# Patient Record
Sex: Female | Born: 1950 | Race: Black or African American | Hispanic: No | Smoking: Former smoker
Health system: Southern US, Community
[De-identification: ages and names within clinical notes are randomized; demographics above are authoritative.]

## PROBLEM LIST (undated history)

## (undated) DIAGNOSIS — E039 Hypothyroidism, unspecified: Secondary | ICD-10-CM

## (undated) DIAGNOSIS — Z9484 Stem cells transplant status: Secondary | ICD-10-CM

## (undated) DIAGNOSIS — T451X5A Adverse effect of antineoplastic and immunosuppressive drugs, initial encounter: Secondary | ICD-10-CM

## (undated) DIAGNOSIS — Z8489 Family history of other specified conditions: Secondary | ICD-10-CM

## (undated) DIAGNOSIS — J189 Pneumonia, unspecified organism: Secondary | ICD-10-CM

## (undated) DIAGNOSIS — K219 Gastro-esophageal reflux disease without esophagitis: Secondary | ICD-10-CM

## (undated) DIAGNOSIS — Z828 Family history of other disabilities and chronic diseases leading to disablement, not elsewhere classified: Secondary | ICD-10-CM

## (undated) DIAGNOSIS — C7951 Secondary malignant neoplasm of bone: Secondary | ICD-10-CM

## (undated) DIAGNOSIS — D6481 Anemia due to antineoplastic chemotherapy: Secondary | ICD-10-CM

## (undated) DIAGNOSIS — IMO0002 Reserved for concepts with insufficient information to code with codable children: Secondary | ICD-10-CM

## (undated) DIAGNOSIS — Z923 Personal history of irradiation: Secondary | ICD-10-CM

## (undated) DIAGNOSIS — E7800 Pure hypercholesterolemia, unspecified: Secondary | ICD-10-CM

## (undated) DIAGNOSIS — I639 Cerebral infarction, unspecified: Secondary | ICD-10-CM

## (undated) DIAGNOSIS — Z9889 Other specified postprocedural states: Secondary | ICD-10-CM

## (undated) DIAGNOSIS — C9000 Multiple myeloma not having achieved remission: Secondary | ICD-10-CM

## (undated) DIAGNOSIS — R112 Nausea with vomiting, unspecified: Secondary | ICD-10-CM

## (undated) DIAGNOSIS — G4700 Insomnia, unspecified: Secondary | ICD-10-CM

## (undated) HISTORY — DX: Adverse effect of antineoplastic and immunosuppressive drugs, initial encounter: T45.1X5A

## (undated) HISTORY — DX: Gastro-esophageal reflux disease without esophagitis: K21.9

## (undated) HISTORY — DX: Insomnia, unspecified: G47.00

## (undated) HISTORY — DX: Reserved for concepts with insufficient information to code with codable children: IMO0002

## (undated) HISTORY — DX: Personal history of irradiation: Z92.3

## (undated) HISTORY — DX: Family history of other disabilities and chronic diseases leading to disablement, not elsewhere classified: Z82.8

## (undated) HISTORY — DX: Pure hypercholesterolemia, unspecified: E78.00

## (undated) HISTORY — DX: Family history of other specified conditions: Z84.89

## (undated) HISTORY — DX: Anemia due to antineoplastic chemotherapy: D64.81

## (undated) HISTORY — DX: Hypothyroidism, unspecified: E03.9

## (undated) HISTORY — DX: Stem cells transplant status: Z94.84

## (undated) HISTORY — PX: KNEE ARTHROSCOPY: SHX127

---

## 1979-03-02 HISTORY — PX: VAGINAL HYSTERECTOMY: SUR661

## 1979-03-02 HISTORY — PX: CHOLECYSTECTOMY: SHX55

## 1997-08-03 ENCOUNTER — Ambulatory Visit (HOSPITAL_COMMUNITY): Admission: RE | Admit: 1997-08-03 | Discharge: 1997-08-03 | Payer: Self-pay | Admitting: *Deleted

## 1998-01-05 ENCOUNTER — Ambulatory Visit (HOSPITAL_BASED_OUTPATIENT_CLINIC_OR_DEPARTMENT_OTHER): Admission: RE | Admit: 1998-01-05 | Discharge: 1998-01-05 | Payer: Self-pay | Admitting: Orthopaedic Surgery

## 1998-04-04 ENCOUNTER — Ambulatory Visit (HOSPITAL_COMMUNITY): Admission: RE | Admit: 1998-04-04 | Discharge: 1998-04-04 | Payer: Self-pay | Admitting: *Deleted

## 1998-09-14 ENCOUNTER — Other Ambulatory Visit: Admission: RE | Admit: 1998-09-14 | Discharge: 1998-09-14 | Payer: Self-pay | Admitting: *Deleted

## 1998-11-09 ENCOUNTER — Ambulatory Visit (HOSPITAL_COMMUNITY): Admission: RE | Admit: 1998-11-09 | Discharge: 1998-11-09 | Payer: Self-pay | Admitting: *Deleted

## 1998-12-14 ENCOUNTER — Ambulatory Visit (HOSPITAL_BASED_OUTPATIENT_CLINIC_OR_DEPARTMENT_OTHER): Admission: RE | Admit: 1998-12-14 | Discharge: 1998-12-14 | Payer: Self-pay | Admitting: Orthopaedic Surgery

## 1999-11-27 ENCOUNTER — Ambulatory Visit (HOSPITAL_COMMUNITY): Admission: RE | Admit: 1999-11-27 | Discharge: 1999-11-27 | Payer: Self-pay | Admitting: *Deleted

## 1999-12-23 ENCOUNTER — Emergency Department (HOSPITAL_COMMUNITY): Admission: EM | Admit: 1999-12-23 | Discharge: 1999-12-23 | Payer: Self-pay | Admitting: Emergency Medicine

## 2000-08-20 ENCOUNTER — Other Ambulatory Visit: Admission: RE | Admit: 2000-08-20 | Discharge: 2000-08-20 | Payer: Self-pay | Admitting: Internal Medicine

## 2000-08-21 ENCOUNTER — Encounter: Admission: RE | Admit: 2000-08-21 | Discharge: 2000-08-21 | Payer: Self-pay | Admitting: Internal Medicine

## 2000-08-21 ENCOUNTER — Encounter: Payer: Self-pay | Admitting: Internal Medicine

## 2000-09-29 ENCOUNTER — Encounter: Payer: Self-pay | Admitting: Internal Medicine

## 2000-09-29 ENCOUNTER — Ambulatory Visit (HOSPITAL_COMMUNITY): Admission: RE | Admit: 2000-09-29 | Discharge: 2000-09-29 | Payer: Self-pay | Admitting: Internal Medicine

## 2001-03-11 ENCOUNTER — Encounter: Admission: RE | Admit: 2001-03-11 | Discharge: 2001-03-11 | Payer: Self-pay | Admitting: Internal Medicine

## 2001-03-24 ENCOUNTER — Ambulatory Visit (HOSPITAL_BASED_OUTPATIENT_CLINIC_OR_DEPARTMENT_OTHER): Admission: RE | Admit: 2001-03-24 | Discharge: 2001-03-24 | Payer: Self-pay | Admitting: Orthopedic Surgery

## 2001-10-01 ENCOUNTER — Encounter: Payer: Self-pay | Admitting: Internal Medicine

## 2001-10-01 ENCOUNTER — Ambulatory Visit (HOSPITAL_COMMUNITY): Admission: RE | Admit: 2001-10-01 | Discharge: 2001-10-01 | Payer: Self-pay | Admitting: Internal Medicine

## 2001-10-10 ENCOUNTER — Emergency Department (HOSPITAL_COMMUNITY): Admission: EM | Admit: 2001-10-10 | Discharge: 2001-10-10 | Payer: Self-pay | Admitting: Emergency Medicine

## 2001-10-10 ENCOUNTER — Encounter: Payer: Self-pay | Admitting: Emergency Medicine

## 2002-11-30 ENCOUNTER — Ambulatory Visit (HOSPITAL_COMMUNITY): Admission: RE | Admit: 2002-11-30 | Discharge: 2002-11-30 | Payer: Self-pay | Admitting: Internal Medicine

## 2002-11-30 ENCOUNTER — Encounter: Payer: Self-pay | Admitting: Internal Medicine

## 2003-11-29 ENCOUNTER — Other Ambulatory Visit: Admission: RE | Admit: 2003-11-29 | Discharge: 2003-11-29 | Payer: Self-pay | Admitting: Internal Medicine

## 2003-12-07 ENCOUNTER — Ambulatory Visit (HOSPITAL_COMMUNITY): Admission: RE | Admit: 2003-12-07 | Discharge: 2003-12-07 | Payer: Self-pay | Admitting: Internal Medicine

## 2005-04-17 ENCOUNTER — Encounter (INDEPENDENT_AMBULATORY_CARE_PROVIDER_SITE_OTHER): Payer: Self-pay | Admitting: *Deleted

## 2005-04-17 ENCOUNTER — Ambulatory Visit (HOSPITAL_COMMUNITY): Admission: RE | Admit: 2005-04-17 | Discharge: 2005-04-17 | Payer: Self-pay | Admitting: Gastroenterology

## 2005-05-28 ENCOUNTER — Ambulatory Visit (HOSPITAL_COMMUNITY): Admission: RE | Admit: 2005-05-28 | Discharge: 2005-05-28 | Payer: Self-pay | Admitting: Internal Medicine

## 2005-08-24 ENCOUNTER — Emergency Department (HOSPITAL_COMMUNITY): Admission: EM | Admit: 2005-08-24 | Discharge: 2005-08-24 | Payer: Self-pay | Admitting: Family Medicine

## 2006-06-06 ENCOUNTER — Ambulatory Visit (HOSPITAL_COMMUNITY): Admission: RE | Admit: 2006-06-06 | Discharge: 2006-06-06 | Payer: Self-pay | Admitting: Internal Medicine

## 2006-09-22 ENCOUNTER — Ambulatory Visit (HOSPITAL_COMMUNITY): Admission: RE | Admit: 2006-09-22 | Discharge: 2006-09-22 | Payer: Self-pay | Admitting: Internal Medicine

## 2007-01-12 ENCOUNTER — Other Ambulatory Visit: Admission: RE | Admit: 2007-01-12 | Discharge: 2007-01-12 | Payer: Self-pay | Admitting: Internal Medicine

## 2007-02-08 ENCOUNTER — Emergency Department (HOSPITAL_COMMUNITY): Admission: EM | Admit: 2007-02-08 | Discharge: 2007-02-08 | Payer: Self-pay | Admitting: Emergency Medicine

## 2007-03-07 ENCOUNTER — Emergency Department (HOSPITAL_COMMUNITY): Admission: EM | Admit: 2007-03-07 | Discharge: 2007-03-07 | Payer: Self-pay | Admitting: Emergency Medicine

## 2007-04-08 DIAGNOSIS — IMO0002 Reserved for concepts with insufficient information to code with codable children: Secondary | ICD-10-CM

## 2007-04-08 HISTORY — DX: Reserved for concepts with insufficient information to code with codable children: IMO0002

## 2007-04-12 ENCOUNTER — Emergency Department (HOSPITAL_COMMUNITY): Admission: EM | Admit: 2007-04-12 | Discharge: 2007-04-12 | Payer: Self-pay | Admitting: Emergency Medicine

## 2007-04-12 ENCOUNTER — Emergency Department (HOSPITAL_COMMUNITY): Admission: EM | Admit: 2007-04-12 | Discharge: 2007-04-13 | Payer: Self-pay | Admitting: Emergency Medicine

## 2007-04-14 ENCOUNTER — Ambulatory Visit: Payer: Self-pay | Admitting: Internal Medicine

## 2007-04-15 LAB — MORPHOLOGY

## 2007-04-15 LAB — CBC WITH DIFFERENTIAL/PLATELET
BASO%: 0.5 % (ref 0.0–2.0)
Basophils Absolute: 0 10*3/uL (ref 0.0–0.1)
EOS%: 1 % (ref 0.0–7.0)
HGB: 12 g/dL (ref 11.6–15.9)
MCH: 32.5 pg (ref 26.0–34.0)
MCHC: 35.4 g/dL (ref 32.0–36.0)
MCV: 91.8 fL (ref 81.0–101.0)
MONO%: 8.5 % (ref 0.0–13.0)
NEUT%: 48.5 % (ref 39.6–76.8)
RDW: 14.2 % (ref 11.3–14.5)

## 2007-04-17 LAB — KAPPA/LAMBDA LIGHT CHAINS: Lambda Free Lght Chn: 14.7 mg/dL — ABNORMAL HIGH (ref 0.57–2.63)

## 2007-04-17 LAB — COMPREHENSIVE METABOLIC PANEL
Alkaline Phosphatase: 49 U/L (ref 39–117)
BUN: 13 mg/dL (ref 6–23)
CO2: 22 mEq/L (ref 19–32)
Creatinine, Ser: 0.63 mg/dL (ref 0.40–1.20)
Glucose, Bld: 93 mg/dL (ref 70–99)
Total Bilirubin: 1 mg/dL (ref 0.3–1.2)
Total Protein: 8.2 g/dL (ref 6.0–8.3)

## 2007-04-17 LAB — IMMUNOFIXATION ELECTROPHORESIS
IgA: 171 mg/dL (ref 68–378)
IgG (Immunoglobin G), Serum: 3300 mg/dL — ABNORMAL HIGH (ref 694–1618)
IgM, Serum: 24 mg/dL — ABNORMAL LOW (ref 60–263)
Total Protein, Serum Electrophoresis: 8.2 g/dL (ref 6.0–8.3)

## 2007-04-17 LAB — LACTATE DEHYDROGENASE: LDH: 117 U/L (ref 94–250)

## 2007-04-23 ENCOUNTER — Ambulatory Visit: Admission: RE | Admit: 2007-04-23 | Discharge: 2007-06-09 | Payer: Self-pay | Admitting: Internal Medicine

## 2007-04-27 ENCOUNTER — Encounter: Payer: Self-pay | Admitting: Internal Medicine

## 2007-04-27 ENCOUNTER — Ambulatory Visit (HOSPITAL_COMMUNITY): Admission: RE | Admit: 2007-04-27 | Discharge: 2007-04-27 | Payer: Self-pay | Admitting: Internal Medicine

## 2007-04-27 ENCOUNTER — Ambulatory Visit: Payer: Self-pay | Admitting: Internal Medicine

## 2007-05-07 LAB — COMPREHENSIVE METABOLIC PANEL
Albumin: 3.4 g/dL — ABNORMAL LOW (ref 3.5–5.2)
Alkaline Phosphatase: 117 U/L (ref 39–117)
BUN: 22 mg/dL (ref 6–23)
Calcium: 8.9 mg/dL (ref 8.4–10.5)
Chloride: 105 mEq/L (ref 96–112)
Creatinine, Ser: 0.72 mg/dL (ref 0.40–1.20)
Glucose, Bld: 196 mg/dL — ABNORMAL HIGH (ref 70–99)
Potassium: 4.1 mEq/L (ref 3.5–5.3)

## 2007-05-07 LAB — CBC WITH DIFFERENTIAL/PLATELET
Basophils Absolute: 0 10*3/uL (ref 0.0–0.1)
Eosinophils Absolute: 0 10*3/uL (ref 0.0–0.5)
HCT: 37.2 % (ref 34.8–46.6)
HGB: 13 g/dL (ref 11.6–15.9)
MCV: 93.8 fL (ref 81.0–101.0)
MONO%: 4.2 % (ref 0.0–13.0)
NEUT#: 18.5 10*3/uL — ABNORMAL HIGH (ref 1.5–6.5)
NEUT%: 93.5 % — ABNORMAL HIGH (ref 39.6–76.8)
RDW: 15.2 % — ABNORMAL HIGH (ref 11.3–14.5)

## 2007-05-19 LAB — CBC WITH DIFFERENTIAL/PLATELET
Eosinophils Absolute: 0.1 10*3/uL (ref 0.0–0.5)
MONO#: 0.2 10*3/uL (ref 0.1–0.9)
NEUT#: 2.5 10*3/uL (ref 1.5–6.5)
RBC: 4.18 10*6/uL (ref 3.70–5.32)
RDW: 15.6 % — ABNORMAL HIGH (ref 11.3–14.5)
WBC: 3.3 10*3/uL — ABNORMAL LOW (ref 3.9–10.0)
lymph#: 0.6 10*3/uL — ABNORMAL LOW (ref 0.9–3.3)

## 2007-05-19 LAB — COMPREHENSIVE METABOLIC PANEL
AST: 15 U/L (ref 0–37)
Albumin: 2.7 g/dL — ABNORMAL LOW (ref 3.5–5.2)
Alkaline Phosphatase: 57 U/L (ref 39–117)
BUN: 7 mg/dL (ref 6–23)
Potassium: 3.6 mEq/L (ref 3.5–5.3)
Sodium: 132 mEq/L — ABNORMAL LOW (ref 135–145)
Total Bilirubin: 1.2 mg/dL (ref 0.3–1.2)

## 2007-05-27 ENCOUNTER — Ambulatory Visit: Payer: Self-pay | Admitting: Internal Medicine

## 2007-05-27 LAB — CBC WITH DIFFERENTIAL/PLATELET
Basophils Absolute: 0 10*3/uL (ref 0.0–0.1)
EOS%: 0 % (ref 0.0–7.0)
Eosinophils Absolute: 0 10*3/uL (ref 0.0–0.5)
HGB: 11.9 g/dL (ref 11.6–15.9)
LYMPH%: 22.8 % (ref 14.0–48.0)
MCH: 32.8 pg (ref 26.0–34.0)
MCV: 93.2 fL (ref 81.0–101.0)
MONO%: 1.7 % (ref 0.0–13.0)
NEUT%: 75.3 % (ref 39.6–76.8)
Platelets: 314 10*3/uL (ref 145–400)
RDW: 14.6 % — ABNORMAL HIGH (ref 11.3–14.5)

## 2007-05-27 LAB — COMPREHENSIVE METABOLIC PANEL
AST: 16 U/L (ref 0–37)
Alkaline Phosphatase: 76 U/L (ref 39–117)
BUN: 9 mg/dL (ref 6–23)
Creatinine, Ser: 0.62 mg/dL (ref 0.40–1.20)
Glucose, Bld: 152 mg/dL — ABNORMAL HIGH (ref 70–99)
Potassium: 3.9 mEq/L (ref 3.5–5.3)
Total Bilirubin: 0.4 mg/dL (ref 0.3–1.2)

## 2007-06-04 LAB — COMPREHENSIVE METABOLIC PANEL
Albumin: 3.8 g/dL (ref 3.5–5.2)
Alkaline Phosphatase: 78 U/L (ref 39–117)
BUN: 11 mg/dL (ref 6–23)
CO2: 19 mEq/L (ref 19–32)
Glucose, Bld: 142 mg/dL — ABNORMAL HIGH (ref 70–99)
Sodium: 140 mEq/L (ref 135–145)
Total Bilirubin: 0.5 mg/dL (ref 0.3–1.2)
Total Protein: 6.6 g/dL (ref 6.0–8.3)

## 2007-06-04 LAB — CBC WITH DIFFERENTIAL/PLATELET
Basophils Absolute: 0.1 10*3/uL (ref 0.0–0.1)
Eosinophils Absolute: 0 10*3/uL (ref 0.0–0.5)
HCT: 31.7 % — ABNORMAL LOW (ref 34.8–46.6)
HGB: 11.3 g/dL — ABNORMAL LOW (ref 11.6–15.9)
LYMPH%: 22.8 % (ref 14.0–48.0)
MCV: 93.5 fL (ref 81.0–101.0)
MONO#: 0.7 10*3/uL (ref 0.1–0.9)
MONO%: 8.1 % (ref 0.0–13.0)
NEUT#: 6.3 10*3/uL (ref 1.5–6.5)
Platelets: 592 10*3/uL — ABNORMAL HIGH (ref 145–400)
WBC: 9.3 10*3/uL (ref 3.9–10.0)

## 2007-06-10 LAB — CBC WITH DIFFERENTIAL/PLATELET
Basophils Absolute: 0 10*3/uL (ref 0.0–0.1)
Eosinophils Absolute: 0 10*3/uL (ref 0.0–0.5)
HCT: 31.4 % — ABNORMAL LOW (ref 34.8–46.6)
HGB: 11 g/dL — ABNORMAL LOW (ref 11.6–15.9)
MCH: 32.8 pg (ref 26.0–34.0)
MCV: 93.3 fL (ref 81.0–101.0)
NEUT#: 5.3 10*3/uL (ref 1.5–6.5)
NEUT%: 68.9 % (ref 39.6–76.8)
RDW: 14.6 % — ABNORMAL HIGH (ref 11.3–14.5)
lymph#: 1.9 10*3/uL (ref 0.9–3.3)

## 2007-06-10 LAB — COMPREHENSIVE METABOLIC PANEL
Albumin: 3.3 g/dL — ABNORMAL LOW (ref 3.5–5.2)
BUN: 9 mg/dL (ref 6–23)
Calcium: 8.5 mg/dL (ref 8.4–10.5)
Chloride: 110 mEq/L (ref 96–112)
Creatinine, Ser: 0.66 mg/dL (ref 0.40–1.20)
Glucose, Bld: 134 mg/dL — ABNORMAL HIGH (ref 70–99)
Potassium: 3.9 mEq/L (ref 3.5–5.3)

## 2007-06-17 LAB — CBC WITH DIFFERENTIAL/PLATELET
Basophils Absolute: 0 10*3/uL (ref 0.0–0.1)
EOS%: 3.3 % (ref 0.0–7.0)
Eosinophils Absolute: 0.2 10*3/uL (ref 0.0–0.5)
HCT: 35.1 % (ref 34.8–46.6)
HGB: 12.4 g/dL (ref 11.6–15.9)
MONO#: 0.3 10*3/uL (ref 0.1–0.9)
NEUT#: 4.4 10*3/uL (ref 1.5–6.5)
RDW: 15.1 % — ABNORMAL HIGH (ref 11.3–14.5)
WBC: 6.7 10*3/uL (ref 3.9–10.0)
lymph#: 1.7 10*3/uL (ref 0.9–3.3)

## 2007-06-17 LAB — COMPREHENSIVE METABOLIC PANEL
AST: 9 U/L (ref 0–37)
Albumin: 3.8 g/dL (ref 3.5–5.2)
BUN: 12 mg/dL (ref 6–23)
CO2: 22 mEq/L (ref 19–32)
Calcium: 8.9 mg/dL (ref 8.4–10.5)
Chloride: 105 mEq/L (ref 96–112)
Glucose, Bld: 108 mg/dL — ABNORMAL HIGH (ref 70–99)
Potassium: 3.6 mEq/L (ref 3.5–5.3)

## 2007-06-17 LAB — LACTATE DEHYDROGENASE: LDH: 156 U/L (ref 94–250)

## 2007-06-23 LAB — CBC WITH DIFFERENTIAL/PLATELET
Eosinophils Absolute: 0.5 10*3/uL (ref 0.0–0.5)
MCV: 93.5 fL (ref 81.0–101.0)
MONO#: 0.6 10*3/uL (ref 0.1–0.9)
MONO%: 8.7 % (ref 0.0–13.0)
NEUT#: 4.2 10*3/uL (ref 1.5–6.5)
RBC: 3.62 10*6/uL — ABNORMAL LOW (ref 3.70–5.32)
RDW: 14.8 % — ABNORMAL HIGH (ref 11.3–14.5)
WBC: 7.1 10*3/uL (ref 3.9–10.0)
lymph#: 1.8 10*3/uL (ref 0.9–3.3)

## 2007-06-23 LAB — COMPREHENSIVE METABOLIC PANEL
Albumin: 3.6 g/dL (ref 3.5–5.2)
Alkaline Phosphatase: 78 U/L (ref 39–117)
Glucose, Bld: 113 mg/dL — ABNORMAL HIGH (ref 70–99)
Potassium: 3.5 mEq/L (ref 3.5–5.3)
Sodium: 143 mEq/L (ref 135–145)
Total Protein: 6.2 g/dL (ref 6.0–8.3)

## 2007-06-24 LAB — KAPPA/LAMBDA LIGHT CHAINS
Kappa free light chain: 0.87 mg/dL (ref 0.33–1.94)
Lambda Free Lght Chn: 4.56 mg/dL — ABNORMAL HIGH (ref 0.57–2.63)

## 2007-06-24 LAB — IGG, IGA, IGM: IgG (Immunoglobin G), Serum: 1160 mg/dL (ref 694–1618)

## 2007-07-01 ENCOUNTER — Ambulatory Visit: Payer: Self-pay | Admitting: Internal Medicine

## 2007-07-02 HISTORY — PX: PORTACATH PLACEMENT: SHX2246

## 2007-07-03 ENCOUNTER — Ambulatory Visit (HOSPITAL_COMMUNITY): Admission: RE | Admit: 2007-07-03 | Discharge: 2007-07-03 | Payer: Self-pay | Admitting: Internal Medicine

## 2007-07-07 LAB — CBC WITH DIFFERENTIAL/PLATELET
BASO%: 0.8 % (ref 0.0–2.0)
Basophils Absolute: 0 10*3/uL (ref 0.0–0.1)
MCH: 32.5 pg (ref 26.0–34.0)
MCV: 92.8 fL (ref 81.0–101.0)
MONO#: 0.4 10*3/uL (ref 0.1–0.9)
NEUT#: 2.8 10*3/uL (ref 1.5–6.5)
NEUT%: 56.4 % (ref 39.6–76.8)
Platelets: 267 10*3/uL (ref 145–400)
RBC: 3.53 10*6/uL — ABNORMAL LOW (ref 3.70–5.32)
lymph#: 1.6 10*3/uL (ref 0.9–3.3)

## 2007-07-07 LAB — COMPREHENSIVE METABOLIC PANEL
ALT: 12 U/L (ref 0–35)
Albumin: 3.7 g/dL (ref 3.5–5.2)
CO2: 22 mEq/L (ref 19–32)
Calcium: 9.1 mg/dL (ref 8.4–10.5)
Chloride: 107 mEq/L (ref 96–112)
Creatinine, Ser: 0.64 mg/dL (ref 0.40–1.20)
Potassium: 3.8 mEq/L (ref 3.5–5.3)

## 2007-07-07 LAB — LACTATE DEHYDROGENASE: LDH: 143 U/L (ref 94–250)

## 2007-08-04 LAB — COMPREHENSIVE METABOLIC PANEL
AST: 8 U/L (ref 0–37)
Albumin: 3.7 g/dL (ref 3.5–5.2)
Alkaline Phosphatase: 69 U/L (ref 39–117)
BUN: 10 mg/dL (ref 6–23)
Calcium: 8.9 mg/dL (ref 8.4–10.5)
Chloride: 108 mEq/L (ref 96–112)
Glucose, Bld: 123 mg/dL — ABNORMAL HIGH (ref 70–99)
Potassium: 3.7 mEq/L (ref 3.5–5.3)
Sodium: 140 mEq/L (ref 135–145)
Total Protein: 6.2 g/dL (ref 6.0–8.3)

## 2007-08-04 LAB — CBC WITH DIFFERENTIAL/PLATELET
Basophils Absolute: 0 10*3/uL (ref 0.0–0.1)
EOS%: 1.2 % (ref 0.0–7.0)
Eosinophils Absolute: 0.1 10*3/uL (ref 0.0–0.5)
HGB: 11.6 g/dL (ref 11.6–15.9)
MONO%: 7.5 % (ref 0.0–13.0)
NEUT#: 3.5 10*3/uL (ref 1.5–6.5)
RBC: 3.64 10*6/uL — ABNORMAL LOW (ref 3.70–5.32)
RDW: 15 % — ABNORMAL HIGH (ref 11.3–14.5)
lymph#: 1.7 10*3/uL (ref 0.9–3.3)

## 2007-08-25 ENCOUNTER — Ambulatory Visit: Payer: Self-pay | Admitting: Internal Medicine

## 2007-08-27 LAB — COMPREHENSIVE METABOLIC PANEL
AST: 14 U/L (ref 0–37)
Albumin: 3.9 g/dL (ref 3.5–5.2)
Alkaline Phosphatase: 81 U/L (ref 39–117)
BUN: 11 mg/dL (ref 6–23)
Creatinine, Ser: 0.69 mg/dL (ref 0.40–1.20)
Glucose, Bld: 165 mg/dL — ABNORMAL HIGH (ref 70–99)
Potassium: 3.3 mEq/L — ABNORMAL LOW (ref 3.5–5.3)
Total Bilirubin: 0.7 mg/dL (ref 0.3–1.2)

## 2007-08-27 LAB — CBC WITH DIFFERENTIAL/PLATELET
BASO%: 0.1 % (ref 0.0–2.0)
EOS%: 0.2 % (ref 0.0–7.0)
LYMPH%: 12.6 % — ABNORMAL LOW (ref 14.0–48.0)
MCH: 32.6 pg (ref 26.0–34.0)
MCHC: 35.1 g/dL (ref 32.0–36.0)
MCV: 92.8 fL (ref 81.0–101.0)
MONO%: 3.8 % (ref 0.0–13.0)
NEUT#: 5.8 10*3/uL (ref 1.5–6.5)
Platelets: 249 10*3/uL (ref 145–400)
RBC: 3.5 10*6/uL — ABNORMAL LOW (ref 3.70–5.32)
RDW: 15.1 % — ABNORMAL HIGH (ref 11.3–14.5)

## 2007-08-27 LAB — KAPPA/LAMBDA LIGHT CHAINS
Kappa:Lambda Ratio: 0.21 — ABNORMAL LOW (ref 0.26–1.65)
Lambda Free Lght Chn: 3.68 mg/dL — ABNORMAL HIGH (ref 0.57–2.63)

## 2007-08-27 LAB — IGG, IGA, IGM
IgA: 193 mg/dL (ref 68–378)
IgG (Immunoglobin G), Serum: 1070 mg/dL (ref 694–1618)
IgM, Serum: 17 mg/dL — ABNORMAL LOW (ref 60–263)

## 2007-08-28 ENCOUNTER — Ambulatory Visit (HOSPITAL_COMMUNITY): Admission: RE | Admit: 2007-08-28 | Discharge: 2007-08-28 | Payer: Self-pay | Admitting: Internal Medicine

## 2007-08-28 ENCOUNTER — Ambulatory Visit: Payer: Self-pay | Admitting: Internal Medicine

## 2007-08-28 ENCOUNTER — Encounter: Payer: Self-pay | Admitting: Internal Medicine

## 2007-10-02 LAB — COMPREHENSIVE METABOLIC PANEL
ALT: 36 U/L — ABNORMAL HIGH (ref 0–35)
CO2: 23 mEq/L (ref 19–32)
Calcium: 8.9 mg/dL (ref 8.4–10.5)
Chloride: 112 mEq/L (ref 96–112)
Creatinine, Ser: 0.85 mg/dL (ref 0.40–1.20)
Glucose, Bld: 122 mg/dL — ABNORMAL HIGH (ref 70–99)
Total Protein: 6.1 g/dL (ref 6.0–8.3)

## 2007-10-02 LAB — CBC WITH DIFFERENTIAL/PLATELET
BASO%: 0.2 % (ref 0.0–2.0)
Eosinophils Absolute: 0 10*3/uL (ref 0.0–0.5)
HCT: 32.5 % — ABNORMAL LOW (ref 34.8–46.6)
HGB: 11.3 g/dL — ABNORMAL LOW (ref 11.6–15.9)
LYMPH%: 30.2 % (ref 14.0–48.0)
MCHC: 34.9 g/dL (ref 32.0–36.0)
MONO#: 0.7 10*3/uL (ref 0.1–0.9)
NEUT#: 3.8 10*3/uL (ref 1.5–6.5)
NEUT%: 58.8 % (ref 39.6–76.8)
Platelets: 285 10*3/uL (ref 145–400)
WBC: 6.5 10*3/uL (ref 3.9–10.0)
lymph#: 2 10*3/uL (ref 0.9–3.3)

## 2007-10-02 LAB — LACTATE DEHYDROGENASE: LDH: 123 U/L (ref 94–250)

## 2007-10-06 ENCOUNTER — Encounter: Admission: RE | Admit: 2007-10-06 | Discharge: 2007-10-06 | Payer: Self-pay | Admitting: Otolaryngology

## 2007-10-06 ENCOUNTER — Encounter (INDEPENDENT_AMBULATORY_CARE_PROVIDER_SITE_OTHER): Payer: Self-pay | Admitting: *Deleted

## 2007-10-09 ENCOUNTER — Ambulatory Visit: Payer: Self-pay | Admitting: Internal Medicine

## 2007-10-26 ENCOUNTER — Ambulatory Visit: Payer: Self-pay | Admitting: Internal Medicine

## 2007-10-26 LAB — COMPREHENSIVE METABOLIC PANEL
ALT: 29 U/L (ref 0–35)
AST: 11 U/L (ref 0–37)
Albumin: 3.8 g/dL (ref 3.5–5.2)
Calcium: 9.1 mg/dL (ref 8.4–10.5)
Chloride: 107 mEq/L (ref 96–112)
Potassium: 3.2 mEq/L — ABNORMAL LOW (ref 3.5–5.3)
Sodium: 142 mEq/L (ref 135–145)
Total Protein: 6.1 g/dL (ref 6.0–8.3)

## 2007-10-26 LAB — CBC WITH DIFFERENTIAL/PLATELET
BASO%: 0.2 % (ref 0.0–2.0)
EOS%: 0.7 % (ref 0.0–7.0)
HGB: 10.9 g/dL — ABNORMAL LOW (ref 11.6–15.9)
MCH: 32.7 pg (ref 26.0–34.0)
MCHC: 35.4 g/dL (ref 32.0–36.0)
MONO#: 0 10*3/uL — ABNORMAL LOW (ref 0.1–0.9)
RDW: 15.6 % — ABNORMAL HIGH (ref 11.3–14.5)
WBC: 8.3 10*3/uL (ref 3.9–10.0)
lymph#: 1.2 10*3/uL (ref 0.9–3.3)

## 2007-10-28 LAB — CBC WITH DIFFERENTIAL/PLATELET
BASO%: 0.4 % (ref 0.0–2.0)
EOS%: 2.1 % (ref 0.0–7.0)
HCT: 31 % — ABNORMAL LOW (ref 34.8–46.6)
HGB: 10.9 g/dL — ABNORMAL LOW (ref 11.6–15.9)
MONO%: 1.5 % (ref 0.0–13.0)
NEUT#: 0.5 10*3/uL — ABNORMAL LOW (ref 1.5–6.5)
Platelets: 177 10*3/uL (ref 145–400)
WBC: 1.4 10*3/uL — ABNORMAL LOW (ref 3.9–10.0)

## 2007-11-20 DIAGNOSIS — Z9484 Stem cells transplant status: Secondary | ICD-10-CM

## 2007-11-20 HISTORY — DX: Stem cells transplant status: Z94.84

## 2007-12-18 ENCOUNTER — Ambulatory Visit: Payer: Self-pay | Admitting: Internal Medicine

## 2008-01-05 LAB — CBC WITH DIFFERENTIAL/PLATELET
Basophils Absolute: 0.1 10*3/uL (ref 0.0–0.1)
EOS%: 4.4 % (ref 0.0–7.0)
Eosinophils Absolute: 0.2 10*3/uL (ref 0.0–0.5)
HCT: 32.4 % — ABNORMAL LOW (ref 34.8–46.6)
HGB: 11.2 g/dL — ABNORMAL LOW (ref 11.6–15.9)
MCH: 31.8 pg (ref 26.0–34.0)
MCV: 92.4 fL (ref 81.0–101.0)
MONO%: 11.5 % (ref 0.0–13.0)
NEUT#: 1.8 10*3/uL (ref 1.5–6.5)
NEUT%: 32 % — ABNORMAL LOW (ref 39.6–76.8)
lymph#: 2.8 10*3/uL (ref 0.9–3.3)

## 2008-01-05 LAB — LACTATE DEHYDROGENASE: LDH: 180 U/L (ref 94–250)

## 2008-01-05 LAB — COMPREHENSIVE METABOLIC PANEL
AST: 36 U/L (ref 0–37)
Albumin: 3.9 g/dL (ref 3.5–5.2)
BUN: 7 mg/dL (ref 6–23)
Calcium: 9 mg/dL (ref 8.4–10.5)
Chloride: 108 mEq/L (ref 96–112)
Creatinine, Ser: 0.8 mg/dL (ref 0.40–1.20)
Glucose, Bld: 105 mg/dL — ABNORMAL HIGH (ref 70–99)
Potassium: 4 mEq/L (ref 3.5–5.3)

## 2008-01-19 ENCOUNTER — Ambulatory Visit: Payer: Self-pay | Admitting: Internal Medicine

## 2008-01-19 LAB — CBC WITH DIFFERENTIAL/PLATELET
Basophils Absolute: 0 10*3/uL (ref 0.0–0.1)
Eosinophils Absolute: 0.3 10*3/uL (ref 0.0–0.5)
HGB: 11.2 g/dL — ABNORMAL LOW (ref 11.6–15.9)
MCV: 93.6 fL (ref 81.0–101.0)
MONO#: 0.6 10*3/uL (ref 0.1–0.9)
MONO%: 8.9 % (ref 0.0–13.0)
NEUT#: 2.2 10*3/uL (ref 1.5–6.5)
RBC: 3.47 10*6/uL — ABNORMAL LOW (ref 3.70–5.32)
RDW: 16.5 % — ABNORMAL HIGH (ref 11.3–14.5)
WBC: 7.1 10*3/uL (ref 3.9–10.0)
lymph#: 3.9 10*3/uL — ABNORMAL HIGH (ref 0.9–3.3)

## 2008-01-19 LAB — COMPREHENSIVE METABOLIC PANEL
ALT: 44 U/L — ABNORMAL HIGH (ref 0–35)
Albumin: 4 g/dL (ref 3.5–5.2)
CO2: 23 mEq/L (ref 19–32)
Chloride: 110 mEq/L (ref 96–112)
Glucose, Bld: 101 mg/dL — ABNORMAL HIGH (ref 70–99)
Potassium: 4 mEq/L (ref 3.5–5.3)
Sodium: 141 mEq/L (ref 135–145)
Total Bilirubin: 1 mg/dL (ref 0.3–1.2)
Total Protein: 6.5 g/dL (ref 6.0–8.3)

## 2008-01-19 LAB — LACTATE DEHYDROGENASE: LDH: 172 U/L (ref 94–250)

## 2008-02-02 LAB — COMPREHENSIVE METABOLIC PANEL
ALT: 20 U/L (ref 0–35)
AST: 12 U/L (ref 0–37)
Calcium: 9.4 mg/dL (ref 8.4–10.5)
Chloride: 108 mEq/L (ref 96–112)
Creatinine, Ser: 0.74 mg/dL (ref 0.40–1.20)
Total Bilirubin: 0.5 mg/dL (ref 0.3–1.2)

## 2008-02-02 LAB — CBC WITH DIFFERENTIAL/PLATELET
BASO%: 0.2 % (ref 0.0–2.0)
Basophils Absolute: 0 10*3/uL (ref 0.0–0.1)
Eosinophils Absolute: 0.1 10*3/uL (ref 0.0–0.5)
HCT: 35.6 % (ref 34.8–46.6)
LYMPH%: 30.5 % (ref 14.0–48.0)
MCHC: 34.2 g/dL (ref 32.0–36.0)
MONO#: 0.8 10*3/uL (ref 0.1–0.9)
NEUT%: 60.6 % (ref 39.6–76.8)
Platelets: 236 10*3/uL (ref 145–400)
WBC: 10.2 10*3/uL — ABNORMAL HIGH (ref 3.9–10.0)

## 2008-02-15 LAB — COMPREHENSIVE METABOLIC PANEL
AST: 21 U/L (ref 0–37)
Alkaline Phosphatase: 94 U/L (ref 39–117)
BUN: 9 mg/dL (ref 6–23)
Calcium: 9.5 mg/dL (ref 8.4–10.5)
Creatinine, Ser: 0.83 mg/dL (ref 0.40–1.20)

## 2008-02-15 LAB — CBC WITH DIFFERENTIAL/PLATELET
Basophils Absolute: 0 10*3/uL (ref 0.0–0.1)
EOS%: 1.6 % (ref 0.0–7.0)
HCT: 33.4 % — ABNORMAL LOW (ref 34.8–46.6)
HGB: 11.7 g/dL (ref 11.6–15.9)
MCH: 33.6 pg (ref 26.0–34.0)
MCV: 95.9 fL (ref 81.0–101.0)
MONO%: 10.7 % (ref 0.0–13.0)
NEUT%: 45 % (ref 39.6–76.8)

## 2008-03-01 LAB — CBC WITH DIFFERENTIAL/PLATELET
BASO%: 0.3 % (ref 0.0–2.0)
EOS%: 1.2 % (ref 0.0–7.0)
HCT: 33.7 % — ABNORMAL LOW (ref 34.8–46.6)
MCH: 33.3 pg (ref 26.0–34.0)
MCHC: 34.9 g/dL (ref 32.0–36.0)
MCV: 95.2 fL (ref 81.0–101.0)
MONO%: 8.3 % (ref 0.0–13.0)
NEUT%: 41 % (ref 39.6–76.8)
RDW: 14.8 % — ABNORMAL HIGH (ref 11.3–14.5)
lymph#: 2.4 10*3/uL (ref 0.9–3.3)

## 2008-03-01 LAB — COMPREHENSIVE METABOLIC PANEL
ALT: 29 U/L (ref 0–35)
AST: 23 U/L (ref 0–37)
Alkaline Phosphatase: 88 U/L (ref 39–117)
Calcium: 9.4 mg/dL (ref 8.4–10.5)
Chloride: 111 mEq/L (ref 96–112)
Creatinine, Ser: 0.76 mg/dL (ref 0.40–1.20)

## 2008-03-10 ENCOUNTER — Ambulatory Visit: Payer: Self-pay | Admitting: Internal Medicine

## 2008-03-15 LAB — CBC WITH DIFFERENTIAL/PLATELET
Basophils Absolute: 0 10*3/uL (ref 0.0–0.1)
EOS%: 2.6 % (ref 0.0–7.0)
Eosinophils Absolute: 0.1 10*3/uL (ref 0.0–0.5)
HCT: 34.6 % — ABNORMAL LOW (ref 34.8–46.6)
HGB: 12.1 g/dL (ref 11.6–15.9)
LYMPH%: 26 % (ref 14.0–48.0)
MCH: 33.4 pg (ref 26.0–34.0)
MCV: 95.5 fL (ref 81.0–101.0)
MONO%: 9.4 % (ref 0.0–13.0)
NEUT#: 3.2 10*3/uL (ref 1.5–6.5)
NEUT%: 61.5 % (ref 39.6–76.8)
Platelets: 228 10*3/uL (ref 145–400)
RDW: 13.7 % (ref 11.3–14.5)

## 2008-03-18 LAB — COMPREHENSIVE METABOLIC PANEL
AST: 21 U/L (ref 0–37)
Alkaline Phosphatase: 76 U/L (ref 39–117)
BUN: 6 mg/dL (ref 6–23)
Calcium: 9.4 mg/dL (ref 8.4–10.5)
Chloride: 106 mEq/L (ref 96–112)
Creatinine, Ser: 0.76 mg/dL (ref 0.40–1.20)

## 2008-03-18 LAB — IMMUNOFIXATION ELECTROPHORESIS
IgA: 168 mg/dL (ref 68–378)
IgG (Immunoglobin G), Serum: 1220 mg/dL (ref 694–1618)

## 2008-03-18 LAB — BETA 2 MICROGLOBULIN, SERUM: Beta-2 Microglobulin: 1.85 mg/L — ABNORMAL HIGH (ref 1.01–1.73)

## 2008-04-06 ENCOUNTER — Ambulatory Visit (HOSPITAL_COMMUNITY): Admission: RE | Admit: 2008-04-06 | Discharge: 2008-04-06 | Payer: Self-pay | Admitting: Internal Medicine

## 2008-05-12 ENCOUNTER — Ambulatory Visit: Payer: Self-pay | Admitting: Internal Medicine

## 2008-05-23 LAB — CBC WITH DIFFERENTIAL/PLATELET
BASO%: 0.3 % (ref 0.0–2.0)
EOS%: 1.2 % (ref 0.0–7.0)
HCT: 34.5 % — ABNORMAL LOW (ref 34.8–46.6)
MCH: 33.4 pg (ref 26.0–34.0)
MCHC: 34.8 g/dL (ref 32.0–36.0)
MCV: 95.9 fL (ref 81.0–101.0)
MONO%: 10.1 % (ref 0.0–13.0)
NEUT%: 61.8 % (ref 39.6–76.8)
RDW: 14.1 % (ref 11.3–14.5)
lymph#: 1.4 10*3/uL (ref 0.9–3.3)

## 2008-05-24 LAB — COMPREHENSIVE METABOLIC PANEL
ALT: 26 U/L (ref 0–35)
AST: 19 U/L (ref 0–37)
Alkaline Phosphatase: 72 U/L (ref 39–117)
BUN: 12 mg/dL (ref 6–23)
Calcium: 9.4 mg/dL (ref 8.4–10.5)
Chloride: 111 mEq/L (ref 96–112)
Creatinine, Ser: 0.75 mg/dL (ref 0.40–1.20)

## 2008-05-24 LAB — IGG, IGA, IGM
IgA: 167 mg/dL (ref 68–378)
IgM, Serum: 43 mg/dL — ABNORMAL LOW (ref 60–263)

## 2008-05-24 LAB — KAPPA/LAMBDA LIGHT CHAINS
Kappa free light chain: 2.03 mg/dL — ABNORMAL HIGH (ref 0.33–1.94)
Kappa:Lambda Ratio: 0.2 — ABNORMAL LOW (ref 0.26–1.65)
Lambda Free Lght Chn: 10.2 mg/dL — ABNORMAL HIGH (ref 0.57–2.63)

## 2008-05-24 LAB — BETA 2 MICROGLOBULIN, SERUM: Beta-2 Microglobulin: 1.54 mg/L (ref 1.01–1.73)

## 2008-05-25 ENCOUNTER — Ambulatory Visit (HOSPITAL_COMMUNITY): Admission: RE | Admit: 2008-05-25 | Discharge: 2008-05-25 | Payer: Self-pay | Admitting: Internal Medicine

## 2008-05-25 ENCOUNTER — Encounter: Payer: Self-pay | Admitting: Internal Medicine

## 2008-05-25 ENCOUNTER — Ambulatory Visit: Payer: Self-pay | Admitting: Internal Medicine

## 2008-06-29 ENCOUNTER — Ambulatory Visit (HOSPITAL_COMMUNITY): Admission: RE | Admit: 2008-06-29 | Discharge: 2008-06-29 | Payer: Self-pay | Admitting: Internal Medicine

## 2008-06-29 ENCOUNTER — Ambulatory Visit: Payer: Self-pay | Admitting: Internal Medicine

## 2008-06-30 ENCOUNTER — Ambulatory Visit (HOSPITAL_COMMUNITY): Admission: RE | Admit: 2008-06-30 | Discharge: 2008-06-30 | Payer: Self-pay | Admitting: Internal Medicine

## 2008-07-04 LAB — COMPREHENSIVE METABOLIC PANEL
Albumin: 4.3 g/dL (ref 3.5–5.2)
CO2: 23 mEq/L (ref 19–32)
Calcium: 9.5 mg/dL (ref 8.4–10.5)
Glucose, Bld: 93 mg/dL (ref 70–99)
Potassium: 4.1 mEq/L (ref 3.5–5.3)
Sodium: 140 mEq/L (ref 135–145)
Total Protein: 6.9 g/dL (ref 6.0–8.3)

## 2008-07-04 LAB — CBC WITH DIFFERENTIAL/PLATELET
Eosinophils Absolute: 0.1 10*3/uL (ref 0.0–0.5)
HGB: 11.6 g/dL (ref 11.6–15.9)
MONO#: 0.4 10*3/uL (ref 0.1–0.9)
NEUT#: 3.1 10*3/uL (ref 1.5–6.5)
Platelets: 229 10*3/uL (ref 145–400)
RBC: 3.51 10*6/uL — ABNORMAL LOW (ref 3.70–5.32)
RDW: 14.1 % (ref 11.3–14.5)
WBC: 5.3 10*3/uL (ref 3.9–10.0)

## 2008-07-11 LAB — CBC WITH DIFFERENTIAL/PLATELET
Eosinophils Absolute: 0.1 10*3/uL (ref 0.0–0.5)
MONO#: 0.5 10*3/uL (ref 0.1–0.9)
MONO%: 9.2 % (ref 0.0–13.0)
NEUT#: 3 10*3/uL (ref 1.5–6.5)
RBC: 3.61 10*6/uL — ABNORMAL LOW (ref 3.70–5.32)
RDW: 13.6 % (ref 11.3–14.5)
WBC: 5.2 10*3/uL (ref 3.9–10.0)

## 2008-07-11 LAB — COMPREHENSIVE METABOLIC PANEL
ALT: 20 U/L (ref 0–35)
AST: 9 U/L (ref 0–37)
Albumin: 4 g/dL (ref 3.5–5.2)
Alkaline Phosphatase: 83 U/L (ref 39–117)
Calcium: 9.2 mg/dL (ref 8.4–10.5)
Chloride: 107 mEq/L (ref 96–112)
Potassium: 4 mEq/L (ref 3.5–5.3)

## 2008-07-13 ENCOUNTER — Ambulatory Visit: Payer: Self-pay | Admitting: Cardiology

## 2008-07-13 ENCOUNTER — Ambulatory Visit: Admission: RE | Admit: 2008-07-13 | Discharge: 2008-07-13 | Payer: Self-pay | Admitting: Internal Medicine

## 2008-07-13 ENCOUNTER — Encounter: Payer: Self-pay | Admitting: Internal Medicine

## 2008-08-01 LAB — CBC WITH DIFFERENTIAL/PLATELET
Basophils Absolute: 0 10*3/uL (ref 0.0–0.1)
EOS%: 0.6 % (ref 0.0–7.0)
Eosinophils Absolute: 0 10*3/uL (ref 0.0–0.5)
HGB: 12.5 g/dL (ref 11.6–15.9)
NEUT#: 1.9 10*3/uL (ref 1.5–6.5)
RDW: 13.8 % (ref 11.3–14.5)
lymph#: 1.3 10*3/uL (ref 0.9–3.3)

## 2008-08-01 LAB — COMPREHENSIVE METABOLIC PANEL
Albumin: 4 g/dL (ref 3.5–5.2)
BUN: 8 mg/dL (ref 6–23)
Calcium: 9.2 mg/dL (ref 8.4–10.5)
Chloride: 107 mEq/L (ref 96–112)
Glucose, Bld: 87 mg/dL (ref 70–99)
Potassium: 3.9 mEq/L (ref 3.5–5.3)

## 2008-08-11 ENCOUNTER — Ambulatory Visit: Payer: Self-pay | Admitting: Internal Medicine

## 2008-08-15 LAB — COMPREHENSIVE METABOLIC PANEL
Albumin: 3.9 g/dL (ref 3.5–5.2)
Alkaline Phosphatase: 94 U/L (ref 39–117)
BUN: 10 mg/dL (ref 6–23)
CO2: 22 mEq/L (ref 19–32)
Calcium: 9.3 mg/dL (ref 8.4–10.5)
Glucose, Bld: 115 mg/dL — ABNORMAL HIGH (ref 70–99)
Potassium: 3.8 mEq/L (ref 3.5–5.3)

## 2008-08-15 LAB — CBC WITH DIFFERENTIAL/PLATELET
Basophils Absolute: 0 10*3/uL (ref 0.0–0.1)
Eosinophils Absolute: 0 10*3/uL (ref 0.0–0.5)
HGB: 11.7 g/dL (ref 11.6–15.9)
MCV: 96.2 fL (ref 81.0–101.0)
MONO%: 18.1 % — ABNORMAL HIGH (ref 0.0–13.0)
NEUT#: 1.7 10*3/uL (ref 1.5–6.5)
Platelets: 186 10*3/uL (ref 145–400)
RDW: 14.3 % (ref 11.3–14.5)

## 2008-08-18 ENCOUNTER — Ambulatory Visit (HOSPITAL_COMMUNITY): Admission: RE | Admit: 2008-08-18 | Discharge: 2008-08-18 | Payer: Self-pay | Admitting: Emergency Medicine

## 2008-08-22 LAB — CBC WITH DIFFERENTIAL/PLATELET
Basophils Absolute: 0 10*3/uL (ref 0.0–0.1)
Eosinophils Absolute: 0 10*3/uL (ref 0.0–0.5)
HCT: 36.3 % (ref 34.8–46.6)
HGB: 12.4 g/dL (ref 11.6–15.9)
MCV: 93.8 fL (ref 79.5–101.0)
MONO%: 19.2 % — ABNORMAL HIGH (ref 0.0–14.0)
NEUT#: 2.3 10*3/uL (ref 1.5–6.5)
NEUT%: 52.3 % (ref 38.4–76.8)
Platelets: 168 10*3/uL (ref 145–400)
RDW: 13.5 % (ref 11.2–14.5)

## 2008-08-22 LAB — COMPREHENSIVE METABOLIC PANEL
Albumin: 4 g/dL (ref 3.5–5.2)
BUN: 14 mg/dL (ref 6–23)
CO2: 22 mEq/L (ref 19–32)
Glucose, Bld: 102 mg/dL — ABNORMAL HIGH (ref 70–99)
Sodium: 141 mEq/L (ref 135–145)
Total Bilirubin: 0.5 mg/dL (ref 0.3–1.2)
Total Protein: 6.3 g/dL (ref 6.0–8.3)

## 2008-08-24 ENCOUNTER — Encounter: Admission: RE | Admit: 2008-08-24 | Discharge: 2008-08-24 | Payer: Self-pay | Admitting: Emergency Medicine

## 2008-08-29 LAB — CBC WITH DIFFERENTIAL/PLATELET
Basophils Absolute: 0 10*3/uL (ref 0.0–0.1)
EOS%: 0.1 % (ref 0.0–7.0)
Eosinophils Absolute: 0 10*3/uL (ref 0.0–0.5)
HCT: 35.1 % (ref 34.8–46.6)
HGB: 12.3 g/dL (ref 11.6–15.9)
MCH: 33.8 pg (ref 25.1–34.0)
MCV: 96.5 fL (ref 79.5–101.0)
NEUT#: 3.6 10*3/uL (ref 1.5–6.5)
NEUT%: 66.8 % (ref 38.4–76.8)
lymph#: 1 10*3/uL (ref 0.9–3.3)

## 2008-08-30 LAB — COMPREHENSIVE METABOLIC PANEL
Albumin: 3.9 g/dL (ref 3.5–5.2)
CO2: 22 mEq/L (ref 19–32)
Calcium: 9.4 mg/dL (ref 8.4–10.5)
Chloride: 108 mEq/L (ref 96–112)
Glucose, Bld: 92 mg/dL (ref 70–99)
Sodium: 138 mEq/L (ref 135–145)
Total Bilirubin: 0.4 mg/dL (ref 0.3–1.2)
Total Protein: 6.4 g/dL (ref 6.0–8.3)

## 2008-08-30 LAB — IGG, IGA, IGM
IgA: 145 mg/dL (ref 68–378)
IgG (Immunoglobin G), Serum: 777 mg/dL (ref 694–1618)

## 2008-08-30 LAB — KAPPA/LAMBDA LIGHT CHAINS

## 2008-09-06 ENCOUNTER — Ambulatory Visit: Admission: RE | Admit: 2008-09-06 | Discharge: 2008-09-06 | Payer: Self-pay | Admitting: Internal Medicine

## 2008-09-06 ENCOUNTER — Ambulatory Visit: Payer: Self-pay | Admitting: Surgery

## 2008-09-06 ENCOUNTER — Encounter: Payer: Self-pay | Admitting: Internal Medicine

## 2008-09-12 LAB — COMPREHENSIVE METABOLIC PANEL
ALT: 12 U/L (ref 0–35)
CO2: 23 mEq/L (ref 19–32)
Calcium: 9.2 mg/dL (ref 8.4–10.5)
Chloride: 106 mEq/L (ref 96–112)
Creatinine, Ser: 0.71 mg/dL (ref 0.40–1.20)
Glucose, Bld: 103 mg/dL — ABNORMAL HIGH (ref 70–99)
Sodium: 140 mEq/L (ref 135–145)
Total Protein: 6.3 g/dL (ref 6.0–8.3)

## 2008-09-12 LAB — CBC WITH DIFFERENTIAL/PLATELET
BASO%: 0.3 % (ref 0.0–2.0)
MCHC: 34 g/dL (ref 31.5–36.0)
MONO#: 0.8 10*3/uL (ref 0.1–0.9)
NEUT#: 1.9 10*3/uL (ref 1.5–6.5)
RBC: 3.99 10*6/uL (ref 3.70–5.45)
RDW: 13.6 % (ref 11.2–14.5)
WBC: 3.6 10*3/uL — ABNORMAL LOW (ref 3.9–10.3)
lymph#: 0.9 10*3/uL (ref 0.9–3.3)
nRBC: 1 % — ABNORMAL HIGH (ref 0–0)

## 2008-09-19 LAB — COMPREHENSIVE METABOLIC PANEL
AST: 26 U/L (ref 0–37)
Albumin: 3.4 g/dL — ABNORMAL LOW (ref 3.5–5.2)
BUN: 10 mg/dL (ref 6–23)
Calcium: 9.2 mg/dL (ref 8.4–10.5)
Chloride: 109 mEq/L (ref 96–112)
Potassium: 3.6 mEq/L (ref 3.5–5.3)

## 2008-09-19 LAB — CBC WITH DIFFERENTIAL/PLATELET
Basophils Absolute: 0 10*3/uL (ref 0.0–0.1)
EOS%: 1 % (ref 0.0–7.0)
Eosinophils Absolute: 0 10*3/uL (ref 0.0–0.5)
HGB: 12.7 g/dL (ref 11.6–15.9)
MCH: 32.8 pg (ref 25.1–34.0)
MONO#: 0.4 10*3/uL (ref 0.1–0.9)
NEUT#: 2 10*3/uL (ref 1.5–6.5)
RDW: 14.3 % (ref 11.2–14.5)
WBC: 3.2 10*3/uL — ABNORMAL LOW (ref 3.9–10.3)
lymph#: 0.8 10*3/uL — ABNORMAL LOW (ref 0.9–3.3)

## 2008-09-22 ENCOUNTER — Ambulatory Visit: Payer: Self-pay | Admitting: Internal Medicine

## 2008-09-26 LAB — CBC WITH DIFFERENTIAL/PLATELET
Basophils Absolute: 0 10*3/uL (ref 0.0–0.1)
EOS%: 1.1 % (ref 0.0–7.0)
HGB: 12.2 g/dL (ref 11.6–15.9)
MCH: 31.9 pg (ref 25.1–34.0)
MCHC: 34.4 g/dL (ref 31.5–36.0)
MCV: 92.9 fL (ref 79.5–101.0)
MONO%: 13.7 % (ref 0.0–14.0)
RDW: 13.9 % (ref 11.2–14.5)

## 2008-09-26 LAB — COMPREHENSIVE METABOLIC PANEL
Alkaline Phosphatase: 76 U/L (ref 39–117)
BUN: 9 mg/dL (ref 6–23)
Glucose, Bld: 114 mg/dL — ABNORMAL HIGH (ref 70–99)
Sodium: 141 mEq/L (ref 135–145)
Total Bilirubin: 1 mg/dL (ref 0.3–1.2)

## 2008-10-03 LAB — CBC WITH DIFFERENTIAL/PLATELET
BASO%: 0.3 % (ref 0.0–2.0)
Basophils Absolute: 0 10*3/uL (ref 0.0–0.1)
EOS%: 1 % (ref 0.0–7.0)
HCT: 36.9 % (ref 34.8–46.6)
HGB: 12.6 g/dL (ref 11.6–15.9)
LYMPH%: 29.1 % (ref 14.0–49.7)
MCH: 31.4 pg (ref 25.1–34.0)
MCHC: 34.1 g/dL (ref 31.5–36.0)
MCV: 92 fL (ref 79.5–101.0)
MONO%: 17.7 % — ABNORMAL HIGH (ref 0.0–14.0)
NEUT%: 51.9 % (ref 38.4–76.8)
lymph#: 0.9 10*3/uL (ref 0.9–3.3)

## 2008-10-03 LAB — COMPREHENSIVE METABOLIC PANEL
ALT: 11 U/L (ref 0–35)
AST: 18 U/L (ref 0–37)
Alkaline Phosphatase: 66 U/L (ref 39–117)
BUN: 13 mg/dL (ref 6–23)
Calcium: 9.1 mg/dL (ref 8.4–10.5)
Creatinine, Ser: 0.73 mg/dL (ref 0.40–1.20)
Total Bilirubin: 0.6 mg/dL (ref 0.3–1.2)

## 2008-10-10 LAB — CBC WITH DIFFERENTIAL/PLATELET
Basophils Absolute: 0 10*3/uL (ref 0.0–0.1)
EOS%: 0 % (ref 0.0–7.0)
HCT: 35.9 % (ref 34.8–46.6)
HGB: 12.5 g/dL (ref 11.6–15.9)
MCH: 32.1 pg (ref 25.1–34.0)
MCV: 92.3 fL (ref 79.5–101.0)
MONO%: 15.5 % — ABNORMAL HIGH (ref 0.0–14.0)
NEUT%: 62.7 % (ref 38.4–76.8)
RDW: 14.2 % (ref 11.2–14.5)

## 2008-10-10 LAB — COMPREHENSIVE METABOLIC PANEL
Alkaline Phosphatase: 63 U/L (ref 39–117)
BUN: 9 mg/dL (ref 6–23)
Glucose, Bld: 88 mg/dL (ref 70–99)
Total Bilirubin: 0.7 mg/dL (ref 0.3–1.2)

## 2008-10-16 ENCOUNTER — Emergency Department (HOSPITAL_COMMUNITY): Admission: EM | Admit: 2008-10-16 | Discharge: 2008-10-16 | Payer: Self-pay | Admitting: *Deleted

## 2008-10-17 LAB — COMPREHENSIVE METABOLIC PANEL
CO2: 22 mEq/L (ref 19–32)
Calcium: 9.4 mg/dL (ref 8.4–10.5)
Chloride: 108 mEq/L (ref 96–112)
Glucose, Bld: 119 mg/dL — ABNORMAL HIGH (ref 70–99)
Sodium: 140 mEq/L (ref 135–145)
Total Bilirubin: 1 mg/dL (ref 0.3–1.2)
Total Protein: 6.2 g/dL (ref 6.0–8.3)

## 2008-10-17 LAB — CBC WITH DIFFERENTIAL/PLATELET
Basophils Absolute: 0 10*3/uL (ref 0.0–0.1)
Eosinophils Absolute: 0 10*3/uL (ref 0.0–0.5)
HGB: 11.7 g/dL (ref 11.6–15.9)
LYMPH%: 15.2 % (ref 14.0–49.7)
MCV: 92.6 fL (ref 79.5–101.0)
MONO%: 14.2 % — ABNORMAL HIGH (ref 0.0–14.0)
NEUT#: 3.3 10*3/uL (ref 1.5–6.5)
Platelets: 144 10*3/uL — ABNORMAL LOW (ref 145–400)
RDW: 14.1 % (ref 11.2–14.5)

## 2008-10-24 LAB — COMPREHENSIVE METABOLIC PANEL
AST: 17 U/L (ref 0–37)
Alkaline Phosphatase: 64 U/L (ref 39–117)
BUN: 10 mg/dL (ref 6–23)
Calcium: 8.9 mg/dL (ref 8.4–10.5)
Creatinine, Ser: 0.72 mg/dL (ref 0.40–1.20)

## 2008-10-24 LAB — CBC WITH DIFFERENTIAL/PLATELET
BASO%: 0.3 % (ref 0.0–2.0)
Eosinophils Absolute: 0 10*3/uL (ref 0.0–0.5)
LYMPH%: 24 % (ref 14.0–49.7)
MONO#: 0.7 10*3/uL (ref 0.1–0.9)
NEUT#: 1.8 10*3/uL (ref 1.5–6.5)
Platelets: 108 10*3/uL — ABNORMAL LOW (ref 145–400)
RBC: 3.66 10*6/uL — ABNORMAL LOW (ref 3.70–5.45)
RDW: 14.7 % — ABNORMAL HIGH (ref 11.2–14.5)
WBC: 3.3 10*3/uL — ABNORMAL LOW (ref 3.9–10.3)
lymph#: 0.8 10*3/uL — ABNORMAL LOW (ref 0.9–3.3)

## 2008-11-03 ENCOUNTER — Ambulatory Visit: Payer: Self-pay | Admitting: Internal Medicine

## 2008-11-07 LAB — CBC WITH DIFFERENTIAL/PLATELET
BASO%: 0.3 % (ref 0.0–2.0)
EOS%: 0.5 % (ref 0.0–7.0)
HCT: 31.1 % — ABNORMAL LOW (ref 34.8–46.6)
LYMPH%: 16.4 % (ref 14.0–49.7)
MCH: 32.8 pg (ref 25.1–34.0)
MCHC: 34.7 g/dL (ref 31.5–36.0)
MCV: 94.5 fL (ref 79.5–101.0)
MONO%: 19.9 % — ABNORMAL HIGH (ref 0.0–14.0)
NEUT%: 62.9 % (ref 38.4–76.8)
lymph#: 0.7 10*3/uL — ABNORMAL LOW (ref 0.9–3.3)

## 2008-11-08 LAB — COMPREHENSIVE METABOLIC PANEL
ALT: 12 U/L (ref 0–35)
AST: 16 U/L (ref 0–37)
Alkaline Phosphatase: 67 U/L (ref 39–117)
Chloride: 110 mEq/L (ref 96–112)
Creatinine, Ser: 0.57 mg/dL (ref 0.40–1.20)
Total Bilirubin: 0.8 mg/dL (ref 0.3–1.2)

## 2008-11-08 LAB — IGG, IGA, IGM
IgA: 151 mg/dL (ref 68–378)
IgG (Immunoglobin G), Serum: 577 mg/dL — ABNORMAL LOW (ref 694–1618)
IgM, Serum: 15 mg/dL — ABNORMAL LOW (ref 60–263)

## 2008-11-08 LAB — KAPPA/LAMBDA LIGHT CHAINS

## 2008-11-08 LAB — BETA 2 MICROGLOBULIN, SERUM: Beta-2 Microglobulin: 1.35 mg/L (ref 1.01–1.73)

## 2008-11-14 LAB — CBC WITH DIFFERENTIAL/PLATELET
BASO%: 0 % (ref 0.0–2.0)
EOS%: 0 % (ref 0.0–7.0)
LYMPH%: 20.4 % (ref 14.0–49.7)
MCH: 32.5 pg (ref 25.1–34.0)
MCHC: 34.6 g/dL (ref 31.5–36.0)
MONO#: 0.7 10*3/uL (ref 0.1–0.9)
RBC: 3.81 10*6/uL (ref 3.70–5.45)
WBC: 2.8 10*3/uL — ABNORMAL LOW (ref 3.9–10.3)
lymph#: 0.6 10*3/uL — ABNORMAL LOW (ref 0.9–3.3)

## 2008-11-14 LAB — COMPREHENSIVE METABOLIC PANEL
ALT: 16 U/L (ref 0–35)
AST: 28 U/L (ref 0–37)
CO2: 22 mEq/L (ref 19–32)
Chloride: 106 mEq/L (ref 96–112)
Creatinine, Ser: 0.81 mg/dL (ref 0.40–1.20)
Sodium: 139 mEq/L (ref 135–145)
Total Bilirubin: 0.9 mg/dL (ref 0.3–1.2)
Total Protein: 6.5 g/dL (ref 6.0–8.3)

## 2008-11-14 LAB — LACTATE DEHYDROGENASE: LDH: 469 U/L — ABNORMAL HIGH (ref 94–250)

## 2008-11-23 ENCOUNTER — Emergency Department (HOSPITAL_COMMUNITY): Admission: EM | Admit: 2008-11-23 | Discharge: 2008-11-23 | Payer: Self-pay | Admitting: Emergency Medicine

## 2009-01-06 ENCOUNTER — Ambulatory Visit: Payer: Self-pay | Admitting: Internal Medicine

## 2009-01-10 LAB — CBC WITH DIFFERENTIAL/PLATELET
BASO%: 0.4 % (ref 0.0–2.0)
Basophils Absolute: 0 10*3/uL (ref 0.0–0.1)
EOS%: 2.4 % (ref 0.0–7.0)
Eosinophils Absolute: 0.1 10*3/uL (ref 0.0–0.5)
HCT: 33.2 % — ABNORMAL LOW (ref 34.8–46.6)
HGB: 11.4 g/dL — ABNORMAL LOW (ref 11.6–15.9)
LYMPH%: 26.5 % (ref 14.0–49.7)
MCH: 34.6 pg — ABNORMAL HIGH (ref 25.1–34.0)
MCHC: 34.5 g/dL (ref 31.5–36.0)
MCV: 100.3 fL (ref 79.5–101.0)
MONO#: 0.4 10*3/uL (ref 0.1–0.9)
MONO%: 13.1 % (ref 0.0–14.0)
NEUT#: 1.8 10*3/uL (ref 1.5–6.5)
NEUT%: 57.6 % (ref 38.4–76.8)
Platelets: 190 10*3/uL (ref 145–400)
RBC: 3.31 10*6/uL — ABNORMAL LOW (ref 3.70–5.45)
RDW: 14.2 % (ref 11.2–14.5)
WBC: 3.2 10*3/uL — ABNORMAL LOW (ref 3.9–10.3)
lymph#: 0.8 10*3/uL — ABNORMAL LOW (ref 0.9–3.3)

## 2009-01-11 LAB — COMPREHENSIVE METABOLIC PANEL
AST: 17 U/L (ref 0–37)
Alkaline Phosphatase: 64 U/L (ref 39–117)
BUN: 7 mg/dL (ref 6–23)
Calcium: 9.8 mg/dL (ref 8.4–10.5)
Chloride: 108 mEq/L (ref 96–112)
Creatinine, Ser: 0.7 mg/dL (ref 0.40–1.20)

## 2009-01-11 LAB — KAPPA/LAMBDA LIGHT CHAINS: Kappa:Lambda Ratio: 0.57 (ref 0.26–1.65)

## 2009-01-11 LAB — IGG, IGA, IGM: IgM, Serum: 46 mg/dL — ABNORMAL LOW (ref 60–263)

## 2009-01-11 LAB — BETA 2 MICROGLOBULIN, SERUM: Beta-2 Microglobulin: 1.55 mg/L (ref 1.01–1.73)

## 2009-02-23 ENCOUNTER — Ambulatory Visit: Payer: Self-pay | Admitting: Internal Medicine

## 2009-04-06 ENCOUNTER — Ambulatory Visit: Payer: Self-pay | Admitting: Internal Medicine

## 2009-04-10 LAB — CBC WITH DIFFERENTIAL/PLATELET
Basophils Absolute: 0 10*3/uL (ref 0.0–0.1)
EOS%: 1.2 % (ref 0.0–7.0)
HCT: 34 % — ABNORMAL LOW (ref 34.8–46.6)
HGB: 11.2 g/dL — ABNORMAL LOW (ref 11.6–15.9)
LYMPH%: 39.5 % (ref 14.0–49.7)
MCH: 32.6 pg (ref 25.1–34.0)
NEUT%: 50 % (ref 38.4–76.8)
Platelets: 196 10*3/uL (ref 145–400)
lymph#: 1.3 10*3/uL (ref 0.9–3.3)

## 2009-04-11 LAB — IGG, IGA, IGM
IgA: 238 mg/dL (ref 68–378)
IgM, Serum: 40 mg/dL — ABNORMAL LOW (ref 60–263)

## 2009-04-11 LAB — COMPREHENSIVE METABOLIC PANEL
AST: 18 U/L (ref 0–37)
BUN: 14 mg/dL (ref 6–23)
CO2: 23 mEq/L (ref 19–32)
Calcium: 9.2 mg/dL (ref 8.4–10.5)
Chloride: 108 mEq/L (ref 96–112)
Creatinine, Ser: 0.68 mg/dL (ref 0.40–1.20)

## 2009-04-11 LAB — LACTATE DEHYDROGENASE: LDH: 139 U/L (ref 94–250)

## 2009-04-12 ENCOUNTER — Inpatient Hospital Stay (HOSPITAL_COMMUNITY): Admission: EM | Admit: 2009-04-12 | Discharge: 2009-04-13 | Payer: Self-pay | Admitting: Emergency Medicine

## 2009-04-12 ENCOUNTER — Ambulatory Visit: Payer: Self-pay | Admitting: Cardiology

## 2009-04-12 ENCOUNTER — Ambulatory Visit: Payer: Self-pay | Admitting: Vascular Surgery

## 2009-04-12 ENCOUNTER — Encounter (INDEPENDENT_AMBULATORY_CARE_PROVIDER_SITE_OTHER): Payer: Self-pay | Admitting: Internal Medicine

## 2009-04-25 ENCOUNTER — Ambulatory Visit (HOSPITAL_COMMUNITY): Admission: RE | Admit: 2009-04-25 | Discharge: 2009-04-25 | Payer: Self-pay | Admitting: Internal Medicine

## 2009-05-16 ENCOUNTER — Ambulatory Visit: Payer: Self-pay | Admitting: Gastroenterology

## 2009-05-17 ENCOUNTER — Ambulatory Visit: Payer: Self-pay | Admitting: Gastroenterology

## 2009-05-17 LAB — CONVERTED CEMR LAB
BUN: 12 mg/dL (ref 6–23)
Basophils Relative: 4.3 % — ABNORMAL HIGH (ref 0.0–3.0)
CO2: 26 meq/L (ref 19–32)
Chloride: 105 meq/L (ref 96–112)
Creatinine, Ser: 0.7 mg/dL (ref 0.4–1.2)
Eosinophils Absolute: 0.1 10*3/uL (ref 0.0–0.7)
Eosinophils Relative: 1.5 % (ref 0.0–5.0)
Hemoglobin: 12.3 g/dL (ref 12.0–15.0)
Lymphocytes Relative: 32.3 % (ref 12.0–46.0)
MCHC: 34.8 g/dL (ref 30.0–36.0)
Monocytes Relative: 5 % (ref 3.0–12.0)
Neutro Abs: 2.9 10*3/uL (ref 1.4–7.7)
Neutrophils Relative %: 56.9 % (ref 43.0–77.0)
Potassium: 4 meq/L (ref 3.5–5.1)
RBC: 3.55 M/uL — ABNORMAL LOW (ref 3.87–5.11)
WBC: 5.2 10*3/uL (ref 4.5–10.5)

## 2009-05-18 ENCOUNTER — Ambulatory Visit: Payer: Self-pay | Admitting: Internal Medicine

## 2009-05-22 ENCOUNTER — Encounter: Payer: Self-pay | Admitting: Gastroenterology

## 2009-06-28 ENCOUNTER — Ambulatory Visit: Payer: Self-pay | Admitting: Internal Medicine

## 2009-07-13 LAB — CBC WITH DIFFERENTIAL/PLATELET
Eosinophils Absolute: 0.1 10*3/uL (ref 0.0–0.5)
HCT: 37 % (ref 34.8–46.6)
LYMPH%: 27.7 % (ref 14.0–49.7)
MCHC: 33.8 g/dL (ref 31.5–36.0)
MCV: 98.5 fL (ref 79.5–101.0)
MONO#: 0.3 10*3/uL (ref 0.1–0.9)
MONO%: 7.4 % (ref 0.0–14.0)
NEUT#: 2.8 10*3/uL (ref 1.5–6.5)
NEUT%: 62.4 % (ref 38.4–76.8)
Platelets: 203 10*3/uL (ref 145–400)
WBC: 4.4 10*3/uL (ref 3.9–10.3)

## 2009-07-14 LAB — IGG, IGA, IGM
IgA: 295 mg/dL (ref 68–378)
IgG (Immunoglobin G), Serum: 1100 mg/dL (ref 694–1618)
IgM, Serum: 49 mg/dL — ABNORMAL LOW (ref 60–263)

## 2009-07-14 LAB — LACTATE DEHYDROGENASE: LDH: 134 U/L (ref 94–250)

## 2009-07-14 LAB — COMPREHENSIVE METABOLIC PANEL
Alkaline Phosphatase: 104 U/L (ref 39–117)
CO2: 22 mEq/L (ref 19–32)
Creatinine, Ser: 0.72 mg/dL (ref 0.40–1.20)
Glucose, Bld: 137 mg/dL — ABNORMAL HIGH (ref 70–99)
Total Bilirubin: 0.5 mg/dL (ref 0.3–1.2)

## 2009-07-14 LAB — BETA 2 MICROGLOBULIN, SERUM: Beta-2 Microglobulin: 1.3 mg/L (ref 1.01–1.73)

## 2009-09-06 ENCOUNTER — Ambulatory Visit (HOSPITAL_COMMUNITY): Admission: RE | Admit: 2009-09-06 | Discharge: 2009-09-06 | Payer: Self-pay | Admitting: Emergency Medicine

## 2009-10-06 ENCOUNTER — Ambulatory Visit: Payer: Self-pay | Admitting: Internal Medicine

## 2009-10-11 LAB — CBC WITH DIFFERENTIAL/PLATELET
Basophils Absolute: 0 10*3/uL (ref 0.0–0.1)
EOS%: 1.3 % (ref 0.0–7.0)
HGB: 11.8 g/dL (ref 11.6–15.9)
LYMPH%: 40 % (ref 14.0–49.7)
MCH: 33.3 pg (ref 25.1–34.0)
MCV: 96.2 fL (ref 79.5–101.0)
MONO%: 8.1 % (ref 0.0–14.0)
RBC: 3.54 10*6/uL — ABNORMAL LOW (ref 3.70–5.45)
RDW: 13.6 % (ref 11.2–14.5)

## 2009-10-12 LAB — KAPPA/LAMBDA LIGHT CHAINS
Kappa free light chain: 0.7 mg/dL (ref 0.33–1.94)
Lambda Free Lght Chn: 3.12 mg/dL — ABNORMAL HIGH (ref 0.57–2.63)

## 2009-10-12 LAB — COMPREHENSIVE METABOLIC PANEL
AST: 15 U/L (ref 0–37)
Albumin: 4.1 g/dL (ref 3.5–5.2)
Alkaline Phosphatase: 75 U/L (ref 39–117)
BUN: 11 mg/dL (ref 6–23)
Potassium: 3.9 mEq/L (ref 3.5–5.3)
Total Bilirubin: 0.8 mg/dL (ref 0.3–1.2)

## 2009-10-12 LAB — IGG, IGA, IGM
IgA: 268 mg/dL (ref 68–378)
IgM, Serum: 41 mg/dL — ABNORMAL LOW (ref 60–263)

## 2009-10-12 LAB — BETA 2 MICROGLOBULIN, SERUM: Beta-2 Microglobulin: 1.54 mg/L (ref 1.01–1.73)

## 2009-11-21 ENCOUNTER — Ambulatory Visit: Payer: Self-pay | Admitting: Internal Medicine

## 2009-12-22 ENCOUNTER — Emergency Department (HOSPITAL_COMMUNITY): Admission: EM | Admit: 2009-12-22 | Discharge: 2009-12-22 | Payer: Self-pay | Admitting: Emergency Medicine

## 2009-12-22 ENCOUNTER — Encounter: Payer: Self-pay | Admitting: Cardiology

## 2010-01-08 ENCOUNTER — Ambulatory Visit: Payer: Self-pay | Admitting: Internal Medicine

## 2010-01-10 LAB — CBC WITH DIFFERENTIAL/PLATELET
BASO%: 0.8 % (ref 0.0–2.0)
EOS%: 2 % (ref 0.0–7.0)
HCT: 33.4 % — ABNORMAL LOW (ref 34.8–46.6)
LYMPH%: 41 % (ref 14.0–49.7)
MCH: 31.2 pg (ref 25.1–34.0)
MCHC: 33.2 g/dL (ref 31.5–36.0)
NEUT%: 47.2 % (ref 38.4–76.8)
Platelets: 233 10*3/uL (ref 145–400)

## 2010-01-11 LAB — COMPREHENSIVE METABOLIC PANEL
AST: 16 U/L (ref 0–37)
Alkaline Phosphatase: 97 U/L (ref 39–117)
BUN: 17 mg/dL (ref 6–23)
Creatinine, Ser: 0.85 mg/dL (ref 0.40–1.20)

## 2010-01-11 LAB — IGG, IGA, IGM
IgA: 291 mg/dL (ref 68–378)
IgM, Serum: 56 mg/dL — ABNORMAL LOW (ref 60–263)

## 2010-01-11 LAB — KAPPA/LAMBDA LIGHT CHAINS
Kappa free light chain: 0.88 mg/dL (ref 0.33–1.94)
Lambda Free Lght Chn: 2.27 mg/dL (ref 0.57–2.63)

## 2010-01-12 DIAGNOSIS — M5430 Sciatica, unspecified side: Secondary | ICD-10-CM

## 2010-01-12 DIAGNOSIS — C9002 Multiple myeloma in relapse: Secondary | ICD-10-CM | POA: Insufficient documentation

## 2010-01-12 DIAGNOSIS — I10 Essential (primary) hypertension: Secondary | ICD-10-CM | POA: Insufficient documentation

## 2010-01-12 DIAGNOSIS — K219 Gastro-esophageal reflux disease without esophagitis: Secondary | ICD-10-CM

## 2010-01-12 DIAGNOSIS — E039 Hypothyroidism, unspecified: Secondary | ICD-10-CM

## 2010-01-17 ENCOUNTER — Ambulatory Visit: Payer: Self-pay | Admitting: Cardiology

## 2010-02-06 ENCOUNTER — Ambulatory Visit: Payer: Self-pay

## 2010-02-06 ENCOUNTER — Ambulatory Visit: Payer: Self-pay | Admitting: Cardiology

## 2010-02-06 ENCOUNTER — Encounter: Payer: Self-pay | Admitting: Cardiology

## 2010-02-06 ENCOUNTER — Ambulatory Visit (HOSPITAL_COMMUNITY): Admission: RE | Admit: 2010-02-06 | Discharge: 2010-02-06 | Payer: Self-pay | Admitting: Cardiology

## 2010-02-07 ENCOUNTER — Telehealth: Payer: Self-pay | Admitting: Cardiology

## 2010-04-09 ENCOUNTER — Ambulatory Visit: Payer: Self-pay | Admitting: Internal Medicine

## 2010-04-11 LAB — CBC WITH DIFFERENTIAL/PLATELET
BASO%: 0.2 % (ref 0.0–2.0)
EOS%: 1.5 % (ref 0.0–7.0)
HCT: 35.5 % (ref 34.8–46.6)
LYMPH%: 40.2 % (ref 14.0–49.7)
MCH: 32 pg (ref 25.1–34.0)
MCHC: 34.1 g/dL (ref 31.5–36.0)
MCV: 93.9 fL (ref 79.5–101.0)
MONO#: 0.4 10*3/uL (ref 0.1–0.9)
MONO%: 7.7 % (ref 0.0–14.0)
NEUT%: 50.4 % (ref 38.4–76.8)
Platelets: 220 10*3/uL (ref 145–400)

## 2010-04-12 LAB — LACTATE DEHYDROGENASE: LDH: 123 U/L (ref 94–250)

## 2010-04-12 LAB — COMPREHENSIVE METABOLIC PANEL
ALT: 15 U/L (ref 0–35)
Alkaline Phosphatase: 88 U/L (ref 39–117)
CO2: 25 mEq/L (ref 19–32)
Creatinine, Ser: 0.81 mg/dL (ref 0.40–1.20)
Total Bilirubin: 0.6 mg/dL (ref 0.3–1.2)

## 2010-04-12 LAB — BETA 2 MICROGLOBULIN, SERUM: Beta-2 Microglobulin: 1.69 mg/L (ref 1.01–1.73)

## 2010-04-12 LAB — IGG, IGA, IGM
IgG (Immunoglobin G), Serum: 1350 mg/dL (ref 694–1618)
IgM, Serum: 49 mg/dL — ABNORMAL LOW (ref 60–263)

## 2010-04-12 LAB — KAPPA/LAMBDA LIGHT CHAINS: Kappa:Lambda Ratio: 0.25 — ABNORMAL LOW (ref 0.26–1.65)

## 2010-05-05 ENCOUNTER — Encounter: Admission: RE | Admit: 2010-05-05 | Discharge: 2010-05-05 | Payer: Self-pay | Admitting: Internal Medicine

## 2010-05-28 ENCOUNTER — Ambulatory Visit: Payer: Self-pay | Admitting: Internal Medicine

## 2010-07-09 ENCOUNTER — Ambulatory Visit: Payer: Self-pay | Admitting: Internal Medicine

## 2010-07-22 ENCOUNTER — Encounter: Payer: Self-pay | Admitting: Emergency Medicine

## 2010-07-22 ENCOUNTER — Encounter: Payer: Self-pay | Admitting: Internal Medicine

## 2010-07-23 ENCOUNTER — Ambulatory Visit (HOSPITAL_COMMUNITY)
Admission: RE | Admit: 2010-07-23 | Discharge: 2010-07-23 | Payer: Self-pay | Source: Home / Self Care | Attending: Internal Medicine | Admitting: Internal Medicine

## 2010-08-02 NOTE — Letter (Signed)
Summary: [facility]  [facility]   Imported By: Marylou Mccoy 01/30/2010 10:56:03  _____________________________________________________________________  External Attachment:    Type:   Image     Comment:   External Document

## 2010-08-02 NOTE — Assessment & Plan Note (Signed)
Summary: np6/ekg changes/palps   Referring Provider:  Lucky Cowboy, MD Primary Provider:  Lucky Cowboy, MD  CC:  follow up hospital.  History of Present Illness: 60 year old female for evaluation of chest pain and dyspnea. Echocardiogram in October 2010 after a syncopal episode showed normal LV function and mild mitral regurgitation. The syncope was felt to have been a vagal episode. Seen in the emergency room in June of 2011 with chest pain. Cardiac enzymes negative. D-dimer mildly elevated but chest CT showed no pulmonary embolus. There was some reflux of contrast into the hepatic veins may indicate a degree of right heart dysfunction. Patient states at that time she felt nausea and fatigue. She then had a chest heaviness for approximately 20 minutes. It did not radiate. She also had diaphoresis and shortness of breath. Pain was not pleuritic or positional nor was it related to food. She has continued to have some dyspnea on exertion but no orthopnea or PND. She also has pedal edema. She feels a "pinch" in her chest when she exerts herself at  times. It is in the left chest area.  Current Medications (verified): 1)  Synthroid 100 Mcg Tabs (Levothyroxine Sodium) .Marland Kitchen.. 1 Tablet By Mouth Once Daily 2)  Pantoprazole Sodium 40 Mg Tbec (Pantoprazole Sodium) .Marland Kitchen.. 1 Tablet By Mouth Once Daily 3)  Tums 500 Mg Chew (Calcium Carbonate Antacid) .... As Needed 4)  Tylenol Arthritis Pain 650 Mg Cr-Tabs (Acetaminophen) .... As Needed 5)  Gabapentin 300 Mg Caps (Gabapentin) .Marland Kitchen.. 1 Capsule By Mouth 4 Times By Mouth Once Daily \\par  6)  Hydrocodone-Acetaminophen 5-500 Mg Tabs (Hydrocodone-Acetaminophen) .... Take By Mouth Every 4-6 Hours As Needed Pain 7)  Magnesium Oxide 400 Mg Tabs (Magnesium Oxide) .... Take 1 By Mouth Once Daily 8)  Vitamnin D 4000 .Marland Kitchen.. 1 Tablet By Mouth Once Daily  Allergies: No Known Drug Allergies  Past History:  Past Medical History: GERD MULTIPLE   MYELOMA HYPOTHYROIDISM SCIATICA  Syncopal episode, questionable etiology, likely vasovagal  Past Surgical History: Achilles heel/tendon surgery  Left knee surgery Multiple myeloma status post bone marrow transplant and chemotherapy completed in July 2010.  Cholecystectomy Hysterectomy  Family History: Reviewed history from 05/16/2009 and no changes required. uncle has colon cancer Father with CVA No premature CAD  Social History: Reviewed history from 05/16/2009 and no changes required. she is married, she has 3 children, she does not work, she quit smoking, she does not drink alcohol.  Review of Systems       Problems with feet and hand tingling that she attributes to her previous chemotherapy but no fevers or chills, productive cough, hemoptysis, dysphasia, odynophagia, melena, hematochezia, dysuria, hematuria, rash, seizure activity, orthopnea, PND,  claudication. Remaining systems are negative.   Vital Signs:  Patient profile:   60 year old female Height:      66 inches Weight:      196 pounds BMI:     31.75 Pulse rate:   85 / minute Resp:     14 per minute BP sitting:   112 / 76  (left arm)  Vitals Entered By: Kem Parkinson (January 17, 2010 3:31 PM)  Physical Exam  General:  Well developed/well nourished in NAD Skin warm/dry Patient not depressed No peripheral clubbing Back-normal HEENT-normal/normal eyelids Neck supple/normal carotid upstroke bilaterally; no bruits; no JVD; no thyromegaly chest - CTA/ normal expansion CV - RRR/normal S1 and S2; no murmurs, rubs or gallops; no rub; PMI nondisplaced Abdomen -NT/ND, no HSM, no mass, + bowel sounds, no  bruit 2+ femoral pulses, no bruits Ext-trace edema, chords, 2+ DP Neuro-grossly nonfocal     EKG  Procedure date:  01/17/2010  Findings:      sinus rhythm at a rate of 70. Axis normal. No ST changes.  Impression & Recommendations:  Problem # 1:  CHEST PAIN (ICD-786.50) Symptoms atypical. Schedule  stress echocardiogram. The following medications were removed from the medication list:    Aspirin 81 Mg Tbec (Aspirin) .Marland Kitchen... 1 tablet by mouth once daily  Orders: Echocardiogram (Echo) Stress Echo (Stress Echo)  Problem # 2:  SHORTNESS OF BREATH (ICD-786.05) Schedule echocardiogram to quantify LV function and to exclude valvular disease and diastolic dysfunction. The following medications were removed from the medication list:    Aspirin 81 Mg Tbec (Aspirin) .Marland Kitchen... 1 tablet by mouth once daily  Orders: Echocardiogram (Echo) Stress Echo (Stress Echo)  Problem # 3:  MULTIPLE  MYELOMA (ICD-203.00) Management per hematology oncology.  Problem # 4:  HYPOTHYROIDISM (ICD-244.9)  Her updated medication list for this problem includes:    Synthroid 100 Mcg Tabs (Levothyroxine sodium) .Marland Kitchen... 1 tablet by mouth once daily  Patient Instructions: 1)  Your physician recommends that you schedule a follow-up appointment in: as needed with Dr. Jens Som 2)  Your physician recommends that you continue on your current medications as directed. Please refer to the Current Medication list given to you today. 3)  Your physician has requested that you have a stress echocardiogram. For further information please visit [email or web address]  Please follow instruction sheet as given. 4)  Your physician has requested that you have an echocardiogram.  Echocardiography is a painless test that uses sound waves to create images of your heart. It provides your doctor with information about the size and shape of your heart and how well your heart's Hefty and valves are working.  This procedure takes approximately one hour. There are no restrictions for this procedure.

## 2010-08-02 NOTE — Progress Notes (Signed)
Summary: chestpain  Phone Note Call from Patient Call back at Home Phone [phone]   Caller: Patient Reason for Call: Talk to Nurse Details for Reason: c/o chestpain hurt in the middle .  Initial call taken by: Lorne Skeens,  February 07, 2010 12:39 PM  Follow-up for Phone Call        PT CONT TO C/O CHEST PAIN INFORMED PT STRESS /ECHO WAS NORMAL TO F/U WITH PMD.VERBALIZED UNDERSTANDING. Follow-up by: Scherrie Bateman, LPN,  February 07, 2010 12:43 PM

## 2010-08-22 ENCOUNTER — Other Ambulatory Visit: Payer: Self-pay | Admitting: Internal Medicine

## 2010-08-22 ENCOUNTER — Encounter (HOSPITAL_BASED_OUTPATIENT_CLINIC_OR_DEPARTMENT_OTHER): Payer: Medicare HMO | Admitting: Internal Medicine

## 2010-08-22 DIAGNOSIS — C9000 Multiple myeloma not having achieved remission: Secondary | ICD-10-CM

## 2010-08-22 DIAGNOSIS — Z452 Encounter for adjustment and management of vascular access device: Secondary | ICD-10-CM

## 2010-08-22 LAB — CBC WITH DIFFERENTIAL/PLATELET
EOS%: 1.4 % (ref 0.0–7.0)
LYMPH%: 37.2 % (ref 14.0–49.7)
MCH: 31.9 pg (ref 25.1–34.0)
MCHC: 34.1 g/dL (ref 31.5–36.0)
MCV: 93.4 fL (ref 79.5–101.0)
MONO%: 8.2 % (ref 0.0–14.0)
Platelets: 240 10*3/uL (ref 145–400)
RBC: 3.43 10*6/uL — ABNORMAL LOW (ref 3.70–5.45)
RDW: 13.7 % (ref 11.2–14.5)

## 2010-08-23 LAB — COMPREHENSIVE METABOLIC PANEL
Albumin: 4 g/dL (ref 3.5–5.2)
Alkaline Phosphatase: 65 U/L (ref 39–117)
BUN: 10 mg/dL (ref 6–23)
CO2: 19 mEq/L (ref 19–32)
Calcium: 9.4 mg/dL (ref 8.4–10.5)
Chloride: 106 mEq/L (ref 96–112)
Glucose, Bld: 85 mg/dL (ref 70–99)
Potassium: 3.7 mEq/L (ref 3.5–5.3)

## 2010-08-23 LAB — IGG, IGA, IGM
IgA: 254 mg/dL (ref 68–378)
IgG (Immunoglobin G), Serum: 1230 mg/dL (ref 694–1618)

## 2010-08-23 LAB — LACTATE DEHYDROGENASE: LDH: 122 U/L (ref 94–250)

## 2010-08-29 ENCOUNTER — Encounter (HOSPITAL_BASED_OUTPATIENT_CLINIC_OR_DEPARTMENT_OTHER): Payer: Medicare HMO | Admitting: Internal Medicine

## 2010-08-29 DIAGNOSIS — C9000 Multiple myeloma not having achieved remission: Secondary | ICD-10-CM

## 2010-09-16 LAB — URINALYSIS, ROUTINE W REFLEX MICROSCOPIC
Bilirubin Urine: NEGATIVE
Ketones, ur: NEGATIVE mg/dL
Nitrite: NEGATIVE
Specific Gravity, Urine: 1.02 (ref 1.005–1.030)
Urobilinogen, UA: 0.2 mg/dL (ref 0.0–1.0)

## 2010-09-16 LAB — COMPREHENSIVE METABOLIC PANEL
BUN: 9 mg/dL (ref 6–23)
CO2: 26 mEq/L (ref 19–32)
Chloride: 109 mEq/L (ref 96–112)
Creatinine, Ser: 0.64 mg/dL (ref 0.4–1.2)
GFR calc non Af Amer: >60 mL/min (ref >60)
Total Bilirubin: 0.7 mg/dL (ref 0.3–1.2)

## 2010-09-16 LAB — D-DIMER, QUANTITATIVE: D-Dimer, Quant: 0.61 ug/mL-FEU — ABNORMAL HIGH (ref 0.00–0.48)

## 2010-09-16 LAB — CBC
MCH: 32.3 pg (ref 26.0–34.0)
MCHC: 33.9 g/dL (ref 30.0–36.0)
MCV: 95.1 fL (ref 78.0–100.0)
Platelets: 218 10*3/uL (ref 150–400)
RDW: 14.1 % (ref 11.5–15.5)

## 2010-09-16 LAB — DIFFERENTIAL
Basophils Absolute: 0 10*3/uL (ref 0.0–0.1)
Basophils Relative: 0 % (ref 0–1)
Eosinophils Relative: 2 % (ref 0–5)
Lymphocytes Relative: 37 % (ref 12–46)
Neutro Abs: 2.9 10*3/uL (ref 1.7–7.7)

## 2010-10-04 ENCOUNTER — Encounter (HOSPITAL_BASED_OUTPATIENT_CLINIC_OR_DEPARTMENT_OTHER): Payer: Medicare HMO | Admitting: Internal Medicine

## 2010-10-04 DIAGNOSIS — C9000 Multiple myeloma not having achieved remission: Secondary | ICD-10-CM

## 2010-10-04 DIAGNOSIS — Z452 Encounter for adjustment and management of vascular access device: Secondary | ICD-10-CM

## 2010-10-04 LAB — BASIC METABOLIC PANEL
BUN: 8 mg/dL (ref 6–23)
CO2: 26 mEq/L (ref 19–32)
Calcium: 9.2 mg/dL (ref 8.4–10.5)
GFR calc non Af Amer: >60 mL/min (ref >60)
Glucose, Bld: 101 mg/dL — ABNORMAL HIGH (ref 70–99)
Potassium: 3.9 mEq/L (ref 3.5–5.1)
Sodium: 142 mEq/L (ref 135–145)

## 2010-10-04 LAB — COMPREHENSIVE METABOLIC PANEL
ALT: 21 U/L (ref 0–35)
AST: 19 U/L (ref 0–37)
Albumin: 3.6 g/dL (ref 3.5–5.2)
CO2: 23 mEq/L (ref 19–32)
Calcium: 9.2 mg/dL (ref 8.4–10.5)
GFR calc Af Amer: >60 mL/min (ref >60)
GFR calc non Af Amer: >60 mL/min (ref >60)
Sodium: 138 mEq/L (ref 135–145)

## 2010-10-04 LAB — CARDIAC PANEL(CRET KIN+CKTOT+MB+TROPI)
CK, MB: 0.7 ng/mL (ref 0.3–4.0)
CK, MB: 0.7 ng/mL (ref 0.3–4.0)
CK, MB: 0.7 ng/mL (ref 0.3–4.0)
Relative Index: 0.6 (ref 0.0–2.5)
Relative Index: 0.7 (ref 0.0–2.5)
Total CK: 109 U/L (ref 7–177)
Total CK: 114 U/L (ref 7–177)
Troponin I: 0.01 ng/mL (ref 0.00–0.06)

## 2010-10-04 LAB — DIFFERENTIAL
Eosinophils Absolute: 0.1 10*3/uL (ref 0.0–0.7)
Eosinophils Relative: 1 % (ref 0–5)
Lymphocytes Relative: 24 % (ref 12–46)
Lymphs Abs: 1.1 10*3/uL (ref 0.7–4.0)
Monocytes Absolute: 0.5 10*3/uL (ref 0.1–1.0)
Monocytes Relative: 11 % (ref 3–12)

## 2010-10-04 LAB — RAPID URINE DRUG SCREEN, HOSP PERFORMED
Benzodiazepines: NOT DETECTED
Cocaine: NOT DETECTED
Opiates: NOT DETECTED
Tetrahydrocannabinol: NOT DETECTED

## 2010-10-04 LAB — URINALYSIS, ROUTINE W REFLEX MICROSCOPIC
Glucose, UA: NEGATIVE mg/dL
Hgb urine dipstick: NEGATIVE
Ketones, ur: NEGATIVE mg/dL
Specific Gravity, Urine: 1.03 (ref 1.005–1.030)
pH: 5 (ref 5.0–8.0)

## 2010-10-04 LAB — CBC
HCT: 32 % — ABNORMAL LOW (ref 36.0–46.0)
Hemoglobin: 11.1 g/dL — ABNORMAL LOW (ref 12.0–15.0)
MCHC: 34.3 g/dL (ref 30.0–36.0)
Platelets: 194 10*3/uL (ref 150–400)
RBC: 3.18 MIL/uL — ABNORMAL LOW (ref 3.87–5.11)
RBC: 3.39 MIL/uL — ABNORMAL LOW (ref 3.87–5.11)
RDW: 13.9 % (ref 11.5–15.5)
WBC: 4.4 10*3/uL (ref 4.0–10.5)

## 2010-10-04 LAB — D-DIMER, QUANTITATIVE: D-Dimer, Quant: 0.92 ug/mL-FEU — ABNORMAL HIGH (ref 0.00–0.48)

## 2010-10-09 LAB — DIFFERENTIAL
Eosinophils Absolute: 0 10*3/uL (ref 0.0–0.7)
Lymphocytes Relative: 13 % (ref 12–46)
Lymphs Abs: 0.5 10*3/uL — ABNORMAL LOW (ref 0.7–4.0)
Monocytes Relative: 12 % (ref 3–12)
Neutro Abs: 2.9 10*3/uL (ref 1.7–7.7)
Neutrophils Relative %: 74 % (ref 43–77)

## 2010-10-09 LAB — COMPREHENSIVE METABOLIC PANEL
BUN: 7 mg/dL (ref 6–23)
CO2: 24 mEq/L (ref 19–32)
Calcium: 9.6 mg/dL (ref 8.4–10.5)
Creatinine, Ser: 0.63 mg/dL (ref 0.4–1.2)
GFR calc non Af Amer: >60 mL/min (ref >60)
Glucose, Bld: 90 mg/dL (ref 70–99)
Total Protein: 6.1 g/dL (ref 6.0–8.3)

## 2010-10-09 LAB — MAGNESIUM: Magnesium: 1.9 mg/dL (ref 1.5–2.5)

## 2010-10-09 LAB — CBC
Hemoglobin: 10.7 g/dL — ABNORMAL LOW (ref 12.0–15.0)
MCHC: 33.2 g/dL (ref 30.0–36.0)
MCV: 99.2 fL (ref 78.0–100.0)
RBC: 3.24 MIL/uL — ABNORMAL LOW (ref 3.87–5.11)
RDW: 16.8 % — ABNORMAL HIGH (ref 11.5–15.5)

## 2010-10-10 LAB — CBC
HCT: 32.8 % — ABNORMAL LOW (ref 36.0–46.0)
Hemoglobin: 11.4 g/dL — ABNORMAL LOW (ref 12.0–15.0)
Platelets: 139 10*3/uL — ABNORMAL LOW (ref 150–400)
RBC: 3.4 MIL/uL — ABNORMAL LOW (ref 3.87–5.11)
WBC: 4.7 10*3/uL (ref 4.0–10.5)

## 2010-10-10 LAB — DIFFERENTIAL
Basophils Relative: 0 % (ref 0–1)
Eosinophils Absolute: 0 10*3/uL (ref 0.0–0.7)
Eosinophils Relative: 1 % (ref 0–5)
Lymphs Abs: 0.6 10*3/uL — ABNORMAL LOW (ref 0.7–4.0)
Neutrophils Relative %: 72 % (ref 43–77)

## 2010-10-10 LAB — POCT I-STAT, CHEM 8
Creatinine, Ser: 0.9 mg/dL (ref 0.4–1.2)
HCT: 32 % — ABNORMAL LOW (ref 36.0–46.0)
Hemoglobin: 10.9 g/dL — ABNORMAL LOW (ref 12.0–15.0)
Potassium: 3.7 mEq/L (ref 3.5–5.1)
Sodium: 141 mEq/L (ref 135–145)
TCO2: 22 mmol/L (ref 0–100)

## 2010-10-29 ENCOUNTER — Other Ambulatory Visit (HOSPITAL_COMMUNITY): Payer: Self-pay | Admitting: Emergency Medicine

## 2010-10-29 DIAGNOSIS — Z1231 Encounter for screening mammogram for malignant neoplasm of breast: Secondary | ICD-10-CM

## 2010-11-05 ENCOUNTER — Ambulatory Visit (HOSPITAL_COMMUNITY): Payer: Medicare HMO

## 2010-11-09 ENCOUNTER — Ambulatory Visit (HOSPITAL_COMMUNITY)
Admission: RE | Admit: 2010-11-09 | Discharge: 2010-11-09 | Disposition: A | Discharge status: Home / Self Care | Payer: Medicare HMO | Source: Ambulatory Visit | Attending: Emergency Medicine | Admitting: Emergency Medicine

## 2010-11-09 DIAGNOSIS — Z1231 Encounter for screening mammogram for malignant neoplasm of breast: Secondary | ICD-10-CM

## 2010-11-16 NOTE — Op Note (Signed)
[place]  Patient:    [name], [name] Visit Number: [id number] MRN: [id number]          Service Type: DSU Location: [facility] Attending Physician:  Alinda Deem Dictated by:   Alinda Deem, M.D. Proc. Date: 03/24/01 Admit Date:  03/24/2001 Discharge Date: 03/24/2001                             Operative Report  PREOPERATIVE DIAGNOSIS:  Left thumb finger trigger.  POSTOPERATIVE DIAGNOSIS:  Left thumb finger trigger.  OPERATION PERFORMED:  Left thumb finger trigger release.  SURGEON:  Alinda Deem, M.D.  ASSISTANT:  None.  ANESTHESIA:  IV regional.  ESTIMATED BLOOD LOSS:  Minimal.  FLUID REPLACEMENT:  500 cc of crystalloid.  DRAINS:  None.  TOURNIQUET TIME:  23 minutes.  INDICATIONS FOR PROCEDURE:  The patient is a 60 year old woman with a symptomatic left trigger thumb.  She has failed conservative treatment and still has a thumb that hangs up on a regular basis with pain.  She desires elective trigger thumb release and has a clinical examination consistent with a nodule hanging up underneath the A-1 pulley.  DESCRIPTION OF PROCEDURE:  The patient was identified by arm band and taken to the operating room at [facility] where the appropriate anesthetic monitors were attached and IV regional anesthesia induced with the patient in the supine position to the left forearm and hand.  We then made a transverse incision over the A-1 pulley of the left thumb and immediately identified the A-1 pulley right below the skin.  We were careful to protect the neurovascular bundles using a #15 blade.  A rectangular section of the A-1 pulley was removed completing the trigger thumb release.  We had the patient actively move the thumb back and forth to confirm the complete release without any hang-ups.  At this point the wound was washed out with normal saline solution and closed with running 5-0 nylon suture.  A soft  dressing of Xeroform, 2 x 2 dressing sponges, 2 inch Webril and an Ace wrap applied.  The patient was taken to the recovery room after letting the tourniquet down without difficulty. Dictated by:   Alinda Deem, M.D. Attending Physician:  Alinda Deem DD:  04/27/01 TD:  04/27/01 Job: [id number]

## 2010-11-16 NOTE — Op Note (Signed)
[name], [name]            ACCOUNT NO.:  [id number]   MEDICAL RECORD NO.:  [id number]          PATIENT TYPE:  AMB   LOCATION:  ENDO                         FACILITY:  [facility]   PHYSICIAN:  Graylin Shiver, M.D.   DATE OF BIRTH:  07/13/1950   DATE OF PROCEDURE:  04/17/2005  DATE OF DISCHARGE:                                 OPERATIVE REPORT   PROCEDURE:  Colonoscopy.   INDICATIONS:  Rectal bleeding.   Informed consent was obtained after explanation of the risks of bleeding,  infection and perforation.   PREMEDICATION:  Fentanyl 75 mcg IV, Versed 6 mg IV.   DESCRIPTION OF PROCEDURE:  With the patient in the left lateral decubitus  position, a rectal exam was performed, no masses were felt. There were some  external hemorrhoidal tags. The Olympus colonoscope was inserted into the  rectum and advanced around the colon to the cecum. Cecal landmarks were  identified. The cecum and ascending colon were normal. The transverse colon  normal. The descending colon, sigmoid and rectum were normal. The scope was  retroflexed in the rectum. She tolerated the procedure well without  complications.   IMPRESSION:  Normal colonoscopy to the cecum with external hemorrhoids which  I believe would be the source of her intermittent rectal bleeding.           ______________________________  Graylin Shiver, M.D.     SFG/MEDQ  D:  04/17/2005  T:  04/18/2005  Job:  [id number]   cc:   Lovenia Kim, D.O.  Fax: [phone]

## 2010-12-19 ENCOUNTER — Other Ambulatory Visit: Payer: Self-pay | Admitting: Internal Medicine

## 2010-12-19 ENCOUNTER — Encounter (HOSPITAL_BASED_OUTPATIENT_CLINIC_OR_DEPARTMENT_OTHER): Payer: Medicare HMO | Admitting: Internal Medicine

## 2010-12-19 DIAGNOSIS — C9000 Multiple myeloma not having achieved remission: Secondary | ICD-10-CM

## 2010-12-19 DIAGNOSIS — Z452 Encounter for adjustment and management of vascular access device: Secondary | ICD-10-CM

## 2010-12-19 LAB — CBC WITH DIFFERENTIAL/PLATELET
EOS%: 1.5 % (ref 0.0–7.0)
Eosinophils Absolute: 0.1 10*3/uL (ref 0.0–0.5)
HGB: 11.8 g/dL (ref 11.6–15.9)
MCH: 31 pg (ref 25.1–34.0)
MCV: 91.9 fL (ref 79.5–101.0)
MONO%: 7 % (ref 0.0–14.0)
NEUT#: 2.2 10*3/uL (ref 1.5–6.5)
RBC: 3.81 10*6/uL (ref 3.70–5.45)
RDW: 13.4 % (ref 11.2–14.5)
lymph#: 2.1 10*3/uL (ref 0.9–3.3)

## 2010-12-20 LAB — COMPREHENSIVE METABOLIC PANEL WITH GFR
ALT: 17 U/L (ref 0–35)
AST: 15 U/L (ref 0–37)
Albumin: 4.3 g/dL (ref 3.5–5.2)
Alkaline Phosphatase: 75 U/L (ref 39–117)
BUN: 13 mg/dL (ref 6–23)
CO2: 27 meq/L (ref 19–32)
Calcium: 9.4 mg/dL (ref 8.4–10.5)
Chloride: 103 meq/L (ref 96–112)
Creatinine, Ser: 0.8 mg/dL (ref 0.50–1.10)
Glucose, Bld: 87 mg/dL (ref 70–99)
Potassium: 3.7 meq/L (ref 3.5–5.3)
Sodium: 137 meq/L (ref 135–145)
Total Bilirubin: 0.8 mg/dL (ref 0.3–1.2)
Total Protein: 7.4 g/dL (ref 6.0–8.3)

## 2010-12-20 LAB — KAPPA/LAMBDA LIGHT CHAINS
Kappa free light chain: 2.12 mg/dL — ABNORMAL HIGH (ref 0.33–1.94)
Kappa:Lambda Ratio: 0.19 — ABNORMAL LOW (ref 0.26–1.65)
Lambda Free Lght Chn: 11 mg/dL — ABNORMAL HIGH (ref 0.57–2.63)

## 2010-12-20 LAB — IGG, IGA, IGM: IgM, Serum: 36 mg/dL — ABNORMAL LOW (ref 52–322)

## 2010-12-20 LAB — LACTATE DEHYDROGENASE: LDH: 126 U/L (ref 94–250)

## 2010-12-20 LAB — BETA 2 MICROGLOBULIN, SERUM: Beta-2 Microglobulin: 1.83 mg/L — ABNORMAL HIGH (ref 1.01–1.73)

## 2010-12-26 ENCOUNTER — Other Ambulatory Visit: Payer: Self-pay | Admitting: Internal Medicine

## 2010-12-26 ENCOUNTER — Ambulatory Visit (HOSPITAL_COMMUNITY)
Admission: RE | Admit: 2010-12-26 | Discharge: 2010-12-26 | Disposition: A | Discharge status: Home / Self Care | Payer: Medicare HMO | Source: Ambulatory Visit | Attending: Internal Medicine | Admitting: Internal Medicine

## 2010-12-26 ENCOUNTER — Encounter (HOSPITAL_BASED_OUTPATIENT_CLINIC_OR_DEPARTMENT_OTHER): Payer: Medicare HMO | Admitting: Internal Medicine

## 2010-12-26 DIAGNOSIS — M899 Disorder of bone, unspecified: Secondary | ICD-10-CM

## 2010-12-26 DIAGNOSIS — C9000 Multiple myeloma not having achieved remission: Secondary | ICD-10-CM | POA: Insufficient documentation

## 2010-12-26 DIAGNOSIS — R0789 Other chest pain: Secondary | ICD-10-CM | POA: Insufficient documentation

## 2010-12-26 DIAGNOSIS — M47814 Spondylosis without myelopathy or radiculopathy, thoracic region: Secondary | ICD-10-CM | POA: Insufficient documentation

## 2011-01-25 ENCOUNTER — Other Ambulatory Visit: Payer: Self-pay | Admitting: Internal Medicine

## 2011-01-25 DIAGNOSIS — M546 Pain in thoracic spine: Secondary | ICD-10-CM

## 2011-01-25 DIAGNOSIS — N63 Unspecified lump in unspecified breast: Secondary | ICD-10-CM

## 2011-01-31 ENCOUNTER — Other Ambulatory Visit: Payer: Medicare HMO

## 2011-01-31 ENCOUNTER — Ambulatory Visit
Admission: RE | Admit: 2011-01-31 | Discharge: 2011-01-31 | Disposition: A | Discharge status: Home / Self Care | Payer: Medicare HMO | Source: Ambulatory Visit | Attending: Internal Medicine | Admitting: Internal Medicine

## 2011-01-31 DIAGNOSIS — N63 Unspecified lump in unspecified breast: Secondary | ICD-10-CM

## 2011-02-04 ENCOUNTER — Other Ambulatory Visit: Payer: Medicare HMO

## 2011-02-04 ENCOUNTER — Ambulatory Visit
Admission: RE | Admit: 2011-02-04 | Discharge: 2011-02-04 | Disposition: A | Discharge status: Home / Self Care | Payer: Medicare HMO | Source: Ambulatory Visit | Attending: Internal Medicine | Admitting: Internal Medicine

## 2011-02-04 DIAGNOSIS — M546 Pain in thoracic spine: Secondary | ICD-10-CM

## 2011-02-04 MED ORDER — GADOBENATE DIMEGLUMINE 529 MG/ML IV SOLN
18.0000 mL | Freq: Once | INTRAVENOUS | Status: AC | PRN
  Administered 2011-02-04: 18 mL via INTRAVENOUS

## 2011-02-06 ENCOUNTER — Other Ambulatory Visit: Payer: Self-pay | Admitting: Internal Medicine

## 2011-02-06 ENCOUNTER — Encounter (HOSPITAL_BASED_OUTPATIENT_CLINIC_OR_DEPARTMENT_OTHER): Payer: Medicare HMO | Admitting: Internal Medicine

## 2011-02-06 DIAGNOSIS — Z452 Encounter for adjustment and management of vascular access device: Secondary | ICD-10-CM

## 2011-02-06 DIAGNOSIS — C9000 Multiple myeloma not having achieved remission: Secondary | ICD-10-CM

## 2011-02-06 LAB — CBC WITH DIFFERENTIAL/PLATELET
Basophils Absolute: 0 10*3/uL (ref 0.0–0.1)
Eosinophils Absolute: 0.1 10*3/uL (ref 0.0–0.5)
HCT: 32.9 % — ABNORMAL LOW (ref 34.8–46.6)
HGB: 11.4 g/dL — ABNORMAL LOW (ref 11.6–15.9)
LYMPH%: 29.5 % (ref 14.0–49.7)
MONO#: 0.4 10*3/uL (ref 0.1–0.9)
NEUT#: 3.2 10*3/uL (ref 1.5–6.5)
NEUT%: 61.2 % (ref 38.4–76.8)
Platelets: 233 10*3/uL (ref 145–400)
RBC: 3.51 10*6/uL — ABNORMAL LOW (ref 3.70–5.45)
WBC: 5.2 10*3/uL (ref 3.9–10.3)
nRBC: 0 % (ref 0–0)

## 2011-02-18 ENCOUNTER — Ambulatory Visit
Admission: RE | Admit: 2011-02-18 | Discharge: 2011-02-18 | Disposition: A | Discharge status: Home / Self Care | Payer: Medicare HMO | Source: Ambulatory Visit | Attending: Radiation Oncology | Admitting: Radiation Oncology

## 2011-02-18 DIAGNOSIS — K148 Other diseases of tongue: Secondary | ICD-10-CM | POA: Insufficient documentation

## 2011-02-18 DIAGNOSIS — C9000 Multiple myeloma not having achieved remission: Secondary | ICD-10-CM | POA: Insufficient documentation

## 2011-02-18 DIAGNOSIS — J029 Acute pharyngitis, unspecified: Secondary | ICD-10-CM | POA: Insufficient documentation

## 2011-02-18 DIAGNOSIS — Z510 Encounter for antineoplastic radiation therapy: Secondary | ICD-10-CM | POA: Insufficient documentation

## 2011-02-18 DIAGNOSIS — M549 Dorsalgia, unspecified: Secondary | ICD-10-CM | POA: Insufficient documentation

## 2011-02-18 DIAGNOSIS — Z79899 Other long term (current) drug therapy: Secondary | ICD-10-CM | POA: Insufficient documentation

## 2011-02-18 DIAGNOSIS — R110 Nausea: Secondary | ICD-10-CM | POA: Insufficient documentation

## 2011-02-18 DIAGNOSIS — M542 Cervicalgia: Secondary | ICD-10-CM | POA: Insufficient documentation

## 2011-02-22 ENCOUNTER — Other Ambulatory Visit: Payer: Self-pay | Admitting: Radiation Oncology

## 2011-02-27 ENCOUNTER — Ambulatory Visit (HOSPITAL_COMMUNITY)
Admission: RE | Admit: 2011-02-27 | Discharge: 2011-02-27 | Disposition: A | Discharge status: Home / Self Care | Payer: Medicare HMO | Source: Ambulatory Visit | Attending: Radiation Oncology | Admitting: Radiation Oncology

## 2011-02-27 DIAGNOSIS — M5124 Other intervertebral disc displacement, thoracic region: Secondary | ICD-10-CM | POA: Insufficient documentation

## 2011-02-27 DIAGNOSIS — M5030 Other cervical disc degeneration, unspecified cervical region: Secondary | ICD-10-CM | POA: Insufficient documentation

## 2011-02-27 DIAGNOSIS — M47812 Spondylosis without myelopathy or radiculopathy, cervical region: Secondary | ICD-10-CM | POA: Insufficient documentation

## 2011-02-27 DIAGNOSIS — C9000 Multiple myeloma not having achieved remission: Secondary | ICD-10-CM | POA: Insufficient documentation

## 2011-02-27 MED ORDER — GADOBENATE DIMEGLUMINE 529 MG/ML IV SOLN
20.0000 mL | Freq: Once | INTRAVENOUS | Status: AC | PRN
  Administered 2011-02-27: 20 mL via INTRAVENOUS

## 2011-03-20 ENCOUNTER — Encounter (HOSPITAL_BASED_OUTPATIENT_CLINIC_OR_DEPARTMENT_OTHER): Payer: Medicare HMO | Admitting: Internal Medicine

## 2011-03-20 ENCOUNTER — Other Ambulatory Visit: Payer: Self-pay | Admitting: Internal Medicine

## 2011-03-20 DIAGNOSIS — Z452 Encounter for adjustment and management of vascular access device: Secondary | ICD-10-CM

## 2011-03-20 DIAGNOSIS — C9000 Multiple myeloma not having achieved remission: Secondary | ICD-10-CM

## 2011-03-20 LAB — CBC WITH DIFFERENTIAL/PLATELET
EOS%: 1.1 % (ref 0.0–7.0)
Eosinophils Absolute: 0 10*3/uL (ref 0.0–0.5)
MCV: 92.6 fL (ref 79.5–101.0)
MONO%: 10.8 % (ref 0.0–14.0)
NEUT#: 2.4 10*3/uL (ref 1.5–6.5)
RBC: 3.36 10*6/uL — ABNORMAL LOW (ref 3.70–5.45)
RDW: 13.5 % (ref 11.2–14.5)
WBC: 3.5 10*3/uL — ABNORMAL LOW (ref 3.9–10.3)
lymph#: 0.7 10*3/uL — ABNORMAL LOW (ref 0.9–3.3)

## 2011-03-21 LAB — COMPREHENSIVE METABOLIC PANEL
AST: 16 U/L (ref 0–37)
Albumin: 4.2 g/dL (ref 3.5–5.2)
Alkaline Phosphatase: 47 U/L (ref 39–117)
BUN: 12 mg/dL (ref 6–23)
Creatinine, Ser: 0.82 mg/dL (ref 0.50–1.10)
Glucose, Bld: 102 mg/dL — ABNORMAL HIGH (ref 70–99)
Total Bilirubin: 1.1 mg/dL (ref 0.3–1.2)

## 2011-03-21 LAB — IGG, IGA, IGM
IgA: 177 mg/dL (ref 69–380)
IgG (Immunoglobin G), Serum: 2100 mg/dL — ABNORMAL HIGH (ref 690–1700)
IgM, Serum: 22 mg/dL — ABNORMAL LOW (ref 52–322)

## 2011-03-21 LAB — BETA 2 MICROGLOBULIN, SERUM: Beta-2 Microglobulin: 1.75 mg/L — ABNORMAL HIGH (ref 1.01–1.73)

## 2011-03-21 LAB — KAPPA/LAMBDA LIGHT CHAINS
Kappa free light chain: 1.5 mg/dL (ref 0.33–1.94)
Lambda Free Lght Chn: 22.5 mg/dL — ABNORMAL HIGH (ref 0.57–2.63)

## 2011-03-22 LAB — CBC
MCHC: 33.8
MCV: 94.4
RBC: 3.33 — ABNORMAL LOW
RDW: 16 — ABNORMAL HIGH

## 2011-03-22 LAB — DIFFERENTIAL
Basophils Absolute: 0.1
Basophils Relative: 2 — ABNORMAL HIGH
Eosinophils Absolute: 0.1
Monocytes Relative: 11
Neutro Abs: 3.4
Neutrophils Relative %: 56

## 2011-03-22 LAB — BONE MARROW EXAM

## 2011-03-22 LAB — CHROMOSOME ANALYSIS, BONE MARROW

## 2011-03-23 ENCOUNTER — Inpatient Hospital Stay (HOSPITAL_COMMUNITY)
Admission: EM | Admit: 2011-03-23 | Discharge: 2011-03-27 | DRG: 392 | Disposition: A | Discharge status: Home / Self Care | Payer: Medicare HMO | Attending: Internal Medicine | Admitting: Internal Medicine

## 2011-03-23 ENCOUNTER — Emergency Department (HOSPITAL_COMMUNITY): Payer: Medicare HMO

## 2011-03-23 DIAGNOSIS — D649 Anemia, unspecified: Secondary | ICD-10-CM | POA: Diagnosis present

## 2011-03-23 DIAGNOSIS — E876 Hypokalemia: Secondary | ICD-10-CM | POA: Diagnosis present

## 2011-03-23 DIAGNOSIS — I10 Essential (primary) hypertension: Secondary | ICD-10-CM | POA: Diagnosis present

## 2011-03-23 DIAGNOSIS — R1310 Dysphagia, unspecified: Principal | ICD-10-CM | POA: Diagnosis present

## 2011-03-23 DIAGNOSIS — C9000 Multiple myeloma not having achieved remission: Secondary | ICD-10-CM | POA: Diagnosis present

## 2011-03-23 DIAGNOSIS — Z7982 Long term (current) use of aspirin: Secondary | ICD-10-CM

## 2011-03-23 DIAGNOSIS — E039 Hypothyroidism, unspecified: Secondary | ICD-10-CM | POA: Diagnosis present

## 2011-03-23 DIAGNOSIS — M549 Dorsalgia, unspecified: Secondary | ICD-10-CM | POA: Diagnosis present

## 2011-03-23 LAB — POCT I-STAT, CHEM 8
Chloride: 102 mEq/L (ref 96–112)
Glucose, Bld: 102 mg/dL — ABNORMAL HIGH (ref 70–99)
HCT: 34 % — ABNORMAL LOW (ref 36.0–46.0)
Hemoglobin: 11.6 g/dL — ABNORMAL LOW (ref 12.0–15.0)
Potassium: 3.3 mEq/L — ABNORMAL LOW (ref 3.5–5.1)
Sodium: 139 mEq/L (ref 135–145)

## 2011-03-23 LAB — DIFFERENTIAL
Basophils Absolute: 0 10*3/uL (ref 0.0–0.1)
Eosinophils Relative: 1 % (ref 0–5)
Lymphocytes Relative: 14 % (ref 12–46)
Neutrophils Relative %: 71 % (ref 43–77)

## 2011-03-23 LAB — CBC
HCT: 32.2 % — ABNORMAL LOW (ref 36.0–46.0)
Platelets: 247 10*3/uL (ref 150–400)
RBC: 3.47 MIL/uL — ABNORMAL LOW (ref 3.87–5.11)
RDW: 13.3 % (ref 11.5–15.5)
WBC: 3.8 10*3/uL — ABNORMAL LOW (ref 4.0–10.5)

## 2011-03-24 LAB — BASIC METABOLIC PANEL
CO2: 25 mEq/L (ref 19–32)
Chloride: 102 mEq/L (ref 96–112)
Sodium: 135 mEq/L (ref 135–145)

## 2011-03-24 LAB — CBC
Hemoglobin: 9.9 g/dL — ABNORMAL LOW (ref 12.0–15.0)
MCV: 94.8 fL (ref 78.0–100.0)
Platelets: 214 10*3/uL (ref 150–400)
RBC: 3.07 MIL/uL — ABNORMAL LOW (ref 3.87–5.11)
WBC: 3.5 10*3/uL — ABNORMAL LOW (ref 4.0–10.5)

## 2011-03-24 LAB — URINALYSIS, ROUTINE W REFLEX MICROSCOPIC
Bilirubin Urine: NEGATIVE
Glucose, UA: NEGATIVE mg/dL
Ketones, ur: NEGATIVE mg/dL
Protein, ur: NEGATIVE mg/dL

## 2011-03-24 LAB — OCCULT BLOOD X 1 CARD TO LAB, STOOL: Fecal Occult Bld: NEGATIVE

## 2011-03-24 LAB — VITAMIN B12: Vitamin B-12: 542 pg/mL (ref 211–911)

## 2011-03-24 LAB — IRON AND TIBC
Iron: 64 ug/dL (ref 42–135)
TIBC: 253 ug/dL (ref 250–470)

## 2011-03-25 LAB — CBC
MCH: 32.7 pg (ref 26.0–34.0)
Platelets: 197 10*3/uL (ref 150–400)
RBC: 2.97 MIL/uL — ABNORMAL LOW (ref 3.87–5.11)
WBC: 3.7 10*3/uL — ABNORMAL LOW (ref 4.0–10.5)

## 2011-03-25 LAB — BASIC METABOLIC PANEL
CO2: 24 mEq/L (ref 19–32)
Calcium: 9.4 mg/dL (ref 8.4–10.5)
Sodium: 135 mEq/L (ref 135–145)

## 2011-03-26 LAB — DIFFERENTIAL
Basophils Absolute: 0 10*3/uL (ref 0.0–0.1)
Basophils Relative: 0 % (ref 0–1)
Monocytes Relative: 16 % — ABNORMAL HIGH (ref 3–12)
Neutro Abs: 1.8 10*3/uL (ref 1.7–7.7)
Neutrophils Relative %: 60 % (ref 43–77)

## 2011-03-26 LAB — CBC
Hemoglobin: 10 g/dL — ABNORMAL LOW (ref 12.0–15.0)
MCH: 31.9 pg (ref 26.0–34.0)
RBC: 3.13 MIL/uL — ABNORMAL LOW (ref 3.87–5.11)

## 2011-03-27 NOTE — H&P (Signed)
[name], [name]            ACCOUNT NO.:  [id number]  MEDICAL RECORD NO.:  [id number]  LOCATION:  1508                         FACILITY:  [facility]  PHYSICIAN:  Alvino Blood, MD      DATE OF BIRTH:  [birth year]  DATE OF ADMISSION:  03/23/2011 DATE OF DISCHARGE:                             HISTORY & PHYSICAL   PRIMARY CARE PROVIDER:  [name], PA  CHIEF COMPLAINT:  Sore throat and difficulty swallowing.  HISTORY OF PRESENT ILLNESS:  [name] is a 60 year old African American female with a known history of multiple myeloma, which was diagnosed in 2008.  She has undergoing chemotherapy, radiation therapy, and bone marrow transplant since then.  She has just completed her 2nd course of 15 cycles of radiation therapy about 3 days ago.  She presents with sore throat and difficulty swallowing, which started about 5 days ago.  It was of gradual onset and progressively gotten worse to the point where she cannot swallow and if she tries to swallow, she has severe pain.  This is also associated with nausea and emesis especially over the past 3 days.  She has been unable to keep down any food or drink.  She has also noticed some specks of blood as well as some black spots on her tongue for the past 3 days.  She had an episode of "light blood in her stool" 4 days ago.  She denies any abdominal pain or diarrhea.  Her last bowel movement was yesterday.  She has had decreased urinary output since today.  She denies any fever, but admits to neck pain when she swallows and at times it occurs spontaneously.  She also admits to dizziness and right shoulder pain radiating anteriorly.  This is chronic, but seemed to have gotten worse slightly and relieved by oxycodone.  In the emergency room today, soft tissue of the neck imaging was done, which did not show any abnormalities.  She was given viscous lidocaine and some analgesia, and referred to the medical service for further management.   On further questioning, she denies any chest pains, palpitations, abdominal pain, diarrhea, dysuria, or other joint pains except for her right shoulder.  PAST MEDICAL HISTORY: 1. Multiple myeloma diagnosed in 2009.  She has undergone radiation     therapy and chemotherapy, and has just completed a 2nd cycle of     radiation therapy about 3 days ago. 2. Chronic back pain. 3. Hypothyroidism.  PAST SURGICAL HISTORY: 1. Bone marrow transplant. 2. Cholecystectomy. 3. Hysterectomy.  ALLERGIES:  No known drug allergies.  MEDICATIONS: 1. Synthroid 100 mcg orally daily. 2. Gabapentin 300 mg orally four times daily. 3. OxyCodone 20 mg orally twice daily. 4. Vicodin 1 tablet orally every four hours as need for pain. 5. Hydrochlorothiazide 25 mg orally daily, which she takes for     swelling of her legs, but she denies any history of hypertension. 6. Compazine. 7. Protonix 40 mg orally every 12 hours.  FAMILY HISTORY:  Positive for heart disease and breast cancer in her grandmother.  SOCIAL HISTORY:  She is married, lives with her family.  She denies any tobacco, alcohol, or illicit drug use.  REVIEW OF SYSTEMS:  All systems are reviewed and are negative except as mentioned in the history of present illness.  PHYSICAL EXAMINATION:  VITAL SIGNS:  Temperature 97.8, pulse 88, respiratory rate 16, blood pressure 125/75, oxygen saturation 100%. GENERAL:  This is a middle-aged [place] American female who looks ill, but not in any acute distress. HEENT:  The head is normocephalic and atraumatic.  There is equal ocular movement bilaterally.  The external ears and nose appear normal.  The oropharyngeal mucosa reveals erythema and some black spots without any oral thrush. NECK:  Supple with tenderness of the root of the neck bilaterally without any cervical adenopathy.  There is no evidence of thyromegaly or jugular venous distention. CHEST:  Clear to auscultation without any wheeze or  rales.  There is good air entry and respiratory effort bilaterally. CARDIOVASCULAR:  Normal 1st and 2nd heart sounds are heard, which are regular in rate and rhythm without any murmurs, rubs, or gallops. ABDOMEN:  Soft, nontender, nondistended.  Bowel sounds present.  There is no palpable hepatosplenomegaly. CNS:  Alert and oriented x3 without any focal neurological deficits. EXTREMITIES:  Peripheral pulses are palpable.  There is no evidence of peripheral edema, calf tenderness, or tenosynovitis. SKIN:  Warm and dry without any active rash or lesions.  LABORATORY DATA:  CBC, WBC 3.8, hemoglobin 11.2, hematocrit 32.2, platelet count 247, neutrophils 71%.  Metabolic panel, sodium 139, potassium 3.3, chloride 102, CO2 24, BUN 8, creatinine 0.8, ionized calcium 1.19, glucose 102.  IMAGING STUDIES: 1. Chest x-ray, this does not show any evidence of acute     cardiopulmonary disease. 2. Soft tissue of the neck imaging did not show any acute     abnormalities except for degenerative disk disease and spondylosis     of C5-C6 and C6-C7 level.  ASSESSMENT: 1. Acute pharyngitis and odynophagia likely secondary to mucositis from    recent radiation therapy. 2. Difficulty swallowing secondary to acute pharyngitis and     odynophagia. 3. Mild normocytic anemia. 4. Mild hypokalemia. 5. Multiple myeloma, status post chemotherapy, radiation therapy, and     bone marrow transplant.  PLAN:  The patient has been placed on observation on the medical service for further management, which will include: 1. She will be given viscous lidocaine gel as well as Magic mouthwash     as needed for sore throat and sore mouth. 2. She will be placed on IV fluid hydration with potassium     supplementation. 3. She will be given clear liquid diet and this will be advanced as     tolerated. 4. Iron, TIBC, folic acid level, vitamin B12, and Hemoccult x3 will be     done because of the anemia. 5. For DVT  prophylaxis, she will be on Lovenox. 6. The rest of her home medications will be continued.  The patient's condition is guarded and she is a full code.          ______________________________ Alvino Blood, MD     SI/MEDQ  D:  03/23/2011  T:  03/23/2011  Job:  [id number]  Electronically Signed by Alvino Blood MD on 03/27/2011 02:53:50 PM

## 2011-03-28 ENCOUNTER — Encounter (HOSPITAL_BASED_OUTPATIENT_CLINIC_OR_DEPARTMENT_OTHER): Payer: Medicare HMO | Admitting: Internal Medicine

## 2011-03-28 DIAGNOSIS — C9002 Multiple myeloma in relapse: Secondary | ICD-10-CM

## 2011-03-30 LAB — CULTURE, BLOOD (SINGLE)
Culture  Setup Time: 201209232103
Culture: NO GROWTH

## 2011-04-02 LAB — CBC
HCT: 32.3 % — ABNORMAL LOW (ref 36.0–46.0)
Hemoglobin: 10.9 g/dL — ABNORMAL LOW (ref 12.0–15.0)
MCV: 97 fL (ref 78.0–100.0)
RBC: 3.33 MIL/uL — ABNORMAL LOW (ref 3.87–5.11)
WBC: 4.4 10*3/uL (ref 4.0–10.5)

## 2011-04-02 LAB — DIFFERENTIAL
Eosinophils Absolute: 0.1 10*3/uL (ref 0.0–0.7)
Eosinophils Relative: 1 % (ref 0–5)
Lymphs Abs: 1.7 10*3/uL (ref 0.7–4.0)
Monocytes Absolute: 0.5 10*3/uL (ref 0.1–1.0)
Monocytes Relative: 11 % (ref 3–12)

## 2011-04-02 LAB — CHROMOSOME ANALYSIS, BONE MARROW

## 2011-04-03 ENCOUNTER — Encounter: Payer: Self-pay | Admitting: *Deleted

## 2011-04-05 LAB — CBC
HCT: 34.3 % — ABNORMAL LOW (ref 36.0–46.0)
Hemoglobin: 11.6 g/dL — ABNORMAL LOW (ref 12.0–15.0)
RBC: 3.52 MIL/uL — ABNORMAL LOW (ref 3.87–5.11)
WBC: 4.8 10*3/uL (ref 4.0–10.5)

## 2011-04-08 ENCOUNTER — Other Ambulatory Visit: Payer: Self-pay | Admitting: Internal Medicine

## 2011-04-08 ENCOUNTER — Encounter (HOSPITAL_BASED_OUTPATIENT_CLINIC_OR_DEPARTMENT_OTHER): Payer: Medicare HMO | Admitting: Internal Medicine

## 2011-04-08 DIAGNOSIS — C9000 Multiple myeloma not having achieved remission: Secondary | ICD-10-CM

## 2011-04-08 DIAGNOSIS — Z452 Encounter for adjustment and management of vascular access device: Secondary | ICD-10-CM

## 2011-04-08 LAB — LACTATE DEHYDROGENASE: LDH: 189 U/L (ref 94–250)

## 2011-04-08 LAB — CBC WITH DIFFERENTIAL/PLATELET
Basophils Absolute: 0 10*3/uL (ref 0.0–0.1)
Eosinophils Absolute: 0 10*3/uL (ref 0.0–0.5)
HCT: 33.6 % — ABNORMAL LOW (ref 34.8–46.6)
HGB: 11.5 g/dL — ABNORMAL LOW (ref 11.6–15.9)
MCH: 31.9 pg (ref 25.1–34.0)
MCV: 93.3 fL (ref 79.5–101.0)
MONO%: 9.7 % (ref 0.0–14.0)
NEUT#: 3.1 10*3/uL (ref 1.5–6.5)
NEUT%: 72.3 % (ref 38.4–76.8)
Platelets: 268 10*3/uL (ref 145–400)
RDW: 13.7 % (ref 11.2–14.5)

## 2011-04-08 LAB — COMPREHENSIVE METABOLIC PANEL
Albumin: 4.3 g/dL (ref 3.5–5.2)
BUN: 14 mg/dL (ref 6–23)
CO2: 22 mEq/L (ref 19–32)
Glucose, Bld: 120 mg/dL — ABNORMAL HIGH (ref 70–99)
Sodium: 136 mEq/L (ref 135–145)
Total Bilirubin: 0.7 mg/dL (ref 0.3–1.2)
Total Protein: 7.8 g/dL (ref 6.0–8.3)

## 2011-04-08 NOTE — Consult Note (Signed)
[name], [name]            ACCOUNT NO.:  [id number]  MEDICAL RECORD NO.:  [id number]  LOCATION:  1508                         FACILITY:  [facility]  PHYSICIAN:  Tyrihanna Wingert L. Malon Kindle., M.D.DATE OF BIRTH:  [birth year]  DATE OF CONSULTATION:  03/26/2011 DATE OF DISCHARGE:                                CONSULTATION   REASON FOR CONSULTATION:  Severe of odynophagia of unclear cause.  HISTORY:  The patient is a very nice 60 year old woman who was diagnosed back in 2008, with multiple myeloma.  She subsequently underwent extensive chemo and radiation therapy and a bone marrow transplant with her stem cells at [facility] in approximately 2009.  She notes that she has been doing exceedingly well since that time with no problems until 2 or 3 months ago.  At that time she was found to have recurrent disease and was begun again on radiation therapy.  She just completed a cycle of radiation therapy several days ago and this was followed by severedysphagia.  It really appears that it was more severe odynophagia with mucositis in her mouth and pain with swallowing difficulty getting down even liquids having turn her head at certain positions.  She was hospitalized 3 days ago with this and with Magic mouthwash and conservative measures, IV hydration, and liquids.  She has been followed and she has continued to have worsening dysphagia.  She describes having to turn her head in a particular position that even then with difficulty passing food and liquid with severe pain.  She has had recent MRIs of the cervical spine and thoracic spine that revealed myelomatous involvement at all vertebral levels and C-spine and a portion of the brain.  She also has diffuse myelomatous involvement in the thoracic spine.  Soft tissue x-ray of the neck failed to show any obvious masses. We were asked to see the patient because she is hungry, feels well, desires to eat, and her oral mucositis appears to have  resolve with conservative therapy that she is still having severe pain with swallowing.  CURRENT MEDICATIONS: 1. Aspirin. 2. Lovenox. 3. Neurontin. 4. Hydrochlorothiazide. 5. Topical lidocaine. 6. Synthroid. 7. Oxycodone. 8. Protonix. 9. Acetaminophen. 10.Dilaudid. 11.Magic mouthwash. 12.Zofran.  ALLERGIES:  She has no drug allergies.  MEDICAL HISTORY:  Remarkable for a bone marrow transplant with her own stem cells, 2009 at [facility] with recurrent disease, followed by Dr. Shirline Frees.  She is hypothyroid.  SURGERIES:  Include a hysterectomy and cholecystectomy.  FAMILY HISTORY:  Positive for breast cancer or heart disease.  Negative for colon cancer.  PHYSICAL EXAMINATION:  VITAL SIGNS:  Temperature 98.4, pulse 72, blood pressure 110/67, and O2 sats 92%.  GENERAL:  A very pleasant African American female in no acute distress. EYES:  Sclerae nonicteric. NECK:  Nontender with no crepitus. THROAT:  There seems to be some diminished salivation, but no gross mucositis or oral ulcerations.  There is no gross evidence of fungal candidiasis. LUNGS:  Clear.  There is a Port-A-Cath in place in the right anterior chest. HEART:  Regular rate and rhythm without murmurs or gallops. ABDOMEN:  Soft and nontender.  REVIEW OF SYSTEMS:  The patient notes no problems with the chronic esophageal reflux  and no problems with dysphagia.  Up until approximately 3 weeks or so ago she was having no problems at all of swallowing.  No heartburn and indigestion.  She has not had colonoscopy. She has no known liver disease.  She has had no signs of rectal bleeding.  ASSESSMENT: 1. Odynophagia/dysphagia.  This all appears to be related to her     radiation therapy.  Certainly, the mucositis could explain it but     her oral mucositis has resolved; however, her dysphagia and     odynophagia continued.  This could be due to either fungal or viral     infection or some other reason.  PLAN:  We  will go ahead with the EGD hopefully with the pediatric scope tomorrow at 1:30 and we will make further recommendations depending on these results.          ______________________________ [name]., M.D.     Waldron Session  D:  03/26/2011  T:  03/26/2011  Job:  [id number]  cc:   Lajuana Matte, M.D.  Lucky Cowboy, M.D. Fax: [phone]  Electronically Signed by Carman Ching M.D. on 04/08/2011 12:59:44 PM

## 2011-04-08 NOTE — Op Note (Signed)
  [name], [name]            ACCOUNT NO.:  [id number]  MEDICAL RECORD NO.:  [id number]  LOCATION:                                 FACILITY:  PHYSICIAN:  Devlin Brink L. Malon Kindle., M.D.DATE OF BIRTH:  [birth year]  DATE OF PROCEDURE:  03/27/2011 DATE OF DISCHARGE:                              OPERATIVE REPORT   PROCEDURE:  Esophagogastroduodenoscopy.  MEDICATIONS: 1. Cetacaine spray. 2. Fentanyl 50 mcg. 3. Versed 4 mg IV.  INDICATIONS:  Dysphagia in a woman, who has had radiation for recurrent multiple myeloma.  She had mucositis on admission, but this has cleared up in her mouth, but she is still unable to swallow.  DESCRIPTION OF PROCEDURE:  Procedure has been explained to the patient, consent obtained.  In left lateral decubitus position, the pediatric upper endoscope was inserted bluntly and passed easily into the esophagus.  The esophagus was completely normal from the upper esophagus to the GE junction with a small hiatal hernia.  There were no signs of any infection, inflammation, and no mucositis.  A complete endoscopy was performed.  The stomach and duodenum were seen well and the entirety were normal.  The pediatric scope was used over concern of a possible mass effect or something in this nature.  The scope was withdrawn and the initial findings were confirmed.  The upper esophageal sphincter was seemed somewhat poorly, but there were no gross lesions seen.  Patient tolerated the procedure well.  ASSESSMENT:  Odynophagia and dysphagia with no obvious cause on upper endoscopy.  Patient's previous mucositis has healed completely.  PLAN:  We will proceed with Speech Pathology evaluation to see if this could be a neuromuscular issue.          ______________________________ Llana Aliment. Malon Kindle., M.D.     Waldron Session  D:  03/27/2011  T:  03/28/2011  Job:  [id number]  cc:   Lajuana Matte, M.D.  Lucky Cowboy, M.D. Fax: [phone]  Electronically Signed by  Carman Ching M.D. on 04/08/2011 12:59:54 PM

## 2011-04-09 ENCOUNTER — Encounter: Payer: Self-pay | Admitting: Certified Registered Nurse Anesthetist

## 2011-04-09 NOTE — Discharge Summary (Signed)
NAMEGOLDEN, [name]            ACCOUNT NO.:  [id number]  MEDICAL RECORD NO.:  [id number]  LOCATION:  1508                         FACILITY:  [facility]  PHYSICIAN:  Kathlen Mody, MD       DATE OF BIRTH:  12-May-1951  DATE OF ADMISSION:  03/23/2011 DATE OF DISCHARGE:  03/27/2011                              DISCHARGE SUMMARY   PRIMARY CARE PHYSICIAN:  [name], PA.  ONCOLOGIST:  Lajuana Matte, M.D.  DISCHARGE DIAGNOSES: 1. Mucositis. 2. Multiple myeloma, status post radiation and chemotherapy 3 days ago     prior to admission. 3. Hypothyroidism. 4. Chronic back pain. 5. Acute pharyngitis and odynophagia. 6. Dysphagia. 7. Normocytic anemia. 8. Hypokalemia.  DISCHARGE MEDICATIONS: 1. Lidocaine 2% viscous solution 3 times a day with meals. 2. Aspirin 81 mg daily. 3. Gabapentin 300 mg 4 times a day. 4. Hydrochlorothiazide 25 mg daily. 5. Hydrocodone 7.5/750 mg every 8 hours for 5 days. 6. Levothyroxine 100 mcg daily. 7. Oxycodone CR 12 mg twice a day. 8. Protonix 40 mg tablet, 1 tablet daily. 9. Vitamin D3 5000 units 1 tablet daily.  CONSULTATION:  Gastrointestinal consult was obtained from Dr. Randa Evens.  PROCEDURES DONE:  Patient had an upper EGD, which was within normal limits.  BRIEF HOSPITAL COURSE:  60 year old lady with history of multiple myeloma, status post radiation and chemotherapy 3 days prior to admission came in for severe difficulty swallowing, odynophagia, mucositis, acute pharyngitis, and dysphagia.  She was initially started on Magic mouthwash and viscous lidocaine gel swish and swallow over the next 24-48 hours.  Patient's pain did not improve and was put on IV pain medications and put on a clear liquid diet.  Over the course of her hospitalization, her mucositis has improved, but she presented with persistent odynophagia, at which time gastroenteritis and GI consult was obtained from Dr. Randa Evens and she underwent an upper EGD.  Her upper  EGD was within normal limits.  Her mucositis has resolved.  Dr. Randa Evens recommended if dysphagia persists, she might need a speech and swallow eval.  Patient actually after the EGD was able to tolerate soft and a regular diet and she was discharged home on lidocaine viscous gel and Magic mouthwash and pain medications as needed.  She was also recommended to follow up with her Oncology in 1-2 weeks.  Hypertension, controlled.  Hypothyroidism, continue the same dose of Synthroid.  On the day of discharge, patient's vitals were within normal limits.  Her exam was within normal limits.  PERTINENT LABS:  Patient had a CBC done on admission significant for hemoglobin of 11.2.  On the day of discharge, patient's CBC showed a hemoglobin of 10 and WBC count of 3.  Blood cultures were negative for any growth.  Urinalysis showed negative nitrites and leukocytes.  Stool for occult blood was negative.  RADIOLOGY:  Patient had a 2-view chest x-ray, which did not show any cardiopulmonary abnormalities.  Patient also had a soft tissue neck x- ray, which was normal.          ______________________________ Kathlen Mody, MD     VA/MEDQ  D:  04/08/2011  T:  04/09/2011  Job:  [id number]  Electronically Signed by  Kathlen Mody MD on 04/09/2011 10:33:42 PM

## 2011-04-10 LAB — CBC
HCT: 32.1 — ABNORMAL LOW
Platelets: 238
RBC: 3.47 — ABNORMAL LOW
WBC: 14.6 — ABNORMAL HIGH

## 2011-04-10 LAB — CHROMOSOME ANALYSIS, BONE MARROW

## 2011-04-10 LAB — DIFFERENTIAL
Lymphocytes Relative: 8 — ABNORMAL LOW
Lymphs Abs: 1.1
Monocytes Relative: 4
Neutrophils Relative %: 88 — ABNORMAL HIGH

## 2011-04-10 LAB — BONE MARROW EXAM

## 2011-04-11 ENCOUNTER — Encounter: Payer: Medicare HMO | Admitting: Internal Medicine

## 2011-04-11 ENCOUNTER — Encounter (HOSPITAL_BASED_OUTPATIENT_CLINIC_OR_DEPARTMENT_OTHER): Payer: Medicare HMO | Admitting: Internal Medicine

## 2011-04-11 DIAGNOSIS — Z5112 Encounter for antineoplastic immunotherapy: Secondary | ICD-10-CM

## 2011-04-11 DIAGNOSIS — Z5111 Encounter for antineoplastic chemotherapy: Secondary | ICD-10-CM

## 2011-04-11 DIAGNOSIS — C9000 Multiple myeloma not having achieved remission: Secondary | ICD-10-CM

## 2011-04-12 ENCOUNTER — Ambulatory Visit: Payer: Medicare HMO | Admitting: Radiation Oncology

## 2011-04-15 ENCOUNTER — Other Ambulatory Visit: Payer: Self-pay | Admitting: Internal Medicine

## 2011-04-15 ENCOUNTER — Ambulatory Visit (HOSPITAL_BASED_OUTPATIENT_CLINIC_OR_DEPARTMENT_OTHER): Payer: Medicare HMO | Admitting: Internal Medicine

## 2011-04-15 DIAGNOSIS — Z5112 Encounter for antineoplastic immunotherapy: Secondary | ICD-10-CM

## 2011-04-15 DIAGNOSIS — C9000 Multiple myeloma not having achieved remission: Secondary | ICD-10-CM

## 2011-04-15 DIAGNOSIS — Z452 Encounter for adjustment and management of vascular access device: Secondary | ICD-10-CM

## 2011-04-15 LAB — COMPREHENSIVE METABOLIC PANEL
ALT: 17 U/L (ref 0–35)
CO2: 21 mEq/L (ref 19–32)
Calcium: 9.6 mg/dL (ref 8.4–10.5)
Chloride: 103 mEq/L (ref 96–112)
Potassium: 3.6 mEq/L (ref 3.5–5.3)
Sodium: 136 mEq/L (ref 135–145)
Total Bilirubin: 0.5 mg/dL (ref 0.3–1.2)
Total Protein: 7.2 g/dL (ref 6.0–8.3)

## 2011-04-15 LAB — CBC WITH DIFFERENTIAL/PLATELET
Eosinophils Absolute: 0 10*3/uL (ref 0.0–0.5)
MONO#: 0.4 10*3/uL (ref 0.1–0.9)
NEUT#: 3.3 10*3/uL (ref 1.5–6.5)
RBC: 3.39 10*6/uL — ABNORMAL LOW (ref 3.70–5.45)
RDW: 14.1 % (ref 11.2–14.5)
WBC: 4.3 10*3/uL (ref 3.9–10.3)
lymph#: 0.6 10*3/uL — ABNORMAL LOW (ref 0.9–3.3)
nRBC: 0 % (ref 0–0)

## 2011-04-16 ENCOUNTER — Other Ambulatory Visit: Payer: Self-pay | Admitting: Internal Medicine

## 2011-04-16 DIAGNOSIS — C9000 Multiple myeloma not having achieved remission: Secondary | ICD-10-CM

## 2011-04-17 NOTE — Progress Notes (Signed)
abstraction

## 2011-04-18 ENCOUNTER — Encounter (HOSPITAL_BASED_OUTPATIENT_CLINIC_OR_DEPARTMENT_OTHER): Payer: Medicare HMO | Admitting: Internal Medicine

## 2011-04-18 DIAGNOSIS — C9000 Multiple myeloma not having achieved remission: Secondary | ICD-10-CM

## 2011-04-18 DIAGNOSIS — Z5112 Encounter for antineoplastic immunotherapy: Secondary | ICD-10-CM

## 2011-04-22 ENCOUNTER — Other Ambulatory Visit: Payer: Self-pay | Admitting: Internal Medicine

## 2011-04-22 ENCOUNTER — Encounter (HOSPITAL_BASED_OUTPATIENT_CLINIC_OR_DEPARTMENT_OTHER): Payer: Medicare HMO | Admitting: Internal Medicine

## 2011-04-22 DIAGNOSIS — C9000 Multiple myeloma not having achieved remission: Secondary | ICD-10-CM

## 2011-04-22 DIAGNOSIS — Z452 Encounter for adjustment and management of vascular access device: Secondary | ICD-10-CM

## 2011-04-22 LAB — COMPREHENSIVE METABOLIC PANEL
Alkaline Phosphatase: 63 U/L (ref 39–117)
BUN: 17 mg/dL (ref 6–23)
Glucose, Bld: 113 mg/dL — ABNORMAL HIGH (ref 70–99)
Sodium: 136 mEq/L (ref 135–145)
Total Bilirubin: 0.6 mg/dL (ref 0.3–1.2)

## 2011-04-22 LAB — CBC WITH DIFFERENTIAL/PLATELET
EOS%: 0.3 % (ref 0.0–7.0)
Eosinophils Absolute: 0 10*3/uL (ref 0.0–0.5)
LYMPH%: 11.2 % — ABNORMAL LOW (ref 14.0–49.7)
MCH: 33.5 pg (ref 25.1–34.0)
MCV: 96.9 fL (ref 79.5–101.0)
MONO%: 3.5 % (ref 0.0–14.0)
NEUT#: 3 10*3/uL (ref 1.5–6.5)
Platelets: 110 10*3/uL — ABNORMAL LOW (ref 145–400)
RBC: 3.4 10*6/uL — ABNORMAL LOW (ref 3.70–5.45)

## 2011-04-26 ENCOUNTER — Ambulatory Visit: Payer: Medicare HMO | Admitting: Radiation Oncology

## 2011-04-29 ENCOUNTER — Other Ambulatory Visit: Payer: Self-pay | Admitting: Internal Medicine

## 2011-04-29 ENCOUNTER — Encounter (HOSPITAL_BASED_OUTPATIENT_CLINIC_OR_DEPARTMENT_OTHER): Payer: Medicare HMO | Admitting: Internal Medicine

## 2011-04-29 ENCOUNTER — Telehealth: Payer: Self-pay | Admitting: Internal Medicine

## 2011-04-29 DIAGNOSIS — C9000 Multiple myeloma not having achieved remission: Secondary | ICD-10-CM

## 2011-04-29 DIAGNOSIS — Z5112 Encounter for antineoplastic immunotherapy: Secondary | ICD-10-CM

## 2011-04-29 LAB — COMPREHENSIVE METABOLIC PANEL
AST: 20 U/L (ref 0–37)
Albumin: 3.9 g/dL (ref 3.5–5.2)
Alkaline Phosphatase: 64 U/L (ref 39–117)
Calcium: 9.2 mg/dL (ref 8.4–10.5)
Chloride: 104 mEq/L (ref 96–112)
Potassium: 3.7 mEq/L (ref 3.5–5.3)
Sodium: 137 mEq/L (ref 135–145)
Total Protein: 6.4 g/dL (ref 6.0–8.3)

## 2011-04-29 LAB — CBC WITH DIFFERENTIAL/PLATELET
BASO%: 0.3 % (ref 0.0–2.0)
Basophils Absolute: 0 10*3/uL (ref 0.0–0.1)
EOS%: 1.2 % (ref 0.0–7.0)
HCT: 31.1 % — ABNORMAL LOW (ref 34.8–46.6)
HGB: 10.5 g/dL — ABNORMAL LOW (ref 11.6–15.9)
MCH: 32.6 pg (ref 25.1–34.0)
MCHC: 33.8 g/dL (ref 31.5–36.0)
MONO#: 0.4 10*3/uL (ref 0.1–0.9)
RDW: 15.1 % — ABNORMAL HIGH (ref 11.2–14.5)
WBC: 3.2 10*3/uL — ABNORMAL LOW (ref 3.9–10.3)
lymph#: 0.5 10*3/uL — ABNORMAL LOW (ref 0.9–3.3)

## 2011-04-29 NOTE — Telephone Encounter (Signed)
Pt given appt schedule for nov/dec while in infusion. Per AJ pt will be tx wk of 11/22 on tues/fri due to holiday.

## 2011-05-02 ENCOUNTER — Encounter (HOSPITAL_BASED_OUTPATIENT_CLINIC_OR_DEPARTMENT_OTHER): Payer: Medicare HMO | Admitting: Internal Medicine

## 2011-05-02 DIAGNOSIS — C9000 Multiple myeloma not having achieved remission: Secondary | ICD-10-CM

## 2011-05-02 DIAGNOSIS — Z5111 Encounter for antineoplastic chemotherapy: Secondary | ICD-10-CM

## 2011-05-04 ENCOUNTER — Other Ambulatory Visit: Payer: Self-pay | Admitting: Internal Medicine

## 2011-05-04 DIAGNOSIS — C9000 Multiple myeloma not having achieved remission: Secondary | ICD-10-CM

## 2011-05-06 ENCOUNTER — Ambulatory Visit (HOSPITAL_BASED_OUTPATIENT_CLINIC_OR_DEPARTMENT_OTHER): Payer: Medicare HMO

## 2011-05-06 ENCOUNTER — Other Ambulatory Visit: Payer: Self-pay | Admitting: Internal Medicine

## 2011-05-06 ENCOUNTER — Other Ambulatory Visit: Payer: Self-pay | Admitting: *Deleted

## 2011-05-06 ENCOUNTER — Telehealth: Payer: Self-pay | Admitting: *Deleted

## 2011-05-06 ENCOUNTER — Other Ambulatory Visit (HOSPITAL_BASED_OUTPATIENT_CLINIC_OR_DEPARTMENT_OTHER): Payer: Medicare HMO

## 2011-05-06 ENCOUNTER — Telehealth: Payer: Self-pay | Admitting: Internal Medicine

## 2011-05-06 VITALS — BP 90/56 | HR 70 | Temp 98.2°F

## 2011-05-06 DIAGNOSIS — Z5112 Encounter for antineoplastic immunotherapy: Secondary | ICD-10-CM

## 2011-05-06 DIAGNOSIS — C9000 Multiple myeloma not having achieved remission: Secondary | ICD-10-CM

## 2011-05-06 DIAGNOSIS — Z452 Encounter for adjustment and management of vascular access device: Secondary | ICD-10-CM

## 2011-05-06 LAB — COMPREHENSIVE METABOLIC PANEL
AST: 14 U/L (ref 0–37)
Alkaline Phosphatase: 57 U/L (ref 39–117)
BUN: 16 mg/dL (ref 6–23)
Calcium: 9.5 mg/dL (ref 8.4–10.5)
Creatinine, Ser: 0.78 mg/dL (ref 0.50–1.10)
Total Bilirubin: 0.5 mg/dL (ref 0.3–1.2)

## 2011-05-06 LAB — CBC WITH DIFFERENTIAL/PLATELET
Basophils Absolute: 0 10*3/uL (ref 0.0–0.1)
EOS%: 0.4 % (ref 0.0–7.0)
HCT: 33 % — ABNORMAL LOW (ref 34.8–46.6)
HGB: 11.3 g/dL — ABNORMAL LOW (ref 11.6–15.9)
LYMPH%: 17.5 % (ref 14.0–49.7)
MCH: 32.5 pg (ref 25.1–34.0)
MCHC: 34.2 g/dL (ref 31.5–36.0)
MCV: 94.8 fL (ref 79.5–101.0)
MONO%: 11.2 % (ref 0.0–14.0)
NEUT%: 70.5 % (ref 38.4–76.8)

## 2011-05-06 MED ORDER — DEXTROSE 5 % IV SOLN: 30.0000 mg/m2 | Freq: Once | INTRAVENOUS | Status: DC

## 2011-05-06 MED ORDER — DEXAMETHASONE SODIUM PHOSPHATE 4 MG/ML IJ SOLN: 40.0000 mg | Freq: Once | INTRAMUSCULAR | Status: DC

## 2011-05-06 MED ORDER — ONDANSETRON 8 MG/50ML IVPB (CHCC)
8.0000 mg | Freq: Once | INTRAVENOUS | Status: AC
  Administered 2011-05-06: 8 mg via INTRAVENOUS

## 2011-05-06 MED ORDER — DEXAMETHASONE SODIUM PHOSPHATE 4 MG/ML IJ SOLN: 20.0000 mg | Freq: Once | INTRAMUSCULAR | Status: DC

## 2011-05-06 MED ORDER — BORTEZOMIB CHEMO IV INJECTION 3.5 MG
1.3000 mg/m2 | Freq: Once | INTRAMUSCULAR | Status: AC
  Administered 2011-05-06: 2.6 mg via INTRAVENOUS
  Filled 2011-05-06: qty 2.6

## 2011-05-06 MED ORDER — DEXAMETHASONE SODIUM PHOSPHATE 10 MG/ML IJ SOLN
10.0000 mg | Freq: Once | INTRAMUSCULAR | Status: AC
  Administered 2011-05-06: 10 mg via INTRAVENOUS

## 2011-05-06 MED ORDER — HEPARIN SOD (PORK) LOCK FLUSH 100 UNIT/ML IV SOLN
500.0000 [IU] | Freq: Once | INTRAVENOUS | Status: AC | PRN
  Administered 2011-05-06: 500 [IU]
  Filled 2011-05-06: qty 5

## 2011-05-06 MED ORDER — SODIUM CHLORIDE 0.9 % IV SOLN: Freq: Once | INTRAVENOUS | Status: DC

## 2011-05-06 MED ORDER — SODIUM CHLORIDE 0.9 % IV SOLN
16.0000 mg | Freq: Once | INTRAVENOUS | Status: DC
  Filled 2011-05-06: qty 8

## 2011-05-06 MED ORDER — SODIUM CHLORIDE 0.9 % IJ SOLN
10.0000 mL | INTRAMUSCULAR | Status: DC | PRN
  Administered 2011-05-06: 10 mL
  Filled 2011-05-06: qty 10

## 2011-05-06 NOTE — Telephone Encounter (Signed)
Verbal order received and read back from Ms. Johnson [name] not to authorize early refill for oxycontin.  Called Ms. Ost to notify her of this.  Expressed that she doesn't know why she only has one pill left.  Suggested uses of pill box and securing her meds.  Reports she has a pill box and she and spouse keep their pills separated in the medicine cabinet.

## 2011-05-06 NOTE — Telephone Encounter (Signed)
[name] called requesting refill on Oxycontin 20mg  which she takes BID.  Reports having only one pill left.  Has taken prescription to [facility] at [address] [phone]) and was told it's too early to refill.  Asked that providers call to notify [facility] it's okay to refill early to receive Oxycontin.  Her opinion is she was not given the correct amt. But [facility] says they counted her pills correctly.  Will notify providers.  Patient can be reached at [phone].

## 2011-05-06 NOTE — Patient Instructions (Signed)
Call if any problems, pt aware of next appt date

## 2011-05-07 ENCOUNTER — Encounter: Payer: Self-pay | Admitting: Certified Registered Nurse Anesthetist

## 2011-05-07 ENCOUNTER — Telehealth: Payer: Self-pay | Admitting: *Deleted

## 2011-05-07 ENCOUNTER — Telehealth: Payer: Self-pay | Admitting: Internal Medicine

## 2011-05-07 NOTE — Telephone Encounter (Signed)
Entered in error

## 2011-05-07 NOTE — Telephone Encounter (Signed)
Cc to Jachelle Fluty 

## 2011-05-08 ENCOUNTER — Other Ambulatory Visit: Payer: Self-pay | Admitting: Internal Medicine

## 2011-05-08 DIAGNOSIS — C9000 Multiple myeloma not having achieved remission: Secondary | ICD-10-CM

## 2011-05-09 ENCOUNTER — Ambulatory Visit (HOSPITAL_BASED_OUTPATIENT_CLINIC_OR_DEPARTMENT_OTHER): Payer: Medicare HMO

## 2011-05-09 ENCOUNTER — Encounter: Payer: Self-pay | Admitting: *Deleted

## 2011-05-09 DIAGNOSIS — Z5112 Encounter for antineoplastic immunotherapy: Secondary | ICD-10-CM

## 2011-05-09 DIAGNOSIS — C9000 Multiple myeloma not having achieved remission: Secondary | ICD-10-CM

## 2011-05-09 MED ORDER — SODIUM CHLORIDE 0.9 % IV SOLN
Freq: Once | INTRAVENOUS | Status: AC
  Administered 2011-05-09: 250 mL via INTRAVENOUS

## 2011-05-09 MED ORDER — HEPARIN SOD (PORK) LOCK FLUSH 100 UNIT/ML IV SOLN
500.0000 [IU] | Freq: Once | INTRAVENOUS | Status: AC | PRN
  Administered 2011-05-09: 500 [IU]
  Filled 2011-05-09: qty 5

## 2011-05-09 MED ORDER — ONDANSETRON 16 MG/50ML IVPB (CHCC)
16.0000 mg | Freq: Once | INTRAVENOUS | Status: AC
  Administered 2011-05-09: 16 mg via INTRAVENOUS

## 2011-05-09 MED ORDER — DOXORUBICIN HCL LIPOSOMAL CHEMO INJECTION 2 MG/ML: 30.0000 mg/m2 | Freq: Once | INTRAVENOUS | Status: DC

## 2011-05-09 MED ORDER — SODIUM CHLORIDE 0.9 % IJ SOLN
10.0000 mL | INTRAMUSCULAR | Status: DC | PRN
  Administered 2011-05-09: 10 mL
  Filled 2011-05-09: qty 10

## 2011-05-09 MED ORDER — BORTEZOMIB CHEMO IV INJECTION 3.5 MG
1.3000 mg/m2 | Freq: Once | INTRAMUSCULAR | Status: AC
  Administered 2011-05-09: 2.6 mg via INTRAVENOUS
  Filled 2011-05-09: qty 2.6

## 2011-05-09 NOTE — Patient Instructions (Signed)
1205 Patient verbalized next appt time and to call MD for any problems.

## 2011-05-10 ENCOUNTER — Ambulatory Visit
Admission: RE | Admit: 2011-05-10 | Discharge: 2011-05-10 | Disposition: A | Discharge status: Home / Self Care | Payer: Medicare HMO | Source: Ambulatory Visit | Attending: Radiation Oncology | Admitting: Radiation Oncology

## 2011-05-10 DIAGNOSIS — C9000 Multiple myeloma not having achieved remission: Secondary | ICD-10-CM

## 2011-05-10 NOTE — Progress Notes (Signed)
Here for follow-up post radiation treatment. to bone. Has improvement in pain.

## 2011-05-13 ENCOUNTER — Other Ambulatory Visit: Payer: Self-pay | Admitting: Internal Medicine

## 2011-05-13 ENCOUNTER — Other Ambulatory Visit: Payer: Self-pay | Admitting: *Deleted

## 2011-05-13 ENCOUNTER — Other Ambulatory Visit (HOSPITAL_BASED_OUTPATIENT_CLINIC_OR_DEPARTMENT_OTHER): Payer: Medicare HMO

## 2011-05-13 ENCOUNTER — Other Ambulatory Visit: Payer: Medicare HMO | Admitting: Lab

## 2011-05-13 DIAGNOSIS — C9000 Multiple myeloma not having achieved remission: Secondary | ICD-10-CM

## 2011-05-13 DIAGNOSIS — C9030 Solitary plasmacytoma not having achieved remission: Secondary | ICD-10-CM

## 2011-05-13 LAB — COMPREHENSIVE METABOLIC PANEL
AST: 21 U/L (ref 0–37)
Albumin: 4 g/dL (ref 3.5–5.2)
Alkaline Phosphatase: 83 U/L (ref 39–117)
BUN: 24 mg/dL — ABNORMAL HIGH (ref 6–23)
Potassium: 3.9 mEq/L (ref 3.5–5.3)
Sodium: 139 mEq/L (ref 135–145)
Total Bilirubin: 0.5 mg/dL (ref 0.3–1.2)
Total Protein: 6.2 g/dL (ref 6.0–8.3)

## 2011-05-13 LAB — CBC WITH DIFFERENTIAL/PLATELET
EOS%: 0.1 % (ref 0.0–7.0)
MCH: 34.1 pg — ABNORMAL HIGH (ref 25.1–34.0)
MCV: 98.1 fL (ref 79.5–101.0)
MONO%: 14 % (ref 0.0–14.0)
RBC: 3.31 10*6/uL — ABNORMAL LOW (ref 3.70–5.45)
RDW: 16.4 % — ABNORMAL HIGH (ref 11.2–14.5)

## 2011-05-13 MED ORDER — PANTOPRAZOLE SODIUM 40 MG PO TBEC: 40.0000 mg | DELAYED_RELEASE_TABLET | Freq: Every day | ORAL | Status: DC

## 2011-05-14 ENCOUNTER — Other Ambulatory Visit: Payer: Self-pay | Admitting: Internal Medicine

## 2011-05-14 DIAGNOSIS — C9000 Multiple myeloma not having achieved remission: Secondary | ICD-10-CM

## 2011-05-15 ENCOUNTER — Telehealth: Payer: Self-pay | Admitting: *Deleted

## 2011-05-16 NOTE — Progress Notes (Signed)
CC:   Lajuana Matte, M.D. Elmon Kirschner, MD [name], PA   DIAGNOSIS:  Multiple myeloma.  NARRATIVE:  [name] returns to the clinic today for followup.  She recently completed an additional course of palliative radiotherapy on 03/21/2011.  She has previously received treatment to the L-spine in 2008 and more recently, she has completed treatment to the C-spine and T- spine at the T2-6 levels.  Her most recent treatment to the C-spine, again, was completed on 03/21/2011.  Ms. Sedlacek' primary complaint today is that of some right-sided ribcage pain.  She states that the pain begins posteriorly in the region of the shoulder blade and wraps around the chest anterolaterally.  This is her dominant area and she denies significant additional areas of significant pain.  The patient is continuing with systemic treatment through Dr. Arbutus Ped.  PHYSICAL EXAMINATION:  Vital Signs:  Weight 183 pounds.  Blood pressure 95/59.  Pulse 68.  Temperature 98.2.  General Appearance:  A well- developed female in no acute distress.  Alert and oriented x3. Cardiovascular:  Regular rate and rhythm.  Respiratory:  Clear to auscultation bilaterally.  GI.  The abdomen is soft, nontender.  Normal bowel sounds.  Extremities:  No edema present.  The patient is nontender in the area of discomfort.  IMPRESSION AND PLAN:  Ms. Milhouse appears to be stable and she presents with no new complaints today.  In reviewing her records, she did have a chest x-ray completed on 03/23/2011 in the setting of right-sided chest pain and this did not show any acute abnormalities.  This pain has been persistent and really is her dominant issue at this time.  I am not aware of any suitable radiation targets and I discussed this with her. This may refer to some radicular pain emanating from areas previously treated.  However, I would like to obtain some additional imaging and I discussed with the patient my plan to order a  bone scan to get one more look at this area and this would also serve as a nice global view of the status of her disease at this time.  We will then proceed accordingly.    ______________________________ Radene Gunning, M.D., Ph.D. JSM/MEDQ  D:  05/14/2011  T:  05/16/2011  Job:  [id number]

## 2011-05-20 ENCOUNTER — Ambulatory Visit: Payer: Medicare HMO

## 2011-05-20 ENCOUNTER — Other Ambulatory Visit: Payer: Medicare HMO | Admitting: Lab

## 2011-05-21 ENCOUNTER — Encounter: Payer: Self-pay | Admitting: Physician Assistant

## 2011-05-21 ENCOUNTER — Other Ambulatory Visit: Payer: Self-pay | Admitting: Internal Medicine

## 2011-05-21 ENCOUNTER — Ambulatory Visit (HOSPITAL_BASED_OUTPATIENT_CLINIC_OR_DEPARTMENT_OTHER): Payer: Medicare HMO | Admitting: Physician Assistant

## 2011-05-21 ENCOUNTER — Ambulatory Visit (HOSPITAL_BASED_OUTPATIENT_CLINIC_OR_DEPARTMENT_OTHER): Payer: Medicare HMO

## 2011-05-21 ENCOUNTER — Other Ambulatory Visit (HOSPITAL_BASED_OUTPATIENT_CLINIC_OR_DEPARTMENT_OTHER): Payer: Medicare HMO | Admitting: Lab

## 2011-05-21 VITALS — BP 104/61 | HR 73 | Temp 97.2°F | Ht 65.0 in | Wt 181.6 lb

## 2011-05-21 DIAGNOSIS — C9000 Multiple myeloma not having achieved remission: Secondary | ICD-10-CM

## 2011-05-21 DIAGNOSIS — Z5112 Encounter for antineoplastic immunotherapy: Secondary | ICD-10-CM

## 2011-05-21 LAB — CBC WITH DIFFERENTIAL/PLATELET
BASO%: 0.3 % (ref 0.0–2.0)
EOS%: 1.2 % (ref 0.0–7.0)
HCT: 31.5 % — ABNORMAL LOW (ref 34.8–46.6)
MCHC: 34 g/dL (ref 31.5–36.0)
MONO#: 0.4 10*3/uL (ref 0.1–0.9)
NEUT%: 77.6 % — ABNORMAL HIGH (ref 38.4–76.8)
RDW: 15.8 % — ABNORMAL HIGH (ref 11.2–14.5)
WBC: 3.4 10*3/uL — ABNORMAL LOW (ref 3.9–10.3)
lymph#: 0.3 10*3/uL — ABNORMAL LOW (ref 0.9–3.3)
nRBC: 0 % (ref 0–0)

## 2011-05-21 LAB — COMPREHENSIVE METABOLIC PANEL
ALT: 11 U/L (ref 0–35)
AST: 16 U/L (ref 0–37)
Calcium: 8.6 mg/dL (ref 8.4–10.5)
Chloride: 107 mEq/L (ref 96–112)
Creatinine, Ser: 0.74 mg/dL (ref 0.50–1.10)
Sodium: 138 mEq/L (ref 135–145)
Total Bilirubin: 0.9 mg/dL (ref 0.3–1.2)
Total Protein: 6.5 g/dL (ref 6.0–8.3)

## 2011-05-21 LAB — LACTATE DEHYDROGENASE: LDH: 302 U/L — ABNORMAL HIGH (ref 94–250)

## 2011-05-21 MED ORDER — SODIUM CHLORIDE 0.9 % IJ SOLN
10.0000 mL | INTRAMUSCULAR | Status: DC | PRN
  Administered 2011-05-21: 10 mL
  Filled 2011-05-21: qty 10

## 2011-05-21 MED ORDER — HEPARIN SOD (PORK) LOCK FLUSH 100 UNIT/ML IV SOLN
500.0000 [IU] | Freq: Once | INTRAVENOUS | Status: AC | PRN
  Administered 2011-05-21: 500 [IU]
  Filled 2011-05-21: qty 5

## 2011-05-21 MED ORDER — SODIUM CHLORIDE 0.9 % IV SOLN
8.0000 mg | Freq: Once | INTRAVENOUS | Status: AC
  Administered 2011-05-21: 8 mg via INTRAVENOUS
  Filled 2011-05-21: qty 4

## 2011-05-21 MED ORDER — SODIUM CHLORIDE 0.9 % IV SOLN
Freq: Once | INTRAVENOUS | Status: AC
  Administered 2011-05-21: 12:00:00 via INTRAVENOUS

## 2011-05-21 MED ORDER — BORTEZOMIB CHEMO IV INJECTION 3.5 MG
1.3000 mg/m2 | Freq: Once | INTRAMUSCULAR | Status: AC
  Administered 2011-05-21: 2.6 mg via INTRAVENOUS
  Filled 2011-05-21: qty 2.6

## 2011-05-21 NOTE — Progress Notes (Signed)
No images are attached to the encounter. No scans are attached to the encounter. No scans are attached to the encounter. [facility] OFFICE PROGRESS NOTE  [name],[name], PA, PA No address on file  DIAGNOSIS: Recurrent multiple myeloma initially diagnosed in October 2008  PRIOR THERAPY: 1. Status post palliative radiotherapy to the lumbar spine between L3 and L5.  The patient received a total dose of 3000 cGy in 10 fractions under the care of Dr. Mitzi Hansen between May 05, 2007 through May 18, 2007. 2. Status post 5 cycles of systemic chemotherapy with Revlimid and low-dose Decadron with good response to this treatment. 3. Status post autologous peripheral blood stem cell transplant at [facility] on Nov 20, 2007 under the care of Dr. Vicente Serene. 4. Status post treatment for disease recurrence with Velcade, Doxil and Decadron.  Last dose given Nov 09, 2009.  Discontinued secondary to intolerance but the patient had a good response to treatment at that time. 5. Status post palliative radiotherapy to the T2-T6 thoracic vertebrae completed 03/21/2011 under the care of Dr. Mitzi Hansen.  CURRENT THERAPY:  :  Systemic chemotherapy with Velcade 1.3 mg per meter squared given on days 1, 4, 8 and 11, and Doxil at 30 mg per meter squared given on day 4 in addition to Decadron status post 2 cycles.  INTERVAL HISTORY: [name] 60 y.o. female returns for a scheduled regular 3 week visit for followup of her recurrent multiple myeloma. She is accompanied by her daughter today. She reports that when she receives both with Velcade and Doxil she usually has a lot of nausea and malaise. She reports intermittent headaches not associated with nausea or vomiting or vision changes. She states that she did have some mild blurred vision but recently had the prescription to her eye glasses changed. She continues to have some intermittent pains affecting her right side. He complains of her mouth being  dry despite using the biopsy in products and her eyes are dry as well. She is using drops to help the eye dryness. She was placed on Cymbalta by her primary care physician and this seems to be helping some with the pain, as well as slightly with the peripheral neuropathy symptoms affecting her hands and feet.   MEDICAL HISTORY: Past Medical History  Diagnosis Date  . Cancer multiple myeloma  . Thyroid disease Hypothyroidism  . Hypercholesterolemia   . Compression fracture 04/08/2007    pathologic compression fracture  . Hypothyroidism   . FHx: chemotherapy     s/p 5 cycle revlimid/low dose decadron,s/p velcade,doxil,decadron,  . Hx of radiation therapy 05/05/07-05/18/07,& 03/05/11-03/21/11-    l3&l5 in 2008, t2-t6 03/2011  . GERD (gastroesophageal reflux disease)   . Insomnia     associated with steroids  . Nausea   . Constipation     takes oxycontin,vicodin    ALLERGIES:   has no known allergies.  MEDICATIONS:  Current Outpatient Prescriptions  Medication Sig Dispense Refill  . cholecalciferol (VITAMIN D) 1000 UNITS tablet Take 4,000 Units by mouth daily.        . DULoxetine (CYMBALTA) 30 MG capsule Take 30 mg by mouth daily.        . eszopiclone (LUNESTA) 1 MG TABS Take 3 mg by mouth at bedtime. Take immediately before bedtime       . fexofenadine-pseudoephedrine (ALLEGRA-D 24) 180-240 MG per 24 hr tablet Take 1 tablet by mouth daily as needed.        . gabapentin (NEURONTIN) 100  MG capsule Take 100 mg by mouth 4 (four) times daily.       Marland Kitchen HYDROcodone-acetaminophen (VICODIN ES) 7.5-750 MG per tablet Take 1 tablet by mouth every 6 (six) hours as needed.        Marland Kitchen levothyroxine (SYNTHROID, LEVOTHROID) 100 MCG tablet Take 100 mcg by mouth daily. 1 tab x 2 days a week, 1/2 tab all others      . lidocaine-prilocaine (EMLA) cream Apply topically as needed.        . magnesium chloride (SLOW-MAG) 64 MG TBEC Take 1 tablet by mouth daily.        . NON FORMULARY Apply topically 1 day or 1  dose. Alra Deodorant       . oxyCODONE (OXYCONTIN) 20 MG 12 hr tablet Take 20 mg by mouth every 12 (twelve) hours. Pt now primarily taking 1 tab at night      . pantoprazole (PROTONIX) 40 MG tablet TAKE 1 TABLET BY MOUTH EVERY DAY  30 tablet  1  . prochlorperazine (COMPAZINE) 10 MG tablet Take 10 mg by mouth every 6 (six) hours as needed.       . Alum & Mag Hydroxide-Simeth (MAGIC MOUTHWASH) SOLN Take 5 mLs by mouth as needed.        Marland Kitchen emollient (BIAFINE) cream Apply topically as needed.        . ergocalciferol (DRISDOL) 8000 UNIT/ML drops Take 4,000 Units by mouth daily.        . hydrochlorothiazide (HYDRODIURIL) 25 MG tablet Take 25 mg by mouth daily.       Marland Kitchen lidocaine (XYLOCAINE) 2 % solution Take by mouth 3 (three) times daily.       . Wound Cleansers (RADIAPLEX EX) Apply topically daily.          SURGICAL HISTORY:  Past Surgical History  Procedure Date  . Cholecystectomy   . Knee surgery   . Knee surgery     REVIEW OF SYSTEMS:  A comprehensive review of systems was negative except for: Ears, nose, mouth, throat, and face: positive for Dry oral mucosa as well as dry eyes. Musculoskeletal: positive for Intermittent pain involving the right side extending towards the back. Neurological: positive for headaches, paresthesia and Peripheral neuropathy symptoms affecting both the hands and feet.   PHYSICAL EXAMINATION: General appearance: alert, cooperative, appears stated age and no distress Head: Normocephalic, without obvious abnormality, atraumatic Neck: no adenopathy, no carotid bruit, no JVD, supple, symmetrical, trachea midline and thyroid not enlarged, symmetric, no tenderness/mass/nodules Lymph nodes: Cervical, supraclavicular, and axillary nodes normal. Resp: clear to auscultation bilaterally Cardio: regular rate and rhythm, S1, S2 normal, no murmur, click, rub or gallop GI: soft, non-tender; bowel sounds normal; no masses,  no organomegaly Extremities: extremities normal,  atraumatic, no cyanosis or edema  ECOG PERFORMANCE STATUS: 1 - Symptomatic but completely ambulatory  Blood pressure 104/61, pulse 73, temperature 97.2 F (36.2 C), temperature source Oral, height 5\' 5"  (1.651 m), weight 82.373 kg (181 lb 9.6 oz).  LABORATORY DATA: Lab Results  Component Value Date   WBC 3.4* 05/21/2011   HGB 10.7* 05/21/2011   HCT 31.5* 05/21/2011   MCV 96.3 05/21/2011   PLT 167 05/21/2011      Chemistry      Component Value Date/Time   NA 139 05/13/2011 0926   K 3.9 05/13/2011 0926   CL 108 05/13/2011 0926   CO2 22 05/13/2011 0926   BUN 24* 05/13/2011 0926   CREATININE 0.87 05/13/2011 1610  Component Value Date/Time   CALCIUM 9.4 05/13/2011 0926   ALKPHOS 83 05/13/2011 0926   AST 21 05/13/2011 0926   ALT 13 05/13/2011 0926   BILITOT 0.5 05/13/2011 0926       RADIOGRAPHIC STUDIES:  No results found.  ASSESSMENT/PLAN: Today very pleasant 60 year old after American female with progressive multiple myeloma. She is now being treated again with Velcade Doxil and Decadron status post 2 cycles. Patient was discussed with Dr. Gwenyth Bouillon. Her protein studies were last done in September. She will follow up with Dr. Gwenyth Bouillon in 4 weeks with labs drawn one week prior to that appointment consisting of CBC differential CMET LDH quantitative immunoglobulin beta-2 microglobulin and serum Iight chains. She'll proceed with cycle #3 of her chemotherapy with Velcade Doxil and Decadron and will return in 3 weeks to proceed with cycle #4.     Conni Slipper, [name]     All questions were answered. The patient knows to call the clinic with any problems, questions or concerns. We can certainly see the patient much sooner if necessary.

## 2011-05-21 NOTE — Patient Instructions (Signed)
Instructed patient to call with any problems.  Patient aware of next appointment 

## 2011-05-22 ENCOUNTER — Ambulatory Visit (HOSPITAL_COMMUNITY)
Admission: RE | Admit: 2011-05-22 | Discharge: 2011-05-22 | Disposition: A | Discharge status: Home / Self Care | Payer: Medicare HMO | Source: Ambulatory Visit | Attending: Radiation Oncology | Admitting: Radiation Oncology

## 2011-05-22 ENCOUNTER — Encounter (HOSPITAL_COMMUNITY)
Admission: RE | Admit: 2011-05-22 | Discharge: 2011-05-22 | Disposition: A | Discharge status: Home / Self Care | Payer: Medicare HMO | Source: Ambulatory Visit | Attending: Radiation Oncology | Admitting: Radiation Oncology

## 2011-05-22 ENCOUNTER — Other Ambulatory Visit: Payer: Self-pay | Admitting: Radiation Oncology

## 2011-05-22 DIAGNOSIS — M545 Low back pain, unspecified: Secondary | ICD-10-CM | POA: Insufficient documentation

## 2011-05-22 DIAGNOSIS — X58XXXA Exposure to other specified factors, initial encounter: Secondary | ICD-10-CM | POA: Insufficient documentation

## 2011-05-22 DIAGNOSIS — C9000 Multiple myeloma not having achieved remission: Secondary | ICD-10-CM | POA: Insufficient documentation

## 2011-05-22 DIAGNOSIS — R0789 Other chest pain: Secondary | ICD-10-CM | POA: Insufficient documentation

## 2011-05-22 DIAGNOSIS — G9589 Other specified diseases of spinal cord: Secondary | ICD-10-CM | POA: Insufficient documentation

## 2011-05-22 DIAGNOSIS — Z09 Encounter for follow-up examination after completed treatment for conditions other than malignant neoplasm: Secondary | ICD-10-CM

## 2011-05-22 DIAGNOSIS — S32009A Unspecified fracture of unspecified lumbar vertebra, initial encounter for closed fracture: Secondary | ICD-10-CM | POA: Insufficient documentation

## 2011-05-22 DIAGNOSIS — M549 Dorsalgia, unspecified: Secondary | ICD-10-CM | POA: Insufficient documentation

## 2011-05-22 MED ORDER — TECHNETIUM TC 99M MEDRONATE IV KIT
25.0000 | PACK | Freq: Once | INTRAVENOUS | Status: AC | PRN
  Administered 2011-05-22: 25 via INTRAVENOUS

## 2011-05-24 ENCOUNTER — Other Ambulatory Visit: Payer: Self-pay | Admitting: Physician Assistant

## 2011-05-24 ENCOUNTER — Ambulatory Visit (HOSPITAL_BASED_OUTPATIENT_CLINIC_OR_DEPARTMENT_OTHER): Payer: Medicare HMO

## 2011-05-24 VITALS — BP 101/61 | HR 66 | Temp 97.3°F

## 2011-05-24 DIAGNOSIS — C9000 Multiple myeloma not having achieved remission: Secondary | ICD-10-CM

## 2011-05-24 DIAGNOSIS — Z5112 Encounter for antineoplastic immunotherapy: Secondary | ICD-10-CM

## 2011-05-24 MED ORDER — ONDANSETRON 16 MG/50ML IVPB (CHCC)
16.0000 mg | Freq: Once | INTRAVENOUS | Status: AC
  Administered 2011-05-24: 16 mg via INTRAVENOUS

## 2011-05-24 MED ORDER — DEXAMETHASONE SODIUM PHOSPHATE 10 MG/ML IJ SOLN
20.0000 mg | Freq: Once | INTRAMUSCULAR | Status: AC
  Administered 2011-05-24: 20 mg via INTRAVENOUS

## 2011-05-24 MED ORDER — SODIUM CHLORIDE 0.9 % IV SOLN: Freq: Once | INTRAVENOUS | Status: DC

## 2011-05-24 MED ORDER — BORTEZOMIB CHEMO IV INJECTION 3.5 MG
1.3000 mg/m2 | Freq: Once | INTRAMUSCULAR | Status: AC
  Administered 2011-05-24: 2.6 mg via INTRAVENOUS
  Filled 2011-05-24: qty 2.6

## 2011-05-24 MED ORDER — DOXORUBICIN HCL LIPOSOMAL CHEMO INJECTION 2 MG/ML
30.0000 mg/m2 | Freq: Once | INTRAVENOUS | Status: AC
  Administered 2011-05-24: 60 mg via INTRAVENOUS
  Filled 2011-05-24: qty 30

## 2011-05-24 MED ORDER — HEPARIN SOD (PORK) LOCK FLUSH 100 UNIT/ML IV SOLN
500.0000 [IU] | Freq: Once | INTRAVENOUS | Status: AC | PRN
  Administered 2011-05-24: 500 [IU]
  Filled 2011-05-24: qty 5

## 2011-05-24 MED ORDER — SODIUM CHLORIDE 0.9 % IJ SOLN
10.0000 mL | INTRAMUSCULAR | Status: DC | PRN
  Administered 2011-05-24: 10 mL
  Filled 2011-05-24: qty 10

## 2011-05-24 NOTE — Patient Instructions (Signed)
Pt verbalized understanding of next appt date/time.   

## 2011-05-27 ENCOUNTER — Other Ambulatory Visit: Payer: Self-pay | Admitting: Internal Medicine

## 2011-05-27 ENCOUNTER — Other Ambulatory Visit (HOSPITAL_BASED_OUTPATIENT_CLINIC_OR_DEPARTMENT_OTHER): Payer: Medicare HMO | Admitting: Lab

## 2011-05-27 ENCOUNTER — Ambulatory Visit (HOSPITAL_BASED_OUTPATIENT_CLINIC_OR_DEPARTMENT_OTHER): Payer: Medicare HMO

## 2011-05-27 VITALS — BP 103/58 | HR 66 | Temp 98.1°F

## 2011-05-27 DIAGNOSIS — C9000 Multiple myeloma not having achieved remission: Secondary | ICD-10-CM

## 2011-05-27 DIAGNOSIS — Z5112 Encounter for antineoplastic immunotherapy: Secondary | ICD-10-CM

## 2011-05-27 LAB — CBC WITH DIFFERENTIAL/PLATELET
BASO%: 1.5 % (ref 0.0–2.0)
MCHC: 34.2 g/dL (ref 31.5–36.0)
MONO#: 0.8 10*3/uL (ref 0.1–0.9)
RBC: 3.72 10*6/uL (ref 3.70–5.45)
RDW: 15.8 % — ABNORMAL HIGH (ref 11.2–14.5)
WBC: 2 10*3/uL — ABNORMAL LOW (ref 3.9–10.3)
lymph#: 0.3 10*3/uL — ABNORMAL LOW (ref 0.9–3.3)
nRBC: 2 % — ABNORMAL HIGH (ref 0–0)

## 2011-05-27 LAB — COMPREHENSIVE METABOLIC PANEL
Albumin: 4 g/dL (ref 3.5–5.2)
Alkaline Phosphatase: 51 U/L (ref 39–117)
BUN: 11 mg/dL (ref 6–23)
Calcium: 9.3 mg/dL (ref 8.4–10.5)
Chloride: 106 mEq/L (ref 96–112)
Creatinine, Ser: 0.81 mg/dL (ref 0.50–1.10)
Glucose, Bld: 100 mg/dL — ABNORMAL HIGH (ref 70–99)
Potassium: 4 mEq/L (ref 3.5–5.3)

## 2011-05-27 MED ORDER — SODIUM CHLORIDE 0.9 % IJ SOLN
10.0000 mL | INTRAMUSCULAR | Status: DC | PRN
  Administered 2011-05-27: 10 mL
  Filled 2011-05-27: qty 10

## 2011-05-27 MED ORDER — SODIUM CHLORIDE 0.9 % IV SOLN
8.0000 mg | Freq: Once | INTRAVENOUS | Status: DC
  Administered 2011-05-27: 8 mg via INTRAVENOUS

## 2011-05-27 MED ORDER — ONDANSETRON 8 MG/50ML IVPB (CHCC): 8.0000 mg | Freq: Once | INTRAVENOUS | Status: DC

## 2011-05-27 MED ORDER — HEPARIN SOD (PORK) LOCK FLUSH 100 UNIT/ML IV SOLN
500.0000 [IU] | Freq: Once | INTRAVENOUS | Status: AC | PRN
  Administered 2011-05-27: 500 [IU]
  Filled 2011-05-27: qty 5

## 2011-05-27 MED ORDER — SODIUM CHLORIDE 0.9 % IV SOLN
Freq: Once | INTRAVENOUS | Status: AC
  Administered 2011-05-27: 10:00:00 via INTRAVENOUS

## 2011-05-27 MED ORDER — BORTEZOMIB CHEMO IV INJECTION 3.5 MG
1.3000 mg/m2 | Freq: Once | INTRAMUSCULAR | Status: AC
  Administered 2011-05-27: 2.6 mg via INTRAVENOUS
  Filled 2011-05-27: qty 2.6

## 2011-05-27 NOTE — Progress Notes (Signed)
05/27/11 1000- Okay to treat today with WBC 2.0 ANC 0.9 per Dr Arbutus Ped.  Also pt having more numbness and tingling in feet and hands.  Pt states that her left foot went completely numb and she hit her foot on a table at home.  There is a small blister on fifth tarsal of left foot.  Dr Arbutus Ped made aware and okay to proceed with treatment today and this is a side effect of chemo.  Pt is taking Gabapentin as prescribed.

## 2011-05-27 NOTE — Patient Instructions (Signed)
1100-Pt discharged ambulatory with next appointment confirmed.  Pt aware to call with any questions or concerns.

## 2011-05-30 ENCOUNTER — Ambulatory Visit (HOSPITAL_BASED_OUTPATIENT_CLINIC_OR_DEPARTMENT_OTHER): Payer: Medicare HMO

## 2011-05-30 VITALS — BP 109/62 | HR 74 | Temp 98.2°F

## 2011-05-30 DIAGNOSIS — C9000 Multiple myeloma not having achieved remission: Secondary | ICD-10-CM

## 2011-05-30 DIAGNOSIS — Z5112 Encounter for antineoplastic immunotherapy: Secondary | ICD-10-CM

## 2011-05-30 MED ORDER — HEPARIN SOD (PORK) LOCK FLUSH 100 UNIT/ML IV SOLN
500.0000 [IU] | Freq: Once | INTRAVENOUS | Status: AC | PRN
  Administered 2011-05-30: 500 [IU]
  Filled 2011-05-30: qty 5

## 2011-05-30 MED ORDER — ONDANSETRON 8 MG/50ML IVPB (CHCC)
8.0000 mg | Freq: Once | INTRAVENOUS | Status: AC
  Administered 2011-05-30: 8 mg via INTRAVENOUS

## 2011-05-30 MED ORDER — SODIUM CHLORIDE 0.9 % IV SOLN
Freq: Once | INTRAVENOUS | Status: AC
  Administered 2011-05-30: 10:00:00 via INTRAVENOUS

## 2011-05-30 MED ORDER — SODIUM CHLORIDE 0.9 % IV SOLN: 8.0000 mg | Freq: Once | INTRAVENOUS | Status: DC

## 2011-05-30 MED ORDER — SODIUM CHLORIDE 0.9 % IJ SOLN
10.0000 mL | INTRAMUSCULAR | Status: DC | PRN
  Administered 2011-05-30: 10 mL
  Filled 2011-05-30: qty 10

## 2011-05-30 MED ORDER — BORTEZOMIB CHEMO IV INJECTION 3.5 MG
1.3000 mg/m2 | Freq: Once | INTRAMUSCULAR | Status: AC
  Administered 2011-05-30: 2.6 mg via INTRAVENOUS
  Filled 2011-05-30: qty 2.6

## 2011-06-07 ENCOUNTER — Other Ambulatory Visit: Payer: Self-pay | Admitting: Radiation Oncology

## 2011-06-10 MED ORDER — HYDROCODONE-ACETAMINOPHEN 7.5-750 MG PO TABS: 1.0000 | ORAL_TABLET | Freq: Four times a day (QID) | ORAL | Status: DC | PRN

## 2011-06-11 ENCOUNTER — Ambulatory Visit (HOSPITAL_BASED_OUTPATIENT_CLINIC_OR_DEPARTMENT_OTHER): Payer: Medicare HMO

## 2011-06-11 ENCOUNTER — Other Ambulatory Visit: Payer: Self-pay | Admitting: Internal Medicine

## 2011-06-11 ENCOUNTER — Other Ambulatory Visit (HOSPITAL_BASED_OUTPATIENT_CLINIC_OR_DEPARTMENT_OTHER): Payer: Medicare HMO | Admitting: Lab

## 2011-06-11 ENCOUNTER — Other Ambulatory Visit: Payer: Medicare HMO | Admitting: Lab

## 2011-06-11 ENCOUNTER — Other Ambulatory Visit: Payer: Self-pay | Admitting: Certified Registered Nurse Anesthetist

## 2011-06-11 VITALS — BP 100/64 | HR 85 | Temp 98.2°F

## 2011-06-11 DIAGNOSIS — C9000 Multiple myeloma not having achieved remission: Secondary | ICD-10-CM

## 2011-06-11 DIAGNOSIS — Z5112 Encounter for antineoplastic immunotherapy: Secondary | ICD-10-CM

## 2011-06-11 LAB — CBC WITH DIFFERENTIAL/PLATELET
BASO%: 0.8 % (ref 0.0–2.0)
MCHC: 34.8 g/dL (ref 31.5–36.0)
MONO#: 0.3 10*3/uL (ref 0.1–0.9)
RBC: 3.37 10*6/uL — ABNORMAL LOW (ref 3.70–5.45)
WBC: 4 10*3/uL (ref 3.9–10.3)
lymph#: 0.2 10*3/uL — ABNORMAL LOW (ref 0.9–3.3)

## 2011-06-11 MED ORDER — HEPARIN SOD (PORK) LOCK FLUSH 100 UNIT/ML IV SOLN
500.0000 [IU] | Freq: Once | INTRAVENOUS | Status: AC | PRN
  Administered 2011-06-11: 500 [IU]
  Filled 2011-06-11: qty 5

## 2011-06-11 MED ORDER — SODIUM CHLORIDE 0.9 % IJ SOLN
10.0000 mL | INTRAMUSCULAR | Status: DC | PRN
  Administered 2011-06-11: 10 mL
  Filled 2011-06-11: qty 10

## 2011-06-11 MED ORDER — SODIUM CHLORIDE 0.9 % IV SOLN
Freq: Once | INTRAVENOUS | Status: AC
  Administered 2011-06-11: 11:00:00 via INTRAVENOUS

## 2011-06-11 MED ORDER — SODIUM CHLORIDE 0.9 % IV SOLN
8.0000 mg | Freq: Once | INTRAVENOUS | Status: AC
  Administered 2011-06-11: 8 mg via INTRAVENOUS
  Filled 2011-06-11: qty 4

## 2011-06-11 MED ORDER — BORTEZOMIB CHEMO IV INJECTION 3.5 MG
1.3000 mg/m2 | Freq: Once | INTRAMUSCULAR | Status: AC
  Administered 2011-06-11: 2.6 mg via INTRAVENOUS
  Filled 2011-06-11: qty 2.6

## 2011-06-11 NOTE — Patient Instructions (Signed)
Pt discharged to home with family.  Pt to call with questions/concerns. shk

## 2011-06-12 ENCOUNTER — Other Ambulatory Visit: Payer: Medicare HMO | Admitting: Lab

## 2011-06-12 ENCOUNTER — Ambulatory Visit: Payer: Medicare HMO

## 2011-06-13 LAB — SPEP & IFE WITH QIG
Alpha-1-Globulin: 5.5 % — ABNORMAL HIGH (ref 2.9–4.9)
Gamma Globulin: 12.2 % (ref 11.1–18.8)
IgM, Serum: 18 mg/dL — ABNORMAL LOW (ref 52–322)
Total Protein, Serum Electrophoresis: 6.1 g/dL (ref 6.0–8.3)

## 2011-06-13 LAB — KAPPA/LAMBDA LIGHT CHAINS: Kappa:Lambda Ratio: 0.32 (ref 0.26–1.65)

## 2011-06-13 LAB — COMPREHENSIVE METABOLIC PANEL
AST: 19 U/L (ref 0–37)
Albumin: 3.9 g/dL (ref 3.5–5.2)
Alkaline Phosphatase: 59 U/L (ref 39–117)
Potassium: 4.2 mEq/L (ref 3.5–5.3)
Sodium: 137 mEq/L (ref 135–145)
Total Protein: 6.1 g/dL (ref 6.0–8.3)

## 2011-06-14 ENCOUNTER — Ambulatory Visit (HOSPITAL_BASED_OUTPATIENT_CLINIC_OR_DEPARTMENT_OTHER): Payer: Medicare HMO

## 2011-06-14 VITALS — BP 106/69 | HR 83 | Temp 98.2°F

## 2011-06-14 DIAGNOSIS — Z5112 Encounter for antineoplastic immunotherapy: Secondary | ICD-10-CM

## 2011-06-14 DIAGNOSIS — C9000 Multiple myeloma not having achieved remission: Secondary | ICD-10-CM

## 2011-06-14 MED ORDER — SODIUM CHLORIDE 0.9 % IV SOLN: Freq: Once | INTRAVENOUS | Status: DC

## 2011-06-14 MED ORDER — DOXORUBICIN HCL LIPOSOMAL CHEMO INJECTION 2 MG/ML
30.0000 mg/m2 | Freq: Once | INTRAVENOUS | Status: AC
  Administered 2011-06-14: 60 mg via INTRAVENOUS
  Filled 2011-06-14: qty 30

## 2011-06-14 MED ORDER — SODIUM CHLORIDE 0.9 % IJ SOLN
10.0000 mL | INTRAMUSCULAR | Status: DC | PRN
Start: 2011-06-14 — End: 2011-06-14
  Administered 2011-06-14: 10 mL
  Filled 2011-06-14: qty 10

## 2011-06-14 MED ORDER — BORTEZOMIB CHEMO IV INJECTION 3.5 MG
1.3000 mg/m2 | Freq: Once | INTRAMUSCULAR | Status: AC
  Administered 2011-06-14: 2.6 mg via INTRAVENOUS
  Filled 2011-06-14: qty 2.6

## 2011-06-14 MED ORDER — ONDANSETRON 16 MG/50ML IVPB (CHCC)
16.0000 mg | Freq: Once | INTRAVENOUS | Status: AC
  Administered 2011-06-14: 16 mg via INTRAVENOUS

## 2011-06-14 MED ORDER — HEPARIN SOD (PORK) LOCK FLUSH 100 UNIT/ML IV SOLN
500.0000 [IU] | Freq: Once | INTRAVENOUS | Status: AC | PRN
  Administered 2011-06-14: 500 [IU]
  Filled 2011-06-14: qty 5

## 2011-06-14 NOTE — Patient Instructions (Signed)
Patient discharged home with son; patient aware of next appointment; medications at home for nausea and pain prn.

## 2011-06-18 ENCOUNTER — Ambulatory Visit (HOSPITAL_BASED_OUTPATIENT_CLINIC_OR_DEPARTMENT_OTHER): Payer: Medicare HMO

## 2011-06-18 ENCOUNTER — Other Ambulatory Visit: Payer: Self-pay | Admitting: Certified Registered Nurse Anesthetist

## 2011-06-18 ENCOUNTER — Other Ambulatory Visit: Payer: Self-pay | Admitting: Internal Medicine

## 2011-06-18 ENCOUNTER — Telehealth: Payer: Self-pay | Admitting: Internal Medicine

## 2011-06-18 ENCOUNTER — Ambulatory Visit (HOSPITAL_BASED_OUTPATIENT_CLINIC_OR_DEPARTMENT_OTHER): Payer: Medicare HMO | Admitting: Internal Medicine

## 2011-06-18 DIAGNOSIS — C9000 Multiple myeloma not having achieved remission: Secondary | ICD-10-CM

## 2011-06-18 DIAGNOSIS — C9001 Multiple myeloma in remission: Secondary | ICD-10-CM

## 2011-06-18 DIAGNOSIS — Z5112 Encounter for antineoplastic immunotherapy: Secondary | ICD-10-CM

## 2011-06-18 MED ORDER — BORTEZOMIB CHEMO IV INJECTION 3.5 MG
1.3000 mg/m2 | Freq: Once | INTRAMUSCULAR | Status: AC
  Administered 2011-06-18: 2.6 mg via INTRAVENOUS
  Filled 2011-06-18: qty 2.6

## 2011-06-18 MED ORDER — SODIUM CHLORIDE 0.9 % IV SOLN
8.0000 mg | Freq: Once | INTRAVENOUS | Status: DC
  Administered 2011-06-18: 8 mg via INTRAVENOUS

## 2011-06-18 MED ORDER — SODIUM CHLORIDE 0.9 % IV SOLN: Freq: Once | INTRAVENOUS | Status: DC

## 2011-06-18 MED ORDER — SODIUM CHLORIDE 0.9 % IJ SOLN
10.0000 mL | INTRAMUSCULAR | Status: DC | PRN
  Filled 2011-06-18: qty 10

## 2011-06-18 MED ORDER — ONDANSETRON 8 MG/50ML IVPB (CHCC): 8.0000 mg | Freq: Once | INTRAVENOUS | Status: DC

## 2011-06-18 NOTE — Progress Notes (Signed)
[facility] OFFICE PROGRESS NOTE  [name],[name], PA, PA No address on file  DIAGNOSIS: Recurrent multiple myeloma initially diagnosed in October 2008.   PRIOR THERAPY:  1. Status post palliative radiotherapy to the lumbar spine between L3 and L5. The patient received a total dose of 3000 cGy in 10 fractions under the care of Dr. Mitzi Hansen between May 05, 2007 through May 18, 2007. 2. Status post 5 cycles of systemic chemotherapy with Revlimid and low-dose Decadron with good response to this treatment. 3. Status post autologous peripheral blood stem cell transplant at [facility] on Nov 20, 2007 under the care of Dr. Vicente Serene. 4. Status post treatment for disease recurrence with Velcade, Doxil and Decadron. Last dose given Nov 09, 2009. Discontinued secondary to intolerance but the patient had a good response to treatment at that time. 5. Status post palliative radiotherapy to the T2-T6 thoracic vertebrae completed 03/21/2011 under the care of Dr. Mitzi Hansen. 6.  CURRENT THERAPY: : Systemic chemotherapy with Velcade 1.3 mg per meter squared given on days 1, 4, 8 and 11, and Doxil at 30 mg per meter squared given on day 4 in addition to Decadron status post 3 cycles.   INTERVAL HISTORY: [name] 60 y.o. female returns to the clinic today for followup visit accompanied by her daughter. The patient is feeling fine today except for occasional low back pain. She tolerated velocity cycles of systemic chemotherapy with Velcade, Doxil and Decadron well. She has only occasional nausea. She denied having any significant weight loss or night sweats. The patient denied having any significant chest pain or shortness of breath. She has repeat CBC, compensatory panel, LDH and myeloma panel performed recently and she is here for evaluation and discussion of her lab results.  MEDICAL HISTORY: Past Medical History  Diagnosis Date  . Cancer multiple myeloma  . Thyroid disease  Hypothyroidism  . Hypercholesterolemia   . Compression fracture 04/08/2007    pathologic compression fracture  . Hypothyroidism   . FHx: chemotherapy     s/p 5 cycle revlimid/low dose decadron,s/p velcade,doxil,decadron,  . Hx of radiation therapy 05/05/07-05/18/07,& 03/05/11-03/21/11-    l3&l5 in 2008, t2-t6 03/2011  . GERD (gastroesophageal reflux disease)   . Insomnia     associated with steroids  . Nausea   . Constipation     takes oxycontin,vicodin    ALLERGIES:   has no known allergies.  MEDICATIONS:  Current Outpatient Prescriptions  Medication Sig Dispense Refill  . Alum & Mag Hydroxide-Simeth (MAGIC MOUTHWASH) SOLN Take 5 mLs by mouth as needed.        . cholecalciferol (VITAMIN D) 1000 UNITS tablet Take 4,000 Units by mouth daily.        . DULoxetine (CYMBALTA) 30 MG capsule Take 30 mg by mouth daily.        Marland Kitchen emollient (BIAFINE) cream Apply topically as needed.        . ergocalciferol (DRISDOL) 8000 UNIT/ML drops Take 4,000 Units by mouth daily.        . eszopiclone (LUNESTA) 1 MG TABS Take 3 mg by mouth at bedtime. Take immediately before bedtime       . fexofenadine-pseudoephedrine (ALLEGRA-D 24) 180-240 MG per 24 hr tablet Take 1 tablet by mouth daily as needed.        . gabapentin (NEURONTIN) 100 MG capsule Take 100 mg by mouth 4 (four) times daily.       . hydrochlorothiazide (HYDRODIURIL) 25 MG tablet Take 25 mg by mouth  daily.       Marland Kitchen HYDROcodone-acetaminophen (VICODIN ES) 7.5-750 MG per tablet Take 1 tablet by mouth every 6 (six) hours as needed.  60 tablet  0  . levothyroxine (SYNTHROID, LEVOTHROID) 100 MCG tablet Take 100 mcg by mouth daily. 1 tab x 2 days a week, 1/2 tab all others      . lidocaine (XYLOCAINE) 2 % solution Take by mouth 3 (three) times daily.       Marland Kitchen lidocaine-prilocaine (EMLA) cream Apply topically as needed.        . magnesium chloride (SLOW-MAG) 64 MG TBEC Take 1 tablet by mouth daily.        . NON FORMULARY Apply topically 1 day or 1 dose.  [employer]ra Deodorant       . oxyCODONE (OXYCONTIN) 20 MG 12 hr tablet Take 20 mg by mouth every 12 (twelve) hours. Pt now primarily taking 1 tab at night      . pantoprazole (PROTONIX) 40 MG tablet TAKE 1 TABLET BY MOUTH EVERY DAY  30 tablet  1  . prochlorperazine (COMPAZINE) 10 MG tablet Take 10 mg by mouth every 6 (six) hours as needed.       . Wound Cleansers (RADIAPLEX EX) Apply topically daily.          SURGICAL HISTORY:  Past Surgical History  Procedure Date  . Cholecystectomy   . Knee surgery   . Knee surgery     REVIEW OF SYSTEMS:  A comprehensive review of systems was negative except for: Constitutional: positive for fatigue Musculoskeletal: positive for back pain   PHYSICAL EXAMINATION: General appearance: alert, cooperative and no distress Head: Normocephalic, without obvious abnormality, atraumatic Neck: no adenopathy Lymph nodes: Cervical, supraclavicular, and axillary nodes normal. Resp: clear to auscultation bilaterally Back: tenderness to palpation in the lumbar spine Cardio: regular rate and rhythm, S1, S2 normal, no murmur, click, rub or gallop GI: soft, non-tender; bowel sounds normal; no masses,  no organomegaly Extremities: extremities normal, atraumatic, no cyanosis or edema Neurologic: Alert and oriented X 3, normal strength and tone. Normal symmetric reflexes. Normal coordination and gait  ECOG PERFORMANCE STATUS: 1 - Symptomatic but completely ambulatory  There were no vitals taken for this visit.  LABORATORY DATA: Lab Results  Component Value Date   WBC 4.0 06/11/2011   HGB 11.5* 06/11/2011   HCT 32.9* 06/11/2011   MCV 97.6 06/11/2011   PLT 142* 06/11/2011      Chemistry      Component Value Date/Time   NA 137 06/11/2011 1041   NA 137 06/11/2011 1041   NA 137 06/11/2011 1041   NA 137 06/11/2011 1041   K 4.2 06/11/2011 1041   K 4.2 06/11/2011 1041   K 4.2 06/11/2011 1041   K 4.2 06/11/2011 1041   CL 108 06/11/2011 1041   CL 108 06/11/2011 1041    CL 108 06/11/2011 1041   CL 108 06/11/2011 1041   CO2 21 06/11/2011 1041   CO2 21 06/11/2011 1041   CO2 21 06/11/2011 1041   CO2 21 06/11/2011 1041   BUN 10 06/11/2011 1041   BUN 10 06/11/2011 1041   BUN 10 06/11/2011 1041   BUN 10 06/11/2011 1041   CREATININE 0.65 06/11/2011 1041   CREATININE 0.65 06/11/2011 1041   CREATININE 0.65 06/11/2011 1041   CREATININE 0.65 06/11/2011 1041      Component Value Date/Time   CALCIUM 9.2 06/11/2011 1041   CALCIUM 9.2 06/11/2011 1041   CALCIUM 9.2 06/11/2011 1041  CALCIUM 9.2 06/11/2011 1041   ALKPHOS 59 06/11/2011 1041   ALKPHOS 59 06/11/2011 1041   ALKPHOS 59 06/11/2011 1041   ALKPHOS 59 06/11/2011 1041   AST 19 06/11/2011 1041   AST 19 06/11/2011 1041   AST 19 06/11/2011 1041   AST 19 06/11/2011 1041   ALT 12 06/11/2011 1041   ALT 12 06/11/2011 1041   ALT 12 06/11/2011 1041   ALT 12 06/11/2011 1041   BILITOT 1.0 06/11/2011 1041   BILITOT 1.0 06/11/2011 1041   BILITOT 1.0 06/11/2011 1041   BILITOT 1.0 06/11/2011 1041       RADIOGRAPHIC STUDIES: Dg Lumbar Spine 2-3 Views  05/22/2011  *RADIOLOGY REPORT*  Clinical Data: Chest pain, low back pain.  LUMBAR SPINE - 2-3 VIEW  Comparison: Today's bone scan.  Findings: Mild chronic appearing compression through the superior endplate at L4 corresponds to the low level activity on today's bone scan.  Suspect slight compression through the superior endplate at L2.  Slight sclerosis within the superior aspect of the L2 vertebral body.  Normal alignment.  IMPRESSION: Mild compression fracture at L4.  Slight sclerosis and probable slight compression through the superior aspect of L2.  Question insufficiency versus pathologic fracture.  MRI might be helpful for further evaluation.  Original Report Authenticated By: Cyndie Chime, M.D.   Nm Bone Scan Whole Body  05/22/2011  *RADIOLOGY REPORT*  Clinical Data: Multiple myeloma.  Right chest pain extending into the back.  No known injury.   NUCLEAR MEDICINE WHOLE BODY BONE SCINTIGRAPHY  Technique:  Whole body anterior and posterior images were obtained approximately 3 hours after intravenous injection of radiopharmaceutical.  Radiopharmaceutical: 23.6 mCi technetium 71m MDP intravenously.  Comparison: Chest radiographs 03/23/2011, thoracic MRI 02/04/2011 and lumbar spine radiographs 07/23/2010.  Findings: There is focally increased activity at L2, seen on the anterior and posterior images and suspicious for a pathologic fracture.  Mild anterior activity at L4 likely corresponds with a chronic superior endplate compression deformity at that level. There is no other focal spinal activity.  The pelvic activity is minimally heterogeneous.  There is mild degenerative activity at the knees, ankles and shoulders.  There is probable periodontal activity centrally in the maxilla.  The soft tissue activity appears normal.  IMPRESSION:  1.  Focal activity at L2 is without correlative finding on available prior examinations and suggests a pathologic fracture. Plain film correlation suggested. 2.  Low-level activity in L4 likely corresponds with preexisting superior endplate compression deformity. No other suspicious osseous activity.  Original Report Authenticated By: Gerrianne Scale, M.D.    ASSESSMENT: This is a very pleasant 60 years old [place] American female with history of multiple myeloma currently on treatment with Velcade, Doxil and Decadron status post 3 cycles. The patient is tolerating her treatment fairly well and she has significant improvement in her disease. I discussed the lab result with the patient and her daughter   PLAN: I recommend for her to continue with the same treatment regimen for now. She would receive cycle #4 today. The patient would come back for followup visit in 3 weeks with the start of cycle #5. Her back pain has significantly improved but the patient may benefit from palliative radiotherapy to the L2 lesion under the  care of Dr. Mitzi Hansen.  All questions were answered. The patient knows to call the clinic with any problems, questions or concerns. We can certainly see the patient much sooner if necessary.

## 2011-06-18 NOTE — Telephone Encounter (Signed)
gve the pt her dec,jan 2013 appt calendar °

## 2011-06-21 ENCOUNTER — Ambulatory Visit (HOSPITAL_BASED_OUTPATIENT_CLINIC_OR_DEPARTMENT_OTHER): Payer: Medicare HMO

## 2011-06-21 VITALS — BP 104/69 | HR 96 | Temp 97.4°F

## 2011-06-21 DIAGNOSIS — C9000 Multiple myeloma not having achieved remission: Secondary | ICD-10-CM

## 2011-06-21 DIAGNOSIS — Z5112 Encounter for antineoplastic immunotherapy: Secondary | ICD-10-CM

## 2011-06-21 MED ORDER — SODIUM CHLORIDE 0.9 % IV SOLN
Freq: Once | INTRAVENOUS | Status: AC
  Administered 2011-06-21: 12:00:00 via INTRAVENOUS

## 2011-06-21 MED ORDER — SODIUM CHLORIDE 0.9 % IJ SOLN
10.0000 mL | INTRAMUSCULAR | Status: DC | PRN
  Administered 2011-06-21: 10 mL
  Filled 2011-06-21: qty 10

## 2011-06-21 MED ORDER — HEPARIN SOD (PORK) LOCK FLUSH 100 UNIT/ML IV SOLN
500.0000 [IU] | Freq: Once | INTRAVENOUS | Status: AC | PRN
  Administered 2011-06-21: 500 [IU]
  Filled 2011-06-21: qty 5

## 2011-06-21 MED ORDER — SODIUM CHLORIDE 0.9 % IV SOLN: 8.0000 mg | Freq: Once | INTRAVENOUS | Status: DC

## 2011-06-21 MED ORDER — ONDANSETRON 8 MG/50ML IVPB (CHCC)
8.0000 mg | Freq: Once | INTRAVENOUS | Status: AC
  Administered 2011-06-21: 8 mg via INTRAVENOUS

## 2011-06-21 MED ORDER — BORTEZOMIB CHEMO IV INJECTION 3.5 MG
1.3000 mg/m2 | Freq: Once | INTRAMUSCULAR | Status: AC
  Administered 2011-06-21: 2.6 mg via INTRAVENOUS
  Filled 2011-06-21: qty 2.6

## 2011-06-21 NOTE — Patient Instructions (Signed)
1240 Pt ambulatory upon discharge and verbalized understanding of next appt.

## 2011-06-24 ENCOUNTER — Other Ambulatory Visit (HOSPITAL_BASED_OUTPATIENT_CLINIC_OR_DEPARTMENT_OTHER): Payer: Medicare HMO | Admitting: Lab

## 2011-06-24 DIAGNOSIS — C9001 Multiple myeloma in remission: Secondary | ICD-10-CM

## 2011-06-24 LAB — CBC WITH DIFFERENTIAL/PLATELET
BASO%: 0.3 % (ref 0.0–2.0)
Basophils Absolute: 0 10*3/uL (ref 0.0–0.1)
EOS%: 0.3 % (ref 0.0–7.0)
HCT: 32.5 % — ABNORMAL LOW (ref 34.8–46.6)
MCH: 32.3 pg (ref 25.1–34.0)
MCHC: 34.5 g/dL (ref 31.5–36.0)
MCV: 93.7 fL (ref 79.5–101.0)
MONO%: 7.7 % (ref 0.0–14.0)
NEUT#: 2.7 10*3/uL (ref 1.5–6.5)
NEUT%: 84 % — ABNORMAL HIGH (ref 38.4–76.8)
WBC: 3.2 10*3/uL — ABNORMAL LOW (ref 3.9–10.3)
lymph#: 0.3 10*3/uL — ABNORMAL LOW (ref 0.9–3.3)

## 2011-06-24 LAB — COMPREHENSIVE METABOLIC PANEL
AST: 32 U/L (ref 0–37)
Alkaline Phosphatase: 66 U/L (ref 39–117)
BUN: 7 mg/dL (ref 6–23)
Creatinine, Ser: 0.78 mg/dL (ref 0.50–1.10)
Glucose, Bld: 101 mg/dL — ABNORMAL HIGH (ref 70–99)
Potassium: 3.4 mEq/L — ABNORMAL LOW (ref 3.5–5.3)
Total Bilirubin: 0.6 mg/dL (ref 0.3–1.2)

## 2011-06-26 ENCOUNTER — Other Ambulatory Visit: Payer: Self-pay

## 2011-06-26 ENCOUNTER — Emergency Department (HOSPITAL_COMMUNITY): Payer: Medicare HMO

## 2011-06-26 ENCOUNTER — Emergency Department (HOSPITAL_COMMUNITY)
Admission: EM | Admit: 2011-06-26 | Discharge: 2011-06-26 | Disposition: A | Discharge status: Home / Self Care | Payer: Medicare HMO | Attending: Emergency Medicine | Admitting: Emergency Medicine

## 2011-06-26 DIAGNOSIS — R197 Diarrhea, unspecified: Secondary | ICD-10-CM | POA: Insufficient documentation

## 2011-06-26 DIAGNOSIS — R5381 Other malaise: Secondary | ICD-10-CM | POA: Insufficient documentation

## 2011-06-26 DIAGNOSIS — K1379 Other lesions of oral mucosa: Secondary | ICD-10-CM

## 2011-06-26 DIAGNOSIS — E039 Hypothyroidism, unspecified: Secondary | ICD-10-CM | POA: Insufficient documentation

## 2011-06-26 DIAGNOSIS — Z79899 Other long term (current) drug therapy: Secondary | ICD-10-CM | POA: Insufficient documentation

## 2011-06-26 DIAGNOSIS — R112 Nausea with vomiting, unspecified: Secondary | ICD-10-CM | POA: Insufficient documentation

## 2011-06-26 DIAGNOSIS — K1370 Unspecified lesions of oral mucosa: Secondary | ICD-10-CM | POA: Insufficient documentation

## 2011-06-26 DIAGNOSIS — R42 Dizziness and giddiness: Secondary | ICD-10-CM | POA: Insufficient documentation

## 2011-06-26 DIAGNOSIS — C9000 Multiple myeloma not having achieved remission: Secondary | ICD-10-CM | POA: Insufficient documentation

## 2011-06-26 DIAGNOSIS — R5383 Other fatigue: Secondary | ICD-10-CM | POA: Insufficient documentation

## 2011-06-26 LAB — PROTIME-INR
INR: 1.07 (ref 0.00–1.49)
Prothrombin Time: 14.1 seconds (ref 11.6–15.2)

## 2011-06-26 LAB — CBC
Hemoglobin: 10.3 g/dL — ABNORMAL LOW (ref 12.0–15.0)
MCH: 32.5 pg (ref 26.0–34.0)
RBC: 3.17 MIL/uL — ABNORMAL LOW (ref 3.87–5.11)

## 2011-06-26 LAB — ABO/RH: ABO/RH(D): O POS

## 2011-06-26 LAB — COMPREHENSIVE METABOLIC PANEL
ALT: 13 U/L (ref 0–35)
Albumin: 3.4 g/dL — ABNORMAL LOW (ref 3.5–5.2)
Alkaline Phosphatase: 79 U/L (ref 39–117)
BUN: 9 mg/dL (ref 6–23)
Potassium: 3.6 mEq/L (ref 3.5–5.1)
Sodium: 135 mEq/L (ref 135–145)
Total Protein: 7 g/dL (ref 6.0–8.3)

## 2011-06-26 LAB — LACTIC ACID, PLASMA: Lactic Acid, Venous: 0.5 mmol/L (ref 0.5–2.2)

## 2011-06-26 LAB — URINALYSIS, ROUTINE W REFLEX MICROSCOPIC
Bilirubin Urine: NEGATIVE
Nitrite: NEGATIVE
Specific Gravity, Urine: 1.023 (ref 1.005–1.030)
pH: 7.5 (ref 5.0–8.0)

## 2011-06-26 MED ORDER — SODIUM CHLORIDE 0.9 % IV SOLN
999.0000 mL | Freq: Once | INTRAVENOUS | Status: AC
  Administered 2011-06-26: 999 mL via INTRAVENOUS

## 2011-06-26 MED ORDER — HYDROCODONE-ACETAMINOPHEN 7.5-750 MG PO TABS: 1.0000 | ORAL_TABLET | Freq: Four times a day (QID) | ORAL | Status: AC | PRN

## 2011-06-26 MED ORDER — LIDOCAINE VISCOUS 2 % MT SOLN
20.0000 mL | Freq: Once | OROMUCOSAL | Status: AC
  Administered 2011-06-26: 20 mL via OROMUCOSAL
  Filled 2011-06-26: qty 20

## 2011-06-26 MED ORDER — HEPARIN SOD (PORK) LOCK FLUSH 100 UNIT/ML IV SOLN
INTRAVENOUS | Status: AC
  Administered 2011-06-26: 500 [IU] via INTRAVENOUS
  Filled 2011-06-26: qty 5

## 2011-06-26 MED ORDER — LIDOCAINE VISCOUS 2 % MT SOLN: 10.0000 mL | Freq: Once | OROMUCOSAL | Status: AC

## 2011-06-26 NOTE — ED Notes (Signed)
QIH:[id number]<VZ> Expected date:06/26/11<BR> Expected time: 4:28 PM<BR> Means of arrival:Ambulance<BR> Comments:<BR> EMS 40 GC, 89 yof fall

## 2011-06-26 NOTE — ED Provider Notes (Signed)
History     CSN: [id number]  Arrival date & time 06/26/11  1349   First MD Initiated Contact with Patient 06/26/11 1503      Chief Complaint  Patient presents with  . Dizziness  . Mouth Lesions    (Consider location/radiation/quality/duration/timing/severity/associated sxs/prior treatment) HPI The patient presents with fatigue and near syncope. She notes that such she recently initiated therapy for multiple myeloma she has been progressively more fatigued. Notably over the past 5 days since her most recent chemotherapy session her fatigue has become incapacitating. Today just prior to presentation the patient had a near syncopal event, which occurred after standing up. No true syncope. No concurrent disorientation, chest pain, dyspnea. The patient does note anorexia, nausea, vomiting diarrhea. No relief with anything. Symptoms seem to be worse after each episode of chemotherapy. Past Medical History  Diagnosis Date  . Cancer multiple myeloma  . Thyroid disease Hypothyroidism  . Hypercholesterolemia   . Compression fracture 04/08/2007    pathologic compression fracture  . Hypothyroidism   . FHx: chemotherapy     s/p 5 cycle revlimid/low dose decadron,s/p velcade,doxil,decadron,  . Hx of radiation therapy 05/05/07-05/18/07,& 03/05/11-03/21/11-    l3&l5 in 2008, t2-t6 03/2011  . GERD (gastroesophageal reflux disease)   . Insomnia     associated with steroids  . Nausea   . Constipation     takes oxycontin,vicodin    Past Surgical History  Procedure Date  . Cholecystectomy   . Knee surgery   . Knee surgery     Family History  Problem Relation Age of Onset  . Cancer Brother     lung ca  . Cancer Maternal Uncle     colon  . Cancer Maternal Grandmother     breast ca    History  Substance Use Topics  . Smoking status: Former Smoker -- 0.2 packs/day for 1 years  . Smokeless tobacco: Not on file  . Alcohol Use:     OB History    Grav Para Term Preterm Abortions TAB  SAB Ect Mult Living                  Review of Systems  Constitutional: Positive for activity change, appetite change and unexpected weight change.  HENT: Negative.   Eyes: Negative.   Respiratory: Negative.   Cardiovascular: Negative.   Gastrointestinal: Positive for nausea, vomiting and diarrhea.  Genitourinary: Negative for dysuria.  Musculoskeletal: Negative.   Skin: Negative for pallor and rash.  Neurological: Positive for weakness.  Psychiatric/Behavioral: Negative.     Allergies  Review of patient's allergies indicates no known allergies.  Home Medications   Current Outpatient Rx  Name Route Sig Dispense Refill  . MAGIC MOUTHWASH Oral Take 5 mLs by mouth as needed.      Marland Kitchen VITAMIN D 1000 UNITS PO TABS Oral Take 4,000 Units by mouth daily.     . DULOXETINE HCL 30 MG PO CPEP Oral Take 30 mg by mouth daily.      Marland Kitchen ESZOPICLONE 1 MG PO TABS Oral Take 1 mg by mouth at bedtime. Take immediately before bedtime    . GABAPENTIN 100 MG PO CAPS Oral Take 100 mg by mouth 4 (four) times daily.     Marland Kitchen HYDROCHLOROTHIAZIDE 25 MG PO TABS Oral Take 25 mg by mouth daily.     Marland Kitchen HYDROCODONE-ACETAMINOPHEN 7.5-750 MG PO TABS Oral Take 1 tablet by mouth every 6 (six) hours as needed. 60 tablet 0  . LEVOTHYROXINE SODIUM 100 MCG  PO TABS Oral Take 100 mcg by mouth daily. 1 tab x 3 days a week Monday,wednesday,friday 1/2 tab all others day    . LIDOCAINE-PRILOCAINE 2.5-2.5 % EX CREA Topical Apply topically as needed.      Marland Kitchen MAGNESIUM CHLORIDE 64 MG PO TBEC Oral Take 1 tablet by mouth daily.      . NON FORMULARY Topical Apply topically 1 day or 1 dose. Alra Deodorant     . OXYCODONE HCL ER 20 MG PO TB12 Oral Take 20 mg by mouth every 12 (twelve) hours. Pt now primarily taking 1 tab at night    . PANTOPRAZOLE SODIUM 40 MG PO TBEC  TAKE 1 TABLET BY MOUTH EVERY DAY 30 tablet 1  . PRESCRIPTION MEDICATION  PT GETS CHEMO AT [facility]. LAST TREATMENT WAS ON Friday  ( 06-20-11) NEXT TREATMENT'S DUE 07-01-11.  FOLLOWED BY DR [facility]     . PROCHLORPERAZINE MALEATE 10 MG PO TABS Oral Take 10 mg by mouth every 6 (six) hours as needed. nAUSEA    . RADIAPLEX EX Apply externally Apply topically daily.      Marland Kitchen EMOLLIENT BASE EX CREA Topical Apply topically as needed.      Marland Kitchen FEXOFENADINE-PSEUDOEPHED ER 180-240 MG PO TB24 Oral Take 1 tablet by mouth daily as needed.        BP 78/49  Pulse 90  Temp(Src) 99 F (37.2 C) (Oral)  Resp 16  SpO2 98%  Physical Exam  Nursing note and vitals reviewed. Constitutional: She is oriented to person, place, and time. She appears well-developed and well-nourished. No distress.  HENT:  Head: Normocephalic and atraumatic.  Eyes: Conjunctivae and EOM are normal.  Cardiovascular: Normal rate and regular rhythm.   Pulmonary/Chest: Effort normal and breath sounds normal. No stridor. No respiratory distress.       Chemotherapy port, clean, dry, intact and right upper chest  Abdominal: She exhibits no distension.  Musculoskeletal: She exhibits no edema.  Neurological: She is alert and oriented to person, place, and time. No cranial nerve deficit.  Skin: Skin is warm and dry.  Psychiatric: She has a normal mood and affect.    ED Course  Procedures (including critical care time)   Labs Reviewed  TYPE AND SCREEN  COMPREHENSIVE METABOLIC PANEL  LACTIC ACID, PLASMA  PROTIME-INR  URINALYSIS, ROUTINE W REFLEX MICROSCOPIC  CBC   No results found.   No diagnosis found.   Date: 06/26/2011  Rate: 82  Rhythm: normal sinus rhythm  QRS Axis: normal  Intervals: normal  ST/T Wave abnormalities: nonspecific T wave changes  Conduction Disutrbances:none  Narrative Interpretation:   Old EKG Reviewed: changes noted ABNORMAL ECG  Monitor: 89SR, NORMAL PulseOx 97% RA, normal  Filed Vitals:   06/26/11 1613  BP: 128/76  Pulse: 68  Temp:   Resp: 14    MDM  This 60 year old female with multiple myeloma presents several days after her most recent chemotherapy  session with mouth sores. On exam the patient is in no distress, and 2 vital signs reading of hypotension seems spurious (orthostatic?), given the absence of fever, distress, cough, or other overt signs of infection. Following evaluation with essentially unremarkable labs, the patient was tolerant of by mouth and was discharged in stable condition to continue having her multiple myeloma evaluated and managed by her team of physicians.  A prolonged discussion was held with the patient regarding the benefits and risks of inpatient care. Given her current absence of overt infectious stigmata, and her relatively immunocompromised state  following chemotherapy admission would likely decrease her to increased risk of infection. Given patient is afebrile, tolerant of by mouth with stable vital signs she is more appropriate for discharged with outpatient management. Explicit return precautions were provided, with good understanding by the patient.      Gerhard Munch, MD 06/26/11 1901

## 2011-06-26 NOTE — ED Notes (Signed)
1 wk of nausea and dizzyness states that she has had inner ear before but these symtpoms are different, pt is a chemo for myloma ca bp  72/53

## 2011-06-27 LAB — TYPE AND SCREEN
ABO/RH(D): O POS
Antibody Screen: NEGATIVE

## 2011-07-01 ENCOUNTER — Other Ambulatory Visit: Payer: Medicare HMO | Admitting: Lab

## 2011-07-01 ENCOUNTER — Ambulatory Visit (HOSPITAL_BASED_OUTPATIENT_CLINIC_OR_DEPARTMENT_OTHER): Payer: Medicare HMO

## 2011-07-01 ENCOUNTER — Other Ambulatory Visit: Payer: Self-pay | Admitting: Internal Medicine

## 2011-07-01 VITALS — BP 100/65 | HR 97 | Temp 98.9°F

## 2011-07-01 DIAGNOSIS — Z5112 Encounter for antineoplastic immunotherapy: Secondary | ICD-10-CM

## 2011-07-01 DIAGNOSIS — C9000 Multiple myeloma not having achieved remission: Secondary | ICD-10-CM

## 2011-07-01 LAB — COMPREHENSIVE METABOLIC PANEL
ALT: 15 U/L (ref 0–35)
Albumin: 3.8 g/dL (ref 3.5–5.2)
CO2: 21 mEq/L (ref 19–32)
Calcium: 9.3 mg/dL (ref 8.4–10.5)
Chloride: 103 mEq/L (ref 96–112)
Sodium: 133 mEq/L — ABNORMAL LOW (ref 135–145)
Total Protein: 6.5 g/dL (ref 6.0–8.3)

## 2011-07-01 LAB — CBC WITH DIFFERENTIAL/PLATELET
BASO%: 0.3 % (ref 0.0–2.0)
HCT: 32.1 % — ABNORMAL LOW (ref 34.8–46.6)
MCHC: 35.3 g/dL (ref 31.5–36.0)
MONO#: 0.3 10*3/uL (ref 0.1–0.9)
NEUT%: 70.6 % (ref 38.4–76.8)
RBC: 3.33 10*6/uL — ABNORMAL LOW (ref 3.70–5.45)
WBC: 1.9 10*3/uL — ABNORMAL LOW (ref 3.9–10.3)
lymph#: 0.2 10*3/uL — ABNORMAL LOW (ref 0.9–3.3)

## 2011-07-01 LAB — LACTATE DEHYDROGENASE: LDH: 563 U/L — ABNORMAL HIGH (ref 94–250)

## 2011-07-01 MED ORDER — BORTEZOMIB CHEMO IV INJECTION 3.5 MG
1.3000 mg/m2 | Freq: Once | INTRAMUSCULAR | Status: AC
  Administered 2011-07-01: 2.6 mg via INTRAVENOUS
  Filled 2011-07-01: qty 2.6

## 2011-07-01 MED ORDER — SODIUM CHLORIDE 0.9 % IV SOLN: 8.0000 mg | Freq: Once | INTRAVENOUS | Status: DC

## 2011-07-01 MED ORDER — ONDANSETRON 8 MG/50ML IVPB (CHCC)
8.0000 mg | Freq: Once | INTRAVENOUS | Status: AC
  Administered 2011-07-01: 8 mg via INTRAVENOUS

## 2011-07-01 MED ORDER — HEPARIN SOD (PORK) LOCK FLUSH 100 UNIT/ML IV SOLN
500.0000 [IU] | Freq: Once | INTRAVENOUS | Status: AC | PRN
  Administered 2011-07-01: 500 [IU]
  Filled 2011-07-01: qty 5

## 2011-07-01 MED ORDER — SODIUM CHLORIDE 0.9 % IJ SOLN
10.0000 mL | INTRAMUSCULAR | Status: DC | PRN
  Administered 2011-07-01: 10 mL
  Filled 2011-07-01: qty 10

## 2011-07-01 MED ORDER — SODIUM CHLORIDE 0.9 % IV SOLN
Freq: Once | INTRAVENOUS | Status: AC
  Administered 2011-07-01: 10:00:00 via INTRAVENOUS

## 2011-07-01 NOTE — Patient Instructions (Signed)
Patient discharged home with daughter; aware of next appointment. 

## 2011-07-01 NOTE — Progress Notes (Signed)
Positive blood return pre Velcade push; patient tolerated well.

## 2011-07-03 ENCOUNTER — Ambulatory Visit: Payer: Medicare HMO

## 2011-07-03 ENCOUNTER — Other Ambulatory Visit: Payer: Medicare HMO | Admitting: Lab

## 2011-07-04 ENCOUNTER — Other Ambulatory Visit (HOSPITAL_BASED_OUTPATIENT_CLINIC_OR_DEPARTMENT_OTHER): Payer: Medicare HMO | Admitting: Lab

## 2011-07-04 ENCOUNTER — Ambulatory Visit (HOSPITAL_BASED_OUTPATIENT_CLINIC_OR_DEPARTMENT_OTHER): Payer: Medicare HMO

## 2011-07-04 VITALS — BP 166/69 | HR 92 | Temp 98.3°F

## 2011-07-04 DIAGNOSIS — C9000 Multiple myeloma not having achieved remission: Secondary | ICD-10-CM

## 2011-07-04 DIAGNOSIS — Z5112 Encounter for antineoplastic immunotherapy: Secondary | ICD-10-CM

## 2011-07-04 DIAGNOSIS — C9001 Multiple myeloma in remission: Secondary | ICD-10-CM

## 2011-07-04 LAB — COMPREHENSIVE METABOLIC PANEL
ALT: 15 U/L (ref 0–35)
AST: 23 U/L (ref 0–37)
CO2: 21 mEq/L (ref 19–32)
Calcium: 8.9 mg/dL (ref 8.4–10.5)
Chloride: 106 mEq/L (ref 96–112)
Potassium: 3.5 mEq/L (ref 3.5–5.3)
Sodium: 138 mEq/L (ref 135–145)
Total Protein: 5.8 g/dL — ABNORMAL LOW (ref 6.0–8.3)

## 2011-07-04 LAB — CBC WITH DIFFERENTIAL/PLATELET
Eosinophils Absolute: 0 10*3/uL (ref 0.0–0.5)
MONO#: 0.4 10*3/uL (ref 0.1–0.9)
NEUT#: 1.2 10*3/uL — ABNORMAL LOW (ref 1.5–6.5)
Platelets: 98 10*3/uL — ABNORMAL LOW (ref 145–400)
RBC: 3.41 10*6/uL — ABNORMAL LOW (ref 3.70–5.45)
RDW: 14.5 % (ref 11.2–14.5)
WBC: 1.9 10*3/uL — ABNORMAL LOW (ref 3.9–10.3)
lymph#: 0.2 10*3/uL — ABNORMAL LOW (ref 0.9–3.3)
nRBC: 0 % (ref 0–0)

## 2011-07-04 LAB — TECHNOLOGIST REVIEW

## 2011-07-04 MED ORDER — SODIUM CHLORIDE 0.9 % IV SOLN: Freq: Once | INTRAVENOUS | Status: DC

## 2011-07-04 MED ORDER — HEPARIN SOD (PORK) LOCK FLUSH 100 UNIT/ML IV SOLN
500.0000 [IU] | Freq: Once | INTRAVENOUS | Status: AC | PRN
  Administered 2011-07-04: 500 [IU]
  Filled 2011-07-04: qty 5

## 2011-07-04 MED ORDER — BORTEZOMIB CHEMO IV INJECTION 3.5 MG
1.3000 mg/m2 | Freq: Once | INTRAMUSCULAR | Status: AC
  Administered 2011-07-04: 2.6 mg via INTRAVENOUS
  Filled 2011-07-04: qty 2.6

## 2011-07-04 MED ORDER — SODIUM CHLORIDE 0.9 % IJ SOLN
10.0000 mL | INTRAMUSCULAR | Status: DC | PRN
  Administered 2011-07-04: 10 mL
  Filled 2011-07-04: qty 10

## 2011-07-04 MED ORDER — DOXORUBICIN HCL LIPOSOMAL CHEMO INJECTION 2 MG/ML
30.0000 mg/m2 | Freq: Once | INTRAVENOUS | Status: AC
  Administered 2011-07-04: 60 mg via INTRAVENOUS
  Filled 2011-07-04: qty 30

## 2011-07-04 MED ORDER — ONDANSETRON 16 MG/50ML IVPB (CHCC)
16.0000 mg | Freq: Once | INTRAVENOUS | Status: AC
  Administered 2011-07-04: 16 mg via INTRAVENOUS

## 2011-07-04 NOTE — Patient Instructions (Signed)
Pt. D/c'd to home with son, tol. Chemo well, no c/o's, mouth much better but still has some ulcers/soreness, port gave good blood return before & after doxil & velcade, site unremarkable.

## 2011-07-04 NOTE — Progress Notes (Signed)
OK to treat despite low WBC/ANC per Dr. Arbutus Ped.

## 2011-07-08 ENCOUNTER — Ambulatory Visit (HOSPITAL_BASED_OUTPATIENT_CLINIC_OR_DEPARTMENT_OTHER): Payer: Medicare HMO | Admitting: Physician Assistant

## 2011-07-08 ENCOUNTER — Encounter: Payer: Self-pay | Admitting: Physician Assistant

## 2011-07-08 ENCOUNTER — Ambulatory Visit (HOSPITAL_BASED_OUTPATIENT_CLINIC_OR_DEPARTMENT_OTHER): Payer: Medicare HMO

## 2011-07-08 ENCOUNTER — Other Ambulatory Visit (HOSPITAL_BASED_OUTPATIENT_CLINIC_OR_DEPARTMENT_OTHER): Payer: Medicare HMO | Admitting: Lab

## 2011-07-08 ENCOUNTER — Telehealth: Payer: Self-pay | Admitting: Internal Medicine

## 2011-07-08 DIAGNOSIS — C9000 Multiple myeloma not having achieved remission: Secondary | ICD-10-CM

## 2011-07-08 DIAGNOSIS — Z79899 Other long term (current) drug therapy: Secondary | ICD-10-CM

## 2011-07-08 DIAGNOSIS — Z5112 Encounter for antineoplastic immunotherapy: Secondary | ICD-10-CM

## 2011-07-08 DIAGNOSIS — C9001 Multiple myeloma in remission: Secondary | ICD-10-CM

## 2011-07-08 LAB — CBC WITH DIFFERENTIAL/PLATELET
BASO%: 0.3 % (ref 0.0–2.0)
EOS%: 0.6 % (ref 0.0–7.0)
HCT: 29.1 % — ABNORMAL LOW (ref 34.8–46.6)
MCH: 31.7 pg (ref 25.1–34.0)
MCHC: 34.4 g/dL (ref 31.5–36.0)
MCV: 92.4 fL (ref 79.5–101.0)
MONO%: 13.8 % (ref 0.0–14.0)
NEUT%: 77.9 % — ABNORMAL HIGH (ref 38.4–76.8)
RDW: 14.5 % (ref 11.2–14.5)
lymph#: 0.2 10*3/uL — ABNORMAL LOW (ref 0.9–3.3)

## 2011-07-08 LAB — COMPREHENSIVE METABOLIC PANEL
AST: 20 U/L (ref 0–37)
Albumin: 3.5 g/dL (ref 3.5–5.2)
Alkaline Phosphatase: 84 U/L (ref 39–117)
BUN: 10 mg/dL (ref 6–23)
Glucose, Bld: 98 mg/dL (ref 70–99)
Potassium: 3.9 mEq/L (ref 3.5–5.3)
Total Bilirubin: 0.6 mg/dL (ref 0.3–1.2)

## 2011-07-08 MED ORDER — SODIUM CHLORIDE 0.9 % IJ SOLN
10.0000 mL | INTRAMUSCULAR | Status: DC | PRN
  Administered 2011-07-08: 10 mL
  Filled 2011-07-08: qty 10

## 2011-07-08 MED ORDER — BORTEZOMIB CHEMO IV INJECTION 3.5 MG
1.3000 mg/m2 | Freq: Once | INTRAMUSCULAR | Status: AC
  Administered 2011-07-08: 2.6 mg via INTRAVENOUS
  Filled 2011-07-08: qty 2.6

## 2011-07-08 MED ORDER — ONDANSETRON 8 MG/50ML IVPB (CHCC)
8.0000 mg | Freq: Once | INTRAVENOUS | Status: AC
  Administered 2011-07-08: 8 mg via INTRAVENOUS

## 2011-07-08 MED ORDER — SODIUM CHLORIDE 0.9 % IV SOLN
INTRAVENOUS | Status: DC
  Administered 2011-07-08: 16:00:00 via INTRAVENOUS

## 2011-07-08 MED ORDER — HEPARIN SOD (PORK) LOCK FLUSH 100 UNIT/ML IV SOLN
500.0000 [IU] | Freq: Once | INTRAVENOUS | Status: AC | PRN
  Administered 2011-07-08: 500 [IU]
  Filled 2011-07-08: qty 5

## 2011-07-08 MED ORDER — HYDROCODONE-ACETAMINOPHEN 7.5-750 MG PO TABS: 1.0000 | ORAL_TABLET | Freq: Four times a day (QID) | ORAL | Status: DC | PRN

## 2011-07-08 MED ORDER — SODIUM CHLORIDE 0.9 % IV SOLN: 8.0000 mg | Freq: Once | INTRAVENOUS | Status: DC

## 2011-07-08 MED ORDER — METHYLPREDNISOLONE 4 MG PO KIT: PACK | ORAL | Status: AC

## 2011-07-08 NOTE — Telephone Encounter (Signed)
appt made and printed for 08/05/11   aom

## 2011-07-10 ENCOUNTER — Telehealth: Payer: Self-pay | Admitting: *Deleted

## 2011-07-10 NOTE — Progress Notes (Signed)
[facility] OFFICE PROGRESS NOTE  [name],[name], PA, [name] No address on file  DIAGNOSIS: Recurrent multiple myeloma initially diagnosed in October 2008.   PRIOR THERAPY:  1. Status post palliative radiotherapy to the lumbar spine between L3 and L5. The patient received a total dose of 3000 cGy in 10 fractions under the care of Dr. Mitzi Hansen between May 05, 2007 through May 18, 2007. 2. Status post 5 cycles of systemic chemotherapy with Revlimid and low-dose Decadron with good response to this treatment. 3. Status post autologous peripheral blood stem cell transplant at [facility] on Nov 20, 2007 under the care of Dr. Vicente Serene. 4. Status post treatment for disease recurrence with Velcade, Doxil and Decadron. Last dose given Nov 09, 2009. Discontinued secondary to intolerance but the patient had a good response to treatment at that time. 5. Status post palliative radiotherapy to the T2-T6 thoracic vertebrae completed 03/21/2011 under the care of Dr. Mitzi Hansen. 6.  CURRENT THERAPY: : Systemic chemotherapy with Velcade 1.3 mg per meter squared given on days 1, 4, 8 and 11, and Doxil at 30 mg per meter squared given on day 4 in addition to Decadron status post 4 cycles and the day 1 and 4 of cycle 4.   INTERVAL HISTORY: [name] 61 y.o. female returns to the clinic today for followup visit accompanied by her son. She reports that she had a significant mouth sores that necessitated a visit to the hospital. She was given IV fluids as well as Magic mouth wash and viscous lidocaine. She complains of nausea as well as frequent episodes of diarrhea. She states that she has a maximum of 3-4 episodes daily. She also states that she really is not eating very much and is not drinking very much either. Her son concurs that she might E. one meal a day and then does not eat all of it. She might drink according to her estimation 40 ounces of fluid a day but this may not be accurate. She  complains of her hands and feet being dry and painful as well as being dark and hyperpigmented. She is interested in taking a break and her son feels that this might be helpful for her in order to be the better as the day that she takes Velcade and Doxil seems to be the hardest on her.   MEDICAL HISTORY: Past Medical History  Diagnosis Date  . Cancer multiple myeloma  . Thyroid disease Hypothyroidism  . Hypercholesterolemia   . Compression fracture 04/08/2007    pathologic compression fracture  . Hypothyroidism   . FHx: chemotherapy     s/p 5 cycle revlimid/low dose decadron,s/p velcade,doxil,decadron,  . Hx of radiation therapy 05/05/07-05/18/07,& 03/05/11-03/21/11-    l3&l5 in 2008, t2-t6 03/2011  . GERD (gastroesophageal reflux disease)   . Insomnia     associated with steroids  . Nausea   . Constipation     takes oxycontin,vicodin    ALLERGIES:   has no known allergies.  MEDICATIONS:  Current Outpatient Prescriptions  Medication Sig Dispense Refill  . Alum & Mag Hydroxide-Simeth (MAGIC MOUTHWASH) SOLN Take 5 mLs by mouth as needed.        . cholecalciferol (VITAMIN D) 1000 UNITS tablet Take 4,000 Units by mouth daily.       Marland Kitchen dexamethasone (DECADRON) 4 MG tablet       . DULoxetine (CYMBALTA) 30 MG capsule Take 30 mg by mouth daily.        Marland Kitchen emollient (BIAFINE)  cream Apply topically as needed.        . eszopiclone (LUNESTA) 1 MG TABS Take 1 mg by mouth at bedtime. Take immediately before bedtime      . fexofenadine-pseudoephedrine (ALLEGRA-D 24) 180-240 MG per 24 hr tablet Take 1 tablet by mouth daily as needed.        . gabapentin (NEURONTIN) 100 MG capsule Take 100 mg by mouth 4 (four) times daily.       Marland Kitchen HYDROcodone-acetaminophen (VICODIN ES) 7.5-750 MG per tablet Take 1 tablet by mouth every 6 (six) hours as needed.  60 tablet  0  . levothyroxine (SYNTHROID, LEVOTHROID) 100 MCG tablet Take 100 mcg by mouth daily. 1 tab x 3 days a week Monday,wednesday,friday 1/2 tab all  others day      . lidocaine (XYLOCAINE) 2 % solution       . lidocaine-prilocaine (EMLA) cream Apply topically as needed.        . magnesium chloride (SLOW-MAG) 64 MG TBEC Take 1 tablet by mouth daily.        . NON FORMULARY Apply topically 1 day or 1 dose. Alra Deodorant       . oxyCODONE (OXYCONTIN) 20 MG 12 hr tablet Take 20 mg by mouth every 12 (twelve) hours. Pt now primarily taking 1 tab at night      . pantoprazole (PROTONIX) 40 MG tablet TAKE 1 TABLET BY MOUTH EVERY DAY  30 tablet  1  . PRESCRIPTION MEDICATION [facility]/Dr. Mohamed: Meloma Salvage DVD IV q21d Cycle 3 12/10-12/21. Cycle 4 scheduled for 12/31.      Marland Kitchen prochlorperazine (COMPAZINE) 10 MG tablet Take 10 mg by mouth every 6 (six) hours as needed. nAUSEA      . triamcinolone (KENALOG) 0.1 % paste       . Wound Cleansers (RADIAPLEX EX) Apply topically daily.        . hydrochlorothiazide (HYDRODIURIL) 25 MG tablet Take 25 mg by mouth daily.       . methylPREDNISolone (MEDROL, PAK,) 4 MG tablet follow package directions  21 tablet  0   Current Facility-Administered Medications  Medication Dose Route Frequency Provider Last Rate Last Dose  . 0.9 %  sodium chloride infusion   Intravenous Continuous Conni Slipper, [name]       Facility-Administered Medications Ordered in Other Visits  Medication Dose Route Frequency Provider Last Rate Last Dose  . 0.9 %  sodium chloride infusion   Intravenous Once Mohamed K. Mohamed, MD      . ondansetron (ZOFRAN) IVPB 8 mg  8 mg Intravenous Once Mohamed K. Mohamed, MD      . sodium chloride 0.9 % injection 10 mL  10 mL Intracatheter PRN Mohamed K. Arbutus Ped, MD        SURGICAL HISTORY:  Past Surgical History  Procedure Date  . Cholecystectomy   . Knee surgery   . Knee surgery     REVIEW OF SYSTEMS:  A comprehensive review of systems was negative except for: Constitutional: positive for anorexia, fatigue, malaise and weight loss Gastrointestinal: positive for diarrhea Musculoskeletal: positive  for back pain Hyperpigmentation of the skin tickly the hands and feet with generalized dryness   PHYSICAL EXAMINATION: General appearance: alert, cooperative and no distress Head: Normocephalic, without obvious abnormality, atraumatic Neck: no adenopathy Lymph nodes: Cervical, supraclavicular, and axillary nodes normal. Resp: clear to auscultation bilaterally Back: tenderness to palpation in the lumbar spine Cardio: regular rate and rhythm, S1, S2 normal, no murmur, click, rub or gallop  GI: soft, non-tender; bowel sounds normal; no masses,  no organomegaly Extremities: extremities normal, atraumatic, no cyanosis or edema Neurologic: Alert and oriented X 3, normal strength and tone. Normal symmetric reflexes. Normal coordination and gait Skin: hyperpigmentation of the palms of the hands and soles of the feet, generalized dryness to the skin with some cracking noted on the fingers no bleeding or evidence of superinfection  ECOG PERFORMANCE STATUS: 1 - Symptomatic but completely ambulatory  Blood pressure 90/59, pulse 77, temperature 96.7 F (35.9 C), temperature source Oral, height 5\' 5"  (1.651 m), weight 167 lb 1.6 oz (75.796 kg).  LABORATORY DATA: Lab Results  Component Value Date   WBC 3.1* 07/08/2011   HGB 10.0* 07/08/2011   HCT 29.1* 07/08/2011   MCV 92.4 07/08/2011   PLT 102* 07/08/2011      Chemistry      Component Value Date/Time   NA 137 07/08/2011 1403   K 3.9 07/08/2011 1403   CL 102 07/08/2011 1403   CO2 24 07/08/2011 1403   BUN 10 07/08/2011 1403   CREATININE 0.71 07/08/2011 1403      Component Value Date/Time   CALCIUM 9.5 07/08/2011 1403   ALKPHOS 84 07/08/2011 1403   AST 20 07/08/2011 1403   ALT 10 07/08/2011 1403   BILITOT 0.6 07/08/2011 1403       RADIOGRAPHIC STUDIES: Dg Lumbar Spine 2-3 Views  05/22/2011  *RADIOLOGY REPORT*  Clinical Data: Chest pain, low back pain.  LUMBAR SPINE - 2-3 VIEW  Comparison: Today's bone scan.  Findings: Mild chronic appearing compression through  the superior endplate at L4 corresponds to the low level activity on today's bone scan.  Suspect slight compression through the superior endplate at L2.  Slight sclerosis within the superior aspect of the L2 vertebral body.  Normal alignment.  IMPRESSION: Mild compression fracture at L4.  Slight sclerosis and probable slight compression through the superior aspect of L2.  Question insufficiency versus pathologic fracture.  MRI might be helpful for further evaluation.  Original Report Authenticated By: Cyndie Chime, M.D.   Nm Bone Scan Whole Body  05/22/2011  *RADIOLOGY REPORT*  Clinical Data: Multiple myeloma.  Right chest pain extending into the back.  No known injury.  NUCLEAR MEDICINE WHOLE BODY BONE SCINTIGRAPHY  Technique:  Whole body anterior and posterior images were obtained approximately 3 hours after intravenous injection of radiopharmaceutical.  Radiopharmaceutical: 23.6 mCi technetium 14m MDP intravenously.  Comparison: Chest radiographs 03/23/2011, thoracic MRI 02/04/2011 and lumbar spine radiographs 07/23/2010.  Findings: There is focally increased activity at L2, seen on the anterior and posterior images and suspicious for a pathologic fracture.  Mild anterior activity at L4 likely corresponds with a chronic superior endplate compression deformity at that level. There is no other focal spinal activity.  The pelvic activity is minimally heterogeneous.  There is mild degenerative activity at the knees, ankles and shoulders.  There is probable periodontal activity centrally in the maxilla.  The soft tissue activity appears normal.  IMPRESSION:  1.  Focal activity at L2 is without correlative finding on available prior examinations and suggests a pathologic fracture. Plain film correlation suggested. 2.  Low-level activity in L4 likely corresponds with preexisting superior endplate compression deformity. No other suspicious osseous activity.  Original Report Authenticated By: Gerrianne Scale, M.D.     ASSESSMENT/PLAN: This is a very pleasant 61 years old [place] American female with history of multiple myeloma currently on treatment with Velcade, Doxil and Decadron status post 3 cycles N.  day 1 and 4 of cycle 4. She is multiple complaints and these were discussed with Dr. Arbutus Ped. She will complete her current cycle of chemotherapy and then take a four-week break at which time she'll return to see Dr. Arbutus Ped with repeat protein studies to include quantitative immunoglobulin beta-2 microglobulin and serum light chains as well as a CBC differential C. met and LDH. In the interim she is encouraged to increase her by mouth intake dose of food and fluids. This may be more effectively done with several small meals throughout the day. She is also given a prescription for a Medrol Dosepak to stimulate her appetite. If she can tolerate it she is to continue on her weekly Decadron between now and time she follows up with Dr. Arbutus Ped in one month.  Conni Slipper, [name]-C    All questions were answered. The patient knows to call the clinic with any problems, questions or concerns. We can certainly see the patient much sooner if necessary.

## 2011-07-10 NOTE — Telephone Encounter (Signed)
Per Dr Mitzi Hansen, pt will be scheduled for FU at first available, Jacolyn Reedy informed and will call pt.

## 2011-07-10 NOTE — Telephone Encounter (Signed)
Pt called stating Dr Mitzi Hansen ordered scan for her, and she has not had FU w/results. Pt had bone scan Nov 2012 and her FU appt is 08/30/11. Advised pt will discuss w/dr and call her back.

## 2011-07-11 ENCOUNTER — Ambulatory Visit (HOSPITAL_BASED_OUTPATIENT_CLINIC_OR_DEPARTMENT_OTHER): Payer: Medicare HMO

## 2011-07-11 ENCOUNTER — Other Ambulatory Visit: Payer: Self-pay | Admitting: Internal Medicine

## 2011-07-11 VITALS — BP 107/67 | HR 85 | Temp 97.0°F

## 2011-07-11 DIAGNOSIS — C9000 Multiple myeloma not having achieved remission: Secondary | ICD-10-CM

## 2011-07-11 DIAGNOSIS — Z5111 Encounter for antineoplastic chemotherapy: Secondary | ICD-10-CM

## 2011-07-11 MED ORDER — BORTEZOMIB CHEMO IV INJECTION 3.5 MG
1.3000 mg/m2 | Freq: Once | INTRAMUSCULAR | Status: AC
  Administered 2011-07-11: 2.6 mg via INTRAVENOUS
  Filled 2011-07-11: qty 2.6

## 2011-07-11 MED ORDER — ONDANSETRON 8 MG/50ML IVPB (CHCC)
8.0000 mg | Freq: Once | INTRAVENOUS | Status: AC
  Administered 2011-07-11: 8 mg via INTRAVENOUS

## 2011-07-11 MED ORDER — HEPARIN SOD (PORK) LOCK FLUSH 100 UNIT/ML IV SOLN
500.0000 [IU] | Freq: Once | INTRAVENOUS | Status: AC | PRN
  Administered 2011-07-11: 500 [IU]
  Filled 2011-07-11: qty 5

## 2011-07-11 MED ORDER — SODIUM CHLORIDE 0.9 % IV SOLN
Freq: Once | INTRAVENOUS | Status: AC
  Administered 2011-07-11: 12:00:00 via INTRAVENOUS

## 2011-07-11 MED ORDER — SODIUM CHLORIDE 0.9 % IJ SOLN
10.0000 mL | INTRAMUSCULAR | Status: DC | PRN
  Administered 2011-07-11: 10 mL
  Filled 2011-07-11: qty 10

## 2011-07-11 NOTE — Patient Instructions (Signed)
Pt did not verbalize any adverse effects, discharged home with son.  Pt ambulatory upon d/c.

## 2011-07-12 ENCOUNTER — Telehealth: Payer: Self-pay | Admitting: Internal Medicine

## 2011-07-12 NOTE — Telephone Encounter (Signed)
Per Pharmacy -pt is not due for chemo until 1/21/ but she has an infusion appt for 1/14. I left message on pts phone to come for labs on Monday , but she will not be due for chemo

## 2011-07-13 ENCOUNTER — Other Ambulatory Visit: Payer: Self-pay | Admitting: Internal Medicine

## 2011-07-15 ENCOUNTER — Ambulatory Visit: Payer: Medicare HMO

## 2011-07-15 ENCOUNTER — Other Ambulatory Visit: Payer: Medicare HMO | Admitting: Lab

## 2011-07-18 ENCOUNTER — Ambulatory Visit: Payer: Medicare HMO

## 2011-07-19 ENCOUNTER — Emergency Department (HOSPITAL_COMMUNITY): Payer: Medicare HMO

## 2011-07-19 ENCOUNTER — Encounter (HOSPITAL_COMMUNITY): Payer: Self-pay | Admitting: Emergency Medicine

## 2011-07-19 ENCOUNTER — Inpatient Hospital Stay (HOSPITAL_COMMUNITY)
Admission: EM | Admit: 2011-07-19 | Discharge: 2011-08-01 | DRG: 193 | Disposition: A | Discharge status: Home / Self Care | Payer: Medicare HMO | Attending: Internal Medicine | Admitting: Internal Medicine

## 2011-07-19 ENCOUNTER — Other Ambulatory Visit: Payer: Self-pay

## 2011-07-19 DIAGNOSIS — T451X5A Adverse effect of antineoplastic and immunosuppressive drugs, initial encounter: Secondary | ICD-10-CM | POA: Diagnosis present

## 2011-07-19 DIAGNOSIS — C9000 Multiple myeloma not having achieved remission: Secondary | ICD-10-CM

## 2011-07-19 DIAGNOSIS — R531 Weakness: Secondary | ICD-10-CM

## 2011-07-19 DIAGNOSIS — J189 Pneumonia, unspecified organism: Principal | ICD-10-CM

## 2011-07-19 DIAGNOSIS — D61818 Other pancytopenia: Secondary | ICD-10-CM | POA: Diagnosis present

## 2011-07-19 DIAGNOSIS — E039 Hypothyroidism, unspecified: Secondary | ICD-10-CM

## 2011-07-19 DIAGNOSIS — R509 Fever, unspecified: Secondary | ICD-10-CM

## 2011-07-19 DIAGNOSIS — R42 Dizziness and giddiness: Secondary | ICD-10-CM

## 2011-07-19 DIAGNOSIS — Z9089 Acquired absence of other organs: Secondary | ICD-10-CM

## 2011-07-19 DIAGNOSIS — E860 Dehydration: Secondary | ICD-10-CM

## 2011-07-19 DIAGNOSIS — R079 Chest pain, unspecified: Secondary | ICD-10-CM

## 2011-07-19 DIAGNOSIS — Z87891 Personal history of nicotine dependence: Secondary | ICD-10-CM

## 2011-07-19 DIAGNOSIS — D61810 Antineoplastic chemotherapy induced pancytopenia: Secondary | ICD-10-CM | POA: Diagnosis present

## 2011-07-19 DIAGNOSIS — G4730 Sleep apnea, unspecified: Secondary | ICD-10-CM | POA: Diagnosis present

## 2011-07-19 DIAGNOSIS — Z8679 Personal history of other diseases of the circulatory system: Secondary | ICD-10-CM

## 2011-07-19 DIAGNOSIS — Z79899 Other long term (current) drug therapy: Secondary | ICD-10-CM

## 2011-07-19 DIAGNOSIS — Z923 Personal history of irradiation: Secondary | ICD-10-CM

## 2011-07-19 DIAGNOSIS — K219 Gastro-esophageal reflux disease without esophagitis: Secondary | ICD-10-CM

## 2011-07-19 DIAGNOSIS — K625 Hemorrhage of anus and rectum: Secondary | ICD-10-CM

## 2011-07-19 DIAGNOSIS — E785 Hyperlipidemia, unspecified: Secondary | ICD-10-CM | POA: Diagnosis present

## 2011-07-19 DIAGNOSIS — R5383 Other fatigue: Secondary | ICD-10-CM

## 2011-07-19 DIAGNOSIS — M5430 Sciatica, unspecified side: Secondary | ICD-10-CM

## 2011-07-19 DIAGNOSIS — R0789 Other chest pain: Secondary | ICD-10-CM

## 2011-07-19 DIAGNOSIS — R031 Nonspecific low blood-pressure reading: Secondary | ICD-10-CM | POA: Diagnosis present

## 2011-07-19 DIAGNOSIS — R5381 Other malaise: Secondary | ICD-10-CM | POA: Diagnosis present

## 2011-07-19 DIAGNOSIS — E876 Hypokalemia: Secondary | ICD-10-CM

## 2011-07-19 DIAGNOSIS — R0602 Shortness of breath: Secondary | ICD-10-CM

## 2011-07-19 DIAGNOSIS — I10 Essential (primary) hypertension: Secondary | ICD-10-CM | POA: Diagnosis present

## 2011-07-19 LAB — CARDIAC PANEL(CRET KIN+CKTOT+MB+TROPI)
CK, MB: 1.3 ng/mL (ref 0.3–4.0)
Total CK: 82 U/L (ref 7–177)

## 2011-07-19 LAB — DIFFERENTIAL
Eosinophils Relative: 1 % (ref 0–5)
Lymphocytes Relative: 6 % — ABNORMAL LOW (ref 12–46)
Monocytes Absolute: 0.2 10*3/uL (ref 0.1–1.0)
Monocytes Relative: 7 % (ref 3–12)
Neutro Abs: 2.8 10*3/uL (ref 1.7–7.7)

## 2011-07-19 LAB — URINALYSIS, ROUTINE W REFLEX MICROSCOPIC
Glucose, UA: NEGATIVE mg/dL
Hgb urine dipstick: NEGATIVE
Protein, ur: 30 mg/dL — AB

## 2011-07-19 LAB — COMPREHENSIVE METABOLIC PANEL
BUN: 7 mg/dL (ref 6–23)
CO2: 23 mEq/L (ref 19–32)
Chloride: 100 mEq/L (ref 96–112)
Creatinine, Ser: 0.56 mg/dL (ref 0.50–1.10)
GFR calc Af Amer: >90 mL/min (ref >90)
GFR calc non Af Amer: >90 mL/min (ref >90)
Total Bilirubin: 0.7 mg/dL (ref 0.3–1.2)

## 2011-07-19 LAB — CBC
HCT: 24.7 % — ABNORMAL LOW (ref 36.0–46.0)
Hemoglobin: 8.5 g/dL — ABNORMAL LOW (ref 12.0–15.0)
MCHC: 34.4 g/dL (ref 30.0–36.0)
MCV: 92.5 fL (ref 78.0–100.0)

## 2011-07-19 LAB — URINE MICROSCOPIC-ADD ON

## 2011-07-19 LAB — D-DIMER, QUANTITATIVE: D-Dimer, Quant: 8.51 ug/mL-FEU — ABNORMAL HIGH (ref 0.00–0.48)

## 2011-07-19 LAB — SAMPLE TO BLOOD BANK

## 2011-07-19 MED ORDER — PROCHLORPERAZINE MALEATE 10 MG PO TABS
10.0000 mg | ORAL_TABLET | Freq: Four times a day (QID) | ORAL | Status: DC | PRN
  Filled 2011-07-19: qty 1

## 2011-07-19 MED ORDER — HYDROCODONE-ACETAMINOPHEN 5-500 MG PO TABS: 1.5000 | ORAL_TABLET | Freq: Four times a day (QID) | ORAL | Status: DC | PRN

## 2011-07-19 MED ORDER — PIPERACILLIN-TAZOBACTAM 3.375 G IVPB
3.3750 g | Freq: Three times a day (TID) | INTRAVENOUS | Status: DC
  Administered 2011-07-20 – 2011-07-26 (×20): 3.375 g via INTRAVENOUS
  Filled 2011-07-19 (×26): qty 50

## 2011-07-19 MED ORDER — LEVOTHYROXINE SODIUM 50 MCG PO TABS
50.0000 ug | ORAL_TABLET | ORAL | Status: DC
  Administered 2011-07-20 – 2011-08-01 (×8): 50 ug via ORAL
  Filled 2011-07-19 (×8): qty 1

## 2011-07-19 MED ORDER — LIDOCAINE-PRILOCAINE 2.5-2.5 % EX CREA
TOPICAL_CREAM | CUTANEOUS | Status: DC | PRN
  Filled 2011-07-19: qty 5

## 2011-07-19 MED ORDER — SODIUM CHLORIDE 0.9 % IV BOLUS (SEPSIS)
1000.0000 mL | Freq: Once | INTRAVENOUS | Status: AC
  Administered 2011-07-19: 1000 mL via INTRAVENOUS

## 2011-07-19 MED ORDER — BENZONATATE 100 MG PO CAPS
100.0000 mg | ORAL_CAPSULE | Freq: Three times a day (TID) | ORAL | Status: DC
  Administered 2011-07-19 – 2011-08-01 (×22): 100 mg via ORAL
  Filled 2011-07-19 (×40): qty 1

## 2011-07-19 MED ORDER — ZOLPIDEM TARTRATE 5 MG PO TABS
5.0000 mg | ORAL_TABLET | Freq: Every evening | ORAL | Status: DC | PRN
  Administered 2011-07-19 – 2011-07-30 (×5): 5 mg via ORAL
  Filled 2011-07-19 (×7): qty 1

## 2011-07-19 MED ORDER — ENOXAPARIN SODIUM 40 MG/0.4ML ~~LOC~~ SOLN
40.0000 mg | SUBCUTANEOUS | Status: DC
  Administered 2011-07-19 – 2011-07-23 (×5): 40 mg via SUBCUTANEOUS
  Filled 2011-07-19 (×7): qty 0.4

## 2011-07-19 MED ORDER — IPRATROPIUM BROMIDE 0.02 % IN SOLN
0.5000 mg | RESPIRATORY_TRACT | Status: DC | PRN
  Filled 2011-07-19 (×2): qty 2.5

## 2011-07-19 MED ORDER — BISACODYL 10 MG RE SUPP: 10.0000 mg | Freq: Every day | RECTAL | Status: DC | PRN

## 2011-07-19 MED ORDER — ALUM & MAG HYDROXIDE-SIMETH 200-200-20 MG/5ML PO SUSP: 30.0000 mL | Freq: Four times a day (QID) | ORAL | Status: DC | PRN

## 2011-07-19 MED ORDER — LEVOTHYROXINE SODIUM 50 MCG PO TABS: 50.0000 ug | ORAL_TABLET | ORAL | Status: DC

## 2011-07-19 MED ORDER — GABAPENTIN 100 MG PO CAPS
100.0000 mg | ORAL_CAPSULE | Freq: Three times a day (TID) | ORAL | Status: DC
  Administered 2011-07-19 – 2011-08-01 (×34): 100 mg via ORAL
  Filled 2011-07-19 (×45): qty 1

## 2011-07-19 MED ORDER — OSELTAMIVIR PHOSPHATE 75 MG PO CAPS
75.0000 mg | ORAL_CAPSULE | Freq: Two times a day (BID) | ORAL | Status: DC
  Administered 2011-07-19 – 2011-07-21 (×4): 75 mg via ORAL
  Filled 2011-07-19 (×5): qty 1

## 2011-07-19 MED ORDER — IPRATROPIUM BROMIDE 0.02 % IN SOLN
RESPIRATORY_TRACT | Status: AC
  Administered 2011-07-19: 0.5 mg via RESPIRATORY_TRACT
  Filled 2011-07-19: qty 2.5

## 2011-07-19 MED ORDER — ACETAMINOPHEN 325 MG PO TABS
ORAL_TABLET | ORAL | Status: AC
  Filled 2011-07-19: qty 2

## 2011-07-19 MED ORDER — IOHEXOL 300 MG/ML  SOLN
100.0000 mL | Freq: Once | INTRAMUSCULAR | Status: AC | PRN
  Administered 2011-07-19: 60 mL via INTRAVENOUS

## 2011-07-19 MED ORDER — LEVOTHYROXINE SODIUM 100 MCG PO TABS
100.0000 ug | ORAL_TABLET | ORAL | Status: DC
  Administered 2011-07-22 – 2011-07-31 (×5): 100 ug via ORAL
  Filled 2011-07-19 (×5): qty 1

## 2011-07-19 MED ORDER — MECLIZINE HCL 25 MG PO TABS
25.0000 mg | ORAL_TABLET | Freq: Three times a day (TID) | ORAL | Status: DC | PRN
Start: 2011-07-19 — End: 2011-08-01
  Filled 2011-07-19: qty 1

## 2011-07-19 MED ORDER — CEFTRIAXONE SODIUM 1 G IJ SOLR
1.0000 g | Freq: Once | INTRAMUSCULAR | Status: AC
  Administered 2011-07-19: 1 g via INTRAVENOUS
  Filled 2011-07-19: qty 10

## 2011-07-19 MED ORDER — PANTOPRAZOLE SODIUM 40 MG PO TBEC
40.0000 mg | DELAYED_RELEASE_TABLET | Freq: Every day | ORAL | Status: DC
  Administered 2011-07-19 – 2011-08-01 (×14): 40 mg via ORAL
  Filled 2011-07-19 (×15): qty 1

## 2011-07-19 MED ORDER — OXYCODONE HCL 20 MG PO TB12
20.0000 mg | ORAL_TABLET | Freq: Two times a day (BID) | ORAL | Status: DC
  Administered 2011-07-19 – 2011-08-01 (×23): 20 mg via ORAL
  Filled 2011-07-19 (×22): qty 1

## 2011-07-19 MED ORDER — IPRATROPIUM BROMIDE 0.02 % IN SOLN
0.5000 mg | Freq: Four times a day (QID) | RESPIRATORY_TRACT | Status: DC
  Administered 2011-07-19 – 2011-07-20 (×4): 0.5 mg via RESPIRATORY_TRACT
  Filled 2011-07-19 (×3): qty 2.5

## 2011-07-19 MED ORDER — MAGNESIUM CHLORIDE 64 MG PO TBEC
1.0000 | DELAYED_RELEASE_TABLET | Freq: Every day | ORAL | Status: DC
  Administered 2011-07-19 – 2011-08-01 (×14): 64 mg via ORAL
  Filled 2011-07-19 (×16): qty 1

## 2011-07-19 MED ORDER — ONDANSETRON HCL 4 MG PO TABS
4.0000 mg | ORAL_TABLET | Freq: Four times a day (QID) | ORAL | Status: DC | PRN
  Administered 2011-07-27: 4 mg via ORAL
  Filled 2011-07-19: qty 1

## 2011-07-19 MED ORDER — ONDANSETRON HCL 4 MG/2ML IJ SOLN
4.0000 mg | Freq: Four times a day (QID) | INTRAMUSCULAR | Status: DC | PRN
  Administered 2011-07-20 – 2011-07-31 (×9): 4 mg via INTRAVENOUS
  Filled 2011-07-19 (×10): qty 2

## 2011-07-19 MED ORDER — MAGIC MOUTHWASH
5.0000 mL | Freq: Four times a day (QID) | ORAL | Status: DC | PRN
  Administered 2011-07-21 – 2011-07-27 (×2): 5 mL via ORAL
  Filled 2011-07-19 (×2): qty 5

## 2011-07-19 MED ORDER — DOCUSATE SODIUM 100 MG PO CAPS
100.0000 mg | ORAL_CAPSULE | Freq: Two times a day (BID) | ORAL | Status: DC
  Administered 2011-07-19 – 2011-08-01 (×24): 100 mg via ORAL
  Filled 2011-07-19 (×27): qty 1

## 2011-07-19 MED ORDER — DEXTROSE 5 % IV SOLN
500.0000 mg | Freq: Once | INTRAVENOUS | Status: AC
  Administered 2011-07-19: 500 mg via INTRAVENOUS
  Filled 2011-07-19: qty 500

## 2011-07-19 MED ORDER — VANCOMYCIN HCL IN DEXTROSE 1-5 GM/200ML-% IV SOLN
1000.0000 mg | Freq: Two times a day (BID) | INTRAVENOUS | Status: DC
  Administered 2011-07-19 – 2011-07-24 (×10): 1000 mg via INTRAVENOUS
  Filled 2011-07-19 (×12): qty 200

## 2011-07-19 MED ORDER — HYDROCODONE-ACETAMINOPHEN 5-325 MG PO TABS
1.5000 | ORAL_TABLET | Freq: Four times a day (QID) | ORAL | Status: DC | PRN
  Administered 2011-07-29: 1.5 via ORAL
  Filled 2011-07-19: qty 2

## 2011-07-19 MED ORDER — SENNOSIDES-DOCUSATE SODIUM 8.6-50 MG PO TABS
1.0000 | ORAL_TABLET | Freq: Every evening | ORAL | Status: DC | PRN
  Administered 2011-07-19: 1 via ORAL
  Filled 2011-07-19 (×2): qty 1

## 2011-07-19 MED ORDER — ALBUTEROL SULFATE (5 MG/ML) 0.5% IN NEBU: 2.5000 mg | INHALATION_SOLUTION | RESPIRATORY_TRACT | Status: DC | PRN

## 2011-07-19 MED ORDER — ACETAMINOPHEN 650 MG RE SUPP: 650.0000 mg | Freq: Four times a day (QID) | RECTAL | Status: DC | PRN

## 2011-07-19 MED ORDER — ACETAMINOPHEN 325 MG PO TABS
ORAL_TABLET | ORAL | Status: AC
  Administered 2011-07-19: 650 mg via ORAL
  Filled 2011-07-19: qty 2

## 2011-07-19 MED ORDER — SODIUM CHLORIDE 0.9 % IV SOLN
INTRAVENOUS | Status: DC
  Administered 2011-07-19: 23:00:00 via INTRAVENOUS
  Administered 2011-07-20: 1000 mL via INTRAVENOUS
  Administered 2011-07-22 – 2011-07-28 (×4): via INTRAVENOUS

## 2011-07-19 MED ORDER — EMOLLIENT BASE EX CREA
TOPICAL_CREAM | CUTANEOUS | Status: DC | PRN
  Filled 2011-07-19: qty 1

## 2011-07-19 MED ORDER — ALBUTEROL SULFATE (5 MG/ML) 0.5% IN NEBU
2.5000 mg | INHALATION_SOLUTION | Freq: Four times a day (QID) | RESPIRATORY_TRACT | Status: DC
  Administered 2011-07-19 – 2011-07-20 (×4): 2.5 mg via RESPIRATORY_TRACT
  Filled 2011-07-19 (×3): qty 0.5

## 2011-07-19 MED ORDER — LIDOCAINE VISCOUS 2 % MT SOLN
4.0000 mL | Freq: Every day | OROMUCOSAL | Status: DC
  Administered 2011-07-30 – 2011-08-01 (×2): 5 mL via OROMUCOSAL
  Filled 2011-07-19 (×14): qty 5

## 2011-07-19 MED ORDER — PIPERACILLIN-TAZOBACTAM 3.375 G IVPB: 3.3750 g | Freq: Four times a day (QID) | INTRAVENOUS | Status: DC

## 2011-07-19 MED ORDER — ACETAMINOPHEN 325 MG PO TABS
650.0000 mg | ORAL_TABLET | Freq: Four times a day (QID) | ORAL | Status: DC | PRN
  Administered 2011-07-19 – 2011-08-01 (×18): 650 mg via ORAL
  Filled 2011-07-19: qty 2
  Filled 2011-07-19: qty 1
  Filled 2011-07-19 (×18): qty 2

## 2011-07-19 MED ORDER — ALBUTEROL SULFATE (5 MG/ML) 0.5% IN NEBU
INHALATION_SOLUTION | RESPIRATORY_TRACT | Status: AC
  Administered 2011-07-19: 2.5 mg via RESPIRATORY_TRACT
  Filled 2011-07-19: qty 0.5

## 2011-07-19 NOTE — ED Notes (Signed)
C/o headache--rates the pain 6 on 1-10 scale

## 2011-07-19 NOTE — H&P (Signed)
[name] MRN: [id number] DOB/AGE: 60-Sep-1952 60 y.o. Primary Care Physician:[name],[name], PA, PA Admit date: 07/19/2011 Chief Complaint: Generalized weakness, shortness of breath, fatigue. HPI:  [name] is a pleasant 61 year old African American female with history of recurrent multiple myeloma initially diagnosed in October 2008 is status post palliative radiotherapy and on systemic chemotherapy last treatment approximately a week prior to admission, history of hypothyroidism, history of hyperlipidemia, history of GERD who presents to the emergency room with a one to two-day history of worsening fatigue, weakness, shortness of breath on exertion, upper respiratory symptoms, inability to stand due to generalized weakness. Patient also endorses some dizziness when she states that she gets dizzy on sitting and standing and feels like the room is spinning. Patient also does endorse some chills and a nonproductive cough. Patient has had decreased oral intake. Patient also states has had some watery diarrhea which started ever since she started with chemotherapy. Patient also has had a couple of episodes of syncopal attacks 2 weeks prior to admission when she stated that she has bulges generally weak and dizzy and subsequently passed out. Patient denies any chest pain no nausea no vomiting no dysuria does endorse some abdominal cramping prior to a bowel movement which improves after bowel movement. Patient presented to her PCPs office today and was noted to have borderline hypotension with a blood pressure 100/52 and a heart rate of 104. Patient was sent to the ED for further evaluation and management. Patient was seen in the ED chest x-ray which was done was a concern for right paramediastinal density query pneumonia. See med which was done did have a sodium of 133 out for is a 167 albumin of 2.9 AST of 49 otherwise was within normal limits. Her first set of cardiac enzymes were negative. CBC had  a white count of 3.2 hemoglobin of 8.5 and a platelet count of 116. Patient was given some IV Rocephin and azithromycin we were called to admit for further evaluation and management.  Past Medical History  Diagnosis Date  . Cancer multiple myeloma  . Thyroid disease Hypothyroidism  . Hypercholesterolemia   . Compression fracture 04/08/2007    pathologic compression fracture  . Hypothyroidism   . FHx: chemotherapy     s/p 5 cycle revlimid/low dose decadron,s/p velcade,doxil,decadron,  . Hx of radiation therapy 05/05/07-05/18/07,& 03/05/11-03/21/11-    l3&l5 in 2008, t2-t6 03/2011  . GERD (gastroesophageal reflux disease)   . Insomnia     associated with steroids  . Nausea   . Constipation     takes oxycontin,vicodin    Past Surgical History  Procedure Date  . Cholecystectomy   . Knee surgery   . Knee surgery     Prior to Admission medications   Medication Sig Start Date End Date Taking? Authorizing Provider  Alum & Mag Hydroxide-Simeth (MAGIC MOUTHWASH) SOLN Take 5 mLs by mouth as needed.   03/21/11  Yes Historical Provider, MD  calcium carbonate (OS-CAL - DOSED IN MG OF ELEMENTAL CALCIUM) 1250 MG tablet Take 1 tablet by mouth daily.   Yes Historical Provider, MD  cholecalciferol (VITAMIN D) 1000 UNITS tablet Take 1,000 Units by mouth daily.    Yes Historical Provider, MD  dexamethasone (DECADRON) 4 MG tablet Take 4 mg by mouth every Monday.  07/03/11  Yes Historical Provider, MD  emollient (BIAFINE) cream Apply topically as needed.   03/06/11  Yes Historical Provider, MD  eszopiclone (LUNESTA) 1 MG TABS Take 1 mg by mouth at bedtime. Take immediately  before bedtime   Yes Historical Provider, MD  gabapentin (NEURONTIN) 100 MG capsule Take 100 mg by mouth 3 (three) times daily.    Yes Historical Provider, MD  HYDROcodone-acetaminophen (VICODIN ES) 7.5-750 MG per tablet Take 1 tablet by mouth every 6 (six) hours as needed. 07/08/11  Yes Conni Slipper, PA  levothyroxine (SYNTHROID,  LEVOTHROID) 100 MCG tablet Take 50-100 mcg by mouth as directed. 1 tab x 3 days a week Monday,wednesday,friday 1/2 tab all others day   Yes Amy Stevenson, DO  lidocaine (XYLOCAINE) 2 % solution Take 4-5 mLs by mouth daily.  06/26/11  Yes Historical Provider, MD  lidocaine-prilocaine (EMLA) cream Apply topically as needed.     Yes Historical Provider, MD  magnesium chloride (SLOW-MAG) 64 MG TBEC Take 1 tablet by mouth daily.     Yes Historical Provider, MD  Multiple Vitamin (MULITIVITAMIN WITH MINERALS) TABS Take 1 tablet by mouth daily.   Yes Historical Provider, MD  oxyCODONE (OXYCONTIN) 20 MG 12 hr tablet Take 20 mg by mouth every 12 (twelve) hours. Pt now primarily taking 1 tab at night 03/27/11  Yes Historical Provider, MD  pantoprazole (PROTONIX) 40 MG tablet TAKE 1 TABLET BY MOUTH EVERY DAY 05/14/11  Yes Mohamed K. Mohamed, MD  prochlorperazine (COMPAZINE) 10 MG tablet Take 10 mg by mouth every 6 (six) hours as needed. nAUSEA 03/15/11  Yes Oneita Hurt, MD  PRESCRIPTION MEDICATION [facility]/Dr. Arbutus PedBlanch [employer] DVD IV q21d Cycle 3 12/10-12/21. Cycle 4 scheduled for 12/31. Chemo treatments every Monday and Thursday    Historical Provider, MD    Allergies: No Known Allergies  Family History  Problem Relation Age of Onset  . Cancer Brother     lung ca  . Cancer Maternal Uncle     colon  . Cancer Maternal Grandmother     breast ca    Social History:  reports that she has quit smoking. She does not have any smokeless tobacco history on file. Her alcohol and drug histories not on file.  ROS: All systems reviewed with the patient and was positive as per HPI otherwise all other systems are negative.  PHYSICAL EXAM: Blood pressure 105/61, pulse 102, temperature 99.6 F (37.6 C), temperature source Oral, resp. rate 22, SpO2 97.00%. General: Well-developed well-nourished in no acute cardiopulmonary distress. HEENT: Normocephalic atraumatic. Pupils equal round and reactive to light and  accommodation. Extraocular movements intact. Oropharynx is clear, no lesions, no exudates. Neck is supple with no lymphadenopathy. No bruits, no goiter. Dry mucous membranes. Heart: Regular rate and rhythm, without murmurs, rubs, gallops. Lungs: Clear to auscultation bilaterally. Abdomen: Soft, nontender, nondistended, positive bowel sounds. Extremities: No clubbing cyanosis or edema with positive pedal pulses. Neuro: Alert and oriented x3. CN 2 through 12 are grossly intact. Grossly intact, nonfocal.   EKG: EKG shows a normal sinus rhythm with T. wave inversion in leads II, III aVF and V4 through V6 which is similar to prior EKG.  No results found for this or any previous visit (from the past 240 hour(s)).   Lab results:  Basename 07/19/11 1350  NA 133*  K 3.6  CL 100  CO2 23  GLUCOSE 88  BUN 7  CREATININE 0.56  CALCIUM 9.5  MG --  PHOS --    Basename 07/19/11 1350  AST 49*  ALT 33  ALKPHOS 167*  BILITOT 0.7  PROT 6.5  ALBUMIN 2.9*   No results found for this basename: LIPASE:2,AMYLASE:2 in the last 72 hours  Basename  07/19/11 1350  WBC 3.2*  NEUTROABS 2.8  HGB 8.5*  HCT 24.7*  MCV 92.5  PLT 116*    Basename 07/19/11 1350  CKTOTAL 82  CKMB 1.3  CKMBINDEX --  TROPONINI <0.30   No components found with this basename: POCBNP:3 No results found for this basename: DDIMER in the last 72 hours No results found for this basename: HGBA1C:2 in the last 72 hours No results found for this basename: CHOL:2,HDL:2,LDLCALC:2,TRIG:2,CHOLHDL:2,LDLDIRECT:2 in the last 72 hours No results found for this basename: TSH,T4TOTAL,FREET3,T3FREE,THYROIDAB in the last 72 hours No results found for this basename: VITAMINB12:2,FOLATE:2,FERRITIN:2,TIBC:2,IRON:2,RETICCTPCT:2 in the last 72 hours Imaging results:  Dg Chest 2 View  07/19/2011  *RADIOLOGY REPORT*  Clinical Data: Shortness of breath.  Weakness.  Cough.  Myeloma.  CHEST - 2 VIEW  Comparison: 06/26/2011  Findings: Right power  port tip projects over the SVC.  Cardiac and mediastinal contours appear unremarkable.  Mild prominence of the pulmonary vasculature noted.  There is indistinct opacity in the right paramediastinal region which appears new compared to 03/23/2011, suspicious for pneumonia or possibly atelectasis.  IMPRESSION:  1.  Indistinct right paramediastinal density, query pneumonia or atelectasis. 2.  Borderline prominence of the pulmonary vasculature.  Original Report Authenticated By: Dellia Cloud, M.D.   Dg Chest 2 View  06/26/2011  *RADIOLOGY REPORT*  Clinical Data: Weakness.  Dizziness, and sores  CHEST - 2 VIEW  Comparison: 03/23/2011  Findings: Right chest wall porta-catheter is noted with tip in the SVC.  No pleural effusion or pulmonary edema.  No airspace consolidation identified.  Review of the visualized osseous structures is unremarkable.  IMPRESSION:  1.  No active cardiopulmonary abnormalities.  Original Report Authenticated By: Rosealee Albee, M.D.   Impression/Plan:  Principal Problem:  *PNA (pneumonia) Active Problems:  HYPOTHYROIDISM  GERD  Weakness generalized  Dizziness  Dehydration  Pancytopenia  SOB (shortness of breath) on exertion   #1 probable pneumonia Per chest x-ray. Patient has had some upper respiratory symptoms. Will check a sputum Gram stain and culture. We'll check an influenza PCR panel. Due to patient's immunocompromised state we'll place patient on empiric IV Zosyn, IV fluids, nebulizer treatments, Tessalon Perles, supportive care, Tamiflu. #2 dizziness Questionable etiology may be secondary to dehydration versus vertigo. Will check a CT of the head. Will check orthostatics. Hydrate with IV fluids. PT OT vestibular evaluation consult. Meclizine as needed. #3 generalized weakness/shortness of breath Unknown etiology. May be secondary to chemotherapy versus orthostasis versus cardiac etiology versus infectious etiology. We'll cycle cardiac enzymes every 8  hours x3. Will check a d-dimer. Will check a TSH. Will check orthostatics. Check a CT of the head without contrast. Will check an influenza panel PCR. Will check a sputum Gram stain and culture. Will check a 2-D echo. Placed empirically on IV antibiotics, IV fluids, oxygen, nebulizer treatments. #4 dehydration IV fluids #5 pancytopenia Likely secondary to recent chemotherapy versus multiple myeloma. Follow counts. If hemoglobin drops to less than 7 will transfuse. #6 recurrent multiple myeloma Will inform oncology of patient's admission. #7 hypothyroidism Continue home regimen of Synthroid, check a TSH. #8 prophylaxis PPI for GI prophylaxis Lovenox for DVT prophylaxis   [name] 07/19/2011, 4:18 PM

## 2011-07-19 NOTE — Progress Notes (Signed)
ANTIBIOTIC CONSULT NOTE - INITIAL  Pharmacy Consult for Vancomycin Indication: rule out pneumonia  No Known Allergies  Patient Measurements:   Adjusted Body Weight:   Vital Signs: Temp: 99.6 F (37.6 C) (01/18 1445) Temp src: Oral (01/18 1445) BP: 105/61 mmHg (01/18 1534) Pulse Rate: 102  (01/18 1534) Intake/Output from previous day:   Intake/Output from this shift:    Labs:  Basename 07/19/11 1350  WBC 3.2*  HGB 8.5*  PLT 116*  LABCREA --  CREATININE 0.56   The CrCl is unknown because both a height and weight (above a minimum accepted value) are required for this calculation. No results found for this basename: VANCOTROUGH:2,VANCOPEAK:2,VANCORANDOM:2,GENTTROUGH:2,GENTPEAK:2,GENTRANDOM:2,TOBRATROUGH:2,TOBRAPEAK:2,TOBRARND:2,AMIKACINPEAK:2,AMIKACINTROU:2,AMIKACIN:2, in the last 72 hours   Microbiology: Recent Results (from the past 720 hour(s))  TECHNOLOGIST REVIEW     Status: Normal   Collection Time   07/04/11  8:42 AM      Component Value Range Status Comment   Technologist Review Occ Metas and Myelocytes present   Final     Medical History: Past Medical History  Diagnosis Date  . Cancer multiple myeloma  . Thyroid disease Hypothyroidism  . Hypercholesterolemia   . Compression fracture 04/08/2007    pathologic compression fracture  . Hypothyroidism   . FHx: chemotherapy     s/p 5 cycle revlimid/low dose decadron,s/p velcade,doxil,decadron,  . Hx of radiation therapy 05/05/07-05/18/07,& 03/05/11-03/21/11-    l3&l5 in 2008, t2-t6 03/2011  . GERD (gastroesophageal reflux disease)   . Insomnia     associated with steroids  . Nausea   . Constipation     takes oxycontin,vicodin    Medications:  Scheduled:    . azithromycin  500 mg Intravenous Once  . cefTRIAXone (ROCEPHIN)  IV  1 g Intravenous Once  . sodium chloride  1,000 mL Intravenous Once   Infusions:   Assessment:  53 YOF with h/o recurrent mult. myeloma initiated on IV Rocephin and Azithromycin  for r/o PNA now changing to IV Vancomycin per pharmacy.  Wt of 75.8 kg as of 07/08/11, CG CrCl 89 ml/min.    Goal of Therapy:  Vancomycin trough level 15-20 mcg/ml  Plan:   Vancomycin 1g IV q12h, first dose now.  F/u renal fxn, most up-to-date weight and adjust as needed.    Recommend adding gram negative and/or anaerobic coverage with Vancomycin for PNA.  Geoffry Paradise Thi 07/19/2011,7:26 PM

## 2011-07-19 NOTE — ED Notes (Signed)
Per pt, symptoms worsening since beginning chemo in Oct.-lethargy, general weakness-SOB with daily activities

## 2011-07-19 NOTE — ED Notes (Signed)
Report called to [employer], Charity fundraiser

## 2011-07-19 NOTE — ED Provider Notes (Addendum)
History     CSN: [id number]  Arrival date & time 07/19/11  1217   First MD Initiated Contact with Patient 07/19/11 1226      Chief Complaint  Patient presents with  . Shortness of Breath    (Consider location/radiation/quality/duration/timing/severity/associated sxs/prior treatment) HPI.... complains of weakness, shortness of breath, dizziness with standing for 2 days.  Seen by her primary care doctor today who recommended she come to the emergency department. Diagnosed with multiple myeloma in 2008. Currently on chemotherapy. Symptoms are moderate.  No fevers, sweats, chills, stiff neck, dysuria, cough.  He makes symptoms better or worse  Past Medical History  Diagnosis Date  . Cancer multiple myeloma  . Thyroid disease Hypothyroidism  . Hypercholesterolemia   . Compression fracture 04/08/2007    pathologic compression fracture  . Hypothyroidism   . FHx: chemotherapy     s/p 5 cycle revlimid/low dose decadron,s/p velcade,doxil,decadron,  . Hx of radiation therapy 05/05/07-05/18/07,& 03/05/11-03/21/11-    l3&l5 in 2008, t2-t6 03/2011  . GERD (gastroesophageal reflux disease)   . Insomnia     associated with steroids  . Nausea   . Constipation     takes oxycontin,vicodin    Past Surgical History  Procedure Date  . Cholecystectomy   . Knee surgery   . Knee surgery     Family History  Problem Relation Age of Onset  . Cancer Brother     lung ca  . Cancer Maternal Uncle     colon  . Cancer Maternal Grandmother     breast ca    History  Substance Use Topics  . Smoking status: Former Smoker -- 0.2 packs/day for 1 years  . Smokeless tobacco: Not on file  . Alcohol Use:     OB History    Grav Para Term Preterm Abortions TAB SAB Ect Mult Living                  Review of Systems  All other systems reviewed and are negative.    Allergies  Review of patient's allergies indicates no known allergies.  Home Medications   Current Outpatient Rx  Name Route Sig  Dispense Refill  . MAGIC MOUTHWASH Oral Take 5 mLs by mouth as needed.      Marland Kitchen CALCIUM CARBONATE 1250 MG PO TABS Oral Take 1 tablet by mouth daily.    Marland Kitchen VITAMIN D 1000 UNITS PO TABS Oral Take 1,000 Units by mouth daily.     Marland Kitchen DEXAMETHASONE 4 MG PO TABS Oral Take 4 mg by mouth every Monday.     . EMOLLIENT BASE EX CREA Topical Apply topically as needed.      Marland Kitchen ESZOPICLONE 1 MG PO TABS Oral Take 1 mg by mouth at bedtime. Take immediately before bedtime    . GABAPENTIN 100 MG PO CAPS Oral Take 100 mg by mouth 3 (three) times daily.     Marland Kitchen HYDROCODONE-ACETAMINOPHEN 7.5-750 MG PO TABS Oral Take 1 tablet by mouth every 6 (six) hours as needed. 60 tablet 0  . LEVOTHYROXINE SODIUM 100 MCG PO TABS Oral Take 50-100 mcg by mouth as directed. 1 tab x 3 days a week Monday,wednesday,friday 1/2 tab all others day    . LIDOCAINE VISCOUS 2 % MT SOLN Oral Take 4-5 mLs by mouth daily.     Marland Kitchen LIDOCAINE-PRILOCAINE 2.5-2.5 % EX CREA Topical Apply topically as needed.      Marland Kitchen MAGNESIUM CHLORIDE 64 MG PO TBEC Oral Take 1 tablet by mouth daily.      Marland Kitchen  ADULT MULTIVITAMIN W/MINERALS CH Oral Take 1 tablet by mouth daily.    . OXYCODONE HCL ER 20 MG PO TB12 Oral Take 20 mg by mouth every 12 (twelve) hours. Pt now primarily taking 1 tab at night    . PANTOPRAZOLE SODIUM 40 MG PO TBEC  TAKE 1 TABLET BY MOUTH EVERY DAY 30 tablet 1  . PROCHLORPERAZINE MALEATE 10 MG PO TABS Oral Take 10 mg by mouth every 6 (six) hours as needed. nAUSEA    . PRESCRIPTION MEDICATION  [facility]/Dr. Mohamed: Meloma Salvage DVD IV q21d Cycle 3 12/10-12/21. Cycle 4 scheduled for 12/31. Chemo treatments every Monday and Thursday      BP 98/60  Pulse 96  Temp(Src) 98.9 F (37.2 C) (Oral)  Resp 20  SpO2 100%  Physical Exam  Nursing note and vitals reviewed. Constitutional: She is oriented to person, place, and time. She appears well-developed and well-nourished.       Alert, ambulatory  HENT:  Head: Normocephalic and atraumatic.  Eyes: Conjunctivae  and EOM are normal. Pupils are equal, round, and reactive to light.  Neck: Normal range of motion. Neck supple.  Cardiovascular: Normal rate and regular rhythm.   Pulmonary/Chest: Effort normal and breath sounds normal.  Abdominal: Soft. Bowel sounds are normal.  Musculoskeletal: Normal range of motion.  Neurological: She is alert and oriented to person, place, and time.  Skin: Skin is warm and dry.  Psychiatric: She has a normal mood and affect.    ED Course  Procedures (including critical care time)   Labs Reviewed  CBC  DIFFERENTIAL  COMPREHENSIVE METABOLIC PANEL  CARDIAC PANEL(CRET KIN+CKTOT+MB+TROPI)  URINALYSIS, ROUTINE W REFLEX MICROSCOPIC   Dg Chest 2 View  07/19/2011  *RADIOLOGY REPORT*  Clinical Data: Shortness of breath.  Weakness.  Cough.  Myeloma.  CHEST - 2 VIEW  Comparison: 06/26/2011  Findings: Right power port tip projects over the SVC.  Cardiac and mediastinal contours appear unremarkable.  Mild prominence of the pulmonary vasculature noted.  There is indistinct opacity in the right paramediastinal region which appears new compared to 03/23/2011, suspicious for pneumonia or possibly atelectasis.  IMPRESSION:  1.  Indistinct right paramediastinal density, query pneumonia or atelectasis. 2.  Borderline prominence of the pulmonary vasculature.  Original Report Authenticated By: Dellia Cloud, M.D.     No diagnosis found.    MDM  Will do screening tests including blood work, urinnalysis, chest x-ray   1500:  Evaluation of laboratory data shows pancytopenia; hgb dropped approx 2 grams in past month.  Chest x-ray shows worrisome area on the right mediastinum suggestive of pneumonia.  Will hydrate, start antibiotics, discussed with [facility] to admit     Donnetta Hutching, MD 07/19/11 1406  Donnetta Hutching, MD 07/19/11 1511

## 2011-07-20 DIAGNOSIS — I359 Nonrheumatic aortic valve disorder, unspecified: Secondary | ICD-10-CM

## 2011-07-20 LAB — CBC
Hemoglobin: 7.6 g/dL — ABNORMAL LOW (ref 12.0–15.0)
MCH: 32.3 pg (ref 26.0–34.0)
MCV: 92.8 fL (ref 78.0–100.0)
RBC: 2.35 MIL/uL — ABNORMAL LOW (ref 3.87–5.11)

## 2011-07-20 LAB — URINE CULTURE
Culture  Setup Time: 201301191357
Culture: NO GROWTH

## 2011-07-20 LAB — COMPREHENSIVE METABOLIC PANEL
AST: 57 U/L — ABNORMAL HIGH (ref 0–37)
BUN: 4 mg/dL — ABNORMAL LOW (ref 6–23)
CO2: 22 mEq/L (ref 19–32)
Calcium: 8.7 mg/dL (ref 8.4–10.5)
Creatinine, Ser: 0.62 mg/dL (ref 0.50–1.10)
GFR calc Af Amer: >90 mL/min (ref >90)
GFR calc non Af Amer: >90 mL/min (ref >90)

## 2011-07-20 LAB — DIFFERENTIAL
Eosinophils Absolute: 0 10*3/uL (ref 0.0–0.7)
Eosinophils Relative: 1 % (ref 0–5)
Lymphs Abs: 0.2 10*3/uL — ABNORMAL LOW (ref 0.7–4.0)
Monocytes Relative: 7 % (ref 3–12)

## 2011-07-20 LAB — CARDIAC PANEL(CRET KIN+CKTOT+MB+TROPI)
Relative Index: INVALID (ref 0.0–2.5)
Relative Index: INVALID (ref 0.0–2.5)
Total CK: 79 U/L (ref 7–177)
Troponin I: <0.3 ng/mL (ref <0.30)

## 2011-07-20 LAB — CORTISOL: Cortisol, Plasma: 11.1 ug/dL

## 2011-07-20 LAB — INFLUENZA PANEL BY PCR (TYPE A & B)
H1N1 flu by pcr: NOT DETECTED
Influenza A By PCR: NEGATIVE
Influenza B By PCR: NEGATIVE

## 2011-07-20 MED ORDER — IPRATROPIUM BROMIDE 0.02 % IN SOLN
0.5000 mg | Freq: Two times a day (BID) | RESPIRATORY_TRACT | Status: DC
Start: 2011-07-21 — End: 2011-08-01
  Administered 2011-07-21 – 2011-07-31 (×14): 0.5 mg via RESPIRATORY_TRACT
  Filled 2011-07-20 (×23): qty 2.5

## 2011-07-20 MED ORDER — ALBUTEROL SULFATE (5 MG/ML) 0.5% IN NEBU
2.5000 mg | INHALATION_SOLUTION | Freq: Two times a day (BID) | RESPIRATORY_TRACT | Status: DC
  Administered 2011-07-21 – 2011-07-31 (×14): 2.5 mg via RESPIRATORY_TRACT
  Filled 2011-07-20 (×23): qty 0.5

## 2011-07-20 NOTE — Progress Notes (Signed)
  Echocardiogram 2D Echocardiogram has been performed.  [name] 07/20/2011, 9:21 AM

## 2011-07-20 NOTE — Progress Notes (Addendum)
Subjective: Feels better, breathing has improved, denies any further dizziness  Objective: Vital signs in last 24 hours: Temp:  [98 F (36.7 C)-99.8 F (37.7 C)] 98 F (36.7 C) (01/19 1420) Pulse Rate:  [83-102] 85  (01/19 1420) Resp:  [20-22] 20  (01/19 1420) BP: (93-127)/(59-74) 114/66 mmHg (01/19 1420) SpO2:  [95 %-100 %] 100 % (01/19 1420) Weight:  [74.3 kg (163 lb 12.8 oz)-75.297 kg (166 lb)] 74.3 kg (163 lb 12.8 oz) (01/19 1610) Weight change:  Last BM Date: 07/20/11  Intake/Output from previous day: 01/18 0701 - 01/19 0700 In: 1185.4 [I.V.:935.4; IV Piggyback:250] Out: -  Total I/O In: 240 [P.O.:240] Out: 200 [Urine:200]   Physical Exam: General: Alert, awake, oriented x3, in no acute distress. HEENT: No bruits, no goiter. Heart: Regular rate and rhythm, without murmurs, rubs, gallops. Lungs: Bibasilar crackles Abdomen: Soft, nontender, nondistended, positive bowel sounds. Extremities: No clubbing cyanosis or edema with positive pedal pulses. Neuro: Grossly intact, nonfocal.  Lab Results: Basic Metabolic Panel:  Basename 07/20/11 0900 07/19/11 1350  NA 135 133*  K 3.3* 3.6  CL 103 100  CO2 22 23  GLUCOSE 101* 88  BUN 4* 7  CREATININE 0.62 0.56  CALCIUM 8.7 9.5  MG 1.6 --  PHOS -- --   Liver Function Tests:  Basename 07/20/11 0900 07/19/11 1350  AST 57* 49*  ALT 33 33  ALKPHOS 156* 167*  BILITOT 0.5 0.7  PROT 5.7* 6.5  ALBUMIN 2.4* 2.9*   No results found for this basename: LIPASE:2,AMYLASE:2 in the last 72 hours No results found for this basename: AMMONIA:2 in the last 72 hours CBC:  Basename 07/20/11 0900 07/19/11 1350  WBC 2.3* 3.2*  NEUTROABS 1.9 2.8  HGB 7.6* 8.5*  HCT 21.8* 24.7*  MCV 92.8 92.5  PLT 110* 116*   Cardiac Enzymes:  Basename 07/20/11 0900 07/20/11 0045 07/19/11 1350  CKTOTAL 79 84 82  CKMB 1.2 1.3 1.3  CKMBINDEX -- -- --  TROPONINI <0.30 <0.30 <0.30   BNP: No results found for this basename: PROBNP:3 in the last  72 hours D-Dimer:  [facility] 07/19/11 1450  DDIMER 8.51*   CBG:  Basename 07/20/11 1136 07/20/11 0718  GLUCAP 104* 100*   Hemoglobin A1C: No results found for this basename: HGBA1C in the last 72 hours Fasting Lipid Panel: No results found for this basename: CHOL,HDL,LDLCALC,TRIG,CHOLHDL,LDLDIRECT in the last 72 hours Thyroid Function Tests:  Basename 07/20/11 0900  TSH 7.392*  T4TOTAL --  FREET4 --  T3FREE --  THYROIDAB --   Anemia Panel: No results found for this basename: VITAMINB12,FOLATE,FERRITIN,TIBC,IRON,RETICCTPCT in the last 72 hours Coagulation: No results found for this basename: LABPROT:2,INR:2 in the last 72 hours Urine Drug Screen: Drugs of Abuse     Component Value Date/Time   LABOPIA NONE DETECTED 04/11/2009 2129   COCAINSCRNUR NONE DETECTED 04/11/2009 2129   LABBENZ NONE DETECTED 04/11/2009 2129   AMPHETMU NONE DETECTED 04/11/2009 2129   THCU NONE DETECTED 04/11/2009 2129   LABBARB  Value: NONE DETECTED        DRUG SCREEN FOR MEDICAL PURPOSES ONLY.  IF CONFIRMATION IS NEEDED FOR ANY PURPOSE, NOTIFY LAB WITHIN 5 DAYS.        LOWEST DETECTABLE LIMITS FOR URINE DRUG SCREEN Drug Class       Cutoff (ng/mL) Amphetamine      1000 Barbiturate      200 Benzodiazepine   200 Tricyclics       300 Opiates  300 Cocaine          300 THC              50 04/11/2009 2129    Alcohol Level: No results found for this basename: ETH:2 in the last 72 hours  No results found for this or any previous visit (from the past 240 hour(s)).  Studies/Results: Dg Chest 2 View  07/19/2011  *RADIOLOGY REPORT*  Clinical Data: Shortness of breath.  Weakness.  Cough.  Myeloma.  CHEST - 2 VIEW  Comparison: 06/26/2011  Findings: Right power port tip projects over the SVC.  Cardiac and mediastinal contours appear unremarkable.  Mild prominence of the pulmonary vasculature noted.  There is indistinct opacity in the right paramediastinal region which appears new compared to 03/23/2011,  suspicious for pneumonia or possibly atelectasis.  IMPRESSION:  1.  Indistinct right paramediastinal density, query pneumonia or atelectasis. 2.  Borderline prominence of the pulmonary vasculature.  Original Report Authenticated By: Dellia Cloud, M.D.   Ct Head Wo Contrast  07/19/2011  *RADIOLOGY REPORT*  Clinical Data: Dizziness  CT HEAD WITHOUT CONTRAST  Technique:  Contiguous axial images were obtained from the base of the skull through the vertex without contrast.  Comparison: CT brain scan of 04/11/2009  Findings: The ventricular system is stable in size and configuration, and the septum is in a normal midline position.  The fourth ventricle and basilar cisterns appear normal.  No hemorrhage, mass lesion, or acute infarction is seen.  On bone window images, however, there are now calvarial lucencies that are new compared to the prior study.  Is there clinical suspicion of multiple myeloma or metastatic disease?  IMPRESSION:  1.  Negative unenhanced CT of the brain. 2.  However there are new lucencies within the calvarium suspicious for either multiple myeloma or metastatic involvement of the calvarium.  Correlate clinically.  Original Report Authenticated By: Juline Patch, M.D.   Ct Angio Chest W/cm &/or Wo Cm  07/19/2011  *RADIOLOGY REPORT*  Clinical Data: Shortness of breath, elevated D-dimer.  History of multiple myeloma  CT ANGIOGRAPHY CHEST  Technique:  Multidetector CT imaging of the chest using the standard protocol during bolus administration of intravenous contrast. Multiplanar reconstructed images including MIPs were obtained and reviewed to evaluate the vascular anatomy.  Contrast: 60mL OMNIPAQUE IOHEXOL 300 MG/ML IV SOLN  Comparison: Chest radiograph same date, most recent chest CT 12/22/2009  Findings: Right-sided Port-A-Cath is in place with the tip terminating in the low right atrium.  Heart size is normal.  No lymphadenopathy.  Great vessels are normal in caliber, with incidental  note made of a bovine arch configuration, a normal variant.  There is a central bronchial wall thickening as well as right greater than left upper lobe alveolar airspace consolidation, likely accounting for the history of the paratracheal density seen on prior chest radiography.  There is also minimal superior segment right lower lobe airspace consolidation.  Trace right greater than left pleural effusions are noted.  No pneumothorax.  The study is of adequate technical quality for evaluation for pulmonary embolism up to and including the 3rd order pulmonary arteries. No focal filling defect is seen to suggest acute pulmonary embolism.  No pericardial effusion.  No axillary lymphadenopathy.  No new osseous abnormality.  Subtle areas of trabecular rarefaction in the spine could reflect osteoporosis but could also indicate myelomatous involvement.  There are also subtle lytic lesions in the sternum.  Compared to the prior exam, these are new/increased.  IMPRESSION: Multilobar airspace consolidation and trace pleural effusions as above, most compatible with pneumonia.  Myelomatous involvement would be less likely, as would treatment effect. No CT evidence for acute pulmonary embolism.  New/increasing lytic osseous lesions, also compatible with the given history of multiple myeloma.  Original Report Authenticated By: Harrel Lemon, M.D.    Medications: Scheduled Meds:   . acetaminophen      . albuterol  2.5 mg Nebulization Q6H  . azithromycin  500 mg Intravenous Once  . benzonatate  100 mg Oral TID  . cefTRIAXone (ROCEPHIN)  IV  1 g Intravenous Once  . docusate sodium  100 mg Oral BID  . enoxaparin  40 mg Subcutaneous Q24H  . gabapentin  100 mg Oral TID  . ipratropium  0.5 mg Nebulization Q6H  . levothyroxine  100 mcg Oral Q M,W,F  . levothyroxine  50 mcg Oral Q T,Th,S,Su  . lidocaine  4-5 mL Mouth/Throat Daily  . magnesium chloride  1 tablet Oral Daily  . oseltamivir  75 mg Oral BID  . oxyCODONE   20 mg Oral Q12H  . pantoprazole  40 mg Oral Daily  . piperacillin-tazobactam (ZOSYN)  IV  3.375 g Intravenous Q8H  . sodium chloride  1,000 mL Intravenous Once  . vancomycin  1,000 mg Intravenous Q12H  . DISCONTD: levothyroxine  50-100 mcg Oral UD  . DISCONTD: piperacillin-tazobactam (ZOSYN)  IV  3.375 g Intravenous Q6H   Continuous Infusions:   . sodium chloride 1,000 mL (07/20/11 1200)   PRN Meds:.acetaminophen, acetaminophen, albuterol, alum & mag hydroxide-simeth, bisacodyl, emollient, HYDROcodone-acetaminophen, iohexol, ipratropium, lidocaine-prilocaine, magic mouthwash, meclizine, ondansetron (ZOFRAN) IV, ondansetron, prochlorperazine, senna-docusate, zolpidem, DISCONTD: HYDROcodone-acetaminophen  Assessment/Plan: 1. Healthcare associated pneumonia: Continue IV vancomycin, Zosyn Follow up blood cultures, if negative would quickly discontinue her vancomycin. Albuterol nebs and incentive spirometer for pulmonary toilet, sputum cultures Follow up influenza PCR, DC tamiflu if negative 2. Pancytopenia: Secondary to multiple myeloma and recent chemotherapy, if hemoglobin continues to trend down may need a transfusion. 3. HYPOTHYROIDISM: Her TSH is elevated,  will check free T4, could be euthyroid sick syndrome 4. Recurrent multiple myeloma: Last chemotherapy with Velcade, Doxil, Decadron on Thursday Will notify oncology on Monday 5. dizziness on admission suspect this was secondary to transient hypotension, acute illness now resolved, continue to monitor 6. DVT prophylaxis with Lovenox and SCDs, monitor platelets closely   LOS: 1 day   [facility] Pager: [phone] 07/20/2011, 2:28 PM

## 2011-07-21 LAB — BASIC METABOLIC PANEL
BUN: 5 mg/dL — ABNORMAL LOW (ref 6–23)
Chloride: 106 mEq/L (ref 96–112)
Creatinine, Ser: 1.06 mg/dL (ref 0.50–1.10)
GFR calc Af Amer: 65 mL/min — ABNORMAL LOW (ref >90)

## 2011-07-21 LAB — CBC
HCT: 21.2 % — ABNORMAL LOW (ref 36.0–46.0)
MCH: 32.5 pg (ref 26.0–34.0)
MCHC: 34.9 g/dL (ref 30.0–36.0)
MCV: 93 fL (ref 78.0–100.0)
RDW: 14.4 % (ref 11.5–15.5)
WBC: 2.5 10*3/uL — ABNORMAL LOW (ref 4.0–10.5)

## 2011-07-21 LAB — VANCOMYCIN, TROUGH: Vancomycin Tr: 15.6 ug/mL (ref 10.0–20.0)

## 2011-07-21 MED ORDER — POTASSIUM CHLORIDE CRYS ER 20 MEQ PO TBCR
40.0000 meq | EXTENDED_RELEASE_TABLET | ORAL | Status: AC
  Administered 2011-07-21 (×2): 40 meq via ORAL
  Filled 2011-07-21 (×2): qty 2

## 2011-07-21 NOTE — Plan of Care (Signed)
Problem: Phase I Progression Outcomes Goal: Initial discharge plan identified Outcome: Progressing Home with family   Problem: Phase II Progression Outcomes Goal: Progress activity as tolerated unless otherwise ordered Outcome: Progressing Up to br

## 2011-07-21 NOTE — Progress Notes (Signed)
Subjective: Feels better, breathing has improved, states dizziness improved.  Objective: Vital signs in last 24 hours: Temp:  [98 F (36.7 C)-101.6 F (38.7 C)] 98 F (36.7 C) (01/20 1436) Pulse Rate:  [69-150] 93  (01/20 1436) Resp:  [18] 18  (01/20 1436) BP: (102-120)/(32-66) 119/53 mmHg (01/20 1436) SpO2:  [91 %-96 %] 95 % (01/20 1436) Weight:  [74.3 kg (163 lb 12.8 oz)] 74.3 kg (163 lb 12.8 oz) (01/20 0629) Weight change: -0.997 kg (-2 lb 3.2 oz) Last BM Date: 07/20/11  Intake/Output from previous day: 01/19 0701 - 01/20 0700 In: 1659.8 [P.O.:240; I.V.:1137.9; IV Piggyback:281.8] Out: 200 [Urine:200]     Physical Exam: General: Alert, awake, oriented x3, in no acute distress. HEENT: No bruits, no goiter. Heart: Regular rate and rhythm, without murmurs, rubs, gallops. Lungs: Bibasilar crackles Abdomen: Soft, nontender, nondistended, positive bowel sounds. Extremities: No clubbing cyanosis or edema with positive pedal pulses. Neuro: Grossly intact, nonfocal.  Lab Results: Basic Metabolic Panel:  Basename 07/21/11 0500 07/20/11 0900  NA 138 135  K 3.3* 3.3*  CL 106 103  CO2 22 22  GLUCOSE 96 101*  BUN 5* 4*  CREATININE 1.06 0.62  CALCIUM 8.5 8.7  MG 1.5 1.6  PHOS -- --   Liver Function Tests:  Basename 07/20/11 0900 07/19/11 1350  AST 57* 49*  ALT 33 33  ALKPHOS 156* 167*  BILITOT 0.5 0.7  PROT 5.7* 6.5  ALBUMIN 2.4* 2.9*   No results found for this basename: LIPASE:2,AMYLASE:2 in the last 72 hours No results found for this basename: AMMONIA:2 in the last 72 hours CBC:  Basename 07/21/11 0500 07/20/11 0900 07/19/11 1350  WBC 2.5* 2.3* --  NEUTROABS -- 1.9 2.8  HGB 7.4* 7.6* --  HCT 21.2* 21.8* --  MCV 93.0 92.8 --  PLT 122* 110* --   Cardiac Enzymes:  Basename 07/20/11 0900 07/20/11 0045 07/19/11 1350  CKTOTAL 79 84 82  CKMB 1.2 1.3 1.3  CKMBINDEX -- -- --  TROPONINI <0.30 <0.30 <0.30   BNP: No results found for this basename: PROBNP:3  in the last 72 hours D-Dimer:  [facility] 07/19/11 1450  DDIMER 8.51*   CBG:  Basename 07/20/11 1136 07/20/11 0718  GLUCAP 104* 100*   Hemoglobin A1C: No results found for this basename: HGBA1C in the last 72 hours Fasting Lipid Panel: No results found for this basename: CHOL,HDL,LDLCALC,TRIG,CHOLHDL,LDLDIRECT in the last 72 hours Thyroid Function Tests:  Basename 07/20/11 1550 07/20/11 0900  TSH -- 7.392*  T4TOTAL -- --  FREET4 0.90 --  T3FREE -- --  THYROIDAB -- --   Anemia Panel: No results found for this basename: VITAMINB12,FOLATE,FERRITIN,TIBC,IRON,RETICCTPCT in the last 72 hours Coagulation: No results found for this basename: LABPROT:2,INR:2 in the last 72 hours Urine Drug Screen: Drugs of Abuse     Component Value Date/Time   LABOPIA NONE DETECTED 04/11/2009 2129   COCAINSCRNUR NONE DETECTED 04/11/2009 2129   LABBENZ NONE DETECTED 04/11/2009 2129   AMPHETMU NONE DETECTED 04/11/2009 2129   THCU NONE DETECTED 04/11/2009 2129   LABBARB  Value: NONE DETECTED        DRUG SCREEN FOR MEDICAL PURPOSES ONLY.  IF CONFIRMATION IS NEEDED FOR ANY PURPOSE, NOTIFY LAB WITHIN 5 DAYS.        LOWEST DETECTABLE LIMITS FOR URINE DRUG SCREEN Drug Class       Cutoff (ng/mL) Amphetamine      1000 Barbiturate      200 Benzodiazepine   200 Tricyclics  300 Opiates          300 Cocaine          300 THC              50 04/11/2009 2129    Alcohol Level: No results found for this basename: ETH:2 in the last 72 hours  Recent Results (from the past 240 hour(s))  CULTURE, BLOOD (ROUTINE X 2)     Status: Normal (Preliminary result)   Collection Time   07/19/11  7:36 PM      Component Value Range Status Comment   Specimen Description BLOOD LEFT ARM   Final    Special Requests BOTTLES DRAWN AEROBIC AND ANAEROBIC 6CC   Final    Setup Time [id number]   Final    Culture     Final    Value:        BLOOD CULTURE RECEIVED NO GROWTH TO DATE CULTURE WILL BE HELD FOR 5 DAYS BEFORE ISSUING A FINAL  NEGATIVE REPORT   Report Status PENDING   Incomplete   CULTURE, BLOOD (ROUTINE X 2)     Status: Normal (Preliminary result)   Collection Time   07/19/11  7:42 PM      Component Value Range Status Comment   Specimen Description BLOOD LEFT HAND   Final    Special Requests BOTTLES DRAWN AEROBIC AND ANAEROBIC 4CC   Final    Setup Time [id number]   Final    Culture     Final    Value:        BLOOD CULTURE RECEIVED NO GROWTH TO DATE CULTURE WILL BE HELD FOR 5 DAYS BEFORE ISSUING A FINAL NEGATIVE REPORT   Report Status PENDING   Incomplete   URINE CULTURE     Status: Normal   Collection Time   07/20/11  9:35 AM      Component Value Range Status Comment   Specimen Description URINE, CLEAN CATCH   Final    Special Requests NONE   Final    Setup Time [id number]   Final    Colony Count NO GROWTH   Final    Culture NO GROWTH   Final    Report Status 07/21/2011 FINAL   Final     Studies/Results: Ct Head Wo Contrast  07/19/2011  *RADIOLOGY REPORT*  Clinical Data: Dizziness  CT HEAD WITHOUT CONTRAST  Technique:  Contiguous axial images were obtained from the base of the skull through the vertex without contrast.  Comparison: CT brain scan of 04/11/2009  Findings: The ventricular system is stable in size and configuration, and the septum is in a normal midline position.  The fourth ventricle and basilar cisterns appear normal.  No hemorrhage, mass lesion, or acute infarction is seen.  On bone window images, however, there are now calvarial lucencies that are new compared to the prior study.  Is there clinical suspicion of multiple myeloma or metastatic disease?  IMPRESSION:  1.  Negative unenhanced CT of the brain. 2.  However there are new lucencies within the calvarium suspicious for either multiple myeloma or metastatic involvement of the calvarium.  Correlate clinically.  Original Report Authenticated By: Juline Patch, M.D.   Ct Angio Chest W/cm &/or Wo Cm  07/19/2011  *RADIOLOGY REPORT*   Clinical Data: Shortness of breath, elevated D-dimer.  History of multiple myeloma  CT ANGIOGRAPHY CHEST  Technique:  Multidetector CT imaging of the chest using the standard protocol during bolus administration of intravenous contrast. Multiplanar reconstructed images  including MIPs were obtained and reviewed to evaluate the vascular anatomy.  Contrast: 60mL OMNIPAQUE IOHEXOL 300 MG/ML IV SOLN  Comparison: Chest radiograph same date, most recent chest CT 12/22/2009  Findings: Right-sided Port-A-Cath is in place with the tip terminating in the low right atrium.  Heart size is normal.  No lymphadenopathy.  Great vessels are normal in caliber, with incidental note made of a bovine arch configuration, a normal variant.  There is a central bronchial wall thickening as well as right greater than left upper lobe alveolar airspace consolidation, likely accounting for the history of the paratracheal density seen on prior chest radiography.  There is also minimal superior segment right lower lobe airspace consolidation.  Trace right greater than left pleural effusions are noted.  No pneumothorax.  The study is of adequate technical quality for evaluation for pulmonary embolism up to and including the 3rd order pulmonary arteries. No focal filling defect is seen to suggest acute pulmonary embolism.  No pericardial effusion.  No axillary lymphadenopathy.  No new osseous abnormality.  Subtle areas of trabecular rarefaction in the spine could reflect osteoporosis but could also indicate myelomatous involvement.  There are also subtle lytic lesions in the sternum.  Compared to the prior exam, these are new/increased.  IMPRESSION: Multilobar airspace consolidation and trace pleural effusions as above, most compatible with pneumonia.  Myelomatous involvement would be less likely, as would treatment effect. No CT evidence for acute pulmonary embolism.  New/increasing lytic osseous lesions, also compatible with the given history of  multiple myeloma.  Original Report Authenticated By: Harrel Lemon, M.D.    Medications: Scheduled Meds:    . albuterol  2.5 mg Nebulization BID  . benzonatate  100 mg Oral TID  . docusate sodium  100 mg Oral BID  . enoxaparin  40 mg Subcutaneous Q24H  . gabapentin  100 mg Oral TID  . ipratropium  0.5 mg Nebulization BID  . levothyroxine  100 mcg Oral Q M,W,F  . levothyroxine  50 mcg Oral Q T,Th,S,Su  . lidocaine  4-5 mL Mouth/Throat Daily  . magnesium chloride  1 tablet Oral Daily  . oxyCODONE  20 mg Oral Q12H  . pantoprazole  40 mg Oral Daily  . piperacillin-tazobactam (ZOSYN)  IV  3.375 g Intravenous Q8H  . potassium chloride  40 mEq Oral Q4H  . vancomycin  1,000 mg Intravenous Q12H  . DISCONTD: albuterol  2.5 mg Nebulization Q6H  . DISCONTD: ipratropium  0.5 mg Nebulization Q6H  . DISCONTD: oseltamivir  75 mg Oral BID   Continuous Infusions:    . sodium chloride 50 mL/hr at 07/20/11 1443   PRN Meds:.acetaminophen, acetaminophen, albuterol, alum & mag hydroxide-simeth, bisacodyl, emollient, HYDROcodone-acetaminophen, ipratropium, lidocaine-prilocaine, magic mouthwash, meclizine, ondansetron (ZOFRAN) IV, ondansetron, prochlorperazine, senna-docusate, zolpidem  Assessment/Plan: 1. Healthcare associated pneumonia:  Clinical improvement. Patient with fever yesterday. SOB improved.Continue IV vancomycin, Zosyn,Albuterol nebs and incentive spirometer for pulmonary toilet.  sputum cultures and blood cultures pending. Influenza PCR negative. D/C tamiflu. 2. Pancytopenia: Secondary to multiple myeloma and recent chemotherapy, if hemoglobin continues to trend down may need a transfusion. 3. HYPOTHYROIDISM: Her TSH is elevated, free T4 WNL. Continue current synthroid dose f/u as outpatient. 4. Recurrent multiple myeloma: Last chemotherapy with Velcade, Doxil, Decadron on Thursday Will notify oncology on Monday 5. dizziness on admission suspect this was secondary to transient  hypotension, improving, meclizine PRN. PT for vestibular evaluation pending. continue to monitor 6. DVT prophylaxis with Lovenox and SCDs, monitor platelets closely  LOS: 2 days   [facility] Pager: [phone] 07/21/2011, 3:45 PM

## 2011-07-21 NOTE — Progress Notes (Signed)
61 YOF on Vancomycin and Zosyn for r/o PNA  Vancomycin 1/18 >> (1 gm q12h) Zosyn 1/18 >>   Temp: spike in temp - 101 WBC: low secondary to Mult myeloma  Renal: Worse renal fnx today 1.06 < 0.62 (CrCl 57 ml/min) - Normalized = 68 ml/min  Cultures:  1/19 Urine - Pending 1/18 Blood x 2 - Pending Sputum - not collected  A/P:  Vancomycin trough therapeutic today at 15.6 (goal of 15-20 for PNA).  Would continue Vancomycin 1gm IV q12h as previously ordered.    Geoffry Paradise, PharmD.   Pager:  [phone] 8:07 PM

## 2011-07-21 NOTE — Progress Notes (Signed)
Physical Therapy Evaluation Patient Details Name: [name] MRN: [id number] DOB: Apr 23, 1951 Today's Date: 07/21/2011 15:46-16:08, EV2  Problem List:  Patient Active Problem List  Diagnoses  . MULTIPLE  MYELOMA  . HYPOTHYROIDISM  . GERD  . HEMORRHAGE OF RECTUM AND ANUS  . SCIATICA  . FATIGUE  . SHORTNESS OF BREATH  . CHEST PAIN  . CHEST DISCOMFORT  . HYPERTENSION, HX OF  . Weakness generalized  . Dizziness  . PNA (pneumonia)  . Dehydration  . Pancytopenia  . SOB (shortness of breath) on exertion  . Hypokalemia    Past Medical History:  Past Medical History  Diagnosis Date  . Cancer multiple myeloma  . Thyroid disease Hypothyroidism  . Hypercholesterolemia   . Compression fracture 04/08/2007    pathologic compression fracture  . Hypothyroidism   . FHx: chemotherapy     s/p 5 cycle revlimid/low dose decadron,s/p velcade,doxil,decadron,  . Hx of radiation therapy 05/05/07-05/18/07,& 03/05/11-03/21/11-    l3&l5 in 2008, t2-t6 03/2011  . GERD (gastroesophageal reflux disease)   . Insomnia     associated with steroids  . Nausea   . Constipation     takes oxycontin,vicodin   Past Surgical History:  Past Surgical History  Procedure Date  . Cholecystectomy   . Knee surgery   . Knee surgery     PT Assessment/Plan/Recommendation PT Assessment Clinical Impression Statement: 61 y/o BF with hx of multiple myeloma admitted with syncopal episodes and diagnosed with PNA.  Pt moves well, but has poor muscular endurance and activity tolerance.  Will follow acutely and recommend HHPT for continued strengthening. PT Recommendation/Assessment: Patient will need skilled PT in the acute care venue PT Problem List: Decreased strength;Decreased activity tolerance;Decreased mobility Barriers to Discharge: Inaccessible home environment Barriers to Discharge Comments: Bedroom and full bath on 2nd floor.  husband works nights. PT Therapy Diagnosis : Generalized weakness PT  Plan PT Frequency: Min 3X/week PT Treatment/Interventions: Gait training;Stair training;Functional mobility training;Therapeutic activities;Therapeutic exercise;Balance training PT Recommendation Follow Up Recommendations: Home health PT Equipment Recommended: None recommended by PT PT Goals  Acute Rehab PT Goals PT Goal Formulation: With patient Time For Goal Achievement: 2 weeks Pt will go Sit to Stand: Independently PT Goal: Sit to Stand - Progress: Goal set today Pt will go Stand to Sit: Independently PT Goal: Stand to Sit - Progress: Goal set today Pt will Ambulate: 51 - 150 feet;with modified independence PT Goal: Ambulate - Progress: Goal set today Pt will Go Up / Down Stairs: Flight;with modified independence;with rail(s) PT Goal: Up/Down Stairs - Progress: Goal set today Pt will Perform Home Exercise Program: with supervision, verbal cues required/provided PT Goal: Perform Home Exercise Program - Progress: Goal set today  PT Evaluation    Prior Functioning  Home Living Lives With: Spouse Receives Help From: Family;Friend(s) Type of Home: House Home Layout: Two level;1/2 bath on main level;Bed/bath upstairs Alternate Level Stairs-Rails: Right Alternate Level Stairs-Number of Steps: 1 flight with landing in the middle Bathroom Shower/Tub: Engineer, manufacturing systems: Standard Home Adaptive Equipment: None Prior Function Level of Independence: Independent with homemaking with ambulation;Independent with gait (reports she had trouble with stairs.took frequent rest break) Cognition Cognition Arousal/Alertness: Awake/alert Overall Cognitive Status: Appears within functional limits for tasks assessed Sensation/Coordination Sensation Light Touch: Appears Intact Coordination Gross Motor Movements are Fluid and Coordinated: Yes Extremity Assessment RLE Assessment RLE Assessment: Within Functional Limits (poor muscular endurancce) LLE Assessment LLE Assessment:  Within Functional Limits (poor muscular endurance) Mobility (including Balance) Bed Mobility  Bed Mobility: Yes Supine to Sit: 6: Modified independent (Device/Increase time) Transfers Transfers: Yes Sit to Stand: 6: Modified independent (Device/Increase time);From bed;From toilet Sit to Stand Details (indicate cue type and reason): Orthostatic vitals supine 107/61, sitting 116/73, standing 107/67.  Pt with c/o minimal dizziness. Stand to Sit: To bed;To toilet;6: Modified independent (Device/Increase time) Ambulation/Gait Ambulation/Gait: Yes Ambulation/Gait Assistance: 5: Supervision Ambulation/Gait Assistance Details (indicate cue type and reason): Pt ambulated to bathroom and used toilet.  Pt very fatigued after using toilet and had to sit down on window bench for a few minutes before returning to bed. Ambulation Distance (Feet): 20 Feet Assistive device: Other (Comment) (pushing IV pole) Gait Pattern: Within Functional Limits Gait velocity: slow and guarded  Posture/Postural Control Posture/Postural Control: No significant limitations    End of Session PT - End of Session Activity Tolerance: Patient limited by fatigue Patient left: in bed;with call bell in reach;with family/visitor present Nurse Communication: Mobility status for transfers;Mobility status for ambulation General Behavior During Session: [facility] for tasks performed Cognition: [facility] for tasks performed  [facility] [place]UB[place]K 07/21/2011, 4:35 PM

## 2011-07-22 LAB — CBC
HCT: 22.4 % — ABNORMAL LOW (ref 36.0–46.0)
HCT: 26.5 % — ABNORMAL LOW (ref 36.0–46.0)
Hemoglobin: 9.4 g/dL — ABNORMAL LOW (ref 12.0–15.0)
MCHC: 35.5 g/dL (ref 30.0–36.0)
Platelets: 122 10*3/uL — ABNORMAL LOW (ref 150–400)
RBC: 2.96 MIL/uL — ABNORMAL LOW (ref 3.87–5.11)
RDW: 14.4 % (ref 11.5–15.5)
WBC: 3.4 10*3/uL — ABNORMAL LOW (ref 4.0–10.5)

## 2011-07-22 LAB — TYPE AND SCREEN
ABO/RH(D): O POS
Antibody Screen: NEGATIVE

## 2011-07-22 LAB — URINALYSIS, ROUTINE W REFLEX MICROSCOPIC
Bilirubin Urine: NEGATIVE
Ketones, ur: NEGATIVE mg/dL
Nitrite: NEGATIVE
Urobilinogen, UA: 0.2 mg/dL (ref 0.0–1.0)
pH: 6 (ref 5.0–8.0)

## 2011-07-22 LAB — BASIC METABOLIC PANEL
Chloride: 105 mEq/L (ref 96–112)
GFR calc Af Amer: 69 mL/min — ABNORMAL LOW (ref >90)
Potassium: 4 mEq/L (ref 3.5–5.1)

## 2011-07-22 LAB — MAGNESIUM: Magnesium: 1.6 mg/dL (ref 1.5–2.5)

## 2011-07-22 MED ORDER — LEVOFLOXACIN IN D5W 750 MG/150ML IV SOLN
750.0000 mg | INTRAVENOUS | Status: DC
  Administered 2011-07-22 – 2011-07-25 (×4): 750 mg via INTRAVENOUS
  Filled 2011-07-22 (×4): qty 150

## 2011-07-22 MED ORDER — ACETAMINOPHEN 500 MG PO TABS
1000.0000 mg | ORAL_TABLET | Freq: Once | ORAL | Status: AC
  Administered 2011-07-22: 1000 mg via ORAL
  Filled 2011-07-22: qty 2

## 2011-07-22 MED ORDER — DIPHENHYDRAMINE HCL 25 MG PO CAPS
25.0000 mg | ORAL_CAPSULE | Freq: Once | ORAL | Status: AC
  Administered 2011-07-22: 25 mg via ORAL
  Filled 2011-07-22: qty 1

## 2011-07-22 MED ORDER — FUROSEMIDE 10 MG/ML IJ SOLN
20.0000 mg | Freq: Once | INTRAMUSCULAR | Status: AC
  Administered 2011-07-22: 20 mg via INTRAVENOUS
  Filled 2011-07-22: qty 2

## 2011-07-22 NOTE — Progress Notes (Signed)
ADMITTED W/GEN'L WEAKNESS,DIZZINESS.HX: MULTIPLE MYELOMA.FROM HOME W/SPOUSE. ONCOLOGY FOLLOWING.

## 2011-07-22 NOTE — Progress Notes (Signed)
Subjective: Feels better, breathing has improved, states dizziness improved.  Objective: Vital signs in last 24 hours: Temp:  [97.8 F (36.6 C)-102.8 F (39.3 C)] 97.8 F (36.6 C) (01/21 1523) Pulse Rate:  [78-104] 79  (01/21 1523) Resp:  [18-20] 20  (01/21 1523) BP: (91-123)/(55-72) 111/72 mmHg (01/21 1523) SpO2:  [90 %-97 %] 91 % (01/21 0916) Weight:  [74.8 kg (164 lb 14.5 oz)] 74.8 kg (164 lb 14.5 oz) (01/21 0626) Weight change: 0.5 kg (1 lb 1.6 oz) Last BM Date: 07/21/11  Intake/Output from previous day: 01/20 0701 - 01/21 0700 In: 2400 [P.O.:2400] Out: -  Total I/O In: 321.7 [I.V.:50; Blood:271.7] Out: 325 [Urine:325]   Physical Exam: General: Alert, awake, oriented x3, in no acute distress. HEENT: No bruits, no goiter. Heart: Regular rate and rhythm, without murmurs, rubs, gallops. Lungs: Bibasilar crackles Abdomen: Soft, nontender, nondistended, positive bowel sounds. Extremities: No clubbing cyanosis or edema with positive pedal pulses.  Lab Results: Basic Metabolic Panel:  Basename 07/22/11 0400 07/21/11 0500  NA 137 138  K 4.0 3.3*  CL 105 106  CO2 23 22  GLUCOSE 88 96  BUN 4* 5*  CREATININE 1.01 1.06  CALCIUM 8.8 8.5  MG 1.6 1.5  PHOS -- --   Liver Function Tests:  Basename 07/20/11 0900  AST 57*  ALT 33  ALKPHOS 156*  BILITOT 0.5  PROT 5.7*  ALBUMIN 2.4*   No results found for this basename: LIPASE:2,AMYLASE:2 in the last 72 hours No results found for this basename: AMMONIA:2 in the last 72 hours CBC:  Basename 07/22/11 0400 07/21/11 0500 07/20/11 0900  WBC 3.4* 2.5* --  NEUTROABS -- -- 1.9  HGB 7.6* 7.4* --  HCT 22.4* 21.2* --  MCV 93.7 93.0 --  PLT 122* 122* --   Cardiac Enzymes:  Basename 07/20/11 0900 07/20/11 0045  CKTOTAL 79 84  CKMB 1.2 1.3  CKMBINDEX -- --  TROPONINI <0.30 <0.30   BNP: No results found for this basename: PROBNP:3 in the last 72 hours D-Dimer: No results found for this basename: DDIMER:2 in the last  72 hours CBG:  Basename 07/20/11 1136 07/20/11 0718  GLUCAP 104* 100*   Hemoglobin A1C: No results found for this basename: HGBA1C in the last 72 hours Fasting Lipid Panel: No results found for this basename: CHOL,HDL,LDLCALC,TRIG,CHOLHDL,LDLDIRECT in the last 72 hours Thyroid Function Tests:  Basename 07/20/11 1550 07/20/11 0900  TSH -- 7.392*  T4TOTAL -- --  FREET4 0.90 --  T3FREE -- --  THYROIDAB -- --   Anemia Panel: No results found for this basename: VITAMINB12,FOLATE,FERRITIN,TIBC,IRON,RETICCTPCT in the last 72 hours Coagulation: No results found for this basename: LABPROT:2,INR:2 in the last 72 hours Urine Drug Screen: Drugs of Abuse     Component Value Date/Time   LABOPIA NONE DETECTED 04/11/2009 2129   COCAINSCRNUR NONE DETECTED 04/11/2009 2129   LABBENZ NONE DETECTED 04/11/2009 2129   AMPHETMU NONE DETECTED 04/11/2009 2129   THCU NONE DETECTED 04/11/2009 2129   LABBARB  Value: NONE DETECTED        DRUG SCREEN FOR MEDICAL PURPOSES ONLY.  IF CONFIRMATION IS NEEDED FOR ANY PURPOSE, NOTIFY LAB WITHIN 5 DAYS.        LOWEST DETECTABLE LIMITS FOR URINE DRUG SCREEN Drug Class       Cutoff (ng/mL) Amphetamine      1000 Barbiturate      200 Benzodiazepine   200 Tricyclics       300 Opiates  300 Cocaine          300 THC              50 04/11/2009 2129    Alcohol Level: No results found for this basename: ETH:2 in the last 72 hours  Recent Results (from the past 240 hour(s))  CULTURE, BLOOD (ROUTINE X 2)     Status: Normal (Preliminary result)   Collection Time   07/19/11  7:36 PM      Component Value Range Status Comment   Specimen Description BLOOD LEFT ARM   Final    Special Requests BOTTLES DRAWN AEROBIC AND ANAEROBIC 6CC   Final    Setup Time [id number]   Final    Culture     Final    Value:        BLOOD CULTURE RECEIVED NO GROWTH TO DATE CULTURE WILL BE HELD FOR 5 DAYS BEFORE ISSUING A FINAL NEGATIVE REPORT   Report Status PENDING   Incomplete   CULTURE,  BLOOD (ROUTINE X 2)     Status: Normal (Preliminary result)   Collection Time   07/19/11  7:42 PM      Component Value Range Status Comment   Specimen Description BLOOD LEFT HAND   Final    Special Requests BOTTLES DRAWN AEROBIC AND ANAEROBIC 4CC   Final    Setup Time [id number]   Final    Culture     Final    Value:        BLOOD CULTURE RECEIVED NO GROWTH TO DATE CULTURE WILL BE HELD FOR 5 DAYS BEFORE ISSUING A FINAL NEGATIVE REPORT   Report Status PENDING   Incomplete   URINE CULTURE     Status: Normal   Collection Time   07/20/11  9:35 AM      Component Value Range Status Comment   Specimen Description URINE, CLEAN CATCH   Final    Special Requests NONE   Final    Setup Time [id number]   Final    Colony Count NO GROWTH   Final    Culture NO GROWTH   Final    Report Status 07/21/2011 FINAL   Final     Studies/Results: No results found.  Medications: Scheduled Meds:    . acetaminophen  1,000 mg Oral Once  . albuterol  2.5 mg Nebulization BID  . benzonatate  100 mg Oral TID  . diphenhydrAMINE  25 mg Oral Once  . docusate sodium  100 mg Oral BID  . enoxaparin  40 mg Subcutaneous Q24H  . furosemide  20 mg Intravenous Once  . gabapentin  100 mg Oral TID  . ipratropium  0.5 mg Nebulization BID  . levofloxacin (LEVAQUIN) IV  750 mg Intravenous Q24H  . levothyroxine  100 mcg Oral Q M,W,F  . levothyroxine  50 mcg Oral Q T,Th,S,Su  . lidocaine  4-5 mL Mouth/Throat Daily  . magnesium chloride  1 tablet Oral Daily  . oxyCODONE  20 mg Oral Q12H  . pantoprazole  40 mg Oral Daily  . piperacillin-tazobactam (ZOSYN)  IV  3.375 g Intravenous Q8H  . vancomycin  1,000 mg Intravenous Q12H   Continuous Infusions:    . sodium chloride 50 mL/hr at 07/20/11 1443   PRN Meds:.acetaminophen, acetaminophen, albuterol, alum & mag hydroxide-simeth, bisacodyl, emollient, HYDROcodone-acetaminophen, ipratropium, lidocaine-prilocaine, magic mouthwash, meclizine, ondansetron (ZOFRAN) IV,  ondansetron, prochlorperazine, senna-docusate, zolpidem  Assessment/Plan: 1. Healthcare associated pneumonia:  Clinical improvement. Patient with fever yesterday again to 102..Blood cultures with no  growth to date. SOB improved.Continue IV vancomycin, Zosyn,Albuterol nebs and incentive spirometer for pulmonary toilet.  sputum cultures and blood cultures pending. Influenza PCR negative. Will add levaquin to coverage. Check a urine legionella and pneumococcus antigen. 2. Pancytopenia: Secondary to multiple myeloma and recent chemotherapy. Patient with symptomatic anemia and will transfuse 2 units. Follow. Oncology consulted. 3. HYPOTHYROIDISM: Her TSH is elevated, free T4 WNL. Continue current synthroid dose f/u as outpatient. 4. Recurrent multiple myeloma: Last chemotherapy with Velcade, Doxil, Decadron on Thursday. Oncology consult pending.  5. Symtomatic anemia Likely secondary to chemo. Transfuse 2 units PRBCs. Follow h/h. 6. dizziness on admission suspect this was secondary to transient hypotension, improving, meclizine PRN. PT for vestibular evaluation pending. continue to monitor 7. DVT prophylaxis with Lovenox and SCDs, monitor platelets closely   LOS: 3 days   [facility] Pager: [phone] 07/22/2011, 3:39 PM

## 2011-07-22 NOTE — Progress Notes (Signed)
OT Cancellation Note  ___Treatment cancelled today due to medical issues with patient which prohibited therapy  ___ Treatment cancelled today due to patient receiving procedure or test   ___ Treatment cancelled today due to patient's refusal to participate   x___ Treatment cancelled today due to Hgb 7.6.  Spoke to RN:  Pt to get blood.  Will check back.    [place], [place] [phone] 07/22/2011

## 2011-07-22 NOTE — Progress Notes (Signed)
Physical Therapy Treatment Patient Details Name: NEVA RAMASWAMY MRN: [id number] DOB: 02/02/1951 Today's Date: 07/22/2011 Time: [id number] Charge: Leonia Reeves PT Assessment/Plan  PT - Assessment/Plan Comments on Treatment Session: Pt reports dizziness upon standing but states it has been getting better since admission.  Pt on 2nd unit of PRBCs but agreeable to therapy.  Pt educated to focus on a single unmoving object with ambulation to decrease dizziness which pt reported helped resolve her dizziness.  OT to possibly perform vestibular eval tomorrow.  Deferred performing today by PT 2* pt still receiving blood. PT Plan: Discharge plan remains appropriate;Frequency remains appropriate Follow Up Recommendations: Home health PT Equipment Recommended: None recommended by PT PT Goals  Acute Rehab PT Goals PT Goal: Sit to Stand - Progress: Progressing toward goal PT Goal: Stand to Sit - Progress: Progressing toward goal PT Goal: Ambulate - Progress: Progressing toward goal  PT Treatment Precautions/Restrictions  Precautions Precautions: Fall Restrictions Weight Bearing Restrictions: No Mobility (including Balance) Bed Mobility Bed Mobility: Yes Supine to Sit: 6: Modified independent (Device/Increase time) Transfers Transfers: Yes Sit to Stand: 6: Modified independent (Device/Increase time);From bed;From toilet Stand to Sit: 6: Modified independent (Device/Increase time);To bed;To toilet Ambulation/Gait Ambulation/Gait: Yes Ambulation/Gait Assistance: 5: Supervision Ambulation/Gait Assistance Details (indicate cue type and reason): pt reported dizziness when in hallway and educated to focus on an unmoving object, pt reported this helped and then dizziness resolved. Ambulation Distance (Feet): 180 Feet Assistive device: Other (Comment) (pushing IV pole) Gait Pattern: Within Functional Limits Gait velocity: slow, cautious    Exercise    End of Session PT - End of Session Activity  Tolerance: Patient tolerated treatment well Patient left: in bed;with call bell in reach;with family/visitor present General Behavior During Session: [facility] for tasks performed Cognition: [facility] for tasks performed  Daffney Greenly,KATHrine E 07/22/2011, 3:46 PM Pager: [phone]

## 2011-07-23 ENCOUNTER — Other Ambulatory Visit: Payer: Self-pay | Admitting: Physician Assistant

## 2011-07-23 DIAGNOSIS — C9000 Multiple myeloma not having achieved remission: Secondary | ICD-10-CM

## 2011-07-23 DIAGNOSIS — D61818 Other pancytopenia: Secondary | ICD-10-CM

## 2011-07-23 LAB — BASIC METABOLIC PANEL
CO2: 23 mEq/L (ref 19–32)
Calcium: 9 mg/dL (ref 8.4–10.5)
Chloride: 100 mEq/L (ref 96–112)
GFR calc Af Amer: 66 mL/min — ABNORMAL LOW (ref >90)
Sodium: 132 mEq/L — ABNORMAL LOW (ref 135–145)

## 2011-07-23 LAB — EXPECTORATED SPUTUM ASSESSMENT W GRAM STAIN, RFLX TO RESP C

## 2011-07-23 LAB — CBC
HCT: 27.9 % — ABNORMAL LOW (ref 36.0–46.0)
MCV: 89.4 fL (ref 78.0–100.0)
Platelets: 136 10*3/uL — ABNORMAL LOW (ref 150–400)
RBC: 3.12 MIL/uL — ABNORMAL LOW (ref 3.87–5.11)
WBC: 3.3 10*3/uL — ABNORMAL LOW (ref 4.0–10.5)

## 2011-07-23 LAB — URINE CULTURE
Colony Count: NO GROWTH
Culture  Setup Time: 201301221431
Culture: NO GROWTH

## 2011-07-23 LAB — STREP PNEUMONIAE URINARY ANTIGEN: Strep Pneumo Urinary Antigen: NEGATIVE

## 2011-07-23 LAB — CULTURE, RESPIRATORY W GRAM STAIN

## 2011-07-23 MED ORDER — IBUPROFEN 800 MG PO TABS
400.0000 mg | ORAL_TABLET | Freq: Four times a day (QID) | ORAL | Status: DC | PRN
  Administered 2011-07-23 – 2011-07-25 (×3): 400 mg via ORAL
  Filled 2011-07-23 (×3): qty 1

## 2011-07-23 MED ORDER — MAGNESIUM SULFATE 50 % IJ SOLN
Freq: Once | INTRAVENOUS | Status: AC
  Administered 2011-07-23: 22:00:00 via INTRAVENOUS
  Filled 2011-07-23: qty 100

## 2011-07-23 MED ORDER — GUAIFENESIN 100 MG/5ML PO SOLN
5.0000 mL | ORAL | Status: DC | PRN
  Administered 2011-07-23: 22:00:00 via ORAL
  Administered 2011-07-24: 100 mg via ORAL
  Filled 2011-07-23 (×2): qty 10

## 2011-07-23 MED ORDER — ALPRAZOLAM 0.25 MG PO TABS
0.2500 mg | ORAL_TABLET | Freq: Three times a day (TID) | ORAL | Status: DC | PRN
  Administered 2011-07-23 – 2011-08-01 (×7): 0.25 mg via ORAL
  Filled 2011-07-23 (×7): qty 1

## 2011-07-23 MED ORDER — MAGNESIUM SULFATE 50 % IJ SOLN: 3.0000 g | Freq: Once | INTRAVENOUS | Status: DC

## 2011-07-23 NOTE — Progress Notes (Signed)
OT Cancel Note:  Pt has a temp 102.3 per RN.  Will ask PT to do vestibular adaptation exercise tomorrow during their session (x1).  [place], [place] [phone] 07/23/2011

## 2011-07-23 NOTE — Progress Notes (Signed)
Spoke to patient about PT/OT-recommendations for [facility]. Her plans are to return home w/family.Provided her w/HHC provider list,& my contact tel#.

## 2011-07-23 NOTE — Progress Notes (Signed)
Subjective: Patient with fevers overnight and today, with some chills. SOB improving and dizziness improving.  Objective: Vital signs in last 24 hours: Temp:  [98.6 F (37 C)-103.1 F (39.5 C)] 102.3 F (39.1 C) (01/22 1523) Pulse Rate:  [88-108] 88  (01/22 1523) Resp:  [18-20] 18  (01/22 1523) BP: (101-129)/(65-70) 129/70 mmHg (01/22 1523) SpO2:  [93 %-98 %] 97 % (01/22 1523) Weight:  [79.1 kg (174 lb 6.1 oz)] 79.1 kg (174 lb 6.1 oz) (01/22 0500) Weight change: 4.3 kg (9 lb 7.7 oz) Last BM Date: 07/22/11  Intake/Output from previous day: 01/21 0701 - 01/22 0700 In: 5077.5 [P.O.:240; I.V.:3215.8; Blood:271.7; IV Piggyback:1350] Out: 325 [Urine:325] Total I/O In: 480 [P.O.:480] Out: 700 [Urine:700]   Physical Exam: General: Alert, awake, oriented x3, in no acute distress. Heart: Regular rate and rhythm, without murmurs, rubs, gallops. Lungs: Bibasilar crackles Abdomen: Soft, nontender, nondistended, positive bowel sounds. Extremities: No clubbing cyanosis or edema with positive pedal pulses.  Lab Results: Basic Metabolic Panel:  Basename 07/23/11 0520 07/22/11 0400 07/21/11 0500  NA 132* 137 --  K 3.5 4.0 --  CL 100 105 --  CO2 23 23 --  GLUCOSE 87 88 --  BUN 5* 4* --  CREATININE 1.05 1.01 --  CALCIUM 9.0 8.8 --  MG -- 1.6 1.5  PHOS -- -- --   Liver Function Tests: No results found for this basename: AST:2,ALT:2,ALKPHOS:2,BILITOT:2,PROT:2,ALBUMIN:2 in the last 72 hours No results found for this basename: LIPASE:2,AMYLASE:2 in the last 72 hours No results found for this basename: AMMONIA:2 in the last 72 hours CBC:  Basename 07/23/11 0520 07/22/11 2215  WBC 3.3* 3.7*  NEUTROABS -- --  HGB 9.7* 9.4*  HCT 27.9* 26.5*  MCV 89.4 89.5  PLT 136* 120*   Cardiac Enzymes: No results found for this basename: CKTOTAL:3,CKMB:3,CKMBINDEX:3,TROPONINI:3 in the last 72 hours BNP: No results found for this basename: PROBNP:3 in the last 72 hours D-Dimer: No results  found for this basename: DDIMER:2 in the last 72 hours CBG: No results found for this basename: GLUCAP:6 in the last 72 hours Hemoglobin A1C: No results found for this basename: HGBA1C in the last 72 hours Fasting Lipid Panel: No results found for this basename: CHOL,HDL,LDLCALC,TRIG,CHOLHDL,LDLDIRECT in the last 72 hours Thyroid Function Tests: No results found for this basename: TSH,T4TOTAL,FREET4,T3FREE,THYROIDAB in the last 72 hours Anemia Panel: No results found for this basename: VITAMINB12,FOLATE,FERRITIN,TIBC,IRON,RETICCTPCT in the last 72 hours Coagulation: No results found for this basename: LABPROT:2,INR:2 in the last 72 hours Urine Drug Screen: Drugs of Abuse     Component Value Date/Time   LABOPIA NONE DETECTED 04/11/2009 2129   COCAINSCRNUR NONE DETECTED 04/11/2009 2129   LABBENZ NONE DETECTED 04/11/2009 2129   AMPHETMU NONE DETECTED 04/11/2009 2129   THCU NONE DETECTED 04/11/2009 2129   LABBARB  Value: NONE DETECTED        DRUG SCREEN FOR MEDICAL PURPOSES ONLY.  IF CONFIRMATION IS NEEDED FOR ANY PURPOSE, NOTIFY LAB WITHIN 5 DAYS.        LOWEST DETECTABLE LIMITS FOR URINE DRUG SCREEN Drug Class       Cutoff (ng/mL) Amphetamine      1000 Barbiturate      200 Benzodiazepine   200 Tricyclics       300 Opiates          300 Cocaine          300 THC              50 04/11/2009 2129  Alcohol Level: No results found for this basename: ETH:2 in the last 72 hours  Recent Results (from the past 240 hour(s))  CULTURE, BLOOD (ROUTINE X 2)     Status: Normal (Preliminary result)   Collection Time   07/19/11  7:36 PM      Component Value Range Status Comment   Specimen Description BLOOD LEFT ARM   Final    Special Requests BOTTLES DRAWN AEROBIC AND ANAEROBIC 6CC   Final    Setup Time [id number]   Final    Culture     Final    Value:        BLOOD CULTURE RECEIVED NO GROWTH TO DATE CULTURE WILL BE HELD FOR 5 DAYS BEFORE ISSUING A FINAL NEGATIVE REPORT   Report Status PENDING    Incomplete   CULTURE, BLOOD (ROUTINE X 2)     Status: Normal (Preliminary result)   Collection Time   07/19/11  7:42 PM      Component Value Range Status Comment   Specimen Description BLOOD LEFT HAND   Final    Special Requests BOTTLES DRAWN AEROBIC AND ANAEROBIC 4CC   Final    Setup Time [id number]   Final    Culture     Final    Value:        BLOOD CULTURE RECEIVED NO GROWTH TO DATE CULTURE WILL BE HELD FOR 5 DAYS BEFORE ISSUING A FINAL NEGATIVE REPORT   Report Status PENDING   Incomplete   URINE CULTURE     Status: Normal   Collection Time   07/20/11  9:35 AM      Component Value Range Status Comment   Specimen Description URINE, CLEAN CATCH   Final    Special Requests NONE   Final    Setup Time [id number]   Final    Colony Count NO GROWTH   Final    Culture NO GROWTH   Final    Report Status 07/21/2011 FINAL   Final   CULTURE, SPUTUM-ASSESSMENT     Status: Normal   Collection Time   07/23/11 11:24 AM      Component Value Range Status Comment   Specimen Description SPUTUM   Final    Special Requests NONE   Final    Sputum evaluation     Final    Value: THIS SPECIMEN IS ACCEPTABLE. RESPIRATORY CULTURE REPORT TO FOLLOW.   Report Status 07/23/2011 FINAL   Final     Studies/Results: No results found.  Medications: Scheduled Meds:    . albuterol  2.5 mg Nebulization BID  . benzonatate  100 mg Oral TID  . dextrose 5 % 100 mL with magnesium sulfate 3 g infusion   Intravenous Once  . docusate sodium  100 mg Oral BID  . enoxaparin  40 mg Subcutaneous Q24H  . gabapentin  100 mg Oral TID  . ipratropium  0.5 mg Nebulization BID  . levofloxacin (LEVAQUIN) IV  750 mg Intravenous Q24H  . levothyroxine  100 mcg Oral Q M,W,F  . levothyroxine  50 mcg Oral Q T,Th,S,Su  . lidocaine  4-5 mL Mouth/Throat Daily  . magnesium chloride  1 tablet Oral Daily  . oxyCODONE  20 mg Oral Q12H  . pantoprazole  40 mg Oral Daily  . piperacillin-tazobactam (ZOSYN)  IV  3.375 g Intravenous Q8H    . vancomycin  1,000 mg Intravenous Q12H  . DISCONTD: magnesium sulfate 1 - 4 g bolus IVPB  3 g Intravenous Once   Continuous  Infusions:    . sodium chloride 50 mL/hr at 07/22/11 1745   PRN Meds:.acetaminophen, acetaminophen, albuterol, alum & mag hydroxide-simeth, bisacodyl, emollient, HYDROcodone-acetaminophen, ibuprofen, ipratropium, lidocaine-prilocaine, magic mouthwash, meclizine, ondansetron (ZOFRAN) IV, ondansetron, prochlorperazine, senna-docusate, zolpidem  Assessment/Plan: 1. Healthcare associated pneumonia:  Clinical improvement. Patient with fever yesterday and today with tmax  103.1.Blood cultures with no growth to date.  Repeat blood cultures obtained.SOB improved.Continue IV vancomycin, Zosyn, levaquin.Albuterol nebs and incentive spirometer for pulmonary toilet.  sputum cultures and blood cultures pending. Influenza PCR negative. Urine legionella and pneumococcus antigen pending. 2. Fever Unknown etiology. Blood cultures pending. Urine cultures pending, urine legionella antigen pending. Urine pneumococcus antigen pending.Continue empiric IV vanc/Zosyn/Levaquin. ID consult in AM. 3. Pancytopenia: Secondary to multiple myeloma and recent chemotherapy. Patient with symptomatic anemia s/p 2 units. Follow. Oncology consulted. 4. HYPOTHYROIDISM: Her TSH is elevated, free T4 WNL. Continue current synthroid dose f/u as outpatient. 5. Recurrent multiple myeloma: Last chemotherapy with Velcade, Doxil, Decadron on Thursday. Oncology consult pending.  6. Symtomatic anemia Likely secondary to chemo. Transfuse 2 units PRBCs. Follow h/h. 7. dizziness on admission suspect this was secondary to transient hypotension, improving, meclizine PRN. PT for vestibular evaluation pending. continue to monitor 8. DVT prophylaxis with Lovenox and SCDs, monitor platelets closely   LOS: 4 days   [facility] Pager: [phone] 07/23/2011, 6:47 PM

## 2011-07-23 NOTE — Progress Notes (Signed)
Pt had a temperature of 103.1 (axillary) this morning. Administered 650 mg of tylenol. Re-checked temp an hour later. Pt's temperature decreased to 101.1 (oral). Night coverage made aware. Will continue to monitor.

## 2011-07-23 NOTE — Progress Notes (Signed)
Occupational Therapy Evaluation Patient Details Name: [name] MRN: [id number] DOB: [birth year] Today's Date: 07/23/2011 [id number] ev2  Problem List:  Patient Active Problem List  Diagnoses  . MULTIPLE  MYELOMA  . HYPOTHYROIDISM  . GERD  . HEMORRHAGE OF RECTUM AND ANUS  . SCIATICA  . FATIGUE  . SHORTNESS OF BREATH  . CHEST PAIN  . CHEST DISCOMFORT  . HYPERTENSION, HX OF  . Weakness generalized  . Dizziness  . PNA (pneumonia)  . Dehydration  . Pancytopenia  . SOB (shortness of breath) on exertion  . Hypokalemia  . Fever    Past Medical History:  Past Medical History  Diagnosis Date  . Cancer multiple myeloma  . Thyroid disease Hypothyroidism  . Hypercholesterolemia   . Compression fracture 04/08/2007    pathologic compression fracture  . Hypothyroidism   . FHx: chemotherapy     s/p 5 cycle revlimid/low dose decadron,s/p velcade,doxil,decadron,  . Hx of radiation therapy 05/05/07-05/18/07,& 03/05/11-03/21/11-    l3&l5 in 2008, t2-t6 03/2011  . GERD (gastroesophageal reflux disease)   . Insomnia     associated with steroids  . Nausea   . Constipation     takes oxycontin,vicodin   Past Surgical History:  Past Surgical History  Procedure Date  . Cholecystectomy   . Knee surgery   . Knee surgery     OT Assessment/Plan/Recommendation OT Assessment Clinical Impression Statement: This 61 year old female was admitted with dizziness and pna.  She has a h/o multiple myeloma and has been independent at home.  Pt presents with unsteadiness and is symptomatic with gaze stabilization...suspect L hypofunction (refixation was subtle).  Will instruct in vestibular exercises and follow for ADLs related to balance as well as further assessing DME needs. OT Recommendation/Assessment: Patient will need skilled OT in the acute care venue OT Problem List: Decreased strength;Decreased activity tolerance;Impaired balance (sitting and/or standing);Decreased knowledge of use of DME  or AE;Other (comment) (Left hypofunction (vestibular)) OT Therapy Diagnosis : Generalized weakness OT Plan OT Frequency: Min 2X/week OT Treatment/Interventions: Self-care/ADL training;Therapeutic activities;Patient/family education;Balance training OT Recommendation Follow Up Recommendations: Home health OT;Other (comment) (RECOMMEND HHPT OR OT HAS VESTIBULAR TRAINING) Equipment Recommended: Other (comment) (OT will further assess) Individuals Consulted Consulted and Agree with Results and Recommendations: Patient OT Goals Acute Rehab OT Goals OT Goal Formulation: With patient Time For Goal Achievement: 2 weeks ADL Goals Pt Will Transfer to Toilet: with supervision;Regular height toilet;Ambulation ADL Goal: Toilet Transfer - Progress: Goal set today Pt Will Perform Tub/Shower Transfer: with supervision;Other (comment) (with DME prn) ADL Goal: Tub/Shower Transfer - Progress: Goal set today Miscellaneous OT Goals Miscellaneous OT Goal #1: pt will be independent with x1 adaptation exercises for left hypofunction, in supported sitting OT Goal: Miscellaneous Goal #1 - Progress: Goal set today Miscellaneous OT Goal #2: pt will gather adl supplies with supervision, ambulating with ad prn OT Goal: Miscellaneous Goal #2 - Progress: Goal set today  OT Evaluation Precautions/Restrictions  Precautions Precautions: Fall Restrictions Weight Bearing Restrictions: No Prior Functioning Home Living Lives With: Spouse   ADL ADL Eating/Feeding: Simulated;Independent Where Assessed - Eating/Feeding: Chair Grooming: Performed;Teeth care;Supervision/safety Where Assessed - Grooming: Standing at sink Upper Body Bathing: Simulated;Set up Where Assessed - Upper Body Bathing: Sitting, chair;Supported Lower Body Bathing: Set up;Supervision/safety;Simulated Where Assessed - Lower Body Bathing: Sit to stand from bed Upper Body Dressing: Performed;Set up Where Assessed - Upper Body Dressing: Sitting,  chair;Supported Lower Body Dressing: Performed;Supervision/safety;Set up (performed socks) Where Assessed - Lower Body Dressing: Sit to  stand from bed Toilet Transfer: Simulated;Minimal assistance (min guard to chair) Toilet Transfer Method: Ambulating Toileting - Clothing Manipulation: Simulated;Supervision/safety Where Assessed - Toileting Clothing Manipulation: Sit to stand from 3-in-1 or toilet Toileting - Hygiene: Simulated;Supervision/safety Where Assessed - Toileting Hygiene: Sit to stand from 3-in-1 or toilet Ambulation Related to ADLs: min guard:  slightly unsteady ADL Comments: Tested functionally for BPPV -  Tested VOR and pt symptomatic.  L had subtle refixation with Head Thrust test Vision/Perception  Vision - History Baseline Vision: Bifocals Patient Visual Report: No change from baseline Vision - Assessment Eye Alignment: Within Functional Limits Cognition Cognition Arousal/Alertness: Awake/alert Overall Cognitive Status: Appears within functional limits for tasks assessed Sensation/Coordination   Extremity Assessment RUE Assessment RUE Assessment: Within Functional Limits LUE Assessment LUE Assessment: Within Functional Limits Mobility  Bed Mobility Bed Mobility: Yes Supine to Sit: 6: Modified independent (Device/Increase time);With rails Transfers Sit to Stand: 5: Supervision Exercises   End of Session OT - End of Session Activity Tolerance: Patient tolerated treatment well Patient left: in chair;with call bell in reach General Behavior During Session: [facility] for tasks performed Cognition: [facility] for tasks performed [facility], OTR/L [phone] 07/23/2011  Kenise Barraco 07/23/2011, 10:19 AM

## 2011-07-23 NOTE — Consult Note (Signed)
Referral MD  Reason for Referral: Pancytopenia related to her multiple myeloma and recent systemic chemotherapy   Chief Complaint  Patient presents with  . Shortness of Breath  : She reports episodes of dizziness and syncope at home prior to her admission. She also had chills although did not check her temperature prior to her admission. She recently completed her fourth cycle of systemic chemotherapy with Velcade and Doxil given December 31 January 3 January 7 and January 10. She is currently on a chemotherapy holiday due to fatigue and malaise related to her chemotherapy.   HPI:  Past Medical History  Diagnosis Date  . Cancer multiple myeloma  . Thyroid disease Hypothyroidism  . Hypercholesterolemia   . Compression fracture 04/08/2007    pathologic compression fracture  . Hypothyroidism   . FHx: chemotherapy     s/p 5 cycle revlimid/low dose decadron,s/p velcade,doxil,decadron,  . Hx of radiation therapy 05/05/07-05/18/07,& 03/05/11-03/21/11-    l3&l5 in 2008, t2-t6 03/2011  . GERD (gastroesophageal reflux disease)   . Insomnia     associated with steroids  . Nausea   . Constipation     takes oxycontin,vicodin  :  Past Surgical History  Procedure Date  . Cholecystectomy   . Knee surgery   . Knee surgery   :  Current facility-administered medications:0.9 %  sodium chloride infusion, , Intravenous, Continuous, Zannie Cove, MD, Last Rate: 50 mL/hr at 07/22/11 1745;  acetaminophen (TYLENOL) suppository 650 mg, 650 mg, Rectal, Q6H PRN, Ramiro Harvest, MD;  acetaminophen (TYLENOL) tablet 1,000 mg, 1,000 mg, Oral, Once, Ramiro Harvest, MD, 1,000 mg at 07/22/11 1144 acetaminophen (TYLENOL) tablet 650 mg, 650 mg, Oral, Q6H PRN, Ramiro Harvest, MD, 650 mg at 07/23/11 0529;  albuterol (PROVENTIL) (5 MG/ML) 0.5% nebulizer solution 2.5 mg, 2.5 mg, Nebulization, Q2H PRN, Ramiro Harvest, MD;  albuterol (PROVENTIL) (5 MG/ML) 0.5% nebulizer solution 2.5 mg, 2.5 mg, Nebulization, BID,  Provider Default, MD, 2.5 mg at 07/22/11 2021 alum & mag hydroxide-simeth (MAALOX/MYLANTA) 200-200-20 MG/5ML suspension 30 mL, 30 mL, Oral, Q6H PRN, Ramiro Harvest, MD;  benzonatate (TESSALON) capsule 100 mg, 100 mg, Oral, TID, Ramiro Harvest, MD, 100 mg at 07/22/11 2136;  bisacodyl (DULCOLAX) suppository 10 mg, 10 mg, Rectal, Daily PRN, Ramiro Harvest, MD;  diphenhydrAMINE (BENADRYL) capsule 25 mg, 25 mg, Oral, Once, Ramiro Harvest, MD, 25 mg at 07/22/11 1144 docusate sodium (COLACE) capsule 100 mg, 100 mg, Oral, BID, Ramiro Harvest, MD, 100 mg at 07/22/11 2136;  emollient (BIAFINE) cream, , Topical, PRN, Ramiro Harvest, MD;  enoxaparin (LOVENOX) injection 40 mg, 40 mg, Subcutaneous, Q24H, Ramiro Harvest, MD, 40 mg at 07/22/11 2338;  furosemide (LASIX) injection 20 mg, 20 mg, Intravenous, Once, Ramiro Harvest, MD, 20 mg at 07/22/11 1407 gabapentin (NEURONTIN) capsule 100 mg, 100 mg, Oral, TID, Ramiro Harvest, MD, 100 mg at 07/22/11 2136;  HYDROcodone-acetaminophen (NORCO) 5-325 MG per tablet 1.5 tablet, 1.5 tablet, Oral, Q6H PRN, Randall K Absher, PHARMD;  ibuprofen (ADVIL,MOTRIN) tablet 400 mg, 400 mg, Oral, Q6H PRN, Ramiro Harvest, MD;  ipratropium (ATROVENT) nebulizer solution 0.5 mg, 0.5 mg, Nebulization, Q2H PRN, Ramiro Harvest, MD ipratropium (ATROVENT) nebulizer solution 0.5 mg, 0.5 mg, Nebulization, BID, Provider Default, MD, 0.5 mg at 07/22/11 2021;  Levofloxacin (LEVAQUIN) IVPB 750 mg, 750 mg, Intravenous, Q24H, Ramiro Harvest, MD, 750 mg at 07/22/11 1035;  levothyroxine (SYNTHROID, LEVOTHROID) tablet 100 mcg, 100 mcg, Oral, Q M,W,F, Randall K Absher, PHARMD, 100 mcg at 07/22/11 1036 levothyroxine (SYNTHROID, LEVOTHROID) tablet 50 mcg, 50 mcg, Oral, Q T,Th,S,Su, Brynda Greathouse  K Absher, PHARMD, 50 mcg at 07/21/11 1054;  lidocaine (XYLOCAINE) 2 % viscous mouth solution 4-5 mL, 4-5 mL, Mouth/Throat, Daily, Ramiro Harvest, MD;  lidocaine-prilocaine (EMLA) cream, , Topical, PRN, Ramiro Harvest,  MD;  magic mouthwash, 5 mL, Oral, Q6H PRN, Ramiro Harvest, MD, 5 mL at 07/21/11 1059 magnesium chloride (SLOW-MAG) 64 MG SR tablet 64 mg, 1 tablet, Oral, Daily, Ramiro Harvest, MD, 64 mg at 07/22/11 1035;  meclizine (ANTIVERT) tablet 25 mg, 25 mg, Oral, TID PRN, Ramiro Harvest, MD;  ondansetron [facility]) injection 4 mg, 4 mg, Intravenous, Q6H PRN, Ramiro Harvest, MD, 4 mg at 07/22/11 2036;  ondansetron (ZOFRAN) tablet 4 mg, 4 mg, Oral, Q6H PRN, Ramiro Harvest, MD oxyCODONE (OXYCONTIN) 12 hr tablet 20 mg, 20 mg, Oral, Q12H, Ramiro Harvest, MD, 20 mg at 07/22/11 2136;  pantoprazole (PROTONIX) EC tablet 40 mg, 40 mg, Oral, Daily, Ramiro Harvest, MD, 40 mg at 07/22/11 1035;  piperacillin-tazobactam (ZOSYN) IVPB 3.375 g, 3.375 g, Intravenous, Q8H, Randall K Absher, PHARMD, 3.375 g at 07/22/11 2338;  prochlorperazine (COMPAZINE) tablet 10 mg, 10 mg, Oral, Q6H PRN, Ramiro Harvest, MD senna-docusate (Senokot-S) tablet 1 tablet, 1 tablet, Oral, QHS PRN, Ramiro Harvest, MD, 1 tablet at 07/19/11 2234;  vancomycin (VANCOCIN) IVPB 1000 mg/200 mL premix, 1,000 mg, Intravenous, Q12H, Thuyvan Thi Phan, PHARMD, 1,000 mg at 07/22/11 2136;  zolpidem (AMBIEN) tablet 5 mg, 5 mg, Oral, QHS PRN, Ramiro Harvest, MD, 5 mg at 07/19/11 2255 Facility-Administered Medications Ordered in Other Encounters: 0.9 %  sodium chloride infusion, , Intravenous, Once, Shrihan Putt K. Everhett Bozard, MD;  ondansetron [facility]) IVPB 8 mg, 8 mg, Intravenous, Once, Charlissa Petros K. Maebry Obrien, MD;  sodium chloride 0.9 % injection 10 mL, 10 mL, Intracatheter, PRN, Elleen Coulibaly K. Arturo Freundlich, MD:     . acetaminophen  1,000 mg Oral Once  . albuterol  2.5 mg Nebulization BID  . benzonatate  100 mg Oral TID  . diphenhydrAMINE  25 mg Oral Once  . docusate sodium  100 mg Oral BID  . enoxaparin  40 mg Subcutaneous Q24H  . furosemide  20 mg Intravenous Once  . gabapentin  100 mg Oral TID  . ipratropium  0.5 mg Nebulization BID  . levofloxacin (LEVAQUIN) IV  750 mg  Intravenous Q24H  . levothyroxine  100 mcg Oral Q M,W,F  . levothyroxine  50 mcg Oral Q T,Th,S,Su  . lidocaine  4-5 mL Mouth/Throat Daily  . magnesium chloride  1 tablet Oral Daily  . oxyCODONE  20 mg Oral Q12H  . pantoprazole  40 mg Oral Daily  . piperacillin-tazobactam (ZOSYN)  IV  3.375 g Intravenous Q8H  . vancomycin  1,000 mg Intravenous Q12H  :  No Known Allergies:  Family History  Problem Relation Age of Onset  . Cancer Brother     lung ca  . Cancer Maternal Uncle     colon  . Cancer Maternal Grandmother     breast ca  :  History   Social History  . Marital Status: Married    Spouse Name: N/A    Number of Children: N/A  . Years of Education: N/A   Occupational History  . Not on file.   Social History Main Topics  . Smoking status: Former Smoker -- 0.2 packs/day for 1 years  . Smokeless tobacco: Not on file  . Alcohol Use:   . Drug Use:   . Sexually Active:    Other Topics Concern  . Not on file   Social History Narrative  . No  narrative on file  :  A comprehensive review of systems was negative except for: Constitutional: positive for anorexia, chills, fatigue, malaise and weight loss Respiratory: positive for cough  Exam: Patient Vitals for the past 24 hrs:  BP Temp Temp src Pulse Resp SpO2 Weight  07/23/11 0722 - 99.7 F (37.6 C) Oral - - - -  07/23/11 0628 - 101.1 F (38.4 C) Oral - - - -  07/23/11 0600 101/65 mmHg 103.1 F (39.5 C) Axillary 108  20  93 % -  07/23/11 0500 - - - - - - 79.1 kg (174 lb 6.1 oz)  07/22/11 2200 115/65 mmHg 99.7 F (37.6 C) Oral 106  20  94 % -  07/22/11 1907 - 98.6 F (37 C) - - - - -  07/22/11 1622 107/70 mmHg 97.9 F (36.6 C) Oral 80  20  - -  07/22/11 1523 111/72 mmHg 97.8 F (36.6 C) Oral 79  20  - -  07/22/11 1422 102/61 mmHg 97.9 F (36.6 C) Oral 78  20  - -  07/22/11 1345 116/70 mmHg 98 F (36.7 C) Oral 84  20  - -  07/22/11 1305 110/69 mmHg 98.2 F (36.8 C) Oral 90  20  - -  07/22/11 1207 119/68  mmHg 99.8 F (37.7 C) Oral 94  20  - -  07/22/11 1150 118/70 mmHg 101 F (38.3 C) Oral 94  20  - -  07/22/11 0916 - - - - - 91 % -   General appearance: alert, cooperative, appears stated age and no distress Head: Normocephalic, without obvious abnormality, atraumatic Eyes: conjunctivae/corneas clear. PERRL, EOM's intact. Fundi benign. Throat: lips, mucosa, and tongue normal; teeth and gums normal Neck: no adenopathy, no carotid bruit, no JVD, supple, symmetrical, trachea midline and thyroid not enlarged, symmetric, no tenderness/mass/nodules Resp: clear to auscultation bilaterally Cardio: regular rate and rhythm, S1, S2 normal, no murmur, click, rub or gallop GI: soft, non-tender; bowel sounds normal; no masses,  no organomegaly Extremities: extremities normal, atraumatic, no cyanosis or edema   Basename 07/23/11 0520 07/22/11 2215  WBC 3.3* 3.7*  HGB 9.7* 9.4*  HCT 27.9* 26.5*  PLT 136* 120*    Basename 07/23/11 0520 07/22/11 0400  NA 132* 137  K 3.5 4.0  CL 100 105  CO2 23 23  GLUCOSE 87 88  BUN 5* 4*  CREATININE 1.05 1.01  CALCIUM 9.0 8.8     Pathology:n/a  Dg Chest 2 View  07/19/2011  *RADIOLOGY REPORT*  Clinical Data: Shortness of breath.  Weakness.  Cough.  Myeloma.  CHEST - 2 VIEW  Comparison: 06/26/2011  Findings: Right power port tip projects over the SVC.  Cardiac and mediastinal contours appear unremarkable.  Mild prominence of the pulmonary vasculature noted.  There is indistinct opacity in the right paramediastinal region which appears new compared to 03/23/2011, suspicious for pneumonia or possibly atelectasis.  IMPRESSION:  1.  Indistinct right paramediastinal density, query pneumonia or atelectasis. 2.  Borderline prominence of the pulmonary vasculature.  Original Report Authenticated By: Dellia Cloud, M.D.   Dg Chest 2 View  06/26/2011  *RADIOLOGY REPORT*  Clinical Data: Weakness.  Dizziness, and sores  CHEST - 2 VIEW  Comparison: 03/23/2011   Findings: Right chest wall porta-catheter is noted with tip in the SVC.  No pleural effusion or pulmonary edema.  No airspace consolidation identified.  Review of the visualized osseous structures is unremarkable.  IMPRESSION:  1.  No active cardiopulmonary abnormalities.  Original  Report Authenticated By: Rosealee Albee, M.D.   Ct Head Wo Contrast  07/19/2011  *RADIOLOGY REPORT*  Clinical Data: Dizziness  CT HEAD WITHOUT CONTRAST  Technique:  Contiguous axial images were obtained from the base of the skull through the vertex without contrast.  Comparison: CT brain scan of 04/11/2009  Findings: The ventricular system is stable in size and configuration, and the septum is in a normal midline position.  The fourth ventricle and basilar cisterns appear normal.  No hemorrhage, mass lesion, or acute infarction is seen.  On bone window images, however, there are now calvarial lucencies that are new compared to the prior study.  Is there clinical suspicion of multiple myeloma or metastatic disease?  IMPRESSION:  1.  Negative unenhanced CT of the brain. 2.  However there are new lucencies within the calvarium suspicious for either multiple myeloma or metastatic involvement of the calvarium.  Correlate clinically.  Original Report Authenticated By: Juline Patch, M.D.   Ct Angio Chest W/cm &/or Wo Cm  07/19/2011  *RADIOLOGY REPORT*  Clinical Data: Shortness of breath, elevated D-dimer.  History of multiple myeloma  CT ANGIOGRAPHY CHEST  Technique:  Multidetector CT imaging of the chest using the standard protocol during bolus administration of intravenous contrast. Multiplanar reconstructed images including MIPs were obtained and reviewed to evaluate the vascular anatomy.  Contrast: 60mL OMNIPAQUE IOHEXOL 300 MG/ML IV SOLN  Comparison: Chest radiograph same date, most recent chest CT 12/22/2009  Findings: Right-sided Port-A-Cath is in place with the tip terminating in the low right atrium.  Heart size is normal.  No  lymphadenopathy.  Great vessels are normal in caliber, with incidental note made of a bovine arch configuration, a normal variant.  There is a central bronchial wall thickening as well as right greater than left upper lobe alveolar airspace consolidation, likely accounting for the history of the paratracheal density seen on prior chest radiography.  There is also minimal superior segment right lower lobe airspace consolidation.  Trace right greater than left pleural effusions are noted.  No pneumothorax.  The study is of adequate technical quality for evaluation for pulmonary embolism up to and including the 3rd order pulmonary arteries. No focal filling defect is seen to suggest acute pulmonary embolism.  No pericardial effusion.  No axillary lymphadenopathy.  No new osseous abnormality.  Subtle areas of trabecular rarefaction in the spine could reflect osteoporosis but could also indicate myelomatous involvement.  There are also subtle lytic lesions in the sternum.  Compared to the prior exam, these are new/increased.  IMPRESSION: Multilobar airspace consolidation and trace pleural effusions as above, most compatible with pneumonia.  Myelomatous involvement would be less likely, as would treatment effect. No CT evidence for acute pulmonary embolism.  New/increasing lytic osseous lesions, also compatible with the given history of multiple myeloma.  Original Report Authenticated By: Harrel Lemon, M.D.    Assessment and Plan: Pancytopenia related to recent chemotherapy specifically Velcade and Doxil status post 4 cycles. Agree with current management. Her hemoglobin and hematocrit, white count and platelets of all recovered nicely. She will continue on her chemotherapy holiday and this point in followup as previously scheduled with Dr. Gwenyth Bouillon in the outpatient setting. Would recommend continuing the current management for her healthcare acquired pneumonia per primary care team. No further intervention from  oncology needed at this time.  Marlana Salvage 07/23/2011 8:50 AM  Hematology/oncology attending: Ms. Imhoff iis seen and examined today. The patient is feeling fine. I agree with the  above. She has a history of multiple myeloma. He was recently treated with 4 cycles of chemotherapy with Velcade, Doxil and Decadron. Plan was to give the patient back after the 4th cycle. She was admitted with pancytopenia which significantly improve. The patient has no other significant complaints today and she may be ready for discharge soon. I agree with the current treatment plan. The patient will be seen on outpatient basis for evaluation and management of her multiple myeloma after discharge. Thank you so much for taken good care of Ms. Govan. I will continue to follow up with you on as needed basis.

## 2011-07-23 NOTE — Progress Notes (Signed)
Patient and her daughter have requested that her husband Karenna Romanoff not be allowed to visit because he upsets her..  Advised to explain this to the spouse since he has been visiting since her admission to the hospital.  If he does come to room told to call for nurse and we would ask him to leave and/or call security if needed to have him leave her room.

## 2011-07-24 DIAGNOSIS — J189 Pneumonia, unspecified organism: Secondary | ICD-10-CM

## 2011-07-24 LAB — CBC
Hemoglobin: 9 g/dL — ABNORMAL LOW (ref 12.0–15.0)
MCH: 30.7 pg (ref 26.0–34.0)
Platelets: 134 10*3/uL — ABNORMAL LOW (ref 150–400)
RBC: 2.93 MIL/uL — ABNORMAL LOW (ref 3.87–5.11)
WBC: 2.9 10*3/uL — ABNORMAL LOW (ref 4.0–10.5)

## 2011-07-24 LAB — URINE CULTURE
Culture  Setup Time: 201301240114
Culture: NO GROWTH

## 2011-07-24 LAB — BASIC METABOLIC PANEL
Calcium: 9.4 mg/dL (ref 8.4–10.5)
GFR calc Af Amer: 52 mL/min — ABNORMAL LOW (ref >90)
GFR calc non Af Amer: 45 mL/min — ABNORMAL LOW (ref >90)
Glucose, Bld: 102 mg/dL — ABNORMAL HIGH (ref 70–99)
Potassium: 3.3 mEq/L — ABNORMAL LOW (ref 3.5–5.1)
Sodium: 136 mEq/L (ref 135–145)

## 2011-07-24 LAB — DIFFERENTIAL
Eosinophils Absolute: 0.1 10*3/uL (ref 0.0–0.7)
Lymphocytes Relative: 4 % — ABNORMAL LOW (ref 12–46)
Lymphs Abs: 0.1 10*3/uL — ABNORMAL LOW (ref 0.7–4.0)
Monocytes Relative: 11 % (ref 3–12)
Neutro Abs: 2.4 10*3/uL (ref 1.7–7.7)
Neutrophils Relative %: 82 % — ABNORMAL HIGH (ref 43–77)

## 2011-07-24 LAB — VANCOMYCIN, TROUGH: Vancomycin Tr: 25.2 ug/mL (ref 10.0–20.0)

## 2011-07-24 LAB — MAGNESIUM: Magnesium: 2.5 mg/dL (ref 1.5–2.5)

## 2011-07-24 MED ORDER — SODIUM CHLORIDE 0.9 % IV SOLN
750.0000 mg | Freq: Two times a day (BID) | INTRAVENOUS | Status: DC
  Administered 2011-07-24 – 2011-07-25 (×3): 750 mg via INTRAVENOUS
  Filled 2011-07-24 (×6): qty 750

## 2011-07-24 MED ORDER — POTASSIUM CHLORIDE 10 MEQ/100ML IV SOLN
10.0000 meq | INTRAVENOUS | Status: AC
  Administered 2011-07-24 (×3): 10 meq via INTRAVENOUS
  Filled 2011-07-24 (×3): qty 100

## 2011-07-24 NOTE — Progress Notes (Signed)
ANTIBIOTIC CONSULT NOTE - follow up  Pharmacy Consult for Vancomycin Indication: rule out pneumonia  No Known Allergies  Patient Measurements: Height: 5\' 5"  (165.1 cm) Weight: 172 lb 2.9 oz (78.1 kg) IBW/kg (Calculated) : 57   Vital Signs: Temp: 99.5 F (37.5 C) (01/23 1400) Temp src: Oral (01/23 1400) BP: 116/65 mmHg (01/23 1400) Pulse Rate: 100  (01/23 1400) Intake/Output from previous day: 01/22 0701 - 01/23 0700 In: 1038 [P.O.:480; IV Piggyback:558] Out: 700 [Urine:700] Intake/Output from this shift:    Labs:  Basename 07/24/11 0540 07/23/11 0520 07/22/11 2215 07/22/11 0400  WBC 2.9* 3.3* 3.7* --  HGB 9.0* 9.7* 9.4* --  PLT 134* 136* 120* --  LABCREA -- -- -- --  CREATININE 1.27* 1.05 -- 1.01   Estimated Creatinine Clearance: 48.6 ml/min (by C-G formula based on Cr of 1.27).  Basename 07/24/11 1812  VANCOTROUGH 25.2*  VANCOPEAK --  Drue Dun --  GENTTROUGH --  GENTPEAK --  GENTRANDOM --  TOBRATROUGH --  Nolen Mu --  TOBRARND --  AMIKACINPEAK --  AMIKACINTROU --  AMIKACIN --     Microbiology: Recent Results (from the past 720 hour(s))  TECHNOLOGIST REVIEW     Status: Normal   Collection Time   07/04/11  8:42 AM      Component Value Range Status Comment   Technologist Review Occ Metas and Myelocytes present   Final   CULTURE, BLOOD (ROUTINE X 2)     Status: Normal (Preliminary result)   Collection Time   07/19/11  7:36 PM      Component Value Range Status Comment   Specimen Description BLOOD LEFT ARM   Final    Special Requests BOTTLES DRAWN AEROBIC AND ANAEROBIC 6CC   Final    Setup Time [id number]   Final    Culture     Final    Value:        BLOOD CULTURE RECEIVED NO GROWTH TO DATE CULTURE WILL BE HELD FOR 5 DAYS BEFORE ISSUING A FINAL NEGATIVE REPORT   Report Status PENDING   Incomplete   CULTURE, BLOOD (ROUTINE X 2)     Status: Normal (Preliminary result)   Collection Time   07/19/11  7:42 PM      Component Value Range Status Comment   Specimen Description BLOOD LEFT HAND   Final    Special Requests BOTTLES DRAWN AEROBIC AND ANAEROBIC 4CC   Final    Setup Time [id number]   Final    Culture     Final    Value:        BLOOD CULTURE RECEIVED NO GROWTH TO DATE CULTURE WILL BE HELD FOR 5 DAYS BEFORE ISSUING A FINAL NEGATIVE REPORT   Report Status PENDING   Incomplete   URINE CULTURE     Status: Normal   Collection Time   07/20/11  9:35 AM      Component Value Range Status Comment   Specimen Description URINE, CLEAN CATCH   Final    Special Requests NONE   Final    Setup Time [id number]   Final    Colony Count NO GROWTH   Final    Culture NO GROWTH   Final    Report Status 07/21/2011 FINAL   Final   URINE CULTURE     Status: Normal   Collection Time   07/23/11  9:12 AM      Component Value Range Status Comment   Specimen Description URINE, RANDOM   Final    Special  Requests NONE   Final    Setup Time [id number]   Final    Colony Count NO GROWTH   Final    Culture NO GROWTH   Final    Report Status 07/24/2011 FINAL   Final   CULTURE, SPUTUM-ASSESSMENT     Status: Normal   Collection Time   07/23/11 11:24 AM      Component Value Range Status Comment   Specimen Description SPUTUM   Final    Special Requests NONE   Final    Sputum evaluation     Final    Value: THIS SPECIMEN IS ACCEPTABLE. RESPIRATORY CULTURE REPORT TO FOLLOW.   Report Status 07/23/2011 FINAL   Final   CULTURE, RESPIRATORY     Status: Normal (Preliminary result)   Collection Time   07/23/11 11:24 AM      Component Value Range Status Comment   Specimen Description SPUTUM   Final    Special Requests NONE   Final    Gram Stain     Final    Value: RARE WBC PRESENT,BOTH PMN AND MONONUCLEAR     FEW SQUAMOUS EPITHELIAL CELLS PRESENT     NO ORGANISMS SEEN   Culture NO GROWTH 1 DAY   Final    Report Status PENDING   Incomplete     Medical History: Past Medical History  Diagnosis Date  . Cancer multiple myeloma  . Thyroid disease Hypothyroidism    . Hypercholesterolemia   . Compression fracture 04/08/2007    pathologic compression fracture  . Hypothyroidism   . FHx: chemotherapy     s/p 5 cycle revlimid/low dose decadron,s/p velcade,doxil,decadron,  . Hx of radiation therapy 05/05/07-05/18/07,& 03/05/11-03/21/11-    l3&l5 in 2008, t2-t6 03/2011  . GERD (gastroesophageal reflux disease)   . Insomnia     associated with steroids  . Nausea   . Constipation     takes oxycontin,vicodin    Medications:  Scheduled:     . albuterol  2.5 mg Nebulization BID  . benzonatate  100 mg Oral TID  . dextrose 5 % 100 mL with magnesium sulfate 3 g infusion   Intravenous Once  . docusate sodium  100 mg Oral BID  . enoxaparin  40 mg Subcutaneous Q24H  . gabapentin  100 mg Oral TID  . ipratropium  0.5 mg Nebulization BID  . levofloxacin (LEVAQUIN) IV  750 mg Intravenous Q24H  . levothyroxine  100 mcg Oral Q M,W,F  . levothyroxine  50 mcg Oral Q T,Th,S,Su  . lidocaine  4-5 mL Mouth/Throat Daily  . magnesium chloride  1 tablet Oral Daily  . oxyCODONE  20 mg Oral Q12H  . pantoprazole  40 mg Oral Daily  . piperacillin-tazobactam (ZOSYN)  IV  3.375 g Intravenous Q8H  . potassium chloride  10 mEq Intravenous Q1 Hr x 3  . vancomycin  1,000 mg Intravenous Q12H   Infusions:     . sodium chloride 75 mL/hr at 07/24/11 0925   Assessment:  [name] with h/o recurrent mult. myeloma admitted with pneumonia.  Day #6 Vanc 1g IV q12h.  Day #6 Zosyn 3.375g IV q8h.  Day #3 Levaquin 750mg  IV q24h.  Vanc trough above target range.   SCr is rising. CrCl(N) 53.  Tm 99.5  All cultures have been negative.  Goal of Therapy:  Vancomycin trough level 15-20 mcg/ml  Plan:   Reduce Vanc to 750mg  IV q12h.  Zosyn and Levaquin doses appropriate.  F/u renal fxn, cultures, duration.  Reece Packer 07/24/2011,8:03 PM

## 2011-07-24 NOTE — Progress Notes (Signed)
Pt has high temp of 102.9, gave tylenol 650; see mar. MD notified, no new orders at this time. will continue to monitor.   William Dalton Lajoi 07/24/2011 1:07 PM

## 2011-07-24 NOTE — Progress Notes (Signed)
Pt reported coughing up a small amount of bloody sputum. Pt denied chest pain, SOB. VS assessed and stable. Asked pt to save sputum next time. Night coverage made aware. Orders received. Will continue to monitor. Pt reported feeling anxious. Night coverage notified of this as well. Orders received. Will continue to monitor.

## 2011-07-24 NOTE — Progress Notes (Signed)
Patient ID: [name], female   DOB: 08/31/1950, 61 y.o.   MRN: [id number]  Subjective: No events overnight. Patient denies chest pain, shortness of breath, abdominal pain.   Objective:  Vital signs in last 24 hours:  Filed Vitals:   07/24/11 0526 07/24/11 1256 07/24/11 1400 07/24/11 2024  BP: 111/67  116/65   Pulse: 84  100   Temp: 98.1 F (36.7 C) 102.9 F (39.4 C) 99.5 F (37.5 C)   TempSrc: Oral  Oral   Resp: 18  18   Height:      Weight:      SpO2: 91%  94% 93%    Intake/Output from previous day:   Intake/Output Summary (Last 24 hours) at 07/24/11 2040 Last data filed at 07/24/11 1700  Gross per 24 hour  Intake   1270 ml  Output      0 ml  Net   1270 ml    Physical Exam: General: Alert, awake, oriented x3, in no acute distress. HEENT: No bruits, no goiter. Moist mucous membranes, no scleral icterus, no conjunctival pallor. Heart: Regular rate and rhythm, S1/S2 +, no murmurs, rubs, gallops. Lungs: Clear to auscultation bilaterally. No wheezing, no rhonchi, no rales.  Abdomen: Soft, nontender, nondistended, positive bowel sounds. Extremities: No clubbing or cyanosis, no pitting edema,  positive pedal pulses. Neuro: Grossly nonfocal.  Lab Results:  Basic Metabolic Panel:    Component Value Date/Time   NA 136 07/24/2011 0540   K 3.3* 07/24/2011 0540   CL 104 07/24/2011 0540   CO2 24 07/24/2011 0540   BUN 9 07/24/2011 0540   CREATININE 1.27* 07/24/2011 0540   GLUCOSE 102* 07/24/2011 0540   CALCIUM 9.4 07/24/2011 0540   CBC:    Component Value Date/Time   WBC 2.9* 07/24/2011 0540   WBC 3.1* 07/08/2011 1404   HGB 9.0* 07/24/2011 0540   HGB 10.0* 07/08/2011 1404   HCT 26.2* 07/24/2011 0540   HCT 29.1* 07/08/2011 1404   PLT 134* 07/24/2011 0540   PLT 102* 07/08/2011 1404   MCV 89.4 07/24/2011 0540   MCV 92.4 07/08/2011 1404   NEUTROABS 2.4 07/24/2011 0540   NEUTROABS 2.4 07/08/2011 1404   LYMPHSABS 0.1* 07/24/2011 0540   LYMPHSABS 0.2* 07/08/2011 1404   MONOABS 0.3  07/24/2011 0540   MONOABS 0.4 07/08/2011 1404   EOSABS 0.1 07/24/2011 0540   EOSABS 0.0 07/08/2011 1404   BASOSABS 0.0 07/24/2011 0540   BASOSABS 0.0 07/08/2011 1404      Lab 07/24/11 0540 07/23/11 0520 07/22/11 2215 07/22/11 0400 07/21/11 0500 07/20/11 0900 07/19/11 1350  WBC 2.9* 3.3* 3.7* 3.4* 2.5* -- --  HGB 9.0* 9.7* 9.4* 7.6* 7.4* -- --  HCT 26.2* 27.9* 26.5* 22.4* 21.2* -- --  PLT 134* 136* 120* 122* 122* -- --  MCV 89.4 89.4 89.5 93.7 93.0 -- --  MCH 30.7 31.1 31.8 31.8 32.5 -- --  MCHC 34.4 34.8 35.5 33.9 34.9 -- --  RDW 15.1 15.3 15.2 14.4 14.4 -- --  LYMPHSABS 0.1* -- -- -- -- 0.2* 0.2*  MONOABS 0.3 -- -- -- -- 0.2 0.2  EOSABS 0.1 -- -- -- -- 0.0 0.0  BASOSABS 0.0 -- -- -- -- 0.0 0.0  BANDABS -- -- -- -- -- -- --    Lab 07/24/11 0540 07/23/11 0520 07/22/11 0400 07/21/11 0500 07/20/11 0900  NA 136 132* 137 138 135  K 3.3* 3.5 4.0 3.3* 3.3*  CL 104 100 105 106 103  CO2 24 23 23  22 22  GLUCOSE 102* 87 88 96 101*  BUN 9 5* 4* 5* 4*  CREATININE 1.27* 1.05 1.01 1.06 0.62  CALCIUM 9.4 9.0 8.8 8.5 8.7  MG 2.5 -- 1.6 1.5 1.6   No results found for this basename: INR:5,PROTIME:5 in the last 168 hours Cardiac markers:  Lab 07/20/11 0900 07/20/11 0045 07/19/11 1350  CKMB 1.2 1.3 1.3  TROPONINI <0.30 <0.30 <0.30  MYOGLOBIN -- -- --   No components found with this basename: POCBNP:3 Recent Results (from the past 240 hour(s))  CULTURE, BLOOD (ROUTINE X 2)     Status: Normal (Preliminary result)   Collection Time   07/19/11  7:36 PM      Component Value Range Status Comment   Specimen Description BLOOD LEFT ARM   Final    Special Requests BOTTLES DRAWN AEROBIC AND ANAEROBIC 6CC   Final    Setup Time [id number]   Final    Culture     Final    Value:        BLOOD CULTURE RECEIVED NO GROWTH TO DATE CULTURE WILL BE HELD FOR 5 DAYS BEFORE ISSUING A FINAL NEGATIVE REPORT   Report Status PENDING   Incomplete   CULTURE, BLOOD (ROUTINE X 2)     Status: Normal (Preliminary result)    Collection Time   07/19/11  7:42 PM      Component Value Range Status Comment   Specimen Description BLOOD LEFT HAND   Final    Special Requests BOTTLES DRAWN AEROBIC AND ANAEROBIC 4CC   Final    Setup Time [id number]   Final    Culture     Final    Value:        BLOOD CULTURE RECEIVED NO GROWTH TO DATE CULTURE WILL BE HELD FOR 5 DAYS BEFORE ISSUING A FINAL NEGATIVE REPORT   Report Status PENDING   Incomplete   URINE CULTURE     Status: Normal   Collection Time   07/20/11  9:35 AM      Component Value Range Status Comment   Specimen Description URINE, CLEAN CATCH   Final    Special Requests NONE   Final    Setup Time [id number]   Final    Colony Count NO GROWTH   Final    Culture NO GROWTH   Final    Report Status 07/21/2011 FINAL   Final   URINE CULTURE     Status: Normal   Collection Time   07/23/11  9:12 AM      Component Value Range Status Comment   Specimen Description URINE, RANDOM   Final    Special Requests NONE   Final    Setup Time [id number]   Final    Colony Count NO GROWTH   Final    Culture NO GROWTH   Final    Report Status 07/24/2011 FINAL   Final   CULTURE, SPUTUM-ASSESSMENT     Status: Normal   Collection Time   07/23/11 11:24 AM      Component Value Range Status Comment   Specimen Description SPUTUM   Final    Special Requests NONE   Final    Sputum evaluation     Final    Value: THIS SPECIMEN IS ACCEPTABLE. RESPIRATORY CULTURE REPORT TO FOLLOW.   Report Status 07/23/2011 FINAL   Final   CULTURE, RESPIRATORY     Status: Normal (Preliminary result)   Collection Time   07/23/11 11:24 AM  Component Value Range Status Comment   Specimen Description SPUTUM   Final    Special Requests NONE   Final    Gram Stain     Final    Value: RARE WBC PRESENT,BOTH PMN AND MONONUCLEAR     FEW SQUAMOUS EPITHELIAL CELLS PRESENT     NO ORGANISMS SEEN   Culture NO GROWTH 1 DAY   Final    Report Status PENDING   Incomplete     Studies/Results: No results  found.  Medications: Scheduled Meds:   . albuterol  2.5 mg Nebulization BID  . benzonatate  100 mg Oral TID  . dextrose 5 % 100 mL with magnesium sulfate 3 g infusion   Intravenous Once  . docusate sodium  100 mg Oral BID  . enoxaparin  40 mg Subcutaneous Q24H  . gabapentin  100 mg Oral TID  . ipratropium  0.5 mg Nebulization BID  . levofloxacin (LEVAQUIN) IV  750 mg Intravenous Q24H  . levothyroxine  100 mcg Oral Q M,W,F  . levothyroxine  50 mcg Oral Q T,Th,S,Su  . lidocaine  4-5 mL Mouth/Throat Daily  . magnesium chloride  1 tablet Oral Daily  . oxyCODONE  20 mg Oral Q12H  . pantoprazole  40 mg Oral Daily  . piperacillin-tazobactam (ZOSYN)  IV  3.375 g Intravenous Q8H  . potassium chloride  10 mEq Intravenous Q1 Hr x 3  . vancomycin  750 mg Intravenous Q12H  . DISCONTD: vancomycin  1,000 mg Intravenous Q12H   Continuous Infusions:   . sodium chloride 75 mL/hr at 07/24/11 0925   PRN Meds:.acetaminophen, acetaminophen, albuterol, ALPRAZolam, alum & mag hydroxide-simeth, bisacodyl, emollient, guaiFENesin, HYDROcodone-acetaminophen, ibuprofen, ipratropium, lidocaine-prilocaine, magic mouthwash, meclizine, ondansetron (ZOFRAN) IV, ondansetron, prochlorperazine, senna-docusate, zolpidem  Assessment/Plan:  1. Healthcare associated pneumonia:  - clinically improving and no fevers overnight, cultures negative to date but no final report back yet - appreciate ID input - continue abx as noted above  2. Fever  - unknown etiology. Blood cultures pending. Urine cultures pending, urine legionella antigen pending.  - urine pneumococcus antigen pending - continue empiric IV vanc/Zosyn/Levaquin. ID consult in AM.   3. Pancytopenia:  - secondary to multiple myeloma and recent chemotherapy - oncology consulted.   4. Symtomatic anemia  - likely secondary to chemo.  - transfused 2 units PRBCs - CBC in AM  5. DVT prophylaxis with Lovenox and SCDs, monitor platelets closely    EDUCATION - test results and diagnostic studies were discussed with patient - patient verbalized the understanding - questions were answered at the bedside and contact information was provided for additional questions or concerns   LOS: 5 days   MAGICK-Xan Ingraham 07/24/2011, 8:40 PM  [facility] Pager: [phone]

## 2011-07-24 NOTE — Progress Notes (Signed)
Physical Therapy Treatment Patient Details Name: KAELEN BRENNAN MRN: [id number] DOB: 1950/07/16 Today's Date: 07/24/2011  PT Assessment/Plan  PT - Assessment/Plan Comments on Treatment Session: Patient reports no dizziness initially upon rising.  Rough morning with tryint to get fever down.  Little increased imbalance with ambulation and educated staying in bed could exacerbate symptoms, but understandable when fever is elevated. PT Plan: Discharge plan remains appropriate PT Frequency: Min 3X/week Follow Up Recommendations: Home health PT Equipment Recommended: None recommended by PT PT Goals  Acute Rehab PT Goals Pt will go Sit to Stand: Independently PT Goal: Sit to Stand - Progress: Met Pt will go Stand to Sit: Independently PT Goal: Stand to Sit - Progress: Met Pt will Ambulate: 51 - 150 feet;with modified independence PT Goal: Ambulate - Progress: Progressing toward goal Pt will Perform Home Exercise Program: with supervision, verbal cues required/provided PT Goal: Perform Home Exercise Program - Progress: Progressing toward goal  PT Treatment Precautions/Restrictions  Precautions Precautions: Fall Restrictions Weight Bearing Restrictions: No Mobility (including Balance) Bed Mobility Supine to Sit: 6: Modified independent (Device/Increase time) Transfers Sit to Stand: 6: Modified independent (Device/Increase time) Stand to Sit: 6: Modified independent (Device/Increase time) Ambulation/Gait Ambulation/Gait Assistance: 4: Min assist Ambulation/Gait Assistance Details (indicate cue type and reason): continue to cue patient for stationary target to improve dizziness and balance with gait. (she declined using walker to walk today) Assistive device: None Gait Pattern: Step-through pattern;Decreased stride length    Exercise  Other Exercises Other Exercises: Performed x 1 viewing exercise standing with UE support on RW 30 sec with horizontal and vertical head turns x 2  sessions with max dizziness 5/10 with head turns.  Issued writted handout for HEP and educated in importance to try with each trip to bathroom when assisted only. End of Session PT - End of Session Activity Tolerance: Patient limited by fatigue Patient left: in chair;with call bell in reach;with family/visitor present General Behavior During Session: [facility] for tasks performed Cognition: [facility] for tasks performed  [facility] 07/24/2011, 5:05 PM

## 2011-07-24 NOTE — Consult Note (Signed)
Infectious Diseases Initial Consultation  Reason for Consultation:  HCAP   HPI: [name] is a 61 y.o. female with multiple myeloma undergoing chemo and radiation therapy who presented to ED with SOB.  She actually reports subjective fever and chills over the last month and significant SOB, weakness and fatigue that particularly worsened several days prior to presentation.  Her last chemo was about 2 weeks ago.  She does also endorse some intermittent diarrhea.  Initial work up was consistent with HCAP based on CT scan, fever, chills and SOB and she was started on appropriate therapy.  Since admission though, she continues to experience fever and chills and concern for not improving despite therapy.    Past Medical History  Diagnosis Date  . Cancer multiple myeloma  . Thyroid disease Hypothyroidism  . Hypercholesterolemia   . Compression fracture 04/08/2007    pathologic compression fracture  . Hypothyroidism   . FHx: chemotherapy     s/p 5 cycle revlimid/low dose decadron,s/p velcade,doxil,decadron,  . Hx of radiation therapy 05/05/07-05/18/07,& 03/05/11-03/21/11-    l3&l5 in 2008, t2-t6 03/2011  . GERD (gastroesophageal reflux disease)   . Insomnia     associated with steroids  . Nausea   . Constipation     takes oxycontin,vicodin    Allergies: No Known Allergies  Current antibiotics:   MEDICATIONS:    . albuterol  2.5 mg Nebulization BID  . benzonatate  100 mg Oral TID  . dextrose 5 % 100 mL with magnesium sulfate 3 g infusion   Intravenous Once  . docusate sodium  100 mg Oral BID  . enoxaparin  40 mg Subcutaneous Q24H  . gabapentin  100 mg Oral TID  . ipratropium  0.5 mg Nebulization BID  . levofloxacin (LEVAQUIN) IV  750 mg Intravenous Q24H  . levothyroxine  100 mcg Oral Q M,W,F  . levothyroxine  50 mcg Oral Q T,Th,S,Su  . lidocaine  4-5 mL Mouth/Throat Daily  . magnesium chloride  1 tablet Oral Daily  . oxyCODONE  20 mg Oral Q12H  . pantoprazole  40 mg Oral  Daily  . piperacillin-tazobactam (ZOSYN)  IV  3.375 g Intravenous Q8H  . potassium chloride  10 mEq Intravenous Q1 Hr x 3  . vancomycin  1,000 mg Intravenous Q12H  . DISCONTD: magnesium sulfate 1 - 4 g bolus IVPB  3 g Intravenous Once    History  Substance Use Topics  . Smoking status: Former Smoker -- 0.2 packs/day for 1 years  . Smokeless tobacco: Not on file  . Alcohol Use:     Family History  Problem Relation Age of Onset  . Cancer Brother     lung ca  . Cancer Maternal Uncle     colon  . Cancer Maternal Grandmother     breast ca    Review of Systems - a 12 pt ROS was obtained and is negative except as per HPI.  OBJECTIVE: Temp:  [98.1 F (36.7 C)-102.3 F (39.1 C)] 98.1 F (36.7 C) (01/23 0526) Pulse Rate:  [84-90] 84  (01/23 0526) Resp:  [18-20] 18  (01/23 0526) BP: (111-134)/(67-77) 111/67 mmHg (01/23 0526) SpO2:  [91 %-97 %] 91 % (01/23 0526) Weight:  [172 lb 2.9 oz (78.1 kg)] 172 lb 2.9 oz (78.1 kg) (01/23 0500) General appearance: alert, cooperative, fatigued and mild distress Throat: lips, mucosa, and tongue normal; teeth and gums normal Resp: clear to auscultation bilaterally Cardio: regular rate and rhythm, S1, S2 normal, no murmur, click, rub  or gallop GI: soft, non-tender; bowel sounds normal; no masses,  no organomegaly Extremities: extremities normal, atraumatic, no cyanosis or edema Skin: Skin color, texture, turgor normal. No rashes or lesions  LABS: Results for orders placed during the hospital encounter of 07/19/11 (from the past 48 hour(s))  URINALYSIS, ROUTINE W REFLEX MICROSCOPIC     Status: Abnormal   Collection Time   07/22/11 11:31 AM      Component Value Range Comment   Color, Urine YELLOW  YELLOW     APPearance CLOUDY (*) CLEAR     Specific Gravity, Urine 1.010  1.005 - 1.030     pH 6.0  5.0 - 8.0     Glucose, UA NEGATIVE  NEGATIVE (mg/dL)    Hgb urine dipstick NEGATIVE  NEGATIVE     Bilirubin Urine NEGATIVE  NEGATIVE     Ketones,  ur NEGATIVE  NEGATIVE (mg/dL)    Protein, ur NEGATIVE  NEGATIVE (mg/dL)    Urobilinogen, UA 0.2  0.0 - 1.0 (mg/dL)    Nitrite NEGATIVE  NEGATIVE     Leukocytes, UA NEGATIVE  NEGATIVE  MICROSCOPIC NOT DONE ON URINES WITH NEGATIVE PROTEIN, BLOOD, LEUKOCYTES, NITRITE, OR GLUCOSE <1000 mg/dL.  CBC     Status: Abnormal   Collection Time   07/22/11 10:15 PM      Component Value Range Comment   WBC 3.7 (*) 4.0 - 10.5 (K/uL)    RBC 2.96 (*) 3.87 - 5.11 (MIL/uL)    Hemoglobin 9.4 (*) 12.0 - 15.0 (g/dL)    HCT 16.1 (*) 09.6 - 46.0 (%)    MCV 89.5  78.0 - 100.0 (fL)    MCH 31.8  26.0 - 34.0 (pg)    MCHC 35.5  30.0 - 36.0 (g/dL)    RDW 04.5  40.9 - 81.1 (%)    Platelets 120 (*) 150 - 400 (K/uL)   BASIC METABOLIC PANEL     Status: Abnormal   Collection Time   07/23/11  5:20 AM      Component Value Range Comment   Sodium 132 (*) 135 - 145 (mEq/L)    Potassium 3.5  3.5 - 5.1 (mEq/L)    Chloride 100  96 - 112 (mEq/L)    CO2 23  19 - 32 (mEq/L)    Glucose, Bld 87  70 - 99 (mg/dL)    BUN 5 (*) 6 - 23 (mg/dL)    Creatinine, Ser 9.14  0.50 - 1.10 (mg/dL)    Calcium 9.0  8.4 - 10.5 (mg/dL)    GFR calc non Af Amer 57 (*) >90 (mL/min)    GFR calc Af Amer 66 (*) >90 (mL/min)   CBC     Status: Abnormal   Collection Time   07/23/11  5:20 AM      Component Value Range Comment   WBC 3.3 (*) 4.0 - 10.5 (K/uL)    RBC 3.12 (*) 3.87 - 5.11 (MIL/uL)    Hemoglobin 9.7 (*) 12.0 - 15.0 (g/dL)    HCT 78.2 (*) 95.6 - 46.0 (%)    MCV 89.4  78.0 - 100.0 (fL)    MCH 31.1  26.0 - 34.0 (pg)    MCHC 34.8  30.0 - 36.0 (g/dL)    RDW 21.3  08.6 - 57.8 (%)    Platelets 136 (*) 150 - 400 (K/uL)   LEGIONELLA ANTIGEN, URINE     Status: Normal   Collection Time   07/23/11  9:12 AM      Component Value  Range Comment   Specimen Description URINE, RANDOM      Special Requests NONE      Legionella Antigen, Urine Negative for Legionella pneumophilia serogroup 1      Report Status 07/24/2011 FINAL     STREP PNEUMONIAE  URINARY ANTIGEN     Status: Normal   Collection Time   07/23/11  9:12 AM      Component Value Range Comment   Strep Pneumo Urinary Antigen NEGATIVE  NEGATIVE    CULTURE, SPUTUM-ASSESSMENT     Status: Normal   Collection Time   07/23/11 11:24 AM      Component Value Range Comment   Specimen Description SPUTUM      Special Requests NONE      Sputum evaluation        Value: THIS SPECIMEN IS ACCEPTABLE. RESPIRATORY CULTURE REPORT TO FOLLOW.   Report Status 07/23/2011 FINAL     CULTURE, RESPIRATORY     Status: Normal (Preliminary result)   Collection Time   07/23/11 11:24 AM      Component Value Range Comment   Specimen Description SPUTUM      Special Requests NONE      Gram Stain        Value: RARE WBC PRESENT,BOTH PMN AND MONONUCLEAR     FEW SQUAMOUS EPITHELIAL CELLS PRESENT     NO ORGANISMS SEEN   Culture NO GROWTH 1 DAY      Report Status PENDING     MAGNESIUM     Status: Normal   Collection Time   07/24/11  5:40 AM      Component Value Range Comment   Magnesium 2.5  1.5 - 2.5 (mg/dL)   BASIC METABOLIC PANEL     Status: Abnormal   Collection Time   07/24/11  5:40 AM      Component Value Range Comment   Sodium 136  135 - 145 (mEq/L)    Potassium 3.3 (*) 3.5 - 5.1 (mEq/L)    Chloride 104  96 - 112 (mEq/L)    CO2 24  19 - 32 (mEq/L)    Glucose, Bld 102 (*) 70 - 99 (mg/dL)    BUN 9  6 - 23 (mg/dL)    Creatinine, Ser 1.61 (*) 0.50 - 1.10 (mg/dL)    Calcium 9.4  8.4 - 10.5 (mg/dL)    GFR calc non Af Amer 45 (*) >90 (mL/min)    GFR calc Af Amer 52 (*) >90 (mL/min)   CBC     Status: Abnormal   Collection Time   07/24/11  5:40 AM      Component Value Range Comment   WBC 2.9 (*) 4.0 - 10.5 (K/uL)    RBC 2.93 (*) 3.87 - 5.11 (MIL/uL)    Hemoglobin 9.0 (*) 12.0 - 15.0 (g/dL)    HCT 09.6 (*) 04.5 - 46.0 (%)    MCV 89.4  78.0 - 100.0 (fL)    MCH 30.7  26.0 - 34.0 (pg)    MCHC 34.4  30.0 - 36.0 (g/dL)    RDW 40.9  81.1 - 91.4 (%)    Platelets 134 (*) 150 - 400 (K/uL)     DIFFERENTIAL     Status: Abnormal   Collection Time   07/24/11  5:40 AM      Component Value Range Comment   Neutrophils Relative 82 (*) 43 - 77 (%)    Neutro Abs 2.4  1.7 - 7.7 (K/uL)    Lymphocytes Relative 4 (*) 12 -  46 (%)    Lymphs Abs 0.1 (*) 0.7 - 4.0 (K/uL)    Monocytes Relative 11  3 - 12 (%)    Monocytes Absolute 0.3  0.1 - 1.0 (K/uL)    Eosinophils Relative 2  0 - 5 (%)    Eosinophils Absolute 0.1  0.0 - 0.7 (K/uL)    Basophils Relative 1  0 - 1 (%)    Basophils Absolute 0.0  0.0 - 0.1 (K/uL)     MICRO:  IMAGING: CT scan - multilobar airspace consolidation.   No results found.  HISTORICAL MICRO/IMAGING  Assessment/Plan:  61 yo immunosuppressed female with pneumonia.  1) HCAP - she is immunosuppressed and has frequent interaction with the health care system making this consistent with HCAP.  With the multilobar consolidation, the differential is broad and includes typical bacteria, mycobacteria, mycoplasma, fungal or modified acid fast organisms and all cultures are negative to date.  However, at this time, she has not had a fever since last night and overall tells me she symptomatically feels much better today.  Therefore I suspect that she is improving slowly and so would continue with the current antibiotics for now.  If she does not continue to improve though, I would recommend a BAL to check for bacteria, fungal, AFB and modified acid fast organisms (like nocardia) which would change treatment.

## 2011-07-25 LAB — CBC
HCT: 25.2 % — ABNORMAL LOW (ref 36.0–46.0)
Hemoglobin: 8.7 g/dL — ABNORMAL LOW (ref 12.0–15.0)
MCH: 31 pg (ref 26.0–34.0)
MCHC: 34.5 g/dL (ref 30.0–36.0)
RDW: 15.1 % (ref 11.5–15.5)

## 2011-07-25 LAB — BASIC METABOLIC PANEL
BUN: 8 mg/dL (ref 6–23)
Calcium: 9.2 mg/dL (ref 8.4–10.5)
GFR calc Af Amer: 48 mL/min — ABNORMAL LOW (ref >90)
GFR calc non Af Amer: 41 mL/min — ABNORMAL LOW (ref >90)
Glucose, Bld: 84 mg/dL (ref 70–99)
Sodium: 136 mEq/L (ref 135–145)

## 2011-07-25 MED ORDER — MORPHINE SULFATE 2 MG/ML IJ SOLN: 1.0000 mg | INTRAMUSCULAR | Status: DC | PRN

## 2011-07-25 MED ORDER — LEVOFLOXACIN IN D5W 750 MG/150ML IV SOLN
750.0000 mg | INTRAVENOUS | Status: DC
  Administered 2011-07-27 – 2011-07-31 (×3): 750 mg via INTRAVENOUS
  Filled 2011-07-25 (×4): qty 150

## 2011-07-25 MED ORDER — SODIUM CHLORIDE 0.9 % IJ SOLN
10.0000 mL | INTRAMUSCULAR | Status: DC | PRN
  Administered 2011-07-26 – 2011-08-01 (×9): 10 mL

## 2011-07-25 NOTE — Progress Notes (Signed)
   CARE MANAGEMENT NOTE 07/25/2011  Patient:  DAI, APEL   Account Number:  [id number]  Date Initiated:  07/22/2011  Documentation initiated by:  Lanier Clam  Subjective/Objective Assessment:   ADMITTED W/GENERALIZED Arnette Norris.HX: MULTIPLE MYELOMA.     Action/Plan:   FROM HOME W/SPOUSE.ONCOLOGY FOLLOWING.   Anticipated DC Date:  07/29/2011   Anticipated DC Plan:  HOME W HOME HEALTH SERVICES         Choice offered to / List presented to:  C-1 Patient   DME arranged  [facility]  3-N-1      DME agency  [facility]     HH arranged  HH-1 RN  HH-2 PT  HH-3 OT  HH-4 NURSE'S AIDE      HH agency  [facility]   Status of service:  In process, will continue to follow Medicare Important Message given?   (If response is "NO", the following Medicare IM given date fields will be blank) Date Medicare IM given:   Date Additional Medicare IM given:    Discharge Disposition:    Per UR Regulation:  Reviewed for med. necessity/level of care/duration of stay  Comments:  07/25/11 Kay Shippy RN,BSN NCM [phone] [facility] FOLLOWING FOR HH. WILL NEED ORDERS FOR RW,3N1 TO FAX TO [phone] WILL DELIVER TO HOSPITAL.  07/22/11 Almedia Cordell RN,BSN NCM [phone]

## 2011-07-25 NOTE — Progress Notes (Signed)
Patient ID: [name], female   DOB: [birth year], 61 y.o.   MRN: [id number]  Subjective: No events overnight. Patient denies chest pain, shortness of breath, abdominal pain at this moment but had one episode overnight. Also reports dry mouth.  Objective:  Vital signs in last 24 hours:  Filed Vitals:   07/24/11 2024 07/24/11 2146 07/25/11 0520 07/25/11 1438  BP:  108/61 111/64 136/78  Pulse:  101 82 90  Temp:  98.7 F (37.1 C) 98.4 F (36.9 C) 98.2 F (36.8 C)  TempSrc:  Oral Axillary Oral  Resp:  18 18 18   Height:      Weight:      SpO2: 93% 94% 100% 93%    Intake/Output from previous day:   Intake/Output Summary (Last 24 hours) at 07/25/11 1759 Last data filed at 07/25/11 0600  Gross per 24 hour  Intake 3114.58 ml  Output      0 ml  Net 3114.58 ml    Physical Exam: General: Alert, awake, oriented x3, in no acute distress. HEENT: No bruits, no goiter. Moist mucous membranes, no scleral icterus, no conjunctival pallor. Heart: Regular rate and rhythm, S1/S2 +, no murmurs, rubs, gallops. Lungs: Clear to auscultation bilaterally. No wheezing, no rhonchi, no rales.  Abdomen: Soft, nontender, nondistended, positive bowel sounds. Extremities: No clubbing or cyanosis, no pitting edema,  positive pedal pulses. Neuro: Grossly nonfocal.  Lab Results:  Basic Metabolic Panel:    Component Value Date/Time   NA 136 07/25/2011 0535   K 3.7 07/25/2011 0535   CL 106 07/25/2011 0535   CO2 23 07/25/2011 0535   BUN 8 07/25/2011 0535   CREATININE 1.37* 07/25/2011 0535   GLUCOSE 84 07/25/2011 0535   CALCIUM 9.2 07/25/2011 0535   CBC:    Component Value Date/Time   WBC 3.4* 07/25/2011 0535   WBC 3.1* 07/08/2011 1404   HGB 8.7* 07/25/2011 0535   HGB 10.0* 07/08/2011 1404   HCT 25.2* 07/25/2011 0535   HCT 29.1* 07/08/2011 1404   PLT 133* 07/25/2011 0535   PLT 102* 07/08/2011 1404   MCV 89.7 07/25/2011 0535   MCV 92.4 07/08/2011 1404   NEUTROABS 2.4 07/24/2011 0540   NEUTROABS 2.4 07/08/2011  1404   LYMPHSABS 0.1* 07/24/2011 0540   LYMPHSABS 0.2* 07/08/2011 1404   MONOABS 0.3 07/24/2011 0540   MONOABS 0.4 07/08/2011 1404   EOSABS 0.1 07/24/2011 0540   EOSABS 0.0 07/08/2011 1404   BASOSABS 0.0 07/24/2011 0540   BASOSABS 0.0 07/08/2011 1404      Lab 07/25/11 0535 07/24/11 0540 07/23/11 0520 07/22/11 2215 07/22/11 0400 07/20/11 0900 07/19/11 1350  WBC 3.4* 2.9* 3.3* 3.7* 3.4* -- --  HGB 8.7* 9.0* 9.7* 9.4* 7.6* -- --  HCT 25.2* 26.2* 27.9* 26.5* 22.4* -- --  PLT 133* 134* 136* 120* 122* -- --  MCV 89.7 89.4 89.4 89.5 93.7 -- --  MCH 31.0 30.7 31.1 31.8 31.8 -- --  MCHC 34.5 34.4 34.8 35.5 33.9 -- --  RDW 15.1 15.1 15.3 15.2 14.4 -- --  LYMPHSABS -- 0.1* -- -- -- 0.2* 0.2*  MONOABS -- 0.3 -- -- -- 0.2 0.2  EOSABS -- 0.1 -- -- -- 0.0 0.0  BASOSABS -- 0.0 -- -- -- 0.0 0.0  BANDABS -- -- -- -- -- -- --    Lab 07/25/11 0535 07/24/11 0540 07/23/11 0520 07/22/11 0400 07/21/11 0500 07/20/11 0900  NA 136 136 132* 137 138 --  K 3.7 3.3* 3.5 4.0 3.3* --  CL 106 104 100 105 106 --  CO2 23 24 23 23 22  --  GLUCOSE 84 102* 87 88 96 --  BUN 8 9 5* 4* 5* --  CREATININE 1.37* 1.27* 1.05 1.01 1.06 --  CALCIUM 9.2 9.4 9.0 8.8 8.5 --  MG -- 2.5 -- 1.6 1.5 1.6   No results found for this basename: INR:5,PROTIME:5 in the last 168 hours Cardiac markers:  Lab 07/20/11 0900 07/20/11 0045 07/19/11 1350  CKMB 1.2 1.3 1.3  TROPONINI <0.30 <0.30 <0.30  MYOGLOBIN -- -- --   No components found with this basename: POCBNP:3 Recent Results (from the past 240 hour(s))  CULTURE, BLOOD (ROUTINE X 2)     Status: Normal (Preliminary result)   Collection Time   07/19/11  7:36 PM      Component Value Range Status Comment   Specimen Description BLOOD LEFT ARM   Final    Special Requests BOTTLES DRAWN AEROBIC AND ANAEROBIC 6CC   Final    Setup Time [id number]   Final    Culture     Final    Value:        BLOOD CULTURE RECEIVED NO GROWTH TO DATE CULTURE WILL BE HELD FOR 5 DAYS BEFORE ISSUING A FINAL  NEGATIVE REPORT   Report Status PENDING   Incomplete   CULTURE, BLOOD (ROUTINE X 2)     Status: Normal (Preliminary result)   Collection Time   07/19/11  7:42 PM      Component Value Range Status Comment   Specimen Description BLOOD LEFT HAND   Final    Special Requests BOTTLES DRAWN AEROBIC AND ANAEROBIC 4CC   Final    Setup Time [id number]   Final    Culture     Final    Value:        BLOOD CULTURE RECEIVED NO GROWTH TO DATE CULTURE WILL BE HELD FOR 5 DAYS BEFORE ISSUING A FINAL NEGATIVE REPORT   Report Status PENDING   Incomplete   URINE CULTURE     Status: Normal   Collection Time   07/20/11  9:35 AM      Component Value Range Status Comment   Specimen Description URINE, CLEAN CATCH   Final    Special Requests NONE   Final    Setup Time [id number]   Final    Colony Count NO GROWTH   Final    Culture NO GROWTH   Final    Report Status 07/21/2011 FINAL   Final   URINE CULTURE     Status: Normal   Collection Time   07/23/11  9:12 AM      Component Value Range Status Comment   Specimen Description URINE, RANDOM   Final    Special Requests NONE   Final    Setup Time [id number]   Final    Colony Count NO GROWTH   Final    Culture NO GROWTH   Final    Report Status 07/24/2011 FINAL   Final   CULTURE, SPUTUM-ASSESSMENT     Status: Normal   Collection Time   07/23/11 11:24 AM      Component Value Range Status Comment   Specimen Description SPUTUM   Final    Special Requests NONE   Final    Sputum evaluation     Final    Value: THIS SPECIMEN IS ACCEPTABLE. RESPIRATORY CULTURE REPORT TO FOLLOW.   Report Status 07/23/2011 FINAL   Final   CULTURE, RESPIRATORY  Status: Normal   Collection Time   07/23/11 11:24 AM      Component Value Range Status Comment   Specimen Description SPUTUM   Final    Special Requests NONE   Final    Gram Stain     Final    Value: RARE WBC PRESENT,BOTH PMN AND MONONUCLEAR     FEW SQUAMOUS EPITHELIAL CELLS PRESENT     NO ORGANISMS SEEN   Culture  NORMAL OROPHARYNGEAL FLORA   Final    Report Status 07/25/2011 FINAL   Final   CULTURE, BLOOD (ROUTINE X 2)     Status: Normal (Preliminary result)   Collection Time   07/23/11  4:20 PM      Component Value Range Status Comment   Specimen Description BLOOD LEFT ARM   Final    Special Requests BOTTLES DRAWN AEROBIC AND ANAEROBIC 10CC   Final    Setup Time [id number]   Final    Culture     Final    Value:        BLOOD CULTURE RECEIVED NO GROWTH TO DATE CULTURE WILL BE HELD FOR 5 DAYS BEFORE ISSUING A FINAL NEGATIVE REPORT   Report Status PENDING   Incomplete   CULTURE, BLOOD (ROUTINE X 2)     Status: Normal (Preliminary result)   Collection Time   07/23/11  4:42 PM      Component Value Range Status Comment   Specimen Description BLOOD RIGHT ARM   Final    Special Requests BOTTLES DRAWN AEROBIC AND ANAEROBIC 10CC   Final    Setup Time [id number]   Final    Culture     Final    Value:        BLOOD CULTURE RECEIVED NO GROWTH TO DATE CULTURE WILL BE HELD FOR 5 DAYS BEFORE ISSUING A FINAL NEGATIVE REPORT   Report Status PENDING   Incomplete   URINE CULTURE     Status: Normal   Collection Time   07/24/11  7:23 PM      Component Value Range Status Comment   Specimen Description URINE, CLEAN CATCH   Final    Special Requests NONE   Final    Setup Time [id number]   Final    Colony Count NO GROWTH   Final    Culture NO GROWTH   Final    Report Status 07/25/2011 FINAL   Final     Studies/Results: No results found.  Medications: Scheduled Meds:   . albuterol  2.5 mg Nebulization BID  . benzonatate  100 mg Oral TID  . docusate sodium  100 mg Oral BID  . gabapentin  100 mg Oral TID  . ipratropium  0.5 mg Nebulization BID  . levofloxacin (LEVAQUIN) IV  750 mg Intravenous Q48H  . levothyroxine  100 mcg Oral Q M,W,F  . levothyroxine  50 mcg Oral Q T,Th,S,Su  . lidocaine  4-5 mL Mouth/Throat Daily  . magnesium chloride  1 tablet Oral Daily  . oxyCODONE  20 mg Oral Q12H  .  pantoprazole  40 mg Oral Daily  . piperacillin-tazobactam (ZOSYN)  IV  3.375 g Intravenous Q8H  . vancomycin  750 mg Intravenous Q12H  . DISCONTD: enoxaparin  40 mg Subcutaneous Q24H  . DISCONTD: levofloxacin (LEVAQUIN) IV  750 mg Intravenous Q24H  . DISCONTD: vancomycin  1,000 mg Intravenous Q12H   Continuous Infusions:   . sodium chloride 75 mL/hr at 07/25/11 0600   PRN Meds:.acetaminophen, acetaminophen, albuterol, ALPRAZolam, alum &  mag hydroxide-simeth, bisacodyl, emollient, guaiFENesin, HYDROcodone-acetaminophen, ipratropium, lidocaine-prilocaine, magic mouthwash, meclizine, morphine injection, ondansetron (ZOFRAN) IV, ondansetron, prochlorperazine, senna-docusate, zolpidem, DISCONTD: ibuprofen  Assessment/Plan:  1. Healthcare associated pneumonia:  - no significant clinical improvement since yesterday and one episode of Tmax > 101.5 F - appreciate ID input  - continue abx as noted above  - will call PCCM for further recommendation of pt's care  2. Fever  - unknown etiology. Blood cultures pending. Urine cultures pending, urine legionella antigen pending.  - urine pneumococcus antigen pending  - continue empiric IV Vanc/Zosyn/Levaquin.  3. Pancytopenia:  - secondary to multiple myeloma and recent chemotherapy  - remains stable to date  4. Symtomatic anemia  - likely secondary to chemo.  - transfused 2 units PRBCs  - CBC in AM   5. DVT prophylaxis with Lovenox and SCDs, monitor platelets closely   EDUCATION  - test results and diagnostic studies were discussed with patient  - patient verbalized the understanding  - questions were answered at the bedside and contact information was provided for additional questions or concerns    LOS: 6 days   MAGICK-Cheresa Siers 07/25/2011, 5:59 PM  [facility] Pager: [phone]

## 2011-07-25 NOTE — Progress Notes (Signed)
INFECTIOUS DISEASE PROGRESS NOTE  ID: [name] is a 61 y.o. female with  Pneumonia.  Subjective: She tells me she is improved and is eating now and actually can get to the bathroom without significant SOB.  However last night she experienced significant SOB and so appears to be waxing and waning.  She had another fever over the last 24 hours associated with chills.    Abtx:  Anti-infectives     Start     Dose/Rate Route Frequency Ordered Stop   07/27/11 1000   Levofloxacin (LEVAQUIN) IVPB 750 mg        750 mg 100 mL/hr over 90 Minutes Intravenous Every 48 hours 07/25/11 1006     07/24/11 2200   vancomycin (VANCOCIN) 750 mg in sodium chloride 0.9 % 150 mL IVPB        750 mg 150 mL/hr over 60 Minutes Intravenous Every 12 hours 07/24/11 2015     07/22/11 1030   Levofloxacin (LEVAQUIN) IVPB 750 mg  Status:  Discontinued        750 mg 100 mL/hr over 90 Minutes Intravenous Every 24 hours 07/22/11 0945 07/25/11 1006   07/20/11 0000   piperacillin-tazobactam (ZOSYN) IVPB 3.375 g  Status:  Discontinued        3.375 g 12.5 mL/hr over 240 Minutes Intravenous 4 times per day 07/19/11 2047 07/19/11 2115   07/19/11 2300   oseltamivir (TAMIFLU) capsule 75 mg  Status:  Discontinued        75 mg Oral 2 times daily 07/19/11 2047 07/21/11 1533   07/19/11 2000   vancomycin (VANCOCIN) IVPB 1000 mg/200 mL premix  Status:  Discontinued        1,000 mg 200 mL/hr over 60 Minutes Intravenous Every 12 hours 07/19/11 1931 07/24/11 2015   07/19/11 1415   cefTRIAXone (ROCEPHIN) 1 g in dextrose 5 % 50 mL IVPB        1 g 100 mL/hr over 30 Minutes Intravenous  Once 07/19/11 1407 07/19/11 1519   07/19/11 1415   azithromycin (ZITHROMAX) 500 mg in dextrose 5 % 250 mL IVPB        500 mg 250 mL/hr over 60 Minutes Intravenous  Once 07/19/11 1407 07/19/11 1636   07/19/11 0000  piperacillin-tazobactam (ZOSYN) IVPB 3.375 g       3.375 g 12.5 mL/hr over 240 Minutes Intravenous Every 8 hours 07/19/11 2116             Medications: I have reviewed the patient's current medications.  Objective: Vital signs in last 24 hours: Temp:  [98.4 F (36.9 C)-98.7 F (37.1 C)] 98.4 F (36.9 C) (01/24 0520) Pulse Rate:  [82-101] 82  (01/24 0520) Resp:  [18] 18  (01/24 0520) BP: (108-111)/(61-64) 111/64 mmHg (01/24 0520) SpO2:  [93 %-100 %] 100 % (01/24 0520)   General appearance: alert, cooperative and mild distress Resp: clear to auscultation bilaterally Cardio: regular rate and rhythm, S1, S2 normal, no murmur, click, rub or gallop GI: soft, non-tender; bowel sounds normal; no masses,  no organomegaly Extremities: extremities normal, atraumatic, no cyanosis or edema Skin: Skin color, texture, turgor normal. No rashes or lesions  Lab Results  Basename 07/25/11 0535 07/24/11 0540  WBC 3.4* 2.9*  HGB 8.7* 9.0*  HCT 25.2* 26.2*  NA 136 136  K 3.7 3.3*  CL 106 104  CO2 23 24  BUN 8 9  CREATININE 1.37* 1.27*  GLU -- --   Liver Panel No results found for this  basename: PROT:2,ALBUMIN:2,AST:2,ALT:2,ALKPHOS:2,BILITOT:2,BILIDIR:2,IBILI:2 in the last 72 hours Sedimentation Rate No results found for this basename: ESRSEDRATE in the last 72 hours C-Reactive Protein No results found for this basename: CRP:2 in the last 72 hours  Microbiology: Recent Results (from the past 240 hour(s))  CULTURE, BLOOD (ROUTINE X 2)     Status: Normal (Preliminary result)   Collection Time   07/19/11  7:36 PM      Component Value Range Status Comment   Specimen Description BLOOD LEFT ARM   Final    Special Requests BOTTLES DRAWN AEROBIC AND ANAEROBIC 6CC   Final    Setup Time [id number]   Final    Culture     Final    Value:        BLOOD CULTURE RECEIVED NO GROWTH TO DATE CULTURE WILL BE HELD FOR 5 DAYS BEFORE ISSUING A FINAL NEGATIVE REPORT   Report Status PENDING   Incomplete   CULTURE, BLOOD (ROUTINE X 2)     Status: Normal (Preliminary result)   Collection Time   07/19/11  7:42 PM      Component  Value Range Status Comment   Specimen Description BLOOD LEFT HAND   Final    Special Requests BOTTLES DRAWN AEROBIC AND ANAEROBIC 4CC   Final    Setup Time [id number]   Final    Culture     Final    Value:        BLOOD CULTURE RECEIVED NO GROWTH TO DATE CULTURE WILL BE HELD FOR 5 DAYS BEFORE ISSUING A FINAL NEGATIVE REPORT   Report Status PENDING   Incomplete   URINE CULTURE     Status: Normal   Collection Time   07/20/11  9:35 AM      Component Value Range Status Comment   Specimen Description URINE, CLEAN CATCH   Final    Special Requests NONE   Final    Setup Time [id number]   Final    Colony Count NO GROWTH   Final    Culture NO GROWTH   Final    Report Status 07/21/2011 FINAL   Final   URINE CULTURE     Status: Normal   Collection Time   07/23/11  9:12 AM      Component Value Range Status Comment   Specimen Description URINE, RANDOM   Final    Special Requests NONE   Final    Setup Time [id number]   Final    Colony Count NO GROWTH   Final    Culture NO GROWTH   Final    Report Status 07/24/2011 FINAL   Final   CULTURE, SPUTUM-ASSESSMENT     Status: Normal   Collection Time   07/23/11 11:24 AM      Component Value Range Status Comment   Specimen Description SPUTUM   Final    Special Requests NONE   Final    Sputum evaluation     Final    Value: THIS SPECIMEN IS ACCEPTABLE. RESPIRATORY CULTURE REPORT TO FOLLOW.   Report Status 07/23/2011 FINAL   Final   CULTURE, RESPIRATORY     Status: Normal   Collection Time   07/23/11 11:24 AM      Component Value Range Status Comment   Specimen Description SPUTUM   Final    Special Requests NONE   Final    Gram Stain     Final    Value: RARE WBC PRESENT,BOTH PMN AND MONONUCLEAR     FEW SQUAMOUS EPITHELIAL  CELLS PRESENT     NO ORGANISMS SEEN   Culture NORMAL OROPHARYNGEAL FLORA   Final    Report Status 07/25/2011 FINAL   Final   CULTURE, BLOOD (ROUTINE X 2)     Status: Normal (Preliminary result)   Collection Time   07/23/11   4:20 PM      Component Value Range Status Comment   Specimen Description BLOOD LEFT ARM   Final    Special Requests BOTTLES DRAWN AEROBIC AND ANAEROBIC 10CC   Final    Setup Time [id number]   Final    Culture     Final    Value:        BLOOD CULTURE RECEIVED NO GROWTH TO DATE CULTURE WILL BE HELD FOR 5 DAYS BEFORE ISSUING A FINAL NEGATIVE REPORT   Report Status PENDING   Incomplete   CULTURE, BLOOD (ROUTINE X 2)     Status: Normal (Preliminary result)   Collection Time   07/23/11  4:42 PM      Component Value Range Status Comment   Specimen Description BLOOD RIGHT ARM   Final    Special Requests BOTTLES DRAWN AEROBIC AND ANAEROBIC 10CC   Final    Setup Time [id number]   Final    Culture     Final    Value:        BLOOD CULTURE RECEIVED NO GROWTH TO DATE CULTURE WILL BE HELD FOR 5 DAYS BEFORE ISSUING A FINAL NEGATIVE REPORT   Report Status PENDING   Incomplete   URINE CULTURE     Status: Normal   Collection Time   07/24/11  7:23 PM      Component Value Range Status Comment   Specimen Description URINE, CLEAN CATCH   Final    Special Requests NONE   Final    Setup Time [id number]   Final    Colony Count NO GROWTH   Final    Culture NO GROWTH   Final    Report Status 07/25/2011 FINAL   Final     Studies/Results: No results found.   Assessment/Plan: 1) HCAP - she continues on broad spectrum antibiotics with some improvement.  I though am still concerned that she has no improved enough and if she is not significantly better tomorrow, I would recommend asking pulmonary to consider a BAL.  Otherwise continue with antibiotics and blood culture every 24 hours with fever.    Adger Cantera Infectious Diseases 07/25/2011, 2:05 PM

## 2011-07-25 NOTE — Progress Notes (Signed)
Patient chose [facility] for HH-Contacted Norberta Keens to follow. Recommend HH RN/PT/OT/RW, 3n1.

## 2011-07-25 NOTE — Progress Notes (Signed)
ANTIBIOTIC CONSULT NOTE    Levaquin Dosing  Labs:  Scr 1.37 (increased from 1.27), CG CrCl 45 ml/min  A/P:  For CrCl < 50 ml/min, will change Levaquin dosing to 750mg  IV q48 per protocol and continue to monitor for changes   Hessie Knows, PharmD, BCPS 07/25/2011 10:03 AM [phone]

## 2011-07-26 ENCOUNTER — Inpatient Hospital Stay (HOSPITAL_COMMUNITY): Payer: Medicare HMO

## 2011-07-26 DIAGNOSIS — J189 Pneumonia, unspecified organism: Secondary | ICD-10-CM

## 2011-07-26 LAB — CBC
HCT: 26.6 % — ABNORMAL LOW (ref 36.0–46.0)
Hemoglobin: 9.2 g/dL — ABNORMAL LOW (ref 12.0–15.0)
MCH: 31 pg (ref 26.0–34.0)
MCHC: 34.6 g/dL (ref 30.0–36.0)
MCV: 89.6 fL (ref 78.0–100.0)

## 2011-07-26 LAB — CULTURE, BLOOD (ROUTINE X 2): Culture  Setup Time: 201301190211

## 2011-07-26 LAB — BASIC METABOLIC PANEL
BUN: 7 mg/dL (ref 6–23)
Calcium: 9.2 mg/dL (ref 8.4–10.5)
GFR calc non Af Amer: 38 mL/min — ABNORMAL LOW (ref >90)
Glucose, Bld: 91 mg/dL (ref 70–99)

## 2011-07-26 NOTE — Progress Notes (Signed)
Occupational Therapy Treatment Patient Details Name: [name] MRN: [id number] DOB: 03-14-1951 Today's Date: 07/26/2011 1038 1059 1 ta OT Assessment/Plan OT Assessment/Plan OT Frequency: Min 2X/week Follow Up Recommendations: Home health OT;Other (comment) (with vestibular training) Equipment Recommended: None recommended by OT OT Goals ADL Goals Pt Will Transfer to Toilet: with supervision;Regular height toilet;Ambulation ADL Goal: Toilet Transfer - Progress: Progressing toward goals Miscellaneous OT Goals Miscellaneous OT Goal #1: pt will be independent with x1 adaptation exercises for left hypofunction, in supported sitting OT Goal: Miscellaneous Goal #1 - Progress: Met Miscellaneous OT Goal #2: pt will gather adl supplies with supervision, ambulating with ad prn Miscellaneous OT Goal #3 pt will be supervision with x1 adaptation exercises walking towards target with RW Goal set today  OT Treatment Precautions/Restrictions  Precautions Precautions: Fall Restrictions Weight Bearing Restrictions: No   ADL ADL Toilet Transfer: Performed;Minimal assistance;Other (comment) (min guard ambulating to bathroom) Toilet Transfer Method: Proofreader: Comfort height toilet;Grab bars Ambulation Related to ADLs: min guard with RW:  no LOB Mobility  Bed Mobility Supine to Sit: 6: Modified independent (Device/Increase time) Transfers Sit to Stand: 6: Modified independent (Device/Increase time) Exercises Other Exercises Other Exercises: Also head turns during walking to closet  End of Session OT - End of Session Activity Tolerance: Patient tolerated treatment well Patient left: in bed;with call bell in reach General Behavior During Session: [facility] for tasks performed Cognition: [facility] for tasks performed  [facility]  07/26/2011, 11:11 AM

## 2011-07-26 NOTE — Progress Notes (Signed)
Just faxed dme orders to [facility] for delivery.

## 2011-07-26 NOTE — Progress Notes (Signed)
Patient ID: [name], female   DOB: 08/29/1950, 61 y.o.   MRN: [id number] \ Subjective: No events overnight. Patient denies chest pain, shortness of breath, abdominal pain.   Objective:  Vital signs in last 24 hours:  Filed Vitals:   07/25/11 1438 07/25/11 2133 07/25/11 2230 07/26/11 0618  BP: 136/78  121/97 123/66  Pulse: 90  105 94  Temp: 98.2 F (36.8 C)  98.6 F (37 C) 100.1 F (37.8 C)  TempSrc: Oral  Oral Oral  Resp: 18  20 18   Height:      Weight:      SpO2: 93% 94% 92% 91%    Intake/Output from previous day:   Intake/Output Summary (Last 24 hours) at 07/26/11 1551 Last data filed at 07/26/11 0000  Gross per 24 hour  Intake    200 ml  Output      0 ml  Net    200 ml    Physical Exam: General: Alert, awake, oriented x3, in no acute distress. HEENT: No bruits, no goiter. Moist mucous membranes, no scleral icterus, no conjunctival pallor. Heart: Regular rate and rhythm, S1/S2 +, no murmurs, rubs, gallops. Lungs: Clear to auscultation bilaterally. No wheezing, no rhonchi, no rales.  Abdomen: Soft, nontender, nondistended, positive bowel sounds. Extremities: No clubbing or cyanosis, no pitting edema,  positive pedal pulses. Neuro: Grossly nonfocal.  Lab Results:  Basic Metabolic Panel:    Component Value Date/Time   NA 136 07/26/2011 0335   K 3.6 07/26/2011 0335   CL 106 07/26/2011 0335   CO2 21 07/26/2011 0335   BUN 7 07/26/2011 0335   CREATININE 1.45* 07/26/2011 0335   GLUCOSE 91 07/26/2011 0335   CALCIUM 9.2 07/26/2011 0335   CBC:    Component Value Date/Time   WBC 3.5* 07/26/2011 0335   WBC 3.1* 07/08/2011 1404   HGB 9.2* 07/26/2011 0335   HGB 10.0* 07/08/2011 1404   HCT 26.6* 07/26/2011 0335   HCT 29.1* 07/08/2011 1404   PLT 147* 07/26/2011 0335   PLT 102* 07/08/2011 1404   MCV 89.6 07/26/2011 0335   MCV 92.4 07/08/2011 1404   NEUTROABS 2.4 07/24/2011 0540   NEUTROABS 2.4 07/08/2011 1404   LYMPHSABS 0.1* 07/24/2011 0540   LYMPHSABS 0.2* 07/08/2011 1404   MONOABS 0.3 07/24/2011 0540   MONOABS 0.4 07/08/2011 1404   EOSABS 0.1 07/24/2011 0540   EOSABS 0.0 07/08/2011 1404   BASOSABS 0.0 07/24/2011 0540   BASOSABS 0.0 07/08/2011 1404      Lab 07/26/11 0335 07/25/11 0535 07/24/11 0540 07/23/11 0520 07/22/11 2215 07/20/11 0900  WBC 3.5* 3.4* 2.9* 3.3* 3.7* --  HGB 9.2* 8.7* 9.0* 9.7* 9.4* --  HCT 26.6* 25.2* 26.2* 27.9* 26.5* --  PLT 147* 133* 134* 136* 120* --  MCV 89.6 89.7 89.4 89.4 89.5 --  MCH 31.0 31.0 30.7 31.1 31.8 --  MCHC 34.6 34.5 34.4 34.8 35.5 --  RDW 15.0 15.1 15.1 15.3 15.2 --  LYMPHSABS -- -- 0.1* -- -- 0.2*  MONOABS -- -- 0.3 -- -- 0.2  EOSABS -- -- 0.1 -- -- 0.0  BASOSABS -- -- 0.0 -- -- 0.0  BANDABS -- -- -- -- -- --    Lab 07/26/11 0335 07/25/11 0535 07/24/11 0540 07/23/11 0520 07/22/11 0400 07/21/11 0500 07/20/11 0900  NA 136 136 136 132* 137 -- --  K 3.6 3.7 3.3* 3.5 4.0 -- --  CL 106 106 104 100 105 -- --  CO2 21 23 24 23 23  -- --  GLUCOSE 91 84 102* 87 88 -- --  BUN 7 8 9  5* 4* -- --  CREATININE 1.45* 1.37* 1.27* 1.05 1.01 -- --  CALCIUM 9.2 9.2 9.4 9.0 8.8 -- --  MG -- -- 2.5 -- 1.6 1.5 1.6   No results found for this basename: INR:5,PROTIME:5 in the last 168 hours Cardiac markers:  Lab 07/20/11 0900 07/20/11 0045  CKMB 1.2 1.3  TROPONINI <0.30 <0.30  MYOGLOBIN -- --   No components found with this basename: POCBNP:3 Recent Results (from the past 240 hour(s))  CULTURE, BLOOD (ROUTINE X 2)     Status: Normal   Collection Time   07/19/11  7:36 PM      Component Value Range Status Comment   Specimen Description BLOOD LEFT ARM   Final    Special Requests BOTTLES DRAWN AEROBIC AND ANAEROBIC [facility]   Final    Setup Time [id number]   Final    Culture NO GROWTH 5 DAYS   Final    Report Status 07/26/2011 FINAL   Final   CULTURE, BLOOD (ROUTINE X 2)     Status: Normal   Collection Time   07/19/11  7:42 PM      Component Value Range Status Comment   Specimen Description BLOOD LEFT HAND   Final    Special  Requests BOTTLES DRAWN AEROBIC AND ANAEROBIC 4CC   Final    Setup Time [id number]   Final    Culture NO GROWTH 5 DAYS   Final    Report Status 07/26/2011 FINAL   Final   URINE CULTURE     Status: Normal   Collection Time   07/20/11  9:35 AM      Component Value Range Status Comment   Specimen Description URINE, CLEAN CATCH   Final    Special Requests NONE   Final    Setup Time [id number]   Final    Colony Count NO GROWTH   Final    Culture NO GROWTH   Final    Report Status 07/21/2011 FINAL   Final   URINE CULTURE     Status: Normal   Collection Time   07/23/11  9:12 AM      Component Value Range Status Comment   Specimen Description URINE, RANDOM   Final    Special Requests NONE   Final    Setup Time [id number]   Final    Colony Count NO GROWTH   Final    Culture NO GROWTH   Final    Report Status 07/24/2011 FINAL   Final   CULTURE, SPUTUM-ASSESSMENT     Status: Normal   Collection Time   07/23/11 11:24 AM      Component Value Range Status Comment   Specimen Description SPUTUM   Final    Special Requests NONE   Final    Sputum evaluation     Final    Value: THIS SPECIMEN IS ACCEPTABLE. RESPIRATORY CULTURE REPORT TO FOLLOW.   Report Status 07/23/2011 FINAL   Final   CULTURE, RESPIRATORY     Status: Normal   Collection Time   07/23/11 11:24 AM      Component Value Range Status Comment   Specimen Description SPUTUM   Final    Special Requests NONE   Final    Gram Stain     Final    Value: RARE WBC PRESENT,BOTH PMN AND MONONUCLEAR     FEW SQUAMOUS EPITHELIAL CELLS PRESENT     NO ORGANISMS  SEEN   Culture NORMAL OROPHARYNGEAL FLORA   Final    Report Status 07/25/2011 FINAL   Final   CULTURE, BLOOD (ROUTINE X 2)     Status: Normal (Preliminary result)   Collection Time   07/23/11  4:20 PM      Component Value Range Status Comment   Specimen Description BLOOD LEFT ARM   Final    Special Requests BOTTLES DRAWN AEROBIC AND ANAEROBIC 10CC   Final    Setup Time [id number]    Final    Culture     Final    Value:        BLOOD CULTURE RECEIVED NO GROWTH TO DATE CULTURE WILL BE HELD FOR 5 DAYS BEFORE ISSUING A FINAL NEGATIVE REPORT   Report Status PENDING   Incomplete   CULTURE, BLOOD (ROUTINE X 2)     Status: Normal (Preliminary result)   Collection Time   07/23/11  4:42 PM      Component Value Range Status Comment   Specimen Description BLOOD RIGHT ARM   Final    Special Requests BOTTLES DRAWN AEROBIC AND ANAEROBIC 10CC   Final    Setup Time [id number]   Final    Culture     Final    Value:        BLOOD CULTURE RECEIVED NO GROWTH TO DATE CULTURE WILL BE HELD FOR 5 DAYS BEFORE ISSUING A FINAL NEGATIVE REPORT   Report Status PENDING   Incomplete   CULTURE, BLOOD (ROUTINE X 2)     Status: Normal (Preliminary result)   Collection Time   07/24/11  5:10 PM      Component Value Range Status Comment   Specimen Description BLOOD LEFT HAND   Final    Special Requests BOTTLES DRAWN AEROBIC AND ANAEROBIC 10 CC EACH   Final    Setup Time [id number]   Final    Culture     Final    Value:        BLOOD CULTURE RECEIVED NO GROWTH TO DATE CULTURE WILL BE HELD FOR 5 DAYS BEFORE ISSUING A FINAL NEGATIVE REPORT   Report Status PENDING   Incomplete   CULTURE, BLOOD (ROUTINE X 2)     Status: Normal (Preliminary result)   Collection Time   07/24/11  5:20 PM      Component Value Range Status Comment   Specimen Description BLOOD LEFT ARM   Final    Special Requests BOTTLES DRAWN AEROBIC AND ANAEROBIC 10 CC EACH   Final    Setup Time [id number]   Final    Culture     Final    Value:        BLOOD CULTURE RECEIVED NO GROWTH TO DATE CULTURE WILL BE HELD FOR 5 DAYS BEFORE ISSUING A FINAL NEGATIVE REPORT   Report Status PENDING   Incomplete   URINE CULTURE     Status: Normal   Collection Time   07/24/11  7:23 PM      Component Value Range Status Comment   Specimen Description URINE, CLEAN CATCH   Final    Special Requests NONE   Final    Setup Time [id number]   Final     Colony Count NO GROWTH   Final    Culture NO GROWTH   Final    Report Status 07/25/2011 FINAL   Final     Studies/Results: No results found.  Medications: Scheduled Meds:   . albuterol  2.5 mg Nebulization BID  .  benzonatate  100 mg Oral TID  . docusate sodium  100 mg Oral BID  . gabapentin  100 mg Oral TID  . ipratropium  0.5 mg Nebulization BID  . levofloxacin (LEVAQUIN) IV  750 mg Intravenous Q48H  . levothyroxine  100 mcg Oral Q M,W,F  . levothyroxine  50 mcg Oral Q T,Th,S,Su  . lidocaine  4-5 mL Mouth/Throat Daily  . magnesium chloride  1 tablet Oral Daily  . oxyCODONE  20 mg Oral Q12H  . pantoprazole  40 mg Oral Daily  . DISCONTD: piperacillin-tazobactam (ZOSYN)  IV  3.375 g Intravenous Q8H  . DISCONTD: vancomycin  750 mg Intravenous Q12H   Continuous Infusions:   . sodium chloride 75 mL/hr at 07/25/11 0600   PRN Meds:.acetaminophen, acetaminophen, albuterol, ALPRAZolam, alum & mag hydroxide-simeth, bisacodyl, emollient, guaiFENesin, HYDROcodone-acetaminophen, ipratropium, lidocaine-prilocaine, magic mouthwash, meclizine, morphine injection, ondansetron (ZOFRAN) IV, ondansetron, prochlorperazine, senna-docusate, sodium chloride, zolpidem  Assessment/Plan:  1. Healthcare associated pneumonia:  - clinical improvement since yesterday and one episode of Tmax > 101 F  - appreciate ID input  - continue abx as noted above  - will call PCCM for further recommendation of pt's care   2. Fever  - unknown etiology. Blood cultures pending. Urine cultures pending, urine legionella antigen pending.  - urine pneumococcus antigen pending  - continue Levaquin and switch to PO if afebrile  3. Pancytopenia:  - secondary to multiple myeloma and recent chemotherapy  - remains stable to date   4. Symtomatic anemia  - likely secondary to chemo.  - transfused 2 units PRBCs  - CBC in AM   5. DVT prophylaxis with Lovenox and SCDs, monitor platelets closely   EDUCATION  - test  results and diagnostic studies were discussed with patient  - patient verbalized the understanding  - questions were answered at the bedside and contact information was provided for additional questions or concerns    LOS: 7 days   MAGICK-Kourtni Stineman 07/26/2011, 3:51 PM  [facility] Pager: [phone]

## 2011-07-26 NOTE — Progress Notes (Signed)
INFECTIOUS DISEASE PROGRESS NOTE  ID: [name] is a 61 y.o. female with multiple myeloma and here with pneumonia.  Subjective: Patient continues to feel better and is actually walking outside of the room with much less SOB with exertion. Tmax is actually 100.1 over the last 24 hours, though she does report some chills persisting.    Abtx:  Anti-infectives     Start     Dose/Rate Route Frequency Ordered Stop   07/27/11 1000   Levofloxacin (LEVAQUIN) IVPB 750 mg        750 mg 100 mL/hr over 90 Minutes Intravenous Every 48 hours 07/25/11 1006     07/24/11 2200   vancomycin (VANCOCIN) 750 mg in sodium chloride 0.9 % 150 mL IVPB        750 mg 150 mL/hr over 60 Minutes Intravenous Every 12 hours 07/24/11 2015     07/22/11 1030   Levofloxacin (LEVAQUIN) IVPB 750 mg  Status:  Discontinued        750 mg 100 mL/hr over 90 Minutes Intravenous Every 24 hours 07/22/11 0945 07/25/11 1006   07/20/11 0000   piperacillin-tazobactam (ZOSYN) IVPB 3.375 g  Status:  Discontinued        3.375 g 12.5 mL/hr over 240 Minutes Intravenous 4 times per day 07/19/11 2047 07/19/11 2115   07/19/11 2300   oseltamivir (TAMIFLU) capsule 75 mg  Status:  Discontinued        75 mg Oral 2 times daily 07/19/11 2047 07/21/11 1533   07/19/11 2000   vancomycin (VANCOCIN) IVPB 1000 mg/200 mL premix  Status:  Discontinued        1,000 mg 200 mL/hr over 60 Minutes Intravenous Every 12 hours 07/19/11 1931 07/24/11 2015   07/19/11 1415   cefTRIAXone (ROCEPHIN) 1 g in dextrose 5 % 50 mL IVPB        1 g 100 mL/hr over 30 Minutes Intravenous  Once 07/19/11 1407 07/19/11 1519   07/19/11 1415   azithromycin (ZITHROMAX) 500 mg in dextrose 5 % 250 mL IVPB        500 mg 250 mL/hr over 60 Minutes Intravenous  Once 07/19/11 1407 07/19/11 1636   07/19/11 0000  piperacillin-tazobactam (ZOSYN) IVPB 3.375 g       3.375 g 12.5 mL/hr over 240 Minutes Intravenous Every 8 hours 07/19/11 2116            Medications: I have  reviewed the patient's current medications.  Objective: Vital signs in last 24 hours: Temp:  [98.2 F (36.8 C)-100.1 F (37.8 C)] 100.1 F (37.8 C) (01/25 0618) Pulse Rate:  [90-105] 94  (01/25 0618) Resp:  [18-20] 18  (01/25 0618) BP: (121-136)/(66-97) 123/66 mmHg (01/25 0618) SpO2:  [91 %-94 %] 91 % (01/25 0618)   General appearance: alert, cooperative and no distress Resp: clear to auscultation bilaterally Cardio: regular rate and rhythm, S1, S2 normal, no murmur, click, rub or gallop GI: soft, non-tender; bowel sounds normal; no masses,  no organomegaly Extremities: extremities normal, atraumatic, no cyanosis or edema Skin: Skin color, texture, turgor normal. No rashes or lesions or port site without erythema  Lab Results  Basename 07/26/11 0335 07/25/11 0535  WBC 3.5* 3.4*  HGB 9.2* 8.7*  HCT 26.6* 25.2*  NA 136 136  K 3.6 3.7  CL 106 106  CO2 21 23  BUN 7 8  CREATININE 1.45* 1.37*  GLU -- --   Liver Panel No results found for this basename: PROT:2,ALBUMIN:2,AST:2,ALT:2,ALKPHOS:2,BILITOT:2,BILIDIR:2,IBILI:2 in the last  72 hours Sedimentation Rate No results found for this basename: ESRSEDRATE in the last 72 hours C-Reactive Protein No results found for this basename: CRP:2 in the last 72 hours  Microbiology: Recent Results (from the past 240 hour(s))  CULTURE, BLOOD (ROUTINE X 2)     Status: Normal   Collection Time   07/19/11  7:36 PM      Component Value Range Status Comment   Specimen Description BLOOD LEFT ARM   Final    Special Requests BOTTLES DRAWN AEROBIC AND ANAEROBIC [facility]   Final    Setup Time [id number]   Final    Culture NO GROWTH 5 DAYS   Final    Report Status 07/26/2011 FINAL   Final   CULTURE, BLOOD (ROUTINE X 2)     Status: Normal   Collection Time   07/19/11  7:42 PM      Component Value Range Status Comment   Specimen Description BLOOD LEFT HAND   Final    Special Requests BOTTLES DRAWN AEROBIC AND ANAEROBIC 4CC   Final    Setup Time  [id number]   Final    Culture NO GROWTH 5 DAYS   Final    Report Status 07/26/2011 FINAL   Final   URINE CULTURE     Status: Normal   Collection Time   07/20/11  9:35 AM      Component Value Range Status Comment   Specimen Description URINE, CLEAN CATCH   Final    Special Requests NONE   Final    Setup Time [id number]   Final    Colony Count NO GROWTH   Final    Culture NO GROWTH   Final    Report Status 07/21/2011 FINAL   Final   URINE CULTURE     Status: Normal   Collection Time   07/23/11  9:12 AM      Component Value Range Status Comment   Specimen Description URINE, RANDOM   Final    Special Requests NONE   Final    Setup Time [id number]   Final    Colony Count NO GROWTH   Final    Culture NO GROWTH   Final    Report Status 07/24/2011 FINAL   Final   CULTURE, SPUTUM-ASSESSMENT     Status: Normal   Collection Time   07/23/11 11:24 AM      Component Value Range Status Comment   Specimen Description SPUTUM   Final    Special Requests NONE   Final    Sputum evaluation     Final    Value: THIS SPECIMEN IS ACCEPTABLE. RESPIRATORY CULTURE REPORT TO FOLLOW.   Report Status 07/23/2011 FINAL   Final   CULTURE, RESPIRATORY     Status: Normal   Collection Time   07/23/11 11:24 AM      Component Value Range Status Comment   Specimen Description SPUTUM   Final    Special Requests NONE   Final    Gram Stain     Final    Value: RARE WBC PRESENT,BOTH PMN AND MONONUCLEAR     FEW SQUAMOUS EPITHELIAL CELLS PRESENT     NO ORGANISMS SEEN   Culture NORMAL OROPHARYNGEAL FLORA   Final    Report Status 07/25/2011 FINAL   Final   CULTURE, BLOOD (ROUTINE X 2)     Status: Normal (Preliminary result)   Collection Time   07/23/11  4:20 PM      Component Value Range Status Comment  Specimen Description BLOOD LEFT ARM   Final    Special Requests BOTTLES DRAWN AEROBIC AND ANAEROBIC 10CC   Final    Setup Time [id number]   Final    Culture     Final    Value:        BLOOD CULTURE RECEIVED NO  GROWTH TO DATE CULTURE WILL BE HELD FOR 5 DAYS BEFORE ISSUING A FINAL NEGATIVE REPORT   Report Status PENDING   Incomplete   CULTURE, BLOOD (ROUTINE X 2)     Status: Normal (Preliminary result)   Collection Time   07/23/11  4:42 PM      Component Value Range Status Comment   Specimen Description BLOOD RIGHT ARM   Final    Special Requests BOTTLES DRAWN AEROBIC AND ANAEROBIC 10CC   Final    Setup Time [id number]   Final    Culture     Final    Value:        BLOOD CULTURE RECEIVED NO GROWTH TO DATE CULTURE WILL BE HELD FOR 5 DAYS BEFORE ISSUING A FINAL NEGATIVE REPORT   Report Status PENDING   Incomplete   CULTURE, BLOOD (ROUTINE X 2)     Status: Normal (Preliminary result)   Collection Time   07/24/11  5:10 PM      Component Value Range Status Comment   Specimen Description BLOOD LEFT HAND   Final    Special Requests BOTTLES DRAWN AEROBIC AND ANAEROBIC 10 CC EACH   Final    Setup Time [id number]   Final    Culture     Final    Value:        BLOOD CULTURE RECEIVED NO GROWTH TO DATE CULTURE WILL BE HELD FOR 5 DAYS BEFORE ISSUING A FINAL NEGATIVE REPORT   Report Status PENDING   Incomplete   CULTURE, BLOOD (ROUTINE X 2)     Status: Normal (Preliminary result)   Collection Time   07/24/11  5:20 PM      Component Value Range Status Comment   Specimen Description BLOOD LEFT ARM   Final    Special Requests BOTTLES DRAWN AEROBIC AND ANAEROBIC 10 CC EACH   Final    Setup Time [id number]   Final    Culture     Final    Value:        BLOOD CULTURE RECEIVED NO GROWTH TO DATE CULTURE WILL BE HELD FOR 5 DAYS BEFORE ISSUING A FINAL NEGATIVE REPORT   Report Status PENDING   Incomplete   URINE CULTURE     Status: Normal   Collection Time   07/24/11  7:23 PM      Component Value Range Status Comment   Specimen Description URINE, CLEAN CATCH   Final    Special Requests NONE   Final    Setup Time [id number]   Final    Colony Count NO GROWTH   Final    Culture NO GROWTH   Final    Report  Status 07/25/2011 FINAL   Final     Studies/Results: No results found.   Assessment/Plan: 1) HCAP - I think she has really improved and all cultures remain negative.  I think at this point, her antibiotics can be tapered to levaquin, which will cover bacterial and atypical organisms and she can be watched.  If she continues to remain afebrile, she can be switched to po.    Dr. Drue Second is on over the weekend for any issues, if needed.  Ciarra Braddy Infectious Diseases 07/26/2011, 10:53 AM

## 2011-07-27 LAB — BASIC METABOLIC PANEL
BUN: 7 mg/dL (ref 6–23)
CO2: 22 mEq/L (ref 19–32)
Calcium: 9 mg/dL (ref 8.4–10.5)
Creatinine, Ser: 1.35 mg/dL — ABNORMAL HIGH (ref 0.50–1.10)

## 2011-07-27 LAB — CBC
HCT: 24.8 % — ABNORMAL LOW (ref 36.0–46.0)
MCH: 31 pg (ref 26.0–34.0)
MCV: 89.5 fL (ref 78.0–100.0)
Platelets: 130 10*3/uL — ABNORMAL LOW (ref 150–400)
RBC: 2.77 MIL/uL — ABNORMAL LOW (ref 3.87–5.11)
RDW: 14.9 % (ref 11.5–15.5)

## 2011-07-27 MED ORDER — PIPERACILLIN-TAZOBACTAM 3.375 G IVPB
3.3750 g | Freq: Three times a day (TID) | INTRAVENOUS | Status: DC
  Administered 2011-07-27 – 2011-07-31 (×13): 3.375 g via INTRAVENOUS
  Filled 2011-07-27 (×15): qty 50

## 2011-07-27 MED ORDER — PIPERACILLIN-TAZOBACTAM IN DEX 2-0.25 GM/50ML IV SOLN
2.2500 g | Freq: Two times a day (BID) | INTRAVENOUS | Status: DC
  Filled 2011-07-27 (×3): qty 50

## 2011-07-27 MED ORDER — VANCOMYCIN HCL 1000 MG IV SOLR
750.0000 mg | Freq: Two times a day (BID) | INTRAVENOUS | Status: DC
  Administered 2011-07-27 – 2011-07-31 (×9): 750 mg via INTRAVENOUS
  Filled 2011-07-27 (×11): qty 750

## 2011-07-27 MED ORDER — VANCOMYCIN HCL IN DEXTROSE 1-5 GM/200ML-% IV SOLN
1000.0000 mg | Freq: Two times a day (BID) | INTRAVENOUS | Status: DC
  Filled 2011-07-27 (×2): qty 200

## 2011-07-27 NOTE — Progress Notes (Signed)
ANTIBIOTIC CONSULT NOTE - INITIAL  Pharmacy Consult for: Vancomycin/Zosyn Indication: Continued fevers (restarting for HCAP)  No Known Allergies  Patient Measurements: Height: 5\' 5"  (165.1 cm) Weight: 172 lb 2.9 oz (78.1 kg) IBW/kg (Calculated) : 57    Vital Signs: Temp: 98.4 F (36.9 C) (01/26 0530) Temp src: Oral (01/26 0530) BP: 118/70 mmHg (01/26 0530) Pulse Rate: 81  (01/26 0530) Intake/Output from previous day: 01/25 0701 - 01/26 0700 In: 480 [P.O.:480] Out: -  Intake/Output from this shift:    Labs:  Basename 07/27/11 0457 07/26/11 0335 07/25/11 0535  WBC 3.6* 3.5* 3.4*  HGB 8.6* 9.2* 8.7*  PLT 130* 147* 133*  LABCREA -- -- --  CREATININE 1.35* 1.45* 1.37*   Estimated Creatinine Clearance: 45.8 ml/min (by C-G formula based on Cr of 1.35).  Basename 07/26/11 0907 07/24/11 1812  VANCOTROUGH 23.9* 25.2*  VANCOPEAK -- --  Drue Dun -- --  GENTTROUGH -- --  GENTPEAK -- --  GENTRANDOM -- --  TOBRATROUGH -- --  TOBRAPEAK -- --  TOBRARND -- --  AMIKACINPEAK -- --  AMIKACINTROU -- --  AMIKACIN -- --     Microbiology: Recent Results (from the past 720 hour(s))  TECHNOLOGIST REVIEW     Status: Normal   Collection Time   07/04/11  8:42 AM      Component Value Range Status Comment   Technologist Review Occ Metas and Myelocytes present   Final   CULTURE, BLOOD (ROUTINE X 2)     Status: Normal   Collection Time   07/19/11  7:36 PM      Component Value Range Status Comment   Specimen Description BLOOD LEFT ARM   Final    Special Requests BOTTLES DRAWN AEROBIC AND ANAEROBIC [facility]   Final    Setup Time [id number]   Final    Culture NO GROWTH 5 DAYS   Final    Report Status 07/26/2011 FINAL   Final   CULTURE, BLOOD (ROUTINE X 2)     Status: Normal   Collection Time   07/19/11  7:42 PM      Component Value Range Status Comment   Specimen Description BLOOD LEFT HAND   Final    Special Requests BOTTLES DRAWN AEROBIC AND ANAEROBIC 4CC   Final    Setup Time  [id number]   Final    Culture NO GROWTH 5 DAYS   Final    Report Status 07/26/2011 FINAL   Final   URINE CULTURE     Status: Normal   Collection Time   07/20/11  9:35 AM      Component Value Range Status Comment   Specimen Description URINE, CLEAN CATCH   Final    Special Requests NONE   Final    Setup Time [id number]   Final    Colony Count NO GROWTH   Final    Culture NO GROWTH   Final    Report Status 07/21/2011 FINAL   Final   URINE CULTURE     Status: Normal   Collection Time   07/23/11  9:12 AM      Component Value Range Status Comment   Specimen Description URINE, RANDOM   Final    Special Requests NONE   Final    Setup Time [id number]   Final    Colony Count NO GROWTH   Final    Culture NO GROWTH   Final    Report Status 07/24/2011 FINAL   Final   CULTURE, SPUTUM-ASSESSMENT  Status: Normal   Collection Time   07/23/11 11:24 AM      Component Value Range Status Comment   Specimen Description SPUTUM   Final    Special Requests NONE   Final    Sputum evaluation     Final    Value: THIS SPECIMEN IS ACCEPTABLE. RESPIRATORY CULTURE REPORT TO FOLLOW.   Report Status 07/23/2011 FINAL   Final   CULTURE, RESPIRATORY     Status: Normal   Collection Time   07/23/11 11:24 AM      Component Value Range Status Comment   Specimen Description SPUTUM   Final    Special Requests NONE   Final    Gram Stain     Final    Value: RARE WBC PRESENT,BOTH PMN AND MONONUCLEAR     FEW SQUAMOUS EPITHELIAL CELLS PRESENT     NO ORGANISMS SEEN   Culture NORMAL OROPHARYNGEAL FLORA   Final    Report Status 07/25/2011 FINAL   Final   CULTURE, BLOOD (ROUTINE X 2)     Status: Normal (Preliminary result)   Collection Time   07/23/11  4:20 PM      Component Value Range Status Comment   Specimen Description BLOOD LEFT ARM   Final    Special Requests BOTTLES DRAWN AEROBIC AND ANAEROBIC 10CC   Final    Setup Time [id number]   Final    Culture     Final    Value:        BLOOD CULTURE RECEIVED NO  GROWTH TO DATE CULTURE WILL BE HELD FOR 5 DAYS BEFORE ISSUING A FINAL NEGATIVE REPORT   Report Status PENDING   Incomplete   CULTURE, BLOOD (ROUTINE X 2)     Status: Normal (Preliminary result)   Collection Time   07/23/11  4:42 PM      Component Value Range Status Comment   Specimen Description BLOOD RIGHT ARM   Final    Special Requests BOTTLES DRAWN AEROBIC AND ANAEROBIC 10CC   Final    Setup Time [id number]   Final    Culture     Final    Value:        BLOOD CULTURE RECEIVED NO GROWTH TO DATE CULTURE WILL BE HELD FOR 5 DAYS BEFORE ISSUING A FINAL NEGATIVE REPORT   Report Status PENDING   Incomplete   CULTURE, BLOOD (ROUTINE X 2)     Status: Normal (Preliminary result)   Collection Time   07/24/11  5:10 PM      Component Value Range Status Comment   Specimen Description BLOOD LEFT HAND   Final    Special Requests BOTTLES DRAWN AEROBIC AND ANAEROBIC 10 CC EACH   Final    Setup Time [id number]   Final    Culture     Final    Value:        BLOOD CULTURE RECEIVED NO GROWTH TO DATE CULTURE WILL BE HELD FOR 5 DAYS BEFORE ISSUING A FINAL NEGATIVE REPORT   Report Status PENDING   Incomplete   CULTURE, BLOOD (ROUTINE X 2)     Status: Normal (Preliminary result)   Collection Time   07/24/11  5:20 PM      Component Value Range Status Comment   Specimen Description BLOOD LEFT ARM   Final    Special Requests BOTTLES DRAWN AEROBIC AND ANAEROBIC 10 CC EACH   Final    Setup Time [id number]   Final    Culture  Final    Value:        BLOOD CULTURE RECEIVED NO GROWTH TO DATE CULTURE WILL BE HELD FOR 5 DAYS BEFORE ISSUING A FINAL NEGATIVE REPORT   Report Status PENDING   Incomplete   URINE CULTURE     Status: Normal   Collection Time   07/24/11  7:23 PM      Component Value Range Status Comment   Specimen Description URINE, CLEAN CATCH   Final    Special Requests NONE   Final    Setup Time [id number]   Final    Colony Count NO GROWTH   Final    Culture NO GROWTH   Final    Report  Status 07/25/2011 FINAL   Final     Medical History: Past Medical History  Diagnosis Date  . Cancer multiple myeloma  . Thyroid disease Hypothyroidism  . Hypercholesterolemia   . Compression fracture 04/08/2007    pathologic compression fracture  . Hypothyroidism   . FHx: chemotherapy     s/p 5 cycle revlimid/low dose decadron,s/p velcade,doxil,decadron,  . Hx of radiation therapy 05/05/07-05/18/07,& 03/05/11-03/21/11-    l3&l5 in 2008, t2-t6 03/2011  . GERD (gastroesophageal reflux disease)   . Insomnia     associated with steroids  . Nausea   . Constipation     takes oxycontin,vicodin    Medications:  Scheduled:    . albuterol  2.5 mg Nebulization BID  . benzonatate  100 mg Oral TID  . docusate sodium  100 mg Oral BID  . gabapentin  100 mg Oral TID  . ipratropium  0.5 mg Nebulization BID  . levofloxacin (LEVAQUIN) IV  750 mg Intravenous Q48H  . levothyroxine  100 mcg Oral Q M,W,F  . levothyroxine  50 mcg Oral Q T,Th,S,Su  . lidocaine  4-5 mL Mouth/Throat Daily  . magnesium chloride  1 tablet Oral Daily  . oxyCODONE  20 mg Oral Q12H  . pantoprazole  40 mg Oral Daily  . piperacillin-tazobactam (ZOSYN)  IV  3.375 g Intravenous Q8H  . vancomycin  750 mg Intravenous Q12H  . DISCONTD: piperacillin-tazobactam (ZOSYN)  IV  2.25 g Intravenous Q12H  . DISCONTD: piperacillin-tazobactam (ZOSYN)  IV  3.375 g Intravenous Q8H  . DISCONTD: vancomycin  750 mg Intravenous Q12H  . DISCONTD: vancomycin  1,000 mg Intravenous Q12H   Infusions:    . sodium chloride 75 mL/hr at 07/27/11 0932   PRN: acetaminophen, acetaminophen, albuterol, ALPRAZolam, alum & mag hydroxide-simeth, bisacodyl, emollient, guaiFENesin, HYDROcodone-acetaminophen, ipratropium, lidocaine-prilocaine, magic mouthwash, meclizine, morphine injection, ondansetron (ZOFRAN) IV, ondansetron, prochlorperazine, senna-docusate, sodium chloride, zolpidem  Assessment:  61 yo F with multiple myeloma restarting vanc and zosyn  for HCAP.    Pt. Received 8 days of vancomycin and zosyn ( 1/18-1/25) and now has new fever, increased SOB, increased O2 requirements, and worse CXR   Agree with continuing Levaquin 750 q48h for atypical coverage (started 1/26)  Wt 78 kg; CrCl 45 ml/min  Cultures: 1/18 Blood x 2 - NG - Final  1/19 Urine - NG-final 1/22 Sputum - Normal Flora  1/22 Urine- NG - Final 1/22 Blood x 2 - NGTD 1/23 Urine - NG - Final  1/23 blood x 2 - NGTD    Goal of Therapy:  Vancomycin trough level 15-20 mcg/ml  Plan:  1.) Vancomycin 750 mg IV q12h, first dose now 2.) Zosyn 3.375 gm IV q8h, first dose now 3.) Monitor renal function, check vancomycin troughs when appropriate 4.) Monitor temp, CXR 5.)  Continue Levaquin 750 iv q48h   Nadia Torr, Loma Messing PharmD 10:40 AM 07/27/2011

## 2011-07-27 NOTE — Progress Notes (Signed)
Patient ID: [name], female   DOB: 07/09/1950, 61 y.o.   MRN: [id number]  Subjective: No events overnight. Patient denies chest pain but reports intermittent shortness of breath worse over the night time with no specific aggravating factors. Pt also had one episode of T max 101 F.  Objective:  Vital signs in last 24 hours:  Filed Vitals:   07/26/11 1425 07/26/11 2135 07/27/11 0530 07/27/11 0835  BP: 123/73 129/64 118/70   Pulse: 84 102 81   Temp: 99 F (37.2 C) 100.2 F (37.9 C) 98.4 F (36.9 C)   TempSrc: Oral Axillary Oral   Resp: 18 19 18    Height:      Weight:      SpO2: 92% 94% 95% 87%    Intake/Output from previous day:   Intake/Output Summary (Last 24 hours) at 07/27/11 1478 Last data filed at 07/26/11 1300  Gross per 24 hour  Intake    240 ml  Output      0 ml  Net    240 ml    Physical Exam: General: Alert, awake, oriented x3, in no acute distress. HEENT: No bruits, no goiter. Moist mucous membranes, no scleral icterus, no conjunctival pallor. Heart: Regular rate and rhythm, S1/S2 +, no murmurs, rubs, gallops. Lungs: Decreased air movement at bases with crackles at bases as well. No wheezing, no rhonchi, no rales.  Abdomen: Soft, nontender, nondistended, positive bowel sounds. Extremities: No clubbing or cyanosis, no pitting edema,  positive pedal pulses. Neuro: Grossly nonfocal.  Lab Results:  Basic Metabolic Panel:    Component Value Date/Time   NA 140 07/27/2011 0457   K 3.3* 07/27/2011 0457   CL 109 07/27/2011 0457   CO2 22 07/27/2011 0457   BUN 7 07/27/2011 0457   CREATININE 1.35* 07/27/2011 0457   GLUCOSE 115* 07/27/2011 0457   CALCIUM 9.0 07/27/2011 0457   CBC:    Component Value Date/Time   WBC 3.6* 07/27/2011 0457   WBC 3.1* 07/08/2011 1404   HGB 8.6* 07/27/2011 0457   HGB 10.0* 07/08/2011 1404   HCT 24.8* 07/27/2011 0457   HCT 29.1* 07/08/2011 1404   PLT 130* 07/27/2011 0457   PLT 102* 07/08/2011 1404   MCV 89.5 07/27/2011 0457   MCV 92.4  07/08/2011 1404   NEUTROABS 2.4 07/24/2011 0540   NEUTROABS 2.4 07/08/2011 1404   LYMPHSABS 0.1* 07/24/2011 0540   LYMPHSABS 0.2* 07/08/2011 1404   MONOABS 0.3 07/24/2011 0540   MONOABS 0.4 07/08/2011 1404   EOSABS 0.1 07/24/2011 0540   EOSABS 0.0 07/08/2011 1404   BASOSABS 0.0 07/24/2011 0540   BASOSABS 0.0 07/08/2011 1404      Lab 07/27/11 0457 07/26/11 0335 07/25/11 0535 07/24/11 0540 07/23/11 0520  WBC 3.6* 3.5* 3.4* 2.9* 3.3*  HGB 8.6* 9.2* 8.7* 9.0* 9.7*  HCT 24.8* 26.6* 25.2* 26.2* 27.9*  PLT 130* 147* 133* 134* 136*  MCV 89.5 89.6 89.7 89.4 89.4  MCH 31.0 31.0 31.0 30.7 31.1  MCHC 34.7 34.6 34.5 34.4 34.8  RDW 14.9 15.0 15.1 15.1 15.3  LYMPHSABS -- -- -- 0.1* --  MONOABS -- -- -- 0.3 --  EOSABS -- -- -- 0.1 --  BASOSABS -- -- -- 0.0 --  BANDABS -- -- -- -- --    Lab 07/27/11 0457 07/26/11 0335 07/25/11 0535 07/24/11 0540 07/23/11 0520 07/22/11 0400 07/21/11 0500  NA 140 136 136 136 132* -- --  K 3.3* 3.6 3.7 3.3* 3.5 -- --  CL 109 106 106  104 100 -- --  CO2 22 21 23 24 23  -- --  GLUCOSE 115* 91 84 102* 87 -- --  BUN 7 7 8 9  5* -- --  CREATININE 1.35* 1.45* 1.37* 1.27* 1.05 -- --  CALCIUM 9.0 9.2 9.2 9.4 9.0 -- --  MG -- -- -- 2.5 -- 1.6 1.5    Recent Results (from the past 240 hour(s))  CULTURE, BLOOD (ROUTINE X 2)     Status: Normal   Collection Time   07/19/11  7:36 PM      Component Value Range Status Comment   Specimen Description BLOOD LEFT ARM   Final    Special Requests BOTTLES DRAWN AEROBIC AND ANAEROBIC [facility]   Final    Setup Time [id number]   Final    Culture NO GROWTH 5 DAYS   Final    Report Status 07/26/2011 FINAL   Final   CULTURE, BLOOD (ROUTINE X 2)     Status: Normal   Collection Time   07/19/11  7:42 PM      Component Value Range Status Comment   Specimen Description BLOOD LEFT HAND   Final    Special Requests BOTTLES DRAWN AEROBIC AND ANAEROBIC 4CC   Final    Setup Time [id number]   Final    Culture NO GROWTH 5 DAYS   Final    Report Status  07/26/2011 FINAL   Final   URINE CULTURE     Status: Normal   Collection Time   07/20/11  9:35 AM      Component Value Range Status Comment   Specimen Description URINE, CLEAN CATCH   Final    Special Requests NONE   Final    Setup Time [id number]   Final    Colony Count NO GROWTH   Final    Culture NO GROWTH   Final    Report Status 07/21/2011 FINAL   Final   URINE CULTURE     Status: Normal   Collection Time   07/23/11  9:12 AM      Component Value Range Status Comment   Specimen Description URINE, RANDOM   Final    Special Requests NONE   Final    Setup Time [id number]   Final    Colony Count NO GROWTH   Final    Culture NO GROWTH   Final    Report Status 07/24/2011 FINAL   Final   CULTURE, SPUTUM-ASSESSMENT     Status: Normal   Collection Time   07/23/11 11:24 AM      Component Value Range Status Comment   Specimen Description SPUTUM   Final    Special Requests NONE   Final    Sputum evaluation     Final    Value: THIS SPECIMEN IS ACCEPTABLE. RESPIRATORY CULTURE REPORT TO FOLLOW.   Report Status 07/23/2011 FINAL   Final   CULTURE, RESPIRATORY     Status: Normal   Collection Time   07/23/11 11:24 AM      Component Value Range Status Comment   Specimen Description SPUTUM   Final    Special Requests NONE   Final    Gram Stain     Final    Value: RARE WBC PRESENT,BOTH PMN AND MONONUCLEAR     FEW SQUAMOUS EPITHELIAL CELLS PRESENT     NO ORGANISMS SEEN   Culture NORMAL OROPHARYNGEAL FLORA   Final    Report Status 07/25/2011 FINAL   Final   CULTURE, BLOOD (ROUTINE X  2)     Status: Normal (Preliminary result)   Collection Time   07/23/11  4:20 PM      Component Value Range Status Comment   Specimen Description BLOOD LEFT ARM   Final    Special Requests BOTTLES DRAWN AEROBIC AND ANAEROBIC 10CC   Final    Setup Time [id number]   Final    Culture     Final    Value:        BLOOD CULTURE RECEIVED NO GROWTH TO DATE CULTURE WILL BE HELD FOR 5 DAYS BEFORE ISSUING A FINAL  NEGATIVE REPORT   Report Status PENDING   Incomplete   CULTURE, BLOOD (ROUTINE X 2)     Status: Normal (Preliminary result)   Collection Time   07/23/11  4:42 PM      Component Value Range Status Comment   Specimen Description BLOOD RIGHT ARM   Final    Special Requests BOTTLES DRAWN AEROBIC AND ANAEROBIC 10CC   Final    Setup Time [id number]   Final    Culture     Final    Value:        BLOOD CULTURE RECEIVED NO GROWTH TO DATE CULTURE WILL BE HELD FOR 5 DAYS BEFORE ISSUING A FINAL NEGATIVE REPORT   Report Status PENDING   Incomplete   CULTURE, BLOOD (ROUTINE X 2)     Status: Normal (Preliminary result)   Collection Time   07/24/11  5:10 PM      Component Value Range Status Comment   Specimen Description BLOOD LEFT HAND   Final    Special Requests BOTTLES DRAWN AEROBIC AND ANAEROBIC 10 CC EACH   Final    Setup Time [id number]   Final    Culture     Final    Value:        BLOOD CULTURE RECEIVED NO GROWTH TO DATE CULTURE WILL BE HELD FOR 5 DAYS BEFORE ISSUING A FINAL NEGATIVE REPORT   Report Status PENDING   Incomplete   CULTURE, BLOOD (ROUTINE X 2)     Status: Normal (Preliminary result)   Collection Time   07/24/11  5:20 PM      Component Value Range Status Comment   Specimen Description BLOOD LEFT ARM   Final    Special Requests BOTTLES DRAWN AEROBIC AND ANAEROBIC 10 CC EACH   Final    Setup Time [id number]   Final    Culture     Final    Value:        BLOOD CULTURE RECEIVED NO GROWTH TO DATE CULTURE WILL BE HELD FOR 5 DAYS BEFORE ISSUING A FINAL NEGATIVE REPORT   Report Status PENDING   Incomplete   URINE CULTURE     Status: Normal   Collection Time   07/24/11  7:23 PM      Component Value Range Status Comment   Specimen Description URINE, CLEAN CATCH   Final    Special Requests NONE   Final    Setup Time [id number]   Final    Colony Count NO GROWTH   Final    Culture NO GROWTH   Final    Report Status 07/25/2011 FINAL   Final     Studies/Results:  Dg Chest 2  View 07/26/2011  IMPRESSION:  1.  Worsening aeration to both upper lobes. 2.  Bilateral effusions and mild edema.  New from previous exam.    Medications: Scheduled Meds:   . albuterol  2.5 mg Nebulization BID  .  benzonatate  100 mg Oral TID  . docusate sodium  100 mg Oral BID  . gabapentin  100 mg Oral TID  . ipratropium  0.5 mg Nebulization BID  . levofloxacin (LEVAQUIN) IV  750 mg Intravenous Q48H  . levothyroxine  100 mcg Oral Q M,W,F  . levothyroxine  50 mcg Oral Q T,Th,S,Su  . lidocaine  4-5 mL Mouth/Throat Daily  . magnesium chloride  1 tablet Oral Daily  . oxyCODONE  20 mg Oral Q12H  . pantoprazole  40 mg Oral Daily  . DISCONTD: piperacillin-tazobactam (ZOSYN)  IV  3.375 g Intravenous Q8H  . DISCONTD: vancomycin  750 mg Intravenous Q12H   Continuous Infusions:   . sodium chloride 75 mL/hr at 07/25/11 0600   PRN Meds:.acetaminophen, acetaminophen, albuterol, ALPRAZolam, alum & mag hydroxide-simeth, bisacodyl, emollient, guaiFENesin, HYDROcodone-acetaminophen, ipratropium, lidocaine-prilocaine, magic mouthwash, meclizine, morphine injection, ondansetron (ZOFRAN) IV, ondansetron, prochlorperazine, senna-docusate, sodium chloride, zolpidem  Assessment/Plan:  Principal Problem:  *PNA (pneumonia) - pt showing slight clinical improvement but still experiences intermittent O2 level drop, with occasional fever spikes as well - discussed CXR findings with PCCM and the recommendation was to continue broad spectrum abx along with levaquin - will monitor vitals per floor protocol - will call PCCM on Monday if pt does not show significant clinical improvement on the above regimen and will let them decide on need for BAL - continue nebulizers prn and incentive spirometry  Active Problems:  GERD - continue PPI   Weakness generalized - PT evaluation   Pancytopenia - secondary to MM and recent chemo/radiation therapy - this has actually remained stable during the hospitalization -  will obtain CBC in AM   SOB (shortness of breath) on exertion - please see principal problem   Hypokalemia - continue to supplement if indicated - check Mg level   Fever - unclear what the exact etiology is and most likely secondary to HCAP - will reinitiate broad spectrum abx for now - if pt does not improve clinically will call PCCM for consideration of BAL   EDUCATION - test results and diagnostic studies were discussed with patient  - patient verbalized the understanding - questions were answered at the bedside and contact information was provided for additional questions or concerns   LOS: 8 days   MAGICK-Camy Leder 07/27/2011, 9:28 AM  [facility] Pager: [phone]

## 2011-07-28 DIAGNOSIS — J8409 Other alveolar and parieto-alveolar conditions: Secondary | ICD-10-CM

## 2011-07-28 DIAGNOSIS — R0602 Shortness of breath: Secondary | ICD-10-CM

## 2011-07-28 LAB — BASIC METABOLIC PANEL
BUN: 5 mg/dL — ABNORMAL LOW (ref 6–23)
Calcium: 9 mg/dL (ref 8.4–10.5)
Chloride: 110 mEq/L (ref 96–112)
Creatinine, Ser: 1.33 mg/dL — ABNORMAL HIGH (ref 0.50–1.10)
GFR calc Af Amer: 49 mL/min — ABNORMAL LOW (ref >90)
GFR calc non Af Amer: 42 mL/min — ABNORMAL LOW (ref >90)

## 2011-07-28 LAB — CBC
MCHC: 34.7 g/dL (ref 30.0–36.0)
MCV: 90.1 fL (ref 78.0–100.0)
Platelets: 134 10*3/uL — ABNORMAL LOW (ref 150–400)
RDW: 15.1 % (ref 11.5–15.5)
WBC: 3.6 10*3/uL — ABNORMAL LOW (ref 4.0–10.5)

## 2011-07-28 LAB — PRO B NATRIURETIC PEPTIDE: Pro B Natriuretic peptide (BNP): 978.3 pg/mL — ABNORMAL HIGH (ref 0–125)

## 2011-07-28 LAB — VANCOMYCIN, TROUGH: Vancomycin Tr: 19.8 ug/mL (ref 10.0–20.0)

## 2011-07-28 MED ORDER — POTASSIUM CHLORIDE CRYS ER 20 MEQ PO TBCR
40.0000 meq | EXTENDED_RELEASE_TABLET | Freq: Once | ORAL | Status: AC
  Administered 2011-07-28: 40 meq via ORAL
  Filled 2011-07-28: qty 2

## 2011-07-28 MED ORDER — MEGESTROL ACETATE 40 MG/ML PO SUSP
200.0000 mg | Freq: Every day | ORAL | Status: DC
  Administered 2011-07-28 – 2011-08-01 (×5): 200 mg via ORAL
  Filled 2011-07-28 (×5): qty 5

## 2011-07-28 NOTE — Consult Note (Signed)
Dictation # [id number]

## 2011-07-28 NOTE — Progress Notes (Signed)
Patient ID: [name], female   DOB: May 15, 1951, 61 y.o.   MRN: [id number]  Subjective: No events overnight. Patient denies chest pain, but still has intermittent episodes of shortness of breath.  Objective:  Vital signs in last 24 hours:  Filed Vitals:   07/27/11 2250 07/27/11 2330 07/28/11 0600 07/28/11 0954  BP:   137/74   Pulse:   79   Temp: 102 F (38.9 C) 99.5 F (37.5 C) 98.8 F (37.1 C)   TempSrc: Oral Oral Oral   Resp:   18   Height:      Weight:   78.6 kg (173 lb 4.5 oz)   SpO2:   96% 98%    Intake/Output from previous day:   Intake/Output Summary (Last 24 hours) at 07/28/11 0956 Last data filed at 07/27/11 1804  Gross per 24 hour  Intake   1340 ml  Output    800 ml  Net    540 ml    Physical Exam: General: Alert, awake, oriented x3, in no acute distress. HEENT: No bruits, no goiter. Moist mucous membranes, no scleral icterus, no conjunctival pallor. Heart: Regular rate and rhythm, S1/S2 +, no murmurs, rubs, gallops. Lungs: Decreased sounds at bases. No wheezing, no rhonchi, no rales.  Abdomen: Soft, nontender, nondistended, positive bowel sounds. Extremities: No clubbing or cyanosis, no pitting edema,  positive pedal pulses. Neuro: Grossly nonfocal.  Lab Results:  Basic Metabolic Panel:    Component Value Date/Time   NA 140 07/28/2011 0545   K 3.3* 07/28/2011 0545   CL 110 07/28/2011 0545   CO2 23 07/28/2011 0545   BUN 5* 07/28/2011 0545   CREATININE 1.33* 07/28/2011 0545   GLUCOSE 89 07/28/2011 0545   CALCIUM 9.0 07/28/2011 0545   CBC:    Component Value Date/Time   WBC 3.6* 07/28/2011 0545   WBC 3.1* 07/08/2011 1404   HGB 8.5* 07/28/2011 0545   HGB 10.0* 07/08/2011 1404   HCT 24.5* 07/28/2011 0545   HCT 29.1* 07/08/2011 1404   PLT 134* 07/28/2011 0545   PLT 102* 07/08/2011 1404   MCV 90.1 07/28/2011 0545   MCV 92.4 07/08/2011 1404   NEUTROABS 2.4 07/24/2011 0540   NEUTROABS 2.4 07/08/2011 1404   LYMPHSABS 0.1* 07/24/2011 0540   LYMPHSABS 0.2* 07/08/2011  1404   MONOABS 0.3 07/24/2011 0540   MONOABS 0.4 07/08/2011 1404   EOSABS 0.1 07/24/2011 0540   EOSABS 0.0 07/08/2011 1404   BASOSABS 0.0 07/24/2011 0540   BASOSABS 0.0 07/08/2011 1404      Lab 07/28/11 0545 07/27/11 0457 07/26/11 0335 07/25/11 0535 07/24/11 0540  WBC 3.6* 3.6* 3.5* 3.4* 2.9*  HGB 8.5* 8.6* 9.2* 8.7* 9.0*  HCT 24.5* 24.8* 26.6* 25.2* 26.2*  PLT 134* 130* 147* 133* 134*  MCV 90.1 89.5 89.6 89.7 89.4  MCH 31.3 31.0 31.0 31.0 30.7  MCHC 34.7 34.7 34.6 34.5 34.4  RDW 15.1 14.9 15.0 15.1 15.1  LYMPHSABS -- -- -- -- 0.1*  MONOABS -- -- -- -- 0.3  EOSABS -- -- -- -- 0.1  BASOSABS -- -- -- -- 0.0  BANDABS -- -- -- -- --    Lab 07/28/11 0545 07/27/11 0457 07/26/11 0335 07/25/11 0535 07/24/11 0540 07/22/11 0400  NA 140 140 136 136 136 --  K 3.3* 3.3* 3.6 3.7 3.3* --  CL 110 109 106 106 104 --  CO2 23 22 21 23 24  --  GLUCOSE 89 115* 91 84 102* --  BUN 5* 7 7 8 9  --  CREATININE 1.33* 1.35* 1.45* 1.37* 1.27* --  CALCIUM 9.0 9.0 9.2 9.2 9.4 --  MG -- -- -- -- 2.5 1.6    Recent Results (from the past 240 hour(s))  CULTURE, BLOOD (ROUTINE X 2)     Status: Normal   Collection Time   07/19/11  7:36 PM      Component Value Range Status Comment   Specimen Description BLOOD LEFT ARM   Final    Special Requests BOTTLES DRAWN AEROBIC AND ANAEROBIC [facility]   Final    Setup Time [id number]   Final    Culture NO GROWTH 5 DAYS   Final    Report Status 07/26/2011 FINAL   Final   CULTURE, BLOOD (ROUTINE X 2)     Status: Normal   Collection Time   07/19/11  7:42 PM      Component Value Range Status Comment   Specimen Description BLOOD LEFT HAND   Final    Special Requests BOTTLES DRAWN AEROBIC AND ANAEROBIC 4CC   Final    Setup Time [id number]   Final    Culture NO GROWTH 5 DAYS   Final    Report Status 07/26/2011 FINAL   Final   URINE CULTURE     Status: Normal   Collection Time   07/20/11  9:35 AM      Component Value Range Status Comment   Specimen Description URINE, CLEAN  CATCH   Final    Special Requests NONE   Final    Setup Time [id number]   Final    Colony Count NO GROWTH   Final    Culture NO GROWTH   Final    Report Status 07/21/2011 FINAL   Final   URINE CULTURE     Status: Normal   Collection Time   07/23/11  9:12 AM      Component Value Range Status Comment   Specimen Description URINE, RANDOM   Final    Special Requests NONE   Final    Setup Time [id number]   Final    Colony Count NO GROWTH   Final    Culture NO GROWTH   Final    Report Status 07/24/2011 FINAL   Final   CULTURE, SPUTUM-ASSESSMENT     Status: Normal   Collection Time   07/23/11 11:24 AM      Component Value Range Status Comment   Specimen Description SPUTUM   Final    Special Requests NONE   Final    Sputum evaluation     Final    Value: THIS SPECIMEN IS ACCEPTABLE. RESPIRATORY CULTURE REPORT TO FOLLOW.   Report Status 07/23/2011 FINAL   Final   CULTURE, RESPIRATORY     Status: Normal   Collection Time   07/23/11 11:24 AM      Component Value Range Status Comment   Specimen Description SPUTUM   Final    Special Requests NONE   Final    Gram Stain     Final    Value: RARE WBC PRESENT,BOTH PMN AND MONONUCLEAR     FEW SQUAMOUS EPITHELIAL CELLS PRESENT     NO ORGANISMS SEEN   Culture NORMAL OROPHARYNGEAL FLORA   Final    Report Status 07/25/2011 FINAL   Final   CULTURE, BLOOD (ROUTINE X 2)     Status: Normal (Preliminary result)   Collection Time   07/23/11  4:20 PM      Component Value Range Status Comment   Specimen Description BLOOD LEFT  ARM   Final    Special Requests BOTTLES DRAWN AEROBIC AND ANAEROBIC 10CC   Final    Setup Time [id number]   Final    Culture     Final    Value:        BLOOD CULTURE RECEIVED NO GROWTH TO DATE CULTURE WILL BE HELD FOR 5 DAYS BEFORE ISSUING A FINAL NEGATIVE REPORT   Report Status PENDING   Incomplete   CULTURE, BLOOD (ROUTINE X 2)     Status: Normal (Preliminary result)   Collection Time   07/23/11  4:42 PM      Component  Value Range Status Comment   Specimen Description BLOOD RIGHT ARM   Final    Special Requests BOTTLES DRAWN AEROBIC AND ANAEROBIC 10CC   Final    Setup Time [id number]   Final    Culture     Final    Value:        BLOOD CULTURE RECEIVED NO GROWTH TO DATE CULTURE WILL BE HELD FOR 5 DAYS BEFORE ISSUING A FINAL NEGATIVE REPORT   Report Status PENDING   Incomplete   CULTURE, BLOOD (ROUTINE X 2)     Status: Normal (Preliminary result)   Collection Time   07/24/11  5:10 PM      Component Value Range Status Comment   Specimen Description BLOOD LEFT HAND   Final    Special Requests BOTTLES DRAWN AEROBIC AND ANAEROBIC 10 CC EACH   Final    Setup Time [id number]   Final    Culture     Final    Value:        BLOOD CULTURE RECEIVED NO GROWTH TO DATE CULTURE WILL BE HELD FOR 5 DAYS BEFORE ISSUING A FINAL NEGATIVE REPORT   Report Status PENDING   Incomplete   CULTURE, BLOOD (ROUTINE X 2)     Status: Normal (Preliminary result)   Collection Time   07/24/11  5:20 PM      Component Value Range Status Comment   Specimen Description BLOOD LEFT ARM   Final    Special Requests BOTTLES DRAWN AEROBIC AND ANAEROBIC 10 CC EACH   Final    Setup Time [id number]   Final    Culture     Final    Value:        BLOOD CULTURE RECEIVED NO GROWTH TO DATE CULTURE WILL BE HELD FOR 5 DAYS BEFORE ISSUING A FINAL NEGATIVE REPORT   Report Status PENDING   Incomplete   URINE CULTURE     Status: Normal   Collection Time   07/24/11  7:23 PM      Component Value Range Status Comment   Specimen Description URINE, CLEAN CATCH   Final    Special Requests NONE   Final    Setup Time [id number]   Final    Colony Count NO GROWTH   Final    Culture NO GROWTH   Final    Report Status 07/25/2011 FINAL   Final     Studies/Results:  Dg Chest 2 View 07/26/2011    IMPRESSION:  1.  Worsening aeration to both upper lobes. 2.  Bilateral effusions and mild edema.  New from previous exam.   Medications: Scheduled Meds:   .  albuterol  2.5 mg Nebulization BID  . benzonatate  100 mg Oral TID  . docusate sodium  100 mg Oral BID  . gabapentin  100 mg Oral TID  . ipratropium  0.5 mg Nebulization BID  .  levofloxacin (LEVAQUIN) IV  750 mg Intravenous Q48H  . levothyroxine  100 mcg Oral Q M,W,F  . levothyroxine  50 mcg Oral Q T,Th,S,Su  . lidocaine  4-5 mL Mouth/Throat Daily  . magnesium chloride  1 tablet Oral Daily  . oxyCODONE  20 mg Oral Q12H  . pantoprazole  40 mg Oral Daily  . piperacillin-tazobactam (ZOSYN)  IV  3.375 g Intravenous Q8H  . vancomycin  750 mg Intravenous Q12H  . DISCONTD: piperacillin-tazobactam (ZOSYN)  IV  2.25 g Intravenous Q12H  . DISCONTD: vancomycin  1,000 mg Intravenous Q12H   Continuous Infusions:   . sodium chloride 75 mL/hr at 07/27/11 0932   PRN Meds:.acetaminophen, acetaminophen, albuterol, ALPRAZolam, alum & mag hydroxide-simeth, bisacodyl, emollient, guaiFENesin, HYDROcodone-acetaminophen, ipratropium, lidocaine-prilocaine, magic mouthwash, meclizine, morphine injection, ondansetron (ZOFRAN) IV, ondansetron, prochlorperazine, senna-docusate, sodium chloride, zolpidem  Assessment/Plan:  Principal Problem:  *PNA (pneumonia)  - pt showing slight clinical improvement but still experiences intermittent O2 level drop, with occasional fever spikes as well. Last T max 102 F - discussed CXR findings with PCCM and the recommendation was to continue broad spectrum abx along with levaquin  - will monitor vitals per floor protocol  - will call PCCM tomorrow AM for further evaluation, this was discussed with Dr. Shelle Iron who is on call over the weekend - continue nebulizers prn and incentive spirometry   Active Problems:  GERD  - continue PPI   Weakness generalized  - PT evaluation   Pancytopenia  - secondary to MM and recent chemo/radiation therapy  - this has actually remained stable during the hospitalization  - will obtain CBC in AM   SOB (shortness of breath) on exertion  -  please see principal problem   Hypokalemia  - continue to supplement if indicated  - check Mg level   Fever  - unclear what the exact etiology is and most likely secondary to HCAP  - will reinitiate broad spectrum abx for now  - pt will most likely need BAL for further evaluation  EDUCATION  - test results and diagnostic studies were discussed with patient  - patient verbalized the understanding  - questions were answered at the bedside and contact information was provided for additional questions or concerns    LOS: 9 days   MAGICK-Petronella Shuford 07/28/2011, 9:56 AM  [facility] Pager: [phone]

## 2011-07-28 NOTE — Consult Note (Signed)
NAMEKAFI, DOTTER            ACCOUNT NO.:  [id number]  MEDICAL RECORD NO.:  [id number]  LOCATION:  1509                         FACILITY[facility]  [facility]  PHYSICIAN:  Barbaraann Share, MD,FCCPDATE OF BIRTH:  02/20/1952  DATE OF CONSULTATION:  07/28/2011 DATE OF DISCHARGE:                                CONSULTATION   REFERRING PHYSICIAN:  [facility].  HISTORY OF PRESENT ILLNESS:  The patient is a very pleasant 61 year old female who I have been asked to see for persistent pulmonary infiltrates.  She has a long history of recurrent multiple myeloma, and is receiving aggressive chemotherapy.  She was last treated 1 week prior to admission and did have issues with pancytopenia.  She presented on the 18th of this month with worsening fatigue, severe weakness, increasing shortness of breath, and a minimal cough, but no congestion. She had a chest x-ray that did show a very wispy abnormality medially in the right upper lobe, but no obvious infiltrate otherwise.  She also had been having fevers.  The patient was admitted and placed on broad- spectrum antibiotics for a possible Gram positive or a more resistant Gram negative infection, as well as IV hydration.  She had significant improvement in her weakness, and continued, however, to have low grade fevers.  The patient states that her cough actually increased while in the hospital, but it has been dry in nature until most recently she has been bringing up scant discolored mucus with a blood tinge.  The patient ultimately had improvement in her fever curve, and it was recommended by Infectious Disease that her antibiotics be narrowed to Levaquin alone. The patient also had a CT scan of her chest during this time, which showed worsening infiltrates in both upper lobes right much greater than left, as well as a hazy ground glass opacity in a few of the other lung fields.  There was also a minimal right pleural effusion.  Again,  the patient states that her weakness, malaise, and her shortness of breath have all improved.  However, approximately 48 hours ago she began to have an increase in low grade fever, and last night spiked as high as 102 degrees.  I have been asked to see the patient regarding further management, and possibly bronchoscopy to rule out an opportunistic infection.  PAST MEDICAL HISTORY:  Significant for: 1. Multiple myeloma. 2. History of hypothyroidism. 3. History of GERD. 4. Status post cholecystectomy.  SOCIAL HISTORY:  The patient has a history of smoking, but has not done so for quite some time.  She denies any history of alcohol or drug abuse.  FAMILY HISTORY:  Remarkable for lung cancer, colon cancer, as well as breast cancer.  Otherwise it is unremarkable.  REVIEW OF SYSTEMS:  A 10 point review of systems was done and were negative other than that listed in the history of present illness.  PHYSICAL EXAMINATION:  GENERAL:  She is a well-developed female in no acute distress. VITAL SIGNS:  Blood pressure is 113/66, pulse 90, temperature 98.6, respiratory rate 18. HEENT:  Pupils equal, round, reactive to light and accommodation. Extraocular muscles intact.  Nares patent without discharge.  Oropharynx is clear. NECK:  Supple without JVD or  lymphadenopathies.  No palpable thyromegaly. CHEST:  Reveals mild crackles in the right upper lung zones anteriorly, however, there are no significant crackles posteriorly.  There is excellent airflow with no wheezes or rhonchi. CARDIAC:  Exam reveals a regular rate and rhythm with no murmurs or gallops. ABDOMEN:  Soft, nontender, nondistended with good bowel sounds. Genital exam, rectal exam, breasts exam were not done and not indicated. EXTREMITIES:  Lower extremities show mild ankle edema, no cyanosis present.  Distal pulses were intact. NEUROLOGIC:  The patient was alert and oriented, and could move all 4 extremities without any  significant deficit.  IMPRESSION: 1. Pulmonary infiltrates of unknown origin in a patient with a history     of multiple myeloma, recent chemotherapy with a period of     pancytopenia, and recurrent fevers despite antibiotics.     Differential diagnosis includes unknown bacterial infection,     opportunistic pathogens, chemo induced lung injury, possible     infiltrates related to the patient's myeloma such as amyloidosis,     and finally whether there may be a superimposed component of     pulmonary edema.  The patient seemed to improve on broad-spectrum     antibiotics, but had an escalation of her temp curve when changed     over to Levaquin alone.  She really has not had a radiographic     improvement.  At this point, I would like to get her back on broad-     spectrum antibiotics, and also repeat a chest x-ray to see if there     has been a change.  I will need to discuss her chemotherapy with     her oncologist to see if this is known to have pulmonary toxicity.     Since the patient has continued to have fever despite antibiotics,     more than likely she is going to need fiberoptic bronchoscopy with     bronchoalveolar lavage.  The question will be whether we need to do     biopsy.  PLAN: 1. Recheck chest x-ray in a.m. 2. Continue the patient on broad-spectrum antibiotics for now. 3. We will check BNP, as well as a sed rate. 4. We will discuss with Oncology in the morning whether we need to     proceed with bronchoscopy, with bronchoalveolar lavage and possible     biopsy.     Barbaraann Share, MD,FCCP     [facility]  D:  07/28/2011  T:  07/28/2011  Job:  [id number]

## 2011-07-29 ENCOUNTER — Inpatient Hospital Stay (HOSPITAL_COMMUNITY): Payer: Medicare HMO

## 2011-07-29 LAB — CBC
HCT: 23.9 % — ABNORMAL LOW (ref 36.0–46.0)
MCHC: 34.3 g/dL (ref 30.0–36.0)
MCV: 90.5 fL (ref 78.0–100.0)
Platelets: 137 10*3/uL — ABNORMAL LOW (ref 150–400)
RDW: 15.3 % (ref 11.5–15.5)

## 2011-07-29 LAB — BASIC METABOLIC PANEL
BUN: 6 mg/dL (ref 6–23)
Calcium: 9 mg/dL (ref 8.4–10.5)
Creatinine, Ser: 1.35 mg/dL — ABNORMAL HIGH (ref 0.50–1.10)
GFR calc Af Amer: 48 mL/min — ABNORMAL LOW (ref >90)
GFR calc non Af Amer: 42 mL/min — ABNORMAL LOW (ref >90)

## 2011-07-29 MED ORDER — POTASSIUM CHLORIDE CRYS ER 20 MEQ PO TBCR
40.0000 meq | EXTENDED_RELEASE_TABLET | Freq: Once | ORAL | Status: AC
  Administered 2011-07-29: 40 meq via ORAL
  Filled 2011-07-29: qty 2

## 2011-07-29 NOTE — Progress Notes (Signed)
INFECTIOUS DISEASE PROGRESS NOTE  ID: [name] is a 61 y.o. female with multiple myeloma and pneumonia.    Subjective: She continues to feel better but did spike another high fever yesterday.  She was put back on broad spectrum antibiotics.  She though remains off O2 and does not feel particularly SOB.    Abtx:  Anti-infectives     Start     Dose/Rate Route Frequency Ordered Stop   07/27/11 1100   vancomycin (VANCOCIN) IVPB 1000 mg/200 mL premix  Status:  Discontinued        1,000 mg 200 mL/hr over 60 Minutes Intravenous Every 12 hours 07/27/11 1003 07/27/11 1017   07/27/11 1100   piperacillin-tazobactam (ZOSYN) IVPB 2.25 g  Status:  Discontinued        2.25 g 100 mL/hr over 30 Minutes Intravenous Every 12 hours 07/27/11 1003 07/27/11 1018   07/27/11 1100   vancomycin (VANCOCIN) 750 mg in sodium chloride 0.9 % 150 mL IVPB        750 mg 150 mL/hr over 60 Minutes Intravenous Every 12 hours 07/27/11 1018     07/27/11 1030  piperacillin-tazobactam (ZOSYN) IVPB 3.375 g       3.375 g 12.5 mL/hr over 240 Minutes Intravenous Every 8 hours 07/27/11 1018     07/27/11 1000   Levofloxacin (LEVAQUIN) IVPB 750 mg        750 mg 100 mL/hr over 90 Minutes Intravenous Every 48 hours 07/25/11 1006     07/24/11 2200   vancomycin (VANCOCIN) 750 mg in sodium chloride 0.9 % 150 mL IVPB  Status:  Discontinued        750 mg 150 mL/hr over 60 Minutes Intravenous Every 12 hours 07/24/11 2015 07/26/11 1104   07/22/11 1030   Levofloxacin (LEVAQUIN) IVPB 750 mg  Status:  Discontinued        750 mg 100 mL/hr over 90 Minutes Intravenous Every 24 hours 07/22/11 0945 07/25/11 1006   07/20/11 0000   piperacillin-tazobactam (ZOSYN) IVPB 3.375 g  Status:  Discontinued        3.375 g 12.5 mL/hr over 240 Minutes Intravenous 4 times per day 07/19/11 2047 07/19/11 2115   07/19/11 2300   oseltamivir (TAMIFLU) capsule 75 mg  Status:  Discontinued        75 mg Oral 2 times daily 07/19/11 2047 07/21/11 1533    07/19/11 2000   vancomycin (VANCOCIN) IVPB 1000 mg/200 mL premix  Status:  Discontinued        1,000 mg 200 mL/hr over 60 Minutes Intravenous Every 12 hours 07/19/11 1931 07/24/11 2015   07/19/11 1415   cefTRIAXone (ROCEPHIN) 1 g in dextrose 5 % 50 mL IVPB        1 g 100 mL/hr over 30 Minutes Intravenous  Once 07/19/11 1407 07/19/11 1519   07/19/11 1415   azithromycin (ZITHROMAX) 500 mg in dextrose 5 % 250 mL IVPB        500 mg 250 mL/hr over 60 Minutes Intravenous  Once 07/19/11 1407 07/19/11 1636   07/19/11 0000   piperacillin-tazobactam (ZOSYN) IVPB 3.375 g  Status:  Discontinued        3.375 g 12.5 mL/hr over 240 Minutes Intravenous Every 8 hours 07/19/11 2116 07/26/11 1104          Medications: I have reviewed the patient's current medications.  Objective: Vital signs in last 24 hours: Temp:  [98.6 F (37 C)-99.5 F (37.5 C)] 99.5 F (37.5 C) (01/28 4540)  Pulse Rate:  [88-95] 90  (01/28 0621) Resp:  [18] 18  (01/28 0619) BP: (113-138)/(66-76) 122/70 mmHg (01/28 0621) SpO2:  [95 %-98 %] 95 % (01/28 0619) Weight:  [171 lb 1.2 oz (77.6 kg)] 171 lb 1.2 oz (77.6 kg) (01/28 0619)   General appearance: alert, cooperative and no distress Resp: clear to auscultation bilaterally Cardio: regular rate and rhythm, S1, S2 normal, no murmur, click, rub or gallop GI: soft, non-tender; bowel sounds normal; no masses,  no organomegaly Extremities: extremities normal, atraumatic, no cyanosis or edema Skin: Skin color, texture, turgor normal. No rashes or lesions  Lab Results  Basename 07/29/11 0535 07/28/11 0545  WBC 3.6* 3.6*  HGB 8.2* 8.5*  HCT 23.9* 24.5*  NA 141 140  K 3.2* 3.3*  CL 110 110  CO2 22 23  BUN 6 5*  CREATININE 1.35* 1.33*  GLU -- --   Liver Panel No results found for this basename: PROT:2,ALBUMIN:2,AST:2,ALT:2,ALKPHOS:2,BILITOT:2,BILIDIR:2,IBILI:2 in the last 72 hours Sedimentation Rate  Basename 07/28/11 1715  ESRSEDRATE 131*   C-Reactive  Protein No results found for this basename: CRP:2 in the last 72 hours  Microbiology: Recent Results (from the past 240 hour(s))  CULTURE, BLOOD (ROUTINE X 2)     Status: Normal   Collection Time   07/19/11  7:36 PM      Component Value Range Status Comment   Specimen Description BLOOD LEFT ARM   Final    Special Requests BOTTLES DRAWN AEROBIC AND ANAEROBIC [facility]   Final    Culture  Setup Time [id number]   Final    Culture NO GROWTH 5 DAYS   Final    Report Status 07/26/2011 FINAL   Final   CULTURE, BLOOD (ROUTINE X 2)     Status: Normal   Collection Time   07/19/11  7:42 PM      Component Value Range Status Comment   Specimen Description BLOOD LEFT HAND   Final    Special Requests BOTTLES DRAWN AEROBIC AND ANAEROBIC 4CC   Final    Culture  Setup Time [id number]   Final    Culture NO GROWTH 5 DAYS   Final    Report Status 07/26/2011 FINAL   Final   URINE CULTURE     Status: Normal   Collection Time   07/20/11  9:35 AM      Component Value Range Status Comment   Specimen Description URINE, CLEAN CATCH   Final    Special Requests NONE   Final    Culture  Setup Time [id number]   Final    Colony Count NO GROWTH   Final    Culture NO GROWTH   Final    Report Status 07/21/2011 FINAL   Final   URINE CULTURE     Status: Normal   Collection Time   07/23/11  9:12 AM      Component Value Range Status Comment   Specimen Description URINE, RANDOM   Final    Special Requests NONE   Final    Culture  Setup Time [id number]   Final    Colony Count NO GROWTH   Final    Culture NO GROWTH   Final    Report Status 07/24/2011 FINAL   Final   CULTURE, SPUTUM-ASSESSMENT     Status: Normal   Collection Time   07/23/11 11:24 AM      Component Value Range Status Comment   Specimen Description SPUTUM   Final    Special Requests NONE  Final    Sputum evaluation     Final    Value: THIS SPECIMEN IS ACCEPTABLE. RESPIRATORY CULTURE REPORT TO FOLLOW.   Report Status 07/23/2011 FINAL   Final    CULTURE, RESPIRATORY     Status: Normal   Collection Time   07/23/11 11:24 AM      Component Value Range Status Comment   Specimen Description SPUTUM   Final    Special Requests NONE   Final    Gram Stain     Final    Value: RARE WBC PRESENT,BOTH PMN AND MONONUCLEAR     FEW SQUAMOUS EPITHELIAL CELLS PRESENT     NO ORGANISMS SEEN   Culture NORMAL OROPHARYNGEAL FLORA   Final    Report Status 07/25/2011 FINAL   Final   CULTURE, BLOOD (ROUTINE X 2)     Status: Normal (Preliminary result)   Collection Time   07/23/11  4:20 PM      Component Value Range Status Comment   Specimen Description BLOOD LEFT ARM   Final    Special Requests BOTTLES DRAWN AEROBIC AND ANAEROBIC 10CC   Final    Culture  Setup Time [id number]   Final    Culture     Final    Value:        BLOOD CULTURE RECEIVED NO GROWTH TO DATE CULTURE WILL BE HELD FOR 5 DAYS BEFORE ISSUING A FINAL NEGATIVE REPORT   Report Status PENDING   Incomplete   CULTURE, BLOOD (ROUTINE X 2)     Status: Normal (Preliminary result)   Collection Time   07/23/11  4:42 PM      Component Value Range Status Comment   Specimen Description BLOOD RIGHT ARM   Final    Special Requests BOTTLES DRAWN AEROBIC AND ANAEROBIC 10CC   Final    Culture  Setup Time [id number]   Final    Culture     Final    Value:        BLOOD CULTURE RECEIVED NO GROWTH TO DATE CULTURE WILL BE HELD FOR 5 DAYS BEFORE ISSUING A FINAL NEGATIVE REPORT   Report Status PENDING   Incomplete   CULTURE, BLOOD (ROUTINE X 2)     Status: Normal (Preliminary result)   Collection Time   07/24/11  5:10 PM      Component Value Range Status Comment   Specimen Description BLOOD LEFT HAND   Final    Special Requests BOTTLES DRAWN AEROBIC AND ANAEROBIC 10 CC EACH   Final    Culture  Setup Time [id number]   Final    Culture     Final    Value:        BLOOD CULTURE RECEIVED NO GROWTH TO DATE CULTURE WILL BE HELD FOR 5 DAYS BEFORE ISSUING A FINAL NEGATIVE REPORT   Report Status PENDING    Incomplete   CULTURE, BLOOD (ROUTINE X 2)     Status: Normal (Preliminary result)   Collection Time   07/24/11  5:20 PM      Component Value Range Status Comment   Specimen Description BLOOD LEFT ARM   Final    Special Requests BOTTLES DRAWN AEROBIC AND ANAEROBIC 10 CC EACH   Final    Culture  Setup Time [id number]   Final    Culture     Final    Value:        BLOOD CULTURE RECEIVED NO GROWTH TO DATE CULTURE WILL BE HELD FOR 5 DAYS BEFORE ISSUING  A FINAL NEGATIVE REPORT   Report Status PENDING   Incomplete   URINE CULTURE     Status: Normal   Collection Time   07/24/11  7:23 PM      Component Value Range Status Comment   Specimen Description URINE, CLEAN CATCH   Final    Special Requests NONE   Final    Culture  Setup Time [id number]   Final    Colony Count NO GROWTH   Final    Culture NO GROWTH   Final    Report Status 07/25/2011 FINAL   Final     Studies/Results: Dg Chest 2 View  07/29/2011  *RADIOLOGY REPORT*  Clinical Data: Short of breath  CHEST - 2 VIEW  Comparison: 07/26/2011  Findings: Stable right internal jugular vein Port-A-Cath.  Normal heart size.  Bilateral central and upper lobe heterogeneous opacities are improved.  No pneumothorax.  No pleural fluid. Bilateral effusions resolved.  IMPRESSION: Improved bilateral predominately upper lobe airspace disease. Resolved effusions.  Original Report Authenticated By: Donavan Burnet, M.D.     Assessment/Plan: 1) Pneumonia - patient has been seen by CCM and to get BAL in am.  This should be sent for bacterial, AFB smear and cultures, fungal culture.    COMER, ROBERT Infectious Diseases 07/29/2011, 2:13 PM

## 2011-07-29 NOTE — Progress Notes (Addendum)
Patient ID: [name], female   DOB: 12/24/1950, 61 y.o.   MRN: [id number]  Subjective: No events overnight. Patient reports feeling slightly better this AM.  Objective:  Vital signs in last 24 hours:  Filed Vitals:   07/28/11 2230 07/29/11 0619 07/29/11 0620 07/29/11 0621  BP: 124/67 138/73 125/76 122/70  Pulse: 92 88 95 90  Temp: 98.8 F (37.1 C) 99.5 F (37.5 C)    TempSrc: Oral Oral    Resp: 18 18    Height:      Weight:  77.6 kg (171 lb 1.2 oz)    SpO2: 95% 95%      Intake/Output from previous day:  No intake or output data in the 24 hours ending 07/29/11 0901  Physical Exam: General: Alert, awake, oriented x3, in no acute distress. HEENT: No bruits, no goiter. Moist mucous membranes, no scleral icterus, no conjunctival pallor. Heart: Regular rate and rhythm, S1/S2 +, no murmurs, rubs, gallops. Lungs: Scattered crackles. No wheezing, no rhonchi, no rales.  Abdomen: Soft, nontender, nondistended, positive bowel sounds. Extremities: No clubbing or cyanosis, no pitting edema,  positive pedal pulses. Neuro: Grossly nonfocal.  Lab Results:  Basic Metabolic Panel:    Component Value Date/Time   NA 141 07/29/2011 0535   K 3.2* 07/29/2011 0535   CL 110 07/29/2011 0535   CO2 22 07/29/2011 0535   BUN 6 07/29/2011 0535   CREATININE 1.35* 07/29/2011 0535   GLUCOSE 91 07/29/2011 0535   CALCIUM 9.0 07/29/2011 0535   CBC:    Component Value Date/Time   WBC 3.6* 07/29/2011 0535   WBC 3.1* 07/08/2011 1404   HGB 8.2* 07/29/2011 0535   HGB 10.0* 07/08/2011 1404   HCT 23.9* 07/29/2011 0535   HCT 29.1* 07/08/2011 1404   PLT 137* 07/29/2011 0535   PLT 102* 07/08/2011 1404   MCV 90.5 07/29/2011 0535   MCV 92.4 07/08/2011 1404   NEUTROABS 2.4 07/24/2011 0540   NEUTROABS 2.4 07/08/2011 1404   LYMPHSABS 0.1* 07/24/2011 0540   LYMPHSABS 0.2* 07/08/2011 1404   MONOABS 0.3 07/24/2011 0540   MONOABS 0.4 07/08/2011 1404   EOSABS 0.1 07/24/2011 0540   EOSABS 0.0 07/08/2011 1404   BASOSABS 0.0  07/24/2011 0540   BASOSABS 0.0 07/08/2011 1404      Lab 07/29/11 0535 07/28/11 0545 07/27/11 0457 07/26/11 0335 07/25/11 0535 07/24/11 0540  WBC 3.6* 3.6* 3.6* 3.5* 3.4* --  HGB 8.2* 8.5* 8.6* 9.2* 8.7* --  HCT 23.9* 24.5* 24.8* 26.6* 25.2* --  PLT 137* 134* 130* 147* 133* --  MCV 90.5 90.1 89.5 89.6 89.7 --  MCH 31.1 31.3 31.0 31.0 31.0 --  MCHC 34.3 34.7 34.7 34.6 34.5 --  RDW 15.3 15.1 14.9 15.0 15.1 --  LYMPHSABS -- -- -- -- -- 0.1*  MONOABS -- -- -- -- -- 0.3  EOSABS -- -- -- -- -- 0.1  BASOSABS -- -- -- -- -- 0.0  BANDABS -- -- -- -- -- --    Lab 07/29/11 0535 07/28/11 0545 07/27/11 0457 07/26/11 0335 07/25/11 0535 07/24/11 0540  NA 141 140 140 136 136 --  K 3.2* 3.3* 3.3* 3.6 3.7 --  CL 110 110 109 106 106 --  CO2 22 23 22 21 23  --  GLUCOSE 91 89 115* 91 84 --  BUN 6 5* 7 7 8  --  CREATININE 1.35* 1.33* 1.35* 1.45* 1.37* --  CALCIUM 9.0 9.0 9.0 9.2 9.2 --  MG -- -- -- -- -- 2.5  Recent Results (from the past 240 hour(s))  CULTURE, BLOOD (ROUTINE X 2)     Status: Normal   Collection Time   07/19/11  7:36 PM      Component Value Range Status Comment   Specimen Description BLOOD LEFT ARM   Final    Special Requests BOTTLES DRAWN AEROBIC AND ANAEROBIC [facility]   Final    Setup Time [id number]   Final    Culture NO GROWTH 5 DAYS   Final    Report Status 07/26/2011 FINAL   Final   CULTURE, BLOOD (ROUTINE X 2)     Status: Normal   Collection Time   07/19/11  7:42 PM      Component Value Range Status Comment   Specimen Description BLOOD LEFT HAND   Final    Special Requests BOTTLES DRAWN AEROBIC AND ANAEROBIC 4CC   Final    Setup Time [id number]   Final    Culture NO GROWTH 5 DAYS   Final    Report Status 07/26/2011 FINAL   Final   URINE CULTURE     Status: Normal   Collection Time   07/20/11  9:35 AM      Component Value Range Status Comment   Specimen Description URINE, CLEAN CATCH   Final    Special Requests NONE   Final    Setup Time [id number]   Final     Colony Count NO GROWTH   Final    Culture NO GROWTH   Final    Report Status 07/21/2011 FINAL   Final   URINE CULTURE     Status: Normal   Collection Time   07/23/11  9:12 AM      Component Value Range Status Comment   Specimen Description URINE, RANDOM   Final    Special Requests NONE   Final    Setup Time [id number]   Final    Colony Count NO GROWTH   Final    Culture NO GROWTH   Final    Report Status 07/24/2011 FINAL   Final   CULTURE, SPUTUM-ASSESSMENT     Status: Normal   Collection Time   07/23/11 11:24 AM      Component Value Range Status Comment   Specimen Description SPUTUM   Final    Special Requests NONE   Final    Sputum evaluation     Final    Value: THIS SPECIMEN IS ACCEPTABLE. RESPIRATORY CULTURE REPORT TO FOLLOW.   Report Status 07/23/2011 FINAL   Final   CULTURE, RESPIRATORY     Status: Normal   Collection Time   07/23/11 11:24 AM      Component Value Range Status Comment   Specimen Description SPUTUM   Final    Special Requests NONE   Final    Gram Stain     Final    Value: RARE WBC PRESENT,BOTH PMN AND MONONUCLEAR     FEW SQUAMOUS EPITHELIAL CELLS PRESENT     NO ORGANISMS SEEN   Culture NORMAL OROPHARYNGEAL FLORA   Final    Report Status 07/25/2011 FINAL   Final   CULTURE, BLOOD (ROUTINE X 2)     Status: Normal (Preliminary result)   Collection Time   07/23/11  4:20 PM      Component Value Range Status Comment   Specimen Description BLOOD LEFT ARM   Final    Special Requests BOTTLES DRAWN AEROBIC AND ANAEROBIC 10CC   Final    Setup Time [id number]  Final    Culture     Final    Value:        BLOOD CULTURE RECEIVED NO GROWTH TO DATE CULTURE WILL BE HELD FOR 5 DAYS BEFORE ISSUING A FINAL NEGATIVE REPORT   Report Status PENDING   Incomplete   CULTURE, BLOOD (ROUTINE X 2)     Status: Normal (Preliminary result)   Collection Time   07/23/11  4:42 PM      Component Value Range Status Comment   Specimen Description BLOOD RIGHT ARM   Final    Special  Requests BOTTLES DRAWN AEROBIC AND ANAEROBIC 10CC   Final    Setup Time [id number]   Final    Culture     Final    Value:        BLOOD CULTURE RECEIVED NO GROWTH TO DATE CULTURE WILL BE HELD FOR 5 DAYS BEFORE ISSUING A FINAL NEGATIVE REPORT   Report Status PENDING   Incomplete   CULTURE, BLOOD (ROUTINE X 2)     Status: Normal (Preliminary result)   Collection Time   07/24/11  5:10 PM      Component Value Range Status Comment   Specimen Description BLOOD LEFT HAND   Final    Special Requests BOTTLES DRAWN AEROBIC AND ANAEROBIC 10 CC EACH   Final    Setup Time [id number]   Final    Culture     Final    Value:        BLOOD CULTURE RECEIVED NO GROWTH TO DATE CULTURE WILL BE HELD FOR 5 DAYS BEFORE ISSUING A FINAL NEGATIVE REPORT   Report Status PENDING   Incomplete   CULTURE, BLOOD (ROUTINE X 2)     Status: Normal (Preliminary result)   Collection Time   07/24/11  5:20 PM      Component Value Range Status Comment   Specimen Description BLOOD LEFT ARM   Final    Special Requests BOTTLES DRAWN AEROBIC AND ANAEROBIC 10 CC EACH   Final    Setup Time [id number]   Final    Culture     Final    Value:        BLOOD CULTURE RECEIVED NO GROWTH TO DATE CULTURE WILL BE HELD FOR 5 DAYS BEFORE ISSUING A FINAL NEGATIVE REPORT   Report Status PENDING   Incomplete   URINE CULTURE     Status: Normal   Collection Time   07/24/11  7:23 PM      Component Value Range Status Comment   Specimen Description URINE, CLEAN CATCH   Final    Special Requests NONE   Final    Setup Time [id number]   Final    Colony Count NO GROWTH   Final    Culture NO GROWTH   Final    Report Status 07/25/2011 FINAL   Final     Studies/Results:  Dg Chest 2 View 07/29/2011   IMPRESSION: Improved bilateral predominately upper lobe airspace disease. Resolved effusions.    Medications: Scheduled Meds:   . albuterol  2.5 mg Nebulization BID  . benzonatate  100 mg Oral TID  . docusate sodium  100 mg Oral BID  . gabapentin   100 mg Oral TID  . ipratropium  0.5 mg Nebulization BID  . levofloxacin (LEVAQUIN) IV  750 mg Intravenous Q48H  . levothyroxine  100 mcg Oral Q M,W,F  . levothyroxine  50 mcg Oral Q T,Th,S,Su  . lidocaine  4-5 mL Mouth/Throat Daily  . magnesium  chloride  1 tablet Oral Daily  . megestrol  200 mg Oral Daily  . oxyCODONE  20 mg Oral Q12H  . pantoprazole  40 mg Oral Daily  . piperacillin-tazobactam (ZOSYN)  IV  3.375 g Intravenous Q8H  . potassium chloride  40 mEq Oral Once  . vancomycin  750 mg Intravenous Q12H   Continuous Infusions:   . sodium chloride 75 mL/hr at 07/28/11 1700   PRN Meds:.acetaminophen, acetaminophen, albuterol, ALPRAZolam, alum & mag hydroxide-simeth, bisacodyl, emollient, guaiFENesin, HYDROcodone-acetaminophen, ipratropium, lidocaine-prilocaine, magic mouthwash, meclizine, morphine injection, ondansetron (ZOFRAN) IV, ondansetron, prochlorperazine, senna-docusate, sodium chloride, zolpidem  Assessment/Plan:  Principal Problem:  *PNA (pneumonia)  - pt showing slight clinical improvement and review of records indicate no fever spikes overnight and no desaturations - plan is to proceed with bronch (with BAL) tomorrow AM for further evaluation - will monitor vitals per floor protocol  - continue nebulizers prn and incentive spirometry  - appreciate PCCM and ID input  Active Problems:  GERD  - continue PPI   Weakness generalized  - PT evaluation   Pancytopenia  - secondary to MM and recent chemo/radiation therapy  - this has actually remained stable during the hospitalization  - will obtain CBC in AM   SOB (shortness of breath) on exertion  - please see principal problem   Hypokalemia  - continue to supplement if indicated   Fever  - afebrile over 24 hours - continue broad spectrum antibiotics for now  EDUCATION  - test results and diagnostic studies were discussed with patient  - patient verbalized the understanding  - questions were answered at  the bedside and contact information was provided for additional questions or concerns    LOS: 10 days   MAGICK-MYERS, ISKRA 07/29/2011, 9:01 AM  [facility] Pager: [phone]

## 2011-07-29 NOTE — Progress Notes (Signed)
ANTIBIOTIC CONSULT NOTE - FOLLOW UP  Pharmacy Consult for Vancomycin Indication: pneumonia, continued fevers  No Known Allergies  Patient Measurements: Height: 5\' 5"  (165.1 cm) Weight: 173 lb 4.5 oz (78.6 kg) IBW/kg (Calculated) : 57  Adjusted Body Weight:   Vital Signs:   Intake/Output from previous day:   Intake/Output from this shift:    Labs:  Basename 07/28/11 0545 07/27/11 0457 07/26/11 0335  WBC 3.6* 3.6* 3.5*  HGB 8.5* 8.6* 9.2*  PLT 134* 130* 147*  LABCREA -- -- --  CREATININE 1.33* 1.35* 1.45*   Estimated Creatinine Clearance: 46.6 ml/min (by C-G formula based on Cr of 1.33).  Basename 07/28/11 2230 07/26/11 0907  VANCOTROUGH 19.8 23.9*  VANCOPEAK -- --  Drue Dun -- --  GENTTROUGH -- --  GENTPEAK -- --  GENTRANDOM -- --  TOBRATROUGH -- --  TOBRAPEAK -- --  TOBRARND -- --  AMIKACINPEAK -- --  AMIKACINTROU -- --  AMIKACIN -- --     Microbiology: Recent Results (from the past 720 hour(s))  TECHNOLOGIST REVIEW     Status: Normal   Collection Time   07/04/11  8:42 AM      Component Value Range Status Comment   Technologist Review Occ Metas and Myelocytes present   Final   CULTURE, BLOOD (ROUTINE X 2)     Status: Normal   Collection Time   07/19/11  7:36 PM      Component Value Range Status Comment   Specimen Description BLOOD LEFT ARM   Final    Special Requests BOTTLES DRAWN AEROBIC AND ANAEROBIC [facility]   Final    Setup Time [id number]   Final    Culture NO GROWTH 5 DAYS   Final    Report Status 07/26/2011 FINAL   Final   CULTURE, BLOOD (ROUTINE X 2)     Status: Normal   Collection Time   07/19/11  7:42 PM      Component Value Range Status Comment   Specimen Description BLOOD LEFT HAND   Final    Special Requests BOTTLES DRAWN AEROBIC AND ANAEROBIC 4CC   Final    Setup Time [id number]   Final    Culture NO GROWTH 5 DAYS   Final    Report Status 07/26/2011 FINAL   Final   URINE CULTURE     Status: Normal   Collection Time   07/20/11  9:35  AM      Component Value Range Status Comment   Specimen Description URINE, CLEAN CATCH   Final    Special Requests NONE   Final    Setup Time [id number]   Final    Colony Count NO GROWTH   Final    Culture NO GROWTH   Final    Report Status 07/21/2011 FINAL   Final   URINE CULTURE     Status: Normal   Collection Time   07/23/11  9:12 AM      Component Value Range Status Comment   Specimen Description URINE, RANDOM   Final    Special Requests NONE   Final    Setup Time [id number]   Final    Colony Count NO GROWTH   Final    Culture NO GROWTH   Final    Report Status 07/24/2011 FINAL   Final   CULTURE, SPUTUM-ASSESSMENT     Status: Normal   Collection Time   07/23/11 11:24 AM      Component Value Range Status Comment   Specimen Description SPUTUM  Final    Special Requests NONE   Final    Sputum evaluation     Final    Value: THIS SPECIMEN IS ACCEPTABLE. RESPIRATORY CULTURE REPORT TO FOLLOW.   Report Status 07/23/2011 FINAL   Final   CULTURE, RESPIRATORY     Status: Normal   Collection Time   07/23/11 11:24 AM      Component Value Range Status Comment   Specimen Description SPUTUM   Final    Special Requests NONE   Final    Gram Stain     Final    Value: RARE WBC PRESENT,BOTH PMN AND MONONUCLEAR     FEW SQUAMOUS EPITHELIAL CELLS PRESENT     NO ORGANISMS SEEN   Culture NORMAL OROPHARYNGEAL FLORA   Final    Report Status 07/25/2011 FINAL   Final   CULTURE, BLOOD (ROUTINE X 2)     Status: Normal (Preliminary result)   Collection Time   07/23/11  4:20 PM      Component Value Range Status Comment   Specimen Description BLOOD LEFT ARM   Final    Special Requests BOTTLES DRAWN AEROBIC AND ANAEROBIC 10CC   Final    Setup Time [id number]   Final    Culture     Final    Value:        BLOOD CULTURE RECEIVED NO GROWTH TO DATE CULTURE WILL BE HELD FOR 5 DAYS BEFORE ISSUING A FINAL NEGATIVE REPORT   Report Status PENDING   Incomplete   CULTURE, BLOOD (ROUTINE X 2)     Status:  Normal (Preliminary result)   Collection Time   07/23/11  4:42 PM      Component Value Range Status Comment   Specimen Description BLOOD RIGHT ARM   Final    Special Requests BOTTLES DRAWN AEROBIC AND ANAEROBIC 10CC   Final    Setup Time [id number]   Final    Culture     Final    Value:        BLOOD CULTURE RECEIVED NO GROWTH TO DATE CULTURE WILL BE HELD FOR 5 DAYS BEFORE ISSUING A FINAL NEGATIVE REPORT   Report Status PENDING   Incomplete   CULTURE, BLOOD (ROUTINE X 2)     Status: Normal (Preliminary result)   Collection Time   07/24/11  5:10 PM      Component Value Range Status Comment   Specimen Description BLOOD LEFT HAND   Final    Special Requests BOTTLES DRAWN AEROBIC AND ANAEROBIC 10 CC EACH   Final    Setup Time [id number]   Final    Culture     Final    Value:        BLOOD CULTURE RECEIVED NO GROWTH TO DATE CULTURE WILL BE HELD FOR 5 DAYS BEFORE ISSUING A FINAL NEGATIVE REPORT   Report Status PENDING   Incomplete   CULTURE, BLOOD (ROUTINE X 2)     Status: Normal (Preliminary result)   Collection Time   07/24/11  5:20 PM      Component Value Range Status Comment   Specimen Description BLOOD LEFT ARM   Final    Special Requests BOTTLES DRAWN AEROBIC AND ANAEROBIC 10 CC EACH   Final    Setup Time [id number]   Final    Culture     Final    Value:        BLOOD CULTURE RECEIVED NO GROWTH TO DATE CULTURE WILL BE HELD FOR 5 DAYS BEFORE ISSUING  A FINAL NEGATIVE REPORT   Report Status PENDING   Incomplete   URINE CULTURE     Status: Normal   Collection Time   07/24/11  7:23 PM      Component Value Range Status Comment   Specimen Description URINE, CLEAN CATCH   Final    Special Requests NONE   Final    Setup Time [id number]   Final    Colony Count NO GROWTH   Final    Culture NO GROWTH   Final    Report Status 07/25/2011 FINAL   Final     Anti-infectives     Start     Dose/Rate Route Frequency Ordered Stop   07/27/11 1100   vancomycin (VANCOCIN) IVPB 1000 mg/200 mL  premix  Status:  Discontinued        1,000 mg 200 mL/hr over 60 Minutes Intravenous Every 12 hours 07/27/11 1003 07/27/11 1017   07/27/11 1100   piperacillin-tazobactam (ZOSYN) IVPB 2.25 g  Status:  Discontinued        2.25 g 100 mL/hr over 30 Minutes Intravenous Every 12 hours 07/27/11 1003 07/27/11 1018   07/27/11 1100   vancomycin (VANCOCIN) 750 mg in sodium chloride 0.9 % 150 mL IVPB        750 mg 150 mL/hr over 60 Minutes Intravenous Every 12 hours 07/27/11 1018     07/27/11 1030  piperacillin-tazobactam (ZOSYN) IVPB 3.375 g       3.375 g 12.5 mL/hr over 240 Minutes Intravenous Every 8 hours 07/27/11 1018     07/27/11 1000   Levofloxacin (LEVAQUIN) IVPB 750 mg        750 mg 100 mL/hr over 90 Minutes Intravenous Every 48 hours 07/25/11 1006     07/24/11 2200   vancomycin (VANCOCIN) 750 mg in sodium chloride 0.9 % 150 mL IVPB  Status:  Discontinued        750 mg 150 mL/hr over 60 Minutes Intravenous Every 12 hours 07/24/11 2015 07/26/11 1104   07/22/11 1030   Levofloxacin (LEVAQUIN) IVPB 750 mg  Status:  Discontinued        750 mg 100 mL/hr over 90 Minutes Intravenous Every 24 hours 07/22/11 0945 07/25/11 1006   07/20/11 0000   piperacillin-tazobactam (ZOSYN) IVPB 3.375 g  Status:  Discontinued        3.375 g 12.5 mL/hr over 240 Minutes Intravenous 4 times per day 07/19/11 2047 07/19/11 2115   07/19/11 2300   oseltamivir (TAMIFLU) capsule 75 mg  Status:  Discontinued        75 mg Oral 2 times daily 07/19/11 2047 07/21/11 1533   07/19/11 2000   vancomycin (VANCOCIN) IVPB 1000 mg/200 mL premix  Status:  Discontinued        1,000 mg 200 mL/hr over 60 Minutes Intravenous Every 12 hours 07/19/11 1931 07/24/11 2015   07/19/11 1415   cefTRIAXone (ROCEPHIN) 1 g in dextrose 5 % 50 mL IVPB        1 g 100 mL/hr over 30 Minutes Intravenous  Once 07/19/11 1407 07/19/11 1519   07/19/11 1415   azithromycin (ZITHROMAX) 500 mg in dextrose 5 % 250 mL IVPB        500 mg 250 mL/hr over  60 Minutes Intravenous  Once 07/19/11 1407 07/19/11 1636   07/19/11 0000   piperacillin-tazobactam (ZOSYN) IVPB 3.375 g  Status:  Discontinued        3.375 g 12.5 mL/hr over 240 Minutes Intravenous Every 8 hours 07/19/11  2116 07/26/11 1104          Assessment: Patient with vancomycin level at goal.  Goal of Therapy:  Vancomycin trough level 15-20 mcg/ml  Plan:  Measure antibiotic drug levels at steady state Follow up culture results Continue dose   Aleene Davidson Crowford 07/29/2011,3:23 AM

## 2011-07-29 NOTE — Progress Notes (Signed)
Physical Therapy Treatment Patient Details Name: [name] MRN: [id number] DOB: July 07, 1950 Today's Date: 07/29/2011  PT Assessment/Plan  PT - Assessment/Plan Comments on Treatment Session: Patient slowly improving.  Needs encouragement to participate despite saying she wants to walk.  Had to come back at end of day to see her per her request.  Encouraged 2x daily to improve symptoms of dizziness as well as LE strength. PT Plan: Discharge plan remains appropriate PT Frequency: Min 3X/week Follow Up Recommendations: Home health PT Equipment Recommended:  (already had RW delivered to room) PT Goals  Acute Rehab PT Goals Pt will go Sit to Stand: Independently PT Goal: Sit to Stand - Progress: Met Pt will go Stand to Sit: Independently PT Goal: Stand to Sit - Progress: Met Pt will Ambulate: 51 - 150 feet;with modified independence PT Goal: Ambulate - Progress: Progressing toward goal Pt will Perform Home Exercise Program: with supervision, verbal cues required/provided PT Goal: Perform Home Exercise Program - Progress: Not progressing (due to patient not performing this session with fatigue)  PT Treatment Precautions/Restrictions  Precautions Precautions: Fall Restrictions Weight Bearing Restrictions: No Mobility (including Balance) Bed Mobility Bed Mobility: Yes Supine to Sit: 6: Modified independent (Device/Increase time) Transfers Sit to Stand: 6: Modified independent (Device/Increase time);From toilet;From bed Stand to Sit: 6: Modified independent (Device/Increase time);To bed;To toilet Ambulation/Gait Ambulation/Gait Assistance: 4: Min assist Ambulation/Gait Assistance Details (indicate cue type and reason): cues for spotting targets with turns and for slow turns.  C/O LE weakness with noted buckling at end of session.  Dyspnea with ambulation such that patient only able to speak 4-5 words per breath. Ambulation Distance (Feet): 200 Feet Assistive device: None (pushing  IV pole) Gait Pattern: Decreased stride length Gait velocity: slow speed    Exercise  Other Exercises Other Exercises: declined VOR exercises due to fatigue, but states she does them daily End of Session PT - End of Session Activity Tolerance: Patient limited by fatigue Patient left: in bed;with call bell in reach;with family/visitor present General Behavior During Session: [facility] for tasks performed Cognition: [facility] for tasks performed  [facility] 07/29/2011, 4:55 PM

## 2011-07-29 NOTE — Progress Notes (Signed)
Subjective: No issues overnight.  No fever since last visit, no increased wob. cxr this am with slight improvement in infiltrates, and resolution of right effusion (?due to resolution of edema?) Pt's ESR, BNP, and LDH are all very elevated.   Objective: Vital signs in last 24 hours: Blood pressure 122/70, pulse 90, temperature 99.5 F (37.5 C), temperature source Oral, resp. rate 18, height 5\' 5"  (1.651 m), weight 77.6 kg (171 lb 1.2 oz), SpO2 95.00%.  Intake/Output from previous day:     Physical Exam:   ow female in nad Chest with upper lung zone crackles, no wheezing Cor with rrr abd benign  Alert and oriented, moves all 4.    Lab Results:  Basename 07/29/11 0535 07/28/11 0545 07/27/11 0457  WBC 3.6* 3.6* 3.6*  HGB 8.2* 8.5* 8.6*  HCT 23.9* 24.5* 24.8*  PLT 137* 134* 130*   BMET  Basename 07/29/11 0535 07/28/11 0545 07/27/11 0457  NA 141 140 140  K 3.2* 3.3* 3.3*  CL 110 110 109  CO2 22 23 22   GLUCOSE 91 89 115*  BUN 6 5* 7  CREATININE 1.35* 1.33* 1.35*  CALCIUM 9.0 9.0 9.0    Studies/Results: Dg Chest 2 View  07/29/2011  *RADIOLOGY REPORT*  Clinical Data: Short of breath  CHEST - 2 VIEW  Comparison: 07/26/2011  Findings: Stable right internal jugular vein Port-A-Cath.  Normal heart size.  Bilateral central and upper lobe heterogeneous opacities are improved.  No pneumothorax.  No pleural fluid. Bilateral effusions resolved.  IMPRESSION: Improved bilateral predominately upper lobe airspace disease. Resolved effusions.  Original Report Authenticated By: Donavan Burnet, M.D.    Assessment/Plan: Patient Active Hospital Problem List:  Pulmonary infiltrates in a pt with myeloma and h/o recent pancytopenia post chemotherapy.  Clinically, she is better from admit, and no fever the last 24 hrs.  Her cxr is a little better, but this may simply be due to resolution of edema and effusion.  She has been in the hospital a long time, and seems to have reached a plateau.  My  suspicion for opportunistic infection is not high, but I think we should go ahead and proceed with bronch with BAL in am to put issue to rest.  Will not pursue biopsy due to potential bleeding risk unless there is something noted intraop.  If bronch is negative, I wonder about potential hypersensitivity to chemo?  Bronch has been scheduled for in am.     Barbaraann Share, M.D. 07/29/2011, 8:33 AM

## 2011-07-29 NOTE — Progress Notes (Signed)
OT Note:  Pt has a headache; will check later if schedule permits.  [place], [place] [phone] 07/29/2011

## 2011-07-29 NOTE — Progress Notes (Signed)
OT Note:  Pt is feeling better, but she is having PT return at 4:00 today.  Will check back another day.  [place], [place] [phone] 07/29/2011

## 2011-07-30 ENCOUNTER — Inpatient Hospital Stay (HOSPITAL_COMMUNITY): Payer: Medicare HMO

## 2011-07-30 ENCOUNTER — Encounter (HOSPITAL_COMMUNITY): Payer: Self-pay | Admitting: Pulmonary Disease

## 2011-07-30 ENCOUNTER — Other Ambulatory Visit: Payer: Self-pay | Admitting: Pulmonary Disease

## 2011-07-30 ENCOUNTER — Encounter (HOSPITAL_COMMUNITY)
Admission: EM | Disposition: A | Discharge status: Home / Self Care | Payer: Self-pay | Source: Home / Self Care | Attending: Internal Medicine

## 2011-07-30 DIAGNOSIS — R918 Other nonspecific abnormal finding of lung field: Secondary | ICD-10-CM

## 2011-07-30 HISTORY — PX: VIDEO BRONCHOSCOPY: SHX5072

## 2011-07-30 LAB — CBC
MCHC: 34.1 g/dL (ref 30.0–36.0)
Platelets: 147 10*3/uL — ABNORMAL LOW (ref 150–400)
RDW: 14.9 % (ref 11.5–15.5)
WBC: 3.5 10*3/uL — ABNORMAL LOW (ref 4.0–10.5)

## 2011-07-30 LAB — BASIC METABOLIC PANEL
BUN: 6 mg/dL (ref 6–23)
GFR calc Af Amer: 46 mL/min — ABNORMAL LOW (ref >90)
GFR calc non Af Amer: 40 mL/min — ABNORMAL LOW (ref >90)
Potassium: 2.9 mEq/L — ABNORMAL LOW (ref 3.5–5.1)
Sodium: 141 mEq/L (ref 135–145)

## 2011-07-30 LAB — CULTURE, BLOOD (ROUTINE X 2)
Culture  Setup Time: 201301230043
Culture  Setup Time: 201301230043
Culture: NO GROWTH
Culture: NO GROWTH

## 2011-07-30 LAB — BODY FLUID CELL COUNT WITH DIFFERENTIAL
Eos, Fluid: 4 %
Monocyte-Macrophage-Serous Fluid: 49 % — ABNORMAL LOW (ref 50–90)

## 2011-07-30 LAB — PNEUMOCYSTIS JIROVECI SMEAR BY DFA: Pneumocystis jiroveci Ag: NEGATIVE

## 2011-07-30 SURGERY — VIDEO BRONCHOSCOPY WITHOUT FLUORO
Anesthesia: Moderate Sedation | Laterality: Bilateral

## 2011-07-30 MED ORDER — POTASSIUM CHLORIDE CRYS ER 20 MEQ PO TBCR
40.0000 meq | EXTENDED_RELEASE_TABLET | Freq: Two times a day (BID) | ORAL | Status: AC
  Administered 2011-07-30 (×2): 40 meq via ORAL
  Filled 2011-07-30 (×2): qty 2

## 2011-07-30 MED ORDER — MIDAZOLAM HCL 10 MG/2ML IJ SOLN
INTRAMUSCULAR | Status: DC | PRN
  Administered 2011-07-30: 5 mg via INTRAVENOUS
  Administered 2011-07-30: 2.5 mg via INTRAVENOUS

## 2011-07-30 MED ORDER — MEPERIDINE HCL 25 MG/ML IJ SOLN
INTRAMUSCULAR | Status: DC | PRN
  Administered 2011-07-30: 50 mg via INTRAVENOUS

## 2011-07-30 NOTE — Progress Notes (Signed)
Pt. Had a temp. Of 102.7 this morning, tylenol 650mg  given po, a set of blood cultures x 2 drawn as per standing order & MD. Notified.

## 2011-07-30 NOTE — Progress Notes (Signed)
S: had BAL this am.  Febrile to 102 today.    O: Filed Vitals:   07/30/11 1122  BP: 127/76  Pulse: 88  Temp: 98 F (36.7 C)  Resp: 18   Gen - NAD CV - RRR Lungs - CTAB  A/P - 61 yo with fever, opacities on CXR and continues on broad spectrum antibiotics.  1) Fever - PNA vs non-infectious cause.  will await results of BAL to see if other pathogens, not currently being covered. Otherwise, will start to taper antibiotics.  Patient remains hemodynamically stable.

## 2011-07-30 NOTE — Op Note (Signed)
Dictation # [id number]

## 2011-07-30 NOTE — Progress Notes (Signed)
Patient ID: [name], female   DOB: 1951-06-13, 61 y.o.   MRN: [id number]  Subjective: No events overnight. Patient denies chest pain, shortness of breath, abdominal pain. She reports overall improvement.  Objective:  Vital signs in last 24 hours:  Filed Vitals:   07/30/11 1003 07/30/11 1122 07/30/11 1406 07/30/11 1610  BP:  127/76 138/78   Pulse:  88 94   Temp:  98 F (36.7 C) 100.4 F (38 C) 102.8 F (39.3 C)  TempSrc:  Oral Axillary Oral  Resp:  18 18   Height:      Weight:    73.4 kg (161 lb 13.1 oz)  SpO2: 94% 96% 96%     Intake/Output from previous day:   Intake/Output Summary (Last 24 hours) at 07/30/11 2143 Last data filed at 07/30/11 1407  Gross per 24 hour  Intake    440 ml  Output      0 ml  Net    440 ml    Physical Exam: General: Alert, awake, oriented x3, in no acute distress. HEENT: No bruits, no goiter. Moist mucous membranes, no scleral icterus, no conjunctival pallor. Heart: Regular rate and rhythm, S1/S2 +, no murmurs, rubs, gallops. Lungs: Decreased breath sounds at bases with scattered rhonchi but now improved. No wheezing, no rales.  Abdomen: Soft, nontender, nondistended, positive bowel sounds. Extremities: No clubbing or cyanosis, no pitting edema,  positive pedal pulses. Neuro: Grossly nonfocal.  Lab Results:  Basic Metabolic Panel:    Component Value Date/Time   NA 141 07/30/2011 0445   K 2.9* 07/30/2011 0445   CL 110 07/30/2011 0445   CO2 22 07/30/2011 0445   BUN 6 07/30/2011 0445   CREATININE 1.40* 07/30/2011 0445   GLUCOSE 101* 07/30/2011 0445   CALCIUM 9.6 07/30/2011 0445   CBC:    Component Value Date/Time   WBC 3.5* 07/30/2011 0445   WBC 3.1* 07/08/2011 1404   HGB 8.8* 07/30/2011 0445   HGB 10.0* 07/08/2011 1404   HCT 25.8* 07/30/2011 0445   HCT 29.1* 07/08/2011 1404   PLT 147* 07/30/2011 0445   PLT 102* 07/08/2011 1404   MCV 89.0 07/30/2011 0445   MCV 92.4 07/08/2011 1404   NEUTROABS 2.4 07/24/2011 0540   NEUTROABS 2.4 07/08/2011  1404   LYMPHSABS 0.1* 07/24/2011 0540   LYMPHSABS 0.2* 07/08/2011 1404   MONOABS 0.3 07/24/2011 0540   MONOABS 0.4 07/08/2011 1404   EOSABS 0.1 07/24/2011 0540   EOSABS 0.0 07/08/2011 1404   BASOSABS 0.0 07/24/2011 0540   BASOSABS 0.0 07/08/2011 1404      Lab 07/30/11 0445 07/29/11 0535 07/28/11 0545 07/27/11 0457 07/26/11 0335 07/24/11 0540  WBC 3.5* 3.6* 3.6* 3.6* 3.5* --  HGB 8.8* 8.2* 8.5* 8.6* 9.2* --  HCT 25.8* 23.9* 24.5* 24.8* 26.6* --  PLT 147* 137* 134* 130* 147* --  MCV 89.0 90.5 90.1 89.5 89.6 --  MCH 30.3 31.1 31.3 31.0 31.0 --  MCHC 34.1 34.3 34.7 34.7 34.6 --  RDW 14.9 15.3 15.1 14.9 15.0 --  LYMPHSABS -- -- -- -- -- 0.1*  MONOABS -- -- -- -- -- 0.3  EOSABS -- -- -- -- -- 0.1  BASOSABS -- -- -- -- -- 0.0  BANDABS -- -- -- -- -- --    Lab 07/30/11 0445 07/29/11 0535 07/28/11 0545 07/27/11 0457 07/26/11 0335 07/24/11 0540  NA 141 141 140 140 136 --  K 2.9* 3.2* 3.3* 3.3* 3.6 --  CL 110 110 110 109 106 --  CO2 22 22 23 22 21  --  GLUCOSE 101* 91 89 115* 91 --  BUN 6 6 5* 7 7 --  CREATININE 1.40* 1.35* 1.33* 1.35* 1.45* --  CALCIUM 9.6 9.0 9.0 9.0 9.2 --  MG -- -- -- -- -- 2.5    Recent Results (from the past 240 hour(s))  URINE CULTURE     Status: Normal   Collection Time   07/23/11  9:12 AM      Component Value Range Status Comment   Specimen Description URINE, RANDOM   Final    Special Requests NONE   Final    Culture  Setup Time [id number]   Final    Colony Count NO GROWTH   Final    Culture NO GROWTH   Final    Report Status 07/24/2011 FINAL   Final   CULTURE, SPUTUM-ASSESSMENT     Status: Normal   Collection Time   07/23/11 11:24 AM      Component Value Range Status Comment   Specimen Description SPUTUM   Final    Special Requests NONE   Final    Sputum evaluation     Final    Value: THIS SPECIMEN IS ACCEPTABLE. RESPIRATORY CULTURE REPORT TO FOLLOW.   Report Status 07/23/2011 FINAL   Final   CULTURE, RESPIRATORY     Status: Normal   Collection Time    07/23/11 11:24 AM      Component Value Range Status Comment   Specimen Description SPUTUM   Final    Special Requests NONE   Final    Gram Stain     Final    Value: RARE WBC PRESENT,BOTH PMN AND MONONUCLEAR     FEW SQUAMOUS EPITHELIAL CELLS PRESENT     NO ORGANISMS SEEN   Culture NORMAL OROPHARYNGEAL FLORA   Final    Report Status 07/25/2011 FINAL   Final   CULTURE, BLOOD (ROUTINE X 2)     Status: Normal   Collection Time   07/23/11  4:20 PM      Component Value Range Status Comment   Specimen Description BLOOD LEFT ARM   Final    Special Requests BOTTLES DRAWN AEROBIC AND ANAEROBIC 10CC   Final    Culture  Setup Time [id number]   Final    Culture NO GROWTH 5 DAYS   Final    Report Status 07/30/2011 FINAL   Final   CULTURE, BLOOD (ROUTINE X 2)     Status: Normal   Collection Time   07/23/11  4:42 PM      Component Value Range Status Comment   Specimen Description BLOOD RIGHT ARM   Final    Special Requests BOTTLES DRAWN AEROBIC AND ANAEROBIC 10CC   Final    Culture  Setup Time [id number]   Final    Culture NO GROWTH 5 DAYS   Final    Report Status 07/30/2011 FINAL   Final   CULTURE, BLOOD (ROUTINE X 2)     Status: Normal (Preliminary result)   Collection Time   07/24/11  5:10 PM      Component Value Range Status Comment   Specimen Description BLOOD LEFT HAND   Final    Special Requests BOTTLES DRAWN AEROBIC AND ANAEROBIC 10 CC EACH   Final    Culture  Setup Time [id number]   Final    Culture     Final    Value:        BLOOD CULTURE RECEIVED NO GROWTH TO  DATE CULTURE WILL BE HELD FOR 5 DAYS BEFORE ISSUING A FINAL NEGATIVE REPORT   Report Status PENDING   Incomplete   CULTURE, BLOOD (ROUTINE X 2)     Status: Normal (Preliminary result)   Collection Time   07/24/11  5:20 PM      Component Value Range Status Comment   Specimen Description BLOOD LEFT ARM   Final    Special Requests BOTTLES DRAWN AEROBIC AND ANAEROBIC 10 CC EACH   Final    Culture  Setup Time [id number]    Final    Culture     Final    Value:        BLOOD CULTURE RECEIVED NO GROWTH TO DATE CULTURE WILL BE HELD FOR 5 DAYS BEFORE ISSUING A FINAL NEGATIVE REPORT   Report Status PENDING   Incomplete   URINE CULTURE     Status: Normal   Collection Time   07/24/11  7:23 PM      Component Value Range Status Comment   Specimen Description URINE, CLEAN CATCH   Final    Special Requests NONE   Final    Culture  Setup Time [id number]   Final    Colony Count NO GROWTH   Final    Culture NO GROWTH   Final    Report Status 07/25/2011 FINAL   Final   PNEUMOCYSTIS JIROVECI SMEAR BY DFA     Status: Normal   Collection Time   07/30/11  8:33 AM      Component Value Range Status Comment   Specimen Source-PJSRC BRONCHIAL ALVEOLAR LAVAGE   Final    Pneumocystis jiroveci Ag NEGATIVE   Final Performed at [facility]    Studies/Results: Dg Chest 2 View 08/13/2011  IMPRESSION: Improved bilateral predominately upper lobe airspace disease. Resolved effusions.     Medications: Scheduled Meds:   . albuterol  2.5 mg Nebulization BID  . benzonatate  100 mg Oral TID  . docusate sodium  100 mg Oral BID  . gabapentin  100 mg Oral TID  . ipratropium  0.5 mg Nebulization BID  . levofloxacin (LEVAQUIN) IV  750 mg Intravenous Q48H  . levothyroxine  100 mcg Oral Q M,W,F  . levothyroxine  50 mcg Oral Q T,Th,S,Su  . lidocaine  4-5 mL Mouth/Throat Daily  . magnesium chloride  1 tablet Oral Daily  . megestrol  200 mg Oral Daily  . oxyCODONE  20 mg Oral Q12H  . pantoprazole  40 mg Oral Daily  . piperacillin-tazobactam (ZOSYN)  IV  3.375 g Intravenous Q8H  . potassium chloride  40 mEq Oral BID  . vancomycin  750 mg Intravenous Q12H   Continuous Infusions:  PRN Meds:.acetaminophen, acetaminophen, albuterol, ALPRAZolam, alum & mag hydroxide-simeth, bisacodyl, emollient, guaiFENesin, HYDROcodone-acetaminophen, ipratropium, lidocaine-prilocaine, magic mouthwash, meclizine, morphine injection, ondansetron  (ZOFRAN) IV, ondansetron, prochlorperazine, senna-docusate, sodium chloride, zolpidem, DISCONTD: meperidine, DISCONTD: midazolam  Assessment/Plan:  Principal Problem:  *PNA (pneumonia)  - pt showing slight clinical improvement but continues to spike fevers overnight - now s/p bronch and BAL results still pending - will monitor vitals per floor protocol  - continue nebulizers prn and incentive spirometry, continue broad spectrum antibiotics but would consider fungal coverage given immunocompromised status and persistent fever - ask ID for considering fungal coverage - appreciate PCCM and ID input   Active Problems:  GERD  - continue PPI   Weakness generalized  - PT evaluation   Pancytopenia  - secondary to MM and recent chemo/radiation  therapy  - this has actually remained stable during the hospitalization  - will obtain CBC in AM   SOB (shortness of breath) on exertion  - please see principal problem   Hypokalemia  - continue to supplement and obtain BMP in AM  Fever  - see principal problem - continue broad spectrum antibiotics for now   EDUCATION  - test results and diagnostic studies were discussed with patient  - patient verbalized the understanding  - questions were answered at the bedside and contact information was provided for additional questions or concerns    LOS: 11 days   MAGICK-Josia Cueva 07/30/2011, 9:43 PM  [facility] Pager: [phone]

## 2011-07-30 NOTE — Progress Notes (Signed)
   CARE MANAGEMENT NOTE 07/30/2011  Patient:  [name], [name]   Account Number:  [id number]  Date Initiated:  07/22/2011  Documentation initiated by:  Lanier Clam  Subjective/Objective Assessment:   ADMITTED W/GENERALIZED Arnette Norris.HX: MULTIPLE MYELOMA.     Action/Plan:   FROM HOME W/SPOUSE.ONCOLOGY FOLLOWING.   Anticipated DC Date:  08/01/2011   Anticipated DC Plan:  HOME W HOME HEALTH SERVICES         Choice offered to / List presented to:  C-1 Patient   DME arranged  [facility]  3-N-1      DME agency  [facility]     HH arranged  HH-1 RN  HH-2 PT  HH-3 OT  HH-4 NURSE'S AIDE      HH agency  [facility]   Status of service:  In process, will continue to follow Medicare Important Message given?   (If response is "NO", the following Medicare IM given date fields will be blank) Date Medicare IM given:   Date Additional Medicare IM given:    Discharge Disposition:    Per UR Regulation:  Reviewed for med. necessity/level of care/duration of stay  Comments:  07/30/11 Rmani Kapusta RN,BSN NCM [phone] S/P BRONCHOSCOPY.[facility] FOLLOWING FOR HH.  07/25/11 Viviano Bir RN,BSN NCM [phone] [facility] FOLLOWING FOR HH. WILL NEED ORDERS FOR RW,3N1 TO FAX TO [phone] WILL DELIVER TO HOSPITAL.  07/22/11 Adit Riddles RN,BSN NCM [phone]

## 2011-07-30 NOTE — Op Note (Signed)
[name], [name]            ACCOUNT NO.:  [id number]  MEDICAL RECORD NO.:  [id number]  LOCATION:  1509                         FACILITY:  [facility]  PHYSICIAN:  Barbaraann Share, MD,FCCPDATE OF BIRTH:  10[birth year]  DATE OF PROCEDURE:  07/29/2011 DATE OF DISCHARGE:                              OPERATIVE REPORT   PROCEDURE:  Flexible video fiberoptic bronchoscopy with bronchoalveolar lavage.  INDICATION FOR THE PROCEDURE:  Pulmonary infiltrates in a patient who is immunosuppressed and not responding to IV antibiotics.  OPERATOR:  Barbaraann Share, MD, [facility]  ANESTHESIA:  Versed 7.5 mg IV in various aliquots, Demerol 50 mg IV, and topical 1% lidocaine to vocal cords and airways during the procedure.  DESCRIPTION:  After obtaining informed consent and under close cardiopulmonary monitoring, the above preop anesthesia was given and the fiberoptic scope was passed at the right naris into the posterior pharynx where there was no lesions or other abnormalities seen.  Vocal cords appeared to be within normal limits and moved bilaterally on phonation.  Scope was then passed into the trachea where it was examined along its entire length down to the level of the carina.  The carina was sharp and free of distortion.  The left and right tracheobronchial trees were examined serially to the subsegmental level with no endobronchial abnormality being found.  There were some thick nonpurulent-appearing tenacious secretions that were easily suctioned for airway exam.  The scope was then passed into the right upper lobe where bronchoalveolar lavage was done from the anterior segment of the right upper lobe as well as the apical segment of the right upper lobe with good return.  A BAL was also done from the apical posterior segment of the left upper lobe with good return of fluid as well.  The specimens will be sent for the usual cytologic and bacteriologic evaluation.  We will also look  for opportunistic pathogens given the patient's immunosuppressed status and lack of response to antibiotics.  Overall, the patient tolerated the procedure quite well and there were no complications.     Barbaraann Share, MD,FCCP     [facility]  D:  07/30/2011  T:  07/30/2011  Job:  [id number]

## 2011-07-30 NOTE — Progress Notes (Signed)
OT Note:  Pt had test earlier and is now resting.  Will check back another time.  [place], [place] [phone] 07/30/2011

## 2011-07-31 ENCOUNTER — Encounter (HOSPITAL_COMMUNITY): Payer: Self-pay

## 2011-07-31 ENCOUNTER — Encounter (HOSPITAL_COMMUNITY): Payer: Self-pay | Admitting: Pulmonary Disease

## 2011-07-31 LAB — CULTURE, BLOOD (ROUTINE X 2)
Culture  Setup Time: 201301240119
Culture: NO GROWTH

## 2011-07-31 LAB — BASIC METABOLIC PANEL
CO2: 24 mEq/L (ref 19–32)
Chloride: 107 mEq/L (ref 96–112)
Sodium: 140 mEq/L (ref 135–145)

## 2011-07-31 LAB — CBC
Platelets: 118 10*3/uL — ABNORMAL LOW (ref 150–400)
RBC: 2.56 MIL/uL — ABNORMAL LOW (ref 3.87–5.11)
WBC: 3.7 10*3/uL — ABNORMAL LOW (ref 4.0–10.5)

## 2011-07-31 MED FILL — Meperidine HCl Inj 100 MG/ML: INTRAMUSCULAR | Qty: 2 | Status: AC

## 2011-07-31 NOTE — Progress Notes (Signed)
Patient discussed at the Long Length of Stay Mehki Klumpp Weeks 07/31/2011  

## 2011-07-31 NOTE — Progress Notes (Signed)
INFECTIOUS DISEASE PROGRESS NOTE  ID: [name] is a 61 y.o. female with multiple myeloma with palliative radiation and chemo, most recently with Velcade and Doxil here with SOB with exertion.  Presumed pneumonia with fever and CXR findings but has had persistent fever, almost daily.  After further discussion with the patient, the SOB really dates back 2 weeks prior to admission as well as subjective fever.    Subjective: She continues to feel SOB but better.  Feels the fever coming on when it does.    Abtx:  Anti-infectives     Start     Dose/Rate Route Frequency Ordered Stop   07/27/11 1100   vancomycin (VANCOCIN) IVPB 1000 mg/200 mL premix  Status:  Discontinued        1,000 mg 200 mL/hr over 60 Minutes Intravenous Every 12 hours 07/27/11 1003 07/27/11 1017   07/27/11 1100   piperacillin-tazobactam (ZOSYN) IVPB 2.25 g  Status:  Discontinued        2.25 g 100 mL/hr over 30 Minutes Intravenous Every 12 hours 07/27/11 1003 07/27/11 1018   07/27/11 1100   vancomycin (VANCOCIN) 750 mg in sodium chloride 0.9 % 150 mL IVPB  Status:  Discontinued        750 mg 150 mL/hr over 60 Minutes Intravenous Every 12 hours 07/27/11 1018 07/31/11 1128   07/27/11 1030   piperacillin-tazobactam (ZOSYN) IVPB 3.375 g  Status:  Discontinued        3.375 g 12.5 mL/hr over 240 Minutes Intravenous Every 8 hours 07/27/11 1018 07/31/11 1128   07/27/11 1000   Levofloxacin (LEVAQUIN) IVPB 750 mg        750 mg 100 mL/hr over 90 Minutes Intravenous Every 48 hours 07/25/11 1006     07/24/11 2200   vancomycin (VANCOCIN) 750 mg in sodium chloride 0.9 % 150 mL IVPB  Status:  Discontinued        750 mg 150 mL/hr over 60 Minutes Intravenous Every 12 hours 07/24/11 2015 07/26/11 1104   07/22/11 1030   Levofloxacin (LEVAQUIN) IVPB 750 mg  Status:  Discontinued        750 mg 100 mL/hr over 90 Minutes Intravenous Every 24 hours 07/22/11 0945 07/25/11 1006   07/20/11 0000   piperacillin-tazobactam (ZOSYN) IVPB  3.375 g  Status:  Discontinued        3.375 g 12.5 mL/hr over 240 Minutes Intravenous 4 times per day 07/19/11 2047 07/19/11 2115   07/19/11 2300   oseltamivir (TAMIFLU) capsule 75 mg  Status:  Discontinued        75 mg Oral 2 times daily 07/19/11 2047 07/21/11 1533   07/19/11 2000   vancomycin (VANCOCIN) IVPB 1000 mg/200 mL premix  Status:  Discontinued        1,000 mg 200 mL/hr over 60 Minutes Intravenous Every 12 hours 07/19/11 1931 07/24/11 2015   07/19/11 1415   cefTRIAXone (ROCEPHIN) 1 g in dextrose 5 % 50 mL IVPB        1 g 100 mL/hr over 30 Minutes Intravenous  Once 07/19/11 1407 07/19/11 1519   07/19/11 1415   azithromycin (ZITHROMAX) 500 mg in dextrose 5 % 250 mL IVPB        500 mg 250 mL/hr over 60 Minutes Intravenous  Once 07/19/11 1407 07/19/11 1636   07/19/11 0000   piperacillin-tazobactam (ZOSYN) IVPB 3.375 g  Status:  Discontinued        3.375 g 12.5 mL/hr over 240 Minutes Intravenous Every 8 hours  07/19/11 2116 07/26/11 1104          Medications: I have reviewed the patient's current medications.  Objective: Vital signs in last 24 hours: Temp:  [98.5 F (36.9 C)-102.8 F (39.3 C)] 98.5 F (36.9 C) (01/30 0552) Pulse Rate:  [75-94] 75  (01/30 0552) Resp:  [18] 18  (01/30 0552) BP: (131-138)/(68-78) 132/69 mmHg (01/30 0552) SpO2:  [96 %-99 %] 97 % (01/30 1023) Weight:  [161 lb 13.1 oz (73.4 kg)] 161 lb 13.1 oz (73.4 kg) (01/29 1610)   General appearance: alert, cooperative, fatigued and no distress Resp: clear to auscultation bilaterally Cardio: regular rate and rhythm, S1, S2 normal, no murmur, click, rub or gallop GI: soft, non-tender; bowel sounds normal; no masses,  no organomegaly Extremities: extremities normal, atraumatic, no cyanosis or edema Skin: port site c/d, no erythema  Lab Results  Basename 07/31/11 0520 07/30/11 0445  WBC 3.7* 3.5*  HGB 7.8* 8.8*  HCT 22.8* 25.8*  NA 140 141  K 3.2* 2.9*  CL 107 110  CO2 24 22  BUN 6 6    CREATININE 1.38* 1.40*  GLU -- --   Liver Panel No results found for this basename: PROT:2,ALBUMIN:2,AST:2,ALT:2,ALKPHOS:2,BILITOT:2,BILIDIR:2,IBILI:2 in the last 72 hours Sedimentation Rate  Basename 07/28/11 1715  ESRSEDRATE 131*   C-Reactive Protein No results found for this basename: CRP:2 in the last 72 hours  Microbiology: Recent Results (from the past 240 hour(s))  URINE CULTURE     Status: Normal   Collection Time   07/23/11  9:12 AM      Component Value Range Status Comment   Specimen Description URINE, RANDOM   Final    Special Requests NONE   Final    Culture  Setup Time [id number]   Final    Colony Count NO GROWTH   Final    Culture NO GROWTH   Final    Report Status 07/24/2011 FINAL   Final   CULTURE, SPUTUM-ASSESSMENT     Status: Normal   Collection Time   07/23/11 11:24 AM      Component Value Range Status Comment   Specimen Description SPUTUM   Final    Special Requests NONE   Final    Sputum evaluation     Final    Value: THIS SPECIMEN IS ACCEPTABLE. RESPIRATORY CULTURE REPORT TO FOLLOW.   Report Status 07/23/2011 FINAL   Final   CULTURE, RESPIRATORY     Status: Normal   Collection Time   07/23/11 11:24 AM      Component Value Range Status Comment   Specimen Description SPUTUM   Final    Special Requests NONE   Final    Gram Stain     Final    Value: RARE WBC PRESENT,BOTH PMN AND MONONUCLEAR     FEW SQUAMOUS EPITHELIAL CELLS PRESENT     NO ORGANISMS SEEN   Culture NORMAL OROPHARYNGEAL FLORA   Final    Report Status 07/25/2011 FINAL   Final   CULTURE, BLOOD (ROUTINE X 2)     Status: Normal   Collection Time   07/23/11  4:20 PM      Component Value Range Status Comment   Specimen Description BLOOD LEFT ARM   Final    Special Requests BOTTLES DRAWN AEROBIC AND ANAEROBIC 10CC   Final    Culture  Setup Time [id number]   Final    Culture NO GROWTH 5 DAYS   Final    Report Status 07/30/2011 FINAL   Final  CULTURE, BLOOD (ROUTINE X 2)     Status:  Normal   Collection Time   07/23/11  4:42 PM      Component Value Range Status Comment   Specimen Description BLOOD RIGHT ARM   Final    Special Requests BOTTLES DRAWN AEROBIC AND ANAEROBIC 10CC   Final    Culture  Setup Time [id number]   Final    Culture NO GROWTH 5 DAYS   Final    Report Status 07/30/2011 FINAL   Final   CULTURE, BLOOD (ROUTINE X 2)     Status: Normal   Collection Time   07/24/11  5:10 PM      Component Value Range Status Comment   Specimen Description BLOOD LEFT HAND   Final    Special Requests BOTTLES DRAWN AEROBIC AND ANAEROBIC 10 CC EACH   Final    Culture  Setup Time [id number]   Final    Culture NO GROWTH 5 DAYS   Final    Report Status 07/31/2011 FINAL   Final   CULTURE, BLOOD (ROUTINE X 2)     Status: Normal   Collection Time   07/24/11  5:20 PM      Component Value Range Status Comment   Specimen Description BLOOD LEFT ARM   Final    Special Requests BOTTLES DRAWN AEROBIC AND ANAEROBIC 10 CC EACH   Final    Culture  Setup Time [id number]   Final    Culture NO GROWTH 5 DAYS   Final    Report Status 07/31/2011 FINAL   Final   URINE CULTURE     Status: Normal   Collection Time   07/24/11  7:23 PM      Component Value Range Status Comment   Specimen Description URINE, CLEAN CATCH   Final    Special Requests NONE   Final    Culture  Setup Time [id number]   Final    Colony Count NO GROWTH   Final    Culture NO GROWTH   Final    Report Status 07/25/2011 FINAL   Final   CULTURE, BLOOD (ROUTINE X 2)     Status: Normal (Preliminary result)   Collection Time   07/30/11  6:40 AM      Component Value Range Status Comment   Specimen Description BLOOD RIGHT ARM   Final    Special Requests BOTTLES DRAWN AEROBIC AND ANAEROBIC 10CC   Final    Culture  Setup Time [id number]   Final    Culture     Final    Value:        BLOOD CULTURE RECEIVED NO GROWTH TO DATE CULTURE WILL BE HELD FOR 5 DAYS BEFORE ISSUING A FINAL NEGATIVE REPORT   Report Status PENDING    Incomplete   CULTURE, RESPIRATORY     Status: Normal (Preliminary result)   Collection Time   07/30/11  8:33 AM      Component Value Range Status Comment   Specimen Description BRONCHIAL ALVEOLAR LAVAGE   Final    Special Requests NONE   Final    Gram Stain     Final    Value: NO WBC SEEN     NO SQUAMOUS EPITHELIAL CELLS SEEN     NO ORGANISMS SEEN   Culture FEW YEAST CONSISTENT WITH CANDIDA SPECIES   Final    Report Status PENDING   Incomplete   PNEUMOCYSTIS JIROVECI SMEAR BY DFA     Status: Normal   Collection Time  07/30/11  8:33 AM      Component Value Range Status Comment   Specimen Source-[facility] BRONCHIAL ALVEOLAR LAVAGE   Final    Pneumocystis jiroveci Ag NEGATIVE   Final Performed at [facility]  CULTURE, BLOOD (ROUTINE X 2)     Status: Normal (Preliminary result)   Collection Time   07/30/11 10:22 AM      Component Value Range Status Comment   Specimen Description BLOOD LEFT ARM   Final    Special Requests BOTTLES DRAWN AEROBIC AND ANAEROBIC [facility]   Final    Culture  Setup Time [id number]   Final    Culture     Final    Value:        BLOOD CULTURE RECEIVED NO GROWTH TO DATE CULTURE WILL BE HELD FOR 5 DAYS BEFORE ISSUING A FINAL NEGATIVE REPORT   Report Status PENDING   Incomplete     Studies/Results: No results found.   Assessment/Plan: 1) Pneumonia with fever - BAL results noted.  The were no organisms on gram stain or WBCs.  No growth to date.  Candida noted but not cause of pneumonia.  With the negative results, I will taper the antibiotics to levaquin monotherapy, no role for vanco or zosyn.  Potentially could be an atypical organism with the prolonged course.  However, based on the negative BAL to date and prolonged SOB, this is more likely related to the underlying illness or treatment causing the fever and symptoms.  It does not appear to be infectious.  Therefore, I would treat with levaquin about 2 more days and stop.  I have discussed this at length  with the patient.  It may also be worth discussing with Dr. Gwenyth Bouillon of oncology.    Marisela Line Infectious Diseases 07/31/2011, 11:38 AM

## 2011-07-31 NOTE — Progress Notes (Signed)
PT Cancellation Note  Treatment cancelled today due to patient's refusal to participate.  Patient just back from walk with daughter.  Encouraged progression of X1 viewing exercises.  WYNN,CYNDI 07/31/2011, 4:11 PM

## 2011-07-31 NOTE — Progress Notes (Signed)
Subjective: Feels better. Had episode of postural dizziness/vomited once this AM.  Objective: Vital signs in last 24 hours: Temp:  [98 F (36.7 C)-102.8 F (39.3 C)] 98 F (36.7 C) (01/30 1440) Pulse Rate:  [75-93] 84  (01/30 1440) Resp:  [18] 18  (01/30 1440) BP: (115-132)/(67-69) 115/67 mmHg (01/30 1440) SpO2:  [96 %-99 %] 96 % (01/30 1440) Weight:  [73.4 kg (161 lb 13.1 oz)] 73.4 kg (161 lb 13.1 oz) (01/29 1610) Weight change:  Last BM Date: 07/30/11  Intake/Output from previous day: 01/29 0701 - 01/30 0700 In: 440 [P.O.:240; IV Piggyback:200] Out: -  Total I/O In: 75 [P.O.:75] Out: -    Physical Exam: General: Comfortable, alert, communicative, fully oriented, not short of breath at rest.  HEENT:  Mild clinical pallor, no jaundice, no conjunctival injection or discharge. NECK:  Supple, JVP not seen, no carotid bruits, no palpable lymphadenopathy, no palpable goiter. CHEST:  Clinically clear to auscultation, no wheezes, no crackles. HEART:  Sounds 1 and 2 heard, normal, regular, no murmurs. ABDOMEN:  Full, soft, non-tender, no palpable organomegaly, no palpable masses, normal bowel sounds. GENITALIA:  Not examined. LOWER EXTREMITIES:  No pitting edema, palpable peripheral pulses. MUSCULOSKELETAL SYSTEM:  Unremarkable. CENTRAL NERVOUS SYSTEM:  No focal neurologic deficit on gross examination.  Lab Results:  Basename 07/31/11 0520 07/30/11 0445  WBC 3.7* 3.5*  HGB 7.8* 8.8*  HCT 22.8* 25.8*  PLT 118* 147*    Basename 07/31/11 0520 07/30/11 0445  NA 140 141  K 3.2* 2.9*  CL 107 110  CO2 24 22  GLUCOSE 87 101*  BUN 6 6  CREATININE 1.38* 1.40*  CALCIUM 8.9 9.6   Recent Results (from the past 240 hour(s))  URINE CULTURE     Status: Normal   Collection Time   07/23/11  9:12 AM      Component Value Range Status Comment   Specimen Description URINE, RANDOM   Final    Special Requests NONE   Final    Culture  Setup Time [id number]   Final    Colony Count NO  GROWTH   Final    Culture NO GROWTH   Final    Report Status 07/24/2011 FINAL   Final   CULTURE, SPUTUM-ASSESSMENT     Status: Normal   Collection Time   07/23/11 11:24 AM      Component Value Range Status Comment   Specimen Description SPUTUM   Final    Special Requests NONE   Final    Sputum evaluation     Final    Value: THIS SPECIMEN IS ACCEPTABLE. RESPIRATORY CULTURE REPORT TO FOLLOW.   Report Status 07/23/2011 FINAL   Final   CULTURE, RESPIRATORY     Status: Normal   Collection Time   07/23/11 11:24 AM      Component Value Range Status Comment   Specimen Description SPUTUM   Final    Special Requests NONE   Final    Gram Stain     Final    Value: RARE WBC PRESENT,BOTH PMN AND MONONUCLEAR     FEW SQUAMOUS EPITHELIAL CELLS PRESENT     NO ORGANISMS SEEN   Culture NORMAL OROPHARYNGEAL FLORA   Final    Report Status 07/25/2011 FINAL   Final   CULTURE, BLOOD (ROUTINE X 2)     Status: Normal   Collection Time   07/23/11  4:20 PM      Component Value Range Status Comment   Specimen Description BLOOD LEFT  ARM   Final    Special Requests BOTTLES DRAWN AEROBIC AND ANAEROBIC 10CC   Final    Culture  Setup Time [id number]   Final    Culture NO GROWTH 5 DAYS   Final    Report Status 07/30/2011 FINAL   Final   CULTURE, BLOOD (ROUTINE X 2)     Status: Normal   Collection Time   07/23/11  4:42 PM      Component Value Range Status Comment   Specimen Description BLOOD RIGHT ARM   Final    Special Requests BOTTLES DRAWN AEROBIC AND ANAEROBIC 10CC   Final    Culture  Setup Time [id number]   Final    Culture NO GROWTH 5 DAYS   Final    Report Status 07/30/2011 FINAL   Final   CULTURE, BLOOD (ROUTINE X 2)     Status: Normal   Collection Time   07/24/11  5:10 PM      Component Value Range Status Comment   Specimen Description BLOOD LEFT HAND   Final    Special Requests BOTTLES DRAWN AEROBIC AND ANAEROBIC 10 CC EACH   Final    Culture  Setup Time [id number]   Final    Culture NO GROWTH  5 DAYS   Final    Report Status 07/31/2011 FINAL   Final   CULTURE, BLOOD (ROUTINE X 2)     Status: Normal   Collection Time   07/24/11  5:20 PM      Component Value Range Status Comment   Specimen Description BLOOD LEFT ARM   Final    Special Requests BOTTLES DRAWN AEROBIC AND ANAEROBIC 10 CC EACH   Final    Culture  Setup Time [id number]   Final    Culture NO GROWTH 5 DAYS   Final    Report Status 07/31/2011 FINAL   Final   URINE CULTURE     Status: Normal   Collection Time   07/24/11  7:23 PM      Component Value Range Status Comment   Specimen Description URINE, CLEAN CATCH   Final    Special Requests NONE   Final    Culture  Setup Time [id number]   Final    Colony Count NO GROWTH   Final    Culture NO GROWTH   Final    Report Status 07/25/2011 FINAL   Final   CULTURE, BLOOD (ROUTINE X 2)     Status: Normal (Preliminary result)   Collection Time   07/30/11  6:40 AM      Component Value Range Status Comment   Specimen Description BLOOD RIGHT ARM   Final    Special Requests BOTTLES DRAWN AEROBIC AND ANAEROBIC 10CC   Final    Culture  Setup Time [id number]   Final    Culture     Final    Value:        BLOOD CULTURE RECEIVED NO GROWTH TO DATE CULTURE WILL BE HELD FOR 5 DAYS BEFORE ISSUING A FINAL NEGATIVE REPORT   Report Status PENDING   Incomplete   CULTURE, RESPIRATORY     Status: Normal (Preliminary result)   Collection Time   07/30/11  8:33 AM      Component Value Range Status Comment   Specimen Description BRONCHIAL ALVEOLAR LAVAGE   Final    Special Requests NONE   Final    Gram Stain     Final    Value: NO WBC SEEN  NO SQUAMOUS EPITHELIAL CELLS SEEN     NO ORGANISMS SEEN   Culture FEW YEAST CONSISTENT WITH CANDIDA SPECIES   Final    Report Status PENDING   Incomplete   AFB CULTURE WITH SMEAR     Status: Normal (Preliminary result)   Collection Time   07/30/11  8:33 AM      Component Value Range Status Comment   Specimen Description BRONCHIAL ALVEOLAR LAVAGE    Final    Special Requests Immunocompromised   Final    ACID FAST SMEAR NO ACID FAST BACILLI SEEN   Final    Culture     Final    Value: CULTURE WILL BE EXAMINED FOR 6 WEEKS BEFORE ISSUING A FINAL REPORT   Report Status PENDING   Incomplete   FUNGUS CULTURE W SMEAR     Status: Normal (Preliminary result)   Collection Time   07/30/11  8:33 AM      Component Value Range Status Comment   Specimen Description BRONCHIAL ALVEOLAR LAVAGE   Final    Special Requests Immunocompromised   Final    Fungal Smear NO YEAST OR FUNGAL ELEMENTS SEEN   Final    Culture CULTURE IN PROGRESS FOR FOUR WEEKS   Final    Report Status PENDING   Incomplete   PNEUMOCYSTIS JIROVECI SMEAR BY DFA     Status: Normal   Collection Time   07/30/11  8:33 AM      Component Value Range Status Comment   Specimen Source-[facility] BRONCHIAL ALVEOLAR LAVAGE   Final    Pneumocystis jiroveci Ag NEGATIVE   Final Performed at [facility]  CULTURE, BLOOD (ROUTINE X 2)     Status: Normal (Preliminary result)   Collection Time   07/30/11 10:22 AM      Component Value Range Status Comment   Specimen Description BLOOD LEFT ARM   Final    Special Requests BOTTLES DRAWN AEROBIC AND ANAEROBIC [facility]   Final    Culture  Setup Time [id number]   Final    Culture     Final    Value:        BLOOD CULTURE RECEIVED NO GROWTH TO DATE CULTURE WILL BE HELD FOR 5 DAYS BEFORE ISSUING A FINAL NEGATIVE REPORT   Report Status PENDING   Incomplete      Studies/Results: No results found.  Medications: Scheduled Meds:   . albuterol  2.5 mg Nebulization BID  . benzonatate  100 mg Oral TID  . docusate sodium  100 mg Oral BID  . gabapentin  100 mg Oral TID  . ipratropium  0.5 mg Nebulization BID  . levofloxacin (LEVAQUIN) IV  750 mg Intravenous Q48H  . levothyroxine  100 mcg Oral Q M,W,F  . levothyroxine  50 mcg Oral Q T,Th,S,Su  . lidocaine  4-5 mL Mouth/Throat Daily  . magnesium chloride  1 tablet Oral Daily  . megestrol  200 mg  Oral Daily  . oxyCODONE  20 mg Oral Q12H  . pantoprazole  40 mg Oral Daily  . potassium chloride  40 mEq Oral BID  . DISCONTD: piperacillin-tazobactam (ZOSYN)  IV  3.375 g Intravenous Q8H  . DISCONTD: vancomycin  750 mg Intravenous Q12H   Continuous Infusions:  PRN Meds:.acetaminophen, acetaminophen, albuterol, ALPRAZolam, alum & mag hydroxide-simeth, bisacodyl, emollient, guaiFENesin, HYDROcodone-acetaminophen, ipratropium, lidocaine-prilocaine, magic mouthwash, meclizine, morphine injection, ondansetron (ZOFRAN) IV, ondansetron, prochlorperazine, senna-docusate, sodium chloride, zolpidem  Assessment/Plan:  Principal Problem:  *PNA (pneumonia): Improving clinically, with  improvement in well being . Treated with zosyn, initially. Cough is less productive, and phlegm is clearer. No pyrexia overnight. Patient is status post bronchoscopy/BAL by Dr Marcelyn Bruins on 07/30/11 and was seen by Dr Staci Righter, ID, on 07/31/11. The were no organisms on gram stain or WBCs. No growth to date. Candida noted. Dr Luciana Axe has recommended monotherapy with Levaquin x 2 more days, only. For now, continue nebulizers prn and incentive spirometry. Active Problems:  GERD:  Asymptomatic on PPI.  Weakness generalized  Working with PT/PT. Multifactorial, secondary to acute problems and chronic underlying disease. Pancytopenia:  Secondary to MM and recent chemo/radiation therapy. This has actually remained stable during the hospitalization. Following CBC. SOB (shortness of breath) on exertion:  Improved. This is due to pneumonia and deconditioning. Hypokalemia: Repleting as indicated.    Comment: Will likely be stable for discharge in a couple of days.    LOS: 12 days   Drequan Ironside,CHRISTOPHER 07/31/2011, 3:54 PM

## 2011-08-01 DIAGNOSIS — R0602 Shortness of breath: Secondary | ICD-10-CM

## 2011-08-01 DIAGNOSIS — J8409 Other alveolar and parieto-alveolar conditions: Secondary | ICD-10-CM

## 2011-08-01 LAB — BASIC METABOLIC PANEL
BUN: 6 mg/dL (ref 6–23)
CO2: 24 mEq/L (ref 19–32)
Chloride: 108 mEq/L (ref 96–112)
Creatinine, Ser: 1.2 mg/dL — ABNORMAL HIGH (ref 0.50–1.10)

## 2011-08-01 LAB — CBC
HCT: 25 % — ABNORMAL LOW (ref 36.0–46.0)
Hemoglobin: 8.5 g/dL — ABNORMAL LOW (ref 12.0–15.0)
MCV: 89.9 fL (ref 78.0–100.0)
RBC: 2.78 MIL/uL — ABNORMAL LOW (ref 3.87–5.11)
WBC: 3.1 10*3/uL — ABNORMAL LOW (ref 4.0–10.5)

## 2011-08-01 LAB — CULTURE, RESPIRATORY W GRAM STAIN

## 2011-08-01 MED ORDER — ZOLPIDEM TARTRATE 5 MG PO TABS: 5.0000 mg | ORAL_TABLET | Freq: Every evening | ORAL | Status: DC | PRN

## 2011-08-01 MED ORDER — POTASSIUM CHLORIDE CRYS ER 20 MEQ PO TBCR
40.0000 meq | EXTENDED_RELEASE_TABLET | Freq: Once | ORAL | Status: DC
  Filled 2011-08-01: qty 2

## 2011-08-01 MED ORDER — HEPARIN SOD (PORK) LOCK FLUSH 100 UNIT/ML IV SOLN
500.0000 [IU] | INTRAVENOUS | Status: AC | PRN
  Administered 2011-08-01: 500 [IU]

## 2011-08-01 MED ORDER — ALPRAZOLAM 0.25 MG PO TABS: 0.2500 mg | ORAL_TABLET | Freq: Three times a day (TID) | ORAL | Status: AC | PRN

## 2011-08-01 NOTE — Progress Notes (Signed)
   CARE MANAGEMENT NOTE 08/01/2011  Patient:  SAMIYYAH, MOFFA   Account Number:  [id number]  Date Initiated:  07/22/2011  Documentation initiated by:  Lanier Clam  Subjective/Objective Assessment:   ADMITTED W/GENERALIZED Arnette Norris.HX: MULTIPLE MYELOMA.     Action/Plan:   FROM HOME W/SPOUSE.ONCOLOGY FOLLOWING.   Anticipated DC Date:  08/01/2011   Anticipated DC Plan:  HOME W HOME HEALTH SERVICES         Choice offered to / List presented to:  C-1 Patient   DME arranged  [facility]  3-N-1      DME agency  [facility]     HH arranged  HH-1 RN  HH-2 PT  HH-3 OT  HH-4 NURSE'S AIDE      HH agency  [facility]   Status of service:  Completed, signed off Medicare Important Message given?   (If response is "NO", the following Medicare IM given date fields will be blank) Date Medicare IM given:   Date Additional Medicare IM given:    Discharge Disposition:  HOME W HOME HEALTH SERVICES  Per UR Regulation:  Reviewed for med. necessity/level of care/duration of stay  Comments:  08/01/11 Tristian Bouska RN,BSN NCM [phone] SPOKE TO [facility] ABOUT D/C TODAY-HHRN/PT/OT/AIDE.[facility] HAS ALREADY DELIVERED 3N1,RW TO RM.  07/30/11 Alaisa Moffitt RN,BSN NCM [phone] S/P BRONCHOSCOPY.[facility] FOLLOWING FOR HH.  07/25/11 Delno Blaisdell RN,BSN NCM [phone] [facility] FOLLOWING FOR HH. WILL NEED ORDERS FOR RW,3N1 TO FAX TO [facility]#[phone] WILL DELIVER TO HOSPITAL.  07/22/11 Syndey Jaskolski RN,BSN NCM [phone]

## 2011-08-01 NOTE — Progress Notes (Signed)
Pt d/c instructions given, follow up appts and medications reviewed with pt and family.  No issues or concerns addressed at this time.  Pt d/ced home.

## 2011-08-01 NOTE — Progress Notes (Signed)
Subjective: No issues overnight.  She feels that she is much improved.  Minimal cough, no sputum Objective: Vital signs in last 24 hours: Blood pressure 134/78, pulse 111, temperature 98.8 F (37.1 C), temperature source Axillary, resp. rate 18, height 5\' 5"  (1.651 m), weight 74.4 kg (164 lb 0.4 oz), SpO2 96.00%.  Intake/Output from previous day: 01/30 0701 - 01/31 0700 In: 75 [P.O.:75] Out: -    Physical Exam:   ow female in nad Chest with right upper lung zone crackles, no wheezing Cor with rrr abd benign  Alert and oriented, moves all 4.    Lab Results:  Basename 08/01/11 0450 07/31/11 0520 07/30/11 0445  WBC 3.1* 3.7* 3.5*  HGB 8.5* 7.8* 8.8*  HCT 25.0* 22.8* 25.8*  PLT 122* 118* 147*   BMET  Basename 08/01/11 0450 07/31/11 0520 07/30/11 0445  NA 141 140 141  K 3.2* 3.2* 2.9*  CL 108 107 110  CO2 24 24 22   GLUCOSE 86 87 101*  BUN 6 6 6   CREATININE 1.20* 1.38* 1.40*  CALCIUM 9.2 8.9 9.6    Studies/Results: No results found.  Assessment/Plan: Patient Active Hospital Problem List:  Pulmonary infiltrates in a pt with myeloma and h/o recent pancytopenia post chemotherapy.  Clinically, she is better from admit, and no fever the last 24 hrs. Her bronch was really unrevealing, and her lavage showed no significant inflammatory component.  She appears to be resolving whatever this was.  I still think this may have been some type of inflammatory process, and maybe not related to infection at all?  Now that we have excluded an opportunistic infection, would consider a short course of prednisone if she was to relapse or not return to baseline.  Will sign off.  Please have her f/u with oncology, and f/u with me only if she has worsening of her symptoms.    Barbaraann Share, M.D. 08/01/2011, 8:57 AM

## 2011-08-01 NOTE — Discharge Summary (Signed)
Physician Discharge Summary  Patient ID: [name] MRN: [id number] DOB/AGE: 1951-03-15 61 y.o.  Admit date: 07/19/2011 Discharge date: 08/01/2011  Primary Care Physician:  [name], [place], [place]   Discharge Diagnoses:    Patient Active Problem List  Diagnoses  . MULTIPLE  MYELOMA  . HYPOTHYROIDISM  . GERD  . HEMORRHAGE OF RECTUM AND ANUS  . SCIATICA  . FATIGUE  . SHORTNESS OF BREATH  . CHEST PAIN  . CHEST DISCOMFORT  . HYPERTENSION, HX OF  . Weakness generalized  . Dizziness  . PNA (pneumonia)  . Dehydration  . Pancytopenia  . SOB (shortness of breath) on exertion  . Hypokalemia  . Fever    Medication List  As of 08/01/2011 12:50 PM   STOP taking these medications         eszopiclone 1 MG Tabs         TAKE these medications         ALPRAZolam 0.25 MG tablet   Commonly known as: XANAX   Take 1 tablet (0.25 mg total) by mouth 3 (three) times daily as needed for anxiety.      calcium carbonate 1250 MG tablet   Commonly known as: OS-CAL - dosed in mg of elemental calcium   Take 1 tablet by mouth daily.      cholecalciferol 1000 UNITS tablet   Commonly known as: VITAMIN D   Take 1,000 Units by mouth daily.      dexamethasone 4 MG tablet   Commonly known as: DECADRON   Take 4 mg by mouth every Monday.      emollient cream   Commonly known as: BIAFINE   Apply topically as needed.      gabapentin 100 MG capsule   Commonly known as: NEURONTIN   Take 100 mg by mouth 3 (three) times daily.      HYDROcodone-acetaminophen 7.5-750 MG per tablet   Commonly known as: VICODIN ES   Take 1 tablet by mouth every 6 (six) hours as needed.      levothyroxine 100 MCG tablet   Commonly known as: SYNTHROID, LEVOTHROID   Take 50-100 mcg by mouth as directed. 1 tab x 3 days a week Monday,wednesday,friday 1/2 tab all others day      lidocaine 2 % solution   Commonly known as: XYLOCAINE   Take 4-5 mLs by mouth daily.      lidocaine-prilocaine cream   Commonly  known as: EMLA   Apply topically as needed.      magic mouthwash Soln   Take 5 mLs by mouth as needed.      magnesium chloride 64 MG Tbec   Commonly known as: SLOW-MAG   Take 1 tablet by mouth daily.      mulitivitamin with minerals Tabs   Take 1 tablet by mouth daily.      oxyCODONE 20 MG 12 hr tablet   Commonly known as: OXYCONTIN   Take 20 mg by mouth every 12 (twelve) hours. Pt now primarily taking 1 tab at night      pantoprazole 40 MG tablet   Commonly known as: PROTONIX   TAKE 1 TABLET BY MOUTH EVERY DAY      PRESCRIPTION MEDICATION   [facility]/Dr. Mohamed: Meloma Salvage DVD IV q21d Cycle 3 12/10-12/21. Cycle 4 scheduled for 12/31.  Chemo treatments every Monday and Thursday      prochlorperazine 10 MG tablet   Commonly known as: COMPAZINE   Take 10 mg by mouth every 6 (  six) hours as needed. nAUSEA      zolpidem 5 MG tablet   Commonly known as: AMBIEN   Take 1 tablet (5 mg total) by mouth at bedtime as needed for sleep.             Disposition and Follow-up:  Follow up with primary MD and primary oncologist.  Consults:  pulmonary/intensive care and ID  Dr. Marcelyn Bruins, pulmonologist. Dr. Staci Righter, infectious diseases specialist.  Significant Diagnostic Studies:  Dg Chest 2 View  07/19/2011  *RADIOLOGY REPORT*  Clinical Data: Shortness of breath.  Weakness.  Cough.  Myeloma.  CHEST - 2 VIEW  Comparison: 06/26/2011  Findings: Right power port tip projects over the SVC.  Cardiac and mediastinal contours appear unremarkable.  Mild prominence of the pulmonary vasculature noted.  There is indistinct opacity in the right paramediastinal region which appears new compared to 03/23/2011, suspicious for pneumonia or possibly atelectasis.  IMPRESSION:  1.  Indistinct right paramediastinal density, query pneumonia or atelectasis. 2.  Borderline prominence of the pulmonary vasculature.  Original Report Authenticated By: Dellia Cloud, M.D.   Ct Head Wo  Contrast  07/19/2011  *RADIOLOGY REPORT*  Clinical Data: Dizziness  CT HEAD WITHOUT CONTRAST  Technique:  Contiguous axial images were obtained from the base of the skull through the vertex without contrast.  Comparison: CT brain scan of 04/11/2009  Findings: The ventricular system is stable in size and configuration, and the septum is in a normal midline position.  The fourth ventricle and basilar cisterns appear normal.  No hemorrhage, mass lesion, or acute infarction is seen.  On bone window images, however, there are now calvarial lucencies that are new compared to the prior study.  Is there clinical suspicion of multiple myeloma or metastatic disease?  IMPRESSION:  1.  Negative unenhanced CT of the brain. 2.  However there are new lucencies within the calvarium suspicious for either multiple myeloma or metastatic involvement of the calvarium.  Correlate clinically.  Original Report Authenticated By: Juline Patch, M.D.   Ct Angio Chest W/cm &/or Wo Cm  07/19/2011  *RADIOLOGY REPORT*  Clinical Data: Shortness of breath, elevated D-dimer.  History of multiple myeloma  CT ANGIOGRAPHY CHEST  Technique:  Multidetector CT imaging of the chest using the standard protocol during bolus administration of intravenous contrast. Multiplanar reconstructed images including MIPs were obtained and reviewed to evaluate the vascular anatomy.  Contrast: 60mL OMNIPAQUE IOHEXOL 300 MG/ML IV SOLN  Comparison: Chest radiograph same date, most recent chest CT 12/22/2009  Findings: Right-sided Port-A-Cath is in place with the tip terminating in the low right atrium.  Heart size is normal.  No lymphadenopathy.  Great vessels are normal in caliber, with incidental note made of a bovine arch configuration, a normal variant.  There is a central bronchial wall thickening as well as right greater than left upper lobe alveolar airspace consolidation, likely accounting for the history of the paratracheal density seen on prior chest  radiography.  There is also minimal superior segment right lower lobe airspace consolidation.  Trace right greater than left pleural effusions are noted.  No pneumothorax.  The study is of adequate technical quality for evaluation for pulmonary embolism up to and including the 3rd order pulmonary arteries. No focal filling defect is seen to suggest acute pulmonary embolism.  No pericardial effusion.  No axillary lymphadenopathy.  No new osseous abnormality.  Subtle areas of trabecular rarefaction in the spine could reflect osteoporosis but could also indicate myelomatous involvement.  There are also subtle lytic lesions in the sternum.  Compared to the prior exam, these are new/increased.  IMPRESSION: Multilobar airspace consolidation and trace pleural effusions as above, most compatible with pneumonia.  Myelomatous involvement would be less likely, as would treatment effect. No CT evidence for acute pulmonary embolism.  New/increasing lytic osseous lesions, also compatible with the given history of multiple myeloma.  Original Report Authenticated By: Harrel Lemon, M.D.   Brief H and P: For complete details, refer to admission H and P. However, in brief, this is a 61 year old female, with history multiple myeloma initially diagnosed in October 2008 is status post palliative radiotherapy and on systemic chemotherapy last treatment approximately a week prior to admission, history of hypothyroidism, hyperlipidemia, GERD who presents to the emergency room with a one to two-day history of worsening fatigue, weakness, shortness of breath on exertion, upper respiratory symptoms, non-productive cough, inability to stand due to generalized weakness and postural dizziness.  She presented to her PCPs office, was noted to have borderline hypotension with a blood pressure 100/52 and a heart rate of 104, sent to the ED, where chest x-ray raised concern for right paramediastinal density query pneumonia. Patient was  admitted for further evaluation, investigation and management.  Physical Exam: On 08/01/11. General: Comfortable, alert, communicative, fully oriented, not short of breath at rest.  HEENT: Mild clinical pallor, no jaundice, no conjunctival injection or discharge.  NECK: Supple, JVP not seen, no carotid bruits, no palpable lymphadenopathy, no palpable goiter.  CHEST: Clinically clear to auscultation, no wheezes, no crackles.  HEART: Sounds 1 and 2 heard, normal, regular, no murmurs.  ABDOMEN: Full, soft, non-tender, no palpable organomegaly, no palpable masses, normal bowel sounds.  GENITALIA: Not examined.  LOWER EXTREMITIES: No pitting edema, palpable peripheral pulses.  MUSCULOSKELETAL SYSTEM: Unremarkable.  CENTRAL NERVOUS SYSTEM: No focal neurologic deficit on gross examination.   Hospital Course:  Principal Problem:  *PNA (pneumonia): Patient was treated with Vancomycin and Zosyn, initially. Clinical improvement was slow, and course was punctuated by recurrent pyrexia. Over the course of hospitalization however, patient improved. Pulmonary consultation was provided by Dr Marcelyn Bruins, who performed bronchoscopy/BAL on 07/30/11, which was unrevealing, and her lavage showed no significant inflammatory component. The were no organisms on gram stain or WBCs. No growth to date. Dr Molly Maduro comer,provided  ID consultation, and on 07/31/11, recommended monotherapy with Levaquin x 2 more days, which were completed on 08/01/11. Patient has remained apyrexial in the last 48 hours. Active Problems:  1. GERD:  This remained asymptomatic on PPI.  2. Weakness generalized: Multifactorial, secondary to acute problems and chronic underlying disease. Patient was evaluated by PT/OT, and HHPT/OT, as well as needed equipment, recommended. 3. Pancytopenia: This is secondary to myeloma and recent chemo/radiation therapy, and remained stable during the hospitalization.  4. SOB (shortness of breath) on exertion:  This  has significantly improved, during patient's hospitalization, and is due to pneumonia and deconditioning.  5. Hypokalemia: Repleted as indicated.   Comment: Clinically stable for discharge on 08/01/11.   Time spent on Discharge: 45 mins.  Signed: Taleeyah Bora,CHRISTOPHER 08/01/2011, 12:50 PM

## 2011-08-05 ENCOUNTER — Ambulatory Visit (HOSPITAL_BASED_OUTPATIENT_CLINIC_OR_DEPARTMENT_OTHER): Payer: Medicare HMO | Admitting: Internal Medicine

## 2011-08-05 ENCOUNTER — Telehealth: Payer: Self-pay | Admitting: Internal Medicine

## 2011-08-05 ENCOUNTER — Other Ambulatory Visit: Payer: Medicare HMO | Admitting: Lab

## 2011-08-05 VITALS — BP 116/72 | HR 94 | Temp 98.0°F | Ht 65.0 in | Wt 160.4 lb

## 2011-08-05 DIAGNOSIS — C9001 Multiple myeloma in remission: Secondary | ICD-10-CM

## 2011-08-05 DIAGNOSIS — C9000 Multiple myeloma not having achieved remission: Secondary | ICD-10-CM

## 2011-08-05 LAB — CBC WITH DIFFERENTIAL/PLATELET
BASO%: 0.3 % (ref 0.0–2.0)
EOS%: 0.9 % (ref 0.0–7.0)
HGB: 10 g/dL — ABNORMAL LOW (ref 11.6–15.9)
MCH: 31.1 pg (ref 25.1–34.0)
MCHC: 33.8 g/dL (ref 31.5–36.0)
MONO#: 0.6 10*3/uL (ref 0.1–0.9)
RDW: 15.3 % — ABNORMAL HIGH (ref 11.2–14.5)
WBC: 4.9 10*3/uL (ref 3.9–10.3)
lymph#: 0.2 10*3/uL — ABNORMAL LOW (ref 0.9–3.3)

## 2011-08-05 LAB — CULTURE, BLOOD (ROUTINE X 2): Culture  Setup Time: 201301291445

## 2011-08-05 LAB — COMPREHENSIVE METABOLIC PANEL
ALT: 24 U/L (ref 0–35)
AST: 41 U/L — ABNORMAL HIGH (ref 0–37)
Albumin: 3.7 g/dL (ref 3.5–5.2)
CO2: 21 mEq/L (ref 19–32)
Calcium: 9.1 mg/dL (ref 8.4–10.5)
Chloride: 106 mEq/L (ref 96–112)
Potassium: 3.3 mEq/L — ABNORMAL LOW (ref 3.5–5.3)
Total Protein: 6.1 g/dL (ref 6.0–8.3)

## 2011-08-05 NOTE — Progress Notes (Signed)
[facility] OFFICE PROGRESS NOTE  [name],[name], PA, PA No address on file  DIAGNOSIS: Recurrent multiple myeloma initially diagnosed in October 2008.  PRIOR THERAPY:  1. Status post palliative radiotherapy to the lumbar spine between L3 and L5. The patient received a total dose of 3000 cGy in 10 fractions under the care of Dr. Mitzi Hansen between May 05, 2007 through May 18, 2007. 2. Status post 5 cycles of systemic chemotherapy with Revlimid and low-dose Decadron with good response to this treatment. 3. Status post autologous peripheral blood stem cell transplant at [facility] on Nov 20, 2007 under the care of Dr. Vicente Serene. 4. Status post treatment for disease recurrence with Velcade, Doxil and Decadron. Last dose given Nov 09, 2009. Discontinued secondary to intolerance but the patient had a good response to treatment at that time. 5. Status post palliative radiotherapy to the T2-T6 thoracic vertebrae completed 03/21/2011 under the care of Dr. Mitzi Hansen. CURRENT THERAPY: : Systemic chemotherapy with Velcade 1.3 mg per meter squared given on days 1, 4, 8 and 11, and Doxil at 30 mg per meter squared given on day 4 in addition to Decadron status post 4 cycles.     INTERVAL HISTORY: [name] 61 y.o. female returns to the clinic today for followup visit accompanied by her son and daughter. The patient was recently hospitalized to [facility] for pneumonia and pancytopenia. She received 2 units of packed rbc's transfusion during her hospitalization. The patient was also treated with a course of antibiotics with vancomycin and Zosyn. The patient was discharged home on 08/01/2011. She is feeling fine today and afebrile. No significant chest pain or shortness of breath, no cough or hemoptysis. She requested a break from chemotherapy after cycle #4  MEDICAL HISTORY: Past Medical History  Diagnosis Date  . Cancer multiple myeloma  . Thyroid disease  Hypothyroidism  . Hypercholesterolemia   . Compression fracture 04/08/2007    pathologic compression fracture  . Hypothyroidism   . FHx: chemotherapy     s/p 5 cycle revlimid/low dose decadron,s/p velcade,doxil,decadron,  . Hx of radiation therapy 05/05/07-05/18/07,& 03/05/11-03/21/11-    l3&l5 in 2008, t2-t6 03/2011  . GERD (gastroesophageal reflux disease)   . Insomnia     associated with steroids  . Nausea   . Constipation     takes oxycontin,vicodin    ALLERGIES:   has no known allergies.  MEDICATIONS:  Current Outpatient Prescriptions  Medication Sig Dispense Refill  . ALPRAZolam (XANAX) 0.25 MG tablet Take 1 tablet (0.25 mg total) by mouth 3 (three) times daily as needed for anxiety.  90 tablet  0  . Alum & Mag Hydroxide-Simeth (MAGIC MOUTHWASH) SOLN Take 5 mLs by mouth as needed.        . calcium carbonate (OS-CAL - DOSED IN MG OF ELEMENTAL CALCIUM) 1250 MG tablet Take 1 tablet by mouth daily.      . cholecalciferol (VITAMIN D) 1000 UNITS tablet Take 1,000 Units by mouth daily.       Marland Kitchen dexamethasone (DECADRON) 4 MG tablet Take 4 mg by mouth every Monday.       . emollient (BIAFINE) cream Apply topically as needed.        . gabapentin (NEURONTIN) 100 MG capsule Take 100 mg by mouth 3 (three) times daily.       Marland Kitchen HYDROcodone-acetaminophen (VICODIN ES) 7.5-750 MG per tablet Take 1 tablet by mouth every 6 (six) hours as needed.  60 tablet  0  . levothyroxine (SYNTHROID, LEVOTHROID) 100 MCG tablet Take 50-100 mcg by mouth as directed. 1 tab x 3 days a week Monday,wednesday,friday 1/2 tab all others day      . lidocaine-prilocaine (EMLA) cream Apply topically as needed.        . magnesium chloride (SLOW-MAG) 64 MG TBEC Take 1 tablet by mouth daily.        . Multiple Vitamin (MULITIVITAMIN WITH MINERALS) TABS Take 1 tablet by mouth daily.      Marland Kitchen oxyCODONE (OXYCONTIN) 20 MG 12 hr tablet Take 20 mg by mouth every 12 (twelve) hours. Pt now primarily taking 1 tab at night      .  pantoprazole (PROTONIX) 40 MG tablet TAKE 1 TABLET BY MOUTH EVERY DAY  30 tablet  1  . prochlorperazine (COMPAZINE) 10 MG tablet Take 10 mg by mouth every 6 (six) hours as needed. nAUSEA      . zolpidem (AMBIEN) 5 MG tablet Take 1 tablet (5 mg total) by mouth at bedtime as needed for sleep.  30 tablet  0  . lidocaine (XYLOCAINE) 2 % solution Take 4-5 mLs by mouth daily.       Marland Kitchen PRESCRIPTION MEDICATION [facility]/Dr. Kym Fenter: Meloma Salvage DVD IV q21d Cycle 3 12/10-12/21. Cycle 4 scheduled for 12/31. Chemo treatments every Monday and Thursday       No current facility-administered medications for this visit.   Facility-Administered Medications Ordered in Other Visits  Medication Dose Route Frequency Provider Last Rate Last Dose  . 0.9 %  sodium chloride infusion   Intravenous Once Lisandro Meggett K. Pharrell Ledford, MD      . ondansetron (ZOFRAN) IVPB 8 mg  8 mg Intravenous Once Victorious Kundinger K. Kairee Kozma, MD      . sodium chloride 0.9 % injection 10 mL  10 mL Intracatheter PRN Marcy Bogosian K. Arbutus Ped, MD        SURGICAL HISTORY:  Past Surgical History  Procedure Date  . Cholecystectomy   . Knee surgery   . Knee surgery   . Video bronchoscopy 07/30/2011    Procedure: VIDEO BRONCHOSCOPY WITHOUT FLUORO;  Surgeon: Barbaraann Share, MD;  Location: [facility];  Service: Cardiopulmonary;  Laterality: Bilateral;    REVIEW OF SYSTEMS:  A comprehensive review of systems was negative except for: Constitutional: positive for fatigue Respiratory: positive for dyspnea on exertion   PHYSICAL EXAMINATION: General appearance: alert, cooperative and no distress Neck: no adenopathy Lymph nodes: Cervical, supraclavicular, and axillary nodes normal. Resp: clear to auscultation bilaterally Cardio: regular rate and rhythm, S1, S2 normal, no murmur, click, rub or gallop GI: soft, non-tender; bowel sounds normal; no masses,  no organomegaly Extremities: extremities normal, atraumatic, no cyanosis or edema  ECOG PERFORMANCE STATUS: 1 -  Symptomatic but completely ambulatory  Blood pressure 116/72, pulse 94, temperature 98 F (36.7 C), temperature source Oral, height 5\' 5"  (1.651 m), weight 160 lb 6.4 oz (72.757 kg).  LABORATORY DATA: Lab Results  Component Value Date   WBC 4.9 08/05/2011   HGB 10.0* 08/05/2011   HCT 29.7* 08/05/2011   MCV 92.2 08/05/2011   PLT 159 08/05/2011      Chemistry      Component Value Date/Time   NA 141 08/01/2011 0450   K 3.2* 08/01/2011 0450   CL 108 08/01/2011 0450   CO2 24 08/01/2011 0450   BUN 6 08/01/2011 0450   CREATININE 1.20* 08/01/2011 0450      Component Value Date/Time   CALCIUM 9.2 08/01/2011 0450   ALKPHOS  156* 07/20/2011 0900   AST 57* 07/20/2011 0900   ALT 33 07/20/2011 0900   BILITOT 0.5 07/20/2011 0900       RADIOGRAPHIC STUDIES: Dg Chest 2 View  07/29/2011  *RADIOLOGY REPORT*  Clinical Data: Short of breath  CHEST - 2 VIEW  Comparison: 07/26/2011  Findings: Stable right internal jugular vein Port-A-Cath.  Normal heart size.  Bilateral central and upper lobe heterogeneous opacities are improved.  No pneumothorax.  No pleural fluid. Bilateral effusions resolved.  IMPRESSION: Improved bilateral predominately upper lobe airspace disease. Resolved effusions.  Original Report Authenticated By: Donavan Burnet, M.D.   Dg Chest 2 View  07/26/2011  *RADIOLOGY REPORT*  Clinical Data: SOB  CHEST - 2 VIEW  Comparison: 07/19/2011  Findings:  There is a right chest wall porta-catheter with tip in the cavoatrial junction.  Heart size appears normal.  There are small bilateral pleural effusions and mild edema.  Interval increase in the bilateral upper lobe airspace opacities, right greater than left.  IMPRESSION:  1.  Worsening aeration to both upper lobes. 2.  Bilateral effusions and mild edema.  New from previous exam.  Original Report Authenticated By: Rosealee Albee, M.D.   Dg Chest 2 View  07/19/2011  *RADIOLOGY REPORT*  Clinical Data: Shortness of breath.  Weakness.  Cough.  Myeloma.  CHEST  - 2 VIEW  Comparison: 06/26/2011  Findings: Right power port tip projects over the SVC.  Cardiac and mediastinal contours appear unremarkable.  Mild prominence of the pulmonary vasculature noted.  There is indistinct opacity in the right paramediastinal region which appears new compared to 03/23/2011, suspicious for pneumonia or possibly atelectasis.  IMPRESSION:  1.  Indistinct right paramediastinal density, query pneumonia or atelectasis. 2.  Borderline prominence of the pulmonary vasculature.  Original Report Authenticated By: Dellia Cloud, M.D.   Ct Head Wo Contrast  07/19/2011  *RADIOLOGY REPORT*  Clinical Data: Dizziness  CT HEAD WITHOUT CONTRAST  Technique:  Contiguous axial images were obtained from the base of the skull through the vertex without contrast.  Comparison: CT brain scan of 04/11/2009  Findings: The ventricular system is stable in size and configuration, and the septum is in a normal midline position.  The fourth ventricle and basilar cisterns appear normal.  No hemorrhage, mass lesion, or acute infarction is seen.  On bone window images, however, there are now calvarial lucencies that are new compared to the prior study.  Is there clinical suspicion of multiple myeloma or metastatic disease?  IMPRESSION:  1.  Negative unenhanced CT of the brain. 2.  However there are new lucencies within the calvarium suspicious for either multiple myeloma or metastatic involvement of the calvarium.  Correlate clinically.  Original Report Authenticated By: Juline Patch, M.D.   Ct Angio Chest W/cm &/or Wo Cm  07/19/2011  *RADIOLOGY REPORT*  Clinical Data: Shortness of breath, elevated D-dimer.  History of multiple myeloma  CT ANGIOGRAPHY CHEST  Technique:  Multidetector CT imaging of the chest using the standard protocol during bolus administration of intravenous contrast. Multiplanar reconstructed images including MIPs were obtained and reviewed to evaluate the vascular anatomy.  Contrast: 60mL  OMNIPAQUE IOHEXOL 300 MG/ML IV SOLN  Comparison: Chest radiograph same date, most recent chest CT 12/22/2009  Findings: Right-sided Port-A-Cath is in place with the tip terminating in the low right atrium.  Heart size is normal.  No lymphadenopathy.  Great vessels are normal in caliber, with incidental note made of a bovine arch configuration, a normal variant.  There is a central bronchial wall thickening as well as right greater than left upper lobe alveolar airspace consolidation, likely accounting for the history of the paratracheal density seen on prior chest radiography.  There is also minimal superior segment right lower lobe airspace consolidation.  Trace right greater than left pleural effusions are noted.  No pneumothorax.  The study is of adequate technical quality for evaluation for pulmonary embolism up to and including the 3rd order pulmonary arteries. No focal filling defect is seen to suggest acute pulmonary embolism.  No pericardial effusion.  No axillary lymphadenopathy.  No new osseous abnormality.  Subtle areas of trabecular rarefaction in the spine could reflect osteoporosis but could also indicate myelomatous involvement.  There are also subtle lytic lesions in the sternum.  Compared to the prior exam, these are new/increased.  IMPRESSION: Multilobar airspace consolidation and trace pleural effusions as above, most compatible with pneumonia.  Myelomatous involvement would be less likely, as would treatment effect. No CT evidence for acute pulmonary embolism.  New/increasing lytic osseous lesions, also compatible with the given history of multiple myeloma.  Original Report Authenticated By: Harrel Lemon, M.D.    ASSESSMENT: This is a very pleasant 61 years old white female with history of multiple myeloma most recently treated with 4 cycles of chemotherapy with Velcade, Doxil and Decadron. She tolerated her treatment fairly well. The patient recently diagnosed with pneumonia and was  hospitalized for almost 2 week. She is feeling better now.   PLAN: I recommended for observation for now with repeat CBC, comprehensive metabolic panel, LDH and myeloma panel in 3 months in addition to a skeletal bone survey. The patient come back for followup visit at that time. She was advised to call me immediately she has any concerning symptoms in the interval.   All questions were answered. The patient knows to call the clinic with any problems, questions or concerns. We can certainly see the patient much sooner if necessary.

## 2011-08-05 NOTE — Telephone Encounter (Signed)
gv pt appt schedule for may.  °

## 2011-08-07 ENCOUNTER — Ambulatory Visit (HOSPITAL_COMMUNITY)
Admission: RE | Admit: 2011-08-07 | Discharge: 2011-08-07 | Disposition: A | Discharge status: Home / Self Care | Payer: Medicare HMO | Source: Ambulatory Visit | Attending: Internal Medicine | Admitting: Internal Medicine

## 2011-08-07 ENCOUNTER — Other Ambulatory Visit (HOSPITAL_COMMUNITY): Payer: Self-pay | Admitting: Internal Medicine

## 2011-08-07 DIAGNOSIS — R5381 Other malaise: Secondary | ICD-10-CM | POA: Insufficient documentation

## 2011-08-07 DIAGNOSIS — R0602 Shortness of breath: Secondary | ICD-10-CM | POA: Insufficient documentation

## 2011-08-07 DIAGNOSIS — R5383 Other fatigue: Secondary | ICD-10-CM | POA: Insufficient documentation

## 2011-08-07 DIAGNOSIS — R509 Fever, unspecified: Secondary | ICD-10-CM | POA: Insufficient documentation

## 2011-08-14 ENCOUNTER — Ambulatory Visit (HOSPITAL_COMMUNITY)
Admission: RE | Admit: 2011-08-14 | Discharge: 2011-08-14 | Disposition: A | Discharge status: Home / Self Care | Payer: Medicare HMO | Source: Ambulatory Visit | Attending: Internal Medicine | Admitting: Internal Medicine

## 2011-08-14 ENCOUNTER — Other Ambulatory Visit (HOSPITAL_COMMUNITY): Payer: Self-pay | Admitting: Internal Medicine

## 2011-08-14 DIAGNOSIS — J159 Unspecified bacterial pneumonia: Secondary | ICD-10-CM

## 2011-08-23 NOTE — Telephone Encounter (Signed)
xxx

## 2011-08-28 ENCOUNTER — Encounter: Payer: Self-pay | Admitting: *Deleted

## 2011-08-28 DIAGNOSIS — Z9484 Stem cells transplant status: Secondary | ICD-10-CM | POA: Insufficient documentation

## 2011-08-29 LAB — FUNGUS CULTURE W SMEAR: Fungal Smear: NONE SEEN

## 2011-08-30 ENCOUNTER — Ambulatory Visit
Admission: RE | Admit: 2011-08-30 | Discharge: 2011-08-30 | Disposition: A | Discharge status: Home / Self Care | Payer: Medicare HMO | Source: Ambulatory Visit | Attending: Radiation Oncology | Admitting: Radiation Oncology

## 2011-08-30 ENCOUNTER — Encounter: Payer: Self-pay | Admitting: Radiation Oncology

## 2011-08-30 DIAGNOSIS — C9000 Multiple myeloma not having achieved remission: Secondary | ICD-10-CM

## 2011-08-30 NOTE — Progress Notes (Signed)
Pt reports "pain, tingling in feet from chemo", takes Gabapentin tid w/fair relief. Pt on long-acting, short-acting meds for pain. She states she occass has pain in her mid back area, repositioning is helpful. Appetite, energy level improving.

## 2011-09-03 NOTE — Progress Notes (Signed)
DIAGNOSIS:  Multiple myeloma.  INTERVAL HISTORY:  [name] returns to clinic today for followup. She previously has received 2 courses of palliative radiation in our office corresponding most recently to the T2-T6 levels, and she also received prior treatment to the lumbar spine to the L3-L5 levels in 2008.  My last visit with [name] was several months ago.  She has been complaining of some right-sided chest pain, and we decided to proceed with a bone scan for further evaluation.  The plan was to have the patient return to clinic to discuss this, but somehow this did not happen, potentially an issue with Epic.  Nevertheless she is aware that there were no major findings with this and I have had a chance to review this once again, and I do not see any appropriate target lesions for radiation.  This pain has not worsened.  She is on observation right now with Dr. Arbutus Ped.  She states that her appetite and energy level are both fairly good at this time.  PHYSICAL EXAMINATION:  Weight 157 pounds, blood pressure 117/75, pulse 78, respiratory rate 20, temperature 98.2.  General:  Well-developed female in no acute distress, alert and oriented x3.  HEENT: Normocephalic, atraumatic.  Neck:  Supple without any lymphadenopathy. Cardiovascular:  Regular rate and rhythm.  Respiratory:  Clear to auscultation.  IMPRESSION/PLAN:  [name] is doing satisfactorily at this time.  I should note that she did have pneumonia fairly recently, but has been feeling better.  Overall she feels quite good, and as discussed above I do not see any targets for radiation which would be appropriate currently.  She is continuing management with Dr. Arbutus Ped and right now is on observation.  I am going to have her return to our clinic on a p.r.n. basis.  I spent 10 minutes with [name] today, the majority of which was spent counseling her on her diagnosis of myeloma and coordinating her  care.    ______________________________ Radene Gunning, M.D., Ph.D. JSM/MEDQ  D:  09/03/2011  T:  09/03/2011  Job:  [id number]

## 2011-09-06 ENCOUNTER — Encounter (HOSPITAL_COMMUNITY): Payer: Self-pay | Admitting: Emergency Medicine

## 2011-09-06 ENCOUNTER — Emergency Department (HOSPITAL_COMMUNITY)
Admission: EM | Admit: 2011-09-06 | Discharge: 2011-09-07 | Disposition: A | Discharge status: Home / Self Care | Payer: Medicare HMO | Attending: Emergency Medicine | Admitting: Emergency Medicine

## 2011-09-06 DIAGNOSIS — R05 Cough: Secondary | ICD-10-CM | POA: Insufficient documentation

## 2011-09-06 DIAGNOSIS — Z87898 Personal history of other specified conditions: Secondary | ICD-10-CM | POA: Insufficient documentation

## 2011-09-06 DIAGNOSIS — J40 Bronchitis, not specified as acute or chronic: Secondary | ICD-10-CM

## 2011-09-06 DIAGNOSIS — E7800 Pure hypercholesterolemia, unspecified: Secondary | ICD-10-CM | POA: Insufficient documentation

## 2011-09-06 DIAGNOSIS — J8410 Pulmonary fibrosis, unspecified: Secondary | ICD-10-CM | POA: Insufficient documentation

## 2011-09-06 DIAGNOSIS — R0602 Shortness of breath: Secondary | ICD-10-CM | POA: Insufficient documentation

## 2011-09-06 DIAGNOSIS — E039 Hypothyroidism, unspecified: Secondary | ICD-10-CM | POA: Insufficient documentation

## 2011-09-06 DIAGNOSIS — R059 Cough, unspecified: Secondary | ICD-10-CM | POA: Insufficient documentation

## 2011-09-06 NOTE — ED Notes (Signed)
Pt states she has a cough that started yesterday evening  Cough sounds congested  Pt states it has gotten worse  Pt states sometimes she coughs up whitish to yellowish colored sputum  Pt states she has had some wheezing and shortness of breath with the cough  Pt states she had pneumonia about 3 weeks ago

## 2011-09-07 ENCOUNTER — Emergency Department (HOSPITAL_COMMUNITY): Payer: Medicare HMO

## 2011-09-07 MED ORDER — ALBUTEROL SULFATE HFA 108 (90 BASE) MCG/ACT IN AERS
2.0000 | INHALATION_SPRAY | RESPIRATORY_TRACT | Status: DC | PRN
  Administered 2011-09-07: 2 via RESPIRATORY_TRACT
  Filled 2011-09-07: qty 6.7

## 2011-09-07 MED ORDER — CEFTRIAXONE SODIUM 1 G IJ SOLR
1.0000 g | Freq: Once | INTRAMUSCULAR | Status: AC
  Administered 2011-09-07: 1 g via INTRAMUSCULAR
  Filled 2011-09-07: qty 10

## 2011-09-07 MED ORDER — AZITHROMYCIN 250 MG PO TABS: ORAL_TABLET | ORAL | Status: DC

## 2011-09-07 NOTE — ED Provider Notes (Signed)
History     CSN: [id number]  Arrival date & time 09/06/11  2218   First MD Initiated Contact with Patient 09/07/11 0045      Chief Complaint  Patient presents with  . Cough    (Consider location/radiation/quality/duration/timing/severity/associated sxs/prior treatment) HPI This is a 61 year old black female with a history of multiple myeloma and a recent history of pneumonia. She is here with a two-day history of cough, productive of white sputum, fever to 101 and malaise. She has had no nausea, vomiting or diarrhea but the cough has been severe enough to make her gag at times. She states she has had dyspnea and wheezing but these have not been severe. The cough has been moderate to severe. She is not currently on chemotherapy for her multiple myeloma.  Past Medical History  Diagnosis Date  . Thyroid disease Hypothyroidism  . Hypercholesterolemia   . Compression fracture 04/08/2007    pathologic compression fracture  . Hypothyroidism   . FHx: chemotherapy     s/p 5 cycle revlimid/low dose decadron,s/p velcade,doxil,decadron,  . Hx of radiation therapy 05/05/07-05/18/07,& 03/05/11-03/21/11-    l3&l5 in 2008, t2-t6 03/2011  . GERD (gastroesophageal reflux disease)   . Insomnia     associated with steroids  . Nausea   . Constipation     takes oxycontin,vicodin  . Hx of radiation therapy 05/05/2007 to 05/18/2007    palliative, L3-5  . 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 Hx of radiation therapy 03/05/2011 to 03/21/2011    palliative T2-T6, c-spine  . History of autologous stem cell transplant 11/20/2007    [facility], Dr Vicente Serene    Past Surgical History  Procedure Date  . Cholecystectomy   . Knee surgery   . Knee surgery   . Video bronchoscopy 07/30/2011    Procedure: VIDEO BRONCHOSCOPY WITHOUT FLUORO;  Surgeon: Barbaraann Share, MD;  Location: [facility];  Service: Cardiopulmonary;  Laterality: Bilateral;    Family History  Problem Relation Age of Onset  . Cancer Brother     lung  ca  . Cancer Maternal Uncle     colon  . Cancer Maternal Grandmother     breast ca    History  Substance Use Topics  . Smoking status: Former Smoker -- 0.2 packs/day for 6 years    Quit date: 08/27/1978  . Smokeless tobacco: Not on file  . Alcohol Use: No    OB History    Grav Para Term Preterm Abortions TAB SAB Ect Mult Living                  Review of Systems  All other systems reviewed and are negative.    Allergies  Review of patient's allergies indicates no known allergies.  Home Medications   Current Outpatient Rx  Name Route Sig Dispense Refill  . MAGIC MOUTHWASH Oral Take 5 mLs by mouth as needed.      Marland Kitchen CALCIUM CARBONATE 1250 MG PO TABS Oral Take 1 tablet by mouth daily.    Marland Kitchen VITAMIN D 1000 UNITS PO TABS Oral Take 1,000 Units by mouth daily.     Marland Kitchen DEXAMETHASONE 4 MG PO TABS Oral Take 4 mg by mouth every Monday.     Alfonso Patten 3 MG PO TABS Oral Take 3 mg by mouth at bedtime.     Marland Kitchen GABAPENTIN 100 MG PO CAPS Oral Take 100 mg by mouth 3 (three) times daily.     Marland Kitchen HYDROCODONE-ACETAMINOPHEN 7.5-750 MG PO TABS  Oral Take 1 tablet by mouth every 6 (six) hours as needed. 60 tablet 0  . LEVOTHYROXINE SODIUM 100 MCG PO TABS Oral Take 50-100 mcg by mouth as directed. 1 tab x 3 days a week Monday,wednesday,friday 1/2 tab all others day    . LIDOCAINE-PRILOCAINE 2.5-2.5 % EX CREA Topical Apply topically as needed.      Marland Kitchen MAGNESIUM CHLORIDE 64 MG PO TBEC Oral Take 1 tablet by mouth daily.      . ADULT MULTIVITAMIN W/MINERALS CH Oral Take 1 tablet by mouth daily.    . OXYCODONE HCL ER 20 MG PO TB12 Oral Take 20 mg by mouth every 12 (twelve) hours. Pt now primarily taking 1 tab at night    . PANTOPRAZOLE SODIUM 40 MG PO TBEC  TAKE 1 TABLET BY MOUTH EVERY DAY 30 tablet 1  . PRESCRIPTION MEDICATION  [facility]/Dr. Mohamed: Meloma Salvage DVD IV q21d Cycle 3 12/10-12/21. Cycle 4 scheduled for 12/31. Chemo treatments every Monday and Thursday    . PROCHLORPERAZINE MALEATE 10 MG PO TABS  Oral Take 10 mg by mouth every 6 (six) hours as needed. nAUSEA      BP 136/78  Pulse 104  Temp(Src) 98.6 F (37 C) (Oral)  Resp 16  SpO2 94%  Physical Exam General: Well-developed, well-nourished female in no acute distress; appears younger than age of record HENT: normocephalic, atraumatic Eyes: pupils equal round and reactive to light; extraocular muscles intact Neck: supple Heart: regular rate and rhythm; no murmurs, rubs or gallops Lungs: clear to auscultation bilaterally; rattly cough Abdomen: soft; nondistended; nontender Extremities: No deformity; full range of motion Neurologic: Awake, alert and oriented; motor function intact in all extremities and symmetric; no facial droop Skin: Warm and dry Psychiatric: Normal mood and affect    ED Course  Procedures (including critical care time)    MDM   Nursing notes and vitals signs, including pulse oximetry, reviewed.  Summary of this visit's results, reviewed by myself:  Imaging Studies: Dg Chest 2 View  09/07/2011  *RADIOLOGY REPORT*  Clinical Data: Cough, shortness of breath, recent pneumonia.  CHEST - 2 VIEW  Comparison: 08/14/2011.  Findings: Normal sized heart.  Stable right jugular porta-catheter. Diffusely prominent interstitial markings without significant change.  These are slightly more prominent in the right upper lobe without discrete airspace consolidation. This appearance is improved.  Central peribronchial thickening.  Unremarkable bones.  IMPRESSION:  1.  Mild to moderate bronchitic changes. 2.  Chronic interstitial lung disease. 3.  Slight residual increased prominence of interstitial markings in the right upper lobe without discrete airspace consolidation.  Original Report Authenticated By: Darrol Angel, M.D.            Hanley Seamen, MD 09/07/11 0145

## 2011-09-07 NOTE — Discharge Instructions (Signed)

## 2011-09-11 DIAGNOSIS — C9000 Multiple myeloma not having achieved remission: Secondary | ICD-10-CM | POA: Insufficient documentation

## 2011-09-14 LAB — AFB CULTURE WITH SMEAR (NOT AT ARMC)

## 2011-09-15 ENCOUNTER — Other Ambulatory Visit: Payer: Self-pay | Admitting: Internal Medicine

## 2011-09-24 ENCOUNTER — Other Ambulatory Visit: Payer: Self-pay

## 2011-09-24 DIAGNOSIS — C9000 Multiple myeloma not having achieved remission: Secondary | ICD-10-CM

## 2011-09-24 MED ORDER — HYDROCODONE-ACETAMINOPHEN 7.5-750 MG PO TABS: 1.0000 | ORAL_TABLET | Freq: Four times a day (QID) | ORAL | Status: DC | PRN

## 2011-10-07 ENCOUNTER — Other Ambulatory Visit: Payer: Self-pay | Admitting: Internal Medicine

## 2011-10-07 DIAGNOSIS — Z1231 Encounter for screening mammogram for malignant neoplasm of breast: Secondary | ICD-10-CM

## 2011-10-10 ENCOUNTER — Telehealth: Payer: Self-pay | Admitting: Medical Oncology

## 2011-10-10 ENCOUNTER — Other Ambulatory Visit: Payer: Self-pay | Admitting: Medical Oncology

## 2011-10-10 MED ORDER — OXYCODONE HCL 20 MG PO TB12: 20.0000 mg | ORAL_TABLET | Freq: Two times a day (BID) | ORAL | Status: DC

## 2011-10-10 NOTE — Telephone Encounter (Signed)
Request refill for oxycontin 20 . Last filled 09/11/11

## 2011-10-10 NOTE — Telephone Encounter (Signed)
Dr Oneta Rack wants Dr. Donnald Garre to be aware of pt hgb for the past 2 months HGB- 10/08/11= 9  > march = 9.7 > Feb = 10. Marland KitchenLurena Joiner  will fax results . I will notify  Dr Donnald Garre to see if he wants pt to come in before May appointment.

## 2011-10-15 ENCOUNTER — Encounter: Payer: Self-pay | Admitting: Internal Medicine

## 2011-10-15 ENCOUNTER — Other Ambulatory Visit: Payer: Self-pay | Admitting: Medical Oncology

## 2011-10-15 NOTE — Progress Notes (Signed)
Called [employer] @ [phone] for oxycontin 20mg  30 tabs prior auth; should receive response within 24-72 hours.

## 2011-10-15 NOTE — Telephone Encounter (Signed)
I called pt and Dr Donnald Garre wants her to keep her may appointment - no need to see her before then ,.. Pt notified

## 2011-10-15 NOTE — Telephone Encounter (Signed)
Denial letter received for oxycontin. Called to [place] for authorization.

## 2011-10-16 ENCOUNTER — Other Ambulatory Visit: Payer: Self-pay | Admitting: Medical Oncology

## 2011-10-16 DIAGNOSIS — R52 Pain, unspecified: Secondary | ICD-10-CM

## 2011-10-16 MED ORDER — MORPHINE SULFATE CR 15 MG PO TB12: 30.0000 mg | ORAL_TABLET | Freq: Two times a day (BID) | ORAL | Status: DC

## 2011-10-16 NOTE — Telephone Encounter (Signed)
Per Dr Donnald Garre change pain med to MS contin 30mg  , Pt notified to pick up rx

## 2011-10-28 ENCOUNTER — Other Ambulatory Visit: Payer: Self-pay

## 2011-10-28 DIAGNOSIS — C9000 Multiple myeloma not having achieved remission: Secondary | ICD-10-CM

## 2011-10-28 MED ORDER — HYDROCODONE-ACETAMINOPHEN 7.5-750 MG PO TABS: 1.0000 | ORAL_TABLET | Freq: Four times a day (QID) | ORAL | Status: DC | PRN

## 2011-11-06 ENCOUNTER — Other Ambulatory Visit (HOSPITAL_BASED_OUTPATIENT_CLINIC_OR_DEPARTMENT_OTHER): Payer: Medicare HMO | Admitting: Lab

## 2011-11-06 ENCOUNTER — Telehealth: Payer: Self-pay | Admitting: Internal Medicine

## 2011-11-06 ENCOUNTER — Ambulatory Visit (HOSPITAL_BASED_OUTPATIENT_CLINIC_OR_DEPARTMENT_OTHER): Payer: Medicare HMO | Admitting: Internal Medicine

## 2011-11-06 VITALS — BP 103/61 | HR 73 | Temp 97.8°F | Wt 153.8 lb

## 2011-11-06 DIAGNOSIS — C9000 Multiple myeloma not having achieved remission: Secondary | ICD-10-CM

## 2011-11-06 LAB — CBC WITH DIFFERENTIAL/PLATELET
BASO%: 0.4 % (ref 0.0–2.0)
EOS%: 1.7 % (ref 0.0–7.0)
MCH: 31.5 pg (ref 25.1–34.0)
MCHC: 33.1 g/dL (ref 31.5–36.0)
NEUT%: 53.9 % (ref 38.4–76.8)
RBC: 3.14 10*6/uL — ABNORMAL LOW (ref 3.70–5.45)
RDW: 14.1 % (ref 11.2–14.5)
lymph#: 1.9 10*3/uL (ref 0.9–3.3)

## 2011-11-06 NOTE — Progress Notes (Signed)
[facility] [phone]   [phone]  OFFICE PROGRESS NOTE  [name],[name], PA, PA No address on file  DIAGNOSIS: Recurrent multiple myeloma initially diagnosed in October 2008.   PRIOR THERAPY:  1. Status post palliative radiotherapy to the lumbar spine between L3 and L5. The patient received a total dose of 3000 cGy in 10 fractions under the care of Dr. Mitzi Hansen between May 05, 2007 through May 18, 2007. 2. Status post 5 cycles of systemic chemotherapy with Revlimid and low-dose Decadron with good response to this treatment. 3. Status post autologous peripheral blood stem cell transplant at [facility] on Nov 20, 2007 under the care of Dr. Vicente Serene. 4. Status post treatment for disease recurrence with Velcade, Doxil and Decadron. Last dose given Nov 09, 2009. Discontinued secondary to intolerance but the patient had a good response to treatment at that time. 5. Status post palliative radiotherapy to the T2-T6 thoracic vertebrae completed 03/21/2011 under the care of Dr. Mitzi Hansen.  CURRENT THERAPY: : Systemic chemotherapy with Velcade 1.3 mg per meter squared given on days 1, 4, 8 and 11, and Doxil at 30 mg per meter squared given on day 4 in addition to Decadron status post 4 cycles.    INTERVAL HISTORY: [name] 61 y.o. female returns to the clinic today for three-month followup visit accompanied by her son and daughter. The patient has no complaints today except for occasional intermittent low back pain as well as peripheral neuropathy. She denied having any significant chest pain or shortness of breath. She has no weight loss or night sweats. She is currently on observation. She has repeat CBC, comprehensive to expanded, LDH and myeloma panel performed earlier today and she is here today for evaluation and discussion of her lab results.  MEDICAL HISTORY: Past Medical History  Diagnosis Date  . Thyroid disease Hypothyroidism  .  Hypercholesterolemia   . Compression fracture 04/08/2007    pathologic compression fracture  . Hypothyroidism   . FHx: chemotherapy     s/p 5 cycle revlimid/low dose decadron,s/p velcade,doxil,decadron,  . Hx of radiation therapy 05/05/07-05/18/07,& 03/05/11-03/21/11-    l3&l5 in 2008, t2-t6 03/2011  . GERD (gastroesophageal reflux disease)   . Insomnia     associated with steroids  . Nausea   . Constipation     takes oxycontin,vicodin  . Hx of radiation therapy 05/05/2007 to 05/18/2007    palliative, L3-5  . 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 Hx of radiation therapy 03/05/2011 to 03/21/2011    palliative T2-T6, c-spine  . 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  MEDICATIONS:  Current Outpatient Prescriptions  Medication Sig Dispense Refill  . cholecalciferol (VITAMIN D) 1000 UNITS tablet Take 1,000 Units by mouth daily.       Marland Kitchen ESZOPICLONE 3 MG tablet Take 3 mg by mouth at bedtime.       . gabapentin (NEURONTIN) 100 MG capsule Take 100 mg by mouth 3 (three) times daily.       Marland Kitchen HYDROcodone-acetaminophen (VICODIN ES) 7.5-750 MG per tablet Take 1 tablet by mouth every 6 (six) hours as needed.  40 tablet  0  . levothyroxine (SYNTHROID, LEVOTHROID) 100 MCG tablet Take 50-100 mcg by mouth as directed. 1 tab x 3 days a week Monday,wednesday,friday 1/2 tab all others day      . lidocaine-prilocaine (EMLA) cream Apply  topically as needed.        . magnesium chloride (SLOW-MAG) 64 MG TBEC Take 1 tablet by mouth daily.        Marland Kitchen morphine (MS CONTIN) 15 MG 12 hr tablet Take 15 mg by mouth 2 (two) times daily. Take 2 tablets BID      . pantoprazole (PROTONIX) 40 MG tablet TAKE 1 TABLET BY MOUTH DAILY  30 tablet  1  . PRESCRIPTION MEDICATION [facility]/Dr. Laressa Bolinger: Meloma Salvage DVD IV q21d Cycle 3 12/10-12/21. Cycle 4 scheduled for 12/31. Chemo treatments every Monday and Thursday      . prochlorperazine (COMPAZINE) 10 MG tablet  Take 10 mg by mouth every 6 (six) hours as needed. nAUSEA      . calcium carbonate (OS-CAL - DOSED IN MG OF ELEMENTAL CALCIUM) 1250 MG tablet Take 1 tablet by mouth daily.      Marland Kitchen dexamethasone (DECADRON) 4 MG tablet Take 4 mg by mouth every Monday.       . Multiple Vitamin (MULITIVITAMIN WITH MINERALS) TABS Take 1 tablet by mouth daily.       No current facility-administered medications for this visit.   Facility-Administered Medications Ordered in Other Visits  Medication Dose Route Frequency Provider Last Rate Last Dose  . 0.9 %  sodium chloride infusion   Intravenous Once Si Gaul, MD      . ondansetron (ZOFRAN) IVPB 8 mg  8 mg Intravenous Once Si Gaul, MD      . sodium chloride 0.9 % injection 10 mL  10 mL Intracatheter PRN Si Gaul, MD        SURGICAL HISTORY:  Past Surgical History  Procedure Date  . Cholecystectomy   . Knee surgery   . Knee surgery   . Video bronchoscopy 07/30/2011    Procedure: VIDEO BRONCHOSCOPY WITHOUT FLUORO;  Surgeon: Barbaraann Share, MD;  Location: [facility];  Service: Cardiopulmonary;  Laterality: Bilateral;    REVIEW OF SYSTEMS:  A comprehensive review of systems was negative except for: Constitutional: positive for fatigue Musculoskeletal: positive for back pain and bone pain   PHYSICAL EXAMINATION: General appearance: alert, cooperative and no distress Head: Normocephalic, without obvious abnormality, atraumatic Neck: no adenopathy Lymph nodes: Cervical, supraclavicular, and axillary nodes normal. Resp: clear to auscultation bilaterally Cardio: regular rate and rhythm, S1, S2 normal, no murmur, click, rub or gallop GI: soft, non-tender; bowel sounds normal; no masses,  no organomegaly Extremities: extremities normal, atraumatic, no cyanosis or edema Neurologic: Alert and oriented X 3, normal strength and tone. Normal symmetric reflexes. Normal coordination and gait  ECOG PERFORMANCE STATUS: 1 - Symptomatic but completely  ambulatory  Blood pressure 103/61, pulse 73, temperature 97.8 F (36.6 C), temperature source Oral, weight 153 lb 12.8 oz (69.763 kg).  LABORATORY DATA: Lab Results  Component Value Date   WBC 5.3 11/06/2011   HGB 9.9* 11/06/2011   HCT 29.9* 11/06/2011   MCV 95.2 11/06/2011   PLT 210 11/06/2011      Chemistry      Component Value Date/Time   NA 140 08/05/2011 1217   K 3.3* 08/05/2011 1217   CL 106 08/05/2011 1217   CO2 21 08/05/2011 1217   BUN 10 08/05/2011 1217   CREATININE 1.28* 08/05/2011 1217      Component Value Date/Time   CALCIUM 9.1 08/05/2011 1217   ALKPHOS 162* 08/05/2011 1217   AST 41* 08/05/2011 1217   ALT 24 08/05/2011 1217   BILITOT 0.6 08/05/2011 1217  RADIOGRAPHIC STUDIES: No results found.  ASSESSMENT: This is a very pleasant 61 years old American female with history of multiple myeloma currently on observation. The patient is doing fine. Her myeloma panel is still pending, but recent studies performed at [facility] showed stable disease.  PLAN: I recommended for the patient continuous observation for now if her myeloma showed no evidence for disease progression. I would see her back for followup visit in 3 months with repeat CBC, respiratory panel, LDH and myeloma panel. The patient will also have a skeletal bone survey performed in the next few days.  If there is any significant abnormalities or evidence for disease progression, I would call the patient with the results and bring her back for discussion of treatment options.  All questions were answered. The patient knows to call the clinic with any problems, questions or concerns. We can certainly see the patient much sooner if necessary.

## 2011-11-06 NOTE — Telephone Encounter (Signed)
Gv pt appt for aug2013.  scheduled bone survey for 05/10 @ [facility]

## 2011-11-08 ENCOUNTER — Other Ambulatory Visit: Payer: Self-pay | Admitting: Internal Medicine

## 2011-11-08 ENCOUNTER — Ambulatory Visit (HOSPITAL_COMMUNITY)
Admission: RE | Admit: 2011-11-08 | Discharge: 2011-11-08 | Disposition: A | Discharge status: Home / Self Care | Payer: Medicare HMO | Source: Ambulatory Visit | Attending: Internal Medicine | Admitting: Internal Medicine

## 2011-11-08 DIAGNOSIS — C9000 Multiple myeloma not having achieved remission: Secondary | ICD-10-CM | POA: Insufficient documentation

## 2011-11-08 DIAGNOSIS — M8448XA Pathological fracture, other site, initial encounter for fracture: Secondary | ICD-10-CM | POA: Insufficient documentation

## 2011-11-08 DIAGNOSIS — I6529 Occlusion and stenosis of unspecified carotid artery: Secondary | ICD-10-CM | POA: Insufficient documentation

## 2011-11-08 DIAGNOSIS — M47812 Spondylosis without myelopathy or radiculopathy, cervical region: Secondary | ICD-10-CM | POA: Insufficient documentation

## 2011-11-08 DIAGNOSIS — M899 Disorder of bone, unspecified: Secondary | ICD-10-CM | POA: Insufficient documentation

## 2011-11-08 LAB — COMPREHENSIVE METABOLIC PANEL
ALT: 16 U/L (ref 0–35)
AST: 17 U/L (ref 0–37)
Calcium: 9.6 mg/dL (ref 8.4–10.5)
Chloride: 107 mEq/L (ref 96–112)
Creatinine, Ser: 0.95 mg/dL (ref 0.50–1.10)
Potassium: 3.6 mEq/L (ref 3.5–5.3)
Sodium: 140 mEq/L (ref 135–145)

## 2011-11-08 LAB — SPEP & IFE WITH QIG
Albumin ELP: 57.3 % (ref 55.8–66.1)
Alpha-1-Globulin: 4.4 % (ref 2.9–4.9)
Alpha-2-Globulin: 11.6 % (ref 7.1–11.8)
Beta Globulin: 5.4 % (ref 4.7–7.2)
IgG (Immunoglobin G), Serum: 1270 mg/dL (ref 690–1700)
M-Spike, %: 0.43 g/dL
Total Protein, Serum Electrophoresis: 6.6 g/dL (ref 6.0–8.3)

## 2011-11-08 LAB — KAPPA/LAMBDA LIGHT CHAINS: Kappa free light chain: 3.4 mg/dL — ABNORMAL HIGH (ref 0.33–1.94)

## 2011-11-11 ENCOUNTER — Other Ambulatory Visit (HOSPITAL_COMMUNITY): Payer: Medicare HMO

## 2011-11-12 ENCOUNTER — Telehealth: Payer: Self-pay | Admitting: *Deleted

## 2011-11-12 ENCOUNTER — Ambulatory Visit
Admission: RE | Admit: 2011-11-12 | Discharge: 2011-11-12 | Disposition: A | Discharge status: Home / Self Care | Payer: Medicare HMO | Source: Ambulatory Visit | Attending: Internal Medicine | Admitting: Internal Medicine

## 2011-11-12 DIAGNOSIS — Z1231 Encounter for screening mammogram for malignant neoplasm of breast: Secondary | ICD-10-CM

## 2011-11-12 NOTE — Telephone Encounter (Signed)
Pt called wanting to know how her labs are.  Per Dr Donnald Garre, there is a "mild increase" but he would still just like for her to stay on observation and he will f/u with her at next appt in August,  she verbalized understanding.  SLJ

## 2011-11-15 ENCOUNTER — Other Ambulatory Visit: Payer: Self-pay | Admitting: Medical Oncology

## 2011-11-15 DIAGNOSIS — R52 Pain, unspecified: Secondary | ICD-10-CM

## 2011-11-15 MED ORDER — MORPHINE SULFATE ER 15 MG PO TBCR: 15.0000 mg | EXTENDED_RELEASE_TABLET | Freq: Two times a day (BID) | ORAL | Status: AC

## 2011-11-15 NOTE — Telephone Encounter (Signed)
Requests refill for ms contin 15 . Chart reflects  that ms contin date for refill was  on may 8th . I called her pharmacy and they do not have a record of her getting ms contin on may 8th or any other medicine. rx given to adrena for refill and pt notified to pick it up today from injection room.

## 2011-12-09 ENCOUNTER — Other Ambulatory Visit: Payer: Self-pay | Admitting: *Deleted

## 2011-12-09 DIAGNOSIS — C9000 Multiple myeloma not having achieved remission: Secondary | ICD-10-CM

## 2011-12-09 MED ORDER — HYDROCODONE-ACETAMINOPHEN 7.5-750 MG PO TABS: 1.0000 | ORAL_TABLET | Freq: Four times a day (QID) | ORAL | Status: DC | PRN

## 2011-12-18 ENCOUNTER — Other Ambulatory Visit: Payer: Self-pay | Admitting: Medical Oncology

## 2011-12-18 DIAGNOSIS — C9000 Multiple myeloma not having achieved remission: Secondary | ICD-10-CM

## 2011-12-18 MED ORDER — MORPHINE SULFATE ER 15 MG PO TBCR: 15.0000 mg | EXTENDED_RELEASE_TABLET | Freq: Two times a day (BID) | ORAL | Status: DC

## 2011-12-18 NOTE — Telephone Encounter (Signed)
Requests ms contin refill and will pick up today at 1500

## 2011-12-25 ENCOUNTER — Other Ambulatory Visit: Payer: Self-pay | Admitting: Medical Oncology

## 2011-12-25 NOTE — Telephone Encounter (Signed)
states rx for  morphine was not correct . I called pharmacy and rx for ms contin was written incorrectly as ms contin 15 po every 12 h #60. It should  should be ms contin , take 2 tablets every 12 hours #60. I told pt to take 2 tablets every 12 hours and to call for a refill when 2-3 days supply is left.  Pt said the vicodin bottle instructions say take 1 every 12 hours with a quantity of #40. I told her that is not what was called in and the pharmacist confirmed it ( Vicodin ES,1 every 6 hours #40). I told pt to take her pill bottle back to pharamacy so they can correct instructions. She understands .

## 2011-12-31 ENCOUNTER — Encounter: Payer: Self-pay | Admitting: Specialist

## 2011-12-31 NOTE — Progress Notes (Signed)
I called patient at her request to give her information about the Multiple Myeloma Support Group and also the survivorship program, Finding Your New Normal.  She expressed interest in participating in both of those programs.

## 2012-01-01 ENCOUNTER — Encounter: Payer: Self-pay | Admitting: *Deleted

## 2012-01-01 NOTE — Progress Notes (Signed)
Pt came to Dr Asa Lente RN desk wanting to talk to desk RN.  She stated that she has been having h/a intermittingly with no blurry vision or trouble balancing.  She also stated she has a pain in her side sometimes.  Per Tiana Loft, pt needs to talk to PCP.  Also emphasized to pt that she cannot walk back to RN desk and needs to call or do a walk-in clinic form, she verbalized understanding.  SLJ

## 2012-01-09 ENCOUNTER — Other Ambulatory Visit: Payer: Self-pay | Admitting: Internal Medicine

## 2012-01-09 DIAGNOSIS — C9000 Multiple myeloma not having achieved remission: Secondary | ICD-10-CM

## 2012-01-22 ENCOUNTER — Other Ambulatory Visit: Payer: Self-pay | Admitting: Physician Assistant

## 2012-01-22 ENCOUNTER — Other Ambulatory Visit: Payer: Self-pay | Admitting: Medical Oncology

## 2012-01-22 DIAGNOSIS — C9000 Multiple myeloma not having achieved remission: Secondary | ICD-10-CM

## 2012-01-22 MED ORDER — MORPHINE SULFATE ER 15 MG PO TBCR: 15.0000 mg | EXTENDED_RELEASE_TABLET | Freq: Two times a day (BID) | ORAL | Status: DC

## 2012-01-22 NOTE — Telephone Encounter (Signed)
Pt came in today to r/s f/u to 8/5 due to she was going out of town and then came back to r/s 8/5 due to she would be leaving 8/5. Pt given new appt for 8/13 and will keep lb appt for 7/30.

## 2012-01-22 NOTE — Telephone Encounter (Signed)
Requests refill today -written and put in injection room

## 2012-01-28 ENCOUNTER — Other Ambulatory Visit (HOSPITAL_BASED_OUTPATIENT_CLINIC_OR_DEPARTMENT_OTHER): Payer: Medicare HMO

## 2012-01-28 DIAGNOSIS — C9000 Multiple myeloma not having achieved remission: Secondary | ICD-10-CM

## 2012-01-28 LAB — CBC WITH DIFFERENTIAL/PLATELET
Basophils Absolute: 0 10*3/uL (ref 0.0–0.1)
Eosinophils Absolute: 0.1 10*3/uL (ref 0.0–0.5)
HCT: 33.6 % — ABNORMAL LOW (ref 34.8–46.6)
HGB: 11.2 g/dL — ABNORMAL LOW (ref 11.6–15.9)
LYMPH%: 37 % (ref 14.0–49.7)
MCV: 96.8 fL (ref 79.5–101.0)
MONO%: 9.9 % (ref 0.0–14.0)
NEUT#: 2.1 10*3/uL (ref 1.5–6.5)
Platelets: 197 10*3/uL (ref 145–400)
RDW: 14.6 % — ABNORMAL HIGH (ref 11.2–14.5)

## 2012-01-29 ENCOUNTER — Other Ambulatory Visit: Payer: Self-pay

## 2012-01-29 DIAGNOSIS — C9000 Multiple myeloma not having achieved remission: Secondary | ICD-10-CM

## 2012-01-29 LAB — KAPPA/LAMBDA LIGHT CHAINS
Kappa free light chain: 2.89 mg/dL — ABNORMAL HIGH (ref 0.33–1.94)
Lambda Free Lght Chn: 6.62 mg/dL — ABNORMAL HIGH (ref 0.57–2.63)

## 2012-01-29 LAB — COMPREHENSIVE METABOLIC PANEL
Albumin: 4.3 g/dL (ref 3.5–5.2)
Alkaline Phosphatase: 64 U/L (ref 39–117)
BUN: 15 mg/dL (ref 6–23)
Glucose, Bld: 75 mg/dL (ref 70–99)
Potassium: 3.8 mEq/L (ref 3.5–5.3)

## 2012-01-29 LAB — IGG, IGA, IGM: IgG (Immunoglobin G), Serum: 1450 mg/dL (ref 690–1700)

## 2012-01-29 MED ORDER — HYDROCODONE-ACETAMINOPHEN 7.5-750 MG PO TABS: 1.0000 | ORAL_TABLET | Freq: Four times a day (QID) | ORAL | Status: DC | PRN

## 2012-02-03 ENCOUNTER — Ambulatory Visit: Payer: Medicare HMO | Admitting: Internal Medicine

## 2012-02-04 ENCOUNTER — Ambulatory Visit: Payer: Medicare HMO | Admitting: Internal Medicine

## 2012-02-05 ENCOUNTER — Telehealth: Payer: Self-pay | Admitting: Internal Medicine

## 2012-02-05 NOTE — Telephone Encounter (Signed)
pt l/m to ck on appt,l/m with  appt info

## 2012-02-11 ENCOUNTER — Telehealth: Payer: Self-pay | Admitting: Internal Medicine

## 2012-02-11 ENCOUNTER — Ambulatory Visit (HOSPITAL_BASED_OUTPATIENT_CLINIC_OR_DEPARTMENT_OTHER): Payer: Medicare HMO | Admitting: Internal Medicine

## 2012-02-11 VITALS — BP 128/72 | HR 75 | Temp 99.3°F | Resp 20 | Ht 65.0 in | Wt 162.9 lb

## 2012-02-11 DIAGNOSIS — G609 Hereditary and idiopathic neuropathy, unspecified: Secondary | ICD-10-CM

## 2012-02-11 DIAGNOSIS — M79609 Pain in unspecified limb: Secondary | ICD-10-CM

## 2012-02-11 DIAGNOSIS — C9000 Multiple myeloma not having achieved remission: Secondary | ICD-10-CM

## 2012-02-11 NOTE — Telephone Encounter (Signed)
appts made and printed for pt  °

## 2012-02-11 NOTE — Progress Notes (Signed)
[facility] [phone]   [phone]  OFFICE PROGRESS NOTE  [name],[name], PA No address on file  DIAGNOSIS: Recurrent multiple myeloma initially diagnosed in October 2008.   PRIOR THERAPY:  1. Status post palliative radiotherapy to the lumbar spine between L3 and L5. The patient received a total dose of 3000 cGy in 10 fractions under the care of Dr. Mitzi Hansen between May 05, 2007 through May 18, 2007. 2. Status post 5 cycles of systemic chemotherapy with Revlimid and low-dose Decadron with good response to this treatment. 3. Status post autologous peripheral blood stem cell transplant at [facility] on Nov 20, 2007 under the care of Dr. Vicente Serene. 4. Status post treatment for disease recurrence with Velcade, Doxil and Decadron. Last dose given Nov 09, 2009. Discontinued secondary to intolerance but the patient had a good response to treatment at that time. 5. Status post palliative radiotherapy to the T2-T6 thoracic vertebrae completed 03/21/2011 under the care of Dr. Mitzi Hansen. 6. Systemic chemotherapy with Velcade 1.3 mg per meter squared given on days 1, 4, 8 and 11, and Doxil at 30 mg per meter squared given on day 4 in addition to Decadron status post 4 cycles, discontinued secondary to tolerance.   CURRENT THERAPY: Observation.  INTERVAL HISTORY: [name] 61 y.o. female returns to the clinic today for routine three-month followup visit. The patient is feeling fine today with no specific complaints except for occasional sharp shooting pain to the right leg as well as peripheral neuropathy. She denied having any significant weight loss or night sweats. She denied having any chest pain or shortness breath. She denied having any nausea or vomiting. The patient has repeat CBC, comprehensive metabolic panel, LDH and myeloma panel performed recently and she is here today for evaluation and discussion of her lab results.  MEDICAL  HISTORY: Past Medical History  Diagnosis Date  . Thyroid disease Hypothyroidism  . Hypercholesterolemia   . Compression fracture 04/08/2007    pathologic compression fracture  . Hypothyroidism   . FHx: chemotherapy     s/p 5 cycle revlimid/low dose decadron,s/p velcade,doxil,decadron,  . Hx of radiation therapy 05/05/07-05/18/07,& 03/05/11-03/21/11-    l3&l5 in 2008, t2-t6 03/2011  . GERD (gastroesophageal reflux disease)   . Insomnia     associated with steroids  . Nausea   . Constipation     takes oxycontin,vicodin  . Hx of radiation therapy 05/05/2007 to 05/18/2007    palliative, L3-5  . 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 Hx of radiation therapy 03/05/2011 to 03/21/2011    palliative T2-T6, c-spine  . 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  MEDICATIONS:  Current Outpatient Prescriptions  Medication Sig Dispense Refill  . calcium carbonate (OS-CAL - DOSED IN MG OF ELEMENTAL CALCIUM) 1250 MG tablet Take 1 tablet by mouth daily.      . cholecalciferol (VITAMIN D) 1000 UNITS tablet Take 1,000 Units by mouth daily.       Marland Kitchen dexamethasone (DECADRON) 4 MG tablet Take 4 mg by mouth every Monday.       Marland Kitchen ESZOPICLONE 3 MG tablet Take 3 mg by mouth at bedtime.       . gabapentin (NEURONTIN) 100 MG capsule Take 100 mg by mouth 3 (three) times daily.       Marland Kitchen HYDROcodone-acetaminophen (VICODIN ES) 7.5-750 MG per tablet Take 1 tablet  by mouth every 6 (six) hours as needed.  30 tablet  0  . levothyroxine (SYNTHROID, LEVOTHROID) 100 MCG tablet Take 50-100 mcg by mouth as directed. 1 tab x 3 days a week Monday,wednesday,friday 1/2 tab all others day      . lidocaine-prilocaine (EMLA) cream Apply topically as needed.        . magnesium chloride (SLOW-MAG) 64 MG TBEC Take 1 tablet by mouth daily.        Marland Kitchen morphine (MS CONTIN) 15 MG 12 hr tablet Take 1 tablet (15 mg total) by mouth 2 (two) times daily.  60 tablet  0  . Multiple  Vitamin (MULITIVITAMIN WITH MINERALS) TABS Take 1 tablet by mouth daily.      . pantoprazole (PROTONIX) 40 MG tablet TAKE 1 TABLET BY MOUTH DAILY  30 tablet  2  . PRESCRIPTION MEDICATION [facility]/Dr. Hugh Kamara: Meloma Salvage DVD IV q21d Cycle 3 12/10-12/21. Cycle 4 scheduled for 12/31. Chemo treatments every Monday and Thursday      . prochlorperazine (COMPAZINE) 10 MG tablet Take 10 mg by mouth every 6 (six) hours as needed. nAUSEA       No current facility-administered medications for this visit.   Facility-Administered Medications Ordered in Other Visits  Medication Dose Route Frequency Provider Last Rate Last Dose  . 0.9 %  sodium chloride infusion   Intravenous Once Si Gaul, MD      . ondansetron (ZOFRAN) IVPB 8 mg  8 mg Intravenous Once Si Gaul, MD      . sodium chloride 0.9 % injection 10 mL  10 mL Intracatheter PRN Si Gaul, MD        SURGICAL HISTORY:  Past Surgical History  Procedure Date  . Cholecystectomy   . Knee surgery   . Knee surgery   . Video bronchoscopy 07/30/2011    Procedure: VIDEO BRONCHOSCOPY WITHOUT FLUORO;  Surgeon: Barbaraann Share, MD;  Location: [facility];  Service: Cardiopulmonary;  Laterality: Bilateral;    REVIEW OF SYSTEMS:  A comprehensive review of systems was negative except for: Neurological: positive for paresthesia   PHYSICAL EXAMINATION: General appearance: alert, cooperative and no distress Head: Normocephalic, without obvious abnormality, atraumatic Neck: no adenopathy Lymph nodes: Cervical, supraclavicular, and axillary nodes normal. Resp: clear to auscultation bilaterally Cardio: regular rate and rhythm, S1, S2 normal, no murmur, click, rub or gallop GI: soft, non-tender; bowel sounds normal; no masses,  no organomegaly Extremities: extremities normal, atraumatic, no cyanosis or edema Neurologic: Alert and oriented X 3, normal strength and tone. Normal symmetric reflexes. Normal coordination and gait  ECOG PERFORMANCE  STATUS: 1 - Symptomatic but completely ambulatory  There were no vitals taken for this visit.  LABORATORY DATA: Lab Results  Component Value Date   WBC 4.2 01/28/2012   HGB 11.2* 01/28/2012   HCT 33.6* 01/28/2012   MCV 96.8 01/28/2012   PLT 197 01/28/2012      Chemistry      Component Value Date/Time   NA 139 01/28/2012 1016   K 3.8 01/28/2012 1016   CL 106 01/28/2012 1016   CO2 26 01/28/2012 1016   BUN 15 01/28/2012 1016   CREATININE 0.91 01/28/2012 1016      Component Value Date/Time   CALCIUM 10.0 01/28/2012 1016   ALKPHOS 64 01/28/2012 1016   AST 16 01/28/2012 1016   ALT 13 01/28/2012 1016   BILITOT 0.9 01/28/2012 1016       RADIOGRAPHIC STUDIES: No results found.  ASSESSMENT: This is a very pleasant  61 years old [place] American female with recurrent multiple myeloma status post several treatment regimens, last treatment was in the form of Velcade, Doxil and Decadron in January of 2013 discontinued secondary to intolerance but the patient has significant improvement in her disease. She is currently on observation with no evidence for disease progression.  PLAN: I discussed the lab result with the patient. I recommended for her to continue on observation for now with repeat CBC, comprehensive metabolic panel, LDH and myeloma panel in 3 months. She would come back for followup visit at that time.  The patient was advised to call me immediately if she has any concerning symptoms in the interval.   All questions were answered. The patient knows to call the clinic with any problems, questions or concerns. We can certainly see the patient much sooner if necessary.

## 2012-02-12 ENCOUNTER — Encounter: Payer: Self-pay | Admitting: *Deleted

## 2012-02-12 NOTE — Progress Notes (Signed)
Per pt request, rx for "2 pairs of shoes for peripheral neuropathy" left at front desk to be picked up.  SLJ

## 2012-02-13 ENCOUNTER — Telehealth: Payer: Self-pay | Admitting: *Deleted

## 2012-02-13 NOTE — Telephone Encounter (Signed)
Pt called stating that she is able to get shoes to help with peripheral neuropathy and she just needs an order from Dr Donnald Garre.  Order for "2 pairs of peripheral neuropathy shoes" left at the front desk for pt to pick up.  SLJ

## 2012-03-03 ENCOUNTER — Other Ambulatory Visit: Payer: Self-pay | Admitting: *Deleted

## 2012-03-03 DIAGNOSIS — C9000 Multiple myeloma not having achieved remission: Secondary | ICD-10-CM

## 2012-03-03 MED ORDER — HYDROCODONE-ACETAMINOPHEN 7.5-750 MG PO TABS: 1.0000 | ORAL_TABLET | Freq: Four times a day (QID) | ORAL | Status: DC | PRN

## 2012-03-03 MED ORDER — MORPHINE SULFATE ER 15 MG PO TBCR: 15.0000 mg | EXTENDED_RELEASE_TABLET | Freq: Two times a day (BID) | ORAL | Status: DC

## 2012-03-04 ENCOUNTER — Telehealth: Payer: Self-pay | Admitting: Internal Medicine

## 2012-03-04 NOTE — Telephone Encounter (Signed)
Pt came by  to make flush appt,made and printed for pt aom

## 2012-03-10 ENCOUNTER — Telehealth: Payer: Self-pay | Admitting: Internal Medicine

## 2012-03-10 NOTE — Telephone Encounter (Signed)
Added additional flush appt for 12/4. Pt will get new schedule when she comes in for flush 10/23.

## 2012-04-02 ENCOUNTER — Other Ambulatory Visit: Payer: Self-pay | Admitting: Internal Medicine

## 2012-04-02 DIAGNOSIS — C3490 Malignant neoplasm of unspecified part of unspecified bronchus or lung: Secondary | ICD-10-CM

## 2012-04-06 ENCOUNTER — Other Ambulatory Visit: Payer: Self-pay | Admitting: *Deleted

## 2012-04-06 ENCOUNTER — Other Ambulatory Visit: Payer: Self-pay | Admitting: Internal Medicine

## 2012-04-06 DIAGNOSIS — C9000 Multiple myeloma not having achieved remission: Secondary | ICD-10-CM

## 2012-04-06 MED ORDER — MORPHINE SULFATE ER 15 MG PO TBCR: 15.0000 mg | EXTENDED_RELEASE_TABLET | Freq: Two times a day (BID) | ORAL | Status: DC

## 2012-04-15 ENCOUNTER — Other Ambulatory Visit: Payer: Self-pay | Admitting: Internal Medicine

## 2012-04-27 ENCOUNTER — Telehealth: Payer: Self-pay | Admitting: Medical Oncology

## 2012-04-27 ENCOUNTER — Telehealth: Payer: Self-pay | Admitting: Internal Medicine

## 2012-04-27 NOTE — Telephone Encounter (Signed)
Pt left message requesting rx for special shoes. i will ask Judeth Cornfield to follow up tomorrow

## 2012-04-27 NOTE — Telephone Encounter (Signed)
pt called to move 10/23 flush to 10/29

## 2012-04-28 ENCOUNTER — Telehealth: Payer: Self-pay | Admitting: *Deleted

## 2012-04-28 ENCOUNTER — Other Ambulatory Visit: Payer: Self-pay | Admitting: *Deleted

## 2012-04-28 NOTE — Telephone Encounter (Signed)
Pt called stating that she has been waiting for her shoes to come in since August but they have been on back order (rx given to pt 02/13/12 for 2 pairs of periph neuropathy shoes).  She took the rx back and attempted to go to a local place where they are available.  She was then told by the insurance company that she needs authorization for the shoes.  Axel Filler informed in medical mgmt.  SLJ

## 2012-04-28 NOTE — Telephone Encounter (Signed)
Per Axel Filler, pt will need a TAX ID # and name of facility she will take the rx to in order to get the prior auth processed.  Karel Jarvis stated that the place she takes it to should be the one who processes the prior [place] and insurance info.  Pt verbalized understanding and stated she will take her rx to a new facility to process her order.  She is to call if she has any other issues.  Ebony informed in medical mgmt.  SLJ

## 2012-04-29 ENCOUNTER — Ambulatory Visit: Payer: Medicare HMO

## 2012-04-29 NOTE — Progress Notes (Signed)
Patient did not show for appt

## 2012-04-30 ENCOUNTER — Telehealth: Payer: Self-pay | Admitting: Internal Medicine

## 2012-04-30 NOTE — Telephone Encounter (Signed)
pt  called in to r/s port flush    aom

## 2012-05-06 ENCOUNTER — Other Ambulatory Visit: Payer: Self-pay | Admitting: *Deleted

## 2012-05-06 DIAGNOSIS — C9000 Multiple myeloma not having achieved remission: Secondary | ICD-10-CM

## 2012-05-06 MED ORDER — MORPHINE SULFATE ER 15 MG PO TBCR: 15.0000 mg | EXTENDED_RELEASE_TABLET | Freq: Two times a day (BID) | ORAL | Status: DC

## 2012-05-12 ENCOUNTER — Other Ambulatory Visit (HOSPITAL_BASED_OUTPATIENT_CLINIC_OR_DEPARTMENT_OTHER): Payer: Medicare HMO | Admitting: Lab

## 2012-05-12 ENCOUNTER — Ambulatory Visit (HOSPITAL_BASED_OUTPATIENT_CLINIC_OR_DEPARTMENT_OTHER): Payer: Medicare HMO

## 2012-05-12 ENCOUNTER — Other Ambulatory Visit: Payer: Self-pay | Admitting: Gastroenterology

## 2012-05-12 VITALS — BP 106/64 | HR 90 | Temp 97.2°F | Resp 18

## 2012-05-12 DIAGNOSIS — R109 Unspecified abdominal pain: Secondary | ICD-10-CM

## 2012-05-12 DIAGNOSIS — C9001 Multiple myeloma in remission: Secondary | ICD-10-CM

## 2012-05-12 DIAGNOSIS — C9000 Multiple myeloma not having achieved remission: Secondary | ICD-10-CM

## 2012-05-12 LAB — CBC WITH DIFFERENTIAL/PLATELET
Basophils Absolute: 0 10*3/uL (ref 0.0–0.1)
Eosinophils Absolute: 0.1 10*3/uL (ref 0.0–0.5)
HCT: 32.9 % — ABNORMAL LOW (ref 34.8–46.6)
HGB: 11.5 g/dL — ABNORMAL LOW (ref 11.6–15.9)
LYMPH%: 18.1 % (ref 14.0–49.7)
MCV: 97.2 fL (ref 79.5–101.0)
MONO%: 4.3 % (ref 0.0–14.0)
NEUT#: 6.4 10*3/uL (ref 1.5–6.5)
NEUT%: 76.6 % (ref 38.4–76.8)
Platelets: 190 10*3/uL (ref 145–400)
RBC: 3.39 10*6/uL — ABNORMAL LOW (ref 3.70–5.45)

## 2012-05-12 LAB — COMPREHENSIVE METABOLIC PANEL (CC13)
AST: 21 U/L (ref 5–34)
Albumin: 3.4 g/dL — ABNORMAL LOW (ref 3.5–5.0)
Alkaline Phosphatase: 106 U/L (ref 40–150)
Potassium: 3.8 mEq/L (ref 3.5–5.1)
Sodium: 139 mEq/L (ref 136–145)
Total Bilirubin: 0.59 mg/dL (ref 0.20–1.20)
Total Protein: 7 g/dL (ref 6.4–8.3)

## 2012-05-12 MED ORDER — SODIUM CHLORIDE 0.9 % IJ SOLN
10.0000 mL | INTRAMUSCULAR | Status: DC | PRN
  Administered 2012-05-12: 10 mL via INTRAVENOUS
  Filled 2012-05-12: qty 10

## 2012-05-12 MED ORDER — HEPARIN SOD (PORK) LOCK FLUSH 100 UNIT/ML IV SOLN
500.0000 [IU] | Freq: Once | INTRAVENOUS | Status: AC
  Administered 2012-05-12: 500 [IU] via INTRAVENOUS
  Filled 2012-05-12: qty 5

## 2012-05-13 LAB — IGG, IGA, IGM
IgA: 243 mg/dL (ref 69–380)
IgG (Immunoglobin G), Serum: 1310 mg/dL (ref 690–1700)

## 2012-05-18 ENCOUNTER — Ambulatory Visit (HOSPITAL_BASED_OUTPATIENT_CLINIC_OR_DEPARTMENT_OTHER): Payer: Medicare HMO | Admitting: Internal Medicine

## 2012-05-18 ENCOUNTER — Telehealth: Payer: Self-pay | Admitting: Internal Medicine

## 2012-05-18 VITALS — BP 105/62 | HR 66 | Temp 97.1°F | Resp 18 | Ht 65.0 in | Wt 171.3 lb

## 2012-05-18 DIAGNOSIS — C9000 Multiple myeloma not having achieved remission: Secondary | ICD-10-CM

## 2012-05-18 NOTE — Telephone Encounter (Signed)
appts made and printed for pt aom °

## 2012-05-18 NOTE — Progress Notes (Signed)
[facility] [phone]   [phone]  OFFICE PROGRESS NOTE  [name],[name], PA [address] [place]. [address] [place] [address]  DIAGNOSIS: Recurrent multiple myeloma initially diagnosed in October 2008.   PRIOR THERAPY:  1. Status post palliative radiotherapy to the lumbar spine between L3 and L5. The patient received a total dose of 3000 cGy in 10 fractions under the care of Dr. Mitzi Hansen between May 05, 2007 through May 18, 2007. 2. Status post 5 cycles of systemic chemotherapy with Revlimid and low-dose Decadron with good response to this treatment. 3. Status post autologous peripheral blood stem cell transplant at [facility] on Nov 20, 2007 under the care of Dr. Vicente Serene. 4. Status post treatment for disease recurrence with Velcade, Doxil and Decadron. Last dose given Nov 09, 2009. Discontinued secondary to intolerance but the patient had a good response to treatment at that time. 5. Status post palliative radiotherapy to the T2-T6 thoracic vertebrae completed 03/21/2011 under the care of Dr. Mitzi Hansen. 6. Systemic chemotherapy with Velcade 1.3 mg per meter squared given on days 1, 4, 8 and 11, and Doxil at 30 mg per meter squared given on day 4 in addition to Decadron status post 4 cycles, discontinued secondary to tolerance.   CURRENT THERAPY: Observation.   INTERVAL HISTORY: [name] 61 y.o. female returns to the clinic today for routine three-month followup visit accompanied by her son. The patient is feeling fine with no specific complaints except for occasional pain at the left shoulder area in addition to the pain at the lower back. She is currently on pain medication in the form of MS Contin 15 mg by mouth every 12 hours in addition to Vicodin on as-needed basis and Neurontin. She denied having any other significant complaints. She has no chest pain, shortness breath, cough or hemoptysis. She denied having any significant  weight loss or night sweats. She has repeat CBC, comprehensive metabolic panel, LDH and myeloma panel performed recently and she is here for evaluation and discussion of her lab results.  MEDICAL HISTORY: Past Medical History  Diagnosis Date  . Thyroid disease Hypothyroidism  . Hypercholesterolemia   . Compression fracture 04/08/2007    pathologic compression fracture  . Hypothyroidism   . FHx: chemotherapy     s/p 5 cycle revlimid/low dose decadron,s/p velcade,doxil,decadron,  . Hx of radiation therapy 05/05/07-05/18/07,& 03/05/11-03/21/11-    l3&l5 in 2008, t2-t6 03/2011  . GERD (gastroesophageal reflux disease)   . Insomnia     associated with steroids  . Nausea   . Constipation     takes oxycontin,vicodin  . Hx of radiation therapy 05/05/2007 to 05/18/2007    palliative, L3-5  . 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 Hx of radiation therapy 03/05/2011 to 03/21/2011    palliative T2-T6, c-spine  . 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  MEDICATIONS:  Current Outpatient Prescriptions  Medication Sig Dispense Refill  . ALPRAZolam (XANAX) 0.5 MG tablet Take 0.5 mg by mouth Twice daily as needed.      . calcium carbonate (OS-CAL - DOSED IN MG OF ELEMENTAL CALCIUM) 1250 MG tablet Take 1 tablet by mouth daily.      . cholecalciferol (VITAMIN D) 1000 UNITS tablet Take 1,000 Units by mouth daily.       Marland Kitchen ESZOPICLONE 3 MG tablet Take 3 mg by mouth at  bedtime.       . gabapentin (NEURONTIN) 100 MG capsule Take 100 mg by mouth 3 (three) times daily.       . hydrochlorothiazide (HYDRODIURIL) 25 MG tablet Take 25 mg by mouth Daily.      Marland Kitchen HYDROcodone-acetaminophen (VICODIN ES) 7.5-750 MG per tablet TAKE 1 TABLET EVERY 6 HOURS AS NEEDED  30 tablet  1  . levothyroxine (SYNTHROID, LEVOTHROID) 100 MCG tablet Take 50-100 mcg by mouth as directed. 1 tab x 3 days a week Monday,wednesday,friday 1/2 tab all others day      .  lidocaine-prilocaine (EMLA) cream Apply topically as needed.        . magnesium chloride (SLOW-MAG) 64 MG TBEC Take 1 tablet by mouth daily.        Marland Kitchen morphine (MS CONTIN) 15 MG 12 hr tablet Take 1 tablet (15 mg total) by mouth 2 (two) times daily.  60 tablet  0  . Multiple Vitamin (MULITIVITAMIN WITH MINERALS) TABS Take 1 tablet by mouth daily.      . ondansetron (ZOFRAN-ODT) 4 MG disintegrating tablet Take 4 mg by mouth Twice daily as needed.      . pantoprazole (PROTONIX) 40 MG tablet TAKE 1 TABLET BY MOUTH DAILY  30 tablet  2  . polyethylene glycol-electrolytes (NULYTELY/GOLYTELY) 420 G solution Take as directed pre procedure      . PRESCRIPTION MEDICATION [facility]/Dr. Malaky Tetrault: Meloma Salvage DVD IV q21d Cycle 3 12/10-12/21. Cycle 4 scheduled for 12/31. Chemo treatments every Monday and Thursday      . prochlorperazine (COMPAZINE) 10 MG tablet Take 10 mg by mouth every 6 (six) hours as needed. nAUSEA       No current facility-administered medications for this visit.   Facility-Administered Medications Ordered in Other Visits  Medication Dose Route Frequency Provider Last Rate Last Dose  . 0.9 %  sodium chloride infusion   Intravenous Once Si Gaul, MD      . ondansetron (ZOFRAN) IVPB 8 mg  8 mg Intravenous Once Si Gaul, MD      . sodium chloride 0.9 % injection 10 mL  10 mL Intracatheter PRN Si Gaul, MD        SURGICAL HISTORY:  Past Surgical History  Procedure Date  . Cholecystectomy   . Knee surgery   . Knee surgery   . Video bronchoscopy 07/30/2011    Procedure: VIDEO BRONCHOSCOPY WITHOUT FLUORO;  Surgeon: Barbaraann Share, MD;  Location: [facility];  Service: Cardiopulmonary;  Laterality: Bilateral;    REVIEW OF SYSTEMS:  A comprehensive review of systems was negative except for: Musculoskeletal: positive for back pain   PHYSICAL EXAMINATION: General appearance: alert, cooperative and no distress Head: Normocephalic, without obvious abnormality,  atraumatic Neck: no adenopathy Resp: clear to auscultation bilaterally Back: Mild tenderness at the left scapular area Cardio: regular rate and rhythm, S1, S2 normal, no murmur, click, rub or gallop GI: soft, non-tender; bowel sounds normal; no masses,  no organomegaly Extremities: extremities normal, atraumatic, no cyanosis or edema  ECOG PERFORMANCE STATUS: 1 - Symptomatic but completely ambulatory  Blood pressure 105/62, pulse 66, temperature 97.1 F (36.2 C), temperature source Oral, resp. rate 18, height 5\' 5"  (1.651 m), weight 171 lb 4.8 oz (77.701 kg).  LABORATORY DATA: Lab Results  Component Value Date   WBC 8.4 05/12/2012   HGB 11.5* 05/12/2012   HCT 32.9* 05/12/2012   MCV 97.2 05/12/2012   PLT 190 05/12/2012      Chemistry  Component Value Date/Time   NA 139 05/12/2012 1532   NA 139 01/28/2012 1016   K 3.8 05/12/2012 1532   K 3.8 01/28/2012 1016   CL 108* 05/12/2012 1532   CL 106 01/28/2012 1016   CO2 25 05/12/2012 1532   CO2 26 01/28/2012 1016   BUN 14.0 05/12/2012 1532   BUN 15 01/28/2012 1016   CREATININE 0.8 05/12/2012 1532   CREATININE 0.91 01/28/2012 1016      Component Value Date/Time   CALCIUM 9.6 05/12/2012 1532   CALCIUM 10.0 01/28/2012 1016   ALKPHOS 106 05/12/2012 1532   ALKPHOS 64 01/28/2012 1016   AST 21 05/12/2012 1532   AST 16 01/28/2012 1016   ALT 17 05/12/2012 1532   ALT 13 01/28/2012 1016   BILITOT 0.59 05/12/2012 1532   BILITOT 0.9 01/28/2012 1016     Myeloma panel: Beta-2 microglobulin 1.92, free kappa light chain 1.91, free lambda light chain 5.62, kappa/lambda ratio 0.34. IgG 1310, IgA 243 and IgM 35.  RADIOGRAPHIC STUDIES: No results found.  ASSESSMENT: This is a very pleasant 61 years old [place] American female with history of multiple myeloma status post several chemotherapy regimen and currently on observation. The patient is doing fine and she has no evidence for disease progression on the recent myeloma panel.  PLAN: I  discussed the lab result with the patient and her son. I recommended for her to continue on observation for now with repeat CBC, comprehensive metabolic panel and myeloma panel in 3 months. The patient would come back for followup visit at that time. For pain management, she will continue on MS Contin, Neurontin and Vicodin on as needed basis. She was advised to call me immediately if she has any concerning symptoms in the interval.  All questions were answered. The patient knows to call the clinic with any problems, questions or concerns. We can certainly see the patient much sooner if necessary.

## 2012-05-18 NOTE — Patient Instructions (Signed)
Your myeloma panel showed no evidence for disease progression. Followup in 3 months with repeat myeloma panel

## 2012-05-19 ENCOUNTER — Ambulatory Visit
Admission: RE | Admit: 2012-05-19 | Discharge: 2012-05-19 | Disposition: A | Discharge status: Home / Self Care | Payer: Medicare HMO | Source: Ambulatory Visit | Attending: Gastroenterology | Admitting: Gastroenterology

## 2012-05-19 DIAGNOSIS — R109 Unspecified abdominal pain: Secondary | ICD-10-CM

## 2012-05-19 MED ORDER — IOHEXOL 300 MG/ML  SOLN
100.0000 mL | Freq: Once | INTRAMUSCULAR | Status: AC | PRN
  Administered 2012-05-19: 100 mL via INTRAVENOUS

## 2012-05-21 ENCOUNTER — Telehealth: Payer: Self-pay | Admitting: *Deleted

## 2012-05-21 NOTE — Telephone Encounter (Signed)
Colonoscopy report given to Dr Donnald Garre to review.  SLJ

## 2012-05-31 ENCOUNTER — Emergency Department (HOSPITAL_COMMUNITY)
Admission: EM | Admit: 2012-05-31 | Discharge: 2012-05-31 | Disposition: A | Discharge status: Home / Self Care | Payer: Medicare HMO | Attending: Emergency Medicine | Admitting: Emergency Medicine

## 2012-05-31 ENCOUNTER — Encounter (HOSPITAL_COMMUNITY): Payer: Self-pay | Admitting: Emergency Medicine

## 2012-05-31 ENCOUNTER — Emergency Department (HOSPITAL_COMMUNITY): Payer: Medicare HMO

## 2012-05-31 DIAGNOSIS — Z9221 Personal history of antineoplastic chemotherapy: Secondary | ICD-10-CM | POA: Insufficient documentation

## 2012-05-31 DIAGNOSIS — Z79899 Other long term (current) drug therapy: Secondary | ICD-10-CM | POA: Insufficient documentation

## 2012-05-31 DIAGNOSIS — Z87891 Personal history of nicotine dependence: Secondary | ICD-10-CM | POA: Insufficient documentation

## 2012-05-31 DIAGNOSIS — Z8589 Personal history of malignant neoplasm of other organs and systems: Secondary | ICD-10-CM | POA: Insufficient documentation

## 2012-05-31 DIAGNOSIS — Z9889 Other specified postprocedural states: Secondary | ICD-10-CM | POA: Insufficient documentation

## 2012-05-31 DIAGNOSIS — G4700 Insomnia, unspecified: Secondary | ICD-10-CM | POA: Insufficient documentation

## 2012-05-31 DIAGNOSIS — K219 Gastro-esophageal reflux disease without esophagitis: Secondary | ICD-10-CM | POA: Insufficient documentation

## 2012-05-31 DIAGNOSIS — R0600 Dyspnea, unspecified: Secondary | ICD-10-CM

## 2012-05-31 DIAGNOSIS — R059 Cough, unspecified: Secondary | ICD-10-CM | POA: Insufficient documentation

## 2012-05-31 DIAGNOSIS — E039 Hypothyroidism, unspecified: Secondary | ICD-10-CM | POA: Insufficient documentation

## 2012-05-31 DIAGNOSIS — E079 Disorder of thyroid, unspecified: Secondary | ICD-10-CM | POA: Insufficient documentation

## 2012-05-31 DIAGNOSIS — R0989 Other specified symptoms and signs involving the circulatory and respiratory systems: Secondary | ICD-10-CM | POA: Insufficient documentation

## 2012-05-31 DIAGNOSIS — E7800 Pure hypercholesterolemia, unspecified: Secondary | ICD-10-CM | POA: Insufficient documentation

## 2012-05-31 DIAGNOSIS — Z8781 Personal history of (healed) traumatic fracture: Secondary | ICD-10-CM | POA: Insufficient documentation

## 2012-05-31 DIAGNOSIS — Z8719 Personal history of other diseases of the digestive system: Secondary | ICD-10-CM | POA: Insufficient documentation

## 2012-05-31 DIAGNOSIS — R05 Cough: Secondary | ICD-10-CM | POA: Insufficient documentation

## 2012-05-31 DIAGNOSIS — R0609 Other forms of dyspnea: Secondary | ICD-10-CM | POA: Insufficient documentation

## 2012-05-31 DIAGNOSIS — R197 Diarrhea, unspecified: Secondary | ICD-10-CM | POA: Insufficient documentation

## 2012-05-31 DIAGNOSIS — R509 Fever, unspecified: Secondary | ICD-10-CM | POA: Insufficient documentation

## 2012-05-31 LAB — COMPREHENSIVE METABOLIC PANEL
Albumin: 3.5 g/dL (ref 3.5–5.2)
Alkaline Phosphatase: 87 U/L (ref 39–117)
BUN: 13 mg/dL (ref 6–23)
Potassium: 3.4 mEq/L — ABNORMAL LOW (ref 3.5–5.1)
Sodium: 139 mEq/L (ref 135–145)
Total Protein: 6.9 g/dL (ref 6.0–8.3)

## 2012-05-31 LAB — CBC WITH DIFFERENTIAL/PLATELET
Basophils Absolute: 0 10*3/uL (ref 0.0–0.1)
Basophils Relative: 0 % (ref 0–1)
Eosinophils Absolute: 0 10*3/uL (ref 0.0–0.7)
MCH: 32.8 pg (ref 26.0–34.0)
MCHC: 35.2 g/dL (ref 30.0–36.0)
Monocytes Relative: 6 % (ref 3–12)
Neutrophils Relative %: 54 % (ref 43–77)
Platelets: 233 10*3/uL (ref 150–400)
RDW: 12.8 % (ref 11.5–15.5)

## 2012-05-31 LAB — URINALYSIS, ROUTINE W REFLEX MICROSCOPIC
Glucose, UA: NEGATIVE mg/dL
Ketones, ur: NEGATIVE mg/dL
Leukocytes, UA: NEGATIVE
pH: 5 (ref 5.0–8.0)

## 2012-05-31 MED ORDER — ONDANSETRON HCL 4 MG/2ML IJ SOLN
4.0000 mg | Freq: Once | INTRAMUSCULAR | Status: AC
  Administered 2012-05-31: 4 mg via INTRAVENOUS
  Filled 2012-05-31: qty 2

## 2012-05-31 MED ORDER — IOHEXOL 300 MG/ML  SOLN
100.0000 mL | Freq: Once | INTRAMUSCULAR | Status: AC | PRN
  Administered 2012-05-31: 100 mL via INTRAVENOUS

## 2012-05-31 MED ORDER — IOHEXOL 350 MG/ML SOLN
100.0000 mL | Freq: Once | INTRAVENOUS | Status: AC | PRN
  Administered 2012-05-31: 100 mL via INTRAVENOUS

## 2012-05-31 MED ORDER — HEPARIN SOD (PORK) LOCK FLUSH 100 UNIT/ML IV SOLN
INTRAVENOUS | Status: AC
  Administered 2012-05-31: 20:00:00
  Filled 2012-05-31: qty 5

## 2012-05-31 MED ORDER — AZITHROMYCIN 250 MG PO TABS: ORAL_TABLET | ORAL | Status: DC

## 2012-05-31 MED ORDER — SODIUM CHLORIDE 0.9 % IV SOLN
INTRAVENOUS | Status: DC
  Administered 2012-05-31: 17:00:00 via INTRAVENOUS

## 2012-05-31 NOTE — ED Notes (Addendum)
Pt presents w/ emesis and diarrhea on and off for 1 week. Now w/ joint pain, fever off and on, general malaise, lack of appetite. Pt states when she eats if she does not have emesis post meal she has diarrhea. Shortness of breath onset Friday and has worsened since.

## 2012-05-31 NOTE — ED Provider Notes (Signed)
History     CSN: [id number]  Arrival date & time 05/31/12  1405   First MD Initiated Contact with Patient 05/31/12 1503      Chief Complaint  Patient presents with  . Shortness of Breath    (Consider location/radiation/quality/duration/timing/severity/associated sxs/prior treatment) Patient is a 61 y.o. female presenting with shortness of breath. The history is provided by the patient.  Shortness of Breath  Associated symptoms include shortness of breath.   patient here complaining of shortness of breath x3 days with some associated fever and cough. Temperature at home was 102 according to her. No medications were taken for this. Cough has been nonproductive. She notes some nonbilious vomiting. Denies any urinary symptoms. Does have a history of multiple myeloma and last treatment was earlier this year. Diarrhea has been watery. She has not vomited today. No treatment used prior to arrival and nothing makes her symptoms better or worse  Past Medical History  Diagnosis Date  . Thyroid disease Hypothyroidism  . Hypercholesterolemia   . Compression fracture 04/08/2007    pathologic compression fracture  . Hypothyroidism   . FHx: chemotherapy     s/p 5 cycle revlimid/low dose decadron,s/p velcade,doxil,decadron,  . Hx of radiation therapy 05/05/07-05/18/07,& 03/05/11-03/21/11-    l3&l5 in 2008, t2-t6 03/2011  . GERD (gastroesophageal reflux disease)   . Insomnia     associated with steroids  . Nausea   . Constipation     takes oxycontin,vicodin  . Hx of radiation therapy 05/05/2007 to 05/18/2007    palliative, L3-5  . 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 Hx of radiation therapy 03/05/2011 to 03/21/2011    palliative T2-T6, c-spine  . History of autologous stem cell transplant 11/20/2007    [facility], Dr Vicente Serene    Past Surgical History  Procedure Date  . Cholecystectomy   . Knee surgery   . Knee surgery   . Video bronchoscopy 07/30/2011    Procedure: VIDEO BRONCHOSCOPY WITHOUT  FLUORO;  Surgeon: Barbaraann Share, MD;  Location: [facility];  Service: Cardiopulmonary;  Laterality: Bilateral;    Family History  Problem Relation Age of Onset  . Cancer Brother     lung ca  . Cancer Maternal Uncle     colon  . Cancer Maternal Grandmother     breast ca    History  Substance Use Topics  . Smoking status: Former Smoker -- 0.2 packs/day for 6 years    Quit date: 08/27/1978  . Smokeless tobacco: Never Used  . Alcohol Use: No    OB History    Grav Para Term Preterm Abortions TAB SAB Ect Mult Living                  Review of Systems  Respiratory: Positive for shortness of breath.   All other systems reviewed and are negative.    Allergies  Review of patient's allergies indicates no known allergies.  Home Medications   Current Outpatient Rx  Name  Route  Sig  Dispense  Refill  . ALPRAZOLAM 0.5 MG PO TABS   Oral   Take 0.5 mg by mouth Twice daily as needed.         Marland Kitchen VITAMIN D 1000 UNITS PO TABS   Oral   Take 1,000 Units by mouth daily.          Alfonso Patten 3 MG PO TABS   Oral   Take 3 mg by mouth at bedtime.          Marland Kitchen  GABAPENTIN 100 MG PO CAPS   Oral   Take 100 mg by mouth 3 (three) times daily.          . GUAIFENESIN ER 600 MG PO TB12   Oral   Take 600 mg by mouth 2 (two) times daily.         Marland Kitchen HYDROCHLOROTHIAZIDE 25 MG PO TABS   Oral   Take 25 mg by mouth Daily.         Marland Kitchen HYDROCODONE-ACETAMINOPHEN 7.5-750 MG PO TABS   Oral   Take 1 tablet by mouth every 6 (six) hours as needed. For pain         . LEVOTHYROXINE SODIUM 100 MCG PO TABS   Oral   Take 100 mcg by mouth daily.          Marland Kitchen LIDOCAINE-PRILOCAINE 2.5-2.5 % EX CREA   Topical   Apply 1 application topically daily as needed. For port         . MAGNESIUM CHLORIDE 64 MG PO TBEC   Oral   Take 1 tablet by mouth daily.           . MORPHINE SULFATE ER 15 MG PO TBCR   Oral   Take 1 tablet (15 mg total) by mouth 2 (two) times daily.   60 tablet   0   .  ADULT MULTIVITAMIN W/MINERALS CH   Oral   Take 1 tablet by mouth daily.         Marland Kitchen ONDANSETRON 4 MG PO TBDP   Oral   Take 4 mg by mouth 2 (two) times daily as needed.          Marland Kitchen PANTOPRAZOLE SODIUM 40 MG PO TBEC   Oral   Take 40 mg by mouth daily.         Marland Kitchen PRESCRIPTION MEDICATION      [facility]/Dr. Mohamed: Meloma Salvage DVD IV q21d Cycle 3 12/10-12/21. Cycle 4 scheduled for 12/31. Chemo treatments every Monday and Thursday         . PROCHLORPERAZINE MALEATE 10 MG PO TABS   Oral   Take 10 mg by mouth every 6 (six) hours as needed. nAUSEA           BP 111/68  Pulse 82  Temp 98.7 F (37.1 C) (Oral)  Resp 18  SpO2 100%  Physical Exam  Nursing note and vitals reviewed. Constitutional: She is oriented to person, place, and time. She appears well-developed and well-nourished.  Non-toxic appearance. No distress.  HENT:  Head: Normocephalic and atraumatic.  Eyes: Conjunctivae normal, EOM and lids are normal. Pupils are equal, round, and reactive to light.  Neck: Normal range of motion. Neck supple. No tracheal deviation present. No mass present.  Cardiovascular: Normal rate, regular rhythm and normal heart sounds.  Exam reveals no gallop.   No murmur heard. Pulmonary/Chest: Effort normal and breath sounds normal. No stridor. No respiratory distress. She has no decreased breath sounds. She has no wheezes. She has no rhonchi. She has no rales.  Abdominal: Soft. Normal appearance and bowel sounds are normal. She exhibits no distension. There is no tenderness. There is no rebound and no CVA tenderness.  Musculoskeletal: Normal range of motion. She exhibits no edema and no tenderness.  Neurological: She is alert and oriented to person, place, and time. She has normal strength. No cranial nerve deficit or sensory deficit. GCS eye subscore is 4. GCS verbal subscore is 5. GCS motor subscore is 6.  Skin: Skin is  warm and dry. No abrasion and no rash noted.  Psychiatric: She has a  normal mood and affect. Her speech is normal and behavior is normal.    ED Course  Procedures (including critical care time)   Labs Reviewed  CBC WITH DIFFERENTIAL  COMPREHENSIVE METABOLIC PANEL  URINALYSIS, ROUTINE W REFLEX MICROSCOPIC  URINE CULTURE  PRO B NATRIURETIC PEPTIDE   No results found.   No diagnosis found.    MDM  Patient notes upper throat congestion and states that she has trouble breathing and that she does clear her throat she was better. Her daughter 2 weeks ago for similar symptoms and was prescribed Mucinex. Blood work x-rays are noted. No acute findings. Patient possibly has bacterial infection in her trachea and we'll treat with antibiotics        Toy Baker, MD 05/31/12 1955

## 2012-06-01 LAB — URINE CULTURE: Colony Count: 15000

## 2012-06-03 ENCOUNTER — Other Ambulatory Visit: Payer: Self-pay | Admitting: Medical Oncology

## 2012-06-03 DIAGNOSIS — R52 Pain, unspecified: Secondary | ICD-10-CM

## 2012-06-03 MED ORDER — HYDROCODONE-ACETAMINOPHEN 7.5-750 MG PO TABS: 1.0000 | ORAL_TABLET | Freq: Four times a day (QID) | ORAL | Status: DC | PRN

## 2012-06-03 NOTE — Telephone Encounter (Signed)
called in refill and pt notified

## 2012-06-04 ENCOUNTER — Encounter: Payer: Self-pay | Admitting: Internal Medicine

## 2012-06-05 ENCOUNTER — Other Ambulatory Visit: Payer: Self-pay | Admitting: *Deleted

## 2012-06-05 DIAGNOSIS — C9000 Multiple myeloma not having achieved remission: Secondary | ICD-10-CM

## 2012-06-05 MED ORDER — MORPHINE SULFATE ER 15 MG PO TBCR: 15.0000 mg | EXTENDED_RELEASE_TABLET | Freq: Two times a day (BID) | ORAL | Status: DC

## 2012-06-06 LAB — CULTURE, BLOOD (ROUTINE X 2)

## 2012-06-07 LAB — CULTURE, BLOOD (ROUTINE X 2): Culture: NO GROWTH

## 2012-06-22 ENCOUNTER — Ambulatory Visit (HOSPITAL_BASED_OUTPATIENT_CLINIC_OR_DEPARTMENT_OTHER): Payer: Medicare HMO

## 2012-06-22 VITALS — BP 115/59 | HR 55 | Temp 98.7°F

## 2012-06-22 DIAGNOSIS — C9000 Multiple myeloma not having achieved remission: Secondary | ICD-10-CM

## 2012-06-22 DIAGNOSIS — Z452 Encounter for adjustment and management of vascular access device: Secondary | ICD-10-CM

## 2012-06-22 MED ORDER — SODIUM CHLORIDE 0.9 % IJ SOLN
10.0000 mL | INTRAMUSCULAR | Status: DC | PRN
  Administered 2012-06-22: 10 mL via INTRAVENOUS
  Filled 2012-06-22: qty 10

## 2012-06-22 MED ORDER — HEPARIN SOD (PORK) LOCK FLUSH 100 UNIT/ML IV SOLN
500.0000 [IU] | Freq: Once | INTRAVENOUS | Status: AC
  Administered 2012-06-22: 500 [IU] via INTRAVENOUS
  Filled 2012-06-22: qty 5

## 2012-06-22 NOTE — Patient Instructions (Signed)
Call MD for problems 

## 2012-07-01 DIAGNOSIS — I639 Cerebral infarction, unspecified: Secondary | ICD-10-CM

## 2012-07-01 HISTORY — DX: Cerebral infarction, unspecified: I63.9

## 2012-07-02 ENCOUNTER — Emergency Department (HOSPITAL_COMMUNITY): Payer: Medicare HMO

## 2012-07-02 ENCOUNTER — Emergency Department (HOSPITAL_COMMUNITY)
Admission: EM | Admit: 2012-07-02 | Discharge: 2012-07-02 | Disposition: A | Discharge status: Home / Self Care | Payer: Medicare HMO | Attending: Emergency Medicine | Admitting: Emergency Medicine

## 2012-07-02 ENCOUNTER — Encounter (HOSPITAL_COMMUNITY): Payer: Self-pay | Admitting: Emergency Medicine

## 2012-07-02 DIAGNOSIS — K219 Gastro-esophageal reflux disease without esophagitis: Secondary | ICD-10-CM | POA: Insufficient documentation

## 2012-07-02 DIAGNOSIS — Z85828 Personal history of other malignant neoplasm of skin: Secondary | ICD-10-CM | POA: Insufficient documentation

## 2012-07-02 DIAGNOSIS — Z8781 Personal history of (healed) traumatic fracture: Secondary | ICD-10-CM | POA: Insufficient documentation

## 2012-07-02 DIAGNOSIS — Z8669 Personal history of other diseases of the nervous system and sense organs: Secondary | ICD-10-CM | POA: Insufficient documentation

## 2012-07-02 DIAGNOSIS — S46909A Unspecified injury of unspecified muscle, fascia and tendon at shoulder and upper arm level, unspecified arm, initial encounter: Secondary | ICD-10-CM | POA: Insufficient documentation

## 2012-07-02 DIAGNOSIS — Z9484 Stem cells transplant status: Secondary | ICD-10-CM | POA: Insufficient documentation

## 2012-07-02 DIAGNOSIS — Z923 Personal history of irradiation: Secondary | ICD-10-CM | POA: Insufficient documentation

## 2012-07-02 DIAGNOSIS — Z9221 Personal history of antineoplastic chemotherapy: Secondary | ICD-10-CM | POA: Insufficient documentation

## 2012-07-02 DIAGNOSIS — S6980XA Other specified injuries of unspecified wrist, hand and finger(s), initial encounter: Secondary | ICD-10-CM | POA: Insufficient documentation

## 2012-07-02 DIAGNOSIS — Y92009 Unspecified place in unspecified non-institutional (private) residence as the place of occurrence of the external cause: Secondary | ICD-10-CM | POA: Insufficient documentation

## 2012-07-02 DIAGNOSIS — IMO0002 Reserved for concepts with insufficient information to code with codable children: Secondary | ICD-10-CM | POA: Insufficient documentation

## 2012-07-02 DIAGNOSIS — Z87891 Personal history of nicotine dependence: Secondary | ICD-10-CM | POA: Insufficient documentation

## 2012-07-02 DIAGNOSIS — T07XXXA Unspecified multiple injuries, initial encounter: Secondary | ICD-10-CM | POA: Insufficient documentation

## 2012-07-02 DIAGNOSIS — S4980XA Other specified injuries of shoulder and upper arm, unspecified arm, initial encounter: Secondary | ICD-10-CM | POA: Insufficient documentation

## 2012-07-02 DIAGNOSIS — W108XXA Fall (on) (from) other stairs and steps, initial encounter: Secondary | ICD-10-CM | POA: Insufficient documentation

## 2012-07-02 DIAGNOSIS — Y939 Activity, unspecified: Secondary | ICD-10-CM | POA: Insufficient documentation

## 2012-07-02 DIAGNOSIS — E039 Hypothyroidism, unspecified: Secondary | ICD-10-CM | POA: Insufficient documentation

## 2012-07-02 DIAGNOSIS — S6990XA Unspecified injury of unspecified wrist, hand and finger(s), initial encounter: Secondary | ICD-10-CM | POA: Insufficient documentation

## 2012-07-02 DIAGNOSIS — Z79899 Other long term (current) drug therapy: Secondary | ICD-10-CM | POA: Insufficient documentation

## 2012-07-02 DIAGNOSIS — E7800 Pure hypercholesterolemia, unspecified: Secondary | ICD-10-CM | POA: Insufficient documentation

## 2012-07-02 NOTE — ED Notes (Signed)
Pt states she fell down 14 steps inside home last night. Pt c/o bilat arm/wrist pain. Pt also c/o neck stiffness. R leg and R foot/toe pain. Pt also c/o hematoma to R forehead. Denies LOC. Pt currently being tx for Multiple Myeloma

## 2012-07-02 NOTE — ED Provider Notes (Signed)
History     CSN: [id number]  Arrival date & time 07/02/12  0244   First MD Initiated Contact with Patient 07/02/12 [phone]      Chief Complaint  Patient presents with  . Fall    (Consider location/radiation/quality/duration/timing/severity/associated sxs/prior treatment) HPI This is a 62 year old female with a history of multiple myeloma. She is status post autologous stem cell transplant. She fell down the stairs yesterday he evening, falling about 14 steps. There was no loss of consciousness. She has subsequently developed moderate to severe pain in her neck, upper back, left shoulder and right thumb. She has lesser pain in other locations on her body. The pain is worse with movement or palpation. She has some stiffness in her neck. She states she is otherwise feeling well and is no longer on any chemotherapeutic agents.  Past Medical History  Diagnosis Date  . Thyroid disease Hypothyroidism  . Hypercholesterolemia   . Compression fracture 04/08/2007    pathologic compression fracture  . Hypothyroidism   . FHx: chemotherapy     s/p 5 cycle revlimid/low dose decadron,s/p velcade,doxil,decadron,  . Hx of radiation therapy 05/05/07-05/18/07,& 03/05/11-03/21/11-    l3&l5 in 2008, t2-t6 03/2011  . GERD (gastroesophageal reflux disease)   . Insomnia     associated with steroids  . Nausea   . Constipation     takes oxycontin,vicodin  . Hx of radiation therapy 05/05/2007 to 05/18/2007    palliative, L3-5  . 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 Hx of radiation therapy 03/05/2011 to 03/21/2011    palliative T2-T6, c-spine  . History of autologous stem cell transplant 11/20/2007    [facility], Dr Vicente Serene    Past Surgical History  Procedure Date  . Cholecystectomy   . Knee surgery   . Knee surgery   . Video bronchoscopy 07/30/2011    Procedure: VIDEO BRONCHOSCOPY WITHOUT FLUORO;  Surgeon: Barbaraann Share, MD;  Location: [facility];  Service: Cardiopulmonary;  Laterality: Bilateral;    Family  History  Problem Relation Age of Onset  . Cancer Brother     lung ca  . Cancer Maternal Uncle     colon  . Cancer Maternal Grandmother     breast ca    History  Substance Use Topics  . Smoking status: Former Smoker -- 0.2 packs/day for 6 years    Quit date: 08/27/1978  . Smokeless tobacco: Never Used  . Alcohol Use: No    OB History    Grav Para Term Preterm Abortions TAB SAB Ect Mult Living                  Review of Systems  All other systems reviewed and are negative.    Allergies  Review of patient's allergies indicates no known allergies.  Home Medications   Current Outpatient Rx  Name  Route  Sig  Dispense  Refill  . ALPRAZOLAM 0.5 MG PO TABS   Oral   Take 0.5 mg by mouth Twice daily as needed.         Marland Kitchen VITAMIN D 1000 UNITS PO TABS   Oral   Take 1,000 Units by mouth daily.          Marland Kitchen HYDROCHLOROTHIAZIDE 25 MG PO TABS   Oral   Take 25 mg by mouth Daily.         Marland Kitchen HYDROCODONE-ACETAMINOPHEN 7.5-750 MG PO TABS   Oral   Take 1 tablet by mouth every 6 (six) hours as needed. For  pain   30 tablet   0   . LEVOTHYROXINE SODIUM 100 MCG PO TABS   Oral   Take 100 mcg by mouth daily.          Marland Kitchen MAGNESIUM CHLORIDE 64 MG PO TBEC   Oral   Take 1 tablet by mouth daily.           . MORPHINE SULFATE ER 15 MG PO TBCR   Oral   Take 1 tablet (15 mg total) by mouth 2 (two) times daily.   60 tablet   0   . ADULT MULTIVITAMIN W/MINERALS CH   Oral   Take 1 tablet by mouth daily.         Marland Kitchen PANTOPRAZOLE SODIUM 40 MG PO TBEC   Oral   Take 40 mg by mouth daily.         Alfonso Patten 3 MG PO TABS   Oral   Take 3 mg by mouth at bedtime.          Marland Kitchen GABAPENTIN 100 MG PO CAPS   Oral   Take 100 mg by mouth 3 (three) times daily.          Marland Kitchen LIDOCAINE-PRILOCAINE 2.5-2.5 % EX CREA   Topical   Apply 1 application topically daily as needed. For port         . ONDANSETRON 4 MG PO TBDP   Oral   Take 4 mg by mouth 2 (two) times daily as needed.            Marland Kitchen PRESCRIPTION MEDICATION      [facility]/Dr. Mohamed: Meloma Salvage DVD IV q21d Cycle 3 12/10-12/21. Cycle 4 scheduled for 12/31. Chemo treatments every Monday and Thursday         . PROCHLORPERAZINE MALEATE 10 MG PO TABS   Oral   Take 10 mg by mouth every 6 (six) hours as needed. nAUSEA           BP 114/68  Pulse 69  Temp 97.8 F (36.6 C) (Oral)  Resp 15  Ht 5\' 6"  (1.676 m)  Wt 162 lb (73.483 kg)  BMI 26.15 kg/m2  SpO2 67%  Physical Exam General: Well-developed, well-nourished female in no acute distress; appearance consistent with age of record HENT: normocephalic, atraumatic Eyes: pupils equal round and reactive to light; extraocular muscles intact Neck: Soft tissue tenderness; examined after radiographic clearance Heart: regular rate and rhythm Lungs: clear to auscultation bilaterally Spine midthoracic spinal tenderness Abdomen: soft; nondistended; nontender; bowel sounds present Extremities:  full range of motion; tenderness of left shoulder, right thumb and right anatomic snuffbox without deformity or swelling Neurologic: Awake, alert and oriented; motor function intact in all extremities and symmetric; no facial droop Skin: Warm and dry Psychiatric: Normal mood and affect    ED Course  Procedures (including critical care time)    MDM  Nursing notes and vitals signs, including pulse oximetry, reviewed.  Summary of this visit's results, reviewed by myself:  Labs:  No results found for this or any previous visit (from the past 24 hour(s)).  Imaging Studies: Dg Thoracic Spine 2 View  07/02/2012  *RADIOLOGY REPORT*  Clinical Data: Status post fall; upper back pain.  THORACIC SPINE - 2 VIEW  Comparison: Chest radiograph and CTA of the chest performed 05/31/2012  Findings: There is no evidence of fracture or subluxation. Vertebral bodies demonstrate normal height and alignment. Intervertebral disc spaces are preserved.  The visualized portions of both lungs  are clear.  The  mediastinum is unremarkable in appearance.  A right-sided chest port is noted ending about the cavoatrial junction.  Scattered clips are seen overlying the right upper quadrant.  IMPRESSION: No evidence of fracture or subluxation along the thoracic spine.   Original Report Authenticated By: Tonia Ghent, M.D.    Dg Wrist Complete Right  07/02/2012  *RADIOLOGY REPORT*  Clinical Data: Status post fall down steps; right wrist pain.  RIGHT WRIST - COMPLETE 3+ VIEW  Comparison: None.  Findings: There is no evidence of fracture or dislocation.  The carpal rows are intact, and demonstrate normal alignment.  Minimal degenerative change is noted at the distal carpal row.  The joint spaces are preserved.  There is mild degenerative change at the first metacarpophalangeal joint, with slight chronic subluxation.  No significant soft tissue abnormalities are seen.  IMPRESSION: No evidence of fracture or dislocation.   Original Report Authenticated By: Tonia Ghent, M.D.    Ct Cervical Spine Wo Contrast  07/02/2012  *RADIOLOGY REPORT*  Clinical Data: Status post fall down 14 steps; neck stiffness. History of multiple myeloma.  CT CERVICAL SPINE WITHOUT CONTRAST  Technique:  Multidetector CT imaging of the cervical spine was performed. Multiplanar CT image reconstructions were also generated.  Comparison: CT of the cervical spine performed 04/11/2009  Findings: There is no evidence of fracture or subluxation. Vertebral bodies demonstrate normal height and alignment.  Mild multilevel disc space narrowing is noted along the lower cervical spine, at C5-C6 and C6-C7, with associated anterior and posterior disc osteophyte complexes.  Prevertebral soft tissues are within normal limits.  Multiple lucent lesions are noted throughout the cervical and upper thoracic spine, compatible with the patient's history of multiple myeloma.  The thyroid gland is unremarkable in appearance.  The visualized lung apices are clear.   No significant soft tissue abnormalities are seen.  IMPRESSION:  1.  No evidence of fracture or subluxation along the cervical spine. 2.  Mild degenerative change noted along the lower cervical spine. 3.  Multiple lucent lesions throughout the cervical and upper thoracic spine, compatible with the patient's history of multiple myeloma.   Original Report Authenticated By: Tonia Ghent, M.D.    Dg Shoulder Left  07/02/2012  *RADIOLOGY REPORT*  Clinical Data: Status post fall; soreness and generalized left shoulder pain.  LEFT SHOULDER - 2+ VIEW  Comparison: Chest radiograph performed 05/31/2012  Findings: There is no evidence of fracture or dislocation.  The left humeral head is seated within the glenoid fossa.  The acromioclavicular joint is unremarkable in appearance.  No significant soft tissue abnormalities are seen.  The visualized portions of the left lung are clear.  IMPRESSION: No evidence of fracture or dislocation.   Original Report Authenticated By: Tonia Ghent, M.D.    6:35 AM The patient states she has adequate analgesics at home.           Hanley Seamen, MD 07/02/12 [phone]

## 2012-07-06 ENCOUNTER — Other Ambulatory Visit: Payer: Self-pay | Admitting: Medical Oncology

## 2012-07-06 ENCOUNTER — Other Ambulatory Visit: Payer: Self-pay | Admitting: Internal Medicine

## 2012-07-06 DIAGNOSIS — C9000 Multiple myeloma not having achieved remission: Secondary | ICD-10-CM

## 2012-07-06 MED ORDER — MORPHINE SULFATE ER 15 MG PO TBCR: 15.0000 mg | EXTENDED_RELEASE_TABLET | Freq: Two times a day (BID) | ORAL | Status: DC

## 2012-07-06 NOTE — Telephone Encounter (Signed)
Rx locked in injection room for pt to pickup

## 2012-07-08 ENCOUNTER — Ambulatory Visit (HOSPITAL_COMMUNITY)
Admission: RE | Admit: 2012-07-08 | Discharge: 2012-07-08 | Disposition: A | Discharge status: Home / Self Care | Payer: Medicare HMO | Source: Ambulatory Visit | Attending: Internal Medicine | Admitting: Internal Medicine

## 2012-07-08 ENCOUNTER — Other Ambulatory Visit (HOSPITAL_COMMUNITY): Payer: Self-pay | Admitting: Internal Medicine

## 2012-07-08 DIAGNOSIS — S63509A Unspecified sprain of unspecified wrist, initial encounter: Secondary | ICD-10-CM | POA: Insufficient documentation

## 2012-07-08 DIAGNOSIS — S63501A Unspecified sprain of right wrist, initial encounter: Secondary | ICD-10-CM

## 2012-07-08 DIAGNOSIS — W19XXXA Unspecified fall, initial encounter: Secondary | ICD-10-CM | POA: Insufficient documentation

## 2012-07-27 ENCOUNTER — Other Ambulatory Visit: Payer: Self-pay | Admitting: Internal Medicine

## 2012-07-31 ENCOUNTER — Encounter: Payer: Self-pay | Admitting: Internal Medicine

## 2012-07-31 NOTE — Progress Notes (Signed)
Patient came and inquired about why she received a bill in the mail. I advised her to call the billing department and make sure both medicare and medicaid was billed. She thought she had financial assistance. There are no notes of her applying. I advised her it maybe she has a deductible because she mentioned about having to get bills over 3000.00. I told her if that is the case she can get copies from the cashier at [employer] [place] is a [employer] for the [employer].

## 2012-08-05 ENCOUNTER — Other Ambulatory Visit: Payer: Self-pay | Admitting: *Deleted

## 2012-08-05 DIAGNOSIS — C9000 Multiple myeloma not having achieved remission: Secondary | ICD-10-CM

## 2012-08-05 MED ORDER — MORPHINE SULFATE ER 15 MG PO TBCR: 15.0000 mg | EXTENDED_RELEASE_TABLET | Freq: Two times a day (BID) | ORAL | Status: DC

## 2012-08-16 ENCOUNTER — Other Ambulatory Visit: Payer: Self-pay | Admitting: Internal Medicine

## 2012-08-17 ENCOUNTER — Ambulatory Visit: Payer: Medicare HMO

## 2012-08-17 ENCOUNTER — Other Ambulatory Visit (HOSPITAL_BASED_OUTPATIENT_CLINIC_OR_DEPARTMENT_OTHER): Payer: Medicare HMO | Admitting: Lab

## 2012-08-17 VITALS — BP 119/68 | HR 78 | Temp 98.9°F

## 2012-08-17 DIAGNOSIS — C9000 Multiple myeloma not having achieved remission: Secondary | ICD-10-CM

## 2012-08-17 DIAGNOSIS — C9001 Multiple myeloma in remission: Secondary | ICD-10-CM

## 2012-08-17 LAB — CBC WITH DIFFERENTIAL/PLATELET
Basophils Absolute: 0 10*3/uL (ref 0.0–0.1)
EOS%: 1.4 % (ref 0.0–7.0)
HCT: 32 % — ABNORMAL LOW (ref 34.8–46.6)
HGB: 11.3 g/dL — ABNORMAL LOW (ref 11.6–15.9)
MCH: 33.3 pg (ref 25.1–34.0)
MCV: 94.5 fL (ref 79.5–101.0)
MONO%: 8.9 % (ref 0.0–14.0)
NEUT%: 50.5 % (ref 38.4–76.8)

## 2012-08-17 LAB — COMPREHENSIVE METABOLIC PANEL (CC13)
AST: 16 U/L (ref 5–34)
Alkaline Phosphatase: 91 U/L (ref 40–150)
BUN: 15.3 mg/dL (ref 7.0–26.0)
Calcium: 10.1 mg/dL (ref 8.4–10.4)
Chloride: 106 mEq/L (ref 98–107)
Creatinine: 1 mg/dL (ref 0.6–1.1)
Glucose: 107 mg/dl — ABNORMAL HIGH (ref 70–99)

## 2012-08-17 MED ORDER — SODIUM CHLORIDE 0.9 % IJ SOLN
10.0000 mL | INTRAMUSCULAR | Status: DC | PRN
  Administered 2012-08-17: 10 mL via INTRAVENOUS
  Filled 2012-08-17: qty 10

## 2012-08-17 MED ORDER — HEPARIN SOD (PORK) LOCK FLUSH 100 UNIT/ML IV SOLN
500.0000 [IU] | Freq: Once | INTRAVENOUS | Status: AC
  Administered 2012-08-17: 500 [IU] via INTRAVENOUS
  Filled 2012-08-17: qty 5

## 2012-08-19 ENCOUNTER — Telehealth: Payer: Self-pay | Admitting: Internal Medicine

## 2012-08-19 ENCOUNTER — Ambulatory Visit (HOSPITAL_BASED_OUTPATIENT_CLINIC_OR_DEPARTMENT_OTHER): Payer: Medicare HMO | Admitting: Internal Medicine

## 2012-08-19 ENCOUNTER — Encounter: Payer: Self-pay | Admitting: Internal Medicine

## 2012-08-19 ENCOUNTER — Other Ambulatory Visit: Payer: Self-pay | Admitting: Medical Oncology

## 2012-08-19 VITALS — BP 114/66 | HR 74 | Temp 98.6°F | Resp 20 | Ht 66.0 in | Wt 176.1 lb

## 2012-08-19 DIAGNOSIS — C9000 Multiple myeloma not having achieved remission: Secondary | ICD-10-CM

## 2012-08-19 LAB — BETA 2 MICROGLOBULIN, SERUM: Beta-2 Microglobulin: 1.98 mg/L — ABNORMAL HIGH (ref 1.01–1.73)

## 2012-08-19 LAB — KAPPA/LAMBDA LIGHT CHAINS: Lambda Free Lght Chn: 10.6 mg/dL — ABNORMAL HIGH (ref 0.57–2.63)

## 2012-08-19 LAB — IGG, IGA, IGM
IgA: 251 mg/dL (ref 69–380)
IgG (Immunoglobin G), Serum: 1470 mg/dL (ref 690–1700)

## 2012-08-19 MED ORDER — HYDROCODONE-ACETAMINOPHEN 7.5-325 MG PO TABS: 1.0000 | ORAL_TABLET | Freq: Four times a day (QID) | ORAL | Status: DC | PRN

## 2012-08-19 MED ORDER — HYDROCODONE-ACETAMINOPHEN 7.5-750 MG PO TABS: 1.0000 | ORAL_TABLET | Freq: Four times a day (QID) | ORAL | Status: DC | PRN

## 2012-08-19 NOTE — Telephone Encounter (Signed)
Called in vicodin refill

## 2012-08-19 NOTE — Addendum Note (Signed)
Addended by: Charma Igo on: 08/19/2012 10:45 AM   Modules accepted: Orders, Medications

## 2012-08-19 NOTE — Patient Instructions (Signed)
Your lab work showed no evidence for disease progression. Followup in 3 months with repeat myeloma panel

## 2012-08-19 NOTE — Progress Notes (Signed)
[facility] [phone]   [phone]  OFFICE PROGRESS NOTE  [name],[name], PA [address], [place]. [address] [place] [address]  DIAGNOSIS: Recurrent multiple myeloma initially diagnosed in October 2008.   PRIOR THERAPY:  1. Status post palliative radiotherapy to the lumbar spine between L3 and L5. The patient received a total dose of 3000 cGy in 10 fractions under the care of Dr. Mitzi Hansen between May 05, 2007 through May 18, 2007. 2. Status post 5 cycles of systemic chemotherapy with Revlimid and low-dose Decadron with good response to this treatment. 3. Status post autologous peripheral blood stem cell transplant at [facility] on Nov 20, 2007 under the care of Dr. Vicente Serene. 4. Status post treatment for disease recurrence with Velcade, Doxil and Decadron. Last dose given Nov 09, 2009. Discontinued secondary to intolerance but the patient had a good response to treatment at that time. 5. Status post palliative radiotherapy to the T2-T6 thoracic vertebrae completed 03/21/2011 under the care of Dr. Mitzi Hansen. 6. Systemic chemotherapy with Velcade 1.3 mg per meter squared given on days 1, 4, 8 and 11, and Doxil at 30 mg per meter squared given on day 4 in addition to Decadron status post 4 cycles, discontinued secondary to tolerance.   CURRENT THERAPY: Observation.   INTERVAL HISTORY: [name] 62 y.o. female returns to the clinic today for routine followup visit accompanied her daughter. The patient is feeling fine today with no specific complaints. She continues to have mild pain in the lower back and she is currently on pain medication in the form of MS Contin and Norco. She denied having any significant weight loss or night sweats. She denied having any chest pain, shortness breath, cough or hemoptysis. The patient had repeat myeloma panel performed recently and she is here for evaluation and discussion of her lab results.  MEDICAL  HISTORY: Past Medical History  Diagnosis Date  . Thyroid disease Hypothyroidism  . Hypercholesterolemia   . Compression fracture 04/08/2007    pathologic compression fracture  . Hypothyroidism   . FHx: chemotherapy     s/p 5 cycle revlimid/low dose decadron,s/p velcade,doxil,decadron,  . Hx of radiation therapy 05/05/07-05/18/07,& 03/05/11-03/21/11-    l3&l5 in 2008, t2-t6 03/2011  . GERD (gastroesophageal reflux disease)   . Insomnia     associated with steroids  . Nausea   . Constipation     takes oxycontin,vicodin  . Hx of radiation therapy 05/05/2007 to 05/18/2007    palliative, L3-5  . 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 Hx of radiation therapy 03/05/2011 to 03/21/2011    palliative T2-T6, c-spine  . History of autologous stem cell transplant 11/20/2007    [facility], Dr Vicente Serene    ALLERGIES:  has No Known Allergies.  MEDICATIONS:  Current Outpatient Prescriptions  Medication Sig Dispense Refill  . ALPRAZolam (XANAX) 0.5 MG tablet Take 0.5 mg by mouth Twice daily as needed.      . cholecalciferol (VITAMIN D) 1000 UNITS tablet Take 1,000 Units by mouth daily.       Marland Kitchen ESZOPICLONE 3 MG tablet Take 3 mg by mouth at bedtime. scheduled      . gabapentin (NEURONTIN) 100 MG capsule Take 100 mg by mouth 3 (three) times daily.       . hydrochlorothiazide (HYDRODIURIL) 25 MG tablet Take 25 mg by mouth Daily.      Marland Kitchen levothyroxine (SYNTHROID, LEVOTHROID) 100 MCG tablet Take 100 mcg by mouth daily.       Marland Kitchen  lidocaine-prilocaine (EMLA) cream APPLY TO PORT 1 TO 2 HRS PRIOR TO PROCEDURE  30 g  0  . morphine (MS CONTIN) 15 MG 12 hr tablet Take 1 tablet (15 mg total) by mouth 2 (two) times daily.  60 tablet  0  . Multiple Vitamin (MULITIVITAMIN WITH MINERALS) TABS Take 1 tablet by mouth daily.      . ondansetron (ZOFRAN-ODT) 4 MG disintegrating tablet Take 4 mg by mouth 2 (two) times daily as needed. For nausea      . pantoprazole (PROTONIX) 40 MG tablet Take 40 mg by mouth daily.       No current  facility-administered medications for this visit.   Facility-Administered Medications Ordered in Other Visits  Medication Dose Route Frequency Provider Last Rate Last Dose  . 0.9 %  sodium chloride infusion   Intravenous Once Si Gaul, MD      . ondansetron (ZOFRAN) IVPB 8 mg  8 mg Intravenous Once Si Gaul, MD      . sodium chloride 0.9 % injection 10 mL  10 mL Intracatheter PRN Si Gaul, MD        SURGICAL HISTORY:  Past Surgical History  Procedure Laterality Date  . Cholecystectomy    . Knee surgery    . Knee surgery    . Video bronchoscopy  07/30/2011    Procedure: VIDEO BRONCHOSCOPY WITHOUT FLUORO;  Surgeon: Barbaraann Share, MD;  Location: [facility];  Service: Cardiopulmonary;  Laterality: Bilateral;    REVIEW OF SYSTEMS:  A comprehensive review of systems was negative except for: Musculoskeletal: positive for back pain   PHYSICAL EXAMINATION: General appearance: alert, cooperative and no distress Head: Normocephalic, without obvious abnormality, atraumatic Neck: no adenopathy Resp: clear to auscultation bilaterally Cardio: regular rate and rhythm, S1, S2 normal, no murmur, click, rub or gallop GI: soft, non-tender; bowel sounds normal; no masses,  no organomegaly Extremities: extremities normal, atraumatic, no cyanosis or edema  ECOG PERFORMANCE STATUS: 1 - Symptomatic but completely ambulatory  Blood pressure 114/66, pulse 74, temperature 98.6 F (37 C), temperature source Oral, resp. rate 20, height 5\' 6"  (1.676 m), weight 176 lb 1.6 oz (79.878 kg).  LABORATORY DATA: Lab Results  Component Value Date   WBC 4.1 08/17/2012   HGB 11.3* 08/17/2012   HCT 32.0* 08/17/2012   MCV 94.5 08/17/2012   PLT 205 08/17/2012      Chemistry      Component Value Date/Time   NA 142 08/17/2012 1027   NA 139 05/31/2012 1600   K 3.8 08/17/2012 1027   K 3.4* 05/31/2012 1600   CL 106 08/17/2012 1027   CL 105 05/31/2012 1600   CO2 28 08/17/2012 1027   CO2 25 05/31/2012  1600   BUN 15.3 08/17/2012 1027   BUN 13 05/31/2012 1600   CREATININE 1.0 08/17/2012 1027   CREATININE 0.80 05/31/2012 1600      Component Value Date/Time   CALCIUM 10.1 08/17/2012 1027   CALCIUM 10.1 05/31/2012 1600   ALKPHOS 91 08/17/2012 1027   ALKPHOS 87 05/31/2012 1600   AST 16 08/17/2012 1027   AST 17 05/31/2012 1600   ALT 15 08/17/2012 1027   ALT 16 05/31/2012 1600   BILITOT 0.61 08/17/2012 1027   BILITOT 0.7 05/31/2012 1600     Other lab results: Beta-2 microglobulin 1.98, IgG 1470, IgA 251 and IgM 37.  RADIOGRAPHIC STUDIES: No results found.  ASSESSMENT: This is a very pleasant 62 years old [place] American female with history of multiple myeloma  status post several chemotherapy regimens as well as a stem cell transplant and currently on observation with no evidence for disease progression.   PLAN: I discussed the lab result with the patient and her daughter. I recommended for her to continue on observation for now with repeat CBC, comprehensive metabolic panel, LDH and myeloma panel in 3 months. For pain management she will continue on MS Contin and Norco. She was advised to call immediately if she has any concerning symptoms in the interval.  All questions were answered. The patient knows to call the clinic with any problems, questions or concerns. We can certainly see the patient much sooner if necessary.

## 2012-08-23 ENCOUNTER — Other Ambulatory Visit: Payer: Self-pay | Admitting: Internal Medicine

## 2012-09-09 ENCOUNTER — Other Ambulatory Visit: Payer: Self-pay | Admitting: Medical Oncology

## 2012-09-09 DIAGNOSIS — C9000 Multiple myeloma not having achieved remission: Secondary | ICD-10-CM

## 2012-09-09 MED ORDER — MORPHINE SULFATE ER 15 MG PO TBCR: 15.0000 mg | EXTENDED_RELEASE_TABLET | Freq: Two times a day (BID) | ORAL | Status: DC

## 2012-09-09 NOTE — Telephone Encounter (Signed)
Left message for pt to pick up refill tomorrow

## 2012-09-15 ENCOUNTER — Other Ambulatory Visit: Payer: Self-pay | Admitting: Internal Medicine

## 2012-09-15 DIAGNOSIS — C9000 Multiple myeloma not having achieved remission: Secondary | ICD-10-CM

## 2012-10-06 ENCOUNTER — Other Ambulatory Visit: Payer: Self-pay

## 2012-10-06 DIAGNOSIS — Z1231 Encounter for screening mammogram for malignant neoplasm of breast: Secondary | ICD-10-CM

## 2012-10-07 ENCOUNTER — Other Ambulatory Visit: Payer: Self-pay | Admitting: *Deleted

## 2012-10-07 ENCOUNTER — Ambulatory Visit (HOSPITAL_BASED_OUTPATIENT_CLINIC_OR_DEPARTMENT_OTHER): Payer: Medicare HMO

## 2012-10-07 ENCOUNTER — Telehealth: Payer: Self-pay | Admitting: Internal Medicine

## 2012-10-07 VITALS — BP 114/71 | HR 81 | Temp 98.4°F

## 2012-10-07 DIAGNOSIS — Z452 Encounter for adjustment and management of vascular access device: Secondary | ICD-10-CM

## 2012-10-07 DIAGNOSIS — C9001 Multiple myeloma in remission: Secondary | ICD-10-CM

## 2012-10-07 DIAGNOSIS — C9000 Multiple myeloma not having achieved remission: Secondary | ICD-10-CM

## 2012-10-07 MED ORDER — SODIUM CHLORIDE 0.9 % IJ SOLN
10.0000 mL | INTRAMUSCULAR | Status: DC | PRN
  Administered 2012-10-07: 10 mL via INTRAVENOUS
  Filled 2012-10-07: qty 10

## 2012-10-07 MED ORDER — HEPARIN SOD (PORK) LOCK FLUSH 100 UNIT/ML IV SOLN
500.0000 [IU] | Freq: Once | INTRAVENOUS | Status: AC
  Administered 2012-10-07: 500 [IU] via INTRAVENOUS
  Filled 2012-10-07: qty 5

## 2012-10-07 NOTE — Patient Instructions (Signed)
Patient to scheduling for flush appointments.  Instructed to call MD for problems or concerns

## 2012-10-15 ENCOUNTER — Other Ambulatory Visit: Payer: Self-pay | Admitting: *Deleted

## 2012-10-15 DIAGNOSIS — C9000 Multiple myeloma not having achieved remission: Secondary | ICD-10-CM

## 2012-10-15 MED ORDER — MORPHINE SULFATE ER 15 MG PO TBCR: 15.0000 mg | EXTENDED_RELEASE_TABLET | Freq: Two times a day (BID) | ORAL | Status: DC

## 2012-10-20 ENCOUNTER — Other Ambulatory Visit: Payer: Self-pay | Admitting: Internal Medicine

## 2012-10-20 DIAGNOSIS — C9000 Multiple myeloma not having achieved remission: Secondary | ICD-10-CM

## 2012-10-21 ENCOUNTER — Telehealth: Payer: Self-pay | Admitting: *Deleted

## 2012-10-21 NOTE — Telephone Encounter (Signed)
leukemia [employer] co-pay assistance program MD info form filled out, signed and faxed to [phone].  SLJ

## 2012-11-04 ENCOUNTER — Telehealth: Payer: Self-pay | Admitting: *Deleted

## 2012-11-04 NOTE — Telephone Encounter (Signed)
Pt dropped off a pamphlet and paperwork about carpal tunnel syndrome stating that her orthopaedic MD wants to do this procedure and she wants to make sure Dr Donnald Garre is okay with it.  Per Dr Donnald Garre, he feels it would be okay for her to have this procedure done.  Informed pt.  Pamphlet and paperwork mailed back to pt's home address per pt request.  SLJ

## 2012-11-12 ENCOUNTER — Ambulatory Visit
Admission: RE | Admit: 2012-11-12 | Discharge: 2012-11-12 | Disposition: A | Discharge status: Home / Self Care | Payer: Medicare HMO | Source: Ambulatory Visit

## 2012-11-12 DIAGNOSIS — Z1231 Encounter for screening mammogram for malignant neoplasm of breast: Secondary | ICD-10-CM

## 2012-11-16 ENCOUNTER — Other Ambulatory Visit (HOSPITAL_BASED_OUTPATIENT_CLINIC_OR_DEPARTMENT_OTHER): Payer: Medicare HMO

## 2012-11-16 DIAGNOSIS — C9000 Multiple myeloma not having achieved remission: Secondary | ICD-10-CM

## 2012-11-16 LAB — CBC WITH DIFFERENTIAL/PLATELET
BASO%: 0.8 % (ref 0.0–2.0)
EOS%: 1.2 % (ref 0.0–7.0)
HCT: 34.6 % — ABNORMAL LOW (ref 34.8–46.6)
LYMPH%: 31 % (ref 14.0–49.7)
MCH: 31.9 pg (ref 25.1–34.0)
MCHC: 33.7 g/dL (ref 31.5–36.0)
MCV: 94.7 fL (ref 79.5–101.0)
MONO%: 9 % (ref 0.0–14.0)
NEUT%: 58 % (ref 38.4–76.8)
Platelets: 263 10*3/uL (ref 145–400)

## 2012-11-16 LAB — COMPREHENSIVE METABOLIC PANEL (CC13)
ALT: 18 U/L (ref 0–55)
AST: 14 U/L (ref 5–34)
Creatinine: 0.9 mg/dL (ref 0.6–1.1)
Total Bilirubin: 1.18 mg/dL (ref 0.20–1.20)

## 2012-11-17 LAB — KAPPA/LAMBDA LIGHT CHAINS
Kappa free light chain: 2.53 mg/dL — ABNORMAL HIGH (ref 0.33–1.94)
Kappa:Lambda Ratio: 0.11 — ABNORMAL LOW (ref 0.26–1.65)
Lambda Free Lght Chn: 23.4 mg/dL — ABNORMAL HIGH (ref 0.57–2.63)

## 2012-11-17 LAB — IGG, IGA, IGM
IgA: 250 mg/dL (ref 69–380)
IgM, Serum: 40 mg/dL — ABNORMAL LOW (ref 52–322)

## 2012-11-18 ENCOUNTER — Ambulatory Visit (HOSPITAL_BASED_OUTPATIENT_CLINIC_OR_DEPARTMENT_OTHER): Payer: Medicare HMO | Admitting: Internal Medicine

## 2012-11-18 ENCOUNTER — Telehealth: Payer: Self-pay | Admitting: Internal Medicine

## 2012-11-18 ENCOUNTER — Ambulatory Visit (HOSPITAL_BASED_OUTPATIENT_CLINIC_OR_DEPARTMENT_OTHER): Payer: Medicare HMO

## 2012-11-18 ENCOUNTER — Encounter: Payer: Self-pay | Admitting: Internal Medicine

## 2012-11-18 ENCOUNTER — Telehealth: Payer: Self-pay | Admitting: *Deleted

## 2012-11-18 VITALS — BP 121/71 | HR 71 | Temp 97.0°F | Resp 20

## 2012-11-18 VITALS — BP 114/73 | HR 77 | Temp 97.1°F | Resp 20 | Ht 66.0 in | Wt 179.8 lb

## 2012-11-18 DIAGNOSIS — C9000 Multiple myeloma not having achieved remission: Secondary | ICD-10-CM

## 2012-11-18 DIAGNOSIS — C9002 Multiple myeloma in relapse: Secondary | ICD-10-CM

## 2012-11-18 MED ORDER — HYDROCODONE-ACETAMINOPHEN 7.5-325 MG PO TABS: 1.0000 | ORAL_TABLET | Freq: Four times a day (QID) | ORAL | Status: DC | PRN

## 2012-11-18 MED ORDER — MORPHINE SULFATE ER 15 MG PO TBCR: 15.0000 mg | EXTENDED_RELEASE_TABLET | Freq: Two times a day (BID) | ORAL | Status: DC

## 2012-11-18 MED ORDER — DEXAMETHASONE 4 MG PO TABS: ORAL_TABLET | ORAL | Status: DC

## 2012-11-18 MED ORDER — SODIUM CHLORIDE 0.9 % IJ SOLN
10.0000 mL | INTRAMUSCULAR | Status: DC | PRN
  Administered 2012-11-18: 10 mL via INTRAVENOUS
  Filled 2012-11-18: qty 10

## 2012-11-18 MED ORDER — HEPARIN SOD (PORK) LOCK FLUSH 100 UNIT/ML IV SOLN
500.0000 [IU] | Freq: Once | INTRAVENOUS | Status: AC
  Administered 2012-11-18: 500 [IU] via INTRAVENOUS
  Filled 2012-11-18: qty 5

## 2012-11-18 NOTE — Patient Instructions (Signed)
Myeloma panel showed evidence for disease progression.  We'll discuss treatment options including resuming treatment with Velcade and Decadron.  Followup visit in 2 weeks.

## 2012-11-18 NOTE — Telephone Encounter (Signed)
Per staff phone call and POF I have schedueld appts.  JMW  

## 2012-11-18 NOTE — Progress Notes (Signed)
[facility] [phone]   [phone]  OFFICE PROGRESS NOTE  [name],[name], [name] [address], [place]. [address] [place] [address]  DIAGNOSIS: Recurrent multiple myeloma initially diagnosed in October 2008.   PRIOR THERAPY:  1. Status post palliative radiotherapy to the lumbar spine between L3 and L5. The patient received a total dose of 3000 cGy in 10 fractions under the care of Dr. Mitzi Hansen between May 05, 2007 through May 18, 2007. 2. Status post 5 cycles of systemic chemotherapy with Revlimid and low-dose Decadron with good response to this treatment. 3. Status post autologous peripheral blood stem cell transplant at [facility] on Nov 20, 2007 under the care of Dr. Vicente Serene. 4. Status post treatment for disease recurrence with Velcade, Doxil and Decadron. Last dose given Nov 09, 2009. Discontinued secondary to intolerance but the patient had a good response to treatment at that time. 5. Status post palliative radiotherapy to the T2-T6 thoracic vertebrae completed 03/21/2011 under the care of Dr. Mitzi Hansen. 6. Systemic chemotherapy with Velcade 1.3 mg per meter squared given on days 1, 4, 8 and 11, and Doxil at 30 mg per meter squared given on day 4 in addition to Decadron status post 4 cycles, discontinued secondary to intolerance.   CURRENT THERAPY: Observation.   INTERVAL HISTORY: [name] 62 y.o. female returns to the clinic today for followup visit accompanied by her son. The patient continues to have a lot of vague complains especially low back pain and aching pain in the lower extremities. She denied having any significant weight loss or night sweats. She denied having any nausea or vomiting. She has no chest pain, shortness breath, cough or hemoptysis. She had repeat myeloma panel performed recently and she is here for evaluation and discussion of her lab results.  MEDICAL HISTORY: Past Medical History  Diagnosis Date  .  Thyroid disease Hypothyroidism  . Hypercholesterolemia   . Compression fracture 04/08/2007    pathologic compression fracture  . Hypothyroidism   . FHx: chemotherapy     s/p 5 cycle revlimid/low dose decadron,s/p velcade,doxil,decadron,  . Hx of radiation therapy 05/05/07-05/18/07,& 03/05/11-03/21/11-    l3&l5 in 2008, t2-t6 03/2011  . GERD (gastroesophageal reflux disease)   . Insomnia     associated with steroids  . Nausea   . Constipation     takes oxycontin,vicodin  . Hx of radiation therapy 05/05/2007 to 05/18/2007    palliative, L3-5  . 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 Hx of radiation therapy 03/05/2011 to 03/21/2011    palliative T2-T6, c-spine  . History of autologous stem cell transplant 11/20/2007    [facility], Dr Vicente Serene    ALLERGIES:  has No Known Allergies.  MEDICATIONS:  Current Outpatient Prescriptions  Medication Sig Dispense Refill  . ALPRAZolam (XANAX) 0.5 MG tablet Take 0.5 mg by mouth Twice daily as needed.      . cholecalciferol (VITAMIN D) 1000 UNITS tablet Take 1,000 Units by mouth daily.       Marland Kitchen ESZOPICLONE 3 MG tablet Take 3 mg by mouth at bedtime. scheduled      . gabapentin (NEURONTIN) 100 MG capsule Take 100 mg by mouth 3 (three) times daily.       . hydrochlorothiazide (HYDRODIURIL) 25 MG tablet Take 25 mg by mouth Daily.      Marland Kitchen HYDROcodone-acetaminophen (NORCO) 7.5-325 MG per tablet TAKE 1 TABLET BY MOUTH EVERY 6 HOURS  30 tablet  0  . levothyroxine (  SYNTHROID, LEVOTHROID) 100 MCG tablet Take 100 mcg by mouth daily.       Marland Kitchen lidocaine-prilocaine (EMLA) cream APPLY TO PORT 1 TO 2 HRS PRIOR TO PROCEDURE  30 g  0  . morphine (MS CONTIN) 15 MG 12 hr tablet Take 1 tablet (15 mg total) by mouth 2 (two) times daily.  60 tablet  0  . Multiple Vitamin (MULITIVITAMIN WITH MINERALS) TABS Take 1 tablet by mouth daily.      . ondansetron (ZOFRAN-ODT) 4 MG disintegrating tablet Take 4 mg by mouth 2 (two) times daily as needed. For nausea      . pantoprazole (PROTONIX) 40  MG tablet TAKE 1 TABLET BY MOUTH EVERY DAY  30 tablet  3   No current facility-administered medications for this visit.   Facility-Administered Medications Ordered in Other Visits  Medication Dose Route Frequency Provider Last Rate Last Dose  . 0.9 %  sodium chloride infusion   Intravenous Once Si Gaul, MD      . ondansetron (ZOFRAN) IVPB 8 mg  8 mg Intravenous Once Si Gaul, MD      . sodium chloride 0.9 % injection 10 mL  10 mL Intracatheter PRN Si Gaul, MD        SURGICAL HISTORY:  Past Surgical History  Procedure Laterality Date  . Cholecystectomy    . Knee surgery    . Knee surgery    . Video bronchoscopy  07/30/2011    Procedure: VIDEO BRONCHOSCOPY WITHOUT FLUORO;  Surgeon: Barbaraann Share, MD;  Location: [facility];  Service: Cardiopulmonary;  Laterality: Bilateral;    REVIEW OF SYSTEMS:  A comprehensive review of systems was negative except for: Constitutional: positive for fatigue Musculoskeletal: positive for Aching pain in the lower back and lower extremities   PHYSICAL EXAMINATION: General appearance: alert, cooperative and no distress Head: Normocephalic, without obvious abnormality, atraumatic Neck: no adenopathy Lymph nodes: Cervical, supraclavicular, and axillary nodes normal. Resp: clear to auscultation bilaterally Cardio: regular rate and rhythm, S1, S2 normal, no murmur, click, rub or gallop GI: soft, non-tender; bowel sounds normal; no masses,  no organomegaly Extremities: extremities normal, atraumatic, no cyanosis or edema Neurologic: Alert and oriented X 3, normal strength and tone. Normal symmetric reflexes. Normal coordination and gait  ECOG PERFORMANCE STATUS: 1 - Symptomatic but completely ambulatory  Blood pressure 114/73, pulse 77, temperature 97.1 F (36.2 C), temperature source Oral, resp. rate 20, height 5\' 6"  (1.676 m), weight 179 lb 12.8 oz (81.557 kg).  LABORATORY DATA: Lab Results  Component Value Date   WBC 5.1  11/16/2012   HGB 11.6 11/16/2012   HCT 34.6* 11/16/2012   MCV 94.7 11/16/2012   PLT 263 11/16/2012      Chemistry      Component Value Date/Time   NA 140 11/16/2012 1011   NA 139 05/31/2012 1600   K 3.7 11/16/2012 1011   K 3.4* 05/31/2012 1600   CL 104 11/16/2012 1011   CL 105 05/31/2012 1600   CO2 26 11/16/2012 1011   CO2 25 05/31/2012 1600   BUN 15.5 11/16/2012 1011   BUN 13 05/31/2012 1600   CREATININE 0.9 11/16/2012 1011   CREATININE 0.80 05/31/2012 1600      Component Value Date/Time   CALCIUM 9.9 11/16/2012 1011   CALCIUM 10.1 05/31/2012 1600   ALKPHOS 84 11/16/2012 1011   ALKPHOS 87 05/31/2012 1600   AST 14 11/16/2012 1011   AST 17 05/31/2012 1600   ALT 18 11/16/2012 1011   ALT  16 05/31/2012 1600   BILITOT 1.18 11/16/2012 1011   BILITOT 0.7 05/31/2012 1600     Other lab results: Beta-2 microglobulin 2.24, free kappa light chain 2.53, free lambda light chain 23.4 with kappa:lambda ratio ratio of 0.11. IgG 1680, IgA 250 and IgM 40.  RADIOGRAPHIC STUDIES: Mm Digital Screening  11/12/2012   *RADIOLOGY REPORT*  Clinical Data: Screening.  DIGITAL BILATERAL SCREENING MAMMOGRAM WITH CAD  Comparison:  Previous exams.  FINDINGS:  ACR Breast Density Category 2: There is a scattered fibroglandular pattern.  No suspicious masses, architectural distortion, or calcifications are present.  Images were processed with CAD.  IMPRESSION: No mammographic evidence of malignancy.  A result letter of this screening mammogram will be mailed directly to the patient.  RECOMMENDATION: Screening mammogram in one year. (Code:SM-B-01Y)  BI-RADS CATEGORY 1:  Negative.   Original Report Authenticated By: Rolla Plate, M.D.    ASSESSMENT: This is a very pleasant 62 years old African American female with recurrent multiple myeloma and evidence for disease progression on her recent myeloma panel with increase in the free lambda light chain.   PLAN: I had a lengthy discussion with the patient and her son today. I gave her  the option of continuous observation versus resuming systemic therapy with single agent Velcade 1.3 MG/M2 subcutaneously on a weekly basis in addition to Decadron 40 mg weekly. I discussed with the patient adverse effects of this treatment including but not limited to myelosuppression, peripheral neuropathy, nausea or vomiting. The patient would like to proceed with treatment as planned. She is expected to start the first cycle of this treatment tomorrow. She would come back for followup visit in 2 weeks for reevaluation and management any adverse effect of her treatment. All questions were answered. The patient knows to call the clinic with any problems, questions or concerns. We can certainly see the patient much sooner if necessary.

## 2012-11-18 NOTE — Patient Instructions (Signed)
Call MD for problems or concerns 

## 2012-11-18 NOTE — Telephone Encounter (Signed)
gva dn printed appt sched and avs for pt...MW added tx

## 2012-11-19 ENCOUNTER — Other Ambulatory Visit (HOSPITAL_BASED_OUTPATIENT_CLINIC_OR_DEPARTMENT_OTHER): Payer: Medicare HMO | Admitting: Lab

## 2012-11-19 ENCOUNTER — Ambulatory Visit (HOSPITAL_BASED_OUTPATIENT_CLINIC_OR_DEPARTMENT_OTHER): Payer: Medicare HMO

## 2012-11-19 ENCOUNTER — Encounter: Payer: Self-pay | Admitting: Internal Medicine

## 2012-11-19 ENCOUNTER — Other Ambulatory Visit: Payer: Medicare HMO | Admitting: Lab

## 2012-11-19 VITALS — BP 106/68 | HR 73 | Temp 98.0°F | Resp 20

## 2012-11-19 DIAGNOSIS — C9000 Multiple myeloma not having achieved remission: Secondary | ICD-10-CM

## 2012-11-19 DIAGNOSIS — Z5112 Encounter for antineoplastic immunotherapy: Secondary | ICD-10-CM

## 2012-11-19 LAB — COMPREHENSIVE METABOLIC PANEL (CC13)
ALT: 16 U/L (ref 0–55)
AST: 14 U/L (ref 5–34)
Alkaline Phosphatase: 114 U/L (ref 40–150)
BUN: 20.9 mg/dL (ref 7.0–26.0)
Calcium: 9.9 mg/dL (ref 8.4–10.4)
Chloride: 104 mEq/L (ref 98–107)
Creatinine: 1.1 mg/dL (ref 0.6–1.1)

## 2012-11-19 LAB — CBC WITH DIFFERENTIAL/PLATELET
BASO%: 0.1 % (ref 0.0–2.0)
EOS%: 0 % (ref 0.0–7.0)
HCT: 33.8 % — ABNORMAL LOW (ref 34.8–46.6)
MCH: 32 pg (ref 25.1–34.0)
MCHC: 34.3 g/dL (ref 31.5–36.0)
MCV: 93.4 fL (ref 79.5–101.0)
MONO%: 1.1 % (ref 0.0–14.0)
NEUT%: 83.2 % — ABNORMAL HIGH (ref 38.4–76.8)
RDW: 13.3 % (ref 11.2–14.5)
lymph#: 1.1 10*3/uL (ref 0.9–3.3)

## 2012-11-19 MED ORDER — BORTEZOMIB CHEMO SQ INJECTION 3.5 MG (2.5MG/ML)
1.3000 mg/m2 | Freq: Once | INTRAMUSCULAR | Status: AC
  Administered 2012-11-19: 2.5 mg via SUBCUTANEOUS
  Filled 2012-11-19: qty 2.5

## 2012-11-19 MED ORDER — ONDANSETRON HCL 8 MG PO TABS
8.0000 mg | ORAL_TABLET | Freq: Once | ORAL | Status: AC
  Administered 2012-11-19: 8 mg via ORAL

## 2012-11-19 NOTE — Patient Instructions (Addendum)
Cancer Center Discharge Instructions for Patients Receiving Chemotherapy  Today you received the following chemotherapy agents Velcade  To help prevent nausea and vomiting after your treatment, we encourage you to take your nausea medication Zofran Begin taking it at 8pm and take it as often as prescribed for the next 24-36hours.   If you develop nausea and vomiting that is not controlled by your nausea medication, call the clinic. If it is after clinic hours your family physician or the after hours number for the clinic or go to the Emergency Department.   BELOW ARE SYMPTOMS THAT SHOULD BE REPORTED IMMEDIATELY:  *FEVER GREATER THAN 100.5 F  *CHILLS WITH OR WITHOUT FEVER  NAUSEA AND VOMITING THAT IS NOT CONTROLLED WITH YOUR NAUSEA MEDICATION  *UNUSUAL SHORTNESS OF BREATH  *UNUSUAL BRUISING OR BLEEDING  TENDERNESS IN MOUTH AND THROAT WITH OR WITHOUT PRESENCE OF ULCERS  *URINARY PROBLEMS  *BOWEL PROBLEMS  UNUSUAL RASH Items with * indicate a potential emergency and should be followed up as soon as possible.  One of the nurses will contact you 24 hours after your treatment. Please let the nurse know about any problems that you may have experienced. Feel free to call the clinic you have any questions or concerns. The clinic phone number is [phone].   I have been informed and understand all the instructions given to me. I know to contact the clinic, my physician, or go to the Emergency Department if any problems should occur. I do not have any questions at this time, but understand that I may call the clinic during office hours   should I have any questions or need assistance in obtaining follow up care.   Bortezomib injection (Velcade) What is this medicine? BORTEZOMIB (bor TEZ oh mib) is a chemotherapy drug. It slows the growth of cancer cells. This medicine is used to treat multiple myeloma, lymphoma, and other cancers. This medicine may be used for other  purposes; ask your health care provider or pharmacist if you have questions. What should I tell my health care provider before I take this medicine? They need to know if you have any of these conditions: -heart disease -irregular heartbeat -liver disease -low blood counts, like low white blood cells, platelets, or hemoglobin -peripheral neuropathy -taking medicine for blood pressure -an unusual or allergic reaction to bortezomib, mannitol, boron, other medicines, foods, dyes, or preservatives -pregnant or trying to get pregnant -breast-feeding How should I use this medicine? This medicine is for injection into a vein or for injection under the skin. It is given by a health care professional in a hospital or clinic setting. Talk to your pediatrician regarding the use of this medicine in children. Special care may be needed. Overdosage: If you think you have taken too much of this medicine contact a poison control center or emergency room at once. NOTE: This medicine is only for you. Do not share this medicine with others. What if I miss a dose? It is important not to miss your dose. Call your doctor or health care professional if you are unable to keep an appointment. What may interact with this medicine? -medicines for diabetes -medicines to increase blood counts like filgrastim, pegfilgrastim, sargramostim -zalcitabine Talk to your doctor or health care professional before taking any of these medicines: -acetaminophen -aspirin -ibuprofen -ketoprofen -naproxen This list may not describe all possible interactions. Give your health care provider a list of all the medicines, herbs, non-prescription drugs, or dietary supplements you use. Also tell  them if you smoke, drink alcohol, or use illegal drugs. Some items may interact with your medicine. What should I watch for while using this medicine? Visit your doctor for checks on your progress. This drug may make you feel generally unwell.  This is not uncommon, as chemotherapy can affect healthy cells as well as cancer cells. Report any side effects. Continue your course of treatment even though you feel ill unless your doctor tells you to stop. You may get drowsy or dizzy. Do not drive, use machinery, or do anything that needs mental alertness until you know how this medicine affects you. Do not stand or sit up quickly, especially if you are an older patient. This reduces the risk of dizzy or fainting spells. In some cases, you may be given additional medicines to help with side effects. Follow all directions for their use. Call your doctor or health care professional for advice if you get a fever, chills or sore throat, or other symptoms of a cold or flu. Do not treat yourself. This drug decreases your body's ability to fight infections. Try to avoid being around people who are sick. This medicine may increase your risk to bruise or bleed. Call your doctor or health care professional if you notice any unusual bleeding. Be careful brushing and flossing your teeth or using a toothpick because you may get an infection or bleed more easily. If you have any dental work done, tell your dentist you are receiving this medicine. Avoid taking products that contain aspirin, acetaminophen, ibuprofen, naproxen, or ketoprofen unless instructed by your doctor. These medicines may hide a fever. Do not become pregnant while taking this medicine. Women should inform their doctor if they wish to become pregnant or think they might be pregnant. There is a potential for serious side effects to an unborn child. Talk to your health care professional or pharmacist for more information. Do not breast-feed an infant while taking this medicine. You may have vomiting or diarrhea while taking this medicine. Drink water or other fluids as directed. What side effects may I notice from receiving this medicine? Side effects that you should report to your doctor or health  care professional as soon as possible: -allergic reactions like skin rash, itching or hives, swelling of the face, lips, or tongue -breathing problems -changes in hearing -changes in vision -fast, irregular heartbeat -feeling faint or lightheaded, falls -pain, tingling, numbness in the hands or feet -seizures -swelling of the ankles, feet, hands -unusual bleeding or bruising -unusually weak or tired -vomiting Side effects that usually do not require medical attention (report to your doctor or health care professional if they continue or are bothersome): -changes in emotions or moods -constipation -diarrhea -loss of appetite -headache -irritation at site where injected -nausea This list may not describe all possible side effects. Call your doctor for medical advice about side effects. You may report side effects to FDA at 1-800-FDA-1088. Where should I keep my medicine? This drug is given in a hospital or clinic and will not be stored at home. NOTE: This sheet is a summary. It may not cover all possible information. If you have questions about this medicine, talk to your doctor, pharmacist, or health care provider.  2012, [employer]. (07/25/2010 11:42:36 AM)

## 2012-11-19 NOTE — Progress Notes (Signed)
Patient came in and have [employer] papers. I scanned and emailed them to billing- Peyton Najjar.

## 2012-11-20 ENCOUNTER — Telehealth: Payer: Self-pay | Admitting: *Deleted

## 2012-11-20 NOTE — Telephone Encounter (Signed)
Message copied by Kathlynn Grate on Fri Nov 20, 2012  4:20 PM ------      Message from: Kallie Locks      Created: Thu Nov 19, 2012  3:27 PM      Regarding: "1st time Chemotherapy"      Contact: [phone]       Patient received Velcade SQ 1st time, per Dr. Arbutus Ped; tolerated treatment well. ------

## 2012-11-20 NOTE — Telephone Encounter (Signed)
Spoke with spouse, he states patient seems to be doing fine, she currently is at another appointment. Instructed husband to please tell patient to call us with any issues.

## 2012-11-26 ENCOUNTER — Other Ambulatory Visit: Payer: Medicare HMO | Admitting: Lab

## 2012-11-27 ENCOUNTER — Other Ambulatory Visit (HOSPITAL_BASED_OUTPATIENT_CLINIC_OR_DEPARTMENT_OTHER): Payer: Medicare HMO | Admitting: Lab

## 2012-11-27 ENCOUNTER — Ambulatory Visit (HOSPITAL_BASED_OUTPATIENT_CLINIC_OR_DEPARTMENT_OTHER): Payer: Medicare HMO

## 2012-11-27 VITALS — BP 112/70 | HR 90 | Temp 99.2°F

## 2012-11-27 DIAGNOSIS — C9000 Multiple myeloma not having achieved remission: Secondary | ICD-10-CM

## 2012-11-27 DIAGNOSIS — Z5112 Encounter for antineoplastic immunotherapy: Secondary | ICD-10-CM

## 2012-11-27 LAB — COMPREHENSIVE METABOLIC PANEL (CC13)
AST: 18 U/L (ref 5–34)
Albumin: 3.5 g/dL (ref 3.5–5.0)
Alkaline Phosphatase: 108 U/L (ref 40–150)
BUN: 20.9 mg/dL (ref 7.0–26.0)
Creatinine: 0.9 mg/dL (ref 0.6–1.1)
Glucose: 127 mg/dl — ABNORMAL HIGH (ref 70–99)
Potassium: 3.3 mEq/L — ABNORMAL LOW (ref 3.5–5.1)

## 2012-11-27 LAB — CBC WITH DIFFERENTIAL/PLATELET
Basophils Absolute: 0 10*3/uL (ref 0.0–0.1)
EOS%: 0.2 % (ref 0.0–7.0)
Eosinophils Absolute: 0 10*3/uL (ref 0.0–0.5)
HCT: 33.2 % — ABNORMAL LOW (ref 34.8–46.6)
HGB: 11.2 g/dL — ABNORMAL LOW (ref 11.6–15.9)
LYMPH%: 13.2 % — ABNORMAL LOW (ref 14.0–49.7)
MCH: 31.8 pg (ref 25.1–34.0)
MCV: 94.4 fL (ref 79.5–101.0)
MONO%: 2.4 % (ref 0.0–14.0)
NEUT#: 4.9 10*3/uL (ref 1.5–6.5)
NEUT%: 83.8 % — ABNORMAL HIGH (ref 38.4–76.8)
Platelets: 228 10*3/uL (ref 145–400)
RDW: 13.8 % (ref 11.2–14.5)

## 2012-11-27 MED ORDER — ONDANSETRON HCL 8 MG PO TABS
8.0000 mg | ORAL_TABLET | Freq: Once | ORAL | Status: AC
  Administered 2012-11-27: 8 mg via ORAL

## 2012-11-27 MED ORDER — BORTEZOMIB CHEMO SQ INJECTION 3.5 MG (2.5MG/ML)
1.3000 mg/m2 | Freq: Once | INTRAMUSCULAR | Status: AC
  Administered 2012-11-27: 2.5 mg via SUBCUTANEOUS
  Filled 2012-11-27: qty 2.5

## 2012-11-27 NOTE — Progress Notes (Signed)
PT states she had low grade temp of 99 early this week.  Pt also states she has some constipation despite taking 1 senokot-S daily, last BM was yesterday.  Pt also states she has gotten some muscle cramps in her R leg foot and L lower back that is intermittent, but resolves.  Pt also states she has trouble sleeping despite taking lunesta 3 mg nightly, ordered originally by rad onc.  Informed Adrena, PA of above.  Pt may increase senokot-S to 1-2 tabs BID, should monitor temp and call if fever, and discuss muscle cramps with MD next week, as velcade should not cause this, as well as discuss insomnia with MD.  Informed pt and daughter of this, and she verbalizes understanding.

## 2012-11-27 NOTE — Patient Instructions (Signed)
[facility] Discharge Instructions for Patients Receiving Chemotherapy  Today you received the following chemotherapy agents: Velcade  To help prevent nausea and vomiting after your treatment, we encourage you to take your nausea medication as directed by your MD.  If you develop nausea and vomiting that is not controlled by your nausea medication, call the clinic. If it is after clinic hours your family physician or the after hours number for the clinic or go to the Emergency Department.   BELOW ARE SYMPTOMS THAT SHOULD BE REPORTED IMMEDIATELY:  *FEVER GREATER THAN 100.5 F  *CHILLS WITH OR WITHOUT FEVER  NAUSEA AND VOMITING THAT IS NOT CONTROLLED WITH YOUR NAUSEA MEDICATION  *UNUSUAL SHORTNESS OF BREATH  *UNUSUAL BRUISING OR BLEEDING  TENDERNESS IN MOUTH AND THROAT WITH OR WITHOUT PRESENCE OF ULCERS  *URINARY PROBLEMS  *BOWEL PROBLEMS  UNUSUAL RASH Items with * indicate a potential emergency and should be followed up as soon as possible.   Feel free to call the clinic you have any questions or concerns. The clinic phone number is [phone].    

## 2012-12-03 ENCOUNTER — Encounter: Payer: Self-pay | Admitting: Internal Medicine

## 2012-12-03 ENCOUNTER — Ambulatory Visit (HOSPITAL_BASED_OUTPATIENT_CLINIC_OR_DEPARTMENT_OTHER): Payer: Medicare HMO

## 2012-12-03 ENCOUNTER — Ambulatory Visit (HOSPITAL_BASED_OUTPATIENT_CLINIC_OR_DEPARTMENT_OTHER): Payer: Medicare HMO | Admitting: Internal Medicine

## 2012-12-03 ENCOUNTER — Telehealth: Payer: Self-pay | Admitting: Internal Medicine

## 2012-12-03 ENCOUNTER — Other Ambulatory Visit (HOSPITAL_BASED_OUTPATIENT_CLINIC_OR_DEPARTMENT_OTHER): Payer: Medicare HMO | Admitting: Lab

## 2012-12-03 VITALS — BP 122/69 | HR 78 | Temp 97.8°F | Resp 18 | Ht 66.0 in | Wt 187.6 lb

## 2012-12-03 DIAGNOSIS — C9000 Multiple myeloma not having achieved remission: Secondary | ICD-10-CM

## 2012-12-03 DIAGNOSIS — C9002 Multiple myeloma in relapse: Secondary | ICD-10-CM

## 2012-12-03 DIAGNOSIS — M545 Low back pain: Secondary | ICD-10-CM

## 2012-12-03 DIAGNOSIS — Z9484 Stem cells transplant status: Secondary | ICD-10-CM

## 2012-12-03 DIAGNOSIS — Z5112 Encounter for antineoplastic immunotherapy: Secondary | ICD-10-CM

## 2012-12-03 DIAGNOSIS — R252 Cramp and spasm: Secondary | ICD-10-CM

## 2012-12-03 LAB — COMPREHENSIVE METABOLIC PANEL (CC13)
Albumin: 3.1 g/dL — ABNORMAL LOW (ref 3.5–5.0)
BUN: 12.7 mg/dL (ref 7.0–26.0)
Calcium: 8.9 mg/dL (ref 8.4–10.4)
Chloride: 105 mEq/L (ref 98–107)
Creatinine: 1 mg/dL (ref 0.6–1.1)
Glucose: 108 mg/dl — ABNORMAL HIGH (ref 70–99)
Potassium: 4 mEq/L (ref 3.5–5.1)

## 2012-12-03 LAB — CBC WITH DIFFERENTIAL/PLATELET
Basophils Absolute: 0 10*3/uL (ref 0.0–0.1)
Eosinophils Absolute: 0 10*3/uL (ref 0.0–0.5)
HCT: 31.6 % — ABNORMAL LOW (ref 34.8–46.6)
HGB: 10.6 g/dL — ABNORMAL LOW (ref 11.6–15.9)
MCH: 32.1 pg (ref 25.1–34.0)
MCV: 95.8 fL (ref 79.5–101.0)
MONO%: 2.3 % (ref 0.0–14.0)
NEUT#: 6.6 10*3/uL — ABNORMAL HIGH (ref 1.5–6.5)
NEUT%: 84.4 % — ABNORMAL HIGH (ref 38.4–76.8)
RDW: 14.4 % (ref 11.2–14.5)
lymph#: 1 10*3/uL (ref 0.9–3.3)

## 2012-12-03 MED ORDER — ONDANSETRON HCL 8 MG PO TABS
8.0000 mg | ORAL_TABLET | Freq: Once | ORAL | Status: AC
  Administered 2012-12-03: 8 mg via ORAL

## 2012-12-03 MED ORDER — BORTEZOMIB CHEMO SQ INJECTION 3.5 MG (2.5MG/ML)
1.3000 mg/m2 | Freq: Once | INTRAMUSCULAR | Status: AC
  Administered 2012-12-03: 2.5 mg via SUBCUTANEOUS
  Filled 2012-12-03: qty 2.5

## 2012-12-03 NOTE — Progress Notes (Signed)
[facility] [phone]   [phone]  OFFICE PROGRESS NOTE  [name], R, [name] [address], [place]. [address] [place] [address]  DIAGNOSIS: Recurrent multiple myeloma initially diagnosed in October 2008.   PRIOR THERAPY:  1. Status post palliative radiotherapy to the lumbar spine between L3 and L5. The patient received a total dose of 3000 cGy in 10 fractions under the care of Dr. Mitzi Hansen between May 05, 2007 through May 18, 2007. 2. Status post 5 cycles of systemic chemotherapy with Revlimid and low-dose Decadron with good response to this treatment. 3. Status post autologous peripheral blood stem cell transplant at [facility] on Nov 20, 2007 under the care of Dr. Vicente Serene. 4. Status post treatment for disease recurrence with Velcade, Doxil and Decadron. Last dose given Nov 09, 2009. Discontinued secondary to intolerance but the patient had a good response to treatment at that time. 5. Status post palliative radiotherapy to the T2-T6 thoracic vertebrae completed 03/21/2011 under the care of Dr. Mitzi Hansen. 6. Systemic chemotherapy with Velcade 1.3 mg per meter squared given on days 1, 4, 8 and 11, and Doxil at 30 mg per meter squared given on day 4 in addition to Decadron status post 4 cycles, discontinued secondary to intolerance.   CURRENT THERAPY: systemic therapy with Velcade 1.3 mg/M2 subcutaneously in addition to Decadron 40 mg by mouth on a weekly basis, status post 3 cycles.   INTERVAL HISTORY: [name] 62 y.o. female returns to the clinic today for follow up visit accompanied by her daughter. The patient is tolerating her current treatment with Velcade and Decadron fairly well. She denied having any significant nausea or vomiting. The patient denied having any significant fever or chills. The patient denied having any significant chest pain, shortness of breath, cough or hemoptysis. She continues to have some aching pain  especially in the lower back and occasional cramps in her legs.   MEDICAL HISTORY: Past Medical History  Diagnosis Date  . Thyroid disease Hypothyroidism  . Hypercholesterolemia   . Compression fracture 04/08/2007    pathologic compression fracture  . Hypothyroidism   . FHx: chemotherapy     s/p 5 cycle revlimid/low dose decadron,s/p velcade,doxil,decadron,  . Hx of radiation therapy 05/05/07-05/18/07,& 03/05/11-03/21/11-    l3&l5 in 2008, t2-t6 03/2011  . GERD (gastroesophageal reflux disease)   . Insomnia     associated with steroids  . Nausea   . Constipation     takes oxycontin,vicodin  . Hx of radiation therapy 05/05/2007 to 05/18/2007    palliative, L3-5  . 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 Hx of radiation therapy 03/05/2011 to 03/21/2011    palliative T2-T6, c-spine  . History of autologous stem cell transplant 11/20/2007    [facility], Dr Vicente Serene    ALLERGIES:  has No Known Allergies.  MEDICATIONS:  Current Outpatient Prescriptions  Medication Sig Dispense Refill  . ALPRAZolam (XANAX) 0.5 MG tablet Take 0.5 mg by mouth Twice daily as needed.      Marland Kitchen dexamethasone (DECADRON) 4 MG tablet 10 tablet by mouth on a weekly basis starting with the first cycle of chemotherapy.  80 tablet  2  . dicyclomine (BENTYL) 20 MG tablet Take 1 tablet by mouth 2 (two) times daily.      Marland Kitchen ESZOPICLONE 3 MG tablet Take 3 mg by mouth at bedtime. scheduled      . gabapentin (NEURONTIN) 100 MG capsule Take 100 mg by mouth 3 (  three) times daily.       . hydrochlorothiazide (HYDRODIURIL) 25 MG tablet Take 25 mg by mouth Daily.      Marland Kitchen HYDROcodone-acetaminophen (NORCO) 7.5-325 MG per tablet Take 1 tablet by mouth every 6 (six) hours as needed for pain.  30 tablet  0  . levothyroxine (SYNTHROID, LEVOTHROID) 100 MCG tablet Take 100 mcg by mouth daily.       Marland Kitchen lidocaine-prilocaine (EMLA) cream APPLY TO PORT 1 TO 2 HRS PRIOR TO PROCEDURE  30 g  0  . loperamide (IMODIUM) 2 MG capsule Take 2 mg by mouth 4 (four)  times daily as needed for diarrhea or loose stools.      Marland Kitchen morphine (MS CONTIN) 15 MG 12 hr tablet Take 1 tablet (15 mg total) by mouth 2 (two) times daily.  60 tablet  0  . Multiple Vitamin (MULITIVITAMIN WITH MINERALS) TABS Take 1 tablet by mouth daily.      . pantoprazole (PROTONIX) 40 MG tablet TAKE 1 TABLET BY MOUTH EVERY DAY  30 tablet  3  . ondansetron (ZOFRAN-ODT) 4 MG disintegrating tablet Take 1 tablet by mouth every 8 (eight) hours as needed.       No current facility-administered medications for this visit.   Facility-Administered Medications Ordered in Other Visits  Medication Dose Route Frequency Provider Last Rate Last Dose  . 0.9 %  sodium chloride infusion   Intravenous Once Si Gaul, MD      . ondansetron (ZOFRAN) IVPB 8 mg  8 mg Intravenous Once Si Gaul, MD      . sodium chloride 0.9 % injection 10 mL  10 mL Intracatheter PRN Si Gaul, MD        SURGICAL HISTORY:  Past Surgical History  Procedure Laterality Date  . Cholecystectomy    . Knee surgery    . Knee surgery    . Video bronchoscopy  07/30/2011    Procedure: VIDEO BRONCHOSCOPY WITHOUT FLUORO;  Surgeon: Barbaraann Share, MD;  Location: [facility];  Service: Cardiopulmonary;  Laterality: Bilateral;    REVIEW OF SYSTEMS:  A comprehensive review of systems was negative except for: Musculoskeletal: positive for back pain   PHYSICAL EXAMINATION: General appearance: alert, cooperative and no distress Head: Normocephalic, without obvious abnormality, atraumatic Neck: no adenopathy Lymph nodes: Cervical, supraclavicular, and axillary nodes normal. Resp: clear to auscultation bilaterally Cardio: regular rate and rhythm, S1, S2 normal, no murmur, click, rub or gallop GI: soft, non-tender; bowel sounds normal; no masses,  no organomegaly Extremities: extremities normal, atraumatic, no cyanosis or edema  ECOG PERFORMANCE STATUS: 1 - Symptomatic but completely ambulatory  Blood pressure 122/69,  pulse 78, temperature 97.8 F (36.6 C), temperature source Oral, resp. rate 18, height 5\' 6"  (1.676 m), weight 187 lb 9.6 oz (85.095 kg).  LABORATORY DATA: Lab Results  Component Value Date   WBC 7.8 12/03/2012   HGB 10.6* 12/03/2012   HCT 31.6* 12/03/2012   MCV 95.8 12/03/2012   PLT 198 12/03/2012      Chemistry      Component Value Date/Time   NA 141 11/27/2012 1322   NA 139 05/31/2012 1600   K 3.3* 11/27/2012 1322   K 3.4* 05/31/2012 1600   CL 107 11/27/2012 1322   CL 105 05/31/2012 1600   CO2 26 11/27/2012 1322   CO2 25 05/31/2012 1600   BUN 20.9 11/27/2012 1322   BUN 13 05/31/2012 1600   CREATININE 0.9 11/27/2012 1322   CREATININE 0.80 05/31/2012 1600  Component Value Date/Time   CALCIUM 9.7 11/27/2012 1322   CALCIUM 10.1 05/31/2012 1600   ALKPHOS 108 11/27/2012 1322   ALKPHOS 87 05/31/2012 1600   AST 18 11/27/2012 1322   AST 17 05/31/2012 1600   ALT 20 11/27/2012 1322   ALT 16 05/31/2012 1600   BILITOT 0.60 11/27/2012 1322   BILITOT 0.7 05/31/2012 1600       RADIOGRAPHIC STUDIES: Mm Digital Screening  11/12/2012   *RADIOLOGY REPORT*  Clinical Data: Screening.  DIGITAL BILATERAL SCREENING MAMMOGRAM WITH CAD  Comparison:  Previous exams.  FINDINGS:  ACR Breast Density Category 2: There is a scattered fibroglandular pattern.  No suspicious masses, architectural distortion, or calcifications are present.  Images were processed with CAD.  IMPRESSION: No mammographic evidence of malignancy.  A result letter of this screening mammogram will be mailed directly to the patient.  RECOMMENDATION: Screening mammogram in one year. (Code:SM-B-01Y)  BI-RADS CATEGORY 1:  Negative.   Original Report Authenticated By: Rolla Plate, M.D.    ASSESSMENT: this is a very pleasant 62 years old [place] American female with recurrent multiple myeloma. She is currently on treatment with Velcade and Decadron status post 3 weekly doses.   PLAN: the patient is doing fine and tolerating her treatment fairly well.  We'll proceed with cycle #4 today as scheduled. She would come back for follow up visit in 3 weeks for evaluation and management any adverse effect of her treatment. I may consider starting the patient on Zometa 4 mg every 4 weeks for her bone disease. She was advised to call immediately if she has any concerning symptoms in the interval.  All questions were answered. The patient knows to call the clinic with any problems, questions or concerns. We can certainly see the patient much sooner if necessary.

## 2012-12-03 NOTE — Telephone Encounter (Signed)
gv and printed appt sched and avs for pt....MW added tx   °

## 2012-12-03 NOTE — Patient Instructions (Addendum)
[facility] Discharge Instructions for Patients Receiving Chemotherapy  Today you received the following chemotherapy agents: Velcade.  To help prevent nausea and vomiting after your treatment, we encourage you to take your nausea medication as prescribed.   If you develop nausea and vomiting that is not controlled by your nausea medication, call the clinic.   BELOW ARE SYMPTOMS THAT SHOULD BE REPORTED IMMEDIATELY:  *FEVER GREATER THAN 100.5 F  *CHILLS WITH OR WITHOUT FEVER  NAUSEA AND VOMITING THAT IS NOT CONTROLLED WITH YOUR NAUSEA MEDICATION  *UNUSUAL SHORTNESS OF BREATH  *UNUSUAL BRUISING OR BLEEDING  TENDERNESS IN MOUTH AND THROAT WITH OR WITHOUT PRESENCE OF ULCERS  *URINARY PROBLEMS  *BOWEL PROBLEMS  UNUSUAL RASH Items with * indicate a potential emergency and should be followed up as soon as possible.  Feel free to call the clinic you have any questions or concerns. The clinic phone number is [phone].    

## 2012-12-04 ENCOUNTER — Encounter: Payer: Self-pay | Admitting: Internal Medicine

## 2012-12-04 NOTE — Patient Instructions (Signed)
Continue treatment with Velcade and Decadron. Follow up in 3 weeks

## 2012-12-10 ENCOUNTER — Ambulatory Visit (HOSPITAL_BASED_OUTPATIENT_CLINIC_OR_DEPARTMENT_OTHER): Payer: Medicare HMO

## 2012-12-10 ENCOUNTER — Other Ambulatory Visit (HOSPITAL_BASED_OUTPATIENT_CLINIC_OR_DEPARTMENT_OTHER): Payer: Medicare HMO | Admitting: Lab

## 2012-12-10 DIAGNOSIS — Z5112 Encounter for antineoplastic immunotherapy: Secondary | ICD-10-CM

## 2012-12-10 DIAGNOSIS — C9000 Multiple myeloma not having achieved remission: Secondary | ICD-10-CM

## 2012-12-10 DIAGNOSIS — C9002 Multiple myeloma in relapse: Secondary | ICD-10-CM

## 2012-12-10 LAB — CBC WITH DIFFERENTIAL/PLATELET
Basophils Absolute: 0 10*3/uL (ref 0.0–0.1)
Eosinophils Absolute: 0 10*3/uL (ref 0.0–0.5)
HCT: 32.9 % — ABNORMAL LOW (ref 34.8–46.6)
HGB: 11.4 g/dL — ABNORMAL LOW (ref 11.6–15.9)
MONO#: 0.5 10*3/uL (ref 0.1–0.9)
NEUT#: 3.4 10*3/uL (ref 1.5–6.5)
RDW: 14.9 % — ABNORMAL HIGH (ref 11.2–14.5)
lymph#: 1.6 10*3/uL (ref 0.9–3.3)

## 2012-12-10 LAB — COMPREHENSIVE METABOLIC PANEL (CC13)
AST: 11 U/L (ref 5–34)
Albumin: 3.2 g/dL — ABNORMAL LOW (ref 3.5–5.0)
BUN: 16.5 mg/dL (ref 7.0–26.0)
CO2: 27 mEq/L (ref 22–29)
Calcium: 9.2 mg/dL (ref 8.4–10.4)
Chloride: 104 mEq/L (ref 98–107)
Glucose: 102 mg/dl — ABNORMAL HIGH (ref 70–99)
Potassium: 3.7 mEq/L (ref 3.5–5.1)

## 2012-12-10 MED ORDER — BORTEZOMIB CHEMO SQ INJECTION 3.5 MG (2.5MG/ML)
1.3000 mg/m2 | Freq: Once | INTRAMUSCULAR | Status: AC
  Administered 2012-12-10: 2.5 mg via SUBCUTANEOUS
  Filled 2012-12-10: qty 2.5

## 2012-12-10 MED ORDER — ONDANSETRON HCL 8 MG PO TABS
8.0000 mg | ORAL_TABLET | Freq: Once | ORAL | Status: AC
  Administered 2012-12-10: 8 mg via ORAL

## 2012-12-10 NOTE — Patient Instructions (Addendum)
[facility] Discharge Instructions for Patients Receiving Chemotherapy  Today you received the following chemotherapy agents: Velcade. To help prevent nausea and vomiting after your treatment, we encourage you to take your nausea medication.  If you develop nausea and vomiting that is not controlled by your nausea medication, call the clinic.   BELOW ARE SYMPTOMS THAT SHOULD BE REPORTED IMMEDIATELY:  *FEVER GREATER THAN 100.5 F  *CHILLS WITH OR WITHOUT FEVER  NAUSEA AND VOMITING THAT IS NOT CONTROLLED WITH YOUR NAUSEA MEDICATION  *UNUSUAL SHORTNESS OF BREATH  *UNUSUAL BRUISING OR BLEEDING  TENDERNESS IN MOUTH AND THROAT WITH OR WITHOUT PRESENCE OF ULCERS  *URINARY PROBLEMS  *BOWEL PROBLEMS  UNUSUAL RASH Items with * indicate a potential emergency and should be followed up as soon as possible.  Feel free to call the clinic you have any questions or concerns. The clinic phone number is [phone].    

## 2012-12-11 ENCOUNTER — Telehealth: Payer: Self-pay | Admitting: Medical Oncology

## 2012-12-11 NOTE — Telephone Encounter (Signed)
Mailed appts to pt

## 2012-12-14 ENCOUNTER — Other Ambulatory Visit: Payer: Self-pay | Admitting: Internal Medicine

## 2012-12-16 ENCOUNTER — Other Ambulatory Visit: Payer: Self-pay | Admitting: Internal Medicine

## 2012-12-17 ENCOUNTER — Other Ambulatory Visit: Payer: Self-pay | Admitting: Internal Medicine

## 2012-12-17 ENCOUNTER — Ambulatory Visit (HOSPITAL_BASED_OUTPATIENT_CLINIC_OR_DEPARTMENT_OTHER): Payer: Commercial Managed Care - HMO

## 2012-12-17 ENCOUNTER — Other Ambulatory Visit (HOSPITAL_BASED_OUTPATIENT_CLINIC_OR_DEPARTMENT_OTHER): Payer: Medicare HMO | Admitting: Lab

## 2012-12-17 ENCOUNTER — Other Ambulatory Visit: Payer: Medicare HMO | Admitting: Lab

## 2012-12-17 VITALS — BP 103/65 | HR 69 | Temp 98.9°F

## 2012-12-17 DIAGNOSIS — C9000 Multiple myeloma not having achieved remission: Secondary | ICD-10-CM

## 2012-12-17 DIAGNOSIS — C9001 Multiple myeloma in remission: Secondary | ICD-10-CM

## 2012-12-17 DIAGNOSIS — R109 Unspecified abdominal pain: Secondary | ICD-10-CM

## 2012-12-17 DIAGNOSIS — Z5112 Encounter for antineoplastic immunotherapy: Secondary | ICD-10-CM

## 2012-12-17 LAB — CBC WITH DIFFERENTIAL/PLATELET
BASO%: 0.6 % (ref 0.0–2.0)
Basophils Absolute: 0 10*3/uL (ref 0.0–0.1)
EOS%: 1.1 % (ref 0.0–7.0)
HGB: 10.6 g/dL — ABNORMAL LOW (ref 11.6–15.9)
MCH: 33.4 pg (ref 25.1–34.0)
MONO#: 0.3 10*3/uL (ref 0.1–0.9)
NEUT#: 3.9 10*3/uL (ref 1.5–6.5)
RDW: 15 % — ABNORMAL HIGH (ref 11.2–14.5)
WBC: 5.2 10*3/uL (ref 3.9–10.3)
lymph#: 0.9 10*3/uL (ref 0.9–3.3)

## 2012-12-17 LAB — COMPREHENSIVE METABOLIC PANEL (CC13)
ALT: 16 U/L (ref 0–55)
AST: 18 U/L (ref 5–34)
Albumin: 3.1 g/dL — ABNORMAL LOW (ref 3.5–5.0)
BUN: 12.9 mg/dL (ref 7.0–26.0)
Calcium: 9.2 mg/dL (ref 8.4–10.4)
Chloride: 105 mEq/L (ref 98–107)
Potassium: 3.9 mEq/L (ref 3.5–5.1)

## 2012-12-17 MED ORDER — SODIUM CHLORIDE 0.9 % IJ SOLN
10.0000 mL | INTRAMUSCULAR | Status: DC | PRN
  Administered 2012-12-17: 10 mL via INTRAVENOUS
  Filled 2012-12-17: qty 10

## 2012-12-17 MED ORDER — ONDANSETRON HCL 8 MG PO TABS
8.0000 mg | ORAL_TABLET | Freq: Once | ORAL | Status: AC
  Administered 2012-12-17: 8 mg via ORAL

## 2012-12-17 MED ORDER — SODIUM CHLORIDE 0.9 % IV SOLN
Freq: Once | INTRAVENOUS | Status: AC
  Administered 2012-12-17: 11:00:00 via INTRAVENOUS

## 2012-12-17 MED ORDER — ZOLEDRONIC ACID 4 MG/100ML IV SOLN
4.0000 mg | Freq: Once | INTRAVENOUS | Status: AC
  Administered 2012-12-17: 4 mg via INTRAVENOUS
  Filled 2012-12-17: qty 100

## 2012-12-17 MED ORDER — BORTEZOMIB CHEMO SQ INJECTION 3.5 MG (2.5MG/ML)
1.3000 mg/m2 | Freq: Once | INTRAMUSCULAR | Status: AC
  Administered 2012-12-17: 2.5 mg via SUBCUTANEOUS
  Filled 2012-12-17: qty 2.5

## 2012-12-17 MED ORDER — HEPARIN SOD (PORK) LOCK FLUSH 100 UNIT/ML IV SOLN
500.0000 [IU] | Freq: Once | INTRAVENOUS | Status: AC
  Administered 2012-12-17: 500 [IU] via INTRAVENOUS
  Filled 2012-12-17: qty 5

## 2012-12-17 NOTE — Patient Instructions (Addendum)
[facility] Discharge Instructions for Patients Receiving Chemotherapy  Today you received the following chemotherapy agents velcade.  To help prevent nausea and vomiting after your treatment, we encourage you to take your nausea medication zofran.   If you develop nausea and vomiting that is not controlled by your nausea medication, call the clinic.   BELOW ARE SYMPTOMS THAT SHOULD BE REPORTED IMMEDIATELY:  *FEVER GREATER THAN 100.5 F  *CHILLS WITH OR WITHOUT FEVER  NAUSEA AND VOMITING THAT IS NOT CONTROLLED WITH YOUR NAUSEA MEDICATION  *UNUSUAL SHORTNESS OF BREATH  *UNUSUAL BRUISING OR BLEEDING  TENDERNESS IN MOUTH AND THROAT WITH OR WITHOUT PRESENCE OF ULCERS  *URINARY PROBLEMS  *BOWEL PROBLEMS  UNUSUAL RASH Items with * indicate a potential emergency and should be followed up as soon as possible.  Feel free to call the clinic you have any questions or concerns. The clinic phone number is [phone].   Zoledronic Acid injection (Hypercalcemia, Oncology) What is this medicine? ZOLEDRONIC ACID (ZOE le dron ik AS id) lowers the amount of calcium loss from bone. It is used to treat too much calcium in your blood from cancer. It is also used to prevent complications of cancer that has spread to the bone. This medicine may be used for other purposes; ask your health care provider or pharmacist if you have questions. What should I tell my health care provider before I take this medicine? They need to know if you have any of these conditions: -aspirin-sensitive asthma -dental disease -kidney disease -an unusual or allergic reaction to zoledronic acid, other medicines, foods, dyes, or preservatives -pregnant or trying to get pregnant -breast-feeding How should I use this medicine? This medicine is for infusion into a vein. It is given by a health care professional in a hospital or clinic setting. Talk to your pediatrician regarding the use of this medicine  in children. Special care may be needed. Overdosage: If you think you have taken too much of this medicine contact a poison control center or emergency room at once. NOTE: This medicine is only for you. Do not share this medicine with others. What if I miss a dose? It is important not to miss your dose. Call your doctor or health care professional if you are unable to keep an appointment. What may interact with this medicine? -certain antibiotics given by injection -NSAIDs, medicines for pain and inflammation, like ibuprofen or naproxen -some diuretics like bumetanide, furosemide -teriparatide -thalidomide This list may not describe all possible interactions. Give your health care provider a list of all the medicines, herbs, non-prescription drugs, or dietary supplements you use. Also tell them if you smoke, drink alcohol, or use illegal drugs. Some items may interact with your medicine. What should I watch for while using this medicine? Visit your doctor or health care professional for regular checkups. It may be some time before you see the benefit from this medicine. Do not stop taking your medicine unless your doctor tells you to. Your doctor may order blood tests or other tests to see how you are doing. Women should inform their doctor if they wish to become pregnant or think they might be pregnant. There is a potential for serious side effects to an unborn child. Talk to your health care professional or pharmacist for more information. You should make sure that you get enough calcium and vitamin D while you are taking this medicine. Discuss the foods you eat and the vitamins you take with your health care professional.  Some people who take this medicine have severe bone, joint, and/or muscle pain. This medicine may also increase your risk for a broken thigh bone. Tell your doctor right away if you have pain in your upper leg or groin. Tell your doctor if you have any pain that does not go away  or that gets worse. What side effects may I notice from receiving this medicine? Side effects that you should report to your doctor or health care professional as soon as possible: -allergic reactions like skin rash, itching or hives, swelling of the face, lips, or tongue -anxiety, confusion, or depression -breathing problems -changes in vision -feeling faint or lightheaded, falls -jaw burning, cramping, pain -muscle cramps, stiffness, or weakness -trouble passing urine or change in the amount of urine Side effects that usually do not require medical attention (report to your doctor or health care professional if they continue or are bothersome): -bone, joint, or muscle pain -fever -hair loss -irritation at site where injected -loss of appetite -nausea, vomiting -stomach upset -tired This list may not describe all possible side effects. Call your doctor for medical advice about side effects. You may report side effects to FDA at 1-800-FDA-1088. Where should I keep my medicine? This drug is given in a hospital or clinic and will not be stored at home. NOTE: This sheet is a summary. It may not cover all possible information. If you have questions about this medicine, talk to your doctor, pharmacist, or health care provider.  2012, [employer]. (12/14/2010 9:06:58 AM)

## 2012-12-18 ENCOUNTER — Telehealth: Payer: Self-pay | Admitting: *Deleted

## 2012-12-18 NOTE — Telephone Encounter (Signed)
Lab work Dr Dr Kathryne Sharper office from 12/16/2012 given to Dr Donnald Garre to review.  SLJ

## 2012-12-23 ENCOUNTER — Ambulatory Visit
Admission: RE | Admit: 2012-12-23 | Discharge: 2012-12-23 | Disposition: A | Discharge status: Home / Self Care | Payer: Medicare HMO | Source: Ambulatory Visit | Attending: Internal Medicine | Admitting: Internal Medicine

## 2012-12-23 DIAGNOSIS — R109 Unspecified abdominal pain: Secondary | ICD-10-CM

## 2012-12-23 MED ORDER — IOHEXOL 300 MG/ML  SOLN
100.0000 mL | Freq: Once | INTRAMUSCULAR | Status: AC | PRN
  Administered 2012-12-23: 100 mL via INTRAVENOUS

## 2012-12-24 ENCOUNTER — Other Ambulatory Visit (HOSPITAL_BASED_OUTPATIENT_CLINIC_OR_DEPARTMENT_OTHER): Payer: Medicare HMO | Admitting: Lab

## 2012-12-24 ENCOUNTER — Telehealth: Payer: Self-pay | Admitting: *Deleted

## 2012-12-24 ENCOUNTER — Ambulatory Visit (HOSPITAL_BASED_OUTPATIENT_CLINIC_OR_DEPARTMENT_OTHER): Payer: Medicare HMO

## 2012-12-24 ENCOUNTER — Telehealth: Payer: Self-pay | Admitting: Internal Medicine

## 2012-12-24 ENCOUNTER — Other Ambulatory Visit: Payer: Self-pay | Admitting: Medical Oncology

## 2012-12-24 ENCOUNTER — Ambulatory Visit (HOSPITAL_BASED_OUTPATIENT_CLINIC_OR_DEPARTMENT_OTHER): Payer: Medicare HMO | Admitting: Internal Medicine

## 2012-12-24 VITALS — BP 127/74 | HR 91 | Temp 97.1°F | Resp 18 | Ht 66.0 in | Wt 188.6 lb

## 2012-12-24 DIAGNOSIS — C9002 Multiple myeloma in relapse: Secondary | ICD-10-CM

## 2012-12-24 DIAGNOSIS — C9000 Multiple myeloma not having achieved remission: Secondary | ICD-10-CM

## 2012-12-24 DIAGNOSIS — Z5112 Encounter for antineoplastic immunotherapy: Secondary | ICD-10-CM

## 2012-12-24 LAB — COMPREHENSIVE METABOLIC PANEL (CC13)
ALT: 16 U/L (ref 0–55)
AST: 13 U/L (ref 5–34)
Albumin: 3.3 g/dL — ABNORMAL LOW (ref 3.5–5.0)
BUN: 17.2 mg/dL (ref 7.0–26.0)
CO2: 20 mEq/L — ABNORMAL LOW (ref 22–29)
Calcium: 8.8 mg/dL (ref 8.4–10.4)
Chloride: 107 mEq/L (ref 98–109)
Creatinine: 1 mg/dL (ref 0.6–1.1)
Potassium: 3.5 mEq/L (ref 3.5–5.1)

## 2012-12-24 LAB — CBC WITH DIFFERENTIAL/PLATELET
Basophils Absolute: 0 10*3/uL (ref 0.0–0.1)
Eosinophils Absolute: 0 10*3/uL (ref 0.0–0.5)
HCT: 35.4 % (ref 34.8–46.6)
HGB: 11.8 g/dL (ref 11.6–15.9)
MONO#: 0.2 10*3/uL (ref 0.1–0.9)
NEUT#: 7.5 10*3/uL — ABNORMAL HIGH (ref 1.5–6.5)
NEUT%: 87.5 % — ABNORMAL HIGH (ref 38.4–76.8)
RDW: 14.6 % — ABNORMAL HIGH (ref 11.2–14.5)
WBC: 8.6 10*3/uL (ref 3.9–10.3)
lymph#: 0.9 10*3/uL (ref 0.9–3.3)

## 2012-12-24 MED ORDER — ONDANSETRON HCL 8 MG PO TABS
8.0000 mg | ORAL_TABLET | Freq: Once | ORAL | Status: AC
Start: 2012-12-24 — End: 2012-12-24
  Administered 2012-12-24: 8 mg via ORAL

## 2012-12-24 MED ORDER — HYDROCODONE-ACETAMINOPHEN 7.5-325 MG PO TABS: 1.0000 | ORAL_TABLET | Freq: Four times a day (QID) | ORAL | Status: DC | PRN

## 2012-12-24 MED ORDER — BORTEZOMIB CHEMO SQ INJECTION 3.5 MG (2.5MG/ML)
1.3000 mg/m2 | Freq: Once | INTRAMUSCULAR | Status: AC
  Administered 2012-12-24: 2.5 mg via SUBCUTANEOUS
  Filled 2012-12-24: qty 2.5

## 2012-12-24 MED ORDER — MORPHINE SULFATE ER 15 MG PO TBCR: 15.0000 mg | EXTENDED_RELEASE_TABLET | Freq: Two times a day (BID) | ORAL | Status: DC

## 2012-12-24 NOTE — Progress Notes (Signed)
[facility] Telephone:([phone]) [phone]   Fax:([phone]) [phone]  OFFICE PROGRESS NOTE  [name], R, [name] [address] [place]. [address] [place] [address]  DIAGNOSIS: Recurrent multiple myeloma initially diagnosed in October 2008.   PRIOR THERAPY:  1. Status post palliative radiotherapy to the lumbar spine between L3 and L5. The patient received a total dose of 3000 cGy in 10 fractions under the care of Dr. Mitzi Hansen between May 05, 2007 through May 18, 2007. 2. Status post 5 cycles of systemic chemotherapy with Revlimid and low-dose Decadron with good response to this treatment. 3. Status post autologous peripheral blood stem cell transplant at [facility] on Nov 20, 2007 under the care of Dr. Vicente Serene. 4. Status post treatment for disease recurrence with Velcade, Doxil and Decadron. Last dose given Nov 09, 2009. Discontinued secondary to intolerance but the patient had a good response to treatment at that time. 5. Status post palliative radiotherapy to the T2-T6 thoracic vertebrae completed 03/21/2011 under the care of Dr. Mitzi Hansen. 6. Systemic chemotherapy with Velcade 1.3 mg per meter squared given on days 1, 4, 8 and 11, and Doxil at 30 mg per meter squared given on day 4 in addition to Decadron status post 4 cycles, discontinued secondary to intolerance.   CURRENT THERAPY: systemic therapy with Velcade 1.3 mg/M2 subcutaneously in addition to Decadron 40 mg by mouth on a weekly basis, status post 5 cycles.   INTERVAL HISTORY: [name] 62 y.o. female returns to the clinic today for followup visit accompanied by her daughter and son. The patient continues to complain of generalized aching pain all over her body in addition to spasms. She was also seen recently by her primary care physician complaining of abdominal pain and abdominal CT scan showed no evidence for abnormalities to explain her pain. She is tolerating her treatment with Velcade and Decadron  fairly well. She denied having any significant nausea or vomiting. The patient denied having any weight loss or night sweats. She has no cough or hemoptysis.  MEDICAL HISTORY: Past Medical History  Diagnosis Date  . Thyroid disease Hypothyroidism  . Hypercholesterolemia   . Compression fracture 04/08/2007    pathologic compression fracture  . Hypothyroidism   . FHx: chemotherapy     s/p 5 cycle revlimid/low dose decadron,s/p velcade,doxil,decadron,  . Hx of radiation therapy 05/05/07-05/18/07,& 03/05/11-03/21/11-    l3&l5 in 2008, t2-t6 03/2011  . GERD (gastroesophageal reflux disease)   . Insomnia     associated with steroids  . Nausea   . Constipation     takes oxycontin,vicodin  . Hx of radiation therapy 05/05/2007 to 05/18/2007    palliative, L3-5  . 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 Hx of radiation therapy 03/05/2011 to 03/21/2011    palliative T2-T6, c-spine  . History of autologous stem cell transplant 11/20/2007    [facility], Dr Vicente Serene    ALLERGIES:  has No Known Allergies.  MEDICATIONS:  Current Outpatient Prescriptions  Medication Sig Dispense Refill  . ALPRAZolam (XANAX) 0.5 MG tablet Take 0.5 mg by mouth Twice daily as needed.      Marland Kitchen dexamethasone (DECADRON) 4 MG tablet 10 tablet by mouth on a weekly basis starting with the first cycle of chemotherapy.  80 tablet  2  . dicyclomine (BENTYL) 20 MG tablet Take 1 tablet by mouth 2 (two) times daily.      Marland Kitchen ESZOPICLONE 3 MG tablet Take 3 mg by mouth at bedtime. scheduled      .  gabapentin (NEURONTIN) 100 MG capsule Take 100 mg by mouth 3 (three) times daily.       . hydrochlorothiazide (HYDRODIURIL) 25 MG tablet Take 25 mg by mouth Daily.      Marland Kitchen HYDROcodone-acetaminophen (NORCO) 7.5-325 MG per tablet Take 1 tablet by mouth every 6 (six) hours as needed for pain.  30 tablet  0  . levothyroxine (SYNTHROID, LEVOTHROID) 100 MCG tablet Take 100 mcg by mouth daily.       Marland Kitchen lidocaine-prilocaine (EMLA) cream APPLY TO PORT 1 TO 2 HRS  PRIOR TO PROCEDURE  30 g  0  . loperamide (IMODIUM) 2 MG capsule Take 2 mg by mouth 4 (four) times daily as needed for diarrhea or loose stools.      Marland Kitchen morphine (MS CONTIN) 15 MG 12 hr tablet Take 1 tablet (15 mg total) by mouth 2 (two) times daily.  60 tablet  0  . Multiple Vitamin (MULITIVITAMIN WITH MINERALS) TABS Take 1 tablet by mouth daily.      . ondansetron (ZOFRAN-ODT) 4 MG disintegrating tablet Take 1 tablet by mouth every 8 (eight) hours as needed.      . pantoprazole (PROTONIX) 40 MG tablet TAKE 1 TABLET BY MOUTH EVERY DAY  30 tablet  3   No current facility-administered medications for this visit.   Facility-Administered Medications Ordered in Other Visits  Medication Dose Route Frequency Provider Last Rate Last Dose  . 0.9 %  sodium chloride infusion   Intravenous Once Si Gaul, MD      . ondansetron (ZOFRAN) IVPB 8 mg  8 mg Intravenous Once Si Gaul, MD      . sodium chloride 0.9 % injection 10 mL  10 mL Intracatheter PRN Si Gaul, MD        SURGICAL HISTORY:  Past Surgical History  Procedure Laterality Date  . Cholecystectomy    . Knee surgery    . Knee surgery    . Video bronchoscopy  07/30/2011    Procedure: VIDEO BRONCHOSCOPY WITHOUT FLUORO;  Surgeon: Barbaraann Share, MD;  Location: [facility];  Service: Cardiopulmonary;  Laterality: Bilateral;    REVIEW OF SYSTEMS:  A comprehensive review of systems was negative except for: Constitutional: positive for fatigue Musculoskeletal: positive for back pain and myalgias   PHYSICAL EXAMINATION: General appearance: alert, cooperative, fatigued and no distress Head: Normocephalic, without obvious abnormality, atraumatic Neck: no adenopathy Lymph nodes: Cervical, supraclavicular, and axillary nodes normal. Resp: clear to auscultation bilaterally Cardio: regular rate and rhythm, S1, S2 normal, no murmur, click, rub or gallop GI: soft, non-tender; bowel sounds normal; no masses,  no  organomegaly Extremities: extremities normal, atraumatic, no cyanosis or edema  ECOG PERFORMANCE STATUS: 1 - Symptomatic but completely ambulatory  Blood pressure 127/74, pulse 91, temperature 97.1 F (36.2 C), temperature source Oral, resp. rate 18, height 5\' 6"  (1.676 m), weight 188 lb 9.6 oz (85.548 kg).  LABORATORY DATA: Lab Results  Component Value Date   WBC 8.6 12/24/2012   HGB 11.8 12/24/2012   HCT 35.4 12/24/2012   MCV 94.9 12/24/2012   PLT 231 12/24/2012      Chemistry      Component Value Date/Time   NA 138 12/17/2012 1015   NA 139 05/31/2012 1600   K 3.9 12/17/2012 1015   K 3.4* 05/31/2012 1600   CL 105 12/17/2012 1015   CL 105 05/31/2012 1600   CO2 25 12/17/2012 1015   CO2 25 05/31/2012 1600   BUN 12.9 12/17/2012 1015  BUN 13 05/31/2012 1600   CREATININE 0.9 12/17/2012 1015   CREATININE 0.80 05/31/2012 1600      Component Value Date/Time   CALCIUM 9.2 12/17/2012 1015   CALCIUM 10.1 05/31/2012 1600   ALKPHOS 102 12/17/2012 1015   ALKPHOS 87 05/31/2012 1600   AST 18 12/17/2012 1015   AST 17 05/31/2012 1600   ALT 16 12/17/2012 1015   ALT 16 05/31/2012 1600   BILITOT 0.54 12/17/2012 1015   BILITOT 0.7 05/31/2012 1600       RADIOGRAPHIC STUDIES: Ct Abdomen Pelvis W Contrast  12/23/2012   *RADIOLOGY REPORT*  Clinical Data: Mid abdominal pain, nausea, constipation and diarrhea, 25 pounds weight gain, bilateral lower extremity feet swelling, history of multiple myeloma, post stem cell transplant, and chemotherapy  CT ABDOMEN AND PELVIS WITH CONTRAST  Technique:  Multidetector CT imaging of the abdomen and pelvis was performed following the standard protocol during bolus administration of intravenous contrast.  Contrast: OMNIPAQUE IOHEXOL 300 MG/ML  SOLN  Comparison: CT abdomen pelvis - 05/19/2012  Findings:  Normal hepatic contour.  No discrete hepatic lesions.  Post cholecystectomy.  No intra or extrahepatic biliary duct dilatation. No ascites.  There is symmetric enhancement and  excretion of the bilateral kidneys.  There is mild fetal lobulation involving the inferior pole left kidney.  No discrete renal lesions.  No definite renal stones on the post contrast examination. No urinary obstruction or perinephric stranding.  Grossly unchanged approximately 1.1 cm nodule within the crux of the left adrenal gland (image 23, series 2, coronal image 64, series 400).  Normal appearance of the right adrenal gland. Normal appearance of the pancreas and spleen.  Enteric contrast extends to the level of the rectum.  The bowel is normal in course and caliber without wall thickening or evidence of enteric obstruction.  Normal appearance of the appendix.  No pneumoperitoneum, pneumatosis or portal venous gas.  Normal caliber of the abdominal aorta.  There is scattered minimal atherosclerotic plaque involving the origin of the celiac and SMA, not definitely resulting in hemodynamically significant stenosis on this non CT examination.  The major branch vessels of the abdominal aorta appear patent.  No definite retroperitoneal, mesenteric, pelvic or inguinal lymphadenopathy.  Post hysterectomy.  No discrete adnexal lesion.  No free fluid in the pelvis.  Limited visualization of the lower thorax demonstrates grossly symmetric subpleural ground-glass atelectasis.  No focal airspace opacity.  No pleural effusion.  Normal heart size.  No pericardial effusion.  No acute osseous abnormalities. Redemonstrated moderate to severe compression deformity apparently involving the superior endplate of L4.  The Schmorl's node is again noted involving the anterior aspect of the superior endplate of the L2 vertebral body.  Sub centimeter (approximately 3 mm) lytic lesion involving the anterior aspect of the T12 vertebral body (image 19, series 2 as well as additional lytic lesions within the bilateral ileum (images 54 and 56) grossly unchanged.  IMPRESSION:  1. No explanation for patient's abdominal pain.  Specifically, no  evidence of enteric or urinary obstruction.  2.  Unchanged approximately 1.1 cm nodule within the crux of the left adrenal gland, incompletely evaluated but stable since the 05/2012 examination and is likely of benign etiology.  Further evaluation with abdominal MRI may be performed as clinically indicated.  3.  Scattered myelomatous bone lesions appear grossly unchanged.  4.  Grossly unchanged moderate to severe compression deformity of the L4 vertebral body.   Original Report Authenticated By: Tacey Ruiz, MD  ASSESSMENT AND PLAN: This is a very pleasant 62 years old [place] American female with recurrent multiple myeloma currently on systemic therapy with subcutaneous Velcade and Decadron status post 5 weekly doses. I recommended for the patient to continue her current treatment with the same dose and is scheduled for now. I would see her back for followup visit in 3 weeks for evaluation. She will continue on her current pain medication with MS Contin 15 mg by mouth every 12 hours in addition to Norco.  She was advised to call back if she has any concerning symptoms in the interval. I will repeat her myeloma panel after completion of 9 weekly doses of her treatment.  All questions were answered. The patient knows to call the clinic with any problems, questions or concerns. We can certainly see the patient much sooner if necessary.

## 2012-12-24 NOTE — Patient Instructions (Signed)

## 2012-12-24 NOTE — Telephone Encounter (Signed)
Per staff phone call and POF I have schedueld appts.  JMW  

## 2012-12-24 NOTE — Telephone Encounter (Signed)
Refill RX given to pt.

## 2012-12-25 ENCOUNTER — Encounter: Payer: Self-pay | Admitting: Internal Medicine

## 2012-12-25 NOTE — Patient Instructions (Signed)
Continue treatment with Velcade and Decadron. Followup visit in 3 weeks

## 2012-12-28 ENCOUNTER — Telehealth: Payer: Self-pay | Admitting: Internal Medicine

## 2012-12-28 NOTE — Telephone Encounter (Signed)
pt called to confirm appts.Marland KitchenMarland KitchenDone...mailed pt appt avs

## 2012-12-28 NOTE — Telephone Encounter (Signed)
Fax medical records to  [facility] for pt.

## 2012-12-31 ENCOUNTER — Ambulatory Visit: Payer: Medicare HMO

## 2012-12-31 ENCOUNTER — Ambulatory Visit (HOSPITAL_BASED_OUTPATIENT_CLINIC_OR_DEPARTMENT_OTHER): Payer: Medicare Other

## 2012-12-31 ENCOUNTER — Other Ambulatory Visit (HOSPITAL_BASED_OUTPATIENT_CLINIC_OR_DEPARTMENT_OTHER): Payer: Medicare Other | Admitting: Lab

## 2012-12-31 VITALS — BP 122/69 | HR 78 | Temp 99.3°F

## 2012-12-31 DIAGNOSIS — Z5112 Encounter for antineoplastic immunotherapy: Secondary | ICD-10-CM

## 2012-12-31 DIAGNOSIS — Z95828 Presence of other vascular implants and grafts: Secondary | ICD-10-CM

## 2012-12-31 DIAGNOSIS — C9002 Multiple myeloma in relapse: Secondary | ICD-10-CM

## 2012-12-31 DIAGNOSIS — C9000 Multiple myeloma not having achieved remission: Secondary | ICD-10-CM

## 2012-12-31 LAB — CBC WITH DIFFERENTIAL/PLATELET
Basophils Absolute: 0 10*3/uL (ref 0.0–0.1)
Eosinophils Absolute: 0 10*3/uL (ref 0.0–0.5)
HCT: 34.8 % (ref 34.8–46.6)
HGB: 11.4 g/dL — ABNORMAL LOW (ref 11.6–15.9)
MCH: 31.9 pg (ref 25.1–34.0)
MCV: 97.5 fL (ref 79.5–101.0)
MONO%: 7.6 % (ref 0.0–14.0)
NEUT#: 4 10*3/uL (ref 1.5–6.5)
NEUT%: 67.6 % (ref 38.4–76.8)
Platelets: 211 10*3/uL (ref 145–400)
RDW: 15.5 % — ABNORMAL HIGH (ref 11.2–14.5)

## 2012-12-31 LAB — COMPREHENSIVE METABOLIC PANEL (CC13)
Albumin: 3.3 g/dL — ABNORMAL LOW (ref 3.5–5.0)
Alkaline Phosphatase: 117 U/L (ref 40–150)
BUN: 15.1 mg/dL (ref 7.0–26.0)
Calcium: 8.8 mg/dL (ref 8.4–10.4)
Creatinine: 0.8 mg/dL (ref 0.6–1.1)
Glucose: 113 mg/dl (ref 70–140)
Potassium: 3.9 mEq/L (ref 3.5–5.1)

## 2012-12-31 MED ORDER — BORTEZOMIB CHEMO SQ INJECTION 3.5 MG (2.5MG/ML)
1.3000 mg/m2 | Freq: Once | INTRAMUSCULAR | Status: AC
  Administered 2012-12-31: 2.5 mg via SUBCUTANEOUS
  Filled 2012-12-31: qty 2.5

## 2012-12-31 MED ORDER — ONDANSETRON HCL 8 MG PO TABS
8.0000 mg | ORAL_TABLET | Freq: Once | ORAL | Status: AC
  Administered 2012-12-31: 8 mg via ORAL

## 2012-12-31 MED ORDER — HEPARIN SOD (PORK) LOCK FLUSH 100 UNIT/ML IV SOLN
500.0000 [IU] | Freq: Once | INTRAVENOUS | Status: AC
  Administered 2012-12-31: 500 [IU] via INTRAVENOUS
  Filled 2012-12-31: qty 5

## 2012-12-31 MED ORDER — SODIUM CHLORIDE 0.9 % IJ SOLN
10.0000 mL | INTRAMUSCULAR | Status: DC | PRN
  Administered 2012-12-31: 10 mL via INTRAVENOUS
  Filled 2012-12-31: qty 10

## 2012-12-31 NOTE — Progress Notes (Signed)
Duplicate appt. 

## 2012-12-31 NOTE — Patient Instructions (Signed)
[facility] Discharge Instructions for Patients Receiving Chemotherapy  Today you received the following chemotherapy agents: Velcade  To help prevent nausea and vomiting after your treatment, we encourage you to take your nausea medication as directed by your MD.   If you develop nausea and vomiting that is not controlled by your nausea medication, call the clinic.   BELOW ARE SYMPTOMS THAT SHOULD BE REPORTED IMMEDIATELY:  *FEVER GREATER THAN 100.5 F  *CHILLS WITH OR WITHOUT FEVER  NAUSEA AND VOMITING THAT IS NOT CONTROLLED WITH YOUR NAUSEA MEDICATION  *UNUSUAL SHORTNESS OF BREATH  *UNUSUAL BRUISING OR BLEEDING  TENDERNESS IN MOUTH AND THROAT WITH OR WITHOUT PRESENCE OF ULCERS  *URINARY PROBLEMS  *BOWEL PROBLEMS  UNUSUAL RASH Items with * indicate a potential emergency and should be followed up as soon as possible.  Feel free to call the clinic you have any questions or concerns. The clinic phone number is [phone].    

## 2013-01-07 ENCOUNTER — Ambulatory Visit: Payer: Medicare HMO | Admitting: Physician Assistant

## 2013-01-07 ENCOUNTER — Other Ambulatory Visit: Payer: Medicare HMO | Admitting: Lab

## 2013-01-07 ENCOUNTER — Ambulatory Visit (HOSPITAL_BASED_OUTPATIENT_CLINIC_OR_DEPARTMENT_OTHER): Payer: Medicare Other

## 2013-01-07 ENCOUNTER — Encounter: Payer: Self-pay | Admitting: Internal Medicine

## 2013-01-07 ENCOUNTER — Other Ambulatory Visit (HOSPITAL_BASED_OUTPATIENT_CLINIC_OR_DEPARTMENT_OTHER): Payer: Medicare Other | Admitting: Lab

## 2013-01-07 ENCOUNTER — Ambulatory Visit: Payer: Medicare HMO | Admitting: Internal Medicine

## 2013-01-07 ENCOUNTER — Ambulatory Visit (HOSPITAL_BASED_OUTPATIENT_CLINIC_OR_DEPARTMENT_OTHER): Payer: Medicare Other | Admitting: Internal Medicine

## 2013-01-07 VITALS — BP 118/73 | HR 82 | Temp 97.8°F | Resp 18 | Ht 66.0 in | Wt 196.6 lb

## 2013-01-07 DIAGNOSIS — C9000 Multiple myeloma not having achieved remission: Secondary | ICD-10-CM

## 2013-01-07 DIAGNOSIS — R159 Full incontinence of feces: Secondary | ICD-10-CM

## 2013-01-07 DIAGNOSIS — R5383 Other fatigue: Secondary | ICD-10-CM

## 2013-01-07 DIAGNOSIS — M25519 Pain in unspecified shoulder: Secondary | ICD-10-CM

## 2013-01-07 DIAGNOSIS — C9002 Multiple myeloma in relapse: Secondary | ICD-10-CM

## 2013-01-07 DIAGNOSIS — Z5112 Encounter for antineoplastic immunotherapy: Secondary | ICD-10-CM

## 2013-01-07 DIAGNOSIS — M549 Dorsalgia, unspecified: Secondary | ICD-10-CM

## 2013-01-07 LAB — CBC WITH DIFFERENTIAL/PLATELET
BASO%: 0.3 % (ref 0.0–2.0)
HCT: 31.2 % — ABNORMAL LOW (ref 34.8–46.6)
LYMPH%: 10 % — ABNORMAL LOW (ref 14.0–49.7)
MCH: 33.3 pg (ref 25.1–34.0)
MCHC: 34.5 g/dL (ref 31.5–36.0)
MONO#: 0.3 10*3/uL (ref 0.1–0.9)
NEUT%: 84.9 % — ABNORMAL HIGH (ref 38.4–76.8)
Platelets: 193 10*3/uL (ref 145–400)
WBC: 7.8 10*3/uL (ref 3.9–10.3)

## 2013-01-07 LAB — COMPREHENSIVE METABOLIC PANEL (CC13)
ALT: 16 U/L (ref 0–55)
CO2: 22 mEq/L (ref 22–29)
Creatinine: 0.9 mg/dL (ref 0.6–1.1)
Total Bilirubin: 0.42 mg/dL (ref 0.20–1.20)

## 2013-01-07 MED ORDER — ONDANSETRON HCL 8 MG PO TABS
8.0000 mg | ORAL_TABLET | Freq: Once | ORAL | Status: AC
  Administered 2013-01-07: 8 mg via ORAL

## 2013-01-07 MED ORDER — BORTEZOMIB CHEMO SQ INJECTION 3.5 MG (2.5MG/ML)
1.3000 mg/m2 | Freq: Once | INTRAMUSCULAR | Status: AC
  Administered 2013-01-07: 2.5 mg via SUBCUTANEOUS
  Filled 2013-01-07: qty 2.5

## 2013-01-07 NOTE — Progress Notes (Signed)
[name] [phone]   [phone]  OFFICE PROGRESS NOTE  [name], [name], [name] [address] [place]. [address] [place] [address]  DIAGNOSIS: Recurrent multiple myeloma initially diagnosed in October 2008.   PRIOR THERAPY:  1. Status post palliative radiotherapy to the lumbar spine between L3 and L5. The patient received a total dose of 3000 cGy in 10 fractions under the care of Dr. Mitzi Hansen between May 05, 2007 through May 18, 2007. 2. Status post 5 cycles of systemic chemotherapy with Revlimid and low-dose Decadron with good response to this treatment. 3. Status post autologous peripheral blood stem cell transplant at [name] on Nov 20, 2007 under the care of Dr. Vicente Serene. 4. Status post treatment for disease recurrence with Velcade, Doxil and Decadron. Last dose given Nov 09, 2009. Discontinued secondary to intolerance but the patient had a good response to treatment at that time. 5. Status post palliative radiotherapy to the T2-T6 thoracic vertebrae completed 03/21/2011 under the care of Dr. Mitzi Hansen. 6. Systemic chemotherapy with Velcade 1.3 mg per meter squared given on days 1, 4, 8 and 11, and Doxil at 30 mg per meter squared given on day 4 in addition to Decadron status post 4 cycles, discontinued secondary to intolerance.   CURRENT THERAPY: systemic therapy with Velcade 1.3 mg/M2 subcutaneously in addition to Decadron 40 mg by mouth on a weekly basis, status post 7 cycles.   INTERVAL HISTORY: [name] 62 y.o. female returns to the clinic today for followup visit accompanied her daughter. The patient still complaining of increasing fatigue and weakness as well as left shoulder pain and recent bowel incontinence and weakness in her lower extremities. She is tolerating her treatment fairly well with no significant adverse effects. She denied having any fever or chills. She denied having any weight loss or night sweats. The  patient has no nausea or vomiting. She has no significant chest pain, shortness breath, cough or hemoptysis.  MEDICAL HISTORY: Past Medical History  Diagnosis Date  . Thyroid disease Hypothyroidism  . Hypercholesterolemia   . Compression fracture 04/08/2007    pathologic compression fracture  . Hypothyroidism   . FHx: chemotherapy     s/p 5 cycle revlimid/low dose decadron,s/p velcade,doxil,decadron,  . Hx of radiation therapy 05/05/07-05/18/07,& 03/05/11-03/21/11-    l3&l5 in 2008, t2-t6 03/2011  . GERD (gastroesophageal reflux disease)   . Insomnia     associated with steroids  . Nausea   . Constipation     takes oxycontin,vicodin  . Hx of radiation therapy 05/05/2007 to 05/18/2007    palliative, L3-5  . 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 Hx of radiation therapy 03/05/2011 to 03/21/2011    palliative T2-T6, c-spine  . History of autologous stem cell transplant 11/20/2007    [facility], Dr Vicente Serene    ALLERGIES:  has No Known Allergies.  MEDICATIONS:  Current Outpatient Prescriptions  Medication Sig Dispense Refill  . ALPRAZolam (XANAX) 0.5 MG tablet Take 0.5 mg by mouth Twice daily as needed.      Marland Kitchen dexamethasone (DECADRON) 4 MG tablet 10 tablet by mouth on a weekly basis starting with the first cycle of chemotherapy.  80 tablet  2  . dicyclomine (BENTYL) 20 MG tablet Take 1 tablet by mouth 2 (two) times daily.      Marland Kitchen ESZOPICLONE 3 MG tablet Take 3 mg by mouth at bedtime. scheduled      . gabapentin (NEURONTIN) 100 MG capsule  Take 100 mg by mouth 3 (three) times daily.       . hydrochlorothiazide (HYDRODIURIL) 25 MG tablet Take 25 mg by mouth Daily.      Marland Kitchen HYDROcodone-acetaminophen (NORCO) 7.5-325 MG per tablet Take 1 tablet by mouth every 6 (six) hours as needed for pain.  30 tablet  0  . levothyroxine (SYNTHROID, LEVOTHROID) 100 MCG tablet Take 100 mcg by mouth daily.       Marland Kitchen lidocaine-prilocaine (EMLA) cream APPLY TO PORT 1 TO 2 HRS PRIOR TO PROCEDURE  30 g  0  . loperamide (IMODIUM)  2 MG capsule Take 2 mg by mouth 4 (four) times daily as needed for diarrhea or loose stools.      Marland Kitchen morphine (MS CONTIN) 15 MG 12 hr tablet Take 1 tablet (15 mg total) by mouth 2 (two) times daily.  60 tablet  0  . Multiple Vitamin (MULITIVITAMIN WITH MINERALS) TABS Take 1 tablet by mouth daily.      . ondansetron (ZOFRAN-ODT) 4 MG disintegrating tablet Take 1 tablet by mouth every 8 (eight) hours as needed.      . pantoprazole (PROTONIX) 40 MG tablet TAKE 1 TABLET BY MOUTH EVERY DAY  30 tablet  3   No current facility-administered medications for this visit.   Facility-Administered Medications Ordered in Other Visits  Medication Dose Route Frequency Provider Last Rate Last Dose  . 0.9 %  sodium chloride infusion   Intravenous Once Si Gaul, MD      . ondansetron (ZOFRAN) IVPB 8 mg  8 mg Intravenous Once Si Gaul, MD      . sodium chloride 0.9 % injection 10 mL  10 mL Intracatheter PRN Si Gaul, MD        SURGICAL HISTORY:  Past Surgical History  Procedure Laterality Date  . Cholecystectomy    . Knee surgery    . Knee surgery    . Video bronchoscopy  07/30/2011    Procedure: VIDEO BRONCHOSCOPY WITHOUT FLUORO;  Surgeon: Barbaraann Share, MD;  Location: [facility];  Service: Cardiopulmonary;  Laterality: Bilateral;    REVIEW OF SYSTEMS:  A comprehensive review of systems was negative except for: Constitutional: positive for fatigue Gastrointestinal: positive for change in bowel habits and Few episodes of bowel incontinence Musculoskeletal: positive for back pain and muscle weakness Neurological: positive for paresthesia   PHYSICAL EXAMINATION: General appearance: alert, cooperative, fatigued and no distress Head: Normocephalic, without obvious abnormality, atraumatic Neck: no adenopathy Lymph nodes: Cervical, supraclavicular, and axillary nodes normal. Resp: clear to auscultation bilaterally Cardio: regular rate and rhythm, S1, S2 normal, no murmur, click, rub or  gallop GI: soft, non-tender; bowel sounds normal; no masses,  no organomegaly Extremities: extremities normal, atraumatic, no cyanosis or edema Neurologic: Alert and oriented X 3, normal strength and tone. Normal symmetric reflexes. Normal coordination and gait  ECOG PERFORMANCE STATUS: 1 - Symptomatic but completely ambulatory  There were no vitals taken for this visit.  LABORATORY DATA: Lab Results  Component Value Date   WBC 7.8 01/07/2013   HGB 10.8* 01/07/2013   HCT 31.2* 01/07/2013   MCV 96.5 01/07/2013   PLT 193 01/07/2013      Chemistry      Component Value Date/Time   NA 136 12/31/2012 1036   NA 139 05/31/2012 1600   K 3.9 12/31/2012 1036   K 3.4* 05/31/2012 1600   CL 105 12/17/2012 1015   CL 105 05/31/2012 1600   CO2 26 12/31/2012 1036   CO2 25  05/31/2012 1600   BUN 15.1 12/31/2012 1036   BUN 13 05/31/2012 1600   CREATININE 0.8 12/31/2012 1036   CREATININE 0.80 05/31/2012 1600      Component Value Date/Time   CALCIUM 8.8 12/31/2012 1036   CALCIUM 10.1 05/31/2012 1600   ALKPHOS 117 12/31/2012 1036   ALKPHOS 87 05/31/2012 1600   AST 12 12/31/2012 1036   AST 17 05/31/2012 1600   ALT 15 12/31/2012 1036   ALT 16 05/31/2012 1600   BILITOT 0.48 12/31/2012 1036   BILITOT 0.7 05/31/2012 1600       RADIOGRAPHIC STUDIES: Ct Abdomen Pelvis W Contrast  12/23/2012   *RADIOLOGY REPORT*  Clinical Data: Mid abdominal pain, nausea, constipation and diarrhea, 25 pounds weight gain, bilateral lower extremity feet swelling, history of multiple myeloma, post stem cell transplant, and chemotherapy  CT ABDOMEN AND PELVIS WITH CONTRAST  Technique:  Multidetector CT imaging of the abdomen and pelvis was performed following the standard protocol during bolus administration of intravenous contrast.  Contrast: OMNIPAQUE IOHEXOL 300 MG/ML  SOLN  Comparison: CT abdomen pelvis - 05/19/2012  Findings:  Normal hepatic contour.  No discrete hepatic lesions.  Post cholecystectomy.  No intra or extrahepatic biliary duct  dilatation. No ascites.  There is symmetric enhancement and excretion of the bilateral kidneys.  There is mild fetal lobulation involving the inferior pole left kidney.  No discrete renal lesions.  No definite renal stones on the post contrast examination. No urinary obstruction or perinephric stranding.  Grossly unchanged approximately 1.1 cm nodule within the crux of the left adrenal gland (image 23, series 2, coronal image 64, series 400).  Normal appearance of the right adrenal gland. Normal appearance of the pancreas and spleen.  Enteric contrast extends to the level of the rectum.  The bowel is normal in course and caliber without wall thickening or evidence of enteric obstruction.  Normal appearance of the appendix.  No pneumoperitoneum, pneumatosis or portal venous gas.  Normal caliber of the abdominal aorta.  There is scattered minimal atherosclerotic plaque involving the origin of the celiac and SMA, not definitely resulting in hemodynamically significant stenosis on this non CT examination.  The major branch vessels of the abdominal aorta appear patent.  No definite retroperitoneal, mesenteric, pelvic or inguinal lymphadenopathy.  Post hysterectomy.  No discrete adnexal lesion.  No free fluid in the pelvis.  Limited visualization of the lower thorax demonstrates grossly symmetric subpleural ground-glass atelectasis.  No focal airspace opacity.  No pleural effusion.  Normal heart size.  No pericardial effusion.  No acute osseous abnormalities. Redemonstrated moderate to severe compression deformity apparently involving the superior endplate of L4.  The Schmorl's node is again noted involving the anterior aspect of the superior endplate of the L2 vertebral body.  Sub centimeter (approximately 3 mm) lytic lesion involving the anterior aspect of the T12 vertebral body (image 19, series 2 as well as additional lytic lesions within the bilateral ileum (images 54 and 56) grossly unchanged.  IMPRESSION:  1. No  explanation for patient's abdominal pain.  Specifically, no evidence of enteric or urinary obstruction.  2.  Unchanged approximately 1.1 cm nodule within the crux of the left adrenal gland, incompletely evaluated but stable since the 05/2012 examination and is likely of benign etiology.  Further evaluation with abdominal MRI may be performed as clinically indicated.  3.  Scattered myelomatous bone lesions appear grossly unchanged.  4.  Grossly unchanged moderate to severe compression deformity of the L4 vertebral body.  Original Report Authenticated By: Tacey Ruiz, MD    ASSESSMENT AND PLAN: This is a very pleasant 62 years old African American female with recurrent multiple myeloma currently undergoing systemic therapy with subcutaneous Velcade and Decadron and tolerating her treatment fairly well. The patient about persistent pain in the left shoulder as well as the back pain with bowel incontinence. I recommended for her to continue her current treatment with Velcade and Decadron as scheduled. For the bowel incontinence, I will order the MRI of the lumbar spine to rule out any metastatic disease in this area. For the left shoulder pain, I will order x-ray of the left shoulder to rule out any lytic lesion that require palliative radiotherapy. She would come back for followup visit in 2 weeks with repeat myeloma panel for reevaluation of her disease. She would continue her current pain medication for now. She was advised to call immediately if she has any concerning symptoms in the interval.  All questions were answered. The patient knows to call the clinic with any problems, questions or concerns. We can certainly see the patient much sooner if necessary.  I spent 15 minutes counseling the patient face to face. The total time spent in the appointment was 25 minutes.

## 2013-01-07 NOTE — Patient Instructions (Signed)

## 2013-01-07 NOTE — Patient Instructions (Signed)
Continue her current treatment with Velcade and Decadron. MRI of the lumbar spine to evaluate the bowel incontinence. X-ray of the left shoulder. Followup visit in 2 weeks with repeat myeloma panel

## 2013-01-08 ENCOUNTER — Ambulatory Visit (HOSPITAL_COMMUNITY)
Admission: RE | Admit: 2013-01-08 | Discharge: 2013-01-08 | Disposition: A | Discharge status: Home / Self Care | Payer: Medicare Other | Source: Ambulatory Visit | Attending: Internal Medicine | Admitting: Internal Medicine

## 2013-01-08 ENCOUNTER — Telehealth: Payer: Self-pay | Admitting: Medical Oncology

## 2013-01-08 DIAGNOSIS — C9000 Multiple myeloma not having achieved remission: Secondary | ICD-10-CM

## 2013-01-08 DIAGNOSIS — R159 Full incontinence of feces: Secondary | ICD-10-CM

## 2013-01-08 DIAGNOSIS — M19019 Primary osteoarthritis, unspecified shoulder: Secondary | ICD-10-CM | POA: Insufficient documentation

## 2013-01-08 NOTE — Telephone Encounter (Signed)
radiology changed orders for Dr Arbutus Ped to sign

## 2013-01-11 ENCOUNTER — Ambulatory Visit (HOSPITAL_COMMUNITY)
Admission: RE | Admit: 2013-01-11 | Discharge: 2013-01-11 | Disposition: A | Discharge status: Home / Self Care | Payer: Medicare Other | Source: Ambulatory Visit | Attending: Internal Medicine | Admitting: Internal Medicine

## 2013-01-11 DIAGNOSIS — R159 Full incontinence of feces: Secondary | ICD-10-CM

## 2013-01-11 DIAGNOSIS — Z79899 Other long term (current) drug therapy: Secondary | ICD-10-CM | POA: Insufficient documentation

## 2013-01-11 DIAGNOSIS — C9000 Multiple myeloma not having achieved remission: Secondary | ICD-10-CM

## 2013-01-11 DIAGNOSIS — M5126 Other intervertebral disc displacement, lumbar region: Secondary | ICD-10-CM | POA: Insufficient documentation

## 2013-01-11 DIAGNOSIS — M129 Arthropathy, unspecified: Secondary | ICD-10-CM | POA: Insufficient documentation

## 2013-01-11 MED ORDER — GADOBENATE DIMEGLUMINE 529 MG/ML IV SOLN
17.0000 mL | Freq: Once | INTRAVENOUS | Status: AC | PRN
  Administered 2013-01-11: 17 mL via INTRAVENOUS

## 2013-01-14 ENCOUNTER — Ambulatory Visit (HOSPITAL_BASED_OUTPATIENT_CLINIC_OR_DEPARTMENT_OTHER): Payer: Medicare Other

## 2013-01-14 ENCOUNTER — Other Ambulatory Visit: Payer: Self-pay | Admitting: *Deleted

## 2013-01-14 ENCOUNTER — Other Ambulatory Visit (HOSPITAL_BASED_OUTPATIENT_CLINIC_OR_DEPARTMENT_OTHER): Payer: Medicare Other | Admitting: Lab

## 2013-01-14 VITALS — BP 114/71 | HR 78 | Temp 97.1°F | Resp 18

## 2013-01-14 DIAGNOSIS — C9002 Multiple myeloma in relapse: Secondary | ICD-10-CM

## 2013-01-14 DIAGNOSIS — C9000 Multiple myeloma not having achieved remission: Secondary | ICD-10-CM

## 2013-01-14 DIAGNOSIS — Z5112 Encounter for antineoplastic immunotherapy: Secondary | ICD-10-CM

## 2013-01-14 LAB — COMPREHENSIVE METABOLIC PANEL (CC13)
ALT: 14 U/L (ref 0–55)
AST: 14 U/L (ref 5–34)
CO2: 24 mEq/L (ref 22–29)
Creatinine: 0.8 mg/dL (ref 0.6–1.1)
Total Bilirubin: 0.63 mg/dL (ref 0.20–1.20)

## 2013-01-14 LAB — CBC WITH DIFFERENTIAL/PLATELET
BASO%: 0.1 % (ref 0.0–2.0)
LYMPH%: 7.7 % — ABNORMAL LOW (ref 14.0–49.7)
MCHC: 32.6 g/dL (ref 31.5–36.0)
MONO#: 0.3 10*3/uL (ref 0.1–0.9)
Platelets: 208 10*3/uL (ref 145–400)
RBC: 3.4 10*6/uL — ABNORMAL LOW (ref 3.70–5.45)
WBC: 6.9 10*3/uL (ref 3.9–10.3)

## 2013-01-14 MED ORDER — BORTEZOMIB CHEMO SQ INJECTION 3.5 MG (2.5MG/ML)
1.3000 mg/m2 | Freq: Once | INTRAMUSCULAR | Status: AC
  Administered 2013-01-14: 2.5 mg via SUBCUTANEOUS
  Filled 2013-01-14: qty 2.5

## 2013-01-14 MED ORDER — HEPARIN SOD (PORK) LOCK FLUSH 100 UNIT/ML IV SOLN
500.0000 [IU] | Freq: Once | INTRAVENOUS | Status: AC
  Administered 2013-01-14: 500 [IU] via INTRAVENOUS
  Filled 2013-01-14: qty 5

## 2013-01-14 MED ORDER — ONDANSETRON HCL 8 MG PO TABS
8.0000 mg | ORAL_TABLET | Freq: Once | ORAL | Status: AC
  Administered 2013-01-14: 8 mg via ORAL

## 2013-01-14 MED ORDER — SODIUM CHLORIDE 0.9 % IJ SOLN
10.0000 mL | INTRAMUSCULAR | Status: DC | PRN
  Administered 2013-01-14: 10 mL via INTRAVENOUS
  Filled 2013-01-14: qty 10

## 2013-01-14 NOTE — Patient Instructions (Signed)
[facility] Discharge Instructions for Patients Receiving Chemotherapy  Today you received the following chemotherapy agents: Velcade  To help prevent nausea and vomiting after your treatment, we encourage you to take your nausea medication as directed by your MD.   If you develop nausea and vomiting that is not controlled by your nausea medication, call the clinic.   BELOW ARE SYMPTOMS THAT SHOULD BE REPORTED IMMEDIATELY:  *FEVER GREATER THAN 100.5 F  *CHILLS WITH OR WITHOUT FEVER  NAUSEA AND VOMITING THAT IS NOT CONTROLLED WITH YOUR NAUSEA MEDICATION  *UNUSUAL SHORTNESS OF BREATH  *UNUSUAL BRUISING OR BLEEDING  TENDERNESS IN MOUTH AND THROAT WITH OR WITHOUT PRESENCE OF ULCERS  *URINARY PROBLEMS  *BOWEL PROBLEMS  UNUSUAL RASH Items with * indicate a potential emergency and should be followed up as soon as possible.  Feel free to call the clinic you have any questions or concerns. The clinic phone number is [phone].    

## 2013-01-14 NOTE — Progress Notes (Signed)
Per pt request, most recent scans faxed to Dr Lacretia Leigh office.  Dr Donnald Garre aware.  SLJ

## 2013-01-14 NOTE — Progress Notes (Signed)
Pt asked about MRI, x-ray results.  Per Dr. Arbutus Ped, pt has no areas of major concern and nothing major going on, and he will discuss in detail at next visit.  Pt currently does not have appt with MD scheduled.  Informed desk RN, who will notify scheduling.  Informed pt she will be contacted with appt for MD.

## 2013-01-15 ENCOUNTER — Other Ambulatory Visit: Payer: Self-pay | Admitting: Internal Medicine

## 2013-01-19 ENCOUNTER — Ambulatory Visit (HOSPITAL_COMMUNITY)
Admission: RE | Admit: 2013-01-19 | Discharge: 2013-01-19 | Disposition: A | Discharge status: Home / Self Care | Payer: Medicare Other | Source: Ambulatory Visit | Attending: Internal Medicine | Admitting: Internal Medicine

## 2013-01-19 ENCOUNTER — Other Ambulatory Visit (HOSPITAL_COMMUNITY): Payer: Self-pay | Admitting: Internal Medicine

## 2013-01-19 DIAGNOSIS — R0602 Shortness of breath: Secondary | ICD-10-CM

## 2013-01-19 DIAGNOSIS — Z9089 Acquired absence of other organs: Secondary | ICD-10-CM | POA: Insufficient documentation

## 2013-01-19 DIAGNOSIS — R05 Cough: Secondary | ICD-10-CM | POA: Insufficient documentation

## 2013-01-19 DIAGNOSIS — R0989 Other specified symptoms and signs involving the circulatory and respiratory systems: Secondary | ICD-10-CM | POA: Insufficient documentation

## 2013-01-19 DIAGNOSIS — R059 Cough, unspecified: Secondary | ICD-10-CM | POA: Insufficient documentation

## 2013-01-21 ENCOUNTER — Other Ambulatory Visit: Payer: Self-pay | Admitting: Medical Oncology

## 2013-01-21 ENCOUNTER — Telehealth: Payer: Self-pay | Admitting: Internal Medicine

## 2013-01-21 ENCOUNTER — Other Ambulatory Visit (HOSPITAL_BASED_OUTPATIENT_CLINIC_OR_DEPARTMENT_OTHER): Payer: Medicare Other | Admitting: Lab

## 2013-01-21 ENCOUNTER — Ambulatory Visit (HOSPITAL_BASED_OUTPATIENT_CLINIC_OR_DEPARTMENT_OTHER): Payer: Medicare Other

## 2013-01-21 VITALS — BP 118/79 | HR 78 | Temp 98.4°F | Resp 20

## 2013-01-21 DIAGNOSIS — C9000 Multiple myeloma not having achieved remission: Secondary | ICD-10-CM

## 2013-01-21 DIAGNOSIS — Z5112 Encounter for antineoplastic immunotherapy: Secondary | ICD-10-CM

## 2013-01-21 DIAGNOSIS — B029 Zoster without complications: Secondary | ICD-10-CM

## 2013-01-21 DIAGNOSIS — C9002 Multiple myeloma in relapse: Secondary | ICD-10-CM

## 2013-01-21 DIAGNOSIS — R159 Full incontinence of feces: Secondary | ICD-10-CM

## 2013-01-21 LAB — CBC WITH DIFFERENTIAL/PLATELET
Eosinophils Absolute: 0 10*3/uL (ref 0.0–0.5)
HCT: 36.2 % (ref 34.8–46.6)
LYMPH%: 18.2 % (ref 14.0–49.7)
MONO#: 0.7 10*3/uL (ref 0.1–0.9)
NEUT#: 9.8 10*3/uL — ABNORMAL HIGH (ref 1.5–6.5)
Platelets: 249 10*3/uL (ref 145–400)
RBC: 3.78 10*6/uL (ref 3.70–5.45)
WBC: 12.9 10*3/uL — ABNORMAL HIGH (ref 3.9–10.3)

## 2013-01-21 LAB — COMPREHENSIVE METABOLIC PANEL (CC13)
Albumin: 3.7 g/dL (ref 3.5–5.0)
CO2: 26 mEq/L (ref 22–29)
Calcium: 9 mg/dL (ref 8.4–10.4)
Glucose: 132 mg/dl (ref 70–140)
Sodium: 135 mEq/L — ABNORMAL LOW (ref 136–145)
Total Bilirubin: 0.93 mg/dL (ref 0.20–1.20)
Total Protein: 7.8 g/dL (ref 6.4–8.3)

## 2013-01-21 LAB — LACTATE DEHYDROGENASE (CC13): LDH: 217 U/L (ref 125–245)

## 2013-01-21 MED ORDER — ONDANSETRON HCL 8 MG PO TABS
8.0000 mg | ORAL_TABLET | Freq: Once | ORAL | Status: AC
  Administered 2013-01-21: 8 mg via ORAL

## 2013-01-21 MED ORDER — BORTEZOMIB CHEMO SQ INJECTION 3.5 MG (2.5MG/ML)
1.3000 mg/m2 | Freq: Once | INTRAMUSCULAR | Status: AC
  Administered 2013-01-21: 2.5 mg via SUBCUTANEOUS
  Filled 2013-01-21: qty 2.5

## 2013-01-21 MED ORDER — ACYCLOVIR 400 MG PO TABS
400.0000 mg | ORAL_TABLET | Freq: Two times a day (BID) | ORAL | Status: AC
Start: 2013-01-24 — End: 2013-02-02

## 2013-01-21 NOTE — Patient Instructions (Addendum)
[facility] Discharge Instructions for Patients Receiving Chemotherapy  Today you received the following chemotherapy agents: Velcade.  To help prevent nausea and vomiting after your treatment, we encourage you to take your nausea medication as prescribed.   If you develop nausea and vomiting that is not controlled by your nausea medication, call the clinic.   BELOW ARE SYMPTOMS THAT SHOULD BE REPORTED IMMEDIATELY:  *FEVER GREATER THAN 100.5 F  *CHILLS WITH OR WITHOUT FEVER  NAUSEA AND VOMITING THAT IS NOT CONTROLLED WITH YOUR NAUSEA MEDICATION  *UNUSUAL SHORTNESS OF BREATH  *UNUSUAL BRUISING OR BLEEDING  TENDERNESS IN MOUTH AND THROAT WITH OR WITHOUT PRESENCE OF ULCERS  *URINARY PROBLEMS  *BOWEL PROBLEMS  UNUSUAL RASH Items with * indicate a potential emergency and should be followed up as soon as possible.  Feel free to call the clinic you have any questions or concerns. The clinic phone number is [phone].    

## 2013-01-21 NOTE — Telephone Encounter (Signed)
Nurse reported pt has active shingles left arm . I saw pt and she stated she saw Dr Oneta Rack last week and he started Acylovir 800 mg TID and 2 HS (#50). Her lesions lookk dry and there are very few. Per Dr Arbutus Ped proceed with treatment today. Called to tanya. I called in acylovir rx to start daily beginning on 01/24/13.Pt aware.

## 2013-01-21 NOTE — Telephone Encounter (Signed)
gv and printed appt sched and avs for pt  °

## 2013-01-21 NOTE — Progress Notes (Signed)
OK per Dr. Arbutus Ped, to treat today while patient has Shingles. Patient on medication for Shingles.

## 2013-01-22 ENCOUNTER — Ambulatory Visit (HOSPITAL_BASED_OUTPATIENT_CLINIC_OR_DEPARTMENT_OTHER): Payer: Medicare Other | Admitting: Internal Medicine

## 2013-01-22 ENCOUNTER — Other Ambulatory Visit: Payer: Medicare Other | Admitting: Lab

## 2013-01-22 ENCOUNTER — Telehealth: Payer: Self-pay | Admitting: *Deleted

## 2013-01-22 ENCOUNTER — Telehealth: Payer: Self-pay | Admitting: Internal Medicine

## 2013-01-22 ENCOUNTER — Encounter: Payer: Self-pay | Admitting: Internal Medicine

## 2013-01-22 VITALS — BP 127/64 | HR 89 | Temp 98.5°F | Resp 18 | Ht 66.0 in | Wt 190.4 lb

## 2013-01-22 DIAGNOSIS — C9000 Multiple myeloma not having achieved remission: Secondary | ICD-10-CM

## 2013-01-22 LAB — BETA 2 MICROGLOBULIN, SERUM: Beta-2 Microglobulin: 1.93 mg/L — ABNORMAL HIGH (ref 1.01–1.73)

## 2013-01-22 LAB — KAPPA/LAMBDA LIGHT CHAINS: Kappa:Lambda Ratio: 0.27 (ref 0.26–1.65)

## 2013-01-22 NOTE — Patient Instructions (Addendum)
Myeloma panel showed no significant progression of your disease. Followup visit in 2 weeks.

## 2013-01-22 NOTE — Telephone Encounter (Signed)
Gave pt appt for lab MD and chemo for July and August 2014

## 2013-01-22 NOTE — Progress Notes (Signed)
[facility] [phone]   [phone]  OFFICE PROGRESS NOTE  [name], R, [name] [address], [place]. [address]  [place] [address]  DIAGNOSIS: Recurrent multiple myeloma initially diagnosed in October 2008.   PRIOR THERAPY:  1. Status post palliative radiotherapy to the lumbar spine between L3 and L5. The patient received a total dose of 3000 cGy in 10 fractions under the care of Dr. Mitzi Hansen between May 05, 2007 through May 18, 2007. 2. Status post 5 cycles of systemic chemotherapy with Revlimid and low-dose Decadron with good response to this treatment. 3. Status post autologous peripheral blood stem cell transplant at [facility] on Nov 20, 2007 under the care of Dr. Vicente Serene. 4. Status post treatment for disease recurrence with Velcade, Doxil and Decadron. Last dose given Nov 09, 2009. Discontinued secondary to intolerance but the patient had a good response to treatment at that time. 5. Status post palliative radiotherapy to the T2-T6 thoracic vertebrae completed 03/21/2011 under the care of Dr. Mitzi Hansen. 6. Systemic chemotherapy with Velcade 1.3 mg per meter squared given on days 1, 4, 8 and 11, and Doxil at 30 mg per meter squared given on day 4 in addition to Decadron status post 4 cycles, discontinued secondary to intolerance.   CURRENT THERAPY: systemic therapy with Velcade 1.3 mg/M2 subcutaneously in addition to Decadron 40 mg by mouth on a weekly basis, status post 10 cycles.   INTERVAL HISTORY: [name] 62 y.o. female returns to the clinic today for followup visit accompanied by her daughter and son. The patient is feeling fine today with no specific complaints except for generalized fatigue and generalized cramps. She is currently on Neurontin 600 mg by mouth every 6 hours. The patient tolerated her treatment with Velcade fairly well. She denied having any significant nausea or vomiting. She denied having any fever or  chills. She is currently on treatment with acyclovir for shingles. She is currently on 400 mg by mouth 3 times a day and loss of the shingles have dried with some residual skin rash on the left arm. She will continue on acyclovir 400 mg twice a day as prophylaxis.  MEDICAL HISTORY: Past Medical History  Diagnosis Date  . Thyroid disease Hypothyroidism  . Hypercholesterolemia   . Compression fracture 04/08/2007    pathologic compression fracture  . Hypothyroidism   . FHx: chemotherapy     s/p 5 cycle revlimid/low dose decadron,s/p velcade,doxil,decadron,  . Hx of radiation therapy 05/05/07-05/18/07,& 03/05/11-03/21/11-    l3&l5 in 2008, t2-t6 03/2011  . GERD (gastroesophageal reflux disease)   . Insomnia     associated with steroids  . Nausea   . Constipation     takes oxycontin,vicodin  . Hx of radiation therapy 05/05/2007 to 05/18/2007    palliative, L3-5  . 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 Hx of radiation therapy 03/05/2011 to 03/21/2011    palliative T2-T6, c-spine  . History of autologous stem cell transplant 11/20/2007    [facility], Dr Vicente Serene    ALLERGIES:  has No Known Allergies.  MEDICATIONS:  Current Outpatient Prescriptions  Medication Sig Dispense Refill  . acyclovir (ZOVIRAX) 800 MG tablet Take 800 mg by mouth 3 (three) times daily. Take 800 mg tablet three times a day and 2 tablets hs      . ALPRAZolam (XANAX) 0.5 MG tablet Take 0.5 mg by mouth Twice daily as needed.      Marland Kitchen azithromycin (ZITHROMAX) 250 MG  tablet Take 250 mg by mouth daily.      Marland Kitchen dexamethasone (DECADRON) 4 MG tablet 10 tablet by mouth on a weekly basis starting with the first cycle of chemotherapy.  80 tablet  2  . dicyclomine (BENTYL) 20 MG tablet Take 1 tablet by mouth 2 (two) times daily.      Marland Kitchen ESZOPICLONE 3 MG tablet Take 3 mg by mouth at bedtime. scheduled      . furosemide (LASIX) 40 MG tablet Take 40 mg by mouth 2 (two) times daily.      Marland Kitchen gabapentin (NEURONTIN) 300 MG capsule Take 300 mg by mouth 4  (four) times daily.       Marland Kitchen HYDROcodone-acetaminophen (NORCO) 7.5-325 MG per tablet Take 1 tablet by mouth every 6 (six) hours as needed for pain.  30 tablet  0  . levothyroxine (SYNTHROID, LEVOTHROID) 100 MCG tablet Take 100 mcg by mouth daily.       Marland Kitchen lidocaine-prilocaine (EMLA) cream APPLY TO PORT 1 TO 2 HRS PRIOR TO PROCEDURE  30 g  0  . loperamide (IMODIUM) 2 MG capsule Take 2 mg by mouth 4 (four) times daily as needed for diarrhea or loose stools.      Marland Kitchen morphine (MS CONTIN) 15 MG 12 hr tablet Take 1 tablet (15 mg total) by mouth 2 (two) times daily.  60 tablet  0  . Multiple Vitamin (MULITIVITAMIN WITH MINERALS) TABS Take 1 tablet by mouth daily.      . ondansetron (ZOFRAN-ODT) 4 MG disintegrating tablet Take 1 tablet by mouth every 8 (eight) hours as needed.      . pantoprazole (PROTONIX) 40 MG tablet TAKE 1 TABLET BY MOUTH EVERY DAY  30 tablet  3  . potassium chloride (K-DUR,KLOR-CON) 10 MEQ tablet Take 10 mEq by mouth 2 (two) times daily.      Melene Muller ON 01/24/2013] acyclovir (ZOVIRAX) 400 MG tablet Take 1 tablet (400 mg total) by mouth 2 (two) times daily.  60 tablet  1   No current facility-administered medications for this visit.   Facility-Administered Medications Ordered in Other Visits  Medication Dose Route Frequency Provider Last Rate Last Dose  . 0.9 %  sodium chloride infusion   Intravenous Once Si Gaul, MD      . ondansetron (ZOFRAN) IVPB 8 mg  8 mg Intravenous Once Si Gaul, MD      . sodium chloride 0.9 % injection 10 mL  10 mL Intracatheter PRN Si Gaul, MD        SURGICAL HISTORY:  Past Surgical History  Procedure Laterality Date  . Cholecystectomy    . Knee surgery    . Knee surgery    . Video bronchoscopy  07/30/2011    Procedure: VIDEO BRONCHOSCOPY WITHOUT FLUORO;  Surgeon: Barbaraann Share, MD;  Location: [facility];  Service: Cardiopulmonary;  Laterality: Bilateral;    REVIEW OF SYSTEMS:  A comprehensive review of systems was negative  except for: Constitutional: positive for fatigue Musculoskeletal: positive for Muscle spasm Neurological: positive for paresthesia   PHYSICAL EXAMINATION: General appearance: alert, cooperative and no distress Head: Normocephalic, without obvious abnormality, atraumatic Neck: no adenopathy Lymph nodes: Cervical, supraclavicular, and axillary nodes normal. Resp: clear to auscultation bilaterally Cardio: regular rate and rhythm, S1, S2 normal, no murmur, click, rub or gallop GI: soft, non-tender; bowel sounds normal; no masses,  no organomegaly Extremities: extremities normal, atraumatic, no cyanosis or edema Neurologic: Alert and oriented X 3, normal strength and tone. Normal symmetric reflexes.  Normal coordination and gait  ECOG PERFORMANCE STATUS: 1 - Symptomatic but completely ambulatory  Blood pressure 127/64, pulse 89, temperature 98.5 F (36.9 C), temperature source Oral, resp. rate 18, height 5\' 6"  (1.676 m), weight 190 lb 6.4 oz (86.365 kg).  LABORATORY DATA: Lab Results  Component Value Date   WBC 12.9* 01/21/2013   HGB 12.3 01/21/2013   HCT 36.2 01/21/2013   MCV 95.8 01/21/2013   PLT 249 01/21/2013      Chemistry      Component Value Date/Time   NA 135* 01/21/2013 0949   NA 139 05/31/2012 1600   K 3.9 01/21/2013 0949   K 3.4* 05/31/2012 1600   CL 105 12/17/2012 1015   CL 105 05/31/2012 1600   CO2 26 01/21/2013 0949   CO2 25 05/31/2012 1600   BUN 30.1* 01/21/2013 0949   BUN 13 05/31/2012 1600   CREATININE 1.1 01/21/2013 0949   CREATININE 0.80 05/31/2012 1600      Component Value Date/Time   CALCIUM 9.0 01/21/2013 0949   CALCIUM 10.1 05/31/2012 1600   ALKPHOS 95 01/21/2013 0949   ALKPHOS 87 05/31/2012 1600   AST 18 01/21/2013 0949   AST 17 05/31/2012 1600   ALT 28 01/21/2013 0949   ALT 16 05/31/2012 1600   BILITOT 0.93 01/21/2013 0949   BILITOT 0.7 05/31/2012 1600       RADIOGRAPHIC STUDIES: Dg Chest 2 View  01/19/2013   *RADIOLOGY REPORT*  Clinical Data: Cough, congestion  and shortness of breath.  CHEST - 2 VIEW  Comparison: Chest x-ray 05/31/2012.  Findings: Right internal jugular single lumen power Port-A-Cath with tip terminating at the superior cavoatrial junction. Lung volumes are normal.  No consolidative airspace disease.  No pleural effusions.  No pneumothorax.  No pulmonary nodule or mass noted. Pulmonary vasculature and the cardiomediastinal silhouette are within normal limits.   Surgical clips project over the right upper quadrant of the abdomen, compatible with prior cholecystectomy.  IMPRESSION: 1. No radiographic evidence of acute cardiopulmonary disease.   Original Report Authenticated By: Trudie Reed, M.D.   Mr Lumbar Spine W Wo Contrast  01/11/2013   *RADIOLOGY REPORT*  Clinical Data: Multiple myeloma.  Being treated with chemotherapy. Right-sided back pain, hip and leg pain.  MRI LUMBAR SPINE WITHOUT AND WITH CONTRAST  Technique:  Multiplanar and multiecho pulse sequences of the lumbar spine were obtained without and with intravenous contrast.  Contrast: 17mL MULTIHANCE GADOBENATE DIMEGLUMINE 529 MG/ML IV SOLN  Comparison: CT 12/23/2012.  MRI 05/05/2010  Findings: The scan covers the region from lower T11-S4.  T11 and T12-4 are negative.  There is a 9 mm tumor deposit within the right pedicle of L1.  There is a 7 mm tumor deposit within the right pedicle of L2.  The anterior 1/2  of the L2 vertebral body is infiltrated by tumor.  Pathologic endplate Schmorl's nodes superiorly as previously seen.  Fatty marrow replacement at L3 and below indicates previous radiation.  No tumor is seen at the L3 level.  L4 shows an old compression fracture with loss of height of 70% centrally.  No evidence of residual viable tumor in this location.  No tumor is seen at the L5 level.  In the sacrum, there is a punctate tumor deposit at S2 and a 6 mm deposit on the left at S2.  From the standpoint of degenerative disease, there is facet arthropathy at the L2-3 level with edema  and enhancement.  There is shallow protrusion of the disc  at L4-5 with bilateral facet and ligamentous hypertrophy.  There is stenosis of the lateral recesses left worse than right.  There is a small left foraminal disc herniation at L2-3 that could focally irritate the left L2 nerve root.  IMPRESSION: Sub-centimeter metastases within the right pedicle of L1, the vertebral body at L1, the right pedicle at L2, the central S2 segment and the left sided S2 segment.  2.5 cm metastasis anteriorly within the L2 vertebral body, at the site of the pathologic endplate Schmorl's node.  No neural compression by tumor.  Degenerative changes which could be a cause of back pain. Bilateral facet arthropathy at L2-3.  Lateral recess stenosis at L4- 5 left worse than right.  Left foraminal encroachment by small disc herniation at L2-3.   Original Report Authenticated By: Paulina Fusi, M.D.   Dg Shoulder Left  01/08/2013   *RADIOLOGY REPORT*  Clinical Data: Worsening left shoulder pain for 3-4 months.  LEFT SHOULDER - 2+ VIEW  Comparison: Plain films left shoulder 07/02/2012.  Findings: The humerus is located and the acromioclavicular joint is intact.  Mild acromioclavicular degenerative change is again seen. There is no fracture.  Imaged left lung and ribs appear clear.  IMPRESSION: No acute finding.  Mild acromioclavicular osteoarthritis.  Stable compared to prior exam.   Original Report Authenticated By: Holley Dexter, M.D.    ASSESSMENT AND PLAN: This is a very pleasant 62 years old African American female with history of multiple myeloma currently undergoing systemic treatment with Velcade and Decadron status post 10 weekly doses and tolerating it fairly well. Her recent MRI of the lumbar spine showed no significant evidence for disease progression but persistent metastatic lesions with no cord compression. She has no evidence for progressive disease in the left shoulder. The patient also has no evidence for disease  progression based on the recent myeloma panel. I discussed the imaging and lab result with the patient today. I recommended for her to continue treatment with Velcade and Decadron as scheduled. She would come back for followup visit in 2 weeks for reevaluation. The patient was advised to call immediately if she has any concerning symptoms in the interval.  All questions were answered. The patient knows to call the clinic with any problems, questions or concerns. We can certainly see the patient much sooner if necessary.  I spent 15 minutes counseling the patient face to face. The total time spent in the appointment was 25 minutes.

## 2013-01-22 NOTE — Telephone Encounter (Signed)
Per staff message and POF I have scheduled appts.  JMW  

## 2013-01-25 ENCOUNTER — Other Ambulatory Visit: Payer: Self-pay | Admitting: Internal Medicine

## 2013-01-25 DIAGNOSIS — C9000 Multiple myeloma not having achieved remission: Secondary | ICD-10-CM

## 2013-01-25 MED ORDER — MORPHINE SULFATE ER 15 MG PO TBCR: 15.0000 mg | EXTENDED_RELEASE_TABLET | Freq: Two times a day (BID) | ORAL | Status: DC

## 2013-01-26 ENCOUNTER — Emergency Department (HOSPITAL_COMMUNITY)
Admission: EM | Admit: 2013-01-26 | Discharge: 2013-01-27 | Disposition: A | Discharge status: Home / Self Care | Payer: Medicare Other | Attending: Emergency Medicine | Admitting: Emergency Medicine

## 2013-01-26 ENCOUNTER — Emergency Department (HOSPITAL_COMMUNITY): Payer: Medicare Other

## 2013-01-26 ENCOUNTER — Encounter (HOSPITAL_COMMUNITY): Payer: Self-pay

## 2013-01-26 DIAGNOSIS — R197 Diarrhea, unspecified: Secondary | ICD-10-CM | POA: Insufficient documentation

## 2013-01-26 DIAGNOSIS — Z862 Personal history of diseases of the blood and blood-forming organs and certain disorders involving the immune mechanism: Secondary | ICD-10-CM | POA: Insufficient documentation

## 2013-01-26 DIAGNOSIS — Z8639 Personal history of other endocrine, nutritional and metabolic disease: Secondary | ICD-10-CM | POA: Insufficient documentation

## 2013-01-26 DIAGNOSIS — Z9889 Other specified postprocedural states: Secondary | ICD-10-CM | POA: Insufficient documentation

## 2013-01-26 DIAGNOSIS — G4700 Insomnia, unspecified: Secondary | ICD-10-CM | POA: Insufficient documentation

## 2013-01-26 DIAGNOSIS — Z923 Personal history of irradiation: Secondary | ICD-10-CM | POA: Insufficient documentation

## 2013-01-26 DIAGNOSIS — Z79899 Other long term (current) drug therapy: Secondary | ICD-10-CM | POA: Insufficient documentation

## 2013-01-26 DIAGNOSIS — E039 Hypothyroidism, unspecified: Secondary | ICD-10-CM | POA: Insufficient documentation

## 2013-01-26 DIAGNOSIS — Z8489 Family history of other specified conditions: Secondary | ICD-10-CM | POA: Insufficient documentation

## 2013-01-26 DIAGNOSIS — R112 Nausea with vomiting, unspecified: Secondary | ICD-10-CM | POA: Insufficient documentation

## 2013-01-26 DIAGNOSIS — Z87891 Personal history of nicotine dependence: Secondary | ICD-10-CM | POA: Insufficient documentation

## 2013-01-26 DIAGNOSIS — Z9484 Stem cells transplant status: Secondary | ICD-10-CM | POA: Insufficient documentation

## 2013-01-26 DIAGNOSIS — R109 Unspecified abdominal pain: Secondary | ICD-10-CM

## 2013-01-26 DIAGNOSIS — Z8719 Personal history of other diseases of the digestive system: Secondary | ICD-10-CM | POA: Insufficient documentation

## 2013-01-26 DIAGNOSIS — R1032 Left lower quadrant pain: Secondary | ICD-10-CM | POA: Insufficient documentation

## 2013-01-26 DIAGNOSIS — K219 Gastro-esophageal reflux disease without esophagitis: Secondary | ICD-10-CM | POA: Insufficient documentation

## 2013-01-26 DIAGNOSIS — Z8781 Personal history of (healed) traumatic fracture: Secondary | ICD-10-CM | POA: Insufficient documentation

## 2013-01-26 LAB — CBC WITH DIFFERENTIAL/PLATELET
Basophils Absolute: 0 10*3/uL (ref 0.0–0.1)
Basophils Relative: 0 % (ref 0–1)
Eosinophils Absolute: 0 10*3/uL (ref 0.0–0.7)
HCT: 37 % (ref 36.0–46.0)
MCHC: 34.3 g/dL (ref 30.0–36.0)
Monocytes Absolute: 1.1 10*3/uL — ABNORMAL HIGH (ref 0.1–1.0)
Neutro Abs: 19.4 10*3/uL — ABNORMAL HIGH (ref 1.7–7.7)
Neutrophils Relative %: 90 % — ABNORMAL HIGH (ref 43–77)
RDW: 15.3 % (ref 11.5–15.5)

## 2013-01-26 LAB — COMPREHENSIVE METABOLIC PANEL
AST: 16 U/L (ref 0–37)
Albumin: 3.2 g/dL — ABNORMAL LOW (ref 3.5–5.2)
Chloride: 102 mEq/L (ref 96–112)
Creatinine, Ser: 0.9 mg/dL (ref 0.50–1.10)
Total Bilirubin: 0.6 mg/dL (ref 0.3–1.2)
Total Protein: 6.8 g/dL (ref 6.0–8.3)

## 2013-01-26 MED ORDER — IOHEXOL 300 MG/ML  SOLN
50.0000 mL | Freq: Once | INTRAMUSCULAR | Status: AC | PRN
  Administered 2013-01-26: 50 mL via ORAL

## 2013-01-26 MED ORDER — IOHEXOL 300 MG/ML  SOLN
100.0000 mL | Freq: Once | INTRAMUSCULAR | Status: AC | PRN
  Administered 2013-01-26: 100 mL via INTRAVENOUS

## 2013-01-26 NOTE — ED Notes (Addendum)
Pt reports being a multiple myeloma pt and also recently dx for shingles in which pt is still taking medications for.  Pt skin warm and dry.

## 2013-01-26 NOTE — ED Notes (Signed)
Pt reports sudden ab pain with n/v. Family reports that pt was unable to recall where she was when they were helping her get cleaned up.

## 2013-01-26 NOTE — ED Provider Notes (Signed)
CSN: [id number]     Arrival date & time 01/26/13  2043 History     First MD Initiated Contact with Patient 01/26/13 2201     Chief Complaint  Patient presents with  . Weakness  . Diarrhea   HPI  History provided by patient and significant other. Patient is a 62 year old female with history of multiple myeloma undergoing chemotherapy who presents with complaints of episodes of diarrhea, abdominal pain and episode of vomiting. Patient has had some increased fatigue and weakness but today was at home when she suddenly has some sharp abdominal pains with nausea vomiting. She reports having loose stools first several days. Denies any blood or mucus in stools. She has otherwise been generally feeling well with some normal appetite. She has not had any fever, chills or sweats. Patient's mother does report that patient had a long day and went fishing with family. She was outside and he states he she may be dehydrated. Patient reports making good urine today. She denies any other aggravating or alleviating factors. She took her normal medications for pain this evening. She is on strong doses of morphine for her chronic pains. No significant changes. No other aggravating or alleviating factors. Patient does also report recently having pain from shingles rash to her left extremity. This has since improved. She was on medications for this recently. No other associated symptoms.    Past Medical History  Diagnosis Date  . Thyroid disease Hypothyroidism  . Hypercholesterolemia   . Compression fracture 04/08/2007    pathologic compression fracture  . Hypothyroidism   . FHx: chemotherapy     s/p 5 cycle revlimid/low dose decadron,s/p velcade,doxil,decadron,  . Hx of radiation therapy 05/05/07-05/18/07,& 03/05/11-03/21/11-    l3&l5 in 2008, t2-t6 03/2011  . GERD (gastroesophageal reflux disease)   . Insomnia     associated with steroids  . Nausea   . Constipation     takes oxycontin,vicodin  . Hx of  radiation therapy 05/05/2007 to 05/18/2007    palliative, L3-5  . 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 Hx of radiation therapy 03/05/2011 to 03/21/2011    palliative T2-T6, c-spine  . History of autologous stem cell transplant 11/20/2007    [facility], Dr Vicente Serene   Past Surgical History  Procedure Laterality Date  . Cholecystectomy    . Knee surgery    . Knee surgery    . Video bronchoscopy  07/30/2011    Procedure: VIDEO BRONCHOSCOPY WITHOUT FLUORO;  Surgeon: Barbaraann Share, MD;  Location: [facility];  Service: Cardiopulmonary;  Laterality: Bilateral;   Family History  Problem Relation Age of Onset  . Cancer Brother     lung ca  . Cancer Maternal Uncle     colon  . Cancer Maternal Grandmother     breast ca   History  Substance Use Topics  . Smoking status: Former Smoker -- 0.25 packs/day for 6 years    Quit date: 08/27/1978  . Smokeless tobacco: Never Used  . Alcohol Use: No   OB History   Grav Para Term Preterm Abortions TAB SAB Ect Mult Living                 Review of Systems  Constitutional: Negative for fever.  Respiratory: Negative for shortness of breath.   Cardiovascular: Negative for chest pain.  Gastrointestinal: Positive for nausea, vomiting, abdominal pain and diarrhea. Negative for blood in stool.  Genitourinary: Negative for dysuria, frequency and hematuria.  All other systems reviewed and  are negative.    Allergies  Review of patient's allergies indicates no known allergies.  Home Medications   Current Outpatient Rx  Name  Route  Sig  Dispense  Refill  . acyclovir (ZOVIRAX) 400 MG tablet   Oral   Take 1 tablet (400 mg total) by mouth 2 (two) times daily.   60 tablet   1   . acyclovir (ZOVIRAX) 800 MG tablet   Oral   Take 800 mg by mouth 4 (four) times daily. Take 800mg  Three times a day and 1600 mg at bedtime.         . ALPRAZolam (XANAX) 0.5 MG tablet   Oral   Take 0.5 mg by mouth Twice daily as needed for anxiety.          Marland Kitchen azithromycin  (ZITHROMAX) 250 MG tablet   Oral   Take 250 mg by mouth daily.         Marland Kitchen dexamethasone (DECADRON) 4 MG tablet      10 tablet by mouth on a weekly basis starting with the first cycle of chemotherapy.   80 tablet   2   . dicyclomine (BENTYL) 20 MG tablet   Oral   Take 1 tablet by mouth 2 (two) times daily.         Marland Kitchen ESZOPICLONE 3 MG tablet   Oral   Take 3 mg by mouth at bedtime. scheduled         . furosemide (LASIX) 40 MG tablet   Oral   Take 40 mg by mouth 2 (two) times daily.         Marland Kitchen gabapentin (NEURONTIN) 300 MG capsule   Oral   Take 600 mg by mouth 4 (four) times daily.          Marland Kitchen levothyroxine (SYNTHROID, LEVOTHROID) 100 MCG tablet   Oral   Take 100 mcg by mouth daily.          Marland Kitchen lidocaine-prilocaine (EMLA) cream      APPLY TO PORT 1 TO 2 HRS PRIOR TO PROCEDURE   30 g   0   . loperamide (IMODIUM) 2 MG capsule   Oral   Take 2 mg by mouth 4 (four) times daily as needed for diarrhea or loose stools.         Marland Kitchen morphine (MS CONTIN) 15 MG 12 hr tablet   Oral   Take 1 tablet (15 mg total) by mouth 2 (two) times daily.   60 tablet   0   . Multiple Vitamin (MULITIVITAMIN WITH MINERALS) TABS   Oral   Take 1 tablet by mouth daily.         . ondansetron (ZOFRAN-ODT) 4 MG disintegrating tablet   Oral   Take 1 tablet by mouth every 8 (eight) hours as needed for nausea.          . pantoprazole (PROTONIX) 40 MG tablet   Oral   Take 40 mg by mouth daily.         . potassium chloride (K-DUR,KLOR-CON) 10 MEQ tablet   Oral   Take 10 mEq by mouth 2 (two) times daily.          BP 129/65  Pulse 86  Temp(Src) 98.1 F (36.7 C) (Oral)  Resp 18  SpO2 98% Physical Exam  Nursing note and vitals reviewed. Constitutional: She is oriented to person, place, and time. She appears well-developed and well-nourished. No distress.  HENT:  Head: Normocephalic.  Cardiovascular: Normal rate and  regular rhythm.   Pulmonary/Chest: Effort normal and breath  sounds normal. No respiratory distress. She has no wheezes. She has no rales.  Power port in right chest.    Abdominal: Soft. There is tenderness in the left lower quadrant. There is no rebound, no guarding and no CVA tenderness.  Musculoskeletal: Normal range of motion.  Neurological: She is alert and oriented to person, place, and time.  Skin: Skin is warm and dry. No rash noted.  Psychiatric: She has a normal mood and affect. Her behavior is normal.    ED Course   Procedures   Results for orders placed during the hospital encounter of 01/26/13  CBC WITH DIFFERENTIAL      Result Value Range   WBC 21.6 (*) 4.0 - 10.5 K/uL   RBC 3.83 (*) 3.87 - 5.11 MIL/uL   Hemoglobin 12.7  12.0 - 15.0 g/dL   HCT 16.1  09.6 - 04.5 %   MCV 96.6  78.0 - 100.0 fL   MCH 33.2  26.0 - 34.0 pg   MCHC 34.3  30.0 - 36.0 g/dL   RDW 40.9  81.1 - 91.4 %   Platelets 209  150 - 400 K/uL   Neutrophils Relative % 90 (*) 43 - 77 %   Neutro Abs 19.4 (*) 1.7 - 7.7 K/uL   Lymphocytes Relative 5 (*) 12 - 46 %   Lymphs Abs 1.0  0.7 - 4.0 K/uL   Monocytes Relative 5  3 - 12 %   Monocytes Absolute 1.1 (*) 0.1 - 1.0 K/uL   Eosinophils Relative 0  0 - 5 %   Eosinophils Absolute 0.0  0.0 - 0.7 K/uL   Basophils Relative 0  0 - 1 %   Basophils Absolute 0.0  0.0 - 0.1 K/uL  COMPREHENSIVE METABOLIC PANEL      Result Value Range   Sodium 137  135 - 145 mEq/L   Potassium 4.4  3.5 - 5.1 mEq/L   Chloride 102  96 - 112 mEq/L   CO2 25  19 - 32 mEq/L   Glucose, Bld 142 (*) 70 - 99 mg/dL   BUN 21  6 - 23 mg/dL   Creatinine, Ser 7.82  0.50 - 1.10 mg/dL   Calcium 8.7  8.4 - 95.6 mg/dL   Total Protein 6.8  6.0 - 8.3 g/dL   Albumin 3.2 (*) 3.5 - 5.2 g/dL   AST 16  0 - 37 U/L   ALT 27  0 - 35 U/L   Alkaline Phosphatase 109  39 - 117 U/L   Total Bilirubin 0.6  0.3 - 1.2 mg/dL   GFR calc non Af Amer 68 (*) >90 mL/min   GFR calc Af Amer 78 (*) >90 mL/min  LIPASE, BLOOD      Result Value Range   Lipase 37  11 - 59 U/L       Ct Abdomen Pelvis W Contrast  01/27/2013   *RADIOLOGY REPORT*  Clinical Data: Sudden onset abdominal pain with nausea and vomiting.  Left lower quadrant pain, ? diverticulitis.  CT ABDOMEN AND PELVIS WITH CONTRAST  Technique:  Multidetector CT imaging of the abdomen and pelvis was performed following the standard protocol during bolus administration of intravenous contrast.  Contrast: OMNIPAQUE IOHEXOL 300 MG/ML  SOLN  Comparison: 12/23/2012  Findings:  BODY WALL: Unremarkable.  LOWER CHEST:  Mediastinum: Unremarkable.  Lungs/pleura: No consolidation.  ABDOMEN/PELVIS:  Liver: No focal abnormality.  Biliary: Cholecystectomy.  Pancreas: Unremarkable.  Spleen: Unremarkable.  Adrenals:  11 mm nodule the left adrenal gland, stable since at least 2013 imaging.  Kidneys and ureters: No hydronephrosis or stone.  Bladder: Unremarkable.  Bowel: No obstruction. Normal appendix.  Retroperitoneum: No mass or adenopathy.  Peritoneum: No free fluid or gas.  Reproductive: Hysterectomy.  Vascular: No acute abnormality.  OSSEOUS:  Numerous osteolytic lesions throughout the imaged skeleton.  The patient has undergone recent lumbar spine MRI, showing multiple deposits not seen on this examination.  No indication of acute fracture.  There is remote L4 vertebral body fracturing with compression.  Preexisting L2 superior endplate Schmorl's node, which is pathologic by MRI. When comparing to abdominal CT 05/19/2012, there has been notable enlargement of a lytic lesion within the left ischium, posterior to the acetabulum, now 2.4 cm as compared to proximal 1.2 cm previously.  IMPRESSION:  1.  No acute intra-abdominal findings. 2.  Multiple myeloma.  Compared to 05/19/2012 CT, there is been enlargement of a left ischial myelomatous deposit, located posterior to the acetabulum, now 2.4 cm.  No acute fractures.   Original Report Authenticated By: Tiburcio Pea     1. Abdominal pain     MDM  10:05 PM patient seen and  evaluated. Patient is drowsy and falls asleep easily but awakes easily and appears in no acute distress. Alert and oriented x4.  Pt continues to do well.  Pt able to tolerate by mouth contrast without any additional vomiting. Labs do show increased WBC. This likely related to her multiple myeloma. CT scan unremarkable for acute changes.  Pt informed of labs and CT findings.  At this time she is feeling improved.  She wishes to return home.  No other concerning findings.  Pt will follow up with her doctors tomorrow for continued evaluation and treatment.   Angus Seller, [name] 01/27/13 2012

## 2013-01-27 MED ORDER — ONDANSETRON HCL 4 MG/2ML IJ SOLN
4.0000 mg | Freq: Once | INTRAMUSCULAR | Status: AC
  Administered 2013-01-27: 4 mg via INTRAVENOUS
  Filled 2013-01-27: qty 2

## 2013-01-27 MED ORDER — ONDANSETRON 8 MG PO TBDP: ORAL_TABLET | ORAL | Status: DC

## 2013-01-27 MED ORDER — MORPHINE SULFATE 4 MG/ML IJ SOLN
4.0000 mg | Freq: Once | INTRAMUSCULAR | Status: AC
  Administered 2013-01-27: 4 mg via INTRAVENOUS
  Filled 2013-01-27: qty 1

## 2013-01-27 MED ORDER — HEPARIN SOD (PORK) LOCK FLUSH 100 UNIT/ML IV SOLN
INTRAVENOUS | Status: AC
  Administered 2013-01-27: 500 [IU]
  Filled 2013-01-27: qty 5

## 2013-01-28 ENCOUNTER — Ambulatory Visit (HOSPITAL_BASED_OUTPATIENT_CLINIC_OR_DEPARTMENT_OTHER): Payer: Medicare Other

## 2013-01-28 ENCOUNTER — Other Ambulatory Visit (HOSPITAL_BASED_OUTPATIENT_CLINIC_OR_DEPARTMENT_OTHER): Payer: Medicare Other | Admitting: Lab

## 2013-01-28 VITALS — BP 109/67 | HR 84 | Temp 98.1°F | Resp 18

## 2013-01-28 DIAGNOSIS — C9002 Multiple myeloma in relapse: Secondary | ICD-10-CM

## 2013-01-28 DIAGNOSIS — C9000 Multiple myeloma not having achieved remission: Secondary | ICD-10-CM

## 2013-01-28 DIAGNOSIS — Z5112 Encounter for antineoplastic immunotherapy: Secondary | ICD-10-CM

## 2013-01-28 LAB — CBC WITH DIFFERENTIAL/PLATELET
Basophils Absolute: 0 10*3/uL (ref 0.0–0.1)
Eosinophils Absolute: 0.1 10*3/uL (ref 0.0–0.5)
HCT: 33.1 % — ABNORMAL LOW (ref 34.8–46.6)
HGB: 11.1 g/dL — ABNORMAL LOW (ref 11.6–15.9)
LYMPH%: 9.8 % — ABNORMAL LOW (ref 14.0–49.7)
MCHC: 33.4 g/dL (ref 31.5–36.0)
MONO#: 0.6 10*3/uL (ref 0.1–0.9)
NEUT#: 11.1 10*3/uL — ABNORMAL HIGH (ref 1.5–6.5)
NEUT%: 84.6 % — ABNORMAL HIGH (ref 38.4–76.8)
Platelets: 181 10*3/uL (ref 145–400)
WBC: 13.1 10*3/uL — ABNORMAL HIGH (ref 3.9–10.3)

## 2013-01-28 LAB — COMPREHENSIVE METABOLIC PANEL (CC13)
CO2: 21 mEq/L — ABNORMAL LOW (ref 22–29)
Calcium: 8.4 mg/dL (ref 8.4–10.4)
Chloride: 110 mEq/L — ABNORMAL HIGH (ref 98–109)
Creatinine: 0.9 mg/dL (ref 0.6–1.1)
Glucose: 99 mg/dl (ref 70–140)
Total Bilirubin: 0.99 mg/dL (ref 0.20–1.20)

## 2013-01-28 MED ORDER — BORTEZOMIB CHEMO SQ INJECTION 3.5 MG (2.5MG/ML)
1.3000 mg/m2 | Freq: Once | INTRAMUSCULAR | Status: AC
  Administered 2013-01-28: 2.5 mg via SUBCUTANEOUS
  Filled 2013-01-28: qty 2.5

## 2013-01-28 MED ORDER — ONDANSETRON HCL 8 MG PO TABS
8.0000 mg | ORAL_TABLET | Freq: Once | ORAL | Status: AC
  Administered 2013-01-28: 8 mg via ORAL

## 2013-01-28 NOTE — ED Provider Notes (Signed)
Medical screening examination/treatment/procedure(s) were performed by non-physician practitioner and as supervising physician I was immediately available for consultation/collaboration.  Sunnie Nielsen, MD 01/28/13 [phone]

## 2013-01-28 NOTE — Patient Instructions (Addendum)
[facility] Discharge Instructions for Patients Receiving Chemotherapy  Today you received the following chemotherapy agents Velcade.  To help prevent nausea and vomiting after your treatment, we encourage you to take your nausea medication as needed.   If you develop nausea and vomiting that is not controlled by your nausea medication, call the clinic.   BELOW ARE SYMPTOMS THAT SHOULD BE REPORTED IMMEDIATELY:  *FEVER GREATER THAN 100.5 F  *CHILLS WITH OR WITHOUT FEVER  NAUSEA AND VOMITING THAT IS NOT CONTROLLED WITH YOUR NAUSEA MEDICATION  *UNUSUAL SHORTNESS OF BREATH  *UNUSUAL BRUISING OR BLEEDING  TENDERNESS IN MOUTH AND THROAT WITH OR WITHOUT PRESENCE OF ULCERS  *URINARY PROBLEMS  *BOWEL PROBLEMS  UNUSUAL RASH Items with * indicate a potential emergency and should be followed up as soon as possible.  Feel free to call the clinic you have any questions or concerns. The clinic phone number is [phone].    

## 2013-01-29 ENCOUNTER — Other Ambulatory Visit: Payer: Self-pay | Admitting: Certified Registered Nurse Anesthetist

## 2013-02-04 ENCOUNTER — Ambulatory Visit (HOSPITAL_BASED_OUTPATIENT_CLINIC_OR_DEPARTMENT_OTHER): Payer: Medicare Other | Admitting: Physician Assistant

## 2013-02-04 ENCOUNTER — Ambulatory Visit (HOSPITAL_BASED_OUTPATIENT_CLINIC_OR_DEPARTMENT_OTHER): Payer: Medicare Other

## 2013-02-04 ENCOUNTER — Telehealth: Payer: Self-pay | Admitting: Internal Medicine

## 2013-02-04 ENCOUNTER — Other Ambulatory Visit (HOSPITAL_BASED_OUTPATIENT_CLINIC_OR_DEPARTMENT_OTHER): Payer: Medicare Other | Admitting: Lab

## 2013-02-04 VITALS — BP 131/76 | HR 80 | Temp 98.3°F | Resp 18 | Ht 66.0 in | Wt 192.0 lb

## 2013-02-04 DIAGNOSIS — R5381 Other malaise: Secondary | ICD-10-CM

## 2013-02-04 DIAGNOSIS — C9002 Multiple myeloma in relapse: Secondary | ICD-10-CM

## 2013-02-04 DIAGNOSIS — C9000 Multiple myeloma not having achieved remission: Secondary | ICD-10-CM

## 2013-02-04 DIAGNOSIS — M899 Disorder of bone, unspecified: Secondary | ICD-10-CM

## 2013-02-04 DIAGNOSIS — R5383 Other fatigue: Secondary | ICD-10-CM

## 2013-02-04 DIAGNOSIS — Z5112 Encounter for antineoplastic immunotherapy: Secondary | ICD-10-CM

## 2013-02-04 LAB — COMPREHENSIVE METABOLIC PANEL (CC13)
ALT: 13 U/L (ref 0–55)
CO2: 21 mEq/L — ABNORMAL LOW (ref 22–29)
Calcium: 8.9 mg/dL (ref 8.4–10.4)
Chloride: 107 mEq/L (ref 98–109)
Glucose: 154 mg/dl — ABNORMAL HIGH (ref 70–140)
Sodium: 137 mEq/L (ref 136–145)
Total Bilirubin: 0.39 mg/dL (ref 0.20–1.20)
Total Protein: 6.7 g/dL (ref 6.4–8.3)

## 2013-02-04 LAB — CBC WITH DIFFERENTIAL/PLATELET
BASO%: 0.2 % (ref 0.0–2.0)
Eosinophils Absolute: 0 10*3/uL (ref 0.0–0.5)
HCT: 32.2 % — ABNORMAL LOW (ref 34.8–46.6)
LYMPH%: 13.3 % — ABNORMAL LOW (ref 14.0–49.7)
MONO#: 0.5 10*3/uL (ref 0.1–0.9)
NEUT#: 7.9 10*3/uL — ABNORMAL HIGH (ref 1.5–6.5)
NEUT%: 81.7 % — ABNORMAL HIGH (ref 38.4–76.8)
Platelets: 172 10*3/uL (ref 145–400)
RBC: 3.35 10*6/uL — ABNORMAL LOW (ref 3.70–5.45)
WBC: 9.6 10*3/uL (ref 3.9–10.3)
lymph#: 1.3 10*3/uL (ref 0.9–3.3)

## 2013-02-04 MED ORDER — HEPARIN SOD (PORK) LOCK FLUSH 100 UNIT/ML IV SOLN
500.0000 [IU] | Freq: Once | INTRAVENOUS | Status: AC
  Administered 2013-02-04: 500 [IU] via INTRAVENOUS
  Filled 2013-02-04: qty 5

## 2013-02-04 MED ORDER — BORTEZOMIB CHEMO SQ INJECTION 3.5 MG (2.5MG/ML)
1.3000 mg/m2 | Freq: Once | INTRAMUSCULAR | Status: AC
  Administered 2013-02-04: 2.5 mg via SUBCUTANEOUS
  Filled 2013-02-04: qty 2.5

## 2013-02-04 MED ORDER — ONDANSETRON HCL 8 MG PO TABS
8.0000 mg | ORAL_TABLET | Freq: Once | ORAL | Status: AC
Start: 2013-02-04 — End: 2013-02-04
  Administered 2013-02-04: 8 mg via ORAL

## 2013-02-04 MED ORDER — SODIUM CHLORIDE 0.9 % IJ SOLN
10.0000 mL | INTRAMUSCULAR | Status: DC | PRN
  Administered 2013-02-04: 10 mL via INTRAVENOUS
  Filled 2013-02-04: qty 10

## 2013-02-04 NOTE — Telephone Encounter (Signed)
gv pt appt schedule for August and September.  °

## 2013-02-04 NOTE — Patient Instructions (Addendum)
[facility] Discharge Instructions for Patients Receiving Chemotherapy  Today you received the following chemotherapy agents: Velcade.  To help prevent nausea and vomiting after your treatment, we encourage you to take your nausea medication as prescribed.   If you develop nausea and vomiting that is not controlled by your nausea medication, call the clinic.   BELOW ARE SYMPTOMS THAT SHOULD BE REPORTED IMMEDIATELY:  *FEVER GREATER THAN 100.5 F  *CHILLS WITH OR WITHOUT FEVER  NAUSEA AND VOMITING THAT IS NOT CONTROLLED WITH YOUR NAUSEA MEDICATION  *UNUSUAL SHORTNESS OF BREATH  *UNUSUAL BRUISING OR BLEEDING  TENDERNESS IN MOUTH AND THROAT WITH OR WITHOUT PRESENCE OF ULCERS  *URINARY PROBLEMS  *BOWEL PROBLEMS  UNUSUAL RASH Items with * indicate a potential emergency and should be followed up as soon as possible.  Feel free to call the clinic you have any questions or concerns. The clinic phone number is [phone].    

## 2013-02-05 NOTE — Patient Instructions (Addendum)
Continue weekly labs and chemotherapy as schedule Follow up in 3 weeks

## 2013-02-07 NOTE — Progress Notes (Addendum)
[facility] [phone]   [phone]  SHARED VISIT PROGRESS NOTE  [name], R, [name] [address] [place]. [address] [place] [address]  DIAGNOSIS: Recurrent multiple myeloma initially diagnosed in October 2008.   PRIOR THERAPY:  1. Status post palliative radiotherapy to the lumbar spine between L3 and L5. The patient received a total dose of 3000 cGy in 10 fractions under the care of Dr. Mitzi Hansen between May 05, 2007 through May 18, 2007. 2. Status post 5 cycles of systemic chemotherapy with Revlimid and low-dose Decadron with good response to this treatment. 3. Status post autologous peripheral blood stem cell transplant at [facility] on Nov 20, 2007 under the care of Dr. Vicente Serene. 4. Status post treatment for disease recurrence with Velcade, Doxil and Decadron. Last dose given Nov 09, 2009. Discontinued secondary to intolerance but the patient had a good response to treatment at that time. 5. Status post palliative radiotherapy to the T2-T6 thoracic vertebrae completed 03/21/2011 under the care of Dr. Mitzi Hansen. 6. Systemic chemotherapy with Velcade 1.3 mg per meter squared given on days 1, 4, 8 and 11, and Doxil at 30 mg per meter squared given on day 4 in addition to Decadron status post 4 cycles, discontinued secondary to intolerance.   CURRENT THERAPY: systemic therapy with Velcade 1.3 mg/M2 subcutaneously in addition to Decadron 40 mg by mouth on a weekly basis, status post 11 cycles.   INTERVAL HISTORY: [name] 62 y.o. female returns to the clinic today for followup visit accompanied by her daughter.  The patient is feeling fine today with no specific complaints except for generalized fatigue and generalized cramps. She is currently on Neurontin 600 mg by mouth every 6 hours. The patient tolerated her treatment with Velcade fairly well. She denied having any significant nausea or vomiting. She denied having any fever or  chills. She does complain of abdominal gas and bloating but does report that she is eating a lot of week and fiber. She has some intermittent sharp pains in her abdomen related to this as well. She will continues on acyclovir 400 mg twice a day as prophylaxis.  MEDICAL HISTORY: Past Medical History  Diagnosis Date  . Thyroid disease Hypothyroidism  . Hypercholesterolemia   . Compression fracture 04/08/2007    pathologic compression fracture  . Hypothyroidism   . FHx: chemotherapy     s/p 5 cycle revlimid/low dose decadron,s/p velcade,doxil,decadron,  . Hx of radiation therapy 05/05/07-05/18/07,& 03/05/11-03/21/11-    l3&l5 in 2008, t2-t6 03/2011  . GERD (gastroesophageal reflux disease)   . Insomnia     associated with steroids  . Nausea   . Constipation     takes oxycontin,vicodin  . Hx of radiation therapy 05/05/2007 to 05/18/2007    palliative, L3-5  . 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 Hx of radiation therapy 03/05/2011 to 03/21/2011    palliative T2-T6, c-spine  . History of autologous stem cell transplant 11/20/2007    [facility], Dr Vicente Serene    ALLERGIES:  has No Known Allergies.  MEDICATIONS:  Current Outpatient Prescriptions  Medication Sig Dispense Refill  . acyclovir (ZOVIRAX) 800 MG tablet Take 800 mg by mouth 4 (four) times daily. Take 800mg  Three times a day and 1600 mg at bedtime.      . ALPRAZolam (XANAX) 0.5 MG tablet Take 0.5 mg by mouth Twice daily as needed for anxiety.       Marland Kitchen azithromycin (ZITHROMAX) 250 MG  tablet Take 250 mg by mouth daily.      Marland Kitchen dexamethasone (DECADRON) 4 MG tablet 10 tablet by mouth on a weekly basis starting with the first cycle of chemotherapy.  80 tablet  2  . dicyclomine (BENTYL) 20 MG tablet Take 1 tablet by mouth 2 (two) times daily.      Marland Kitchen ESZOPICLONE 3 MG tablet Take 3 mg by mouth at bedtime. scheduled      . furosemide (LASIX) 40 MG tablet Take 40 mg by mouth 2 (two) times daily.      Marland Kitchen gabapentin (NEURONTIN) 300 MG capsule Take 600 mg by  mouth 4 (four) times daily.       Marland Kitchen levothyroxine (SYNTHROID, LEVOTHROID) 100 MCG tablet Take 100 mcg by mouth daily.       Marland Kitchen lidocaine-prilocaine (EMLA) cream APPLY TO PORT 1 TO 2 HRS PRIOR TO PROCEDURE  30 g  0  . loperamide (IMODIUM) 2 MG capsule Take 2 mg by mouth 4 (four) times daily as needed for diarrhea or loose stools.      Marland Kitchen morphine (MS CONTIN) 15 MG 12 hr tablet Take 1 tablet (15 mg total) by mouth 2 (two) times daily.  60 tablet  0  . Multiple Vitamin (MULITIVITAMIN WITH MINERALS) TABS Take 1 tablet by mouth daily.      . ondansetron (ZOFRAN ODT) 8 MG disintegrating tablet 8mg  ODT q4 hours prn nausea  20 tablet  0  . ondansetron (ZOFRAN-ODT) 4 MG disintegrating tablet Take 1 tablet by mouth every 8 (eight) hours as needed for nausea.       . pantoprazole (PROTONIX) 40 MG tablet Take 40 mg by mouth daily.      . potassium chloride (K-DUR,KLOR-CON) 10 MEQ tablet Take 10 mEq by mouth 2 (two) times daily.       No current facility-administered medications for this visit.   Facility-Administered Medications Ordered in Other Visits  Medication Dose Route Frequency Provider Last Rate Last Dose  . 0.9 %  sodium chloride infusion   Intravenous Once Si Gaul, MD      . ondansetron (ZOFRAN) IVPB 8 mg  8 mg Intravenous Once Si Gaul, MD      . sodium chloride 0.9 % injection 10 mL  10 mL Intracatheter PRN Si Gaul, MD        SURGICAL HISTORY:  Past Surgical History  Procedure Laterality Date  . Cholecystectomy    . Knee surgery    . Knee surgery    . Video bronchoscopy  07/30/2011    Procedure: VIDEO BRONCHOSCOPY WITHOUT FLUORO;  Surgeon: Barbaraann Share, MD;  Location: [facility];  Service: Cardiopulmonary;  Laterality: Bilateral;    REVIEW OF SYSTEMS:  A comprehensive review of systems was negative except for: Constitutional: positive for fatigue Gastrointestinal: positive for abdominal pain and Increased gas and bloating Musculoskeletal: positive for Muscle  spasm Neurological: positive for paresthesia   PHYSICAL EXAMINATION: General appearance: alert, cooperative and no distress Head: Normocephalic, without obvious abnormality, atraumatic Neck: no adenopathy Lymph nodes: Cervical, supraclavicular, and axillary nodes normal. Resp: clear to auscultation bilaterally Cardio: regular rate and rhythm, S1, S2 normal, no murmur, click, rub or gallop GI: soft, non-tender; bowel sounds normal; no masses,  no organomegaly Extremities: extremities normal, atraumatic, no cyanosis or edema Neurologic: Alert and oriented X 3, normal strength and tone. Normal symmetric reflexes. Normal coordination and gait  ECOG PERFORMANCE STATUS: 1 - Symptomatic but completely ambulatory  Blood pressure 131/76, pulse 80, temperature 98.3  F (36.8 C), temperature source Oral, resp. rate 18, height 5\' 6"  (1.676 m), weight 192 lb (87.091 kg).  LABORATORY DATA: Lab Results  Component Value Date   WBC 9.6 02/04/2013   HGB 11.0* 02/04/2013   HCT 32.2* 02/04/2013   MCV 96.0 02/04/2013   PLT 172 02/04/2013      Chemistry      Component Value Date/Time   NA 137 02/04/2013 0942   NA 137 01/26/2013 2220   K 4.3 02/04/2013 0942   K 4.4 01/26/2013 2220   CL 102 01/26/2013 2220   CL 105 12/17/2012 1015   CO2 21* 02/04/2013 0942   CO2 25 01/26/2013 2220   BUN 11.6 02/04/2013 0942   BUN 21 01/26/2013 2220   CREATININE 0.8 02/04/2013 0942   CREATININE 0.90 01/26/2013 2220      Component Value Date/Time   CALCIUM 8.9 02/04/2013 0942   CALCIUM 8.7 01/26/2013 2220   ALKPHOS 121 02/04/2013 0942   ALKPHOS 109 01/26/2013 2220   AST 9 02/04/2013 0942   AST 16 01/26/2013 2220   ALT 13 02/04/2013 0942   ALT 27 01/26/2013 2220   BILITOT 0.39 02/04/2013 0942   BILITOT 0.6 01/26/2013 2220       RADIOGRAPHIC STUDIES: Dg Chest 2 View  01/19/2013   *RADIOLOGY REPORT*  Clinical Data: Cough, congestion and shortness of breath.  CHEST - 2 VIEW  Comparison: Chest x-ray 05/31/2012.  Findings: Right internal jugular  single lumen power Port-A-Cath with tip terminating at the superior cavoatrial junction. Lung volumes are normal.  No consolidative airspace disease.  No pleural effusions.  No pneumothorax.  No pulmonary nodule or mass noted. Pulmonary vasculature and the cardiomediastinal silhouette are within normal limits.   Surgical clips project over the right upper quadrant of the abdomen, compatible with prior cholecystectomy.  IMPRESSION: 1. No radiographic evidence of acute cardiopulmonary disease.   Original Report Authenticated By: Trudie Reed, M.D.   Mr Lumbar Spine W Wo Contrast  01/11/2013   *RADIOLOGY REPORT*  Clinical Data: Multiple myeloma.  Being treated with chemotherapy. Right-sided back pain, hip and leg pain.  MRI LUMBAR SPINE WITHOUT AND WITH CONTRAST  Technique:  Multiplanar and multiecho pulse sequences of the lumbar spine were obtained without and with intravenous contrast.  Contrast: 17mL MULTIHANCE GADOBENATE DIMEGLUMINE 529 MG/ML IV SOLN  Comparison: CT 12/23/2012.  MRI 05/05/2010  Findings: The scan covers the region from lower T11-S4.  T11 and T12-4 are negative.  There is a 9 mm tumor deposit within the right pedicle of L1.  There is a 7 mm tumor deposit within the right pedicle of L2.  The anterior 1/2  of the L2 vertebral body is infiltrated by tumor.  Pathologic endplate Schmorl's nodes superiorly as previously seen.  Fatty marrow replacement at L3 and below indicates previous radiation.  No tumor is seen at the L3 level.  L4 shows an old compression fracture with loss of height of 70% centrally.  No evidence of residual viable tumor in this location.  No tumor is seen at the L5 level.  In the sacrum, there is a punctate tumor deposit at S2 and a 6 mm deposit on the left at S2.  From the standpoint of degenerative disease, there is facet arthropathy at the L2-3 level with edema and enhancement.  There is shallow protrusion of the disc at L4-5 with bilateral facet and ligamentous  hypertrophy.  There is stenosis of the lateral recesses left worse than right.  There is a  small left foraminal disc herniation at L2-3 that could focally irritate the left L2 nerve root.  IMPRESSION: Sub-centimeter metastases within the right pedicle of L1, the vertebral body at L1, the right pedicle at L2, the central S2 segment and the left sided S2 segment.  2.5 cm metastasis anteriorly within the L2 vertebral body, at the site of the pathologic endplate Schmorl's node.  No neural compression by tumor.  Degenerative changes which could be a cause of back pain. Bilateral facet arthropathy at L2-3.  Lateral recess stenosis at L4- 5 left worse than right.  Left foraminal encroachment by small disc herniation at L2-3.   Original Report Authenticated By: Paulina Fusi, M.D.   Dg Shoulder Left  01/08/2013   *RADIOLOGY REPORT*  Clinical Data: Worsening left shoulder pain for 3-4 months.  LEFT SHOULDER - 2+ VIEW  Comparison: Plain films left shoulder 07/02/2012.  Findings: The humerus is located and the acromioclavicular joint is intact.  Mild acromioclavicular degenerative change is again seen. There is no fracture.  Imaged left lung and ribs appear clear.  IMPRESSION: No acute finding.  Mild acromioclavicular osteoarthritis.  Stable compared to prior exam.   Original Report Authenticated By: Holley Dexter, M.D.    ASSESSMENT AND PLAN: This is a very pleasant 62 years old African American female with history of multiple myeloma currently undergoing systemic treatment with Velcade and Decadron status post 10 weekly doses and tolerating it fairly well. Her recent MRI of the lumbar spine showed no significant evidence for disease progression but persistent metastatic lesions with no cord compression. She has no evidence for progressive disease in the left shoulder. The patient also has no evidence for disease progression based on the recent myeloma panel. Patient was discussed with and seen by Dr. Arbutus Ped. She'll  continue with her weekly labs and subcutaneous Velcade as scheduled. She'll return in 3 weeks for a another symptom management visit with a repeat CBC differential, C. met and LDH.  Laural Benes, Kaysey Berndt E, [name]   The patient was advised to call immediately if she has any concerning symptoms in the interval.  All questions were answered. The patient knows to call the clinic with any problems, questions or concerns. We can certainly see the patient much sooner if necessary.  ADDENDUM: Hematolohy/Oncology Attending: I have a face to face encounter with the patient today. I recommended for her to plan. The patient is doing fine today with no specific complaints. She is tolerating her treatment with Velcade and Decadron fairly well. Her last myeloma panel showed no evidence for disease progression. She will continue with her treatment with Velcade and Decadron as scheduled. She would come back for follow up visit in 3 weeks for reevaluation. Lajuana Matte., MD 02/04/2013

## 2013-02-08 ENCOUNTER — Encounter: Payer: Self-pay | Admitting: Internal Medicine

## 2013-02-08 NOTE — Progress Notes (Signed)
Put disability form on nurse's desk. °

## 2013-02-09 ENCOUNTER — Encounter: Payer: Self-pay | Admitting: Internal Medicine

## 2013-02-09 NOTE — Progress Notes (Signed)
Faxed disability form to [employer] @ [phone] °

## 2013-02-11 ENCOUNTER — Ambulatory Visit (HOSPITAL_BASED_OUTPATIENT_CLINIC_OR_DEPARTMENT_OTHER): Payer: Medicare Other

## 2013-02-11 ENCOUNTER — Other Ambulatory Visit (HOSPITAL_BASED_OUTPATIENT_CLINIC_OR_DEPARTMENT_OTHER): Payer: Medicare Other | Admitting: Lab

## 2013-02-11 VITALS — BP 110/65 | HR 91 | Temp 97.7°F | Resp 18

## 2013-02-11 DIAGNOSIS — C9000 Multiple myeloma not having achieved remission: Secondary | ICD-10-CM

## 2013-02-11 DIAGNOSIS — Z5112 Encounter for antineoplastic immunotherapy: Secondary | ICD-10-CM

## 2013-02-11 LAB — COMPREHENSIVE METABOLIC PANEL (CC13)
ALT: 23 U/L (ref 0–55)
Albumin: 3.4 g/dL — ABNORMAL LOW (ref 3.5–5.0)
CO2: 27 mEq/L (ref 22–29)
Chloride: 102 mEq/L (ref 98–109)
Glucose: 105 mg/dl (ref 70–140)
Potassium: 3.8 mEq/L (ref 3.5–5.1)
Sodium: 141 mEq/L (ref 136–145)
Total Bilirubin: 0.93 mg/dL (ref 0.20–1.20)
Total Protein: 7 g/dL (ref 6.4–8.3)

## 2013-02-11 LAB — CBC WITH DIFFERENTIAL/PLATELET
BASO%: 0.1 % (ref 0.0–2.0)
Eosinophils Absolute: 0 10*3/uL (ref 0.0–0.5)
MCHC: 33.8 g/dL (ref 31.5–36.0)
MONO#: 0.9 10*3/uL (ref 0.1–0.9)
NEUT#: 7.5 10*3/uL — ABNORMAL HIGH (ref 1.5–6.5)
Platelets: 210 10*3/uL (ref 145–400)
RBC: 3.73 10*6/uL (ref 3.70–5.45)
RDW: 15.5 % — ABNORMAL HIGH (ref 11.2–14.5)
WBC: 10.2 10*3/uL (ref 3.9–10.3)
lymph#: 1.8 10*3/uL (ref 0.9–3.3)

## 2013-02-11 MED ORDER — BORTEZOMIB CHEMO SQ INJECTION 3.5 MG (2.5MG/ML)
1.3000 mg/m2 | Freq: Once | INTRAMUSCULAR | Status: AC
  Administered 2013-02-11: 2.5 mg via SUBCUTANEOUS
  Filled 2013-02-11: qty 2.5

## 2013-02-11 MED ORDER — ONDANSETRON HCL 8 MG PO TABS
8.0000 mg | ORAL_TABLET | Freq: Once | ORAL | Status: AC
  Administered 2013-02-11: 8 mg via ORAL

## 2013-02-17 ENCOUNTER — Other Ambulatory Visit: Payer: Self-pay | Admitting: *Deleted

## 2013-02-17 DIAGNOSIS — C9000 Multiple myeloma not having achieved remission: Secondary | ICD-10-CM

## 2013-02-18 ENCOUNTER — Ambulatory Visit (HOSPITAL_BASED_OUTPATIENT_CLINIC_OR_DEPARTMENT_OTHER): Payer: Medicare Other

## 2013-02-18 ENCOUNTER — Telehealth: Payer: Self-pay | Admitting: *Deleted

## 2013-02-18 ENCOUNTER — Other Ambulatory Visit (HOSPITAL_BASED_OUTPATIENT_CLINIC_OR_DEPARTMENT_OTHER): Payer: Medicare Other | Admitting: Lab

## 2013-02-18 VITALS — BP 144/79 | HR 100 | Temp 97.7°F

## 2013-02-18 DIAGNOSIS — C9000 Multiple myeloma not having achieved remission: Secondary | ICD-10-CM

## 2013-02-18 DIAGNOSIS — Z5112 Encounter for antineoplastic immunotherapy: Secondary | ICD-10-CM

## 2013-02-18 LAB — COMPREHENSIVE METABOLIC PANEL (CC13)
ALT: 34 U/L (ref 0–55)
Albumin: 3.2 g/dL — ABNORMAL LOW (ref 3.5–5.0)
Alkaline Phosphatase: 112 U/L (ref 40–150)
CO2: 22 mEq/L (ref 22–29)
Potassium: 3.8 mEq/L (ref 3.5–5.1)
Sodium: 140 mEq/L (ref 136–145)
Total Bilirubin: 0.77 mg/dL (ref 0.20–1.20)
Total Protein: 7.1 g/dL (ref 6.4–8.3)

## 2013-02-18 LAB — CBC WITH DIFFERENTIAL/PLATELET
BASO%: 0.4 % (ref 0.0–2.0)
LYMPH%: 5.2 % — ABNORMAL LOW (ref 14.0–49.7)
MCHC: 33.9 g/dL (ref 31.5–36.0)
MONO#: 0.3 10*3/uL (ref 0.1–0.9)
MONO%: 2.1 % (ref 0.0–14.0)
NEUT#: 14.5 10*3/uL — ABNORMAL HIGH (ref 1.5–6.5)
Platelets: 171 10*3/uL (ref 145–400)
RBC: 3.62 10*6/uL — ABNORMAL LOW (ref 3.70–5.45)
RDW: 15.8 % — ABNORMAL HIGH (ref 11.2–14.5)
WBC: 15.7 10*3/uL — ABNORMAL HIGH (ref 3.9–10.3)

## 2013-02-18 MED ORDER — BORTEZOMIB CHEMO SQ INJECTION 3.5 MG (2.5MG/ML)
1.3000 mg/m2 | Freq: Once | INTRAMUSCULAR | Status: AC
  Administered 2013-02-18: 2.5 mg via SUBCUTANEOUS
  Filled 2013-02-18: qty 2.5

## 2013-02-18 MED ORDER — ONDANSETRON HCL 8 MG PO TABS
8.0000 mg | ORAL_TABLET | Freq: Once | ORAL | Status: AC
  Administered 2013-02-18: 8 mg via ORAL

## 2013-02-18 NOTE — Patient Instructions (Signed)
[facility] Discharge Instructions for Patients Receiving Chemotherapy  Today you received the following chemotherapy agents: Velcade.  To help prevent nausea and vomiting after your treatment, we encourage you to take your nausea medication as prescribed.   If you develop nausea and vomiting that is not controlled by your nausea medication, call the clinic.   BELOW ARE SYMPTOMS THAT SHOULD BE REPORTED IMMEDIATELY:  *FEVER GREATER THAN 100.5 F  *CHILLS WITH OR WITHOUT FEVER  NAUSEA AND VOMITING THAT IS NOT CONTROLLED WITH YOUR NAUSEA MEDICATION  *UNUSUAL SHORTNESS OF BREATH  *UNUSUAL BRUISING OR BLEEDING  TENDERNESS IN MOUTH AND THROAT WITH OR WITHOUT PRESENCE OF ULCERS  *URINARY PROBLEMS  *BOWEL PROBLEMS  UNUSUAL RASH Items with * indicate a potential emergency and should be followed up as soon as possible.  Feel free to call the clinic you have any questions or concerns. The clinic phone number is [phone].    

## 2013-02-18 NOTE — Telephone Encounter (Signed)
[facility] staff phone call I have adjusted 8/28 appt

## 2013-02-19 ENCOUNTER — Telehealth: Payer: Self-pay | Admitting: *Deleted

## 2013-02-19 ENCOUNTER — Telehealth: Payer: Self-pay | Admitting: Hematology and Oncology

## 2013-02-19 NOTE — Telephone Encounter (Signed)
Called pt and left message for August and September 2014

## 2013-02-19 NOTE — Telephone Encounter (Signed)
Patient called regarding her appts. I have rescheduled all her appts to follow each other

## 2013-02-24 ENCOUNTER — Encounter (HOSPITAL_COMMUNITY): Payer: Self-pay | Admitting: Emergency Medicine

## 2013-02-24 ENCOUNTER — Inpatient Hospital Stay (HOSPITAL_COMMUNITY): Payer: Medicare Other

## 2013-02-24 ENCOUNTER — Emergency Department (HOSPITAL_COMMUNITY): Payer: Medicare Other

## 2013-02-24 ENCOUNTER — Other Ambulatory Visit: Payer: Self-pay | Admitting: *Deleted

## 2013-02-24 ENCOUNTER — Inpatient Hospital Stay (HOSPITAL_COMMUNITY)
Admission: EM | Admit: 2013-02-24 | Discharge: 2013-02-25 | DRG: 065 | Disposition: A | Discharge status: Home / Self Care | Payer: Medicare Other | Attending: Internal Medicine | Admitting: Internal Medicine

## 2013-02-24 DIAGNOSIS — I10 Essential (primary) hypertension: Secondary | ICD-10-CM | POA: Diagnosis present

## 2013-02-24 DIAGNOSIS — K219 Gastro-esophageal reflux disease without esophagitis: Secondary | ICD-10-CM

## 2013-02-24 DIAGNOSIS — E785 Hyperlipidemia, unspecified: Secondary | ICD-10-CM

## 2013-02-24 DIAGNOSIS — E7800 Pure hypercholesterolemia, unspecified: Secondary | ICD-10-CM | POA: Diagnosis present

## 2013-02-24 DIAGNOSIS — C9000 Multiple myeloma not having achieved remission: Secondary | ICD-10-CM

## 2013-02-24 DIAGNOSIS — R531 Weakness: Secondary | ICD-10-CM

## 2013-02-24 DIAGNOSIS — G459 Transient cerebral ischemic attack, unspecified: Secondary | ICD-10-CM

## 2013-02-24 DIAGNOSIS — F419 Anxiety disorder, unspecified: Secondary | ICD-10-CM | POA: Diagnosis present

## 2013-02-24 DIAGNOSIS — I6529 Occlusion and stenosis of unspecified carotid artery: Secondary | ICD-10-CM | POA: Diagnosis present

## 2013-02-24 DIAGNOSIS — D630 Anemia in neoplastic disease: Secondary | ICD-10-CM | POA: Diagnosis present

## 2013-02-24 DIAGNOSIS — Z923 Personal history of irradiation: Secondary | ICD-10-CM

## 2013-02-24 DIAGNOSIS — E781 Pure hyperglyceridemia: Secondary | ICD-10-CM | POA: Diagnosis present

## 2013-02-24 DIAGNOSIS — Z9221 Personal history of antineoplastic chemotherapy: Secondary | ICD-10-CM

## 2013-02-24 DIAGNOSIS — Z9484 Stem cells transplant status: Secondary | ICD-10-CM

## 2013-02-24 DIAGNOSIS — E782 Mixed hyperlipidemia: Secondary | ICD-10-CM | POA: Diagnosis present

## 2013-02-24 DIAGNOSIS — F411 Generalized anxiety disorder: Secondary | ICD-10-CM

## 2013-02-24 DIAGNOSIS — I6350 Cerebral infarction due to unspecified occlusion or stenosis of unspecified cerebral artery: Principal | ICD-10-CM | POA: Diagnosis present

## 2013-02-24 DIAGNOSIS — M6281 Muscle weakness (generalized): Secondary | ICD-10-CM

## 2013-02-24 DIAGNOSIS — Z79899 Other long term (current) drug therapy: Secondary | ICD-10-CM

## 2013-02-24 DIAGNOSIS — I639 Cerebral infarction, unspecified: Secondary | ICD-10-CM

## 2013-02-24 DIAGNOSIS — E039 Hypothyroidism, unspecified: Secondary | ICD-10-CM

## 2013-02-24 DIAGNOSIS — G8929 Other chronic pain: Secondary | ICD-10-CM

## 2013-02-24 DIAGNOSIS — E119 Type 2 diabetes mellitus without complications: Secondary | ICD-10-CM

## 2013-02-24 DIAGNOSIS — C9002 Multiple myeloma in relapse: Secondary | ICD-10-CM | POA: Diagnosis present

## 2013-02-24 DIAGNOSIS — R29898 Other symptoms and signs involving the musculoskeletal system: Secondary | ICD-10-CM | POA: Diagnosis present

## 2013-02-24 HISTORY — DX: Nausea with vomiting, unspecified: R11.2

## 2013-02-24 HISTORY — DX: Other specified postprocedural states: Z98.890

## 2013-02-24 LAB — CBC WITH DIFFERENTIAL/PLATELET
Basophils Absolute: 0 10*3/uL (ref 0.0–0.1)
Basophils Relative: 0 % (ref 0–1)
Eosinophils Absolute: 0 10*3/uL (ref 0.0–0.7)
Eosinophils Relative: 0 % (ref 0–5)
HCT: 32.8 % — ABNORMAL LOW (ref 36.0–46.0)
Hemoglobin: 10.9 g/dL — ABNORMAL LOW (ref 12.0–15.0)
Lymphocytes Relative: 24 % (ref 12–46)
Lymphs Abs: 1.6 10*3/uL (ref 0.7–4.0)
MCH: 32.4 pg (ref 26.0–34.0)
MCHC: 33.2 g/dL (ref 30.0–36.0)
MCV: 97.6 fL (ref 78.0–100.0)
Monocytes Absolute: 0.7 10*3/uL (ref 0.1–1.0)
Monocytes Relative: 11 % (ref 3–12)
Neutro Abs: 4.4 10*3/uL (ref 1.7–7.7)
Neutrophils Relative %: 65 % (ref 43–77)
Platelets: 154 10*3/uL (ref 150–400)
RBC: 3.36 MIL/uL — ABNORMAL LOW (ref 3.87–5.11)
RDW: 15.6 % — ABNORMAL HIGH (ref 11.5–15.5)
WBC: 6.7 10*3/uL (ref 4.0–10.5)

## 2013-02-24 LAB — COMPREHENSIVE METABOLIC PANEL
ALT: 26 U/L (ref 0–35)
AST: 22 U/L (ref 0–37)
Albumin: 2.8 g/dL — ABNORMAL LOW (ref 3.5–5.2)
Alkaline Phosphatase: 78 U/L (ref 39–117)
BUN: 15 mg/dL (ref 6–23)
CO2: 27 mEq/L (ref 19–32)
Calcium: 8.7 mg/dL (ref 8.4–10.5)
Chloride: 104 mEq/L (ref 96–112)
Creatinine, Ser: 0.97 mg/dL (ref 0.50–1.10)
GFR calc Af Amer: 72 mL/min — ABNORMAL LOW (ref >90)
GFR calc non Af Amer: 62 mL/min — ABNORMAL LOW (ref >90)
Glucose, Bld: 87 mg/dL (ref 70–99)
Potassium: 3.5 mEq/L (ref 3.5–5.1)
Sodium: 139 mEq/L (ref 135–145)
Total Bilirubin: 0.4 mg/dL (ref 0.3–1.2)
Total Protein: 6 g/dL (ref 6.0–8.3)

## 2013-02-24 MED ORDER — ACYCLOVIR 800 MG PO TABS
800.0000 mg | ORAL_TABLET | Freq: Two times a day (BID) | ORAL | Status: DC
  Administered 2013-02-24 – 2013-02-25 (×2): 800 mg via ORAL
  Filled 2013-02-24 (×3): qty 1

## 2013-02-24 MED ORDER — MORPHINE SULFATE ER 15 MG PO TBCR
15.0000 mg | EXTENDED_RELEASE_TABLET | Freq: Once | ORAL | Status: AC
  Administered 2013-02-24: 15 mg via ORAL
  Filled 2013-02-24: qty 1

## 2013-02-24 MED ORDER — ASPIRIN 325 MG PO TABS
325.0000 mg | ORAL_TABLET | Freq: Every day | ORAL | Status: DC
  Administered 2013-02-24 – 2013-02-25 (×2): 325 mg via ORAL
  Filled 2013-02-24 (×2): qty 1

## 2013-02-24 MED ORDER — DICYCLOMINE HCL 20 MG PO TABS
20.0000 mg | ORAL_TABLET | Freq: Two times a day (BID) | ORAL | Status: DC
  Administered 2013-02-24 – 2013-02-25 (×2): 20 mg via ORAL
  Filled 2013-02-24 (×3): qty 1

## 2013-02-24 MED ORDER — SODIUM CHLORIDE 0.9 % IJ SOLN: 3.0000 mL | INTRAMUSCULAR | Status: DC | PRN

## 2013-02-24 MED ORDER — GABAPENTIN 300 MG PO CAPS
600.0000 mg | ORAL_CAPSULE | Freq: Four times a day (QID) | ORAL | Status: DC
  Administered 2013-02-24 – 2013-02-25 (×4): 600 mg via ORAL
  Filled 2013-02-24 (×5): qty 2

## 2013-02-24 MED ORDER — POTASSIUM CHLORIDE CRYS ER 10 MEQ PO TBCR
10.0000 meq | EXTENDED_RELEASE_TABLET | Freq: Two times a day (BID) | ORAL | Status: DC
  Administered 2013-02-24 – 2013-02-25 (×2): 10 meq via ORAL
  Filled 2013-02-24 (×3): qty 1

## 2013-02-24 MED ORDER — ZOLPIDEM TARTRATE 5 MG PO TABS: 5.0000 mg | ORAL_TABLET | Freq: Every evening | ORAL | Status: DC | PRN

## 2013-02-24 MED ORDER — ALPRAZOLAM 0.5 MG PO TABS: 0.5000 mg | ORAL_TABLET | Freq: Three times a day (TID) | ORAL | Status: DC | PRN

## 2013-02-24 MED ORDER — LEVOTHYROXINE SODIUM 100 MCG PO TABS
100.0000 ug | ORAL_TABLET | Freq: Every day | ORAL | Status: DC
  Administered 2013-02-25: 100 ug via ORAL
  Filled 2013-02-24 (×2): qty 1

## 2013-02-24 MED ORDER — FUROSEMIDE 40 MG PO TABS
40.0000 mg | ORAL_TABLET | Freq: Two times a day (BID) | ORAL | Status: DC
  Administered 2013-02-24 – 2013-02-25 (×3): 40 mg via ORAL
  Filled 2013-02-24 (×4): qty 1

## 2013-02-24 MED ORDER — ENOXAPARIN SODIUM 40 MG/0.4ML ~~LOC~~ SOLN
40.0000 mg | SUBCUTANEOUS | Status: DC
  Administered 2013-02-24: 40 mg via SUBCUTANEOUS
  Filled 2013-02-24 (×2): qty 0.4

## 2013-02-24 MED ORDER — GADOBENATE DIMEGLUMINE 529 MG/ML IV SOLN
18.0000 mL | Freq: Once | INTRAVENOUS | Status: AC | PRN
  Administered 2013-02-24: 18 mL via INTRAVENOUS

## 2013-02-24 MED ORDER — SODIUM CHLORIDE 0.9 % IJ SOLN: 3.0000 mL | Freq: Two times a day (BID) | INTRAMUSCULAR | Status: DC

## 2013-02-24 MED ORDER — ADULT MULTIVITAMIN W/MINERALS CH
1.0000 | ORAL_TABLET | Freq: Every day | ORAL | Status: DC
  Administered 2013-02-24 – 2013-02-25 (×2): 1 via ORAL
  Filled 2013-02-24 (×2): qty 1

## 2013-02-24 MED ORDER — PANTOPRAZOLE SODIUM 40 MG PO TBEC
40.0000 mg | DELAYED_RELEASE_TABLET | Freq: Every day | ORAL | Status: DC
  Administered 2013-02-24 – 2013-02-25 (×2): 40 mg via ORAL
  Filled 2013-02-24 (×2): qty 1

## 2013-02-24 MED ORDER — SIMVASTATIN 20 MG PO TABS
20.0000 mg | ORAL_TABLET | Freq: Every day | ORAL | Status: DC
  Administered 2013-02-25: 20 mg via ORAL
  Filled 2013-02-24 (×3): qty 1

## 2013-02-24 MED ORDER — LIDOCAINE-PRILOCAINE 2.5-2.5 % EX CREA
1.0000 "application " | TOPICAL_CREAM | CUTANEOUS | Status: DC | PRN
  Filled 2013-02-24: qty 5

## 2013-02-24 MED ORDER — SODIUM CHLORIDE 0.9 % IV SOLN: 250.0000 mL | INTRAVENOUS | Status: DC | PRN

## 2013-02-24 MED ORDER — ACETAMINOPHEN 325 MG PO TABS: 650.0000 mg | ORAL_TABLET | ORAL | Status: DC | PRN

## 2013-02-24 MED ORDER — MORPHINE SULFATE ER 15 MG PO TBCR
15.0000 mg | EXTENDED_RELEASE_TABLET | Freq: Two times a day (BID) | ORAL | Status: DC
  Administered 2013-02-24 – 2013-02-25 (×2): 15 mg via ORAL
  Filled 2013-02-24 (×2): qty 1

## 2013-02-24 MED ORDER — ONDANSETRON HCL 4 MG/2ML IJ SOLN: 4.0000 mg | Freq: Four times a day (QID) | INTRAMUSCULAR | Status: DC | PRN

## 2013-02-24 NOTE — H&P (Signed)
[facility] History and Physical  [name] WUJ:[id number] DOB: [birth year] DOA: 02/24/2013  Referring physician: Dr. Adriana Simas PCP: [name], R, [name]  Specialists: Dr. Arbutus Ped (oncologist)  Chief Complaint: right side weakness and worsening numbness  HPI: [name] is a 62 y.o. female with pmh significant for HTN, HLD, hypothyroidism, GERD and multiple myeloma (actively receiving velcade, and with hx of radiation therapy to c-spine, T2-T6 in the past); came to the hospital with acute right side weakness. Patient reports symptoms are affecting more her right arm and she is concern especially for inability of holding things properly; decrease grip. She endorses that her chronic numbness affecting right upper and lower extremity is also worse. Denies CP, fever, chills, nausea, vomiting, abd pain; melena, hematochezia or any other complaints. In ED CT head negative for hemorrhagic or ischemic findings; but demonstrated enlargement of lytic lesions affecting the calvarium area. [facility] called to admit patient for further evaluation and treatment of right side weakness.   Review of Systems:  Negative except as otherwise mentioned on HPI  Past Medical History  Diagnosis Date  . Thyroid disease Hypothyroidism  . Hypercholesterolemia   . Compression fracture 04/08/2007    pathologic compression fracture  . Hypothyroidism   . FHx: chemotherapy     s/p 5 cycle revlimid/low dose decadron,s/p velcade,doxil,decadron,  . Hx of radiation therapy 05/05/07-05/18/07,& 03/05/11-03/21/11-    l3&l5 in 2008, t2-t6 03/2011  . GERD (gastroesophageal reflux disease)   . Insomnia     associated with steroids  . Nausea   . Constipation     takes oxycontin,vicodin  . Hx of radiation therapy 05/05/2007 to 05/18/2007    palliative, L3-5  . 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 Hx of radiation therapy 03/05/2011 to 03/21/2011    palliative T2-T6, c-spine  . 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 PONV (postoperative nausea and vomiting)    Past Surgical History  Procedure Laterality Date  . Cholecystectomy    . Knee surgery    . Knee surgery    . Video bronchoscopy  07/30/2011    Procedure: VIDEO BRONCHOSCOPY WITHOUT FLUORO;  Surgeon: Barbaraann Share, MD;  Location: [facility];  Service: Cardiopulmonary;  Laterality: Bilateral;   Social History:  reports that she quit smoking about 34 years ago. She has never used smokeless tobacco. She reports that she does not drink alcohol or use illicit drugs.   No Known Allergies  Family History  Problem Relation Age of Onset  . Cancer Brother     lung ca  . Cancer Maternal Uncle     colon  . Cancer Maternal Grandmother     breast ca   Prior to Admission medications   Medication Sig Start Date End Date Taking? Authorizing Provider  acyclovir (ZOVIRAX) 800 MG tablet Take 800 mg by mouth 2 (two) times daily.    Yes Historical Provider, MD  ALPRAZolam Prudy Feeler) 0.5 MG tablet Take 0.5 mg by mouth 3 (three) times daily as needed for anxiety.  05/06/12  Yes Historical Provider, MD  dexamethasone (DECADRON) 4 MG tablet Take 40 mg by mouth once a week. Taken on Thursdays.   Yes Historical Provider, MD  dicyclomine (BENTYL) 20 MG tablet Take 20 mg by mouth 2 (two) times daily.  11/03/12  Yes Historical Provider, MD  ESZOPICLONE 3 MG tablet Take 3 mg by mouth at bedtime.  07/08/11  Yes Historical Provider, MD  furosemide (LASIX)  40 MG tablet Take 40 mg by mouth 2 (two) times daily. 01/14/13  Yes Lucky Cowboy, MD  gabapentin (NEURONTIN) 300 MG capsule Take 600 mg by mouth 4 (four) times daily.  01/12/13  Yes Lucky Cowboy, MD  levothyroxine (SYNTHROID, LEVOTHROID) 100 MCG tablet Take 100 mcg by mouth daily.    Yes Amy Elisabeth Most, DO  lidocaine-prilocaine (EMLA) cream Apply 1 application topically as needed (for port-a-cath access).   Yes Historical Provider, MD  loperamide (IMODIUM) 2 MG capsule Take 2 mg by mouth 4  (four) times daily as needed for diarrhea or loose stools.   Yes Historical Provider, MD  morphine (MS CONTIN) 15 MG 12 hr tablet Take 1 tablet (15 mg total) by mouth 2 (two) times daily. 01/25/13  Yes Si Gaul, MD  Multiple Vitamin (MULITIVITAMIN WITH MINERALS) TABS Take 1 tablet by mouth daily.   Yes Historical Provider, MD  ondansetron (ZOFRAN-ODT) 4 MG disintegrating tablet Take 1 tablet by mouth every 8 (eight) hours as needed for nausea.  11/30/12  Yes Historical Provider, MD  pantoprazole (PROTONIX) 40 MG tablet Take 40 mg by mouth daily.   Yes Historical Provider, MD  potassium chloride (K-DUR,KLOR-CON) 10 MEQ tablet Take 10 mEq by mouth 2 (two) times daily. 01/19/13  Yes [name] R [name], [name]  PRESCRIPTION MEDICATION Inject into the vein every 7 (seven) days. Velcade infusion every Thursday.   Yes Historical Provider, MD   Physical Exam: Filed Vitals:   02/24/13 1527  BP:   Pulse:   Temp: 99 F (37.2 C)  Resp:      General:  No dysrathria, no fever, cooperative to examination, no facial droop; AAOX3  Eyes: PERRL, no nystagmus, no icterus, EOMI  ENT: no erythema, no exudates, no thrush; MMM; no drainage out of ears or nostrils  Neck: supple, no JVD, no thyromegaly  Cardiovascular: no murmurs, no rubs, no gallops; S1 and S2 appreciated on exam  Respiratory: clear to auscultation bilaterally; no rales, no crackles or wheezing  Abdomen: soft, NT, ND, positive BS  Skin: no rash, no bruises, no petechiae  Musculoskeletal: no joint swelling or erythema; no LE edema on exam  Psychiatric: stable, no hallucinations. No SI  Neurologic: CN intact, AAOX3, MS left side upper and lower extremities 5/5; right side upper and lower extremities 3-4/5 (affecting upper ext > lower ext); patient also reports decrease neuro sensation light touch and pinprick on her right arm; decrease grip, no pronator drift, normal finger to nose    Labs on Admission:  Basic Metabolic Panel:  Recent  Labs Lab 02/18/13 1315 02/24/13 1500  NA 140 139  K 3.8 3.5  CL  --  104  CO2 22 27  GLUCOSE 138 87  BUN 15.3 15  CREATININE 1.1 0.97  CALCIUM 9.5 8.7   Liver Function Tests:  Recent Labs Lab 02/18/13 1315 02/24/13 1500  AST 19 22  ALT 34 26  ALKPHOS 112 78  BILITOT 0.77 0.4  PROT 7.1 6.0  ALBUMIN 3.2* 2.8*   CBC:  Recent Labs Lab 02/18/13 1314 02/24/13 1500  WBC 15.7* 6.7  NEUTROABS 14.5* 4.4  HGB 11.9 10.9*  HCT 35.2 32.8*  MCV 97.1 97.6  PLT 171 154    BNP (last 3 results)  Recent Labs  05/31/12 1600  PROBNP 118.8    Radiological Exams on Admission: Ct Head (brain) Wo Contrast  02/24/2013   *RADIOLOGY REPORT*  Clinical Data: Right-sided weakness. Multiple myeloma.  CT HEAD WITHOUT CONTRAST  Technique:  Contiguous  axial images were obtained from the base of the skull through the vertex without contrast.  Comparison: 07/20/2011.  Findings: No evidence of an acute infarct, acute hemorrhage, mass lesion, mass effect or hydrocephalus.  There are lytic lesions in the calvarium with the largest seen in the right parietal bone, measuring approximately 0.9 x 3.7 cm (previously 0.9 x 1.7 cm).  Other lesions are stable or minimally larger.  Visualized portions of the paranasal sinuses and mastoid air cells are clear.  IMPRESSION:  1.  No acute intracranial abnormality. 2.  Lytic lesions in the calvarium, some of which are larger than on 07/19/2011, in this patient with a history of multiple myeloma.   Original Report Authenticated By: Leanna Battles, M.D.    Assessment/Plan 1-Right side weakness: concerns for TIA (transient ischemic attack) vs CVA vs progression of MM affecting her cervical spine and causing spinal cord compression. -will admit to telemetry -will get MRI head w/o contrast, carotid dopplers, 2-D echo, MRI cervical spine, TSH and B12 -will also check A1C, lipid panel for stratification  -will start ASA PO daily -PT, OT Patient has passed bedside  evaluation and will be started on heart healthy diet -will also start statins -Neurology has been consulted    2-MULTIPLE  MYELOMA: per oncology service. Following recommendations will hold velcade this week and follow with oncology after discharge. -will follow results of cervical spine MRI -CT head and recent CT abd and pelvis demonstrated enlargement of lytic lesions affecting calvarium and left ischial bone, which means active disease.  3-HYPOTHYROIDISM: will check TSH and continue synthroid  4-GERD: will continue PPI  5-HYPERTENSION, HX OF: stable. Will continue home regimen and low sodium diet. BP is stable.  6-HLD (hyperlipidemia): will check lipid panel and tentatively start zocor  7-Anxiety: continue PRN xanax    8-Chronic pain: stable. 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 Hgb stable. No transfusion needed. Will monitor  [id number]  Oncology (Dr. Arbutus Ped notified of admission) Neurology (Dr. Thad Ranger)  Code Status: Full Family Communication: daughter at bedside Disposition Plan: inpatient, telemetry, LOS >2 midnights   Time spent: 55 minutes  [name] [facility] Pager [phone]  If 7PM-7AM, please contact night-coverage [email or web address] Password [facility] 02/24/2013, 4:44 PM

## 2013-02-24 NOTE — ED Notes (Signed)
Pt arrived to ED via POV with a complaint of weakness.  Pt was sent here by her Doctors office to rule out CUA.  Pt is having right side weakness that started at 0300 hrs today.  Pt has weakness in her right arm and hand.  Pt does have weakness in her right leg due to a bone marrow transplant in 2009

## 2013-02-24 NOTE — ED Provider Notes (Signed)
CSN: [id number]     Arrival date & time 02/24/13  1249 History   First MD Initiated Contact with Patient 02/24/13 1334     Chief Complaint  Patient presents with  . Extremity Weakness   (Consider location/radiation/quality/duration/timing/severity/associated sxs/prior Treatment) HPI 62 year old female with Multiple myeloma presents with right sided weakness with began at ~ 0300 this am.   She reports that this morning she went to the restroom and noted right sided weakness.  She was unable to hold objects in her left hand and also had numbness/tingling in her left leg. She subsequently went back to sleep.  After waking up she had continued weakness and left LE numbness/tingling.   No associated headache.  She does report that she has had some neck pain and back pain.  No other reported deficits.   Past Medical History  Diagnosis Date  . Thyroid disease Hypothyroidism  . Hypercholesterolemia   . Compression fracture 04/08/2007    pathologic compression fracture  . Hypothyroidism   . FHx: chemotherapy     s/p 5 cycle revlimid/low dose decadron,s/p velcade,doxil,decadron,  . Hx of radiation therapy 05/05/07-05/18/07,& 03/05/11-03/21/11-    l3&l5 in 2008, t2-t6 03/2011  . GERD (gastroesophageal reflux disease)   . Insomnia     associated with steroids  . Nausea   . Constipation     takes oxycontin,vicodin  . Hx of radiation therapy 05/05/2007 to 05/18/2007    palliative, L3-5  . 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 Hx of radiation therapy 03/05/2011 to 03/21/2011    palliative T2-T6, c-spine  . History of autologous stem cell transplant 11/20/2007    [facility], Dr Vicente Serene   Past Surgical History  Procedure Laterality Date  . Cholecystectomy    . Knee surgery    . Knee surgery    . Video bronchoscopy  07/30/2011    Procedure: VIDEO BRONCHOSCOPY WITHOUT FLUORO;  Surgeon: Barbaraann Share, MD;  Location: [facility];  Service: Cardiopulmonary;  Laterality: Bilateral;   Family History  Problem  Relation Age of Onset  . Cancer Brother     lung ca  . Cancer Maternal Uncle     colon  . Cancer Maternal Grandmother     breast ca   History  Substance Use Topics  . Smoking status: Former Smoker -- 0.25 packs/day for 6 years    Quit date: 08/27/1978  . Smokeless tobacco: Never Used  . Alcohol Use: No   OB History   Grav Para Term Preterm Abortions TAB SAB Ect Mult Living                 Review of Systems  Constitutional: Negative for fever and chills.  HENT: Positive for neck pain.   Respiratory: Positive for shortness of breath.   Cardiovascular: Negative for chest pain.  Gastrointestinal: Positive for abdominal pain. Negative for nausea and vomiting.  Genitourinary: Negative.   Musculoskeletal: Positive for back pain.  Skin: Negative for rash.  Neurological: Positive for dizziness, weakness and light-headedness. Negative for headaches.    Allergies  Review of patient's allergies indicates no known allergies.  Home Medications   Current Outpatient Rx  Name  Route  Sig  Dispense  Refill  . acyclovir (ZOVIRAX) 800 MG tablet   Oral   Take 800 mg by mouth 2 (two) times daily.          Marland Kitchen ALPRAZolam (XANAX) 0.5 MG tablet   Oral   Take 0.5 mg by mouth 3 (  three) times daily as needed for anxiety.          Marland Kitchen dexamethasone (DECADRON) 4 MG tablet   Oral   Take 40 mg by mouth once a week. Taken on Thursdays.         Marland Kitchen dicyclomine (BENTYL) 20 MG tablet   Oral   Take 20 mg by mouth 2 (two) times daily.          Marland Kitchen ESZOPICLONE 3 MG tablet   Oral   Take 3 mg by mouth at bedtime.          . furosemide (LASIX) 40 MG tablet   Oral   Take 40 mg by mouth 2 (two) times daily.         Marland Kitchen gabapentin (NEURONTIN) 300 MG capsule   Oral   Take 600 mg by mouth 4 (four) times daily.          Marland Kitchen levothyroxine (SYNTHROID, LEVOTHROID) 100 MCG tablet   Oral   Take 100 mcg by mouth daily.          Marland Kitchen lidocaine-prilocaine (EMLA) cream   Topical   Apply 1  application topically as needed (for port-a-cath access).         Marland Kitchen loperamide (IMODIUM) 2 MG capsule   Oral   Take 2 mg by mouth 4 (four) times daily as needed for diarrhea or loose stools.         Marland Kitchen morphine (MS CONTIN) 15 MG 12 hr tablet   Oral   Take 1 tablet (15 mg total) by mouth 2 (two) times daily.   60 tablet   0   . Multiple Vitamin (MULITIVITAMIN WITH MINERALS) TABS   Oral   Take 1 tablet by mouth daily.         . ondansetron (ZOFRAN-ODT) 4 MG disintegrating tablet   Oral   Take 1 tablet by mouth every 8 (eight) hours as needed for nausea.          . pantoprazole (PROTONIX) 40 MG tablet   Oral   Take 40 mg by mouth daily.         . potassium chloride (K-DUR,KLOR-CON) 10 MEQ tablet   Oral   Take 10 mEq by mouth 2 (two) times daily.         Marland Kitchen PRESCRIPTION MEDICATION   Intravenous   Inject into the vein every 7 (seven) days. Velcade infusion every Thursday.          BP 136/85  Pulse 86  Temp(Src) 99.1 F (37.3 C) (Oral)  Resp 16  SpO2 97% Physical Exam  Constitutional: She is oriented to person, place, and time. She appears well-developed and well-nourished. No distress.  HENT:  Head: Normocephalic and atraumatic.  Mouth/Throat: No oropharyngeal exudate.  Eyes: Conjunctivae and EOM are normal. Pupils are equal, round, and reactive to light. No scleral icterus.  Neck: Neck supple.  Cardiovascular: Normal rate and regular rhythm.   No murmur heard. Pulmonary/Chest: Effort normal and breath sounds normal. No respiratory distress. She has no wheezes. She has no rales.  Abdominal: Soft. She exhibits no distension. There is no tenderness.  Musculoskeletal: She exhibits no edema.  Neurological: She is alert and oriented to person, place, and time. No cranial nerve deficit.  Right upper extremity and right lower extremity muscle strength decreased - 4/5.  Muscle 5/5 in left upper and lower extremity.   Skin: Skin is warm and dry.  Psychiatric: She  has a normal mood and affect.  ED Course  Procedures (including critical care time) EKG:  Date: 02/24/2013  Rate: 76  Rhythm: normal sinus rhythm  QRS Axis: normal  Intervals: normal  ST/T Wave abnormalities: normal  Conduction Disutrbances:none  Old EKG Reviewed: unchanged  Labs Review Labs Reviewed  CBC WITH DIFFERENTIAL  COMPREHENSIVE METABOLIC PANEL   Imaging Review Ct Head (brain) Wo Contrast  02/24/2013   *RADIOLOGY REPORT*  Clinical Data: Right-sided weakness. Multiple myeloma.  CT HEAD WITHOUT CONTRAST  Technique:  Contiguous axial images were obtained from the base of the skull through the vertex without contrast.  Comparison: 07/20/2011.  Findings: No evidence of an acute infarct, acute hemorrhage, mass lesion, mass effect or hydrocephalus.  There are lytic lesions in the calvarium with the largest seen in the right parietal bone, measuring approximately 0.9 x 3.7 cm (previously 0.9 x 1.7 cm).  Other lesions are stable or minimally larger.  Visualized portions of the paranasal sinuses and mastoid air cells are clear.  IMPRESSION:  1.  No acute intracranial abnormality. 2.  Lytic lesions in the calvarium, some of which are larger than on 07/19/2011, in this patient with a history of multiple myeloma.   Original Report Authenticated By: Leanna Battles, M.D.    MDM  No diagnosis found.  62 year old female with multiple myeloma presents with right side weakness concerning for stroke.  - Patient outside of [facility] (occurred at 3 am this morning) - Obtaining stroke work up: CBC, CMP, CT head without contrast.   1445 - CT negative for acute abnormality.  It did reveal lytic lesions of the calvarium from Multiple Myeloma. Awaiting labs.   1530 - Patient to be admitted for stroke workup.  I spoke with Dr. Thad Ranger (Neurology) who recommended transfer to [facility].  Hospitalist to admit.    Tommie Sams, DO 02/24/13 1539

## 2013-02-24 NOTE — Progress Notes (Signed)
Patient activated My Chart. Briscoe Burns BSN, RN-BC Admissions RN 02/24/2013 3:53 PM

## 2013-02-25 ENCOUNTER — Other Ambulatory Visit: Payer: Medicare Other | Admitting: Lab

## 2013-02-25 ENCOUNTER — Ambulatory Visit: Payer: Medicare Other | Admitting: Physician Assistant

## 2013-02-25 ENCOUNTER — Ambulatory Visit: Payer: Medicare Other

## 2013-02-25 DIAGNOSIS — G459 Transient cerebral ischemic attack, unspecified: Secondary | ICD-10-CM

## 2013-02-25 DIAGNOSIS — M6281 Muscle weakness (generalized): Secondary | ICD-10-CM

## 2013-02-25 DIAGNOSIS — E119 Type 2 diabetes mellitus without complications: Secondary | ICD-10-CM

## 2013-02-25 DIAGNOSIS — E785 Hyperlipidemia, unspecified: Secondary | ICD-10-CM

## 2013-02-25 DIAGNOSIS — C9000 Multiple myeloma not having achieved remission: Secondary | ICD-10-CM

## 2013-02-25 DIAGNOSIS — I6350 Cerebral infarction due to unspecified occlusion or stenosis of unspecified cerebral artery: Principal | ICD-10-CM

## 2013-02-25 LAB — BASIC METABOLIC PANEL
CO2: 27 mEq/L (ref 19–32)
Calcium: 8.9 mg/dL (ref 8.4–10.5)
Creatinine, Ser: 0.8 mg/dL (ref 0.50–1.10)
GFR calc non Af Amer: 78 mL/min — ABNORMAL LOW (ref >90)
Glucose, Bld: 115 mg/dL — ABNORMAL HIGH (ref 70–99)
Sodium: 138 mEq/L (ref 135–145)

## 2013-02-25 LAB — HEMOGLOBIN A1C: Hgb A1c MFr Bld: 6.4 % — ABNORMAL HIGH (ref <5.7)

## 2013-02-25 LAB — LIPID PANEL
HDL: 59 mg/dL (ref >39)
LDL Cholesterol: UNDETERMINED mg/dL (ref 0–99)
Total CHOL/HDL Ratio: 4.8 RATIO

## 2013-02-25 LAB — RAPID URINE DRUG SCREEN, HOSP PERFORMED
Cocaine: NOT DETECTED
Opiates: POSITIVE — AB

## 2013-02-25 LAB — CBC
MCH: 32 pg (ref 26.0–34.0)
MCHC: 32.6 g/dL (ref 30.0–36.0)
MCV: 97.9 fL (ref 78.0–100.0)
Platelets: 157 10*3/uL (ref 150–400)
RBC: 3.41 MIL/uL — ABNORMAL LOW (ref 3.87–5.11)

## 2013-02-25 LAB — TSH: TSH: 5.185 u[IU]/mL — ABNORMAL HIGH (ref 0.350–4.500)

## 2013-02-25 MED ORDER — CLOPIDOGREL BISULFATE 75 MG PO TABS: 75.0000 mg | ORAL_TABLET | Freq: Every day | ORAL | Status: DC

## 2013-02-25 MED ORDER — HEPARIN SOD (PORK) LOCK FLUSH 100 UNIT/ML IV SOLN
500.0000 [IU] | INTRAVENOUS | Status: AC | PRN
  Administered 2013-02-25: 500 [IU]

## 2013-02-25 MED ORDER — GLUCOSE BLOOD VI STRP: ORAL_STRIP | Status: DC

## 2013-02-25 MED ORDER — ATORVASTATIN CALCIUM 40 MG PO TABS: 40.0000 mg | ORAL_TABLET | Freq: Every day | ORAL | Status: DC

## 2013-02-25 MED ORDER — METFORMIN HCL 500 MG PO TABS: 250.0000 mg | ORAL_TABLET | Freq: Two times a day (BID) | ORAL | Status: DC

## 2013-02-25 MED ORDER — CLOPIDOGREL BISULFATE 75 MG PO TABS
75.0000 mg | ORAL_TABLET | Freq: Every day | ORAL | Status: DC
  Administered 2013-02-25: 75 mg via ORAL
  Filled 2013-02-25 (×2): qty 1

## 2013-02-25 NOTE — Discharge Summary (Addendum)
Physician Discharge Summary  [name] ZOX:[id number] DOB: Aug 05, 1950 DOA: 02/24/2013  PCP: [name], R, [name]  Admit date: 02/24/2013 Discharge date: 02/25/2013  Time spent: >30 minutes  Recommendations for Outpatient Follow-up:  1. Follow diabetic status and adjust medications as needed for good control 2. Repeat BMET to follow electrolytes and kidney function 3. Reassess BP and adjust medications as needed 4. CBC to follow Hgb 5. LFT's in 8 weeks since patient started on statins 6. Lipid panel with direct LDL in 8-10 weeks  Discharge Diagnoses:  Left frontal CVA Right sided weakness MULTIPLE  MYELOMA HYPOTHYROIDISM GERD HYPERTENSION, HX OF HLD (hyperlipidemia) hypertriglyceridemia  Diabetes mellitus type 2 Anxiety Chronic pain Mild ICA stenosis bilaterally   Discharge Condition: stable and improved. Patient discharged on plavix for secondary prevention and also medications for newly diagnosis of DM and statins for HLD. No physical therapy or occupational therapy provided as her residual deficit was insignificant and she was already participating with PT as an outpatient for deconditioning due to her MM. Patient with active disease from her MM and will continue close follow up with her oncologist.  Diet recommendation: heart healthy diet (low sodium and low fat); also low carbohydrates  Filed Weights   02/24/13 1745  Weight: 89.4 kg (197 lb 1.5 oz)    History of present illness:  62 y.o. female with pmh significant for HTN, HLD, hypothyroidism, GERD and multiple myeloma (actively receiving velcade, and with hx of radiation therapy to c-spine, T2-T6 in the past); came to the hospital with acute right side weakness. Patient reports symptoms are affecting more her right arm and she is concern especially for inability of holding things properly; decrease grip. She endorses that her chronic numbness affecting right upper and lower extremity is also worse. Denies CP,  fever, chills, nausea, vomiting, abd pain; melena, hematochezia or any other complaints. In ED CT head negative for hemorrhagic or ischemic findings; but demonstrated enlargement of lytic lesions affecting the calvarium area. [facility] called to admit patient for further evaluation and treatment of right side weakness.   Hospital Course:  1-Right side weakness due to acute left frontal CVA:   -Patient endorses significant improvement and just subjective residual weakness on her right hand grip at discharge. After examining patient her strength is basically back to normal and after discussing with her she doesn't want any further PT or OT. (patient also reports she was already attending outpatient Physical therapy on her own to help generalized deconditioning from multiple myeloma). -plavix for secondary prevention -modify risk factors -B12, TSH WNL -discharge on treatment for diabetes and statins for HLD.  2-MULTIPLE MYELOMA: per oncology service. Following recommendations will hold velcade this week and follow with oncology after discharge.  -CT head and recent CT abd and pelvis demonstrated enlargement of lytic lesions affecting calvarium and left ischial bone, which means active disease.  -MRI also suggesting large lesion penetrating parietal dura -continue treatment with oncology   3-HYPOTHYROIDISM: continue synthroid TSH WNL  4-GERD: will continue PPI   5-HYPERTENSION, HX OF: stable. Will continue home regimen and low sodium diet. BP is stable.   6-HLD (hyperlipidemia)a nd high triglycerides: started on lipitor for better control of her cholesterol level and also to help with triglycerides  7-Anxiety: continue PRN xanax   8-Chronic pain: stable. 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 Hgb stable. No transfusion needed. Will monitor  10-new diagnosis of diabetes: started on low dose metformin. That will help preventing diabetes  to progress and also with help with  elevated triglycerides; glucometer and supplies to check CBG's at home prescribed  11-mild ICA stenosis: patient will be on statins and plavix. Non occlusive and do not required further treatment at this point.   Procedures:  See below fro x-ray reports  2-D echo with no wall motion abnormalities EF 50%, grade 1 diastolic heart failure  Carotid Duplex: positive for mild ICA stenosis bilaterally, 39%; vertebral flow antegrade  Consultations:  Neurology   PT/OT  Discharge Exam: Filed Vitals:   02/25/13 1400  BP: 120/68  Pulse: 80  Temp: 98.2 F (36.8 C)  Resp: 18    General: NAD, afebrile, AAOX3 Cardiovascular: S1 and S2, no rubs or gallops Respiratory: CTA bilaterally Abd: soft, NT, ND, positive BS Extremities: trace edema bilaterally Neuro: grossly intact; patient endorses that she feel a little weakness on her right hand grip, but significantly improved from admission.  Discharge Instructions  Discharge Orders   Future Appointments Provider Department Dept Phone   03/04/2013 10:30 AM Delcie Roch [facility] MEDICAL ONCOLOGY [phone]   03/04/2013 11:00 AM [facility] [phone]   Future Orders Complete By Expires   Discharge instructions  As directed    Comments:     Follow a low sodium diet Follow low fat diet and low carbohydrates diet Follow with PCP in 1 week Take medications as prescribed   For home use only DME Glucometer  As directed        Medication List         acyclovir 800 MG tablet  Commonly known as:  ZOVIRAX  Take 800 mg by mouth 2 (two) times daily.     ALPRAZolam 0.5 MG tablet  Commonly known as:  XANAX  Take 0.5 mg by mouth 3 (three) times daily as needed for anxiety.     atorvastatin 40 MG tablet  Commonly known as:  LIPITOR  Take 1 tablet (40 mg total) by mouth daily.     clopidogrel 75 MG tablet  Commonly known as:  PLAVIX  Take 1 tablet (75 mg total) by mouth  daily with breakfast.     dexamethasone 4 MG tablet  Commonly known as:  DECADRON  Take 40 mg by mouth once a week. Taken on Thursdays.     dicyclomine 20 MG tablet  Commonly known as:  BENTYL  Take 20 mg by mouth 2 (two) times daily.     eszopiclone 3 MG Tabs  Generic drug:  Eszopiclone  Take 3 mg by mouth at bedtime.     furosemide 40 MG tablet  Commonly known as:  LASIX  Take 40 mg by mouth 2 (two) times daily.     gabapentin 300 MG capsule  Commonly known as:  NEURONTIN  Take 600 mg by mouth 4 (four) times daily.     glucose blood test strip  Use as instructed     levothyroxine 100 MCG tablet  Commonly known as:  SYNTHROID, LEVOTHROID  Take 100 mcg by mouth daily.     lidocaine-prilocaine cream  Commonly known as:  EMLA  Apply 1 application topically as needed (for port-a-cath access).     loperamide 2 MG capsule  Commonly known as:  IMODIUM  Take 2 mg by mouth 4 (four) times daily as needed for diarrhea or loose stools.     metFORMIN 500 MG tablet  Commonly known as:  GLUCOPHAGE  Take 0.5 tablets (250 mg total)  by mouth 2 (two) times daily with a meal.     morphine 15 MG 12 hr tablet  Commonly known as:  MS CONTIN  Take 1 tablet (15 mg total) by mouth 2 (two) times daily.     multivitamin with minerals Tabs tablet  Take 1 tablet by mouth daily.     ondansetron 4 MG disintegrating tablet  Commonly known as:  ZOFRAN-ODT  Take 1 tablet by mouth every 8 (eight) hours as needed for nausea.     pantoprazole 40 MG tablet  Commonly known as:  PROTONIX  Take 40 mg by mouth daily.     potassium chloride 10 MEQ tablet  Commonly known as:  K-DUR,KLOR-CON  Take 10 mEq by mouth 2 (two) times daily.     PRESCRIPTION MEDICATION  Inject into the vein every 7 (seven) days. Velcade infusion every Thursday.       No Known Allergies     Follow-up Information   Follow up with [name], [name], R, [name]. Schedule an appointment as soon as possible for a visit in 1  week.   Specialty:  Emergency Medicine   Contact information:   [address], [address] [place] [address] [phone]        The results of significant diagnostics from this hospitalization (including imaging, microbiology, ancillary and laboratory) are listed below for reference.    Significant Diagnostic Studies: Ct Head (brain) Wo Contrast  02/24/2013   *RADIOLOGY REPORT*  Clinical Data: Right-sided weakness. Multiple myeloma.  CT HEAD WITHOUT CONTRAST  Technique:  Contiguous axial images were obtained from the base of the skull through the vertex without contrast.  Comparison: 07/20/2011.  Findings: No evidence of an acute infarct, acute hemorrhage, mass lesion, mass effect or hydrocephalus.  There are lytic lesions in the calvarium with the largest seen in the right parietal bone, measuring approximately 0.9 x 3.7 cm (previously 0.9 x 1.7 cm).  Other lesions are stable or minimally larger.  Visualized portions of the paranasal sinuses and mastoid air cells are clear.  IMPRESSION:  1.  No acute intracranial abnormality. 2.  Lytic lesions in the calvarium, some of which are larger than on 07/19/2011, in this patient with a history of multiple myeloma.   Original Report Authenticated By: Leanna Battles, M.D.   Mr [facility] Wo Contrast  02/25/2013   *RADIOLOGY REPORT*  Clinical Data:  Right-sided weakness.  Multiple myeloma.  MRI HEAD WITHOUT AND WITH CONTRAST MRA HEAD WITHOUT CONTRAST  Technique:  Multiplanar, multiecho pulse sequences of the brain and surrounding structures were obtained without and with intravenous contrast.  Angiographic images of the head were obtained using MRA technique without contrast.  Contrast: 18mL MULTIHANCE GADOBENATE DIMEGLUMINE 529 MG/ML IV SOLN  Comparison:  CT head 02/24/2013  MRI HEAD WITHOUT AND WITH CONTRAST  Findings:  Small focus of restricted diffusion in the left posterior frontal cortex over the convexity consistent with acute infarct.  No other  acute infarct.  Patchy small white matter hyperintensities bilaterally are nonspecific and may be related to chronic microvascular ischemic change or therapy related change.  Brainstem and cerebellum are normal.  Negative for hemorrhage.  Large myelomatous lesion of the right parietal bone which is expansile.  There is dural thickening enhancement which may be related to tumor infiltration of the dura.  This lesion measures 10 x 40 mm.  There is another 1 cm lesion in the left parietal bone related to myeloma.  1 cm enhancing lesion present in the superior  occipital bone consistent with myeloma.  IMPRESSION: Sub centimeter acute infarct left posterior frontal cortex over the convexity.  Calvarial lesions of myeloma.  Large right parietal lesion with a probable dural invasion.  MRA HEAD  Findings: Both vertebral arteries are widely patent to the basilar. PICA is patent bilaterally.  The basilar is widely patent.  The superior cerebellar and posterior cerebral arteries are patent bilaterally without stenosis.  The internal carotid artery is patent bilaterally.  Anterior and middle cerebral arteries are widely patent without stenosis. Negative for aneurysm.  IMPRESSION:  Negative   Original Report Authenticated By: Janeece Riggers, M.D.   Mr Laqueta Jean ZO Contrast  02/25/2013   *RADIOLOGY REPORT*  Clinical Data:  Right-sided weakness.  Multiple myeloma.  MRI HEAD WITHOUT AND WITH CONTRAST MRA HEAD WITHOUT CONTRAST  Technique:  Multiplanar, multiecho pulse sequences of the brain and surrounding structures were obtained without and with intravenous contrast.  Angiographic images of the head were obtained using MRA technique without contrast.  Contrast: 18mL MULTIHANCE GADOBENATE DIMEGLUMINE 529 MG/ML IV SOLN  Comparison:  CT head 02/24/2013  MRI HEAD WITHOUT AND WITH CONTRAST  Findings:  Small focus of restricted diffusion in the left posterior frontal cortex over the convexity consistent with acute infarct.  No other acute  infarct.  Patchy small white matter hyperintensities bilaterally are nonspecific and may be related to chronic microvascular ischemic change or therapy related change.  Brainstem and cerebellum are normal.  Negative for hemorrhage.  Large myelomatous lesion of the right parietal bone which is expansile.  There is dural thickening enhancement which may be related to tumor infiltration of the dura.  This lesion measures 10 x 40 mm.  There is another 1 cm lesion in the left parietal bone related to myeloma.  1 cm enhancing lesion present in the superior occipital bone consistent with myeloma.  IMPRESSION: Sub centimeter acute infarct left posterior frontal cortex over the convexity.  Calvarial lesions of myeloma.  Large right parietal lesion with a probable dural invasion.  MRA HEAD  Findings: Both vertebral arteries are widely patent to the basilar. PICA is patent bilaterally.  The basilar is widely patent.  The superior cerebellar and posterior cerebral arteries are patent bilaterally without stenosis.  The internal carotid artery is patent bilaterally.  Anterior and middle cerebral arteries are widely patent without stenosis. Negative for aneurysm.  IMPRESSION:  Negative   Original Report Authenticated By: Janeece Riggers, M.D.   Mr Cervical Spine W Wo Contrast  02/25/2013   *RADIOLOGY REPORT*  Clinical Data: Multiple myeloma.  Right-sided weakness.  MRI CERVICAL SPINE WITHOUT AND WITH CONTRAST  Technique:  Multiplanar and multiecho pulse sequences of the cervical spine, to include the craniocervical junction and cervicothoracic junction, were obtained according to standard protocol without and with intravenous contrast.  Contrast:  18 ml Multihance.  Comparison: CT 07/02/2012.MRI 02/27/2011.  Findings: Near anatomic alignment cervical spine.  2 mm retrolisthesis of C5 on C6 and 1 mm of retrolisthesis of C6 on C7. Spondylolisthesis appears degenerative in associated with disc disease at both levels.  Cervical cord  demonstrates no intramedullary lesion or cord edema.  There are lesions of multiple myeloma which demonstrate post- gadolinium enhancement scattered throughout the cervical spine. Most prominent of these is on the right side of the C2 vertebra, extending from the odontoid into the C2 body.  There is a larger lesion in the T2 vertebra eccentric to the left and partially visualized T3 lesion present as well.  There is no  pathologic compression fracture.  The prevertebral soft tissues appear normal. Flow voids are present in both vertebral arteries.  There is infiltration of the C7 and C6 spinous processes consistent with myeloma. Disc desiccation is present at every level.  C2-C3:  Negative.  C3-C4:  Shallow broad-based disc osteophyte complex.  No significant central stenosis.  The right foramen is patent.  Left facet arthrosis and uncovertebral spurring produce left foraminal stenosis potentially affecting the left C4 nerve.  C4-C5:  Shallow disc osteophyte complex.  Central canal appears adequately patent.  Mild right foraminal encroachment associated with uncovertebral spurring.  Mild bilateral facet degeneration.  C5-C6:  Moderate central stenosis is present with left eccentric disc osteophyte complex and posterior ligamentum flavum redundancy. There is flattening of the left ventral cord.  The left foraminal stenosis is present secondary the lateral disc protrusion and uncovertebral spurring.  The right foramen appears patent.  C6-C7:  Moderate central stenosis.  Left eccentric disc osteophyte complex flattens the left ventral aspect of the cervical cord. There is left foraminal encroachment due to uncovertebral spurring and lateral disc protrusion potentially affecting the left C7 nerve.  Posterior ligamentum flavum redundancy contributes to central stenosis.  C7-T1:  Disc desiccation without stenosis.  T1-T2:  Shallow broad-based disc bulge without significant stenosis.  IMPRESSION: 1.  Myeloma involvement of  the cervical and thoracic spine detailed above.  No pathologic compression fracture or extraosseous extension of tumor. 2.  Moderate cervical spondylosis with moderate left eccentric central stenosis at C5-C6 and C6-C7.  Left foraminal stenosis is also present.  By comparison, the right-sided stenosis is mild in this patient. 3.  When comparing today's exam to the most recent MRI 02/27/2011, there is improvement in the overall enhancement pattern, with less infiltration suggesting treated myeloma.  The enhancement in the bone marrow adjacent to the plasmacytomas is also less vigorous, again consistent with treatment.   Original Report Authenticated By: Andreas Newport, M.D.   Ct Abdomen Pelvis W Contrast  01/27/2013   *RADIOLOGY REPORT*  Clinical Data: Sudden onset abdominal pain with nausea and vomiting.  Left lower quadrant pain, ? diverticulitis.  CT ABDOMEN AND PELVIS WITH CONTRAST  Technique:  Multidetector CT imaging of the abdomen and pelvis was performed following the standard protocol during bolus administration of intravenous contrast.  Contrast: OMNIPAQUE IOHEXOL 300 MG/ML  SOLN  Comparison: 12/23/2012  Findings:  BODY WALL: Unremarkable.  LOWER CHEST:  Mediastinum: Unremarkable.  Lungs/pleura: No consolidation.  ABDOMEN/PELVIS:  Liver: No focal abnormality.  Biliary: Cholecystectomy.  Pancreas: Unremarkable.  Spleen: Unremarkable.  Adrenals:  11 mm nodule the left adrenal gland, stable since at least 2013 imaging.  Kidneys and ureters: No hydronephrosis or stone.  Bladder: Unremarkable.  Bowel: No obstruction. Normal appendix.  Retroperitoneum: No mass or adenopathy.  Peritoneum: No free fluid or gas.  Reproductive: Hysterectomy.  Vascular: No acute abnormality.  OSSEOUS:  Numerous osteolytic lesions throughout the imaged skeleton.  The patient has undergone recent lumbar spine MRI, showing multiple deposits not seen on this examination.  No indication of acute fracture.  There is remote L4  vertebral body fracturing with compression.  Preexisting L2 superior endplate Schmorl's node, which is pathologic by MRI. When comparing to abdominal CT 05/19/2012, there has been notable enlargement of a lytic lesion within the left ischium, posterior to the acetabulum, now 2.4 cm as compared to proximal 1.2 cm previously.  IMPRESSION:  1.  No acute intra-abdominal findings. 2.  Multiple myeloma.  Compared to 05/19/2012 CT, there is  been enlargement of a left ischial myelomatous deposit, located posterior to the acetabulum, now 2.4 cm.  No acute fractures.   Original Report Authenticated By: Tiburcio Pea   Labs: Basic Metabolic Panel:  Recent Labs Lab 02/24/13 1500 02/25/13 0428  NA 139 138  K 3.5 3.5  CL 104 102  CO2 27 27  GLUCOSE 87 115*  BUN 15 15  CREATININE 0.97 0.80  CALCIUM 8.7 8.9   Liver Function Tests:  Recent Labs Lab 02/24/13 1500  AST 22  ALT 26  ALKPHOS 78  BILITOT 0.4  PROT 6.0  ALBUMIN 2.8*   CBC:  Recent Labs Lab 02/24/13 1500 02/25/13 0428  WBC 6.7 6.3  NEUTROABS 4.4  --   HGB 10.9* 10.9*  HCT 32.8* 33.4*  MCV 97.6 97.9  PLT 154 157   BNP: BNP (last 3 results)  Recent Labs  05/31/12 1600  PROBNP 118.8   CBG:  Recent Labs Lab 02/24/13 2234 02/25/13 0754 02/25/13 1146  GLUCAP 117* 84 118*    Signed:  Arloa Prak  [facility] 02/25/2013, 4:46 PM

## 2013-02-25 NOTE — Consult Note (Signed)
Referring Physician: [place]    Chief Complaint: right arm decreased sensation  HPI:                                                                                                                                         [name] is an 62 y.o. female with history of multiple myeloma and chronic right arm and leg decreased sensation presented to the ED after noting right arm weakness on 02/24/13. patient went to sleep normal and woke up at 3 AM noting she could not hold a cup in her right hand and her right arm felt heavy.  Patient was brought to the ED. Currently she is still feeling her right hand has decreased strength and decreased sensation. She did state she has been on ASA 81 mg daily since June and not missed a dose. MRI does demonstrate small focus of restricted diffusion in the left posterior frontal cortex over the convexity consistent with acute infarct.   A1c: 6.4 LDL: unable to calculate due to TG > 400 (605) 2 D echo: finished and pending reading Carotid Doppler --pending  Date last known well: Date: 02/24/2013 Time last known well: Unable to determine tPA Given: No: presented out of window  Past Medical History  Diagnosis Date  . Thyroid disease Hypothyroidism  . Hypercholesterolemia   . Compression fracture 04/08/2007    pathologic compression fracture  . Hypothyroidism   . FHx: chemotherapy     s/p 5 cycle revlimid/low dose decadron,s/p velcade,doxil,decadron,  . Hx of radiation therapy 05/05/07-05/18/07,& 03/05/11-03/21/11-    l3&l5 in 2008, t2-t6 03/2011  . GERD (gastroesophageal reflux disease)   . Insomnia     associated with steroids  . Nausea   . Constipation     takes oxycontin,vicodin  . Hx of radiation therapy 05/05/2007 to 05/18/2007    palliative, L3-5  . 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 Hx of radiation therapy 03/05/2011 to 03/21/2011    palliative T2-T6, c-spine  . 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 PONV  (postoperative nausea and vomiting)     Past Surgical History  Procedure Laterality Date  . Cholecystectomy    . Knee surgery    . Knee surgery    . Video bronchoscopy  07/30/2011    Procedure: VIDEO BRONCHOSCOPY WITHOUT FLUORO;  Surgeon: Barbaraann Share, MD;  Location: [facility];  Service: Cardiopulmonary;  Laterality: Bilateral;    Family History  Problem Relation Age of Onset  . Cancer Brother     lung ca  . Cancer Maternal Uncle     colon  . Cancer Maternal Grandmother     breast ca   Social History:  reports that she quit smoking about 34 years ago. She has never used smokeless tobacco. She reports that she does not drink alcohol or use illicit drugs.  Allergies: No Known Allergies  Medications:                                                                                                                           Prior to Admission:  Prescriptions prior to admission  Medication Sig Dispense Refill  . acyclovir (ZOVIRAX) 800 MG tablet Take 800 mg by mouth 2 (two) times daily.       Marland Kitchen ALPRAZolam (XANAX) 0.5 MG tablet Take 0.5 mg by mouth 3 (three) times daily as needed for anxiety.       Marland Kitchen dexamethasone (DECADRON) 4 MG tablet Take 40 mg by mouth once a week. Taken on Thursdays.      Marland Kitchen dicyclomine (BENTYL) 20 MG tablet Take 20 mg by mouth 2 (two) times daily.       Marland Kitchen ESZOPICLONE 3 MG tablet Take 3 mg by mouth at bedtime.       . furosemide (LASIX) 40 MG tablet Take 40 mg by mouth 2 (two) times daily.      Marland Kitchen gabapentin (NEURONTIN) 300 MG capsule Take 600 mg by mouth 4 (four) times daily.       Marland Kitchen levothyroxine (SYNTHROID, LEVOTHROID) 100 MCG tablet Take 100 mcg by mouth daily.       Marland Kitchen lidocaine-prilocaine (EMLA) cream Apply 1 application topically as needed (for port-a-cath access).      Marland Kitchen loperamide (IMODIUM) 2 MG capsule Take 2 mg by mouth 4 (four) times daily as needed for diarrhea or loose stools.      Marland Kitchen morphine (MS CONTIN) 15 MG 12 hr tablet Take 1 tablet (15 mg  total) by mouth 2 (two) times daily.  60 tablet  0  . Multiple Vitamin (MULITIVITAMIN WITH MINERALS) TABS Take 1 tablet by mouth daily.      . ondansetron (ZOFRAN-ODT) 4 MG disintegrating tablet Take 1 tablet by mouth every 8 (eight) hours as needed for nausea.       . pantoprazole (PROTONIX) 40 MG tablet Take 40 mg by mouth daily.      . potassium chloride (K-DUR,KLOR-CON) 10 MEQ tablet Take 10 mEq by mouth 2 (two) times daily.      Marland Kitchen PRESCRIPTION MEDICATION Inject into the vein every 7 (seven) days. Velcade infusion every Thursday.       Scheduled: . acyclovir  800 mg Oral BID  . clopidogrel  75 mg Oral Q breakfast  . dicyclomine  20 mg Oral BID  . enoxaparin (LOVENOX) injection  40 mg Subcutaneous Q24H  . furosemide  40 mg Oral BID  . gabapentin  600 mg Oral QID  . levothyroxine  100 mcg Oral QAC breakfast  . morphine  15 mg Oral BID  . multivitamin with minerals  1 tablet Oral Daily  . pantoprazole  40 mg Oral Daily  . potassium chloride  10 mEq Oral BID  . simvastatin  20 mg Oral q1800  . sodium chloride  3 mL Intravenous Q12H  ROS:                                                                                                                                       History obtained from the patient  General ROS: negative for - chills, fatigue, fever, night sweats, weight gain or weight loss Psychological ROS: negative for - behavioral disorder, hallucinations, memory difficulties, mood swings or suicidal ideation Ophthalmic ROS: negative for - blurry vision, double vision, eye pain or loss of vision ENT ROS: negative for - epistaxis, nasal discharge, oral lesions, sore throat, tinnitus or vertigo Allergy and Immunology ROS: negative for - hives or itchy/watery eyes Hematological and Lymphatic ROS: negative for - bleeding problems, bruising or swollen lymph nodes Endocrine ROS: negative for - galactorrhea, hair pattern changes, polydipsia/polyuria or temperature  intolerance Respiratory ROS: negative for - cough, hemoptysis, shortness of breath or wheezing Cardiovascular ROS: negative for - chest pain, dyspnea on exertion, edema or irregular heartbeat Gastrointestinal ROS: negative for - abdominal pain, diarrhea, hematemesis, nausea/vomiting or stool incontinence Genito-Urinary ROS: negative for - dysuria, hematuria, incontinence or urinary frequency/urgency Musculoskeletal ROS: negative for - joint swelling or muscular weakness Neurological ROS: as noted in HPI Dermatological ROS: negative for rash and skin lesion changes  Neurologic Examination:                                                                                                      Blood pressure 113/67, pulse 82, temperature 98.1 F (36.7 C), temperature source Oral, resp. rate 17, height 5\' 6"  (1.676 m), weight 89.4 kg (197 lb 1.5 oz), SpO2 100.00%.  Mental Status: Alert, oriented, thought content appropriate.  Speech fluent without evidence of aphasia.  Able to follow 3 step commands without difficulty. Cranial Nerves: II: Discs flat bilaterally; Visual fields grossly normal, pupils equal, round, reactive to light and accommodation III,IV, VI: ptosis not present, extra-ocular motions intact bilaterally V,VII: smile symmetric, facial light touch sensation normal bilaterally VIII: hearing normal bilaterally IX,X: gag reflex present XI: bilateral shoulder shrug XII: midline tongue extension Motor: Right : Upper extremity   4/5(proximal)  Left:     Upper extremity   5/5  Lower extremity   5/5     Lower extremity   5/5 --right UE and LE drift Tone and bulk:normal tone throughout; no atrophy noted Sensory: decreased compared to the left in right hand and right calf to foot.  Deep Tendon Reflexes:  Right: Upper Extremity   Left: Upper extremity  biceps (C-5 to C-6) 2/4   biceps (C-5 to C-6) 2/4 tricep (C7) 2/4    triceps (C7) 2/4 Brachioradialis (C6) 2/4  Brachioradialis (C6)  2/4  Lower Extremity Lower Extremity  quadriceps (L-2 to L-4) 1/4   quadriceps (L-2 to L-4) 1/4 Achilles (S1) 0/4   Achilles (S1) 0/4  Plantars: Right: downgoing   Left: downgoing Cerebellar: normal finger-to-nose,  normal heel-to-shin test Gait: normal CV: pulses palpable throughout    Results for orders placed during the hospital encounter of 02/24/13 (from the past 48 hour(s))  CBC WITH DIFFERENTIAL     Status: Abnormal   Collection Time    02/24/13  3:00 PM      Result Value Range   WBC 6.7  4.0 - 10.5 K/uL   RBC 3.36 (*) 3.87 - 5.11 MIL/uL   Hemoglobin 10.9 (*) 12.0 - 15.0 g/dL   HCT 04.5 (*) 40.9 - 81.1 %   MCV 97.6  78.0 - 100.0 fL   MCH 32.4  26.0 - 34.0 pg   MCHC 33.2  30.0 - 36.0 g/dL   RDW 91.4 (*) 78.2 - 95.6 %   Platelets 154  150 - 400 K/uL   Neutrophils Relative % 65  43 - 77 %   Neutro Abs 4.4  1.7 - 7.7 K/uL   Lymphocytes Relative 24  12 - 46 %   Lymphs Abs 1.6  0.7 - 4.0 K/uL   Monocytes Relative 11  3 - 12 %   Monocytes Absolute 0.7  0.1 - 1.0 K/uL   Eosinophils Relative 0  0 - 5 %   Eosinophils Absolute 0.0  0.0 - 0.7 K/uL   Basophils Relative 0  0 - 1 %   Basophils Absolute 0.0  0.0 - 0.1 K/uL  COMPREHENSIVE METABOLIC PANEL     Status: Abnormal   Collection Time    02/24/13  3:00 PM      Result Value Range   Sodium 139  135 - 145 mEq/L   Potassium 3.5  3.5 - 5.1 mEq/L   Chloride 104  96 - 112 mEq/L   CO2 27  19 - 32 mEq/L   Glucose, Bld 87  70 - 99 mg/dL   BUN 15  6 - 23 mg/dL   Creatinine, Ser 2.13  0.50 - 1.10 mg/dL   Calcium 8.7  8.4 - 08.6 mg/dL   Total Protein 6.0  6.0 - 8.3 g/dL   Albumin 2.8 (*) 3.5 - 5.2 g/dL   AST 22  0 - 37 U/L   ALT 26  0 - 35 U/L   Alkaline Phosphatase 78  39 - 117 U/L   Total Bilirubin 0.4  0.3 - 1.2 mg/dL   GFR calc non Af Amer 62 (*) >90 mL/min   GFR calc Af Amer 72 (*) >90 mL/min   Comment: (NOTE)     The eGFR has been calculated using the CKD EPI equation.     This calculation has not been validated in  all clinical situations.     eGFR's persistently <90 mL/min signify possible Chronic Kidney     Disease.  HEMOGLOBIN A1C     Status: Abnormal   Collection Time    02/24/13  3:00 PM      Result Value Range   Hemoglobin A1C 6.4 (*) <5.7 %   Comment: (NOTE)  According to the ADA Clinical Practice Recommendations for 2011, when     HbA1c is used as a screening test:      >=6.5%   Diagnostic of Diabetes Mellitus               (if abnormal result is confirmed)     5.7-6.4%   Increased risk of developing Diabetes Mellitus     References:Diagnosis and Classification of Diabetes Mellitus,Diabetes     Care,2011,34(Suppl 1):S62-S69 and Standards of Medical Care in             Diabetes - 2011,Diabetes Care,2011,34 (Suppl 1):S11-S61.   Mean Plasma Glucose 137 (*) <117 mg/dL   Comment: Performed at [facility]  TSH     Status: Abnormal   Collection Time    02/24/13  3:00 PM      Result Value Range   TSH 5.185 (*) 0.350 - 4.500 uIU/mL   Comment: Performed at [facility]  VITAMIN B12     Status: Abnormal   Collection Time    02/24/13  3:00 PM      Result Value Range   Vitamin B-12 1786 (*) 211 - 911 pg/mL   Comment: Performed at [facility]  GLUCOSE, CAPILLARY     Status: Abnormal   Collection Time    02/24/13 10:34 PM      Result Value Range   Glucose-Capillary 117 (*) 70 - 99 mg/dL  URINE RAPID DRUG SCREEN (HOSP PERFORMED)     Status: Abnormal   Collection Time    02/25/13  1:57 AM      Result Value Range   Opiates POSITIVE (*) NONE DETECTED   Cocaine NONE DETECTED  NONE DETECTED   Benzodiazepines NONE DETECTED  NONE DETECTED   Amphetamines NONE DETECTED  NONE DETECTED   Tetrahydrocannabinol NONE DETECTED  NONE DETECTED   Barbiturates NONE DETECTED  NONE DETECTED   Comment:            DRUG SCREEN FOR MEDICAL PURPOSES     ONLY.  IF CONFIRMATION IS NEEDED     FOR ANY PURPOSE,  NOTIFY LAB     WITHIN 5 DAYS.                LOWEST DETECTABLE LIMITS     FOR URINE DRUG SCREEN     Drug Class       Cutoff (ng/mL)     Amphetamine      1000     Barbiturate      200     Benzodiazepine   200     Tricyclics       300     Opiates          300     Cocaine          300     THC              50  CBC     Status: Abnormal   Collection Time    02/25/13  4:28 AM      Result Value Range   WBC 6.3  4.0 - 10.5 K/uL   RBC 3.41 (*) 3.87 - 5.11 MIL/uL   Hemoglobin 10.9 (*) 12.0 - 15.0 g/dL   HCT 46.9 (*) 62.9 - 52.8 %   MCV 97.9  78.0 - 100.0 fL   MCH 32.0  26.0 - 34.0 pg   MCHC 32.6  30.0 - 36.0 g/dL   RDW 41.3 (*) 24.4 -  15.5 %   Platelets 157  150 - 400 K/uL  BASIC METABOLIC PANEL     Status: Abnormal   Collection Time    02/25/13  4:28 AM      Result Value Range   Sodium 138  135 - 145 mEq/L   Potassium 3.5  3.5 - 5.1 mEq/L   Chloride 102  96 - 112 mEq/L   CO2 27  19 - 32 mEq/L   Glucose, Bld 115 (*) 70 - 99 mg/dL   BUN 15  6 - 23 mg/dL   Creatinine, Ser 1.61  0.50 - 1.10 mg/dL   Calcium 8.9  8.4 - 09.6 mg/dL   GFR calc non Af Amer 78 (*) >90 mL/min   GFR calc Af Amer >90  >90 mL/min   Comment: (NOTE)     The eGFR has been calculated using the CKD EPI equation.     This calculation has not been validated in all clinical situations.     eGFR's persistently <90 mL/min signify possible Chronic Kidney     Disease.  LIPID PANEL     Status: Abnormal   Collection Time    02/25/13  4:28 AM      Result Value Range   Cholesterol 283 (*) 0 - 200 mg/dL   Triglycerides 045 (*) <150 mg/dL   HDL 59  >40 mg/dL   Total CHOL/HDL Ratio 4.8     VLDL UNABLE TO CALCULATE IF TRIGLYCERIDE OVER 400 mg/dL  0 - 40 mg/dL   LDL Cholesterol UNABLE TO CALCULATE IF TRIGLYCERIDE OVER 400 mg/dL  0 - 99 mg/dL   Comment:            Total Cholesterol/HDL:CHD Risk     Coronary Heart Disease Risk Table                         Men   Women      1/2 Average Risk   3.4   3.3      Average Risk        5.0   4.4      2 X Average Risk   9.6   7.1      3 X Average Risk  23.4   11.0                Use the calculated Patient Ratio     above and the CHD Risk Table     to determine the patient's CHD Risk.                ATP III CLASSIFICATION (LDL):      <100     mg/dL   Optimal      981-191  mg/dL   Near or Above                        Optimal      130-159  mg/dL   Borderline      478-295  mg/dL   High      >621     mg/dL   Very High     Performed at [facility]  GLUCOSE, CAPILLARY     Status: None   Collection Time    02/25/13  7:54 AM      Result Value Range   Glucose-Capillary 84  70 - 99 mg/dL   Ct Head (brain) Wo Contrast  02/24/2013   *RADIOLOGY REPORT*  Clinical Data:  Right-sided weakness. Multiple myeloma.  CT HEAD WITHOUT CONTRAST  Technique:  Contiguous axial images were obtained from the base of the skull through the vertex without contrast.  Comparison: 07/20/2011.  Findings: No evidence of an acute infarct, acute hemorrhage, mass lesion, mass effect or hydrocephalus.  There are lytic lesions in the calvarium with the largest seen in the right parietal bone, measuring approximately 0.9 x 3.7 cm (previously 0.9 x 1.7 cm).  Other lesions are stable or minimally larger.  Visualized portions of the paranasal sinuses and mastoid air cells are clear.  IMPRESSION:  1.  No acute intracranial abnormality. 2.  Lytic lesions in the calvarium, some of which are larger than on 07/19/2011, in this patient with a history of multiple myeloma.   Original Report Authenticated By: Leanna Battles, M.D.   Mr [facility] Wo Contrast  02/25/2013   *RADIOLOGY REPORT*  Clinical Data:  Right-sided weakness.  Multiple myeloma.  MRI HEAD WITHOUT AND WITH CONTRAST MRA HEAD WITHOUT CONTRAST  Technique:  Multiplanar, multiecho pulse sequences of the brain and surrounding structures were obtained without and with intravenous contrast.  Angiographic images of the head were obtained using MRA technique  without contrast.  Contrast: 18mL MULTIHANCE GADOBENATE DIMEGLUMINE 529 MG/ML IV SOLN  Comparison:  CT head 02/24/2013  MRI HEAD WITHOUT AND WITH CONTRAST  Findings:  Small focus of restricted diffusion in the left posterior frontal cortex over the convexity consistent with acute infarct.  No other acute infarct.  Patchy small white matter hyperintensities bilaterally are nonspecific and may be related to chronic microvascular ischemic change or therapy related change.  Brainstem and cerebellum are normal.  Negative for hemorrhage.  Large myelomatous lesion of the right parietal bone which is expansile.  There is dural thickening enhancement which may be related to tumor infiltration of the dura.  This lesion measures 10 x 40 mm.  There is another 1 cm lesion in the left parietal bone related to myeloma.  1 cm enhancing lesion present in the superior occipital bone consistent with myeloma.  IMPRESSION: Sub centimeter acute infarct left posterior frontal cortex over the convexity.  Calvarial lesions of myeloma.  Large right parietal lesion with a probable dural invasion.  MRA HEAD  Findings: Both vertebral arteries are widely patent to the basilar. PICA is patent bilaterally.  The basilar is widely patent.  The superior cerebellar and posterior cerebral arteries are patent bilaterally without stenosis.  The internal carotid artery is patent bilaterally.  Anterior and middle cerebral arteries are widely patent without stenosis. Negative for aneurysm.  IMPRESSION:  Negative   Original Report Authenticated By: Janeece Riggers, M.D.   Mr [facility] ZO Contrast  02/25/2013   *RADIOLOGY REPORT*  Clinical Data:  Right-sided weakness.  Multiple myeloma.  MRI HEAD WITHOUT AND WITH CONTRAST MRA HEAD WITHOUT CONTRAST  Technique:  Multiplanar, multiecho pulse sequences of the brain and surrounding structures were obtained without and with intravenous contrast.  Angiographic images of the head were obtained using MRA technique  without contrast.  Contrast: 18mL MULTIHANCE GADOBENATE DIMEGLUMINE 529 MG/ML IV SOLN  Comparison:  CT head 02/24/2013  MRI HEAD WITHOUT AND WITH CONTRAST  Findings:  Small focus of restricted diffusion in the left posterior frontal cortex over the convexity consistent with acute infarct.  No other acute infarct.  Patchy small white matter hyperintensities bilaterally are nonspecific and may be related to chronic microvascular ischemic change or therapy related change.  Brainstem and cerebellum are normal.  Negative for hemorrhage.  Large myelomatous lesion of the right parietal bone which is expansile.  There is dural thickening enhancement which may be related to tumor infiltration of the dura.  This lesion measures 10 x 40 mm.  There is another 1 cm lesion in the left parietal bone related to myeloma.  1 cm enhancing lesion present in the superior occipital bone consistent with myeloma.  IMPRESSION: Sub centimeter acute infarct left posterior frontal cortex over the convexity.  Calvarial lesions of myeloma.  Large right parietal lesion with a probable dural invasion.  MRA HEAD  Findings: Both vertebral arteries are widely patent to the basilar. PICA is patent bilaterally.  The basilar is widely patent.  The superior cerebellar and posterior cerebral arteries are patent bilaterally without stenosis.  The internal carotid artery is patent bilaterally.  Anterior and middle cerebral arteries are widely patent without stenosis. Negative for aneurysm.  IMPRESSION:  Negative   Original Report Authenticated By: Janeece Riggers, M.D.   Mr Cervical Spine W Wo Contrast  02/25/2013   *RADIOLOGY REPORT*  Clinical Data: Multiple myeloma.  Right-sided weakness.  MRI CERVICAL SPINE WITHOUT AND WITH CONTRAST  Technique:  Multiplanar and multiecho pulse sequences of the cervical spine, to include the craniocervical junction and cervicothoracic junction, were obtained according to standard protocol without and with intravenous  contrast.  Contrast:  18 ml Multihance.  Comparison: CT 07/02/2012.MRI 02/27/2011.  Findings: Near anatomic alignment cervical spine.  2 mm retrolisthesis of C5 on C6 and 1 mm of retrolisthesis of C6 on C7. Spondylolisthesis appears degenerative in associated with disc disease at both levels.  Cervical cord demonstrates no intramedullary lesion or cord edema.  There are lesions of multiple myeloma which demonstrate post- gadolinium enhancement scattered throughout the cervical spine. Most prominent of these is on the right side of the C2 vertebra, extending from the odontoid into the C2 body.  There is a larger lesion in the T2 vertebra eccentric to the left and partially visualized T3 lesion present as well.  There is no pathologic compression fracture.  The prevertebral soft tissues appear normal. Flow voids are present in both vertebral arteries.  There is infiltration of the C7 and C6 spinous processes consistent with myeloma. Disc desiccation is present at every level.  C2-C3:  Negative.  C3-C4:  Shallow broad-based disc osteophyte complex.  No significant central stenosis.  The right foramen is patent.  Left facet arthrosis and uncovertebral spurring produce left foraminal stenosis potentially affecting the left C4 nerve.  C4-C5:  Shallow disc osteophyte complex.  Central canal appears adequately patent.  Mild right foraminal encroachment associated with uncovertebral spurring.  Mild bilateral facet degeneration.  C5-C6:  Moderate central stenosis is present with left eccentric disc osteophyte complex and posterior ligamentum flavum redundancy. There is flattening of the left ventral cord.  The left foraminal stenosis is present secondary the lateral disc protrusion and uncovertebral spurring.  The right foramen appears patent.  C6-C7:  Moderate central stenosis.  Left eccentric disc osteophyte complex flattens the left ventral aspect of the cervical cord. There is left foraminal encroachment due to  uncovertebral spurring and lateral disc protrusion potentially affecting the left C7 nerve.  Posterior ligamentum flavum redundancy contributes to central stenosis.  C7-T1:  Disc desiccation without stenosis.  T1-T2:  Shallow broad-based disc bulge without significant stenosis.  IMPRESSION: 1.  Myeloma involvement of the cervical and thoracic spine detailed above.  No pathologic compression fracture or extraosseous extension of tumor. 2.  Moderate cervical spondylosis with moderate left eccentric  central stenosis at C5-C6 and C6-C7.  Left foraminal stenosis is also present.  By comparison, the right-sided stenosis is mild in this patient. 3.  When comparing today's exam to the most recent MRI 02/27/2011, there is improvement in the overall enhancement pattern, with less infiltration suggesting treated myeloma.  The enhancement in the bone marrow adjacent to the plasmacytomas is also less vigorous, again consistent with treatment.   Original Report Authenticated By: Andreas Newport, M.D.    Assessment and plan discussed with with attending physician and they are in agreement.    Felicie Morn [name] [facility] [phone]  02/25/2013, 9:52 AM   Assessment: 62 y.o. female acute left posterior frontal cortex.    Recommend:   1) stroke workup 2) Plavix 75 mg daily.   Stroke Risk Factors - hyperlipidemia  Patient discharged prior to my evaluation  Thana Farr, MD [facility] [phone]

## 2013-02-25 NOTE — Progress Notes (Signed)
CARE MANAGEMENT NOTE 02/25/2013  Patient:  [name], [name]   Account Number:  [id number]  Date Initiated:  02/25/2013  Documentation initiated by:  Ezekiel Ina  Subjective/Objective Assessment:   pt admitted Chief Complaint: right side weakness and worsening numbness     Action/Plan:   from home   Anticipated DC Date:  02/25/2013   Anticipated DC Plan:  HOME/SELF CARE      DC Planning Services  CM consult      Choice offered to / List presented to:             Status of service:  In process, will continue to follow Medicare Important Message given?   (If response is "NO", the following Medicare IM given date fields will be blank) Date Medicare IM given:   Date Additional Medicare IM given:    Discharge Disposition:    Per UR Regulation:  Reviewed for med. necessity/level of care/duration of stay  If discussed at Long Length of Stay Meetings, dates discussed:    Comments:  02/25/13 MMcGibboney, RN, BSN Chart reviewed. Will continue to follow for discharge needs.

## 2013-02-25 NOTE — Progress Notes (Signed)
*  PRELIMINARY RESULTS* Vascular Ultrasound Carotid Duplex (Doppler) has been completed.   Findings suggest 1-39% internal carotid artery stenosis bilaterally without evidence of significant plaque morphology. Vertebral arteries are patent with antegrade flow.  02/25/2013 10:16 AM Gertie Fey, RVT, RDCS, RDMS

## 2013-02-25 NOTE — Progress Notes (Signed)
  Echocardiogram 2D Echocardiogram has been performed.  [name] 02/25/2013, 9:01 AM

## 2013-02-25 NOTE — Progress Notes (Signed)
Pt discharged to home. DC instructions given using teachback with dtr at bedside. No concerns voiced. Prescription for glucose strips given. Educational materials on diabetes also given. Pt educated on blood sugar levels to call md for. Pt reminded to stop by pharmacy and pick up meds that were eprescribed. Acknowledged same. Left unit in wheelchair pushed by dtr to meet ride downstairs. Left in good condition. Port a cath dc'd by iv team. Ofilia Neas.

## 2013-02-26 ENCOUNTER — Other Ambulatory Visit: Payer: Self-pay | Admitting: Medical Oncology

## 2013-02-26 DIAGNOSIS — C9000 Multiple myeloma not having achieved remission: Secondary | ICD-10-CM

## 2013-02-26 MED ORDER — MORPHINE SULFATE ER 15 MG PO TBCR: 15.0000 mg | EXTENDED_RELEASE_TABLET | Freq: Two times a day (BID) | ORAL | Status: DC

## 2013-02-26 MED ORDER — HYDROCODONE-ACETAMINOPHEN 7.5-325 MG PO TABS: 1.0000 | ORAL_TABLET | Freq: Four times a day (QID) | ORAL | Status: DC | PRN

## 2013-02-26 NOTE — Telephone Encounter (Signed)
Refill locked in injection room. Pt notified to pick up rx.

## 2013-03-04 ENCOUNTER — Telehealth: Payer: Self-pay | Admitting: *Deleted

## 2013-03-04 ENCOUNTER — Ambulatory Visit (HOSPITAL_BASED_OUTPATIENT_CLINIC_OR_DEPARTMENT_OTHER): Payer: Medicare Other

## 2013-03-04 ENCOUNTER — Ambulatory Visit (HOSPITAL_BASED_OUTPATIENT_CLINIC_OR_DEPARTMENT_OTHER): Payer: Medicare Other | Admitting: Physician Assistant

## 2013-03-04 ENCOUNTER — Other Ambulatory Visit: Payer: Medicare Other | Admitting: Lab

## 2013-03-04 ENCOUNTER — Encounter: Payer: Self-pay | Admitting: Internal Medicine

## 2013-03-04 ENCOUNTER — Ambulatory Visit: Payer: Medicare Other

## 2013-03-04 ENCOUNTER — Telehealth: Payer: Self-pay | Admitting: Internal Medicine

## 2013-03-04 VITALS — BP 127/73 | HR 111 | Temp 97.4°F | Resp 19 | Ht 66.0 in | Wt 194.2 lb

## 2013-03-04 DIAGNOSIS — I6350 Cerebral infarction due to unspecified occlusion or stenosis of unspecified cerebral artery: Secondary | ICD-10-CM

## 2013-03-04 DIAGNOSIS — C9000 Multiple myeloma not having achieved remission: Secondary | ICD-10-CM

## 2013-03-04 DIAGNOSIS — Z5111 Encounter for antineoplastic chemotherapy: Secondary | ICD-10-CM

## 2013-03-04 DIAGNOSIS — R197 Diarrhea, unspecified: Secondary | ICD-10-CM

## 2013-03-04 DIAGNOSIS — R29810 Facial weakness: Secondary | ICD-10-CM

## 2013-03-04 LAB — CBC WITH DIFFERENTIAL/PLATELET
Basophils Absolute: 0 10*3/uL (ref 0.0–0.1)
Eosinophils Absolute: 0 10*3/uL (ref 0.0–0.5)
HCT: 34 % — ABNORMAL LOW (ref 34.8–46.6)
LYMPH%: 10.1 % — ABNORMAL LOW (ref 14.0–49.7)
MCV: 96.7 fL (ref 79.5–101.0)
MONO#: 0.4 10*3/uL (ref 0.1–0.9)
MONO%: 5 % (ref 0.0–14.0)
NEUT#: 7.1 10*3/uL — ABNORMAL HIGH (ref 1.5–6.5)
NEUT%: 84.3 % — ABNORMAL HIGH (ref 38.4–76.8)
Platelets: 199 10*3/uL (ref 145–400)
WBC: 8.5 10*3/uL (ref 3.9–10.3)

## 2013-03-04 LAB — COMPREHENSIVE METABOLIC PANEL (CC13)
BUN: 17.8 mg/dL (ref 7.0–26.0)
CO2: 24 mEq/L (ref 22–29)
Creatinine: 0.9 mg/dL (ref 0.6–1.1)
Glucose: 116 mg/dl (ref 70–140)
Total Bilirubin: 0.97 mg/dL (ref 0.20–1.20)
Total Protein: 6.9 g/dL (ref 6.4–8.3)

## 2013-03-04 MED ORDER — ONDANSETRON HCL 8 MG PO TABS
8.0000 mg | ORAL_TABLET | Freq: Once | ORAL | Status: AC
  Administered 2013-03-04: 8 mg via ORAL

## 2013-03-04 MED ORDER — SODIUM CHLORIDE 0.9 % IJ SOLN
10.0000 mL | INTRAMUSCULAR | Status: DC | PRN
  Administered 2013-03-04: 10 mL via INTRAVENOUS
  Filled 2013-03-04: qty 10

## 2013-03-04 MED ORDER — ZOLEDRONIC ACID 4 MG/100ML IV SOLN
4.0000 mg | Freq: Once | INTRAVENOUS | Status: AC
  Administered 2013-03-04: 4 mg via INTRAVENOUS
  Filled 2013-03-04: qty 100

## 2013-03-04 MED ORDER — HEPARIN SOD (PORK) LOCK FLUSH 100 UNIT/ML IV SOLN
250.0000 [IU] | Freq: Once | INTRAVENOUS | Status: DC | PRN
  Filled 2013-03-04: qty 5

## 2013-03-04 MED ORDER — HEPARIN SOD (PORK) LOCK FLUSH 100 UNIT/ML IV SOLN
500.0000 [IU] | Freq: Once | INTRAVENOUS | Status: AC
  Administered 2013-03-04: 500 [IU] via INTRAVENOUS
  Filled 2013-03-04: qty 5

## 2013-03-04 MED ORDER — SODIUM CHLORIDE 0.9 % IV SOLN
Freq: Once | INTRAVENOUS | Status: AC
  Administered 2013-03-04: 15:00:00 via INTRAVENOUS

## 2013-03-04 MED ORDER — BORTEZOMIB CHEMO SQ INJECTION 3.5 MG (2.5MG/ML)
1.3000 mg/m2 | Freq: Once | INTRAMUSCULAR | Status: AC
  Administered 2013-03-04: 2.5 mg via SUBCUTANEOUS
  Filled 2013-03-04: qty 2.5

## 2013-03-04 NOTE — Patient Instructions (Addendum)
[facility] Discharge Instructions for Patients Receiving Chemotherapy  Today you received the following chemotherapy agents Velcade and Zometa  To help prevent nausea and vomiting after your treatment, we encourage you to take your nausea medication as prescribed   If you develop nausea and vomiting that is not controlled by your nausea medication, call the clinic.   BELOW ARE SYMPTOMS THAT SHOULD BE REPORTED IMMEDIATELY:  *FEVER GREATER THAN 100.5 F  *CHILLS WITH OR WITHOUT FEVER  NAUSEA AND VOMITING THAT IS NOT CONTROLLED WITH YOUR NAUSEA MEDICATION  *UNUSUAL SHORTNESS OF BREATH  *UNUSUAL BRUISING OR BLEEDING  TENDERNESS IN MOUTH AND THROAT WITH OR WITHOUT PRESENCE OF ULCERS  *URINARY PROBLEMS  *BOWEL PROBLEMS  UNUSUAL RASH Items with * indicate a potential emergency and should be followed up as soon as possible.  Feel free to call the clinic you have any questions or concerns. The clinic phone number is [phone].   Zoledronic Acid injection (Hypercalcemia, Oncology) What is this medicine? ZOLEDRONIC ACID (ZOE le dron ik AS id) lowers the amount of calcium loss from bone. It is used to treat too much calcium in your blood from cancer. It is also used to prevent complications of cancer that has spread to the bone. This medicine may be used for other purposes; ask your health care provider or pharmacist if you have questions. What should I tell my health care provider before I take this medicine? They need to know if you have any of these conditions: -aspirin-sensitive asthma -dental disease -kidney disease -an unusual or allergic reaction to zoledronic acid, other medicines, foods, dyes, or preservatives -pregnant or trying to get pregnant -breast-feeding How should I use this medicine? This medicine is for infusion into a vein. It is given by a health care professional in a hospital or clinic setting. Talk to your pediatrician regarding the use of  this medicine in children. Special care may be needed. Overdosage: If you think you have taken too much of this medicine contact a poison control center or emergency room at once. NOTE: This medicine is only for you. Do not share this medicine with others. What if I miss a dose? It is important not to miss your dose. Call your doctor or health care professional if you are unable to keep an appointment. What may interact with this medicine? -certain antibiotics given by injection -NSAIDs, medicines for pain and inflammation, like ibuprofen or naproxen -some diuretics like bumetanide, furosemide -teriparatide -thalidomide This list may not describe all possible interactions. Give your health care provider a list of all the medicines, herbs, non-prescription drugs, or dietary supplements you use. Also tell them if you smoke, drink alcohol, or use illegal drugs. Some items may interact with your medicine. What should I watch for while using this medicine? Visit your doctor or health care professional for regular checkups. It may be some time before you see the benefit from this medicine. Do not stop taking your medicine unless your doctor tells you to. Your doctor may order blood tests or other tests to see how you are doing. Women should inform their doctor if they wish to become pregnant or think they might be pregnant. There is a potential for serious side effects to an unborn child. Talk to your health care professional or pharmacist for more information. You should make sure that you get enough calcium and vitamin D while you are taking this medicine. Discuss the foods you eat and the vitamins you take with your  health care professional. Some people who take this medicine have severe bone, joint, and/or muscle pain. This medicine may also increase your risk for a broken thigh bone. Tell your doctor right away if you have pain in your upper leg or groin. Tell your doctor if you have any pain that  does not go away or that gets worse. What side effects may I notice from receiving this medicine? Side effects that you should report to your doctor or health care professional as soon as possible: -allergic reactions like skin rash, itching or hives, swelling of the face, lips, or tongue -anxiety, confusion, or depression -breathing problems -changes in vision -feeling faint or lightheaded, falls -jaw burning, cramping, pain -muscle cramps, stiffness, or weakness -trouble passing urine or change in the amount of urine Side effects that usually do not require medical attention (report to your doctor or health care professional if they continue or are bothersome): -bone, joint, or muscle pain -fever -hair loss -irritation at site where injected -loss of appetite -nausea, vomiting -stomach upset -tired This list may not describe all possible side effects. Call your doctor for medical advice about side effects. You may report side effects to FDA at 1-800-FDA-1088. Where should I keep my medicine? This drug is given in a hospital or clinic and will not be stored at home. NOTE: This sheet is a summary. It may not cover all possible information. If you have questions about this medicine, talk to your doctor, pharmacist, or health care provider.  2013, [employer]. (12/14/2010 9:06:58 AM)

## 2013-03-04 NOTE — Telephone Encounter (Signed)
appts made and printed. Pt is aware that her tx's will be added. i emailed MW to add the tx's....td

## 2013-03-04 NOTE — Telephone Encounter (Signed)
Per staff message and POF I have scheduled appts.  JMW  

## 2013-03-04 NOTE — Telephone Encounter (Signed)
pt MD office called to r/s appts...emailed MW to move tx to 9.5.14

## 2013-03-04 NOTE — ED Provider Notes (Signed)
I saw and evaluated the patient, reviewed the resident's note and I agree with the findings and plan.  62yF with r sided weakness. Admit for further eval.   Raeford Razor, MD 03/04/13 [phone]

## 2013-03-04 NOTE — Progress Notes (Addendum)
[facility] Telephone:([phone]) [phone]   Fax:([phone]) [phone]  SHARED VISIT PROGRESS NOTE  [name], R, [name] [address], [place]. [address] [place] [address]  DIAGNOSIS: Recurrent multiple myeloma initially diagnosed in October 2008.   PRIOR THERAPY:  1. Status post palliative radiotherapy to the lumbar spine between L3 and L5. The patient received a total dose of 3000 cGy in 10 fractions under the care of Dr. Mitzi Hansen between May 05, 2007 through May 18, 2007. 2. Status post 5 cycles of systemic chemotherapy with Revlimid and low-dose Decadron with good response to this treatment. 3. Status post autologous peripheral blood stem cell transplant at [facility] on Nov 20, 2007 under the care of Dr. Vicente Serene. 4. Status post treatment for disease recurrence with Velcade, Doxil and Decadron. Last dose given Nov 09, 2009. Discontinued secondary to intolerance but the patient had a good response to treatment at that time. 5. Status post palliative radiotherapy to the T2-T6 thoracic vertebrae completed 03/21/2011 under the care of Dr. Mitzi Hansen. 6. Systemic chemotherapy with Velcade 1.3 mg per meter squared given on days 1, 4, 8 and 11, and Doxil at 30 mg per meter squared given on day 4 in addition to Decadron status post 4 cycles, discontinued secondary to intolerance.   CURRENT THERAPY: 1.  systemic therapy with Velcade 1.3 mg/M2 subcutaneously in addition to Decadron 40 mg by mouth on a weekly basis, status post 14 cycles.  2. Zometa 4 mg IV given every 3 months  INTERVAL HISTORY: [name] 62 y.o. female returns to the clinic today for followup visit accompanied by her son.  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 During that admission she was also diagnosed with diabetes mellitus and started on metformin at 250 mg twice daily. She's also noted to have hyperlipidemia as well as mild ICA stenosis and  placed on Lipitor at 40 mg by mouth daily and Plavix 75 mg by mouth daily.. She is currently on Neurontin 600 mg by mouth every 6 hours. The patient is tolerating her treatment with Velcade fairly well. She denied having any significant nausea or vomiting. She denied having any fever or chills. She reports loose watery stools, 2-3 episodes per day. But she also reports some "accidents" that occur at night. She denies noting any blood in this watery stool.  She will continues on acyclovir 400 mg twice a day as prophylaxis.  MEDICAL HISTORY: Past Medical History  Diagnosis Date  . Thyroid disease Hypothyroidism  . Hypercholesterolemia   . Compression fracture 04/08/2007    pathologic compression fracture  . Hypothyroidism   . FHx: chemotherapy     s/p 5 cycle revlimid/low dose decadron,s/p velcade,doxil,decadron,  . Hx of radiation therapy 05/05/07-05/18/07,& 03/05/11-03/21/11-    l3&l5 in 2008, t2-t6 03/2011  . GERD (gastroesophageal reflux disease)   . Insomnia     associated with steroids  . Nausea   . Constipation     takes oxycontin,vicodin  . Hx of radiation therapy 05/05/2007 to 05/18/2007    palliative, L3-5  . 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 Hx of radiation therapy 03/05/2011 to 03/21/2011    palliative T2-T6, c-spine  . 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 PONV (postoperative nausea and vomiting)     ALLERGIES:  has No Known Allergies.  MEDICATIONS:  Current Outpatient Prescriptions  Medication  Sig Dispense Refill  . acyclovir (ZOVIRAX) 800 MG tablet Take 800 mg by mouth 2 (two) times daily.       Marland Kitchen ALPRAZolam (XANAX) 0.5 MG tablet Take 0.5 mg by mouth 3 (three) times daily as needed for anxiety.       Marland Kitchen atorvastatin (LIPITOR) 40 MG tablet Take 1 tablet (40 mg total) by mouth daily.  30 tablet  1  . clopidogrel (PLAVIX) 75 MG tablet Take 1 tablet (75 mg total) by mouth daily with breakfast.  30 tablet  1  . dexamethasone (DECADRON) 4  MG tablet Take 40 mg by mouth once a week. Taken on Thursdays.      Marland Kitchen dicyclomine (BENTYL) 20 MG tablet Take 20 mg by mouth 2 (two) times daily.       Marland Kitchen ESZOPICLONE 3 MG tablet Take 3 mg by mouth at bedtime.       . furosemide (LASIX) 40 MG tablet Take 40 mg by mouth 2 (two) times daily.      Marland Kitchen gabapentin (NEURONTIN) 300 MG capsule Take 600 mg by mouth 4 (four) times daily.       Marland Kitchen glucose blood test strip Use as instructed  100 each  12  . HYDROcodone-acetaminophen (NORCO) 7.5-325 MG per tablet Take 1 tablet by mouth every 6 (six) hours as needed for pain.  30 tablet  0  . levothyroxine (SYNTHROID, LEVOTHROID) 100 MCG tablet Take 100 mcg by mouth daily.       Marland Kitchen lidocaine-prilocaine (EMLA) cream Apply 1 application topically as needed (for port-a-cath access).      Marland Kitchen loperamide (IMODIUM) 2 MG capsule Take 2 mg by mouth 4 (four) times daily as needed for diarrhea or loose stools.      . metFORMIN (GLUCOPHAGE) 500 MG tablet Take 0.5 tablets (250 mg total) by mouth 2 (two) times daily with a meal.  60 tablet  1  . morphine (MS CONTIN) 15 MG 12 hr tablet Take 1 tablet (15 mg total) by mouth 2 (two) times daily.  60 tablet  0  . Multiple Vitamin (MULITIVITAMIN WITH MINERALS) TABS Take 1 tablet by mouth daily.      . ondansetron (ZOFRAN-ODT) 4 MG disintegrating tablet Take 1 tablet by mouth every 8 (eight) hours as needed for nausea.       . pantoprazole (PROTONIX) 40 MG tablet Take 40 mg by mouth daily.      . potassium chloride (K-DUR,KLOR-CON) 10 MEQ tablet Take 10 mEq by mouth 2 (two) times daily.      Marland Kitchen PRESCRIPTION MEDICATION Inject into the vein every 7 (seven) days. Velcade infusion every Thursday.       No current facility-administered medications for this visit.   Facility-Administered Medications Ordered in Other Visits  Medication Dose Route Frequency Provider Last Rate Last Dose  . 0.9 %  sodium chloride infusion   Intravenous Once Si Gaul, MD      . ondansetron (ZOFRAN) IVPB 8  mg  8 mg Intravenous Once Si Gaul, MD      . sodium chloride 0.9 % injection 10 mL  10 mL Intracatheter PRN Si Gaul, MD        SURGICAL HISTORY:  Past Surgical History  Procedure Laterality Date  . Cholecystectomy    . Knee surgery    . Knee surgery    . Video bronchoscopy  07/30/2011    Procedure: VIDEO BRONCHOSCOPY WITHOUT FLUORO;  Surgeon: Barbaraann Share, MD;  Location: [facility];  Service: Cardiopulmonary;  Laterality: Bilateral;    REVIEW OF SYSTEMS:  A comprehensive review of systems was negative except for: Constitutional: positive for fatigue Gastrointestinal: positive for diarrhea Musculoskeletal: positive for muscle weakness and Right-sided muscle weakness is improving Neurological: positive for paresthesia   PHYSICAL EXAMINATION: General appearance: alert, cooperative and no distress Head: Normocephalic, without obvious abnormality, atraumatic Neck: no adenopathy Lymph nodes: Cervical, supraclavicular, and axillary nodes normal. Resp: clear to auscultation bilaterally Cardio: regular rate and rhythm, S1, S2 normal, no murmur, click, rub or gallop GI: soft, non-tender; bowel sounds normal; no masses,  no organomegaly Extremities: extremities normal, atraumatic, no cyanosis or edema Neurologic: Alert and oriented X 3, normal strength and tone. Normal symmetric reflexes. Normal coordination and gait  ECOG PERFORMANCE STATUS: 1 - Symptomatic but completely ambulatory  Blood pressure 127/73, pulse 111, temperature 97.4 F (36.3 C), temperature source Oral, resp. rate 19, height 5\' 6"  (1.676 m), weight 194 lb 3.2 oz (88.089 kg).  LABORATORY DATA: Lab Results  Component Value Date   WBC 8.5 03/04/2013   HGB 11.5* 03/04/2013   HCT 34.0* 03/04/2013   MCV 96.7 03/04/2013   PLT 199 03/04/2013      Chemistry      Component Value Date/Time   NA 141 03/04/2013 1157   NA 138 02/25/2013 0428   K 3.3* 03/04/2013 1157   K 3.5 02/25/2013 0428   CL 102 02/25/2013 0428   CL  105 12/17/2012 1015   CO2 24 03/04/2013 1157   CO2 27 02/25/2013 0428   BUN 17.8 03/04/2013 1157   BUN 15 02/25/2013 0428   CREATININE 0.9 03/04/2013 1157   CREATININE 0.80 02/25/2013 0428      Component Value Date/Time   CALCIUM 8.7 03/04/2013 1157   CALCIUM 8.9 02/25/2013 0428   ALKPHOS 121 03/04/2013 1157   ALKPHOS 78 02/24/2013 1500   AST 13 03/04/2013 1157   AST 22 02/24/2013 1500   ALT 17 03/04/2013 1157   ALT 26 02/24/2013 1500   BILITOT 0.97 03/04/2013 1157   BILITOT 0.4 02/24/2013 1500       RADIOGRAPHIC STUDIES: Dg Chest 2 View  01/19/2013   *RADIOLOGY REPORT*  Clinical Data: Cough, congestion and shortness of breath.  CHEST - 2 VIEW  Comparison: Chest x-ray 05/31/2012.  Findings: Right internal jugular single lumen power Port-A-Cath with tip terminating at the superior cavoatrial junction. Lung volumes are normal.  No consolidative airspace disease.  No pleural effusions.  No pneumothorax.  No pulmonary nodule or mass noted. Pulmonary vasculature and the cardiomediastinal silhouette are within normal limits.   Surgical clips project over the right upper quadrant of the abdomen, compatible with prior cholecystectomy.  IMPRESSION: 1. No radiographic evidence of acute cardiopulmonary disease.   Original Report Authenticated By: Trudie Reed, M.D.   Mr Lumbar Spine W Wo Contrast  01/11/2013   *RADIOLOGY REPORT*  Clinical Data: Multiple myeloma.  Being treated with chemotherapy. Right-sided back pain, hip and leg pain.  MRI LUMBAR SPINE WITHOUT AND WITH CONTRAST  Technique:  Multiplanar and multiecho pulse sequences of the lumbar spine were obtained without and with intravenous contrast.  Contrast: 17mL MULTIHANCE GADOBENATE DIMEGLUMINE 529 MG/ML IV SOLN  Comparison: CT 12/23/2012.  MRI 05/05/2010  Findings: The scan covers the region from lower T11-S4.  T11 and T12-4 are negative.  There is a 9 mm tumor deposit within the right pedicle of L1.  There is a 7 mm tumor deposit within the right pedicle of  L2.  The anterior 1/2  of the L2 vertebral body is infiltrated by tumor.  Pathologic endplate Schmorl's nodes superiorly as previously seen.  Fatty marrow replacement at L3 and below indicates previous radiation.  No tumor is seen at the L3 level.  L4 shows an old compression fracture with loss of height of 70% centrally.  No evidence of residual viable tumor in this location.  No tumor is seen at the L5 level.  In the sacrum, there is a punctate tumor deposit at S2 and a 6 mm deposit on the left at S2.  From the standpoint of degenerative disease, there is facet arthropathy at the L2-3 level with edema and enhancement.  There is shallow protrusion of the disc at L4-5 with bilateral facet and ligamentous hypertrophy.  There is stenosis of the lateral recesses left worse than right.  There is a small left foraminal disc herniation at L2-3 that could focally irritate the left L2 nerve root.  IMPRESSION: Sub-centimeter metastases within the right pedicle of L1, the vertebral body at L1, the right pedicle at L2, the central S2 segment and the left sided S2 segment.  2.5 cm metastasis anteriorly within the L2 vertebral body, at the site of the pathologic endplate Schmorl's node.  No neural compression by tumor.  Degenerative changes which could be a cause of back pain. Bilateral facet arthropathy at L2-3.  Lateral recess stenosis at L4- 5 left worse than right.  Left foraminal encroachment by small disc herniation at L2-3.   Original Report Authenticated By: Paulina Fusi, M.D.   Dg Shoulder Left  01/08/2013   *RADIOLOGY REPORT*  Clinical Data: Worsening left shoulder pain for 3-4 months.  LEFT SHOULDER - 2+ VIEW  Comparison: Plain films left shoulder 07/02/2012.  Findings: The humerus is located and the acromioclavicular joint is intact.  Mild acromioclavicular degenerative change is again seen. There is no fracture.  Imaged left lung and ribs appear clear.  IMPRESSION: No acute finding.  Mild acromioclavicular  osteoarthritis.  Stable compared to prior exam.   Original Report Authenticated By: Holley Dexter, M.D.    ASSESSMENT AND PLAN: This is a very pleasant 62 years old African American female with history of multiple myeloma currently undergoing systemic treatment with Velcade and Decadron status post 15 weekly doses and tolerating it fairly well. Her recent MRI of the lumbar spine showed no significant evidence for disease progression but persistent metastatic lesions with no cord compression. She has no evidence for progressive disease in the left shoulder. The patient also has no evidence for disease progression based on the last myeloma panel. Patient was discussed with and seen by Dr. Arbutus Ped. She'll continue with her weekly labs and subcutaneous Velcade as scheduled. She will continue Zometa infusions however we will give this every 3 months. She last received this in June of 2014. We will give Zometa today in addition to her subcutaneous Velcade injection. She'll followup with Dr. Arbutus Ped in approximately one month with repeat protein studies to reevaluate her disease as well as a repeat CBC differential, C. met and LDH. The cause of her loose stool is uncertain whether she was also recently hospital we will have her check a stool sample for C. difficile. Should the simple be positive for C. difficile appropriate antibiotic therapy will be instituted.  Laural Benes, Gema Ringold E, [name]   The patient was advised to call immediately if she has any concerning symptoms in the interval.  All questions were answered. The patient knows to call the clinic with any problems, questions or  concerns. We can certainly see the patient much sooner if necessary.   ADDENDUM: Hematology/Oncology Attending: I have face to face encounter with the patient. I recommended her care plan. She has a diagnosis of recurrent multiple myeloma currently on treatment with Velcade status post 15 cycles. She is tolerating her treatment  fairly well. She was recently diagnosed with a left frontal CVA after she presented with right-sided weakness and she was started on treatment with Plavix. The patient is feeling much better today and she regained the strength of her right upper extremity. I recommended for her to continue her current treatment with Velcade. MRI of the head showed calvarial lesions of myeloma with large right parietal lesion with probable dural invasion. I will consider referring the patient to radiation oncology for consideration of palliative radiotherapy to this area. Lajuana Matte., MD  03/06/2013

## 2013-03-04 NOTE — Telephone Encounter (Signed)
Pt came by today and wants to be seen for labm chemo and ML,pt missed appts on 8/28 due to being hospitalized, pt was upset that appts was changed to 9/5, it is documented that pt was notified, pt is being worked in now  per [employer]

## 2013-03-05 ENCOUNTER — Other Ambulatory Visit: Payer: Medicare Other | Admitting: Lab

## 2013-03-05 ENCOUNTER — Ambulatory Visit: Payer: Medicare Other

## 2013-03-05 ENCOUNTER — Ambulatory Visit: Payer: Medicare Other | Admitting: Physician Assistant

## 2013-03-05 LAB — CLOSTRIDIUM DIFFICILE BY PCR: Toxigenic C. Difficile by PCR: NEGATIVE

## 2013-03-05 NOTE — Patient Instructions (Signed)
Continue weekly labs and chemotherapy as scheduled Followup with Dr. Arbutus Ped in approximately one month with repeat protein studies to reevaluate her disease

## 2013-03-08 ENCOUNTER — Telehealth: Payer: Self-pay | Admitting: Internal Medicine

## 2013-03-08 NOTE — Telephone Encounter (Signed)
s.w. pt and advised on all appt with radiation appt with Dr. Michell Heinrich on 9.11.14 @ 8:00am

## 2013-03-10 NOTE — Progress Notes (Signed)
[facility] [phone]   [phone]  SHARED VISIT PROGRESS NOTE  [name], R, [name] [address], [place]. [address] [place] [address]  DIAGNOSIS: Recurrent multiple myeloma initially diagnosed in October 2008.   PRIOR THERAPY:  1. Status post palliative radiotherapy to the lumbar spine between L3 and L5. The patient received a total dose of 3000 cGy in 10 fractions under the care of Dr. Mitzi Hansen between May 05, 2007 through May 18, 2007. 2. Status post 5 cycles of systemic chemotherapy with Revlimid and low-dose Decadron with good response to this treatment. 3. Status post autologous peripheral blood stem cell transplant at [facility] on Nov 20, 2007 under the care of Dr. Vicente Serene. 4. Status post treatment for disease recurrence with Velcade, Doxil and Decadron. Last dose given Nov 09, 2009. Discontinued secondary to intolerance but the patient had a good response to treatment at that time. 5. Status post palliative radiotherapy to the T2-T6 thoracic vertebrae completed 03/21/2011 under the care of Dr. Mitzi Hansen. 6. Systemic chemotherapy with Velcade 1.3 mg per meter squared given on days 1, 4, 8 and 11, and Doxil at 30 mg per meter squared given on day 4 in addition to Decadron status post 4 cycles, discontinued secondary to intolerance.   CURRENT THERAPY: 1.  systemic therapy with Velcade 1.3 mg/M2 subcutaneously in addition to Decadron 40 mg by mouth on a weekly basis, status post 14 cycles.  2. Zometa 4 mg IV given every 3 months  INTERVAL HISTORY: [name] 62 y.o. female returns to the clinic today for followup visit accompanied by her son.  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 During that admission she was also diagnosed with diabetes mellitus and started on metformin at 250 mg twice daily. She's also noted to have hyperlipidemia as well as mild ICA stenosis and  placed on Lipitor at 40 mg by mouth daily and Plavix 75 mg by mouth daily.. She is currently on Neurontin 600 mg by mouth every 6 hours. The patient is tolerating her treatment with Velcade fairly well. She denied having any significant nausea or vomiting. She denied having any fever or chills. She reports loose watery stools, 2-3 episodes per day. But she also reports some "accidents" that occur at night. She denies noting any blood in this watery stool.  She will continues on acyclovir 400 mg twice a day as prophylaxis.  MEDICAL HISTORY: Past Medical History  Diagnosis Date  . Thyroid disease Hypothyroidism  . Hypercholesterolemia   . Compression fracture 04/08/2007    pathologic compression fracture  . Hypothyroidism   . FHx: chemotherapy     s/p 5 cycle revlimid/low dose decadron,s/p velcade,doxil,decadron,  . Hx of radiation therapy 05/05/07-05/18/07,& 03/05/11-03/21/11-    l3&l5 in 2008, t2-t6 03/2011  . GERD (gastroesophageal reflux disease)   . Insomnia     associated with steroids  . Nausea   . Constipation     takes oxycontin,vicodin  . Hx of radiation therapy 05/05/2007 to 05/18/2007    palliative, L3-5  . 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 Hx of radiation therapy 03/05/2011 to 03/21/2011    palliative T2-T6, c-spine  . 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 PONV (postoperative nausea and vomiting)     ALLERGIES:  has No Known Allergies.  MEDICATIONS:  Current Outpatient Prescriptions  Medication  Sig Dispense Refill  . acyclovir (ZOVIRAX) 800 MG tablet Take 800 mg by mouth 2 (two) times daily.       Marland Kitchen ALPRAZolam (XANAX) 0.5 MG tablet Take 0.5 mg by mouth 3 (three) times daily as needed for anxiety.       Marland Kitchen atorvastatin (LIPITOR) 40 MG tablet Take 1 tablet (40 mg total) by mouth daily.  30 tablet  1  . clopidogrel (PLAVIX) 75 MG tablet Take 1 tablet (75 mg total) by mouth daily with breakfast.  30 tablet  1  . dexamethasone (DECADRON) 4  MG tablet Take 40 mg by mouth once a week. Taken on Thursdays.      Marland Kitchen dicyclomine (BENTYL) 20 MG tablet Take 20 mg by mouth 2 (two) times daily.       Marland Kitchen ESZOPICLONE 3 MG tablet Take 3 mg by mouth at bedtime.       . furosemide (LASIX) 40 MG tablet Take 40 mg by mouth 2 (two) times daily.      Marland Kitchen gabapentin (NEURONTIN) 300 MG capsule Take 600 mg by mouth 4 (four) times daily.       Marland Kitchen glucose blood test strip Use as instructed  100 each  12  . HYDROcodone-acetaminophen (NORCO) 7.5-325 MG per tablet Take 1 tablet by mouth every 6 (six) hours as needed for pain.  30 tablet  0  . levothyroxine (SYNTHROID, LEVOTHROID) 100 MCG tablet Take 100 mcg by mouth daily.       Marland Kitchen lidocaine-prilocaine (EMLA) cream Apply 1 application topically as needed (for port-a-cath access).      Marland Kitchen loperamide (IMODIUM) 2 MG capsule Take 2 mg by mouth 4 (four) times daily as needed for diarrhea or loose stools.      . metFORMIN (GLUCOPHAGE) 500 MG tablet Take 0.5 tablets (250 mg total) by mouth 2 (two) times daily with a meal.  60 tablet  1  . morphine (MS CONTIN) 15 MG 12 hr tablet Take 1 tablet (15 mg total) by mouth 2 (two) times daily.  60 tablet  0  . Multiple Vitamin (MULITIVITAMIN WITH MINERALS) TABS Take 1 tablet by mouth daily.      . ondansetron (ZOFRAN-ODT) 4 MG disintegrating tablet Take 1 tablet by mouth every 8 (eight) hours as needed for nausea.       . pantoprazole (PROTONIX) 40 MG tablet Take 40 mg by mouth daily.      . potassium chloride (K-DUR,KLOR-CON) 10 MEQ tablet Take 10 mEq by mouth 2 (two) times daily.      Marland Kitchen PRESCRIPTION MEDICATION Inject into the vein every 7 (seven) days. Velcade infusion every Thursday.       No current facility-administered medications for this encounter.   Facility-Administered Medications Ordered in Other Encounters  Medication Dose Route Frequency Provider Last Rate Last Dose  . 0.9 %  sodium chloride infusion   Intravenous Once Si Gaul, MD      . ondansetron (ZOFRAN)  IVPB 8 mg  8 mg Intravenous Once Si Gaul, MD      . sodium chloride 0.9 % injection 10 mL  10 mL Intracatheter PRN Si Gaul, MD        SURGICAL HISTORY:  Past Surgical History  Procedure Laterality Date  . Cholecystectomy    . Knee surgery    . Knee surgery    . Video bronchoscopy  07/30/2011    Procedure: VIDEO BRONCHOSCOPY WITHOUT FLUORO;  Surgeon: Barbaraann Share, MD;  Location: [facility];  Service: Cardiopulmonary;  Laterality: Bilateral;    REVIEW OF SYSTEMS:  A comprehensive review of systems was negative except for: Constitutional: positive for fatigue Gastrointestinal: positive for diarrhea Musculoskeletal: positive for muscle weakness and Right-sided muscle weakness is improving Neurological: positive for paresthesia   PHYSICAL EXAMINATION: General appearance: alert, cooperative and no distress Head: Normocephalic, without obvious abnormality, atraumatic Neck: no adenopathy Lymph nodes: Cervical, supraclavicular, and axillary nodes normal. Resp: clear to auscultation bilaterally Cardio: regular rate and rhythm, S1, S2 normal, no murmur, click, rub or gallop GI: soft, non-tender; bowel sounds normal; no masses,  no organomegaly Extremities: extremities normal, atraumatic, no cyanosis or edema Neurologic: Alert and oriented X 3, normal strength and tone. Normal symmetric reflexes. Normal coordination and gait  ECOG PERFORMANCE STATUS: 1 - Symptomatic but completely ambulatory  There were no vitals taken for this visit.  LABORATORY DATA: Lab Results  Component Value Date   WBC 8.5 03/04/2013   HGB 11.5* 03/04/2013   HCT 34.0* 03/04/2013   MCV 96.7 03/04/2013   PLT 199 03/04/2013      Chemistry      Component Value Date/Time   NA 141 03/04/2013 1157   NA 138 02/25/2013 0428   K 3.3* 03/04/2013 1157   K 3.5 02/25/2013 0428   CL 102 02/25/2013 0428   CL 105 12/17/2012 1015   CO2 24 03/04/2013 1157   CO2 27 02/25/2013 0428   BUN 17.8 03/04/2013 1157   BUN 15  02/25/2013 0428   CREATININE 0.9 03/04/2013 1157   CREATININE 0.80 02/25/2013 0428      Component Value Date/Time   CALCIUM 8.7 03/04/2013 1157   CALCIUM 8.9 02/25/2013 0428   ALKPHOS 121 03/04/2013 1157   ALKPHOS 78 02/24/2013 1500   AST 13 03/04/2013 1157   AST 22 02/24/2013 1500   ALT 17 03/04/2013 1157   ALT 26 02/24/2013 1500   BILITOT 0.97 03/04/2013 1157   BILITOT 0.4 02/24/2013 1500       RADIOGRAPHIC STUDIES: Dg Chest 2 View  01/19/2013   *RADIOLOGY REPORT*  Clinical Data: Cough, congestion and shortness of breath.  CHEST - 2 VIEW  Comparison: Chest x-ray 05/31/2012.  Findings: Right internal jugular single lumen power Port-A-Cath with tip terminating at the superior cavoatrial junction. Lung volumes are normal.  No consolidative airspace disease.  No pleural effusions.  No pneumothorax.  No pulmonary nodule or mass noted. Pulmonary vasculature and the cardiomediastinal silhouette are within normal limits.   Surgical clips project over the right upper quadrant of the abdomen, compatible with prior cholecystectomy.  IMPRESSION: 1. No radiographic evidence of acute cardiopulmonary disease.   Original Report Authenticated By: Trudie Reed, M.D.   Mr Lumbar Spine W Wo Contrast  01/11/2013   *RADIOLOGY REPORT*  Clinical Data: Multiple myeloma.  Being treated with chemotherapy. Right-sided back pain, hip and leg pain.  MRI LUMBAR SPINE WITHOUT AND WITH CONTRAST  Technique:  Multiplanar and multiecho pulse sequences of the lumbar spine were obtained without and with intravenous contrast.  Contrast: 17mL MULTIHANCE GADOBENATE DIMEGLUMINE 529 MG/ML IV SOLN  Comparison: CT 12/23/2012.  MRI 05/05/2010  Findings: The scan covers the region from lower T11-S4.  T11 and T12-4 are negative.  There is a 9 mm tumor deposit within the right pedicle of L1.  There is a 7 mm tumor deposit within the right pedicle of L2.  The anterior 1/2  of the L2 vertebral body is infiltrated by tumor.  Pathologic endplate Schmorl's  nodes superiorly as previously seen.  Fatty  marrow replacement at L3 and below indicates previous radiation.  No tumor is seen at the L3 level.  L4 shows an old compression fracture with loss of height of 70% centrally.  No evidence of residual viable tumor in this location.  No tumor is seen at the L5 level.  In the sacrum, there is a punctate tumor deposit at S2 and a 6 mm deposit on the left at S2.  From the standpoint of degenerative disease, there is facet arthropathy at the L2-3 level with edema and enhancement.  There is shallow protrusion of the disc at L4-5 with bilateral facet and ligamentous hypertrophy.  There is stenosis of the lateral recesses left worse than right.  There is a small left foraminal disc herniation at L2-3 that could focally irritate the left L2 nerve root.  IMPRESSION: Sub-centimeter metastases within the right pedicle of L1, the vertebral body at L1, the right pedicle at L2, the central S2 segment and the left sided S2 segment.  2.5 cm metastasis anteriorly within the L2 vertebral body, at the site of the pathologic endplate Schmorl's node.  No neural compression by tumor.  Degenerative changes which could be a cause of back pain. Bilateral facet arthropathy at L2-3.  Lateral recess stenosis at L4- 5 left worse than right.  Left foraminal encroachment by small disc herniation at L2-3.   Original Report Authenticated By: Paulina Fusi, M.D.   Dg Shoulder Left  01/08/2013   *RADIOLOGY REPORT*  Clinical Data: Worsening left shoulder pain for 3-4 months.  LEFT SHOULDER - 2+ VIEW  Comparison: Plain films left shoulder 07/02/2012.  Findings: The humerus is located and the acromioclavicular joint is intact.  Mild acromioclavicular degenerative change is again seen. There is no fracture.  Imaged left lung and ribs appear clear.  IMPRESSION: No acute finding.  Mild acromioclavicular osteoarthritis.  Stable compared to prior exam.   Original Report Authenticated By: Holley Dexter, M.D.     ASSESSMENT AND PLAN: This is a very pleasant 62 years old African American female with history of multiple myeloma currently undergoing systemic treatment with Velcade and Decadron status post 15 weekly doses and tolerating it fairly well. Her recent MRI of the lumbar spine showed no significant evidence for disease progression but persistent metastatic lesions with no cord compression. She has no evidence for progressive disease in the left shoulder. The patient also has no evidence for disease progression based on the last myeloma panel. Patient was discussed with and seen by Dr. Arbutus Ped. She'll continue with her weekly labs and subcutaneous Velcade as scheduled. She will continue Zometa infusions however we will give this every 3 months. She last received this in June of 2014. We will give Zometa today in addition to her subcutaneous Velcade injection. She'll followup with Dr. Arbutus Ped in approximately one month with repeat protein studies to reevaluate her disease as well as a repeat CBC differential, C. met and LDH. The cause of her loose stool is uncertain whether she was also recently hospital we will have her check a stool sample for C. difficile. Should the simple be positive for C. difficile appropriate antibiotic therapy will be instituted.  Lowella Petties, [name]   The patient was advised to call immediately if she has any concerning symptoms in the interval.  All questions were answered. The patient knows to call the clinic with any problems, questions or concerns. We can certainly see the patient much sooner if necessary.   ADDENDUM: Hematology/Oncology Attending: I have face to  face encounter with the patient. I recommended her care plan. She has a diagnosis of recurrent multiple myeloma currently on treatment with Velcade status post 15 cycles. She is tolerating her treatment fairly well. She was recently diagnosed with a left frontal CVA after she presented with right-sided  weakness and she was started on treatment with Plavix. The patient is feeling much better today and she regained the strength of her right upper extremity. I recommended for her to continue her current treatment with Velcade. MRI of the head showed calvarial lesions of myeloma with large right parietal lesion with probable dural invasion. I will consider referring the patient to radiation oncology for consideration of palliative radiotherapy to this area. Lowella Petties, RN  03/06/2013

## 2013-03-10 NOTE — Progress Notes (Signed)
Histology and Location of Primary Cancer:Multiple myeloma  Sites of Visceral and Bony Metastatic Disease:MRA of head completed on 8.27/14reveals to lesion to right parietal bone and occipital bone consistent with myeloma   Location(s) of Symptomatic Metastases:occipital and parietal bone.  Past/Anticipated chemotherapy by medical oncology, if UEA:[id number] of chemotherapy and currently undergoing systemic treatment of Velcade, decadron and zometa  Pain on a scale of 0-10 is:   If Spine Met(s), symptoms, if any, include:  Bowel/Bladder retention or incontinence (please describe):  Numbness or weakness in extremities (please describ  Current Decadron regimen;40 mg weekly on Thursday.  Ambulatory status? Walker? Wheelchair?  SAFETY ISSUES:  Prior radiation? yes  Pacemaker/ICD?no  Possible current pregnancy? no  Is the patient on methotrexate? no  Current Complaints / other details:Patient diagnosed with recurrent multiple myeloma, also with recent diagnosis of acute left frontal CVA and diabetes mellitus.Referred for palliative radiation of large parietal lesion,calvarial lesions found on MRI.

## 2013-03-11 ENCOUNTER — Ambulatory Visit (HOSPITAL_BASED_OUTPATIENT_CLINIC_OR_DEPARTMENT_OTHER): Payer: Medicare Other

## 2013-03-11 ENCOUNTER — Other Ambulatory Visit: Payer: Self-pay | Admitting: Internal Medicine

## 2013-03-11 ENCOUNTER — Other Ambulatory Visit: Payer: Self-pay | Admitting: *Deleted

## 2013-03-11 ENCOUNTER — Ambulatory Visit: Payer: Medicare Other

## 2013-03-11 ENCOUNTER — Ambulatory Visit
Admission: RE | Admit: 2013-03-11 | Discharge: 2013-03-11 | Disposition: A | Discharge status: Home / Self Care | Payer: Medicare Other | Source: Ambulatory Visit | Attending: Radiation Oncology | Admitting: Radiation Oncology

## 2013-03-11 ENCOUNTER — Other Ambulatory Visit (HOSPITAL_BASED_OUTPATIENT_CLINIC_OR_DEPARTMENT_OTHER): Payer: Medicare Other | Admitting: Lab

## 2013-03-11 VITALS — BP 116/65 | HR 100 | Temp 98.3°F

## 2013-03-11 DIAGNOSIS — C9000 Multiple myeloma not having achieved remission: Secondary | ICD-10-CM

## 2013-03-11 DIAGNOSIS — R197 Diarrhea, unspecified: Secondary | ICD-10-CM

## 2013-03-11 DIAGNOSIS — Z5112 Encounter for antineoplastic immunotherapy: Secondary | ICD-10-CM

## 2013-03-11 LAB — CBC WITH DIFFERENTIAL/PLATELET
BASO%: 0.6 % (ref 0.0–2.0)
HCT: 33 % — ABNORMAL LOW (ref 34.8–46.6)
HGB: 11.2 g/dL — ABNORMAL LOW (ref 11.6–15.9)
MCHC: 34 g/dL (ref 31.5–36.0)
MONO#: 0.8 10*3/uL (ref 0.1–0.9)
NEUT%: 69.7 % (ref 38.4–76.8)
RDW: 15.2 % — ABNORMAL HIGH (ref 11.2–14.5)
WBC: 7.3 10*3/uL (ref 3.9–10.3)
lymph#: 1.3 10*3/uL (ref 0.9–3.3)

## 2013-03-11 LAB — COMPREHENSIVE METABOLIC PANEL (CC13)
ALT: 15 U/L (ref 0–55)
AST: 13 U/L (ref 5–34)
CO2: 27 mEq/L (ref 22–29)
Creatinine: 0.9 mg/dL (ref 0.6–1.1)
Total Bilirubin: 0.64 mg/dL (ref 0.20–1.20)

## 2013-03-11 MED ORDER — ONDANSETRON HCL 8 MG PO TABS
ORAL_TABLET | ORAL | Status: AC
  Administered 2013-03-11: 8 mg via ORAL
  Filled 2013-03-11: qty 1

## 2013-03-11 MED ORDER — BORTEZOMIB CHEMO SQ INJECTION 3.5 MG (2.5MG/ML)
1.3000 mg/m2 | Freq: Once | INTRAMUSCULAR | Status: AC
  Administered 2013-03-11: 2.5 mg via SUBCUTANEOUS
  Filled 2013-03-11: qty 2.5

## 2013-03-11 MED ORDER — ONDANSETRON HCL 8 MG PO TABS
8.0000 mg | ORAL_TABLET | Freq: Once | ORAL | Status: AC
  Administered 2013-03-11: 8 mg via ORAL

## 2013-03-11 NOTE — Patient Instructions (Addendum)
[facility] Discharge Instructions for Patients Receiving Chemotherapy  Today you received the following chemotherapy agents: Velcade.  To help prevent nausea and vomiting after your treatment, we encourage you to take your nausea medication as prescribed.   If you develop nausea and vomiting that is not controlled by your nausea medication, call the clinic.   BELOW ARE SYMPTOMS THAT SHOULD BE REPORTED IMMEDIATELY:  *FEVER GREATER THAN 100.5 F  *CHILLS WITH OR WITHOUT FEVER  NAUSEA AND VOMITING THAT IS NOT CONTROLLED WITH YOUR NAUSEA MEDICATION  *UNUSUAL SHORTNESS OF BREATH  *UNUSUAL BRUISING OR BLEEDING  TENDERNESS IN MOUTH AND THROAT WITH OR WITHOUT PRESENCE OF ULCERS  *URINARY PROBLEMS  *BOWEL PROBLEMS  UNUSUAL RASH Items with * indicate a potential emergency and should be followed up as soon as possible.  Feel free to call the clinic you have any questions or concerns. The clinic phone number is [phone].    

## 2013-03-18 ENCOUNTER — Ambulatory Visit (HOSPITAL_BASED_OUTPATIENT_CLINIC_OR_DEPARTMENT_OTHER): Payer: Medicare Other

## 2013-03-18 ENCOUNTER — Ambulatory Visit
Admission: RE | Admit: 2013-03-18 | Discharge: 2013-03-18 | Disposition: A | Discharge status: Home / Self Care | Payer: Medicare Other | Source: Ambulatory Visit | Attending: Radiation Oncology | Admitting: Radiation Oncology

## 2013-03-18 ENCOUNTER — Encounter: Payer: Self-pay | Admitting: Radiation Oncology

## 2013-03-18 ENCOUNTER — Other Ambulatory Visit (HOSPITAL_BASED_OUTPATIENT_CLINIC_OR_DEPARTMENT_OTHER): Payer: Medicare Other | Admitting: Lab

## 2013-03-18 VITALS — BP 128/81 | HR 89 | Temp 97.9°F

## 2013-03-18 VITALS — BP 127/65 | HR 86 | Temp 97.5°F | Ht 66.0 in | Wt 198.8 lb

## 2013-03-18 DIAGNOSIS — C9000 Multiple myeloma not having achieved remission: Secondary | ICD-10-CM

## 2013-03-18 DIAGNOSIS — Z5112 Encounter for antineoplastic immunotherapy: Secondary | ICD-10-CM

## 2013-03-18 DIAGNOSIS — R197 Diarrhea, unspecified: Secondary | ICD-10-CM

## 2013-03-18 DIAGNOSIS — C7951 Secondary malignant neoplasm of bone: Secondary | ICD-10-CM

## 2013-03-18 DIAGNOSIS — M47812 Spondylosis without myelopathy or radiculopathy, cervical region: Secondary | ICD-10-CM | POA: Insufficient documentation

## 2013-03-18 HISTORY — DX: Secondary malignant neoplasm of bone: C79.51

## 2013-03-18 LAB — CBC WITH DIFFERENTIAL/PLATELET
BASO%: 0.7 % (ref 0.0–2.0)
Basophils Absolute: 0.1 10*3/uL (ref 0.0–0.1)
HCT: 33 % — ABNORMAL LOW (ref 34.8–46.6)
HGB: 11.3 g/dL — ABNORMAL LOW (ref 11.6–15.9)
LYMPH%: 19.1 % (ref 14.0–49.7)
MCHC: 34.3 g/dL (ref 31.5–36.0)
MONO#: 0.7 10*3/uL (ref 0.1–0.9)
NEUT%: 70.2 % (ref 38.4–76.8)
Platelets: 189 10*3/uL (ref 145–400)
WBC: 7.7 10*3/uL (ref 3.9–10.3)

## 2013-03-18 LAB — COMPREHENSIVE METABOLIC PANEL (CC13)
AST: 22 U/L (ref 5–34)
Alkaline Phosphatase: 100 U/L (ref 40–150)
BUN: 10 mg/dL (ref 7.0–26.0)
Calcium: 8.6 mg/dL (ref 8.4–10.4)
Chloride: 105 mEq/L (ref 98–109)
Creatinine: 0.9 mg/dL (ref 0.6–1.1)

## 2013-03-18 MED ORDER — ONDANSETRON HCL 8 MG PO TABS
ORAL_TABLET | ORAL | Status: AC
  Filled 2013-03-18: qty 1

## 2013-03-18 MED ORDER — ONDANSETRON HCL 8 MG PO TABS
8.0000 mg | ORAL_TABLET | Freq: Once | ORAL | Status: AC
  Administered 2013-03-18: 8 mg via ORAL

## 2013-03-18 MED ORDER — BORTEZOMIB CHEMO SQ INJECTION 3.5 MG (2.5MG/ML)
1.3000 mg/m2 | Freq: Once | INTRAMUSCULAR | Status: AC
  Administered 2013-03-18: 2.5 mg via SUBCUTANEOUS
  Filled 2013-03-18: qty 2.5

## 2013-03-18 NOTE — Progress Notes (Signed)
[name] here with her family for reconsult for metastases to her occipital and parietal bone.  She has been having pain in her left neck going up behind her ear.  She says it can get up to an 8/10.  She says it hurts to turn her neck at times.  She is currently receiving chemotherapy - velcade injections.  She is also taking decadron 40 mg per week.  She does have frequent diarrhea and says she has incontinence occasionally.  She denies any bladder issues.  She recently had a stroke a few weeks ago and says she has right sided weakness.  She does have pain in her left arm.  She has also had shingles on her left arm recently.  She has had prior palliative radiation to her T2-T6, c-spine in 2012.

## 2013-03-18 NOTE — Patient Instructions (Addendum)
[facility] Discharge Instructions for Patients Receiving Chemotherapy  Today you received the following chemotherapy agents velcade.  To help prevent nausea and vomiting after your treatment, we encourage you to take your nausea medication as prescribed.   If you develop nausea and vomiting that is not controlled by your nausea medication, call the clinic.   BELOW ARE SYMPTOMS THAT SHOULD BE REPORTED IMMEDIATELY:  *FEVER GREATER THAN 100.5 F  *CHILLS WITH OR WITHOUT FEVER  NAUSEA AND VOMITING THAT IS NOT CONTROLLED WITH YOUR NAUSEA MEDICATION  *UNUSUAL SHORTNESS OF BREATH  *UNUSUAL BRUISING OR BLEEDING  TENDERNESS IN MOUTH AND THROAT WITH OR WITHOUT PRESENCE OF ULCERS  *URINARY PROBLEMS  *BOWEL PROBLEMS  UNUSUAL RASH Items with * indicate a potential emergency and should be followed up as soon as possible.  Feel free to call the clinic you have any questions or concerns. The clinic phone number is [phone].    

## 2013-03-18 NOTE — Progress Notes (Signed)
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 Shirline Frees. The patient has a history of multiple myeloma. She has been continuing on systemic treatment with Velcade. She states that she has done well overall with this. She had a recent MRI scan of the brain which showed an increasing lesion in the right parietal/occipital region. I have been asked to see the patient today for evaluation of this area for possible palliative radiation treatment to this side there the patient denies any headaches, nausea, in vision changes. No focal weakness.                              ALLERGIES:  has No Known Allergies.  Meds: Current Outpatient Prescriptions  Medication Sig Dispense Refill  . ALPRAZolam (XANAX) 0.5 MG tablet Take 0.5 mg by mouth 3 (three) times daily as needed for anxiety.       Marland Kitchen atorvastatin (LIPITOR) 40 MG tablet Take 1 tablet (40 mg total) by mouth daily.  30 tablet  1  . clopidogrel (PLAVIX) 75 MG tablet Take 1 tablet (75 mg total) by mouth daily with breakfast.  30 tablet  1  . dexamethasone (DECADRON) 4 MG tablet Take 40 mg by mouth once a week. Taken on Thursdays.      Marland Kitchen dicyclomine (BENTYL) 20 MG tablet Take 20 mg by mouth 2 (two) times daily.       Marland Kitchen ESZOPICLONE 3 MG tablet Take 3 mg by mouth at bedtime.       . furosemide (LASIX) 40 MG tablet Take 40 mg by mouth 2 (two) times daily.      Marland Kitchen gabapentin (NEURONTIN) 300 MG capsule Take 600 mg by mouth 4 (four) times daily.       Marland Kitchen glucose blood test strip Use as instructed  100 each  12  . HYDROcodone-acetaminophen (NORCO) 7.5-325 MG per tablet Take 1 tablet by mouth every 6 (six) hours as needed for pain.  30 tablet  0  . levothyroxine  (SYNTHROID, LEVOTHROID) 100 MCG tablet Take 100 mcg by mouth daily.       Marland Kitchen lidocaine-prilocaine (EMLA) cream Apply 1 application topically as needed (for port-a-cath access).      Marland Kitchen loperamide (IMODIUM) 2 MG capsule Take 2 mg by mouth 4 (four) times daily as needed for diarrhea or loose stools.      . metFORMIN (GLUCOPHAGE) 500 MG tablet Take 0.5 tablets (250 mg total) by mouth 2 (two) times daily with a meal.  60 tablet  1  . morphine (MS CONTIN) 15 MG 12 hr tablet Take 1 tablet (15 mg total) by mouth 2 (two) times daily.  60 tablet  0  . Multiple Vitamin (MULITIVITAMIN WITH MINERALS) TABS Take 1 tablet by mouth daily.      . ondansetron (ZOFRAN-ODT) 4 MG disintegrating tablet Take 1 tablet by mouth every 8 (eight) hours as needed for nausea.       . pantoprazole (PROTONIX) 40 MG tablet Take 40 mg by mouth daily.      . potassium chloride (K-DUR,KLOR-CON) 10 MEQ tablet Take 10 mEq by mouth 2 (two) times daily.      Marland Kitchen  PRESCRIPTION MEDICATION Inject into the vein every 7 (seven) days. Velcade infusion every Thursday.      Marland Kitchen acyclovir (ZOVIRAX) 800 MG tablet Take 800 mg by mouth 2 (two) times daily.        No current facility-administered medications for this encounter.   Facility-Administered Medications Ordered in Other Encounters  Medication Dose Route Frequency Provider Last Rate Last Dose  . 0.9 %  sodium chloride infusion   Intravenous Once Si Gaul, MD      . ondansetron (ZOFRAN) IVPB 8 mg  8 mg Intravenous Once Si Gaul, MD      . sodium chloride 0.9 % injection 10 mL  10 mL Intracatheter PRN Si Gaul, MD        Physical Findings: The patient is in no acute distress. Patient is alert and oriented.  height is 5\' 6"  (1.676 m) and weight is 198 lb 12.8 oz (90.175 kg). Her temperature is 97.5 F (36.4 C). Her blood pressure is 127/65 and her pulse is 86. Her oxygen saturation is 99%. .     Lab Findings: Lab Results  Component Value Date   WBC 7.7 03/18/2013   HGB  11.3* 03/18/2013   HCT 33.0* 03/18/2013   MCV 96.7 03/18/2013   PLT 189 03/18/2013     Radiographic Findings: Ct Head (brain) Wo Contrast  02/24/2013   *RADIOLOGY REPORT*  Clinical Data: Right-sided weakness. Multiple myeloma.  CT HEAD WITHOUT CONTRAST  Technique:  Contiguous axial images were obtained from the base of the skull through the vertex without contrast.  Comparison: 07/20/2011.  Findings: No evidence of an acute infarct, acute hemorrhage, mass lesion, mass effect or hydrocephalus.  There are lytic lesions in the calvarium with the largest seen in the right parietal bone, measuring approximately 0.9 x 3.7 cm (previously 0.9 x 1.7 cm).  Other lesions are stable or minimally larger.  Visualized portions of the paranasal sinuses and mastoid air cells are clear.  IMPRESSION:  1.  No acute intracranial abnormality. 2.  Lytic lesions in the calvarium, some of which are larger than on 07/19/2011, in this patient with a history of multiple myeloma.   Original Report Authenticated By: Leanna Battles, M.D.   Mr [facility] Wo Contrast  02/25/2013   *RADIOLOGY REPORT*  Clinical Data:  Right-sided weakness.  Multiple myeloma.  MRI HEAD WITHOUT AND WITH CONTRAST MRA HEAD WITHOUT CONTRAST  Technique:  Multiplanar, multiecho pulse sequences of the brain and surrounding structures were obtained without and with intravenous contrast.  Angiographic images of the head were obtained using MRA technique without contrast.  Contrast: 18mL MULTIHANCE GADOBENATE DIMEGLUMINE 529 MG/ML IV SOLN  Comparison:  CT head 02/24/2013  MRI HEAD WITHOUT AND WITH CONTRAST  Findings:  Small focus of restricted diffusion in the left posterior frontal cortex over the convexity consistent with acute infarct.  No other acute infarct.  Patchy small white matter hyperintensities bilaterally are nonspecific and may be related to chronic microvascular ischemic change or therapy related change.  Brainstem and cerebellum are normal.  Negative for  hemorrhage.  Large myelomatous lesion of the right parietal bone which is expansile.  There is dural thickening enhancement which may be related to tumor infiltration of the dura.  This lesion measures 10 x 40 mm.  There is another 1 cm lesion in the left parietal bone related to myeloma.  1 cm enhancing lesion present in the superior occipital bone consistent with myeloma.  IMPRESSION: Sub centimeter acute infarct left posterior frontal cortex  over the convexity.  Calvarial lesions of myeloma.  Large right parietal lesion with a probable dural invasion.  MRA HEAD  Findings: Both vertebral arteries are widely patent to the basilar. PICA is patent bilaterally.  The basilar is widely patent.  The superior cerebellar and posterior cerebral arteries are patent bilaterally without stenosis.  The internal carotid artery is patent bilaterally.  Anterior and middle cerebral arteries are widely patent without stenosis. Negative for aneurysm.  IMPRESSION:  Negative   Original Report Authenticated By: Janeece Riggers, M.D.   Mr [facility] AV Contrast  02/25/2013   *RADIOLOGY REPORT*  Clinical Data:  Right-sided weakness.  Multiple myeloma.  MRI HEAD WITHOUT AND WITH CONTRAST MRA HEAD WITHOUT CONTRAST  Technique:  Multiplanar, multiecho pulse sequences of the brain and surrounding structures were obtained without and with intravenous contrast.  Angiographic images of the head were obtained using MRA technique without contrast.  Contrast: 18mL MULTIHANCE GADOBENATE DIMEGLUMINE 529 MG/ML IV SOLN  Comparison:  CT head 02/24/2013  MRI HEAD WITHOUT AND WITH CONTRAST  Findings:  Small focus of restricted diffusion in the left posterior frontal cortex over the convexity consistent with acute infarct.  No other acute infarct.  Patchy small white matter hyperintensities bilaterally are nonspecific and may be related to chronic microvascular ischemic change or therapy related change.  Brainstem and cerebellum are normal.  Negative for  hemorrhage.  Large myelomatous lesion of the right parietal bone which is expansile.  There is dural thickening enhancement which may be related to tumor infiltration of the dura.  This lesion measures 10 x 40 mm.  There is another 1 cm lesion in the left parietal bone related to myeloma.  1 cm enhancing lesion present in the superior occipital bone consistent with myeloma.  IMPRESSION: Sub centimeter acute infarct left posterior frontal cortex over the convexity.  Calvarial lesions of myeloma.  Large right parietal lesion with a probable dural invasion.  MRA HEAD  Findings: Both vertebral arteries are widely patent to the basilar. PICA is patent bilaterally.  The basilar is widely patent.  The superior cerebellar and posterior cerebral arteries are patent bilaterally without stenosis.  The internal carotid artery is patent bilaterally.  Anterior and middle cerebral arteries are widely patent without stenosis. Negative for aneurysm.  IMPRESSION:  Negative   Original Report Authenticated By: Janeece Riggers, M.D.   Mr Cervical Spine W Wo Contrast  02/25/2013   *RADIOLOGY REPORT*  Clinical Data: Multiple myeloma.  Right-sided weakness.  MRI CERVICAL SPINE WITHOUT AND WITH CONTRAST  Technique:  Multiplanar and multiecho pulse sequences of the cervical spine, to include the craniocervical junction and cervicothoracic junction, were obtained according to standard protocol without and with intravenous contrast.  Contrast:  18 ml Multihance.  Comparison: CT 07/02/2012.MRI 02/27/2011.  Findings: Near anatomic alignment cervical spine.  2 mm retrolisthesis of C5 on C6 and 1 mm of retrolisthesis of C6 on C7. Spondylolisthesis appears degenerative in associated with disc disease at both levels.  Cervical cord demonstrates no intramedullary lesion or cord edema.  There are lesions of multiple myeloma which demonstrate post- gadolinium enhancement scattered throughout the cervical spine. Most prominent of these is on the right  side of the C2 vertebra, extending from the odontoid into the C2 body.  There is a larger lesion in the T2 vertebra eccentric to the left and partially visualized T3 lesion present as well.  There is no pathologic compression fracture.  The prevertebral soft tissues appear normal. Flow voids are present in  both vertebral arteries.  There is infiltration of the C7 and C6 spinous processes consistent with myeloma. Disc desiccation is present at every level.  C2-C3:  Negative.  C3-C4:  Shallow broad-based disc osteophyte complex.  No significant central stenosis.  The right foramen is patent.  Left facet arthrosis and uncovertebral spurring produce left foraminal stenosis potentially affecting the left C4 nerve.  C4-C5:  Shallow disc osteophyte complex.  Central canal appears adequately patent.  Mild right foraminal encroachment associated with uncovertebral spurring.  Mild bilateral facet degeneration.  C5-C6:  Moderate central stenosis is present with left eccentric disc osteophyte complex and posterior ligamentum flavum redundancy. There is flattening of the left ventral cord.  The left foraminal stenosis is present secondary the lateral disc protrusion and uncovertebral spurring.  The right foramen appears patent.  C6-C7:  Moderate central stenosis.  Left eccentric disc osteophyte complex flattens the left ventral aspect of the cervical cord. There is left foraminal encroachment due to uncovertebral spurring and lateral disc protrusion potentially affecting the left C7 nerve.  Posterior ligamentum flavum redundancy contributes to central stenosis.  C7-T1:  Disc desiccation without stenosis.  T1-T2:  Shallow broad-based disc bulge without significant stenosis.  IMPRESSION: 1.  Myeloma involvement of the cervical and thoracic spine detailed above.  No pathologic compression fracture or extraosseous extension of tumor. 2.  Moderate cervical spondylosis with moderate left eccentric central stenosis at C5-C6 and C6-C7.   Left foraminal stenosis is also present.  By comparison, the right-sided stenosis is mild in this patient. 3.  When comparing today's exam to the most recent MRI 02/27/2011, there is improvement in the overall enhancement pattern, with less infiltration suggesting treated myeloma.  The enhancement in the bone marrow adjacent to the plasmacytomas is also less vigorous, again consistent with treatment.   Original Report Authenticated By: Andreas Newport, M.D.    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 I have personally reviewed the patient's recent MRI scan of the brain which shows a 4 cm area associated with some dural thickening in the adjacent area.  I discussed the potential role for radiotherapy in this setting with Ms. Markuson. She is familiar with this as she has undergone 2 prior treatments in our Department. We did review once again the potential side effects and risks of treatment. All of her questions were answered. She does wish to proceed with this treatment plan.  Plan:  Simulation in the near future such that we can proceed with treatment planning. I anticipate treating the patient to 30 gray in 10 fractions.  I spent 25 minutes with the patient today, the majority of which was spent counseling the patient on the diagnosis of cancer and coordinating care.   Radene Gunning, M.D., Ph.D.

## 2013-03-22 ENCOUNTER — Other Ambulatory Visit: Payer: Self-pay | Admitting: Radiation Oncology

## 2013-03-22 ENCOUNTER — Ambulatory Visit
Admission: RE | Admit: 2013-03-22 | Discharge: 2013-03-22 | Disposition: A | Discharge status: Home / Self Care | Payer: Medicare Other | Source: Ambulatory Visit | Attending: Radiation Oncology | Admitting: Radiation Oncology

## 2013-03-22 DIAGNOSIS — C9000 Multiple myeloma not having achieved remission: Secondary | ICD-10-CM | POA: Insufficient documentation

## 2013-03-23 ENCOUNTER — Telehealth: Payer: Self-pay | Admitting: *Deleted

## 2013-03-23 NOTE — Telephone Encounter (Addendum)
Left msg with instructions.  SLJ   Message copied by Caren Griffins on Tue Mar 23, 2013 11:14 AM ------      Message from: Conni Slipper      Created: Fri Mar 19, 2013  2:26 PM       Abnormal results, please call and notify patient to increase potassium rich foods in her diet ------

## 2013-03-24 NOTE — Progress Notes (Signed)
  Radiation Oncology         [phone]) [phone] ________________________________  Name: [name] MRN: [id number]  Date: 03/22/2013  DOB: [birth year]  SIMULATION AND TREATMENT PLANNING NOTE  DIAGNOSIS:  Multiple myeloma with skull involvement  NARRATIVE:  The patient was brought to the CT Simulation planning suite.  Identity was confirmed.  All relevant records and images related to the planned course of therapy were reviewed.   Written consent to proceed with treatment was confirmed which was freely given after reviewing the details related to the planned course of therapy had been reviewed with the patient.  Then, the patient was set-up in a stable reproducible  supine position for radiation therapy.  CT images were obtained.  Surface markings were placed.  A customized thermoplastic head chest was instructed to help with patient immobilization. This complex treatment device will be used daily.  The CT images were loaded into the planning software.  Then the target and avoidance structures were contoured.  Treatment planning then occurred.  The radiation prescription was entered and confirmed.  A total of 2 complex treatment devices were fabricated which relate to the designed radiation treatment fields. Each of these customized fields/ complex treatment devices will be used on a daily basis during the radiation course. I have requested : Isodose Plan.   PLAN:  The patient will receive 30 Gy in  10 fractions.  ________________________________   Radene Gunning, MD, PhD

## 2013-03-25 ENCOUNTER — Other Ambulatory Visit (HOSPITAL_BASED_OUTPATIENT_CLINIC_OR_DEPARTMENT_OTHER): Payer: Medicare Other | Admitting: Lab

## 2013-03-25 ENCOUNTER — Ambulatory Visit (HOSPITAL_BASED_OUTPATIENT_CLINIC_OR_DEPARTMENT_OTHER): Payer: Medicare Other

## 2013-03-25 ENCOUNTER — Other Ambulatory Visit: Payer: Self-pay | Admitting: Physician Assistant

## 2013-03-25 VITALS — BP 108/57 | HR 108 | Temp 98.9°F | Resp 18

## 2013-03-25 DIAGNOSIS — Z5112 Encounter for antineoplastic immunotherapy: Secondary | ICD-10-CM

## 2013-03-25 DIAGNOSIS — M899 Disorder of bone, unspecified: Secondary | ICD-10-CM | POA: Insufficient documentation

## 2013-03-25 DIAGNOSIS — Z510 Encounter for antineoplastic radiation therapy: Secondary | ICD-10-CM | POA: Insufficient documentation

## 2013-03-25 DIAGNOSIS — C9000 Multiple myeloma not having achieved remission: Secondary | ICD-10-CM

## 2013-03-25 DIAGNOSIS — M25559 Pain in unspecified hip: Secondary | ICD-10-CM | POA: Insufficient documentation

## 2013-03-25 DIAGNOSIS — R197 Diarrhea, unspecified: Secondary | ICD-10-CM

## 2013-03-25 LAB — COMPREHENSIVE METABOLIC PANEL (CC13)
AST: 16 U/L (ref 5–34)
Albumin: 3.6 g/dL (ref 3.5–5.0)
BUN: 13.1 mg/dL (ref 7.0–26.0)
CO2: 23 mEq/L (ref 22–29)
Calcium: 9.2 mg/dL (ref 8.4–10.4)
Chloride: 106 mEq/L (ref 98–109)
Creatinine: 1 mg/dL (ref 0.6–1.1)
Glucose: 101 mg/dl (ref 70–140)

## 2013-03-25 LAB — CBC WITH DIFFERENTIAL/PLATELET
Basophils Absolute: 0.1 10*3/uL (ref 0.0–0.1)
EOS%: 0.1 % (ref 0.0–7.0)
Eosinophils Absolute: 0 10*3/uL (ref 0.0–0.5)
HCT: 35.5 % (ref 34.8–46.6)
HGB: 12.1 g/dL (ref 11.6–15.9)
MCH: 33 pg (ref 25.1–34.0)
NEUT#: 15.2 10*3/uL — ABNORMAL HIGH (ref 1.5–6.5)
NEUT%: 92.1 % — ABNORMAL HIGH (ref 38.4–76.8)
lymph#: 0.8 10*3/uL — ABNORMAL LOW (ref 0.9–3.3)

## 2013-03-25 MED ORDER — ONDANSETRON HCL 8 MG PO TABS
8.0000 mg | ORAL_TABLET | Freq: Once | ORAL | Status: AC
  Administered 2013-03-25: 8 mg via ORAL

## 2013-03-25 MED ORDER — BORTEZOMIB CHEMO SQ INJECTION 3.5 MG (2.5MG/ML)
1.3000 mg/m2 | Freq: Once | INTRAMUSCULAR | Status: AC
  Administered 2013-03-25: 2.5 mg via SUBCUTANEOUS
  Filled 2013-03-25: qty 2.5

## 2013-03-25 MED ORDER — ONDANSETRON HCL 8 MG PO TABS
ORAL_TABLET | ORAL | Status: AC
  Filled 2013-03-25: qty 1

## 2013-03-25 NOTE — Patient Instructions (Addendum)
[facility] Discharge Instructions for Patients Receiving Chemotherapy  Today you received the following chemotherapy agents: Velcade.  To help prevent nausea and vomiting after your treatment, we encourage you to take your nausea medication as prescribed.   If you develop nausea and vomiting that is not controlled by your nausea medication, call the clinic.   BELOW ARE SYMPTOMS THAT SHOULD BE REPORTED IMMEDIATELY:  *FEVER GREATER THAN 100.5 F  *CHILLS WITH OR WITHOUT FEVER  NAUSEA AND VOMITING THAT IS NOT CONTROLLED WITH YOUR NAUSEA MEDICATION  *UNUSUAL SHORTNESS OF BREATH  *UNUSUAL BRUISING OR BLEEDING  TENDERNESS IN MOUTH AND THROAT WITH OR WITHOUT PRESENCE OF ULCERS  *URINARY PROBLEMS  *BOWEL PROBLEMS  UNUSUAL RASH Items with * indicate a potential emergency and should be followed up as soon as possible.  Feel free to call the clinic you have any questions or concerns. The clinic phone number is [phone].    

## 2013-03-29 ENCOUNTER — Ambulatory Visit
Admission: RE | Admit: 2013-03-29 | Discharge: 2013-03-29 | Disposition: A | Discharge status: Home / Self Care | Payer: Medicare Other | Source: Ambulatory Visit | Attending: Radiation Oncology | Admitting: Radiation Oncology

## 2013-03-29 DIAGNOSIS — C9000 Multiple myeloma not having achieved remission: Secondary | ICD-10-CM

## 2013-03-29 NOTE — Progress Notes (Signed)
  Radiation Oncology         [phone]) [phone] ________________________________  Name: [name] MRN: [id number]  Date: 03/29/2013  DOB: 1951/06/04  Simulation Verification Note   NARRATIVE: The patient was brought to the treatment unit and placed in the planned treatment position. The clinical setup was verified. Then port films were obtained and uploaded to the radiation oncology medical record software.  The treatment beams were carefully compared against the planned radiation fields. The position, location, and shape of the radiation fields was reviewed. The targeted volume of tissue appears to be appropriately covered by the radiation beams. Based on my personal review, I approved the simulation verification. The patient's treatment will proceed as planned.  ________________________________   Radene Gunning, MD, PhD

## 2013-03-30 ENCOUNTER — Ambulatory Visit
Admission: RE | Admit: 2013-03-30 | Discharge: 2013-03-30 | Disposition: A | Discharge status: Home / Self Care | Payer: Medicare Other | Source: Ambulatory Visit | Attending: Radiation Oncology | Admitting: Radiation Oncology

## 2013-03-30 DIAGNOSIS — C801 Malignant (primary) neoplasm, unspecified: Secondary | ICD-10-CM

## 2013-03-30 MED ORDER — RADIAPLEXRX EX GEL
Freq: Once | CUTANEOUS | Status: AC
  Administered 2013-03-30: 08:00:00 via TOPICAL

## 2013-03-30 NOTE — Progress Notes (Signed)
Patient education done, radiaplex gel, radiation therapy and you book ,my business card given to patient, discusses pain, diet, skin irritation, n,v, d, fatigue, all questions answered

## 2013-03-31 ENCOUNTER — Ambulatory Visit
Admission: RE | Admit: 2013-03-31 | Discharge: 2013-03-31 | Disposition: A | Discharge status: Home / Self Care | Payer: Medicare Other | Source: Ambulatory Visit | Attending: Radiation Oncology | Admitting: Radiation Oncology

## 2013-03-31 NOTE — Progress Notes (Signed)
[facility] Psychosocial Distress Screening Clinical Social Work  Clinical Social Work Intern was referred by distress screening protocol.  The patient scored a 6 on the Psychosocial Distress Thermometer which indicates severe distress. Clinical Social Worker Intern telephoned to assess for distress and other psychosocial needs. Patient confirmed that everything was fine and she needed no further assistance.  Clinical Social Worker Intern relayed to Patient that should anything change or Patient have questions to please call.   Clinical Social Worker Intern follow up needed: no  If yes, follow up plan:   [name] S. [facility] Clinical Social Work Intern [employer] [phone]

## 2013-04-01 ENCOUNTER — Other Ambulatory Visit: Payer: Self-pay | Admitting: Physician Assistant

## 2013-04-01 ENCOUNTER — Other Ambulatory Visit (HOSPITAL_BASED_OUTPATIENT_CLINIC_OR_DEPARTMENT_OTHER): Payer: Medicare Other | Admitting: Lab

## 2013-04-01 ENCOUNTER — Ambulatory Visit (HOSPITAL_BASED_OUTPATIENT_CLINIC_OR_DEPARTMENT_OTHER): Payer: Medicare Other

## 2013-04-01 ENCOUNTER — Ambulatory Visit
Admission: RE | Admit: 2013-04-01 | Discharge: 2013-04-01 | Disposition: A | Discharge status: Home / Self Care | Payer: Medicare Other | Source: Ambulatory Visit | Attending: Radiation Oncology | Admitting: Radiation Oncology

## 2013-04-01 VITALS — BP 106/67 | HR 106 | Temp 98.0°F | Resp 20

## 2013-04-01 DIAGNOSIS — Z5112 Encounter for antineoplastic immunotherapy: Secondary | ICD-10-CM

## 2013-04-01 DIAGNOSIS — C9000 Multiple myeloma not having achieved remission: Secondary | ICD-10-CM

## 2013-04-01 DIAGNOSIS — R197 Diarrhea, unspecified: Secondary | ICD-10-CM

## 2013-04-01 LAB — LACTATE DEHYDROGENASE (CC13): LDH: 218 U/L (ref 125–245)

## 2013-04-01 LAB — CBC WITH DIFFERENTIAL/PLATELET
EOS%: 0.6 % (ref 0.0–7.0)
Eosinophils Absolute: 0 10*3/uL (ref 0.0–0.5)
HCT: 32.1 % — ABNORMAL LOW (ref 34.8–46.6)
HGB: 11.1 g/dL — ABNORMAL LOW (ref 11.6–15.9)
LYMPH%: 14.9 % (ref 14.0–49.7)
MCHC: 34.6 g/dL (ref 31.5–36.0)
MCV: 95.9 fL (ref 79.5–101.0)
MONO#: 0.8 10*3/uL (ref 0.1–0.9)
NEUT#: 6 10*3/uL (ref 1.5–6.5)
NEUT%: 74 % (ref 38.4–76.8)
Platelets: 180 10*3/uL (ref 145–400)
RBC: 3.35 10*6/uL — ABNORMAL LOW (ref 3.70–5.45)
WBC: 8.1 10*3/uL (ref 3.9–10.3)
lymph#: 1.2 10*3/uL (ref 0.9–3.3)

## 2013-04-01 LAB — COMPREHENSIVE METABOLIC PANEL (CC13)
ALT: 21 U/L (ref 0–55)
Albumin: 3.2 g/dL — ABNORMAL LOW (ref 3.5–5.0)
CO2: 24 mEq/L (ref 22–29)
Calcium: 8.6 mg/dL (ref 8.4–10.4)
Creatinine: 0.8 mg/dL (ref 0.6–1.1)
Glucose: 97 mg/dl (ref 70–140)
Sodium: 142 mEq/L (ref 136–145)
Total Bilirubin: 1.08 mg/dL (ref 0.20–1.20)
Total Protein: 6.7 g/dL (ref 6.4–8.3)

## 2013-04-01 MED ORDER — ONDANSETRON HCL 8 MG PO TABS
ORAL_TABLET | ORAL | Status: AC
  Filled 2013-04-01: qty 1

## 2013-04-01 MED ORDER — ONDANSETRON HCL 8 MG PO TABS
8.0000 mg | ORAL_TABLET | Freq: Once | ORAL | Status: AC
  Administered 2013-04-01: 8 mg via ORAL

## 2013-04-01 MED ORDER — BORTEZOMIB CHEMO SQ INJECTION 3.5 MG (2.5MG/ML)
1.3000 mg/m2 | Freq: Once | INTRAMUSCULAR | Status: AC
  Administered 2013-04-01: 2.5 mg via SUBCUTANEOUS
  Filled 2013-04-01: qty 2.5

## 2013-04-01 NOTE — Patient Instructions (Addendum)

## 2013-04-02 ENCOUNTER — Ambulatory Visit
Admission: RE | Admit: 2013-04-02 | Discharge: 2013-04-02 | Disposition: A | Discharge status: Home / Self Care | Payer: Medicare Other | Source: Ambulatory Visit | Attending: Radiation Oncology | Admitting: Radiation Oncology

## 2013-04-02 ENCOUNTER — Encounter: Payer: Self-pay | Admitting: Radiation Oncology

## 2013-04-02 VITALS — BP 131/86 | HR 102 | Temp 98.6°F | Resp 20 | Wt 200.6 lb

## 2013-04-02 DIAGNOSIS — C9000 Multiple myeloma not having achieved remission: Secondary | ICD-10-CM

## 2013-04-02 NOTE — Progress Notes (Signed)
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

## 2013-04-03 ENCOUNTER — Other Ambulatory Visit: Payer: Self-pay | Admitting: Internal Medicine

## 2013-04-03 NOTE — Progress Notes (Addendum)
Department of Radiation Oncology  Phone:  [phone] Fax:        [phone]  Weekly Treatment Note    Name: [name] Date: 04/03/2013 MRN: [id number] DOB: 02-26-1951   Current dose: 12 Gy  Current fraction: 4   MEDICATIONS: Current Outpatient Prescriptions  Medication Sig Dispense Refill  . acyclovir (ZOVIRAX) 800 MG tablet Take 800 mg by mouth 2 (two) times daily.       Marland Kitchen ALPRAZolam (XANAX) 0.5 MG tablet Take 0.5 mg by mouth 3 (three) times daily as needed for anxiety.       Marland Kitchen atorvastatin (LIPITOR) 40 MG tablet Take 1 tablet (40 mg total) by mouth daily.  30 tablet  1  . clopidogrel (PLAVIX) 75 MG tablet Take 1 tablet (75 mg total) by mouth daily with breakfast.  30 tablet  1  . dexamethasone (DECADRON) 4 MG tablet Take 40 mg by mouth once a week. Taken on Thursdays.      Marland Kitchen dicyclomine (BENTYL) 20 MG tablet Take 20 mg by mouth 2 (two) times daily.       Marland Kitchen ESZOPICLONE 3 MG tablet Take 3 mg by mouth at bedtime.       . furosemide (LASIX) 40 MG tablet Take 40 mg by mouth 2 (two) times daily.      Marland Kitchen gabapentin (NEURONTIN) 300 MG capsule Take 600 mg by mouth 4 (four) times daily.       Marland Kitchen glucose blood test strip Use as instructed  100 each  12  . hyaluronate sodium (RADIAPLEXRX) GEL Apply 1 application topically 2 (two) times daily. Apply to affected skin area after rad txs and bedtime      . HYDROcodone-acetaminophen (NORCO) 7.5-325 MG per tablet Take 1 tablet by mouth every 6 (six) hours as needed for pain.  30 tablet  0  . levothyroxine (SYNTHROID, LEVOTHROID) 100 MCG tablet Take 100 mcg by mouth daily.       Marland Kitchen lidocaine-prilocaine (EMLA) cream Apply 1 application topically as needed (for port-a-cath access).      Marland Kitchen loperamide (IMODIUM) 2 MG capsule Take 2 mg by mouth 4 (four) times daily as needed for diarrhea or loose stools.      . metFORMIN (GLUCOPHAGE) 500 MG tablet Take 0.5 tablets (250 mg total) by mouth 2 (two) times daily with a meal.  60 tablet  1  .  morphine (MS CONTIN) 15 MG 12 hr tablet Take 1 tablet (15 mg total) by mouth 2 (two) times daily.  60 tablet  0  . Multiple Vitamin (MULITIVITAMIN WITH MINERALS) TABS Take 1 tablet by mouth daily.      . ondansetron (ZOFRAN-ODT) 4 MG disintegrating tablet Take 1 tablet by mouth every 8 (eight) hours as needed for nausea.       . pantoprazole (PROTONIX) 40 MG tablet Take 40 mg by mouth daily.      . potassium chloride (K-DUR,KLOR-CON) 10 MEQ tablet Take 10 mEq by mouth 2 (two) times daily.      Marland Kitchen PRESCRIPTION MEDICATION Inject into the vein every 7 (seven) days. Velcade infusion every Thursday.       No current facility-administered medications for this encounter.   Facility-Administered Medications Ordered in Other Encounters  Medication Dose Route Frequency Provider Last Rate Last Dose  . 0.9 %  sodium chloride infusion   Intravenous Once Si Gaul, MD      . ondansetron (ZOFRAN) IVPB 8 mg  8 mg Intravenous Once Si Gaul, MD      .  sodium chloride 0.9 % injection 10 mL  10 mL Intracatheter PRN Si Gaul, MD         ALLERGIES: Review of patient's allergies indicates no known allergies.   LABORATORY DATA:  Lab Results  Component Value Date   WBC 8.1 04/01/2013   HGB 11.1* 04/01/2013   HCT 32.1* 04/01/2013   MCV 95.9 04/01/2013   PLT 180 04/01/2013   Lab Results  Component Value Date   NA 142 04/01/2013   K 3.4* 04/01/2013   CL 102 02/25/2013   CO2 24 04/01/2013   Lab Results  Component Value Date   ALT 21 04/01/2013   AST 18 04/01/2013   ALKPHOS 89 04/01/2013   BILITOT 1.08 04/01/2013     NARRATIVE: [name] was seen today for weekly treatment management. The chart was checked and the patient's films were reviewed. The patient is doing well with treatment. Did have a headache last night with some lack of balance at times. The patient does complain of some ongoing discomfort in the left hip region.  PHYSICAL EXAMINATION: weight is 200 lb 9.6 oz (90.992  kg). Her oral temperature is 98.6 F (37 C). Her blood pressure is 131/86 and her pulse is 102. Her respiration is 20.        ASSESSMENT: The patient is doing satisfactorily with treatment. I have reviewed her imaging through the pelvis and she does have a lesion in the left pelvis just posterior medial to the left hip joint/acetabulum. No cortical destruction seen on review adjacent to this lesion but this is an area that could cause significant pain/dysfunction with further enlargement. The patient is interested in a short course of palliative radiotherapy to this area and I believe that this is reasonable.  PLAN: We will continue with the patient's radiation treatment as planned.   We will proceed with a simulation as soon as possible to the left hip, hopefully this can be coordinated with her treatment on Monday. I anticipate a 5-8 fraction course of radiation to this area and we will try to begin her treatment as soon as possible to overlap with the current treatment.

## 2013-04-05 ENCOUNTER — Ambulatory Visit: Payer: Medicare Other | Admitting: Radiation Oncology

## 2013-04-05 ENCOUNTER — Other Ambulatory Visit: Payer: Self-pay | Admitting: *Deleted

## 2013-04-05 ENCOUNTER — Ambulatory Visit
Admission: RE | Admit: 2013-04-05 | Discharge: 2013-04-05 | Disposition: A | Discharge status: Home / Self Care | Payer: Medicare Other | Source: Ambulatory Visit | Attending: Radiation Oncology | Admitting: Radiation Oncology

## 2013-04-05 DIAGNOSIS — C9000 Multiple myeloma not having achieved remission: Secondary | ICD-10-CM

## 2013-04-05 LAB — SPEP & IFE WITH QIG
Alpha-1-Globulin: 6.8 % — ABNORMAL HIGH (ref 2.9–4.9)
Alpha-2-Globulin: 14.7 % — ABNORMAL HIGH (ref 7.1–11.8)
Beta Globulin: 7.5 % — ABNORMAL HIGH (ref 4.7–7.2)
Gamma Globulin: 11.5 % (ref 11.1–18.8)
Total Protein, Serum Electrophoresis: 6.2 g/dL (ref 6.0–8.3)

## 2013-04-05 LAB — KAPPA/LAMBDA LIGHT CHAINS: Kappa:Lambda Ratio: 0.44 (ref 0.26–1.65)

## 2013-04-05 LAB — BETA 2 MICROGLOBULIN, SERUM: Beta-2 Microglobulin: 2.1 mg/L — ABNORMAL HIGH (ref 1.01–1.73)

## 2013-04-05 MED ORDER — MORPHINE SULFATE ER 15 MG PO TBCR: 15.0000 mg | EXTENDED_RELEASE_TABLET | Freq: Two times a day (BID) | ORAL | Status: DC

## 2013-04-06 ENCOUNTER — Ambulatory Visit
Admission: RE | Admit: 2013-04-06 | Discharge: 2013-04-06 | Disposition: A | Discharge status: Home / Self Care | Payer: Medicare Other | Source: Ambulatory Visit | Attending: Radiation Oncology | Admitting: Radiation Oncology

## 2013-04-07 ENCOUNTER — Ambulatory Visit
Admission: RE | Admit: 2013-04-07 | Discharge: 2013-04-07 | Disposition: A | Discharge status: Home / Self Care | Payer: Medicare Other | Source: Ambulatory Visit | Attending: Radiation Oncology | Admitting: Radiation Oncology

## 2013-04-08 ENCOUNTER — Other Ambulatory Visit (HOSPITAL_BASED_OUTPATIENT_CLINIC_OR_DEPARTMENT_OTHER): Payer: Medicare Other | Admitting: Lab

## 2013-04-08 ENCOUNTER — Telehealth: Payer: Self-pay | Admitting: Internal Medicine

## 2013-04-08 ENCOUNTER — Ambulatory Visit
Admission: RE | Admit: 2013-04-08 | Discharge: 2013-04-08 | Disposition: A | Discharge status: Home / Self Care | Payer: Medicare Other | Source: Ambulatory Visit | Attending: Radiation Oncology | Admitting: Radiation Oncology

## 2013-04-08 ENCOUNTER — Ambulatory Visit (HOSPITAL_BASED_OUTPATIENT_CLINIC_OR_DEPARTMENT_OTHER): Payer: Medicare Other | Admitting: Internal Medicine

## 2013-04-08 VITALS — BP 117/68 | HR 111 | Temp 98.0°F | Resp 18 | Ht 66.0 in | Wt 197.6 lb

## 2013-04-08 DIAGNOSIS — C9002 Multiple myeloma in relapse: Secondary | ICD-10-CM

## 2013-04-08 DIAGNOSIS — R197 Diarrhea, unspecified: Secondary | ICD-10-CM

## 2013-04-08 DIAGNOSIS — G609 Hereditary and idiopathic neuropathy, unspecified: Secondary | ICD-10-CM

## 2013-04-08 DIAGNOSIS — C9000 Multiple myeloma not having achieved remission: Secondary | ICD-10-CM

## 2013-04-08 LAB — CBC WITH DIFFERENTIAL/PLATELET
Eosinophils Absolute: 0 10*3/uL (ref 0.0–0.5)
HCT: 33.9 % — ABNORMAL LOW (ref 34.8–46.6)
LYMPH%: 14.2 % (ref 14.0–49.7)
MONO#: 0.8 10*3/uL (ref 0.1–0.9)
NEUT#: 6.2 10*3/uL (ref 1.5–6.5)
Platelets: 186 10*3/uL (ref 145–400)
RBC: 3.51 10*6/uL — ABNORMAL LOW (ref 3.70–5.45)
WBC: 8.2 10*3/uL (ref 3.9–10.3)
lymph#: 1.2 10*3/uL (ref 0.9–3.3)

## 2013-04-08 LAB — COMPREHENSIVE METABOLIC PANEL (CC13)
Albumin: 3.3 g/dL — ABNORMAL LOW (ref 3.5–5.0)
Anion Gap: 11 mEq/L (ref 3–11)
BUN: 10.8 mg/dL (ref 7.0–26.0)
CO2: 28 mEq/L (ref 22–29)
Calcium: 8.8 mg/dL (ref 8.4–10.4)
Chloride: 103 mEq/L (ref 98–109)
Glucose: 87 mg/dl (ref 70–140)
Potassium: 3.8 mEq/L (ref 3.5–5.1)
Sodium: 142 mEq/L (ref 136–145)
Total Bilirubin: 1.42 mg/dL — ABNORMAL HIGH (ref 0.20–1.20)
Total Protein: 7 g/dL (ref 6.4–8.3)

## 2013-04-08 NOTE — Progress Notes (Signed)
[facility] [phone]   [phone]  OFFICE PROGRESS NOTE  [name], [name], R, [name] [address] [place] [address]  DIAGNOSIS: Recurrent multiple myeloma initially diagnosed in October 2008.   PRIOR THERAPY:  1. Status post palliative radiotherapy to the lumbar spine between L3 and L5. The patient received a total dose of 3000 cGy in 10 fractions under the care of Dr. Mitzi Hansen between May 05, 2007 through May 18, 2007. 2. Status post 5 cycles of systemic chemotherapy with Revlimid and low-dose Decadron with good response to this treatment. 3. Status post autologous peripheral blood stem cell transplant at [facility] on Nov 20, 2007 under the care of Dr. Vicente Serene. 4. Status post treatment for disease recurrence with Velcade, Doxil and Decadron. Last dose given Nov 09, 2009. Discontinued secondary to intolerance but the patient had a good response to treatment at that time. 5. Status post palliative radiotherapy to the T2-T6 thoracic vertebrae completed 03/21/2011 under the care of Dr. Mitzi Hansen. 6. Systemic chemotherapy with Velcade 1.3 mg per meter squared given on days 1, 4, 8 and 11, and Doxil at 30 mg per meter squared given on day 4 in addition to Decadron status post 4 cycles, discontinued secondary to intolerance. 7. Systemic therapy with Velcade 1.3 mg/M2 subcutaneously in addition to Decadron 40 mg by mouth on a weekly basis, status post 20 cycles. The patient had good response with this treatment but it is discontinue today secondary to worsening peripheral neuropathy. 8. Palliative radiotherapy to the skull lesion as well as the left hip area under the care of Dr. Mitzi Hansen  CURRENT THERAPY:  1. Zometa 4 mg IV given every 3 months   INTERVAL HISTORY: [name] 62 y.o. female returns to the clinic today for followup visit accompanied by her daughter. The patient is tolerating her current treatment with Velcade fairly well  except for worsening peripheral neuropathy. She is currently on Neurontin 600 mg by mouth 4 times a day with minimal improvement in her peripheral neuropathy. The patient is also currently undergoing palliative radiotherapy to the myeloma lesions in the skull and left hip area. She is feeling better with less pain. She had a recent myeloma panel and she is here for evaluation and discussion of her lab results and recommendation regarding treatment of her condition.  MEDICAL HISTORY: Past Medical History  Diagnosis Date  . Thyroid disease Hypothyroidism  . Hypercholesterolemia   . Compression fracture 04/08/2007    pathologic compression fracture  . Hypothyroidism   . FHx: chemotherapy     s/p 5 cycle revlimid/low dose decadron,s/p velcade,doxil,decadron,  . Hx of radiation therapy 05/05/07-05/18/07,& 03/05/11-03/21/11-    l3&l5 in 2008, t2-t6 03/2011  . GERD (gastroesophageal reflux disease)   . Insomnia     associated with steroids  . Nausea   . Constipation     takes oxycontin,vicodin  . Hx of radiation therapy 05/05/2007 to 05/18/2007    palliative, L3-5  . 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 Hx of radiation therapy 03/05/2011 to 03/21/2011    palliative T2-T6, c-spine  . 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 PONV (postoperative nausea and vomiting)   . Metastasis to bone     ALLERGIES:  has No Known Allergies.  MEDICATIONS:  Current Outpatient Prescriptions  Medication Sig Dispense Refill  . acyclovir (ZOVIRAX) 400 MG tablet TAKE 1 TABLET BY MOUTH TWICE A  DAY TO TAKE AFTER ACYCLOVIR 800MG  DONE  60 tablet  1  . acyclovir (ZOVIRAX) 800 MG tablet Take 800 mg by mouth 2 (two) times daily.       Marland Kitchen ALPRAZolam (XANAX) 0.5 MG tablet Take 0.5 mg by mouth 3 (three) times daily as needed for anxiety.       Marland Kitchen atorvastatin (LIPITOR) 40 MG tablet Take 1 tablet (40 mg total) by mouth daily.  30 tablet  1  . clopidogrel (PLAVIX) 75 MG tablet Take 1 tablet (75  mg total) by mouth daily with breakfast.  30 tablet  1  . dexamethasone (DECADRON) 4 MG tablet Take 40 mg by mouth once a week. Taken on Thursdays.      Marland Kitchen dicyclomine (BENTYL) 20 MG tablet Take 20 mg by mouth 2 (two) times daily.       Marland Kitchen ESZOPICLONE 3 MG tablet Take 3 mg by mouth at bedtime.       . furosemide (LASIX) 40 MG tablet Take 40 mg by mouth 2 (two) times daily.      Marland Kitchen gabapentin (NEURONTIN) 300 MG capsule Take 600 mg by mouth 4 (four) times daily.       Marland Kitchen glucose blood test strip Use as instructed  100 each  12  . hyaluronate sodium (RADIAPLEXRX) GEL Apply 1 application topically 2 (two) times daily. Apply to affected skin area after rad txs and bedtime      . HYDROcodone-acetaminophen (NORCO) 7.5-325 MG per tablet Take 1 tablet by mouth every 6 (six) hours as needed for pain.  30 tablet  0  . levothyroxine (SYNTHROID, LEVOTHROID) 100 MCG tablet Take 100 mcg by mouth daily.       Marland Kitchen lidocaine-prilocaine (EMLA) cream Apply 1 application topically as needed (for port-a-cath access).      Marland Kitchen loperamide (IMODIUM) 2 MG capsule Take 2 mg by mouth 4 (four) times daily as needed for diarrhea or loose stools.      . metFORMIN (GLUCOPHAGE) 500 MG tablet Take 0.5 tablets (250 mg total) by mouth 2 (two) times daily with a meal.  60 tablet  1  . morphine (MS CONTIN) 15 MG 12 hr tablet Take 1 tablet (15 mg total) by mouth 2 (two) times daily.  60 tablet  0  . Multiple Vitamin (MULITIVITAMIN WITH MINERALS) TABS Take 1 tablet by mouth daily.      . ondansetron (ZOFRAN-ODT) 4 MG disintegrating tablet Take 1 tablet by mouth every 8 (eight) hours as needed for nausea.       . pantoprazole (PROTONIX) 40 MG tablet Take 40 mg by mouth daily.      . potassium chloride (K-DUR,KLOR-CON) 10 MEQ tablet Take 10 mEq by mouth 2 (two) times daily.      Marland Kitchen PRESCRIPTION MEDICATION Inject into the vein every 7 (seven) days. Velcade infusion every Thursday.       No current facility-administered medications for this visit.     Facility-Administered Medications Ordered in Other Visits  Medication Dose Route Frequency Provider Last Rate Last Dose  . 0.9 %  sodium chloride infusion   Intravenous Once Si Gaul, MD      . ondansetron (ZOFRAN) IVPB 8 mg  8 mg Intravenous Once Si Gaul, MD      . sodium chloride 0.9 % injection 10 mL  10 mL Intracatheter PRN Si Gaul, MD        SURGICAL HISTORY:  Past Surgical History  Procedure Laterality Date  . Cholecystectomy    .  Knee surgery    . Knee surgery    . Video bronchoscopy  07/30/2011    Procedure: VIDEO BRONCHOSCOPY WITHOUT FLUORO;  Surgeon: Barbaraann Share, MD;  Location: [facility];  Service: Cardiopulmonary;  Laterality: Bilateral;    REVIEW OF SYSTEMS:  Constitutional: positive for fatigue Eyes: negative Ears, nose, mouth, throat, and face: negative Respiratory: negative Cardiovascular: negative Gastrointestinal: negative Genitourinary:negative Integument/breast: negative Hematologic/lymphatic: negative Musculoskeletal:positive for back pain Neurological: negative Behavioral/Psych: negative Endocrine: negative Allergic/Immunologic: negative   PHYSICAL EXAMINATION: General appearance: alert, cooperative, fatigued and no distress Head: Normocephalic, without obvious abnormality, atraumatic Neck: no adenopathy, no JVD, supple, symmetrical, trachea midline and thyroid not enlarged, symmetric, no tenderness/mass/nodules Lymph nodes: Cervical, supraclavicular, and axillary nodes normal. Resp: clear to auscultation bilaterally Back: symmetric, no curvature. ROM normal. No CVA tenderness. Cardio: regular rate and rhythm, S1, S2 normal, no murmur, click, rub or gallop GI: soft, non-tender; bowel sounds normal; no masses,  no organomegaly Extremities: extremities normal, atraumatic, no cyanosis or edema Neurologic: Alert and oriented X 3, normal strength and tone. Normal symmetric reflexes. Normal coordination and gait  ECOG  PERFORMANCE STATUS: 1 - Symptomatic but completely ambulatory  Blood pressure 117/68, pulse 111, temperature 98 F (36.7 C), temperature source Oral, resp. rate 18, height 5\' 6"  (1.676 m), weight 197 lb 9.6 oz (89.631 kg), SpO2 98.00%.  LABORATORY DATA: Lab Results  Component Value Date   WBC 8.2 04/08/2013   HGB 11.6 04/08/2013   HCT 33.9* 04/08/2013   MCV 96.5 04/08/2013   PLT 186 04/08/2013      Chemistry      Component Value Date/Time   NA 142 04/08/2013 1015   NA 138 02/25/2013 0428   K 3.8 04/08/2013 1015   K 3.5 02/25/2013 0428   CL 102 02/25/2013 0428   CL 105 12/17/2012 1015   CO2 28 04/08/2013 1015   CO2 27 02/25/2013 0428   BUN 10.8 04/08/2013 1015   BUN 15 02/25/2013 0428   CREATININE 0.9 04/08/2013 1015   CREATININE 0.80 02/25/2013 0428      Component Value Date/Time   CALCIUM 8.8 04/08/2013 1015   CALCIUM 8.9 02/25/2013 0428   ALKPHOS 96 04/08/2013 1015   ALKPHOS 78 02/24/2013 1500   AST 16 04/08/2013 1015   AST 22 02/24/2013 1500   ALT 20 04/08/2013 1015   ALT 26 02/24/2013 1500   BILITOT 1.42* 04/08/2013 1015   BILITOT 0.4 02/24/2013 1500     Other lab results: IgG normal 795, IgA 151 and IgM 23. Free kappa light chain 1.95, free lambda light chain 4.39 with a kappa/lambda ratio 0.44. Beta-2 microglobulin 2.1.  RADIOGRAPHIC STUDIES: No results found.  ASSESSMENT AND PLAN: This is a very pleasant 62 years old [place] American female with multiple myeloma diagnosed in 2008 status post several chemotherapy regimen in the past and most recently treated with Velcade 1.3 mg/M2 subcutaneously with weekly Decadron 40 mg by mouth weekly. She had that improvement in her disease with significant improvement in the myeloma panel but unfortunately worsening peripheral neuropathy. I had a lengthy discussion with the patient and her daughter today about her current disease status and treatment options. I recommended for the patient to have repeat skeletal bone survey for reevaluation of her  bone disease. If she has no significant progression of her bone disease she may be considered for observation and close monitoring for now, she would also a candidate to resume therapy with Carfilzomib with Decadron or combination of Carfilzomib, Cytoxan and  Decadron depending on the final results of the skeletal bone survey. For peripheral neuropathy, the patient will continue on Neurontin 600 mg by mouth 4 times a day. She would come back for followup visit in one week for evaluation and discussion of her imaging studies and further recommendation regarding treatment of her condition. The patient will continue palliative radiotherapy under the care of Dr. Mitzi Hansen as scheduled. She was advised to call immediately if she has any concerning symptoms in the interval.  The patient voices understanding of current disease status and treatment options and is in agreement with the current care plan.  All questions were answered. The patient knows to call the clinic with any problems, questions or concerns. We can certainly see the patient much sooner if necessary.  I spent 15 minutes counseling the patient face to face. The total time spent in the appointment was 25 minutes.

## 2013-04-08 NOTE — Telephone Encounter (Signed)
gv pt appt schedule for October. [facility] will call pt w/bone survey appt.

## 2013-04-08 NOTE — Patient Instructions (Signed)
I will order a skeletal bone survey. Followup visit in one week

## 2013-04-09 ENCOUNTER — Ambulatory Visit
Admission: RE | Admit: 2013-04-09 | Discharge: 2013-04-09 | Disposition: A | Discharge status: Home / Self Care | Payer: Medicare Other | Source: Ambulatory Visit | Attending: Radiation Oncology | Admitting: Radiation Oncology

## 2013-04-09 ENCOUNTER — Other Ambulatory Visit: Payer: Self-pay | Admitting: Medical Oncology

## 2013-04-09 DIAGNOSIS — C9000 Multiple myeloma not having achieved remission: Secondary | ICD-10-CM

## 2013-04-09 MED ORDER — HYDROCODONE-ACETAMINOPHEN 7.5-325 MG PO TABS: 1.0000 | ORAL_TABLET | Freq: Four times a day (QID) | ORAL | Status: DC | PRN

## 2013-04-09 NOTE — Telephone Encounter (Signed)
Pt requests refill I sent the Rx to the pharmacy. To [facility] for auth and called to phamacy.

## 2013-04-09 NOTE — Addendum Note (Signed)
Addended by: Charma Igo on: 04/09/2013 03:37 PM   Modules accepted: Orders

## 2013-04-10 ENCOUNTER — Encounter: Payer: Self-pay | Admitting: Internal Medicine

## 2013-04-12 ENCOUNTER — Encounter: Payer: Self-pay | Admitting: Radiation Oncology

## 2013-04-12 ENCOUNTER — Ambulatory Visit
Admission: RE | Admit: 2013-04-12 | Discharge: 2013-04-12 | Disposition: A | Discharge status: Home / Self Care | Payer: Medicare Other | Source: Ambulatory Visit | Attending: Radiation Oncology | Admitting: Radiation Oncology

## 2013-04-12 ENCOUNTER — Ambulatory Visit (HOSPITAL_COMMUNITY)
Admission: RE | Admit: 2013-04-12 | Discharge: 2013-04-12 | Disposition: A | Discharge status: Home / Self Care | Payer: Medicare Other | Source: Ambulatory Visit | Attending: Internal Medicine | Admitting: Internal Medicine

## 2013-04-12 VITALS — BP 99/67 | HR 96 | Temp 98.6°F | Resp 20 | Wt 202.8 lb

## 2013-04-12 DIAGNOSIS — Z79899 Other long term (current) drug therapy: Secondary | ICD-10-CM | POA: Insufficient documentation

## 2013-04-12 DIAGNOSIS — C9002 Multiple myeloma in relapse: Secondary | ICD-10-CM | POA: Insufficient documentation

## 2013-04-12 DIAGNOSIS — C9000 Multiple myeloma not having achieved remission: Secondary | ICD-10-CM

## 2013-04-12 DIAGNOSIS — M5137 Other intervertebral disc degeneration, lumbosacral region: Secondary | ICD-10-CM | POA: Insufficient documentation

## 2013-04-12 DIAGNOSIS — M51379 Other intervertebral disc degeneration, lumbosacral region without mention of lumbar back pain or lower extremity pain: Secondary | ICD-10-CM | POA: Insufficient documentation

## 2013-04-12 DIAGNOSIS — M5030 Other cervical disc degeneration, unspecified cervical region: Secondary | ICD-10-CM | POA: Insufficient documentation

## 2013-04-12 DIAGNOSIS — Z923 Personal history of irradiation: Secondary | ICD-10-CM | POA: Insufficient documentation

## 2013-04-12 DIAGNOSIS — M47817 Spondylosis without myelopathy or radiculopathy, lumbosacral region: Secondary | ICD-10-CM | POA: Insufficient documentation

## 2013-04-12 DIAGNOSIS — M8448XA Pathological fracture, other site, initial encounter for fracture: Secondary | ICD-10-CM | POA: Insufficient documentation

## 2013-04-12 DIAGNOSIS — M47812 Spondylosis without myelopathy or radiculopathy, cervical region: Secondary | ICD-10-CM | POA: Insufficient documentation

## 2013-04-12 DIAGNOSIS — M899 Disorder of bone, unspecified: Secondary | ICD-10-CM | POA: Insufficient documentation

## 2013-04-12 NOTE — Progress Notes (Signed)
eot today skull/and left hip, pain in hip all way down to her calf, took pain med at 1130 am, pain has eased off, no head ache, good appetite, neuropathy still,  1 month f/u appt card given 3:24 PM

## 2013-04-12 NOTE — Progress Notes (Signed)
Weekly Management Note:  Site: Right skull and left pelvis Current Dose:  3000/2000 and  cGy Projected Dose: 3000/2000  cGy  Narrative: The patient is seen today for routine under treatment assessment. CBCT/MVCT images/port films were reviewed. The chart was reviewed.   She continues to have persistent radicular pain down to her left calf. This is unchanged. She is on MS Contin at 15 mg by mouth twice a day and also hydrocodone/APAP for breakthrough pain.  Physical Examination:  Filed Vitals:   04/12/13 1520  BP: 99/67  Pulse: 96  Temp: 98.6 F (37 C)  Resp: 20  .  Weight: 202 lb 12.8 oz (91.989 kg). No change.  Impression: Tolerating radiation therapy well, but minimal pain relief thus far.  Plan: Radiation therapy completed. Followup visit with Dr. Mitzi Hansen and one month. She'll see Dr. Shirline Frees later this week. She made need to increase her MS Contin.

## 2013-04-15 ENCOUNTER — Telehealth: Payer: Self-pay | Admitting: *Deleted

## 2013-04-15 ENCOUNTER — Telehealth: Payer: Self-pay | Admitting: Internal Medicine

## 2013-04-15 ENCOUNTER — Encounter: Payer: Self-pay | Admitting: Internal Medicine

## 2013-04-15 ENCOUNTER — Ambulatory Visit (HOSPITAL_BASED_OUTPATIENT_CLINIC_OR_DEPARTMENT_OTHER): Payer: Medicare Other | Admitting: Internal Medicine

## 2013-04-15 VITALS — BP 124/73 | HR 113 | Temp 98.9°F | Resp 18 | Ht 66.0 in | Wt 203.2 lb

## 2013-04-15 DIAGNOSIS — C9000 Multiple myeloma not having achieved remission: Secondary | ICD-10-CM

## 2013-04-15 DIAGNOSIS — C9002 Multiple myeloma in relapse: Secondary | ICD-10-CM

## 2013-04-15 DIAGNOSIS — G620 Drug-induced polyneuropathy: Secondary | ICD-10-CM

## 2013-04-15 DIAGNOSIS — T451X5A Adverse effect of antineoplastic and immunosuppressive drugs, initial encounter: Secondary | ICD-10-CM

## 2013-04-15 NOTE — Telephone Encounter (Signed)
Gave pt tentative appt for lab and ML for October and November , eamikled [place] regarding chemo for Monday , 2-Decho @ [facility]: tomorrow , Linda Davis working on precert °

## 2013-04-15 NOTE — Telephone Encounter (Signed)
Gave pt tentative appt for lab and ML for October and November , eamikled [place] regarding chemo for Monday , 2-Decho @ [facility]: tomorrow , Lilyan Punt working on [employer]

## 2013-04-15 NOTE — Telephone Encounter (Signed)
Per POF I have tried to schedule treatments. No openings on 10/20, moved to 10/21. Scheduler advised

## 2013-04-16 ENCOUNTER — Telehealth: Payer: Self-pay | Admitting: Medical Oncology

## 2013-04-16 ENCOUNTER — Ambulatory Visit (HOSPITAL_COMMUNITY)
Admission: RE | Admit: 2013-04-16 | Discharge: 2013-04-16 | Disposition: A | Discharge status: Home / Self Care | Payer: Medicare Other | Source: Ambulatory Visit | Attending: Internal Medicine | Admitting: Internal Medicine

## 2013-04-16 DIAGNOSIS — Z79899 Other long term (current) drug therapy: Secondary | ICD-10-CM | POA: Insufficient documentation

## 2013-04-16 DIAGNOSIS — C9000 Multiple myeloma not having achieved remission: Secondary | ICD-10-CM | POA: Insufficient documentation

## 2013-04-16 DIAGNOSIS — Z09 Encounter for follow-up examination after completed treatment for conditions other than malignant neoplasm: Secondary | ICD-10-CM

## 2013-04-16 NOTE — Telephone Encounter (Signed)
I left message for pt on her phone to increase po fluids.

## 2013-04-16 NOTE — Progress Notes (Signed)
Echocardiogram 2D Echocardiogram has been performed.  Dorothey Baseman 04/16/2013, 11:31 AM

## 2013-04-16 NOTE — Telephone Encounter (Signed)
Message copied by Charma Igo on Fri Apr 16, 2013  3:30 PM ------      Message from: Conni Slipper      Created: Fri Apr 16, 2013 11:45 AM       Abnormal results, please call  and notify patient to push po fluids. Creatinine is slightly elevated ------

## 2013-04-17 NOTE — Patient Instructions (Signed)
We discussed treatment options including proceeding with Carfilzomib and Decadron. Follow up visit in 2 weeks

## 2013-04-17 NOTE — Progress Notes (Signed)
[facility] [phone]   [phone]  OFFICE PROGRESS NOTE  [name], [name], R, [name] [address] [place] [address]  DIAGNOSIS: Recurrent multiple myeloma initially diagnosed in October 2008.  PRIOR THERAPY:  1. Status post palliative radiotherapy to the lumbar spine between L3 and L5. The patient received a total dose of 3000 cGy in 10 fractions under the care of Dr. Mitzi Hansen between May 05, 2007 through May 18, 2007. 2. Status post 5 cycles of systemic chemotherapy with Revlimid and low-dose Decadron with good response to this treatment. 3. Status post autologous peripheral blood stem cell transplant at [facility] on Nov 20, 2007 under the care of Dr. Vicente Serene. 4. Status post treatment for disease recurrence with Velcade, Doxil and Decadron. Last dose given Nov 09, 2009. Discontinued secondary to intolerance but the patient had a good response to treatment at that time. 5. Status post palliative radiotherapy to the T2-T6 thoracic vertebrae completed 03/21/2011 under the care of Dr. Mitzi Hansen. 6. Systemic chemotherapy with Velcade 1.3 mg per meter squared given on days 1, 4, 8 and 11, and Doxil at 30 mg per meter squared given on day 4 in addition to Decadron status post 4 cycles, discontinued secondary to intolerance. 7. Systemic therapy with Velcade 1.3 mg/M2 subcutaneously in addition to Decadron 40 mg by mouth on a weekly basis, status post 20 cycles. The patient had good response with this treatment but it is discontinue today secondary to worsening peripheral neuropathy. 8. Palliative radiotherapy to the skull lesion as well as the left hip area under the care of Dr. Mitzi Hansen CURRENT THERAPY:   1. Systemic chemotherapy with Carfilzomib 20 mg/M2 on days 1, 2,  8, 9, 13 and 16 every 4 weeks in addition to weekly Decadron 40 mg by mouth. First dose on 04/19/2013 2. Zometa 4 mg IV given every 3 months   INTERVAL HISTORY: [name] 62 y.o. female returns to the clinic today for follow up visit accompanied by her son and daughter. The patient is feeling fine today except for the aching pain on the lower back as well as the peripheral neuropathy in the lower extremities. She still on Neurontin 600 mg by mouth 4 times a day. Her treatment with Velcade was discontinued recently secondary to significant peripheral neuropathy. The patient had skeletal bone survey performed recently and she is here for evaluation and discussion of her treatment options. She denied having any significant weight loss or night sweats. The patient denied having any nausea or vomiting. She has no chest pain, shortness of breath, cough or hemoptysis.  MEDICAL HISTORY: Past Medical History  Diagnosis Date  . Thyroid disease Hypothyroidism  . Hypercholesterolemia   . Compression fracture 04/08/2007    pathologic compression fracture  . Hypothyroidism   . FHx: chemotherapy     s/p 5 cycle revlimid/low dose decadron,s/p velcade,doxil,decadron,  . Hx of radiation therapy 05/05/07-05/18/07,& 03/05/11-03/21/11-    l3&l5 in 2008, t2-t6 03/2011  . GERD (gastroesophageal reflux disease)   . Insomnia     associated with steroids  . Nausea   . Constipation     takes oxycontin,vicodin  . Hx of radiation therapy 05/05/2007 to 05/18/2007    palliative, L3-5  . 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 Hx of radiation therapy 03/05/2011 to 03/21/2011    palliative T2-T6, c-spine  . 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  PONV (postoperative nausea and vomiting)   . Metastasis to bone     ALLERGIES:  has No Known Allergies.  MEDICATIONS:  Current Outpatient Prescriptions  Medication Sig Dispense Refill  . ALPRAZolam (XANAX) 0.5 MG tablet Take 0.5 mg by mouth 3 (three) times daily as needed for anxiety.       Marland Kitchen atorvastatin (LIPITOR) 40 MG tablet Take 1 tablet (40 mg total) by mouth daily.  30 tablet  1  . clopidogrel  (PLAVIX) 75 MG tablet Take 1 tablet (75 mg total) by mouth daily with breakfast.  30 tablet  1  . dexamethasone (DECADRON) 4 MG tablet Take 40 mg by mouth once a week. Taken on Thursdays.      Marland Kitchen ESZOPICLONE 3 MG tablet Take 3 mg by mouth at bedtime.       . furosemide (LASIX) 40 MG tablet Take 40 mg by mouth 2 (two) times daily.      Marland Kitchen gabapentin (NEURONTIN) 300 MG capsule Take 600 mg by mouth 4 (four) times daily.       Marland Kitchen glucose blood test strip Use as instructed  100 each  12  . hyaluronate sodium (RADIAPLEXRX) GEL Apply 1 application topically 2 (two) times daily. Apply to affected skin area after rad txs and bedtime      . HYDROcodone-acetaminophen (NORCO) 7.5-325 MG per tablet Take 1 tablet by mouth every 6 (six) hours as needed for pain.  30 tablet  0  . levothyroxine (SYNTHROID, LEVOTHROID) 100 MCG tablet Take 100 mcg by mouth daily.       Marland Kitchen lidocaine-prilocaine (EMLA) cream Apply 1 application topically as needed (for port-a-cath access).      Marland Kitchen loperamide (IMODIUM) 2 MG capsule Take 2 mg by mouth 4 (four) times daily as needed for diarrhea or loose stools.      . metFORMIN (GLUCOPHAGE) 500 MG tablet Take 0.5 tablets (250 mg total) by mouth 2 (two) times daily with a meal.  60 tablet  1  . morphine (MS CONTIN) 15 MG 12 hr tablet Take 1 tablet (15 mg total) by mouth 2 (two) times daily.  60 tablet  0  . Multiple Vitamin (MULITIVITAMIN WITH MINERALS) TABS Take 1 tablet by mouth daily.      . ondansetron (ZOFRAN-ODT) 4 MG disintegrating tablet Take 1 tablet by mouth every 8 (eight) hours as needed for nausea.       . pantoprazole (PROTONIX) 40 MG tablet Take 40 mg by mouth daily.      . potassium chloride (K-DUR,KLOR-CON) 10 MEQ tablet Take 10 mEq by mouth 2 (two) times daily.      Marland Kitchen PRESCRIPTION MEDICATION Inject into the vein every 7 (seven) days. Velcade infusion every Thursday.      . dicyclomine (BENTYL) 20 MG tablet Take 20 mg by mouth 2 (two) times daily.        No current  facility-administered medications for this visit.   Facility-Administered Medications Ordered in Other Visits  Medication Dose Route Frequency Provider Last Rate Last Dose  . 0.9 %  sodium chloride infusion   Intravenous Once Si Gaul, MD      . ondansetron (ZOFRAN) IVPB 8 mg  8 mg Intravenous Once Si Gaul, MD      . sodium chloride 0.9 % injection 10 mL  10 mL Intracatheter PRN Si Gaul, MD        SURGICAL HISTORY:  Past Surgical History  Procedure Laterality Date  . Cholecystectomy    . Knee  surgery    . Knee surgery    . Video bronchoscopy  07/30/2011    Procedure: VIDEO BRONCHOSCOPY WITHOUT FLUORO;  Surgeon: Barbaraann Share, MD;  Location: [facility];  Service: Cardiopulmonary;  Laterality: Bilateral;    REVIEW OF SYSTEMS:  Constitutional: negative Eyes: negative Ears, nose, mouth, throat, and face: negative Respiratory: negative Cardiovascular: negative Gastrointestinal: negative Genitourinary:negative Integument/breast: negative Hematologic/lymphatic: negative Musculoskeletal:positive for back pain Neurological: positive for seizures Behavioral/Psych: negative Endocrine: negative Allergic/Immunologic: negative   PHYSICAL EXAMINATION: General appearance: alert, cooperative, fatigued and no distress Head: Normocephalic, without obvious abnormality, atraumatic Neck: no adenopathy, no JVD, supple, symmetrical, trachea midline and thyroid not enlarged, symmetric, no tenderness/mass/nodules Lymph nodes: Cervical, supraclavicular, and axillary nodes normal. Resp: clear to auscultation bilaterally Back: symmetric, no curvature. ROM normal. No CVA tenderness. Cardio: regular rate and rhythm, S1, S2 normal, no murmur, click, rub or gallop GI: soft, non-tender; bowel sounds normal; no masses,  no organomegaly Extremities: extremities normal, atraumatic, no cyanosis or edema Neurologic: Alert and oriented X 3, normal strength and tone. Normal symmetric  reflexes. Normal coordination and gait  ECOG PERFORMANCE STATUS: 1 - Symptomatic but completely ambulatory  Blood pressure 124/73, pulse 113, temperature 98.9 F (37.2 C), temperature source Oral, resp. rate 18, height 5\' 6"  (1.676 m), weight 203 lb 3.2 oz (92.171 kg), SpO2 95.00%.  LABORATORY DATA: Lab Results  Component Value Date   WBC 8.2 04/08/2013   HGB 11.6 04/08/2013   HCT 33.9* 04/08/2013   MCV 96.5 04/08/2013   PLT 186 04/08/2013      Chemistry      Component Value Date/Time   NA 142 04/08/2013 1015   NA 138 02/25/2013 0428   K 3.8 04/08/2013 1015   K 3.5 02/25/2013 0428   CL 102 02/25/2013 0428   CL 105 12/17/2012 1015   CO2 28 04/08/2013 1015   CO2 27 02/25/2013 0428   BUN 10.8 04/08/2013 1015   BUN 15 02/25/2013 0428   CREATININE 0.9 04/08/2013 1015   CREATININE 0.80 02/25/2013 0428      Component Value Date/Time   CALCIUM 8.8 04/08/2013 1015   CALCIUM 8.9 02/25/2013 0428   ALKPHOS 96 04/08/2013 1015   ALKPHOS 78 02/24/2013 1500   AST 16 04/08/2013 1015   AST 22 02/24/2013 1500   ALT 20 04/08/2013 1015   ALT 26 02/24/2013 1500   BILITOT 1.42* 04/08/2013 1015   BILITOT 0.4 02/24/2013 1500       RADIOGRAPHIC STUDIES: Dg Bone Survey Met  04/12/2013   CLINICAL DATA:  Current history of multiple myeloma, originally diagnosed in 2009 at which time the patient underwent peripheral blood stem cell transplant, recurrence in 2011 for which she continues chemotherapy. Patient has had palliative radiation therapy to the lumbar spine between L3 and L5 in 2008 and to the skull and left pelvis/hip currently. Subsequent encounter for persistent low back pain radiating into the left lower extremity.  EXAM: METASTATIC BONE SURVEY  COMPARISON:  Metastatic bone survey 11/08/2011. Bone window images from CT Head 02/24/2013 and CT abdomen pelvis 01/26/2013.  FINDINGS: Interval coalescence of the adjacent lucent right parietal skull lesions into 1 larger lesion since the prior examination, as noted on  the recent CT head. The left calvarial lesion identified on the CT is superimposed upon the larger right lesion. No new skull lesions.  Degenerative disk disease and spondylosis at C5-6, C6-7, and to a lesser degree C2-3 unchanged. Possible lucent lesions in the upper endplates of C5 and C6,  unchanged. No new abnormalities involving the cervical spine.  Possible lucent lesion in the T4 vertebral body, not visualized previously. Generalized osseous demineralization. T12 with small, rudimentary ribs as noted previously. No thoracic spine fractures.  Stable compression fracture of L4 on the order of 30% or so and stable compression fracture of the upper endplate of L2 on the order of 10-20% or so, with post-radiation changes at L2, L3, and L4. No new abnormalities involving the lumbar spine. Degenerative disk disease and spondylosis at L3-4.  Lucent lesion in the posterior column of the left acetabulum vaguely visible on the x-ray no new abnormalities involving the bony pelvis or either femur. Degenerative changes noted involving the right knee.  Possible new lucent lesion in the left glenoid. No new lesions elsewhere involving either the right or left shoulder girdle or either humerus.  Frontal chest to include the ribs demonstrate no definite rib lesions. Cardiomediastinal silhouette unremarkable. Allowing for expiratory technique, lungs clear. Right jugular Port-A-Cath tip in the lower SVC at or near the cavoatrial junction.  IMPRESSION: 1. Bilateral parietal skull lesions, right much larger than left, as noted on the CT head in August, 2014. No new skull lesions. 2. Possible lucent lesions in the upper endplates of C5 and C6, unchanged. 3. Possible new small lesion in the T4 vertebral body. 4. Stable compression fractures of L4 and L2 and post-radiation changes at L2, L3, and L4. No new lumbar spine lesions. 5. Vaguely visible lucent lesion in the posterior column of the left acetabulum as noted on prior CT abdomen  and pelvis in July, 2014. No new pelvic lesions. 6. Possible new lucent lesion in the left glenoid. 7. No evidence of rib involvement as questioned previously.   Electronically Signed   By: Hulan Saas M.D.   On: 04/12/2013 16:09    ASSESSMENT AND PLAN:  This is a very pleasant 62 years old [place] American female with recurrent multiple myeloma recently completed a course of treatment with Velcade and Decadron with improvement in her disease but this was discontinued secondary to peripheral neuropathy. The recent skeletal bone survey showed few small areas of new lesion but the majority of her lesions were stable.  I have a lengthy discussion with the patient and her family today about her current condition and treatment options.  I recommended for the patient to consider treatment with Carfilzomib starting at a dose of 20 mg/M2 on days 1, 2, 8, 9, 15 and 16 every 4 weeks in addition to weekly Decadron 40 mg by mouth. I discussed with the patient adverse effect of this treatment including but not limited to fatigue, myelosuppression, peripheral neuropathy, and cardiac dysfunction. I will arrange for the patient to have 2-D echo before starting the first dose of this treatment. She is expected to start the first dose of this treatment on 04/19/2013. The patient would come back for follow up visit in 2 weeks for reevaluation and management any adverse effect of her treatment. The patient voices understanding of current disease status and treatment options and is in agreement with the current care plan.  All questions were answered. The patient knows to call the clinic with any problems, questions or concerns. We can certainly see the patient much sooner if necessary.  I spent 15 minutes counseling the patient face to face. The total time spent in the appointment was 25 minutes.

## 2013-04-19 ENCOUNTER — Telehealth: Payer: Self-pay | Admitting: Internal Medicine

## 2013-04-19 ENCOUNTER — Other Ambulatory Visit (HOSPITAL_BASED_OUTPATIENT_CLINIC_OR_DEPARTMENT_OTHER): Payer: Medicare Other | Admitting: Lab

## 2013-04-19 DIAGNOSIS — R197 Diarrhea, unspecified: Secondary | ICD-10-CM

## 2013-04-19 DIAGNOSIS — C9002 Multiple myeloma in relapse: Secondary | ICD-10-CM

## 2013-04-19 DIAGNOSIS — C9000 Multiple myeloma not having achieved remission: Secondary | ICD-10-CM

## 2013-04-19 LAB — COMPREHENSIVE METABOLIC PANEL (CC13)
ALT: 12 U/L (ref 0–55)
AST: 15 U/L (ref 5–34)
Anion Gap: 11 mEq/L (ref 3–11)
BUN: 7.9 mg/dL (ref 7.0–26.0)
CO2: 25 mEq/L (ref 22–29)
Calcium: 9.1 mg/dL (ref 8.4–10.4)
Chloride: 107 mEq/L (ref 98–109)
Sodium: 143 mEq/L (ref 136–145)
Total Protein: 6.9 g/dL (ref 6.4–8.3)

## 2013-04-19 LAB — CBC WITH DIFFERENTIAL/PLATELET
BASO%: 0.7 % (ref 0.0–2.0)
EOS%: 1.8 % (ref 0.0–7.0)
Eosinophils Absolute: 0.1 10*3/uL (ref 0.0–0.5)
HCT: 29.5 % — ABNORMAL LOW (ref 34.8–46.6)
MCH: 33.1 pg (ref 25.1–34.0)
MCHC: 34.6 g/dL (ref 31.5–36.0)
MONO#: 0.7 10*3/uL (ref 0.1–0.9)
NEUT%: 55 % (ref 38.4–76.8)
RBC: 3.08 10*6/uL — ABNORMAL LOW (ref 3.70–5.45)
RDW: 14.9 % — ABNORMAL HIGH (ref 11.2–14.5)
WBC: 3.9 10*3/uL (ref 3.9–10.3)
lymph#: 0.9 10*3/uL (ref 0.9–3.3)

## 2013-04-19 NOTE — Telephone Encounter (Signed)
Pt came in today and chemo was for 10/21, talked to [place] and there is no opening for chemo today, explained to pt's daughter, pt also wants to moved appt on 10/28 to 10/27 lab,md and chemo, emailed  MD  waiting for response

## 2013-04-20 ENCOUNTER — Ambulatory Visit (HOSPITAL_BASED_OUTPATIENT_CLINIC_OR_DEPARTMENT_OTHER): Payer: Medicare Other

## 2013-04-20 VITALS — BP 120/70 | HR 99 | Temp 97.8°F | Resp 20

## 2013-04-20 DIAGNOSIS — C9002 Multiple myeloma in relapse: Secondary | ICD-10-CM

## 2013-04-20 DIAGNOSIS — C9000 Multiple myeloma not having achieved remission: Secondary | ICD-10-CM

## 2013-04-20 DIAGNOSIS — Z5112 Encounter for antineoplastic immunotherapy: Secondary | ICD-10-CM

## 2013-04-20 MED ORDER — ONDANSETRON 8 MG/50ML IVPB (CHCC)
8.0000 mg | Freq: Once | INTRAVENOUS | Status: AC
  Administered 2013-04-20: 8 mg via INTRAVENOUS

## 2013-04-20 MED ORDER — DEXAMETHASONE SODIUM PHOSPHATE 10 MG/ML IJ SOLN
INTRAMUSCULAR | Status: AC
  Filled 2013-04-20: qty 1

## 2013-04-20 MED ORDER — DEXAMETHASONE SODIUM PHOSPHATE 10 MG/ML IJ SOLN
10.0000 mg | Freq: Once | INTRAMUSCULAR | Status: AC
  Administered 2013-04-20: 10 mg via INTRAVENOUS

## 2013-04-20 MED ORDER — HEPARIN SOD (PORK) LOCK FLUSH 100 UNIT/ML IV SOLN
500.0000 [IU] | Freq: Once | INTRAVENOUS | Status: AC | PRN
  Administered 2013-04-20: 500 [IU]
  Filled 2013-04-20: qty 5

## 2013-04-20 MED ORDER — SODIUM CHLORIDE 0.9 % IJ SOLN
10.0000 mL | INTRAMUSCULAR | Status: DC | PRN
  Administered 2013-04-20: 10 mL
  Filled 2013-04-20: qty 10

## 2013-04-20 MED ORDER — CARFILZOMIB CHEMO INJECTION 60 MG
20.0000 mg/m2 | Freq: Once | INTRAVENOUS | Status: AC
  Administered 2013-04-20: 42 mg via INTRAVENOUS
  Filled 2013-04-20: qty 21

## 2013-04-20 MED ORDER — SODIUM CHLORIDE 0.9 % IV SOLN
Freq: Once | INTRAVENOUS | Status: AC
  Administered 2013-04-20: 10:00:00 via INTRAVENOUS

## 2013-04-20 MED ORDER — SODIUM CHLORIDE 0.9 % IV SOLN: Freq: Once | INTRAVENOUS | Status: DC

## 2013-04-20 MED ORDER — ONDANSETRON 8 MG/NS 50 ML IVPB
INTRAVENOUS | Status: AC
  Filled 2013-04-20: qty 8

## 2013-04-20 NOTE — Patient Instructions (Signed)
Carfilzomib injection What is this medicine? CARFILZOMIB is a chemotherapy drug that works by slowing or stopping cancer cell growth. This medicine is used to treat multiple myeloma. This medicine may be used for other purposes; ask your health care provider or pharmacist if you have questions. What should I tell my health care provider before I take this medicine? They need to know if you have any of these conditions: -heart disease -irregular heartbeat -liver disease -lung or breathing disease -an unusual or allergic reaction to carfilzomib, or other medicines, foods, dyes, or preservatives -pregnant or trying to get pregnant -breast-feeding How should I use this medicine? This medicine is for injection or infusion into a vein. It is given by a health care professional in a hospital or clinic setting. Talk to your pediatrician regarding the use of this medicine in children. Special care may be needed. Overdosage: If you think you've taken too much of this medicine contact a poison control center or emergency room at once. Overdosage: If you think you have taken too much of this medicine contact a poison control center or emergency room at once. NOTE: This medicine is only for you. Do not share this medicine with others. What if I miss a dose? It is important not to miss your dose. Call your doctor or health care professional if you are unable to keep an appointment. What may interact with this medicine? Interactions are not expected. Give your health care provider a list of all the medicines, herbs, non-prescription drugs, or dietary supplements you use. Also tell them if you smoke, drink alcohol, or use illegal drugs. Some items may interact with your medicine. This list may not describe all possible interactions. Give your health care provider a list of all the medicines, herbs, non-prescription drugs, or dietary supplements you use. Also tell them if you smoke, drink alcohol, or use  illegal drugs. Some items may interact with your medicine. What should I watch for while using this medicine? Your condition will be monitored carefully while you are receiving this medicine. Report any side effects. Continue your course of treatment even though you feel ill unless your doctor tells you to stop. Call your doctor or health care professional for advice if you get a fever, chills or sore throat, or other symptoms of a cold or flu. Do not treat yourself. Try to avoid being around people who are sick. Do not become pregnant while taking this medicine. Women should inform their doctor if they wish to become pregnant or think they might be pregnant. There is a potential for serious side effects to an unborn child. Talk to your health care professional or pharmacist for more information. Do not breast-feed an infant while taking this medicine. Check with your doctor or health care professional if you get an attack of severe diarrhea, nausea and vomiting, or if you sweat a lot. The loss of too much body fluid can make it dangerous for you to take this medicine. You may get dizzy. Do not drive, use machinery, or do anything that needs mental alertness until you know how this medicine affects you. Do not stand or sit up quickly, especially if you are an older patient. This reduces the risk of dizzy or fainting spells. What side effects may I notice from receiving this medicine? Side effects that you should report to your doctor or health care professional as soon as possible: -allergic reactions like skin rash, itching or hives, swelling of the face, lips, or tongue -breathing  problems -chest pain or palpitationschest tightness -cough -dark urine -dizziness -feeling faint or lightheaded -fever or chills -general ill feeling or flu-like symptoms -light-colored stools -palpitations -right upper belly pain -swelling of the legs or ankles -unusual bleeding or bruising -unusually weak or  tired -yellowing of the eyes or skin  Side effects that usually do not require medical attention (Report these to your doctor or health care professional if they continue or are bothersome.): -diarrhea -headache -nausea, vomiting -tiredness This list may not describe all possible side effects. Call your doctor for medical advice about side effects. You may report side effects to FDA at 1-800-FDA-1088. Where should I keep my medicine? This drug is given in a hospital or clinic and will not be stored at home. NOTE: This sheet is a summary. It may not cover all possible information. If you have questions about this medicine, talk to your doctor, pharmacist, or health care provider.  2013, [employer]. (06/06/2011 4:16:30 PM)

## 2013-04-20 NOTE — Progress Notes (Signed)
Patient reports taking home dose of decadron PO this morning before arriving to cancer center.

## 2013-04-21 ENCOUNTER — Ambulatory Visit (HOSPITAL_BASED_OUTPATIENT_CLINIC_OR_DEPARTMENT_OTHER): Payer: Medicare Other

## 2013-04-21 ENCOUNTER — Telehealth: Payer: Self-pay | Admitting: *Deleted

## 2013-04-21 VITALS — BP 131/73 | HR 97 | Temp 97.4°F

## 2013-04-21 DIAGNOSIS — C9002 Multiple myeloma in relapse: Secondary | ICD-10-CM

## 2013-04-21 DIAGNOSIS — Z5112 Encounter for antineoplastic immunotherapy: Secondary | ICD-10-CM

## 2013-04-21 DIAGNOSIS — C9000 Multiple myeloma not having achieved remission: Secondary | ICD-10-CM

## 2013-04-21 MED ORDER — DEXAMETHASONE SODIUM PHOSPHATE 10 MG/ML IJ SOLN
INTRAMUSCULAR | Status: AC
  Filled 2013-04-21: qty 1

## 2013-04-21 MED ORDER — DEXAMETHASONE SODIUM PHOSPHATE 10 MG/ML IJ SOLN
10.0000 mg | Freq: Once | INTRAMUSCULAR | Status: AC
  Administered 2013-04-21: 10 mg via INTRAVENOUS

## 2013-04-21 MED ORDER — SODIUM CHLORIDE 0.9 % IV SOLN
Freq: Once | INTRAVENOUS | Status: AC
  Administered 2013-04-21: 10:00:00 via INTRAVENOUS

## 2013-04-21 MED ORDER — DEXTROSE 5 % IV SOLN
20.0000 mg/m2 | Freq: Once | INTRAVENOUS | Status: AC
  Administered 2013-04-21: 42 mg via INTRAVENOUS
  Filled 2013-04-21: qty 21

## 2013-04-21 MED ORDER — HEPARIN SOD (PORK) LOCK FLUSH 100 UNIT/ML IV SOLN
500.0000 [IU] | Freq: Once | INTRAVENOUS | Status: AC | PRN
  Administered 2013-04-21: 500 [IU]
  Filled 2013-04-21: qty 5

## 2013-04-21 MED ORDER — ONDANSETRON 8 MG/50ML IVPB (CHCC)
8.0000 mg | Freq: Once | INTRAVENOUS | Status: AC
  Administered 2013-04-21: 8 mg via INTRAVENOUS

## 2013-04-21 MED ORDER — SODIUM CHLORIDE 0.9 % IJ SOLN
10.0000 mL | INTRAMUSCULAR | Status: DC | PRN
  Administered 2013-04-21: 10 mL
  Filled 2013-04-21: qty 10

## 2013-04-21 MED ORDER — ONDANSETRON 8 MG/NS 50 ML IVPB
INTRAVENOUS | Status: AC
  Filled 2013-04-21: qty 8

## 2013-04-21 NOTE — Telephone Encounter (Signed)
I he moved lab appt from Monday to Tuesday

## 2013-04-21 NOTE — Patient Instructions (Signed)
[facility] Discharge Instructions for Patients Receiving Chemotherapy  Today you received the following chemotherapy agents Kyprolis To help prevent nausea and vomiting after your treatment, we encourage you to take your nausea medication as prescribed.  If you develop nausea and vomiting that is not controlled by your nausea medication, call the clinic.   BELOW ARE SYMPTOMS THAT SHOULD BE REPORTED IMMEDIATELY:  *FEVER GREATER THAN 100.5 F  *CHILLS WITH OR WITHOUT FEVER  NAUSEA AND VOMITING THAT IS NOT CONTROLLED WITH YOUR NAUSEA MEDICATION  *UNUSUAL SHORTNESS OF BREATH  *UNUSUAL BRUISING OR BLEEDING  TENDERNESS IN MOUTH AND THROAT WITH OR WITHOUT PRESENCE OF ULCERS  *URINARY PROBLEMS  *BOWEL PROBLEMS  UNUSUAL RASH Items with * indicate a potential emergency and should be followed up as soon as possible.  Feel free to call the clinic you have any questions or concerns. The clinic phone number is [phone].    

## 2013-04-21 NOTE — Progress Notes (Signed)
Pt's chest pain subsided prior to discharge.    Upon arrival RN noticed that pt's legs were swollen and she did complain of minor pain to left leg behind knee.  Dr. Arbutus Ped in to see pt. Per Dr. Arbutus Ped pt is to follow-up with doctor that prescribed lasix.  Dr. Arbutus Ped does not think that this issue is chemo related.  Pt and daughter verbalized understanding and will follow-up.  Pt to call for any other concerns and questions.

## 2013-04-21 NOTE — Progress Notes (Signed)
Patient complains of chest pain and difficulty taking a deep breath in . Patient states this happens every time she takes steroids.Patient denies nausea or radiating pain. Normal saline infusing. Dr. Arbutus Ped notified.

## 2013-04-23 ENCOUNTER — Other Ambulatory Visit: Payer: Self-pay | Admitting: Certified Registered Nurse Anesthetist

## 2013-04-26 ENCOUNTER — Other Ambulatory Visit: Payer: Medicare Other | Admitting: Lab

## 2013-04-26 ENCOUNTER — Ambulatory Visit (HOSPITAL_COMMUNITY)
Admission: RE | Admit: 2013-04-26 | Discharge: 2013-04-26 | Disposition: A | Discharge status: Home / Self Care | Payer: Medicare Other | Source: Ambulatory Visit | Attending: Internal Medicine | Admitting: Internal Medicine

## 2013-04-26 ENCOUNTER — Other Ambulatory Visit: Payer: Self-pay | Admitting: Internal Medicine

## 2013-04-26 DIAGNOSIS — R05 Cough: Secondary | ICD-10-CM | POA: Insufficient documentation

## 2013-04-26 DIAGNOSIS — R059 Cough, unspecified: Secondary | ICD-10-CM | POA: Insufficient documentation

## 2013-04-26 DIAGNOSIS — M4120 Other idiopathic scoliosis, site unspecified: Secondary | ICD-10-CM | POA: Insufficient documentation

## 2013-04-27 ENCOUNTER — Ambulatory Visit: Payer: Medicare Other | Admitting: Physician Assistant

## 2013-04-27 ENCOUNTER — Other Ambulatory Visit (HOSPITAL_BASED_OUTPATIENT_CLINIC_OR_DEPARTMENT_OTHER): Payer: Medicare Other | Admitting: Lab

## 2013-04-27 ENCOUNTER — Other Ambulatory Visit: Payer: Self-pay | Admitting: Internal Medicine

## 2013-04-27 ENCOUNTER — Telehealth: Payer: Self-pay | Admitting: Medical Oncology

## 2013-04-27 ENCOUNTER — Ambulatory Visit (HOSPITAL_BASED_OUTPATIENT_CLINIC_OR_DEPARTMENT_OTHER): Payer: Medicare Other

## 2013-04-27 ENCOUNTER — Other Ambulatory Visit: Payer: Medicare Other | Admitting: Lab

## 2013-04-27 VITALS — BP 128/73 | HR 97 | Temp 100.0°F | Resp 20

## 2013-04-27 DIAGNOSIS — C9000 Multiple myeloma not having achieved remission: Secondary | ICD-10-CM

## 2013-04-27 DIAGNOSIS — M79604 Pain in right leg: Secondary | ICD-10-CM

## 2013-04-27 DIAGNOSIS — C9002 Multiple myeloma in relapse: Secondary | ICD-10-CM

## 2013-04-27 DIAGNOSIS — R197 Diarrhea, unspecified: Secondary | ICD-10-CM

## 2013-04-27 DIAGNOSIS — R7989 Other specified abnormal findings of blood chemistry: Secondary | ICD-10-CM

## 2013-04-27 DIAGNOSIS — Z5112 Encounter for antineoplastic immunotherapy: Secondary | ICD-10-CM

## 2013-04-27 LAB — CBC WITH DIFFERENTIAL/PLATELET
Basophils Absolute: 0 10*3/uL (ref 0.0–0.1)
EOS%: 2.1 % (ref 0.0–7.0)
Eosinophils Absolute: 0.1 10*3/uL (ref 0.0–0.5)
HCT: 28 % — ABNORMAL LOW (ref 34.8–46.6)
HGB: 9.4 g/dL — ABNORMAL LOW (ref 11.6–15.9)
MCH: 32 pg (ref 25.1–34.0)
MONO#: 1 10*3/uL — ABNORMAL HIGH (ref 0.1–0.9)
MONO%: 16.4 % — ABNORMAL HIGH (ref 0.0–14.0)
NEUT#: 3.9 10*3/uL (ref 1.5–6.5)
NEUT%: 65.2 % (ref 38.4–76.8)
RDW: 15 % — ABNORMAL HIGH (ref 11.2–14.5)
WBC: 5.9 10*3/uL (ref 3.9–10.3)
lymph#: 0.9 10*3/uL (ref 0.9–3.3)

## 2013-04-27 LAB — COMPREHENSIVE METABOLIC PANEL (CC13)
ALT: 16 U/L (ref 0–55)
Alkaline Phosphatase: 106 U/L (ref 40–150)
Anion Gap: 11 mEq/L (ref 3–11)
CO2: 24 mEq/L (ref 22–29)
Calcium: 8.8 mg/dL (ref 8.4–10.4)
Chloride: 106 mEq/L (ref 98–109)
Creatinine: 0.8 mg/dL (ref 0.6–1.1)
Glucose: 110 mg/dl (ref 70–140)
Potassium: 3.5 mEq/L (ref 3.5–5.1)
Sodium: 141 mEq/L (ref 136–145)
Total Bilirubin: 1 mg/dL (ref 0.20–1.20)
Total Protein: 6.5 g/dL (ref 6.4–8.3)

## 2013-04-27 MED ORDER — DEXTROSE 5 % IV SOLN
20.0000 mg/m2 | Freq: Once | INTRAVENOUS | Status: AC
  Administered 2013-04-27: 42 mg via INTRAVENOUS
  Filled 2013-04-27: qty 21

## 2013-04-27 MED ORDER — SODIUM CHLORIDE 0.9 % IV SOLN: Freq: Once | INTRAVENOUS | Status: DC

## 2013-04-27 MED ORDER — HEPARIN SOD (PORK) LOCK FLUSH 100 UNIT/ML IV SOLN
500.0000 [IU] | Freq: Once | INTRAVENOUS | Status: AC | PRN
  Administered 2013-04-27: 500 [IU]
  Filled 2013-04-27: qty 5

## 2013-04-27 MED ORDER — SODIUM CHLORIDE 0.9 % IJ SOLN
10.0000 mL | INTRAMUSCULAR | Status: DC | PRN
  Administered 2013-04-27: 10 mL
  Filled 2013-04-27: qty 10

## 2013-04-27 MED ORDER — ONDANSETRON 8 MG/NS 50 ML IVPB
INTRAVENOUS | Status: AC
  Filled 2013-04-27: qty 8

## 2013-04-27 MED ORDER — DEXAMETHASONE SODIUM PHOSPHATE 10 MG/ML IJ SOLN
10.0000 mg | Freq: Once | INTRAMUSCULAR | Status: AC
  Administered 2013-04-27: 10 mg via INTRAVENOUS

## 2013-04-27 MED ORDER — ONDANSETRON 8 MG/50ML IVPB (CHCC)
8.0000 mg | Freq: Once | INTRAVENOUS | Status: AC
  Administered 2013-04-27: 8 mg via INTRAVENOUS

## 2013-04-27 MED ORDER — DEXAMETHASONE SODIUM PHOSPHATE 10 MG/ML IJ SOLN
INTRAMUSCULAR | Status: AC
  Filled 2013-04-27: qty 1

## 2013-04-27 MED ORDER — SODIUM CHLORIDE 0.9 % IV SOLN
Freq: Once | INTRAVENOUS | Status: AC
  Administered 2013-04-27: 16:00:00 via INTRAVENOUS

## 2013-04-27 NOTE — Patient Instructions (Signed)
[facility]cer Center Discharge Instructions for Patients Receiving Chemotherapy  Today you received the following chemotherapy agents: kyprolis  To help prevent nausea and vomiting after your treatment, we encourage you to take your nausea medication.  Take it as often as prescribed.     If you develop nausea and vomiting that is not controlled by your nausea medication, call the clinic. If it is after clinic hours your family physician or the after hours number for the clinic or go to the Emergency Department.   BELOW ARE SYMPTOMS THAT SHOULD BE REPORTED IMMEDIATELY:  *FEVER GREATER THAN 100.5 F  *CHILLS WITH OR WITHOUT FEVER  NAUSEA AND VOMITING THAT IS NOT CONTROLLED WITH YOUR NAUSEA MEDICATION  *UNUSUAL SHORTNESS OF BREATH  *UNUSUAL BRUISING OR BLEEDING  TENDERNESS IN MOUTH AND THROAT WITH OR WITHOUT PRESENCE OF ULCERS  *URINARY PROBLEMS  *BOWEL PROBLEMS  UNUSUAL RASH Items with * indicate a potential emergency and should be followed up as soon as possible.  Feel free to call the clinic you have any questions or concerns. The clinic phone number is [phone].   I have been informed and understand all the instructions given to me. I know to contact the clinic, my physician, or go to the Emergency Department if any problems should occur. I do not have any questions at this time, but understand that I may call the clinic during office hours   should I have any questions or need assistance in obtaining follow up care.    __________________________________________  _____________  __________ Signature of Patient or Authorized Representative            Date                   Time    __________________________________________ Nurse's Signature    

## 2013-04-27 NOTE — Telephone Encounter (Signed)
Left a message for pt to increase dietary potassium and gave example of food to add to diet.

## 2013-04-27 NOTE — Progress Notes (Signed)
Reports seeing her doctor and receiving MMW.  Voice is raspy but denies any sores or trouble swallowing.  Slightly s.o.b but says this happens with exertion.  Denies seasonal allergies.

## 2013-04-27 NOTE — Telephone Encounter (Signed)
Message copied by Charma Igo on Tue Apr 27, 2013  1:28 PM ------      Message from: Conni Slipper      Created: Tue Apr 27, 2013  9:15 AM       Abnormal results, please call  and notify patient to increase her dietary potassium ------

## 2013-04-28 ENCOUNTER — Encounter: Payer: Self-pay | Admitting: *Deleted

## 2013-04-28 ENCOUNTER — Ambulatory Visit (HOSPITAL_BASED_OUTPATIENT_CLINIC_OR_DEPARTMENT_OTHER): Payer: Medicare Other

## 2013-04-28 ENCOUNTER — Ambulatory Visit
Admission: RE | Admit: 2013-04-28 | Discharge: 2013-04-28 | Disposition: A | Discharge status: Home / Self Care | Payer: Medicare Other | Source: Ambulatory Visit | Attending: Internal Medicine | Admitting: Internal Medicine

## 2013-04-28 VITALS — BP 112/59 | HR 105 | Temp 98.9°F | Resp 18

## 2013-04-28 DIAGNOSIS — M79604 Pain in right leg: Secondary | ICD-10-CM

## 2013-04-28 DIAGNOSIS — R7989 Other specified abnormal findings of blood chemistry: Secondary | ICD-10-CM

## 2013-04-28 DIAGNOSIS — C9000 Multiple myeloma not having achieved remission: Secondary | ICD-10-CM

## 2013-04-28 DIAGNOSIS — Z5112 Encounter for antineoplastic immunotherapy: Secondary | ICD-10-CM

## 2013-04-28 DIAGNOSIS — C9002 Multiple myeloma in relapse: Secondary | ICD-10-CM

## 2013-04-28 MED ORDER — DEXTROSE 5 % IV SOLN
20.0000 mg/m2 | Freq: Once | INTRAVENOUS | Status: AC
  Administered 2013-04-28: 42 mg via INTRAVENOUS
  Filled 2013-04-28: qty 21

## 2013-04-28 MED ORDER — DEXAMETHASONE SODIUM PHOSPHATE 10 MG/ML IJ SOLN
10.0000 mg | Freq: Once | INTRAMUSCULAR | Status: AC
  Administered 2013-04-28: 10 mg via INTRAVENOUS

## 2013-04-28 MED ORDER — ONDANSETRON 8 MG/50ML IVPB (CHCC)
8.0000 mg | Freq: Once | INTRAVENOUS | Status: AC
  Administered 2013-04-28: 8 mg via INTRAVENOUS

## 2013-04-28 MED ORDER — HEPARIN SOD (PORK) LOCK FLUSH 100 UNIT/ML IV SOLN
500.0000 [IU] | Freq: Once | INTRAVENOUS | Status: AC | PRN
  Administered 2013-04-28: 500 [IU]
  Filled 2013-04-28: qty 5

## 2013-04-28 MED ORDER — SODIUM CHLORIDE 0.9 % IV SOLN
Freq: Once | INTRAVENOUS | Status: AC
  Administered 2013-04-28: 09:00:00 via INTRAVENOUS

## 2013-04-28 MED ORDER — DEXAMETHASONE SODIUM PHOSPHATE 10 MG/ML IJ SOLN
INTRAMUSCULAR | Status: AC
  Filled 2013-04-28: qty 1

## 2013-04-28 MED ORDER — ONDANSETRON 8 MG/NS 50 ML IVPB
INTRAVENOUS | Status: AC
  Filled 2013-04-28: qty 8

## 2013-04-28 MED ORDER — SODIUM CHLORIDE 0.9 % IJ SOLN
10.0000 mL | INTRAMUSCULAR | Status: DC | PRN
  Administered 2013-04-28: 10 mL
  Filled 2013-04-28: qty 10

## 2013-04-28 NOTE — Progress Notes (Signed)
Chaplain met with patient in infusion area. Patient was friendly and engaged. Chaplain chatted with her about her desire to help people and her interest in becoming a chaplain. 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 Patient also shared how she likes helping patients connect with urban ministries and the [employer] to help them get what they need. Chaplain helped to clarify these different roles, asking the patient if she's more interested in the ministry, medical, of social work side of things more. 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 Patient seemed unclear about which profession she's most drawn to. 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 Chaplain discussed with her how she can take care of herself while also doing what she loves, which is helping others. Patient said she would follow up with one of the chaplains to talk more about going into a helping profession.

## 2013-04-28 NOTE — Patient Instructions (Signed)
[facility] Discharge Instructions for Patients Receiving Chemotherapy  Today you received the following chemotherapy agents Kyprolis To help prevent nausea and vomiting after your treatment, we encourage you to take your nausea medication as prescribed.  If you develop nausea and vomiting that is not controlled by your nausea medication, call the clinic.   BELOW ARE SYMPTOMS THAT SHOULD BE REPORTED IMMEDIATELY:  *FEVER GREATER THAN 100.5 F  *CHILLS WITH OR WITHOUT FEVER  NAUSEA AND VOMITING THAT IS NOT CONTROLLED WITH YOUR NAUSEA MEDICATION  *UNUSUAL SHORTNESS OF BREATH  *UNUSUAL BRUISING OR BLEEDING  TENDERNESS IN MOUTH AND THROAT WITH OR WITHOUT PRESENCE OF ULCERS  *URINARY PROBLEMS  *BOWEL PROBLEMS  UNUSUAL RASH Items with * indicate a potential emergency and should be followed up as soon as possible.  Feel free to call the clinic you have any questions or concerns. The clinic phone number is [phone].    

## 2013-05-01 ENCOUNTER — Emergency Department (HOSPITAL_COMMUNITY): Payer: Medicare Other

## 2013-05-01 ENCOUNTER — Encounter (HOSPITAL_COMMUNITY): Payer: Self-pay | Admitting: Emergency Medicine

## 2013-05-01 ENCOUNTER — Inpatient Hospital Stay (HOSPITAL_COMMUNITY)
Admission: EM | Admit: 2013-05-01 | Discharge: 2013-05-04 | DRG: 194 | Disposition: A | Discharge status: Home Health | Payer: Medicare Other | Attending: Internal Medicine | Admitting: Internal Medicine

## 2013-05-01 ENCOUNTER — Other Ambulatory Visit: Payer: Self-pay

## 2013-05-01 DIAGNOSIS — J189 Pneumonia, unspecified organism: Principal | ICD-10-CM

## 2013-05-01 DIAGNOSIS — Z9221 Personal history of antineoplastic chemotherapy: Secondary | ICD-10-CM

## 2013-05-01 DIAGNOSIS — Z87891 Personal history of nicotine dependence: Secondary | ICD-10-CM

## 2013-05-01 DIAGNOSIS — R5381 Other malaise: Secondary | ICD-10-CM | POA: Diagnosis present

## 2013-05-01 DIAGNOSIS — Z9484 Stem cells transplant status: Secondary | ICD-10-CM

## 2013-05-01 DIAGNOSIS — R079 Chest pain, unspecified: Secondary | ICD-10-CM

## 2013-05-01 DIAGNOSIS — Z8673 Personal history of transient ischemic attack (TIA), and cerebral infarction without residual deficits: Secondary | ICD-10-CM

## 2013-05-01 DIAGNOSIS — R7302 Impaired glucose tolerance (oral): Secondary | ICD-10-CM | POA: Diagnosis present

## 2013-05-01 DIAGNOSIS — Z801 Family history of malignant neoplasm of trachea, bronchus and lung: Secondary | ICD-10-CM

## 2013-05-01 DIAGNOSIS — R0600 Dyspnea, unspecified: Secondary | ICD-10-CM

## 2013-05-01 DIAGNOSIS — D649 Anemia, unspecified: Secondary | ICD-10-CM | POA: Diagnosis present

## 2013-05-01 DIAGNOSIS — I5189 Other ill-defined heart diseases: Secondary | ICD-10-CM

## 2013-05-01 DIAGNOSIS — R0789 Other chest pain: Secondary | ICD-10-CM | POA: Diagnosis present

## 2013-05-01 DIAGNOSIS — G8929 Other chronic pain: Secondary | ICD-10-CM

## 2013-05-01 DIAGNOSIS — Z803 Family history of malignant neoplasm of breast: Secondary | ICD-10-CM

## 2013-05-01 DIAGNOSIS — Z923 Personal history of irradiation: Secondary | ICD-10-CM

## 2013-05-01 DIAGNOSIS — C9002 Multiple myeloma in relapse: Secondary | ICD-10-CM | POA: Diagnosis present

## 2013-05-01 DIAGNOSIS — E039 Hypothyroidism, unspecified: Secondary | ICD-10-CM | POA: Diagnosis present

## 2013-05-01 DIAGNOSIS — G609 Hereditary and idiopathic neuropathy, unspecified: Secondary | ICD-10-CM | POA: Diagnosis present

## 2013-05-01 DIAGNOSIS — C9000 Multiple myeloma not having achieved remission: Secondary | ICD-10-CM

## 2013-05-01 DIAGNOSIS — R0989 Other specified symptoms and signs involving the circulatory and respiratory systems: Secondary | ICD-10-CM | POA: Diagnosis present

## 2013-05-01 DIAGNOSIS — E785 Hyperlipidemia, unspecified: Secondary | ICD-10-CM | POA: Diagnosis present

## 2013-05-01 DIAGNOSIS — R7309 Other abnormal glucose: Secondary | ICD-10-CM | POA: Diagnosis present

## 2013-05-01 DIAGNOSIS — E782 Mixed hyperlipidemia: Secondary | ICD-10-CM | POA: Diagnosis present

## 2013-05-01 DIAGNOSIS — D630 Anemia in neoplastic disease: Secondary | ICD-10-CM | POA: Diagnosis present

## 2013-05-01 LAB — BASIC METABOLIC PANEL
BUN: 11 mg/dL (ref 6–23)
CO2: 27 mEq/L (ref 19–32)
Calcium: 8.8 mg/dL (ref 8.4–10.5)
Chloride: 101 mEq/L (ref 96–112)
Creatinine, Ser: 0.82 mg/dL (ref 0.50–1.10)
GFR calc Af Amer: 88 mL/min — ABNORMAL LOW (ref >90)
GFR calc non Af Amer: 76 mL/min — ABNORMAL LOW (ref >90)
Glucose, Bld: 100 mg/dL — ABNORMAL HIGH (ref 70–99)
Potassium: 3.2 mEq/L — ABNORMAL LOW (ref 3.5–5.1)
Sodium: 138 mEq/L (ref 135–145)

## 2013-05-01 LAB — URINALYSIS, ROUTINE W REFLEX MICROSCOPIC
Bilirubin Urine: NEGATIVE
Glucose, UA: NEGATIVE mg/dL
Hgb urine dipstick: NEGATIVE
Ketones, ur: NEGATIVE mg/dL
Leukocytes, UA: NEGATIVE
Nitrite: NEGATIVE
Protein, ur: NEGATIVE mg/dL
Specific Gravity, Urine: 1.016 (ref 1.005–1.030)
Urobilinogen, UA: 0.2 mg/dL (ref 0.0–1.0)
pH: 6 (ref 5.0–8.0)

## 2013-05-01 LAB — GLUCOSE, CAPILLARY
Glucose-Capillary: 109 mg/dL — ABNORMAL HIGH (ref 70–99)
Glucose-Capillary: 120 mg/dL — ABNORMAL HIGH (ref 70–99)

## 2013-05-01 LAB — PRO B NATRIURETIC PEPTIDE: Pro B Natriuretic peptide (BNP): 130 pg/mL — ABNORMAL HIGH (ref 0–125)

## 2013-05-01 LAB — CBC
HCT: 32.6 % — ABNORMAL LOW (ref 36.0–46.0)
Hemoglobin: 11 g/dL — ABNORMAL LOW (ref 12.0–15.0)
MCH: 32.4 pg (ref 26.0–34.0)
MCHC: 33.7 g/dL (ref 30.0–36.0)
MCV: 96.2 fL (ref 78.0–100.0)
Platelets: 199 10*3/uL (ref 150–400)
RBC: 3.39 MIL/uL — ABNORMAL LOW (ref 3.87–5.11)
RDW: 14.6 % (ref 11.5–15.5)
WBC: 14.5 10*3/uL — ABNORMAL HIGH (ref 4.0–10.5)

## 2013-05-01 LAB — TROPONIN I: Troponin I: <0.3 ng/mL (ref <0.30)

## 2013-05-01 MED ORDER — VANCOMYCIN HCL IN DEXTROSE 1-5 GM/200ML-% IV SOLN
1000.0000 mg | Freq: Once | INTRAVENOUS | Status: AC
  Administered 2013-05-01: 1000 mg via INTRAVENOUS
  Filled 2013-05-01: qty 200

## 2013-05-01 MED ORDER — INSULIN ASPART 100 UNIT/ML ~~LOC~~ SOLN
0.0000 [IU] | Freq: Three times a day (TID) | SUBCUTANEOUS | Status: DC
  Administered 2013-05-03 – 2013-05-04 (×2): 1 [IU] via SUBCUTANEOUS

## 2013-05-01 MED ORDER — ONDANSETRON HCL 4 MG/2ML IJ SOLN: 4.0000 mg | Freq: Four times a day (QID) | INTRAMUSCULAR | Status: DC | PRN

## 2013-05-01 MED ORDER — FUROSEMIDE 40 MG PO TABS
40.0000 mg | ORAL_TABLET | Freq: Two times a day (BID) | ORAL | Status: DC
  Administered 2013-05-01 – 2013-05-04 (×6): 40 mg via ORAL
  Filled 2013-05-01 (×8): qty 1

## 2013-05-01 MED ORDER — PANTOPRAZOLE SODIUM 40 MG PO TBEC
40.0000 mg | DELAYED_RELEASE_TABLET | Freq: Every day | ORAL | Status: DC
  Administered 2013-05-02 – 2013-05-04 (×4): 40 mg via ORAL
  Filled 2013-05-01 (×4): qty 1

## 2013-05-01 MED ORDER — GABAPENTIN 300 MG PO CAPS
600.0000 mg | ORAL_CAPSULE | Freq: Four times a day (QID) | ORAL | Status: DC
  Administered 2013-05-01 – 2013-05-04 (×10): 600 mg via ORAL
  Filled 2013-05-01 (×13): qty 2

## 2013-05-01 MED ORDER — ONDANSETRON HCL 4 MG PO TABS: 4.0000 mg | ORAL_TABLET | Freq: Four times a day (QID) | ORAL | Status: DC | PRN

## 2013-05-01 MED ORDER — LEVOTHYROXINE SODIUM 100 MCG PO TABS
100.0000 ug | ORAL_TABLET | Freq: Every day | ORAL | Status: DC
  Administered 2013-05-02 – 2013-05-04 (×3): 100 ug via ORAL
  Filled 2013-05-01 (×5): qty 1

## 2013-05-01 MED ORDER — SODIUM CHLORIDE 0.9 % IJ SOLN
INTRAMUSCULAR | Status: AC
  Filled 2013-05-01: qty 10

## 2013-05-01 MED ORDER — PIPERACILLIN-TAZOBACTAM 3.375 G IVPB
3.3750 g | Freq: Three times a day (TID) | INTRAVENOUS | Status: DC
  Administered 2013-05-01 – 2013-05-04 (×8): 3.375 g via INTRAVENOUS
  Filled 2013-05-01 (×10): qty 50

## 2013-05-01 MED ORDER — HYDROCODONE-ACETAMINOPHEN 7.5-325 MG PO TABS: 1.0000 | ORAL_TABLET | Freq: Four times a day (QID) | ORAL | Status: DC | PRN

## 2013-05-01 MED ORDER — PIPERACILLIN SOD-TAZOBACTAM SO 2.25 (2-0.25) G IV SOLR
3.3750 g | Freq: Once | INTRAVENOUS | Status: DC
  Filled 2013-05-01: qty 3.38

## 2013-05-01 MED ORDER — ATORVASTATIN CALCIUM 40 MG PO TABS
40.0000 mg | ORAL_TABLET | Freq: Every day | ORAL | Status: DC
  Administered 2013-05-01 – 2013-05-03 (×3): 40 mg via ORAL
  Filled 2013-05-01 (×4): qty 1

## 2013-05-01 MED ORDER — MORPHINE SULFATE ER 15 MG PO TBCR
15.0000 mg | EXTENDED_RELEASE_TABLET | Freq: Two times a day (BID) | ORAL | Status: DC
  Administered 2013-05-01 – 2013-05-04 (×6): 15 mg via ORAL
  Filled 2013-05-01 (×6): qty 1

## 2013-05-01 MED ORDER — ACETAMINOPHEN 650 MG RE SUPP: 650.0000 mg | Freq: Four times a day (QID) | RECTAL | Status: DC | PRN

## 2013-05-01 MED ORDER — CLOPIDOGREL BISULFATE 75 MG PO TABS
75.0000 mg | ORAL_TABLET | Freq: Every day | ORAL | Status: DC
  Administered 2013-05-02 – 2013-05-04 (×3): 75 mg via ORAL
  Filled 2013-05-01 (×4): qty 1

## 2013-05-01 MED ORDER — LIDOCAINE-PRILOCAINE 2.5-2.5 % EX CREA: 1.0000 "application " | TOPICAL_CREAM | CUTANEOUS | Status: DC | PRN

## 2013-05-01 MED ORDER — VANCOMYCIN HCL 10 G IV SOLR
1250.0000 mg | Freq: Two times a day (BID) | INTRAVENOUS | Status: DC
  Administered 2013-05-02 – 2013-05-04 (×5): 1250 mg via INTRAVENOUS
  Filled 2013-05-01 (×6): qty 1250

## 2013-05-01 MED ORDER — ACETAMINOPHEN 325 MG PO TABS: 650.0000 mg | ORAL_TABLET | Freq: Four times a day (QID) | ORAL | Status: DC | PRN

## 2013-05-01 MED ORDER — DICYCLOMINE HCL 20 MG PO TABS
20.0000 mg | ORAL_TABLET | Freq: Two times a day (BID) | ORAL | Status: DC
  Administered 2013-05-01 – 2013-05-04 (×6): 20 mg via ORAL
  Filled 2013-05-01 (×7): qty 1

## 2013-05-01 MED ORDER — ENOXAPARIN SODIUM 40 MG/0.4ML ~~LOC~~ SOLN
40.0000 mg | SUBCUTANEOUS | Status: DC
  Administered 2013-05-01 – 2013-05-03 (×3): 40 mg via SUBCUTANEOUS
  Filled 2013-05-01 (×4): qty 0.4

## 2013-05-01 MED ORDER — ZOLPIDEM TARTRATE 5 MG PO TABS
5.0000 mg | ORAL_TABLET | Freq: Every evening | ORAL | Status: DC | PRN
  Administered 2013-05-02: 5 mg via ORAL
  Filled 2013-05-01 (×2): qty 1

## 2013-05-01 MED ORDER — IOHEXOL 350 MG/ML SOLN
100.0000 mL | Freq: Once | INTRAVENOUS | Status: AC | PRN
  Administered 2013-05-01: 100 mL via INTRAVENOUS

## 2013-05-01 MED ORDER — POTASSIUM CHLORIDE CRYS ER 20 MEQ PO TBCR
20.0000 meq | EXTENDED_RELEASE_TABLET | Freq: Once | ORAL | Status: AC
  Administered 2013-05-02: 20 meq via ORAL
  Filled 2013-05-01 (×2): qty 1

## 2013-05-01 MED ORDER — POTASSIUM CHLORIDE CRYS ER 10 MEQ PO TBCR
10.0000 meq | EXTENDED_RELEASE_TABLET | Freq: Two times a day (BID) | ORAL | Status: DC
  Administered 2013-05-01 – 2013-05-04 (×6): 10 meq via ORAL
  Filled 2013-05-01 (×7): qty 1

## 2013-05-01 MED ORDER — ALPRAZOLAM 0.5 MG PO TABS
0.5000 mg | ORAL_TABLET | Freq: Three times a day (TID) | ORAL | Status: DC | PRN
  Administered 2013-05-02: 0.5 mg via ORAL
  Filled 2013-05-01: qty 1

## 2013-05-01 NOTE — ED Notes (Signed)
Erin PA at bedside 

## 2013-05-01 NOTE — H&P (Signed)
[facility] History and Physical  [name] AOZ:[id number] DOB: 1951-01-02 DOA: 05/01/2013   PCP: [name], R, [name]   Chief Complaint: Shortness of breath and chest discomfort  HPI:  62 year old female with a history of multiple myeloma status post autologous peripheral stem cell transplant in 2009, chemotherapy with Relimid, Velcade and most recently switched to Carfilzomib in addition to palliative radiotherapy presents with a one-week history of progressive shortness of breath. The patient also has a history of hyperlipidemia, left frontal stroke, and glucose intolerance. The patient was recently discharged from the hospital on 03/24/2013 after suffering a left frontal ischemic stroke.  Most recently, the patient received chemotherapy on Wednesday, 04/28/2013. The patient has also been taking dexamethasone 40 mg once per week. Her last dose was on 04/28/2013.  The patient complains of subjective fevers and chills with temperature up to 99.34F at home. She has a mostly nonproductive cough without any hemoptysis. She states that her shortness of breath is mostly with exertion, but she feels like she has to strain to speak. She has developed hoarseness within the past week. She denies any PND or orthopnea. She has some nausea but denies any vomiting, diarrhea, abdominal pain, dysuria. She went to see her primary care provider this week whom ordered a venous duplex on her lower extremities. It was negative--0/29/2014. The patient was also been complaining of some chest discomfort primarily with exertion. She denies any dizziness or syncope or focal extremity weakness.  In the ED, the patient was noted to be hemodynamically stable and afebrile. BMP showed a potassium of 3.2. WBC was 14.5. Urinalysis did not show any pyuria. ProBNP was 130. Troponin was negative. CT angiogram of the chest negative for pulmonary embolus, but it showed right lower lobe patchy density as well as right  middle lobe nodular density.  Assessment/Plan: healthcare associated pneumonia  -Although the patient's dexamethasone may be contributing to her leukocytosis the patient has been on similar doses of dexamethasone for the past year without any leukocytosis  -I requested that the ED ordered blood cultures x2 sets  -Empiric vancomycin and Zosyn pending culture data  -Pulmonary toilet  Multiple myeloma  -Med Onc has been notified of pt's admission  History of stroke -Continue Plavix  -Continue Lipitor  Impaired glucose tolerance  -02/24/2013 hemoglobin A1c 6.4  -NovoLog sliding scale  Chronic pain  -Continue MS Contin 15 mg twice a day as well as Norco when necessary  Hypothyroidism  -Continue Synthroid  Diastolic dysfunction  -04/16/2013 echocardiogram shows grade 1 diastolic dysfunction, EF 55-60%  -Continue furosemide, patient is euvolemic  Chronic loose stools -No recent antibiotics -?chemo -cdiff PCR Chest discomfort -cycle troponins     Past Medical History  Diagnosis Date  . Thyroid disease Hypothyroidism  . Hypercholesterolemia   . Compression fracture 04/08/2007    pathologic compression fracture  . Hypothyroidism   . FHx: chemotherapy     s/p 5 cycle revlimid/low dose decadron,s/p velcade,doxil,decadron,  . Hx of radiation therapy 05/05/07-05/18/07,& 03/05/11-03/21/11-    l3&l5 in 2008, t2-t6 03/2011  . GERD (gastroesophageal reflux disease)   . Insomnia     associated with steroids  . Nausea   . Constipation     takes oxycontin,vicodin  . Hx of radiation therapy 05/05/2007 to 05/18/2007    palliative, L3-5  . 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 Hx of radiation therapy 03/05/2011 to 03/21/2011    palliative T2-T6, c-spine  . History of autologous stem cell transplant 11/20/2007  [facility], Dr Vicente Serene  . PONV (postoperative nausea and vomiting)   . Metastasis to bone    Past Surgical History  Procedure Laterality Date  . Cholecystectomy    . Knee surgery    .  Knee surgery    . Video bronchoscopy  07/30/2011    Procedure: VIDEO BRONCHOSCOPY WITHOUT FLUORO;  Surgeon: Barbaraann Share, MD;  Location: [facility];  Service: Cardiopulmonary;  Laterality: Bilateral;   Social History:  reports that she quit smoking about 34 years ago. She has never used smokeless tobacco. She reports that she does not drink alcohol or use illicit drugs.   Family History  Problem Relation Age of Onset  . Cancer Brother     lung ca  . Cancer Maternal Uncle     colon  . Cancer Maternal Grandmother     breast ca     No Known Allergies    Prior to Admission medications   Medication Sig Start Date End Date Taking? Authorizing Provider  ALPRAZolam Prudy Feeler) 0.5 MG tablet Take 0.5 mg by mouth 3 (three) times daily as needed for anxiety.  05/06/12  Yes Historical Provider, MD  atorvastatin (LIPITOR) 40 MG tablet Take 1 tablet (40 mg total) by mouth daily. 02/25/13  Yes Vassie Loll, MD  clopidogrel (PLAVIX) 75 MG tablet Take 1 tablet (75 mg total) by mouth daily with breakfast. 02/25/13  Yes Vassie Loll, MD  dexamethasone (DECADRON) 4 MG tablet Take 40 mg by mouth once a week. Taken on Thursdays.   Yes Historical Provider, MD  dicyclomine (BENTYL) 20 MG tablet Take 20 mg by mouth 2 (two) times daily.  11/03/12  Yes Historical Provider, MD  ESZOPICLONE 3 MG tablet Take 3 mg by mouth at bedtime.  07/08/11  Yes Historical Provider, MD  furosemide (LASIX) 40 MG tablet Take 40 mg by mouth 2 (two) times daily. 01/14/13  Yes Lucky Cowboy, MD  gabapentin (NEURONTIN) 300 MG capsule Take 600 mg by mouth 4 (four) times daily.  01/12/13  Yes Lucky Cowboy, MD  hyaluronate sodium (RADIAPLEXRX) GEL Apply 1 application topically 2 (two) times daily. Apply to affected skin area after rad txs and bedtime 03/31/13  Yes Jonna Coup, MD  HYDROcodone-acetaminophen [facility]) 7.5-325 MG per tablet Take 1 tablet by mouth every 6 (six) hours as needed for pain. 04/09/13  Yes Adrena E Johnson, [name]   levothyroxine (SYNTHROID, LEVOTHROID) 100 MCG tablet Take 100 mcg by mouth daily.    Yes Amy Elisabeth Most, DO  lidocaine-prilocaine (EMLA) cream Apply 1 application topically as needed (for port-a-cath access).   Yes Historical Provider, MD  loperamide (IMODIUM) 2 MG capsule Take 2 mg by mouth 4 (four) times daily as needed for diarrhea or loose stools.   Yes Historical Provider, MD  metFORMIN (GLUCOPHAGE) 500 MG tablet Take 500 mg by mouth daily with breakfast.   Yes Historical Provider, MD  morphine (MS CONTIN) 15 MG 12 hr tablet Take 1 tablet (15 mg total) by mouth 2 (two) times daily. 04/05/13  Yes Si Gaul, MD  Multiple Vitamin (MULITIVITAMIN WITH MINERALS) TABS Take 1 tablet by mouth daily.   Yes Historical Provider, MD  ondansetron (ZOFRAN-ODT) 4 MG disintegrating tablet Take 1 tablet by mouth every 8 (eight) hours as needed for nausea.  11/30/12  Yes Historical Provider, MD  pantoprazole (PROTONIX) 40 MG tablet Take 40 mg by mouth daily.   Yes Historical Provider, MD  potassium chloride (K-DUR,KLOR-CON) 10 MEQ tablet Take 10 mEq by mouth 2 (two) times  daily. 01/19/13  Yes Berenice Primas, [name]  PRESCRIPTION MEDICATION Inject into the vein every 7 (seven) days. Velcade infusion every Thursday.   Yes Historical Provider, MD    Review of Systems:  Constitutional:  No weight loss, night sweats, Head&Eyes: No headache.  No vision loss.  No eye pain or scotoma ENT:  No Difficulty swallowing,Tooth/dental problems,Sore throat,   Cardio-vascular:  No c Orthopnea, PND, dizziness, palpitations  GI:  No  abdominal pain, nausea, vomiting, diarrhea, loss of appetite, hematochezia, melena,  Resp:   No coughing up of blood .No wheezing.No chest wall deformity  Skin:  no rash or lesions.  GU:  no dysuria, change in color of urine, no urgency or frequency. No flank pain.  Musculoskeletal:  No joint pain or swelling. No decreased range of motion. No back pain.  Psych:  No change in mood or  affect.  Neurologic: No headache, no dysesthesia, no focal weakness, no vision loss. No syncope  Physical Exam: Filed Vitals:   05/01/13 1153 05/01/13 1348 05/01/13 1408 05/01/13 1524  BP: 116/73  104/57 106/61  Pulse: 104  91 91  Temp: 98.6 F (37 C)     TempSrc: Oral     Resp: 24 22 18 18   SpO2: 96% 99% 100% 100%   General:  A&O x 3, NAD, nontoxic, pleasant/cooperative Head/Eye: No conjunctival hemorrhage, no icterus, [place]/AT, No nystagmus ENT:  No icterus,  No thrush, good dentition, no pharyngeal exudate Neck:  No masses, no lymphadenpathy, no bruits CV:  RRR, no rub, no gallop, no S3, no JVD Lung:  Right lung with crackles. Left clear to auscultation. No wheezing. Abdomen: soft/NT, +BS, nondistended, no peritoneal signs Ext: No cyanosis, No rashes, No petechiae, No lymphangitis, 1+LE edema   Labs on Admission:  Basic Metabolic Panel:  Recent Labs Lab 04/27/13 1529 05/01/13 1220  NA 141 138  K 3.5 3.2*  CL  --  101  CO2 24 27  GLUCOSE 110 100*  BUN 7.8 11  CREATININE 0.8 0.82  CALCIUM 8.8 8.8   Liver Function Tests:  Recent Labs Lab 04/27/13 1529  AST 15  ALT 16  ALKPHOS 106  BILITOT 1.00  PROT 6.5  ALBUMIN 2.7*   No results found for this basename: LIPASE, AMYLASE,  in the last 168 hours No results found for this basename: AMMONIA,  in the last 168 hours CBC:  Recent Labs Lab 04/27/13 1528 05/01/13 1220  WBC 5.9 14.5*  NEUTROABS 3.9  --   HGB 9.4* 11.0*  HCT 28.0* 32.6*  MCV 95.7 96.2  PLT 214 199   Cardiac Enzymes:  Recent Labs Lab 05/01/13 1220  TROPONINI <0.30   BNP: No components found with this basename: POCBNP,  CBG: No results found for this basename: GLUCAP,  in the last 168 hours  Radiological Exams on Admission: Dg Chest 2 View  05/01/2013   CLINICAL DATA:  Shortness of breath. Cough. Chest pain. Multiple myeloma.  EXAM: CHEST  2 VIEW  COMPARISON:  04/26/2013  FINDINGS: Heart size is normal. Both lungs are clear. No  evidence of pleural effusion. No mass or lymphadenopathy identified. Right-sided power port remains in appropriate position.  IMPRESSION: Stable exam. No active disease.   Electronically Signed   By: Myles Rosenthal M.D.   On: 05/01/2013 12:57   Ct Angio Chest Pe W/cm &/or Wo Cm  05/01/2013   CLINICAL DATA:  Shortness of breath.  History of multiple myeloma.  EXAM: CT ANGIOGRAPHY CHEST WITH CONTRAST  TECHNIQUE: Multidetector CT imaging of the chest was performed using the standard protocol during bolus administration of intravenous contrast. Multiplanar CT image reconstructions including MIPs were obtained to evaluate the vascular anatomy.  CONTRAST:  OMNIPAQUE IOHEXOL 350 MG/ML SOLN  COMPARISON:  05/31/2012  FINDINGS: Negative for acute pulmonary embolism. Port-A-Cath tip is near the cavoatrial junction. No significant pericardial or pleural fluid. No significant chest lymphadenopathy. Heart size is normal. The images of the upper abdomen are unremarkable.  The trachea and mainstem bronchi are patent. There are subtle patchy densities in the superior segment of the right lower lobe. Chronic changes along the medial aspect of the right upper lobe. Few subtle densities in the lateral right lung apex. Punctate nodular density in the right middle lobe on sequence 7, image 54 and subtle densities in posterior right upper lobe on image 34. No large areas of consolidation or airspace disease. Subtle hazy densities in the medial left lower lobe. Again noted are scattered subtle lucent lesions in the bones. Findings are compatible with history of multiple myeloma.  Review of the MIP images confirms the above findings.  IMPRESSION: Negative for pulmonary embolism.  There are subtle parenchymal densities in the lungs, particularly in the right lung. Findings are suggestive for an infectious or inflammatory process of unknown age.  Lucent bone lesions consistent with history of myeloma.   Electronically Signed   By: Richarda Overlie M.D.   On: 05/01/2013 14:20    EKG: Independently reviewed. Sinus tachycardia, no ST-T wave changes    Time spent:70 minutes Code Status:   FULL Family Communication:   Family at bedside   Mataya Kilduff, DO  [facility] Pager [phone]  If 7PM-7AM, please contact night-coverage [email or web address] Password [email or web address] 05/01/2013, 4:06 PM

## 2013-05-01 NOTE — ED Notes (Signed)
Pt states she feels short of breath feels the oxygen is not  Working. NT John obtain an O2 sat of 99% on 2L. Breath sounds clear bilaterally. Erin PA aware.

## 2013-05-01 NOTE — ED Notes (Addendum)
Pt c/o intermittent SOB x "a couple months" and sore throat and congestion x 2 days.  Pain score 7/10.  Pt sts she was seen by her PCP x 4 days ago and told to come to ED, if she started getting short of breath.  Hx of multiple myeloma.

## 2013-05-01 NOTE — ED Notes (Signed)
Pt transported to CT ?

## 2013-05-01 NOTE — ED Notes (Signed)
Pt's O2 sat is fluctuating between 88-96%.  2L [place] applied.

## 2013-05-01 NOTE — ED Notes (Signed)
Pt returned from CT °

## 2013-05-01 NOTE — ED Notes (Addendum)
Attempted to call report. Dakina RN from 3E will call back.

## 2013-05-01 NOTE — Progress Notes (Signed)
ANTIBIOTIC CONSULT NOTE - INITIAL  Pharmacy Consult for Vancomycin Indication: pneumonia  No Known Allergies  Patient Measurements: Height: 5\' 6"  (167.6 cm) Weight: 196 lb 4.8 oz (89.041 kg) IBW/kg (Calculated) : 59.3   Vital Signs: Temp: 98 F (36.7 C) (11/01 1817) Temp src: Oral (11/01 1817) BP: 116/62 mmHg (11/01 1817) Pulse Rate: 100 (11/01 1817) Intake/Output from previous day:   Intake/Output from this shift:    Labs:  Recent Labs  05/01/13 1220  WBC 14.5*  HGB 11.0*  PLT 199  CREATININE 0.82   Estimated Creatinine Clearance: 81 ml/min (by C-G formula based on Cr of 0.82). No results found for this basename: VANCOTROUGH, VANCOPEAK, VANCORANDOM, GENTTROUGH, GENTPEAK, GENTRANDOM, TOBRATROUGH, TOBRAPEAK, TOBRARND, AMIKACINPEAK, AMIKACINTROU, AMIKACIN,  in the last 72 hours   Microbiology: No results found for this or any previous visit (from the past 720 hour(s)).  Medical History: Past Medical History  Diagnosis Date  . Thyroid disease Hypothyroidism  . Hypercholesterolemia   . Compression fracture 04/08/2007    pathologic compression fracture  . Hypothyroidism   . FHx: chemotherapy     s/p 5 cycle revlimid/low dose decadron,s/p velcade,doxil,decadron,  . Hx of radiation therapy 05/05/07-05/18/07,& 03/05/11-03/21/11-    l3&l5 in 2008, t2-t6 03/2011  . GERD (gastroesophageal reflux disease)   . Insomnia     associated with steroids  . Nausea   . Constipation     takes oxycontin,vicodin  . Hx of radiation therapy 05/05/2007 to 05/18/2007    palliative, L3-5  . 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 Hx of radiation therapy 03/05/2011 to 03/21/2011    palliative T2-T6, c-spine  . 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 PONV (postoperative nausea and vomiting)   . Metastasis to bone     Medications:  Scheduled:  . atorvastatin  40 mg Oral Daily  . [START ON 05/02/2013] clopidogrel  75 mg Oral Q breakfast  . dicyclomine   20 mg Oral BID  . enoxaparin (LOVENOX) injection  40 mg Subcutaneous Q24H  . furosemide  40 mg Oral BID  . gabapentin  600 mg Oral QID  . [START ON 05/02/2013] insulin aspart  0-9 Units Subcutaneous TID WC  . [START ON 05/02/2013] levothyroxine  100 mcg Oral Daily  . morphine  15 mg Oral BID  . pantoprazole  40 mg Oral Daily  . piperacillin-tazobactam (ZOSYN)  IV  3.375 g Intravenous Once  . piperacillin-tazobactam  3.375 g Intravenous Q8H  . potassium chloride  10 mEq Oral BID  . potassium chloride  20 mEq Oral Once  . sodium chloride      . [START ON 05/02/2013] vancomycin  1,250 mg Intravenous Q12H   Infusions:   Assessment: 62 yo with hx of multiple myeloma s/p chemo 10/29 presents with one-week hx of SOB.  Vancomycin and Zosyn for PNA.  Goal of Therapy:  Vancomycin trough level 15-20 mcg/ml  Plan:   Vancomycin 1gm x1 in ER then 1250mg  IV q12h.  CrCl~83 (N)  F/U Scr/levels as needed  [name] 05/01/2013,8:28 PM

## 2013-05-01 NOTE — ED Notes (Signed)
PA at bedside.

## 2013-05-01 NOTE — ED Provider Notes (Signed)
CSN: [id number]     Arrival date & time 05/01/13  1141 History   First MD Initiated Contact with Patient 05/01/13 1155     Chief Complaint  Patient presents with  . Shortness of Breath   (Consider location/radiation/quality/duration/timing/severity/associated sxs/prior Treatment) HPI Pt is a 62yo female with hx of multiple myeloma being tx with chemotherapy presenting today c/o SOB, worse in the last 4 weeks. Also c/o sore throat with a cough and congestion x2 days, coughing up "green-brown" sputum.  Reports throat pain is worse with her cough, 7/10 and makes reports change in her voice.  Sometimes she tries to speak and nothing comes out, not due to pain though.  Reports severely dry mouth over the last few days.  Does state she was seen by her PCP 2 weeks ago and placed on Leqvaquin but taken off and stated she didn't feel any different.  Also reports seeing PCP 4 days ago and was told to come to ED if she started to feel SOB.  Denies fever, n/v/d. Denies sick contacts or recent travel.   Past Medical History  Diagnosis Date  . Thyroid disease Hypothyroidism  . Hypercholesterolemia   . Compression fracture 04/08/2007    pathologic compression fracture  . Hypothyroidism   . FHx: chemotherapy     s/p 5 cycle revlimid/low dose decadron,s/p velcade,doxil,decadron,  . Hx of radiation therapy 05/05/07-05/18/07,& 03/05/11-03/21/11-    l3&l5 in 2008, t2-t6 03/2011  . GERD (gastroesophageal reflux disease)   . Insomnia     associated with steroids  . Nausea   . Constipation     takes oxycontin,vicodin  . Hx of radiation therapy 05/05/2007 to 05/18/2007    palliative, L3-5  . 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 Hx of radiation therapy 03/05/2011 to 03/21/2011    palliative T2-T6, c-spine  . 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 PONV (postoperative nausea and vomiting)   . Metastasis to bone    Past Surgical History  Procedure Laterality Date  .  Cholecystectomy    . Knee surgery    . Knee surgery    . Video bronchoscopy  07/30/2011    Procedure: VIDEO BRONCHOSCOPY WITHOUT FLUORO;  Surgeon: Barbaraann Share, MD;  Location: [facility];  Service: Cardiopulmonary;  Laterality: Bilateral;   Family History  Problem Relation Age of Onset  . Cancer Brother     lung ca  . Cancer Maternal Uncle     colon  . Cancer Maternal Grandmother     breast ca   History  Substance Use Topics  . Smoking status: Former Smoker -- 0.25 packs/day for 6 years    Quit date: 08/27/1978  . Smokeless tobacco: Never Used  . Alcohol Use: No   OB History   Grav Para Term Preterm Abortions TAB SAB Ect Mult Living                 Review of Systems  Constitutional: Positive for fatigue. Negative for fever and chills.  HENT: Positive for sore throat and voice change. Negative for trouble swallowing.   Respiratory: Positive for cough and shortness of breath. Negative for choking, chest tightness, wheezing and stridor.   Cardiovascular: Negative for chest pain, palpitations and leg swelling.    Allergies  Review of patient's allergies indicates no known allergies.  Home Medications   Current Outpatient Rx  Name  Route  Sig  Dispense  Refill  . ALPRAZolam Prudy Feeler)  0.5 MG tablet   Oral   Take 0.5 mg by mouth 3 (three) times daily as needed for anxiety.          Marland Kitchen atorvastatin (LIPITOR) 40 MG tablet   Oral   Take 1 tablet (40 mg total) by mouth daily.   30 tablet   1   . clopidogrel (PLAVIX) 75 MG tablet   Oral   Take 1 tablet (75 mg total) by mouth daily with breakfast.   30 tablet   1   . dexamethasone (DECADRON) 4 MG tablet   Oral   Take 40 mg by mouth once a week. Taken on Thursdays.         Marland Kitchen dicyclomine (BENTYL) 20 MG tablet   Oral   Take 20 mg by mouth 2 (two) times daily.          Marland Kitchen ESZOPICLONE 3 MG tablet   Oral   Take 3 mg by mouth at bedtime.          . furosemide (LASIX) 40 MG tablet   Oral   Take 40 mg by mouth  2 (two) times daily.         Marland Kitchen gabapentin (NEURONTIN) 300 MG capsule   Oral   Take 600 mg by mouth 4 (four) times daily.          . hyaluronate sodium (RADIAPLEXRX) GEL   Topical   Apply 1 application topically 2 (two) times daily. Apply to affected skin area after rad txs and bedtime         . HYDROcodone-acetaminophen (NORCO) 7.5-325 MG per tablet   Oral   Take 1 tablet by mouth every 6 (six) hours as needed for pain.   30 tablet   0   . levothyroxine (SYNTHROID, LEVOTHROID) 100 MCG tablet   Oral   Take 100 mcg by mouth daily.          Marland Kitchen lidocaine-prilocaine (EMLA) cream   Topical   Apply 1 application topically as needed (for port-a-cath access).         Marland Kitchen loperamide (IMODIUM) 2 MG capsule   Oral   Take 2 mg by mouth 4 (four) times daily as needed for diarrhea or loose stools.         . metFORMIN (GLUCOPHAGE) 500 MG tablet   Oral   Take 500 mg by mouth daily with breakfast.         . morphine (MS CONTIN) 15 MG 12 hr tablet   Oral   Take 1 tablet (15 mg total) by mouth 2 (two) times daily.   60 tablet   0   . Multiple Vitamin (MULITIVITAMIN WITH MINERALS) TABS   Oral   Take 1 tablet by mouth daily.         . ondansetron (ZOFRAN-ODT) 4 MG disintegrating tablet   Oral   Take 1 tablet by mouth every 8 (eight) hours as needed for nausea.          . pantoprazole (PROTONIX) 40 MG tablet   Oral   Take 40 mg by mouth daily.         . potassium chloride (K-DUR,KLOR-CON) 10 MEQ tablet   Oral   Take 10 mEq by mouth 2 (two) times daily.         Marland Kitchen PRESCRIPTION MEDICATION   Intravenous   Inject into the vein every 7 (seven) days. Velcade infusion every Thursday.          BP 106/61  Pulse  91  Temp(Src) 98.6 F (37 C) (Oral)  Resp 18  SpO2 100% Physical Exam  Nursing note and vitals reviewed. Constitutional: She appears well-developed and well-nourished.  Obese female lying in exam bed. Mild dyspnea.  HENT:  Head: Normocephalic and  atraumatic.  Mouth/Throat: Uvula is midline. Mucous membranes are dry. No posterior oropharyngeal edema.  Eyes: Conjunctivae are normal. No scleral icterus.  Neck: Normal range of motion.  Cardiovascular: Normal rate, regular rhythm and normal heart sounds.   Pulmonary/Chest: Effort normal and breath sounds normal. No respiratory distress. She has no wheezes. She has no rales. She exhibits no tenderness.  Pt speaking in broken sentences.  Difficult to tell if due to severely dry mouth or because SOB.  Abdominal: Soft. Bowel sounds are normal. She exhibits no distension and no mass. There is no tenderness. There is no rebound and no guarding.  Musculoskeletal: Normal range of motion.  Neurological: She is alert.  Skin: Skin is warm and dry.    ED Course  Procedures (including critical care time) Labs Review Labs Reviewed  CBC - Abnormal; Notable for the following:    WBC 14.5 (*)    RBC 3.39 (*)    Hemoglobin 11.0 (*)    HCT 32.6 (*)    All other components within normal limits  BASIC METABOLIC PANEL - Abnormal; Notable for the following:    Potassium 3.2 (*)    Glucose, Bld 100 (*)    GFR calc non Af Amer 76 (*)    GFR calc Af Amer 88 (*)    All other components within normal limits  PRO B NATRIURETIC PEPTIDE - Abnormal; Notable for the following:    Pro B Natriuretic peptide (BNP) 130.0 (*)    All other components within normal limits  CULTURE, BLOOD (ROUTINE X 2)  CULTURE, BLOOD (ROUTINE X 2)  TROPONIN I  URINALYSIS, ROUTINE W REFLEX MICROSCOPIC   Imaging Review Dg Chest 2 View  05/01/2013   CLINICAL DATA:  Shortness of breath. Cough. Chest pain. Multiple myeloma.  EXAM: CHEST  2 VIEW  COMPARISON:  04/26/2013  FINDINGS: Heart size is normal. Both lungs are clear. No evidence of pleural effusion. No mass or lymphadenopathy identified. Right-sided power port remains in appropriate position.  IMPRESSION: Stable exam. No active disease.   Electronically Signed   By: Myles Rosenthal  M.D.   On: 05/01/2013 12:57   Ct Angio Chest Pe W/cm &/or Wo Cm  05/01/2013   CLINICAL DATA:  Shortness of breath.  History of multiple myeloma.  EXAM: CT ANGIOGRAPHY CHEST WITH CONTRAST  TECHNIQUE: Multidetector CT imaging of the chest was performed using the standard protocol during bolus administration of intravenous contrast. Multiplanar CT image reconstructions including MIPs were obtained to evaluate the vascular anatomy.  CONTRAST:  OMNIPAQUE IOHEXOL 350 MG/ML SOLN  COMPARISON:  05/31/2012  FINDINGS: Negative for acute pulmonary embolism. Port-A-Cath tip is near the cavoatrial junction. No significant pericardial or pleural fluid. No significant chest lymphadenopathy. Heart size is normal. The images of the upper abdomen are unremarkable.  The trachea and mainstem bronchi are patent. There are subtle patchy densities in the superior segment of the right lower lobe. Chronic changes along the medial aspect of the right upper lobe. Few subtle densities in the lateral right lung apex. Punctate nodular density in the right middle lobe on sequence 7, image 54 and subtle densities in posterior right upper lobe on image 34. No large areas of consolidation or airspace disease. Subtle hazy  densities in the medial left lower lobe. Again noted are scattered subtle lucent lesions in the bones. Findings are compatible with history of multiple myeloma.  Review of the MIP images confirms the above findings.  IMPRESSION: Negative for pulmonary embolism.  There are subtle parenchymal densities in the lungs, particularly in the right lung. Findings are suggestive for an infectious or inflammatory process of unknown age.  Lucent bone lesions consistent with history of myeloma.   Electronically Signed   By: Richarda Overlie M.D.   On: 05/01/2013 14:20    EKG Interpretation   None       Date: 05/01/2013  Rate: 104  Rhythm: normal sinus rhythm  QRS Axis: normal  Intervals: normal  ST/T Wave abnormalities: normal   Conduction Disutrbances:none  Narrative Interpretation: Sinus tachycardia, otherwise normal.  Old EKG Reviewed: changes noted and tachycardia     MDM   1. Dyspnea   2. Multiple myeloma   3. Pneumonia    Pneumonia, PE, CHF, chemotherapy side effect. Pt placed on nasal canula 2L, O2 stats-96-99%.   CBC: elevated WBC-14.5 (from 5.9 just 4 days ago, 10/28) UA: negative CXR: stab;e exam, no active disease  Pt still dyspneic, even after being placed on nasal canula. Pt reports severe SOB with minimal activity. Discussed pt with Dr. Denton Lank, pt was last admitted in August.  Due to continued dyspnea in ED, will call hospitalist to admit for suspected pneumonia seen on CT scan.  Tx started in ED: vancomycin and zosyn.   Consulted with Dr. Onalee Hua Tat, hospitalist.  Requested blood cultures and agreed to come see pt.   Pt will be admitted to med-surg.    Junius Finner, [name] 05/01/13 [phone]

## 2013-05-02 DIAGNOSIS — I5189 Other ill-defined heart diseases: Secondary | ICD-10-CM | POA: Diagnosis present

## 2013-05-02 DIAGNOSIS — J029 Acute pharyngitis, unspecified: Secondary | ICD-10-CM

## 2013-05-02 DIAGNOSIS — R0609 Other forms of dyspnea: Secondary | ICD-10-CM

## 2013-05-02 DIAGNOSIS — Z8673 Personal history of transient ischemic attack (TIA), and cerebral infarction without residual deficits: Secondary | ICD-10-CM

## 2013-05-02 DIAGNOSIS — G609 Hereditary and idiopathic neuropathy, unspecified: Secondary | ICD-10-CM

## 2013-05-02 DIAGNOSIS — R7302 Impaired glucose tolerance (oral): Secondary | ICD-10-CM | POA: Diagnosis present

## 2013-05-02 DIAGNOSIS — R0989 Other specified symptoms and signs involving the circulatory and respiratory systems: Secondary | ICD-10-CM

## 2013-05-02 DIAGNOSIS — C9002 Multiple myeloma in relapse: Secondary | ICD-10-CM

## 2013-05-02 DIAGNOSIS — D649 Anemia, unspecified: Secondary | ICD-10-CM | POA: Diagnosis present

## 2013-05-02 LAB — GLUCOSE, CAPILLARY
Glucose-Capillary: 105 mg/dL — ABNORMAL HIGH (ref 70–99)
Glucose-Capillary: 116 mg/dL — ABNORMAL HIGH (ref 70–99)
Glucose-Capillary: 109 mg/dL — ABNORMAL HIGH (ref 70–99)

## 2013-05-02 LAB — BASIC METABOLIC PANEL
CO2: 27 mEq/L (ref 19–32)
Calcium: 8.5 mg/dL (ref 8.4–10.5)
Chloride: 100 mEq/L (ref 96–112)
Creatinine, Ser: 0.8 mg/dL (ref 0.50–1.10)
GFR calc Af Amer: >90 mL/min (ref >90)
Glucose, Bld: 110 mg/dL — ABNORMAL HIGH (ref 70–99)
Sodium: 135 mEq/L (ref 135–145)

## 2013-05-02 LAB — CBC
MCH: 31.6 pg (ref 26.0–34.0)
MCV: 95.1 fL (ref 78.0–100.0)
Platelets: 190 10*3/uL (ref 150–400)
RBC: 3.04 MIL/uL — ABNORMAL LOW (ref 3.87–5.11)

## 2013-05-02 MED ORDER — GUAIFENESIN ER 600 MG PO TB12
600.0000 mg | ORAL_TABLET | Freq: Two times a day (BID) | ORAL | Status: DC
  Administered 2013-05-02 – 2013-05-04 (×5): 600 mg via ORAL
  Filled 2013-05-02 (×6): qty 1

## 2013-05-02 NOTE — Progress Notes (Signed)
DIAGNOSIS: Recurrent multiple myeloma initially diagnosed in October 2008.   PRIOR THERAPY:  1. Status post palliative radiotherapy to the lumbar spine between L3 and L5. The patient received a total dose of 3000 cGy in 10 fractions under the care of Dr. Mitzi Hansen between May 05, 2007 through May 18, 2007. 2. Status post 5 cycles of systemic chemotherapy with Revlimid and low-dose Decadron with good response to this treatment. 3. Status post autologous peripheral blood stem cell transplant at [facility] on Nov 20, 2007 under the care of Dr. Vicente Serene. 4. Status post treatment for disease recurrence with Velcade, Doxil and Decadron. Last dose given Nov 09, 2009. Discontinued secondary to intolerance but the patient had a good response to treatment at that time. 5. Status post palliative radiotherapy to the T2-T6 thoracic vertebrae completed 03/21/2011 under the care of Dr. Mitzi Hansen. 6. Systemic chemotherapy with Velcade 1.3 mg per meter squared given on days 1, 4, 8 and 11, and Doxil at 30 mg per meter squared given on day 4 in addition to Decadron status post 4 cycles, discontinued secondary to intolerance. 7. Systemic therapy with Velcade 1.3 mg/M2 subcutaneously in addition to Decadron 40 mg by mouth on a weekly basis, status post 20 cycles. The patient had good response with this treatment but it is discontinue today secondary to worsening peripheral neuropathy. 8. Palliative radiotherapy to the skull lesion as well as the left hip area under the care of Dr. Mitzi Hansen  CURRENT THERAPY:  1. Systemic chemotherapy with Carfilzomib 20 mg/M2 on days 1, 2, 8, 9, 13 and 16 every 4 weeks in addition to weekly Decadron 40 mg by mouth. First dose on 04/19/2013 2. Zometa 4 mg IV given every 3 months  Subjective: The patient is seen and examined today. Her daughter was at the bedside. She is a little bit better but continues to have hoarseness of her voice and sore throat as well as chest congestion. She  was admitted to [facility] yesterday with shortness of breath and chest discomfort. CT angiogram of the chest was negative for pulmonary embolism but there are subtle parenchymal densities in the lungs particularly in the right lung suggestive of an infection or inflammatory process of unknown age. It also showed some of the Lucent bone lesions consistent with her history of multiple myeloma. The patient was started on treatment with Zosyn and vancomycin and she is tolerating them fairly well. She denied having any significant fever or chills. Objective: Vital signs in last 24 hours: Temp:  [98 F (36.7 C)-99.1 F (37.3 C)] 98.4 F (36.9 C) (11/02 0451) Pulse Rate:  [89-104] 89 (11/02 0451) Resp:  [16-24] 16 (11/02 0451) BP: (103-116)/(57-73) 103/66 mmHg (11/02 0451) SpO2:  [96 %-100 %] 100 % (11/02 0451) Weight:  [196 lb 4.8 oz (89.041 kg)] 196 lb 4.8 oz (89.041 kg) (11/01 1817)  Intake/Output from previous day: 11/01 0701 - 11/02 0700 In: -  Out: 900 [Urine:900] Intake/Output this shift:    General appearance: alert, cooperative, fatigued and no distress Resp: clear to auscultation bilaterally Cardio: regular rate and rhythm, S1, S2 normal, no murmur, click, rub or gallop GI: soft, non-tender; bowel sounds normal; no masses,  no organomegaly Extremities: extremities normal, atraumatic, no cyanosis or edema  Lab Results:   Recent Labs  05/01/13 1220 05/02/13 0210  WBC 14.5* 10.6*  HGB 11.0* 9.6*  HCT 32.6* 28.9*  PLT 199 190   BMET  Recent Labs  05/01/13 1220 05/02/13 0210  NA 138  135  K 3.2* 3.7  CL 101 100  CO2 27 27  GLUCOSE 100* 110*  BUN 11 10  CREATININE 0.82 0.80  CALCIUM 8.8 8.5    Studies/Results: Dg Chest 2 View  05/01/2013   CLINICAL DATA:  Shortness of breath. Cough. Chest pain. Multiple myeloma.  EXAM: CHEST  2 VIEW  COMPARISON:  04/26/2013  FINDINGS: Heart size is normal. Both lungs are clear. No evidence of pleural effusion. No mass or  lymphadenopathy identified. Right-sided power port remains in appropriate position.  IMPRESSION: Stable exam. No active disease.   Electronically Signed   By: Myles Rosenthal M.D.   On: 05/01/2013 12:57   Ct Angio Chest Pe W/cm &/or Wo Cm  05/01/2013   CLINICAL DATA:  Shortness of breath.  History of multiple myeloma.  EXAM: CT ANGIOGRAPHY CHEST WITH CONTRAST  TECHNIQUE: Multidetector CT imaging of the chest was performed using the standard protocol during bolus administration of intravenous contrast. Multiplanar CT image reconstructions including MIPs were obtained to evaluate the vascular anatomy.  CONTRAST:  OMNIPAQUE IOHEXOL 350 MG/ML SOLN  COMPARISON:  05/31/2012  FINDINGS: Negative for acute pulmonary embolism. Port-A-Cath tip is near the cavoatrial junction. No significant pericardial or pleural fluid. No significant chest lymphadenopathy. Heart size is normal. The images of the upper abdomen are unremarkable.  The trachea and mainstem bronchi are patent. There are subtle patchy densities in the superior segment of the right lower lobe. Chronic changes along the medial aspect of the right upper lobe. Few subtle densities in the lateral right lung apex. Punctate nodular density in the right middle lobe on sequence 7, image 54 and subtle densities in posterior right upper lobe on image 34. No large areas of consolidation or airspace disease. Subtle hazy densities in the medial left lower lobe. Again noted are scattered subtle lucent lesions in the bones. Findings are compatible with history of multiple myeloma.  Review of the MIP images confirms the above findings.  IMPRESSION: Negative for pulmonary embolism.  There are subtle parenchymal densities in the lungs, particularly in the right lung. Findings are suggestive for an infectious or inflammatory process of unknown age.  Lucent bone lesions consistent with history of myeloma.   Electronically Signed   By: Richarda Overlie M.D.   On: 05/01/2013 14:20     Medications: I have reviewed the patient's current medications.  Assessment/Plan: 1) progressive multiple myeloma: Currently on treatment with Carfilzomib and dexamethasone and tolerating her treatment fairly well. I would hold her dose that is scheduled for early next week to give the patient more time to recover from her recent hospitalization. 2) questionable pneumonia: Continue current IV antibiotics with Zosyn and vancomycin. 3) history of peripheral neuropathy: Continue gabapentin. 4) pain management: Continue MS Contin and Norco. Thank you for taking good care of [name]. I will continue to follow up the patient with you and assist in her management on as needed basis.  LOS: 1 day    [name] K. 05/02/2013

## 2013-05-02 NOTE — ED Provider Notes (Signed)
Medical screening examination/treatment/procedure(s) were conducted as a shared visit with non-physician practitioner(s) and myself.  I personally evaluated the patient during the encounter.  EKG Interpretation     Ventricular Rate:    PR Interval:    QRS Duration:   QT Interval:    QTC Calculation:   R Axis:     Text Interpretation:              Pt with multiple myeloma, recent chemo (new agent), presents w cough, sob, ?subj fever. Labs. Cxr. Persistent dyspnea. Ct. Admit.     Suzi Roots, MD 05/02/13 [phone]

## 2013-05-02 NOTE — Progress Notes (Addendum)
[facility] PROGRESS NOTE  [name] ZOX:[id number] DOB: [birth year] DOA: 05/01/2013 PCP: [name], R, [name]  Brief narrative: [name] is an 62 y.o. female with a PMH of recent left frontal ischemic stroke (discharged from the hospital 03/24/2013), multiple myeloma status post autologous peripheral stem cell transplant in 2009, chemotherapy with Relimid, Velcade and most recently switched to Carfilzomib in addition to palliative radiotherapy who was admitted on 05/01/2013 healthcare associated pneumonia.  Assessment/Plan:  Principal problem:  Healthcare associated pneumonia  -Although chest x-ray clear, CT scan of the chest shows subtle parenchymal densities in the right lung, consistent with pneumonia. She is chronically immunosuppressed from chemotherapy and treatment with dexamethasone. Given her recent hospitalization, she has been placed on empiric vancomycin and Zosyn pending culture data. -Followup blood cultures. -Continue pulmonary toilet. -Add Mucinex given thick purulent sputum. Active problems:  Multiple myeloma / history of autologous stem cell transplant  -Seen by Dr. Arbutus Ped 05/02/2013. Previous treatment extensively documented in his note. History of stroke / hyperlipidemia  -Continue Plavix.  -Continue Lipitor.  Impaired glucose tolerance  -02/24/2013 hemoglobin A1c 6.4. Treated with metformin, which is currently on hold. -Continue NovoLog sliding scale. CBGs 109-120. Chronic pain  -Continue MS Contin 15 mg twice a day as well as Norco when necessary.  Hypothyroidism  -Continue Synthroid.  Diastolic dysfunction  -04/16/2013 echocardiogram shows grade 1 diastolic dysfunction, EF 55-60%.  -Continue furosemide, patient is euvolemic.  Chronic loose stools  -No recent antibiotics. Patient reports her bowels have not moved in over 24 hours. We'll discontinue C. difficile PCR check and contact isolation. Chest discomfort  -Troponins cycled every 6  hours x3, negative. Normocytic anemia -Given history of myeloma, this is most likely anemia of chronic disease. Baseline hemoglobin 10/11 mg/dL.  Code Status: Full. Family Communication: Updated at bedside. Disposition Plan: Home when stable.  IV access:  Porta-Cath.  Medical Consultants:  Dr. Si Gaul, Oncology.  Other Consultants:  None.  Anti-infectives:  Vancomycin 05/01/2013--->  Zosyn 05/01/2013--->  HPI/Subjective: [name] has a cough productive of thick, purulent sputum. No diarrhea in over 24 hours. No active nausea or vomiting.  Objective: Filed Vitals:   05/01/13 1524 05/01/13 1817 05/01/13 2130 05/02/13 0451  BP: 106/61 116/62 109/61 103/66  Pulse: 91 100 91 89  Temp:  98 F (36.7 C) 99.1 F (37.3 C) 98.4 F (36.9 C)  TempSrc:  Oral Oral Oral  Resp: 18 18 20 16   Height:  5\' 6"  (1.676 m)    Weight:  89.041 kg (196 lb 4.8 oz)    SpO2: 100% 100% 100% 100%    Intake/Output Summary (Last 24 hours) at 05/02/13 0746 Last data filed at 05/02/13 0454  Gross per 24 hour  Intake      0 ml  Output    900 ml  Net   -900 ml    Exam: Gen:  NAD Cardiovascular:  RRR, No M/R/G Respiratory:  Lungs CTAB Gastrointestinal:  Abdomen soft, NT/ND, + BS Extremities:  No C/E/C  Data Reviewed: Basic Metabolic Panel:  Recent Labs Lab 04/27/13 1529 05/01/13 1220 05/01/13 2100 05/02/13 0210  NA 141 138  --  135  K 3.5 3.2*  --  3.7  CL  --  101  --  100  CO2 24 27  --  27  GLUCOSE 110 100*  --  110*  BUN 7.8 11  --  10  CREATININE 0.8 0.82  --  0.80  CALCIUM 8.8 8.8  --  8.5  MG  --   --  1.4*  --    GFR Estimated Creatinine Clearance: 83 ml/min (by C-G formula based on Cr of 0.8). Liver Function Tests:  Recent Labs Lab 04/27/13 1529  AST 15  ALT 16  ALKPHOS 106  BILITOT 1.00  PROT 6.5  ALBUMIN 2.7*   CBC:  Recent Labs Lab 04/27/13 1528 05/01/13 1220 05/02/13 0210  WBC 5.9 14.5* 10.6*  NEUTROABS 3.9  --   --   HGB 9.4*  11.0* 9.6*  HCT 28.0* 32.6* 28.9*  MCV 95.7 96.2 95.1  PLT 214 199 190   Cardiac Enzymes:  Recent Labs Lab 05/01/13 1220 05/01/13 2100 05/02/13 0210  TROPONINI <0.30 <0.30 <0.30   BNP (last 3 results)  Recent Labs  05/31/12 1600 05/01/13 1214  PROBNP 118.8 130.0*   CBG:  Recent Labs Lab 05/01/13 1844 05/01/13 2139  GLUCAP 109* 120*    Procedures and Diagnostic Studies: Dg Chest 2 View  05/01/2013   CLINICAL DATA:  Shortness of breath. Cough. Chest pain. Multiple myeloma.  EXAM: CHEST  2 VIEW  COMPARISON:  04/26/2013  FINDINGS: Heart size is normal. Both lungs are clear. No evidence of pleural effusion. No mass or lymphadenopathy identified. Right-sided power port remains in appropriate position.  IMPRESSION: Stable exam. No active disease.   Electronically Signed   By: Myles Rosenthal M.D.   On: 05/01/2013 12:57   Ct Angio Chest Pe W/cm &/or Wo Cm  05/01/2013   CLINICAL DATA:  Shortness of breath.  History of multiple myeloma.  EXAM: CT ANGIOGRAPHY CHEST WITH CONTRAST  TECHNIQUE: Multidetector CT imaging of the chest was performed using the standard protocol during bolus administration of intravenous contrast. Multiplanar CT image reconstructions including MIPs were obtained to evaluate the vascular anatomy.  CONTRAST:  OMNIPAQUE IOHEXOL 350 MG/ML SOLN  COMPARISON:  05/31/2012  FINDINGS: Negative for acute pulmonary embolism. Port-A-Cath tip is near the cavoatrial junction. No significant pericardial or pleural fluid. No significant chest lymphadenopathy. Heart size is normal. The images of the upper abdomen are unremarkable.  The trachea and mainstem bronchi are patent. There are subtle patchy densities in the superior segment of the right lower lobe. Chronic changes along the medial aspect of the right upper lobe. Few subtle densities in the lateral right lung apex. Punctate nodular density in the right middle lobe on sequence 7, image 54 and subtle densities in posterior  right upper lobe on image 34. No large areas of consolidation or airspace disease. Subtle hazy densities in the medial left lower lobe. Again noted are scattered subtle lucent lesions in the bones. Findings are compatible with history of multiple myeloma.  Review of the MIP images confirms the above findings.  IMPRESSION: Negative for pulmonary embolism.  There are subtle parenchymal densities in the lungs, particularly in the right lung. Findings are suggestive for an infectious or inflammatory process of unknown age.  Lucent bone lesions consistent with history of myeloma.   Electronically Signed   By: Richarda Overlie M.D.   On: 05/01/2013 14:20    Scheduled Meds: . atorvastatin  40 mg Oral Daily  . clopidogrel  75 mg Oral Q breakfast  . dicyclomine  20 mg Oral BID  . enoxaparin (LOVENOX) injection  40 mg Subcutaneous Q24H  . furosemide  40 mg Oral BID  . gabapentin  600 mg Oral QID  . insulin aspart  0-9 Units Subcutaneous TID WC  . levothyroxine  100 mcg Oral Daily  . morphine  15 mg Oral BID  . pantoprazole  40 mg Oral Daily  . piperacillin-tazobactam (ZOSYN)  IV  3.375 g Intravenous Once  . piperacillin-tazobactam  3.375 g Intravenous Q8H  . potassium chloride  10 mEq Oral BID  . vancomycin  1,250 mg Intravenous Q12H   Continuous Infusions:   Time spent: 35 minutes with > 50% of time discussing current diagnostic test results, clinical impression and plan of care.    LOS: 1 day   Arvie Bartholomew  [facility] Pager [phone].   *Please note that the hospitalists switch teams on Wednesdays. Please call the flow manager at [phone] if you are having difficulty reaching the hospitalist taking care of this patient as she can update you and provide the most up-to-date pager number of provider caring for the patient. If 8PM-8AM, please contact night-coverage at [email or web address], password [email or web address]  05/02/2013, 7:46 AM

## 2013-05-03 ENCOUNTER — Other Ambulatory Visit: Payer: Medicare Other | Admitting: Lab

## 2013-05-03 LAB — GLUCOSE, CAPILLARY
Glucose-Capillary: 81 mg/dL (ref 70–99)
Glucose-Capillary: 116 mg/dL — ABNORMAL HIGH (ref 70–99)

## 2013-05-03 NOTE — Progress Notes (Signed)
[facility] PROGRESS NOTE  [name]:[id number] DOB: 1951/06/15 DOA: 05/01/2013 PCP: [name], R, [name]  Brief narrative: [name] is an 62 y.o. female with a PMH of recent left frontal ischemic stroke (discharged from the hospital 03/24/2013), multiple myeloma status post autologous peripheral stem cell transplant in 2009, chemotherapy with Relimid, Velcade and most recently switched to Carfilzomib in addition to palliative radiotherapy who was admitted on 05/01/2013 healthcare associated pneumonia.  Assessment/Plan:  Principal problem:  Healthcare associated pneumonia  -Although chest x-ray clear, CT scan of the chest shows subtle parenchymal densities in the right lung, consistent with pneumonia. She is chronically immunosuppressed from chemotherapy and treatment with dexamethasone. Given her recent hospitalization, she has been placed on empiric vancomycin and Zosyn pending culture data. -Followup blood cultures. -Continue pulmonary toilet. -Add Mucinex given thick purulent sputum. Patient continues to improve. White blood cell count normalizing. Prison she is oxygen less. We'll check ambulatory pulse ox daily. Appreciate oncology help Active problems:  Multiple myeloma / history of autologous stem cell transplant  -Seen by Dr. Arbutus Ped 05/02/2013. Previous treatment extensively documented in his note. History of stroke / hyperlipidemia  -Continue Plavix.  -Continue Lipitor.  Impaired glucose tolerance  -02/24/2013 hemoglobin A1c 6.4. Treated with metformin, which is currently on hold. -Continue NovoLog sliding scale. CBGs 109-120. Chronic pain  -Continue MS Contin 15 mg twice a day as well as Norco when necessary. , Pain tolerated Hypothyroidism  -Continue Synthroid.  Diastolic dysfunction  -04/16/2013 echocardiogram shows grade 1 diastolic dysfunction, EF 55-60%.  -Continue furosemide, patient is euvolemic.  Chronic loose stools  -No recent  antibiotics. Patient reports her bowels have not moved in over 24 hours. We'll discontinue C. difficile PCR check and contact isolation. Chest discomfort  -Troponins cycled every 6 hours x3, negative. Normocytic anemia -Given history of myeloma, this is most likely anemia of chronic disease. Baseline hemoglobin 10/11 mg/dL.  Code Status: Full. Family Communication: Patient's daughter at the bedside Disposition Plan: Home when stable.  IV access:  Porta-Cath.  Medical Consultants:  Dr. Si Gaul, Oncology.  Other Consultants:  None.  Anti-infectives:  Vancomycin 05/01/2013--->  Zosyn 05/01/2013--->  HPI/Subjective: [name] has a cough productive of thick, purulent sputum. No diarrhea in over 24 hours. No active nausea or vomiting.  Objective: Filed Vitals:   05/02/13 0451 05/02/13 1427 05/02/13 2051 05/03/13 0502  BP: 103/66 105/61 118/71 101/69  Pulse: 89 93 97 88  Temp: 98.4 F (36.9 C) 99.3 F (37.4 C) 98.7 F (37.1 C) 98.5 F (36.9 C)  TempSrc: Oral Oral Oral Oral  Resp: 16 16 16 16   Height:      Weight:      SpO2: 100% 99% 100% 99%    Intake/Output Summary (Last 24 hours) at 05/03/13 0846 Last data filed at 05/02/13 1700  Gross per 24 hour  Intake    530 ml  Output    600 ml  Net    -70 ml    Exam: Gen:  NAD Cardiovascular:  RRR, No M/R/G Respiratory:  Lungs CTAB Gastrointestinal:  Abdomen soft, NT/ND, + BS Extremities:  No C/E/C  Data Reviewed: Basic Metabolic Panel:  Recent Labs Lab 04/27/13 1529 05/01/13 1220 05/01/13 2100 05/02/13 0210  NA 141 138  --  135  K 3.5 3.2*  --  3.7  CL  --  101  --  100  CO2 24 27  --  27  GLUCOSE 110 100*  --  110*  BUN 7.8  11  --  10  CREATININE 0.8 0.82  --  0.80  CALCIUM 8.8 8.8  --  8.5  MG  --   --  1.4*  --    GFR Estimated Creatinine Clearance: 83 ml/min (by C-G formula based on Cr of 0.8). Liver Function Tests:  Recent Labs Lab 04/27/13 1529  AST 15  ALT 16  ALKPHOS  106  BILITOT 1.00  PROT 6.5  ALBUMIN 2.7*   CBC:  Recent Labs Lab 04/27/13 1528 05/01/13 1220 05/02/13 0210  WBC 5.9 14.5* 10.6*  NEUTROABS 3.9  --   --   HGB 9.4* 11.0* 9.6*  HCT 28.0* 32.6* 28.9*  MCV 95.7 96.2 95.1  PLT 214 199 190   Cardiac Enzymes:  Recent Labs Lab 05/01/13 1220 05/01/13 2100 05/02/13 0210  TROPONINI <0.30 <0.30 <0.30   BNP (last 3 results)  Recent Labs  05/31/12 1600 05/01/13 1214  PROBNP 118.8 130.0*   CBG:  Recent Labs Lab 05/01/13 1844 05/01/13 2139 05/02/13 1226 05/02/13 1730 05/02/13 2153  GLUCAP 109* 120* 105* 116* 109*    Procedures and Diagnostic Studies: Dg Chest 2 View  05/01/2013   CLINICAL DATA:  Shortness of breath. Cough. Chest pain. Multiple myeloma.  EXAM: CHEST  2 VIEW  COMPARISON:  04/26/2013  FINDINGS: Heart size is normal. Both lungs are clear. No evidence of pleural effusion. No mass or lymphadenopathy identified. Right-sided power port remains in appropriate position.  IMPRESSION: Stable exam. No active disease.   Electronically Signed   By: Myles Rosenthal M.D.   On: 05/01/2013 12:57   Ct Angio Chest Pe W/cm &/or Wo Cm  05/01/2013   CLINICAL DATA:  Shortness of breath.  History of multiple myeloma.  EXAM: CT ANGIOGRAPHY CHEST WITH CONTRAST  TECHNIQUE: Multidetector CT imaging of the chest was performed using the standard protocol during bolus administration of intravenous contrast. Multiplanar CT image reconstructions including MIPs were obtained to evaluate the vascular anatomy.  CONTRAST:  OMNIPAQUE IOHEXOL 350 MG/ML SOLN  COMPARISON:  05/31/2012  FINDINGS: Negative for acute pulmonary embolism. Port-A-Cath tip is near the cavoatrial junction. No significant pericardial or pleural fluid. No significant chest lymphadenopathy. Heart size is normal. The images of the upper abdomen are unremarkable.  The trachea and mainstem bronchi are patent. There are subtle patchy densities in the superior segment of the right  lower lobe. Chronic changes along the medial aspect of the right upper lobe. Few subtle densities in the lateral right lung apex. Punctate nodular density in the right middle lobe on sequence 7, image 54 and subtle densities in posterior right upper lobe on image 34. No large areas of consolidation or airspace disease. Subtle hazy densities in the medial left lower lobe. Again noted are scattered subtle lucent lesions in the bones. Findings are compatible with history of multiple myeloma.  Review of the MIP images confirms the above findings.  IMPRESSION: Negative for pulmonary embolism.  There are subtle parenchymal densities in the lungs, particularly in the right lung. Findings are suggestive for an infectious or inflammatory process of unknown age.  Lucent bone lesions consistent with history of myeloma.   Electronically Signed   By: Richarda Overlie M.D.   On: 05/01/2013 14:20    Scheduled Meds: . atorvastatin  40 mg Oral Daily  . clopidogrel  75 mg Oral Q breakfast  . dicyclomine  20 mg Oral BID  . enoxaparin (LOVENOX) injection  40 mg Subcutaneous Q24H  . furosemide  40 mg Oral  BID  . gabapentin  600 mg Oral QID  . guaiFENesin  600 mg Oral BID  . insulin aspart  0-9 Units Subcutaneous TID WC  . levothyroxine  100 mcg Oral Daily  . morphine  15 mg Oral BID  . pantoprazole  40 mg Oral Daily  . piperacillin-tazobactam (ZOSYN)  IV  3.375 g Intravenous Once  . piperacillin-tazobactam  3.375 g Intravenous Q8H  . potassium chloride  10 mEq Oral BID  . vancomycin  1,250 mg Intravenous Q12H   Continuous Infusions:   Time spent: 25 min    LOS: 2 days   Hollice Espy  [facility] Pager [phone].   *Please note that the hospitalists switch teams on Wednesdays. Please call the flow manager at [phone] if you are having difficulty reaching the hospitalist taking care of this patient as she can update you and provide the most up-to-date pager number of provider caring for the patient. If  8PM-8AM, please contact night-coverage at [email or web address], password [email or web address]  05/03/2013, 8:46 AM

## 2013-05-03 NOTE — Progress Notes (Signed)
Patient ambulated in the hallway,his oxygen level on RA was 97%. Denies SOB.Hulda Marin RN

## 2013-05-04 ENCOUNTER — Ambulatory Visit: Payer: Medicare Other

## 2013-05-04 ENCOUNTER — Other Ambulatory Visit: Payer: Medicare Other | Admitting: Lab

## 2013-05-04 DIAGNOSIS — E039 Hypothyroidism, unspecified: Secondary | ICD-10-CM

## 2013-05-04 DIAGNOSIS — I519 Heart disease, unspecified: Secondary | ICD-10-CM

## 2013-05-04 LAB — BASIC METABOLIC PANEL
BUN: 9 mg/dL (ref 6–23)
Calcium: 8.7 mg/dL (ref 8.4–10.5)
Creatinine, Ser: 0.68 mg/dL (ref 0.50–1.10)
GFR calc Af Amer: >90 mL/min (ref >90)
GFR calc non Af Amer: >90 mL/min (ref >90)
Glucose, Bld: 103 mg/dL — ABNORMAL HIGH (ref 70–99)

## 2013-05-04 LAB — GLUCOSE, CAPILLARY

## 2013-05-04 LAB — CBC
MCH: 31.8 pg (ref 26.0–34.0)
MCHC: 33.5 g/dL (ref 30.0–36.0)
Platelets: 176 10*3/uL (ref 150–400)
RDW: 14.5 % (ref 11.5–15.5)

## 2013-05-04 MED ORDER — LEVOFLOXACIN 500 MG PO TABS: 500.0000 mg | ORAL_TABLET | Freq: Every day | ORAL | Status: AC

## 2013-05-04 MED ORDER — GUAIFENESIN ER 600 MG PO TB12: 600.0000 mg | ORAL_TABLET | Freq: Two times a day (BID) | ORAL | Status: DC | PRN

## 2013-05-04 MED ORDER — HEPARIN SOD (PORK) LOCK FLUSH 100 UNIT/ML IV SOLN
500.0000 [IU] | INTRAVENOUS | Status: DC | PRN
  Filled 2013-05-04: qty 5

## 2013-05-04 NOTE — Care Management Note (Signed)
Cm spoke with patient at the bedside with adult children present. Per pt recently active with [facility] for HHPT. Per pt choice [facility] to resume home care at discharge. Pt states having rw, rollator, and cane for dme use. Pt's adult children to assist in home care. No other needs identified.    Roxy Manns Berlie Persky,RN,MSN [phone]

## 2013-05-04 NOTE — Progress Notes (Signed)
Pt. Was discharged home. She was given her discharge information and all questions were answered.  She was transported home by family.

## 2013-05-04 NOTE — Discharge Summary (Signed)
Physician Discharge Summary  [name]:[id number] DOB: 1951-05-31 DOA: 05/01/2013  PCP: [name], R, [name]  Admit date: 05/01/2013 Discharge date: 05/04/2013  Time spent: 25 minutes  Recommendations for Outpatient Follow-up:  1. Home health PT will follow with the patient 2. She be discharged on 4 more days of Levaquin for total 7 days therapy  Discharge Diagnoses:  Active Problems:   MULTIPLE  MYELOMA   HYPOTHYROIDISM   CHEST DISCOMFORT   History of autologous stem cell transplant   HLD (hyperlipidemia)   Chronic pain   HCAP (healthcare-associated pneumonia)   History of stroke   Impaired glucose tolerance   Diastolic dysfunction   Loose stools, chronic   Normocytic anemia   Discharge Condition: Improved, being discharged home  Diet recommendation: Heart healthy  Filed Weights   05/01/13 1817  Weight: 89.041 kg (196 lb 4.8 oz)    History of present illness:  [name] is an 62 y.o. female with a PMH of recent left frontal ischemic stroke (discharged from the hospital 03/24/2013), multiple myeloma status post autologous peripheral stem cell transplant in 2009, chemotherapy with Relimid, Velcade and most recently switched to Carfilzomib in addition to palliative radiotherapy who was admitted on 05/01/2013 for a healthcare associated pneumonia.  Hospital Course:  Healthcare associated pneumonia: Patient's chest x-ray was initially clear but her CT scan of chest noted subtle parenchymal densities of the right lung consistent with pneumonia. Patient chronically immunosuppressed from chemotherapy and treatment dexamethasone, so she was started on empiric vancomycin and Zosyn for broad-spectrum coverage for healthcare associated pneumonia. Blood cultures remained negative. She was continued on pulmonary toilet. Mucinex was added for thick purulent sputum. Patient continued to improve and by 11/4, her white blood cell count was normal. She was able to ambulate  in the hallway on room air at 96%. Plan will be for her to be discharged home on 4 more days of Lovenox for total of 7 days of therapy. She will continue home health.  Active Problems: Multiple myeloma / history of autologous stem cell transplant  Currently on treatment with Carfilzomib and dexamethasone and tolerating her treatment fairly well. Dr. Arbutus Ped recommended holding her dose that is scheduled for early next week to give the patient more time to recover from her recent hospitalization.  History of stroke / hyperlipidemia  -Continue Plavix.  -Continue Lipitor.   Impaired glucose tolerance  -02/24/2013 hemoglobin A1c 6.4. Treated with metformin, which was on hold during patient's hospitalization will be restarted on discharge. -Continue NovoLog sliding scale. CBGs 109-120.   Chronic pain  -Continue MS Contin 15 mg twice a day as well as Norco when necessary.   Hypothyroidism  -Continue Synthroid.   Diastolic dysfunction  -04/16/2013 echocardiogram shows grade 1 diastolic dysfunction, EF 55-60%.  -Continued on furosemide, patient is euvolemic.   Chest discomfort  -Troponins cycled every 6 hours x3, negative. Felt to be more related to pneumonia  Normocytic anemia  -Given history of myeloma, this is most likely anemia of chronic disease. Baseline hemoglobin 10/11 mg/dL.    Procedures:  None  Consultations:  Dr. Mohamed-oncology  Discharge Exam: Filed Vitals:   05/04/13 0524  BP: 102/63  Pulse: 84  Temp: 98.2 F (36.8 C)  Resp: 16    General: Alert and oriented x3, no acute distress Cardiovascular: Regular rate and rhythm, S1-S2 Respiratory: Clear to auscultation bilaterally Abdomen: Soft, nontender, nondistended, positive bowel sounds Cervix: No clubbing cyanosis or edema  Discharge Instructions   Future Appointments Provider Department  Dept Phone   05/10/2013 9:30 AM Delcie Roch [facility] [phone]    05/18/2013 3:40 PM Maryln Gottron, MD [place] [facility] RADIATION ONCOLOGY [phone]       Medication List         ALPRAZolam 0.5 MG tablet  Commonly known as:  XANAX  Take 0.5 mg by mouth 3 (three) times daily as needed for anxiety.     atorvastatin 40 MG tablet  Commonly known as:  LIPITOR  Take 1 tablet (40 mg total) by mouth daily.     clopidogrel 75 MG tablet  Commonly known as:  PLAVIX  Take 1 tablet (75 mg total) by mouth daily with breakfast.     dexamethasone 4 MG tablet  Commonly known as:  DECADRON  Take 40 mg by mouth once a week. Taken on Thursdays.     dicyclomine 20 MG tablet  Commonly known as:  BENTYL  Take 20 mg by mouth 2 (two) times daily.     eszopiclone 3 MG Tabs  Generic drug:  Eszopiclone  Take 3 mg by mouth at bedtime.     furosemide 40 MG tablet  Commonly known as:  LASIX  Take 40 mg by mouth 2 (two) times daily.     gabapentin 300 MG capsule  Commonly known as:  NEURONTIN  Take 600 mg by mouth 4 (four) times daily.     guaiFENesin 600 MG 12 hr tablet  Commonly known as:  MUCINEX  Take 1 tablet (600 mg total) by mouth 2 (two) times daily as needed for cough.     hyaluronate sodium Gel  Apply 1 application topically 2 (two) times daily. Apply to affected skin area after rad txs and bedtime     HYDROcodone-acetaminophen 7.5-325 MG per tablet  Commonly known as:  NORCO  Take 1 tablet by mouth every 6 (six) hours as needed for pain.     levothyroxine 100 MCG tablet  Commonly known as:  SYNTHROID, LEVOTHROID  Take 100 mcg by mouth daily.     lidocaine-prilocaine cream  Commonly known as:  EMLA  Apply 1 application topically as needed (for port-a-cath access).     loperamide 2 MG capsule  Commonly known as:  IMODIUM  Take 2 mg by mouth 4 (four) times daily as needed for diarrhea or loose stools.     metFORMIN 500 MG tablet  Commonly known as:  GLUCOPHAGE  Take 500 mg by mouth daily with breakfast.     morphine 15 MG 12  hr tablet  Commonly known as:  MS CONTIN  Take 1 tablet (15 mg total) by mouth 2 (two) times daily.     multivitamin with minerals Tabs tablet  Take 1 tablet by mouth daily.     ondansetron 4 MG disintegrating tablet  Commonly known as:  ZOFRAN-ODT  Take 1 tablet by mouth every 8 (eight) hours as needed for nausea.     pantoprazole 40 MG tablet  Commonly known as:  PROTONIX  Take 40 mg by mouth daily.     potassium chloride 10 MEQ tablet  Commonly known as:  K-DUR,KLOR-CON  Take 10 mEq by mouth 2 (two) times daily.     PRESCRIPTION MEDICATION  Inject into the vein every 7 (seven) days. Velcade infusion every Thursday.       No Known Allergies     Follow-up Information   Follow up with [name], [name], R, [name] In 1 month.   Specialty:  Emergency Medicine   Contact information:   [address] [place] [address] [phone]        The results of significant diagnostics from this hospitalization (including imaging, microbiology, ancillary and laboratory) are listed below for reference.    Significant Diagnostic Studies: Dg Chest 2 View  05/01/2013   CLINICAL DATA:  Shortness of breath. Cough. Chest pain. Multiple myeloma.  EXAM: CHEST  2 VIEW  COMPARISON:  04/26/2013  FINDINGS: Heart size is normal. Both lungs are clear. No evidence of pleural effusion. No mass or lymphadenopathy identified. Right-sided power port remains in appropriate position.  IMPRESSION: Stable exam. No active disease.   Electronically Signed   By: Myles Rosenthal M.D.   On: 05/01/2013 12:57     Ct Angio Chest Pe W/cm &/or Wo Cm  05/01/2013    IMPRESSION: Negative for pulmonary embolism.  There are subtle parenchymal densities in the lungs, particularly in the right lung. Findings are suggestive for an infectious or inflammatory process of unknown age.  Lucent bone lesions consistent with history of myeloma.   Electronically Signed   By: Richarda Overlie M.D.   On: 05/01/2013 14:20       Microbiology: Recent Results (from the past 240 hour(s))  CULTURE, BLOOD (ROUTINE X 2)     Status: None   Collection Time    05/01/13  3:30 PM      Result Value Range Status   Specimen Description BLOOD RIGHT FOREARM   Final   Special Requests BOTTLES DRAWN AEROBIC ONLY 4CC   Final   Culture  Setup Time     Final   Value: 05/01/2013 23:34     Performed at [facility]   Culture     Final   Value:        BLOOD CULTURE RECEIVED NO GROWTH TO DATE CULTURE WILL BE HELD FOR 5 DAYS BEFORE ISSUING A FINAL NEGATIVE REPORT     Performed at [facility]   Report Status PENDING   Incomplete  CULTURE, BLOOD (ROUTINE X 2)     Status: None   Collection Time    05/01/13  3:35 PM      Result Value Range Status   Specimen Description BLOOD RIGHT ANTECUBITAL   Final   Special Requests BOTTLES DRAWN AEROBIC AND ANAEROBIC 3CC   Final   Culture  Setup Time     Final   Value: 05/01/2013 23:33     Performed at [facility]   Culture     Final   Value:        BLOOD CULTURE RECEIVED NO GROWTH TO DATE CULTURE WILL BE HELD FOR 5 DAYS BEFORE ISSUING A FINAL NEGATIVE REPORT     Performed at [facility]   Report Status PENDING   Incomplete     Labs: Basic Metabolic Panel:  Recent Labs Lab 04/27/13 1529 05/01/13 1220 05/01/13 2100 05/02/13 0210 05/04/13 0530  NA 141 138  --  135 138  K 3.5 3.2*  --  3.7 3.9  CL  --  101  --  100 105  CO2 24 27  --  27 25  GLUCOSE 110 100*  --  110* 103*  BUN 7.8 11  --  10 9  CREATININE 0.8 0.82  --  0.80 0.68  CALCIUM 8.8 8.8  --  8.5 8.7  MG  --   --  1.4*  --   --    Liver Function Tests:  Recent Labs Lab 04/27/13 1529  AST 15  ALT 16  ALKPHOS 106  BILITOT 1.00  PROT 6.5  ALBUMIN 2.7*   CBC:  Recent Labs Lab 04/27/13 1528 05/01/13 1220 05/02/13 0210 05/04/13 0530  WBC 5.9 14.5* 10.6* 4.8  NEUTROABS 3.9  --   --   --   HGB 9.4* 11.0* 9.6* 9.1*  HCT 28.0* 32.6* 28.9* 27.2*  MCV 95.7 96.2 95.1 95.1   PLT 214 199 190 176   Cardiac Enzymes:  Recent Labs Lab 05/01/13 1220 05/01/13 2100 05/02/13 0210  TROPONINI <0.30 <0.30 <0.30   BNP: BNP (last 3 results)  Recent Labs  05/31/12 1600 05/01/13 1214  PROBNP 118.8 130.0*   CBG:  Recent Labs Lab 05/03/13 0746 05/03/13 1141 05/03/13 1740 05/03/13 2124 05/04/13 0747  GLUCAP 88 116* 131* 116* 97       Signed:  Vinayak Bobier K  [facility] 05/04/2013, 11:26 AM

## 2013-05-05 ENCOUNTER — Ambulatory Visit: Payer: Medicare Other

## 2013-05-06 ENCOUNTER — Other Ambulatory Visit: Payer: Self-pay | Admitting: Internal Medicine

## 2013-05-06 ENCOUNTER — Other Ambulatory Visit: Payer: Self-pay

## 2013-05-07 ENCOUNTER — Telehealth: Payer: Self-pay | Admitting: Internal Medicine

## 2013-05-07 LAB — CULTURE, BLOOD (ROUTINE X 2)
Culture: NO GROWTH
Culture: NO GROWTH

## 2013-05-07 NOTE — Telephone Encounter (Signed)
returned pt call and advised that Dr MM would like for her to start back tx on 11.18...pt ok and aware

## 2013-05-07 NOTE — Telephone Encounter (Signed)
s.w. pt and advised on 11.18.14 appt....pt requested appt sched mailed done

## 2013-05-10 ENCOUNTER — Other Ambulatory Visit: Payer: Self-pay | Admitting: *Deleted

## 2013-05-10 ENCOUNTER — Other Ambulatory Visit (HOSPITAL_BASED_OUTPATIENT_CLINIC_OR_DEPARTMENT_OTHER): Payer: Medicare Other | Admitting: Lab

## 2013-05-10 DIAGNOSIS — C9000 Multiple myeloma not having achieved remission: Secondary | ICD-10-CM

## 2013-05-10 DIAGNOSIS — R197 Diarrhea, unspecified: Secondary | ICD-10-CM

## 2013-05-10 DIAGNOSIS — C9002 Multiple myeloma in relapse: Secondary | ICD-10-CM

## 2013-05-10 LAB — CBC WITH DIFFERENTIAL/PLATELET
BASO%: 0.5 % (ref 0.0–2.0)
Basophils Absolute: 0 10*3/uL (ref 0.0–0.1)
EOS%: 1.5 % (ref 0.0–7.0)
HGB: 10.3 g/dL — ABNORMAL LOW (ref 11.6–15.9)
LYMPH%: 16.7 % (ref 14.0–49.7)
MCH: 32.7 pg (ref 25.1–34.0)
MCV: 96.7 fL (ref 79.5–101.0)
MONO#: 0.5 10*3/uL (ref 0.1–0.9)
MONO%: 8.8 % (ref 0.0–14.0)
NEUT#: 3.9 10*3/uL (ref 1.5–6.5)
RBC: 3.15 10*6/uL — ABNORMAL LOW (ref 3.70–5.45)
RDW: 15 % — ABNORMAL HIGH (ref 11.2–14.5)
lymph#: 0.9 10*3/uL (ref 0.9–3.3)

## 2013-05-10 LAB — COMPREHENSIVE METABOLIC PANEL (CC13)
ALT: 14 U/L (ref 0–55)
AST: 13 U/L (ref 5–34)
Albumin: 3.3 g/dL — ABNORMAL LOW (ref 3.5–5.0)
BUN: 15.9 mg/dL (ref 7.0–26.0)
Calcium: 9.3 mg/dL (ref 8.4–10.4)
Chloride: 107 mEq/L (ref 98–109)
Glucose: 117 mg/dl (ref 70–140)
Potassium: 3.4 mEq/L — ABNORMAL LOW (ref 3.5–5.1)
Sodium: 143 mEq/L (ref 136–145)
Total Protein: 7.4 g/dL (ref 6.4–8.3)

## 2013-05-10 MED ORDER — MORPHINE SULFATE ER 15 MG PO TBCR: 15.0000 mg | EXTENDED_RELEASE_TABLET | Freq: Two times a day (BID) | ORAL | Status: DC

## 2013-05-18 ENCOUNTER — Other Ambulatory Visit (HOSPITAL_BASED_OUTPATIENT_CLINIC_OR_DEPARTMENT_OTHER): Payer: Medicare Other | Admitting: Lab

## 2013-05-18 ENCOUNTER — Other Ambulatory Visit: Payer: Self-pay | Admitting: Emergency Medicine

## 2013-05-18 ENCOUNTER — Encounter: Payer: Self-pay | Admitting: Physician Assistant

## 2013-05-18 ENCOUNTER — Ambulatory Visit (HOSPITAL_BASED_OUTPATIENT_CLINIC_OR_DEPARTMENT_OTHER): Payer: Medicare Other

## 2013-05-18 ENCOUNTER — Telehealth: Payer: Self-pay | Admitting: Internal Medicine

## 2013-05-18 ENCOUNTER — Telehealth: Payer: Self-pay | Admitting: Emergency Medicine

## 2013-05-18 ENCOUNTER — Telehealth: Payer: Self-pay | Admitting: *Deleted

## 2013-05-18 ENCOUNTER — Ambulatory Visit: Payer: Medicare Other | Admitting: Radiation Oncology

## 2013-05-18 ENCOUNTER — Ambulatory Visit (HOSPITAL_BASED_OUTPATIENT_CLINIC_OR_DEPARTMENT_OTHER): Payer: Medicare Other | Admitting: Physician Assistant

## 2013-05-18 VITALS — BP 139/73 | HR 115 | Temp 98.4°F | Resp 18 | Ht 66.0 in | Wt 192.0 lb

## 2013-05-18 DIAGNOSIS — C9001 Multiple myeloma in remission: Secondary | ICD-10-CM

## 2013-05-18 DIAGNOSIS — C9002 Multiple myeloma in relapse: Secondary | ICD-10-CM

## 2013-05-18 DIAGNOSIS — C9000 Multiple myeloma not having achieved remission: Secondary | ICD-10-CM

## 2013-05-18 DIAGNOSIS — R197 Diarrhea, unspecified: Secondary | ICD-10-CM

## 2013-05-18 DIAGNOSIS — M25519 Pain in unspecified shoulder: Secondary | ICD-10-CM

## 2013-05-18 DIAGNOSIS — Z5112 Encounter for antineoplastic immunotherapy: Secondary | ICD-10-CM

## 2013-05-18 DIAGNOSIS — M25569 Pain in unspecified knee: Secondary | ICD-10-CM

## 2013-05-18 DIAGNOSIS — G609 Hereditary and idiopathic neuropathy, unspecified: Secondary | ICD-10-CM

## 2013-05-18 LAB — CBC WITH DIFFERENTIAL/PLATELET
EOS%: 0.4 % (ref 0.0–7.0)
Eosinophils Absolute: 0 10*3/uL (ref 0.0–0.5)
LYMPH%: 9.3 % — ABNORMAL LOW (ref 14.0–49.7)
MCH: 32.2 pg (ref 25.1–34.0)
MCHC: 32.9 g/dL (ref 31.5–36.0)
MCV: 97.8 fL (ref 79.5–101.0)
MONO%: 2.8 % (ref 0.0–14.0)
NEUT#: 6.4 10*3/uL (ref 1.5–6.5)
Platelets: 248 10*3/uL (ref 145–400)
RBC: 3.12 10*6/uL — ABNORMAL LOW (ref 3.70–5.45)
RDW: 15.4 % — ABNORMAL HIGH (ref 11.2–14.5)
lymph#: 0.7 10*3/uL — ABNORMAL LOW (ref 0.9–3.3)

## 2013-05-18 LAB — COMPREHENSIVE METABOLIC PANEL (CC13)
AST: 17 U/L (ref 5–34)
Albumin: 3.4 g/dL — ABNORMAL LOW (ref 3.5–5.0)
Alkaline Phosphatase: 84 U/L (ref 40–150)
Potassium: 3.2 mEq/L — ABNORMAL LOW (ref 3.5–5.1)
Sodium: 142 mEq/L (ref 136–145)
Total Bilirubin: 1.33 mg/dL — ABNORMAL HIGH (ref 0.20–1.20)
Total Protein: 7.4 g/dL (ref 6.4–8.3)

## 2013-05-18 MED ORDER — DEXTROSE 5 % IV SOLN
27.0000 mg/m2 | Freq: Once | INTRAVENOUS | Status: AC
  Administered 2013-05-18: 56 mg via INTRAVENOUS
  Filled 2013-05-18: qty 28

## 2013-05-18 MED ORDER — SODIUM CHLORIDE 0.9 % IJ SOLN
10.0000 mL | INTRAMUSCULAR | Status: DC | PRN
  Administered 2013-05-18: 10 mL
  Filled 2013-05-18: qty 10

## 2013-05-18 MED ORDER — HEPARIN SOD (PORK) LOCK FLUSH 100 UNIT/ML IV SOLN
500.0000 [IU] | Freq: Once | INTRAVENOUS | Status: AC | PRN
  Administered 2013-05-18: 500 [IU]
  Filled 2013-05-18: qty 5

## 2013-05-18 MED ORDER — ONDANSETRON 8 MG/NS 50 ML IVPB
INTRAVENOUS | Status: AC
  Filled 2013-05-18: qty 8

## 2013-05-18 MED ORDER — ONDANSETRON 8 MG/50ML IVPB (CHCC)
8.0000 mg | Freq: Once | INTRAVENOUS | Status: AC
  Administered 2013-05-18: 8 mg via INTRAVENOUS

## 2013-05-18 MED ORDER — FLUCONAZOLE 150 MG PO TABS: 150.0000 mg | ORAL_TABLET | ORAL | Status: DC

## 2013-05-18 MED ORDER — HYDROCODONE-ACETAMINOPHEN 7.5-325 MG PO TABS: 1.0000 | ORAL_TABLET | Freq: Four times a day (QID) | ORAL | Status: DC | PRN

## 2013-05-18 MED ORDER — SODIUM CHLORIDE 0.9 % IV SOLN
Freq: Once | INTRAVENOUS | Status: AC
  Administered 2013-05-18: 14:00:00 via INTRAVENOUS

## 2013-05-18 MED ORDER — DEXAMETHASONE SODIUM PHOSPHATE 10 MG/ML IJ SOLN
INTRAMUSCULAR | Status: AC
  Filled 2013-05-18: qty 1

## 2013-05-18 MED ORDER — ACYCLOVIR 400 MG PO TABS: 400.0000 mg | ORAL_TABLET | Freq: Every day | ORAL | Status: DC

## 2013-05-18 MED ORDER — DEXAMETHASONE SODIUM PHOSPHATE 10 MG/ML IJ SOLN
10.0000 mg | Freq: Once | INTRAMUSCULAR | Status: AC
  Administered 2013-05-18: 10 mg via INTRAVENOUS

## 2013-05-18 NOTE — Patient Instructions (Signed)
[facility] Discharge Instructions for Patients Receiving Chemotherapy  Today you received the following chemotherapy agent: Kyprolis  To help prevent nausea and vomiting after your treatment, we encourage you to take your nausea medication as prescribed.    If you develop nausea and vomiting that is not controlled by your nausea medication, call the clinic.   BELOW ARE SYMPTOMS THAT SHOULD BE REPORTED IMMEDIATELY:  *FEVER GREATER THAN 100.5 F  *CHILLS WITH OR WITHOUT FEVER  NAUSEA AND VOMITING THAT IS NOT CONTROLLED WITH YOUR NAUSEA MEDICATION  *UNUSUAL SHORTNESS OF BREATH  *UNUSUAL BRUISING OR BLEEDING  TENDERNESS IN MOUTH AND THROAT WITH OR WITHOUT PRESENCE OF ULCERS  *URINARY PROBLEMS  *BOWEL PROBLEMS  UNUSUAL RASH Items with * indicate a potential emergency and should be followed up as soon as possible.  Feel free to call the clinic you have any questions or concerns. The clinic phone number is [phone].    

## 2013-05-18 NOTE — Patient Instructions (Signed)
Continue with your labs and chemotherapy as scheduled Follow up in 2 weeks for symptom management visit

## 2013-05-18 NOTE — Telephone Encounter (Signed)
PT CALLED AFTER BEING DISCHARGED FROM  WITH PNE.  REQUESTING DIFLUCAN WITH 2 REFILLS, FOR SEVERE YEAST INFECTION? PLEASE ADVISE PT  [phone] PHARM: [facility] [place] PT ADVISED THAT SHE HAS TO GO TO [facility]ARMACY [facility] TO PICK UP ANOTHER RX AND WOULD APPRECIATE BEING ABLE TO GET BOTH AT SAME TIME.

## 2013-05-18 NOTE — Progress Notes (Signed)
Patient reports perineal rash that she noticed this past weekend after being placed on levaquin while hospitalized. Tiana Loft, PA notified. Per PA, patient to report s/sx to PCP or gyn for further instruction. Patient agrees with this recommendation and verbalizes understanding.

## 2013-05-18 NOTE — Telephone Encounter (Signed)
Pt given appt schedule for November and December.

## 2013-05-18 NOTE — Telephone Encounter (Signed)
WE will send RX to pharmacy

## 2013-05-18 NOTE — Telephone Encounter (Signed)
Pr family request, we have moved appt from this week to next week

## 2013-05-18 NOTE — Progress Notes (Addendum)
[facility] [phone]   [phone]  SHARED VISIT PROGRESS NOTE  [name], [name], R, [name] [address] [place] [address]  DIAGNOSIS: Recurrent multiple myeloma initially diagnosed in October 2008.  PRIOR THERAPY:  1. Status post palliative radiotherapy to the lumbar spine between L3 and L5. The patient received a total dose of 3000 cGy in 10 fractions under the care of Dr. Mitzi Hansen between May 05, 2007 through May 18, 2007. 2. Status post 5 cycles of systemic chemotherapy with Revlimid and low-dose Decadron with good response to this treatment. 3. Status post autologous peripheral blood stem cell transplant at [facility] on Nov 20, 2007 under the care of Dr. Vicente Serene. 4. Status post treatment for disease recurrence with Velcade, Doxil and Decadron. Last dose given Nov 09, 2009. Discontinued secondary to intolerance but the patient had a good response to treatment at that time. 5. Status post palliative radiotherapy to the T2-T6 thoracic vertebrae completed 03/21/2011 under the care of Dr. Mitzi Hansen. 6. Systemic chemotherapy with Velcade 1.3 mg per meter squared given on days 1, 4, 8 and 11, and Doxil at 30 mg per meter squared given on day 4 in addition to Decadron status post 4 cycles, discontinued secondary to intolerance. 7. Systemic therapy with Velcade 1.3 mg/M2 subcutaneously in addition to Decadron 40 mg by mouth on a weekly basis, status post 20 cycles. The patient had good response with this treatment but it is discontinue today secondary to worsening peripheral neuropathy. 8. Palliative radiotherapy to the skull lesion as well as the left hip area under the care of Dr. Mitzi Hansen CURRENT THERAPY:   1. Systemic chemotherapy with Carfilzomib 20 mg/M2 on days 1, 2,  8, 9, 15 and 16 every 4 weeks in addition to weekly Decadron 40 mg by mouth. First dose on 04/19/2013. From cycle to forward the Carfilzomib was increased to 27 mg/M2 on days  1, 2, 8, 9, 15 and 16 2. Zometa 4 mg IV given every 3 months   INTERVAL HISTORY: [name] 62 y.o. female returns to the clinic today for follow up visit accompanied by her son and daughter. The patient is feeling fine today except for the aching pain affecting her knees and left shoulder as well as some lower extremity soreness. The [place] is [place]. To accommodate her travel schedule she is asking that days 8 and 9 be switched to a Monday and Tuesday instead of  usual Tuesday and Wednesday treatment days. She requests a refill for her hydrocodone tablets.  She still on Neurontin 600 mg by mouth 4 times a day. Her treatment with Velcade was discontinued recently secondary to significant peripheral neuropathy. She denied having any significant weight loss or night sweats. The patient denied having any nausea or vomiting. She has no chest pain, shortness of breath, cough or hemoptysis.  MEDICAL HISTORY: Past Medical History  Diagnosis Date  . Thyroid disease Hypothyroidism  . Hypercholesterolemia   . Compression fracture 04/08/2007    pathologic compression fracture  . Hypothyroidism   . FHx: chemotherapy     s/p 5 cycle revlimid/low dose decadron,s/p velcade,doxil,decadron,  . Hx of radiation therapy 05/05/07-05/18/07,& 03/05/11-03/21/11-    l3&l5 in 2008, t2-t6 03/2011  . GERD (gastroesophageal reflux disease)   . Insomnia     associated with steroids  . Nausea   . Constipation     takes oxycontin,vicodin  . Hx of radiation  therapy 05/05/2007 to 05/18/2007    palliative, L3-5  . 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 Hx of radiation therapy 03/05/2011 to 03/21/2011    palliative T2-T6, c-spine  . 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 PONV (postoperative nausea and vomiting)   . Metastasis to bone     ALLERGIES:  has No Known Allergies.  MEDICATIONS:  Current Outpatient Prescriptions  Medication  Sig Dispense Refill  . acyclovir (ZOVIRAX) 800 MG tablet Take 800 mg by mouth.      . ALPRAZolam (XANAX) 0.5 MG tablet Take 0.5 mg by mouth 3 (three) times daily as needed for anxiety.       . cyclobenzaprine (FLEXERIL) 10 MG tablet 10 mg.      . dexamethasone (DECADRON) 4 MG tablet Take 40 mg by mouth once a week. Taken on Thursdays.      Marland Kitchen ESZOPICLONE 3 MG tablet Take 3 mg by mouth at bedtime.       . furosemide (LASIX) 40 MG tablet Take 40 mg by mouth 2 (two) times daily.      Marland Kitchen gabapentin (NEURONTIN) 300 MG capsule Take 600 mg by mouth 4 (four) times daily.       Marland Kitchen guaiFENesin (MUCINEX) 600 MG 12 hr tablet Take 1 tablet (600 mg total) by mouth 2 (two) times daily as needed for cough.  20 tablet  0  . hyaluronate sodium (RADIAPLEXRX) GEL Apply 1 application topically 2 (two) times daily. Apply to affected skin area after rad txs and bedtime      . HYDROcodone-acetaminophen (NORCO) 7.5-325 MG per tablet Take 1 tablet by mouth every 6 (six) hours as needed.  30 tablet  0  . levothyroxine (SYNTHROID, LEVOTHROID) 100 MCG tablet Take 100 mcg by mouth daily.       Marland Kitchen lidocaine-prilocaine (EMLA) cream Apply 1 application topically as needed (for port-a-cath access).      Marland Kitchen loperamide (IMODIUM) 2 MG capsule Take 2 mg by mouth 4 (four) times daily as needed for diarrhea or loose stools.      . metFORMIN (GLUCOPHAGE) 500 MG tablet Take 500 mg by mouth daily with breakfast.      . morphine (MS CONTIN) 15 MG 12 hr tablet Take 1 tablet (15 mg total) by mouth 2 (two) times daily.  60 tablet  0  . Multiple Vitamin (MULITIVITAMIN WITH MINERALS) TABS Take 1 tablet by mouth daily.      . ondansetron (ZOFRAN-ODT) 4 MG disintegrating tablet Take 1 tablet by mouth every 8 (eight) hours as needed for nausea.       . pantoprazole (PROTONIX) 40 MG tablet Take 40 mg by mouth daily.      . potassium chloride (K-DUR,KLOR-CON) 10 MEQ tablet Take 10 mEq by mouth 2 (two) times daily.      Marland Kitchen PRESCRIPTION MEDICATION Inject  into the vein every 7 (seven) days. Velcade infusion every Thursday.      Marland Kitchen acyclovir (ZOVIRAX) 400 MG tablet Take 1 tablet (400 mg total) by mouth daily.  30 tablet  3  . atorvastatin (LIPITOR) 40 MG tablet Take 1 tablet (40 mg total) by mouth daily.  30 tablet  1  . clopidogrel (PLAVIX) 75 MG tablet Take 1 tablet (75 mg total) by mouth daily with breakfast.  30 tablet  1  . dicyclomine (BENTYL) 20 MG tablet Take 20 mg by mouth 2 (two) times daily.  No current facility-administered medications for this visit.   Facility-Administered Medications Ordered in Other Visits  Medication Dose Route Frequency Provider Last Rate Last Dose  . 0.9 %  sodium chloride infusion   Intravenous Once Si Gaul, MD      . ondansetron (ZOFRAN) IVPB 8 mg  8 mg Intravenous Once Si Gaul, MD      . sodium chloride 0.9 % injection 10 mL  10 mL Intracatheter PRN Si Gaul, MD      . sodium chloride 0.9 % injection 10 mL  10 mL Intracatheter PRN Si Gaul, MD   10 mL at 05/18/13 1506    SURGICAL HISTORY:  Past Surgical History  Procedure Laterality Date  . Cholecystectomy    . Knee surgery    . Knee surgery    . Video bronchoscopy  07/30/2011    Procedure: VIDEO BRONCHOSCOPY WITHOUT FLUORO;  Surgeon: Barbaraann Share, MD;  Location: [facility];  Service: Cardiopulmonary;  Laterality: Bilateral;    REVIEW OF SYSTEMS:  Constitutional: positive for fatigue Eyes: negative Ears, nose, mouth, throat, and face: negative Respiratory: negative Cardiovascular: negative Gastrointestinal: negative Genitourinary:negative Integument/breast: negative Hematologic/lymphatic: negative Musculoskeletal:positive for back pain and bone pain Neurological: positive for paresthesia and seizures Behavioral/Psych: negative Endocrine: negative Allergic/Immunologic: negative   PHYSICAL EXAMINATION: General appearance: alert, cooperative, fatigued and no distress Head: Normocephalic, without obvious  abnormality, atraumatic Neck: no adenopathy, no JVD, supple, symmetrical, trachea midline and thyroid not enlarged, symmetric, no tenderness/mass/nodules Lymph nodes: Cervical, supraclavicular, and axillary nodes normal. Resp: clear to auscultation bilaterally Back: symmetric, no curvature. ROM normal. No CVA tenderness. Cardio: regular rate and rhythm, S1, S2 normal, no murmur, click, rub or gallop GI: soft, non-tender; bowel sounds normal; no masses,  no organomegaly Extremities: extremities normal, atraumatic, no cyanosis or edema Neurologic: Alert and oriented X 3, normal strength and tone. Normal symmetric reflexes. Normal coordination and gait  ECOG PERFORMANCE STATUS: 1 - Symptomatic but completely ambulatory  Blood pressure 139/73, pulse 115, temperature 98.4 F (36.9 C), temperature source Oral, resp. rate 18, height 5\' 6"  (1.676 m), weight 192 lb (87.091 kg).  LABORATORY DATA: Lab Results  Component Value Date   WBC 7.4 05/18/2013   HGB 10.0* 05/18/2013   HCT 30.5* 05/18/2013   MCV 97.8 05/18/2013   PLT 248 05/18/2013      Chemistry      Component Value Date/Time   NA 142 05/18/2013 1207   NA 138 05/04/2013 0530   K 3.2* 05/18/2013 1207   K 3.9 05/04/2013 0530   CL 105 05/04/2013 0530   CL 105 12/17/2012 1015   CO2 25 05/18/2013 1207   CO2 25 05/04/2013 0530   BUN 16.1 05/18/2013 1207   BUN 9 05/04/2013 0530   CREATININE 1.1 05/18/2013 1207   CREATININE 0.68 05/04/2013 0530      Component Value Date/Time   CALCIUM 9.9 05/18/2013 1207   CALCIUM 8.7 05/04/2013 0530   ALKPHOS 84 05/18/2013 1207   ALKPHOS 78 02/24/2013 1500   AST 17 05/18/2013 1207   AST 22 02/24/2013 1500   ALT 11 05/18/2013 1207   ALT 26 02/24/2013 1500   BILITOT 1.33* 05/18/2013 1207   BILITOT 0.4 02/24/2013 1500       RADIOGRAPHIC STUDIES: Dg Bone Survey Met  04/12/2013   CLINICAL DATA:  Current history of multiple myeloma, originally diagnosed in 2009 at which time the patient underwent  peripheral blood stem cell transplant, recurrence in 2011 for which she continues chemotherapy. Patient has  had palliative radiation therapy to the lumbar spine between L3 and L5 in 2008 and to the skull and left pelvis/hip currently. Subsequent encounter for persistent low back pain radiating into the left lower extremity.  EXAM: METASTATIC BONE SURVEY  COMPARISON:  Metastatic bone survey 11/08/2011. Bone window images from CT Head 02/24/2013 and CT abdomen pelvis 01/26/2013.  FINDINGS: Interval coalescence of the adjacent lucent right parietal skull lesions into 1 larger lesion since the prior examination, as noted on the recent CT head. The left calvarial lesion identified on the CT is superimposed upon the larger right lesion. No new skull lesions.  Degenerative disk disease and spondylosis at C5-6, C6-7, and to a lesser degree C2-3 unchanged. Possible lucent lesions in the upper endplates of C5 and C6, unchanged. No new abnormalities involving the cervical spine.  Possible lucent lesion in the T4 vertebral body, not visualized previously. Generalized osseous demineralization. T12 with small, rudimentary ribs as noted previously. No thoracic spine fractures.  Stable compression fracture of L4 on the order of 30% or so and stable compression fracture of the upper endplate of L2 on the order of 10-20% or so, with post-radiation changes at L2, L3, and L4. No new abnormalities involving the lumbar spine. Degenerative disk disease and spondylosis at L3-4.  Lucent lesion in the posterior column of the left acetabulum vaguely visible on the x-ray no new abnormalities involving the bony pelvis or either femur. Degenerative changes noted involving the right knee.  Possible new lucent lesion in the left glenoid. No new lesions elsewhere involving either the right or left shoulder girdle or either humerus.  Frontal chest to include the ribs demonstrate no definite rib lesions. Cardiomediastinal silhouette unremarkable.  Allowing for expiratory technique, lungs clear. Right jugular Port-A-Cath tip in the lower SVC at or near the cavoatrial junction.  IMPRESSION: 1. Bilateral parietal skull lesions, right much larger than left, as noted on the CT head in August, 2014. No new skull lesions. 2. Possible lucent lesions in the upper endplates of C5 and C6, unchanged. 3. Possible new small lesion in the T4 vertebral body. 4. Stable compression fractures of L4 and L2 and post-radiation changes at L2, L3, and L4. No new lumbar spine lesions. 5. Vaguely visible lucent lesion in the posterior column of the left acetabulum as noted on prior CT abdomen and pelvis in July, 2014. No new pelvic lesions. 6. Possible new lucent lesion in the left glenoid. 7. No evidence of rib involvement as questioned previously.   Electronically Signed   By: Hulan Saas M.D.   On: 04/12/2013 16:09    ASSESSMENT AND PLAN:  This is a very pleasant 62 years old [place] American female with recurrent multiple myeloma recently completed a course of treatment with Velcade and Decadron with improvement in her disease but this was discontinued secondary to peripheral neuropathy. The recent skeletal bone survey showed few small areas of new lesion but the majority of her lesions were stable. She is status post 1 cycle with Carfilzomib starting at a dose of 20 mg/M2 on days 1, 2, 8, 9, 15 and 16 every 4 weeks in addition to weekly Decadron 40 mg by mouth. Overall she tolerated this first cycle relatively well. Patient was discussed with an also seen by Dr. Arbutus Ped. From cycle to forward she will continue treatment with but at the following dose:Carfilzomib starting at a dose of 27 mg/M2 on days 1, 2, 8, 9, 15 and 16 every 4 weeks in addition to weekly Decadron  40 mg by mouth. The patient is given a refill prescription for her Vicodin tablets. For prophylaxis a prescription for acyclovir 400 mg by mouth daily was sent her pharmacy of record via E. scribed. The  patient would come back for follow up visit in 2 weeks for reevaluation and management any adverse effect of her treatment. We will make adjustments in her schedule so that we can accommodate her for a travel plans.  Conni Slipper, [name]   The patient voices understanding of current disease status and treatment options and is in agreement with the current care plan.  All questions were answered. The patient knows to call the clinic with any problems, questions or concerns. We can certainly see the patient much sooner if necessary.  ADDENDUM: Hematology/Oncology Attending: I had a face to face encounter with the patient today. I recommended her care plan. This is a very pleasant 62 years old [place] female with recurrent multiple myeloma currently on treatment with Carfilzomib and Decadron status post 1 cycle. The patient related the first cycle of her treatment fairly well with no significant adverse effects. We will increase her dose of Carfilzomib to 27 mg/M2 on days 1, 2, 8, 9, 15 and 16 every 4 weeks. We will also start the patient on prophylactic acyclovir 400 mg by mouth twice a day. For pain management the patient was given a refill of Vicodin today. She would come back for followup visit in 2 weeks for reevaluation and management any adverse effect of her treatment. She was advised to call immediately if she has any concerning symptoms in the interval. Lajuana Matte., MD 05/18/2013

## 2013-05-19 ENCOUNTER — Ambulatory Visit: Payer: Medicare Other

## 2013-05-19 ENCOUNTER — Ambulatory Visit (HOSPITAL_BASED_OUTPATIENT_CLINIC_OR_DEPARTMENT_OTHER): Payer: Medicare Other

## 2013-05-19 VITALS — BP 102/69 | HR 102 | Temp 98.6°F | Resp 18

## 2013-05-19 DIAGNOSIS — C9000 Multiple myeloma not having achieved remission: Secondary | ICD-10-CM

## 2013-05-19 DIAGNOSIS — Z5112 Encounter for antineoplastic immunotherapy: Secondary | ICD-10-CM

## 2013-05-19 DIAGNOSIS — C9002 Multiple myeloma in relapse: Secondary | ICD-10-CM

## 2013-05-19 MED ORDER — DEXAMETHASONE SODIUM PHOSPHATE 10 MG/ML IJ SOLN
INTRAMUSCULAR | Status: AC
  Filled 2013-05-19: qty 1

## 2013-05-19 MED ORDER — ONDANSETRON 8 MG/NS 50 ML IVPB
INTRAVENOUS | Status: AC
  Filled 2013-05-19: qty 8

## 2013-05-19 MED ORDER — SODIUM CHLORIDE 0.9 % IJ SOLN
10.0000 mL | INTRAMUSCULAR | Status: DC | PRN
  Administered 2013-05-19: 10 mL
  Filled 2013-05-19: qty 10

## 2013-05-19 MED ORDER — DEXAMETHASONE SODIUM PHOSPHATE 10 MG/ML IJ SOLN
10.0000 mg | Freq: Once | INTRAMUSCULAR | Status: AC
  Administered 2013-05-19: 10 mg via INTRAVENOUS

## 2013-05-19 MED ORDER — SODIUM CHLORIDE 0.9 % IV SOLN
Freq: Once | INTRAVENOUS | Status: AC
  Administered 2013-05-19: 09:00:00 via INTRAVENOUS

## 2013-05-19 MED ORDER — DEXTROSE 5 % IV SOLN
27.0000 mg/m2 | Freq: Once | INTRAVENOUS | Status: AC
  Administered 2013-05-19: 56 mg via INTRAVENOUS
  Filled 2013-05-19: qty 28

## 2013-05-19 MED ORDER — ONDANSETRON 8 MG/50ML IVPB (CHCC)
8.0000 mg | Freq: Once | INTRAVENOUS | Status: AC
Start: 2013-05-19 — End: 2013-05-19
  Administered 2013-05-19: 8 mg via INTRAVENOUS

## 2013-05-19 MED ORDER — HEPARIN SOD (PORK) LOCK FLUSH 100 UNIT/ML IV SOLN
500.0000 [IU] | Freq: Once | INTRAVENOUS | Status: AC | PRN
  Administered 2013-05-19: 500 [IU]
  Filled 2013-05-19: qty 5

## 2013-05-19 NOTE — Patient Instructions (Signed)
[facility] Discharge Instructions for Patients Receiving Chemotherapy  Today you received the following chemotherapy agents Kyprolis To help prevent nausea and vomiting after your treatment, we encourage you to take your nausea medication as prescribed.  If you develop nausea and vomiting that is not controlled by your nausea medication, call the clinic.   BELOW ARE SYMPTOMS THAT SHOULD BE REPORTED IMMEDIATELY:  *FEVER GREATER THAN 100.5 F  *CHILLS WITH OR WITHOUT FEVER  NAUSEA AND VOMITING THAT IS NOT CONTROLLED WITH YOUR NAUSEA MEDICATION  *UNUSUAL SHORTNESS OF BREATH  *UNUSUAL BRUISING OR BLEEDING  TENDERNESS IN MOUTH AND THROAT WITH OR WITHOUT PRESENCE OF ULCERS  *URINARY PROBLEMS  *BOWEL PROBLEMS  UNUSUAL RASH Items with * indicate a potential emergency and should be followed up as soon as possible.  Feel free to call the clinic you have any questions or concerns. The clinic phone number is [phone].    

## 2013-05-20 ENCOUNTER — Ambulatory Visit: Admission: RE | Admit: 2013-05-20 | Payer: Medicare Other | Source: Ambulatory Visit | Admitting: Radiation Oncology

## 2013-05-20 NOTE — Addendum Note (Signed)
Encounter addended by: Jonna Coup, MD on: 05/20/2013  8:02 AM<BR>     Documentation filed: Notes Section

## 2013-05-20 NOTE — Progress Notes (Addendum)
  Radiation Oncology         [phone] ________________________________  Name: [name] MRN: [id number]  Date: 04/05/2013  DOB: [birth year]  SIMULATION AND TREATMENT PLANNING NOTE  DIAGNOSIS:  Multiple myeloma  NARRATIVE:  The patient was brought to the CT Simulation planning suite.  Identity was confirmed.  All relevant records and images related to the planned course of therapy were reviewed.   Written consent to proceed with treatment was confirmed which was freely given after reviewing the details related to the planned course of therapy had been reviewed with the patient.  Then, the patient was set-up in a stable reproducible  supine position for radiation therapy.  CT images were obtained.  Surface markings were placed.   a customized VAC lock bag was constructed to help with patient immobilization and this complex treatment device will be used daily.  The CT images were loaded into the planning software.  Then the target and avoidance structures were contoured.  Treatment planning then occurred.  The radiation prescription was entered and confirmed.  A total of 2 complex treatment devices were fabricated which relate to the designed radiation treatment fields. Each of these customized fields/ complex treatment devices will be used on a daily basis during the radiation course. I have requested : Isodose Plan.   PLAN:  The patient will receive 20 Gy in 5 fractions.  ________________________________   Radene Gunning, MD, PhD     Addendum: The final plan to treat the left pelvis was ultimately planned using a three-field technique that utilized 3 customized fields. These 3 medically necessary complex treatment devices will be used each fraction in addition to the patient's customized VAC lock bag which also constitutes an additional complex treatment device which will be used for the patient's treatment.

## 2013-05-20 NOTE — Progress Notes (Signed)
  Radiation Oncology         [phone] ________________________________  Name: [name] MRN: [id number]  Date: 04/06/2013  DOB: 03-Jun-1951  Simulation Verification Note   NARRATIVE: The patient was brought to the treatment unit and placed in the planned treatment position. The clinical setup was verified. Then port films were obtained and uploaded to the radiation oncology medical record software.  The treatment beams were carefully compared against the planned radiation fields. The position, location, and shape of the radiation fields was reviewed. The targeted volume of tissue appears to be appropriately covered by the radiation beams. Based on my personal review, I approved the simulation verification. The patient's treatment will proceed as planned.  ________________________________   Radene Gunning, MD, PhD

## 2013-05-20 NOTE — Progress Notes (Signed)
  Radiation Oncology         [phone] ________________________________  Name: [name] MRN: [id number]  Date: 04/12/2013  DOB: 04-20-1951  End of Treatment Note  Diagnosis:   Multiple myeloma     Indication for treatment:  Palliative       Radiation treatment dates:   03/30/2013 through 04/12/2013  Site/dose:   The patient was treated to a skull metastasis using a 2 field technique to 30 gray in 10 fractions at 3 gray per fraction. She complained of pelvic pain during treatment and additional treatment was planned to a metastasis within the left pelvis. This was treated using a three-field technique to 20 gray in 5 fractions.  Narrative: The patient tolerated radiation treatment relatively well.   The patient still has some pain at the end of treatment but this was a abbreviated course in terms of treatment to the pelvis. No sign of acute toxicity.  Plan: The patient has completed radiation treatment. The patient will return to radiation oncology clinic for routine followup in one month. I advised the patient to call or return sooner if they have any questions or concerns related to their recovery or treatment. ________________________________  Radene Gunning, M.D., Ph.D.

## 2013-05-20 NOTE — Addendum Note (Signed)
Encounter addended by: Jonna Coup, MD on: 05/20/2013  8:01 AM<BR>     Documentation filed: Notes Section, Visit Diagnoses

## 2013-05-24 ENCOUNTER — Ambulatory Visit (HOSPITAL_BASED_OUTPATIENT_CLINIC_OR_DEPARTMENT_OTHER): Payer: Medicare Other

## 2013-05-24 ENCOUNTER — Other Ambulatory Visit (HOSPITAL_BASED_OUTPATIENT_CLINIC_OR_DEPARTMENT_OTHER): Payer: Medicare Other | Admitting: Lab

## 2013-05-24 VITALS — BP 106/62 | HR 108 | Temp 98.8°F

## 2013-05-24 DIAGNOSIS — Z5112 Encounter for antineoplastic immunotherapy: Secondary | ICD-10-CM

## 2013-05-24 DIAGNOSIS — C9002 Multiple myeloma in relapse: Secondary | ICD-10-CM

## 2013-05-24 DIAGNOSIS — R197 Diarrhea, unspecified: Secondary | ICD-10-CM

## 2013-05-24 DIAGNOSIS — C9000 Multiple myeloma not having achieved remission: Secondary | ICD-10-CM

## 2013-05-24 LAB — COMPREHENSIVE METABOLIC PANEL (CC13)
Albumin: 3.1 g/dL — ABNORMAL LOW (ref 3.5–5.0)
Alkaline Phosphatase: 77 U/L (ref 40–150)
BUN: 9.9 mg/dL (ref 7.0–26.0)
Calcium: 9 mg/dL (ref 8.4–10.4)
Chloride: 107 mEq/L (ref 98–109)
Creatinine: 0.8 mg/dL (ref 0.6–1.1)
Glucose: 127 mg/dl (ref 70–140)
Potassium: 3.6 mEq/L (ref 3.5–5.1)

## 2013-05-24 LAB — CBC WITH DIFFERENTIAL/PLATELET
Basophils Absolute: 0 10*3/uL (ref 0.0–0.1)
Eosinophils Absolute: 0.1 10*3/uL (ref 0.0–0.5)
HCT: 29.8 % — ABNORMAL LOW (ref 34.8–46.6)
HGB: 9.8 g/dL — ABNORMAL LOW (ref 11.6–15.9)
MCH: 32.6 pg (ref 25.1–34.0)
MCV: 98.7 fL (ref 79.5–101.0)
MONO%: 14.7 % — ABNORMAL HIGH (ref 0.0–14.0)
NEUT#: 2.9 10*3/uL (ref 1.5–6.5)
NEUT%: 59.5 % (ref 38.4–76.8)
RDW: 15.8 % — ABNORMAL HIGH (ref 11.2–14.5)
lymph#: 1.2 10*3/uL (ref 0.9–3.3)

## 2013-05-24 MED ORDER — DEXAMETHASONE SODIUM PHOSPHATE 10 MG/ML IJ SOLN
INTRAMUSCULAR | Status: AC
  Filled 2013-05-24: qty 1

## 2013-05-24 MED ORDER — SODIUM CHLORIDE 0.9 % IV SOLN
Freq: Once | INTRAVENOUS | Status: AC
  Administered 2013-05-24: 09:00:00 via INTRAVENOUS

## 2013-05-24 MED ORDER — HEPARIN SOD (PORK) LOCK FLUSH 100 UNIT/ML IV SOLN
500.0000 [IU] | Freq: Once | INTRAVENOUS | Status: AC | PRN
  Administered 2013-05-24: 500 [IU]
  Filled 2013-05-24: qty 5

## 2013-05-24 MED ORDER — DEXTROSE 5 % IV SOLN
27.0000 mg/m2 | Freq: Once | INTRAVENOUS | Status: AC
  Administered 2013-05-24: 56 mg via INTRAVENOUS
  Filled 2013-05-24: qty 28

## 2013-05-24 MED ORDER — SODIUM CHLORIDE 0.9 % IJ SOLN
10.0000 mL | INTRAMUSCULAR | Status: DC | PRN
  Administered 2013-05-24: 10 mL
  Filled 2013-05-24: qty 10

## 2013-05-24 MED ORDER — DEXAMETHASONE SODIUM PHOSPHATE 10 MG/ML IJ SOLN
10.0000 mg | Freq: Once | INTRAMUSCULAR | Status: AC
  Administered 2013-05-24: 10 mg via INTRAVENOUS

## 2013-05-24 MED ORDER — ONDANSETRON 8 MG/NS 50 ML IVPB
INTRAVENOUS | Status: AC
  Filled 2013-05-24: qty 8

## 2013-05-24 MED ORDER — ONDANSETRON 8 MG/50ML IVPB (CHCC)
8.0000 mg | Freq: Once | INTRAVENOUS | Status: AC
  Administered 2013-05-24: 8 mg via INTRAVENOUS

## 2013-05-24 NOTE — Patient Instructions (Signed)
[facility] Discharge Instructions for Patients Receiving Chemotherapy  Today you received the following chemotherapy agents Kyprolis To help prevent nausea and vomiting after your treatment, we encourage you to take your nausea medication as prescribed.  If you develop nausea and vomiting that is not controlled by your nausea medication, call the clinic.   BELOW ARE SYMPTOMS THAT SHOULD BE REPORTED IMMEDIATELY:  *FEVER GREATER THAN 100.5 F  *CHILLS WITH OR WITHOUT FEVER  NAUSEA AND VOMITING THAT IS NOT CONTROLLED WITH YOUR NAUSEA MEDICATION  *UNUSUAL SHORTNESS OF BREATH  *UNUSUAL BRUISING OR BLEEDING  TENDERNESS IN MOUTH AND THROAT WITH OR WITHOUT PRESENCE OF ULCERS  *URINARY PROBLEMS  *BOWEL PROBLEMS  UNUSUAL RASH Items with * indicate a potential emergency and should be followed up as soon as possible.  Feel free to call the clinic you have any questions or concerns. The clinic phone number is [phone].    

## 2013-05-25 ENCOUNTER — Ambulatory Visit (HOSPITAL_BASED_OUTPATIENT_CLINIC_OR_DEPARTMENT_OTHER): Payer: Medicare Other

## 2013-05-25 VITALS — BP 148/80 | HR 98 | Temp 98.5°F | Resp 18

## 2013-05-25 DIAGNOSIS — C9000 Multiple myeloma not having achieved remission: Secondary | ICD-10-CM

## 2013-05-25 DIAGNOSIS — Z5112 Encounter for antineoplastic immunotherapy: Secondary | ICD-10-CM

## 2013-05-25 MED ORDER — HEPARIN SOD (PORK) LOCK FLUSH 100 UNIT/ML IV SOLN
500.0000 [IU] | Freq: Once | INTRAVENOUS | Status: AC | PRN
  Administered 2013-05-25: 500 [IU]
  Filled 2013-05-25: qty 5

## 2013-05-25 MED ORDER — SODIUM CHLORIDE 0.9 % IV SOLN
Freq: Once | INTRAVENOUS | Status: AC
  Administered 2013-05-25: 09:00:00 via INTRAVENOUS

## 2013-05-25 MED ORDER — DEXTROSE 5 % IV SOLN
27.0000 mg/m2 | Freq: Once | INTRAVENOUS | Status: AC
  Administered 2013-05-25: 56 mg via INTRAVENOUS
  Filled 2013-05-25: qty 28

## 2013-05-25 MED ORDER — ONDANSETRON 8 MG/50ML IVPB (CHCC)
8.0000 mg | Freq: Once | INTRAVENOUS | Status: AC
  Administered 2013-05-25: 8 mg via INTRAVENOUS

## 2013-05-25 MED ORDER — DEXAMETHASONE SODIUM PHOSPHATE 10 MG/ML IJ SOLN
10.0000 mg | Freq: Once | INTRAMUSCULAR | Status: AC
  Administered 2013-05-25: 10 mg via INTRAVENOUS

## 2013-05-25 MED ORDER — SODIUM CHLORIDE 0.9 % IJ SOLN
10.0000 mL | INTRAMUSCULAR | Status: DC | PRN
  Administered 2013-05-25: 10 mL
  Filled 2013-05-25: qty 10

## 2013-05-25 MED ORDER — DEXAMETHASONE SODIUM PHOSPHATE 10 MG/ML IJ SOLN
INTRAMUSCULAR | Status: AC
  Filled 2013-05-25: qty 1

## 2013-05-25 MED ORDER — ONDANSETRON 8 MG/NS 50 ML IVPB
INTRAVENOUS | Status: AC
  Filled 2013-05-25: qty 8

## 2013-05-25 NOTE — Progress Notes (Signed)
Pt here for day 2 Kyprolis.  Pt stated she did not take Decadron 40 mg on day 1 as prescribed.  Dr. Arbutus Ped notified.  OK for pt to take Decadron 40 mg now while in infusion as per md.  Explanations given to pt and husband.  Both voiced understanding.

## 2013-05-25 NOTE — Patient Instructions (Signed)
[facility] Discharge Instructions for Patients Receiving Chemotherapy  Today you received the following chemotherapy agents :  Kyprolis.  To help prevent nausea and vomiting after your treatment, we encourage you to take your nausea medication as instructed by your physician.   If you develop nausea and vomiting that is not controlled by your nausea medication, call the clinic.   BELOW ARE SYMPTOMS THAT SHOULD BE REPORTED IMMEDIATELY:  *FEVER GREATER THAN 100.5 F  *CHILLS WITH OR WITHOUT FEVER  NAUSEA AND VOMITING THAT IS NOT CONTROLLED WITH YOUR NAUSEA MEDICATION  *UNUSUAL SHORTNESS OF BREATH  *UNUSUAL BRUISING OR BLEEDING  TENDERNESS IN MOUTH AND THROAT WITH OR WITHOUT PRESENCE OF ULCERS  *URINARY PROBLEMS  *BOWEL PROBLEMS  UNUSUAL RASH Items with * indicate a potential emergency and should be followed up as soon as possible.  Feel free to call the clinic you have any questions or concerns. The clinic phone number is [phone].    

## 2013-05-26 ENCOUNTER — Other Ambulatory Visit: Payer: Self-pay | Admitting: *Deleted

## 2013-05-26 MED ORDER — ATORVASTATIN CALCIUM 40 MG PO TABS: 40.0000 mg | ORAL_TABLET | Freq: Every day | ORAL | Status: DC

## 2013-05-31 ENCOUNTER — Other Ambulatory Visit: Payer: Self-pay | Admitting: Internal Medicine

## 2013-05-31 MED ORDER — POTASSIUM CHLORIDE CRYS ER 10 MEQ PO TBCR: 10.0000 meq | EXTENDED_RELEASE_TABLET | Freq: Two times a day (BID) | ORAL | Status: DC

## 2013-06-01 ENCOUNTER — Other Ambulatory Visit (HOSPITAL_BASED_OUTPATIENT_CLINIC_OR_DEPARTMENT_OTHER): Payer: Medicare Other | Admitting: Lab

## 2013-06-01 ENCOUNTER — Telehealth: Payer: Self-pay | Admitting: Internal Medicine

## 2013-06-01 ENCOUNTER — Other Ambulatory Visit: Payer: Self-pay | Admitting: Internal Medicine

## 2013-06-01 ENCOUNTER — Ambulatory Visit (HOSPITAL_BASED_OUTPATIENT_CLINIC_OR_DEPARTMENT_OTHER): Payer: Medicare Other | Admitting: Internal Medicine

## 2013-06-01 ENCOUNTER — Encounter: Payer: Self-pay | Admitting: Internal Medicine

## 2013-06-01 ENCOUNTER — Ambulatory Visit (HOSPITAL_BASED_OUTPATIENT_CLINIC_OR_DEPARTMENT_OTHER): Payer: Medicare Other

## 2013-06-01 VITALS — BP 142/76 | HR 103 | Temp 98.9°F | Resp 18 | Ht 66.0 in | Wt 201.9 lb

## 2013-06-01 DIAGNOSIS — R197 Diarrhea, unspecified: Secondary | ICD-10-CM

## 2013-06-01 DIAGNOSIS — C9000 Multiple myeloma not having achieved remission: Secondary | ICD-10-CM

## 2013-06-01 DIAGNOSIS — Z5112 Encounter for antineoplastic immunotherapy: Secondary | ICD-10-CM

## 2013-06-01 DIAGNOSIS — G609 Hereditary and idiopathic neuropathy, unspecified: Secondary | ICD-10-CM

## 2013-06-01 LAB — COMPREHENSIVE METABOLIC PANEL (CC13)
AST: 21 U/L (ref 5–34)
Alkaline Phosphatase: 136 U/L (ref 40–150)
BUN: 11.3 mg/dL (ref 7.0–26.0)
Creatinine: 0.8 mg/dL (ref 0.6–1.1)
Glucose: 103 mg/dl (ref 70–140)
Potassium: 4.2 mEq/L (ref 3.5–5.1)
Total Bilirubin: 0.63 mg/dL (ref 0.20–1.20)

## 2013-06-01 LAB — CBC WITH DIFFERENTIAL/PLATELET
Basophils Absolute: 0 10*3/uL (ref 0.0–0.1)
EOS%: 0.6 % (ref 0.0–7.0)
Eosinophils Absolute: 0.1 10*3/uL (ref 0.0–0.5)
HGB: 10.1 g/dL — ABNORMAL LOW (ref 11.6–15.9)
LYMPH%: 10.5 % — ABNORMAL LOW (ref 14.0–49.7)
MCH: 33.4 pg (ref 25.1–34.0)
MCV: 101.8 fL — ABNORMAL HIGH (ref 79.5–101.0)
MONO#: 0.6 10*3/uL (ref 0.1–0.9)
MONO%: 6.9 % (ref 0.0–14.0)
NEUT#: 7.5 10*3/uL — ABNORMAL HIGH (ref 1.5–6.5)
Platelets: 154 10*3/uL (ref 145–400)
RDW: 16.4 % — ABNORMAL HIGH (ref 11.2–14.5)
WBC: 9.2 10*3/uL (ref 3.9–10.3)

## 2013-06-01 MED ORDER — DEXAMETHASONE SODIUM PHOSPHATE 10 MG/ML IJ SOLN
INTRAMUSCULAR | Status: AC
  Filled 2013-06-01: qty 1

## 2013-06-01 MED ORDER — ONDANSETRON 8 MG/50ML IVPB (CHCC)
8.0000 mg | Freq: Once | INTRAVENOUS | Status: AC
  Administered 2013-06-01: 8 mg via INTRAVENOUS

## 2013-06-01 MED ORDER — ONDANSETRON 8 MG/NS 50 ML IVPB
INTRAVENOUS | Status: AC
  Filled 2013-06-01: qty 8

## 2013-06-01 MED ORDER — DEXTROSE 5 % IV SOLN
27.0000 mg/m2 | Freq: Once | INTRAVENOUS | Status: AC
  Administered 2013-06-01: 56 mg via INTRAVENOUS
  Filled 2013-06-01: qty 28

## 2013-06-01 MED ORDER — SODIUM CHLORIDE 0.9 % IV SOLN
Freq: Once | INTRAVENOUS | Status: AC
  Administered 2013-06-01: 11:00:00 via INTRAVENOUS

## 2013-06-01 MED ORDER — HEPARIN SOD (PORK) LOCK FLUSH 100 UNIT/ML IV SOLN
500.0000 [IU] | Freq: Once | INTRAVENOUS | Status: AC | PRN
  Administered 2013-06-01: 500 [IU]
  Filled 2013-06-01: qty 5

## 2013-06-01 MED ORDER — DEXAMETHASONE SODIUM PHOSPHATE 10 MG/ML IJ SOLN
10.0000 mg | Freq: Once | INTRAMUSCULAR | Status: AC
  Administered 2013-06-01: 10 mg via INTRAVENOUS

## 2013-06-01 MED ORDER — SODIUM CHLORIDE 0.9 % IJ SOLN
10.0000 mL | INTRAMUSCULAR | Status: DC | PRN
  Administered 2013-06-01: 10 mL
  Filled 2013-06-01: qty 10

## 2013-06-01 NOTE — Progress Notes (Signed)
[facility] [phone]   [phone]  OFFICE PROGRESS NOTE  [name], [name], R, [name] [address] [place] [address]  DIAGNOSIS: Recurrent multiple myeloma initially diagnosed in October 2008.   PRIOR THERAPY:  1. Status post palliative radiotherapy to the lumbar spine between L3 and L5. The patient received a total dose of 3000 cGy in 10 fractions under the care of Dr. Mitzi Hansen between May 05, 2007 through May 18, 2007. 2. Status post 5 cycles of systemic chemotherapy with Revlimid and low-dose Decadron with good response to this treatment. 3. Status post autologous peripheral blood stem cell transplant at [facility] on Nov 20, 2007 under the care of Dr. Vicente Serene. 4. Status post treatment for disease recurrence with Velcade, Doxil and Decadron. Last dose given Nov 09, 2009. Discontinued secondary to intolerance but the patient had a good response to treatment at that time. 5. Status post palliative radiotherapy to the T2-T6 thoracic vertebrae completed 03/21/2011 under the care of Dr. Mitzi Hansen. 6. Systemic chemotherapy with Velcade 1.3 mg per meter squared given on days 1, 4, 8 and 11, and Doxil at 30 mg per meter squared given on day 4 in addition to Decadron status post 4 cycles, discontinued secondary to intolerance. 7. Systemic therapy with Velcade 1.3 mg/M2 subcutaneously in addition to Decadron 40 mg by mouth on a weekly basis, status post 20 cycles. The patient had good response with this treatment but it is discontinue today secondary to worsening peripheral neuropathy. 8. Palliative radiotherapy to the skull lesion as well as the left hip area under the care of Dr. Mitzi Hansen  CURRENT THERAPY:   1. Systemic chemotherapy with Carfilzomib 20 mg/M2 on days 1, 2,  8, 9, 13 and 16 every 4 weeks in addition to weekly Decadron 40 mg by mouth. First dose on 04/19/2013. She is here today for day 15 of the second cycle. 2. Zometa 4 mg IV  given every 3 months   INTERVAL HISTORY: [name] 62 y.o. female returns to the clinic today for follow up visit accompanied by her daughter. The patient is feeling fine today except for the aching pain on the lower back as well as the peripheral neuropathy in the lower extremities. She still on Neurontin 600 mg by mouth 4 times a day. She enjoyed the [place] time at the beach with her family. She is tolerating her treatment with Carfilzomib, and Decadron fairly well. She denied having any significant weight loss or night sweats. The patient denied having any nausea or vomiting. She has no chest pain, shortness of breath, cough or hemoptysis. She is here today for day 15 of the second cycle.  MEDICAL HISTORY: Past Medical History  Diagnosis Date  . Thyroid disease Hypothyroidism  . Hypercholesterolemia   . Compression fracture 04/08/2007    pathologic compression fracture  . Hypothyroidism   . FHx: chemotherapy     s/p 5 cycle revlimid/low dose decadron,s/p velcade,doxil,decadron,  . Hx of radiation therapy 05/05/07-05/18/07,& 03/05/11-03/21/11-    l3&l5 in 2008, t2-t6 03/2011  . GERD (gastroesophageal reflux disease)   . Insomnia     associated with steroids  . Nausea   . Constipation     takes oxycontin,vicodin  . Hx of radiation therapy 05/05/2007 to 05/18/2007    palliative, L3-5  . 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 Hx of radiation therapy 03/05/2011 to 03/21/2011    palliative T2-T6, c-spine  . History of  autologous stem cell transplant 11/20/2007    [facility], Dr Vicente Serene  . PONV (postoperative nausea and vomiting)   . Metastasis to bone     ALLERGIES:  has No Known Allergies.  MEDICATIONS:  Current Outpatient Prescriptions  Medication Sig Dispense Refill  . acyclovir (ZOVIRAX) 400 MG tablet Take 1 tablet (400 mg total) by mouth daily.  30 tablet  3  . acyclovir (ZOVIRAX) 800 MG tablet Take 800 mg by mouth.      . ALPRAZolam (XANAX) 0.5 MG tablet Take 0.5 mg by mouth  3 (three) times daily as needed for anxiety.       Marland Kitchen atorvastatin (LIPITOR) 40 MG tablet Take 1 tablet (40 mg total) by mouth daily.  30 tablet  3  . clopidogrel (PLAVIX) 75 MG tablet Take 1 tablet (75 mg total) by mouth daily with breakfast.  30 tablet  1  . cyclobenzaprine (FLEXERIL) 10 MG tablet 10 mg.      . dexamethasone (DECADRON) 4 MG tablet Take 40 mg by mouth once a week. Taken on Thursdays.      Marland Kitchen dicyclomine (BENTYL) 20 MG tablet Take 20 mg by mouth 2 (two) times daily.       Marland Kitchen ESZOPICLONE 3 MG tablet Take 3 mg by mouth at bedtime.       . furosemide (LASIX) 40 MG tablet Take 40 mg by mouth 2 (two) times daily.      Marland Kitchen gabapentin (NEURONTIN) 300 MG capsule Take 600 mg by mouth 4 (four) times daily.       Marland Kitchen guaiFENesin (MUCINEX) 600 MG 12 hr tablet Take 1 tablet (600 mg total) by mouth 2 (two) times daily as needed for cough.  20 tablet  0  . hyaluronate sodium (RADIAPLEXRX) GEL Apply 1 application topically 2 (two) times daily. Apply to affected skin area after rad txs and bedtime      . HYDROcodone-acetaminophen (NORCO) 7.5-325 MG per tablet Take 1 tablet by mouth every 6 (six) hours as needed.  30 tablet  0  . levothyroxine (SYNTHROID, LEVOTHROID) 100 MCG tablet Take 100 mcg by mouth daily.       Marland Kitchen lidocaine-prilocaine (EMLA) cream Apply 1 application topically as needed (for port-a-cath access).      Marland Kitchen loperamide (IMODIUM) 2 MG capsule Take 2 mg by mouth 4 (four) times daily as needed for diarrhea or loose stools.      . metFORMIN (GLUCOPHAGE) 500 MG tablet Take 500 mg by mouth daily with breakfast.      . morphine (MS CONTIN) 15 MG 12 hr tablet Take 1 tablet (15 mg total) by mouth 2 (two) times daily.  60 tablet  0  . Multiple Vitamin (MULITIVITAMIN WITH MINERALS) TABS Take 1 tablet by mouth daily.      . ondansetron (ZOFRAN-ODT) 4 MG disintegrating tablet Take 1 tablet by mouth every 8 (eight) hours as needed for nausea.       . pantoprazole (PROTONIX) 40 MG tablet Take 40 mg by  mouth daily.      . potassium chloride (K-DUR,KLOR-CON) 10 MEQ tablet Take 1 tablet (10 mEq total) by mouth 2 (two) times daily.  60 tablet  3  . PRESCRIPTION MEDICATION Inject into the vein every 7 (seven) days. Velcade infusion every Thursday.       No current facility-administered medications for this visit.   Facility-Administered Medications Ordered in Other Visits  Medication Dose Route Frequency Provider Last Rate Last Dose  . 0.9 %  sodium chloride  infusion   Intravenous Once Si Gaul, MD      . ondansetron (ZOFRAN) IVPB 8 mg  8 mg Intravenous Once Si Gaul, MD      . sodium chloride 0.9 % injection 10 mL  10 mL Intracatheter PRN Si Gaul, MD        SURGICAL HISTORY:  Past Surgical History  Procedure Laterality Date  . Cholecystectomy    . Knee surgery    . Knee surgery    . Video bronchoscopy  07/30/2011    Procedure: VIDEO BRONCHOSCOPY WITHOUT FLUORO;  Surgeon: Barbaraann Share, MD;  Location: [facility];  Service: Cardiopulmonary;  Laterality: Bilateral;    REVIEW OF SYSTEMS:  Constitutional: negative Eyes: negative Ears, nose, mouth, throat, and face: negative Respiratory: negative Cardiovascular: negative Gastrointestinal: negative Genitourinary:negative Integument/breast: negative Hematologic/lymphatic: negative Musculoskeletal:positive for back pain Neurological: positive for seizures Behavioral/Psych: negative Endocrine: negative Allergic/Immunologic: negative   PHYSICAL EXAMINATION: General appearance: alert, cooperative, fatigued and no distress Head: Normocephalic, without obvious abnormality, atraumatic Neck: no adenopathy, no JVD, supple, symmetrical, trachea midline and thyroid not enlarged, symmetric, no tenderness/mass/nodules Lymph nodes: Cervical, supraclavicular, and axillary nodes normal. Resp: clear to auscultation bilaterally Back: symmetric, no curvature. ROM normal. No CVA tenderness. Cardio: regular rate and rhythm, S1,  S2 normal, no murmur, click, rub or gallop GI: soft, non-tender; bowel sounds normal; no masses,  no organomegaly Extremities: extremities normal, atraumatic, no cyanosis or edema Neurologic: Alert and oriented X 3, normal strength and tone. Normal symmetric reflexes. Normal coordination and gait  ECOG PERFORMANCE STATUS: 1 - Symptomatic but completely ambulatory  Blood pressure 142/76, pulse 103, temperature 98.9 F (37.2 C), temperature source Oral, resp. rate 18, height 5\' 6"  (1.676 m), weight 201 lb 14.4 oz (91.581 kg), SpO2 100.00%.  LABORATORY DATA: Lab Results  Component Value Date   WBC 9.2 06/01/2013   HGB 10.1* 06/01/2013   HCT 30.8* 06/01/2013   MCV 101.8* 06/01/2013   PLT 154 06/01/2013      Chemistry      Component Value Date/Time   NA 141 05/24/2013 0814   NA 138 05/04/2013 0530   K 3.6 05/24/2013 0814   K 3.9 05/04/2013 0530   CL 105 05/04/2013 0530   CL 105 12/17/2012 1015   CO2 23 05/24/2013 0814   CO2 25 05/04/2013 0530   BUN 9.9 05/24/2013 0814   BUN 9 05/04/2013 0530   CREATININE 0.8 05/24/2013 0814   CREATININE 0.68 05/04/2013 0530      Component Value Date/Time   CALCIUM 9.0 05/24/2013 0814   CALCIUM 8.7 05/04/2013 0530   ALKPHOS 77 05/24/2013 0814   ALKPHOS 78 02/24/2013 1500   AST 11 05/24/2013 0814   AST 22 02/24/2013 1500   ALT 12 05/24/2013 0814   ALT 26 02/24/2013 1500   BILITOT 0.63 05/24/2013 0814   BILITOT 0.4 02/24/2013 1500       RADIOGRAPHIC STUDIES:  ASSESSMENT AND PLAN:  This is a very pleasant 62 years old [place] American female with recurrent multiple myeloma recently completed a course of treatment with Velcade and Decadron with improvement in her disease but this was discontinued secondary to peripheral neuropathy. The patient is tolerating her treatment with Carfilzomib and Decadron fairly well. We will proceed with day 15 of the second cycle as scheduled. The patient would come back for followup visit in 2 weeks with repeat myeloma  panel for evaluation of her disease and response to the treatment. She was advised to call immediately if she  has any concerning symptoms in the interval. The patient voices understanding of current disease status and treatment options and is in agreement with the current care plan.  All questions were answered. The patient knows to call the clinic with any problems, questions or concerns. We can certainly see the patient much sooner if necessary.

## 2013-06-01 NOTE — Patient Instructions (Signed)
Followup visit in 2 weeks after repeating myeloma panel.

## 2013-06-01 NOTE — Telephone Encounter (Signed)
appts complete pt given schedule in infusion.

## 2013-06-02 ENCOUNTER — Ambulatory Visit
Admission: RE | Admit: 2013-06-02 | Discharge: 2013-06-02 | Disposition: A | Discharge status: Home / Self Care | Payer: Medicare Other | Source: Ambulatory Visit | Attending: Radiation Oncology | Admitting: Radiation Oncology

## 2013-06-02 ENCOUNTER — Ambulatory Visit (HOSPITAL_BASED_OUTPATIENT_CLINIC_OR_DEPARTMENT_OTHER): Payer: Medicare Other

## 2013-06-02 VITALS — BP 147/73 | HR 103 | Temp 98.5°F | Resp 20

## 2013-06-02 VITALS — BP 143/73 | HR 84 | Temp 98.4°F | Wt 201.9 lb

## 2013-06-02 DIAGNOSIS — C9002 Multiple myeloma in relapse: Secondary | ICD-10-CM

## 2013-06-02 DIAGNOSIS — Z5112 Encounter for antineoplastic immunotherapy: Secondary | ICD-10-CM

## 2013-06-02 DIAGNOSIS — C9000 Multiple myeloma not having achieved remission: Secondary | ICD-10-CM

## 2013-06-02 MED ORDER — SODIUM CHLORIDE 0.9 % IJ SOLN
10.0000 mL | INTRAMUSCULAR | Status: DC | PRN
  Administered 2013-06-02: 10 mL
  Filled 2013-06-02: qty 10

## 2013-06-02 MED ORDER — DEXAMETHASONE SODIUM PHOSPHATE 10 MG/ML IJ SOLN
10.0000 mg | Freq: Once | INTRAMUSCULAR | Status: AC
  Administered 2013-06-02: 10 mg via INTRAVENOUS

## 2013-06-02 MED ORDER — HEPARIN SOD (PORK) LOCK FLUSH 100 UNIT/ML IV SOLN
500.0000 [IU] | Freq: Once | INTRAVENOUS | Status: AC | PRN
  Administered 2013-06-02: 500 [IU]
  Filled 2013-06-02: qty 5

## 2013-06-02 MED ORDER — ONDANSETRON 8 MG/50ML IVPB (CHCC)
8.0000 mg | Freq: Once | INTRAVENOUS | Status: AC
  Administered 2013-06-02: 8 mg via INTRAVENOUS

## 2013-06-02 MED ORDER — ZOLEDRONIC ACID 4 MG/100ML IV SOLN
4.0000 mg | Freq: Once | INTRAVENOUS | Status: AC
  Administered 2013-06-02: 4 mg via INTRAVENOUS
  Filled 2013-06-02: qty 100

## 2013-06-02 MED ORDER — SODIUM CHLORIDE 0.9 % IV SOLN
Freq: Once | INTRAVENOUS | Status: AC
  Administered 2013-06-02: 10:00:00 via INTRAVENOUS

## 2013-06-02 MED ORDER — ONDANSETRON 8 MG/NS 50 ML IVPB
INTRAVENOUS | Status: AC
  Filled 2013-06-02: qty 8

## 2013-06-02 MED ORDER — DEXTROSE 5 % IV SOLN
27.0000 mg/m2 | Freq: Once | INTRAVENOUS | Status: AC
  Administered 2013-06-02: 56 mg via INTRAVENOUS
  Filled 2013-06-02: qty 28

## 2013-06-02 MED ORDER — DEXAMETHASONE SODIUM PHOSPHATE 10 MG/ML IJ SOLN
INTRAMUSCULAR | Status: AC
  Filled 2013-06-02: qty 1

## 2013-06-02 NOTE — Progress Notes (Signed)
Radiation Oncology         [phone] ________________________________  Name: [name] MRN: [id number]  Date: 06/02/2013  DOB: [birth year]  Follow-Up Visit Note  CC: [name], [name], R, [name]  [name] R, [name]  Diagnosis:   Multiple myeloma  Interval Since Last Radiation:  Approximately 6 weeks   Narrative:  The patient returns today for routine follow-up.  The patient was seen today in medical oncology. She has an appointment with [name] tomorrow but wanted to be seen briefly today if possible. She states that she is doing well. She relates no major increase in terms of skin irritation, headaches, nausea. No pelvic pain. She is doing well with chemotherapy she states.                              ALLERGIES:  has No Known Allergies.  Meds: Current Outpatient Prescriptions  Medication Sig Dispense Refill  . acyclovir (ZOVIRAX) 400 MG tablet Take 1 tablet (400 mg total) by mouth daily.  30 tablet  3  . acyclovir (ZOVIRAX) 800 MG tablet Take 800 mg by mouth.      . ALPRAZolam (XANAX) 0.5 MG tablet Take 0.5 mg by mouth 3 (three) times daily as needed for anxiety.       Marland Kitchen atorvastatin (LIPITOR) 40 MG tablet Take 1 tablet (40 mg total) by mouth daily.  30 tablet  3  . clopidogrel (PLAVIX) 75 MG tablet Take 1 tablet (75 mg total) by mouth daily with breakfast.  30 tablet  1  . cyclobenzaprine (FLEXERIL) 10 MG tablet 10 mg.      . dexamethasone (DECADRON) 4 MG tablet Take 40 mg by mouth once a week. Taken on Thursdays.      Marland Kitchen dexamethasone (DECADRON) 4 MG tablet TAKE 10 TABLETS BY MOUTH ON A WEEKLY BASIS STARTING WITH THE FIRST CYCLE OF CHEMOTHERAPY.  80 tablet  2  . dicyclomine (BENTYL) 20 MG tablet Take 20 mg by mouth 2 (two) times daily.       Marland Kitchen ESZOPICLONE 3 MG tablet Take 3 mg by mouth at bedtime.       . furosemide (LASIX) 40 MG tablet Take 40 mg by mouth 2 (two) times daily.      Marland Kitchen gabapentin (NEURONTIN) 300 MG capsule Take 600 mg by mouth 4 (four) times daily.       Marland Kitchen  guaiFENesin (MUCINEX) 600 MG 12 hr tablet Take 1 tablet (600 mg total) by mouth 2 (two) times daily as needed for cough.  20 tablet  0  . hyaluronate sodium (RADIAPLEXRX) GEL Apply 1 application topically 2 (two) times daily. Apply to affected skin area after rad txs and bedtime      . HYDROcodone-acetaminophen (NORCO) 7.5-325 MG per tablet Take 1 tablet by mouth every 6 (six) hours as needed.  30 tablet  0  . levothyroxine (SYNTHROID, LEVOTHROID) 100 MCG tablet Take 100 mcg by mouth daily.       Marland Kitchen lidocaine-prilocaine (EMLA) cream Apply 1 application topically as needed (for port-a-cath access).      Marland Kitchen loperamide (IMODIUM) 2 MG capsule Take 2 mg by mouth 4 (four) times daily as needed for diarrhea or loose stools.      . metFORMIN (GLUCOPHAGE) 500 MG tablet Take 500 mg by mouth daily with breakfast.      . morphine (MS CONTIN) 15 MG 12 hr tablet Take 1 tablet (15 mg total) by mouth  2 (two) times daily.  60 tablet  0  . Multiple Vitamin (MULITIVITAMIN WITH MINERALS) TABS Take 1 tablet by mouth daily.      . ondansetron (ZOFRAN-ODT) 4 MG disintegrating tablet Take 1 tablet by mouth every 8 (eight) hours as needed for nausea.       . pantoprazole (PROTONIX) 40 MG tablet Take 40 mg by mouth daily.      . potassium chloride (K-DUR,KLOR-CON) 10 MEQ tablet Take 1 tablet (10 mEq total) by mouth 2 (two) times daily.  60 tablet  3  . PRESCRIPTION MEDICATION Inject into the vein every 7 (seven) days. Velcade infusion every Thursday.       No current facility-administered medications for this encounter.   Facility-Administered Medications Ordered in Other Encounters  Medication Dose Route Frequency Provider Last Rate Last Dose  . 0.9 %  sodium chloride infusion   Intravenous Once Si Gaul, MD      . ondansetron (ZOFRAN) IVPB 8 mg  8 mg Intravenous Once Si Gaul, MD      . sodium chloride 0.9 % injection 10 mL  10 mL Intracatheter PRN Si Gaul, MD      . sodium chloride 0.9 % injection  10 mL  10 mL Intracatheter PRN Si Gaul, MD   10 mL at 06/02/13 1103    Physical Findings: The patient is in no acute distress. Patient is alert and oriented.  weight is 201 lb 14.4 oz (91.581 kg). Her temperature is 98.4 F (36.9 C). Her blood pressure is 143/73 and her pulse is 84. Her oxygen saturation is 100%. .     Lab Findings: Lab Results  Component Value Date   WBC 9.2 06/01/2013   HGB 10.1* 06/01/2013   HCT 30.8* 06/01/2013   MCV 101.8* 06/01/2013   PLT 154 06/01/2013     Radiographic Findings: No results found.  Impression:    The patient did well with treatment and has done satisfactorily since she completed this approximately 5 weeks ago.  Plan:  I discussed possible treatment options with her. He states that she would like to continue close followup with Dr. Shirline Frees and see [name] as needed.   Radene Gunning, M.D., Ph.D.

## 2013-06-02 NOTE — Progress Notes (Signed)
Patient for one month follow up completion of radiation to pelvis and skull.Denies pain.Has occasional nausea but has medication for this.Currently under treat for systemic chemotherapy and zometa.

## 2013-06-02 NOTE — Patient Instructions (Signed)
[facility] Discharge Instructions for Patients Receiving Chemotherapy  Today you received the following chemotherapy agents Kyprolis To help prevent nausea and vomiting after your treatment, we encourage you to take your nausea medication as prescribed.  If you develop nausea and vomiting that is not controlled by your nausea medication, call the clinic.   BELOW ARE SYMPTOMS THAT SHOULD BE REPORTED IMMEDIATELY:  *FEVER GREATER THAN 100.5 F  *CHILLS WITH OR WITHOUT FEVER  NAUSEA AND VOMITING THAT IS NOT CONTROLLED WITH YOUR NAUSEA MEDICATION  *UNUSUAL SHORTNESS OF BREATH  *UNUSUAL BRUISING OR BLEEDING  TENDERNESS IN MOUTH AND THROAT WITH OR WITHOUT PRESENCE OF ULCERS  *URINARY PROBLEMS  *BOWEL PROBLEMS  UNUSUAL RASH Items with * indicate a potential emergency and should be followed up as soon as possible.  Feel free to call the clinic you have any questions or concerns. The clinic phone number is [phone].    

## 2013-06-03 ENCOUNTER — Ambulatory Visit: Payer: Medicare Other | Admitting: Radiation Oncology

## 2013-06-08 ENCOUNTER — Other Ambulatory Visit (HOSPITAL_BASED_OUTPATIENT_CLINIC_OR_DEPARTMENT_OTHER): Payer: Medicare Other | Admitting: Lab

## 2013-06-08 DIAGNOSIS — C9000 Multiple myeloma not having achieved remission: Secondary | ICD-10-CM

## 2013-06-08 LAB — COMPREHENSIVE METABOLIC PANEL (CC13)
ALT: 26 U/L (ref 0–55)
AST: 12 U/L (ref 5–34)
Anion Gap: 14 mEq/L — ABNORMAL HIGH (ref 3–11)
BUN: 13.8 mg/dL (ref 7.0–26.0)
CO2: 26 mEq/L (ref 22–29)
Calcium: 10.2 mg/dL (ref 8.4–10.4)
Chloride: 102 mEq/L (ref 98–109)
Creatinine: 0.9 mg/dL (ref 0.6–1.1)
Total Protein: 7.5 g/dL (ref 6.4–8.3)

## 2013-06-08 LAB — CBC WITH DIFFERENTIAL/PLATELET
BASO%: 0.3 % (ref 0.0–2.0)
Basophils Absolute: 0 10*3/uL (ref 0.0–0.1)
EOS%: 1.1 % (ref 0.0–7.0)
HCT: 35 % (ref 34.8–46.6)
HGB: 11.6 g/dL (ref 11.6–15.9)
LYMPH%: 12 % — ABNORMAL LOW (ref 14.0–49.7)
MCH: 33.5 pg (ref 25.1–34.0)
MONO#: 0.7 10*3/uL (ref 0.1–0.9)
NEUT#: 5.3 10*3/uL (ref 1.5–6.5)
NEUT%: 77.2 % — ABNORMAL HIGH (ref 38.4–76.8)
Platelets: 178 10*3/uL (ref 145–400)
lymph#: 0.8 10*3/uL — ABNORMAL LOW (ref 0.9–3.3)

## 2013-06-09 ENCOUNTER — Telehealth: Payer: Self-pay | Admitting: *Deleted

## 2013-06-09 LAB — KAPPA/LAMBDA LIGHT CHAINS
Kappa free light chain: 1.83 mg/dL (ref 0.33–1.94)
Lambda Free Lght Chn: 6 mg/dL — ABNORMAL HIGH (ref 0.57–2.63)

## 2013-06-09 LAB — IGG, IGA, IGM
IgA: 162 mg/dL (ref 69–380)
IgG (Immunoglobin G), Serum: 1060 mg/dL (ref 690–1700)

## 2013-06-09 LAB — BETA 2 MICROGLOBULIN, SERUM: Beta-2 Microglobulin: 3.02 mg/L — ABNORMAL HIGH (ref 1.01–1.73)

## 2013-06-09 NOTE — Telephone Encounter (Signed)
Pt called and stated having cramping on left side of head and body.  States right side of body feels cold.  Per Dr Oneta Rack, pt needs to go to ED.  Pt aware and states she will go.

## 2013-06-11 ENCOUNTER — Other Ambulatory Visit (HOSPITAL_BASED_OUTPATIENT_CLINIC_OR_DEPARTMENT_OTHER): Payer: Medicare Other

## 2013-06-11 ENCOUNTER — Telehealth: Payer: Self-pay | Admitting: Medical Oncology

## 2013-06-11 DIAGNOSIS — C9000 Multiple myeloma not having achieved remission: Secondary | ICD-10-CM

## 2013-06-11 DIAGNOSIS — R197 Diarrhea, unspecified: Secondary | ICD-10-CM

## 2013-06-11 LAB — BASIC METABOLIC PANEL (CC13)
BUN: 9.5 mg/dL (ref 7.0–26.0)
CO2: 20 mEq/L — ABNORMAL LOW (ref 22–29)
Chloride: 108 mEq/L (ref 98–109)
Creatinine: 0.9 mg/dL (ref 0.6–1.1)
Glucose: 115 mg/dl (ref 70–140)
Potassium: 3.6 mEq/L (ref 3.5–5.1)
Sodium: 141 mEq/L (ref 136–145)

## 2013-06-11 LAB — CBC WITH DIFFERENTIAL/PLATELET
BASO%: 0.2 % (ref 0.0–2.0)
EOS%: 0.6 % (ref 0.0–7.0)
HCT: 30.5 % — ABNORMAL LOW (ref 34.8–46.6)
LYMPH%: 13.8 % — ABNORMAL LOW (ref 14.0–49.7)
MCH: 34.9 pg — ABNORMAL HIGH (ref 25.1–34.0)
MCHC: 34.5 g/dL (ref 31.5–36.0)
MCV: 101.3 fL — ABNORMAL HIGH (ref 79.5–101.0)
MONO#: 0.7 10*3/uL (ref 0.1–0.9)
NEUT%: 75.2 % (ref 38.4–76.8)
Platelets: 180 10*3/uL (ref 145–400)
RDW: 16.9 % — ABNORMAL HIGH (ref 11.2–14.5)
WBC: 7.1 10*3/uL (ref 3.9–10.3)

## 2013-06-11 NOTE — Telephone Encounter (Signed)
Returned pt call and she said she was returning a call. I told her I did not know who called and and confirmed next appts.

## 2013-06-14 NOTE — Addendum Note (Signed)
Encounter addended by: Jonna Coup, MD on: 06/14/2013  6:35 PM<BR>     Documentation filed: Notes Section

## 2013-06-15 ENCOUNTER — Encounter: Payer: Self-pay | Admitting: Physician Assistant

## 2013-06-15 ENCOUNTER — Other Ambulatory Visit (HOSPITAL_BASED_OUTPATIENT_CLINIC_OR_DEPARTMENT_OTHER): Payer: Medicare Other

## 2013-06-15 ENCOUNTER — Ambulatory Visit (HOSPITAL_BASED_OUTPATIENT_CLINIC_OR_DEPARTMENT_OTHER): Payer: Medicare Other

## 2013-06-15 ENCOUNTER — Ambulatory Visit (HOSPITAL_BASED_OUTPATIENT_CLINIC_OR_DEPARTMENT_OTHER): Payer: Medicare Other | Admitting: Physician Assistant

## 2013-06-15 VITALS — BP 122/63 | HR 109 | Temp 98.9°F | Resp 18 | Ht 66.0 in | Wt 198.1 lb

## 2013-06-15 DIAGNOSIS — C9002 Multiple myeloma in relapse: Secondary | ICD-10-CM

## 2013-06-15 DIAGNOSIS — C9000 Multiple myeloma not having achieved remission: Secondary | ICD-10-CM

## 2013-06-15 DIAGNOSIS — G5790 Unspecified mononeuropathy of unspecified lower limb: Secondary | ICD-10-CM

## 2013-06-15 DIAGNOSIS — Z5112 Encounter for antineoplastic immunotherapy: Secondary | ICD-10-CM

## 2013-06-15 LAB — COMPREHENSIVE METABOLIC PANEL (CC13)
AST: 14 U/L (ref 5–34)
Alkaline Phosphatase: 117 U/L (ref 40–150)
BUN: 10.3 mg/dL (ref 7.0–26.0)
Calcium: 9.5 mg/dL (ref 8.4–10.4)
Chloride: 108 mEq/L (ref 98–109)
Creatinine: 1.1 mg/dL (ref 0.6–1.1)
Glucose: 126 mg/dl (ref 70–140)
Total Bilirubin: 0.78 mg/dL (ref 0.20–1.20)

## 2013-06-15 LAB — CBC WITH DIFFERENTIAL/PLATELET
BASO%: 0.2 % (ref 0.0–2.0)
EOS%: 0.9 % (ref 0.0–7.0)
HCT: 33.9 % — ABNORMAL LOW (ref 34.8–46.6)
LYMPH%: 15.8 % (ref 14.0–49.7)
MCH: 32.6 pg (ref 25.1–34.0)
MCHC: 32.4 g/dL (ref 31.5–36.0)
NEUT#: 6.7 10*3/uL — ABNORMAL HIGH (ref 1.5–6.5)
NEUT%: 74.7 % (ref 38.4–76.8)
Platelets: 268 10*3/uL (ref 145–400)
RBC: 3.37 10*6/uL — ABNORMAL LOW (ref 3.70–5.45)
nRBC: 0 % (ref 0–0)

## 2013-06-15 MED ORDER — HEPARIN SOD (PORK) LOCK FLUSH 100 UNIT/ML IV SOLN
500.0000 [IU] | Freq: Once | INTRAVENOUS | Status: AC | PRN
  Administered 2013-06-15: 500 [IU]
  Filled 2013-06-15: qty 5

## 2013-06-15 MED ORDER — DEXAMETHASONE SODIUM PHOSPHATE 10 MG/ML IJ SOLN
10.0000 mg | Freq: Once | INTRAMUSCULAR | Status: AC
  Administered 2013-06-15: 10 mg via INTRAVENOUS

## 2013-06-15 MED ORDER — DEXTROSE 5 % IV SOLN
27.0000 mg/m2 | Freq: Once | INTRAVENOUS | Status: AC
  Administered 2013-06-15: 56 mg via INTRAVENOUS
  Filled 2013-06-15: qty 28

## 2013-06-15 MED ORDER — SODIUM CHLORIDE 0.9 % IJ SOLN
10.0000 mL | INTRAMUSCULAR | Status: DC | PRN
  Administered 2013-06-15: 10 mL
  Filled 2013-06-15: qty 10

## 2013-06-15 MED ORDER — ONDANSETRON 8 MG/NS 50 ML IVPB
INTRAVENOUS | Status: AC
  Filled 2013-06-15: qty 8

## 2013-06-15 MED ORDER — ONDANSETRON 8 MG/50ML IVPB (CHCC)
8.0000 mg | Freq: Once | INTRAVENOUS | Status: AC
  Administered 2013-06-15: 8 mg via INTRAVENOUS

## 2013-06-15 MED ORDER — SODIUM CHLORIDE 0.9 % IV SOLN
Freq: Once | INTRAVENOUS | Status: AC
  Administered 2013-06-15: 12:00:00 via INTRAVENOUS

## 2013-06-15 MED ORDER — DEXAMETHASONE SODIUM PHOSPHATE 10 MG/ML IJ SOLN
INTRAMUSCULAR | Status: AC
  Filled 2013-06-15: qty 1

## 2013-06-15 NOTE — Progress Notes (Addendum)
[facility] Telephone:([phone]   [phone]  SHARED VISIT PROGRESS NOTE  [name], [name], R, [name] [address] [place] [address]  DIAGNOSIS: Recurrent multiple myeloma initially diagnosed in October 2008.   PRIOR THERAPY:  1. Status post palliative radiotherapy to the lumbar spine between L3 and L5. The patient received a total dose of 3000 cGy in 10 fractions under the care of Dr. Mitzi Hansen between May 05, 2007 through May 18, 2007. 2. Status post 5 cycles of systemic chemotherapy with Revlimid and low-dose Decadron with good response to this treatment. 3. Status post autologous peripheral blood stem cell transplant at [facility] on Nov 20, 2007 under the care of Dr. Vicente Serene. 4. Status post treatment for disease recurrence with Velcade, Doxil and Decadron. Last dose given Nov 09, 2009. Discontinued secondary to intolerance but the patient had a good response to treatment at that time. 5. Status post palliative radiotherapy to the T2-T6 thoracic vertebrae completed 03/21/2011 under the care of Dr. Mitzi Hansen. 6. Systemic chemotherapy with Velcade 1.3 mg per meter squared given on days 1, 4, 8 and 11, and Doxil at 30 mg per meter squared given on day 4 in addition to Decadron status post 4 cycles, discontinued secondary to intolerance. 7. Systemic therapy with Velcade 1.3 mg/M2 subcutaneously in addition to Decadron 40 mg by mouth on a weekly basis, status post 20 cycles. The patient had good response with this treatment but it is discontinue today secondary to worsening peripheral neuropathy. 8. Palliative radiotherapy to the skull lesion as well as the left hip area under the care of Dr. Mitzi Hansen  CURRENT THERAPY:   1. Systemic chemotherapy with Carfilzomib 20 mg/M2 on days 1, 2,  8, 9, 13 and 16 every 4 weeks in addition to weekly Decadron 40 mg by mouth. First dose on 04/19/2013.  Status post 2 cycles. 2. Zometa 4 mg IV given every 3  months   INTERVAL HISTORY: [name] 62 y.o. female returns to the clinic today for follow up visit accompanied by her daughter. The patient is feeling fine today except for the aching pain on the lower back as well as the peripheral neuropathy in the lower extremities. She still on Neurontin 600 mg by mouth 4 times a day. She also complains of some leg pain as well as some intermittent stomach pains and nausea. She's had some intermittent diarrhea but none recently. She requests a refill for her MS Contin and Vicodin. She is tolerating her treatment with Carfilzomib, and Decadron fairly well. She denied having any significant weight loss or night sweats. The patient denied having any vomiting. She has no chest pain, shortness of breath, cough or hemoptysis. She is here today for day 1 of the third cycle.  MEDICAL HISTORY: Past Medical History  Diagnosis Date  . Thyroid disease Hypothyroidism  . Hypercholesterolemia   . Compression fracture 04/08/2007    pathologic compression fracture  . Hypothyroidism   . FHx: chemotherapy     s/p 5 cycle revlimid/low dose decadron,s/p velcade,doxil,decadron,  . Hx of radiation therapy 05/05/07-05/18/07,& 03/05/11-03/21/11-    l3&l5 in 2008, t2-t6 03/2011  . GERD (gastroesophageal reflux disease)   . Insomnia     associated with steroids  . Nausea   . Constipation     takes oxycontin,vicodin  . Hx of radiation therapy 05/05/2007 to 05/18/2007    palliative, L3-5  . 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 Hx  of radiation therapy 03/05/2011 to 03/21/2011    palliative T2-T6, c-spine  . 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 PONV (postoperative nausea and vomiting)   . Metastasis to bone     ALLERGIES:  has No Known Allergies.  MEDICATIONS:  Current Outpatient Prescriptions  Medication Sig Dispense Refill  . acyclovir (ZOVIRAX) 400 MG tablet Take 1 tablet (400 mg total) by mouth daily.  30 tablet  3  .  acyclovir (ZOVIRAX) 800 MG tablet Take 800 mg by mouth.      . ALPRAZolam (XANAX) 0.5 MG tablet Take 0.5 mg by mouth 3 (three) times daily as needed for anxiety.       Marland Kitchen atorvastatin (LIPITOR) 40 MG tablet Take 1 tablet (40 mg total) by mouth daily.  30 tablet  3  . clopidogrel (PLAVIX) 75 MG tablet Take 1 tablet (75 mg total) by mouth daily with breakfast.  30 tablet  1  . cyclobenzaprine (FLEXERIL) 10 MG tablet 10 mg.      . dexamethasone (DECADRON) 4 MG tablet Take 40 mg by mouth once a week. Taken on Thursdays.      Marland Kitchen dexamethasone (DECADRON) 4 MG tablet TAKE 10 TABLETS BY MOUTH ON A WEEKLY BASIS STARTING WITH THE FIRST CYCLE OF CHEMOTHERAPY.  80 tablet  2  . dicyclomine (BENTYL) 20 MG tablet Take 20 mg by mouth 2 (two) times daily.       Marland Kitchen ESZOPICLONE 3 MG tablet Take 3 mg by mouth at bedtime.       . furosemide (LASIX) 40 MG tablet Take 40 mg by mouth 2 (two) times daily.      Marland Kitchen gabapentin (NEURONTIN) 300 MG capsule Take 600 mg by mouth 4 (four) times daily.       Marland Kitchen guaiFENesin (MUCINEX) 600 MG 12 hr tablet Take 1 tablet (600 mg total) by mouth 2 (two) times daily as needed for cough.  20 tablet  0  . hyaluronate sodium (RADIAPLEXRX) GEL Apply 1 application topically 2 (two) times daily. Apply to affected skin area after rad txs and bedtime      . HYDROcodone-acetaminophen (NORCO) 7.5-325 MG per tablet Take 1 tablet by mouth every 6 (six) hours as needed.  30 tablet  0  . levothyroxine (SYNTHROID, LEVOTHROID) 100 MCG tablet Take 100 mcg by mouth daily.       Marland Kitchen lidocaine-prilocaine (EMLA) cream Apply 1 application topically as needed (for port-a-cath access).      Marland Kitchen loperamide (IMODIUM) 2 MG capsule Take 2 mg by mouth 4 (four) times daily as needed for diarrhea or loose stools.      . metFORMIN (GLUCOPHAGE) 500 MG tablet Take 500 mg by mouth daily with breakfast.      . morphine (MS CONTIN) 15 MG 12 hr tablet Take 1 tablet (15 mg total) by mouth 2 (two) times daily.  60 tablet  0  . Multiple  Vitamin (MULITIVITAMIN WITH MINERALS) TABS Take 1 tablet by mouth daily.      . ondansetron (ZOFRAN-ODT) 4 MG disintegrating tablet Take 1 tablet by mouth every 8 (eight) hours as needed for nausea.       . pantoprazole (PROTONIX) 40 MG tablet Take 40 mg by mouth daily.      . potassium chloride (K-DUR,KLOR-CON) 10 MEQ tablet Take 1 tablet (10 mEq total) by mouth 2 (two) times daily.  60 tablet  3  . PRESCRIPTION MEDICATION Inject into the vein every 7 (  seven) days. Velcade infusion every Thursday.       No current facility-administered medications for this visit.   Facility-Administered Medications Ordered in Other Visits  Medication Dose Route Frequency Provider Last Rate Last Dose  . 0.9 %  sodium chloride infusion   Intravenous Once Si Gaul, MD      . ondansetron (ZOFRAN) IVPB 8 mg  8 mg Intravenous Once Si Gaul, MD      . sodium chloride 0.9 % injection 10 mL  10 mL Intracatheter PRN Si Gaul, MD      . sodium chloride 0.9 % injection 10 mL  10 mL Intracatheter PRN Conni Slipper, [name]   10 mL at 06/15/13 1303    SURGICAL HISTORY:  Past Surgical History  Procedure Laterality Date  . Cholecystectomy    . Knee surgery    . Knee surgery    . Video bronchoscopy  07/30/2011    Procedure: VIDEO BRONCHOSCOPY WITHOUT FLUORO;  Surgeon: Barbaraann Share, MD;  Location: [facility];  Service: Cardiopulmonary;  Laterality: Bilateral;    REVIEW OF SYSTEMS:  Constitutional: negative Eyes: negative Ears, nose, mouth, throat, and face: negative Respiratory: negative Cardiovascular: negative Gastrointestinal: negative Genitourinary:negative Integument/breast: negative Hematologic/lymphatic: negative Musculoskeletal:positive for back pain Neurological: positive for seizures Behavioral/Psych: negative Endocrine: negative Allergic/Immunologic: negative   PHYSICAL EXAMINATION: General appearance: alert, cooperative, fatigued and no distress Head: Normocephalic,  without obvious abnormality, atraumatic Neck: no adenopathy, no JVD, supple, symmetrical, trachea midline and thyroid not enlarged, symmetric, no tenderness/mass/nodules Lymph nodes: Cervical, supraclavicular, and axillary nodes normal. Resp: clear to auscultation bilaterally Back: symmetric, no curvature. ROM normal. No CVA tenderness. Cardio: regular rate and rhythm, S1, S2 normal, no murmur, click, rub or gallop GI: soft, non-tender; bowel sounds normal; no masses,  no organomegaly Extremities: extremities normal, atraumatic, no cyanosis or edema Neurologic: Alert and oriented X 3, normal strength and tone. Normal symmetric reflexes. Normal coordination and gait  ECOG PERFORMANCE STATUS: 1 - Symptomatic but completely ambulatory  Blood pressure 122/63, pulse 109, temperature 98.9 F (37.2 C), temperature source Oral, resp. rate 18, height 5\' 6"  (1.676 m), weight 198 lb 1.6 oz (89.858 kg).  LABORATORY DATA: Lab Results  Component Value Date   WBC 9.0 06/15/2013   HGB 11.0* 06/15/2013   HCT 33.9* 06/15/2013   MCV 100.6 06/15/2013   PLT 268 06/15/2013      Chemistry      Component Value Date/Time   NA 142 06/15/2013 0932   NA 138 05/04/2013 0530   K 3.7 06/15/2013 0932   K 3.9 05/04/2013 0530   CL 105 05/04/2013 0530   CL 105 12/17/2012 1015   CO2 23 06/15/2013 0932   CO2 25 05/04/2013 0530   BUN 10.3 06/15/2013 0932   BUN 9 05/04/2013 0530   CREATININE 1.1 06/15/2013 0932   CREATININE 0.68 05/04/2013 0530      Component Value Date/Time   CALCIUM 9.5 06/15/2013 0932   CALCIUM 8.7 05/04/2013 0530   ALKPHOS 117 06/15/2013 0932   ALKPHOS 78 02/24/2013 1500   AST 14 06/15/2013 0932   AST 22 02/24/2013 1500   ALT 20 06/15/2013 0932   ALT 26 02/24/2013 1500   BILITOT 0.78 06/15/2013 0932   BILITOT 0.4 02/24/2013 1500       RADIOGRAPHIC STUDIES:  ASSESSMENT AND PLAN:  This is a very pleasant 62 years old [place] American female with recurrent multiple myeloma recently completed  a course of treatment with Velcade and Decadron with improvement in  her disease but this was discontinued secondary to peripheral neuropathy. The patient is tolerating her treatment with Carfilzomib and Decadron fairly well. She is status post 2 cycles. Patient was discussed with also seen by Dr. Arbutus Ped. She'll proceed with cycle #3 of her chemotherapy as scheduled today. She is given refill prescription for her MS Contin and Vicodin tablets. She'll followup in 3 weeks for another symptom management visit with a repeat CBC differential and C. met. Her protein studies are relatively stable. We'll have her complete another 2-3 cycles before rechecking her protein studies again to reevaluate her disease.  She was advised to call immediately if she has any concerning symptoms in the interval. The patient voices understanding of current disease status and treatment options and is in agreement with the current care plan.  All questions were answered. The patient knows to call the clinic with any problems, questions or concerns. We can certainly see the patient much sooner if necessary.  Conni Slipper [name]  ADDENDUM: Hematology/Oncology Attending: I had a face to face encounter with the patient. I recommended her care plan. She is a very pleasant 62 years old [place] American female with history of recurrent multiple myeloma who is currently undergoing treatment with Carfilzomib, and Decadron. She is tolerating her treatment fairly well with no significant adverse effects. The myeloma panel showed no significant evidence for disease progression. I discussed the lab result with the patient and her daughter. I recommended for her to continue with the same treatment of Carfilzomib and Decadron.  For pain management the patient was given a refill of her treatment with MS Contin and Vicodin. She would come back for followup visit in 3 weeks for reevaluation. She was advised to call immediately if she has any  concerning symptoms in the interval. Lajuana Matte., MD. 06/16/2013

## 2013-06-15 NOTE — Patient Instructions (Signed)
[facility] Discharge Instructions for Patients Receiving Chemotherapy  Today you received the following chemotherapy agents Kyprolis To help prevent nausea and vomiting after your treatment, we encourage you to take your nausea medication as prescribed.  If you develop nausea and vomiting that is not controlled by your nausea medication, call the clinic.   BELOW ARE SYMPTOMS THAT SHOULD BE REPORTED IMMEDIATELY:  *FEVER GREATER THAN 100.5 F  *CHILLS WITH OR WITHOUT FEVER  NAUSEA AND VOMITING THAT IS NOT CONTROLLED WITH YOUR NAUSEA MEDICATION  *UNUSUAL SHORTNESS OF BREATH  *UNUSUAL BRUISING OR BLEEDING  TENDERNESS IN MOUTH AND THROAT WITH OR WITHOUT PRESENCE OF ULCERS  *URINARY PROBLEMS  *BOWEL PROBLEMS  UNUSUAL RASH Items with * indicate a potential emergency and should be followed up as soon as possible.  Feel free to call the clinic you have any questions or concerns. The clinic phone number is [phone].    

## 2013-06-16 ENCOUNTER — Ambulatory Visit (HOSPITAL_BASED_OUTPATIENT_CLINIC_OR_DEPARTMENT_OTHER): Payer: Medicare Other

## 2013-06-16 ENCOUNTER — Other Ambulatory Visit: Payer: Self-pay | Admitting: *Deleted

## 2013-06-16 VITALS — BP 127/70 | HR 87 | Temp 98.6°F | Resp 20

## 2013-06-16 DIAGNOSIS — Z5112 Encounter for antineoplastic immunotherapy: Secondary | ICD-10-CM

## 2013-06-16 DIAGNOSIS — C9000 Multiple myeloma not having achieved remission: Secondary | ICD-10-CM

## 2013-06-16 DIAGNOSIS — C9002 Multiple myeloma in relapse: Secondary | ICD-10-CM

## 2013-06-16 MED ORDER — HYDROCODONE-ACETAMINOPHEN 7.5-325 MG PO TABS: 1.0000 | ORAL_TABLET | Freq: Four times a day (QID) | ORAL | Status: DC | PRN

## 2013-06-16 MED ORDER — SODIUM CHLORIDE 0.9 % IV SOLN
Freq: Once | INTRAVENOUS | Status: AC
  Administered 2013-06-16: 12:00:00 via INTRAVENOUS

## 2013-06-16 MED ORDER — HEPARIN SOD (PORK) LOCK FLUSH 100 UNIT/ML IV SOLN
500.0000 [IU] | Freq: Once | INTRAVENOUS | Status: AC | PRN
  Administered 2013-06-16: 500 [IU]
  Filled 2013-06-16: qty 5

## 2013-06-16 MED ORDER — ONDANSETRON 8 MG/50ML IVPB (CHCC)
8.0000 mg | Freq: Once | INTRAVENOUS | Status: AC
  Administered 2013-06-16: 8 mg via INTRAVENOUS

## 2013-06-16 MED ORDER — DEXAMETHASONE SODIUM PHOSPHATE 10 MG/ML IJ SOLN
10.0000 mg | Freq: Once | INTRAMUSCULAR | Status: AC
  Administered 2013-06-16: 10 mg via INTRAVENOUS

## 2013-06-16 MED ORDER — SODIUM CHLORIDE 0.9 % IJ SOLN
10.0000 mL | INTRAMUSCULAR | Status: DC | PRN
  Administered 2013-06-16: 10 mL
  Filled 2013-06-16: qty 10

## 2013-06-16 MED ORDER — DEXAMETHASONE SODIUM PHOSPHATE 10 MG/ML IJ SOLN
INTRAMUSCULAR | Status: AC
  Filled 2013-06-16: qty 1

## 2013-06-16 MED ORDER — ONDANSETRON 8 MG/NS 50 ML IVPB
INTRAVENOUS | Status: AC
  Filled 2013-06-16: qty 8

## 2013-06-16 MED ORDER — DEXTROSE 5 % IV SOLN
27.0000 mg/m2 | Freq: Once | INTRAVENOUS | Status: AC
  Administered 2013-06-16: 56 mg via INTRAVENOUS
  Filled 2013-06-16: qty 28

## 2013-06-16 MED ORDER — SODIUM CHLORIDE 0.9 % IV SOLN
Freq: Once | INTRAVENOUS | Status: AC
  Administered 2013-06-16: 11:00:00 via INTRAVENOUS

## 2013-06-16 MED ORDER — MORPHINE SULFATE ER 15 MG PO TBCR: 15.0000 mg | EXTENDED_RELEASE_TABLET | Freq: Two times a day (BID) | ORAL | Status: DC

## 2013-06-16 NOTE — Patient Instructions (Signed)
[facility] Discharge Instructions for Patients Receiving Chemotherapy  Today you received the following chemotherapy agents :  Kyprolis.  To help prevent nausea and vomiting after your treatment, we encourage you to take your nausea medication as instructed by your physician.   If you develop nausea and vomiting that is not controlled by your nausea medication, call the clinic.   BELOW ARE SYMPTOMS THAT SHOULD BE REPORTED IMMEDIATELY:  *FEVER GREATER THAN 100.5 F  *CHILLS WITH OR WITHOUT FEVER  NAUSEA AND VOMITING THAT IS NOT CONTROLLED WITH YOUR NAUSEA MEDICATION  *UNUSUAL SHORTNESS OF BREATH  *UNUSUAL BRUISING OR BLEEDING  TENDERNESS IN MOUTH AND THROAT WITH OR WITHOUT PRESENCE OF ULCERS  *URINARY PROBLEMS  *BOWEL PROBLEMS  UNUSUAL RASH Items with * indicate a potential emergency and should be followed up as soon as possible.  Feel free to call the clinic you have any questions or concerns. The clinic phone number is [phone].    

## 2013-06-16 NOTE — Patient Instructions (Signed)
Continue with your labs and chemotherapy as scheduled Followup in 3 weeks for another symptom management visit 

## 2013-06-22 ENCOUNTER — Other Ambulatory Visit: Payer: Self-pay | Admitting: Internal Medicine

## 2013-06-22 ENCOUNTER — Other Ambulatory Visit (HOSPITAL_BASED_OUTPATIENT_CLINIC_OR_DEPARTMENT_OTHER): Payer: Medicare Other

## 2013-06-22 ENCOUNTER — Ambulatory Visit (HOSPITAL_BASED_OUTPATIENT_CLINIC_OR_DEPARTMENT_OTHER): Payer: Medicare Other

## 2013-06-22 VITALS — BP 120/62 | HR 90 | Temp 97.6°F | Resp 18

## 2013-06-22 DIAGNOSIS — C9002 Multiple myeloma in relapse: Secondary | ICD-10-CM

## 2013-06-22 DIAGNOSIS — C9000 Multiple myeloma not having achieved remission: Secondary | ICD-10-CM

## 2013-06-22 DIAGNOSIS — Z5112 Encounter for antineoplastic immunotherapy: Secondary | ICD-10-CM

## 2013-06-22 LAB — COMPREHENSIVE METABOLIC PANEL (CC13)
ALT: 21 U/L (ref 0–55)
AST: 12 U/L (ref 5–34)
Albumin: 3.3 g/dL — ABNORMAL LOW (ref 3.5–5.0)
Alkaline Phosphatase: 99 U/L (ref 40–150)
Anion Gap: 13 mEq/L — ABNORMAL HIGH (ref 3–11)
CO2: 23 mEq/L (ref 22–29)
Creatinine: 1.1 mg/dL (ref 0.6–1.1)
Glucose: 137 mg/dl (ref 70–140)
Potassium: 3.8 mEq/L (ref 3.5–5.1)
Sodium: 141 mEq/L (ref 136–145)
Total Bilirubin: 0.89 mg/dL (ref 0.20–1.20)
Total Protein: 7.3 g/dL (ref 6.4–8.3)

## 2013-06-22 LAB — CBC WITH DIFFERENTIAL/PLATELET
BASO%: 0.1 % (ref 0.0–2.0)
EOS%: 0.3 % (ref 0.0–7.0)
Eosinophils Absolute: 0 10*3/uL (ref 0.0–0.5)
LYMPH%: 9.1 % — ABNORMAL LOW (ref 14.0–49.7)
MCHC: 32.4 g/dL (ref 31.5–36.0)
MCV: 100.6 fL (ref 79.5–101.0)
MONO%: 3.7 % (ref 0.0–14.0)
NEUT#: 7.6 10*3/uL — ABNORMAL HIGH (ref 1.5–6.5)
Platelets: 252 10*3/uL (ref 145–400)
RBC: 3.22 10*6/uL — ABNORMAL LOW (ref 3.70–5.45)
RDW: 16.1 % — ABNORMAL HIGH (ref 11.2–14.5)

## 2013-06-22 MED ORDER — ONDANSETRON 8 MG/50ML IVPB (CHCC)
8.0000 mg | Freq: Once | INTRAVENOUS | Status: AC
  Administered 2013-06-22: 8 mg via INTRAVENOUS

## 2013-06-22 MED ORDER — DEXTROSE 5 % IV SOLN
27.0000 mg/m2 | Freq: Once | INTRAVENOUS | Status: AC
  Administered 2013-06-22: 56 mg via INTRAVENOUS
  Filled 2013-06-22: qty 28

## 2013-06-22 MED ORDER — DEXAMETHASONE SODIUM PHOSPHATE 10 MG/ML IJ SOLN
10.0000 mg | Freq: Once | INTRAMUSCULAR | Status: AC
  Administered 2013-06-22: 10 mg via INTRAVENOUS

## 2013-06-22 MED ORDER — SODIUM CHLORIDE 0.9 % IJ SOLN
10.0000 mL | INTRAMUSCULAR | Status: DC | PRN
  Administered 2013-06-22: 10 mL
  Filled 2013-06-22: qty 10

## 2013-06-22 MED ORDER — SODIUM CHLORIDE 0.9 % IV SOLN: Freq: Once | INTRAVENOUS | Status: DC

## 2013-06-22 MED ORDER — SODIUM CHLORIDE 0.9 % IV SOLN
Freq: Once | INTRAVENOUS | Status: AC
  Administered 2013-06-22: 11:00:00 via INTRAVENOUS

## 2013-06-22 MED ORDER — DEXAMETHASONE SODIUM PHOSPHATE 10 MG/ML IJ SOLN
INTRAMUSCULAR | Status: AC
  Filled 2013-06-22: qty 1

## 2013-06-22 MED ORDER — ONDANSETRON 8 MG/NS 50 ML IVPB
INTRAVENOUS | Status: AC
  Filled 2013-06-22: qty 8

## 2013-06-22 MED ORDER — HEPARIN SOD (PORK) LOCK FLUSH 100 UNIT/ML IV SOLN
500.0000 [IU] | Freq: Once | INTRAVENOUS | Status: AC | PRN
  Administered 2013-06-22: 500 [IU]
  Filled 2013-06-22: qty 5

## 2013-06-22 NOTE — Patient Instructions (Signed)
[facility] Discharge Instructions for Patients Receiving Chemotherapy  Today you received the following chemotherapy agents Kyprolis  To help prevent nausea and vomiting after your treatment, we encourage you to take your nausea medication as needed   If you develop nausea and vomiting that is not controlled by your nausea medication, call the clinic.   BELOW ARE SYMPTOMS THAT SHOULD BE REPORTED IMMEDIATELY:  *FEVER GREATER THAN 100.5 F  *CHILLS WITH OR WITHOUT FEVER  NAUSEA AND VOMITING THAT IS NOT CONTROLLED WITH YOUR NAUSEA MEDICATION  *UNUSUAL SHORTNESS OF BREATH  *UNUSUAL BRUISING OR BLEEDING  TENDERNESS IN MOUTH AND THROAT WITH OR WITHOUT PRESENCE OF ULCERS  *URINARY PROBLEMS  *BOWEL PROBLEMS  UNUSUAL RASH Items with * indicate a potential emergency and should be followed up as soon as possible.  Feel free to call the clinic you have any questions or concerns. The clinic phone number is [phone].    

## 2013-06-23 ENCOUNTER — Ambulatory Visit (HOSPITAL_BASED_OUTPATIENT_CLINIC_OR_DEPARTMENT_OTHER): Payer: Medicare Other

## 2013-06-23 VITALS — BP 145/74 | HR 104 | Temp 99.2°F | Resp 20

## 2013-06-23 DIAGNOSIS — C9002 Multiple myeloma in relapse: Secondary | ICD-10-CM

## 2013-06-23 DIAGNOSIS — Z5112 Encounter for antineoplastic immunotherapy: Secondary | ICD-10-CM

## 2013-06-23 DIAGNOSIS — C9000 Multiple myeloma not having achieved remission: Secondary | ICD-10-CM

## 2013-06-23 MED ORDER — SODIUM CHLORIDE 0.9 % IV SOLN
Freq: Once | INTRAVENOUS | Status: AC
  Administered 2013-06-23: 10:00:00 via INTRAVENOUS

## 2013-06-23 MED ORDER — DEXAMETHASONE SODIUM PHOSPHATE 10 MG/ML IJ SOLN
10.0000 mg | Freq: Once | INTRAMUSCULAR | Status: AC
  Administered 2013-06-23: 10 mg via INTRAVENOUS

## 2013-06-23 MED ORDER — DEXAMETHASONE SODIUM PHOSPHATE 10 MG/ML IJ SOLN
INTRAMUSCULAR | Status: AC
  Filled 2013-06-23: qty 1

## 2013-06-23 MED ORDER — ONDANSETRON 8 MG/50ML IVPB (CHCC)
8.0000 mg | Freq: Once | INTRAVENOUS | Status: AC
  Administered 2013-06-23: 8 mg via INTRAVENOUS

## 2013-06-23 MED ORDER — ONDANSETRON 8 MG/NS 50 ML IVPB
INTRAVENOUS | Status: AC
  Filled 2013-06-23: qty 8

## 2013-06-23 MED ORDER — DEXTROSE 5 % IV SOLN
27.0000 mg/m2 | Freq: Once | INTRAVENOUS | Status: AC
  Administered 2013-06-23: 56 mg via INTRAVENOUS
  Filled 2013-06-23: qty 28

## 2013-06-23 MED ORDER — SODIUM CHLORIDE 0.9 % IJ SOLN
10.0000 mL | INTRAMUSCULAR | Status: DC | PRN
  Administered 2013-06-23: 10 mL
  Filled 2013-06-23: qty 10

## 2013-06-23 MED ORDER — HEPARIN SOD (PORK) LOCK FLUSH 100 UNIT/ML IV SOLN
500.0000 [IU] | Freq: Once | INTRAVENOUS | Status: AC | PRN
  Administered 2013-06-23: 500 [IU]
  Filled 2013-06-23: qty 5

## 2013-06-23 NOTE — Patient Instructions (Signed)
[facility] Discharge Instructions for Patients Receiving Chemotherapy  Today you received the following chemotherapy agents :  Kyprolis.  To help prevent nausea and vomiting after your treatment, we encourage you to take your nausea medication as instructed by your physician.   If you develop nausea and vomiting that is not controlled by your nausea medication, call the clinic.   BELOW ARE SYMPTOMS THAT SHOULD BE REPORTED IMMEDIATELY:  *FEVER GREATER THAN 100.5 F  *CHILLS WITH OR WITHOUT FEVER  NAUSEA AND VOMITING THAT IS NOT CONTROLLED WITH YOUR NAUSEA MEDICATION  *UNUSUAL SHORTNESS OF BREATH  *UNUSUAL BRUISING OR BLEEDING  TENDERNESS IN MOUTH AND THROAT WITH OR WITHOUT PRESENCE OF ULCERS  *URINARY PROBLEMS  *BOWEL PROBLEMS  UNUSUAL RASH Items with * indicate a potential emergency and should be followed up as soon as possible.  Feel free to call the clinic you have any questions or concerns. The clinic phone number is [phone].    

## 2013-06-29 ENCOUNTER — Ambulatory Visit (HOSPITAL_BASED_OUTPATIENT_CLINIC_OR_DEPARTMENT_OTHER): Payer: Medicare Other

## 2013-06-29 ENCOUNTER — Other Ambulatory Visit (HOSPITAL_BASED_OUTPATIENT_CLINIC_OR_DEPARTMENT_OTHER): Payer: Medicare Other

## 2013-06-29 VITALS — BP 135/80 | HR 115 | Temp 98.6°F | Resp 20

## 2013-06-29 DIAGNOSIS — C9002 Multiple myeloma in relapse: Secondary | ICD-10-CM

## 2013-06-29 DIAGNOSIS — Z5112 Encounter for antineoplastic immunotherapy: Secondary | ICD-10-CM

## 2013-06-29 DIAGNOSIS — C9000 Multiple myeloma not having achieved remission: Secondary | ICD-10-CM

## 2013-06-29 LAB — COMPREHENSIVE METABOLIC PANEL (CC13)
ALT: 19 U/L (ref 0–55)
AST: 11 U/L (ref 5–34)
Albumin: 3.3 g/dL — ABNORMAL LOW (ref 3.5–5.0)
Alkaline Phosphatase: 126 U/L (ref 40–150)
CO2: 24 mEq/L (ref 22–29)
Calcium: 9 mg/dL (ref 8.4–10.4)
Chloride: 102 mEq/L (ref 98–109)
Glucose: 108 mg/dl (ref 70–140)
Potassium: 3.6 mEq/L (ref 3.5–5.1)
Sodium: 139 mEq/L (ref 136–145)
Total Protein: 7 g/dL (ref 6.4–8.3)

## 2013-06-29 LAB — CBC WITH DIFFERENTIAL/PLATELET
BASO%: 0.1 % (ref 0.0–2.0)
Basophils Absolute: 0 10*3/uL (ref 0.0–0.1)
EOS%: 0.3 % (ref 0.0–7.0)
HGB: 10.9 g/dL — ABNORMAL LOW (ref 11.6–15.9)
MCH: 35.1 pg — ABNORMAL HIGH (ref 25.1–34.0)
MCV: 101.4 fL — ABNORMAL HIGH (ref 79.5–101.0)
MONO#: 0.9 10*3/uL (ref 0.1–0.9)
MONO%: 9.8 % (ref 0.0–14.0)
NEUT#: 7.4 10*3/uL — ABNORMAL HIGH (ref 1.5–6.5)
RBC: 3.1 10*6/uL — ABNORMAL LOW (ref 3.70–5.45)
RDW: 17.1 % — ABNORMAL HIGH (ref 11.2–14.5)
WBC: 9.4 10*3/uL (ref 3.9–10.3)
lymph#: 1.1 10*3/uL (ref 0.9–3.3)

## 2013-06-29 MED ORDER — DEXAMETHASONE SODIUM PHOSPHATE 10 MG/ML IJ SOLN
INTRAMUSCULAR | Status: AC
  Filled 2013-06-29: qty 1

## 2013-06-29 MED ORDER — DEXTROSE 5 % IV SOLN
27.0000 mg/m2 | Freq: Once | INTRAVENOUS | Status: AC
  Administered 2013-06-29: 56 mg via INTRAVENOUS
  Filled 2013-06-29: qty 28

## 2013-06-29 MED ORDER — HEPARIN SOD (PORK) LOCK FLUSH 100 UNIT/ML IV SOLN
500.0000 [IU] | Freq: Once | INTRAVENOUS | Status: AC | PRN
  Administered 2013-06-29: 500 [IU]
  Filled 2013-06-29: qty 5

## 2013-06-29 MED ORDER — ONDANSETRON 8 MG/NS 50 ML IVPB
INTRAVENOUS | Status: AC
  Filled 2013-06-29: qty 8

## 2013-06-29 MED ORDER — SODIUM CHLORIDE 0.9 % IV SOLN
Freq: Once | INTRAVENOUS | Status: AC
  Administered 2013-06-29: 11:00:00 via INTRAVENOUS

## 2013-06-29 MED ORDER — DEXAMETHASONE SODIUM PHOSPHATE 10 MG/ML IJ SOLN
10.0000 mg | Freq: Once | INTRAMUSCULAR | Status: AC
  Administered 2013-06-29: 10 mg via INTRAVENOUS

## 2013-06-29 MED ORDER — ONDANSETRON 8 MG/50ML IVPB (CHCC)
8.0000 mg | Freq: Once | INTRAVENOUS | Status: AC
  Administered 2013-06-29: 8 mg via INTRAVENOUS

## 2013-06-29 MED ORDER — SODIUM CHLORIDE 0.9 % IV SOLN: Freq: Once | INTRAVENOUS | Status: DC

## 2013-06-29 MED ORDER — SODIUM CHLORIDE 0.9 % IJ SOLN
10.0000 mL | INTRAMUSCULAR | Status: DC | PRN
  Administered 2013-06-29: 10 mL
  Filled 2013-06-29: qty 10

## 2013-06-29 NOTE — Patient Instructions (Signed)
[facility] Discharge Instructions for Patients Receiving Chemotherapy  Today you received the following chemotherapy agents Kyprolis.  To help prevent nausea and vomiting after your treatment, we encourage you to take your nausea medication if needed.  If you develop nausea and vomiting that is not controlled by your nausea medication, call the clinic.   BELOW ARE SYMPTOMS THAT SHOULD BE REPORTED IMMEDIATELY:  *FEVER GREATER THAN 100.5 F  *CHILLS WITH OR WITHOUT FEVER  NAUSEA AND VOMITING THAT IS NOT CONTROLLED WITH YOUR NAUSEA MEDICATION  *UNUSUAL SHORTNESS OF BREATH  *UNUSUAL BRUISING OR BLEEDING  TENDERNESS IN MOUTH AND THROAT WITH OR WITHOUT PRESENCE OF ULCERS  *URINARY PROBLEMS  *BOWEL PROBLEMS  UNUSUAL RASH Items with * indicate a potential emergency and should be followed up as soon as possible.  Feel free to call the clinic you have any questions or concerns. The clinic phone number is [phone].

## 2013-06-30 ENCOUNTER — Ambulatory Visit (HOSPITAL_BASED_OUTPATIENT_CLINIC_OR_DEPARTMENT_OTHER): Payer: Medicare Other

## 2013-06-30 VITALS — BP 116/68 | HR 102 | Temp 98.3°F | Resp 20

## 2013-06-30 DIAGNOSIS — Z5112 Encounter for antineoplastic immunotherapy: Secondary | ICD-10-CM

## 2013-06-30 DIAGNOSIS — C9002 Multiple myeloma in relapse: Secondary | ICD-10-CM

## 2013-06-30 DIAGNOSIS — C9000 Multiple myeloma not having achieved remission: Secondary | ICD-10-CM

## 2013-06-30 MED ORDER — HEPARIN SOD (PORK) LOCK FLUSH 100 UNIT/ML IV SOLN
500.0000 [IU] | Freq: Once | INTRAVENOUS | Status: AC | PRN
  Administered 2013-06-30: 500 [IU]
  Filled 2013-06-30: qty 5

## 2013-06-30 MED ORDER — DEXTROSE 5 % IV SOLN
27.0000 mg/m2 | Freq: Once | INTRAVENOUS | Status: AC
  Administered 2013-06-30: 56 mg via INTRAVENOUS
  Filled 2013-06-30: qty 28

## 2013-06-30 MED ORDER — ONDANSETRON 8 MG/NS 50 ML IVPB
INTRAVENOUS | Status: AC
  Filled 2013-06-30: qty 8

## 2013-06-30 MED ORDER — DEXAMETHASONE SODIUM PHOSPHATE 10 MG/ML IJ SOLN
INTRAMUSCULAR | Status: AC
  Filled 2013-06-30: qty 1

## 2013-06-30 MED ORDER — SODIUM CHLORIDE 0.9 % IJ SOLN
10.0000 mL | INTRAMUSCULAR | Status: DC | PRN
  Administered 2013-06-30: 10 mL
  Filled 2013-06-30: qty 10

## 2013-06-30 MED ORDER — DEXAMETHASONE SODIUM PHOSPHATE 10 MG/ML IJ SOLN
10.0000 mg | Freq: Once | INTRAMUSCULAR | Status: AC
  Administered 2013-06-30: 10 mg via INTRAVENOUS

## 2013-06-30 MED ORDER — SODIUM CHLORIDE 0.9 % IV SOLN
Freq: Once | INTRAVENOUS | Status: AC
  Administered 2013-06-30: 11:00:00 via INTRAVENOUS

## 2013-06-30 MED ORDER — ONDANSETRON 8 MG/50ML IVPB (CHCC)
8.0000 mg | Freq: Once | INTRAVENOUS | Status: AC
  Administered 2013-06-30: 8 mg via INTRAVENOUS

## 2013-06-30 NOTE — Patient Instructions (Signed)
[facility] Discharge Instructions for Patients Receiving Chemotherapy  Today you received the following chemotherapy agents Kyprolis To help prevent nausea and vomiting after your treatment, we encourage you to take your nausea medication as prescribed.  If you develop nausea and vomiting that is not controlled by your nausea medication, call the clinic.   BELOW ARE SYMPTOMS THAT SHOULD BE REPORTED IMMEDIATELY:  *FEVER GREATER THAN 100.5 F  *CHILLS WITH OR WITHOUT FEVER  NAUSEA AND VOMITING THAT IS NOT CONTROLLED WITH YOUR NAUSEA MEDICATION  *UNUSUAL SHORTNESS OF BREATH  *UNUSUAL BRUISING OR BLEEDING  TENDERNESS IN MOUTH AND THROAT WITH OR WITHOUT PRESENCE OF ULCERS  *URINARY PROBLEMS  *BOWEL PROBLEMS  UNUSUAL RASH Items with * indicate a potential emergency and should be followed up as soon as possible.  Feel free to call the clinic you have any questions or concerns. The clinic phone number is [phone].    

## 2013-07-02 ENCOUNTER — Encounter: Payer: Self-pay | Admitting: Internal Medicine

## 2013-07-02 ENCOUNTER — Ambulatory Visit (INDEPENDENT_AMBULATORY_CARE_PROVIDER_SITE_OTHER): Payer: Medicare Other | Admitting: Physician Assistant

## 2013-07-02 VITALS — BP 112/60 | HR 88 | Temp 98.1°F | Resp 18 | Wt 206.4 lb

## 2013-07-02 DIAGNOSIS — R059 Cough, unspecified: Secondary | ICD-10-CM

## 2013-07-02 DIAGNOSIS — R490 Dysphonia: Secondary | ICD-10-CM

## 2013-07-02 DIAGNOSIS — R05 Cough: Secondary | ICD-10-CM

## 2013-07-02 MED ORDER — PROMETHAZINE-DM 6.25-15 MG/5ML PO SYRP: 5.0000 mL | ORAL_SOLUTION | Freq: Four times a day (QID) | ORAL | Status: DC | PRN

## 2013-07-02 MED ORDER — AZITHROMYCIN 250 MG PO TABS: ORAL_TABLET | ORAL | Status: AC

## 2013-07-02 MED ORDER — TRIAMCINOLONE ACETONIDE 0.1 % EX CREA: 1.0000 "application " | TOPICAL_CREAM | Freq: Two times a day (BID) | CUTANEOUS | Status: DC

## 2013-07-02 NOTE — Patient Instructions (Signed)
Hoarseness Hoarseness is produced from a variety of causes. It is important to find the cause so it can be treated. In the absence of a cold or upper respiratory illness, any hoarseness lasting more than 2 weeks should be looked at by a specialist. This is especially important if you have a history of smoking or alcohol use. It is also important to keep in mind that as you grow older, your voice will naturally get weaker, making it easier for you to become hoarse from straining your vocal cords.  CAUSES  Any illness that affects your vocal cords can result in a hoarse voice. Examples of conditions that can affect the vocal cords are listed as follows:   Allergies.  Colds.  Sinusitis.  Gastroesophageal reflux disease.  Croup.  Injury.  Nodules.  Exposure to smoke or toxic fumes or gases.  Congenital and genetic defects.  Paralysis of the vocal cords.  Infections.  Advanced age. DIAGNOSIS  In order to diagnose the cause of your hoarseness, your caregiver will examine your throat using an instrument that uses a tube with a small lighted camera (laryngoscope). It allows your caregiver to look into the mouth and down the throat. TREATMENT  For most cases, treatment will focus on the specific cause of the hoarseness. Depending on the cause, hoarseness can be a temporary condition (acute) or it can be long lasting (chronic). Most cases of hoarseness clear up without complications. Your caregiver will explain to you if this is not likely to happen. SEEK IMMEDIATE MEDICAL CARE IF:   You have increasing hoarseness or loss of voice.  You have shortness of breath.  You are coughing up blood.  There is pain in your neck or throat. Document Released: 05/31/2005 Document Revised: 09/09/2011 Document Reviewed: 08/23/2010 [employer] Patient Information 2014 [employer].  

## 2013-07-02 NOTE — Progress Notes (Signed)
   Subjective:    Patient ID: [name], female    DOB: 08-22-1950, 63 y.o.   MRN: [id number]  HPI Patient has been having intermittent coughing, hoarsness since Apr 01 1013. She states she did get better then it started back in the past week. She had a stroke in August and is currently getting chemo for MM last treatment was Wednesday. Having myalgias, fatigue secondary to chemo. Husband accompanies her and states she has been wheezing and coughing at night. States occasionally with liquids it feels like it gets stuck, very rarely with larger pills. Occ bad taste in the back of her mouth and epigastric pain. Denies fever, chills, headache, nausea, SOB, CP.   Review of Systems  Constitutional: Positive for fatigue. Negative for fever, chills, diaphoresis, activity change, appetite change and unexpected weight change.  HENT: Positive for trouble swallowing and voice change. Negative for congestion, ear discharge, ear pain, nosebleeds, rhinorrhea, sinus pressure, sneezing and sore throat.   Eyes: Negative.   Respiratory: Positive for cough. Negative for chest tightness, shortness of breath and wheezing.   Cardiovascular: Negative.   Gastrointestinal: Positive for abdominal pain (epigastric). Negative for nausea, vomiting, diarrhea, constipation, blood in stool, abdominal distention, anal bleeding and rectal pain.  Endocrine: Negative.   Genitourinary: Negative.   Musculoskeletal: Positive for myalgias (bone pain).  Skin: Negative.   Neurological: Negative.   Hematological: Negative.       Objective:   Physical Exam  Vitals reviewed. Constitutional: She is oriented to person, place, and time. She appears well-developed and well-nourished.  HENT:  Head: Normocephalic and atraumatic.  Right Ear: External ear normal.  Left Ear: External ear normal.  Mouth/Throat: Oropharynx is clear and moist.  Eyes: Conjunctivae are normal. Pupils are equal, round, and reactive to light.  Neck:  Normal range of motion. Neck supple.  Cardiovascular: Normal rate, regular rhythm, normal heart sounds and intact distal pulses.   Pulmonary/Chest: Effort normal and breath sounds normal. She has no wheezes.  Abdominal: Soft. Bowel sounds are normal. There is no tenderness.  Musculoskeletal: Normal range of motion.  Lymphadenopathy:    She has no cervical adenopathy.  Neurological: She is alert and oriented to person, place, and time.  Skin: Skin is warm and dry.      Assessment & Plan:  Hoarsness/dysphagia- possible GERD, versus sinus infection, versus from stroke and possible vocal cord paralysis or esophageal dismotility- no choking on foods.  Had normal EGD 2012, normal CXR 05/2013 Zpak Dexilant Refer to ENT to rule out vocal cord paralysis Then refer to GI if she does not get better

## 2013-07-06 ENCOUNTER — Ambulatory Visit: Payer: Medicare Other

## 2013-07-06 ENCOUNTER — Telehealth: Payer: Self-pay | Admitting: *Deleted

## 2013-07-06 ENCOUNTER — Telehealth: Payer: Self-pay | Admitting: Internal Medicine

## 2013-07-06 ENCOUNTER — Other Ambulatory Visit (HOSPITAL_BASED_OUTPATIENT_CLINIC_OR_DEPARTMENT_OTHER): Payer: Medicare Other

## 2013-07-06 ENCOUNTER — Other Ambulatory Visit: Payer: Medicare Other

## 2013-07-06 ENCOUNTER — Encounter: Payer: Self-pay | Admitting: Internal Medicine

## 2013-07-06 ENCOUNTER — Ambulatory Visit (HOSPITAL_BASED_OUTPATIENT_CLINIC_OR_DEPARTMENT_OTHER): Payer: Medicare Other | Admitting: Internal Medicine

## 2013-07-06 VITALS — BP 134/79 | HR 95 | Temp 98.6°F | Resp 18 | Ht 66.0 in | Wt 203.4 lb

## 2013-07-06 DIAGNOSIS — C9000 Multiple myeloma not having achieved remission: Secondary | ICD-10-CM

## 2013-07-06 DIAGNOSIS — G609 Hereditary and idiopathic neuropathy, unspecified: Secondary | ICD-10-CM

## 2013-07-06 LAB — CBC WITH DIFFERENTIAL/PLATELET
BASO%: 0.1 % (ref 0.0–2.0)
Basophils Absolute: 0 10*3/uL (ref 0.0–0.1)
EOS%: 0.9 % (ref 0.0–7.0)
Eosinophils Absolute: 0.1 10*3/uL (ref 0.0–0.5)
HEMATOCRIT: 31.6 % — AB (ref 34.8–46.6)
HEMOGLOBIN: 10.2 g/dL — AB (ref 11.6–15.9)
LYMPH%: 10.8 % — ABNORMAL LOW (ref 14.0–49.7)
MCH: 32.3 pg (ref 25.1–34.0)
MCHC: 32.3 g/dL (ref 31.5–36.0)
MCV: 100 fL (ref 79.5–101.0)
MONO#: 0.5 10*3/uL (ref 0.1–0.9)
MONO%: 6.7 % (ref 0.0–14.0)
NEUT#: 5.7 10*3/uL (ref 1.5–6.5)
NEUT%: 81.5 % — AB (ref 38.4–76.8)
Platelets: 190 10*3/uL (ref 145–400)
RBC: 3.16 10*6/uL — ABNORMAL LOW (ref 3.70–5.45)
RDW: 16.3 % — AB (ref 11.2–14.5)
WBC: 7 10*3/uL (ref 3.9–10.3)
lymph#: 0.8 10*3/uL — ABNORMAL LOW (ref 0.9–3.3)

## 2013-07-06 LAB — COMPREHENSIVE METABOLIC PANEL (CC13)
ALT: 30 U/L (ref 0–55)
ANION GAP: 9 meq/L (ref 3–11)
AST: 23 U/L (ref 5–34)
Albumin: 3.2 g/dL — ABNORMAL LOW (ref 3.5–5.0)
Alkaline Phosphatase: 103 U/L (ref 40–150)
BILIRUBIN TOTAL: 1.02 mg/dL (ref 0.20–1.20)
BUN: 8.8 mg/dL (ref 7.0–26.0)
CO2: 27 meq/L (ref 22–29)
CREATININE: 0.8 mg/dL (ref 0.6–1.1)
Calcium: 9.1 mg/dL (ref 8.4–10.4)
Chloride: 103 mEq/L (ref 98–109)
GLUCOSE: 132 mg/dL (ref 70–140)
Potassium: 4.1 mEq/L (ref 3.5–5.1)
Sodium: 139 mEq/L (ref 136–145)
Total Protein: 7 g/dL (ref 6.4–8.3)

## 2013-07-06 MED ORDER — ONDANSETRON 8 MG/NS 50 ML IVPB
INTRAVENOUS | Status: AC
  Filled 2013-07-06: qty 8

## 2013-07-06 MED ORDER — DEXAMETHASONE SODIUM PHOSPHATE 10 MG/ML IJ SOLN
INTRAMUSCULAR | Status: AC
  Filled 2013-07-06: qty 1

## 2013-07-06 NOTE — Telephone Encounter (Signed)
recd email from [place] she had completed pts chemo scheduling.  Added needed appts per 1/6 POF CAL and AVS given to pt shh

## 2013-07-06 NOTE — Progress Notes (Signed)
[place] [phone]   [phone]  OFFICE PROGRESS NOTE  [name], [name], R, [name] [address] [address]  DIAGNOSIS: Recurrent multiple myeloma initially diagnosed in October 2008.   PRIOR THERAPY:  1. Status post palliative radiotherapy to the lumbar spine between L3 and L5. The patient received a total dose of 3000 cGy in 10 fractions under the care of Dr. Lisbeth Renshaw between May 05, 2007 through May 18, 2007. 2. Status post 5 cycles of systemic chemotherapy with Revlimid and low-dose Decadron with good response to this treatment. 3. Status post autologous peripheral blood stem cell transplant at [facility] on Nov 20, 2007 under the care of Dr. Valarie Merino. 4. Status post treatment for disease recurrence with Velcade, Doxil and Decadron. Last dose given Nov 09, 2009. Discontinued secondary to intolerance but the patient had a good response to treatment at that time. 5. Status post palliative radiotherapy to the T2-T6 thoracic vertebrae completed 03/21/2011 under the care of Dr. Lisbeth Renshaw. 6. Systemic chemotherapy with Velcade 1.3 mg per meter squared given on days 1, 4, 8 and 11, and Doxil at 30 mg per meter squared given on day 4 in addition to Decadron status post 4 cycles, discontinued secondary to intolerance. 7. Systemic therapy with Velcade 1.3 mg/M2 subcutaneously in addition to Decadron 40 mg by mouth on a weekly basis, status post 20 cycles. The patient had good response with this treatment but it is discontinue today secondary to worsening peripheral neuropathy. 8. Palliative radiotherapy to the skull lesion as well as the left hip area under the care of Dr. Lisbeth Renshaw.  CURRENT THERAPY:   1. Systemic chemotherapy with Carfilzomib 20 mg/M2 on days 1, 2,  8, 9, 13 and 16 every 4 weeks in addition to weekly Decadron 40 mg by mouth. First dose on 04/19/2013. Status post 3 cycles. 2. Zometa 4 mg IV given every 3  months   INTERVAL HISTORY: Sonya SEVERNS 63 y.o. female returns to the clinic today for follow up visit accompanied by her daughter. The patient is feeling fine today except for the aching pain on the lower back as well as the peripheral neuropathy in the lower extremities. She still on Neurontin 600 mg by mouth 4 times a day. She is tolerating her treatment with Carfilzomib, and Decadron fairly well. She denied having any significant weight loss or night sweats. The patient denied having any nausea or vomiting. She has no chest pain, shortness of breath, cough or hemoptysis.   MEDICAL HISTORY: Past Medical History  Diagnosis Date  . Thyroid disease Hypothyroidism  . Hypercholesterolemia   . Compression fracture 04/08/2007    pathologic compression fracture  . Hypothyroidism   . FHx: chemotherapy     s/p 5 cycle revlimid/low dose decadron,s/p velcade,doxil,decadron,  . Hx of radiation therapy 05/05/07-05/18/07,& 03/05/11-03/21/11-    l3&l5 in 2008, t2-t6 03/2011  . GERD (gastroesophageal reflux disease)   . Insomnia     associated with steroids  . Nausea   . Constipation     takes oxycontin,vicodin  . Hx of radiation therapy 05/05/2007 to 05/18/2007    palliative, L3-5  . 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 Hx of radiation therapy 03/05/2011 to 03/21/2011    palliative T2-T6, c-spine  . 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 PONV (postoperative nausea and vomiting)   . Metastasis to bone     ALLERGIES:  has No Known Allergies.  MEDICATIONS:  Current Outpatient Prescriptions  Medication Sig Dispense Refill  . acyclovir (ZOVIRAX) 400 MG tablet Take 1 tablet (400 mg total) by mouth daily.  30 tablet  3  . acyclovir (ZOVIRAX) 400 MG tablet TAKE 1 TABLET BY MOUTH TWICE A DAY TO TAKE AFTER ACYCLOVIR 800MG DONE  60 tablet  1  . ALPRAZolam (XANAX) 0.5 MG tablet Take 0.5 mg by mouth 3 (three) times daily as needed for anxiety.       Marland Kitchen atorvastatin  (LIPITOR) 40 MG tablet Take 1 tablet (40 mg total) by mouth daily.  30 tablet  3  . azithromycin (ZITHROMAX) 250 MG tablet Take 2 tablets (500 mg) on  Day 1,  followed by 1 tablet (250 mg) once daily on Days 2 through 5.  6 each  0  . cyclobenzaprine (FLEXERIL) 10 MG tablet 10 mg.      . dexamethasone (DECADRON) 4 MG tablet Take 40 mg by mouth once a week. Taken on Thursdays.      Marland Kitchen dexamethasone (DECADRON) 4 MG tablet TAKE 10 TABLETS BY MOUTH ON A WEEKLY BASIS STARTING WITH THE FIRST CYCLE OF CHEMOTHERAPY.  80 tablet  2  . ESZOPICLONE 3 MG tablet Take 3 mg by mouth at bedtime.       . furosemide (LASIX) 40 MG tablet Take 40 mg by mouth 2 (two) times daily.      Marland Kitchen gabapentin (NEURONTIN) 600 MG tablet Take 600 mg by mouth 4 (four) times daily.      . hyaluronate sodium (RADIAPLEXRX) GEL Apply 1 application topically 2 (two) times daily. Apply to affected skin area after rad txs and bedtime      . HYDROcodone-acetaminophen (NORCO) 7.5-325 MG per tablet Take 1 tablet by mouth every 6 (six) hours as needed.  30 tablet  0  . levothyroxine (SYNTHROID, LEVOTHROID) 100 MCG tablet Take 100 mcg by mouth daily.       Marland Kitchen lidocaine-prilocaine (EMLA) cream Apply 1 application topically as needed (for port-a-cath access).      Marland Kitchen loperamide (IMODIUM) 2 MG capsule Take 2 mg by mouth 4 (four) times daily as needed for diarrhea or loose stools.      . metFORMIN (GLUCOPHAGE) 500 MG tablet Take 500 mg by mouth daily with breakfast.      . morphine (MS CONTIN) 15 MG 12 hr tablet Take 1 tablet (15 mg total) by mouth 2 (two) times daily.  60 tablet  0  . Multiple Vitamin (MULITIVITAMIN WITH MINERALS) TABS Take 1 tablet by mouth daily.      . ondansetron (ZOFRAN-ODT) 4 MG disintegrating tablet Take 1 tablet by mouth every 8 (eight) hours as needed for nausea.       . pantoprazole (PROTONIX) 40 MG tablet Take 40 mg by mouth daily.      . potassium chloride (K-DUR,KLOR-CON) 10 MEQ tablet Take 1 tablet (10 mEq total) by mouth  2 (two) times daily.  60 tablet  3  . PRESCRIPTION MEDICATION Inject into the vein every 7 (seven) days. Velcade infusion every Thursday.      . promethazine-dextromethorphan (PROMETHAZINE-DM) 6.25-15 MG/5ML syrup Take 5 mLs by mouth 4 (four) times daily as needed for cough.  180 mL  0  . triamcinolone cream (KENALOG) 0.1 % Apply 1 application topically 2 (two) times daily.  85.2 g  3   No current facility-administered medications for this visit.   Facility-Administered Medications Ordered in Other Visits  Medication Dose Route  Frequency Provider Last Rate Last Dose  . 0.9 %  sodium chloride infusion   Intravenous Once Curt Bears, MD      . ondansetron (ZOFRAN) IVPB 8 mg  8 mg Intravenous Once Curt Bears, MD      . sodium chloride 0.9 % injection 10 mL  10 mL Intracatheter PRN Curt Bears, MD        SURGICAL HISTORY:  Past Surgical History  Procedure Laterality Date  . Cholecystectomy    . Knee surgery    . Knee surgery    . Video bronchoscopy  07/30/2011    Procedure: VIDEO BRONCHOSCOPY WITHOUT FLUORO;  Surgeon: Kathee Delton, MD;  Location: [facility];  Service: Cardiopulmonary;  Laterality: Bilateral;    REVIEW OF SYSTEMS:  Constitutional: negative Eyes: negative Ears, nose, mouth, throat, and face: negative Respiratory: negative Cardiovascular: negative Gastrointestinal: negative Genitourinary:negative Integument/breast: negative Hematologic/lymphatic: negative Musculoskeletal:positive for back pain Neurological: positive for seizures Behavioral/Psych: negative Endocrine: negative Allergic/Immunologic: negative   PHYSICAL EXAMINATION: General appearance: alert, cooperative, fatigued and no distress Head: Normocephalic, without obvious abnormality, atraumatic Neck: no adenopathy, no JVD, supple, symmetrical, trachea midline and thyroid not enlarged, symmetric, no tenderness/mass/nodules Lymph nodes: Cervical, supraclavicular, and axillary nodes  normal. Resp: clear to auscultation bilaterally Back: symmetric, no curvature. ROM normal. No CVA tenderness. Cardio: regular rate and rhythm, S1, S2 normal, no murmur, click, rub or gallop GI: soft, non-tender; bowel sounds normal; no masses,  no organomegaly Extremities: extremities normal, atraumatic, no cyanosis or edema Neurologic: Alert and oriented X 3, normal strength and tone. Normal symmetric reflexes. Normal coordination and gait  ECOG PERFORMANCE STATUS: 1 - Symptomatic but completely ambulatory  Blood pressure 134/79, pulse 95, temperature 98.6 F (37 C), temperature source Oral, resp. rate 18, height 5' 6"  (1.676 m), weight 203 lb 6.4 oz (92.262 kg), SpO2 100.00%.  LABORATORY DATA: Lab Results  Component Value Date   WBC 7.0 07/06/2013   HGB 10.2* 07/06/2013   HCT 31.6* 07/06/2013   MCV 100.0 07/06/2013   PLT 190 07/06/2013      Chemistry      Component Value Date/Time   NA 139 06/29/2013 1031   NA 138 05/04/2013 0530   K 3.6 06/29/2013 1031   K 3.9 05/04/2013 0530   CL 105 05/04/2013 0530   CL 105 12/17/2012 1015   CO2 24 06/29/2013 1031   CO2 25 05/04/2013 0530   BUN 11.5 06/29/2013 1031   BUN 9 05/04/2013 0530   CREATININE 0.8 06/29/2013 1031   CREATININE 0.68 05/04/2013 0530      Component Value Date/Time   CALCIUM 9.0 06/29/2013 1031   CALCIUM 8.7 05/04/2013 0530   ALKPHOS 126 06/29/2013 1031   ALKPHOS 78 02/24/2013 1500   AST 11 06/29/2013 1031   AST 22 02/24/2013 1500   ALT 19 06/29/2013 1031   ALT 26 02/24/2013 1500   BILITOT 1.07 06/29/2013 1031   BILITOT 0.4 02/24/2013 1500       RADIOGRAPHIC STUDIES:  ASSESSMENT AND PLAN:  This is a very pleasant 63 years old [place] American female with recurrent multiple myeloma recently completed a course of treatment with Velcade and Decadron with improvement in her disease but this was discontinued secondary to peripheral neuropathy. The patient is tolerating her treatment with Carfilzomib and Decadron fairly well. She  will start cycle #4 of her treatment next week. The patient would come back for followup visit in 3 weeks with repeat myeloma panel for evaluation of her disease and response  to the treatment. She was advised to call immediately if she has any concerning symptoms in the interval. The patient voices understanding of current disease status and treatment options and is in agreement with the current care plan.  All questions were answered. The patient knows to call the clinic with any problems, questions or concerns. We can certainly see the patient much sooner if necessary.

## 2013-07-06 NOTE — Telephone Encounter (Signed)
Per staff message and POF I have scheduled appts.  JMW  

## 2013-07-06 NOTE — Progress Notes (Signed)
No treatment today due to being one week early.

## 2013-07-06 NOTE — Telephone Encounter (Signed)
worked 1/6 POF sw Sharyn Lull Wheat who is going to have to get in touch w Dr to verify tx dates and details Gave AVs and CAL to pt and sent pt to Tx today Adv pt appts will be changing depending on doctor's orders shh

## 2013-07-07 ENCOUNTER — Ambulatory Visit: Payer: Medicare Other

## 2013-07-07 ENCOUNTER — Telehealth: Payer: Self-pay | Admitting: Internal Medicine

## 2013-07-07 NOTE — Telephone Encounter (Signed)
sw pt adv schedule for rest of Jan and Feb now available Calendars mailed to pt at pts request shh

## 2013-07-07 NOTE — Telephone Encounter (Signed)
appts adjusted after MDale added Feb Txts AVS and Jan and Feb calendars taken to pt in Tx area shh

## 2013-07-12 ENCOUNTER — Other Ambulatory Visit: Payer: Self-pay | Admitting: Physician Assistant

## 2013-07-12 ENCOUNTER — Other Ambulatory Visit: Payer: Self-pay | Admitting: Internal Medicine

## 2013-07-12 DIAGNOSIS — C9000 Multiple myeloma not having achieved remission: Secondary | ICD-10-CM

## 2013-07-12 MED ORDER — DEXLANSOPRAZOLE 60 MG PO CPDR: 60.0000 mg | DELAYED_RELEASE_CAPSULE | Freq: Every day | ORAL | Status: DC

## 2013-07-13 ENCOUNTER — Other Ambulatory Visit (HOSPITAL_BASED_OUTPATIENT_CLINIC_OR_DEPARTMENT_OTHER): Payer: Medicare Other

## 2013-07-13 ENCOUNTER — Other Ambulatory Visit: Payer: Medicare Other

## 2013-07-13 ENCOUNTER — Ambulatory Visit (HOSPITAL_BASED_OUTPATIENT_CLINIC_OR_DEPARTMENT_OTHER): Payer: Medicare Other

## 2013-07-13 VITALS — BP 125/67 | HR 98 | Temp 98.2°F | Resp 18

## 2013-07-13 DIAGNOSIS — C9002 Multiple myeloma in relapse: Secondary | ICD-10-CM

## 2013-07-13 DIAGNOSIS — C9000 Multiple myeloma not having achieved remission: Secondary | ICD-10-CM

## 2013-07-13 DIAGNOSIS — Z5112 Encounter for antineoplastic immunotherapy: Secondary | ICD-10-CM

## 2013-07-13 LAB — COMPREHENSIVE METABOLIC PANEL (CC13)
ALBUMIN: 3.3 g/dL — AB (ref 3.5–5.0)
ALT: 21 U/L (ref 0–55)
ANION GAP: 11 meq/L (ref 3–11)
AST: 9 U/L (ref 5–34)
Alkaline Phosphatase: 122 U/L (ref 40–150)
BUN: 14 mg/dL (ref 7.0–26.0)
CO2: 24 meq/L (ref 22–29)
CREATININE: 0.9 mg/dL (ref 0.6–1.1)
Calcium: 9.9 mg/dL (ref 8.4–10.4)
Chloride: 105 mEq/L (ref 98–109)
Glucose: 134 mg/dl (ref 70–140)
POTASSIUM: 3.7 meq/L (ref 3.5–5.1)
Sodium: 141 mEq/L (ref 136–145)
Total Bilirubin: 0.76 mg/dL (ref 0.20–1.20)
Total Protein: 7.2 g/dL (ref 6.4–8.3)

## 2013-07-13 LAB — CBC WITH DIFFERENTIAL/PLATELET
BASO%: 0.3 % (ref 0.0–2.0)
Basophils Absolute: 0 10*3/uL (ref 0.0–0.1)
EOS ABS: 0 10*3/uL (ref 0.0–0.5)
EOS%: 0.4 % (ref 0.0–7.0)
HEMATOCRIT: 33.1 % — AB (ref 34.8–46.6)
HEMOGLOBIN: 10.9 g/dL — AB (ref 11.6–15.9)
LYMPH#: 0.7 10*3/uL — AB (ref 0.9–3.3)
LYMPH%: 7 % — ABNORMAL LOW (ref 14.0–49.7)
MCH: 33.3 pg (ref 25.1–34.0)
MCHC: 32.8 g/dL (ref 31.5–36.0)
MCV: 101.4 fL — ABNORMAL HIGH (ref 79.5–101.0)
MONO#: 0.2 10*3/uL (ref 0.1–0.9)
MONO%: 2.4 % (ref 0.0–14.0)
NEUT%: 89.9 % — AB (ref 38.4–76.8)
NEUTROS ABS: 8.4 10*3/uL — AB (ref 1.5–6.5)
Platelets: 237 10*3/uL (ref 145–400)
RBC: 3.26 10*6/uL — ABNORMAL LOW (ref 3.70–5.45)
RDW: 17.1 % — AB (ref 11.2–14.5)
WBC: 9.3 10*3/uL (ref 3.9–10.3)

## 2013-07-13 MED ORDER — DEXTROSE 5 % IV SOLN
27.0000 mg/m2 | Freq: Once | INTRAVENOUS | Status: AC
  Administered 2013-07-13: 56 mg via INTRAVENOUS
  Filled 2013-07-13: qty 28

## 2013-07-13 MED ORDER — DEXAMETHASONE SODIUM PHOSPHATE 10 MG/ML IJ SOLN
10.0000 mg | Freq: Once | INTRAMUSCULAR | Status: AC
  Administered 2013-07-13: 10 mg via INTRAVENOUS

## 2013-07-13 MED ORDER — DEXAMETHASONE SODIUM PHOSPHATE 10 MG/ML IJ SOLN
INTRAMUSCULAR | Status: AC
  Filled 2013-07-13: qty 1

## 2013-07-13 MED ORDER — SODIUM CHLORIDE 0.9 % IJ SOLN
10.0000 mL | INTRAMUSCULAR | Status: DC | PRN
  Administered 2013-07-13: 10 mL
  Filled 2013-07-13: qty 10

## 2013-07-13 MED ORDER — SODIUM CHLORIDE 0.9 % IV SOLN: Freq: Once | INTRAVENOUS | Status: DC

## 2013-07-13 MED ORDER — SODIUM CHLORIDE 0.9 % IV SOLN
Freq: Once | INTRAVENOUS | Status: AC
  Administered 2013-07-13: 14:00:00 via INTRAVENOUS

## 2013-07-13 MED ORDER — ONDANSETRON 8 MG/50ML IVPB (CHCC)
8.0000 mg | Freq: Once | INTRAVENOUS | Status: AC
  Administered 2013-07-13: 8 mg via INTRAVENOUS

## 2013-07-13 MED ORDER — ONDANSETRON 8 MG/NS 50 ML IVPB
INTRAVENOUS | Status: AC
  Filled 2013-07-13: qty 8

## 2013-07-13 MED ORDER — HEPARIN SOD (PORK) LOCK FLUSH 100 UNIT/ML IV SOLN
500.0000 [IU] | Freq: Once | INTRAVENOUS | Status: AC | PRN
  Administered 2013-07-13: 500 [IU]
  Filled 2013-07-13: qty 5

## 2013-07-13 NOTE — Patient Instructions (Signed)
[name] Discharge Instructions for Patients Receiving Chemotherapy  Today you received the following chemotherapy agents Kyprolis.  To help prevent nausea and vomiting after your treatment, we encourage you to take your nausea medication as prescribed.   If you develop nausea and vomiting that is not controlled by your nausea medication, call the clinic.   BELOW ARE SYMPTOMS THAT SHOULD BE REPORTED IMMEDIATELY:  *FEVER GREATER THAN 100.5 F  *CHILLS WITH OR WITHOUT FEVER  NAUSEA AND VOMITING THAT IS NOT CONTROLLED WITH YOUR NAUSEA MEDICATION  *UNUSUAL SHORTNESS OF BREATH  *UNUSUAL BRUISING OR BLEEDING  TENDERNESS IN MOUTH AND THROAT WITH OR WITHOUT PRESENCE OF ULCERS  *URINARY PROBLEMS  *BOWEL PROBLEMS  UNUSUAL RASH Items with * indicate a potential emergency and should be followed up as soon as possible.  Feel free to call the clinic you have any questions or concerns. The clinic phone number is [phone].

## 2013-07-14 ENCOUNTER — Ambulatory Visit (HOSPITAL_BASED_OUTPATIENT_CLINIC_OR_DEPARTMENT_OTHER): Payer: Medicare Other

## 2013-07-14 VITALS — BP 142/68 | HR 90 | Temp 98.4°F

## 2013-07-14 DIAGNOSIS — Z5112 Encounter for antineoplastic immunotherapy: Secondary | ICD-10-CM

## 2013-07-14 DIAGNOSIS — C9002 Multiple myeloma in relapse: Secondary | ICD-10-CM

## 2013-07-14 DIAGNOSIS — C9000 Multiple myeloma not having achieved remission: Secondary | ICD-10-CM

## 2013-07-14 MED ORDER — DEXTROSE 5 % IV SOLN
27.0000 mg/m2 | Freq: Once | INTRAVENOUS | Status: AC
  Administered 2013-07-14: 56 mg via INTRAVENOUS
  Filled 2013-07-14: qty 28

## 2013-07-14 MED ORDER — SODIUM CHLORIDE 0.9 % IJ SOLN
10.0000 mL | INTRAMUSCULAR | Status: DC | PRN
  Administered 2013-07-14: 10 mL
  Filled 2013-07-14: qty 10

## 2013-07-14 MED ORDER — SODIUM CHLORIDE 0.9 % IV SOLN
Freq: Once | INTRAVENOUS | Status: AC
  Administered 2013-07-14: 13:00:00 via INTRAVENOUS

## 2013-07-14 MED ORDER — ONDANSETRON 8 MG/NS 50 ML IVPB
INTRAVENOUS | Status: AC
  Filled 2013-07-14: qty 8

## 2013-07-14 MED ORDER — DEXAMETHASONE SODIUM PHOSPHATE 10 MG/ML IJ SOLN
INTRAMUSCULAR | Status: AC
  Filled 2013-07-14: qty 1

## 2013-07-14 MED ORDER — DEXAMETHASONE SODIUM PHOSPHATE 10 MG/ML IJ SOLN
10.0000 mg | Freq: Once | INTRAMUSCULAR | Status: AC
  Administered 2013-07-14: 10 mg via INTRAVENOUS

## 2013-07-14 MED ORDER — ONDANSETRON 8 MG/50ML IVPB (CHCC)
8.0000 mg | Freq: Once | INTRAVENOUS | Status: AC
  Administered 2013-07-14: 8 mg via INTRAVENOUS

## 2013-07-14 MED ORDER — HEPARIN SOD (PORK) LOCK FLUSH 100 UNIT/ML IV SOLN
500.0000 [IU] | Freq: Once | INTRAVENOUS | Status: AC | PRN
  Administered 2013-07-14: 500 [IU]
  Filled 2013-07-14: qty 5

## 2013-07-14 NOTE — Patient Instructions (Signed)
[facility] Discharge Instructions for Patients Receiving Chemotherapy  Today you received the following chemotherapy agents Kyprolis.  To help prevent nausea and vomiting after your treatment, we encourage you to take your nausea medication as prescribed.   If you develop nausea and vomiting that is not controlled by your nausea medication, call the clinic.   BELOW ARE SYMPTOMS THAT SHOULD BE REPORTED IMMEDIATELY:  *FEVER GREATER THAN 100.5 F  *CHILLS WITH OR WITHOUT FEVER  NAUSEA AND VOMITING THAT IS NOT CONTROLLED WITH YOUR NAUSEA MEDICATION  *UNUSUAL SHORTNESS OF BREATH  *UNUSUAL BRUISING OR BLEEDING  TENDERNESS IN MOUTH AND THROAT WITH OR WITHOUT PRESENCE OF ULCERS  *URINARY PROBLEMS  *BOWEL PROBLEMS  UNUSUAL RASH Items with * indicate a potential emergency and should be followed up as soon as possible.  Feel free to call the clinic you have any questions or concerns. The clinic phone number is [phone].

## 2013-07-19 ENCOUNTER — Other Ambulatory Visit: Payer: Self-pay | Admitting: Medical Oncology

## 2013-07-19 DIAGNOSIS — C9000 Multiple myeloma not having achieved remission: Secondary | ICD-10-CM

## 2013-07-19 MED ORDER — MORPHINE SULFATE ER 15 MG PO TBCR: 15.0000 mg | EXTENDED_RELEASE_TABLET | Freq: Two times a day (BID) | ORAL | Status: DC

## 2013-07-19 NOTE — Telephone Encounter (Signed)
Rx locked in injection room.

## 2013-07-20 ENCOUNTER — Other Ambulatory Visit (HOSPITAL_BASED_OUTPATIENT_CLINIC_OR_DEPARTMENT_OTHER): Payer: Medicare Other

## 2013-07-20 ENCOUNTER — Ambulatory Visit: Payer: Medicare Other | Admitting: Physician Assistant

## 2013-07-20 ENCOUNTER — Other Ambulatory Visit: Payer: Medicare Other

## 2013-07-20 ENCOUNTER — Ambulatory Visit (HOSPITAL_BASED_OUTPATIENT_CLINIC_OR_DEPARTMENT_OTHER): Payer: Medicare Other

## 2013-07-20 VITALS — BP 139/81 | HR 86 | Temp 97.1°F | Resp 18

## 2013-07-20 DIAGNOSIS — C9000 Multiple myeloma not having achieved remission: Secondary | ICD-10-CM

## 2013-07-20 DIAGNOSIS — Z5112 Encounter for antineoplastic immunotherapy: Secondary | ICD-10-CM

## 2013-07-20 DIAGNOSIS — C9002 Multiple myeloma in relapse: Secondary | ICD-10-CM

## 2013-07-20 LAB — CBC WITH DIFFERENTIAL/PLATELET
BASO%: 0.3 % (ref 0.0–2.0)
BASOS ABS: 0 10*3/uL (ref 0.0–0.1)
EOS%: 0 % (ref 0.0–7.0)
Eosinophils Absolute: 0 10*3/uL (ref 0.0–0.5)
HEMATOCRIT: 30.8 % — AB (ref 34.8–46.6)
HGB: 10.2 g/dL — ABNORMAL LOW (ref 11.6–15.9)
LYMPH%: 7.3 % — ABNORMAL LOW (ref 14.0–49.7)
MCH: 33.6 pg (ref 25.1–34.0)
MCHC: 33 g/dL (ref 31.5–36.0)
MCV: 101.8 fL — AB (ref 79.5–101.0)
MONO#: 0.1 10*3/uL (ref 0.1–0.9)
MONO%: 1 % (ref 0.0–14.0)
NEUT#: 8 10*3/uL — ABNORMAL HIGH (ref 1.5–6.5)
NEUT%: 91.4 % — AB (ref 38.4–76.8)
Platelets: 191 10*3/uL (ref 145–400)
RBC: 3.03 10*6/uL — ABNORMAL LOW (ref 3.70–5.45)
RDW: 17.4 % — ABNORMAL HIGH (ref 11.2–14.5)
WBC: 8.8 10*3/uL (ref 3.9–10.3)
lymph#: 0.6 10*3/uL — ABNORMAL LOW (ref 0.9–3.3)

## 2013-07-20 LAB — COMPREHENSIVE METABOLIC PANEL (CC13)
ALK PHOS: 103 U/L (ref 40–150)
ALT: 30 U/L (ref 0–55)
AST: 22 U/L (ref 5–34)
Albumin: 3.3 g/dL — ABNORMAL LOW (ref 3.5–5.0)
Anion Gap: 12 mEq/L — ABNORMAL HIGH (ref 3–11)
BILIRUBIN TOTAL: 0.79 mg/dL (ref 0.20–1.20)
BUN: 13.4 mg/dL (ref 7.0–26.0)
CALCIUM: 9.3 mg/dL (ref 8.4–10.4)
CHLORIDE: 107 meq/L (ref 98–109)
CO2: 23 mEq/L (ref 22–29)
CREATININE: 0.8 mg/dL (ref 0.6–1.1)
Glucose: 152 mg/dl — ABNORMAL HIGH (ref 70–140)
Potassium: 3.6 mEq/L (ref 3.5–5.1)
Sodium: 142 mEq/L (ref 136–145)
Total Protein: 6.9 g/dL (ref 6.4–8.3)

## 2013-07-20 MED ORDER — ONDANSETRON 8 MG/NS 50 ML IVPB
INTRAVENOUS | Status: AC
  Filled 2013-07-20: qty 8

## 2013-07-20 MED ORDER — CARFILZOMIB CHEMO INJECTION 60 MG
27.0000 mg/m2 | Freq: Once | INTRAVENOUS | Status: AC
  Administered 2013-07-20: 56 mg via INTRAVENOUS
  Filled 2013-07-20: qty 28

## 2013-07-20 MED ORDER — SODIUM CHLORIDE 0.9 % IJ SOLN
10.0000 mL | INTRAMUSCULAR | Status: DC | PRN
  Administered 2013-07-20: 10 mL
  Filled 2013-07-20: qty 10

## 2013-07-20 MED ORDER — SODIUM CHLORIDE 0.9 % IV SOLN
Freq: Once | INTRAVENOUS | Status: AC
  Administered 2013-07-20: 16:00:00 via INTRAVENOUS

## 2013-07-20 MED ORDER — HEPARIN SOD (PORK) LOCK FLUSH 100 UNIT/ML IV SOLN
500.0000 [IU] | Freq: Once | INTRAVENOUS | Status: AC | PRN
  Administered 2013-07-20: 500 [IU]
  Filled 2013-07-20: qty 5

## 2013-07-20 MED ORDER — DEXAMETHASONE SODIUM PHOSPHATE 10 MG/ML IJ SOLN
INTRAMUSCULAR | Status: AC
  Filled 2013-07-20: qty 1

## 2013-07-20 MED ORDER — ONDANSETRON 8 MG/50ML IVPB (CHCC)
8.0000 mg | Freq: Once | INTRAVENOUS | Status: AC
  Administered 2013-07-20: 8 mg via INTRAVENOUS

## 2013-07-20 MED ORDER — DEXAMETHASONE SODIUM PHOSPHATE 10 MG/ML IJ SOLN
10.0000 mg | Freq: Once | INTRAMUSCULAR | Status: AC
  Administered 2013-07-20: 10 mg via INTRAVENOUS

## 2013-07-20 NOTE — Progress Notes (Signed)
Patient reported "dark black stools" for several days and 2 days of "bight red stools" over the weekend. Dr Julien Nordmann notified and ok to precede with treatment today. Per MD, if patient has anymore bright red stools, she needs to report to the emergency room. Patient notified of this and agreeable to this. Cindi Carbon, RN

## 2013-07-20 NOTE — Patient Instructions (Signed)
[facility] Discharge Instructions for Patients Receiving Chemotherapy  Today you received the following chemotherapy agents: Kyprolis  To help prevent nausea and vomiting after your treatment, we encourage you to take your nausea medication as prescribed.  If you develop nausea and vomiting that is not controlled by your nausea medication, call the clinic.   BELOW ARE SYMPTOMS THAT SHOULD BE REPORTED IMMEDIATELY:  *FEVER GREATER THAN 100.5 F  *CHILLS WITH OR WITHOUT FEVER  NAUSEA AND VOMITING THAT IS NOT CONTROLLED WITH YOUR NAUSEA MEDICATION  *UNUSUAL SHORTNESS OF BREATH  *UNUSUAL BRUISING OR BLEEDING  TENDERNESS IN MOUTH AND THROAT WITH OR WITHOUT PRESENCE OF ULCERS  *URINARY PROBLEMS  *BOWEL PROBLEMS  UNUSUAL RASH Items with * indicate a potential emergency and should be followed up as soon as possible.  Feel free to call the clinic you have any questions or concerns. The clinic phone number is [phone].

## 2013-07-21 ENCOUNTER — Ambulatory Visit (HOSPITAL_BASED_OUTPATIENT_CLINIC_OR_DEPARTMENT_OTHER): Payer: Medicare Other

## 2013-07-21 VITALS — BP 153/62 | HR 99 | Temp 98.5°F | Resp 20

## 2013-07-21 DIAGNOSIS — C9002 Multiple myeloma in relapse: Secondary | ICD-10-CM

## 2013-07-21 DIAGNOSIS — Z5112 Encounter for antineoplastic immunotherapy: Secondary | ICD-10-CM

## 2013-07-21 DIAGNOSIS — C9000 Multiple myeloma not having achieved remission: Secondary | ICD-10-CM

## 2013-07-21 MED ORDER — HEPARIN SOD (PORK) LOCK FLUSH 100 UNIT/ML IV SOLN
500.0000 [IU] | Freq: Once | INTRAVENOUS | Status: AC | PRN
  Administered 2013-07-21: 500 [IU]
  Filled 2013-07-21: qty 5

## 2013-07-21 MED ORDER — SODIUM CHLORIDE 0.9 % IV SOLN
Freq: Once | INTRAVENOUS | Status: AC
  Administered 2013-07-21: 16:00:00 via INTRAVENOUS

## 2013-07-21 MED ORDER — DEXAMETHASONE SODIUM PHOSPHATE 10 MG/ML IJ SOLN
10.0000 mg | Freq: Once | INTRAMUSCULAR | Status: AC
  Administered 2013-07-21: 10 mg via INTRAVENOUS

## 2013-07-21 MED ORDER — DEXAMETHASONE SODIUM PHOSPHATE 10 MG/ML IJ SOLN
INTRAMUSCULAR | Status: AC
Start: 2013-07-21 — End: 2013-07-21
  Filled 2013-07-21: qty 1

## 2013-07-21 MED ORDER — SODIUM CHLORIDE 0.9 % IJ SOLN
10.0000 mL | INTRAMUSCULAR | Status: DC | PRN
  Administered 2013-07-21: 10 mL
  Filled 2013-07-21: qty 10

## 2013-07-21 MED ORDER — ONDANSETRON 8 MG/NS 50 ML IVPB
INTRAVENOUS | Status: AC
  Filled 2013-07-21: qty 8

## 2013-07-21 MED ORDER — ONDANSETRON 8 MG/50ML IVPB (CHCC)
8.0000 mg | Freq: Once | INTRAVENOUS | Status: AC
  Administered 2013-07-21: 8 mg via INTRAVENOUS

## 2013-07-21 MED ORDER — SODIUM CHLORIDE 0.9 % IV SOLN
Freq: Once | INTRAVENOUS | Status: AC
  Administered 2013-07-21: 15:00:00 via INTRAVENOUS

## 2013-07-21 MED ORDER — DEXTROSE 5 % IV SOLN
27.0000 mg/m2 | Freq: Once | INTRAVENOUS | Status: AC
  Administered 2013-07-21: 56 mg via INTRAVENOUS
  Filled 2013-07-21: qty 28

## 2013-07-22 ENCOUNTER — Encounter: Payer: Self-pay | Admitting: Emergency Medicine

## 2013-07-22 ENCOUNTER — Ambulatory Visit (INDEPENDENT_AMBULATORY_CARE_PROVIDER_SITE_OTHER): Payer: Medicare Other | Admitting: Emergency Medicine

## 2013-07-22 VITALS — BP 128/70 | Temp 98.2°F | Resp 18 | Ht 64.5 in | Wt 204.0 lb

## 2013-07-22 DIAGNOSIS — R197 Diarrhea, unspecified: Secondary | ICD-10-CM

## 2013-07-22 DIAGNOSIS — E538 Deficiency of other specified B group vitamins: Secondary | ICD-10-CM

## 2013-07-22 DIAGNOSIS — Z0000 Encounter for general adult medical examination without abnormal findings: Secondary | ICD-10-CM

## 2013-07-22 DIAGNOSIS — I10 Essential (primary) hypertension: Secondary | ICD-10-CM

## 2013-07-22 DIAGNOSIS — R350 Frequency of micturition: Secondary | ICD-10-CM

## 2013-07-22 DIAGNOSIS — E559 Vitamin D deficiency, unspecified: Secondary | ICD-10-CM

## 2013-07-22 DIAGNOSIS — E782 Mixed hyperlipidemia: Secondary | ICD-10-CM

## 2013-07-22 DIAGNOSIS — R5383 Other fatigue: Secondary | ICD-10-CM

## 2013-07-22 DIAGNOSIS — E119 Type 2 diabetes mellitus without complications: Secondary | ICD-10-CM

## 2013-07-22 DIAGNOSIS — IMO0001 Reserved for inherently not codable concepts without codable children: Secondary | ICD-10-CM

## 2013-07-22 DIAGNOSIS — D649 Anemia, unspecified: Secondary | ICD-10-CM

## 2013-07-22 DIAGNOSIS — R5381 Other malaise: Secondary | ICD-10-CM

## 2013-07-22 LAB — HEMOGLOBIN A1C
HEMOGLOBIN A1C: 5.9 % — AB (ref <5.7)
MEAN PLASMA GLUCOSE: 123 mg/dL — AB (ref <117)

## 2013-07-22 NOTE — Patient Instructions (Signed)
Diarrhea Diarrhea is watery poop (stool). It can make you feel weak, tired, thirsty, or give you a dry mouth (signs of dehydration). Watery poop is a sign of another problem, most often an infection. It often lasts 2 3 days. It can last longer if it is a sign of something serious. Take care of yourself as told by your doctor. HOME CARE   Drink 1 cup (8 ounces) of fluid each time you have watery poop.  Do not drink the following fluids:  Those that contain simple sugars (fructose, glucose, galactose, lactose, sucrose, maltose).  Sports drinks.  Fruit juices.  Whole milk products.  Sodas.  Drinks with caffeine (coffee, tea, soda) or alcohol.  Oral rehydration solution may be used if the doctor says it is okay. You may make your own solution. Follow this recipe:    teaspoon table salt.   teaspoon baking soda.   teaspoon salt substitute containing potassium chloride.  1 tablespoons sugar.  1 liter (34 ounces) of water.  Avoid the following foods:  High fiber foods, such as raw fruits and vegetables.  Nuts, seeds, and whole grain breads and cereals.   Those that are sweetened with sugar alcohols (xylitol, sorbitol, mannitol).  Try eating the following foods:  Starchy foods, such as rice, toast, pasta, low-sugar cereal, oatmeal, baked potatoes, crackers, and bagels.  Bananas.  Applesauce.  Eat probiotic-rich foods, such as yogurt and milk products that are fermented.  Wash your hands well after each time you have watery poop.  Only take medicine as told by your doctor.  Take a warm bath to help lessen burning or pain from having watery poop. GET HELP RIGHT AWAY IF:   You cannot drink fluids without throwing up (vomiting).  You keep throwing up.  You have blood in your poop, or your poop looks black and tarry.  You do not pee (urinate) in 6 8 hours, or there is only a small amount of very dark pee.  You have belly (abdominal) pain that gets worse or stays in  the same spot (localizes).  You are weak, dizzy, confused, or lightheaded.  You have a very bad headache.  Your watery poop gets worse or does not get better.  You have a fever or lasting symptoms for more than 2 3 days.  You have a fever and your symptoms suddenly get worse. MAKE SURE YOU:   Understand these instructions.  Will watch your condition.  Will get help right away if you are not doing well or get worse. Document Released: 12/04/2007 Document Revised: 03/11/2012 Document Reviewed: 02/23/2012 [facility] Patient Information 2014 [place], [place].

## 2013-07-22 NOTE — Progress Notes (Signed)
Subjective:    Patient ID: [name], female    DOB: [birth year], 63 y.o.   MRN: [id number]  HPI Comments: 63 yo AAF CPE AND presents for 3 month F/U for HTN, Cholesterol, DM, D. Deficient. SHE IS EATING HEALTHY BUT HAS DECREASED APPETITIE WITH CHEMO for Mutiple Myeloma. She is currently in treatment for recurrence with Dr Earlie Server. She notes recent scans show new lesions of skull but no brain involvement. She notes she is in chronic pain with the spinal lesions and nerve damage. She tries to take her pain medicine AD to dull the pain and allow her to be more productive. She is not exercising routinely due to the pain and numbness.  She has had diarrhea since starting Chemo x years but she has noticed it has increased over last several weeks. She notes she is now having bowel incontinence. She has HX of RED stool last week then turn dark again, she notes this is her normal with iron tabs. She notes she is staying dehydrated due to diarrhea. She notes it is not smelling now but initially was horrible smell, noticed with new chemo x 3 months. She notes several recent ER evaluations/ treatment for dehydration. She had recent colonoscopy which was WNL.  She notes she always has leg cramps and neuralgias since myeloma and chemo. She denies any changes of these symptoms.  She still has numbness in right hand from CVA and notes occasional difficulty with ADL. She did complete PT/ OT for this issue and has been trying to do some of those exercises at home.  She notes hoarseness and stomach has improved with Dexilant and ABX from ENT. She notes was diagnosed with chronic infection/ inflammation. She had neg EGD in past.   She is up to date on all screening exams.   Hyperlipidemia Associated symptoms include myalgias.  Gastrophageal Reflux Associated symptoms include fatigue.      Review of Systems  Constitutional: Positive for fatigue.  HENT: Positive for voice change.   Eyes:       2014  WNL  Respiratory:       CXR 08/2011  Cardiovascular:       CRENSHAW PRN  Gastrointestinal: Positive for diarrhea.       GANEM/ EDWARDS PRN  Genitourinary:       HYST WITH NEG PAP > 5  YEARS AGO MANUAL EXAM 2014 WNL  Musculoskeletal: Positive for arthralgias, back pain and myalgias.       YATES PRN  Neurological: Positive for numbness.  Hematological:       MOHAMMED/ MOODY/ GABRIEL  All other systems reviewed and are negative.   BP 128/70  Temp(Src) 98.2 F (36.8 C) (Temporal)  Resp 18  Ht 5' 4.5" (1.638 m)  Wt 204 lb (92.534 kg)  BMI 34.49 kg/m2     Objective:   Physical Exam  Nursing note and vitals reviewed. Constitutional: She is oriented to person, place, and time. She appears well-developed and well-nourished. No distress.  HENT:  Head: Normocephalic and atraumatic.  Right Ear: External ear normal.  Left Ear: External ear normal.  Nose: Nose normal.  Mouth/Throat: Oropharynx is clear and moist. No oropharyngeal exudate.  Mild hoarseness but + improvement  Eyes: Conjunctivae and EOM are normal. Pupils are equal, round, and reactive to light. Right eye exhibits no discharge. Left eye exhibits no discharge. No scleral icterus.  Neck: Normal range of motion. Neck supple. No JVD present. No tracheal deviation present. No thyromegaly present.  Cardiovascular:  Normal rate, regular rhythm, normal heart sounds and intact distal pulses.   Pulmonary/Chest: Effort normal and breath sounds normal.  Abdominal: Soft. Bowel sounds are normal. She exhibits no distension and no mass. There is no tenderness. There is no rebound and no guarding.  Genitourinary:  Breasts WNL  Musculoskeletal: Normal range of motion. She exhibits no edema and no tenderness.  Lymphadenopathy:    She has no cervical adenopathy.  Neurological: She is alert and oriented to person, place, and time. She has normal reflexes. No cranial nerve deficit. She exhibits normal muscle tone. Coordination normal.   Skin: Skin is warm and dry. No rash noted. No erythema. No pallor.  Both feet dry scaling  Psychiatric: She has a normal mood and affect. Her behavior is normal. Judgment and thought content normal.      EKG [place]:  1. CPE and 1.  3 month F/U for HTN, Cholesterol,DM, Anemia, D. Deficient. Needs healthy diet, cardio QD and obtain healthy weight. Check Labs, Check BP if >130/80 call office, Check BS if >200 call office 2. Diarrhea and urine frequency- check labs, increase H2o and protein in diet. May be SE of chemo vs Iron, if continues w/c for GI eval 3. Myalgias/ neuropathy from MM, and Fatigue- check labs, increase activity and H2O, try piliates/ yoga. Keep MM f/u 4. Dry skin- Vaseline wraps especially at qhs

## 2013-07-23 LAB — URINALYSIS, ROUTINE W REFLEX MICROSCOPIC
BILIRUBIN URINE: NEGATIVE
GLUCOSE, UA: NEGATIVE mg/dL
Hgb urine dipstick: NEGATIVE
Ketones, ur: NEGATIVE mg/dL
Leukocytes, UA: NEGATIVE
Nitrite: NEGATIVE
PROTEIN: NEGATIVE mg/dL
Specific Gravity, Urine: 1.012 (ref 1.005–1.030)
Urobilinogen, UA: 0.2 mg/dL (ref 0.0–1.0)
pH: 5.5 (ref 5.0–8.0)

## 2013-07-23 LAB — LIPID PANEL
CHOLESTEROL: 135 mg/dL (ref 0–200)
HDL: 57 mg/dL (ref >39)
LDL Cholesterol: 42 mg/dL (ref 0–99)
TRIGLYCERIDES: 180 mg/dL — AB (ref <150)
Total CHOL/HDL Ratio: 2.4 Ratio
VLDL: 36 mg/dL (ref 0–40)

## 2013-07-23 LAB — MICROALBUMIN / CREATININE URINE RATIO
Creatinine, Urine: 67.8 mg/dL
Microalb Creat Ratio: 7.4 mg/g (ref 0.0–30.0)
Microalb, Ur: 0.5 mg/dL (ref 0.00–1.89)

## 2013-07-23 LAB — IRON AND TIBC
%SAT: 37 % (ref 20–55)
IRON: 127 ug/dL (ref 42–145)
TIBC: 339 ug/dL (ref 250–470)
UIBC: 212 ug/dL (ref 125–400)

## 2013-07-23 LAB — TSH: TSH: 0.255 u[IU]/mL — AB (ref 0.350–4.500)

## 2013-07-23 LAB — VITAMIN B12: VITAMIN B 12: 787 pg/mL (ref 211–911)

## 2013-07-23 LAB — MAGNESIUM: MAGNESIUM: 1.7 mg/dL (ref 1.5–2.5)

## 2013-07-23 LAB — CK: Total CK: 20 U/L (ref 7–177)

## 2013-07-23 LAB — INSULIN, FASTING: INSULIN FASTING, SERUM: 32 u[IU]/mL — AB (ref 3–28)

## 2013-07-23 LAB — VITAMIN D 25 HYDROXY (VIT D DEFICIENCY, FRACTURES): VIT D 25 HYDROXY: 41 ng/mL (ref 30–89)

## 2013-07-23 LAB — FOLATE RBC: RBC Folate: 1119 ng/mL (ref >280)

## 2013-07-24 LAB — URINE CULTURE
Colony Count: NO GROWTH
Organism ID, Bacteria: NO GROWTH

## 2013-07-27 ENCOUNTER — Encounter: Payer: Self-pay | Admitting: Physician Assistant

## 2013-07-27 ENCOUNTER — Telehealth: Payer: Self-pay | Admitting: Internal Medicine

## 2013-07-27 ENCOUNTER — Other Ambulatory Visit (HOSPITAL_BASED_OUTPATIENT_CLINIC_OR_DEPARTMENT_OTHER): Payer: Medicare Other

## 2013-07-27 ENCOUNTER — Other Ambulatory Visit: Payer: Medicare Other

## 2013-07-27 ENCOUNTER — Ambulatory Visit (HOSPITAL_BASED_OUTPATIENT_CLINIC_OR_DEPARTMENT_OTHER): Payer: Medicare Other | Admitting: Physician Assistant

## 2013-07-27 ENCOUNTER — Ambulatory Visit (HOSPITAL_BASED_OUTPATIENT_CLINIC_OR_DEPARTMENT_OTHER): Payer: Medicare Other

## 2013-07-27 VITALS — BP 138/79 | HR 102 | Temp 97.9°F | Resp 18 | Ht 64.0 in | Wt 204.8 lb

## 2013-07-27 DIAGNOSIS — Z5112 Encounter for antineoplastic immunotherapy: Secondary | ICD-10-CM

## 2013-07-27 DIAGNOSIS — C9002 Multiple myeloma in relapse: Secondary | ICD-10-CM

## 2013-07-27 DIAGNOSIS — M25552 Pain in left hip: Secondary | ICD-10-CM

## 2013-07-27 DIAGNOSIS — G5790 Unspecified mononeuropathy of unspecified lower limb: Secondary | ICD-10-CM

## 2013-07-27 DIAGNOSIS — C9000 Multiple myeloma not having achieved remission: Secondary | ICD-10-CM

## 2013-07-27 LAB — COMPREHENSIVE METABOLIC PANEL (CC13)
ALT: 25 U/L (ref 0–55)
AST: 13 U/L (ref 5–34)
Albumin: 3.4 g/dL — ABNORMAL LOW (ref 3.5–5.0)
Alkaline Phosphatase: 107 U/L (ref 40–150)
Anion Gap: 11 mEq/L (ref 3–11)
BILIRUBIN TOTAL: 1.01 mg/dL (ref 0.20–1.20)
BUN: 12.5 mg/dL (ref 7.0–26.0)
CO2: 24 mEq/L (ref 22–29)
Calcium: 9.2 mg/dL (ref 8.4–10.4)
Chloride: 105 mEq/L (ref 98–109)
Creatinine: 0.8 mg/dL (ref 0.6–1.1)
Glucose: 124 mg/dl (ref 70–140)
Potassium: 4 mEq/L (ref 3.5–5.1)
SODIUM: 140 meq/L (ref 136–145)
TOTAL PROTEIN: 7.1 g/dL (ref 6.4–8.3)

## 2013-07-27 LAB — CBC WITH DIFFERENTIAL/PLATELET
BASO%: 0.1 % (ref 0.0–2.0)
Basophils Absolute: 0 10*3/uL (ref 0.0–0.1)
EOS%: 0.1 % (ref 0.0–7.0)
Eosinophils Absolute: 0 10*3/uL (ref 0.0–0.5)
HCT: 34.2 % — ABNORMAL LOW (ref 34.8–46.6)
HGB: 11 g/dL — ABNORMAL LOW (ref 11.6–15.9)
LYMPH%: 7.6 % — ABNORMAL LOW (ref 14.0–49.7)
MCH: 32.5 pg (ref 25.1–34.0)
MCHC: 32.2 g/dL (ref 31.5–36.0)
MCV: 101.2 fL — AB (ref 79.5–101.0)
MONO#: 0.2 10*3/uL (ref 0.1–0.9)
MONO%: 2.2 % (ref 0.0–14.0)
NEUT#: 9.4 10*3/uL — ABNORMAL HIGH (ref 1.5–6.5)
NEUT%: 90 % — ABNORMAL HIGH (ref 38.4–76.8)
NRBC: 0 % (ref 0–0)
Platelets: 187 10*3/uL (ref 145–400)
RBC: 3.38 10*6/uL — AB (ref 3.70–5.45)
RDW: 16 % — AB (ref 11.2–14.5)
WBC: 10.4 10*3/uL — ABNORMAL HIGH (ref 3.9–10.3)
lymph#: 0.8 10*3/uL — ABNORMAL LOW (ref 0.9–3.3)

## 2013-07-27 MED ORDER — DEXAMETHASONE SODIUM PHOSPHATE 10 MG/ML IJ SOLN
INTRAMUSCULAR | Status: AC
  Filled 2013-07-27: qty 1

## 2013-07-27 MED ORDER — DEXTROSE 5 % IV SOLN
27.0000 mg/m2 | Freq: Once | INTRAVENOUS | Status: AC
  Administered 2013-07-27: 56 mg via INTRAVENOUS
  Filled 2013-07-27: qty 28

## 2013-07-27 MED ORDER — ONDANSETRON 8 MG/NS 50 ML IVPB
INTRAVENOUS | Status: AC
  Filled 2013-07-27: qty 8

## 2013-07-27 MED ORDER — HYDROCODONE-ACETAMINOPHEN 7.5-325 MG PO TABS: 1.0000 | ORAL_TABLET | Freq: Four times a day (QID) | ORAL | Status: DC | PRN

## 2013-07-27 MED ORDER — ONDANSETRON 8 MG/50ML IVPB (CHCC)
8.0000 mg | Freq: Once | INTRAVENOUS | Status: AC
  Administered 2013-07-27: 8 mg via INTRAVENOUS

## 2013-07-27 MED ORDER — SODIUM CHLORIDE 0.9 % IV SOLN
Freq: Once | INTRAVENOUS | Status: AC
  Administered 2013-07-27: 14:00:00 via INTRAVENOUS

## 2013-07-27 MED ORDER — SODIUM CHLORIDE 0.9 % IV SOLN
Freq: Once | INTRAVENOUS | Status: AC
  Administered 2013-07-27: 13:00:00 via INTRAVENOUS

## 2013-07-27 MED ORDER — SODIUM CHLORIDE 0.9 % IJ SOLN
10.0000 mL | INTRAMUSCULAR | Status: DC | PRN
  Administered 2013-07-27: 10 mL
  Filled 2013-07-27: qty 10

## 2013-07-27 MED ORDER — DEXAMETHASONE SODIUM PHOSPHATE 10 MG/ML IJ SOLN
10.0000 mg | Freq: Once | INTRAMUSCULAR | Status: AC
  Administered 2013-07-27: 10 mg via INTRAVENOUS

## 2013-07-27 MED ORDER — HEPARIN SOD (PORK) LOCK FLUSH 100 UNIT/ML IV SOLN
500.0000 [IU] | Freq: Once | INTRAVENOUS | Status: AC | PRN
  Administered 2013-07-27: 500 [IU]
  Filled 2013-07-27: qty 5

## 2013-07-27 NOTE — Telephone Encounter (Signed)
tx printed appts for pt.Marland KitchenMarland Kitchenpt wanted to adjust tx after 2.10.15...emailed Dr. Tyrell Antonio for instruction

## 2013-07-27 NOTE — Patient Instructions (Signed)
[facility] Discharge Instructions for Patients Receiving Chemotherapy  Today you received the following chemotherapy agent: Kyprolis   To help prevent nausea and vomiting after your treatment, we encourage you to take your nausea medication as prescribed.   If you develop nausea and vomiting that is not controlled by your nausea medication, call the clinic.   BELOW ARE SYMPTOMS THAT SHOULD BE REPORTED IMMEDIATELY:  *FEVER GREATER THAN 100.5 F  *CHILLS WITH OR WITHOUT FEVER  NAUSEA AND VOMITING THAT IS NOT CONTROLLED WITH YOUR NAUSEA MEDICATION  *UNUSUAL SHORTNESS OF BREATH  *UNUSUAL BRUISING OR BLEEDING  TENDERNESS IN MOUTH AND THROAT WITH OR WITHOUT PRESENCE OF ULCERS  *URINARY PROBLEMS  *BOWEL PROBLEMS  UNUSUAL RASH Items with * indicate a potential emergency and should be followed up as soon as possible.  Feel free to call the clinic you have any questions or concerns. The clinic phone number is [phone].

## 2013-07-27 NOTE — Progress Notes (Addendum)
[place] [phone]   [phone]  SHARED VISIT PROGRESS NOTE  [name], [address] [place] [address]  DIAGNOSIS: Recurrent multiple myeloma initially diagnosed in October 2008.   PRIOR THERAPY:  1. Status post palliative radiotherapy to the lumbar spine between L3 and L5. The patient received a total dose of 3000 cGy in 10 fractions under the care of Dr. Lisbeth Renshaw between May 05, 2007 through May 18, 2007. 2. Status post 5 cycles of systemic chemotherapy with Revlimid and low-dose Decadron with good response to this treatment. 3. Status post autologous peripheral blood stem cell transplant at [facility] on Nov 20, 2007 under the care of Dr. Valarie Merino. 4. Status post treatment for disease recurrence with Velcade, Doxil and Decadron. Last dose given Nov 09, 2009. Discontinued secondary to intolerance but the patient had a good response to treatment at that time. 5. Status post palliative radiotherapy to the T2-T6 thoracic vertebrae completed 03/21/2011 under the care of Dr. Lisbeth Renshaw. 6. Systemic chemotherapy with Velcade 1.3 mg per meter squared given on days 1, 4, 8 and 11, and Doxil at 30 mg per meter squared given on day 4 in addition to Decadron status post 4 cycles, discontinued secondary to intolerance. 7. Systemic therapy with Velcade 1.3 mg/M2 subcutaneously in addition to Decadron 40 mg by mouth on a weekly basis, status post 20 cycles. The patient had good response with this treatment but it is discontinue today secondary to worsening peripheral neuropathy. 8. Palliative radiotherapy to the skull lesion as well as the left hip area under the care of Dr. Lisbeth Renshaw.  CURRENT THERAPY:   1. Systemic chemotherapy with Carfilzomib 20 mg/M2 on days 1, 2,  8, 9, 13 and 16 every 4 weeks in addition to weekly Decadron 40 mg by mouth. First dose on 04/19/2013. Status post 3 cycles. 2. Zometa 4 mg IV given every  3 months   INTERVAL HISTORY: [name] 63 y.o. female returns to the clinic today for follow up visit accompanied by her son and daughter. The patient is feeling fine today except for the generalized aching pain.The pain is primarily on the lower back as well as secondary to the peripheral neuropathy in the lower extremities. The last few weeks she is been complaining of increased left hip pain. A bone survey performed 04/12/2013 revealed faintly visible lucent lesion in the posterior column of the left acetabulum. That lesion was also noted on the prior CT abdomen and pelvis in July 2014. There were no other new pelvic lesions. This is likely the source of the patient's left hip pain. She still on Neurontin 600 mg by mouth 4 times a day, in addition to her hydrocodone tablets. She is tolerating her treatment with Carfilzomib, and Decadron fairly well. She denied having any significant weight loss or night sweats. The patient denied having any nausea or vomiting. She has no chest pain, shortness of breath, cough or hemoptysis.   MEDICAL HISTORY: Past Medical History  Diagnosis Date  . Thyroid disease Hypothyroidism  . Hypercholesterolemia   . Compression fracture 04/08/2007    pathologic compression fracture  . Hypothyroidism   . FHx: chemotherapy     s/p 5 cycle revlimid/low dose decadron,s/p velcade,doxil,decadron,  . Hx of radiation therapy 05/05/07-05/18/07,& 03/05/11-03/21/11-    l3&l5 in 2008, t2-t6 03/2011  . GERD (gastroesophageal reflux disease)   . Insomnia     associated with steroids  . Nausea   .  Constipation     takes oxycontin,vicodin  . Hx of radiation therapy 05/05/2007 to 05/18/2007    palliative, L3-5  . 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 Hx of radiation therapy 03/05/2011 to 03/21/2011    palliative T2-T6, c-spine  . 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 PONV (postoperative nausea and vomiting)   . Metastasis to bone      ALLERGIES:  has No Known Allergies.  MEDICATIONS:  Current Outpatient Prescriptions  Medication Sig Dispense Refill  . acyclovir (ZOVIRAX) 400 MG tablet Take 1 tablet (400 mg total) by mouth daily.  30 tablet  3  . acyclovir (ZOVIRAX) 400 MG tablet TAKE 1 TABLET BY MOUTH TWICE A DAY TO TAKE AFTER ACYCLOVIR 800MG DONE  60 tablet  1  . ALPRAZolam (XANAX) 0.5 MG tablet Take 0.5 mg by mouth 3 (three) times daily as needed for anxiety.       Marland Kitchen atorvastatin (LIPITOR) 40 MG tablet Take 1 tablet (40 mg total) by mouth daily.  30 tablet  3  . cyclobenzaprine (FLEXERIL) 10 MG tablet 10 mg.      . dexamethasone (DECADRON) 4 MG tablet TAKE 10 TABLETS BY MOUTH ON A WEEKLY BASIS STARTING WITH THE FIRST CYCLE OF CHEMOTHERAPY.  80 tablet  2  . dexlansoprazole (DEXILANT) 60 MG capsule Take 1 capsule (60 mg total) by mouth daily.  30 capsule  6  . ESZOPICLONE 3 MG tablet Take 3 mg by mouth at bedtime.       . furosemide (LASIX) 40 MG tablet Take 40 mg by mouth 2 (two) times daily.      Marland Kitchen gabapentin (NEURONTIN) 600 MG tablet Take 600 mg by mouth 4 (four) times daily.      . hyaluronate sodium (RADIAPLEXRX) GEL Apply 1 application topically 2 (two) times daily. Apply to affected skin area after rad txs and bedtime      . HYDROcodone-acetaminophen (NORCO) 7.5-325 MG per tablet Take 1 tablet by mouth every 6 (six) hours as needed.  30 tablet  0  . levothyroxine (SYNTHROID, LEVOTHROID) 100 MCG tablet Take 100 mcg by mouth daily.       Marland Kitchen lidocaine-prilocaine (EMLA) cream APPLY TO PORT 1 TO 2 HRS PRIOR TO PROCEDURE  30 g  1  . loperamide (IMODIUM) 2 MG capsule Take 2 mg by mouth 4 (four) times daily as needed for diarrhea or loose stools.      Marland Kitchen morphine (MS CONTIN) 15 MG 12 hr tablet Take 1 tablet (15 mg total) by mouth 2 (two) times daily.  60 tablet  0  . Multiple Vitamin (MULITIVITAMIN WITH MINERALS) TABS Take 1 tablet by mouth daily.      . ondansetron (ZOFRAN-ODT) 4 MG disintegrating tablet Take 1 tablet by  mouth every 8 (eight) hours as needed for nausea.       . pantoprazole (PROTONIX) 40 MG tablet Take 40 mg by mouth daily.      . potassium chloride (K-DUR,KLOR-CON) 10 MEQ tablet Take 1 tablet (10 mEq total) by mouth 2 (two) times daily.  60 tablet  3  . promethazine-dextromethorphan (PROMETHAZINE-DM) 6.25-15 MG/5ML syrup TAKE 5 MLS BY MOUTH 4 (FOUR) TIMES DAILY AS NEEDED FOR COUGH.  180 mL  0  . triamcinolone cream (KENALOG) 0.1 % Apply 1 application topically 2 (two) times daily.  85.2 g  3  . metFORMIN (GLUCOPHAGE) 500 MG tablet Take 500 mg by mouth daily with breakfast.      .  PRESCRIPTION MEDICATION Inject into the vein every 7 (seven) days. Velcade infusion every Thursday.       No current facility-administered medications for this visit.   Facility-Administered Medications Ordered in Other Visits  Medication Dose Route Frequency Provider Last Rate Last Dose  . 0.9 %  sodium chloride infusion   Intravenous Once Curt Bears, MD      . ondansetron (ZOFRAN) IVPB 8 mg  8 mg Intravenous Once Curt Bears, MD      . sodium chloride 0.9 % injection 10 mL  10 mL Intracatheter PRN Curt Bears, MD      . sodium chloride 0.9 % injection 10 mL  10 mL Intracatheter PRN Curt Bears, MD   10 mL at 07/27/13 1519    SURGICAL HISTORY:  Past Surgical History  Procedure Laterality Date  . Cholecystectomy    . Knee surgery    . Knee surgery    . Video bronchoscopy  07/30/2011    Procedure: VIDEO BRONCHOSCOPY WITHOUT FLUORO;  Surgeon: Kathee Delton, MD;  Location: [facility];  Service: Cardiopulmonary;  Laterality: Bilateral;    REVIEW OF SYSTEMS:  Constitutional: positive for fatigue Eyes: negative Ears, nose, mouth, throat, and face: negative Respiratory: negative Cardiovascular: negative Gastrointestinal: negative Genitourinary:negative Integument/breast: negative Hematologic/lymphatic: negative Musculoskeletal:positive for arthralgias, back pain and Left hip  pain Neurological: positive for paresthesia and seizures Behavioral/Psych: negative Endocrine: negative Allergic/Immunologic: negative   PHYSICAL EXAMINATION: General appearance: alert, cooperative, fatigued and no distress Head: Normocephalic, without obvious abnormality, atraumatic Neck: no adenopathy, no JVD, supple, symmetrical, trachea midline and thyroid not enlarged, symmetric, no tenderness/mass/nodules Lymph nodes: Cervical, supraclavicular, and axillary nodes normal. Resp: clear to auscultation bilaterally Back: symmetric, no curvature. ROM normal. No CVA tenderness. Cardio: regular rate and rhythm, S1, S2 normal, no murmur, click, rub or gallop GI: soft, non-tender; bowel sounds normal; no masses,  no organomegaly Extremities: extremities normal, atraumatic, no cyanosis or edema and Point tenderness in the left greater trochanteric region Neurologic: Alert and oriented X 3, normal strength and tone. Normal symmetric reflexes. Normal coordination and gait  ECOG PERFORMANCE STATUS: 1 - Symptomatic but completely ambulatory  Blood pressure 138/79, pulse 102, temperature 97.9 F (36.6 C), temperature source Oral, resp. rate 18, height 5' 4"  (1.626 m), weight 204 lb 12.8 oz (92.897 kg), SpO2 100.00%.  LABORATORY DATA: Lab Results  Component Value Date   WBC 10.4* 07/27/2013   HGB 11.0* 07/27/2013   HCT 34.2* 07/27/2013   MCV 101.2* 07/27/2013   PLT 187 07/27/2013      Chemistry      Component Value Date/Time   NA 140 07/27/2013 1146   NA 138 05/04/2013 0530   K 4.0 07/27/2013 1146   K 3.9 05/04/2013 0530   CL 105 05/04/2013 0530   CL 105 12/17/2012 1015   CO2 24 07/27/2013 1146   CO2 25 05/04/2013 0530   BUN 12.5 07/27/2013 1146   BUN 9 05/04/2013 0530   CREATININE 0.8 07/27/2013 1146   CREATININE 0.68 05/04/2013 0530      Component Value Date/Time   CALCIUM 9.2 07/27/2013 1146   CALCIUM 8.7 05/04/2013 0530   ALKPHOS 107 07/27/2013 1146   ALKPHOS 78 02/24/2013 1500   AST 13  07/27/2013 1146   AST 22 02/24/2013 1500   ALT 25 07/27/2013 1146   ALT 26 02/24/2013 1500   BILITOT 1.01 07/27/2013 1146   BILITOT 0.4 02/24/2013 1500       RADIOGRAPHIC STUDIES:  ASSESSMENT AND PLAN:  This  is a very pleasant 63 years old [place] American female with recurrent multiple myeloma recently completed a course of treatment with Velcade and Decadron with improvement in her disease but this was discontinued secondary to peripheral neuropathy. The patient is tolerating her treatment with Carfilzomib and Decadron fairly well. The patient was discussed with and also seen by Dr. Julien Nordmann. She will proceed with today's 15 and 16 of cycle #4 as scheduled. cycle #4 of her treatment next week. To further evaluate her complaints of left hip pain in view of the fact that there is a multiple myeloma lesions there, we will obtain a MRI of the left hip and pelvis and also refer her to Dr. Lisbeth Renshaw for palliative radiation therapy to this area for pain control. Her hydrocodone tablets a refill today. Of note patient is on both Protonix and Dexilant, after checking with our cancer Center pharmacist, it was recommended the patient discontinue the Protonix and continue on the [place]. Patient and her family members voiced understanding.. The patient would come back for followup visit in 2 weeks with repeat myeloma panel for evaluation of her disease and response to the treatment. She was advised to call immediately if she has any concerning symptoms in the interval. The patient voices understanding of current disease status and treatment options and is in agreement with the current care plan.  All questions were answered. The patient knows to call the clinic with any problems, questions or concerns. We can certainly see the patient much sooner if necessary.  Carlton Adam [name]  ADDENDUM: Hematology/Oncology Attending: I had the face to face encounter with the patient. I recommended her care plan. This  is a very pleasant 63 years old [place] American female with recurrent multiple myeloma currently on systemic treatment with Carfilzomib and Decadron status post 3 cycles. She is currently undergoing cycle #4. The patient is tolerating her treatment fairly well with no significant adverse effects except for the persistent peripheral neuropathy that has been there before starting this treatment. She is currently on Neurontin 600 mg by mouth 4 times a day. She has been complaining of pain on the left hip area increased recently. I recommended for the patient to have MRI of the left hip for further evaluation of this area and I will refer her to Dr. Lisbeth Renshaw for evaluation and consideration of palliative radiotherapy if she has any lytic lesions in this area. She would come back for followup visit in 2 weeks for evaluation after repeating myeloma panel. She was advised to call immediately if she has any concerning symptoms in the interval.  Disclaimer: This note was dictated with voice recognition software. Similar sounding words can inadvertently be transcribed and may not be corrected upon review. Eilleen Kempf., MD 07/28/2013

## 2013-07-27 NOTE — Patient Instructions (Signed)
Continue labs and chemotherapy as scheduled We are scheduling you for an MRI of your left hip and referring you to Dr. Lisbeth Renshaw for palliative radiotherapy to this area for pain control Followup with Dr. Julien Nordmann in 2 weeks with a repeat myeloma panel to reevaluate her disease

## 2013-07-28 ENCOUNTER — Ambulatory Visit (HOSPITAL_BASED_OUTPATIENT_CLINIC_OR_DEPARTMENT_OTHER): Payer: Medicare Other

## 2013-07-28 ENCOUNTER — Other Ambulatory Visit: Payer: Self-pay | Admitting: Emergency Medicine

## 2013-07-28 VITALS — BP 132/76 | HR 94 | Temp 98.6°F | Resp 20 | Ht 64.0 in

## 2013-07-28 DIAGNOSIS — Z5112 Encounter for antineoplastic immunotherapy: Secondary | ICD-10-CM

## 2013-07-28 DIAGNOSIS — C9000 Multiple myeloma not having achieved remission: Secondary | ICD-10-CM

## 2013-07-28 DIAGNOSIS — C9002 Multiple myeloma in relapse: Secondary | ICD-10-CM

## 2013-07-28 MED ORDER — SODIUM CHLORIDE 0.9 % IV SOLN
Freq: Once | INTRAVENOUS | Status: AC
  Administered 2013-07-28: 13:00:00 via INTRAVENOUS

## 2013-07-28 MED ORDER — AZITHROMYCIN 250 MG PO TABS: ORAL_TABLET | ORAL | Status: AC

## 2013-07-28 MED ORDER — DEXAMETHASONE SODIUM PHOSPHATE 10 MG/ML IJ SOLN
INTRAMUSCULAR | Status: AC
  Filled 2013-07-28: qty 1

## 2013-07-28 MED ORDER — ONDANSETRON 8 MG/NS 50 ML IVPB
INTRAVENOUS | Status: AC
  Filled 2013-07-28: qty 8

## 2013-07-28 MED ORDER — ONDANSETRON 8 MG/50ML IVPB (CHCC)
8.0000 mg | Freq: Once | INTRAVENOUS | Status: AC
  Administered 2013-07-28: 8 mg via INTRAVENOUS

## 2013-07-28 MED ORDER — DEXAMETHASONE SODIUM PHOSPHATE 10 MG/ML IJ SOLN
10.0000 mg | Freq: Once | INTRAMUSCULAR | Status: AC
  Administered 2013-07-28: 10 mg via INTRAVENOUS

## 2013-07-28 MED ORDER — DEXTROSE 5 % IV SOLN
27.0000 mg/m2 | Freq: Once | INTRAVENOUS | Status: AC
  Administered 2013-07-28: 56 mg via INTRAVENOUS
  Filled 2013-07-28: qty 28

## 2013-07-28 MED ORDER — HEPARIN SOD (PORK) LOCK FLUSH 100 UNIT/ML IV SOLN
500.0000 [IU] | Freq: Once | INTRAVENOUS | Status: AC | PRN
  Administered 2013-07-28: 500 [IU]
  Filled 2013-07-28: qty 5

## 2013-07-28 MED ORDER — SODIUM CHLORIDE 0.9 % IV SOLN
Freq: Once | INTRAVENOUS | Status: AC
Start: 2013-07-28 — End: 2013-07-28
  Administered 2013-07-28: 13:00:00 via INTRAVENOUS

## 2013-07-28 MED ORDER — SODIUM CHLORIDE 0.9 % IJ SOLN
10.0000 mL | INTRAMUSCULAR | Status: DC | PRN
  Administered 2013-07-28: 10 mL
  Filled 2013-07-28: qty 10

## 2013-07-28 NOTE — Patient Instructions (Signed)
[facility] Discharge Instructions for Patients Receiving Chemotherapy  Today you received the following chemotherapy agents: Kyprolis  To help prevent nausea and vomiting after your treatment, we encourage you to take your nausea medication as prescribed by your physician.   If you develop nausea and vomiting that is not controlled by your nausea medication, call the clinic.   BELOW ARE SYMPTOMS THAT SHOULD BE REPORTED IMMEDIATELY:  *FEVER GREATER THAN 100.5 F  *CHILLS WITH OR WITHOUT FEVER  NAUSEA AND VOMITING THAT IS NOT CONTROLLED WITH YOUR NAUSEA MEDICATION  *UNUSUAL SHORTNESS OF BREATH  *UNUSUAL BRUISING OR BLEEDING  TENDERNESS IN MOUTH AND THROAT WITH OR WITHOUT PRESENCE OF ULCERS  *URINARY PROBLEMS  *BOWEL PROBLEMS  UNUSUAL RASH Items with * indicate a potential emergency and should be followed up as soon as possible.  Feel free to call the clinic you have any questions or concerns. The clinic phone number is [phone].

## 2013-08-02 ENCOUNTER — Other Ambulatory Visit: Payer: Self-pay | Admitting: Physician Assistant

## 2013-08-02 ENCOUNTER — Ambulatory Visit (HOSPITAL_COMMUNITY)
Admission: RE | Admit: 2013-08-02 | Discharge: 2013-08-02 | Disposition: A | Discharge status: Home / Self Care | Payer: Medicare Other | Source: Ambulatory Visit | Attending: Physician Assistant | Admitting: Physician Assistant

## 2013-08-02 DIAGNOSIS — C9000 Multiple myeloma not having achieved remission: Secondary | ICD-10-CM | POA: Insufficient documentation

## 2013-08-02 DIAGNOSIS — M25552 Pain in left hip: Secondary | ICD-10-CM

## 2013-08-02 DIAGNOSIS — M87059 Idiopathic aseptic necrosis of unspecified femur: Secondary | ICD-10-CM | POA: Insufficient documentation

## 2013-08-02 DIAGNOSIS — M949 Disorder of cartilage, unspecified: Secondary | ICD-10-CM

## 2013-08-02 DIAGNOSIS — M8448XA Pathological fracture, other site, initial encounter for fracture: Secondary | ICD-10-CM | POA: Insufficient documentation

## 2013-08-02 DIAGNOSIS — M47817 Spondylosis without myelopathy or radiculopathy, lumbosacral region: Secondary | ICD-10-CM | POA: Insufficient documentation

## 2013-08-02 DIAGNOSIS — M899 Disorder of bone, unspecified: Secondary | ICD-10-CM | POA: Insufficient documentation

## 2013-08-02 MED ORDER — GADOBENATE DIMEGLUMINE 529 MG/ML IV SOLN
20.0000 mL | Freq: Once | INTRAVENOUS | Status: AC | PRN
  Administered 2013-08-02: 19 mL via INTRAVENOUS

## 2013-08-03 ENCOUNTER — Ambulatory Visit (HOSPITAL_COMMUNITY)
Admission: RE | Admit: 2013-08-03 | Discharge: 2013-08-03 | Disposition: A | Discharge status: Home / Self Care | Payer: Medicare Other | Source: Ambulatory Visit | Attending: Emergency Medicine | Admitting: Emergency Medicine

## 2013-08-03 ENCOUNTER — Other Ambulatory Visit (HOSPITAL_BASED_OUTPATIENT_CLINIC_OR_DEPARTMENT_OTHER): Payer: Medicare Other

## 2013-08-03 ENCOUNTER — Encounter: Payer: Self-pay | Admitting: Internal Medicine

## 2013-08-03 ENCOUNTER — Other Ambulatory Visit: Payer: Self-pay | Admitting: Emergency Medicine

## 2013-08-03 ENCOUNTER — Encounter: Payer: Self-pay | Admitting: Specialist

## 2013-08-03 ENCOUNTER — Ambulatory Visit (INDEPENDENT_AMBULATORY_CARE_PROVIDER_SITE_OTHER): Payer: Medicare Other | Admitting: Internal Medicine

## 2013-08-03 VITALS — BP 114/72 | HR 80 | Temp 98.1°F | Resp 18 | Wt 207.6 lb

## 2013-08-03 DIAGNOSIS — S20219A Contusion of unspecified front wall of thorax, initial encounter: Secondary | ICD-10-CM

## 2013-08-03 DIAGNOSIS — R071 Chest pain on breathing: Secondary | ICD-10-CM | POA: Insufficient documentation

## 2013-08-03 DIAGNOSIS — R0789 Other chest pain: Secondary | ICD-10-CM

## 2013-08-03 DIAGNOSIS — C9000 Multiple myeloma not having achieved remission: Secondary | ICD-10-CM

## 2013-08-03 DIAGNOSIS — C9002 Multiple myeloma in relapse: Secondary | ICD-10-CM

## 2013-08-03 LAB — COMPREHENSIVE METABOLIC PANEL (CC13)
ALBUMIN: 3.5 g/dL (ref 3.5–5.0)
ALT: 46 U/L (ref 0–55)
AST: 22 U/L (ref 5–34)
Alkaline Phosphatase: 123 U/L (ref 40–150)
Anion Gap: 11 mEq/L (ref 3–11)
BUN: 9.8 mg/dL (ref 7.0–26.0)
CALCIUM: 9.9 mg/dL (ref 8.4–10.4)
CHLORIDE: 105 meq/L (ref 98–109)
CO2: 27 mEq/L (ref 22–29)
Creatinine: 0.9 mg/dL (ref 0.6–1.1)
GLUCOSE: 102 mg/dL (ref 70–140)
Potassium: 3.8 mEq/L (ref 3.5–5.1)
SODIUM: 143 meq/L (ref 136–145)
TOTAL PROTEIN: 7 g/dL (ref 6.4–8.3)
Total Bilirubin: 1.15 mg/dL (ref 0.20–1.20)

## 2013-08-03 LAB — CBC WITH DIFFERENTIAL/PLATELET
BASO%: 0.3 % (ref 0.0–2.0)
Basophils Absolute: 0 10*3/uL (ref 0.0–0.1)
EOS%: 0.5 % (ref 0.0–7.0)
Eosinophils Absolute: 0 10*3/uL (ref 0.0–0.5)
HEMATOCRIT: 33.1 % — AB (ref 34.8–46.6)
HEMOGLOBIN: 11.1 g/dL — AB (ref 11.6–15.9)
LYMPH#: 1.2 10*3/uL (ref 0.9–3.3)
LYMPH%: 12.3 % — ABNORMAL LOW (ref 14.0–49.7)
MCH: 34.2 pg — ABNORMAL HIGH (ref 25.1–34.0)
MCHC: 33.6 g/dL (ref 31.5–36.0)
MCV: 101.7 fL — ABNORMAL HIGH (ref 79.5–101.0)
MONO#: 0.8 10*3/uL (ref 0.1–0.9)
MONO%: 7.8 % (ref 0.0–14.0)
NEUT#: 7.6 10*3/uL — ABNORMAL HIGH (ref 1.5–6.5)
NEUT%: 79.1 % — AB (ref 38.4–76.8)
Platelets: 194 10*3/uL (ref 145–400)
RBC: 3.26 10*6/uL — ABNORMAL LOW (ref 3.70–5.45)
RDW: 17.1 % — AB (ref 11.2–14.5)
WBC: 9.6 10*3/uL (ref 3.9–10.3)

## 2013-08-03 LAB — LACTATE DEHYDROGENASE (CC13): LDH: 250 U/L — ABNORMAL HIGH (ref 125–245)

## 2013-08-03 NOTE — Progress Notes (Signed)
Met patient in lobby. Chaplain explored sources of hope and coping with patient. She talked about how much she had loved volunteering at [employer] before she got sick. It gave her a sense of meaning and purpose. Offered support. Will continue to follow. [name], [facility], PhD

## 2013-08-03 NOTE — Progress Notes (Signed)
   Subjective:    Patient ID: [name], female    DOB: 08/31/1950, 63 y.o.   MRN: [id number]   63 yo MBF with MM - failed autologous BM stem cell transplant and currently back on chemotherapy for Myeloma relapse.  Fall The accident occurred 5 to 7 days ago. Fall occurred: walking down steps to her garage. She fell from a height of 1 to 2 ft. She landed on hard floor. There was no blood loss. Point of impact: Right chest. Pain location: Right Chest. The pain is mild. The symptoms are aggravated by movement. Pertinent negatives include no abdominal pain, bowel incontinence, fever, hematuria, loss of consciousness, nausea, numbness, tingling, visual change or vomiting. Treatments tried: vicodin and oxycontin. The treatment provided mild relief.    Review of Systems  Constitutional: Negative.  Negative for fever.  HENT: Negative.   Eyes: Negative.   Respiratory: Negative.   Cardiovascular: Positive for chest pain. Negative for palpitations and leg swelling.       Rt anterolateral chest soreness  Gastrointestinal: Negative.  Negative for nausea, vomiting, abdominal pain and bowel incontinence.  Genitourinary: Negative for hematuria.  Musculoskeletal: Negative.   Skin: Negative.  Negative for color change, pallor and rash.  Neurological: Negative for tingling, loss of consciousness and numbness.  Hematological: Negative.   Psychiatric/Behavioral: Negative.        Objective:   Physical Exam  Constitutional: She is oriented to person, place, and time. She appears well-developed and well-nourished.  HENT:  Head: Normocephalic and atraumatic.  Right Ear: External ear normal.  Left Ear: External ear normal.  Nose: Nose normal.  Mouth/Throat: Oropharynx is clear and moist.  Eyes: EOM are normal. Pupils are equal, round, and reactive to light. Right eye exhibits no discharge. Left eye exhibits no discharge.  Neck: Normal range of motion. Neck supple. No JVD present. No thyromegaly  present.  Pulmonary/Chest: No stridor.  Tender Right chest wall in between the mid and anterior axillary lines  Abdominal: Soft. Bowel sounds are normal.  Musculoskeletal: Normal range of motion. She exhibits tenderness. She exhibits no edema.  Lymphadenopathy:    She has no cervical adenopathy.  Neurological: She is alert and oriented to person, place, and time. No cranial nerve deficit. Coordination normal.  Skin: No rash noted. No erythema.  Psychiatric: Her behavior is normal.   Assessment & Plan:    1. Chest Pain (786.51) suspect contusion - CXR is still pending - reassured patient that she is not felt to have serious or significant injury and that soreness should resolve over the next few weeks.

## 2013-08-04 LAB — IGG, IGA, IGM
IgA: 134 mg/dL (ref 69–380)
IgG (Immunoglobin G), Serum: 1030 mg/dL (ref 690–1700)
IgM, Serum: 46 mg/dL — ABNORMAL LOW (ref 52–322)

## 2013-08-04 LAB — KAPPA/LAMBDA LIGHT CHAINS
KAPPA LAMBDA RATIO: 0.26 (ref 0.26–1.65)
Kappa free light chain: 1.18 mg/dL (ref 0.33–1.94)
LAMBDA FREE LGHT CHN: 4.56 mg/dL — AB (ref 0.57–2.63)

## 2013-08-04 LAB — BETA 2 MICROGLOBULIN, SERUM: Beta-2 Microglobulin: 2.58 mg/L — ABNORMAL HIGH (ref 1.01–1.73)

## 2013-08-06 ENCOUNTER — Telehealth: Payer: Self-pay | Admitting: *Deleted

## 2013-08-06 NOTE — Telephone Encounter (Signed)
Patient called. Requested x-ray results and states having severe back pain.  X-rays normal and Dr Melford Aase suggest patient go to ER if her current pain meds not relieving pain.  Patient not available at home or cell numbers.  Left message to inform patient.

## 2013-08-08 ENCOUNTER — Encounter (HOSPITAL_COMMUNITY): Payer: Self-pay | Admitting: Emergency Medicine

## 2013-08-08 ENCOUNTER — Emergency Department (HOSPITAL_COMMUNITY): Payer: Medicare Other

## 2013-08-08 ENCOUNTER — Emergency Department (HOSPITAL_COMMUNITY)
Admission: EM | Admit: 2013-08-08 | Discharge: 2013-08-09 | Disposition: A | Discharge status: Home / Self Care | Payer: Medicare Other | Attending: Emergency Medicine | Admitting: Emergency Medicine

## 2013-08-08 DIAGNOSIS — Z87891 Personal history of nicotine dependence: Secondary | ICD-10-CM | POA: Insufficient documentation

## 2013-08-08 DIAGNOSIS — R296 Repeated falls: Secondary | ICD-10-CM | POA: Insufficient documentation

## 2013-08-08 DIAGNOSIS — E860 Dehydration: Secondary | ICD-10-CM | POA: Insufficient documentation

## 2013-08-08 DIAGNOSIS — Z9484 Stem cells transplant status: Secondary | ICD-10-CM | POA: Insufficient documentation

## 2013-08-08 DIAGNOSIS — R000 Tachycardia, unspecified: Secondary | ICD-10-CM | POA: Insufficient documentation

## 2013-08-08 DIAGNOSIS — K5900 Constipation, unspecified: Secondary | ICD-10-CM | POA: Insufficient documentation

## 2013-08-08 DIAGNOSIS — Z8781 Personal history of (healed) traumatic fracture: Secondary | ICD-10-CM | POA: Insufficient documentation

## 2013-08-08 DIAGNOSIS — Z85830 Personal history of malignant neoplasm of bone: Secondary | ICD-10-CM | POA: Insufficient documentation

## 2013-08-08 DIAGNOSIS — S59909A Unspecified injury of unspecified elbow, initial encounter: Secondary | ICD-10-CM | POA: Insufficient documentation

## 2013-08-08 DIAGNOSIS — C9000 Multiple myeloma not having achieved remission: Secondary | ICD-10-CM | POA: Insufficient documentation

## 2013-08-08 DIAGNOSIS — Y92009 Unspecified place in unspecified non-institutional (private) residence as the place of occurrence of the external cause: Secondary | ICD-10-CM | POA: Insufficient documentation

## 2013-08-08 DIAGNOSIS — S46909A Unspecified injury of unspecified muscle, fascia and tendon at shoulder and upper arm level, unspecified arm, initial encounter: Secondary | ICD-10-CM | POA: Insufficient documentation

## 2013-08-08 DIAGNOSIS — S6990XA Unspecified injury of unspecified wrist, hand and finger(s), initial encounter: Secondary | ICD-10-CM | POA: Insufficient documentation

## 2013-08-08 DIAGNOSIS — K219 Gastro-esophageal reflux disease without esophagitis: Secondary | ICD-10-CM | POA: Insufficient documentation

## 2013-08-08 DIAGNOSIS — G4700 Insomnia, unspecified: Secondary | ICD-10-CM | POA: Insufficient documentation

## 2013-08-08 DIAGNOSIS — Z79899 Other long term (current) drug therapy: Secondary | ICD-10-CM | POA: Insufficient documentation

## 2013-08-08 DIAGNOSIS — IMO0002 Reserved for concepts with insufficient information to code with codable children: Secondary | ICD-10-CM | POA: Insufficient documentation

## 2013-08-08 DIAGNOSIS — Z923 Personal history of irradiation: Secondary | ICD-10-CM | POA: Insufficient documentation

## 2013-08-08 DIAGNOSIS — S59919A Unspecified injury of unspecified forearm, initial encounter: Secondary | ICD-10-CM

## 2013-08-08 DIAGNOSIS — S4980XA Other specified injuries of shoulder and upper arm, unspecified arm, initial encounter: Secondary | ICD-10-CM | POA: Insufficient documentation

## 2013-08-08 DIAGNOSIS — E039 Hypothyroidism, unspecified: Secondary | ICD-10-CM | POA: Insufficient documentation

## 2013-08-08 DIAGNOSIS — Z791 Long term (current) use of non-steroidal anti-inflammatories (NSAID): Secondary | ICD-10-CM | POA: Insufficient documentation

## 2013-08-08 DIAGNOSIS — Y939 Activity, unspecified: Secondary | ICD-10-CM | POA: Insufficient documentation

## 2013-08-08 DIAGNOSIS — E7800 Pure hypercholesterolemia, unspecified: Secondary | ICD-10-CM | POA: Insufficient documentation

## 2013-08-08 DIAGNOSIS — M25519 Pain in unspecified shoulder: Secondary | ICD-10-CM

## 2013-08-08 MED ORDER — SODIUM CHLORIDE 0.9 % IV BOLUS (SEPSIS)
1000.0000 mL | Freq: Once | INTRAVENOUS | Status: AC
  Administered 2013-08-08: 1000 mL via INTRAVENOUS

## 2013-08-08 MED ORDER — NAPROXEN 500 MG PO TABS: 500.0000 mg | ORAL_TABLET | Freq: Two times a day (BID) | ORAL | Status: DC

## 2013-08-08 MED ORDER — SODIUM CHLORIDE 0.9 % IJ SOLN
INTRAMUSCULAR | Status: AC
  Filled 2013-08-08: qty 10

## 2013-08-08 NOTE — ED Provider Notes (Signed)
CSN: [id number]     Arrival date & time 08/08/13  1738 History  This chart was scribed for non-physician practitioner, Kathrin Ruddy, working with Ernestina Patches, MD by Celesta Gentile, ED Scribe. This patient was seen in room [place] and the patient's care was started at 6:30 PM.    Chief Complaint  Patient presents with  . Shoulder Pain   The history is provided by the patient and a relative. No language interpreter was used.   HPI Comments: [name] is a 63 y.o. female who presents to the Emergency Department complaining of gradually worsening right aching shoulder pain that started about 7 days ago. Pt states that she fell 4 days ago face down in her garage landing on her elbows, which she states worsened her shoulder pain. Denies LOC or Head trauma. Pt states that the pain is worsened when she raises her right arm above 90 degrees.  She states that she can't sleep on her right side due to the pain.  Pt states that her pain is an 8 out of 10.  Pt states that she is having numbness in her arms, but she states that is from the chemotherapy for her Multiple Myeloma.  She states her last chemotherapy was about 2 weeks ago.  Daughter states that pt takes multiple pain medications throughout the day including, hydrocodone, gabapentin, and oxycodone.    Past Medical History  Diagnosis Date  . Thyroid disease Hypothyroidism  . Hypercholesterolemia   . Compression fracture 04/08/2007    pathologic compression fracture  . Hypothyroidism   . FHx: chemotherapy     s/p 5 cycle revlimid/low dose decadron,s/p velcade,doxil,decadron,  . Hx of radiation therapy 05/05/07-05/18/07,& 03/05/11-03/21/11-    l3&l5 in 2008, t2-t6 03/2011  . GERD (gastroesophageal reflux disease)   . Insomnia     associated with steroids  . Nausea   . Constipation     takes oxycontin,vicodin  . Hx of radiation therapy 05/05/2007 to 05/18/2007    palliative, L3-5  . 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 Hx of  radiation therapy 03/05/2011 to 03/21/2011    palliative T2-T6, c-spine  . 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 PONV (postoperative nausea and vomiting)   . Metastasis to bone    Past Surgical History  Procedure Laterality Date  . Cholecystectomy    . Knee surgery    . Knee surgery    . Video bronchoscopy  07/30/2011    Procedure: VIDEO BRONCHOSCOPY WITHOUT FLUORO;  Surgeon: Kathee Delton, MD;  Location: [facility];  Service: Cardiopulmonary;  Laterality: Bilateral;   Family History  Problem Relation Age of Onset  . Cancer Brother     lung ca  . Cancer Maternal Uncle     colon  . Cancer Maternal Grandmother     breast ca   History  Substance Use Topics  . Smoking status: Former Smoker -- 0.25 packs/day for 6 years    Quit date: 08/27/1978  . Smokeless tobacco: Never Used  . Alcohol Use: No   OB History   Grav Para Term Preterm Abortions TAB SAB Ect Mult Living                 Review of Systems  Constitutional: Negative for fever and chills.  Respiratory: Negative for shortness of breath.   Cardiovascular: Negative for chest pain and palpitations.  Gastrointestinal: Negative for nausea, vomiting, abdominal pain and diarrhea.  All other systems reviewed and are negative.    Allergies  Review of patient's allergies indicates no known allergies.  Home Medications   Current Outpatient Rx  Name  Route  Sig  Dispense  Refill  . ALPRAZolam (XANAX) 0.5 MG tablet   Oral   Take 0.5 mg by mouth 3 (three) times daily as needed for anxiety.          Marland Kitchen atorvastatin (LIPITOR) 40 MG tablet   Oral   Take 1 tablet (40 mg total) by mouth daily.   30 tablet   3   . cyclobenzaprine (FLEXERIL) 10 MG tablet      10 mg.         . dexamethasone (DECADRON) 4 MG tablet      TAKE 10 TABLETS BY MOUTH ON A WEEKLY BASIS STARTING WITH THE FIRST CYCLE OF CHEMOTHERAPY.   80 tablet   2   . dexlansoprazole (DEXILANT) 60 MG capsule   Oral    Take 1 capsule (60 mg total) by mouth daily.   30 capsule   6   . ESZOPICLONE 3 MG tablet   Oral   Take 3 mg by mouth at bedtime.          . furosemide (LASIX) 40 MG tablet   Oral   Take 40 mg by mouth 2 (two) times daily.         Marland Kitchen gabapentin (NEURONTIN) 600 MG tablet   Oral   Take 600 mg by mouth 4 (four) times daily.         . hyaluronate sodium (RADIAPLEXRX) GEL   Topical   Apply 1 application topically 2 (two) times daily. Apply to affected skin area after rad txs and bedtime         . HYDROcodone-acetaminophen (NORCO) 7.5-325 MG per tablet   Oral   Take 1 tablet by mouth every 6 (six) hours as needed (PAIN).         Marland Kitchen levothyroxine (SYNTHROID, LEVOTHROID) 100 MCG tablet   Oral   Take 100 mcg by mouth daily.          Marland Kitchen lidocaine-prilocaine (EMLA) cream      APPLY TO PORT 1 TO 2 HRS PRIOR TO PROCEDURE   30 g   1   . loperamide (IMODIUM) 2 MG capsule   Oral   Take 2 mg by mouth 4 (four) times daily as needed for diarrhea or loose stools.         Marland Kitchen morphine (MS CONTIN) 15 MG 12 hr tablet   Oral   Take 1 tablet (15 mg total) by mouth 2 (two) times daily.   60 tablet   0   . Multiple Vitamin (MULITIVITAMIN WITH MINERALS) TABS   Oral   Take 1 tablet by mouth daily.         . ondansetron (ZOFRAN-ODT) 4 MG disintegrating tablet   Oral   Take 1 tablet by mouth every 8 (eight) hours as needed for nausea.          . potassium chloride (K-DUR,KLOR-CON) 10 MEQ tablet   Oral   Take 1 tablet (10 mEq total) by mouth 2 (two) times daily.   60 tablet   3   . promethazine-dextromethorphan (PROMETHAZINE-DM) 6.25-15 MG/5ML syrup      TAKE 5 MLS BY MOUTH 4 (FOUR) TIMES DAILY AS NEEDED FOR COUGH.   180 mL   0   . triamcinolone cream (KENALOG) 0.1 %   Topical  Apply 1 application topically 2 (two) times daily.   85.2 g   3   . naproxen (NAPROSYN) 500 MG tablet   Oral   Take 1 tablet (500 mg total) by mouth 2 (two) times daily with a meal.   30  tablet   0    Triage Vitals: BP 118/69  Pulse 113  Temp(Src) 99.6 F (37.6 C) (Oral)  Resp 18  Ht 5' 4.25" (1.632 m)  Wt 192 lb (87.091 kg)  BMI 32.70 kg/m2  SpO2 97% Physical Exam  Nursing note and vitals reviewed. Constitutional: She is oriented to person, place, and time. She appears well-developed and well-nourished. No distress.  HENT:  Head: Normocephalic and atraumatic.  Mouth/Throat: Mucous membranes are dry.  Neck: Neck supple. No JVD present. No tracheal deviation present.  Cardiovascular: Regular rhythm, normal heart sounds and intact distal pulses.  Tachycardia present.  Exam reveals no gallop and no friction rub.   No murmur heard. Pulmonary/Chest: Effort normal and breath sounds normal. No respiratory distress. She has no wheezes. She has no rales.  Musculoskeletal: Normal range of motion. She exhibits no edema.       Right shoulder: She exhibits no bony tenderness and no swelling.  Pain with resisted external rotation.  No pain with resisted internal rotation or elbow flexion.  Positive impingement signs.  Lumbar paraspinal tenderness.  No midline tenderness.    Neurological: She is alert and oriented to person, place, and time. She has normal strength. No sensory deficit.  Skin: Skin is warm and dry. No rash noted. She is not diaphoretic.  Psychiatric: She has a normal mood and affect. Her behavior is normal. Judgment and thought content normal.    ED Course  Procedures (including critical care time) DIAGNOSTIC STUDIES: Oxygen Saturation is 97% on RA, normal by my interpretation.    COORDINATION OF CARE: 6:43 PM-Will order x-ray of right shoulder.  Patient informed of current plan of treatment and evaluation and agrees with plan.    7:59 PM-Will order IV fluids to improve tachycardia.  Patient and family informed of current plan of treatment and agrees with plan.    Labs Review Labs Reviewed - No data to display Imaging Review Dg Shoulder Right  08/08/2013    CLINICAL DATA:  Status post fall with shoulder pain  EXAM: RIGHT SHOULDER - 2+ VIEW  COMPARISON:  None.  FINDINGS: There is no evidence of fracture or dislocation. Mild degenerative joint changes of right acromioclavicular joint is noted. Soft tissues are unremarkable.  IMPRESSION: No acute fracture or dislocation   Electronically Signed   By: Abelardo Diesel M.D.   On: 08/08/2013 19:02    MDM   1. Shoulder pain   2. Dehydration    Plain films show no acute fracture or dislocation.  Plan to have patient start short course of NSAIDs as directed and follow up with ortho in 2-3 days.   Patient noted to have worsening tachycardia prior to discharge. Patient afebrile and appears in NAD. Suspect tachycardia likely secondary to dehydration, but due to hx of recent chemo therapy, plan to monitor patient and give IV fluids to see if tachycardia improves.   Patient discussed with and signed over to Dr. Tawnya Crook at shift change.   Meds given in ED:  Medications  sodium chloride 0.9 % bolus 1,000 mL (0 mLs Intravenous Stopped 08/08/13 2233)  sodium chloride 0.9 % bolus 1,000 mL (0 mLs Intravenous Stopped 08/08/13 2352)    Discharge Medication List as  of 08/08/2013 11:58 PM    START taking these medications   Details  naproxen (NAPROSYN) 500 MG tablet Take 1 tablet (500 mg total) by mouth 2 (two) times daily with a meal., Starting 08/08/2013, Until Discontinued, Print       I personally performed the services described in this documentation, which was scribed in my presence. The recorded information has been reviewed and is accurate.    Sherrie George, [name] 08/09/13 [place], [place] 08/09/13 [phone]

## 2013-08-08 NOTE — Discharge Instructions (Signed)
Continue current pain medication as directed. Follow up with orthopedics in 2-3 days for further evaluation if pain should persist. Take pain medication as described with food.   Dehydration, Adult Dehydration is when you lose more fluids from the body than you take in. Vital organs like the kidneys, brain, and heart cannot function without a proper amount of fluids and salt. Any loss of fluids from the body can cause dehydration.  CAUSES   Vomiting.  Diarrhea.  Excessive sweating.  Excessive urine output.  Fever. SYMPTOMS  Mild dehydration  Thirst.  Dry lips.  Slightly dry mouth. Moderate dehydration  Very dry mouth.  Sunken eyes.  Skin does not bounce back quickly when lightly pinched and released.  Dark urine and decreased urine production.  Decreased tear production.  Headache. Severe dehydration  Very dry mouth.  Extreme thirst.  Rapid, weak pulse (more than 100 beats per minute at rest).  Cold hands and feet.  Not able to sweat in spite of heat and temperature.  Rapid breathing.  Blue lips.  Confusion and lethargy.  Difficulty being awakened.  Minimal urine production.  No tears. DIAGNOSIS  Your caregiver will diagnose dehydration based on your symptoms and your exam. Blood and urine tests will help confirm the diagnosis. The diagnostic evaluation should also identify the cause of dehydration. TREATMENT  Treatment of mild or moderate dehydration can often be done at home by increasing the amount of fluids that you drink. It is best to drink small amounts of fluid more often. Drinking too much at one time can make vomiting worse. Refer to the home care instructions below. Severe dehydration needs to be treated at the hospital where you will probably be given intravenous (IV) fluids that contain water and electrolytes. HOME CARE INSTRUCTIONS   Ask your caregiver about specific rehydration instructions.  Drink enough fluids to keep your urine  clear or pale yellow.  Drink small amounts frequently if you have nausea and vomiting.  Eat as you normally do.  Avoid:  Foods or drinks high in sugar.  Carbonated drinks.  Juice.  Extremely hot or cold fluids.  Drinks with caffeine.  Fatty, greasy foods.  Alcohol.  Tobacco.  Overeating.  Gelatin desserts.  Wash your hands well to avoid spreading bacteria and viruses.  Only take over-the-counter or prescription medicines for pain, discomfort, or fever as directed by your caregiver.  Ask your caregiver if you should continue all prescribed and over-the-counter medicines.  Keep all follow-up appointments with your caregiver. SEEK MEDICAL CARE IF:  You have abdominal pain and it increases or stays in one area (localizes).  You have a rash, stiff neck, or severe headache.  You are irritable, sleepy, or difficult to awaken.  You are weak, dizzy, or extremely thirsty. SEEK IMMEDIATE MEDICAL CARE IF:   You are unable to keep fluids down or you get worse despite treatment.  You have frequent episodes of vomiting or diarrhea.  You have blood or green matter (bile) in your vomit.  You have blood in your stool or your stool looks black and tarry.  You have not urinated in 6 to 8 hours, or you have only urinated a small amount of very dark urine.  You have a fever.  You faint. MAKE SURE YOU:   Understand these instructions.  Will watch your condition.  Will get help right away if you are not doing well or get worse. Document Released: 06/17/2005 Document Revised: 09/09/2011 Document Reviewed: 02/04/2011 [employer] Patient Information 2014 [employer] Shoulder Pain The shoulder is the joint that connects your arms to your body. The bones that form the shoulder joint include the upper arm bone (humerus), the shoulder blade (scapula), and the collarbone (clavicle). The top of the humerus is shaped like a ball and fits into a rather flat socket on the scapula  (glenoid cavity). A combination of muscles and strong, fibrous tissues that connect muscles to bones (tendons) support your shoulder joint and hold the ball in the socket. Small, fluid-filled sacs (bursae) are located in different areas of the joint. They act as cushions between the bones and the overlying soft tissues and help reduce friction between the gliding tendons and the bone as you move your arm. Your shoulder joint allows a wide range of motion in your arm. This range of motion allows you to do things like scratch your back or throw a ball. However, this range of motion also makes your shoulder more prone to pain from overuse and injury. Causes of shoulder pain can originate from both injury and overuse and usually can be grouped in the following four categories:  Redness, swelling, and pain (inflammation) of the tendon (tendinitis) or the bursae (bursitis).  Instability, such as a dislocation of the joint.  Inflammation of the joint (arthritis).  Broken bone (fracture). HOME CARE INSTRUCTIONS   Apply ice to the sore area.  Put ice in a plastic bag.  Place a towel between your skin and the bag.  Leave the ice on for 15-20 minutes, 03-04 times per day for the first 2 days.  Stop using cold packs if they do not help with the pain.  If you have a shoulder sling or immobilizer, wear it as long as your caregiver instructs. Only remove it to shower or bathe. Move your arm as little as possible, but keep your hand moving to prevent swelling.  Squeeze a soft ball or foam pad as much as possible to help prevent swelling.  Only take over-the-counter or prescription medicines for pain, discomfort, or fever as directed by your caregiver. SEEK MEDICAL CARE IF:   Your shoulder pain increases, or new pain develops in your arm, hand, or fingers.  Your hand or fingers become cold and numb.  Your pain is not relieved with medicines. SEEK IMMEDIATE MEDICAL CARE IF:   Your arm, hand, or  fingers are numb or tingling.  Your arm, hand, or fingers are significantly swollen or turn white or blue. MAKE SURE YOU:   Understand these instructions.  Will watch your condition.  Will get help right away if you are not doing well or get worse. Document Released: 03/27/2005 Document Revised: 03/11/2012 Document Reviewed: 06/01/2011 [facility] Patient Information 2014 [employer].

## 2013-08-08 NOTE — ED Notes (Signed)
PA aware of pt's elevated pulse, at bedside talking to pt now.

## 2013-08-08 NOTE — ED Notes (Signed)
Pt reports was seen on Thursday for R shoulder pain, x-rays were done, no fracture. Pt is having continued R shoulder pain. Pt also c/o losing her balance frequently, denies dizziness. Pt also says her R eye has been bothering her x 3 wks, feels like something is in her eye. Pt had eye exam in December, no issues at that time.

## 2013-08-08 NOTE — ED Provider Notes (Signed)
Pt presents w/ continued R shoulder pain.  XR unremarkable. Pain treated.  Pt now well appearing, but tachycardic. She has no complaints.  She reports last chemo about 1.5 weeks ago.  Her PO intake as been fair. No recent fever, chills, CP, n/v, d/a.  She is on abx for sinus infection for 4 days. Pt received 2L NS improvement of HR to 110.  AS she is well appearing, is tolerating PO, I believe she can be d/c home but will stress importance of close PCP f/u, home hydration.  Return precautions given for new or worsening symptoms including fever, SOB, ab pain, n/v, d/a.     1. Shoulder pain   2. Dehydration      Neta Ehlers, MD 08/09/13 [phone]

## 2013-08-09 ENCOUNTER — Emergency Department (HOSPITAL_COMMUNITY): Payer: Medicare Other

## 2013-08-09 ENCOUNTER — Ambulatory Visit (INDEPENDENT_AMBULATORY_CARE_PROVIDER_SITE_OTHER): Payer: Medicare Other | Admitting: Physician Assistant

## 2013-08-09 ENCOUNTER — Encounter (HOSPITAL_COMMUNITY): Payer: Self-pay | Admitting: Emergency Medicine

## 2013-08-09 ENCOUNTER — Encounter: Payer: Self-pay | Admitting: Physician Assistant

## 2013-08-09 ENCOUNTER — Observation Stay (HOSPITAL_COMMUNITY)
Admission: EM | Admit: 2013-08-09 | Discharge: 2013-08-10 | Disposition: A | Discharge status: Home / Self Care | Payer: Medicare Other | Attending: Internal Medicine | Admitting: Internal Medicine

## 2013-08-09 VITALS — BP 110/70 | HR 70 | Temp 97.9°F | Resp 16 | Wt 209.0 lb

## 2013-08-09 DIAGNOSIS — Z923 Personal history of irradiation: Secondary | ICD-10-CM | POA: Insufficient documentation

## 2013-08-09 DIAGNOSIS — C9002 Multiple myeloma in relapse: Secondary | ICD-10-CM | POA: Diagnosis present

## 2013-08-09 DIAGNOSIS — E7800 Pure hypercholesterolemia, unspecified: Secondary | ICD-10-CM | POA: Insufficient documentation

## 2013-08-09 DIAGNOSIS — G9349 Other encephalopathy: Secondary | ICD-10-CM | POA: Insufficient documentation

## 2013-08-09 DIAGNOSIS — M25519 Pain in unspecified shoulder: Secondary | ICD-10-CM | POA: Insufficient documentation

## 2013-08-09 DIAGNOSIS — Z8679 Personal history of other diseases of the circulatory system: Secondary | ICD-10-CM

## 2013-08-09 DIAGNOSIS — G9340 Encephalopathy, unspecified: Secondary | ICD-10-CM

## 2013-08-09 DIAGNOSIS — T398X5A Adverse effect of other nonopioid analgesics and antipyretics, not elsewhere classified, initial encounter: Secondary | ICD-10-CM | POA: Insufficient documentation

## 2013-08-09 DIAGNOSIS — I10 Essential (primary) hypertension: Secondary | ICD-10-CM | POA: Insufficient documentation

## 2013-08-09 DIAGNOSIS — W19XXXA Unspecified fall, initial encounter: Secondary | ICD-10-CM | POA: Insufficient documentation

## 2013-08-09 DIAGNOSIS — Z9484 Stem cells transplant status: Secondary | ICD-10-CM | POA: Insufficient documentation

## 2013-08-09 DIAGNOSIS — K219 Gastro-esophageal reflux disease without esophagitis: Secondary | ICD-10-CM | POA: Insufficient documentation

## 2013-08-09 DIAGNOSIS — G8929 Other chronic pain: Secondary | ICD-10-CM

## 2013-08-09 DIAGNOSIS — Z9221 Personal history of antineoplastic chemotherapy: Secondary | ICD-10-CM | POA: Insufficient documentation

## 2013-08-09 DIAGNOSIS — Z9089 Acquired absence of other organs: Secondary | ICD-10-CM | POA: Insufficient documentation

## 2013-08-09 DIAGNOSIS — E039 Hypothyroidism, unspecified: Secondary | ICD-10-CM

## 2013-08-09 DIAGNOSIS — Z79899 Other long term (current) drug therapy: Secondary | ICD-10-CM

## 2013-08-09 DIAGNOSIS — G4733 Obstructive sleep apnea (adult) (pediatric): Secondary | ICD-10-CM

## 2013-08-09 DIAGNOSIS — F411 Generalized anxiety disorder: Secondary | ICD-10-CM | POA: Insufficient documentation

## 2013-08-09 DIAGNOSIS — T3995XA Adverse effect of unspecified nonopioid analgesic, antipyretic and antirheumatic, initial encounter: Secondary | ICD-10-CM

## 2013-08-09 DIAGNOSIS — R4182 Altered mental status, unspecified: Secondary | ICD-10-CM | POA: Diagnosis present

## 2013-08-09 DIAGNOSIS — D649 Anemia, unspecified: Secondary | ICD-10-CM | POA: Diagnosis present

## 2013-08-09 DIAGNOSIS — E669 Obesity, unspecified: Secondary | ICD-10-CM | POA: Insufficient documentation

## 2013-08-09 DIAGNOSIS — G609 Hereditary and idiopathic neuropathy, unspecified: Secondary | ICD-10-CM | POA: Insufficient documentation

## 2013-08-09 DIAGNOSIS — F1910 Other psychoactive substance abuse, uncomplicated: Secondary | ICD-10-CM

## 2013-08-09 DIAGNOSIS — F19988 Other psychoactive substance use, unspecified with other psychoactive substance-induced disorder: Principal | ICD-10-CM | POA: Insufficient documentation

## 2013-08-09 DIAGNOSIS — R4789 Other speech disturbances: Secondary | ICD-10-CM | POA: Insufficient documentation

## 2013-08-09 DIAGNOSIS — F11929 Opioid use, unspecified with intoxication, unspecified: Secondary | ICD-10-CM

## 2013-08-09 DIAGNOSIS — C9000 Multiple myeloma not having achieved remission: Secondary | ICD-10-CM

## 2013-08-09 LAB — BLOOD GAS, ARTERIAL
Acid-base deficit: 0 mmol/L (ref 0.0–2.0)
Bicarbonate: 25 mEq/L — ABNORMAL HIGH (ref 20.0–24.0)
Drawn by: 35135
FIO2: 0.21 %
O2 Saturation: 94.3 %
PCO2 ART: 45.2 mmHg — AB (ref 35.0–45.0)
PO2 ART: 77.4 mmHg — AB (ref 80.0–100.0)
Patient temperature: 98.6
TCO2: 23.3 mmol/L (ref 0–100)
pH, Arterial: 7.362 (ref 7.350–7.450)

## 2013-08-09 LAB — BASIC METABOLIC PANEL
BUN: 13 mg/dL (ref 6–23)
CO2: 26 mEq/L (ref 19–32)
Calcium: 7.6 mg/dL — ABNORMAL LOW (ref 8.4–10.5)
Chloride: 100 mEq/L (ref 96–112)
Creatinine, Ser: 0.86 mg/dL (ref 0.50–1.10)
GFR calc Af Amer: 82 mL/min — ABNORMAL LOW (ref >90)
GFR, EST NON AFRICAN AMERICAN: 71 mL/min — AB (ref >90)
Glucose, Bld: 142 mg/dL — ABNORMAL HIGH (ref 70–99)
POTASSIUM: 3.8 meq/L (ref 3.7–5.3)
SODIUM: 138 meq/L (ref 137–147)

## 2013-08-09 LAB — URINALYSIS, ROUTINE W REFLEX MICROSCOPIC
Bilirubin Urine: NEGATIVE
Glucose, UA: NEGATIVE mg/dL
Hgb urine dipstick: NEGATIVE
Ketones, ur: NEGATIVE mg/dL
LEUKOCYTES UA: NEGATIVE
Nitrite: NEGATIVE
PH: 6 (ref 5.0–8.0)
Protein, ur: NEGATIVE mg/dL
SPECIFIC GRAVITY, URINE: 1.015 (ref 1.005–1.030)
Urobilinogen, UA: 1 mg/dL (ref 0.0–1.0)

## 2013-08-09 LAB — RAPID URINE DRUG SCREEN, HOSP PERFORMED
Amphetamines: NOT DETECTED
Barbiturates: NOT DETECTED
Benzodiazepines: POSITIVE — AB
COCAINE: NOT DETECTED
Opiates: POSITIVE — AB
Tetrahydrocannabinol: NOT DETECTED

## 2013-08-09 LAB — ETHANOL: Alcohol, Ethyl (B): <11 mg/dL (ref 0–11)

## 2013-08-09 LAB — CBC
HCT: 28.1 % — ABNORMAL LOW (ref 36.0–46.0)
HEMOGLOBIN: 9 g/dL — AB (ref 12.0–15.0)
MCH: 32.7 pg (ref 26.0–34.0)
MCHC: 32 g/dL (ref 30.0–36.0)
MCV: 102.2 fL — ABNORMAL HIGH (ref 78.0–100.0)
Platelets: 193 10*3/uL (ref 150–400)
RBC: 2.75 MIL/uL — ABNORMAL LOW (ref 3.87–5.11)
RDW: 15.5 % (ref 11.5–15.5)
WBC: 4.9 10*3/uL (ref 4.0–10.5)

## 2013-08-09 LAB — SALICYLATE LEVEL: Salicylate Lvl: <2 mg/dL — ABNORMAL LOW (ref 2.8–20.0)

## 2013-08-09 LAB — ACETAMINOPHEN LEVEL: Acetaminophen (Tylenol), Serum: <15 ug/mL (ref 10–30)

## 2013-08-09 MED ORDER — NALOXONE HCL 0.4 MG/ML IJ SOLN
0.4000 mg | Freq: Once | INTRAMUSCULAR | Status: AC
  Administered 2013-08-09: 0.4 mg via INTRAVENOUS
  Filled 2013-08-09: qty 1

## 2013-08-09 MED ORDER — AZITHROMYCIN 250 MG PO TABS: ORAL_TABLET | ORAL | Status: DC

## 2013-08-09 MED ORDER — NALOXONE HCL 0.4 MG/ML IJ SOLN
0.2000 mg | Freq: Once | INTRAMUSCULAR | Status: AC
  Administered 2013-08-09: 0.2 mg via INTRAVENOUS
  Filled 2013-08-09: qty 1

## 2013-08-09 NOTE — ED Notes (Signed)
Patient ok to eat per Dr Canary Brim, entered patient's room, patient sleeping. Husband at bedside requested to notify nursing staff when patient wakes up if she still would like to have something to eat.

## 2013-08-09 NOTE — ED Notes (Signed)
Pt sent from PCP. Sent here for lethargy and altered mental status. Pt has hx of doubling up on narcotic medications. Pt seen here yesterday for sob/wheezing. Complained of same to PCP. Pt drowsy and falls asleep during conversation. Pt able answer questions.

## 2013-08-09 NOTE — ED Notes (Signed)
Pt's spouse took home her pocket book at this time

## 2013-08-09 NOTE — ED Notes (Signed)
Patient ambulated by nurse and tech. Patient unsteady on her feet and states she feels less steady than usual. Patient requesting to eat.

## 2013-08-09 NOTE — H&P (Signed)
[facility] History and Physical  [name] PYP:[id number] DOB: [birth year] DOA: 08/09/2013  Referring physician: Dr. Canary Brim PCP: [name], MD   Chief Complaint:  Brought in for altered mental status.   History obtained from ED physician.  HPI:  63 y/o obese female with  past medical hx of  recurrent multiple myeloma status post palliative radiotherapy and chemotherapy, failed autologous stem cell transplant   currently receiving systemic chemotherapy and palliative radiotherapy was brought in for altered mental status since one day. She was initially seen at the PCP office for increased wheezing for posterior days. She was being treated with a course of azithromycin for bronchitis-like symptoms.  in the clinic she was found to be unsteady and falling asleep while talking and barely able to keep her eyes open and had some slurred speech as well. As her husband she also was snoring a lot. Her exam showed her to have pinpoint pupils. Given her pain patient reportedly takes extra medications per her husband.  Patient  did not have  fever, chills, nausea , vomiting, chest pain, palpitations, SOB, abdominal pain, bowel or urinary symptoms.   patient was sent to the ED.   Course in the ED  patient was sleepy and poorly arousable . She had low-grade fever of 99.60 Fahrenheit, tachycardic 200s, respiratory rate normal, stable blood pressure and maintaining sats on room. Blood work done showed chronic anemia which is unchanged. Hemoccult was positive for opiates and benzos. Blood gas done showed pH of 7.3, PO2 of 77.4 and mild hypercarbia with PCO2 of 45 . Head CT was unremarkable. Chest x-ray was negative for any infiltrates. UA was unremarkable. Alcohol level , salicylate and Tylenol level were negative . Patient given 2 subsequent doses of IV narcan and [facility] called to admit patient under observation for narcotic overdose.  During my evaluation patient was more  awake and arousable. She was able to answer questions appropriately but fell asleep during conversation.    Review of Systems:  Constitutional: Denies fever, chills, diaphoresis, appetite change and fatigue.  HEENT: Denies hearing loss, ear pain, congestion, sore throat, rhinorrhea, sneezing, mouth sores, trouble swallowing, neck pain, neck stiffness and tinnitus.   Respiratory: Denies SOB, DOE, cough, chest tightness,  and wheezing.   Cardiovascular: Denies chest pain, palpitations and leg swelling.  Gastrointestinal: Denies nausea, vomiting, abdominal pain, diarrhea, constipation, blood in stool and abdominal distention.  Genitourinary: Denies dysuria, urgency, frequency, hematuria, flank pain and difficulty urinating.  Musculoskeletal: Denies myalgias, back pain, left shoulder pain, denies joint swelling, arthralgias and gait problem.  Neurological: Denies dizziness, seizures, syncope, weakness, light-headedness, numbness and headaches.  Psychiatric/Behavioral:confusion, denies nervousness, sleep disturbance and agitation   Past Medical History  Diagnosis Date  . Thyroid disease Hypothyroidism  . Hypercholesterolemia   . Compression fracture 04/08/2007    pathologic compression fracture  . Hypothyroidism   . FHx: chemotherapy     s/p 5 cycle revlimid/low dose decadron,s/p velcade,doxil,decadron,  . Hx of radiation therapy 05/05/07-05/18/07,& 03/05/11-03/21/11-    l3&l5 in 2008, t2-t6 03/2011  . GERD (gastroesophageal reflux disease)   . Insomnia     associated with steroids  . Nausea   . Constipation     takes oxycontin,vicodin  . Hx of radiation therapy 05/05/2007 to 05/18/2007    palliative, L3-5  . 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 Hx of radiation therapy 03/05/2011 to 03/21/2011    palliative T2-T6, c-spine  . 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 PONV (postoperative nausea and vomiting)   . Metastasis to bone    Past Surgical History   Procedure Laterality Date  . Cholecystectomy    . Knee surgery    . Knee surgery    . Video bronchoscopy  07/30/2011    Procedure: VIDEO BRONCHOSCOPY WITHOUT FLUORO;  Surgeon: Kathee Delton, MD;  Location: [facility];  Service: Cardiopulmonary;  Laterality: Bilateral;   Social History:  reports that she quit smoking about 34 years ago. She has never used smokeless tobacco. She reports that she does not drink alcohol or use illicit drugs.  No Known Allergies  Family History  Problem Relation Age of Onset  . Cancer Brother     lung ca  . Cancer Maternal Uncle     colon  . Cancer Maternal Grandmother     breast ca    Prior to Admission medications   Medication Sig Start Date End Date Taking? Authorizing Provider  ALPRAZolam Duanne Moron) 0.5 MG tablet Take 0.5 mg by mouth 3 (three) times daily as needed for anxiety.  05/06/12  Yes Historical Provider, MD  atorvastatin (LIPITOR) 40 MG tablet Take 1 tablet (40 mg total) by mouth daily. 05/26/13  Yes Unk Pinto, MD  cyclobenzaprine (FLEXERIL) 10 MG tablet Take 10 mg by mouth as needed for muscle spasms.  04/09/13  Yes Historical Provider, MD  dexamethasone (DECADRON) 4 MG tablet TAKE 10 TABLETS BY MOUTH ON A WEEKLY BASIS STARTING WITH THE FIRST CYCLE OF CHEMOTHERAPY. 06/01/13  Yes Curt Bears, MD  dexlansoprazole (DEXILANT) 60 MG capsule Take 1 capsule (60 mg total) by mouth daily. 07/12/13  Yes Vicie Mutters, [name]  ESZOPICLONE 3 MG tablet Take 3 mg by mouth at bedtime.  07/08/11  Yes Historical Provider, MD  furosemide (LASIX) 40 MG tablet Take 40 mg by mouth 2 (two) times daily. 01/14/13  Yes Unk Pinto, MD  gabapentin (NEURONTIN) 600 MG tablet Take 600 mg by mouth 4 (four) times daily.   Yes Historical Provider, MD  hyaluronate sodium (RADIAPLEXRX) GEL Apply 1 application topically 2 (two) times daily. Apply to affected skin area after rad txs and bedtime 03/31/13  Yes Marye Round, MD  HYDROcodone-acetaminophen [facility]) 7.5-325  MG per tablet Take 1 tablet by mouth every 6 (six) hours as needed (PAIN). 07/27/13  Yes Adrena E Johnson, [name]  levothyroxine (SYNTHROID, LEVOTHROID) 100 MCG tablet Take 100 mcg by mouth daily.    Yes Amy Johnnye Sima, DO  lidocaine-prilocaine (EMLA) cream APPLY TO PORT 1 TO 2 HRS PRIOR TO PROCEDURE 07/12/13  Yes Curt Bears, MD  loperamide (IMODIUM) 2 MG capsule Take 2 mg by mouth 4 (four) times daily as needed for diarrhea or loose stools.   Yes Historical Provider, MD  morphine (MS CONTIN) 15 MG 12 hr tablet Take 1 tablet (15 mg total) by mouth 2 (two) times daily. 07/19/13  Yes Carlton Adam, [name]  Multiple Vitamin (MULITIVITAMIN WITH MINERALS) TABS Take 1 tablet by mouth daily.   Yes Historical Provider, MD  naproxen (NAPROSYN) 500 MG tablet Take 1 tablet (500 mg total) by mouth 2 (two) times daily with a meal. 08/08/13  Yes Dossie Arbour Lackey, [name]  ondansetron (ZOFRAN-ODT) 4 MG disintegrating tablet Take 1 tablet by mouth every 8 (eight) hours as needed for nausea.  11/30/12  Yes Historical Provider, MD  PRESCRIPTION MEDICATION carfilzomib (KYPROLIS) 56 mg in dextrose 5 % 50 mL chemo infusion 27 mg/m2  2.07 m2 (Treatment Plan Actual) Once 07/28/2013  Yes Historical Provider, MD  promethazine-dextromethorphan (PROMETHAZINE-DM) 6.25-15 MG/5ML syrup TAKE 5 MLS BY MOUTH 4 (FOUR) TIMES DAILY AS NEEDED FOR COUGH. 07/12/13  Yes Vicie Mutters, [name]  azithromycin (ZITHROMAX) 250 MG tablet Take 2 tablets (500 mg) on  Day 1,  followed by 1 tablet (250 mg) once daily on Days 2 through 5. 08/09/13 08/14/13  Vicie Mutters, [name]  potassium chloride (K-DUR,KLOR-CON) 10 MEQ tablet Take 1 tablet (10 mEq total) by mouth 2 (two) times daily. 05/31/13   Unk Pinto, MD     Physical Exam:  Filed Vitals:   08/09/13 1740 08/09/13 2043 08/09/13 2144 08/09/13 2344  BP: 110/77 114/72 123/83 111/61  Pulse: 96 83 85 79  Temp: 98.6 F (37 C)   97.4 F (36.3 C)  TempSrc: Oral   Oral  Resp: 15 11 12 9   SpO2: 95% 97%  100% 96%    Constitutional: Vital signs reviewed.  patient is an elderly obese female lying in bed arousable to commands but falls asleep during conversation HEENT:  pupils normal and reactive  bilaterally, no pallor, no icterus, moist oral mucosa, no cervical lymphadenopathy Cardiovascular: RRR, S1 normal, S2 normal, no MRG Chest: CTAB, no wheezes, rales, or rhonchi Abdominal: Soft. Non-tender, non-distended, bowel sounds are normal, Ext: warm, trace edema Neurological:  sleepy but easily arousable and oriented   Labs on Admission:  Basic Metabolic Panel:  Recent Labs Lab 08/03/13 1142 08/09/13 2040  NA 143 138  K 3.8 3.8  CL  --  100  CO2 27 26  GLUCOSE 102 142*  BUN 9.8 13  CREATININE 0.9 0.86  CALCIUM 9.9 7.6*   Liver Function Tests:  Recent Labs Lab 08/03/13 1142  AST 22  ALT 46  ALKPHOS 123  BILITOT 1.15  PROT 7.0  ALBUMIN 3.5   No results found for this basename: LIPASE, AMYLASE,  in the last 168 hours No results found for this basename: AMMONIA,  in the last 168 hours CBC:  Recent Labs Lab 08/03/13 1142 08/09/13 2040  WBC 9.6 4.9  NEUTROABS 7.6*  --   HGB 11.1* 9.0*  HCT 33.1* 28.1*  MCV 101.7* 102.2*  PLT 194 193   Cardiac Enzymes: No results found for this basename: CKTOTAL, CKMB, CKMBINDEX, TROPONINI,  in the last 168 hours BNP: No components found with this basename: POCBNP,  CBG: No results found for this basename: GLUCAP,  in the last 168 hours  Radiological Exams on Admission: Dg Chest 2 View  08/09/2013   CLINICAL DATA:  Altered mental status.  EXAM: CHEST  2 VIEW  COMPARISON:  DG RIBS UNILATERAL*R* dated 08/03/2013; DG CHEST 2 VIEW dated 08/03/2013  FINDINGS: Low lung volumes. Right-sided central venous catheter is appreciated with tip projected regions superior vena caval right atrial junction. Lungs are clear. Cardiac silhouette is enlarged. Osseous structures unremarkable.  IMPRESSION: No evidence of acute cardiopulmonary disease.    Electronically Signed   By: Margaree Mackintosh M.D.   On: 08/09/2013 21:14   Dg Shoulder Right  08/08/2013   CLINICAL DATA:  Status post fall with shoulder pain  EXAM: RIGHT SHOULDER - 2+ VIEW  COMPARISON:  None.  FINDINGS: There is no evidence of fracture or dislocation. Mild degenerative joint changes of right acromioclavicular joint is noted. Soft tissues are unremarkable.  IMPRESSION: No acute fracture or dislocation   Electronically Signed   By: Abelardo Diesel M.D.   On: 08/08/2013 19:02   Ct Head Wo Contrast  08/09/2013   CLINICAL DATA:  Altered mental status.  EXAM: CT HEAD WITHOUT CONTRAST  TECHNIQUE: Contiguous axial images were obtained from the base of the skull through the vertex without intravenous contrast.  COMPARISON:  CT scan of February 24, 2013.  FINDINGS: Lytic lesions are again noted in the skull bilaterally with the largest seen in the right posterior parietal region. These are unchanged compared to prior exam and are most consistent with the history of multiple myeloma.  No mass effect or midline shift is noted. Ventricular size is within normal limits. There is no evidence of mass lesion, hemorrhage or acute infarction.  IMPRESSION: Stable lytic lesions involving the calvarium compared to prior exam and consistent with a history of multiple myeloma. No acute intracranial abnormality is noted.   Electronically Signed   By: Sabino Dick M.D.   On: 08/09/2013 21:14      Assessment/Plan  Principal Problem: Acute encephalopathy Possibly in the setting of overuse of narcotics. Patient is on oxycodone, benzodiazepine and Neurontin for pain, anxiety and peripheral neuropathy respectively. Head CT blood work and UA unremarkable. Patient arousable and oriented now after getting 2 doses of Narcan in the ED. -Admit to MedSurg and observation. -Continue neuro checks. We'll keep her n.p.o. until mental status improves. Patient does not complaining of any pain at this time. I will hold off on her  narcotics until she is more awake. Although she is arousable and oriented to conversation she does fall asleep in between. -Continue IV hydration.  Active Problems:   Multiple myeloma Status post palliative radiotherapy to the spine and systemic chemotherapy . Failed autologous stem cell transplant. Follows with Dr. Julien Nordmann and Dr. Lisbeth Renshaw. Currently getting systemic chemotherapy every 4 weeks with weekly Decadron.  Hypothyroidism Continue Synthroid      Normocytic anemia Stable. Monitor H&H       Diet: N.p.o. until mental status improves  DVT prophylaxis: sq lovenox   Code Status: full code Family Communication: None at bedside Disposition Plan: Home once improved  Miche Loughridge, [place] [facility] Pager [phone]  Total time spent on admission :50 minutes  If 7PM-7AM, please contact night-coverage [email or web address] Password [email or web address] 08/09/2013, 11:58 PM

## 2013-08-09 NOTE — ED Notes (Signed)
Admitting MD at bedside. Requested to see patient before moving upstairs.

## 2013-08-09 NOTE — ED Notes (Signed)
Patient transported to X-ray 

## 2013-08-09 NOTE — ED Provider Notes (Signed)
CSN: [id number]     Arrival date & time 08/09/13  1653 History   First MD Initiated Contact with Patient 08/09/13 1939     Chief Complaint  Patient presents with  . Altered Mental Status     (Consider location/radiation/quality/duration/timing/severity/associated sxs/prior Treatment) HPI A LEVEL 5 CAVEAT PERTAINS DUE TO ALTERED MENTAL STATUS Pt presenting with somnolence and difficulty walking/unbalanced over the past 3 days.  She has hx of muliptle myeloma and takes multiple pain medications, she denies taking extra medications. She also has had cough recently and has tried multiple cough medications.  Currently taking a cough medication that contains codeine.  Was sent from PMD office due to concern for unintentional overdose.   Past Medical History  Diagnosis Date  . Thyroid disease Hypothyroidism  . Hypercholesterolemia   . Compression fracture 04/08/2007    pathologic compression fracture  . Hypothyroidism   . FHx: chemotherapy     s/p 5 cycle revlimid/low dose decadron,s/p velcade,doxil,decadron,  . Hx of radiation therapy 05/05/07-05/18/07,& 03/05/11-03/21/11-    l3&l5 in 2008, t2-t6 03/2011  . GERD (gastroesophageal reflux disease)   . Insomnia     associated with steroids  . Nausea   . Constipation     takes oxycontin,vicodin  . Hx of radiation therapy 05/05/2007 to 05/18/2007    palliative, L3-5  . 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 Hx of radiation therapy 03/05/2011 to 03/21/2011    palliative T2-T6, c-spine  . 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 PONV (postoperative nausea and vomiting)   . Metastasis to bone    Past Surgical History  Procedure Laterality Date  . Cholecystectomy    . Knee surgery    . Knee surgery    . Video bronchoscopy  07/30/2011    Procedure: VIDEO BRONCHOSCOPY WITHOUT FLUORO;  Surgeon: Kathee Delton, MD;  Location: [facility];  Service: Cardiopulmonary;  Laterality: Bilateral;   Family History   Problem Relation Age of Onset  . Cancer Brother     lung ca  . Cancer Maternal Uncle     colon  . Cancer Maternal Grandmother     breast ca   History  Substance Use Topics  . Smoking status: Former Smoker -- 0.25 packs/day for 6 years    Quit date: 08/27/1978  . Smokeless tobacco: Never Used  . Alcohol Use: No   OB History   Grav Para Term Preterm Abortions TAB SAB Ect Mult Living                 Review of Systems UNABLE TO OBTAIN ROS DUE TO LEVEL 5 CAVEAT    Allergies  Review of patient's allergies indicates no known allergies.  Home Medications   No current outpatient prescriptions on file. BP 110/69  Pulse 88  Temp(Src) 98.2 F (36.8 C) (Oral)  Resp 16  Ht _0  (1.626 m)  Wt 209 lb (94.802 kg)  BMI 35.86 kg/m2  SpO2 98% Vitals reviewed Physical Exam Physical Examination: General appearance - alert, somnolent appearing, and in no distress Mental status - alert, oriented to person, place, and time Eyes - pupils equal and reactive, extraocular eye movements intact, pinpoint pupils Mouth - mucous membranes moist, pharynx normal without lesions Chest - clear to auscultation, no wheezes, rales or rhonchi, symmetric air entry Heart - normal rate, regular rhythm, normal S1, S2, no murmurs, rubs, clicks or gallops Abdomen - soft, nontender, nondistended, no masses or organomegaly  Neurological - alert, oriented x 3, somnolent, awakens with sternal rub, will answer questions but then falls asleep while talking, follows commands, strength 5/5 in extremities, sensation intact, unsteady gait with walking, no nystagmus Extremities - peripheral pulses normal, no pedal edema, no clubbing or cyanosis Skin - normal coloration and turgor, no rashes  ED Course  Procedures (including critical care time)  10:57 PM pt without much improvement after narcan.  She remains unsteady in terms of gait.  Will consult triad for admission.  11:12 PM d/w triad for admission.  Labs  Review Labs Reviewed  CBC - Abnormal; Notable for the following:    RBC 2.75 (*)    Hemoglobin 9.0 (*)    HCT 28.1 (*)    MCV 102.2 (*)    All other components within normal limits  BASIC METABOLIC PANEL - Abnormal; Notable for the following:    Glucose, Bld 142 (*)    Calcium 7.6 (*)    GFR calc non Af Amer 71 (*)    GFR calc Af Amer 82 (*)    All other components within normal limits  URINE RAPID DRUG SCREEN (HOSP PERFORMED) - Abnormal; Notable for the following:    Opiates POSITIVE (*)    Benzodiazepines POSITIVE (*)    All other components within normal limits  SALICYLATE LEVEL - Abnormal; Notable for the following:    Salicylate Lvl <4.9 (*)    All other components within normal limits  BLOOD GAS, ARTERIAL - Abnormal; Notable for the following:    pCO2 arterial 45.2 (*)    pO2, Arterial 77.4 (*)    Bicarbonate 25.0 (*)    All other components within normal limits  ETHANOL  ACETAMINOPHEN LEVEL  URINALYSIS, ROUTINE W REFLEX MICROSCOPIC   Imaging Review Dg Chest 2 View  08/09/2013   CLINICAL DATA:  Altered mental status.  EXAM: CHEST  2 VIEW  COMPARISON:  DG RIBS UNILATERAL*R* dated 08/03/2013; DG CHEST 2 VIEW dated 08/03/2013  FINDINGS: Low lung volumes. Right-sided central venous catheter is appreciated with tip projected regions superior vena caval right atrial junction. Lungs are clear. Cardiac silhouette is enlarged. Osseous structures unremarkable.  IMPRESSION: No evidence of acute cardiopulmonary disease.   Electronically Signed   By: Margaree Mackintosh M.D.   On: 08/09/2013 21:14   Dg Shoulder Right  08/08/2013   CLINICAL DATA:  Status post fall with shoulder pain  EXAM: RIGHT SHOULDER - 2+ VIEW  COMPARISON:  None.  FINDINGS: There is no evidence of fracture or dislocation. Mild degenerative joint changes of right acromioclavicular joint is noted. Soft tissues are unremarkable.  IMPRESSION: No acute fracture or dislocation   Electronically Signed   By: Abelardo Diesel M.D.   On:  08/08/2013 19:02   Ct Head Wo Contrast  08/09/2013   CLINICAL DATA:  Altered mental status.  EXAM: CT HEAD WITHOUT CONTRAST  TECHNIQUE: Contiguous axial images were obtained from the base of the skull through the vertex without intravenous contrast.  COMPARISON:  CT scan of February 24, 2013.  FINDINGS: Lytic lesions are again noted in the skull bilaterally with the largest seen in the right posterior parietal region. These are unchanged compared to prior exam and are most consistent with the history of multiple myeloma.  No mass effect or midline shift is noted. Ventricular size is within normal limits. There is no evidence of mass lesion, hemorrhage or acute infarction.  IMPRESSION: Stable lytic lesions involving the calvarium compared to prior exam and consistent with a  history of multiple myeloma. No acute intracranial abnormality is noted.   Electronically Signed   By: Sabino Dick M.D.   On: 08/09/2013 21:14      MDM   Final diagnoses:  Altered mental status  Polypharmacy    Pt presenting with depressed mental status, exam appears c/w oversedation from home meds, pinpoint pupils.  No response to low dose narcan.  TIA could be on differential as well.  Will give another dose of narcan.  Pt is protecting her airway, easily awakened and will answer questions but is not at her baseline, so will admit to triad.     Threasa Beards, MD 08/10/13 Vernelle Emerald

## 2013-08-09 NOTE — ED Notes (Addendum)
RT notified of pending ABG

## 2013-08-09 NOTE — Progress Notes (Signed)
   Subjective:    Patient ID: [name], female    DOB: 30-Oct-1950, 63 y.o.   MRN: [id number]  HPI 63 yo MBF with MM - failed autologous BM stem cell transplant and currently back on chemotherapy for Myeloma relapse. She reports last chemo was about 1.5 weeks ago. Her PO intake as been fair. No recent fever, chills, CP, n/v, d/a. She is on abx for sinus infection for 4 days. She went to the ER for wheezing, right shoulder pain, and imbalance. They did an xray which was negative and gave her 2 L of NS with mild improvement to her tachycardia.   She presents here today with her husband who states she continues to wheezing and it is worse. She also states that she snores. She feels very off balance. While talking to her she is falling asleep, barely keeping her eyes open, slurred speech, and she has pin point pupils. She is on Norco, Oxycotin, flexeril, cough syrup with codeine. Husband also states that she will double up on the medications occasionally.   Husband is very concerned about her snoring and breathing. He thinks she has an infection.   Review of Systems  Constitutional: Positive for fatigue.  Eyes: Negative.   Respiratory: Positive for cough, shortness of breath and wheezing.   Cardiovascular: Negative.   Gastrointestinal: Negative.   Genitourinary: Negative.   Musculoskeletal: Positive for joint swelling (shoulder pain).  Neurological: Positive for dizziness, speech difficulty and light-headedness.  Psychiatric/Behavioral: Positive for confusion.       Objective:   Physical Exam  Constitutional: She appears well-nourished. She appears lethargic. She is sleeping.  HENT:  Head: Normocephalic and atraumatic.  Eyes: Conjunctivae are normal. Right pupil is not reactive. Left pupil is not reactive.  Pinpoint nonreactive pupils  Neck: Normal range of motion. Neck supple.  Cardiovascular: Normal pulses.  Tachycardia present.   Pulmonary/Chest: Effort normal. She has wheezes.   Abdominal: Soft. Bowel sounds are normal. There is no tenderness.  Musculoskeletal: Normal range of motion.  Neurological: She appears lethargic. No cranial nerve deficit or sensory deficit. Coordination and gait abnormal.  Skin: Skin is warm and dry.       Assessment & Plan:   Pinpoint pupils/imbalance? Accidental overdose too many pain medications medications- versus history of TIA- likely overdose- will send to the ER for evaluation  Wheezing/snoring- will go to ER, suggest getting CXR, will put on zpak with persistant husband. Get sleep study

## 2013-08-09 NOTE — ED Notes (Signed)
Patient arousable, but sleepy. States she is "doing ok" when asked how she feels. Family at bedside.

## 2013-08-09 NOTE — ED Provider Notes (Signed)
Medical screening examination/treatment/procedure(s) were conducted as a shared visit with non-physician practitioner(s) and myself.  I personally evaluated the patient during the encounter.  EKG Interpretation   None         Neta Ehlers, MD 08/09/13 1315

## 2013-08-10 ENCOUNTER — Ambulatory Visit: Payer: Medicare Other | Admitting: Physician Assistant

## 2013-08-10 ENCOUNTER — Encounter (HOSPITAL_COMMUNITY): Payer: Self-pay | Admitting: General Practice

## 2013-08-10 ENCOUNTER — Other Ambulatory Visit: Payer: Medicare Other

## 2013-08-10 ENCOUNTER — Ambulatory Visit: Payer: Medicare Other

## 2013-08-10 DIAGNOSIS — E039 Hypothyroidism, unspecified: Secondary | ICD-10-CM

## 2013-08-10 DIAGNOSIS — C9002 Multiple myeloma in relapse: Secondary | ICD-10-CM

## 2013-08-10 DIAGNOSIS — G9340 Encephalopathy, unspecified: Secondary | ICD-10-CM | POA: Diagnosis present

## 2013-08-10 DIAGNOSIS — C9000 Multiple myeloma not having achieved remission: Secondary | ICD-10-CM

## 2013-08-10 MED ORDER — ONDANSETRON HCL 4 MG PO TABS: 4.0000 mg | ORAL_TABLET | Freq: Once | ORAL | Status: DC

## 2013-08-10 MED ORDER — MORPHINE SULFATE ER 15 MG PO TBCR: 15.0000 mg | EXTENDED_RELEASE_TABLET | Freq: Two times a day (BID) | ORAL | Status: DC

## 2013-08-10 MED ORDER — ACETAMINOPHEN 325 MG PO TABS: 650.0000 mg | ORAL_TABLET | Freq: Four times a day (QID) | ORAL | Status: DC | PRN

## 2013-08-10 MED ORDER — ATORVASTATIN CALCIUM 40 MG PO TABS
40.0000 mg | ORAL_TABLET | Freq: Every day | ORAL | Status: DC
  Filled 2013-08-10: qty 1

## 2013-08-10 MED ORDER — SODIUM CHLORIDE 0.9 % IV SOLN
INTRAVENOUS | Status: DC
  Administered 2013-08-10: 01:00:00 via INTRAVENOUS

## 2013-08-10 MED ORDER — ALBUTEROL SULFATE (2.5 MG/3ML) 0.083% IN NEBU: 2.5000 mg | INHALATION_SOLUTION | Freq: Four times a day (QID) | RESPIRATORY_TRACT | Status: DC | PRN

## 2013-08-10 MED ORDER — ALPRAZOLAM 0.5 MG PO TABS: 0.5000 mg | ORAL_TABLET | Freq: Three times a day (TID) | ORAL | Status: DC | PRN

## 2013-08-10 MED ORDER — LOPERAMIDE HCL 2 MG PO CAPS: 2.0000 mg | ORAL_CAPSULE | Freq: Four times a day (QID) | ORAL | Status: DC | PRN

## 2013-08-10 MED ORDER — CYCLOBENZAPRINE HCL 10 MG PO TABS
10.0000 mg | ORAL_TABLET | Freq: Every day | ORAL | Status: DC | PRN
  Filled 2013-08-10: qty 1

## 2013-08-10 MED ORDER — HEPARIN SOD (PORK) LOCK FLUSH 100 UNIT/ML IV SOLN
500.0000 [IU] | INTRAVENOUS | Status: AC | PRN
  Administered 2013-08-10: 500 [IU]

## 2013-08-10 MED ORDER — CHLORHEXIDINE GLUCONATE 0.12 % MT SOLN
15.0000 mL | Freq: Two times a day (BID) | OROMUCOSAL | Status: DC
  Administered 2013-08-10: 15 mL via OROMUCOSAL
  Filled 2013-08-10 (×3): qty 15

## 2013-08-10 MED ORDER — ADULT MULTIVITAMIN W/MINERALS CH
1.0000 | ORAL_TABLET | Freq: Every day | ORAL | Status: DC
  Administered 2013-08-10: 1 via ORAL
  Filled 2013-08-10: qty 1

## 2013-08-10 MED ORDER — GABAPENTIN 600 MG PO TABS: 600.0000 mg | ORAL_TABLET | Freq: Four times a day (QID) | ORAL | Status: DC

## 2013-08-10 MED ORDER — GABAPENTIN 300 MG PO CAPS
600.0000 mg | ORAL_CAPSULE | Freq: Four times a day (QID) | ORAL | Status: DC
  Filled 2013-08-10 (×2): qty 2

## 2013-08-10 MED ORDER — ACETAMINOPHEN 650 MG RE SUPP: 650.0000 mg | Freq: Four times a day (QID) | RECTAL | Status: DC | PRN

## 2013-08-10 MED ORDER — BIOTENE DRY MOUTH MT LIQD: 15.0000 mL | Freq: Two times a day (BID) | OROMUCOSAL | Status: DC

## 2013-08-10 MED ORDER — RADIAPLEXRX EX GEL: 1.0000 "application " | Freq: Two times a day (BID) | CUTANEOUS | Status: DC

## 2013-08-10 MED ORDER — FUROSEMIDE 40 MG PO TABS
40.0000 mg | ORAL_TABLET | Freq: Two times a day (BID) | ORAL | Status: DC
  Administered 2013-08-10: 40 mg via ORAL
  Filled 2013-08-10 (×3): qty 1

## 2013-08-10 MED ORDER — PANTOPRAZOLE SODIUM 40 MG PO TBEC
40.0000 mg | DELAYED_RELEASE_TABLET | Freq: Every day | ORAL | Status: DC
  Administered 2013-08-10: 40 mg via ORAL
  Filled 2013-08-10 (×2): qty 1

## 2013-08-10 MED ORDER — POTASSIUM CHLORIDE CRYS ER 10 MEQ PO TBCR
10.0000 meq | EXTENDED_RELEASE_TABLET | Freq: Two times a day (BID) | ORAL | Status: DC
  Administered 2013-08-10: 10 meq via ORAL
  Filled 2013-08-10 (×2): qty 1

## 2013-08-10 MED ORDER — NAPROXEN 500 MG PO TABS
500.0000 mg | ORAL_TABLET | Freq: Two times a day (BID) | ORAL | Status: DC
  Filled 2013-08-10 (×2): qty 1

## 2013-08-10 MED ORDER — ENOXAPARIN SODIUM 40 MG/0.4ML ~~LOC~~ SOLN
40.0000 mg | SUBCUTANEOUS | Status: DC
  Administered 2013-08-10: 40 mg via SUBCUTANEOUS
  Filled 2013-08-10: qty 0.4

## 2013-08-10 MED ORDER — HYDROCODONE-ACETAMINOPHEN 7.5-325 MG PO TABS: 1.0000 | ORAL_TABLET | Freq: Four times a day (QID) | ORAL | Status: DC | PRN

## 2013-08-10 MED ORDER — LEVOTHYROXINE SODIUM 100 MCG PO TABS
100.0000 ug | ORAL_TABLET | Freq: Every day | ORAL | Status: DC
  Administered 2013-08-10: 100 ug via ORAL
  Filled 2013-08-10 (×2): qty 1

## 2013-08-10 MED ORDER — ONDANSETRON 4 MG PO TBDP
4.0000 mg | ORAL_TABLET | Freq: Three times a day (TID) | ORAL | Status: DC | PRN
  Filled 2013-08-10: qty 1

## 2013-08-10 MED ORDER — ONDANSETRON HCL 4 MG/2ML IJ SOLN
4.0000 mg | Freq: Four times a day (QID) | INTRAMUSCULAR | Status: DC | PRN
  Administered 2013-08-10: 4 mg via INTRAVENOUS
  Filled 2013-08-10: qty 2

## 2013-08-10 NOTE — Progress Notes (Signed)
Family incident with patient in room. Pt's husband and pt's  son had a disagreement about the documentation on the EMS/ED paperwork. Conversation between son and husband escalated to the point where threats were being made. I asked the patient who was upsetting her and who did she want to leave. She would not answer me. I asked the family members to stop upsetting the patient and let her rest. Security was called Pt's husband escorted out of the building with security and [employer]. Another Animal nutritionist stayed in room with patient to help ensure the patients safety. I told the husband he could return at a later time but not while the son was here. [employer]/Security  aware. No other concerns at this time.

## 2013-08-10 NOTE — Discharge Summary (Signed)
Physician Discharge Summary  [name] LEX:[id number] DOB: [birth year] DOA: 08/09/2013  PCP: [name], MD  Admit date: 08/09/2013 Discharge date: 08/10/2013  Time spent: Less than 30 minutes  Recommendations for Outpatient Follow-up:  1. Dr. Unk Pinto, PCP in 3 days. Will need followup of TSH in 4-6 weeks.  Discharge Diagnoses:  Principal Problem:   Altered mental status Active Problems:   MULTIPLE  MYELOMA   HYPOTHYROIDISM   HYPERTENSION, HX OF   Chronic pain   Normocytic anemia   Acute narcotic intoxication   Acute encephalopathy   Discharge Condition: Improved & Stable  Diet recommendation: Heart Healthy diet.  Filed Weights   08/10/13 0010  Weight: 94.802 kg (209 lb)    History of present illness:  63 year old female patient with history of recurrent multiple myeloma, status post palliative radiotherapy and chemotherapy, failed autologous stem cell transplantation, currently receiving systemic chemotherapy and palliative radiotherapy, chronic pain, presented to the ED with altered mental status of one day duration. She was recently prescribed azithromycin for bronchitis like symptoms and had not started this medication. In the clinic, she was found to be unsteady, falling asleep, barely able to keep her eyes open & slurred speech. Exam showed pinpoint pupils. In the ED, patient was drowsy and seemed to respond to 2 doses of IV Narcan.  Hospital Course:   Patient was assessed to have acute encephalopathy in the context of overuse of narcotics. Patient is on polypharmacy. Extensive workup for other etiologies such as CVA, infections, metabolic abnormalities was unremarkable. There was conflicting information from patient and family members regarding her medications. Patient, her son and granddaughter stated that she did not take extra medications either intentionally or unintentionally. She apparently has been feeling poorly for 2-3 days prior to admission  and had been sleeping more and eating less. According to nurses discussion with spouse, he apparently saw patient taking more than her usual pain medications. In any event, she was admitted to the hospital. Sedative/narcotic medications were held. Her mental status has improved and returned to baseline. She and her family members have been extensively counseled regarding avoiding taking medications more than the way they have been prescribed and dangerous effects if that is done. She was also advised not to take these medications if she is sleepy or drowsy. She is advised to followup with her PCP in the next couple of days to reassess her pain medication regimen. She verbalizes understanding. Her symptoms may also have been secondary to nonspecific acute viral illness complicated by medications. Her viral illness seems to have improved or resolved. She apparently fell approximately a week ago hitting her right shoulder and has some intermittent pain.  Multiple myeloma: Undergoing chemotherapy as outpatient. Discussed with Dr. Julien Nordmann who stated that patient was at baseline and was okay with patient being discharged home  History of hypothyroidism: Continue Synthroid  Chronic anemia: Likely secondary to multiple myeloma. Outpatient followup with hematology/oncology. No bleeding.  Consultations:  Oncology  Procedures:  None    Discharge Exam:  Complaints: Patient states that she feels much better. Feels slightly jittery. Denies pain, cough or dyspnea. As per family, mental status has returned to baseline.  Filed Vitals:   08/09/13 2344 08/10/13 0010 08/10/13 0600 08/10/13 1400  BP: 111/61 137/80 94/56 110/69  Pulse: 79 90 87 88  Temp: 97.4 F (36.3 C) 97.4 F (36.3 C) 97.9 F (36.6 C) 98.2 F (36.8 C)  TempSrc: Oral Oral Oral Oral  Resp: 9 16 16 16   Height:  5' 4"  (1.626 m)    Weight:  94.802 kg (209 lb)    SpO2: 96% 97% 99% 98%    General exam: Pleasant middle-aged female sitting  up comfortably in bed. Respiratory system: Clear. No increased work of breathing. Cardiovascular system: S1 & S2 heard, RRR. No JVD, murmurs, gallops, clicks or pedal edema. Gastrointestinal system: Abdomen is nondistended, soft and nontender. Normal bowel sounds heard. Central nervous system: Alert and oriented. No focal neurological deficits. Extremities: Symmetric 5 x 5 power.  Discharge Instructions      Discharge Orders   Future Appointments Provider Department Dept Phone   08/11/2013 9:30 AM [facility] [phone]   08/11/2013 11:45 AM [facility] [phone]   08/17/2013 11:15 AM [facility] [phone]   08/17/2013 11:45 AM [facility] [phone]   08/18/2013 11:45 AM [facility] [phone]   08/24/2013 11:15 AM [facility] [phone]   08/24/2013 11:45 AM [facility] [phone]   08/25/2013 11:45 AM [facility] [phone]   11/04/2013 9:30 AM Unk Pinto, MD [facility] [phone]   07/25/2014 2:00 PM Ardis Hughs, [name] [facility] [phone]   Future Orders Complete By Expires   Call MD for:  difficulty breathing, headache or visual disturbances  As directed    Call MD for:  extreme fatigue  As directed    Call MD for:  persistant dizziness or light-headedness  As directed    Call MD for:  persistant nausea and vomiting  As directed    Call MD for:  severe uncontrolled pain  As directed    Call MD for:  As directed    Comments:     Confusion or mental status changes.   Diet - low sodium heart healthy  As directed     Increase activity slowly  As directed        Medication List    STOP taking these medications       azithromycin 250 MG tablet  Commonly known as:  ZITHROMAX      TAKE these medications       ALPRAZolam 0.5 MG tablet  Commonly known as:  XANAX  Take 0.5 mg by mouth 3 (three) times daily as needed for anxiety.     atorvastatin 40 MG tablet  Commonly known as:  LIPITOR  Take 1 tablet (40 mg total) by mouth daily.     cyclobenzaprine 10 MG tablet  Commonly known as:  FLEXERIL  Take 10 mg by mouth daily as needed for muscle spasms.     dexamethasone 4 MG tablet  Commonly known as:  DECADRON  TAKE 10 TABLETS BY MOUTH ON A WEEKLY BASIS STARTING WITH THE FIRST CYCLE OF CHEMOTHERAPY.     dexlansoprazole 60 MG capsule  Commonly known as:  DEXILANT  Take 1 capsule (60 mg total) by mouth daily.     eszopiclone 3 MG Tabs  Generic drug:  Eszopiclone  Take 3 mg by mouth at bedtime.     furosemide 40 MG tablet  Commonly known as:  LASIX  Take 40 mg by mouth 2 (two) times daily.     gabapentin 600 MG tablet  Commonly known as:  NEURONTIN  Take 600 mg by mouth 4 (four) times daily.     hyaluronate sodium Gel  Apply 1 application topically 2 (two) times daily. Apply to affected skin area after rad txs and bedtime     HYDROcodone-acetaminophen 7.5-325 MG per tablet  Commonly known as:  NORCO  Take 1 tablet by mouth every 6 (six) hours as needed (PAIN).     levothyroxine 100 MCG tablet  Commonly known as:  SYNTHROID, LEVOTHROID  Take 100 mcg by mouth daily.     lidocaine-prilocaine cream  Commonly known as:  EMLA  APPLY TO PORT 1 TO 2 HRS PRIOR TO PROCEDURE     loperamide 2 MG capsule  Commonly known as:  IMODIUM  Take 2 mg by mouth 4 (four) times daily as needed for diarrhea or loose stools.     morphine 15 MG 12 hr tablet  Commonly known as:  MS CONTIN  Take 1 tablet (15 mg total) by mouth 2 (two) times daily.     multivitamin with minerals Tabs tablet  Take 1  tablet by mouth daily.     naproxen 500 MG tablet  Commonly known as:  NAPROSYN  Take 1 tablet (500 mg total) by mouth 2 (two) times daily with a meal.     ondansetron 4 MG disintegrating tablet  Commonly known as:  ZOFRAN-ODT  Take 1 tablet by mouth every 8 (eight) hours as needed for nausea.     potassium chloride 10 MEQ tablet  Commonly known as:  K-DUR,KLOR-CON  Take 1 tablet (10 mEq total) by mouth 2 (two) times daily.     PRESCRIPTION MEDICATION  carfilzomib (KYPROLIS) 56 mg in dextrose 5 % 50 mL chemo infusion 27 mg/m2  2.07 m2 (Treatment Plan Actual) Once 07/28/2013     promethazine-dextromethorphan 6.25-15 MG/5ML syrup  Commonly known as:  PROMETHAZINE-DM  TAKE 5 MLS BY MOUTH 4 (FOUR) TIMES DAILY AS NEEDED FOR COUGH.       Follow-up Information   Follow up with MCKEOWN,WILLIAM DAVID, MD. Schedule an appointment as soon as possible for a visit in 3 days. (Reassess pain medication regimen)    Specialty:  Internal Medicine   Contact information:   [address] [place] [address] [phone]        The results of significant diagnostics from this hospitalization (including imaging, microbiology, ancillary and laboratory) are listed below for reference.    Significant Diagnostic Studies: Dg Chest 2 View  08/09/2013   CLINICAL DATA:  Altered mental status.  EXAM: CHEST  2 VIEW  COMPARISON:  DG RIBS UNILATERAL*R* dated 08/03/2013; DG CHEST 2 VIEW dated 08/03/2013  FINDINGS: Low lung volumes. Right-sided central venous catheter is appreciated with tip projected regions superior vena caval right atrial junction. Lungs are clear. Cardiac silhouette is enlarged. Osseous structures unremarkable.  IMPRESSION: No evidence of acute cardiopulmonary disease.   Electronically Signed   By: Margaree Mackintosh M.D.   On: 08/09/2013 21:14   Ct Head Wo Contrast  08/09/2013   CLINICAL DATA:  Altered mental status.  EXAM: CT HEAD WITHOUT CONTRAST  TECHNIQUE: Contiguous axial  images were obtained from the base of the skull through the vertex without intravenous contrast.  COMPARISON:  CT scan of February 24, 2013.  FINDINGS: Lytic lesions are again noted in the skull bilaterally with the largest seen in the right posterior parietal region. These are unchanged compared to prior exam and are most consistent with the history of multiple myeloma.  No mass effect or  midline shift is noted. Ventricular size is within normal limits. There is no evidence of mass lesion, hemorrhage or acute infarction.  IMPRESSION: Stable lytic lesions involving the calvarium compared to prior exam and consistent with a history of multiple myeloma. No acute intracranial abnormality is noted.   Electronically Signed   By: Sabino Dick M.D.   On: 08/09/2013 21:14    Microbiology: No results found for this or any previous visit (from the past 240 hour(s)).   Labs: Basic Metabolic Panel:  Recent Labs Lab 08/09/13 2040  NA 138  K 3.8  CL 100  CO2 26  GLUCOSE 142*  BUN 13  CREATININE 0.86  CALCIUM 7.6*   Liver Function Tests: No results found for this basename: AST, ALT, ALKPHOS, BILITOT, PROT, ALBUMIN,  in the last 168 hours No results found for this basename: LIPASE, AMYLASE,  in the last 168 hours No results found for this basename: AMMONIA,  in the last 168 hours CBC:  Recent Labs Lab 08/09/13 2040  WBC 4.9  HGB 9.0*  HCT 28.1*  MCV 102.2*  PLT 193   Cardiac Enzymes: No results found for this basename: CKTOTAL, CKMB, CKMBINDEX, TROPONINI,  in the last 168 hours BNP: BNP (last 3 results)  Recent Labs  05/01/13 1214  PROBNP 130.0*   CBG: No results found for this basename: GLUCAP,  in the last 168 hours  Additional labs: 1. ABG on admission: PH 7.362, PCO2 45, PO2 77, bicarbonate 25 and oxygen saturation 94% 2. Serum salicylate level < 2, serum acetaminophen level <15 3. Hemoglobin A1c 1/22:5.9 and TSH: 0.255-will need to be repeated in 4-6 weeks. 4. UDS: Positive  for benzodiazepines and opiates. 5. Blood alcohol level <11   Signed:  Vernell Leep, MD, FACP, FHM. [facility] Pager [phone]  If 7PM-7AM, please contact night-coverage [email or web address] Password [email or web address] 08/10/2013, 4:45 PM

## 2013-08-10 NOTE — Progress Notes (Signed)
DIAGNOSIS: Recurrent multiple myeloma initially diagnosed in October 2008.   PRIOR THERAPY:  1. Status post palliative radiotherapy to the lumbar spine between L3 and L5. The patient received a total dose of 3000 cGy in 10 fractions under the care of Dr. Lisbeth Renshaw between May 05, 2007 through May 18, 2007. 2. Status post 5 cycles of systemic chemotherapy with Revlimid and low-dose Decadron with good response to this treatment. 3. Status post autologous peripheral blood stem cell transplant at [facility] on Nov 20, 2007 under the care of Dr. Valarie Merino. 4. Status post treatment for disease recurrence with Velcade, Doxil and Decadron. Last dose given Nov 09, 2009. Discontinued secondary to intolerance but the patient had a good response to treatment at that time. 5. Status post palliative radiotherapy to the T2-T6 thoracic vertebrae completed 03/21/2011 under the care of Dr. Lisbeth Renshaw. 6. Systemic chemotherapy with Velcade 1.3 mg per meter squared given on days 1, 4, 8 and 11, and Doxil at 30 mg per meter squared given on day 4 in addition to Decadron status post 4 cycles, discontinued secondary to intolerance. 7. Systemic therapy with Velcade 1.3 mg/M2 subcutaneously in addition to Decadron 40 mg by mouth on a weekly basis, status post 20 cycles. The patient had good response with this treatment but it is discontinue today secondary to worsening peripheral neuropathy. 8. Palliative radiotherapy to the skull lesion as well as the left hip area under the care of Dr. Lisbeth Renshaw.  CURRENT THERAPY:  1. Systemic chemotherapy with Carfilzomib 20 mg/M2 on days 1, 2, 8, 9, 13 and 16 every 4 weeks in addition to weekly Decadron 40 mg by mouth. First dose on 04/19/2013. Status post 3 cycles. 2. Zometa 4 mg IV given every 3 months  Subjective: The patient is seen and examined today. She was admitted last night was mental status change being and is steady and 4 during sleep and hard to arouse. Her husband thought  that the patient awoke extra pain medications. She received 2 doses of Narcan at the emergency department.  She is feeling much better this morning and denied having any complaints. She denied taking any extra pain medication. Her chest x-ray and urinalysis were unremarkable. She denied having any fever or chills. She has no chest pain, shortness breath, cough or hemoptysis. CT of the head was unremarkable for any disease progression.  Objective: Vital signs in last 24 hours: Temp:  [97.4 F (36.3 C)-98.6 F (37 C)] 97.9 F (36.6 C) (02/10 0600) Pulse Rate:  [70-96] 87 (02/10 0600) Resp:  [9-16] 16 (02/10 0600) BP: (94-137)/(56-83) 94/56 mmHg (02/10 0600) SpO2:  [95 %-100 %] 99 % (02/10 0600) Weight:  [209 lb (94.802 kg)] 209 lb (94.802 kg) (02/10 0010)  Intake/Output from previous day: 02/09 0701 - 02/10 0700 In: 391.3 [I.V.:391.3] Out: 300 [Urine:300] Intake/Output this shift:    General appearance: alert, cooperative and no distress Resp: clear to auscultation bilaterally Cardio: regular rate and rhythm, S1, S2 normal, no murmur, click, rub or gallop GI: soft, non-tender; bowel sounds normal; no masses,  no organomegaly Extremities: extremities normal, atraumatic, no cyanosis or edema  Lab Results:   Recent Labs  08/09/13 2040  WBC 4.9  HGB 9.0*  HCT 28.1*  PLT 193   BMET  Recent Labs  08/09/13 2040  NA 138  K 3.8  CL 100  CO2 26  GLUCOSE 142*  BUN 13  CREATININE 0.86  CALCIUM 7.6*    Studies/Results: Dg Chest 2 View  08/09/2013  CLINICAL DATA:  Altered mental status.  EXAM: CHEST  2 VIEW  COMPARISON:  DG RIBS UNILATERAL*R* dated 08/03/2013; DG CHEST 2 VIEW dated 08/03/2013  FINDINGS: Low lung volumes. Right-sided central venous catheter is appreciated with tip projected regions superior vena caval right atrial junction. Lungs are clear. Cardiac silhouette is enlarged. Osseous structures unremarkable.  IMPRESSION: No evidence of acute cardiopulmonary disease.    Electronically Signed   By: Margaree Mackintosh M.D.   On: 08/09/2013 21:14   Dg Shoulder Right  08/08/2013   CLINICAL DATA:  Status post fall with shoulder pain  EXAM: RIGHT SHOULDER - 2+ VIEW  COMPARISON:  None.  FINDINGS: There is no evidence of fracture or dislocation. Mild degenerative joint changes of right acromioclavicular joint is noted. Soft tissues are unremarkable.  IMPRESSION: No acute fracture or dislocation   Electronically Signed   By: Abelardo Diesel M.D.   On: 08/08/2013 19:02   Ct Head Wo Contrast  08/09/2013   CLINICAL DATA:  Altered mental status.  EXAM: CT HEAD WITHOUT CONTRAST  TECHNIQUE: Contiguous axial images were obtained from the base of the skull through the vertex without intravenous contrast.  COMPARISON:  CT scan of February 24, 2013.  FINDINGS: Lytic lesions are again noted in the skull bilaterally with the largest seen in the right posterior parietal region. These are unchanged compared to prior exam and are most consistent with the history of multiple myeloma.  No mass effect or midline shift is noted. Ventricular size is within normal limits. There is no evidence of mass lesion, hemorrhage or acute infarction.  IMPRESSION: Stable lytic lesions involving the calvarium compared to prior exam and consistent with a history of multiple myeloma. No acute intracranial abnormality is noted.   Electronically Signed   By: Sabino Dick M.D.   On: 08/09/2013 21:14    Medications: I have reviewed the patient's current medications.  Assessment/Plan: 1) relapsed multiple myeloma: Currently on treatment with Carfilzomib and dexamethasone. She is tolerating her treatment fairly well with no significant adverse effects and the recent myeloma panel showed improvement in her condition. I will resume her treatment after discharge from the hospital. 2) mental status change: Questionable for narcotic overdose. The patient is feeling much better today. I strongly recommended for her to take her pain  medication as prescribed. She may need teaching about the best way to take her medication before discharge from the hospital. Thank you for taking good care of Ms. Sevin, I will continue to follow the patient with you and assist in her management on as-needed basis.   LOS: 1 day    Mycah Mcdougall K. 08/10/2013

## 2013-08-10 NOTE — Progress Notes (Signed)
UR completed 

## 2013-08-10 NOTE — Evaluation (Addendum)
Physical Therapy Evaluation Patient Details Name: [name] MRN: [id number] DOB: [birth year] Today's Date: 08/10/2013 Time: 3428-7681 PT Time Calculation (min): 28 min  PT Assessment / Plan / Recommendation History of Present Illness  63 yo female admitted with AMS, acute encephalopathy. Hx of multiple myeloma-currently undergoing chemo/palliative radiotherapy, peripheral neuropathy.   Clinical Impression  On eval, pt required Min assist for mobility-able to ambulate ~300 feet with assistive device. Demonstrates general weakness and impaired gait and balance. Recommend HHPT for gait and balance training and supervision for OOB/mobility, especially initially (for stairs/showers). Discussed need for pt to use walker for ambulation at all times however pt states she doesn't like to use walker and it bothers her hands. Recommended pt/family/HH modify walker handles for comfort. Explained to pt her risk for falls due to intermittent knee buckling/"giving way" and overall unsteadiness-pt acknowledges this but continues to state she doesn't like to use walker.     PT Assessment  Patient needs continued PT services    Follow Up Recommendations  Home health PT;Supervision for mobility/OOB    Does the patient have the potential to tolerate intense rehabilitation      Barriers to Discharge        Equipment Recommendations  None recommended by PT (pt has all DME)    Recommendations for Other Services     Frequency Min 3X/week    Precautions / Restrictions Precautions Precautions: Fall Restrictions Weight Bearing Restrictions: No   Pertinent Vitals/Pain Headache-unrated-pt declined pain meds for this.      Mobility  Bed Mobility Overal bed mobility: Independent Transfers Overall transfer level: Needs assistance Transfers: Sit to/from Stand Sit to Stand: Supervision Ambulation/Gait Ambulation/Gait assistance: Min assist Ambulation Distance (Feet): 300 Feet Assistive device:  Rolling walker (2 wheeled) Gait Pattern/deviations: Step-through pattern;Decreased stride length General Gait Details: Initially walked without assistive device ~50 feet-pt unsteady with intermittent knee buckling noted. Had pt walk with walker for remainder of distance-still demonstrated same deficits but slightly steadier. Fatigues fairly easily-3 standing rest breaks needed .     Exercises     PT Diagnosis: Difficulty walking;Generalized weakness;Abnormality of gait  PT Problem List: Decreased strength;Decreased activity tolerance;Decreased balance;Decreased mobility;Decreased knowledge of use of DME;Pain PT Treatment Interventions: DME instruction;Gait training;Stair training;Functional mobility training;Therapeutic activities;Therapeutic exercise;Patient/family education     PT Goals(Current goals can be found in the care plan section) Acute Rehab PT Goals Patient Stated Goal: home possibly today PT Goal Formulation: With patient Time For Goal Achievement: 08/24/13 Potential to Achieve Goals: Good  Visit Information  Last PT Received On: 08/10/13 Assistance Needed: +1 History of Present Illness: 63 yo female admitted with AMS, acute encephalopathy. Hx of multiple myeloma-currently undergoing chemo/palliative radiotherapy, peripheral neuropathy.        Prior Section expects to be discharged to:: Private residence Living Arrangements: Spouse/significant other Type of Home: House Home Access: Stairs to enter [employer] of Steps: 4 Entrance Stairs-Rails: Right Home Layout: Two level;Bed/bath upstairs Alternate Level Stairs-Rails: Right Home Equipment: Walker - 2 wheels;Walker - 4 wheels;Cane - single point Prior Function Level of Independence: Independent Communication Communication: No difficulties    Cognition  Cognition Arousal/Alertness: Awake/alert Behavior During Therapy: WFL for tasks assessed/performed Overall Cognitive  Status: Within Functional Limits for tasks assessed    Extremity/Trunk Assessment Upper Extremity Assessment Upper Extremity Assessment: Overall WFL for tasks assessed;RUE deficits/detail;LUE deficits/detail RUE Sensation: history of peripheral neuropathy LUE Sensation: history of peripheral neuropathy Lower Extremity Assessment Lower Extremity Assessment: RLE deficits/detail;LLE deficits/detail;Generalized weakness  RLE Deficits / Details: strength at least 4/5 throughout RLE Sensation: history of peripheral neuropathy LLE Deficits / Details: strength at least 4/5 throughout LLE Sensation: history of peripheral neuropathy Cervical / Trunk Assessment Cervical / Trunk Assessment: Normal   Balance Balance Overall balance assessment: Needs assistance Sitting-balance support: Feet supported Sitting balance-Leahy Scale: Normal Standing balance support: During functional activity;Bilateral upper extremity supported Standing balance-Leahy Scale: Fair  End of Session PT - End of Session Equipment Utilized During Treatment: Gait belt Activity Tolerance: Patient limited by fatigue Patient left: in bed;with call bell/phone within reach;with family/visitor present  GP Functional Assessment Tool Used: clinical observation Functional Limitation: Mobility: Walking and moving around Mobility: Walking and Moving Around Current Status (T6226): At least 20 percent but less than 40 percent impaired, limited or restricted Mobility: Walking and Moving Around Goal Status [phone]): At least 1 percent but less than 20 percent impaired, limited or restricted Mobility: Walking and Moving Around Discharge Status [phone]): At least 20 percent but less than 40 percent impaired, limited or restricted   Weston Anna, MPT Pager: [phone]

## 2013-08-11 ENCOUNTER — Ambulatory Visit: Payer: Medicare Other

## 2013-08-11 ENCOUNTER — Ambulatory Visit: Admission: RE | Admit: 2013-08-11 | Payer: Medicare Other | Source: Ambulatory Visit

## 2013-08-11 ENCOUNTER — Ambulatory Visit: Admission: RE | Admit: 2013-08-11 | Payer: Medicare Other | Source: Ambulatory Visit | Admitting: Radiation Oncology

## 2013-08-11 ENCOUNTER — Telehealth: Payer: Self-pay | Admitting: *Deleted

## 2013-08-11 NOTE — Telephone Encounter (Signed)
No show

## 2013-08-13 ENCOUNTER — Telehealth: Payer: Self-pay | Admitting: Medical Oncology

## 2013-08-13 ENCOUNTER — Telehealth: Payer: Self-pay | Admitting: *Deleted

## 2013-08-13 NOTE — Telephone Encounter (Signed)
Pe desk RN i have moved lab on 2/17 and treatment on 2/18 to 2/16

## 2013-08-13 NOTE — Telephone Encounter (Signed)
Needs appt change for monday and Tuesday per pof. VM to [place].appts changed and confirmed with [place].

## 2013-08-16 ENCOUNTER — Other Ambulatory Visit (HOSPITAL_BASED_OUTPATIENT_CLINIC_OR_DEPARTMENT_OTHER): Payer: Medicare Other

## 2013-08-16 ENCOUNTER — Other Ambulatory Visit: Payer: Self-pay | Admitting: Medical Oncology

## 2013-08-16 ENCOUNTER — Ambulatory Visit (HOSPITAL_BASED_OUTPATIENT_CLINIC_OR_DEPARTMENT_OTHER): Payer: Medicare Other

## 2013-08-16 ENCOUNTER — Other Ambulatory Visit: Payer: Self-pay | Admitting: Internal Medicine

## 2013-08-16 VITALS — BP 130/78 | HR 98 | Temp 98.9°F | Resp 18

## 2013-08-16 DIAGNOSIS — C9000 Multiple myeloma not having achieved remission: Secondary | ICD-10-CM

## 2013-08-16 DIAGNOSIS — C9002 Multiple myeloma in relapse: Secondary | ICD-10-CM

## 2013-08-16 DIAGNOSIS — Z5112 Encounter for antineoplastic immunotherapy: Secondary | ICD-10-CM

## 2013-08-16 LAB — CBC WITH DIFFERENTIAL/PLATELET
BASO%: 0.1 % (ref 0.0–2.0)
BASOS ABS: 0 10*3/uL (ref 0.0–0.1)
EOS%: 0 % (ref 0.0–7.0)
Eosinophils Absolute: 0 10*3/uL (ref 0.0–0.5)
HEMATOCRIT: 32.5 % — AB (ref 34.8–46.6)
HGB: 10.6 g/dL — ABNORMAL LOW (ref 11.6–15.9)
LYMPH%: 8.5 % — AB (ref 14.0–49.7)
MCH: 32.7 pg (ref 25.1–34.0)
MCHC: 32.7 g/dL (ref 31.5–36.0)
MCV: 100.3 fL (ref 79.5–101.0)
MONO#: 0.1 10*3/uL (ref 0.1–0.9)
MONO%: 1.5 % (ref 0.0–14.0)
NEUT%: 89.9 % — AB (ref 38.4–76.8)
NEUTROS ABS: 7.5 10*3/uL — AB (ref 1.5–6.5)
PLATELETS: 271 10*3/uL (ref 145–400)
RBC: 3.24 10*6/uL — ABNORMAL LOW (ref 3.70–5.45)
RDW: 16.4 % — ABNORMAL HIGH (ref 11.2–14.5)
WBC: 8.3 10*3/uL (ref 3.9–10.3)
lymph#: 0.7 10*3/uL — ABNORMAL LOW (ref 0.9–3.3)

## 2013-08-16 LAB — COMPREHENSIVE METABOLIC PANEL (CC13)
ALT: 18 U/L (ref 0–55)
AST: 16 U/L (ref 5–34)
Albumin: 3.6 g/dL (ref 3.5–5.0)
Alkaline Phosphatase: 94 U/L (ref 40–150)
Anion Gap: 11 mEq/L (ref 3–11)
BILIRUBIN TOTAL: 0.86 mg/dL (ref 0.20–1.20)
BUN: 14.1 mg/dL (ref 7.0–26.0)
CO2: 22 meq/L (ref 22–29)
Calcium: 10.7 mg/dL — ABNORMAL HIGH (ref 8.4–10.4)
Chloride: 105 mEq/L (ref 98–109)
Creatinine: 1 mg/dL (ref 0.6–1.1)
GLUCOSE: 161 mg/dL — AB (ref 70–140)
Potassium: 3.8 mEq/L (ref 3.5–5.1)
Sodium: 138 mEq/L (ref 136–145)
Total Protein: 7.8 g/dL (ref 6.4–8.3)

## 2013-08-16 MED ORDER — SODIUM CHLORIDE 0.9 % IV SOLN
Freq: Once | INTRAVENOUS | Status: AC
  Administered 2013-08-16: 17:00:00 via INTRAVENOUS

## 2013-08-16 MED ORDER — SODIUM CHLORIDE 0.9 % IV SOLN
Freq: Once | INTRAVENOUS | Status: AC
  Administered 2013-08-16: 16:00:00 via INTRAVENOUS

## 2013-08-16 MED ORDER — MORPHINE SULFATE ER 15 MG PO TBCR: 15.0000 mg | EXTENDED_RELEASE_TABLET | Freq: Two times a day (BID) | ORAL | Status: DC

## 2013-08-16 MED ORDER — DEXTROSE 5 % IV SOLN
27.0000 mg/m2 | Freq: Once | INTRAVENOUS | Status: AC
  Administered 2013-08-16: 56 mg via INTRAVENOUS
  Filled 2013-08-16: qty 28

## 2013-08-16 MED ORDER — SODIUM CHLORIDE 0.9 % IJ SOLN
10.0000 mL | INTRAMUSCULAR | Status: DC | PRN
  Administered 2013-08-16: 10 mL
  Filled 2013-08-16: qty 10

## 2013-08-16 MED ORDER — DEXAMETHASONE SODIUM PHOSPHATE 10 MG/ML IJ SOLN
INTRAMUSCULAR | Status: AC
  Filled 2013-08-16: qty 1

## 2013-08-16 MED ORDER — HEPARIN SOD (PORK) LOCK FLUSH 100 UNIT/ML IV SOLN
500.0000 [IU] | Freq: Once | INTRAVENOUS | Status: AC | PRN
  Administered 2013-08-16: 500 [IU]
  Filled 2013-08-16: qty 5

## 2013-08-16 MED ORDER — ONDANSETRON 8 MG/50ML IVPB (CHCC)
8.0000 mg | Freq: Once | INTRAVENOUS | Status: AC
  Administered 2013-08-16: 8 mg via INTRAVENOUS

## 2013-08-16 MED ORDER — DEXAMETHASONE SODIUM PHOSPHATE 10 MG/ML IJ SOLN
10.0000 mg | Freq: Once | INTRAMUSCULAR | Status: AC
  Administered 2013-08-16: 10 mg via INTRAVENOUS

## 2013-08-16 NOTE — Telephone Encounter (Signed)
Morphine rx locked in injection room for pickup.

## 2013-08-16 NOTE — Patient Instructions (Signed)
[facility] Discharge Instructions for Patients Receiving Chemotherapy  Today you received the following chemotherapy agents :  Kyprolis.  To help prevent nausea and vomiting after your treatment, we encourage you to take your nausea medication as instructed by your physician.   If you develop nausea and vomiting that is not controlled by your nausea medication, call the clinic.   BELOW ARE SYMPTOMS THAT SHOULD BE REPORTED IMMEDIATELY:  *FEVER GREATER THAN 100.5 F  *CHILLS WITH OR WITHOUT FEVER  NAUSEA AND VOMITING THAT IS NOT CONTROLLED WITH YOUR NAUSEA MEDICATION  *UNUSUAL SHORTNESS OF BREATH  *UNUSUAL BRUISING OR BLEEDING  TENDERNESS IN MOUTH AND THROAT WITH OR WITHOUT PRESENCE OF ULCERS  *URINARY PROBLEMS  *BOWEL PROBLEMS  UNUSUAL RASH Items with * indicate a potential emergency and should be followed up as soon as possible.  Feel free to call the clinic you have any questions or concerns. The clinic phone number is [phone].

## 2013-08-17 ENCOUNTER — Encounter: Payer: Self-pay | Admitting: Specialist

## 2013-08-17 ENCOUNTER — Other Ambulatory Visit: Payer: Self-pay | Admitting: Internal Medicine

## 2013-08-17 ENCOUNTER — Other Ambulatory Visit: Payer: Medicare Other

## 2013-08-17 ENCOUNTER — Ambulatory Visit (HOSPITAL_BASED_OUTPATIENT_CLINIC_OR_DEPARTMENT_OTHER): Payer: Medicare Other

## 2013-08-17 VITALS — BP 126/66 | HR 106 | Temp 98.5°F | Resp 20

## 2013-08-17 DIAGNOSIS — C9000 Multiple myeloma not having achieved remission: Secondary | ICD-10-CM

## 2013-08-17 DIAGNOSIS — Z5112 Encounter for antineoplastic immunotherapy: Secondary | ICD-10-CM

## 2013-08-17 DIAGNOSIS — C9002 Multiple myeloma in relapse: Secondary | ICD-10-CM

## 2013-08-17 MED ORDER — DEXAMETHASONE SODIUM PHOSPHATE 10 MG/ML IJ SOLN
10.0000 mg | Freq: Once | INTRAMUSCULAR | Status: AC
  Administered 2013-08-17: 10 mg via INTRAVENOUS

## 2013-08-17 MED ORDER — DEXTROSE 5 % IV SOLN
27.0000 mg/m2 | Freq: Once | INTRAVENOUS | Status: AC
  Administered 2013-08-17: 56 mg via INTRAVENOUS
  Filled 2013-08-17: qty 28

## 2013-08-17 MED ORDER — SODIUM CHLORIDE 0.9 % IV SOLN
Freq: Once | INTRAVENOUS | Status: AC
  Administered 2013-08-17: 12:00:00 via INTRAVENOUS

## 2013-08-17 MED ORDER — SODIUM CHLORIDE 0.9 % IJ SOLN
10.0000 mL | INTRAMUSCULAR | Status: DC | PRN
  Administered 2013-08-17: 10 mL via INTRAVENOUS
  Filled 2013-08-17: qty 10

## 2013-08-17 MED ORDER — HEPARIN SOD (PORK) LOCK FLUSH 100 UNIT/ML IV SOLN
500.0000 [IU] | Freq: Once | INTRAVENOUS | Status: AC
  Administered 2013-08-17: 500 [IU] via INTRAVENOUS
  Filled 2013-08-17: qty 5

## 2013-08-17 MED ORDER — ONDANSETRON 8 MG/NS 50 ML IVPB
INTRAVENOUS | Status: AC
  Filled 2013-08-17: qty 8

## 2013-08-17 MED ORDER — SODIUM CHLORIDE 0.9 % IV SOLN
Freq: Once | INTRAVENOUS | Status: AC
  Administered 2013-08-17: 13:00:00 via INTRAVENOUS

## 2013-08-17 MED ORDER — ONDANSETRON 8 MG/50ML IVPB (CHCC)
8.0000 mg | Freq: Once | INTRAVENOUS | Status: AC
  Administered 2013-08-17: 8 mg via INTRAVENOUS

## 2013-08-17 MED ORDER — DEXAMETHASONE SODIUM PHOSPHATE 10 MG/ML IJ SOLN
INTRAMUSCULAR | Status: AC
  Filled 2013-08-17: qty 1

## 2013-08-17 NOTE — Patient Instructions (Signed)
[facility] Discharge Instructions for Patients Receiving Chemotherapy  Today you received the following chemotherapy agents: Kyprolis  To help prevent nausea and vomiting after your treatment, we encourage you to take your nausea medication as prescribed by your physician.   If you develop nausea and vomiting that is not controlled by your nausea medication, call the clinic.   BELOW ARE SYMPTOMS THAT SHOULD BE REPORTED IMMEDIATELY:  *FEVER GREATER THAN 100.5 F  *CHILLS WITH OR WITHOUT FEVER  NAUSEA AND VOMITING THAT IS NOT CONTROLLED WITH YOUR NAUSEA MEDICATION  *UNUSUAL SHORTNESS OF BREATH  *UNUSUAL BRUISING OR BLEEDING  TENDERNESS IN MOUTH AND THROAT WITH OR WITHOUT PRESENCE OF ULCERS  *URINARY PROBLEMS  *BOWEL PROBLEMS  UNUSUAL RASH Items with * indicate a potential emergency and should be followed up as soon as possible.  Feel free to call the clinic you have any questions or concerns. The clinic phone number is [phone].

## 2013-08-17 NOTE — Progress Notes (Signed)
Met patient at art table and in infusion room. Patient is enthusiastic about the art. She said, "It helps you take your mind off the treatment." Her daughter was also present. Facilitated mother's and daughter's narrative discussion of importance of family and connectedness to people and faith.

## 2013-08-18 ENCOUNTER — Ambulatory Visit: Payer: Medicare Other

## 2013-08-19 ENCOUNTER — Encounter: Payer: Self-pay | Admitting: *Deleted

## 2013-08-19 ENCOUNTER — Encounter: Payer: Self-pay | Admitting: Radiation Oncology

## 2013-08-19 ENCOUNTER — Telehealth: Payer: Self-pay | Admitting: *Deleted

## 2013-08-19 ENCOUNTER — Ambulatory Visit
Admission: RE | Admit: 2013-08-19 | Discharge: 2013-08-19 | Disposition: A | Discharge status: Home / Self Care | Payer: Medicare Other | Source: Ambulatory Visit | Attending: Radiation Oncology | Admitting: Radiation Oncology

## 2013-08-19 VITALS — BP 153/80 | HR 112 | Temp 98.6°F | Resp 24 | Ht 64.0 in | Wt 201.3 lb

## 2013-08-19 DIAGNOSIS — C9000 Multiple myeloma not having achieved remission: Secondary | ICD-10-CM | POA: Insufficient documentation

## 2013-08-19 DIAGNOSIS — Z79899 Other long term (current) drug therapy: Secondary | ICD-10-CM | POA: Insufficient documentation

## 2013-08-19 DIAGNOSIS — M25559 Pain in unspecified hip: Secondary | ICD-10-CM | POA: Insufficient documentation

## 2013-08-19 DIAGNOSIS — M549 Dorsalgia, unspecified: Secondary | ICD-10-CM | POA: Insufficient documentation

## 2013-08-19 NOTE — Telephone Encounter (Signed)
Message copied by Waunita Schooner on Thu Aug 19, 2013  9:48 AM ------      Message from: Kelby Aline R      Created: Tue Aug 17, 2013  9:03 AM      Regarding: read please       I have refused her Lunesta refill with recent ER evaluation for accidental Overdose with pain medicines, I think she needs to be seen before any refills given and to also see about sleep study Estill Bamberg had suggested. I will have janie give her a call for appointment. ------

## 2013-08-19 NOTE — Progress Notes (Signed)
Radiation Oncology         [phone]) [phone] ________________________________  Name: [name] MRN: [id number]  Date: 08/19/2013  DOB: 11/13/1950  Follow-Up Visit Note  CC: Nadean Corwin, MD  Si Gaul, MD  Diagnosis:   Multiple myeloma  Interval Since Last Radiation:  4 months   Narrative:  The patient returns today for routine follow-up.  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 The patient is seen today at the request of Dr. Arbutus Ped with the patient complaining of some left hip pain in particular. She states that this has been present for a number of weeks. She does take pain medicine for this as well as some ongoing back discomfort. The patient denies significant worsening of this pain with ambulation, stating this most often bothers her when she lays down.                              ALLERGIES:  has No Known Allergies.  Meds: Current Outpatient Prescriptions  Medication Sig Dispense Refill  . ALPRAZolam (XANAX) 0.5 MG tablet Take 0.5 mg by mouth 3 (three) times daily as needed for anxiety.       Marland Kitchen atorvastatin (LIPITOR) 40 MG tablet Take 1 tablet (40 mg total) by mouth daily.  30 tablet  3  . dexamethasone (DECADRON) 4 MG tablet TAKE 10 TABLETS BY MOUTH ON A WEEKLY BASIS STARTING WITH THE FIRST CYCLE OF CHEMOTHERAPY.  80 tablet  2  . dexlansoprazole (DEXILANT) 60 MG capsule Take 1 capsule (60 mg total) by mouth daily.  30 capsule  6  . ESZOPICLONE 3 MG tablet Take 3 mg by mouth at bedtime.       . furosemide (LASIX) 40 MG tablet Take 40 mg by mouth 2 (two) times daily.      Marland Kitchen gabapentin (NEURONTIN) 600 MG tablet Take 600 mg by mouth 2 (two) times daily.       Marland Kitchen HYDROcodone-acetaminophen (NORCO) 7.5-325 MG per tablet Take 1 tablet by mouth every 6 (six) hours as needed (PAIN).      Marland Kitchen levothyroxine (SYNTHROID, LEVOTHROID) 100 MCG tablet Take 100 mcg by mouth daily.       Marland Kitchen  lidocaine-prilocaine (EMLA) cream APPLY TO PORT 1 TO 2 HRS PRIOR TO PROCEDURE  30 g  1  . loperamide (IMODIUM) 2 MG capsule Take 2 mg by mouth 4 (four) times daily as needed for diarrhea or loose stools.      Marland Kitchen morphine (MS CONTIN) 15 MG 12 hr tablet Take 1 tablet (15 mg total) by mouth 2 (two) times daily.  60 tablet  0  . Multiple Vitamin (MULITIVITAMIN WITH MINERALS) TABS Take 1 tablet by mouth daily.      . ondansetron (ZOFRAN-ODT) 4 MG disintegrating tablet Take 1 tablet by mouth every 8 (eight) hours as needed for nausea.       . potassium chloride (K-DUR,KLOR-CON) 10 MEQ tablet Take 1 tablet (10 mEq total) by mouth 2 (two) times daily.  60 tablet  3  . PRESCRIPTION MEDICATION carfilzomib (KYPROLIS) 56 mg in dextrose 5 % 50 mL chemo infusion 27 mg/m2  2.07 m2 (Treatment Plan Actual) Once 07/28/2013      . promethazine-dextromethorphan (PROMETHAZINE-DM) 6.25-15 MG/5ML syrup TAKE 5 MLS BY MOUTH 4 (FOUR) TIMES DAILY AS NEEDED FOR COUGH.  180 mL  0  . cyclobenzaprine (FLEXERIL)  10 MG tablet Take 10 mg by mouth daily as needed for muscle spasms.      . hyaluronate sodium (RADIAPLEXRX) GEL Apply 1 application topically 2 (two) times daily. Apply to affected skin area after rad txs and bedtime       No current facility-administered medications for this encounter.   Facility-Administered Medications Ordered in Other Encounters  Medication Dose Route Frequency Provider Last Rate Last Dose  . 0.9 %  sodium chloride infusion   Intravenous Once Curt Bears, MD      . ondansetron (ZOFRAN) IVPB 8 mg  8 mg Intravenous Once Curt Bears, MD      . sodium chloride 0.9 % injection 10 mL  10 mL Intracatheter PRN Curt Bears, MD        Physical Findings: The patient is in no acute distress. Patient is alert and oriented.  height is _0  (1.626 m) and weight is 201 lb 4.8 oz (91.309 kg). Her oral temperature is 98.6 F (37 C). Her blood pressure is 153/80 and her pulse is 112. Her respiration is  24. .   The patient is nontender to palpation in the pelvic region and the left hip.  Lab Findings: Lab Results  Component Value Date   WBC 8.3 08/16/2013   HGB 10.6* 08/16/2013   HCT 32.5* 08/16/2013   MCV 100.3 08/16/2013   PLT 271 08/16/2013     Radiographic Findings: Dg Chest 2 View  08/09/2013   CLINICAL DATA:  Altered mental status.  EXAM: CHEST  2 VIEW  COMPARISON:  DG RIBS UNILATERAL*R* dated 08/03/2013; DG CHEST 2 VIEW dated 08/03/2013  FINDINGS: Low lung volumes. Right-sided central venous catheter is appreciated with tip projected regions superior vena caval right atrial junction. Lungs are clear. Cardiac silhouette is enlarged. Osseous structures unremarkable.  IMPRESSION: No evidence of acute cardiopulmonary disease.   Electronically Signed   By: Margaree Mackintosh M.D.   On: 08/09/2013 21:14   Dg Chest 2 View  08/03/2013   CLINICAL DATA:  Pain post trauma  EXAM: CHEST  2 VIEW  COMPARISON:  Chest radiograph May 01, 2013 and chest CT May 01, 2013  FINDINGS: Port-A-Cath tip is at the cavoatrial junction. No pneumothorax. Lungs clear. Heart size and pulmonary vascularity are normal. No adenopathy. No bone lesions.  IMPRESSION: Lungs clear.  No pneumothorax.  No bone lesions.   Electronically Signed   By: Lowella Grip M.D.   On: 08/03/2013 14:51   Dg Ribs Unilateral Right  08/03/2013   CLINICAL DATA:  Right-sided chest wall pain  EXAM: RIGHT RIBS - 2 VIEW  COMPARISON:  Current chest radiograph and prior study dated 05/01/2013  FINDINGS: No rib fracture or lesion. No right pleural effusion or pneumothorax.  IMPRESSION: No fracture.   Electronically Signed   By: Lajean Manes M.D.   On: 08/03/2013 15:21   Dg Shoulder Right  08/08/2013   CLINICAL DATA:  Status post fall with shoulder pain  EXAM: RIGHT SHOULDER - 2+ VIEW  COMPARISON:  None.  FINDINGS: There is no evidence of fracture or dislocation. Mild degenerative joint changes of right acromioclavicular joint is noted. Soft tissues are  unremarkable.  IMPRESSION: No acute fracture or dislocation   Electronically Signed   By: Abelardo Diesel M.D.   On: 08/08/2013 19:02   Ct Head Wo Contrast  08/09/2013   CLINICAL DATA:  Altered mental status.  EXAM: CT HEAD WITHOUT CONTRAST  TECHNIQUE: Contiguous axial images were obtained from the base of  the skull through the vertex without intravenous contrast.  COMPARISON:  CT scan of February 24, 2013.  FINDINGS: Lytic lesions are again noted in the skull bilaterally with the largest seen in the right posterior parietal region. These are unchanged compared to prior exam and are most consistent with the history of multiple myeloma.  No mass effect or midline shift is noted. Ventricular size is within normal limits. There is no evidence of mass lesion, hemorrhage or acute infarction.  IMPRESSION: Stable lytic lesions involving the calvarium compared to prior exam and consistent with a history of multiple myeloma. No acute intracranial abnormality is noted.   Electronically Signed   By: Sabino Dick M.D.   On: 08/09/2013 21:14   Mr Hip Left W Wo Contrast  08/02/2013   CLINICAL DATA:  History of multiple myeloma.  EXAM: MRI OF THE LEFT HIP WITHOUT AND WITH CONTRAST  TECHNIQUE: Multiplanar, multisequence MR imaging was performed both before and after administration of intravenous contrast.  CONTRAST:  64mL MULTIHANCE GADOBENATE DIMEGLUMINE 529 MG/ML IV SOLN  COMPARISON:  MR L SPINE WO/W CM dated 01/11/2013; CT ABD/PELVIS W CM dated 01/26/2013; MR L SPINE WO/W CM dated 05/05/2010; CT ABD/PELVIS W CM dated 12/23/2012; CT ABD/PELVIS W CM dated 05/19/2012  FINDINGS: Bilateral femoral head AVN is present. There is no radiating bone marrow edema into the femoral necks. There is is single isolated osseous lesion in the left ischium measuring 23 mm by 20 mm on axial imaging. 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  There is a tiny enhancing lesion in the medial left iliac bone adjacent to the SI joint which is favored to represent and degenerative subchondral cyst given the adjacent degenerative changes of the SI joint. The muscular signal appears within normal limits. There is no muscular edema. The gluteal muscles appear normal and symmetric bilaterally. Visceral pelvis is within normal limits. The lumbar spine grossly appears similar to the prior exams, with pathologic compression fracture of L4. Negative for bursitis. There are no paralabral cyst or discrete labral tear is identified. No pathologic fracture.  IMPRESSION: 1. 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 2. 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 No surrounding edema or pathologic fracture. Compared to 05/19/2012, this lesion has increased in size from 19 mm long axis to 23 mm long axis. 3. Left SI joint osteoarthritis. 4. 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   Electronically Signed   By: Dereck Ligas M.D.   On: 08/02/2013 11:21    Impression:    The patient is complaining of some ongoing pain in the left hip region. She completed palliative radiation treatment to this site in October of 2014. Recent MRI scan of the left hip was personally reviewed and shows that this area appears stable versus recent imaging, somewhat enlarged versus more remote imaging from November of 2013. The patient's recent CT scan of the head showed stable lytic lesions within the calvarium.  I don't believe that the patient needs to undergo additional palliative radiation treatment at this time to the left hip. I discussed this with the patient and she will continue on systemic treatment.  Plan:  The patient will followup in our clinic in 6 weeks.  I spent 15 minutes with the patient today,  the majority  of which was spent counseling the patient on the diagnosis of cancer and coordinating care.   Jodelle Gross, M.D., Ph.D.

## 2013-08-19 NOTE — Progress Notes (Signed)
Please see the Nurse Progress Note in the MD Initial Consult Encounter for this patient. 

## 2013-08-19 NOTE — Progress Notes (Signed)
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 Palliative radiation tx 03/05/2011-03/21/11 to T2-T6 25Gy/184fPalliative radiation 05/05/2007-05/18/2007 L3-l5 30gY/10FX PATIENT SOB, RR=-24 SATS=97% ROOM AIR, c/o sob and chest discomfrot for 2 minutes after ambulating from lobby to nursing , wuill get w/c when patient leaves to conserve energy and stamina, steady gait, patient stated pain went away, appetite , fair, drinks protein shakes  2 daily, fatigue poor, very depressed and anxious , recent d/c hospital a lot of family dynamics,  9:19 AM

## 2013-08-19 NOTE — Telephone Encounter (Signed)
LMOM FOR PT 2 CALL & SCHEDULE

## 2013-08-19 NOTE — Progress Notes (Signed)
[facility] Psychosocial Distress Screening Clinical Social Work  Clinical Social Work was referred by distress screening protocol.  The patient scored a 10 on the Psychosocial Distress Thermometer which indicates severe distress. Clinical Social Worker met with patient in radiation oncology exam room to assess for distress and other psychosocial needs. CSW familiar with patient through medical oncology.  Mrs. Maffeo reports feeling very overwhelmed and stressed due to family dynamics and lack of support from partner.  The patient also expressed frustration with her lack of energy and feeling as though she needs to "put on a happy face" and "make others feel happy".  CSW and patient processed feelings associated with difficult family dynamics and explored possible solutions for a better "healing environment".  Patient's daughter was at visit today, patient reports daughter is very supportive and she has the option to live with daughter if needed.     Clinical Social Worker follow up needed: yes  If yes, follow up plan:  CSW will follow up with patient at next visit to re-evaluate and continue to provide emotional support.   Polo Riley, MSW, LCSW, OSW-C Clinical Social Worker [facility] [phone]

## 2013-08-24 ENCOUNTER — Other Ambulatory Visit: Payer: Medicare Other

## 2013-08-24 ENCOUNTER — Ambulatory Visit: Payer: Medicare Other

## 2013-08-24 ENCOUNTER — Telehealth: Payer: Self-pay | Admitting: Medical Oncology

## 2013-08-24 DIAGNOSIS — C9000 Multiple myeloma not having achieved remission: Secondary | ICD-10-CM

## 2013-08-24 NOTE — Telephone Encounter (Signed)
Per dr Julien Nordmann cancel chemo this week and schedule f/u

## 2013-08-25 ENCOUNTER — Ambulatory Visit: Payer: Medicare Other

## 2013-08-26 ENCOUNTER — Other Ambulatory Visit: Payer: Medicare Other

## 2013-08-26 ENCOUNTER — Ambulatory Visit (HOSPITAL_BASED_OUTPATIENT_CLINIC_OR_DEPARTMENT_OTHER): Payer: Medicare Other | Admitting: Internal Medicine

## 2013-08-26 ENCOUNTER — Encounter: Payer: Self-pay | Admitting: Internal Medicine

## 2013-08-26 ENCOUNTER — Telehealth: Payer: Self-pay | Admitting: Internal Medicine

## 2013-08-26 ENCOUNTER — Ambulatory Visit: Payer: Medicare Other | Admitting: Internal Medicine

## 2013-08-26 ENCOUNTER — Other Ambulatory Visit (HOSPITAL_BASED_OUTPATIENT_CLINIC_OR_DEPARTMENT_OTHER): Payer: Medicare Other

## 2013-08-26 VITALS — BP 146/76 | HR 115 | Temp 98.7°F | Resp 18 | Ht 64.0 in | Wt 201.3 lb

## 2013-08-26 DIAGNOSIS — C9000 Multiple myeloma not having achieved remission: Secondary | ICD-10-CM

## 2013-08-26 DIAGNOSIS — R05 Cough: Secondary | ICD-10-CM

## 2013-08-26 DIAGNOSIS — I509 Heart failure, unspecified: Secondary | ICD-10-CM

## 2013-08-26 DIAGNOSIS — R059 Cough, unspecified: Secondary | ICD-10-CM

## 2013-08-26 DIAGNOSIS — C9002 Multiple myeloma in relapse: Secondary | ICD-10-CM

## 2013-08-26 DIAGNOSIS — G609 Hereditary and idiopathic neuropathy, unspecified: Secondary | ICD-10-CM

## 2013-08-26 DIAGNOSIS — R0602 Shortness of breath: Secondary | ICD-10-CM

## 2013-08-26 LAB — COMPREHENSIVE METABOLIC PANEL (CC13)
ALBUMIN: 3.1 g/dL — AB (ref 3.5–5.0)
ALT: 17 U/L (ref 0–55)
AST: 16 U/L (ref 5–34)
Alkaline Phosphatase: 96 U/L (ref 40–150)
Anion Gap: 12 mEq/L — ABNORMAL HIGH (ref 3–11)
BUN: 6.6 mg/dL — ABNORMAL LOW (ref 7.0–26.0)
CHLORIDE: 110 meq/L — AB (ref 98–109)
CO2: 24 meq/L (ref 22–29)
Calcium: 9.9 mg/dL (ref 8.4–10.4)
Creatinine: 0.8 mg/dL (ref 0.6–1.1)
GLUCOSE: 114 mg/dL (ref 70–140)
POTASSIUM: 3.5 meq/L (ref 3.5–5.1)
Sodium: 146 mEq/L — ABNORMAL HIGH (ref 136–145)
TOTAL PROTEIN: 7.7 g/dL (ref 6.4–8.3)
Total Bilirubin: 0.89 mg/dL (ref 0.20–1.20)

## 2013-08-26 LAB — CBC WITH DIFFERENTIAL/PLATELET
BASO%: 0.3 % (ref 0.0–2.0)
Basophils Absolute: 0 10*3/uL (ref 0.0–0.1)
EOS ABS: 0 10*3/uL (ref 0.0–0.5)
EOS%: 0.3 % (ref 0.0–7.0)
HEMATOCRIT: 30.9 % — AB (ref 34.8–46.6)
HGB: 10 g/dL — ABNORMAL LOW (ref 11.6–15.9)
LYMPH%: 11.8 % — ABNORMAL LOW (ref 14.0–49.7)
MCH: 32.6 pg (ref 25.1–34.0)
MCHC: 32.4 g/dL (ref 31.5–36.0)
MCV: 100.6 fL (ref 79.5–101.0)
MONO#: 0.7 10*3/uL (ref 0.1–0.9)
MONO%: 9.8 % (ref 0.0–14.0)
NEUT%: 77.8 % — AB (ref 38.4–76.8)
NEUTROS ABS: 5.7 10*3/uL (ref 1.5–6.5)
Platelets: 226 10*3/uL (ref 145–400)
RBC: 3.08 10*6/uL — ABNORMAL LOW (ref 3.70–5.45)
RDW: 16.8 % — ABNORMAL HIGH (ref 11.2–14.5)
WBC: 7.4 10*3/uL (ref 3.9–10.3)
lymph#: 0.9 10*3/uL (ref 0.9–3.3)

## 2013-08-26 NOTE — Progress Notes (Signed)
[place] [phone]   Fax:([phone]) [place], [place] [address]  DIAGNOSIS: Recurrent multiple myeloma initially diagnosed in October 2008.   PRIOR THERAPY:  1. Status post palliative radiotherapy to the lumbar spine between L3 and L5. The patient received a total dose of 3000 cGy in 10 fractions under the care of Dr. Lisbeth Renshaw between May 05, 2007 through May 18, 2007. 2. Status post 5 cycles of systemic chemotherapy with Revlimid and low-dose Decadron with good response to this treatment. 3. Status post autologous peripheral blood stem cell transplant at [facility] on Nov 20, 2007 under the care of Dr. Valarie Merino. 4. Status post treatment for disease recurrence with Velcade, Doxil and Decadron. Last dose given Nov 09, 2009. Discontinued secondary to intolerance but the patient had a good response to treatment at that time. 5. Status post palliative radiotherapy to the T2-T6 thoracic vertebrae completed 03/21/2011 under the care of Dr. Lisbeth Renshaw. 6. Systemic chemotherapy with Velcade 1.3 mg per meter squared given on days 1, 4, 8 and 11, and Doxil at 30 mg per meter squared given on day 4 in addition to Decadron status post 4 cycles, discontinued secondary to intolerance. 7. Systemic therapy with Velcade 1.3 mg/M2 subcutaneously in addition to Decadron 40 mg by mouth on a weekly basis, status post 20 cycles. The patient had good response with this treatment but it is discontinue today secondary to worsening peripheral neuropathy. 8. Palliative radiotherapy to the skull lesion as well as the left hip area under the care of Dr. Lisbeth Renshaw.  CURRENT THERAPY:   1. Systemic chemotherapy with Carfilzomib 20 mg/M2 on days 1, 2,  8, 9, 13 and 16 every 4 weeks in addition to weekly Decadron 40 mg by mouth. First dose on 04/19/2013. Status post 4 cycles, discontinued recently secondary to  cardiac dysfunction. 2. Zometa 4 mg IV given every 3 months   INTERVAL HISTORY: [name] 63 y.o. female returns to the clinic today for follow up visit. The patient has been complaining recently with cough productive of yellowish sputum in addition to shortness of breath. She was treated with a course of Z-Pak and recently with some improvement in her condition. She was seen recently by Dr. Adriana Simas at [facility] who felt that her cough and shortness of breath may be due to congestive heart failure from Carfilzomib. Her recent myeloma panel showed no significant evidence for disease progression. Dr. Evelene Croon discussed with the patient and discontinuing her current treatment with Carfilzomib for now. The patient is here today for evaluation and discussion of her treatment options.  She still on Neurontin 600 mg by mouth 4 times a day. She is tolerating her treatment with Carfilzomib, and Decadron fairly well. She denied having any significant weight loss or night sweats. The patient denied having any nausea or vomiting. She has no chest pain, shortness of breath, cough or hemoptysis.   MEDICAL HISTORY: Past Medical History  Diagnosis Date  . Thyroid disease Hypothyroidism  . Hypercholesterolemia   . Compression fracture 04/08/2007    pathologic compression fracture  . Hypothyroidism   . FHx: chemotherapy     s/p 5 cycle revlimid/low dose decadron,s/p velcade,doxil,decadron,  . Hx of radiation therapy 05/05/07-05/18/07,& 03/05/11-03/21/11-    l3&l5 in 2008, t2-t6 03/2011  . GERD (gastroesophageal reflux disease)   . Insomnia     associated with steroids  . Nausea   .  Constipation     takes oxycontin,vicodin  . Hx of radiation therapy 05/05/2007 to 05/18/2007    palliative, L3-5  . 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 Hx of radiation therapy 03/05/2011 to 03/21/2011    palliative T2-T6, c-spine  . 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 PONV  (postoperative nausea and vomiting)   . Metastasis to bone     ALLERGIES:  has No Known Allergies.  MEDICATIONS:  Current Outpatient Prescriptions  Medication Sig Dispense Refill  . ALPRAZolam (XANAX) 0.5 MG tablet Take 0.5 mg by mouth 3 (three) times daily as needed for anxiety.       Marland Kitchen atorvastatin (LIPITOR) 40 MG tablet Take 1 tablet (40 mg total) by mouth daily.  30 tablet  3  . cyclobenzaprine (FLEXERIL) 10 MG tablet Take 10 mg by mouth daily as needed for muscle spasms.      Marland Kitchen dexamethasone (DECADRON) 4 MG tablet TAKE 10 TABLETS BY MOUTH ON A WEEKLY BASIS STARTING WITH THE FIRST CYCLE OF CHEMOTHERAPY.  80 tablet  2  . dexlansoprazole (DEXILANT) 60 MG capsule Take 1 capsule (60 mg total) by mouth daily.  30 capsule  6  . ESZOPICLONE 3 MG tablet Take 3 mg by mouth at bedtime.       . furosemide (LASIX) 40 MG tablet Take 40 mg by mouth 2 (two) times daily.      Marland Kitchen gabapentin (NEURONTIN) 600 MG tablet Take 600 mg by mouth 2 (two) times daily.       . hyaluronate sodium (RADIAPLEXRX) GEL Apply 1 application topically 2 (two) times daily. Apply to affected skin area after rad txs and bedtime      . HYDROcodone-acetaminophen (NORCO) 7.5-325 MG per tablet Take 1 tablet by mouth every 6 (six) hours as needed (PAIN).      Marland Kitchen levothyroxine (SYNTHROID, LEVOTHROID) 100 MCG tablet Take 100 mcg by mouth daily.       Marland Kitchen lidocaine-prilocaine (EMLA) cream APPLY TO PORT 1 TO 2 HRS PRIOR TO PROCEDURE  30 g  1  . loperamide (IMODIUM) 2 MG capsule Take 2 mg by mouth 4 (four) times daily as needed for diarrhea or loose stools.      Marland Kitchen morphine (MS CONTIN) 15 MG 12 hr tablet Take 1 tablet (15 mg total) by mouth 2 (two) times daily.  60 tablet  0  . Multiple Vitamin (MULITIVITAMIN WITH MINERALS) TABS Take 1 tablet by mouth daily.      . ondansetron (ZOFRAN-ODT) 4 MG disintegrating tablet Take 1 tablet by mouth every 8 (eight) hours as needed for nausea.       . potassium chloride (K-DUR,KLOR-CON) 10 MEQ tablet  Take 1 tablet (10 mEq total) by mouth 2 (two) times daily.  60 tablet  3  . PRESCRIPTION MEDICATION carfilzomib (KYPROLIS) 56 mg in dextrose 5 % 50 mL chemo infusion 27 mg/m2  2.07 m2 (Treatment Plan Actual) Once 07/28/2013      . promethazine-dextromethorphan (PROMETHAZINE-DM) 6.25-15 MG/5ML syrup TAKE 5 MLS BY MOUTH 4 (FOUR) TIMES DAILY AS NEEDED FOR COUGH.  180 mL  0   No current facility-administered medications for this visit.   Facility-Administered Medications Ordered in Other Visits  Medication Dose Route Frequency Provider Last Rate Last Dose  . 0.9 %  sodium chloride infusion   Intravenous Once Curt Bears, MD      . ondansetron (ZOFRAN) IVPB 8 mg  8 mg Intravenous Once [facility]  Avigayil Ton, MD      . sodium chloride 0.9 % injection 10 mL  10 mL Intracatheter PRN Curt Bears, MD        SURGICAL HISTORY:  Past Surgical History  Procedure Laterality Date  . Cholecystectomy    . Knee surgery    . Knee surgery    . Video bronchoscopy  07/30/2011    Procedure: VIDEO BRONCHOSCOPY WITHOUT FLUORO;  Surgeon: Kathee Delton, MD;  Location: [facility];  Service: Cardiopulmonary;  Laterality: Bilateral;    REVIEW OF SYSTEMS:  Constitutional: negative Eyes: negative Ears, nose, mouth, throat, and face: negative Respiratory: negative Cardiovascular: negative Gastrointestinal: negative Genitourinary:negative Integument/breast: negative Hematologic/lymphatic: negative Musculoskeletal:positive for back pain Neurological: positive for seizures Behavioral/Psych: negative Endocrine: negative Allergic/Immunologic: negative   PHYSICAL EXAMINATION: General appearance: alert, cooperative, fatigued and no distress Head: Normocephalic, without obvious abnormality, atraumatic Neck: no adenopathy, no JVD, supple, symmetrical, trachea midline and thyroid not enlarged, symmetric, no tenderness/mass/nodules Lymph nodes: Cervical, supraclavicular, and axillary nodes normal. Resp: clear to  auscultation bilaterally Back: symmetric, no curvature. ROM normal. No CVA tenderness. Cardio: regular rate and rhythm, S1, S2 normal, no murmur, click, rub or gallop GI: soft, non-tender; bowel sounds normal; no masses,  no organomegaly Extremities: extremities normal, atraumatic, no cyanosis or edema Neurologic: Alert and oriented X 3, normal strength and tone. Normal symmetric reflexes. Normal coordination and gait  ECOG PERFORMANCE STATUS: 1 - Symptomatic but completely ambulatory  Blood pressure 146/76, pulse 115, temperature 98.7 F (37.1 C), temperature source Oral, resp. rate 18, height 5' 4"  (1.626 m), weight 201 lb 4.8 oz (91.309 kg).  LABORATORY DATA: Lab Results  Component Value Date   WBC 7.4 08/26/2013   HGB 10.0* 08/26/2013   HCT 30.9* 08/26/2013   MCV 100.6 08/26/2013   PLT 226 08/26/2013      Chemistry      Component Value Date/Time   NA 138 08/16/2013 1600   NA 138 08/09/2013 2040   K 3.8 08/16/2013 1600   K 3.8 08/09/2013 2040   CL 100 08/09/2013 2040   CL 105 12/17/2012 1015   CO2 22 08/16/2013 1600   CO2 26 08/09/2013 2040   BUN 14.1 08/16/2013 1600   BUN 13 08/09/2013 2040   CREATININE 1.0 08/16/2013 1600   CREATININE 0.86 08/09/2013 2040      Component Value Date/Time   CALCIUM 10.7* 08/16/2013 1600   CALCIUM 7.6* 08/09/2013 2040   ALKPHOS 94 08/16/2013 1600   ALKPHOS 78 02/24/2013 1500   AST 16 08/16/2013 1600   AST 22 02/24/2013 1500   ALT 18 08/16/2013 1600   ALT 26 02/24/2013 1500   BILITOT 0.86 08/16/2013 1600   BILITOT 0.4 02/24/2013 1500       RADIOGRAPHIC STUDIES:  ASSESSMENT AND PLAN:  This is a very pleasant 62 years old [place] American female with recurrent multiple myeloma recently completed a course of treatment with Velcade and Decadron with improvement in her disease but this was discontinued secondary to peripheral neuropathy. The patient is tolerating her treatment with Carfilzomib and Decadron fairly well except for the recent shortness breath and cough  which was felt to be secondary to congestive heart failure from her treatment with Carfilzomib. I have a lengthy discussion with the patient today about her current disease status and treatment options. In the absence of any evidence for disease progression, I recommended for the patient to discontinue her current treatment with Carfilzomib at this point. I will continue her on observation with repeat  myeloma panel and skeletal bone survey in 2 months for evaluation of her disease. The patient agreed to the current plan. Her daughter, Baxter Flattery was on speaker telephone with [name] during this visit and she is also in agreement with the current plan. The patient will continue her current treatment with Zometa as scheduled. She was advised to call immediately if she has any concerning symptoms in the interval. The patient voices understanding of current disease status and treatment options and is in agreement with the current care plan.  All questions were answered. The patient knows to call the clinic with any problems, questions or concerns. We can certainly see the patient much sooner if necessary. I spent 15 minutes of face-to-face counseling with the patient today out of the total visit time 25 minutes.  Disclaimer: This note was dictated with voice recognition software. Similar sounding words can inadvertently be transcribed and may not be corrected upon review.

## 2013-08-26 NOTE — Telephone Encounter (Signed)
Pt called and r/s appt due to weather appt is now on 3/4 lab and MD

## 2013-08-26 NOTE — Telephone Encounter (Signed)
Gave pt appt for lab and scan and MD on April 2015

## 2013-08-30 ENCOUNTER — Inpatient Hospital Stay (HOSPITAL_COMMUNITY)
Admission: EM | Admit: 2013-08-30 | Discharge: 2013-09-03 | DRG: 194 | Disposition: A | Discharge status: Home Health | Payer: Medicare Other | Attending: Internal Medicine | Admitting: Internal Medicine

## 2013-08-30 ENCOUNTER — Emergency Department (HOSPITAL_COMMUNITY): Payer: Medicare Other

## 2013-08-30 ENCOUNTER — Encounter (HOSPITAL_COMMUNITY): Payer: Self-pay | Admitting: Emergency Medicine

## 2013-08-30 DIAGNOSIS — Z79899 Other long term (current) drug therapy: Secondary | ICD-10-CM

## 2013-08-30 DIAGNOSIS — G589 Mononeuropathy, unspecified: Secondary | ICD-10-CM | POA: Diagnosis present

## 2013-08-30 DIAGNOSIS — Z8679 Personal history of other diseases of the circulatory system: Secondary | ICD-10-CM

## 2013-08-30 DIAGNOSIS — T502X5A Adverse effect of carbonic-anhydrase inhibitors, benzothiadiazides and other diuretics, initial encounter: Secondary | ICD-10-CM | POA: Diagnosis present

## 2013-08-30 DIAGNOSIS — Z8701 Personal history of pneumonia (recurrent): Secondary | ICD-10-CM

## 2013-08-30 DIAGNOSIS — E039 Hypothyroidism, unspecified: Secondary | ICD-10-CM | POA: Diagnosis present

## 2013-08-30 DIAGNOSIS — E876 Hypokalemia: Secondary | ICD-10-CM | POA: Diagnosis present

## 2013-08-30 DIAGNOSIS — C9000 Multiple myeloma not having achieved remission: Secondary | ICD-10-CM | POA: Diagnosis present

## 2013-08-30 DIAGNOSIS — I10 Essential (primary) hypertension: Secondary | ICD-10-CM | POA: Diagnosis present

## 2013-08-30 DIAGNOSIS — R109 Unspecified abdominal pain: Secondary | ICD-10-CM | POA: Diagnosis present

## 2013-08-30 DIAGNOSIS — K219 Gastro-esophageal reflux disease without esophagitis: Secondary | ICD-10-CM | POA: Diagnosis present

## 2013-08-30 DIAGNOSIS — E785 Hyperlipidemia, unspecified: Secondary | ICD-10-CM | POA: Diagnosis present

## 2013-08-30 DIAGNOSIS — D649 Anemia, unspecified: Secondary | ICD-10-CM | POA: Diagnosis present

## 2013-08-30 DIAGNOSIS — G893 Neoplasm related pain (acute) (chronic): Secondary | ICD-10-CM | POA: Diagnosis present

## 2013-08-30 DIAGNOSIS — G8929 Other chronic pain: Secondary | ICD-10-CM | POA: Diagnosis present

## 2013-08-30 DIAGNOSIS — R1310 Dysphagia, unspecified: Secondary | ICD-10-CM | POA: Diagnosis present

## 2013-08-30 DIAGNOSIS — R197 Diarrhea, unspecified: Secondary | ICD-10-CM

## 2013-08-30 DIAGNOSIS — Z803 Family history of malignant neoplasm of breast: Secondary | ICD-10-CM

## 2013-08-30 DIAGNOSIS — Z9484 Stem cells transplant status: Secondary | ICD-10-CM

## 2013-08-30 DIAGNOSIS — Z923 Personal history of irradiation: Secondary | ICD-10-CM

## 2013-08-30 DIAGNOSIS — C9002 Multiple myeloma in relapse: Secondary | ICD-10-CM | POA: Diagnosis present

## 2013-08-30 DIAGNOSIS — Z87891 Personal history of nicotine dependence: Secondary | ICD-10-CM

## 2013-08-30 DIAGNOSIS — E7800 Pure hypercholesterolemia, unspecified: Secondary | ICD-10-CM | POA: Diagnosis present

## 2013-08-30 DIAGNOSIS — E119 Type 2 diabetes mellitus without complications: Secondary | ICD-10-CM | POA: Diagnosis present

## 2013-08-30 DIAGNOSIS — E782 Mixed hyperlipidemia: Secondary | ICD-10-CM | POA: Diagnosis present

## 2013-08-30 DIAGNOSIS — J189 Pneumonia, unspecified organism: Secondary | ICD-10-CM

## 2013-08-30 DIAGNOSIS — Z801 Family history of malignant neoplasm of trachea, bronchus and lung: Secondary | ICD-10-CM

## 2013-08-30 DIAGNOSIS — Z8673 Personal history of transient ischemic attack (TIA), and cerebral infarction without residual deficits: Secondary | ICD-10-CM

## 2013-08-30 LAB — CBC WITH DIFFERENTIAL/PLATELET
BASOS ABS: 0 10*3/uL (ref 0.0–0.1)
BASOS PCT: 0 % (ref 0–1)
EOS ABS: 0 10*3/uL (ref 0.0–0.7)
Eosinophils Relative: 0 % (ref 0–5)
HCT: 29.8 % — ABNORMAL LOW (ref 36.0–46.0)
Hemoglobin: 9.7 g/dL — ABNORMAL LOW (ref 12.0–15.0)
Lymphocytes Relative: 19 % (ref 12–46)
Lymphs Abs: 0.9 10*3/uL (ref 0.7–4.0)
MCH: 32.1 pg (ref 26.0–34.0)
MCHC: 32.6 g/dL (ref 30.0–36.0)
MCV: 98.7 fL (ref 78.0–100.0)
Monocytes Absolute: 0.5 10*3/uL (ref 0.1–1.0)
Monocytes Relative: 10 % (ref 3–12)
NEUTROS ABS: 3.4 10*3/uL (ref 1.7–7.7)
NEUTROS PCT: 72 % (ref 43–77)
Platelets: 232 10*3/uL (ref 150–400)
RBC: 3.02 MIL/uL — ABNORMAL LOW (ref 3.87–5.11)
RDW: 15.6 % — AB (ref 11.5–15.5)
WBC: 4.7 10*3/uL (ref 4.0–10.5)

## 2013-08-30 LAB — I-STAT CHEM 8, ED
BUN: 9 mg/dL (ref 6–23)
CALCIUM ION: 1.09 mmol/L — AB (ref 1.13–1.30)
CHLORIDE: 108 meq/L (ref 96–112)
Creatinine, Ser: 0.8 mg/dL (ref 0.50–1.10)
GLUCOSE: 103 mg/dL — AB (ref 70–99)
HEMATOCRIT: 31 % — AB (ref 36.0–46.0)
HEMOGLOBIN: 10.5 g/dL — AB (ref 12.0–15.0)
Potassium: 3.3 mEq/L — ABNORMAL LOW (ref 3.7–5.3)
Sodium: 143 mEq/L (ref 137–147)
TCO2: 22 mmol/L (ref 0–100)

## 2013-08-30 LAB — I-STAT TROPONIN, ED: Troponin i, poc: 0.01 ng/mL (ref 0.00–0.08)

## 2013-08-30 LAB — PRO B NATRIURETIC PEPTIDE: PRO B NATRI PEPTIDE: 12.6 pg/mL (ref 0–125)

## 2013-08-30 MED ORDER — LEVOTHYROXINE SODIUM 100 MCG PO TABS
100.0000 ug | ORAL_TABLET | Freq: Every day | ORAL | Status: DC
  Administered 2013-08-31 – 2013-09-03 (×4): 100 ug via ORAL
  Filled 2013-08-30 (×5): qty 1

## 2013-08-30 MED ORDER — ADULT MULTIVITAMIN W/MINERALS CH
1.0000 | ORAL_TABLET | Freq: Every day | ORAL | Status: DC
  Administered 2013-08-31 – 2013-09-03 (×4): 1 via ORAL
  Filled 2013-08-30 (×4): qty 1

## 2013-08-30 MED ORDER — ENOXAPARIN SODIUM 40 MG/0.4ML ~~LOC~~ SOLN
40.0000 mg | SUBCUTANEOUS | Status: DC
  Administered 2013-08-30 – 2013-09-02 (×4): 40 mg via SUBCUTANEOUS
  Filled 2013-08-30 (×5): qty 0.4

## 2013-08-30 MED ORDER — VANCOMYCIN HCL 10 G IV SOLR
1250.0000 mg | Freq: Two times a day (BID) | INTRAVENOUS | Status: DC
  Filled 2013-08-30: qty 1250

## 2013-08-30 MED ORDER — POTASSIUM CHLORIDE CRYS ER 10 MEQ PO TBCR
10.0000 meq | EXTENDED_RELEASE_TABLET | Freq: Two times a day (BID) | ORAL | Status: DC
  Administered 2013-08-30 – 2013-09-03 (×8): 10 meq via ORAL
  Filled 2013-08-30 (×9): qty 1

## 2013-08-30 MED ORDER — VANCOMYCIN HCL 10 G IV SOLR
1250.0000 mg | Freq: Two times a day (BID) | INTRAVENOUS | Status: DC
  Administered 2013-08-30 – 2013-09-03 (×8): 1250 mg via INTRAVENOUS
  Filled 2013-08-30 (×9): qty 1250

## 2013-08-30 MED ORDER — MORPHINE SULFATE ER 15 MG PO TBCR
15.0000 mg | EXTENDED_RELEASE_TABLET | Freq: Two times a day (BID) | ORAL | Status: DC
  Administered 2013-08-30 – 2013-09-03 (×8): 15 mg via ORAL
  Filled 2013-08-30 (×8): qty 1

## 2013-08-30 MED ORDER — DEXTROSE 5 % IV SOLN
2.0000 g | INTRAVENOUS | Status: AC
  Administered 2013-08-30: 2 g via INTRAVENOUS

## 2013-08-30 MED ORDER — SODIUM CHLORIDE 0.9 % IV SOLN
INTRAVENOUS | Status: DC
  Administered 2013-08-30 – 2013-09-02 (×4): via INTRAVENOUS

## 2013-08-30 MED ORDER — DEXTROSE 5 % IV SOLN: 1.0000 g | Freq: Three times a day (TID) | INTRAVENOUS | Status: DC

## 2013-08-30 MED ORDER — DEXTROMETHORPHAN POLISTIREX 30 MG/5ML PO LQCR
15.0000 mg | Freq: Four times a day (QID) | ORAL | Status: DC | PRN
  Filled 2013-08-30: qty 5

## 2013-08-30 MED ORDER — PROMETHAZINE-DM 6.25-15 MG/5ML PO SYRP: 5.0000 mL | ORAL_SOLUTION | Freq: Four times a day (QID) | ORAL | Status: DC | PRN

## 2013-08-30 MED ORDER — CLOPIDOGREL BISULFATE 75 MG PO TABS
75.0000 mg | ORAL_TABLET | Freq: Every day | ORAL | Status: DC
  Administered 2013-08-31 – 2013-09-03 (×4): 75 mg via ORAL
  Filled 2013-08-30 (×5): qty 1

## 2013-08-30 MED ORDER — HYDROCODONE-ACETAMINOPHEN 7.5-325 MG PO TABS
1.0000 | ORAL_TABLET | Freq: Four times a day (QID) | ORAL | Status: DC | PRN
  Administered 2013-08-31 – 2013-09-01 (×3): 1 via ORAL
  Filled 2013-08-30 (×3): qty 1

## 2013-08-30 MED ORDER — GABAPENTIN 600 MG PO TABS
600.0000 mg | ORAL_TABLET | Freq: Two times a day (BID) | ORAL | Status: DC
  Filled 2013-08-30: qty 1

## 2013-08-30 MED ORDER — ATORVASTATIN CALCIUM 40 MG PO TABS
40.0000 mg | ORAL_TABLET | Freq: Every day | ORAL | Status: DC
  Administered 2013-08-31 – 2013-09-03 (×4): 40 mg via ORAL
  Filled 2013-08-30 (×4): qty 1

## 2013-08-30 MED ORDER — IOHEXOL 350 MG/ML SOLN
100.0000 mL | Freq: Once | INTRAVENOUS | Status: AC | PRN
  Administered 2013-08-30: 100 mL via INTRAVENOUS

## 2013-08-30 MED ORDER — ONDANSETRON HCL 4 MG/2ML IJ SOLN
4.0000 mg | Freq: Four times a day (QID) | INTRAMUSCULAR | Status: DC | PRN
  Administered 2013-08-30 – 2013-08-31 (×3): 4 mg via INTRAVENOUS
  Filled 2013-08-30 (×3): qty 2

## 2013-08-30 MED ORDER — GABAPENTIN 300 MG PO CAPS
600.0000 mg | ORAL_CAPSULE | Freq: Two times a day (BID) | ORAL | Status: DC
  Administered 2013-08-30 – 2013-09-03 (×8): 600 mg via ORAL
  Filled 2013-08-30 (×9): qty 2

## 2013-08-30 MED ORDER — PANTOPRAZOLE SODIUM 40 MG PO TBEC
40.0000 mg | DELAYED_RELEASE_TABLET | Freq: Every day | ORAL | Status: DC
  Administered 2013-08-31 – 2013-09-03 (×4): 40 mg via ORAL
  Filled 2013-08-30 (×4): qty 1

## 2013-08-30 MED ORDER — ONDANSETRON HCL 4 MG PO TABS: 4.0000 mg | ORAL_TABLET | Freq: Four times a day (QID) | ORAL | Status: DC | PRN

## 2013-08-30 MED ORDER — VANCOMYCIN HCL IN DEXTROSE 1-5 GM/200ML-% IV SOLN
1000.0000 mg | Freq: Once | INTRAVENOUS | Status: DC
  Filled 2013-08-30: qty 200

## 2013-08-30 MED ORDER — PROMETHAZINE HCL 6.25 MG/5ML PO SYRP
6.2500 mg | ORAL_SOLUTION | Freq: Four times a day (QID) | ORAL | Status: DC | PRN
  Filled 2013-08-30: qty 5

## 2013-08-30 MED ORDER — FUROSEMIDE 40 MG PO TABS
40.0000 mg | ORAL_TABLET | Freq: Two times a day (BID) | ORAL | Status: DC
  Administered 2013-08-31 – 2013-09-01 (×3): 40 mg via ORAL
  Filled 2013-08-30 (×5): qty 1

## 2013-08-30 MED ORDER — ONDANSETRON 8 MG PO TBDP: 4.0000 mg | ORAL_TABLET | Freq: Three times a day (TID) | ORAL | Status: DC | PRN

## 2013-08-30 MED ORDER — CEFEPIME HCL 1 G IJ SOLR
1.0000 g | Freq: Three times a day (TID) | INTRAMUSCULAR | Status: DC
  Administered 2013-08-31 – 2013-09-03 (×10): 1 g via INTRAVENOUS
  Filled 2013-08-30 (×12): qty 1

## 2013-08-30 MED ORDER — INSULIN ASPART 100 UNIT/ML ~~LOC~~ SOLN: 0.0000 [IU] | Freq: Three times a day (TID) | SUBCUTANEOUS | Status: DC

## 2013-08-30 MED ORDER — ALPRAZOLAM 0.5 MG PO TABS: 0.5000 mg | ORAL_TABLET | Freq: Three times a day (TID) | ORAL | Status: DC | PRN

## 2013-08-30 MED ORDER — ZOLPIDEM TARTRATE 5 MG PO TABS
5.0000 mg | ORAL_TABLET | Freq: Every evening | ORAL | Status: DC | PRN
  Administered 2013-08-30: 5 mg via ORAL
  Filled 2013-08-30: qty 1

## 2013-08-30 NOTE — Progress Notes (Signed)
ANTIBIOTIC CONSULT NOTE - INITIAL  Pharmacy Consult for Vancomycin and Cefepime Indication: HCAP  No Known Allergies  Patient Measurements:   91.3 kg as of 08/26/13 64 in as of 08/26/13  Vital Signs: Temp: 97.6 F (36.4 C) (03/02 1645) Temp src: Oral (03/02 1645) BP: 140/85 mmHg (03/02 1719) Pulse Rate: 109 (03/02 1719) Intake/Output from previous day:   Intake/Output from this shift:    Labs:  Recent Labs  08/30/13 1815 08/30/13 1827  WBC 4.7  --   HGB 9.7* 10.5*  PLT 232  --   CREATININE  --  0.80   CrCl 82 ml/min/1.59m2 (normalized) The CrCl is unknown because both a height and weight (above a minimum accepted value) are required for this calculation. No results found for this basename: VANCOTROUGH, VANCOPEAK, VANCORANDOM, GENTTROUGH, GENTPEAK, GENTRANDOM, TOBRATROUGH, TOBRAPEAK, TOBRARND, AMIKACINPEAK, AMIKACINTROU, AMIKACIN,  in the last 72 hours   Microbiology: No results found for this or any previous visit (from the past 720 hour(s)).  Medical History: Past Medical History  Diagnosis Date  . Thyroid disease Hypothyroidism  . Hypercholesterolemia   . Compression fracture 04/08/2007    pathologic compression fracture  . Hypothyroidism   . FHx: chemotherapy     s/p 5 cycle revlimid/low dose decadron,s/p velcade,doxil,decadron,  . Hx of radiation therapy 05/05/07-05/18/07,& 03/05/11-03/21/11-    l3&l5 in 2008, t2-t6 03/2011  . GERD (gastroesophageal reflux disease)   . Insomnia     associated with steroids  . Nausea   . Constipation     takes oxycontin,vicodin  . Hx of radiation therapy 05/05/2007 to 05/18/2007    palliative, L3-5  . 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 Hx of radiation therapy 03/05/2011 to 03/21/2011    palliative T2-T6, c-spine  . 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 PONV (postoperative nausea and vomiting)   . Metastasis to bone    Assessment: 39 yof with MM s/p palliative chemo and radiation.  Reports she has had recurrent pneumonia for the past 4 weeks which she has been on 3 separate antibiotics. Chest CTA today is without acute PE however there is evidence of focal infiltrate concerning for pneumonia versus inflammation. Given recent hospitalization, treat as HCAP.   Goal of Therapy:  Vancomycin trough level 15-20 mcg/ml Cefepime dose per renal function  Plan:   Vancomycin 1g IV in ED, then continue with 1250mg  IV q12h Check trough at steady state Cefepime 2g IV in ED, then continue with 1g IV q8h Follow up renal function & cultures  Peggyann Juba, PharmD, BCPS Pager: [phone] 08/30/2013,8:16 PM

## 2013-08-30 NOTE — ED Notes (Signed)
Pt c/o shortness of breath, sts it's been going on for awhile but is much worse today, pt also reports pain in her legs and hands. Pt is requesting oxygen, however her sats are 100% and she is in NAD.

## 2013-08-30 NOTE — H&P (Addendum)
[facility] History and Physical  [name] XQJ:[id number] DOB: [birth year] DOA: 08/30/2013  Referring physician: ED PCP: [name], MD   Chief Complaint:  Progressive shortness of breath and fatigue for past 3 days  HPI:  64 y/o obese female with past medical hx of recurrent multiple myeloma status post palliative radiotherapy and chemotherapy, failed autologous stem cell transplant currently receiving systemic chemotherapy and palliative radiotherapy ,  history of CVA, hypertension, GERD, ? DM, with recent hospitalization for encephalopathy secondary to narcotic overuse was brought to the hospital with progressive shortness of breath and fatigue for past 3 days. Patient was treated with a course of azithromycin for bronchitis-like symptoms 3 weeks back and falling recent discharge was treated with amoxicillin for similar symptoms. She reports completing antibiotic about 10 days back. She felt better for a few days however again started having cough with whitish phlegm and shortness of breath. Since last 2 days she has been progressively short of breath and fatigue. She also reports low-grade fever. She has chronic diarrhea and has increased symptoms for past 3 days with associated nausea.  She also reports abdominal pain today (has chronic abdominal pain). Patient denies headache, blurred vision, chills, vomiting, chest pain, palpitations,urinary symptoms. Denies any weakness of the extremities or tingling or numbness. Patient does report some difficulty swallowing with sensation of food sticking in her throat for past few months. She was admitted to the hospital about 4 months back with healthcare associated pneumonia.  Course in the ED Vitals were stable. Blood work done and showed chronic anemia with hemoglobin of 10.5 and hematocrit of 31. Chemistry showed hypokalemia with potassium of 2.3. Chest x-ray was unremarkable. A CT angiogram of the chest was negative for acute  PE but showed nodular airspace disease in the right middle lobe and superior segment of right total suggestive of infection. Patient given a dose of IV vancomycin and cefepime for his And [facility] called for admission to medical floor.  Review of Systems:  Constitutional:  fever,  appetite change and fatigue.  denied chills or diaphoresis HEENT: Difficulty swallowing, Denies photophobia, eye pain, redness, hearing loss, ear pain, congestion, sore throat, rhinorrhea, sneezing, mouth sores,  neck pain, neck stiffness and tinnitus.   Respiratory: SOB, DOE, cough, denies chest tightness,  and wheezing.   Cardiovascular: Denies chest pain, palpitations and leg swelling.  Gastrointestinal:  nausea, vomiting, abdominal pain, diarrhea, denies constipation, blood in stool and abdominal distention.  Genitourinary: Denies dysuria, urgency, frequency, hematuria, flank pain and difficulty urinating.  Endocrine: Denies: hot or cold intolerance,  polyuria, polydipsia. Musculoskeletal: Back pain, Denies myalgias, joint swelling, arthralgias and gait problem.  Skin: Denies pallor, rash and wound.  Neurological: Denies dizziness, seizures, syncope, weakness, light-headedness, numbness and headaches.  Hematological: Denies adenopathy.  Psychiatric/Behavioral: Denies  confusion, nervousness, sleep disturbance and agitation   Past Medical History  Diagnosis Date  . Thyroid disease Hypothyroidism  . Hypercholesterolemia   . Compression fracture 04/08/2007    pathologic compression fracture  . Hypothyroidism   . FHx: chemotherapy     s/p 5 cycle revlimid/low dose decadron,s/p velcade,doxil,decadron,  . Hx of radiation therapy 05/05/07-05/18/07,& 03/05/11-03/21/11-    l3&l5 in 2008, t2-t6 03/2011  . GERD (gastroesophageal reflux disease)   . Insomnia     associated with steroids  . Nausea   . Constipation     takes oxycontin,vicodin  . Hx of radiation therapy 05/05/2007 to 05/18/2007    palliative,  L3-5  . 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 Hx of radiation therapy 03/05/2011 to 03/21/2011    palliative T2-T6, c-spine  . 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 PONV (postoperative nausea and vomiting)   . Metastasis to bone    Past Surgical History  Procedure Laterality Date  . Cholecystectomy    . Knee surgery    . Knee surgery    . Video bronchoscopy  07/30/2011    Procedure: VIDEO BRONCHOSCOPY WITHOUT FLUORO;  Surgeon: Kathee Delton, MD;  Location: [facility];  Service: Cardiopulmonary;  Laterality: Bilateral;   Social History:  reports that she quit smoking about 35 years ago. She has never used smokeless tobacco. She reports that she does not drink alcohol or use illicit drugs.  No Known Allergies  Family History  Problem Relation Age of Onset  . Cancer Brother     lung ca  . Cancer Maternal Uncle     colon  . Cancer Maternal Grandmother     breast ca    Prior to Admission medications   Medication Sig Start Date End Date Taking? Authorizing Provider  ALPRAZolam Duanne Moron) 0.5 MG tablet Take 0.5 mg by mouth 3 (three) times daily as needed for anxiety.  05/06/12  Yes Historical Provider, MD  atorvastatin (LIPITOR) 40 MG tablet Take 1 tablet (40 mg total) by mouth daily. 05/26/13  Yes Unk Pinto, MD  dexamethasone (DECADRON) 4 MG tablet TAKE 10 TABLETS BY MOUTH ON A WEEKLY BASIS STARTING WITH THE FIRST CYCLE OF CHEMOTHERAPY. 06/01/13  Yes Curt Bears, MD  dexlansoprazole (DEXILANT) 60 MG capsule Take 1 capsule (60 mg total) by mouth daily. 07/12/13  Yes Vicie Mutters, [name]  ESZOPICLONE 3 MG tablet Take 3 mg by mouth at bedtime.  07/08/11  Yes Historical Provider, MD  furosemide (LASIX) 40 MG tablet Take 40 mg by mouth 2 (two) times daily. 01/14/13  Yes Unk Pinto, MD  gabapentin (NEURONTIN) 600 MG tablet Take 600 mg by mouth 2 (two) times daily.    Yes Historical Provider, MD  HYDROcodone-acetaminophen (NORCO) 7.5-325 MG per  tablet Take 1 tablet by mouth every 6 (six) hours as needed (PAIN). 07/27/13  Yes Adrena E Johnson, [name]  levothyroxine (SYNTHROID, LEVOTHROID) 100 MCG tablet Take 100 mcg by mouth daily.    Yes Amy Johnnye Sima, DO  lidocaine-prilocaine (EMLA) cream APPLY TO PORT 1 TO 2 HRS PRIOR TO PROCEDURE 07/12/13  Yes Curt Bears, MD  loperamide (IMODIUM) 2 MG capsule Take 2 mg by mouth 4 (four) times daily as needed for diarrhea or loose stools.   Yes Historical Provider, MD  morphine (MS CONTIN) 15 MG 12 hr tablet Take 1 tablet (15 mg total) by mouth 2 (two) times daily. 08/16/13  Yes Carlton Adam, [name]  Multiple Vitamin (MULITIVITAMIN WITH MINERALS) TABS Take 1 tablet by mouth daily.   Yes Historical Provider, MD  ondansetron (ZOFRAN-ODT) 4 MG disintegrating tablet Take 1 tablet by mouth every 8 (eight) hours as needed for nausea.  11/30/12  Yes Historical Provider, MD  potassium chloride (K-DUR,KLOR-CON) 10 MEQ tablet Take 1 tablet (10 mEq total) by mouth 2 (two) times daily. 05/31/13  Yes Unk Pinto, MD  PRESCRIPTION MEDICATION carfilzomib (KYPROLIS) 56 mg in dextrose 5 % 50 mL chemo infusion 27 mg/m2  2.07 m2 (Treatment Plan Actual) Once 08/17/2013   Yes Historical Provider, MD  promethazine-dextromethorphan (PROMETHAZINE-DM) 6.25-15 MG/5ML syrup TAKE 5 MLS BY MOUTH 4 (FOUR) TIMES DAILY AS NEEDED FOR COUGH. 07/12/13  Yes Vicie Mutters, [name]     Physical Exam:  Filed Vitals:   08/30/13 1645 08/30/13 1719  BP: 123/64 140/85  Pulse: 103 109  Temp: 97.6 F (36.4 C)   TempSrc: Oral   Resp: 20 20  SpO2: 100% 100%    Constitutional: Vital signs reviewed.  Patient is a well-developed and well-nourished female in no acute distress. HEENT: no pallor, no icterus, moist oral mucosa, no cervical lymphadenopathy Cardiovascular: RRR, S1 normal, S2 normal, no MRG Chest: CTAB, no wheezes, rales, or rhonchi Abdominal: Soft. Mild diffuse tenderness which she reports to be chronic, non-distended, bowel  sounds are normal, no masses, organomegaly, or guarding present.  Ext: warm, no edema Neurological: A&O x3, non focal  Labs on Admission:  Basic Metabolic Panel:  Recent Labs Lab 08/26/13 1139 08/30/13 1827  NA 146* 143  K 3.5 3.3*  CL  --  108  CO2 24  --   GLUCOSE 114 103*  BUN 6.6* 9  CREATININE 0.8 0.80  CALCIUM 9.9  --    Liver Function Tests:  Recent Labs Lab 08/26/13 1139  AST 16  ALT 17  ALKPHOS 96  BILITOT 0.89  PROT 7.7  ALBUMIN 3.1*   No results found for this basename: LIPASE, AMYLASE,  in the last 168 hours No results found for this basename: AMMONIA,  in the last 168 hours CBC:  Recent Labs Lab 08/26/13 1139 08/30/13 1815 08/30/13 1827  WBC 7.4 4.7  --   NEUTROABS 5.7 3.4  --   HGB 10.0* 9.7* 10.5*  HCT 30.9* 29.8* 31.0*  MCV 100.6 98.7  --   PLT 226 232  --    Cardiac Enzymes: No results found for this basename: CKTOTAL, CKMB, CKMBINDEX, TROPONINI,  in the last 168 hours BNP: No components found with this basename: POCBNP,  CBG: No results found for this basename: GLUCAP,  in the last 168 hours  Radiological Exams on Admission: Dg Chest 2 View  08/30/2013   CLINICAL DATA:  Shortness of breath and cough. History of multiple myeloma.  EXAM: CHEST  2 VIEW  COMPARISON:  08/09/2013  FINDINGS: The lungs are clear without focal infiltrate, edema, pneumothorax or pleural effusion. The cardiopericardial silhouette is within normal limits for size. Right Port-A-Cath tip projects at the SVC/ RA junction. Telemetry leads overlie the chest.  IMPRESSION: No acute cardiopulmonary findings.   Electronically Signed   By: Misty Stanley M.D.   On: 08/30/2013 17:52   Ct Angio Chest Pe W/cm &/or Wo Cm  08/30/2013   CLINICAL DATA:  Multiple myeloma, shoulder pain, concern for pulmonary embolism. Marland Kitchen  EXAM: CT ANGIOGRAPHY CHEST WITH CONTRAST  TECHNIQUE: Multidetector CT imaging of the chest was performed using the standard protocol during bolus administration of  intravenous contrast. Multiplanar CT image reconstructions and MIPs were obtained to evaluate the vascular anatomy.  CONTRAST:  166mL OMNIPAQUE IOHEXOL 350 MG/ML SOLN  COMPARISON:  DG CHEST 2 VIEW dated 08/30/2013; CT ANGIO CHEST W/CM &/OR WO/CM dated 05/01/2013; CT ANGIO CHEST W/CM &/OR WO/CM dated 05/31/2012  FINDINGS: There are no filling defects within the pulmonary arteries to suggest pulmonary embolism. No acute findings aorta great vessels. No pericardial fluid. Esophagus normal.  No axillary supraclavicular adenopathy. Port in the right anterior chest wall. No mediastinal hilar lymphadenopathy. No pericardial fluid  Review of the lung parenchyma demonstrates mild bronchiectasis in the upper lobes. There is scattered nodular airspace disease within the right lung. There is some mild consolidation in the medial segment  right middle lobe (image 54). There is a branching nodular pattern in the superior segment of the right lower lobe (image 43).  Limited view of the upper abdomen is unremarkable.  Review of the MIP images confirms the above findings.  IMPRESSION: 1. No evidence of acute pulmonary embolism. 2. Nodular airspace disease in the right middle lobe and superior segment right lower lobe suggests in pulmonary infection or inflammation. 3. Upper lobe bronchiectasis is similar prior.   Electronically Signed   By: Suzy Bouchard M.D.   On: 08/30/2013 19:24    EKG:   Assessment/Plan  Principal Problem:   HCAP (healthcare-associated pneumonia) Admit to [facility] IV vancomycin and cefepime. Follow blood culture, sputum culture, urine for strep antigen and Legionella ag. Patient was admitted 4 months back for healthcare associated pneumonia and reports dysphagia symptoms. Symptoms of aspiration is concerning. We'll get a swallow eval.  Active Problems: Multiple myeloma Status post palliative radiotherapy to the spine and systemic chemotherapy . Failed autologous stem cell transplant.   Follows with Dr. Julien Nordmann and Dr. Lisbeth Renshaw. Currently getting systemic chemotherapy every 4 weeks with weekly Decadron.     HYPOTHYROIDISM Continue Synthroid  GERD Continue PPI  Normocytic anemia Stable  Hypokalemia Replenish  History of CVA Continue statin. Patient was on Plavix which has been discontinued for reasons unclear. I would resume her Plavix.   Diabetes mellitus A1c of 5.9 from January 2015. We'll place her on sliding scale insulin. Patient was on daily metformin at home.   Abdominal pain and diarrhea Symptoms appear to be chronic but since patient reported to have worsened for possible days and being on antibiotics recently Will rule out  for C. Difficile.  Diet: Clear liquids  DVT prophylaxis: sq lovenox   Code Status: full code Family Communication: discussed with son and daughter None at bedside Disposition Plan: Home once improved  Ranesha Val, [place] [facility] Pager [phone]  Total time spent on admission :70 minutes  If 7PM-7AM, please contact night-coverage [email or web address] Password [email or web address] 08/30/2013, 9:08 PM

## 2013-08-30 NOTE — ED Provider Notes (Signed)
CSN: [id number]     Arrival date & time 08/30/13  1631 History   First MD Initiated Contact with Patient 08/30/13 1707     Chief Complaint  Patient presents with  . Shortness of Breath     (Consider location/radiation/quality/duration/timing/severity/associated sxs/prior Treatment) HPI  63 year old female with history of multiple myeloma status post radiation and chemotherapy, thyroid disease, HLD who presents complaining of shortness of breath and neuropathic pain of her fingers, legs and feet bilaterally. The shortness of breath episode happened this morning while at rest, accompanied by dizziness and diaphoresis but without chest pain. This shortness of breath has happened previously over the past 2 months, ever since her chemotherapy medication was changed in December. She was told shortness of breath could be an adverse reaction to the new medication before she began taking it. Patient has had chest pain previously but not currently, which is described as substernal tightness without radiation, lasting a minute or less at a time. She reports a recent history of Heart Failure likely 2/2 chemo treatment. Patient complains of large amounts of diarrhea without blood 3-4 times a day for the past 4 days, but says she has been thirsty and has tried to stay hydrated by drinking plenty of fluids. She has not traveled recently or been around anyone who is sick. The pain in her fingers, legs, and toes is described as "tingly and burning." NO other significant risk factors for PE/DVT except hx of cancer, age.      Past Medical History  Diagnosis Date  . Thyroid disease Hypothyroidism  . Hypercholesterolemia   . Compression fracture 04/08/2007    pathologic compression fracture  . Hypothyroidism   . FHx: chemotherapy     s/p 5 cycle revlimid/low dose decadron,s/p velcade,doxil,decadron,  . Hx of radiation therapy 05/05/07-05/18/07,& 03/05/11-03/21/11-    l3&l5 in 2008, t2-t6 03/2011  . GERD  (gastroesophageal reflux disease)   . Insomnia     associated with steroids  . Nausea   . Constipation     takes oxycontin,vicodin  . Hx of radiation therapy 05/05/2007 to 05/18/2007    palliative, L3-5  . 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 Hx of radiation therapy 03/05/2011 to 03/21/2011    palliative T2-T6, c-spine  . 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 PONV (postoperative nausea and vomiting)   . Metastasis to bone    Past Surgical History  Procedure Laterality Date  . Cholecystectomy    . Knee surgery    . Knee surgery    . Video bronchoscopy  07/30/2011    Procedure: VIDEO BRONCHOSCOPY WITHOUT FLUORO;  Surgeon: Kathee Delton, MD;  Location: [facility];  Service: Cardiopulmonary;  Laterality: Bilateral;   Family History  Problem Relation Age of Onset  . Cancer Brother     lung ca  . Cancer Maternal Uncle     colon  . Cancer Maternal Grandmother     breast ca   History  Substance Use Topics  . Smoking status: Former Smoker -- 0.25 packs/day for 6 years    Quit date: 08/27/1978  . Smokeless tobacco: Never Used  . Alcohol Use: No   OB History   Grav Para Term Preterm Abortions TAB SAB Ect Mult Living                 Review of Systems  Constitutional: Positive for diaphoresis and fatigue. Negative for chills.  HENT: Negative.   Eyes:  Negative.   Respiratory: Positive for chest tightness and shortness of breath.   Cardiovascular: Positive for chest pain.  Gastrointestinal: Positive for diarrhea. Negative for blood in stool.  Endocrine: Negative.   Genitourinary: Negative.  Negative for dysuria.  Musculoskeletal: Positive for back pain.  Skin: Negative.   Allergic/Immunologic: Positive for immunocompromised state.  Neurological: Positive for dizziness and numbness.  Hematological:       Patient states she has history of multiple myeloma.  Psychiatric/Behavioral: Negative.     Pt states she has had shortness of breath  and an unproductive cough. Dizziness is described as inability to maintain balance, though patient endorses baseline history of poor balance. Patient denies headache, fever, chills, vomiting, constipation, and dysuria.  Allergies  Review of patient's allergies indicates no known allergies.  Home Medications   Current Outpatient Rx  Name  Route  Sig  Dispense  Refill  . ALPRAZolam (XANAX) 0.5 MG tablet   Oral   Take 0.5 mg by mouth 3 (three) times daily as needed for anxiety.          Marland Kitchen atorvastatin (LIPITOR) 40 MG tablet   Oral   Take 1 tablet (40 mg total) by mouth daily.   30 tablet   3   . cyclobenzaprine (FLEXERIL) 10 MG tablet   Oral   Take 10 mg by mouth daily as needed for muscle spasms.         Marland Kitchen dexamethasone (DECADRON) 4 MG tablet      TAKE 10 TABLETS BY MOUTH ON A WEEKLY BASIS STARTING WITH THE FIRST CYCLE OF CHEMOTHERAPY.   80 tablet   2   . dexlansoprazole (DEXILANT) 60 MG capsule   Oral   Take 1 capsule (60 mg total) by mouth daily.   30 capsule   6   . ESZOPICLONE 3 MG tablet   Oral   Take 3 mg by mouth at bedtime.          . furosemide (LASIX) 40 MG tablet   Oral   Take 40 mg by mouth 2 (two) times daily.         Marland Kitchen gabapentin (NEURONTIN) 600 MG tablet   Oral   Take 600 mg by mouth 2 (two) times daily.          . hyaluronate sodium (RADIAPLEXRX) GEL   Topical   Apply 1 application topically 2 (two) times daily. Apply to affected skin area after rad txs and bedtime         . HYDROcodone-acetaminophen (NORCO) 7.5-325 MG per tablet   Oral   Take 1 tablet by mouth every 6 (six) hours as needed (PAIN).         Marland Kitchen levothyroxine (SYNTHROID, LEVOTHROID) 100 MCG tablet   Oral   Take 100 mcg by mouth daily.          Marland Kitchen lidocaine-prilocaine (EMLA) cream      APPLY TO PORT 1 TO 2 HRS PRIOR TO PROCEDURE   30 g   1   . loperamide (IMODIUM) 2 MG capsule   Oral   Take 2 mg by mouth 4 (four) times daily as needed for diarrhea or loose  stools.         Marland Kitchen morphine (MS CONTIN) 15 MG 12 hr tablet   Oral   Take 1 tablet (15 mg total) by mouth 2 (two) times daily.   60 tablet   0   . Multiple Vitamin (MULITIVITAMIN WITH MINERALS) TABS   Oral  Take 1 tablet by mouth daily.         . ondansetron (ZOFRAN-ODT) 4 MG disintegrating tablet   Oral   Take 1 tablet by mouth every 8 (eight) hours as needed for nausea.          . potassium chloride (K-DUR,KLOR-CON) 10 MEQ tablet   Oral   Take 1 tablet (10 mEq total) by mouth 2 (two) times daily.   60 tablet   3   . PRESCRIPTION MEDICATION      carfilzomib (KYPROLIS) 56 mg in dextrose 5 % 50 mL chemo infusion 27 mg/m2  2.07 m2 (Treatment Plan Actual) Once 07/28/2013         . promethazine-dextromethorphan (PROMETHAZINE-DM) 6.25-15 MG/5ML syrup      TAKE 5 MLS BY MOUTH 4 (FOUR) TIMES DAILY AS NEEDED FOR COUGH.   180 mL   0    BP 123/64  Pulse 103  Temp(Src) 97.6 F (36.4 C) (Oral)  Resp 20  SpO2 100% Physical Exam  Constitutional: No distress.  Eyes: EOM are normal. Right eye exhibits no nystagmus. Left eye exhibits no nystagmus.  Cardiovascular: Regular rhythm and intact distal pulses.  Exam reveals no gallop and no friction rub.   No murmur heard. Pulses:      Dorsalis pedis pulses are 2+ on the right side, and 2+ on the left side.  Heart rate slightly elevated.  Pulmonary/Chest: Effort normal and breath sounds normal. She has no wheezes. She has no rales.  Abdominal: Bowel sounds are normal. There is tenderness in the epigastric area. There is no guarding.  mild epigastric tenderness, no guarding.   Musculoskeletal: She exhibits tenderness.  Diffused tenderness to legs, feet and toes bilaterally.  Skin: Skin is warm and dry. No rash noted. She is not diaphoretic. No cyanosis or erythema.    Patient seated in no apparent distress. Vital signs are stable with exception of slightly elevated heart rate. Extraocular movements intact without nystagmus.  Intact sensation to face, arms and legs. No murmurs, gallops or rubs. Lungs clear to auscultation bilaterally both anteriorly and posteriorly. Positive bowel sounds with mild epigastric tenderness noted upon palpation. Finger capillary refill less than 2 seconds, unable to assess toes due to pain. 5/5 strength in palmar grasp bilaterally, plantar and dorsiflexion 5/5 bilaterally as well. Mild pitting edema of lower extremities bilaterally.   ED Course  Procedures (including critical care time)   Date: 08/30/2013  Rate: 105  Rhythm: sinus tachycardia  QRS Axis: normal  Intervals: normal  ST/T Wave abnormalities: nonspecific T wave changes  Conduction Disutrbances:none  Narrative Interpretation: multiple PVC  Old EKG Reviewed: none available    7:40 PM Patient reports she has had recurrent pneumonia for the past 4 weeks which she has been on 3 separate antibiotics. Chest CTA today is without acute PE however there is evidence of focal infiltrate concerning for pneumonia versus inflammation. Given that patient has subjective fever, having productive cough, and increased shortness of breath she may need further treatment for pneumonia. Given the recent hospitalization last month altered mental status, patient would need to be treated for HCAP. Care discussed with Dr. Juleen China  Diarrhea may also related to c.diff from recent abx use.  Will sent c.diff   8:25 PM i have consulted with Hospitalist, Dr. Gonzella Lex who agrees to admit to med surg, team 8, under his care.  Will start pt on Vanc and Cefepime as treatment for HCAP.  Labs Review Labs Reviewed  CBC WITH DIFFERENTIAL -  Abnormal; Notable for the following:    RBC 3.02 (*)    Hemoglobin 9.7 (*)    HCT 29.8 (*)    RDW 15.6 (*)    All other components within normal limits  I-STAT CHEM 8, ED - Abnormal; Notable for the following:    Potassium 3.3 (*)    Glucose, Bld 103 (*)    Calcium, Ion 1.09 (*)    Hemoglobin 10.5 (*)    HCT 31.0 (*)     All other components within normal limits  CLOSTRIDIUM DIFFICILE BY PCR  PRO B NATRIURETIC PEPTIDE  I-STAT TROPOININ, ED   Imaging Review Dg Chest 2 View  08/30/2013   CLINICAL DATA:  Shortness of breath and cough. History of multiple myeloma.  EXAM: CHEST  2 VIEW  COMPARISON:  08/09/2013  FINDINGS: The lungs are clear without focal infiltrate, edema, pneumothorax or pleural effusion. The cardiopericardial silhouette is within normal limits for size. Right Port-A-Cath tip projects at the SVC/ RA junction. Telemetry leads overlie the chest.  IMPRESSION: No acute cardiopulmonary findings.   Electronically Signed   By: Misty Stanley M.D.   On: 08/30/2013 17:52   Ct Angio Chest Pe W/cm &/or Wo Cm  08/30/2013   CLINICAL DATA:  Multiple myeloma, shoulder pain, concern for pulmonary embolism. Marland Kitchen  EXAM: CT ANGIOGRAPHY CHEST WITH CONTRAST  TECHNIQUE: Multidetector CT imaging of the chest was performed using the standard protocol during bolus administration of intravenous contrast. Multiplanar CT image reconstructions and MIPs were obtained to evaluate the vascular anatomy.  CONTRAST:  115m OMNIPAQUE IOHEXOL 350 MG/ML SOLN  COMPARISON:  DG CHEST 2 VIEW dated 08/30/2013; CT ANGIO CHEST W/CM &/OR WO/CM dated 05/01/2013; CT ANGIO CHEST W/CM &/OR WO/CM dated 05/31/2012  FINDINGS: There are no filling defects within the pulmonary arteries to suggest pulmonary embolism. No acute findings aorta great vessels. No pericardial fluid. Esophagus normal.  No axillary supraclavicular adenopathy. Port in the right anterior chest wall. No mediastinal hilar lymphadenopathy. No pericardial fluid  Review of the lung parenchyma demonstrates mild bronchiectasis in the upper lobes. There is scattered nodular airspace disease within the right lung. There is some mild consolidation in the medial segment right middle lobe (image 54). There is a branching nodular pattern in the superior segment of the right lower lobe (image 43).  Limited view  of the upper abdomen is unremarkable.  Review of the MIP images confirms the above findings.  IMPRESSION: 1. No evidence of acute pulmonary embolism. 2. Nodular airspace disease in the right middle lobe and superior segment right lower lobe suggests in pulmonary infection or inflammation. 3. Upper lobe bronchiectasis is similar prior.   Electronically Signed   By: SSuzy BouchardM.D.   On: 08/30/2013 19:24     EKG Interpretation None      MDM   Final diagnoses:  HCAP (healthcare-associated pneumonia)  Diarrhea    BP 140/85  Pulse 109  Temp(Src) 97.6 F (36.4 C) (Oral)  Resp 20  SpO2 100%  I have reviewed nursing notes and vital signs. I personally reviewed the imaging tests through PACS system  I reviewed available ER/hospitalization records thought the EMR     BDomenic Moras [place]03/02/15 2026

## 2013-08-30 NOTE — ED Notes (Signed)
On ambulation patient Oxygen 100% HR increased to 113.

## 2013-08-31 DIAGNOSIS — Z923 Personal history of irradiation: Secondary | ICD-10-CM

## 2013-08-31 DIAGNOSIS — C9000 Multiple myeloma not having achieved remission: Secondary | ICD-10-CM

## 2013-08-31 DIAGNOSIS — G8929 Other chronic pain: Secondary | ICD-10-CM

## 2013-08-31 LAB — BASIC METABOLIC PANEL
BUN: 9 mg/dL (ref 6–23)
CO2: 22 mEq/L (ref 19–32)
Calcium: 8.3 mg/dL — ABNORMAL LOW (ref 8.4–10.5)
Chloride: 105 mEq/L (ref 96–112)
Creatinine, Ser: 0.6 mg/dL (ref 0.50–1.10)
GFR calc Af Amer: >90 mL/min (ref >90)
GLUCOSE: 127 mg/dL — AB (ref 70–99)
POTASSIUM: 3.5 meq/L — AB (ref 3.7–5.3)
SODIUM: 139 meq/L (ref 137–147)

## 2013-08-31 LAB — STREP PNEUMONIAE URINARY ANTIGEN: Strep Pneumo Urinary Antigen: NEGATIVE

## 2013-08-31 LAB — LEGIONELLA ANTIGEN, URINE: Legionella Antigen, Urine: NEGATIVE

## 2013-08-31 LAB — GLUCOSE, CAPILLARY
GLUCOSE-CAPILLARY: 103 mg/dL — AB (ref 70–99)
Glucose-Capillary: 118 mg/dL — ABNORMAL HIGH (ref 70–99)
Glucose-Capillary: 88 mg/dL (ref 70–99)
Glucose-Capillary: 95 mg/dL (ref 70–99)
Glucose-Capillary: 99 mg/dL (ref 70–99)

## 2013-08-31 LAB — HIV ANTIBODY (ROUTINE TESTING W REFLEX): HIV: NONREACTIVE

## 2013-08-31 LAB — CLOSTRIDIUM DIFFICILE BY PCR: CDIFFPCR: NEGATIVE

## 2013-08-31 MED ORDER — POTASSIUM CHLORIDE CRYS ER 20 MEQ PO TBCR
40.0000 meq | EXTENDED_RELEASE_TABLET | Freq: Once | ORAL | Status: AC
  Administered 2013-08-31: 40 meq via ORAL
  Filled 2013-08-31: qty 2

## 2013-08-31 MED ORDER — MAGIC MOUTHWASH W/LIDOCAINE
5.0000 mL | Freq: Three times a day (TID) | ORAL | Status: DC
  Administered 2013-08-31 – 2013-09-03 (×5): 5 mL via ORAL
  Filled 2013-08-31 (×12): qty 5

## 2013-08-31 NOTE — Clinical Social Work Note (Signed)
[place] Work  Clinical Social Work familiar with patient from outpatient setting at [facility].  CSW met with patient, patient's daughter and son at bedtime to follow up and provide emotional support.  Mrs. Thalman stated she is feeling much better since being admitted.  CSW and patient discussed patient's possible living situations in the near future; patient considering moving in with children for additional support. CSW was notified by inpatient CSW of advance directives referral. CSW met with patient at bedside to review and complete healthcare advance directives. The patient designated patient's daughter Eustace Pen as their primary healthcare agent and son Wille Glaser as their secondary agent.  Patient also completed healthcare living will.    Clinical Social Worker notarized documents and made copies for patient/family. Clinical Social Worker placed copy in patient's chart. Clinical Social Worker encouraged patient/family to contact with any additional questions or concerns.  Polo Riley, MSW, [place] Worker [facility] [phone]

## 2013-08-31 NOTE — Evaluation (Signed)
Clinical/Bedside Swallow Evaluation Patient Details  Name: [name] MRN: [id number] Date of Birth: June 14, 1951  Today's Date: 08/31/2013 Time: 2536-6440 SLP Time Calculation (min): 28 min  Past Medical History:  Past Medical History  Diagnosis Date  . Thyroid disease Hypothyroidism  . Hypercholesterolemia   . Compression fracture 04/08/2007    pathologic compression fracture  . Hypothyroidism   . FHx: chemotherapy     s/p 5 cycle revlimid/low dose decadron,s/p velcade,doxil,decadron,  . Hx of radiation therapy 05/05/07-05/18/07,& 03/05/11-03/21/11-    l3&l5 in 2008, t2-t6 03/2011  . GERD (gastroesophageal reflux disease)   . Insomnia     associated with steroids  . Nausea   . Constipation     takes oxycontin,vicodin  . Hx of radiation therapy 05/05/2007 to 05/18/2007    palliative, L3-5  . 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 Hx of radiation therapy 03/05/2011 to 03/21/2011    palliative T2-T6, c-spine  . 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 PONV (postoperative nausea and vomiting)   . Metastasis to bone    Past Surgical History:  Past Surgical History  Procedure Laterality Date  . Cholecystectomy    . Knee surgery    . Knee surgery    . Video bronchoscopy  07/30/2011    Procedure: VIDEO BRONCHOSCOPY WITHOUT FLUORO;  Surgeon: Kathee Delton, MD;  Location: [facility];  Service: Cardiopulmonary;  Laterality: Bilateral;   HPI:  63 yo female adm to [facility] with increased cough, congestion recently.  Pt has recurrent multple myeloma s/p pallaitive radiation/chemo, previous smoker quit 34 years ago, GERD.  Pt CT chest showed ? pulmonary inflammation or pna.  Pt has seen ENT who informed her that voice issues were due to chronic secretions- pt states she received ABX with improvement but recurred after treatment completed.  MD ordered bedside swallow evaluation.  Mucositis has been an issue in the past during chemo requiring hospitalization.      Assessment / Plan / Recommendation Clinical Impression  Pt appears with functional oropharyngeal swallow without overt cranial nerve deficit.  Xerostomia reported for which pt reports Biotene does not improve.  Observed pt consuming water and jello - pt intentionally consumes very small bites/sips.  Suspect pt's symptoms are consistent with possible esophageal deficits given h/o chemorad tx, mucositis and GERD.  SLP questions referrant sensation from distal esophagus to pharynx due to crossover of vagus nerve.  Pt is cautious with intake and manages her dysphagia independently.  She verbalizes that she "chokes" easily and at times expels food/drinks mixed with viscous secretions.  Pharyngeal stasis sensation indicated that pt must wait for it to "clear", but also esophageal stasis reported.      Advised pt to compensation techniques to mitigate dysphagia = of which she is conducting most independently.  Advised pt monitor her swallow function closely due to risk of possible esophageal narrowings s/p XRT.     Recommend advance diet to regular to allow pt to consume po as tolerated given her adequate awareness.  SLP to sign off, thanks for this referral.      Aspiration Risk  Mild (regarding oropharyngeal swallow )    Diet Recommendation Regular;Thin liquid   Liquid Administration via: Cup;Straw Medication Administration:  (as tolerated) Supervision: Patient able to self feed Compensations: Slow rate;Small sips/bites (start meal with liquids, follow solids with liquids, eat several small meals/day) Postural Changes and/or Swallow Maneuvers: Upright 30-60 min after meal;Seated upright 90 degrees  Other  Recommendations Recommended Consults: Consider esophageal assessment (if dysphagia does not improve) Oral Care Recommendations: Oral care BID   Follow Up Recommendations  None    Frequency and Duration   n/a     Pertinent Vitals/Pain Afebrile, decreased      Swallow Study Prior  Functional Status   pt with h/o dysphagia    General Date of Onset: 08/31/13 HPI: 63 yo female adm to [facility] with increased cough, congestion recently.  Pt has recurrent multple myeloma s/p pallaitive radiation/chemo, previous smoker quit 34 years ago, GERD.  Pt CT chest showed ? pulmonary inflammation or pna.  Pt has seen ENT who informed her that voice issues were due to chronic secretions- pt states she received ABX with improvement but recurred after treatment completed.  MD ordered bedside swallow evaluation.  Mucositis has been an issue in the past during chemo requiring hospitalization.   Type of Study: Bedside swallow evaluation Previous Swallow Assessment: none in epic and none completed per pt Diet Prior to this Study: Thin liquids (clears) Respiratory Status: Room air History of Recent Intubation: No Behavior/Cognition: Alert;Cooperative;Pleasant mood Oral Cavity - Dentition: Adequate natural dentition Self-Feeding Abilities: Able to feed self Patient Positioning: Upright in bed Baseline Vocal Quality: Clear (pt states at times her cough is productive, but not consistently) Volitional Cough: Strong Volitional Swallow: Able to elicit    Oral/Motor/Sensory Function Overall Oral Motor/Sensory Function:  (appearance of sluggish palatal elevation, otherwise no overt CN deficits impacting swallowing)   Ice Chips Ice chips: Not tested   Thin Liquid Thin Liquid: Within functional limits Presentation: Self Fed;Cup    Nectar Thick Nectar Thick Liquid: Not tested   Honey Thick Honey Thick Liquid: Not tested   Puree Puree: Within functional limits Presentation: Self Fed;Spoon Other Comments: pt consumed jello, taking small bites without clinical indications of aspiration or dysphagia   Solid   GO    Solid: Not tested Other Comments: pt on clear liquid diet       Luanna Salk, [place] [facility] SLP [phone]

## 2013-08-31 NOTE — Progress Notes (Signed)
Chaplain consulted with nurse. Chaplain provided ministry of presence. Patients daughter at bedside. Chaplain offered pastoral care and support to patient/patients daughter. Chaplain will follow up if needed or requested.   [place], [place].  [phone]  08/31/13 1500  Clinical Encounter Type  Visited With Patient and family together  Visit Type Initial

## 2013-08-31 NOTE — Care Management Note (Signed)
   CARE MANAGEMENT NOTE 08/31/2013  Patient:  MAKYNLIE, ROSSINI   Account Number:  [id number]  Date Initiated:  08/31/2013  Documentation initiated by:  Glendell Fouse  Subjective/Objective Assessment:   63 yo female admitted with HCAP.PCP: [name], MD     Action/Plan:   Home when stable   Anticipated DC Date:     Anticipated DC Plan:  [place]  CM consult      Choice offered to / List presented to:  C-1 Patient   DME arranged  NA      DME agency  NA        Status of service:  In process, will continue to follow Medicare Important Message given?   (If response is "NO", the following Medicare IM given date fields will be blank) Date Medicare IM given:   Date Additional Medicare IM given:    Discharge Disposition:    Per UR Regulation:  Reviewed for med. necessity/level of care/duration of stay  If discussed at [place] of Stay Meetings, dates discussed:    Comments:  08/31/13 [place] Chart reviewed for utilization of services. Cm recommends [facility] services for medication and dx management at time of discharge related to pt frequent readmissons within past 4 months. Will continue to monitor for additional needs, awating PT eval.

## 2013-08-31 NOTE — ED Provider Notes (Signed)
Medical screening examination/treatment/procedure(s) were performed by non-physician practitioner and as supervising physician I was immediately available for consultation/collaboration.   EKG Interpretation None       Virgel Manifold, MD 08/31/13 1454

## 2013-08-31 NOTE — Evaluation (Signed)
Physical Therapy Evaluation Patient Details Name: [name] MRN: [id number] DOB: 1950/12/04 Today's Date: 08/31/2013 Time: 4098-1191 PT Time Calculation (min): 20 min  PT Assessment / Plan / Recommendation History of Present Illness  63 yo female admitted with AMS, acute encephalopathy. Hx of multiple myeloma-currently undergoing chemo/palliative radiotherapy, peripheral neuropathy.   Clinical Impression  On eval, pt required Min guard assist for mobility-able to ambulate ~275 without assistive device, but unsteady. Familiar with pt from last admission. Pt refuses to use assistive device despite unsteadiness/history of falls. Recommend HHPT for balance training.     PT Assessment  Patient needs continued PT services    Follow Up Recommendations  Home health PT    Does the patient have the potential to tolerate intense rehabilitation      Barriers to Discharge        Equipment Recommendations  None recommended by PT (pt has DME but chooses not to use it)    Recommendations for Other Services     Frequency Min 3X/week    Precautions / Restrictions Precautions Precautions: Fall Precaution Comments: history of dizziness Restrictions Weight Bearing Restrictions: No   Pertinent Vitals/Pain bil feet-8/10      Mobility  Bed Mobility Overal bed mobility: Modified Independent Transfers Overall transfer level: Needs assistance Sit to Stand: Supervision Ambulation/Gait Ambulation/Gait assistance: Min guard Ambulation Distance (Feet): 275 Feet Assistive device: None Gait Pattern/deviations: Decreased stride length;Step-through pattern General Gait Details: Pt intermittently held onto objects in environment. Very guarded gait pattern. LE instability noted intermittently. 1 standing rest break midway.     Exercises     PT Diagnosis: Difficulty walking  PT Problem List: Decreased balance;Decreased mobility;Decreased knowledge of use of DME;Pain PT Treatment  Interventions: Gait training;DME instruction;Functional mobility training;Therapeutic activities;Therapeutic exercise;Patient/family education;Balance training     PT Goals(Current goals can be found in the care plan section) Acute Rehab PT Goals Patient Stated Goal: home. walk without walker PT Goal Formulation: With patient/family Time For Goal Achievement: 09/14/13 Potential to Achieve Goals: Good  Visit Information  Last PT Received On: 08/31/13 Assistance Needed: +1 History of Present Illness: 63 yo female admitted with AMS, acute encephalopathy. Hx of multiple myeloma-currently undergoing chemo/palliative radiotherapy, peripheral neuropathy.        Prior [place] expects to be discharged to:: Private residence Living Arrangements: Spouse/significant other Type of Home: House Home Access: Stairs to enter Technical brewer of Steps: 4 Entrance Stairs-Rails: Right Home Layout: Two level;Bed/bath upstairs Alternate Level Stairs-Number of Steps: 1 flight with landing in middle Alternate Level Stairs-Rails: Right Home Equipment: [place] - 2 wheels;Walker - 4 wheels;Cane - single point Additional Comments: pt refuses to use walker, canes depsite encouragement from therapist/family Prior Function Level of Independence: Independent Communication Communication: No difficulties    Cognition  Cognition Arousal/Alertness: Awake/alert Behavior During Therapy: WFL for tasks assessed/performed Overall Cognitive Status: Within Functional Limits for tasks assessed    Extremity/Trunk Assessment Upper Extremity Assessment Upper Extremity Assessment: Overall WFL for tasks assessed RUE Sensation: history of peripheral neuropathy LUE Sensation: history of peripheral neuropathy Lower Extremity Assessment Lower Extremity Assessment: Generalized weakness RLE Deficits / Details: strength at least 4/5 throughout RLE Sensation: history of peripheral  neuropathy LLE Deficits / Details: strength at least 4/5 throughout LLE Sensation: history of peripheral neuropathy Cervical / Trunk Assessment Cervical / Trunk Assessment: Normal   Balance Balance Standing balance support: During functional activity;No upper extremity supported Standing balance-Leahy Scale: Fair  End of Session PT - End of Session  Equipment Utilized During Treatment: Gait belt Activity Tolerance: Patient tolerated treatment well Patient left: in bed;with call bell/phone within reach;with family/visitor present  GP     Weston Anna, MPT Pager: [phone]

## 2013-08-31 NOTE — Progress Notes (Addendum)
[facility] PROGRESS NOTE  [name] [id number] DOB: 1951-01-04 DOA: 08/30/2013 PCP: [name], MD  Brief narrative: 63 -year-old female with past medical history of recurrent multiple myeloma, status post palliative radiotherapy and chemotherapy, failed autologous stem cell transplant, history of CVA, HTN, GERD, recent hospitalization for acute encephalopathy secondary to narcotic overdose who presented to [facility] ED 08/30/2013 with progressively worsening shortness of breath and fatigue past 3 days prior to this admission. Patient reported being on amoxicillin outpatient and has completed antibiotic treatment about 10 days prior to this admission. She initially felt better but then started having shortness of breath and cough productive of whitish sputum. Vital signs were stable in ED. Blood work revealed hemoglobin of 10.5 and potassium of 2.3 which was repleted. Chest x-ray was essentially unremarkable. CT angio chest was negative for pulmonary embolism but it did show nodular airspace disease in right middle lobe and superior segment of right lower lobe suggestive of possible infection. Patient was started on broad-spectrum antibiotics for possible HCAP.  Assessment/Plan:  Principal Problem:   HCAP (healthcare-associated pneumonia) - Continue empiric treatment with vancomycin and cefepime - Strep pneumonia and Legionella are negative - Continue oxygen support with nasal cannula to keep oxygen saturation above 90% - Continue Delsym every 6 hours by mouth when necessary for cough and/or congestion Active Problems:   Dysphagia - Could be related to prior history of radiation therapy, unclear - Will start magic mouthwash with lidocaine and see if there is any improvement - Swallow evaluation - May start a regular diet   HYPOTHYROIDISM - Continue levothyroxine 100 mcg daily   GERD - Continue protonic 40 mg daily   HLD (hyperlipidemia) - Continue Lipitor 40 mg daily    Hypertension - pt on lasix but would hold it considering borderline low BP SBP in 90's   Hypokalemia - Secondary to Lasix - Replete today - Followup BMP in the morning   Multiple myeloma  - Status post palliative radiotherapy to the spine and systemic chemotherapy . Failed autologous stem cell transplant.  - Follows with Dr. Julien Nordmann and Dr. Lisbeth Renshaw. Currently getting systemic chemotherapy every 4 weeks with weekly Decadron.    Cancer related pain - continue MS contin 15 mg PO BID   History of CVA  - Continue statin. - Continue Plavix for secondary stroke prevention  Code Status: full code Family Communication: pt daughter at the bedside  Disposition Plan: home when stable   Leisa Lenz, MD  [facility] Pager [phone]  If 7PM-7AM, please contact night-coverage [email or web address] Password [facility] 08/31/2013, 1:35 PM   LOS: 1 day    HPI/Subjective: Still has intermittent dysphagia , no pain on swallowing  Objective: Filed Vitals:   08/30/13 1645 08/30/13 1719 08/30/13 2126 08/31/13 0541  BP: 123/64 140/85 140/85 90/57  Pulse: 103 109 103 70  Temp: 97.6 F (36.4 C)  97.7 F (36.5 C) 98.2 F (36.8 C)  TempSrc: Oral  Oral Oral  Resp: 20 20 20 20   Height:   5' 4"  (1.626 m)   Weight:   87.635 kg (193 lb 3.2 oz)   SpO2: 100% 100% 100% 100%    Intake/Output Summary (Last 24 hours) at 08/31/13 1335 Last data filed at 08/31/13 0600  Gross per 24 hour  Intake 616.25 ml  Output      0 ml  Net 616.25 ml    Exam:   General:  Pt is alert, follows commands appropriately, not in acute distress  Cardiovascular: Regular rate and rhythm,  S1/S2 appreciated   Respiratory: Clear to auscultation bilaterally, no wheezing, no crackles, no rhonchi  Abdomen: Soft, non tender, non distended, bowel sounds present, no guarding  Extremities: No edema, pulses DP and PT palpable bilaterally  Neuro: Grossly nonfocal  Data Reviewed: Basic Metabolic Panel:  Recent Labs Lab  08/26/13 1139 08/30/13 1827 08/31/13 0345  NA 146* 143 139  K 3.5 3.3* 3.5*  CL  --  108 105  CO2 24  --  22  GLUCOSE 114 103* 127*  BUN 6.6* 9 9  CREATININE 0.8 0.80 0.60  CALCIUM 9.9  --  8.3*   Liver Function Tests:  Recent Labs Lab 08/26/13 1139  AST 16  ALT 17  ALKPHOS 96  BILITOT 0.89  PROT 7.7  ALBUMIN 3.1*   No results found for this basename: LIPASE, AMYLASE,  in the last 168 hours No results found for this basename: AMMONIA,  in the last 168 hours CBC:  Recent Labs Lab 08/26/13 1139 08/30/13 1815 08/30/13 1827  WBC 7.4 4.7  --   NEUTROABS 5.7 3.4  --   HGB 10.0* 9.7* 10.5*  HCT 30.9* 29.8* 31.0*  MCV 100.6 98.7  --   PLT 226 232  --    Cardiac Enzymes: No results found for this basename: CKTOTAL, CKMB, CKMBINDEX, TROPONINI,  in the last 168 hours BNP: No components found with this basename: POCBNP,  CBG:  Recent Labs Lab 08/31/13 0021 08/31/13 0733 08/31/13 1143  GLUCAP 118* 88 103*    Recent Results (from the past 240 hour(s))  CLOSTRIDIUM DIFFICILE BY PCR     Status: None   Collection Time    08/30/13 10:16 PM      Result Value Ref Range Status   C difficile by pcr NEGATIVE  NEGATIVE Final   Comment: Performed at [facility]     Studies: Dg Chest 2 View 08/30/2013  IMPRESSION: No acute cardiopulmonary findings.     Ct Angio Chest Pe W/cm &/or Wo Cm 08/30/2013     IMPRESSION: 1. No evidence of acute pulmonary embolism. 2. Nodular airspace disease in the right middle lobe and superior segment right lower lobe suggests in pulmonary infection or inflammation. 3. Upper lobe bronchiectasis is similar prior.     Scheduled Meds: . atorvastatin  40 mg Oral Daily  . ceFEPime (MAXIPIME) IV  1 g Intravenous Q8H  . clopidogrel  75 mg Oral Daily  . enoxaparin (LOVENOX)   40 mg Subcutaneous Q24H  . furosemide  40 mg Oral BID  . gabapentin  600 mg Oral BID  . insulin aspart  0-15 Units Subcutaneous TID WC  . levothyroxine  100 mcg Oral  QAC breakfast  . magic mouthwash w/lid  5 mL Oral TID  . morphine  15 mg Oral BID  . multivitamin  1 tablet Oral Daily  . pantoprazole  40 mg Oral Daily  . potassium chloride  10 mEq Oral BID  . vancomycin  1,250 mg Intravenous Q12H   Continuous Infusions: . sodium chloride 75 mL/hr at 08/30/13 2147

## 2013-09-01 ENCOUNTER — Other Ambulatory Visit: Payer: Medicare Other

## 2013-09-01 ENCOUNTER — Ambulatory Visit: Payer: Medicare Other | Admitting: Internal Medicine

## 2013-09-01 DIAGNOSIS — Z9484 Stem cells transplant status: Secondary | ICD-10-CM

## 2013-09-01 LAB — GLUCOSE, CAPILLARY
Glucose-Capillary: 83 mg/dL (ref 70–99)
Glucose-Capillary: 99 mg/dL (ref 70–99)
Glucose-Capillary: 107 mg/dL — ABNORMAL HIGH (ref 70–99)
Glucose-Capillary: 88 mg/dL (ref 70–99)

## 2013-09-01 LAB — CBC
HEMATOCRIT: 25.5 % — AB (ref 36.0–46.0)
Hemoglobin: 8.1 g/dL — ABNORMAL LOW (ref 12.0–15.0)
MCH: 31.9 pg (ref 26.0–34.0)
MCHC: 31.8 g/dL (ref 30.0–36.0)
MCV: 100.4 fL — AB (ref 78.0–100.0)
Platelets: 211 10*3/uL (ref 150–400)
RBC: 2.54 MIL/uL — ABNORMAL LOW (ref 3.87–5.11)
RDW: 15.6 % — ABNORMAL HIGH (ref 11.5–15.5)
WBC: 3.8 10*3/uL — ABNORMAL LOW (ref 4.0–10.5)

## 2013-09-01 LAB — BASIC METABOLIC PANEL
BUN: 5 mg/dL — AB (ref 6–23)
CO2: 23 mEq/L (ref 19–32)
CREATININE: 0.65 mg/dL (ref 0.50–1.10)
Calcium: 8 mg/dL — ABNORMAL LOW (ref 8.4–10.5)
Chloride: 105 mEq/L (ref 96–112)
GFR calc Af Amer: >90 mL/min (ref >90)
Glucose, Bld: 109 mg/dL — ABNORMAL HIGH (ref 70–99)
POTASSIUM: 3.6 meq/L — AB (ref 3.7–5.3)
Sodium: 139 mEq/L (ref 137–147)

## 2013-09-01 MED ORDER — FUROSEMIDE 40 MG PO TABS
40.0000 mg | ORAL_TABLET | Freq: Every day | ORAL | Status: DC
  Administered 2013-09-02 – 2013-09-03 (×2): 40 mg via ORAL
  Filled 2013-09-01 (×2): qty 1

## 2013-09-01 MED ORDER — MENTHOL 3 MG MT LOZG
1.0000 | LOZENGE | OROMUCOSAL | Status: DC | PRN
  Administered 2013-09-01: 3 mg via ORAL
  Filled 2013-09-01: qty 9

## 2013-09-01 MED ORDER — LOPERAMIDE HCL 1 MG/5ML PO LIQD
1.0000 mg | ORAL | Status: DC | PRN
  Filled 2013-09-01: qty 5

## 2013-09-01 MED ORDER — BENZONATATE 100 MG PO CAPS
100.0000 mg | ORAL_CAPSULE | Freq: Three times a day (TID) | ORAL | Status: DC | PRN
  Filled 2013-09-01: qty 1

## 2013-09-01 NOTE — Progress Notes (Signed)
[facility] PROGRESS NOTE  [name] [id number] DOB: March 21, 1951 DOA: 08/30/2013 PCP: [name], MD  Brief narrative: 63 -year-old female with past medical history of recurrent multiple myeloma, status post palliative radiotherapy and chemotherapy, failed autologous stem cell transplant, history of CVA, HTN, GERD, recent hospitalization for acute encephalopathy secondary to narcotic overdose who presented to [facility] ED 08/30/2013 with progressively worsening shortness of breath and fatigue past 3 days prior to this admission. Patient reported being on amoxicillin outpatient and has completed antibiotic treatment about 10 days prior to this admission. She initially felt better but then started having shortness of breath and cough productive of whitish sputum. Vital signs were stable in ED. Blood work revealed hemoglobin of 10.5 and potassium of 2.3 which was repleted. Chest x-ray was essentially unremarkable. CT angio chest was negative for pulmonary embolism but it did show nodular airspace disease in right middle lobe and superior segment of right lower lobe suggestive of possible infection. Patient was started on broad-spectrum antibiotics for possible HCAP.   Assessment/Plan:   Principal Problem:  HCAP (healthcare-associated pneumonia)  - Continue empiric treatment with vancomycin and cefepime  - Blood cultures show no growth to date  - Strep pneumonia and Legionella are negative  - Continue oxygen support with nasal cannula to keep oxygen saturation above 90%  - Continue Delsym every 6 hours by mouth when necessary for cough and/or congestion  - we also added tessalon tabs as needed Active Problems:  Dysphagia  - Could be related to prior history of radiation therapy - Continue magic mouthwash with lidocaine  - Per swallow evaluation continue regular diet and  HYPOTHYROIDISM  - Continue levothyroxine 100 mcg daily  GERD  - Continue protonic 40 mg daily  HLD  (hyperlipidemia)  - Continue Lipitor 40 mg daily  Hypertension  - may continue lasix BP 105/62 but change to once a day regimen (from twice a day) Hypokalemia  - Secondary to Lasix  - Being repleted - Followup BMP in the morning  Multiple myeloma  - Status post palliative radiotherapy to the spine and systemic chemotherapy . Failed autologous stem cell transplant.  - Follows with Dr. Julien Nordmann and Dr. Lisbeth Renshaw. Currently getting systemic chemotherapy every 4 weeks with weekly Decadron.  Cancer related pain  - continue MS contin 15 mg PO BID  History of CVA  - Continue statin.  - Continue Plavix for secondary stroke prevention   Code Status: full code  Family Communication: pt daughter at the bedside  Disposition Plan: home when stable   Consultants:  None  Procedures:  None  Antibiotics:  Vanco 08/30/2013 --> Cefepime 08/30/2013 -->   Leisa Lenz, MD  [facility] Pager [phone]  If 7PM-7AM, please contact night-coverage [email or web address] Password [facility] 09/01/2013, 12:27 PM   LOS: 2 days    HPI/Subjective: No acute overnight events.  Objective: Filed Vitals:   08/31/13 0541 08/31/13 1432 08/31/13 2104 09/01/13 0607  BP: 90/57 116/64 113/64 105/62  Pulse: 70 77 71 82  Temp: 98.2 F (36.8 C) 98.3 F (36.8 C) 98.1 F (36.7 C) 98.1 F (36.7 C)  TempSrc: Oral Oral Oral Oral  Resp: _0 Height:      Weight:      SpO2: 100% 100% 100% 100%    Intake/Output Summary (Last 24 hours) at 09/01/13 1227 Last data filed at 09/01/13 0600  Gross per 24 hour  Intake   1800 ml  Output      0 ml  Net  1800 ml    Exam:   General:  Pt is alert, follows commands appropriately, not in acute distress  Cardiovascular: Regular rate and rhythm, S1/S2, no murmurs, no rubs, no gallops  Respiratory: Clear to auscultation bilaterally, no wheezing, no crackles, no rhonchi  Abdomen: Soft, non tender, non distended, bowel sounds present, no guarding  Extremities: No  edema, pulses DP and PT palpable bilaterally  Neuro: Grossly nonfocal  Data Reviewed: Basic Metabolic Panel:  Recent Labs Lab 08/26/13 1139 08/30/13 1827 08/31/13 0345 09/01/13 0403  NA 146* 143 139 139  K 3.5 3.3* 3.5* 3.6*  CL  --  108 105 105  CO2 24  --  22 23  GLUCOSE 114 103* 127* 109*  BUN 6.6* 9 9 5*  CREATININE 0.8 0.80 0.60 0.65  CALCIUM 9.9  --  8.3* 8.0*   Liver Function Tests:  Recent Labs Lab 08/26/13 1139  AST 16  ALT 17  ALKPHOS 96  BILITOT 0.89  PROT 7.7  ALBUMIN 3.1*   No results found for this basename: LIPASE, AMYLASE,  in the last 168 hours No results found for this basename: AMMONIA,  in the last 168 hours CBC:  Recent Labs Lab 08/26/13 1139 08/30/13 1815 08/30/13 1827 09/01/13 0403  WBC 7.4 4.7  --  3.8*  NEUTROABS 5.7 3.4  --   --   HGB 10.0* 9.7* 10.5* 8.1*  HCT 30.9* 29.8* 31.0* 25.5*  MCV 100.6 98.7  --  100.4*  PLT 226 232  --  211   Cardiac Enzymes: No results found for this basename: CKTOTAL, CKMB, CKMBINDEX, TROPONINI,  in the last 168 hours BNP: No components found with this basename: POCBNP,  CBG:  Recent Labs Lab 08/31/13 1143 08/31/13 1656 08/31/13 2151 09/01/13 0739 09/01/13 1156  GLUCAP 103* 99 95 83 99    Recent Results (from the past 240 hour(s))  CULTURE, BLOOD (ROUTINE X 2)     Status: None   Collection Time    08/30/13  9:43 PM      Result Value Ref Range Status   Specimen Description BLOOD LEFT ANTECUBITAL   Final   Value:        BLOOD CULTURE RECEIVED NO GROWTH TO DATE      Performed at [facility]   Report Status PENDING   Incomplete  CULTURE, BLOOD (ROUTINE X 2)     Status: None   Collection Time    08/30/13  9:50 PM      Result Value Ref Range Status   Specimen Description BLOOD RIGHT ANTECUBITAL   Final   Value:        BLOOD CULTURE RECEIVED NO GROWTH TO DATE      Performed at [facility]   Report Status PENDING   Incomplete  CLOSTRIDIUM DIFFICILE BY PCR     Status:  None   Collection Time      Result Value Ref Range Status   C difficile by pcr NEGATIVE  NEGATIVE Final   Comment: Performed at [facility]     Studies: Dg Chest 2 View 08/30/2013  IMPRESSION: No acute cardiopulmonary findings.   Electronically Signed   By: Misty Stanley M.D.   On: 08/30/2013 17:52   Ct Angio Chest Pe W/cm &/or Wo Cm 08/30/2013    IMPRESSION: 1. No evidence of acute pulmonary embolism. 2. Nodular airspace disease in the right middle lobe and superior segment right lower lobe suggests in pulmonary infection or inflammation. 3. Upper lobe  bronchiectasis is similar prior.   Electronically Signed   By: Suzy Bouchard M.D.   On: 08/30/2013 19:24    Scheduled Meds: . atorvastatin  40 mg Oral Daily  . ceFEPime (MAXIPIME) IV  1 g Intravenous Q8H  . clopidogrel  75 mg Oral Daily  . enoxaparin (LOVENOX)   40 mg Subcutaneous Q24H  . furosemide  40 mg Oral BID  . gabapentin  600 mg Oral BID  . insulin aspart  0-15 Units Subcutaneous TID WC  . levothyroxine  100 mcg Oral QAC breakfast  . magic mouthwash w/lid  5 mL Oral TID  . morphine  15 mg Oral BID  . multivitamin   1 tablet Oral Daily  . pantoprazole  40 mg Oral Daily  . potassium chloride  10 mEq Oral BID  . vancomycin  1,250 mg Intravenous Q12H   Continuous Infusions: . sodium chloride 75 mL/hr at 09/01/13 [phone]
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
## 2013-09-01 NOTE — Care Management Note (Addendum)
Cm spoke with patient at bedside with adult daughter present concerning discharge planning. Pt recommendations for HHPT. Per pt choice [facility] to provide [facility] services upon discharge. [facility] rep [employer] notified. Per pt has [facility] and RW for home DME use. Pt resides alone. Pt and daughter provided with private duty care list to assist with additional home care needs. Awaiting MD orders for Speech/PT/RN/Aide.    Venita Lick Briann Sarchet,MSN,RN [phone]

## 2013-09-02 DIAGNOSIS — K219 Gastro-esophageal reflux disease without esophagitis: Secondary | ICD-10-CM

## 2013-09-02 LAB — CBC
HEMATOCRIT: 27.6 % — AB (ref 36.0–46.0)
Hemoglobin: 9.1 g/dL — ABNORMAL LOW (ref 12.0–15.0)
MCH: 32.3 pg (ref 26.0–34.0)
MCHC: 33 g/dL (ref 30.0–36.0)
MCV: 97.9 fL (ref 78.0–100.0)
PLATELETS: 285 10*3/uL (ref 150–400)
RBC: 2.82 MIL/uL — AB (ref 3.87–5.11)
RDW: 15.6 % — ABNORMAL HIGH (ref 11.5–15.5)
WBC: 5.4 10*3/uL (ref 4.0–10.5)

## 2013-09-02 LAB — GLUCOSE, CAPILLARY
GLUCOSE-CAPILLARY: 103 mg/dL — AB (ref 70–99)
GLUCOSE-CAPILLARY: 81 mg/dL (ref 70–99)
GLUCOSE-CAPILLARY: 100 mg/dL — AB (ref 70–99)
Glucose-Capillary: 79 mg/dL (ref 70–99)

## 2013-09-02 LAB — BASIC METABOLIC PANEL
BUN: 3 mg/dL — AB (ref 6–23)
CHLORIDE: 105 meq/L (ref 96–112)
CO2: 23 mEq/L (ref 19–32)
Calcium: 9.1 mg/dL (ref 8.4–10.5)
Creatinine, Ser: 0.59 mg/dL (ref 0.50–1.10)
GFR calc non Af Amer: >90 mL/min (ref >90)
Glucose, Bld: 110 mg/dL — ABNORMAL HIGH (ref 70–99)
POTASSIUM: 3.6 meq/L — AB (ref 3.7–5.3)
Sodium: 141 mEq/L (ref 137–147)

## 2013-09-02 NOTE — Progress Notes (Signed)
Physical Therapy Treatment Patient Details Name: [name] MRN: [id number] DOB: 02-29-1951 Today's Date: 09/02/2013 Time: 8768-1157 PT Time Calculation (min): 16 min  PT Assessment / Plan / Recommendation  History of Present Illness 63 yo female admitted with AMS, acute encephalopathy. Hx of multiple myeloma-currently undergoing chemo/palliative radiotherapy, peripheral neuropathy.    PT Comments   Pt tolerated well. Continues  To have  decreased balance but declines to use RW.  Follow Up Recommendations  Home health PT     Does the patient have the potential to tolerate intense rehabilitation     Barriers to Discharge        Equipment Recommendations  None recommended by PT    Recommendations for Other Services    Frequency Min 3X/week   Progress towards PT Goals Progress towards PT goals: Progressing toward goals  Plan Current plan remains appropriate    Precautions / Restrictions Precautions Precautions: Fall Precaution Comments: history of dizziness   Pertinent Vitals/Pain     Mobility  Bed Mobility Overal bed mobility: Independent Transfers Equipment used:  (IV pole, declined RW) Transfers: Sit to/from Stand Sit to Stand: Supervision Ambulation/Gait Ambulation/Gait assistance: Min guard Ambulation Distance (Feet): 140 Feet (x2) Gait Pattern/deviations: Decreased step length - right;Staggering left;Staggering right General Gait Details: Pt intermittently held onto objects in environment. Very guarded gait pattern. LE instability noted intermittently. 1 sitting rest break midway.     Exercises General Exercises - Lower Extremity Ankle Circles/Pumps: AROM;Both;10 reps;Seated Long Arc Quad: AROM;Both;Seated Hip ABduction/ADduction: AROM;Both;10 reps;Seated Hip Flexion/Marching: AROM;Both;10 reps;Seated Toe Raises: AROM;Both;10 reps;Seated Heel Raises: AROM;Both;10 reps;Seated   PT Diagnosis:    PT Problem List:   PT Treatment Interventions:     PT  Goals (current goals can now be found in the care plan section)    Visit Information  Last PT Received On: 09/02/13 Assistance Needed: +1 History of Present Illness: 63 yo female admitted with AMS, acute encephalopathy. Hx of multiple myeloma-currently undergoing chemo/palliative radiotherapy, peripheral neuropathy.     Subjective Data      Cognition  Cognition Arousal/Alertness: Awake/alert    Balance  Balance Standing balance support: During functional activity;No upper extremity supported Standing balance-Leahy Scale: Fair  End of Session PT - End of Session Activity Tolerance: Patient tolerated treatment well Patient left: in bed;with call bell/phone within reach   GP     [name] 09/02/2013, 4:55 PM

## 2013-09-02 NOTE — Progress Notes (Signed)
[facility] PROGRESS NOTE  [name] ZOX:[id number] DOB: 08-08-1950 DOA: 08/30/2013 PCP: [name], MD  Brief narrative: 63 -year-old female with past medical history of recurrent multiple myeloma, status post palliative radiotherapy and chemotherapy, failed autologous stem cell transplant, history of CVA, HTN, GERD, recent hospitalization for acute encephalopathy secondary to narcotic overdose who presented to [facility] ED 08/30/2013 with progressively worsening shortness of breath and fatigue past 3 days prior to this admission. Patient reported being on amoxicillin outpatient and has completed antibiotic treatment about 10 days prior to this admission. She initially felt better but then started having shortness of breath and cough productive of whitish sputum. Vital signs were stable in ED. Blood work revealed hemoglobin of 10.5 and potassium of 2.3 which was repleted. Chest x-ray was essentially unremarkable. CT angio chest was negative for pulmonary embolism but it did show nodular airspace disease in right middle lobe and superior segment of right lower lobe suggestive of possible infection. Patient was started on broad-spectrum antibiotics for possible HCAP.   Assessment/Plan:   Principal Problem:  HCAP (healthcare-associated pneumonia)  - Continue empiric treatment with vancomycin and cefepime; will transition to PO tomorrow when pt ready for discharge  - Blood cultures show no growth to date  - Strep pneumonia and Legionella are negative  - did not use oxygen as O2 sats 100% - Continue tessalon pearls and Delsym when necessary for cough and/or congestion   Active Problems:  Dysphagia  - Could be related to prior history of radiation therapy  - Continue magic mouthwash with lidocaine  - feels better HYPOTHYROIDISM  - Continue levothyroxine 100 mcg daily  GERD  - Continue protonic 40 mg daily  HLD (hyperlipidemia)  - Continue Lipitor 40 mg daily  Hypertension  -  continue lasix 40 mg daily  Hypokalemia  - Secondary to Lasix  - repleted  Multiple myeloma  - Status post palliative radiotherapy to the spine and systemic chemotherapy . Failed autologous stem cell transplant.  - Follows with Dr. Julien Nordmann and Dr. Lisbeth Renshaw. Currently getting systemic chemotherapy every 4 weeks with weekly Decadron.  Cancer related pain  - continue MS contin 15 mg PO BID  History of CVA  - Continue statin.  - Continue Plavix for secondary stroke prevention   Code Status: full code  Family Communication: pt daughter at the bedside  Disposition Plan: home when stable likely in next 24 hours   Consultants:  None  Procedures:  None  Antibiotics:  Vanco 08/30/2013 -->  Cefepime 08/30/2013 -->  Leisa Lenz, MD  [facility] Pager [phone]  If 7PM-7AM, please contact night-coverage [email or web address] Password [email or web address] 09/02/2013, 2:45 PM   LOS: 3 days    HPI/Subjective: No acute overnight events.   Objective: Filed Vitals:   09/01/13 1454 09/01/13 2011 09/02/13 0555 09/02/13 0622  BP: 112/67 118/63 95/54 112/60  Pulse: 81 73 75   Temp: 98.3 F (36.8 C) 98.3 F (36.8 C) 98.1 F (36.7 C)   TempSrc: Oral Oral Oral   Resp: 16 16 16    Height:      Weight:      SpO2: 100% 100% 100%     Intake/Output Summary (Last 24 hours) at 09/02/13 1445 Last data filed at 09/02/13 1014  Gross per 24 hour  Intake    360 ml  Output      0 ml  Net    360 ml    Exam:   General:  Pt is alert, follows commands appropriately, not in acute  distress  Cardiovascular: Regular rate and rhythm, S1/S2, no murmurs, no rubs, no gallops  Respiratory: Clear to auscultation bilaterally, no wheezing, no crackles, no rhonchi  Abdomen: Soft, non tender, non distended, bowel sounds present, no guarding  Extremities: No edema, pulses DP and PT palpable bilaterally  Neuro: Grossly nonfocal  Data Reviewed: Basic Metabolic Panel:  Recent Labs Lab 08/30/13 1827 08/31/13 0345  09/01/13 0403 09/02/13 1250  NA 143 139 139 141  K 3.3* 3.5* 3.6* 3.6*  CL 108 105 105 105  CO2  --  22 23 23   GLUCOSE 103* 127* 109* 110*  BUN 9 9 5* 3*  CREATININE 0.80 0.60 0.65 0.59  CALCIUM  --  8.3* 8.0* 9.1   Liver Function Tests: No results found for this basename: AST, ALT, ALKPHOS, BILITOT, PROT, ALBUMIN,  in the last 168 hours No results found for this basename: LIPASE, AMYLASE,  in the last 168 hours No results found for this basename: AMMONIA,  in the last 168 hours CBC:  Recent Labs Lab 08/30/13 1815 08/30/13 1827 09/01/13 0403 09/02/13 1250  WBC 4.7  --  3.8* 5.4  NEUTROABS 3.4  --   --   --   HGB 9.7* 10.5* 8.1* 9.1*  HCT 29.8* 31.0* 25.5* 27.6*  MCV 98.7  --  100.4* 97.9  PLT 232  --  211 285   Cardiac Enzymes: No results found for this basename: CKTOTAL, CKMB, CKMBINDEX, TROPONINI,  in the last 168 hours BNP: No components found with this basename: POCBNP,  CBG:  Recent Labs Lab 09/01/13 1156 09/01/13 1726 09/01/13 2218 09/02/13 0752 09/02/13 1200  GLUCAP 99 107* 88 79 103*    CULTURE, BLOOD (ROUTINE X 2)     Status: None   Collection Time    08/30/13  9:43 PM      Result Value Ref Range Status   Specimen Description BLOOD LEFT ANTECUBITAL   Final   Value:        BLOOD CULTURE RECEIVED NO GROWTH TO DATE      Performed at [facility]   Report Status PENDING   Incomplete  CULTURE, BLOOD (ROUTINE X 2)     Status: None   Collection Time    08/30/13  9:50 PM      Result Value Ref Range Status   Specimen Description BLOOD RIGHT ANTECUBITAL   Final   Value:        BLOOD CULTURE RECEIVED NO GROWTH TO DATE     Performed at [facility]   Report Status PENDING   Incomplete  CLOSTRIDIUM DIFFICILE BY PCR     Status: None   Collection Time    08/30/13 10:16 PM      Result Value Ref Range Status   C difficile by pcr NEGATIVE  NEGATIVE Final   Comment: Performed at [facility]     Studies: No results  found.  Scheduled Meds: . atorvastatin  40 mg Oral Daily  . ceFEPime (MAXIPIME) IV  1 g Intravenous Q8H  . clopidogrel  75 mg Oral Daily  . enoxaparin (LOVENOX)  40 mg Subcutaneous Q24H  . furosemide  40 mg Oral Daily  . gabapentin  600 mg Oral BID  . insulin aspart  0-15 Units Subcutaneous TID WC  . levothyroxine  100 mcg Oral QAC breakfast  . magic mouthwash w/lid  5 mL Oral TID  . morphine  15 mg Oral BID  . multivitamin  1 tablet Oral Daily  .  pantoprazole  40 mg Oral Daily  . potassium chloride  10 mEq Oral BID  . vancomycin  1,250 mg Intravenous Q12H   Continuous Infusions: . sodium chloride 75 mL/hr at 09/02/13 0148

## 2013-09-02 NOTE — Progress Notes (Addendum)
ANTIBIOTIC CONSULT NOTE - INITIAL  Pharmacy Consult for Vancomycin and Cefepime Indication: HCAP  No Known Allergies  Patient Measurements: Height: 5\' 4"  (162.6 cm) Weight: 193 lb 3.2 oz (87.635 kg) IBW/kg (Calculated) : 54.7  Vital Signs: Temp: 98.1 F (36.7 C) (03/05 0555) Temp src: Oral (03/05 0555) BP: 112/60 mmHg (03/05 0622) Pulse Rate: 75 (03/05 0555) Intake/Output from previous day: 03/04 0701 - 03/05 0700 In: 120 [P.O.:120] Out: -  Intake/Output from this shift: Total I/O In: 240 [P.O.:240] Out: -   Labs:  Recent Labs  08/30/13 1815 08/30/13 1827 08/31/13 0345 09/01/13 0403  WBC 4.7  --   --  3.8*  HGB 9.7* 10.5*  --  8.1*  PLT 232  --   --  211  CREATININE  --  0.80 0.60 0.65   CrCl 82 ml/min/1.29m2 (normalized) Estimated Creatinine Clearance: 78.2 ml/min (by C-G formula based on Cr of 0.65). No results found for this basename: VANCOTROUGH, VANCOPEAK, VANCORANDOM, [place], GENTPEAK, GENTRANDOM, TOBRATROUGH, TOBRAPEAK, TOBRARND, AMIKACINPEAK, AMIKACINTROU, AMIKACIN,  in the last 72 hours   Microbiology: Recent Results (from the past 720 hour(s))  CULTURE, BLOOD (ROUTINE X 2)     Status: None   Collection Time    08/30/13  9:43 PM      Result Value Ref Range Status   Specimen Description BLOOD LEFT ANTECUBITAL   Final   Special Requests BOTTLES DRAWN AEROBIC AND ANAEROBIC 5CC   Final   Culture  Setup Time     Final   Value: 08/31/2013 01:03     Performed at [facility]   Culture     Final   Value:        BLOOD CULTURE RECEIVED NO GROWTH TO DATE CULTURE WILL BE HELD FOR 5 DAYS BEFORE ISSUING A FINAL NEGATIVE REPORT     Performed at [facility]   Report Status PENDING   Incomplete  CULTURE, BLOOD (ROUTINE X 2)     Status: None   Collection Time    08/30/13  9:50 PM      Result Value Ref Range Status   Specimen Description BLOOD RIGHT ANTECUBITAL   Final   Special Requests BOTTLES DRAWN AEROBIC ONLY 4CC   Final   Culture   Setup Time     Final   Value: 08/31/2013 01:03     Performed at [facility]   Culture     Final   Value:        BLOOD CULTURE RECEIVED NO GROWTH TO DATE CULTURE WILL BE HELD FOR 5 DAYS BEFORE ISSUING A FINAL NEGATIVE REPORT     Performed at [facility]   Report Status PENDING   Incomplete  CLOSTRIDIUM DIFFICILE BY PCR     Status: None   Collection Time    08/30/13 10:16 PM      Result Value Ref Range Status   C difficile by pcr NEGATIVE  NEGATIVE Final   Comment: Performed at [facility] History: Past Medical History  Diagnosis Date  . Thyroid disease Hypothyroidism  . Hypercholesterolemia   . Compression fracture 04/08/2007    pathologic compression fracture  . Hypothyroidism   . FHx: chemotherapy     s/p 5 cycle revlimid/low dose decadron,s/p velcade,doxil,decadron,  . Hx of radiation therapy 05/05/07-05/18/07,& 03/05/11-03/21/11-    l3&l5 in 2008, t2-t6 03/2011  . GERD (gastroesophageal reflux disease)   . Insomnia     associated with steroids  . Nausea   .  Constipation     takes oxycontin,vicodin  . Hx of radiation therapy 05/05/2007 to 05/18/2007    palliative, L3-5  . 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 Hx of radiation therapy 03/05/2011 to 03/21/2011    palliative T2-T6, c-spine  . 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 PONV (postoperative nausea and vomiting)   . Metastasis to bone    Assessment: 32 yof with MM s/p palliative chemo and radiation. Reports she has had recurrent pneumonia for the past 4 weeks which she has been on 3 separate antibiotics. Chest CTA 3/2 is without acute PE however there is evidence of focal infiltrate concerning for pneumonia versus inflammation. Given recent hospitalization, treat as HCAP.  3/2 >> Cefepime >> 3/2 >> vancomycin >>  Tmax: remains afebrile WBC: 5.4 Renal: SCr 0.59- stable, CrCl 78 ml/min CG, 82N (using SCr=0.8), UOP not accurately charted  3/2  blood x2: ngtd 3/2 C.diff PCR (-) 3/2 S. pneumo Ur Ag (-) 3/2 Legionella Ur Ag (-) 3/2 HIV Ab: non reactive  Goal of Therapy:  Vancomycin trough level 15-20 mcg/ml Cefepime dose per renal function  Plan:   Continue vancomycin 1250mg  IV q12h--consider trough tomorrow if patient to continue on vanc Cefepime 1g IV q8h Follow up renal function & cultures   Dicky Doe, PharmD, BCPS Clinical Pharmacist Pager: [phone] 09/02/2013 1:16 PM

## 2013-09-03 LAB — GLUCOSE, CAPILLARY
GLUCOSE-CAPILLARY: 99 mg/dL (ref 70–99)
Glucose-Capillary: 88 mg/dL (ref 70–99)

## 2013-09-03 MED ORDER — HEPARIN SOD (PORK) LOCK FLUSH 100 UNIT/ML IV SOLN
500.0000 [IU] | INTRAVENOUS | Status: DC | PRN
  Filled 2013-09-03: qty 5

## 2013-09-03 MED ORDER — ONDANSETRON 4 MG PO TBDP: 4.0000 mg | ORAL_TABLET | Freq: Three times a day (TID) | ORAL | Status: DC | PRN

## 2013-09-03 MED ORDER — CLOPIDOGREL BISULFATE 75 MG PO TABS: 75.0000 mg | ORAL_TABLET | Freq: Every day | ORAL | Status: DC

## 2013-09-03 MED ORDER — MAGIC MOUTHWASH W/LIDOCAINE: 5.0000 mL | Freq: Three times a day (TID) | ORAL | Status: DC

## 2013-09-03 MED ORDER — LEVOFLOXACIN 750 MG PO TABS: 750.0000 mg | ORAL_TABLET | Freq: Every day | ORAL | Status: DC

## 2013-09-03 MED ORDER — ALPRAZOLAM 0.5 MG PO TABS: 0.5000 mg | ORAL_TABLET | Freq: Three times a day (TID) | ORAL | Status: DC | PRN

## 2013-09-03 MED ORDER — HYDROCODONE-ACETAMINOPHEN 7.5-325 MG PO TABS
1.0000 | ORAL_TABLET | Freq: Four times a day (QID) | ORAL | Status: DC | PRN
Start: 2013-09-03 — End: 2013-10-15

## 2013-09-03 MED ORDER — FUROSEMIDE 40 MG PO TABS: 40.0000 mg | ORAL_TABLET | Freq: Every day | ORAL | Status: DC

## 2013-09-03 MED ORDER — BENZONATATE 100 MG PO CAPS: 100.0000 mg | ORAL_CAPSULE | Freq: Three times a day (TID) | ORAL | Status: DC | PRN

## 2013-09-03 NOTE — Care Management Note (Signed)
Follow up appt scheduled for pt on 09/09/13 with [facility] [name] @ 1:30pm.    Venita Lick Macel Yearsley,MSN,RN [phone]

## 2013-09-03 NOTE — Progress Notes (Signed)
Patient was stable at discharge. Heparin and saline flushed port before deaccessing it. Patient verbalized understanding of discharge education. She left with her prescriptions

## 2013-09-03 NOTE — Discharge Summary (Signed)
Physician Discharge Summary  [name] LFY:[id number] DOB: January 25, 1951 DOA: 08/30/2013  PCP: [name], MD  Admit date: 08/30/2013 Discharge date: 09/03/2013  Recommendations for Outpatient Follow-up:  1. Please continue Levaquin 750 mg daily for 10 days for treatment of healthcare acquired pneumonia. This would complete a total of 2 weeks of antibiotic treatment. 2. Please continue current home medications with the change in Lasix. Lasix was 40 mg twice daily but we changed it to once daily regimen due to relatively stable BP but was on lower side. At the time of discharge your BP is 117/59. 3. You were cared for by Dr. Leisa Lenz (a hospitalist) during your hospital stay. If you have any questions about your discharge medications or the care you received while you were in the hospital after you are discharged, you can call the unit at [phone] and ask to speak with the hospitalist on call if the hospitalist that took care of you is not available.  Discharge Diagnoses:  Principal Problem:   HCAP (healthcare-associated pneumonia) Active Problems:   MULTIPLE  MYELOMA   HYPOTHYROIDISM   GERD   HYPERTENSION, HX OF   HLD (hyperlipidemia)   Hypokalemia    Discharge Condition: medically stable for discharge home today with [place] orders in place  Diet recommendation: diet as tolerated  History of present illness:  63 -year-old female with past medical history of recurrent multiple myeloma, status post palliative radiotherapy and chemotherapy, failed autologous stem cell transplant, history of CVA, HTN, GERD, recent hospitalization for acute encephalopathy secondary to narcotic overdose who presented to [facility] 08/30/2013 with progressively worsening shortness of breath and fatigue past 3 days prior to this admission. Patient reported being on amoxicillin outpatient and has completed antibiotic treatment about 10 days prior to this admission. She initially felt better but then started  having shortness of breath and cough productive of whitish sputum. Vital signs were stable in ED. Blood work revealed hemoglobin of 10.5 and potassium of 2.3 which was repleted. Chest x-ray was essentially unremarkable. CT angio chest was negative for pulmonary embolism but it did show nodular airspace disease in right middle lobe and superior segment of right lower lobe suggestive of possible infection. Patient was started on broad-spectrum antibiotics for possible HCAP.   Assessment/Plan:   Principal Problem:  HCAP (healthcare-associated pneumonia)  - Continue empiric treatment with vancomycin and cefepime until the discharge and then will switch to levaquin for 10 days once discharged   - Blood cultures show no growth to date  - Strep pneumonia and Legionella are negative  - did not use oxygen as O2 sats 100%  - Continue tessalon pearls and Delsym when necessary for cough and/or congestion; prescription provided on discharge  Active Problems:  Dysphagia  - Could be related to prior history of radiation therapy  - Continue magic mouthwash with lidocaine  - regular diet tolerated with no difficulty  HYPOTHYROIDISM  - Continue levothyroxine 100 mcg daily  GERD  - Continue protonic 40 mg daily  HLD (hyperlipidemia)  - Continue Lipitor 40 mg daily  Hypertension  - continue lasix 40 mg daily  Hypokalemia  - Secondary to Lasix  - repleted  Multiple myeloma  - Status post palliative radiotherapy to the spine and systemic chemotherapy . Failed autologous stem cell transplant.  - Follows with Dr. Julien Nordmann and Dr. Lisbeth Renshaw. Currently getting systemic chemotherapy every 4 weeks with weekly Decadron.  Cancer related pain  - continue MS contin 15 mg PO BID  History  of CVA  - Continue statin.  - Continue Plavix for secondary stroke prevention   Code Status: full code  Family Communication: pt daughter at the bedside   Consultants:  None  Procedures:  None  Antibiotics:  Vanco 08/30/2013 -->   Cefepime 08/30/2013 -->  Signed:  Leisa Lenz, MD  [facility] 09/03/2013, 11:41 AM  Pager #: [phone]   Discharge Exam: Filed Vitals:   09/03/13 0555  BP: 117/59  Pulse: 73  Temp: 98.1 F (36.7 C)  Resp: 16   Filed Vitals:   09/02/13 0622 09/02/13 1400 09/02/13 2130 09/03/13 0555  BP: 112/60 117/65 134/63 117/59  Pulse:  72 78 73  Temp:  98.2 F (36.8 C)  98.1 F (36.7 C)  TempSrc:  Oral Oral Oral  Resp:  16 16 16   Height:      Weight:      SpO2:  100% 100% 99%    General: Pt is alert, follows commands appropriately, not in acute distress Cardiovascular: Regular rate and rhythm, S1/S2 +, no murmurs, no rubs, no gallops Respiratory: Clear to auscultation bilaterally, no wheezing, no crackles, no rhonchi Abdominal: Soft, non tender, non distended, bowel sounds +, no guarding Extremities: no edema, no cyanosis, pulses palpable bilaterally DP and PT Neuro: Grossly nonfocal  Discharge Instructions  Discharge Orders   Future Appointments Provider Department Dept Phone   09/23/2013 3:15 PM Marye Round, [facility] [phone]   10/19/2013 12:30 PM [facility] [phone]   10/19/2013 1:00 PM [facility] [phone]   10/26/2013 10:45 AM Curt Bears, MD [facility] [phone]   11/04/2013 9:30 AM Unk Pinto, MD [facility] [phone]   07/25/2014 2:00 PM Ardis Hughs, [name] [facility] [phone]   Future Orders Complete By Expires   Call MD for:  difficulty breathing, headache or visual disturbances  As directed    Call MD for:  persistant dizziness or light-headedness  As directed    Call MD for:  persistant nausea and vomiting  As directed    Call MD for:  severe uncontrolled pain  As directed    Diet - low  sodium heart healthy  As directed    Discharge instructions  As directed    Comments:     1. Please continue Levaquin 750 mg daily for 10 days for treatment of healthcare acquired pneumonia. This would complete a total of 2 weeks of antibiotic treatment. 2. Please continue current home medications with the change in Lasix. Lasix was 40 mg twice daily but we changed it to once daily regimen due to relatively stable BP but was on lower side. At the time of discharge your BP is 117/59. 3. You were cared for by Dr. Leisa Lenz (a hospitalist) during your hospital stay. If you have any questions about your discharge medications or the care you received while you were in the hospital after you are discharged, you can call the unit at [phone] and ask to speak with the hospitalist on call if the hospitalist that took care of you is not available.   Increase activity slowly  As directed        Medication List    STOP taking these medications       promethazine-dextromethorphan 6.25-15 MG/5ML syrup  Commonly known as:  PROMETHAZINE-DM      TAKE these medications  ALPRAZolam 0.5 MG tablet  Commonly known as:  XANAX  Take 1 tablet (0.5 mg total) by mouth 3 (three) times daily as needed for anxiety.     atorvastatin 40 MG tablet  Commonly known as:  LIPITOR  Take 1 tablet (40 mg total) by mouth daily.     benzonatate 100 MG capsule  Commonly known as:  TESSALON  Take 1 capsule (100 mg total) by mouth 3 (three) times daily as needed for cough.     clopidogrel 75 MG tablet  Commonly known as:  PLAVIX  Take 1 tablet (75 mg total) by mouth daily.     dexamethasone 4 MG tablet  Commonly known as:  DECADRON  TAKE 10 TABLETS BY MOUTH ON A WEEKLY BASIS STARTING WITH THE FIRST CYCLE OF CHEMOTHERAPY.     dexlansoprazole 60 MG capsule  Commonly known as:  DEXILANT  Take 1 capsule (60 mg total) by mouth daily.     eszopiclone 3 MG Tabs  Generic drug:  Eszopiclone  Take 3 mg by mouth at  bedtime.     furosemide 40 MG tablet  Commonly known as:  LASIX  Take 1 tablet (40 mg total) by mouth daily.     gabapentin 600 MG tablet  Commonly known as:  NEURONTIN  Take 600 mg by mouth 2 (two) times daily.     HYDROcodone-acetaminophen 7.5-325 MG per tablet  Commonly known as:  NORCO  Take 1 tablet by mouth every 6 (six) hours as needed (PAIN).     levofloxacin 750 MG tablet  Commonly known as:  LEVAQUIN  Take 1 tablet (750 mg total) by mouth daily.     levothyroxine 100 MCG tablet  Commonly known as:  SYNTHROID, LEVOTHROID  Take 100 mcg by mouth daily.     lidocaine-prilocaine cream  Commonly known as:  EMLA  APPLY TO PORT 1 TO 2 HRS PRIOR TO PROCEDURE     loperamide 2 MG capsule  Commonly known as:  IMODIUM  Take 2 mg by mouth 4 (four) times daily as needed for diarrhea or loose stools.     magic mouthwash w/lidocaine Soln  Take 5 mLs by mouth 3 (three) times daily.     morphine 15 MG 12 hr tablet  Commonly known as:  MS CONTIN  Take 1 tablet (15 mg total) by mouth 2 (two) times daily.     multivitamin with minerals Tabs tablet  Take 1 tablet by mouth daily.     ondansetron 4 MG disintegrating tablet  Commonly known as:  ZOFRAN-ODT  Take 1 tablet (4 mg total) by mouth every 8 (eight) hours as needed for nausea.     potassium chloride 10 MEQ tablet  Commonly known as:  K-DUR,KLOR-CON  Take 1 tablet (10 mEq total) by mouth 2 (two) times daily.     PRESCRIPTION MEDICATION  carfilzomib (KYPROLIS) 56 mg in dextrose 5 % 50 mL chemo infusion 27 mg/m2  2.07 m2 (Treatment Plan Actual) Once 08/17/2013           Follow-up Information   Follow up with [facility]. (Physical therapy, Speech Therapy)    Contact information:   [address] [place]  [phone]        The results of significant diagnostics from this hospitalization (including imaging, microbiology, ancillary and laboratory) are listed below for  reference.    Significant Diagnostic Studies: Dg Chest 2 View  08/30/2013   CLINICAL DATA:  Shortness of breath and  cough. History of multiple myeloma.  EXAM: CHEST  2 VIEW  COMPARISON:  08/09/2013  FINDINGS: The lungs are clear without focal infiltrate, edema, pneumothorax or pleural effusion. The cardiopericardial silhouette is within normal limits for size. Right Port-A-Cath tip projects at the SVC/ RA junction. Telemetry leads overlie the chest.  IMPRESSION: No acute cardiopulmonary findings.   Electronically Signed   By: Misty Stanley M.D.   On: 08/30/2013 17:52   Dg Chest 2 View  08/09/2013   CLINICAL DATA:  Altered mental status.  EXAM: CHEST  2 VIEW  COMPARISON:  DG RIBS UNILATERAL*R* dated 08/03/2013; DG CHEST 2 VIEW dated 08/03/2013  FINDINGS: Low lung volumes. Right-sided central venous catheter is appreciated with tip projected regions superior vena caval right atrial junction. Lungs are clear. Cardiac silhouette is enlarged. Osseous structures unremarkable.  IMPRESSION: No evidence of acute cardiopulmonary disease.   Electronically Signed   By: Margaree Mackintosh M.D.   On: 08/09/2013 21:14   Dg Shoulder Right  08/08/2013   CLINICAL DATA:  Status post fall with shoulder pain  EXAM: RIGHT SHOULDER - 2+ VIEW  COMPARISON:  None.  FINDINGS: There is no evidence of fracture or dislocation. Mild degenerative joint changes of right acromioclavicular joint is noted. Soft tissues are unremarkable.  IMPRESSION: No acute fracture or dislocation   Electronically Signed   By: Abelardo Diesel M.D.   On: 08/08/2013 19:02   Ct Head Wo Contrast  08/09/2013   CLINICAL DATA:  Altered mental status.  EXAM: CT HEAD WITHOUT CONTRAST  TECHNIQUE: Contiguous axial images were obtained from the base of the skull through the vertex without intravenous contrast.  COMPARISON:  CT scan of February 24, 2013.  FINDINGS: Lytic lesions are again noted in the skull bilaterally with the largest seen in the right posterior parietal region.  These are unchanged compared to prior exam and are most consistent with the history of multiple myeloma.  No mass effect or midline shift is noted. Ventricular size is within normal limits. There is no evidence of mass lesion, hemorrhage or acute infarction.  IMPRESSION: Stable lytic lesions involving the calvarium compared to prior exam and consistent with a history of multiple myeloma. No acute intracranial abnormality is noted.   Electronically Signed   By: Sabino Dick M.D.   On: 08/09/2013 21:14   Ct Angio Chest Pe W/cm &/or Wo Cm  08/30/2013   CLINICAL DATA:  Multiple myeloma, shoulder pain, concern for pulmonary embolism. Marland Kitchen  EXAM: CT ANGIOGRAPHY CHEST WITH CONTRAST  TECHNIQUE: Multidetector CT imaging of the chest was performed using the standard protocol during bolus administration of intravenous contrast. Multiplanar CT image reconstructions and MIPs were obtained to evaluate the vascular anatomy.  CONTRAST:  146m OMNIPAQUE IOHEXOL 350 MG/ML SOLN  COMPARISON:  DG CHEST 2 VIEW dated 08/30/2013; CT ANGIO CHEST W/CM &/OR WO/CM dated 05/01/2013; CT ANGIO CHEST W/CM &/OR WO/CM dated 05/31/2012  FINDINGS: There are no filling defects within the pulmonary arteries to suggest pulmonary embolism. No acute findings aorta great vessels. No pericardial fluid. Esophagus normal.  No axillary supraclavicular adenopathy. Port in the right anterior chest wall. No mediastinal hilar lymphadenopathy. No pericardial fluid  Review of the lung parenchyma demonstrates mild bronchiectasis in the upper lobes. There is scattered nodular airspace disease within the right lung. There is some mild consolidation in the medial segment right middle lobe (image 54). There is a branching nodular pattern in the superior segment of the right lower lobe (image 43).  Limited view  of the upper abdomen is unremarkable.  Review of the MIP images confirms the above findings.  IMPRESSION: 1. No evidence of acute pulmonary embolism. 2. Nodular airspace  disease in the right middle lobe and superior segment right lower lobe suggests in pulmonary infection or inflammation. 3. Upper lobe bronchiectasis is similar prior.   Electronically Signed   By: Suzy Bouchard M.D.   On: 08/30/2013 19:24    Microbiology: Recent Results (from the past 240 hour(s))  CULTURE, BLOOD (ROUTINE X 2)     Status: None   Collection Time    08/30/13  9:43 PM      Result Value Ref Range Status   Specimen Description BLOOD LEFT ANTECUBITAL   Final   Special Requests BOTTLES DRAWN AEROBIC AND ANAEROBIC 5CC   Final   Culture  Setup Time     Final   Value: 08/31/2013 01:03     Performed at [facility]   Culture     Final   Value:        BLOOD CULTURE RECEIVED NO GROWTH TO DATE CULTURE WILL BE HELD FOR 5 DAYS BEFORE ISSUING A FINAL NEGATIVE REPORT     Performed at [facility]   Report Status PENDING   Incomplete  CULTURE, BLOOD (ROUTINE X 2)     Status: None   Collection Time    08/30/13  9:50 PM      Result Value Ref Range Status   Specimen Description BLOOD RIGHT ANTECUBITAL   Final   Special Requests BOTTLES DRAWN AEROBIC ONLY 4CC   Final   Culture  Setup Time     Final   Value: 08/31/2013 01:03     Performed at [facility]   Culture     Final   Value:        BLOOD CULTURE RECEIVED NO GROWTH TO DATE CULTURE WILL BE HELD FOR 5 DAYS BEFORE ISSUING A FINAL NEGATIVE REPORT     Performed at [facility]   Report Status PENDING   Incomplete  CLOSTRIDIUM DIFFICILE BY PCR     Status: None   Collection Time    08/30/13 10:16 PM      Result Value Ref Range Status   C difficile by pcr NEGATIVE  NEGATIVE Final   Comment: Performed at [facility]: Basic Metabolic Panel:  Recent Labs Lab 08/30/13 1827 08/31/13 0345 09/01/13 0403 09/02/13 1250  NA 143 139 139 141  K 3.3* 3.5* 3.6* 3.6*  CL 108 105 105 105  CO2  --  22 23 23   GLUCOSE 103* 127* 109* 110*  BUN 9 9 5* 3*  CREATININE 0.80 0.60 0.65 0.59   CALCIUM  --  8.3* 8.0* 9.1   Liver Function Tests: No results found for this basename: AST, ALT, ALKPHOS, BILITOT, PROT, ALBUMIN,  in the last 168 hours No results found for this basename: LIPASE, AMYLASE,  in the last 168 hours No results found for this basename: AMMONIA,  in the last 168 hours CBC:  Recent Labs Lab 08/30/13 1815 08/30/13 1827 09/01/13 0403 09/02/13 1250  WBC 4.7  --  3.8* 5.4  NEUTROABS 3.4  --   --   --   HGB 9.7* 10.5* 8.1* 9.1*  HCT 29.8* 31.0* 25.5* 27.6*  MCV 98.7  --  100.4* 97.9  PLT 232  --  211 285   Cardiac Enzymes: No results found for this basename: CKTOTAL, CKMB, CKMBINDEX, TROPONINI,  in the last 168 hours BNP: BNP (last 3 results)  Recent Labs  05/01/13 1214 08/30/13 1815  PROBNP 130.0* 12.6   CBG:  Recent Labs Lab 09/02/13 0752 09/02/13 1200 09/02/13 1759 09/02/13 2127 09/03/13 0825  GLUCAP 79 103* 81 100* 88    Time coordinating discharge: Over 30 minutes

## 2013-09-03 NOTE — Discharge Instructions (Signed)

## 2013-09-06 LAB — CULTURE, BLOOD (ROUTINE X 2)
Culture: NO GROWTH
Culture: NO GROWTH

## 2013-09-09 ENCOUNTER — Other Ambulatory Visit: Payer: Self-pay | Admitting: Emergency Medicine

## 2013-09-09 ENCOUNTER — Ambulatory Visit (INDEPENDENT_AMBULATORY_CARE_PROVIDER_SITE_OTHER): Payer: Medicare Other | Admitting: Emergency Medicine

## 2013-09-09 ENCOUNTER — Encounter: Payer: Self-pay | Admitting: Emergency Medicine

## 2013-09-09 VITALS — BP 128/68 | HR 84 | Temp 97.8°F | Resp 18 | Ht 64.75 in | Wt 196.0 lb

## 2013-09-09 DIAGNOSIS — C9000 Multiple myeloma not having achieved remission: Secondary | ICD-10-CM

## 2013-09-09 DIAGNOSIS — R5383 Other fatigue: Secondary | ICD-10-CM

## 2013-09-09 DIAGNOSIS — R6889 Other general symptoms and signs: Secondary | ICD-10-CM

## 2013-09-09 DIAGNOSIS — R5381 Other malaise: Secondary | ICD-10-CM

## 2013-09-09 LAB — CBC WITH DIFFERENTIAL/PLATELET
Basophils Absolute: 0 10*3/uL (ref 0.0–0.1)
Basophils Relative: 0 % (ref 0–1)
Eosinophils Absolute: 0.1 10*3/uL (ref 0.0–0.7)
Eosinophils Relative: 1 % (ref 0–5)
HCT: 27.9 % — ABNORMAL LOW (ref 36.0–46.0)
Hemoglobin: 9.1 g/dL — ABNORMAL LOW (ref 12.0–15.0)
LYMPHS ABS: 1.3 10*3/uL (ref 0.7–4.0)
LYMPHS PCT: 21 % (ref 12–46)
MCH: 31.5 pg (ref 26.0–34.0)
MCHC: 32.6 g/dL (ref 30.0–36.0)
MCV: 96.5 fL (ref 78.0–100.0)
Monocytes Absolute: 1.1 10*3/uL — ABNORMAL HIGH (ref 0.1–1.0)
Monocytes Relative: 18 % — ABNORMAL HIGH (ref 3–12)
NEUTROS ABS: 3.7 10*3/uL (ref 1.7–7.7)
NEUTROS PCT: 60 % (ref 43–77)
Platelets: 329 10*3/uL (ref 150–400)
RBC: 2.89 MIL/uL — AB (ref 3.87–5.11)
RDW: 17 % — ABNORMAL HIGH (ref 11.5–15.5)
WBC: 6.1 10*3/uL (ref 4.0–10.5)

## 2013-09-09 LAB — BASIC METABOLIC PANEL WITH GFR
BUN: 11 mg/dL (ref 6–23)
CO2: 28 meq/L (ref 19–32)
Calcium: 8.6 mg/dL (ref 8.4–10.5)
Chloride: 104 mEq/L (ref 96–112)
Creat: 0.8 mg/dL (ref 0.50–1.10)
GFR, Est Non African American: 79 mL/min
Glucose, Bld: 82 mg/dL (ref 70–99)
POTASSIUM: 3.7 meq/L (ref 3.5–5.3)
SODIUM: 143 meq/L (ref 135–145)

## 2013-09-09 LAB — TSH: TSH: 0.244 u[IU]/mL — ABNORMAL LOW (ref 0.350–4.500)

## 2013-09-09 MED ORDER — ESZOPICLONE 3 MG PO TABS: 3.0000 mg | ORAL_TABLET | Freq: Every day | ORAL | Status: DC

## 2013-09-09 NOTE — Progress Notes (Signed)
Subjective:    Patient ID: [name], female    DOB: 1950/11/02, 63 y.o.   MRN: [id number]  HPI Comments: 63 yo female with recent hospitalization for questionable overdose. Patient and daughter have discussed concerns for mother's safety at home and patient considering moving away from husband to help recover. She has never had an issues with her medications and takes them as prescribed. She has been out of her Lunesta for over 1 week. She is waking more but is able to go to sleep.She had some abnormal labs at hospital and needs rechecked. She is improving with strength but still easily fatigued and mildly off balance with walking. She does have home health/ PT that are currently coming out several times a week. She notes the PT is helping with strength but balance is still unsteady. Home health has recommended shower seat/ walker and continued therapy. Patient notes she is not driving due to fatigue and it is difficult to get in for OV due to her inabiltiy to drive and difficulty with walking.    Current Outpatient Prescriptions on File Prior to Visit  Medication Sig Dispense Refill  . ALPRAZolam (XANAX) 0.5 MG tablet Take 1 tablet (0.5 mg total) by mouth 3 (three) times daily as needed for anxiety.  30 tablet  0  . Alum & Mag Hydroxide-Simeth (MAGIC MOUTHWASH W/LIDOCAINE) SOLN Take 5 mLs by mouth 3 (three) times daily.  100 mL  0  . atorvastatin (LIPITOR) 40 MG tablet Take 1 tablet (40 mg total) by mouth daily.  30 tablet  3  . benzonatate (TESSALON) 100 MG capsule Take 1 capsule (100 mg total) by mouth 3 (three) times daily as needed for cough.  60 capsule  0  . clopidogrel (PLAVIX) 75 MG tablet Take 1 tablet (75 mg total) by mouth daily.  30 tablet  0  . dexamethasone (DECADRON) 4 MG tablet TAKE 10 TABLETS BY MOUTH ON A WEEKLY BASIS STARTING WITH THE FIRST CYCLE OF CHEMOTHERAPY.  80 tablet  2  . dexlansoprazole (DEXILANT) 60 MG capsule Take 1 capsule (60 mg total) by mouth daily.  30  capsule  6  . ESZOPICLONE 3 MG tablet Take 3 mg by mouth at bedtime.       . furosemide (LASIX) 40 MG tablet Take 1 tablet (40 mg total) by mouth daily.  30 tablet  0  . gabapentin (NEURONTIN) 600 MG tablet Take 600 mg by mouth 2 (two) times daily.       Marland Kitchen HYDROcodone-acetaminophen (NORCO) 7.5-325 MG per tablet Take 1 tablet by mouth every 6 (six) hours as needed (PAIN).  30 tablet  0  . levofloxacin (LEVAQUIN) 750 MG tablet Take 1 tablet (750 mg total) by mouth daily.  10 tablet  0  . levothyroxine (SYNTHROID, LEVOTHROID) 100 MCG tablet Take 100 mcg by mouth daily.       Marland Kitchen lidocaine-prilocaine (EMLA) cream APPLY TO PORT 1 TO 2 HRS PRIOR TO PROCEDURE  30 g  1  . loperamide (IMODIUM) 2 MG capsule Take 2 mg by mouth 4 (four) times daily as needed for diarrhea or loose stools.      Marland Kitchen morphine (MS CONTIN) 15 MG 12 hr tablet Take 1 tablet (15 mg total) by mouth 2 (two) times daily.  60 tablet  0  . Multiple Vitamin (MULITIVITAMIN WITH MINERALS) TABS Take 1 tablet by mouth daily.      . ondansetron (ZOFRAN-ODT) 4 MG disintegrating tablet Take 1 tablet (4 mg total)  by mouth every 8 (eight) hours as needed for nausea.  20 tablet  0  . potassium chloride (K-DUR,KLOR-CON) 10 MEQ tablet Take 1 tablet (10 mEq total) by mouth 2 (two) times daily.  60 tablet  3  . PRESCRIPTION MEDICATION carfilzomib (KYPROLIS) 56 mg in dextrose 5 % 50 mL chemo infusion 27 mg/m2  2.07 m2 (Treatment Plan Actual) Once 08/17/2013       Current Facility-Administered Medications on File Prior to Visit  Medication Dose Route Frequency Provider Last Rate Last Dose  . 0.9 %  sodium chloride infusion   Intravenous Once Curt Bears, MD      . ondansetron (ZOFRAN) IVPB 8 mg  8 mg Intravenous Once Curt Bears, MD      . sodium chloride 0.9 % injection 10 mL  10 mL Intracatheter PRN Curt Bears, MD       No Known Allergies Past Medical History  Diagnosis Date  . Thyroid disease Hypothyroidism  . Hypercholesterolemia   .  Compression fracture 04/08/2007    pathologic compression fracture  . Hypothyroidism   . FHx: chemotherapy     s/p 5 cycle revlimid/low dose decadron,s/p velcade,doxil,decadron,  . Hx of radiation therapy 05/05/07-05/18/07,& 03/05/11-03/21/11-    l3&l5 in 2008, t2-t6 03/2011  . GERD (gastroesophageal reflux disease)   . Insomnia     associated with steroids  . Nausea   . Constipation     takes oxycontin,vicodin  . Hx of radiation therapy 05/05/2007 to 05/18/2007    palliative, L3-5  . 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 Hx of radiation therapy 03/05/2011 to 03/21/2011    palliative T2-T6, c-spine  . 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 PONV (postoperative nausea and vomiting)   . Metastasis to bone      Review of Systems  Constitutional: Positive for fatigue.  Musculoskeletal: Positive for gait problem.  Psychiatric/Behavioral: Negative for suicidal ideas.  All other systems reviewed and are negative.   BP 128/68  Pulse 84  Temp(Src) 97.8 F (36.6 C) (Temporal)  Resp 18  Ht 5' 4.75" (1.645 m)  Wt 196 lb (88.905 kg)  BMI 32.85 kg/m2     Objective:   Physical Exam  Nursing note and vitals reviewed. Constitutional: She is oriented to person, place, and time. She appears well-developed and well-nourished. No distress.  HENT:  Head: Normocephalic and atraumatic.  Right Ear: External ear normal.  Left Ear: External ear normal.  Nose: Nose normal.  Mouth/Throat: Oropharynx is clear and moist.  Eyes: Conjunctivae and EOM are normal.  Neck: Normal range of motion. Neck supple. No JVD present. No thyromegaly present.  Cardiovascular: Normal rate, regular rhythm, normal heart sounds and intact distal pulses.   Pulmonary/Chest: Effort normal and breath sounds normal.  Abdominal: Soft. Bowel sounds are normal. She exhibits no distension and no mass. There is no tenderness. There is no rebound and no guarding.  Musculoskeletal: Normal range of  motion. She exhibits no edema and no tenderness.  Lymphadenopathy:    She has no cervical adenopathy.  Neurological: She is alert and oriented to person, place, and time. She has normal reflexes. No cranial nerve deficit. Coordination abnormal.  + difficulty with ambulation, she is unsteady on feet and uses wall to steady her gait.   Skin: Skin is warm and dry. No rash noted. No erythema. No pallor.  Psychiatric: She has a normal mood and affect. Her behavior is normal. Judgment and  thought content normal.          Assessment & Plan:  1. Abnormal lab- recheck labs 2. Fatigue vs medication SE vs recent hospitalization- check labs, increase activity and H2O. Advised do not take lunesta with pain medication or xanax. Advised try 1/2 of Lunesta for sleep with medication concerns. Referral for home health performed.

## 2013-09-09 NOTE — Patient Instructions (Signed)

## 2013-09-13 LAB — IRON AND TIBC
%SAT: 12 % — ABNORMAL LOW (ref 20–55)
IRON: 37 ug/dL — AB (ref 42–145)
TIBC: 305 ug/dL (ref 250–470)
UIBC: 268 ug/dL (ref 125–400)

## 2013-09-13 LAB — VITAMIN B12: Vitamin B-12: 803 pg/mL (ref 211–911)

## 2013-09-15 ENCOUNTER — Other Ambulatory Visit: Payer: Self-pay

## 2013-09-15 ENCOUNTER — Other Ambulatory Visit: Payer: Self-pay | Admitting: *Deleted

## 2013-09-15 ENCOUNTER — Other Ambulatory Visit: Payer: Self-pay | Admitting: Internal Medicine

## 2013-09-15 DIAGNOSIS — C9000 Multiple myeloma not having achieved remission: Secondary | ICD-10-CM

## 2013-09-15 DIAGNOSIS — E611 Iron deficiency: Secondary | ICD-10-CM

## 2013-09-15 MED ORDER — MORPHINE SULFATE ER 15 MG PO TBCR
15.0000 mg | EXTENDED_RELEASE_TABLET | Freq: Two times a day (BID) | ORAL | Status: DC
Start: 2013-09-15 — End: 2013-10-15

## 2013-09-22 ENCOUNTER — Ambulatory Visit (INDEPENDENT_AMBULATORY_CARE_PROVIDER_SITE_OTHER): Payer: Medicare Other | Admitting: Internal Medicine

## 2013-09-22 ENCOUNTER — Encounter: Payer: Self-pay | Admitting: Internal Medicine

## 2013-09-22 DIAGNOSIS — M25469 Effusion, unspecified knee: Secondary | ICD-10-CM

## 2013-09-22 DIAGNOSIS — Z8679 Personal history of other diseases of the circulatory system: Secondary | ICD-10-CM

## 2013-09-22 DIAGNOSIS — C9000 Multiple myeloma not having achieved remission: Secondary | ICD-10-CM

## 2013-09-22 DIAGNOSIS — M25569 Pain in unspecified knee: Secondary | ICD-10-CM

## 2013-09-22 DIAGNOSIS — K219 Gastro-esophageal reflux disease without esophagitis: Secondary | ICD-10-CM

## 2013-09-22 DIAGNOSIS — E039 Hypothyroidism, unspecified: Secondary | ICD-10-CM

## 2013-09-22 DIAGNOSIS — M25461 Effusion, right knee: Secondary | ICD-10-CM

## 2013-09-22 MED ORDER — PREDNISONE 20 MG PO TABS: 20.0000 mg | ORAL_TABLET | ORAL | Status: DC

## 2013-09-22 NOTE — Progress Notes (Signed)
   Subjective:    Patient ID: [name], female    DOB: Nov 25, 1950, 63 y.o.   MRN: [id number]  HPI Very nice 63 yo MBF with multiple problems including HTN, and relapsed MM s/p auto stem cell transplant presenting with 3 day Hx/o unprovoked Bilat knee pains - altho she is s/p Rt knee surgery in the past by Dr Lorin Mercy.  Review of Systems Systems review is negative to above.  Objective:   Physical Exam   BP 114/64    P  92    R  16    T  97.4   Wt 200.4 #    Ht  5 ' 4.75 "  Focused exam finds a healed scar of the medial rt knee awith boggy synovium and suspected effusion of the Rt knee. There is slight to moderate tenderness of Both knees w/o erythema, warmth or skin changes. Joint stability is intact. Gait is slow and slightly limping favoring the Rt leg.  Assessment & Plan:  1. Effusion of knee joint right - predniSONE (DELTASONE) 20 MG tablet; Take 1 tablet (20 mg total) by mouth See admin instructions. 1 tab 3 x day for 3 days, then 1 tab 2 x day for 3 days, then 1 tab 1 x day for 5 days  Dispense: 20 tablet; Refill: 0 - Ambulatory referral to Orthopedic Surgery  2. Pain in joint, lower leg

## 2013-09-22 NOTE — Patient Instructions (Signed)

## 2013-09-23 ENCOUNTER — Ambulatory Visit: Admission: RE | Admit: 2013-09-23 | Payer: Medicare Other | Source: Ambulatory Visit | Admitting: Radiation Oncology

## 2013-09-24 ENCOUNTER — Encounter: Payer: Self-pay | Admitting: Internal Medicine

## 2013-09-24 NOTE — Progress Notes (Signed)
onynx checking to see if still on kyprolis. I sent them eobs.

## 2013-09-29 ENCOUNTER — Other Ambulatory Visit: Payer: Self-pay | Admitting: Internal Medicine

## 2013-09-29 ENCOUNTER — Ambulatory Visit: Payer: Self-pay

## 2013-10-14 ENCOUNTER — Other Ambulatory Visit: Payer: Self-pay | Admitting: Medical Oncology

## 2013-10-15 ENCOUNTER — Other Ambulatory Visit: Payer: Self-pay | Admitting: Medical Oncology

## 2013-10-15 DIAGNOSIS — C9000 Multiple myeloma not having achieved remission: Secondary | ICD-10-CM

## 2013-10-15 MED ORDER — MORPHINE SULFATE ER 15 MG PO TBCR: 15.0000 mg | EXTENDED_RELEASE_TABLET | Freq: Two times a day (BID) | ORAL | Status: DC

## 2013-10-15 MED ORDER — HYDROCODONE-ACETAMINOPHEN 7.5-325 MG PO TABS: 1.0000 | ORAL_TABLET | Freq: Four times a day (QID) | ORAL | Status: DC | PRN

## 2013-10-15 NOTE — Telephone Encounter (Signed)
Pt notified that pain med rx ( 2) are ready for pick up . She will pick them up on Tuesday. Rx locked in injection room.

## 2013-10-18 ENCOUNTER — Encounter: Payer: Self-pay | Admitting: Emergency Medicine

## 2013-10-18 ENCOUNTER — Other Ambulatory Visit: Payer: Self-pay | Admitting: Internal Medicine

## 2013-10-19 ENCOUNTER — Other Ambulatory Visit: Payer: Medicare Other

## 2013-10-19 ENCOUNTER — Ambulatory Visit (HOSPITAL_COMMUNITY)
Admission: RE | Admit: 2013-10-19 | Discharge: 2013-10-19 | Disposition: A | Discharge status: Home / Self Care | Payer: Medicare Other | Source: Ambulatory Visit | Attending: Internal Medicine | Admitting: Internal Medicine

## 2013-10-19 ENCOUNTER — Other Ambulatory Visit (HOSPITAL_BASED_OUTPATIENT_CLINIC_OR_DEPARTMENT_OTHER): Payer: Medicare Other

## 2013-10-19 DIAGNOSIS — M949 Disorder of cartilage, unspecified: Secondary | ICD-10-CM

## 2013-10-19 DIAGNOSIS — M51379 Other intervertebral disc degeneration, lumbosacral region without mention of lumbar back pain or lower extremity pain: Secondary | ICD-10-CM | POA: Insufficient documentation

## 2013-10-19 DIAGNOSIS — R05 Cough: Secondary | ICD-10-CM | POA: Insufficient documentation

## 2013-10-19 DIAGNOSIS — C9000 Multiple myeloma not having achieved remission: Secondary | ICD-10-CM

## 2013-10-19 DIAGNOSIS — R059 Cough, unspecified: Secondary | ICD-10-CM | POA: Insufficient documentation

## 2013-10-19 DIAGNOSIS — C9002 Multiple myeloma in relapse: Secondary | ICD-10-CM

## 2013-10-19 DIAGNOSIS — M899 Disorder of bone, unspecified: Secondary | ICD-10-CM | POA: Insufficient documentation

## 2013-10-19 DIAGNOSIS — M5137 Other intervertebral disc degeneration, lumbosacral region: Secondary | ICD-10-CM | POA: Insufficient documentation

## 2013-10-19 DIAGNOSIS — K219 Gastro-esophageal reflux disease without esophagitis: Secondary | ICD-10-CM | POA: Insufficient documentation

## 2013-10-19 LAB — COMPREHENSIVE METABOLIC PANEL (CC13)
ALT: 13 U/L (ref 0–55)
ANION GAP: 11 meq/L (ref 3–11)
AST: 13 U/L (ref 5–34)
Albumin: 3.6 g/dL (ref 3.5–5.0)
Alkaline Phosphatase: 79 U/L (ref 40–150)
BUN: 12.2 mg/dL (ref 7.0–26.0)
CO2: 24 meq/L (ref 22–29)
CREATININE: 0.8 mg/dL (ref 0.6–1.1)
Calcium: 9.7 mg/dL (ref 8.4–10.4)
Chloride: 108 mEq/L (ref 98–109)
GLUCOSE: 104 mg/dL (ref 70–140)
Potassium: 3.5 mEq/L (ref 3.5–5.1)
SODIUM: 142 meq/L (ref 136–145)
TOTAL PROTEIN: 7.2 g/dL (ref 6.4–8.3)
Total Bilirubin: 0.93 mg/dL (ref 0.20–1.20)

## 2013-10-19 LAB — CBC WITH DIFFERENTIAL/PLATELET
BASO%: 0.4 % (ref 0.0–2.0)
BASOS ABS: 0 10*3/uL (ref 0.0–0.1)
EOS%: 1.2 % (ref 0.0–7.0)
Eosinophils Absolute: 0.1 10*3/uL (ref 0.0–0.5)
HEMATOCRIT: 32.4 % — AB (ref 34.8–46.6)
HEMOGLOBIN: 10.6 g/dL — AB (ref 11.6–15.9)
LYMPH%: 26.7 % (ref 14.0–49.7)
MCH: 32.3 pg (ref 25.1–34.0)
MCHC: 32.6 g/dL (ref 31.5–36.0)
MCV: 99.1 fL (ref 79.5–101.0)
MONO#: 0.3 10*3/uL (ref 0.1–0.9)
MONO%: 8.3 % (ref 0.0–14.0)
NEUT#: 2.7 10*3/uL (ref 1.5–6.5)
NEUT%: 63.4 % (ref 38.4–76.8)
Platelets: 240 10*3/uL (ref 145–400)
RBC: 3.26 10*6/uL — ABNORMAL LOW (ref 3.70–5.45)
RDW: 17.2 % — ABNORMAL HIGH (ref 11.2–14.5)
WBC: 4.2 10*3/uL (ref 3.9–10.3)
lymph#: 1.1 10*3/uL (ref 0.9–3.3)

## 2013-10-19 LAB — LACTATE DEHYDROGENASE (CC13): LDH: 179 U/L (ref 125–245)

## 2013-10-21 LAB — IGG, IGA, IGM
IGM, SERUM: 27 mg/dL — AB (ref 52–322)
IgA: 114 mg/dL (ref 69–380)
IgG (Immunoglobin G), Serum: 1490 mg/dL (ref 690–1700)

## 2013-10-21 LAB — KAPPA/LAMBDA LIGHT CHAINS
Kappa free light chain: 2.21 mg/dL — ABNORMAL HIGH (ref 0.33–1.94)
Kappa:Lambda Ratio: 0.18 — ABNORMAL LOW (ref 0.26–1.65)
LAMBDA FREE LGHT CHN: 12 mg/dL — AB (ref 0.57–2.63)

## 2013-10-21 LAB — BETA 2 MICROGLOBULIN, SERUM: Beta-2 Microglobulin: 2.65 mg/L — ABNORMAL HIGH (ref <=2.51)

## 2013-10-22 ENCOUNTER — Encounter: Payer: Self-pay | Admitting: Internal Medicine

## 2013-10-22 NOTE — Progress Notes (Signed)
Checking with [employer] to see if grant available for her. $  400.00

## 2013-10-25 ENCOUNTER — Encounter: Payer: Self-pay | Admitting: Internal Medicine

## 2013-10-25 NOTE — Progress Notes (Signed)
Spoke with patient about poss asst 400.00 one time grant. She is requesting another 100.00. Advised her of approval and will let her know if possible.

## 2013-10-26 ENCOUNTER — Encounter: Payer: Self-pay | Admitting: Internal Medicine

## 2013-10-26 ENCOUNTER — Ambulatory Visit (HOSPITAL_BASED_OUTPATIENT_CLINIC_OR_DEPARTMENT_OTHER): Payer: Medicare Other | Admitting: Internal Medicine

## 2013-10-26 ENCOUNTER — Telehealth: Payer: Self-pay | Admitting: Internal Medicine

## 2013-10-26 VITALS — BP 113/76 | HR 87 | Temp 98.5°F | Resp 20 | Ht 64.75 in | Wt 194.5 lb

## 2013-10-26 DIAGNOSIS — R05 Cough: Secondary | ICD-10-CM

## 2013-10-26 DIAGNOSIS — C9000 Multiple myeloma not having achieved remission: Secondary | ICD-10-CM

## 2013-10-26 DIAGNOSIS — R0602 Shortness of breath: Secondary | ICD-10-CM

## 2013-10-26 DIAGNOSIS — R059 Cough, unspecified: Secondary | ICD-10-CM

## 2013-10-26 DIAGNOSIS — C9002 Multiple myeloma in relapse: Secondary | ICD-10-CM

## 2013-10-26 DIAGNOSIS — G609 Hereditary and idiopathic neuropathy, unspecified: Secondary | ICD-10-CM

## 2013-10-26 NOTE — Progress Notes (Signed)
[place] [phone]   Fax:(336) [place], [place] [address]  DIAGNOSIS: Recurrent multiple myeloma initially diagnosed in October 2008.   PRIOR THERAPY:  1. Status post palliative radiotherapy to the lumbar spine between L3 and L5. The patient received a total dose of 3000 cGy in 10 fractions under the care of Dr. Lisbeth Renshaw between May 05, 2007 through May 18, 2007. 2. Status post 5 cycles of systemic chemotherapy with Revlimid and low-dose Decadron with good response to this treatment. 3. Status post autologous peripheral blood stem cell transplant at [facility] on Nov 20, 2007 under the care of Dr. Valarie Merino. 4. Status post treatment for disease recurrence with Velcade, Doxil and Decadron. Last dose given Nov 09, 2009. Discontinued secondary to intolerance but the patient had a good response to treatment at that time. 5. Status post palliative radiotherapy to the T2-T6 thoracic vertebrae completed 03/21/2011 under the care of Dr. Lisbeth Renshaw. 6. Systemic chemotherapy with Velcade 1.3 mg per meter squared given on days 1, 4, 8 and 11, and Doxil at 30 mg per meter squared given on day 4 in addition to Decadron status post 4 cycles, discontinued secondary to intolerance. 7. Systemic therapy with Velcade 1.3 mg/M2 subcutaneously in addition to Decadron 40 mg by mouth on a weekly basis, status post 20 cycles. The patient had good response with this treatment but it is discontinue today secondary to worsening peripheral neuropathy. 8. Palliative radiotherapy to the skull lesion as well as the left hip area under the care of Dr. Lisbeth Renshaw. 9. Systemic chemotherapy with Carfilzomib 20 mg/M2 on days 1, 2,  8, 9, 13 and 16 every 4 weeks in addition to weekly Decadron 40 mg by mouth. First dose on 04/19/2013. Status post 4 cycles, discontinued recently secondary to cardiac  dysfunction.  CURRENT THERAPY:   1. Zometa 4 mg IV given every 3 months   INTERVAL HISTORY: [name] 63 y.o. female returns to the clinic today for follow up visit accompanied by her 2 daughters. The patient is feeling fine today with no specific complaints except for intermittent aching pain especially in the back. She has been observation for the last 2 months after she was diagnosed with questionable congestive heart failure secondary to her treatment with Carfilzomib. She was seen by Dr. Evelene Croon at [facility] at that time and recommendation was to hold her systemic therapy. She denied having any significant fatigue or weakness. The patient denied having any significant chest pain, shortness of breath, cough or hemoptysis. She has no nausea or vomiting. She has no weight loss or night sweats. She continues to have mild peripheral neuropathy. The patient has repeat myeloma panel performed recently and she is here for evaluation and discussion of her lab results.  MEDICAL HISTORY: Past Medical History  Diagnosis Date  . Thyroid disease Hypothyroidism  . Hypercholesterolemia   . Compression fracture 04/08/2007    pathologic compression fracture  . Hypothyroidism   . FHx: chemotherapy     s/p 5 cycle revlimid/low dose decadron,s/p velcade,doxil,decadron,  . Hx of radiation therapy 05/05/07-05/18/07,& 03/05/11-03/21/11-    l3&l5 in 2008, t2-t6 03/2011  . GERD (gastroesophageal reflux disease)   . Insomnia     associated with steroids  . Nausea   . Constipation     takes oxycontin,vicodin  . Hx of radiation therapy 05/05/2007 to 05/18/2007    palliative, L3-5  .  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 Hx of radiation therapy 03/05/2011 to 03/21/2011    palliative T2-T6, c-spine  . 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 PONV (postoperative nausea and vomiting)   . Metastasis to bone     ALLERGIES:  has No Known Allergies.  MEDICATIONS:   Current Outpatient Prescriptions  Medication Sig Dispense Refill  . ALPRAZolam (XANAX) 0.5 MG tablet Take 1 tablet (0.5 mg total) by mouth 3 (three) times daily as needed for anxiety.  30 tablet  0  . Alum & Mag Hydroxide-Simeth (MAGIC MOUTHWASH W/LIDOCAINE) SOLN Take 5 mLs by mouth 3 (three) times daily.  100 mL  0  . atorvastatin (LIPITOR) 40 MG tablet TAKE 1 TABLET (40 MG TOTAL) BY MOUTH DAILY.  30 tablet  3  . clopidogrel (PLAVIX) 75 MG tablet Take 1 tablet (75 mg total) by mouth daily.  30 tablet  0  . dexamethasone (DECADRON) 4 MG tablet TAKE 10 TABLETS BY MOUTH ON A WEEKLY BASIS STARTING WITH THE FIRST CYCLE OF CHEMOTHERAPY.  80 tablet  2  . dexlansoprazole (DEXILANT) 60 MG capsule Take 1 capsule (60 mg total) by mouth daily.  30 capsule  6  . Eszopiclone 3 MG TABS TAKE 1 TABLET BY MOUTH AT BEDTIME AS NEEDED  30 tablet  5  . furosemide (LASIX) 40 MG tablet Take 1 tablet (40 mg total) by mouth daily.  30 tablet  0  . gabapentin (NEURONTIN) 600 MG tablet Take 600 mg by mouth 2 (two) times daily.       Marland Kitchen HYDROcodone-acetaminophen (NORCO) 7.5-325 MG per tablet Take 1 tablet by mouth every 6 (six) hours as needed (PAIN).  30 tablet  0  . KLOR-CON M10 10 MEQ tablet TAKE 1 TABLET BY MOUTH TWICE A DAY  60 tablet  3  . lidocaine-prilocaine (EMLA) cream APPLY TO PORT 1 TO 2 HRS PRIOR TO PROCEDURE  30 g  1  . loperamide (IMODIUM) 2 MG capsule Take 2 mg by mouth 4 (four) times daily as needed for diarrhea or loose stools.      Marland Kitchen morphine (MS CONTIN) 15 MG 12 hr tablet Take 1 tablet (15 mg total) by mouth 2 (two) times daily.  60 tablet  0  . Multiple Vitamin (MULITIVITAMIN WITH MINERALS) TABS Take 1 tablet by mouth daily.      . ondansetron (ZOFRAN-ODT) 4 MG disintegrating tablet Take 1 tablet (4 mg total) by mouth every 8 (eight) hours as needed for nausea.  20 tablet  0  . predniSONE (DELTASONE) 20 MG tablet Take 1 tablet (20 mg total) by mouth See admin instructions. 1 tab 3 x day for 3 days, then  1 tab 2 x day for 3 days, then 1 tab 1 x day for 5 days  20 tablet  0  . PRESCRIPTION MEDICATION carfilzomib (KYPROLIS) 56 mg in dextrose 5 % 50 mL chemo infusion 27 mg/m2  2.07 m2 (Treatment Plan Actual) Once 08/17/2013      . SYNTHROID 100 MCG tablet TAKE 1 TABLET BY MOUTH EVERY DAY  90 tablet  1   No current facility-administered medications for this visit.   Facility-Administered Medications Ordered in Other Visits  Medication Dose Route Frequency Provider Last Rate Last Dose  . 0.9 %  sodium chloride infusion   Intravenous Once Curt Bears, MD      . ondansetron (ZOFRAN) IVPB 8 mg  8 mg Intravenous Once [facility]  Demaya Hardge, MD      . sodium chloride 0.9 % injection 10 mL  10 mL Intracatheter PRN Curt Bears, MD        SURGICAL HISTORY:  Past Surgical History  Procedure Laterality Date  . Cholecystectomy    . Knee surgery    . Knee surgery    . Video bronchoscopy  07/30/2011    Procedure: VIDEO BRONCHOSCOPY WITHOUT FLUORO;  Surgeon: Kathee Delton, MD;  Location: [facility];  Service: Cardiopulmonary;  Laterality: Bilateral;    REVIEW OF SYSTEMS:  Constitutional: negative Eyes: negative Ears, nose, mouth, throat, and face: negative Respiratory: negative Cardiovascular: negative Gastrointestinal: negative Genitourinary:negative Integument/breast: negative Hematologic/lymphatic: negative Musculoskeletal:positive for back pain Neurological: positive for seizures Behavioral/Psych: negative Endocrine: negative Allergic/Immunologic: negative   PHYSICAL EXAMINATION: General appearance: alert, cooperative, fatigued and no distress Head: Normocephalic, without obvious abnormality, atraumatic Neck: no adenopathy, no JVD, supple, symmetrical, trachea midline and thyroid not enlarged, symmetric, no tenderness/mass/nodules Lymph nodes: Cervical, supraclavicular, and axillary nodes normal. Resp: clear to auscultation bilaterally Back: symmetric, no curvature. ROM normal. No CVA  tenderness. Cardio: regular rate and rhythm, S1, S2 normal, no murmur, click, rub or gallop GI: soft, non-tender; bowel sounds normal; no masses,  no organomegaly Extremities: extremities normal, atraumatic, no cyanosis or edema Neurologic: Alert and oriented X 3, normal strength and tone. Normal symmetric reflexes. Normal coordination and gait  ECOG PERFORMANCE STATUS: 1 - Symptomatic but completely ambulatory  There were no vitals taken for this visit.  LABORATORY DATA: Lab Results  Component Value Date   WBC 4.2 10/19/2013   HGB 10.6* 10/19/2013   HCT 32.4* 10/19/2013   MCV 99.1 10/19/2013   PLT 240 10/19/2013      Chemistry      Component Value Date/Time   NA 142 10/19/2013 1209   NA 143 09/09/2013 1434   K 3.5 10/19/2013 1209   K 3.7 09/09/2013 1434   CL 104 09/09/2013 1434   CL 105 12/17/2012 1015   CO2 24 10/19/2013 1209   CO2 28 09/09/2013 1434   BUN 12.2 10/19/2013 1209   BUN 11 09/09/2013 1434   CREATININE 0.8 10/19/2013 1209   CREATININE 0.80 09/09/2013 1434   CREATININE 0.59 09/02/2013 1250      Component Value Date/Time   CALCIUM 9.7 10/19/2013 1209   CALCIUM 8.6 09/09/2013 1434   ALKPHOS 79 10/19/2013 1209   ALKPHOS 78 02/24/2013 1500   AST 13 10/19/2013 1209   AST 22 02/24/2013 1500   ALT 13 10/19/2013 1209   ALT 26 02/24/2013 1500   BILITOT 0.93 10/19/2013 1209   BILITOT 0.4 02/24/2013 1500     Other lab results: Beta-2 microglobulin 2.65, free kappa light chain 2.21, free lambda light chain 12.00, kappa/lambda ratio 0.18, IgG 1490, IgA 114 and IgM 27.  RADIOGRAPHIC STUDIES:  ASSESSMENT AND PLAN:  This is a very pleasant 63 years old [place] American female with recurrent multiple myeloma recently completed a course of treatment with Velcade and Decadron with improvement in her disease but this was discontinued secondary to peripheral neuropathy. The patient is tolerating her treatment with Carfilzomib and Decadron fairly well except for the recent shortness breath and cough  which was felt to be secondary to congestive heart failure from her treatment with Carfilzomib. The patient has been off treatment for the last 2 months and repeat myeloma panel showed stable disease except for elevation of the free lambda light chain which increased from 4.56 to 12.00. I had a lengthy discussion  with the patient and her family today about her current disease status and treatment options. I also spoke to Dr. Evelene Croon regarding her current lab results and treatment options. Recommendation was to continue the patient on observation for now with repeat myeloma panel in one month. If she continues to have evidence for progression of her disease, the patient may be considered for treatment with Pomalyst and Decadron or referred to [facility] for consideration of enrollment in a clinical trial. The patient will continue her current treatment with Zometa as scheduled. She was advised to call immediately if she has any concerning symptoms in the interval. The patient voices understanding of current disease status and treatment options and is in agreement with the current care plan.  All questions were answered. The patient knows to call the clinic with any problems, questions or concerns. We can certainly see the patient much sooner if necessary.  Disclaimer: This note was dictated with voice recognition software. Similar sounding words can inadvertently be transcribed and may not be corrected upon review.

## 2013-10-26 NOTE — Telephone Encounter (Signed)
Gave npt appt for lab and MD on May 2015

## 2013-10-28 ENCOUNTER — Telehealth: Payer: Self-pay | Admitting: Medical Oncology

## 2013-10-28 NOTE — Telephone Encounter (Signed)
Returned pts call " what can I do to lower that protein?". I told her the best thing to do is to keep herself hydrated and that there is nothing she can do to bring it down and explained that  Dr Julien Nordmann will repeat protein level  in in one month and evaluate need for different treatment.

## 2013-11-03 ENCOUNTER — Other Ambulatory Visit: Payer: Self-pay

## 2013-11-03 DIAGNOSIS — Z1231 Encounter for screening mammogram for malignant neoplasm of breast: Secondary | ICD-10-CM

## 2013-11-04 ENCOUNTER — Ambulatory Visit (INDEPENDENT_AMBULATORY_CARE_PROVIDER_SITE_OTHER): Payer: Medicare Other | Admitting: Internal Medicine

## 2013-11-04 ENCOUNTER — Encounter: Payer: Self-pay | Admitting: Internal Medicine

## 2013-11-04 VITALS — BP 124/70 | HR 88 | Temp 97.0°F | Resp 16 | Ht 64.75 in | Wt 195.6 lb

## 2013-11-04 DIAGNOSIS — E559 Vitamin D deficiency, unspecified: Secondary | ICD-10-CM | POA: Insufficient documentation

## 2013-11-04 DIAGNOSIS — N183 Chronic kidney disease, stage 3 (moderate): Secondary | ICD-10-CM

## 2013-11-04 DIAGNOSIS — E1129 Type 2 diabetes mellitus with other diabetic kidney complication: Secondary | ICD-10-CM

## 2013-11-04 DIAGNOSIS — R7309 Other abnormal glucose: Secondary | ICD-10-CM

## 2013-11-04 DIAGNOSIS — J029 Acute pharyngitis, unspecified: Secondary | ICD-10-CM

## 2013-11-04 DIAGNOSIS — E782 Mixed hyperlipidemia: Secondary | ICD-10-CM

## 2013-11-04 DIAGNOSIS — E1122 Type 2 diabetes mellitus with diabetic chronic kidney disease: Secondary | ICD-10-CM | POA: Insufficient documentation

## 2013-11-04 DIAGNOSIS — Z79899 Other long term (current) drug therapy: Secondary | ICD-10-CM

## 2013-11-04 DIAGNOSIS — I10 Essential (primary) hypertension: Secondary | ICD-10-CM

## 2013-11-04 LAB — CBC WITH DIFFERENTIAL/PLATELET
BASOS PCT: 0 % (ref 0–1)
Basophils Absolute: 0 10*3/uL (ref 0.0–0.1)
EOS ABS: 0.1 10*3/uL (ref 0.0–0.7)
EOS PCT: 1 % (ref 0–5)
HCT: 30.9 % — ABNORMAL LOW (ref 36.0–46.0)
Hemoglobin: 10.2 g/dL — ABNORMAL LOW (ref 12.0–15.0)
Lymphocytes Relative: 22 % (ref 12–46)
Lymphs Abs: 1.2 10*3/uL (ref 0.7–4.0)
MCH: 31.5 pg (ref 26.0–34.0)
MCHC: 33 g/dL (ref 30.0–36.0)
MCV: 95.4 fL (ref 78.0–100.0)
Monocytes Absolute: 0.5 10*3/uL (ref 0.1–1.0)
Monocytes Relative: 10 % (ref 3–12)
NEUTROS PCT: 67 % (ref 43–77)
Neutro Abs: 3.6 10*3/uL (ref 1.7–7.7)
PLATELETS: 230 10*3/uL (ref 150–400)
RBC: 3.24 MIL/uL — ABNORMAL LOW (ref 3.87–5.11)
RDW: 16.4 % — ABNORMAL HIGH (ref 11.5–15.5)
WBC: 5.4 10*3/uL (ref 4.0–10.5)

## 2013-11-04 LAB — HEMOGLOBIN A1C
Hgb A1c MFr Bld: 5.9 % — ABNORMAL HIGH (ref <5.7)
MEAN PLASMA GLUCOSE: 123 mg/dL — AB (ref <117)

## 2013-11-04 MED ORDER — GABAPENTIN 600 MG PO TABS: ORAL_TABLET | ORAL | Status: DC

## 2013-11-04 MED ORDER — AZITHROMYCIN 250 MG PO TABS: ORAL_TABLET | ORAL | Status: DC

## 2013-11-04 NOTE — Progress Notes (Signed)
Patient ID: [name], female   DOB: 23-Jan-1951, 63 y.o.   MRN: [id number]    This very nice 63 y.o. MBF presents for 3 month follow up with Hypertension, HypothyroidismHyperlipidemia, MM, T2 NIDDM w/ Stage CKD and Vitamin D Deficiency. Patient is actively under going treatment for MM in relapse.   Labile HTN predates many years and has been treated with a diuretic. BP has been controlled with today's BP: 124/70 mmHg. Patient denies any cardiac type chest pain, palpitations, dyspnea/orthopnea/PND, dizziness, claudication, or dependent edema.   Hyperlipidemia is controlled with diet & meds. Last lipids in Jan 2015 were at goal as below. Patient denies myalgias or other med SE's.  Lab Results  Component Value Date   CHOL 135 07/22/2013   HDL 57 07/22/2013   LDLCALC 42 07/22/2013   TRIG 180* 07/22/2013   CHOLHDL 2.4 07/22/2013    Also, the patient has history of T2 NIDDM with A1c 6.2% in Sept 2014 and managed with diet and last A1c of 5.9% in Jan 2015. Patient denies any symptoms of reactive hypoglycemia, diabetic polys, paresthesias or visual blurring.   Further, Patient has history of Vitamin D Deficiency with last vitamin D of 53 in 2014.Marland Kitchen Patient supplements vitamin D without any suspected side-effects.    Medication List       This list is accurate as of: 11/04/13  6:49 PM.  Always use your most recent med list.               ALPRAZolam 0.5 MG tablet  Commonly known as:  XANAX  Take 1 tablet (0.5 mg total) by mouth 3 (three) times daily as needed for anxiety.     atorvastatin 40 MG tablet  Commonly known as:  LIPITOR  TAKE 1 TABLET (40 MG TOTAL) BY MOUTH DAILY.     azithromycin 250 MG tablet  Commonly known as:  ZITHROMAX  Take 2 tablets (500 mg) on  Day 1,  followed by 1 tablet (250 mg) once daily on Days 2 through 5.     dexlansoprazole 60 MG capsule  Commonly known as:  DEXILANT  Take 1 capsule (60 mg total) by mouth daily.     Eszopiclone 3 MG Tabs  TAKE 1 TABLET  BY MOUTH AT BEDTIME AS NEEDED     furosemide 40 MG tablet  Commonly known as:  LASIX  Take 1 tablet (40 mg total) by mouth daily.     gabapentin 600 MG tablet  Commonly known as:  NEURONTIN  Take 1 tablet 4 x daily as needed for pain or cramps     HYDROcodone-acetaminophen 7.5-325 MG per tablet  Commonly known as:  NORCO  Take 1 tablet by mouth every 6 (six) hours as needed (PAIN).     KLOR-CON M10 10 MEQ tablet  Generic drug:  potassium chloride  TAKE 1 TABLET BY MOUTH TWICE A DAY     loperamide 2 MG capsule  Commonly known as:  IMODIUM  Take 2 mg by mouth 4 (four) times daily as needed for diarrhea or loose stools.     morphine 15 MG 12 hr tablet  Commonly known as:  MS CONTIN  Take 1 tablet (15 mg total) by mouth 2 (two) times daily.     multivitamin with minerals Tabs tablet  Take 1 tablet by mouth daily.     ondansetron 4 MG disintegrating tablet  Commonly known as:  ZOFRAN-ODT  Take 1 tablet (4 mg total) by mouth every 8 (eight) hours  as needed for nausea.     PRESCRIPTION MEDICATION  carfilzomib (KYPROLIS) 56 mg in dextrose 5 % 50 mL chemo infusion 27 mg/m2  2.07 m2 (Treatment Plan Actual) Once 08/17/2013     SYNTHROID 100 MCG tablet  Generic drug:  levothyroxine  TAKE 1 TABLET BY MOUTH EVERY DAY         No Known Allergies  PMHx:   Past Medical History  Diagnosis Date  . Thyroid disease Hypothyroidism  . Hypercholesterolemia   . Compression fracture 04/08/2007    pathologic compression fracture  . Hypothyroidism   . FHx: chemotherapy     s/p 5 cycle revlimid/low dose decadron,s/p velcade,doxil,decadron,  . Hx of radiation therapy 05/05/07-05/18/07,& 03/05/11-03/21/11-    l3&l5 in 2008, t2-t6 03/2011  . GERD (gastroesophageal reflux disease)   . Insomnia     associated with steroids  . Nausea   . Constipation     takes oxycontin,vicodin  . Hx of radiation therapy 05/05/2007 to 05/18/2007    palliative, L3-5  . 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 Hx  of radiation therapy 03/05/2011 to 03/21/2011    palliative T2-T6, c-spine  . 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 PONV (postoperative nausea and vomiting)   . Metastasis to bone     FHx:    Reviewed / unchanged  SHx:    Reviewed / unchanged   Systems Review: Constitutional: Denies fever, chills, wt changes, headaches, insomnia, fatigue, night sweats, change in appetite. Eyes: Denies redness, blurred vision, diplopia, discharge, itchy, watery eyes.  ENT: Denies discharge, congestion, post nasal drip, epistaxis, earache, hearing loss, dental pain, tinnitus, vertigo, sinus pain, snoring.   C/o 2 - 3 day Hx/o sore throat. CV: Denies chest pain, palpitations, irregular heartbeat, syncope, dyspnea, diaphoresis, orthopnea, PND, claudication or edema. Respiratory: denies cough, dyspnea, DOE, pleurisy, hoarseness, laryngitis, wheezing.  Gastrointestinal: Denies dysphagia, odynophagia, heartburn, reflux, water brash, abdominal pain or cramps, nausea, vomiting, bloating, diarrhea, constipation, hematemesis, melena, hematochezia  or hemorrhoids. Genitourinary: Denies dysuria, frequency, urgency, nocturia, hesitancy, discharge, hematuria or flank pain. Musculoskeletal: Denies arthralgias, myalgias, stiffness, jt. swelling, pain, limping or strain/sprain.  Skin: Denies pruritus, rash, hives, warts, acne, eczema or change in skin lesion(s). Neuro: No weakness, tremor, incoordination, spasms, paresthesia or pain. Psychiatric: Denies confusion, memory loss or sensory loss. Endo: Denies change in weight, skin or hair change.  Heme/Lymph: No excessive bleeding, bruising or enlarged lymph nodes.   Exam:  BP 124/70  Pulse 88  Temp(Src) 97 F (36.1 C) (Temporal)  Resp 16  Ht 5' 4.75" (1.645 m)  Wt 195 lb 9.6 oz (88.724 kg)  BMI 32.79 kg/m2  Appears well nourished - in no distress. Eyes: PERRLA, EOMs, conjunctiva no swelling or erythema. Sinuses: No  frontal/maxillary tenderness ENT/Mouth: EAC's clear, TM's nl w/o erythema, bulging. Nares clear w/o erythema, swelling, exudates. Oropharynx clear with 1-2 (+)  erythema but no exudates. Oral hygiene is good. Tongue normal, non obstructing. Hearing intact.  Neck: Supple. Thyroid nl. Car 2+/2+ without bruits, nodes or JVD. Chest: Respirations nl with BS clear & equal w/o rales, rhonchi, wheezing or stridor.  Cor: Heart sounds normal w/ regular rate and rhythm without sig. murmurs, gallops, clicks, or rubs. Peripheral pulses normal and equal  without edema.  Abdomen: Soft & bowel sounds normal. Non-tender w/o guarding, rebound, hernias, masses, or organomegaly.  Lymphatics: Unremarkable.  Musculoskeletal: Full ROM all peripheral extremities, joint stability, 5/5 strength, and normal gait.  Skin: Warm, dry without exposed rashes, lesions or ecchymosis apparent.  Neuro: Cranial nerves intact, reflexes equal bilaterally. Sensory-motor testing grossly intact. Tendon reflexes grossly intact.  Pysch: Alert & oriented x 3. Insight and judgement nl & appropriate. No ideations.  Assessment and Plan:  1. Hypertension - Continue monitor blood pressure at home. Continue diet/meds same.  2. Hyperlipidemia - Continue diet/meds, exercise,& lifestyle modifications. Continue monitor periodic cholesterol/liver & renal functions   3. T2 NIDDM w/Stage 3 CKD (GFR 51 ml/min) - continue recommend prudent low glycemic diet, weight control, regular exercise, diabetic monitoring and periodic eye exams.  4. Vitamin D Deficiency - Continue supplementation.  5. MM, S/P Autologous SCT  6. Pharyngitis - treat empirically with Z Pak  Recommended regular exercise, BP monitoring, weight control, and discussed med and SE's. Recommended labs to assess and monitor clinical status. Further disposition pending results of labs.

## 2013-11-04 NOTE — Patient Instructions (Signed)

## 2013-11-05 LAB — HEPATIC FUNCTION PANEL
ALBUMIN: 3.6 g/dL (ref 3.5–5.2)
ALT: 9 U/L (ref 0–35)
AST: 12 U/L (ref 0–37)
Alkaline Phosphatase: 59 U/L (ref 39–117)
BILIRUBIN DIRECT: 0.2 mg/dL (ref 0.0–0.3)
Indirect Bilirubin: 0.6 mg/dL (ref 0.2–1.2)
Total Bilirubin: 0.8 mg/dL (ref 0.2–1.2)
Total Protein: 6.6 g/dL (ref 6.0–8.3)

## 2013-11-05 LAB — LIPID PANEL
CHOL/HDL RATIO: 2.6 ratio
CHOLESTEROL: 87 mg/dL (ref 0–200)
HDL: 34 mg/dL — ABNORMAL LOW (ref >39)
LDL Cholesterol: 33 mg/dL (ref 0–99)
Triglycerides: 98 mg/dL (ref <150)
VLDL: 20 mg/dL (ref 0–40)

## 2013-11-05 LAB — BASIC METABOLIC PANEL WITH GFR
BUN: 7 mg/dL (ref 6–23)
CO2: 27 mEq/L (ref 19–32)
CREATININE: 0.78 mg/dL (ref 0.50–1.10)
Calcium: 7.9 mg/dL — ABNORMAL LOW (ref 8.4–10.5)
Chloride: 103 mEq/L (ref 96–112)
GFR, Est Non African American: 82 mL/min
Glucose, Bld: 129 mg/dL — ABNORMAL HIGH (ref 70–99)
Potassium: 3.4 mEq/L — ABNORMAL LOW (ref 3.5–5.3)
SODIUM: 139 meq/L (ref 135–145)

## 2013-11-05 LAB — INSULIN, FASTING: INSULIN FASTING, SERUM: 104 u[IU]/mL — AB (ref 3–28)

## 2013-11-05 LAB — VITAMIN D 25 HYDROXY (VIT D DEFICIENCY, FRACTURES): VIT D 25 HYDROXY: 61 ng/mL (ref 30–89)

## 2013-11-05 LAB — MAGNESIUM: Magnesium: 1.2 mg/dL — ABNORMAL LOW (ref 1.5–2.5)

## 2013-11-05 LAB — TSH: TSH: 0.476 u[IU]/mL (ref 0.350–4.500)

## 2013-11-16 ENCOUNTER — Ambulatory Visit
Admission: RE | Admit: 2013-11-16 | Discharge: 2013-11-16 | Disposition: A | Discharge status: Home / Self Care | Payer: Medicare Other | Source: Ambulatory Visit

## 2013-11-16 DIAGNOSIS — Z1231 Encounter for screening mammogram for malignant neoplasm of breast: Secondary | ICD-10-CM

## 2013-11-17 ENCOUNTER — Other Ambulatory Visit: Payer: Self-pay | Admitting: Internal Medicine

## 2013-11-17 MED ORDER — CELECOXIB 200 MG PO CAPS: 200.0000 mg | ORAL_CAPSULE | Freq: Every day | ORAL | Status: DC

## 2013-11-18 ENCOUNTER — Encounter: Payer: Self-pay | Admitting: Internal Medicine

## 2013-11-18 ENCOUNTER — Other Ambulatory Visit: Payer: Self-pay | Admitting: *Deleted

## 2013-11-18 ENCOUNTER — Other Ambulatory Visit (HOSPITAL_BASED_OUTPATIENT_CLINIC_OR_DEPARTMENT_OTHER): Payer: Medicare Other

## 2013-11-18 DIAGNOSIS — C9000 Multiple myeloma not having achieved remission: Secondary | ICD-10-CM

## 2013-11-18 DIAGNOSIS — C9002 Multiple myeloma in relapse: Secondary | ICD-10-CM

## 2013-11-18 LAB — COMPREHENSIVE METABOLIC PANEL (CC13)
ALT: 14 U/L (ref 0–55)
ANION GAP: 11 meq/L (ref 3–11)
AST: 16 U/L (ref 5–34)
Albumin: 3.4 g/dL — ABNORMAL LOW (ref 3.5–5.0)
Alkaline Phosphatase: 85 U/L (ref 40–150)
BUN: 7.9 mg/dL (ref 7.0–26.0)
CHLORIDE: 110 meq/L — AB (ref 98–109)
CO2: 23 meq/L (ref 22–29)
CREATININE: 0.8 mg/dL (ref 0.6–1.1)
Calcium: 9.1 mg/dL (ref 8.4–10.4)
Glucose: 119 mg/dl (ref 70–140)
Potassium: 3.6 mEq/L (ref 3.5–5.1)
SODIUM: 143 meq/L (ref 136–145)
TOTAL PROTEIN: 7 g/dL (ref 6.4–8.3)
Total Bilirubin: 0.63 mg/dL (ref 0.20–1.20)

## 2013-11-18 LAB — CBC WITH DIFFERENTIAL/PLATELET
BASO%: 0.3 % (ref 0.0–2.0)
Basophils Absolute: 0 10*3/uL (ref 0.0–0.1)
EOS%: 2.8 % (ref 0.0–7.0)
Eosinophils Absolute: 0.1 10*3/uL (ref 0.0–0.5)
HEMATOCRIT: 31.8 % — AB (ref 34.8–46.6)
HGB: 10.1 g/dL — ABNORMAL LOW (ref 11.6–15.9)
LYMPH#: 1.2 10*3/uL (ref 0.9–3.3)
LYMPH%: 37.8 % (ref 14.0–49.7)
MCH: 31.1 pg (ref 25.1–34.0)
MCHC: 31.8 g/dL (ref 31.5–36.0)
MCV: 97.8 fL (ref 79.5–101.0)
MONO#: 0.3 10*3/uL (ref 0.1–0.9)
MONO%: 8.8 % (ref 0.0–14.0)
NEUT#: 1.6 10*3/uL (ref 1.5–6.5)
NEUT%: 50.3 % (ref 38.4–76.8)
Platelets: 237 10*3/uL (ref 145–400)
RBC: 3.25 10*6/uL — AB (ref 3.70–5.45)
RDW: 15.3 % — ABNORMAL HIGH (ref 11.2–14.5)
WBC: 3.2 10*3/uL — ABNORMAL LOW (ref 3.9–10.3)

## 2013-11-18 LAB — LACTATE DEHYDROGENASE (CC13): LDH: 183 U/L (ref 125–245)

## 2013-11-18 MED ORDER — HYDROCODONE-ACETAMINOPHEN 7.5-325 MG PO TABS: 1.0000 | ORAL_TABLET | Freq: Four times a day (QID) | ORAL | Status: DC | PRN

## 2013-11-18 MED ORDER — MORPHINE SULFATE ER 15 MG PO TBCR: 15.0000 mg | EXTENDED_RELEASE_TABLET | Freq: Two times a day (BID) | ORAL | Status: DC

## 2013-11-18 NOTE — Progress Notes (Signed)
Patient came in to advise she will be renting thru [employer]. I called and left a message for [facility] [phone]. I will need a copy of their w-9 and the lease agreement.

## 2013-11-23 LAB — KAPPA/LAMBDA LIGHT CHAINS
KAPPA FREE LGHT CHN: 2.8 mg/dL — AB (ref 0.33–1.94)
Kappa:Lambda Ratio: 0.14 — ABNORMAL LOW (ref 0.26–1.65)
Lambda Free Lght Chn: 19.4 mg/dL — ABNORMAL HIGH (ref 0.57–2.63)

## 2013-11-23 LAB — IGG, IGA, IGM
IGA: 110 mg/dL (ref 69–380)
IGG (IMMUNOGLOBIN G), SERUM: 1400 mg/dL (ref 690–1700)
IGM, SERUM: 23 mg/dL — AB (ref 52–322)

## 2013-11-23 LAB — BETA 2 MICROGLOBULIN, SERUM: Beta-2 Microglobulin: 2.69 mg/L — ABNORMAL HIGH (ref <=2.51)

## 2013-11-25 ENCOUNTER — Other Ambulatory Visit: Payer: Self-pay | Admitting: Internal Medicine

## 2013-11-25 ENCOUNTER — Encounter: Payer: Self-pay | Admitting: Internal Medicine

## 2013-11-25 ENCOUNTER — Telehealth: Payer: Self-pay | Admitting: Internal Medicine

## 2013-11-25 ENCOUNTER — Ambulatory Visit (HOSPITAL_BASED_OUTPATIENT_CLINIC_OR_DEPARTMENT_OTHER): Payer: Medicare Other | Admitting: Internal Medicine

## 2013-11-25 VITALS — BP 118/65 | HR 80 | Temp 98.6°F | Resp 18 | Ht 64.75 in | Wt 196.9 lb

## 2013-11-25 DIAGNOSIS — C9000 Multiple myeloma not having achieved remission: Secondary | ICD-10-CM

## 2013-11-25 MED ORDER — POMALIDOMIDE 4 MG PO CAPS: 4.0000 mg | ORAL_CAPSULE | Freq: Every day | ORAL | Status: DC

## 2013-11-25 MED ORDER — DEXAMETHASONE 4 MG PO TABS: ORAL_TABLET | ORAL | Status: DC

## 2013-11-25 NOTE — Progress Notes (Signed)
[place] [phone]   Fax:([phone] [name] [place] [address]  DIAGNOSIS: Recurrent multiple myeloma initially diagnosed in October 2008.   PRIOR THERAPY:  1. Status post palliative radiotherapy to the lumbar spine between L3 and L5. The patient received a total dose of 3000 cGy in 10 fractions under the care of Dr. Lisbeth Renshaw between May 05, 2007 through May 18, 2007. 2. Status post 5 cycles of systemic chemotherapy with Revlimid and low-dose Decadron with good response to this treatment. 3. Status post autologous peripheral blood stem cell transplant at [facility] on Nov 20, 2007 under the care of Dr. Valarie Merino. 4. Status post treatment for disease recurrence with Velcade, Doxil and Decadron. Last dose given Nov 09, 2009. Discontinued secondary to intolerance but the patient had a good response to treatment at that time. 5. Status post palliative radiotherapy to the T2-T6 thoracic vertebrae completed 03/21/2011 under the care of Dr. Lisbeth Renshaw. 6. Systemic chemotherapy with Velcade 1.3 mg per meter squared given on days 1, 4, 8 and 11, and Doxil at 30 mg per meter squared given on day 4 in addition to Decadron status post 4 cycles, discontinued secondary to intolerance. 7. Systemic therapy with Velcade 1.3 mg/M2 subcutaneously in addition to Decadron 40 mg by mouth on a weekly basis, status post 20 cycles. The patient had good response with this treatment but it is discontinue today secondary to worsening peripheral neuropathy. 8. Palliative radiotherapy to the skull lesion as well as the left hip area under the care of Dr. Lisbeth Renshaw. 9. Systemic chemotherapy with Carfilzomib 20 mg/M2 on days 1, 2,  8, 9, 13 and 16 every 4 weeks in addition to weekly Decadron 40 mg by mouth. First dose on 04/19/2013. Status post 4 cycles, discontinued recently secondary to cardiac  dysfunction.  CURRENT THERAPY:  1. Pomalyst 4 mg by mouth daily for 21 days every 4 weeks in addition to dexamethasone 40 mg on a weekly basis. She is expected to start the first dose of systemic treatment in a few days. 2. Zometa 4 mg IV given every 3 months   INTERVAL HISTORY: [name] 63 y.o. female returns to the clinic today for follow up visit accompanied by her daughter. The patient is feeling fine today with no specific complaints except for occasional pain in the abdomen that is usually diffuse and resolves spontaneously. She has been on observation for the last 3 months. She denied having any significant fatigue or weakness. The patient denied having any significant chest pain, shortness of breath, cough or hemoptysis. She has no nausea or vomiting. She has no weight loss or night sweats. She continues to have mild peripheral neuropathy. The patient has repeat myeloma panel performed recently and she is here for evaluation and discussion of her lab results.  MEDICAL HISTORY: Past Medical History  Diagnosis Date  . Thyroid disease Hypothyroidism  . Hypercholesterolemia   . Compression fracture 04/08/2007    pathologic compression fracture  . Hypothyroidism   . FHx: chemotherapy     s/p 5 cycle revlimid/low dose decadron,s/p velcade,doxil,decadron,  . Hx of radiation therapy 05/05/07-05/18/07,& 03/05/11-03/21/11-    l3&l5 in 2008, t2-t6 03/2011  . GERD (gastroesophageal reflux disease)   . Insomnia     associated with steroids  . Nausea   . Constipation     takes oxycontin,vicodin  . Hx of radiation therapy 05/05/2007 to 05/18/2007  palliative, L3-5  . 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 Hx of radiation therapy 03/05/2011 to 03/21/2011    palliative T2-T6, c-spine  . 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 PONV (postoperative nausea and vomiting)   . Metastasis to bone     ALLERGIES:  has No Known Allergies.  MEDICATIONS:   Current Outpatient Prescriptions  Medication Sig Dispense Refill  . ALPRAZolam (XANAX) 0.5 MG tablet Take 1 tablet (0.5 mg total) by mouth 3 (three) times daily as needed for anxiety.  30 tablet  0  . atorvastatin (LIPITOR) 40 MG tablet TAKE 1 TABLET (40 MG TOTAL) BY MOUTH DAILY.  30 tablet  3  . azithromycin (ZITHROMAX) 250 MG tablet Take 2 tablets (500 mg) on  Day 1,  followed by 1 tablet (250 mg) once daily on Days 2 through 5.  6 each  1  . celecoxib (CELEBREX) 200 MG capsule Take 1 capsule (200 mg total) by mouth daily. With food for pain  30 capsule  99  . dexlansoprazole (DEXILANT) 60 MG capsule Take 1 capsule (60 mg total) by mouth daily.  30 capsule  6  . Eszopiclone 3 MG TABS TAKE 1 TABLET BY MOUTH AT BEDTIME AS NEEDED  30 tablet  5  . furosemide (LASIX) 40 MG tablet Take 1 tablet (40 mg total) by mouth daily.  30 tablet  0  . gabapentin (NEURONTIN) 600 MG tablet Take 1 tablet 4 x daily as needed for pain or cramps  120 tablet  6  . HYDROcodone-acetaminophen (NORCO) 7.5-325 MG per tablet Take 1 tablet by mouth every 6 (six) hours as needed (PAIN).  30 tablet  0  . KLOR-CON M10 10 MEQ tablet TAKE 1 TABLET BY MOUTH TWICE A DAY  60 tablet  3  . loperamide (IMODIUM) 2 MG capsule Take 2 mg by mouth 4 (four) times daily as needed for diarrhea or loose stools.      Marland Kitchen morphine (MS CONTIN) 15 MG 12 hr tablet Take 1 tablet (15 mg total) by mouth 2 (two) times daily.  60 tablet  0  . Multiple Vitamin (MULITIVITAMIN WITH MINERALS) TABS Take 1 tablet by mouth daily.      . ondansetron (ZOFRAN-ODT) 4 MG disintegrating tablet Take 1 tablet (4 mg total) by mouth every 8 (eight) hours as needed for nausea.  20 tablet  0  . PRESCRIPTION MEDICATION carfilzomib (KYPROLIS) 56 mg in dextrose 5 % 50 mL chemo infusion 27 mg/m2  2.07 m2 (Treatment Plan Actual) Once 08/17/2013      . SYNTHROID 100 MCG tablet TAKE 1 TABLET BY MOUTH EVERY DAY  90 tablet  1   No current facility-administered medications for this  visit.    SURGICAL HISTORY:  Past Surgical History  Procedure Laterality Date  . Cholecystectomy    . Knee surgery    . Knee surgery    . Video bronchoscopy  07/30/2011    Procedure: VIDEO BRONCHOSCOPY WITHOUT FLUORO;  Surgeon: Kathee Delton, MD;  Location: [facility];  Service: Cardiopulmonary;  Laterality: Bilateral;    REVIEW OF SYSTEMS:  Constitutional: negative Eyes: negative Ears, nose, mouth, throat, and face: negative Respiratory: negative Cardiovascular: negative Gastrointestinal: negative Genitourinary:negative Integument/breast: negative Hematologic/lymphatic: negative Musculoskeletal:positive for back pain Neurological: positive for seizures Behavioral/Psych: negative Endocrine: negative Allergic/Immunologic: negative   PHYSICAL EXAMINATION: General appearance: alert, cooperative, fatigued and no distress Head: Normocephalic, without obvious abnormality, atraumatic Neck: no adenopathy, no  JVD, supple, symmetrical, trachea midline and thyroid not enlarged, symmetric, no tenderness/mass/nodules Lymph nodes: Cervical, supraclavicular, and axillary nodes normal. Resp: clear to auscultation bilaterally Back: symmetric, no curvature. ROM normal. No CVA tenderness. Cardio: regular rate and rhythm, S1, S2 normal, no murmur, click, rub or gallop GI: soft, non-tender; bowel sounds normal; no masses,  no organomegaly Extremities: extremities normal, atraumatic, no cyanosis or edema Neurologic: Alert and oriented X 3, normal strength and tone. Normal symmetric reflexes. Normal coordination and gait  ECOG PERFORMANCE STATUS: 1 - Symptomatic but completely ambulatory  Blood pressure 118/65, pulse 80, temperature 98.6 F (37 C), temperature source Oral, resp. rate 18, height 5' 4.75" (1.645 m), weight 196 lb 14.4 oz (89.313 kg), SpO2 100.00%.  LABORATORY DATA: Lab Results  Component Value Date   WBC 3.2* 11/18/2013   HGB 10.1* 11/18/2013   HCT 31.8* 11/18/2013   MCV 97.8  11/18/2013   PLT 237 11/18/2013      Chemistry      Component Value Date/Time   NA 143 11/18/2013 1016   NA 139 11/04/2013 1100   K 3.6 11/18/2013 1016   K 3.4* 11/04/2013 1100   CL 103 11/04/2013 1100   CL 105 12/17/2012 1015   CO2 23 11/18/2013 1016   CO2 27 11/04/2013 1100   BUN 7.9 11/18/2013 1016   BUN 7 11/04/2013 1100   CREATININE 0.8 11/18/2013 1016   CREATININE 0.78 11/04/2013 1100   CREATININE 0.59 09/02/2013 1250      Component Value Date/Time   CALCIUM 9.1 11/18/2013 1016   CALCIUM 7.9* 11/04/2013 1100   ALKPHOS 85 11/18/2013 1016   ALKPHOS 59 11/04/2013 1100   AST 16 11/18/2013 1016   AST 12 11/04/2013 1100   ALT 14 11/18/2013 1016   ALT 9 11/04/2013 1100   BILITOT 0.63 11/18/2013 1016   BILITOT 0.8 11/04/2013 1100     Other lab results: Beta-2 microglobulin 2.69, free kappa light chain 2.80, free lambda light chain 19.40, kappa/lambda ratio 0.14, IgG 1400, IgA 110 and IgM 23.  RADIOGRAPHIC STUDIES:  ASSESSMENT AND PLAN:  This is a very pleasant 63 years old [place] American female with recurrent multiple myeloma recently completed a course of treatment with Velcade and Decadron with improvement in her disease but this was discontinued secondary to peripheral neuropathy. The patient is tolerating her treatment with Carfilzomib and Decadron fairly well except for the recent shortness breath and cough which was felt to be secondary to congestive heart failure from her treatment with Carfilzomib. The patient has been off treatment for the last 3 months and repeat myeloma panel showed stable disease except for elevation of the free lambda light chain showed further increase to 19.40 I had a lengthy discussion with the patient and her family today about her current disease status and treatment options. I gave her the option of treatment with Pomalyst and the Decadron versus referred to Dr. Evelene Croon at [facility] for enrollment in a clinical trial. The patient is interested in receiving local  treatment. I recommended for her treatment with Pomalyst milligram by mouth daily for 21 days every 28 days in addition to Decadron 40 mg on a weekly basis. I discussed with her the adverse effects of this treatment and she would like to proceed with treatment as planned. She would come back for followup visit in 2 weeks for evaluation and management any adverse effect of her treatment. The patient will continue her current treatment with Zometa every 3 months as scheduled.  She was advised to call immediately if she has any concerning symptoms in the interval. The patient voices understanding of current disease status and treatment options and is in agreement with the current care plan.  All questions were answered. The patient knows to call the clinic with any problems, questions or concerns. We can certainly see the patient much sooner if necessary.  Disclaimer: This note was dictated with voice recognition software. Similar sounding words can inadvertently be transcribed and may not be corrected upon review.

## 2013-11-25 NOTE — Telephone Encounter (Signed)
gv pt appt schedule for june. message to [facility] re no availability and pt seeing AJ in 2wk. also when next zometa due. per 5/28 pof zometa q54mo - no expected/due date.

## 2013-11-25 NOTE — Patient Instructions (Signed)
Pomalidomide oral capsules  What is this medicine?  POMALIDOMIDE (pom a LID oh mide) is a chemotherapy drug used to treat multiple myeloma. It targets specific proteins within cancer cells and stops the cancer cell from growing. This medicine may be used for other purposes; ask your health care provider or pharmacist if you have questions. COMMON BRAND NAME(S): POMALYST  What should I tell my health care provider before I take this medicine?  They need to know if you have any of these conditions: -history of blood clots -irregular monthly periods or menstrual cycles -an unusual or allergic reaction to pomalidomide, other medicines, foods, dyes, or preservatives -pregnant or trying to get pregnant -breast-feeding  How should I use this medicine?  Take this medicine by mouth with a glass of water. Follow the directions on the prescription label. Take this medicine on an empty stomach, at least 2 hours before or 2 hours after a meal. Do not take with food. Do not cut, crush, or chew this medicine. Take your medicine at regular intervals. Do not take it more often than directed. Do not stop taking except on your doctor's advice. A special MedGuide will be given to you by the pharmacist with each prescription and refill. Be sure to read this information carefully each time. Talk to your pediatrician regarding the use of this medicine in children. Special care may be needed. Overdosage: If you think you've taken too much of this medicine contact a poison control center or emergency room at once. Overdosage: If you think you have taken too much of this medicine contact a poison control center or emergency room at once. NOTE: This medicine is only for you. Do not share this medicine with others.  What if I miss a dose?  If you miss a dose, take it as soon as you can. If your next dose is to be taken in less than 12 hours, then do not take the missed dose. Take the next dose at your regular time.  Do not take double or extra doses.  What may interact with this medicine?  This medicine may interact with the following medications: -amprenavir -boceprevir -carbamazepine -dalfopristin; quinupristin -delavirdine -enzalutamide -fosamprenavir -indinavir -isoniazid, INH -itraconazole -ketoconazole -nicardipine -rifampin -ritonavir -St. John's Wort, Hypericum perforatum -telaprevir -telithromycin -thiabendazole -tipranavir -tobacco (cigarettes) This list may not describe all possible interactions. Give your health care provider a list of all the medicines, herbs, non-prescription drugs, or dietary supplements you use. Also tell them if you smoke, drink alcohol, or use illegal drugs. Some items may interact with your medicine.  What should I watch for while using this medicine?  Visit your doctor for regular check ups. Tell your doctor or healthcare professional if your symptoms do not start to get better or if they get worse. You will need to have important blood work done while you are taking this medicine. This medicine is available only through a special program. Doctors, pharmacies, and patients must meet all of the conditions of the program. Your health care provider will help you get signed up with the program if you need this medicine. Through the program you will only receive up to a 28 day supply of the medicine at one time. You will need a new prescription for each refill. This medicine can cause birth defects. Do not get pregnant while taking this drug. Females with child-bearing potential will need to have 2 negative pregnancy tests before starting this medicine. Pregnancy testing must be done every 2 to 4  weeks as directed while taking this medicine. Use 2 reliable forms of birth control together while you are taking this medicine and for 1 month after you stop taking this medicine. If you think that you might be pregnant talk to your doctor right away. Men must use a latex  condom during sexual contact with a woman while taking this medicine and for 28 days after you stop taking this medicine. A latex condom is needed even if you have had a vasectomy. Contact your doctor right away if your partner becomes pregnant. Do not donate sperm while taking this medicine and for 28 days after you stop taking this medicine. Do not give blood while taking the medicine and for 1 month after completion of treatment to avoid exposing pregnant women to the medicine through the donated blood. You may need blood work done while you are taking this medicine. If you are going to have surgery or any other procedures, tell your doctor you are taking this medicine.  What side effects may I notice from receiving this medicine?  Side effects that you should report to your doctor or health care professional as soon as possible: -allergic reactions like skin rash, itching or hives, swelling of the face, lips, or tongue -bloody or dark, tarry stools -breathing problems -chest pain -confusion -dark urine -fever, infection, runny nose, or sore throat -nausea, vomiting -pain, tingling, numbness in the hands or feet -red spots on the skin -swelling of your hands, ankles or leg -trouble passing urine or change in the amount of urine -unusual bleeding or bruising  Side effects that usually do not require medical attention (Report these to your doctor or health care professional if they continue or are bothersome.): -constipation -diarrhea -dizziness -headache -joint pain -muscle pain -tiredness -trouble sleeping This list may not describe all possible side effects. Call your doctor for medical advice about side effects. You may report side effects to FDA at 1-800-FDA-1088.  Where should I keep my medicine?  Keep out of the reach of children. Store between 20 and 25 degrees C (68 and 77 degrees F). Throw away any unused medicine after the expiration date. NOTE: This sheet is a  summary. It may not cover all possible information. If you have questions about this medicine, talk to your doctor, pharmacist, or health care provider.  2014, Elsevier/Gold Standard. (2011-10-03 05:58:16)

## 2013-11-25 NOTE — Progress Notes (Signed)
Pomalyst physician agreement form faxed to [facility].  Consent form signed.  Rx for pomalyst given to [facility].  Auth # [id number]  Done 11/25/13. SLJ

## 2013-11-25 NOTE — Progress Notes (Signed)
Faxed pomalyst prescription to [facility]

## 2013-11-26 ENCOUNTER — Telehealth: Payer: Self-pay | Admitting: *Deleted

## 2013-11-26 NOTE — Telephone Encounter (Signed)
Patient was enrolled in [place] program.

## 2013-11-26 NOTE — Telephone Encounter (Signed)
[facility] faxed confirmation of facsimile receipt for Pomalyst prescription referral.  [facility] will verify insurance and make delivery arrangements with patient.

## 2013-11-26 NOTE — Telephone Encounter (Signed)
Form from the [facility] [place] [facility] Pt Enrollment Application Physician Information filled out, faxed back to [phone].  SLJ

## 2013-11-29 ENCOUNTER — Telehealth: Payer: Self-pay | Admitting: *Deleted

## 2013-11-29 NOTE — Telephone Encounter (Signed)
Received call from [place] at [facility] that hydrocodone is a category D medication if taken if pomalyst because it could cause CNS depression.  However, she only takes the medication 1x daily.  Informed pt that we will ask Dr Vista Mink to see if it is necessary to switch her hydrocodone to another pain medication since she only takes it 1x daily.  Asked Anderson Malta if oxycodone or ultram had the same warning and she said yes they are also category D.  Will forward msg to Dr Vista Mink and we will call her when he returns on 6/3.  Anderson Malta return ph# [phone] ext [phone]  SLJ

## 2013-11-30 ENCOUNTER — Encounter: Payer: Self-pay | Admitting: Internal Medicine

## 2013-11-30 NOTE — Progress Notes (Signed)
Patient left message. I called her back and had to leave her a message.

## 2013-12-01 ENCOUNTER — Telehealth: Payer: Self-pay | Admitting: Internal Medicine

## 2013-12-01 ENCOUNTER — Encounter: Payer: Self-pay | Admitting: Internal Medicine

## 2013-12-01 NOTE — Telephone Encounter (Signed)
PER RESPONSE FROM [facility] NEXT ZOMETA 6/11. ADDED INF TO 6/11 LB/FU. APPT START TIME HAS NOT CHANGED. LMONVM FOR PT.

## 2013-12-01 NOTE — Progress Notes (Signed)
Called [facility] and spoke with [facility]. She said patient id# [id number] and they are helping with [place](her insurance is paying 100% now) and her oral drug Pomalyst.

## 2013-12-02 ENCOUNTER — Telehealth: Payer: Self-pay | Admitting: Medical Oncology

## 2013-12-02 ENCOUNTER — Encounter: Payer: Self-pay | Admitting: *Deleted

## 2013-12-02 NOTE — Progress Notes (Signed)
RECEIVED A FAX FROM [facility] CONCERNING A CONFIRMATION OF PRESCRIPTION SHIPMENT FOR POMALYST ON 12/01/13

## 2013-12-02 NOTE — Telephone Encounter (Signed)
[name] received her Pomalyst today and I instructed her to take it. She also took her steroid.

## 2013-12-07 ENCOUNTER — Other Ambulatory Visit: Payer: Self-pay | Admitting: Internal Medicine

## 2013-12-09 ENCOUNTER — Encounter: Payer: Self-pay | Admitting: Physician Assistant

## 2013-12-09 ENCOUNTER — Telehealth: Payer: Self-pay | Admitting: Internal Medicine

## 2013-12-09 ENCOUNTER — Ambulatory Visit (HOSPITAL_BASED_OUTPATIENT_CLINIC_OR_DEPARTMENT_OTHER): Payer: Medicare Other

## 2013-12-09 ENCOUNTER — Ambulatory Visit (HOSPITAL_BASED_OUTPATIENT_CLINIC_OR_DEPARTMENT_OTHER): Payer: Medicare Other | Admitting: Physician Assistant

## 2013-12-09 ENCOUNTER — Other Ambulatory Visit (HOSPITAL_BASED_OUTPATIENT_CLINIC_OR_DEPARTMENT_OTHER): Payer: Medicare Other

## 2013-12-09 VITALS — BP 122/60 | HR 74 | Temp 98.5°F | Resp 18 | Ht 64.75 in | Wt 197.7 lb

## 2013-12-09 DIAGNOSIS — C9002 Multiple myeloma in relapse: Secondary | ICD-10-CM

## 2013-12-09 DIAGNOSIS — C9000 Multiple myeloma not having achieved remission: Secondary | ICD-10-CM

## 2013-12-09 LAB — COMPREHENSIVE METABOLIC PANEL (CC13)
ALBUMIN: 3.2 g/dL — AB (ref 3.5–5.0)
ALK PHOS: 99 U/L (ref 40–150)
ALT: 15 U/L (ref 0–55)
AST: 11 U/L (ref 5–34)
Anion Gap: 8 mEq/L (ref 3–11)
BUN: 12.4 mg/dL (ref 7.0–26.0)
CHLORIDE: 104 meq/L (ref 98–109)
CO2: 26 mEq/L (ref 22–29)
Calcium: 8.5 mg/dL (ref 8.4–10.4)
Creatinine: 0.8 mg/dL (ref 0.6–1.1)
Glucose: 120 mg/dl (ref 70–140)
POTASSIUM: 3.6 meq/L (ref 3.5–5.1)
SODIUM: 139 meq/L (ref 136–145)
TOTAL PROTEIN: 6.6 g/dL (ref 6.4–8.3)
Total Bilirubin: 0.8 mg/dL (ref 0.20–1.20)

## 2013-12-09 LAB — CBC WITH DIFFERENTIAL/PLATELET
BASO%: 0.3 % (ref 0.0–2.0)
Basophils Absolute: 0 10*3/uL (ref 0.0–0.1)
EOS%: 3.2 % (ref 0.0–7.0)
Eosinophils Absolute: 0.2 10*3/uL (ref 0.0–0.5)
HCT: 30.6 % — ABNORMAL LOW (ref 34.8–46.6)
HEMOGLOBIN: 10.1 g/dL — AB (ref 11.6–15.9)
LYMPH#: 0.9 10*3/uL (ref 0.9–3.3)
LYMPH%: 13.6 % — ABNORMAL LOW (ref 14.0–49.7)
MCH: 31.6 pg (ref 25.1–34.0)
MCHC: 33 g/dL (ref 31.5–36.0)
MCV: 95.6 fL (ref 79.5–101.0)
MONO#: 0.6 10*3/uL (ref 0.1–0.9)
MONO%: 9.3 % (ref 0.0–14.0)
NEUT#: 5 10*3/uL (ref 1.5–6.5)
NEUT%: 73.6 % (ref 38.4–76.8)
Platelets: 185 10*3/uL (ref 145–400)
RBC: 3.2 10*6/uL — AB (ref 3.70–5.45)
RDW: 14.8 % — AB (ref 11.2–14.5)
WBC: 6.8 10*3/uL (ref 3.9–10.3)

## 2013-12-09 LAB — LACTATE DEHYDROGENASE (CC13): LDH: 151 U/L (ref 125–245)

## 2013-12-09 MED ORDER — HEPARIN SOD (PORK) LOCK FLUSH 100 UNIT/ML IV SOLN
500.0000 [IU] | Freq: Once | INTRAVENOUS | Status: AC | PRN
  Administered 2013-12-09: 500 [IU]
  Filled 2013-12-09: qty 5

## 2013-12-09 MED ORDER — SODIUM CHLORIDE 0.9 % IV SOLN
Freq: Once | INTRAVENOUS | Status: AC
  Administered 2013-12-09: 16:00:00 via INTRAVENOUS

## 2013-12-09 MED ORDER — ZOLEDRONIC ACID 4 MG/100ML IV SOLN
4.0000 mg | Freq: Once | INTRAVENOUS | Status: AC
  Administered 2013-12-09: 4 mg via INTRAVENOUS
  Filled 2013-12-09: qty 100

## 2013-12-09 MED ORDER — SODIUM CHLORIDE 0.9 % IJ SOLN
10.0000 mL | INTRAMUSCULAR | Status: DC | PRN
  Administered 2013-12-09: 10 mL
  Filled 2013-12-09: qty 10

## 2013-12-09 NOTE — Progress Notes (Addendum)
[place] [phone]   [phone]  SHARED VISIT PROGRESS NOTE  [name], [address] [place] [address]  DIAGNOSIS: Recurrent multiple myeloma initially diagnosed in October 2008.   PRIOR THERAPY:  1. Status post palliative radiotherapy to the lumbar spine between L3 and L5. The patient received a total dose of 3000 cGy in 10 fractions under the care of Dr. Lisbeth Renshaw between May 05, 2007 through May 18, 2007. 2. Status post 5 cycles of systemic chemotherapy with Revlimid and low-dose Decadron with good response to this treatment. 3. Status post autologous peripheral blood stem cell transplant at [facility] on Nov 20, 2007 under the care of Dr. Valarie Merino. 4. Status post treatment for disease recurrence with Velcade, Doxil and Decadron. Last dose given Nov 09, 2009. Discontinued secondary to intolerance but the patient had a good response to treatment at that time. 5. Status post palliative radiotherapy to the T2-T6 thoracic vertebrae completed 03/21/2011 under the care of Dr. Lisbeth Renshaw. 6. Systemic chemotherapy with Velcade 1.3 mg per meter squared given on days 1, 4, 8 and 11, and Doxil at 30 mg per meter squared given on day 4 in addition to Decadron status post 4 cycles, discontinued secondary to intolerance. 7. Systemic therapy with Velcade 1.3 mg/M2 subcutaneously in addition to Decadron 40 mg by mouth on a weekly basis, status post 20 cycles. The patient had good response with this treatment but it is discontinue today secondary to worsening peripheral neuropathy. 8. Palliative radiotherapy to the skull lesion as well as the left hip area under the care of Dr. Lisbeth Renshaw. 9. Systemic chemotherapy with Carfilzomib 20 mg/M2 on days 1, 2,  8, 9, 13 and 16 every 4 weeks in addition to weekly Decadron 40 mg by mouth. First dose on 04/19/2013. Status post 4 cycles, discontinued recently secondary to cardiac  dysfunction.  CURRENT THERAPY:  1. Pomalyst 4 mg by mouth daily for 21 days every 4 weeks in addition to dexamethasone 40 mg on a weekly basis. Therapy beginning 12/02/2013  2. Zometa 4 mg IV given every 3 months   INTERVAL HISTORY: [name] 63 y.o. female returns to the clinic today for follow up visit accompanied by her daughter. The patient is feeling fine today with no specific complaints except for occasional pain in the abdomen that is usually diffuse and resolves spontaneously. Due to evidence of disease progression she has been started on therapy with Pomalyst plus dexamethasone. She started this therapy on 12/02/2013 and presents today for a symptom management visit. Thus far she is tolerating this therapy without difficulty.  She denied having any significant fatigue or weakness. The patient denied having any significant chest pain, shortness of breath, cough or hemoptysis. She has no nausea or vomiting. She has no weight loss or night sweats. She continues to have mild peripheral neuropathy.   MEDICAL HISTORY: Past Medical History  Diagnosis Date  . Thyroid disease Hypothyroidism  . Hypercholesterolemia   . Compression fracture 04/08/2007    pathologic compression fracture  . Hypothyroidism   . FHx: chemotherapy     s/p 5 cycle revlimid/low dose decadron,s/p velcade,doxil,decadron,  . Hx of radiation therapy 05/05/07-05/18/07,& 03/05/11-03/21/11-    l3&l5 in 2008, t2-t6 03/2011  . GERD (gastroesophageal reflux disease)   . Insomnia     associated with steroids  . Nausea   . Constipation     takes oxycontin,vicodin  . Hx of radiation therapy 05/05/2007 to  05/18/2007    palliative, L3-5  . 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 Hx of radiation therapy 03/05/2011 to 03/21/2011    palliative T2-T6, c-spine  . 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 PONV (postoperative nausea and vomiting)   . Metastasis to bone     ALLERGIES:  has No  Known Allergies.  MEDICATIONS:  Current Outpatient Prescriptions  Medication Sig Dispense Refill  . ALPRAZolam (XANAX) 0.5 MG tablet TAKE 1 TABLET 3 TIMES A DAY AS NEEDED FOR ANXIETY  30 tablet  3  . atorvastatin (LIPITOR) 40 MG tablet TAKE 1 TABLET (40 MG TOTAL) BY MOUTH DAILY.  30 tablet  3  . celecoxib (CELEBREX) 200 MG capsule Take 1 capsule (200 mg total) by mouth daily. With food for pain  30 capsule  99  . dexamethasone (DECADRON) 4 MG tablet 10 tab po every week. Start with first day of chemo  40 tablet  3  . dexlansoprazole (DEXILANT) 60 MG capsule Take 1 capsule (60 mg total) by mouth daily.  30 capsule  6  . Eszopiclone 3 MG TABS TAKE 1 TABLET BY MOUTH AT BEDTIME AS NEEDED  30 tablet  5  . furosemide (LASIX) 40 MG tablet Take 1 tablet (40 mg total) by mouth daily.  30 tablet  0  . gabapentin (NEURONTIN) 600 MG tablet Take 1 tablet 4 x daily as needed for pain or cramps  120 tablet  6  . HYDROcodone-acetaminophen (NORCO) 7.5-325 MG per tablet Take 1 tablet by mouth every 6 (six) hours as needed (PAIN).  30 tablet  0  . KLOR-CON M10 10 MEQ tablet TAKE 1 TABLET BY MOUTH TWICE A DAY  60 tablet  3  . loperamide (IMODIUM) 2 MG capsule Take 2 mg by mouth 4 (four) times daily as needed for diarrhea or loose stools.      Marland Kitchen morphine (MS CONTIN) 15 MG 12 hr tablet Take 1 tablet (15 mg total) by mouth 2 (two) times daily.  60 tablet  0  . Multiple Vitamin (MULITIVITAMIN WITH MINERALS) TABS Take 1 tablet by mouth daily.      . ondansetron (ZOFRAN-ODT) 4 MG disintegrating tablet Take 1 tablet (4 mg total) by mouth every 8 (eight) hours as needed for nausea.  20 tablet  0  . pomalidomide (POMALYST) 4 MG capsule Take 1 capsule (4 mg total) by mouth daily. Take with water on days 1-21. Repeat every 28 days.  21 capsule  2  . PRESCRIPTION MEDICATION carfilzomib (KYPROLIS) 56 mg in dextrose 5 % 50 mL chemo infusion 27 mg/m2  2.07 m2 (Treatment Plan Actual) Once 08/17/2013      . SYNTHROID 100 MCG tablet  TAKE 1 TABLET BY MOUTH EVERY DAY  90 tablet  1  . azithromycin (ZITHROMAX) 250 MG tablet Take 2 tablets (500 mg) on  Day 1,  followed by 1 tablet (250 mg) once daily on Days 2 through 5.  6 each  1   No current facility-administered medications for this visit.   Facility-Administered Medications Ordered in Other Visits  Medication Dose Route Frequency Provider Last Rate Last Dose  . sodium chloride 0.9 % injection 10 mL  10 mL Intracatheter PRN Curt Bears, MD   10 mL at 12/09/13 1559    SURGICAL HISTORY:  Past Surgical History  Procedure Laterality Date  . Cholecystectomy    . Knee surgery    . Knee surgery    .  Video bronchoscopy  07/30/2011    Procedure: VIDEO BRONCHOSCOPY WITHOUT FLUORO;  Surgeon: Kathee Delton, MD;  Location: [facility];  Service: Cardiopulmonary;  Laterality: Bilateral;    REVIEW OF SYSTEMS:  Constitutional: negative Eyes: negative Ears, nose, mouth, throat, and face: negative Respiratory: negative Cardiovascular: negative Gastrointestinal: negative Genitourinary:negative Integument/breast: negative Hematologic/lymphatic: negative Musculoskeletal:positive for back pain Neurological: positive for seizures Behavioral/Psych: negative Endocrine: negative Allergic/Immunologic: negative   PHYSICAL EXAMINATION: General appearance: alert, cooperative, fatigued and no distress Head: Normocephalic, without obvious abnormality, atraumatic Neck: no adenopathy, no JVD, supple, symmetrical, trachea midline and thyroid not enlarged, symmetric, no tenderness/mass/nodules Lymph nodes: Cervical, supraclavicular, and axillary nodes normal. Resp: clear to auscultation bilaterally Back: symmetric, no curvature. ROM normal. No CVA tenderness. Cardio: regular rate and rhythm, S1, S2 normal, no murmur, click, rub or gallop GI: soft, non-tender; bowel sounds normal; no masses,  no organomegaly Extremities: extremities normal, atraumatic, no cyanosis or  edema Neurologic: Alert and oriented X 3, normal strength and tone. Normal symmetric reflexes. Normal coordination and gait  ECOG PERFORMANCE STATUS: 1 - Symptomatic but completely ambulatory  Blood pressure 122/60, pulse 74, temperature 98.5 F (36.9 C), temperature source Oral, resp. rate 18, height 5' 4.75" (1.645 m), weight 197 lb 11.2 oz (89.676 kg), SpO2 100.00%.  LABORATORY DATA: Lab Results  Component Value Date   WBC 6.8 12/09/2013   HGB 10.1* 12/09/2013   HCT 30.6* 12/09/2013   MCV 95.6 12/09/2013   PLT 185 12/09/2013      Chemistry      Component Value Date/Time   NA 139 12/09/2013 1352   NA 139 11/04/2013 1100   K 3.6 12/09/2013 1352   K 3.4* 11/04/2013 1100   CL 103 11/04/2013 1100   CL 105 12/17/2012 1015   CO2 26 12/09/2013 1352   CO2 27 11/04/2013 1100   BUN 12.4 12/09/2013 1352   BUN 7 11/04/2013 1100   CREATININE 0.8 12/09/2013 1352   CREATININE 0.78 11/04/2013 1100   CREATININE 0.59 09/02/2013 1250      Component Value Date/Time   CALCIUM 8.5 12/09/2013 1352   CALCIUM 7.9* 11/04/2013 1100   ALKPHOS 99 12/09/2013 1352   ALKPHOS 59 11/04/2013 1100   AST 11 12/09/2013 1352   AST 12 11/04/2013 1100   ALT 15 12/09/2013 1352   ALT 9 11/04/2013 1100   BILITOT 0.80 12/09/2013 1352   BILITOT 0.8 11/04/2013 1100     Other lab results: Beta-2 microglobulin 2.69, free kappa light chain 2.80, free lambda light chain 19.40, kappa/lambda ratio 0.14, IgG 1400, IgA 110 and IgM 23.  RADIOGRAPHIC STUDIES:  ASSESSMENT AND PLAN:  This is a very pleasant 63 years old [place] American female with recurrent multiple myeloma recently completed a course of treatment with Velcade and Decadron with improvement in her disease but this was discontinued secondary to peripheral neuropathy. The patient is tolerating her treatment with Carfilzomib and Decadron fairly well except for the recent shortness breath and cough which was felt to be secondary to congestive heart failure from her treatment with Carfilzomib. The  patient has been off treatment for the last 3 months and repeat myeloma panel showed stable disease except for elevation of the free lambda light chain showed further increase to 19.40 The patient was discussed with and also seen by Dr. Julien Nordmann. She will continue her treatment with Pomalyst milligram by mouth daily for 21 days every 28 days in addition to Decadron 40 mg on a weekly basis. She would followup in 2  weeks for another symptom management visit.   The patient will continue her current treatment with Zometa every 3 months as scheduled. She was advised to call immediately if she has any concerning symptoms in the interval. The patient voices understanding of current disease status and treatment options and is in agreement with the current care plan.  All questions were answered. The patient knows to call the clinic with any problems, questions or concerns. We can certainly see the patient much sooner if necessary.  Carlton Adam [name]  ADDENDUM: Hematology/Oncology Attending: I had a face to face encounter with the patient today. I recommended her care plan. This is a very pleasant 63 years old African American female with relapsed multiple myeloma currently on treatment with Pomalyst 4 mg by mouth daily for 21 days every 4 weeks in addition to Decadron 40 mg on a weekly basis. She status post 10 days of treatment and tolerating it fairly well. She denied having any significant nausea or vomiting, no fever or chills, no significant peripheral neuropathy except for her baseline neuropathy from prior treatment. I recommended for the patient to continue her current treatment as scheduled. She would come back for followup visit in 2 weeks for reevaluation. She will also continue on Zometa every 3 months. The patient was advised to call immediately if she has any concerning symptoms in the interval.  Disclaimer: This note was dictated with voice recognition software. Similar sounding words  can inadvertently be transcribed and may not be corrected upon review. Eilleen Kempf., MD 12/13/2013

## 2013-12-09 NOTE — Telephone Encounter (Signed)
Talked to pt gave her appt for 6/25 with Ml ,md full schedule on 6/25

## 2013-12-09 NOTE — Telephone Encounter (Signed)
emailed michelle regardingn Zometa

## 2013-12-09 NOTE — Patient Instructions (Signed)

## 2013-12-10 ENCOUNTER — Telehealth: Payer: Self-pay | Admitting: *Deleted

## 2013-12-10 NOTE — Telephone Encounter (Signed)
Per staff message and POF I have scheduled appts. Advised scheduler of appts. JMW  

## 2013-12-12 NOTE — Patient Instructions (Signed)
Continue Pomalyst and Dexamethasone as prescribed Follow up in 2 weeks

## 2013-12-18 ENCOUNTER — Encounter (HOSPITAL_COMMUNITY): Payer: Self-pay | Admitting: Emergency Medicine

## 2013-12-18 ENCOUNTER — Emergency Department (HOSPITAL_COMMUNITY): Payer: Medicare Other

## 2013-12-18 ENCOUNTER — Emergency Department (HOSPITAL_COMMUNITY)
Admission: EM | Admit: 2013-12-18 | Discharge: 2013-12-18 | Disposition: A | Discharge status: Home / Self Care | Payer: Medicare Other | Attending: Emergency Medicine | Admitting: Emergency Medicine

## 2013-12-18 DIAGNOSIS — Z923 Personal history of irradiation: Secondary | ICD-10-CM | POA: Insufficient documentation

## 2013-12-18 DIAGNOSIS — Z87898 Personal history of other specified conditions: Secondary | ICD-10-CM | POA: Insufficient documentation

## 2013-12-18 DIAGNOSIS — R0609 Other forms of dyspnea: Secondary | ICD-10-CM | POA: Insufficient documentation

## 2013-12-18 DIAGNOSIS — R110 Nausea: Secondary | ICD-10-CM | POA: Insufficient documentation

## 2013-12-18 DIAGNOSIS — R1013 Epigastric pain: Secondary | ICD-10-CM

## 2013-12-18 DIAGNOSIS — IMO0002 Reserved for concepts with insufficient information to code with codable children: Secondary | ICD-10-CM | POA: Insufficient documentation

## 2013-12-18 DIAGNOSIS — R0600 Dyspnea, unspecified: Secondary | ICD-10-CM

## 2013-12-18 DIAGNOSIS — Z9484 Stem cells transplant status: Secondary | ICD-10-CM | POA: Insufficient documentation

## 2013-12-18 DIAGNOSIS — F411 Generalized anxiety disorder: Secondary | ICD-10-CM | POA: Insufficient documentation

## 2013-12-18 DIAGNOSIS — Z79899 Other long term (current) drug therapy: Secondary | ICD-10-CM | POA: Insufficient documentation

## 2013-12-18 DIAGNOSIS — K219 Gastro-esophageal reflux disease without esophagitis: Secondary | ICD-10-CM | POA: Insufficient documentation

## 2013-12-18 DIAGNOSIS — E7800 Pure hypercholesterolemia, unspecified: Secondary | ICD-10-CM | POA: Insufficient documentation

## 2013-12-18 DIAGNOSIS — K5900 Constipation, unspecified: Secondary | ICD-10-CM | POA: Insufficient documentation

## 2013-12-18 DIAGNOSIS — E039 Hypothyroidism, unspecified: Secondary | ICD-10-CM | POA: Insufficient documentation

## 2013-12-18 DIAGNOSIS — Z87891 Personal history of nicotine dependence: Secondary | ICD-10-CM | POA: Insufficient documentation

## 2013-12-18 DIAGNOSIS — Z8739 Personal history of other diseases of the musculoskeletal system and connective tissue: Secondary | ICD-10-CM | POA: Insufficient documentation

## 2013-12-18 DIAGNOSIS — C7952 Secondary malignant neoplasm of bone marrow: Secondary | ICD-10-CM

## 2013-12-18 DIAGNOSIS — C7951 Secondary malignant neoplasm of bone: Secondary | ICD-10-CM | POA: Insufficient documentation

## 2013-12-18 DIAGNOSIS — R0989 Other specified symptoms and signs involving the circulatory and respiratory systems: Secondary | ICD-10-CM | POA: Insufficient documentation

## 2013-12-18 LAB — LIPASE, BLOOD: Lipase: 115 U/L — ABNORMAL HIGH (ref 11–59)

## 2013-12-18 LAB — URINALYSIS, ROUTINE W REFLEX MICROSCOPIC
Bilirubin Urine: NEGATIVE
Glucose, UA: NEGATIVE mg/dL
Ketones, ur: NEGATIVE mg/dL
Leukocytes, UA: NEGATIVE
Nitrite: NEGATIVE
Protein, ur: NEGATIVE mg/dL
SPECIFIC GRAVITY, URINE: 1.009 (ref 1.005–1.030)
Urobilinogen, UA: 0.2 mg/dL (ref 0.0–1.0)
pH: 6 (ref 5.0–8.0)

## 2013-12-18 LAB — COMPREHENSIVE METABOLIC PANEL
ALT: 10 U/L (ref 0–35)
AST: 9 U/L (ref 0–37)
Albumin: 2.8 g/dL — ABNORMAL LOW (ref 3.5–5.2)
Alkaline Phosphatase: 89 U/L (ref 39–117)
BILIRUBIN TOTAL: 0.6 mg/dL (ref 0.3–1.2)
BUN: 9 mg/dL (ref 6–23)
CHLORIDE: 103 meq/L (ref 96–112)
CO2: 21 mEq/L (ref 19–32)
Calcium: 8.2 mg/dL — ABNORMAL LOW (ref 8.4–10.5)
Creatinine, Ser: 0.79 mg/dL (ref 0.50–1.10)
GFR calc Af Amer: >90 mL/min (ref >90)
GFR, EST NON AFRICAN AMERICAN: 87 mL/min — AB (ref >90)
GLUCOSE: 107 mg/dL — AB (ref 70–99)
POTASSIUM: 3.9 meq/L (ref 3.7–5.3)
SODIUM: 137 meq/L (ref 137–147)
Total Protein: 6.6 g/dL (ref 6.0–8.3)

## 2013-12-18 LAB — CBC WITH DIFFERENTIAL/PLATELET
Basophils Absolute: 0 10*3/uL (ref 0.0–0.1)
Basophils Relative: 0 % (ref 0–1)
Eosinophils Absolute: 0.2 10*3/uL (ref 0.0–0.7)
Eosinophils Relative: 8 % — ABNORMAL HIGH (ref 0–5)
HCT: 29.8 % — ABNORMAL LOW (ref 36.0–46.0)
HEMOGLOBIN: 9.9 g/dL — AB (ref 12.0–15.0)
LYMPHS ABS: 0.5 10*3/uL — AB (ref 0.7–4.0)
LYMPHS PCT: 23 % (ref 12–46)
MCH: 31.4 pg (ref 26.0–34.0)
MCHC: 33.2 g/dL (ref 30.0–36.0)
MCV: 94.6 fL (ref 78.0–100.0)
MONOS PCT: 8 % (ref 3–12)
Monocytes Absolute: 0.2 10*3/uL (ref 0.1–1.0)
NEUTROS PCT: 61 % (ref 43–77)
Neutro Abs: 1.4 10*3/uL — ABNORMAL LOW (ref 1.7–7.7)
PLATELETS: 207 10*3/uL (ref 150–400)
RBC: 3.15 MIL/uL — AB (ref 3.87–5.11)
RDW: 15.2 % (ref 11.5–15.5)
WBC: 2.3 10*3/uL — AB (ref 4.0–10.5)

## 2013-12-18 LAB — URINE MICROSCOPIC-ADD ON

## 2013-12-18 MED ORDER — STERILE WATER FOR INJECTION IJ SOLN
INTRAMUSCULAR | Status: AC
  Administered 2013-12-18: 10 mL
  Filled 2013-12-18: qty 10

## 2013-12-18 MED ORDER — MORPHINE SULFATE 4 MG/ML IJ SOLN
4.0000 mg | INTRAMUSCULAR | Status: DC | PRN
  Administered 2013-12-18: 4 mg via INTRAVENOUS
  Filled 2013-12-18: qty 1

## 2013-12-18 MED ORDER — HYDROCODONE-ACETAMINOPHEN 5-325 MG PO TABS: 1.0000 | ORAL_TABLET | ORAL | Status: DC | PRN

## 2013-12-18 MED ORDER — HEPARIN SOD (PORK) LOCK FLUSH 100 UNIT/ML IV SOLN
500.0000 [IU] | Freq: Once | INTRAVENOUS | Status: AC
  Administered 2013-12-18: 500 [IU]
  Filled 2013-12-18: qty 5

## 2013-12-18 MED ORDER — IOHEXOL 300 MG/ML  SOLN
50.0000 mL | Freq: Once | INTRAMUSCULAR | Status: AC | PRN
  Administered 2013-12-18: 50 mL via ORAL

## 2013-12-18 MED ORDER — ONDANSETRON HCL 4 MG/2ML IJ SOLN
4.0000 mg | Freq: Once | INTRAMUSCULAR | Status: AC
  Administered 2013-12-18: 4 mg via INTRAVENOUS
  Filled 2013-12-18: qty 2

## 2013-12-18 MED ORDER — ONDANSETRON HCL 4 MG PO TABS: 4.0000 mg | ORAL_TABLET | Freq: Four times a day (QID) | ORAL | Status: DC

## 2013-12-18 MED ORDER — SODIUM CHLORIDE 0.9 % IV SOLN
Freq: Once | INTRAVENOUS | Status: AC
  Administered 2013-12-18: 11:00:00 via INTRAVENOUS

## 2013-12-18 MED ORDER — IOHEXOL 350 MG/ML SOLN
100.0000 mL | Freq: Once | INTRAVENOUS | Status: AC | PRN
  Administered 2013-12-18: 100 mL via INTRAVENOUS

## 2013-12-18 MED ORDER — HYDROMORPHONE HCL PF 1 MG/ML IJ SOLN
1.0000 mg | INTRAMUSCULAR | Status: DC | PRN
  Administered 2013-12-18: 1 mg via INTRAVENOUS
  Filled 2013-12-18: qty 1

## 2013-12-18 MED ORDER — FAMOTIDINE IN NACL 20-0.9 MG/50ML-% IV SOLN
20.0000 mg | Freq: Once | INTRAVENOUS | Status: AC
  Administered 2013-12-18: 20 mg via INTRAVENOUS
  Filled 2013-12-18: qty 50

## 2013-12-18 MED ORDER — SODIUM CHLORIDE 0.9 % IV BOLUS (SEPSIS)
1000.0000 mL | Freq: Once | INTRAVENOUS | Status: AC
  Administered 2013-12-18: 1000 mL via INTRAVENOUS

## 2013-12-18 NOTE — ED Notes (Signed)
Pt ambulatory with steady gait to void in BR.  Denies further needs/complaints at this time.  NAD.

## 2013-12-18 NOTE — ED Notes (Signed)
Pt ret from CT, assisted to use bathroom.  Denies further needs/complaints at this time.  Family at bedside.  NAD.

## 2013-12-18 NOTE — ED Provider Notes (Signed)
CSN: [id number]     Arrival date & time 12/18/13  1006 History   First MD Initiated Contact with Patient 12/18/13 1013     Chief Complaint  Patient presents with  . Abdominal Pain  . Shortness of Breath     HPI  Patient presents with complaint of shortness of breath, and abdominal pain. She is undergoing treatment for multiple myeloma. She had initial chemotherapy and radiation. She had bone marrow transplant. Had recurrence. Follow with Dr. Julien Nordmann. Receiving Zometa and Pomalyst.  Zometa is 3 weeks on one week off. Pomalyst is daily. Decadron 40 mg every Monday. Has had abdominal pain and nausea for months. Has intermittent episodes of dyspnea for months reports last night she couldn't sleep worsening nausea presents here. Pain is bilateral upper and bilateral quadrants. She puts her hand on her abdomen rubs or recovering cannot really localize a specific area. Nausea but no vomiting. No diarrhea. Having bowel movements. Shortness of breath awakens her from sleep.  Past Medical History  Diagnosis Date  . Thyroid disease Hypothyroidism  . Hypercholesterolemia   . Compression fracture 04/08/2007    pathologic compression fracture  . Hypothyroidism   . FHx: chemotherapy     s/p 5 cycle revlimid/low dose decadron,s/p velcade,doxil,decadron,  . Hx of radiation therapy 05/05/07-05/18/07,& 03/05/11-03/21/11-    l3&l5 in 2008, t2-t6 03/2011  . GERD (gastroesophageal reflux disease)   . Insomnia     associated with steroids  . Nausea   . Constipation     takes oxycontin,vicodin  . Hx of radiation therapy 05/05/2007 to 05/18/2007    palliative, L3-5  . 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 Hx of radiation therapy 03/05/2011 to 03/21/2011    palliative T2-T6, c-spine  . 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 PONV (postoperative nausea and vomiting)   . Metastasis to bone    Past Surgical History  Procedure Laterality Date  . Cholecystectomy    . Knee  surgery    . Knee surgery    . Video bronchoscopy  07/30/2011    Procedure: VIDEO BRONCHOSCOPY WITHOUT FLUORO;  Surgeon: Kathee Delton, MD;  Location: [facility];  Service: Cardiopulmonary;  Laterality: Bilateral;   Family History  Problem Relation Age of Onset  . Cancer Brother     lung ca  . Cancer Maternal Uncle     colon  . Cancer Maternal Grandmother     breast ca   History  Substance Use Topics  . Smoking status: Former Smoker -- 0.25 packs/day for 6 years    Quit date: 08/27/1978  . Smokeless tobacco: Never Used  . Alcohol Use: No   OB History   Grav Para Term Preterm Abortions TAB SAB Ect Mult Living                 Review of Systems  Constitutional: Positive for fatigue. Negative for fever, chills, diaphoresis and appetite change.  HENT: Negative for mouth sores, sore throat and trouble swallowing.   Eyes: Negative for visual disturbance.  Respiratory: Positive for shortness of breath. Negative for cough, chest tightness and wheezing.   Cardiovascular: Negative for chest pain.  Gastrointestinal: Positive for nausea and abdominal pain. Negative for vomiting, diarrhea and abdominal distention.  Endocrine: Negative for polydipsia, polyphagia and polyuria.  Genitourinary: Negative for dysuria, frequency and hematuria.  Musculoskeletal: Negative for gait problem.  Skin: Negative for color change, pallor and rash.  Neurological: Negative for dizziness,  syncope, light-headedness and headaches.  Hematological: Does not bruise/bleed easily.  Psychiatric/Behavioral: Negative for behavioral problems and confusion.      Allergies  Review of patient's allergies indicates no known allergies.  Home Medications   Prior to Admission medications   Medication Sig Start Date End Date Taking? Authorizing Provider  ALPRAZolam Duanne Moron) 0.5 MG tablet Take 0.5 mg by mouth 3 (three) times daily as needed for anxiety.   Yes Historical Provider, MD  atorvastatin (LIPITOR) 40 MG  tablet Take 40 mg by mouth every evening.   Yes Historical Provider, MD  celecoxib (CELEBREX) 200 MG capsule Take 200 mg by mouth daily as needed for mild pain.   Yes Historical Provider, MD  dexamethasone (DECADRON) 4 MG tablet Take 40 mg by mouth every Monday.   Yes Historical Provider, MD  dexlansoprazole (DEXILANT) 60 MG capsule Take 60 mg by mouth every morning.   Yes Historical Provider, MD  Eszopiclone 3 MG TABS Take 3 mg by mouth at bedtime as needed (sleep). Take immediately before bedtime   Yes Historical Provider, MD  furosemide (LASIX) 40 MG tablet Take 40 mg by mouth 2 (two) times daily.   Yes Historical Provider, MD  gabapentin (NEURONTIN) 600 MG tablet Take 600 mg by mouth 4 (four) times daily as needed (pain).   Yes Historical Provider, MD  HYDROcodone-acetaminophen (NORCO) 7.5-325 MG per tablet Take 1 tablet by mouth every 6 (six) hours as needed for moderate pain.   Yes Historical Provider, MD  levothyroxine (SYNTHROID, LEVOTHROID) 100 MCG tablet Take 50-100 mcg by mouth daily before breakfast. Take 0.5 tab (46mg) on Monday, Wednesday, and Friday. Take 1 tab (1067m) on Sunday, Tuesday, Thursday, and Saturday.   Yes Historical Provider, MD  loperamide (IMODIUM) 2 MG capsule Take 2-4 mg by mouth as needed for diarrhea or loose stools.    Yes Historical Provider, MD  magnesium gluconate (MAGONATE) 500 MG tablet Take 500 mg by mouth every morning.   Yes Historical Provider, MD  morphine (MS CONTIN) 15 MG 12 hr tablet Take 15 mg by mouth every 12 (twelve) hours.   Yes Historical Provider, MD  Multiple Vitamin (MULITIVITAMIN WITH MINERALS) TABS Take 1 tablet by mouth every morning.    Yes Historical Provider, MD  ondansetron (ZOFRAN-ODT) 4 MG disintegrating tablet Take 4 mg by mouth every 8 (eight) hours as needed for nausea or vomiting.   Yes Historical Provider, MD  pomalidomide (POMALYST) 4 MG capsule Take 4 mg by mouth daily. Take with water on days 1-21. Repeat every 28 days.   Yes  Historical Provider, MD  potassium chloride (K-DUR) 10 MEQ tablet Take 10 mEq by mouth 2 (two) times daily.   Yes Historical Provider, MD  HYDROcodone-acetaminophen (NORCO/VICODIN) 5-325 MG per tablet Take 1 tablet by mouth every 4 (four) hours as needed. 12/18/13   MaTanna FurryMD  ondansetron (ZOFRAN) 4 MG tablet Take 1 tablet (4 mg total) by mouth every 6 (six) hours. 12/18/13   MaTanna FurryMD   BP 112/60  Pulse 71  Temp(Src) 98.5 F (36.9 C) (Oral)  Resp 20  SpO2 98% Physical Exam  Constitutional: She is oriented to person, place, and time. She appears well-developed and well-nourished. No distress.  Anxious. Not dyspneic with conversation. Will intermittently stop his pursed lip breathing. Then she will stop, take a few deep breaths, then her breathing and conversation is without dyspnea.  HENT:  Head: Normocephalic.  Eyes: Conjunctivae are normal. Pupils are equal, round, and reactive to light.  No scleral icterus.  Neck: Normal range of motion. Neck supple. No thyromegaly present.  Cardiovascular: Normal rate and regular rhythm.  Exam reveals no gallop and no friction rub.   No murmur heard. Pulmonary/Chest: Effort normal and breath sounds normal. No respiratory distress. She has no wheezes. She has no rales.  Clear bilateral breath sounds  Abdominal: Soft. Bowel sounds are normal. She exhibits no distension. There is no tenderness. There is no rebound.  Abdomen soft nondistended nontender. No focal irritation or pain. No peritoneal irritation.  Musculoskeletal: Normal range of motion.  Neurological: She is alert and oriented to person, place, and time.  Skin: Skin is warm and dry. No rash noted.  Psychiatric: She has a normal mood and affect. Her behavior is normal.    ED Course  Procedures (including critical care time) Labs Review Labs Reviewed  CBC WITH DIFFERENTIAL - Abnormal; Notable for the following:    WBC 2.3 (*)    RBC 3.15 (*)    Hemoglobin 9.9 (*)    HCT 29.8 (*)     Neutro Abs 1.4 (*)    Lymphs Abs 0.5 (*)    Eosinophils Relative 8 (*)    All other components within normal limits  COMPREHENSIVE METABOLIC PANEL - Abnormal; Notable for the following:    Glucose, Bld 107 (*)    Calcium 8.2 (*)    Albumin 2.8 (*)    GFR calc non Af Amer 87 (*)    All other components within normal limits  LIPASE, BLOOD - Abnormal; Notable for the following:    Lipase 115 (*)    All other components within normal limits  URINALYSIS, ROUTINE W REFLEX MICROSCOPIC - Abnormal; Notable for the following:    Hgb urine dipstick TRACE (*)    All other components within normal limits  URINE MICROSCOPIC-ADD ON    Imaging Review Ct Angio Chest Pe W/cm &/or Wo Cm  12/18/2013   CLINICAL DATA:  Patient is currently on chemotherapy for multiple myeloma with shortness of breath, fatigue, and nausea  EXAM: CT ANGIOGRAPHY CHEST  CT ABDOMEN AND PELVIS WITH CONTRAST  TECHNIQUE: Multidetector CT imaging of the chest was performed using the standard protocol during bolus administration of intravenous contrast. Multiplanar CT image reconstructions and MIPs were obtained to evaluate the vascular anatomy. Multidetector CT imaging of the abdomen and pelvis was performed using the standard protocol during bolus administration of intravenous contrast.  CONTRAST:  18m OMNIPAQUE IOHEXOL 300 MG/ML SOLN, 108mOMNIPAQUE IOHEXOL 350 MG/ML SOLN  COMPARISON:  08/30/2013 CT thorax, 12/23/2012 CT abdomen.  FINDINGS: CTA CHEST FINDINGS  Lucent lesion L2 vertebral body on the left, L3 vertebral body posterior midline. Lucent lesion L4 vertebral body posteriorly on the right. Lucent lesion L5 vertebral body posteriorly, as well as the left lamina. T8 vertebral body small lucent lesion posteriorly on the right. These are all stable from prior CT scan.  Previous reticular nodular infiltrate superior segment right lower lobe not appreciated on the current study. Minimal nodularity medially in the superior segment of  the right lower lobe is stable and appears most consistent with scarring. Mild medial apical scarring is stable. No consolidation or pleural effusion. No pericardial effusion.  Mildly prominent thyroid bilaterally, stable. Numerous small nonpathologic mediastinal lymph nodes are stable. Thoracic aorta shows no dissection or dilatation. There are no filling defects in the pulmonary arterial system.  CT ABDOMEN and PELVIS FINDINGS  Status post cholecystectomy. Mild intrahepatic and extrahepatic biliary dilatation likely the result of  prior cholecystectomy. Liver is otherwise normal. Spleen is normal. Pancreas is normal.  Adrenal glands are normal.  Kidneys are normal.  Mild atherosclerotic calcification of the abdominal aorta.  No free fluid or significant adenopathy in the abdomen or pelvis. Bladder is normal. Reproductive organs appear to be absent. There is a nonobstructive bowel gas pattern. Appendix is normal. There are air-fluid levels throughout the entire colon.  There is an extensive lytic lesion in the L2 vertebral body. There is vertebra plana of L4 with mild retropulsion of the posterior cortex causing mild canal narrowing. These findings are unchanged from 12/23/2012.  Review of the MIP images confirms the above findings.  IMPRESSION: 1. Multiple lytic lesions consistent with myeloma tips involvement stable from prior studies. 2. No acute cardiopulmonary abnormality or pulmonary embolism. 3. No acute findings in the abdomen or pelvis.   Electronically Signed   By: Skipper Cliche M.D.   On: 12/18/2013 12:59   Ct Abdomen Pelvis W Contrast  12/18/2013   CLINICAL DATA:  Patient is currently on chemotherapy for multiple myeloma with shortness of breath, fatigue, and nausea  EXAM: CT ANGIOGRAPHY CHEST  CT ABDOMEN AND PELVIS WITH CONTRAST  TECHNIQUE: Multidetector CT imaging of the chest was performed using the standard protocol during bolus administration of intravenous contrast. Multiplanar CT image  reconstructions and MIPs were obtained to evaluate the vascular anatomy. Multidetector CT imaging of the abdomen and pelvis was performed using the standard protocol during bolus administration of intravenous contrast.  CONTRAST:  5m OMNIPAQUE IOHEXOL 300 MG/ML SOLN, 1070mOMNIPAQUE IOHEXOL 350 MG/ML SOLN  COMPARISON:  08/30/2013 CT thorax, 12/23/2012 CT abdomen.  FINDINGS: CTA CHEST FINDINGS  Lucent lesion L2 vertebral body on the left, L3 vertebral body posterior midline. Lucent lesion L4 vertebral body posteriorly on the right. Lucent lesion L5 vertebral body posteriorly, as well as the left lamina. T8 vertebral body small lucent lesion posteriorly on the right. These are all stable from prior CT scan.  Previous reticular nodular infiltrate superior segment right lower lobe not appreciated on the current study. Minimal nodularity medially in the superior segment of the right lower lobe is stable and appears most consistent with scarring. Mild medial apical scarring is stable. No consolidation or pleural effusion. No pericardial effusion.  Mildly prominent thyroid bilaterally, stable. Numerous small nonpathologic mediastinal lymph nodes are stable. Thoracic aorta shows no dissection or dilatation. There are no filling defects in the pulmonary arterial system.  CT ABDOMEN and PELVIS FINDINGS  Status post cholecystectomy. Mild intrahepatic and extrahepatic biliary dilatation likely the result of prior cholecystectomy. Liver is otherwise normal. Spleen is normal. Pancreas is normal.  Adrenal glands are normal.  Kidneys are normal.  Mild atherosclerotic calcification of the abdominal aorta.  No free fluid or significant adenopathy in the abdomen or pelvis. Bladder is normal. Reproductive organs appear to be absent. There is a nonobstructive bowel gas pattern. Appendix is normal. There are air-fluid levels throughout the entire colon.  There is an extensive lytic lesion in the L2 vertebral body. There is vertebra  plana of L4 with mild retropulsion of the posterior cortex causing mild canal narrowing. These findings are unchanged from 12/23/2012.  Review of the MIP images confirms the above findings.  IMPRESSION: 1. Multiple lytic lesions consistent with myeloma tips involvement stable from prior studies. 2. No acute cardiopulmonary abnormality or pulmonary embolism. 3. No acute findings in the abdomen or pelvis.   Electronically Signed   By: RaSkipper Cliche.D.   On: 12/18/2013  12:59     EKG Interpretation None      MDM   Final diagnoses:  Dyspnea  Epigastric pain  Nausea   Pt asymptomatic after medications.  Well oxygenated.  Taking po.  I discussed CT/lab results.  Pt will be DC home.  F/U with Oncologist.    Tanna Furry, MD 12/19/13 [phone]

## 2013-12-18 NOTE — ED Notes (Signed)
Initial Contact - pt resting on stretcher with family at bedside.  Pt continues to c/o 8/10 pain to abd.  Denies nausea at this time.  A+Ox4.  Skin PWD.  MAEI.  Pt drinking for CT.  NAD.

## 2013-12-18 NOTE — ED Notes (Signed)
Pt from home c/o abd pain, SOB, nausea and "feeling tired". Pt is being tx for multiple myeloma. Pt is taking a new PO med that she is supposed to take for 3 weeks in addition to her PO chemo that she tool this am. Pt states that she has been having abd pain for "a long time" but has worsened last pm. Pt states that she feels like "I just need air." Pt is 98% on RA and in NAD. Pt c/o abd pain 9/10. Pt is A&O and in NAD

## 2013-12-18 NOTE — ED Notes (Signed)
Pt ambulated with steady gait to void in BR, CC urine spec obtained and sent to lab.

## 2013-12-18 NOTE — ED Notes (Signed)
Pt to CT

## 2013-12-18 NOTE — Discharge Instructions (Signed)
Abdominal Pain, Women °Abdominal (stomach, pelvic, or belly) pain can be caused by many things. It is important to tell your doctor: °· The location of the pain. °· Does it come and go or is it present all the time? °· Are there things that start the pain (eating certain foods, exercise)? °· Are there other symptoms associated with the pain (fever, nausea, vomiting, diarrhea)? °All of this is helpful to know when trying to find the cause of the pain. °CAUSES  °· Stomach: virus or bacteria infection, or ulcer. °· Intestine: appendicitis (inflamed appendix), regional ileitis (Crohn's disease), ulcerative colitis (inflamed colon), irritable bowel syndrome, diverticulitis (inflamed diverticulum of the colon), or cancer of the stomach or intestine. °· Gallbladder disease or stones in the gallbladder. °· Kidney disease, kidney stones, or infection. °· Pancreas infection or cancer. °· Fibromyalgia (pain disorder). °· Diseases of the female organs: °¨ Uterus: fibroid (non-cancerous) tumors or infection. °¨ Fallopian tubes: infection or tubal pregnancy. °¨ Ovary: cysts or tumors. °¨ Pelvic adhesions (scar tissue). °¨ Endometriosis (uterus lining tissue growing in the pelvis and on the pelvic organs). °¨ Pelvic congestion syndrome (female organs filling up with blood just before the menstrual period). °¨ Pain with the menstrual period. °¨ Pain with ovulation (producing an egg). °¨ Pain with an IUD (intrauterine device, birth control) in the uterus. °¨ Cancer of the female organs. °· Functional pain (pain not caused by a disease, may improve without treatment). °· Psychological pain. °· Depression. °DIAGNOSIS  °Your doctor will decide the seriousness of your pain by doing an examination. °· Blood tests. °· X-rays. °· Ultrasound. °· CT scan (computed tomography, special type of X-ray). °· MRI (magnetic resonance imaging). °· Cultures, for infection. °· Barium enema (dye inserted in the large intestine, to better view it with  X-rays). °· Colonoscopy (looking in intestine with a lighted tube). °· Laparoscopy (minor surgery, looking in abdomen with a lighted tube). °· Major abdominal exploratory surgery (looking in abdomen with a large incision). °TREATMENT  °The treatment will depend on the cause of the pain.  °· Many cases can be observed and treated at home. °· Over-the-counter medicines recommended by your caregiver. °· Prescription medicine. °· Antibiotics, for infection. °· Birth control pills, for painful periods or for ovulation pain. °· Hormone treatment, for endometriosis. °· Nerve blocking injections. °· Physical therapy. °· Antidepressants. °· Counseling with a psychologist or psychiatrist. °· Minor or major surgery. °HOME CARE INSTRUCTIONS  °· Do not take laxatives, unless directed by your caregiver. °· Take over-the-counter pain medicine only if ordered by your caregiver. Do not take aspirin because it can cause an upset stomach or bleeding. °· Try a clear liquid diet (broth or water) as ordered by your caregiver. Slowly move to a bland diet, as tolerated, if the pain is related to the stomach or intestine. °· Have a thermometer and take your temperature several times a day, and record it. °· Bed rest and sleep, if it helps the pain. °· Avoid sexual intercourse, if it causes pain. °· Avoid stressful situations. °· Keep your follow-up appointments and tests, as your caregiver orders. °· If the pain does not go away with medicine or surgery, you may try: °¨ Acupuncture. °¨ Relaxation exercises (yoga, meditation). °¨ Group therapy. °¨ Counseling. °SEEK MEDICAL CARE IF:  °· You notice certain foods cause stomach pain. °· Your home care treatment is not helping your pain. °· You need stronger pain medicine. °· You want your IUD removed. °· You feel faint or   lightheaded.  You develop nausea and vomiting.  You develop a rash.  You are having side effects or an allergy to your medicine. SEEK IMMEDIATE MEDICAL CARE IF:   Your  pain does not go away or gets worse.  You have a fever.  Your pain is felt only in portions of the abdomen. The right side could possibly be appendicitis. The left lower portion of the abdomen could be colitis or diverticulitis.  You are passing blood in your stools (bright red or black tarry stools, with or without vomiting).  You have blood in your urine.  You develop chills, with or without a fever.  You pass out. MAKE SURE YOU:   Understand these instructions.  Will watch your condition.  Will get help right away if you are not doing well or get worse. Document Released: 04/14/2007 Document Revised: 09/09/2011 Document Reviewed: 05/04/2009 [facility] Patient Information 2015 [place], [place]. This information is not intended to replace advice given to you by your health care provider. Make sure you discuss any questions you have with your health care provider.  Nausea, Adult Nausea means you feel sick to your stomach or need to throw up (vomit). It may be a sign of a more serious problem. If nausea gets worse, you may throw up. If you throw up a lot, you may lose too much body fluid (dehydration). HOME CARE   Get plenty of rest.  Ask your doctor how to replace body fluid losses (rehydrate).  Eat small amounts of food. Sip liquids more often.  Take all medicines as told by your doctor. GET HELP RIGHT AWAY IF:  You have a fever.  You pass out (faint).  You keep throwing up or have blood in your throw up.  You are very weak, have dry lips or a dry mouth, or you are very thirsty (dehydrated).  You have dark or bloody poop (stool).  You have very bad chest or belly (abdominal) pain.  You do not get better after 2 days, or you get worse.  You have a headache. MAKE SURE YOU:  Understand these instructions.  Will watch your condition.  Will get help right away if you are not doing well or get worse. Document Released: 06/06/2011 Document Revised: 09/09/2011 Document  Reviewed: 06/06/2011 [facility] Patient Information 2015 [place], [place]. This information is not intended to replace advice given to you by your health care provider. Make sure you discuss any questions you have with your health care provider.  Shortness of Breath Shortness of breath means you have trouble breathing. Shortness of breath needs medical care right away. HOME CARE   Do not smoke.  Avoid being around chemicals or things (paint fumes, dust) that may bother your breathing.  Rest as needed. Slowly begin your normal activities.  Only take medicines as told by your doctor.  Keep all doctor visits as told. GET HELP RIGHT AWAY IF:   Your shortness of breath gets worse.  You feel lightheaded, pass out (faint), or have a cough that is not helped by medicine.  You cough up blood.  You have pain with breathing.  You have pain in your chest, arms, shoulders, or belly (abdomen).  You have a fever.  You cannot walk up stairs or exercise the way you normally do.  You do not get better in the time expected.  You have a hard time doing normal activities even with rest.  You have problems with your medicines.  You have any new symptoms. MAKE  SURE YOU:  Understand these instructions.  Will watch your condition.  Will get help right away if you are not doing well or get worse. Document Released: 12/04/2007 Document Revised: 06/22/2013 Document Reviewed: 09/02/2011 [facility] Patient Information 2015 [place], [place]. This information is not intended to replace advice given to you by your health care provider. Make sure you discuss any questions you have with your health care provider.

## 2013-12-20 ENCOUNTER — Other Ambulatory Visit: Payer: Self-pay | Admitting: *Deleted

## 2013-12-20 DIAGNOSIS — C9000 Multiple myeloma not having achieved remission: Secondary | ICD-10-CM

## 2013-12-20 MED ORDER — MORPHINE SULFATE ER 15 MG PO TBCR: 15.0000 mg | EXTENDED_RELEASE_TABLET | Freq: Two times a day (BID) | ORAL | Status: DC

## 2013-12-21 ENCOUNTER — Other Ambulatory Visit: Payer: Self-pay | Admitting: *Deleted

## 2013-12-21 NOTE — Telephone Encounter (Signed)
THIS REFILL REQUEST FOR REVLIMID WAS GIVEN TO DR.MOHAMED'S NURSE, STEPHANIE JOHNSON,RN.

## 2013-12-22 MED ORDER — POMALIDOMIDE 4 MG PO CAPS: 4.0000 mg | ORAL_CAPSULE | Freq: Every day | ORAL | Status: DC

## 2013-12-22 NOTE — Addendum Note (Signed)
Addended by: Tania Ade on: 12/22/2013 09:46 AM   Modules accepted: Orders

## 2013-12-23 ENCOUNTER — Telehealth: Payer: Self-pay | Admitting: Internal Medicine

## 2013-12-23 ENCOUNTER — Other Ambulatory Visit (HOSPITAL_BASED_OUTPATIENT_CLINIC_OR_DEPARTMENT_OTHER): Payer: Medicare Other

## 2013-12-23 ENCOUNTER — Encounter: Payer: Self-pay | Admitting: Physician Assistant

## 2013-12-23 ENCOUNTER — Encounter: Payer: Self-pay | Admitting: Internal Medicine

## 2013-12-23 ENCOUNTER — Ambulatory Visit (HOSPITAL_BASED_OUTPATIENT_CLINIC_OR_DEPARTMENT_OTHER): Payer: Medicare Other | Admitting: Physician Assistant

## 2013-12-23 VITALS — BP 121/59 | HR 85 | Temp 100.1°F | Resp 18 | Ht 64.75 in | Wt 193.4 lb

## 2013-12-23 DIAGNOSIS — D709 Neutropenia, unspecified: Secondary | ICD-10-CM

## 2013-12-23 DIAGNOSIS — C9002 Multiple myeloma in relapse: Secondary | ICD-10-CM

## 2013-12-23 DIAGNOSIS — R509 Fever, unspecified: Secondary | ICD-10-CM

## 2013-12-23 DIAGNOSIS — G609 Hereditary and idiopathic neuropathy, unspecified: Secondary | ICD-10-CM

## 2013-12-23 DIAGNOSIS — C9000 Multiple myeloma not having achieved remission: Secondary | ICD-10-CM

## 2013-12-23 LAB — CBC WITH DIFFERENTIAL/PLATELET
BASO%: 0.6 % (ref 0.0–2.0)
BASOS ABS: 0 10*3/uL (ref 0.0–0.1)
EOS%: 4.6 % (ref 0.0–7.0)
Eosinophils Absolute: 0.1 10*3/uL (ref 0.0–0.5)
HEMATOCRIT: 32.4 % — AB (ref 34.8–46.6)
HEMOGLOBIN: 10.7 g/dL — AB (ref 11.6–15.9)
LYMPH#: 1 10*3/uL (ref 0.9–3.3)
LYMPH%: 41 % (ref 14.0–49.7)
MCH: 31.5 pg (ref 25.1–34.0)
MCHC: 32.9 g/dL (ref 31.5–36.0)
MCV: 95.8 fL (ref 79.5–101.0)
MONO#: 0.4 10*3/uL (ref 0.1–0.9)
MONO%: 16.5 % — ABNORMAL HIGH (ref 0.0–14.0)
NEUT#: 0.9 10*3/uL — ABNORMAL LOW (ref 1.5–6.5)
NEUT%: 37.3 % — ABNORMAL LOW (ref 38.4–76.8)
PLATELETS: 203 10*3/uL (ref 145–400)
RBC: 3.38 10*6/uL — ABNORMAL LOW (ref 3.70–5.45)
RDW: 15.9 % — ABNORMAL HIGH (ref 11.2–14.5)
WBC: 2.4 10*3/uL — ABNORMAL LOW (ref 3.9–10.3)

## 2013-12-23 LAB — COMPREHENSIVE METABOLIC PANEL (CC13)
ALT: 18 U/L (ref 0–55)
ANION GAP: 10 meq/L (ref 3–11)
AST: 15 U/L (ref 5–34)
Albumin: 2.9 g/dL — ABNORMAL LOW (ref 3.5–5.0)
Alkaline Phosphatase: 81 U/L (ref 40–150)
BUN: 10 mg/dL (ref 7.0–26.0)
CALCIUM: 8.7 mg/dL (ref 8.4–10.4)
CHLORIDE: 102 meq/L (ref 98–109)
CO2: 24 meq/L (ref 22–29)
CREATININE: 1 mg/dL (ref 0.6–1.1)
Glucose: 115 mg/dl (ref 70–140)
Potassium: 3.5 mEq/L (ref 3.5–5.1)
Sodium: 135 mEq/L — ABNORMAL LOW (ref 136–145)
Total Bilirubin: 0.49 mg/dL (ref 0.20–1.20)
Total Protein: 7 g/dL (ref 6.4–8.3)

## 2013-12-23 LAB — TECHNOLOGIST REVIEW

## 2013-12-23 MED ORDER — LEVOFLOXACIN 500 MG PO TABS: 500.0000 mg | ORAL_TABLET | Freq: Every day | ORAL | Status: DC

## 2013-12-23 NOTE — Progress Notes (Signed)
Put illness form on nurse's desk

## 2013-12-23 NOTE — Progress Notes (Addendum)
[place] [phone]   Fax:(336) [phone]  SHARED VISIT PROGRESS NOTE  [name], [address] [place] [address]  DIAGNOSIS: Recurrent multiple myeloma initially diagnosed in October 2008.   PRIOR THERAPY:  1. Status post palliative radiotherapy to the lumbar spine between L3 and L5. The patient received a total dose of 3000 cGy in 10 fractions under the care of Dr. Lisbeth Renshaw between May 05, 2007 through May 18, 2007. 2. Status post 5 cycles of systemic chemotherapy with Revlimid and low-dose Decadron with good response to this treatment. 3. Status post autologous peripheral blood stem cell transplant at [facility] on Nov 20, 2007 under the care of Dr. Valarie Merino. 4. Status post treatment for disease recurrence with Velcade, Doxil and Decadron. Last dose given Nov 09, 2009. Discontinued secondary to intolerance but the patient had a good response to treatment at that time. 5. Status post palliative radiotherapy to the T2-T6 thoracic vertebrae completed 03/21/2011 under the care of Dr. Lisbeth Renshaw. 6. Systemic chemotherapy with Velcade 1.3 mg per meter squared given on days 1, 4, 8 and 11, and Doxil at 30 mg per meter squared given on day 4 in addition to Decadron status post 4 cycles, discontinued secondary to intolerance. 7. Systemic therapy with Velcade 1.3 mg/M2 subcutaneously in addition to Decadron 40 mg by mouth on a weekly basis, status post 20 cycles. The patient had good response with this treatment but it is discontinue today secondary to worsening peripheral neuropathy. 8. Palliative radiotherapy to the skull lesion as well as the left hip area under the care of Dr. Lisbeth Renshaw. 9. Systemic chemotherapy with Carfilzomib 20 mg/M2 on days 1, 2,  8, 9, 13 and 16 every 4 weeks in addition to weekly Decadron 40 mg by mouth. First dose on 04/19/2013. Status post 4 cycles, discontinued recently secondary to cardiac  dysfunction.  CURRENT THERAPY:  1. Pomalyst 4 mg by mouth daily for 21 days every 4 weeks in addition to dexamethasone 40 mg on a weekly basis. Therapy beginning 12/02/2013  2. Zometa 4 mg IV given every 3 months   INTERVAL HISTORY: [name] 63 y.o. female returns to the clinic today for follow up visit accompanied by her daughter. Overall she has tolerated her first cycle of Pomalyst without difficulty. She completed cycle #1 on 12/22/2013. She complains of low-grade fever. Temperature here today is 100.1. She reports that she went to the emergency room on Saturday with complaints of shortness of breath and abdominal pain. She continues to have some peripheral neuropathy symptoms but the these symptoms are stable to improved on gabapentin. She denied having any significant fatigue or weakness. The patient denied having any significant chest pain, shortness of breath, cough or hemoptysis. She has no nausea or vomiting. She has no weight loss or night sweats.   MEDICAL HISTORY: Past Medical History  Diagnosis Date  . Thyroid disease Hypothyroidism  . Hypercholesterolemia   . Compression fracture 04/08/2007    pathologic compression fracture  . Hypothyroidism   . FHx: chemotherapy     s/p 5 cycle revlimid/low dose decadron,s/p velcade,doxil,decadron,  . Hx of radiation therapy 05/05/07-05/18/07,& 03/05/11-03/21/11-    l3&l5 in 2008, t2-t6 03/2011  . GERD (gastroesophageal reflux disease)   . Insomnia     associated with steroids  . Nausea   . Constipation     takes oxycontin,vicodin  . Hx of radiation therapy 05/05/2007 to 05/18/2007    palliative, L3-5  .  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 Hx of radiation therapy 03/05/2011 to 03/21/2011    palliative T2-T6, c-spine  . 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 PONV (postoperative nausea and vomiting)   . Metastasis to bone     ALLERGIES:  has No Known Allergies.  MEDICATIONS:  Current  Outpatient Prescriptions  Medication Sig Dispense Refill  . ALPRAZolam (XANAX) 0.5 MG tablet Take 0.5 mg by mouth 3 (three) times daily as needed for anxiety.      Marland Kitchen atorvastatin (LIPITOR) 40 MG tablet Take 40 mg by mouth every evening.      . celecoxib (CELEBREX) 200 MG capsule Take 200 mg by mouth daily as needed for mild pain.      Marland Kitchen dexamethasone (DECADRON) 4 MG tablet Take 40 mg by mouth every Monday.      Marland Kitchen dexlansoprazole (DEXILANT) 60 MG capsule Take 60 mg by mouth every morning.      . Eszopiclone 3 MG TABS Take 3 mg by mouth at bedtime as needed (sleep). Take immediately before bedtime      . furosemide (LASIX) 40 MG tablet Take 40 mg by mouth 2 (two) times daily.      Marland Kitchen gabapentin (NEURONTIN) 600 MG tablet Take 600 mg by mouth 4 (four) times daily as needed (pain).      Marland Kitchen HYDROcodone-acetaminophen (NORCO/VICODIN) 5-325 MG per tablet Take 1 tablet by mouth every 4 (four) hours as needed.  20 tablet  0  . levothyroxine (SYNTHROID, LEVOTHROID) 100 MCG tablet Take 50-100 mcg by mouth daily before breakfast. Take 0.5 tab (85mg) on Monday, Wednesday, and Friday. Take 1 tab (1079m) on Sunday, Tuesday, Thursday, and Saturday.      . loperamide (IMODIUM) 2 MG capsule Take 2-4 mg by mouth as needed for diarrhea or loose stools.       . magnesium gluconate (MAGONATE) 500 MG tablet Take 500 mg by mouth every morning.      . Marland Kitchenorphine (MS CONTIN) 15 MG 12 hr tablet Take 1 tablet (15 mg total) by mouth every 12 (twelve) hours.  60 tablet  0  . Multiple Vitamin (MULITIVITAMIN WITH MINERALS) TABS Take 1 tablet by mouth every morning.       . ondansetron (ZOFRAN) 4 MG tablet Take 1 tablet (4 mg total) by mouth every 6 (six) hours.  12 tablet  0  . ondansetron (ZOFRAN-ODT) 4 MG disintegrating tablet Take 4 mg by mouth every 8 (eight) hours as needed for nausea or vomiting.      . pomalidomide (POMALYST) 4 MG capsule Take 1 capsule (4 mg total) by mouth daily. Take with water on days 1-21. Repeat every 28  days.  21 capsule  0  . potassium chloride (K-DUR) 10 MEQ tablet Take 10 mEq by mouth 2 (two) times daily.      . Marland KitchenYDROcodone-acetaminophen (NORCO) 7.5-325 MG per tablet Take 1 tablet by mouth every 6 (six) hours as needed for moderate pain.      . Marland Kitchenevofloxacin (LEVAQUIN) 500 MG tablet Take 1 tablet (500 mg total) by mouth daily.  7 tablet  0   No current facility-administered medications for this visit.    SURGICAL HISTORY:  Past Surgical History  Procedure Laterality Date  . Cholecystectomy    . Knee surgery    . Knee surgery    . Video bronchoscopy  07/30/2011    Procedure: VIDEO BRONCHOSCOPY WITHOUT FLUORO;  Surgeon: KeArmando Reichert  Gwenette Greet, MD;  Location: [facility];  Service: Cardiopulmonary;  Laterality: Bilateral;    REVIEW OF SYSTEMS:  Constitutional: positive for fevers Eyes: negative Ears, nose, mouth, throat, and face: negative Respiratory: negative Cardiovascular: negative Gastrointestinal: negative Genitourinary:negative Integument/breast: negative Hematologic/lymphatic: negative Musculoskeletal:positive for back pain Neurological: positive for seizures Behavioral/Psych: negative Endocrine: negative Allergic/Immunologic: negative   PHYSICAL EXAMINATION: General appearance: alert, cooperative, fatigued and no distress Head: Normocephalic, without obvious abnormality, atraumatic Neck: no adenopathy, no JVD, supple, symmetrical, trachea midline and thyroid not enlarged, symmetric, no tenderness/mass/nodules Lymph nodes: Cervical, supraclavicular, and axillary nodes normal. Resp: clear to auscultation bilaterally Back: symmetric, no curvature. ROM normal. No CVA tenderness. Cardio: regular rate and rhythm, S1, S2 normal, no murmur, click, rub or gallop GI: soft, non-tender; bowel sounds normal; no masses,  no organomegaly Extremities: extremities normal, atraumatic, no cyanosis or edema Neurologic: Alert and oriented X 3, normal strength and tone. Normal symmetric  reflexes. Normal coordination and gait  ECOG PERFORMANCE STATUS: 1 - Symptomatic but completely ambulatory  Blood pressure 121/59, pulse 85, temperature 100.1 F (37.8 C), temperature source Oral, resp. rate 18, height 5' 4.75" (1.645 m), weight 193 lb 6.4 oz (87.726 kg), SpO2 98.00%.  LABORATORY DATA: Lab Results  Component Value Date   WBC 2.4* 12/23/2013   HGB 10.7* 12/23/2013   HCT 32.4* 12/23/2013   MCV 95.8 12/23/2013   PLT 203 12/23/2013      Chemistry      Component Value Date/Time   NA 135* 12/23/2013 1125   NA 137 12/18/2013 1111   K 3.5 12/23/2013 1125   K 3.9 12/18/2013 1111   CL 103 12/18/2013 1111   CL 105 12/17/2012 1015   CO2 24 12/23/2013 1125   CO2 21 12/18/2013 1111   BUN 10.0 12/23/2013 1125   BUN 9 12/18/2013 1111   CREATININE 1.0 12/23/2013 1125   CREATININE 0.79 12/18/2013 1111   CREATININE 0.78 11/04/2013 1100      Component Value Date/Time   CALCIUM 8.7 12/23/2013 1125   CALCIUM 8.2* 12/18/2013 1111   ALKPHOS 81 12/23/2013 1125   ALKPHOS 89 12/18/2013 1111   AST 15 12/23/2013 1125   AST 9 12/18/2013 1111   ALT 18 12/23/2013 1125   ALT 10 12/18/2013 1111   BILITOT 0.49 12/23/2013 1125   BILITOT 0.6 12/18/2013 1111     Other lab results: Beta-2 microglobulin 2.69, free kappa light chain 2.80, free lambda light chain 19.40, kappa/lambda ratio 0.14, IgG 1400, IgA 110 and IgM 23.  RADIOGRAPHIC STUDIES:  ASSESSMENT AND PLAN:  This is a very pleasant 63 years old [place] American female with recurrent multiple myeloma recently completed a course of treatment with Velcade and Decadron with improvement in her disease but this was discontinued secondary to peripheral neuropathy. She was then treated with Carfilzomib and Decadron. The patient tolerated her treatment with Carfilzomib and Decadron fairly well except for  shortness breath and cough which was felt to be secondary to congestive heart failure from her treatment with Carfilzomib. She had been on observation for 3 months  off treatment, however had evidence of disease progression. She is now being treated with Pomalyst 4 mg by mouth daily for 21 days every 4 weeks in addition to dexamethasone 40 mg on a weekly basis. Therapy beginning 12/02/2013 She's completed her first cycle on 12/22/2013 and overall has tolerated it without difficulty. Her ANC is a bit low today at 0.9. Patient was discussed with him also seen by Dr. Julien Nordmann. We will have  her check labs again next week with repeat CBC differential C. met and pending the results of those laboratory studies will determine  when she is to start cycle #2 of her treatment with Pomalyst 4 mg by mouth daily for 21 days every 4 weeks in addition to dexamethasone 40 mg on a weekly basis. She will followup in 2 weeks for another symptom management visit. For low-grade fever, we have we'll empirically cover her with a 7 day course of Levaquin 500 mg by mouth daily. A prescription for the Levaquin was sent her pharmacy of record via E. scribed.  The patient will continue her current treatment with Zometa every 3 months as scheduled. She was advised to call immediately if she has any concerning symptoms in the interval. The patient voices understanding of current disease status and treatment options and is in agreement with the current care plan.  All questions were answered. The patient knows to call the clinic with any problems, questions or concerns. We can certainly see the patient much sooner if necessary.  Carlton Adam [name]  ADDENDUM: Hematology/Oncology Attending:  I had a face to face encounter with the patient. I recommended her current plan. This is a very pleasant 63 years old [place] American female with history of relapsed multiple myeloma currently undergoing treatment with Pomalyst and Decadron status post 1 cycle. She tolerated the first cycle of her treatment fairly well except for neutropenia on the lab work performed earlier today. She also has low-grade  fever. We will cover her empirically with Levaquin for 7 days. The patient is currently on the week off her treatment. I will repeat CBC and comprehensive metabolic panel in one week before starting cycle #2. She was advised not to start treatment with Pomalyst until her CBC improved. She would come back for followup visit in 2 weeks for reevaluation. She was advised to call immediately if she has any concerning symptoms in the interval.  Disclaimer: This note was dictated with voice recognition software. Similar sounding words can inadvertently be transcribed and may not be corrected upon review. Eilleen Kempf., MD 12/26/2013

## 2013-12-23 NOTE — Telephone Encounter (Signed)
gv and printed appt sched and avs fo rpt for July °

## 2013-12-23 NOTE — Patient Instructions (Signed)
Return in one week to have your labs rechecked.  Do not start your next cycle of Pomalyst until instructed Followup in 2 weeks

## 2013-12-24 ENCOUNTER — Encounter: Payer: Self-pay | Admitting: Internal Medicine

## 2013-12-24 NOTE — Progress Notes (Signed)
Put [employer] form in registration desk.

## 2013-12-29 ENCOUNTER — Other Ambulatory Visit: Payer: Medicare Other

## 2013-12-29 ENCOUNTER — Ambulatory Visit: Payer: Medicare Other | Admitting: Internal Medicine

## 2013-12-30 ENCOUNTER — Other Ambulatory Visit (HOSPITAL_BASED_OUTPATIENT_CLINIC_OR_DEPARTMENT_OTHER): Payer: Medicare Other

## 2013-12-30 ENCOUNTER — Other Ambulatory Visit: Payer: Self-pay | Admitting: Physician Assistant

## 2013-12-30 ENCOUNTER — Telehealth: Payer: Self-pay | Admitting: *Deleted

## 2013-12-30 DIAGNOSIS — C9000 Multiple myeloma not having achieved remission: Secondary | ICD-10-CM

## 2013-12-30 DIAGNOSIS — C9002 Multiple myeloma in relapse: Secondary | ICD-10-CM

## 2013-12-30 LAB — COMPREHENSIVE METABOLIC PANEL (CC13)
ALK PHOS: 97 U/L (ref 40–150)
ALT: 18 U/L (ref 0–55)
AST: 12 U/L (ref 5–34)
Albumin: 3.1 g/dL — ABNORMAL LOW (ref 3.5–5.0)
Anion Gap: 9 mEq/L (ref 3–11)
BUN: 17.4 mg/dL (ref 7.0–26.0)
CO2: 25 meq/L (ref 22–29)
Calcium: 9.4 mg/dL (ref 8.4–10.4)
Chloride: 108 mEq/L (ref 98–109)
Creatinine: 1.1 mg/dL (ref 0.6–1.1)
GLUCOSE: 117 mg/dL (ref 70–140)
Potassium: 4 mEq/L (ref 3.5–5.1)
SODIUM: 141 meq/L (ref 136–145)
TOTAL PROTEIN: 6.8 g/dL (ref 6.4–8.3)
Total Bilirubin: 0.5 mg/dL (ref 0.20–1.20)

## 2013-12-30 LAB — CBC WITH DIFFERENTIAL/PLATELET
BASO%: 1.9 % (ref 0.0–2.0)
Basophils Absolute: 0.1 10*3/uL (ref 0.0–0.1)
EOS ABS: 0.1 10*3/uL (ref 0.0–0.5)
EOS%: 2.3 % (ref 0.0–7.0)
HCT: 32.1 % — ABNORMAL LOW (ref 34.8–46.6)
HGB: 10.4 g/dL — ABNORMAL LOW (ref 11.6–15.9)
LYMPH%: 44.4 % (ref 14.0–49.7)
MCH: 31.4 pg (ref 25.1–34.0)
MCHC: 32.6 g/dL (ref 31.5–36.0)
MCV: 96.6 fL (ref 79.5–101.0)
MONO#: 0.4 10*3/uL (ref 0.1–0.9)
MONO%: 12.8 % (ref 0.0–14.0)
NEUT%: 38.6 % (ref 38.4–76.8)
NEUTROS ABS: 1.3 10*3/uL — AB (ref 1.5–6.5)
Platelets: 318 10*3/uL (ref 145–400)
RBC: 3.32 10*6/uL — AB (ref 3.70–5.45)
RDW: 15.8 % — AB (ref 11.2–14.5)
WBC: 3.4 10*3/uL — AB (ref 3.9–10.3)
lymph#: 1.5 10*3/uL (ref 0.9–3.3)

## 2013-12-30 NOTE — Telephone Encounter (Signed)
Pt in lobby wanting to know the results of her CBC to see whether she can start treatment.  Per dr Vista Mink, CBC results reviewed for 7/2, okay for pt to start treatment with pomalyst.  Pt verbalized understanding.   SLJ

## 2014-01-06 ENCOUNTER — Ambulatory Visit (HOSPITAL_BASED_OUTPATIENT_CLINIC_OR_DEPARTMENT_OTHER): Payer: Medicare Other | Admitting: Internal Medicine

## 2014-01-06 ENCOUNTER — Telehealth: Payer: Self-pay | Admitting: Internal Medicine

## 2014-01-06 ENCOUNTER — Other Ambulatory Visit (HOSPITAL_BASED_OUTPATIENT_CLINIC_OR_DEPARTMENT_OTHER): Payer: Medicare Other

## 2014-01-06 ENCOUNTER — Encounter: Payer: Self-pay | Admitting: Internal Medicine

## 2014-01-06 VITALS — BP 120/68 | HR 78 | Temp 98.7°F | Resp 18 | Ht 64.0 in | Wt 192.2 lb

## 2014-01-06 DIAGNOSIS — C9000 Multiple myeloma not having achieved remission: Secondary | ICD-10-CM

## 2014-01-06 DIAGNOSIS — C9002 Multiple myeloma in relapse: Secondary | ICD-10-CM

## 2014-01-06 DIAGNOSIS — R05 Cough: Secondary | ICD-10-CM

## 2014-01-06 DIAGNOSIS — R059 Cough, unspecified: Secondary | ICD-10-CM

## 2014-01-06 DIAGNOSIS — R0602 Shortness of breath: Secondary | ICD-10-CM

## 2014-01-06 LAB — COMPREHENSIVE METABOLIC PANEL (CC13)
ALT: 13 U/L (ref 0–55)
AST: 10 U/L (ref 5–34)
Albumin: 2.9 g/dL — ABNORMAL LOW (ref 3.5–5.0)
Alkaline Phosphatase: 96 U/L (ref 40–150)
Anion Gap: 7 mEq/L (ref 3–11)
BUN: 5.7 mg/dL — AB (ref 7.0–26.0)
CO2: 26 meq/L (ref 22–29)
Calcium: 8.1 mg/dL — ABNORMAL LOW (ref 8.4–10.4)
Chloride: 108 mEq/L (ref 98–109)
Creatinine: 0.8 mg/dL (ref 0.6–1.1)
GLUCOSE: 94 mg/dL (ref 70–140)
POTASSIUM: 3.3 meq/L — AB (ref 3.5–5.1)
SODIUM: 140 meq/L (ref 136–145)
TOTAL PROTEIN: 6.6 g/dL (ref 6.4–8.3)
Total Bilirubin: 0.83 mg/dL (ref 0.20–1.20)

## 2014-01-06 LAB — CBC WITH DIFFERENTIAL/PLATELET
BASO%: 0.8 % (ref 0.0–2.0)
BASOS ABS: 0 10*3/uL (ref 0.0–0.1)
EOS ABS: 0.3 10*3/uL (ref 0.0–0.5)
EOS%: 10 % — ABNORMAL HIGH (ref 0.0–7.0)
HCT: 31.1 % — ABNORMAL LOW (ref 34.8–46.6)
HEMOGLOBIN: 10.1 g/dL — AB (ref 11.6–15.9)
LYMPH%: 22.2 % (ref 14.0–49.7)
MCH: 31.3 pg (ref 25.1–34.0)
MCHC: 32.5 g/dL (ref 31.5–36.0)
MCV: 96.2 fL (ref 79.5–101.0)
MONO#: 0.1 10*3/uL (ref 0.1–0.9)
MONO%: 4 % (ref 0.0–14.0)
NEUT%: 63 % (ref 38.4–76.8)
NEUTROS ABS: 2 10*3/uL (ref 1.5–6.5)
Platelets: 308 10*3/uL (ref 145–400)
RBC: 3.24 10*6/uL — AB (ref 3.70–5.45)
RDW: 15.9 % — AB (ref 11.2–14.5)
WBC: 3.2 10*3/uL — AB (ref 3.9–10.3)
lymph#: 0.7 10*3/uL — ABNORMAL LOW (ref 0.9–3.3)

## 2014-01-06 MED ORDER — HYDROCODONE-ACETAMINOPHEN 7.5-325 MG PO TABS: 1.0000 | ORAL_TABLET | Freq: Four times a day (QID) | ORAL | Status: DC | PRN

## 2014-01-06 NOTE — Progress Notes (Signed)
[place] Telephone:(336) [phone]   Fax:(336) [place], [place] [address]  DIAGNOSIS: Recurrent multiple myeloma initially diagnosed in October 2008.   PRIOR THERAPY:  1. Status post palliative radiotherapy to the lumbar spine between L3 and L5. The patient received a total dose of 3000 cGy in 10 fractions under the care of Dr. Lisbeth Renshaw between May 05, 2007 through May 18, 2007. 2. Status post 5 cycles of systemic chemotherapy with Revlimid and low-dose Decadron with good response to this treatment. 3. Status post autologous peripheral blood stem cell transplant at [facility] on Nov 20, 2007 under the care of Dr. Valarie Merino. 4. Status post treatment for disease recurrence with Velcade, Doxil and Decadron. Last dose given Nov 09, 2009. Discontinued secondary to intolerance but the patient had a good response to treatment at that time. 5. Status post palliative radiotherapy to the T2-T6 thoracic vertebrae completed 03/21/2011 under the care of Dr. Lisbeth Renshaw. 6. Systemic chemotherapy with Velcade 1.3 mg per meter squared given on days 1, 4, 8 and 11, and Doxil at 30 mg per meter squared given on day 4 in addition to Decadron status post 4 cycles, discontinued secondary to intolerance. 7. Systemic therapy with Velcade 1.3 mg/M2 subcutaneously in addition to Decadron 40 mg by mouth on a weekly basis, status post 20 cycles. The patient had good response with this treatment but it is discontinue today secondary to worsening peripheral neuropathy. 8. Palliative radiotherapy to the skull lesion as well as the left hip area under the care of Dr. Lisbeth Renshaw. 9. Systemic chemotherapy with Carfilzomib 20 mg/M2 on days 1, 2,  8, 9, 13 and 16 every 4 weeks in addition to weekly Decadron 40 mg by mouth. First dose on 04/19/2013. Status post 4 cycles, discontinued recently secondary to cardiac  dysfunction.  CURRENT THERAPY:  1. Pomalyst 4 mg by mouth daily for 21 days every 4 weeks in addition to dexamethasone 40 mg on a weekly basis. First dose started 12/02/2013. 2. Zometa 4 mg IV given every 3 months   INTERVAL HISTORY: [name] 63 y.o. female returns to the clinic today for follow up visit accompanied by her daughter. The patient is feeling fine today with no specific complaints except mild cough productive of whitish sputum. She was started recently on treatment with Pomalyst and Decadron status post 1 cycle. She started his second cycle of her treatment on 12/31/2013. She denied having any significant fatigue or weakness. The patient denied having any significant chest pain, shortness of breath, or hemoptysis. She has no nausea or vomiting. She has no weight loss or night sweats. She continues to have mild peripheral neuropathy.   MEDICAL HISTORY: Past Medical History  Diagnosis Date  . Thyroid disease Hypothyroidism  . Hypercholesterolemia   . Compression fracture 04/08/2007    pathologic compression fracture  . Hypothyroidism   . FHx: chemotherapy     s/p 5 cycle revlimid/low dose decadron,s/p velcade,doxil,decadron,  . Hx of radiation therapy 05/05/07-05/18/07,& 03/05/11-03/21/11-    l3&l5 in 2008, t2-t6 03/2011  . GERD (gastroesophageal reflux disease)   . Insomnia     associated with steroids  . Nausea   . Constipation     takes oxycontin,vicodin  . Hx of radiation therapy 05/05/2007 to 05/18/2007    palliative, L3-5  . 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 Hx of radiation therapy 03/05/2011 to 03/21/2011  palliative T2-T6, c-spine  . 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 PONV (postoperative nausea and vomiting)   . Metastasis to bone     ALLERGIES:  has No Known Allergies.  MEDICATIONS:  Current Outpatient Prescriptions  Medication Sig Dispense Refill  . ALPRAZolam (XANAX) 0.5 MG tablet Take 0.5 mg by mouth 3  (three) times daily as needed for anxiety.      Marland Kitchen atorvastatin (LIPITOR) 40 MG tablet Take 40 mg by mouth every evening.      . celecoxib (CELEBREX) 200 MG capsule Take 200 mg by mouth daily as needed for mild pain.      Marland Kitchen dexamethasone (DECADRON) 4 MG tablet Take 40 mg by mouth every Monday.      Marland Kitchen dexlansoprazole (DEXILANT) 60 MG capsule Take 60 mg by mouth every morning.      . Eszopiclone 3 MG TABS Take 3 mg by mouth at bedtime as needed (sleep). Take immediately before bedtime      . furosemide (LASIX) 40 MG tablet Take 40 mg by mouth 2 (two) times daily.      Marland Kitchen gabapentin (NEURONTIN) 600 MG tablet Take 600 mg by mouth 4 (four) times daily as needed (pain).      Marland Kitchen HYDROcodone-acetaminophen (NORCO) 7.5-325 MG per tablet Take 1 tablet by mouth every 6 (six) hours as needed for moderate pain.  30 tablet  0  . levothyroxine (SYNTHROID, LEVOTHROID) 100 MCG tablet Take 50-100 mcg by mouth daily before breakfast. Take 0.5 tab (26mg) on Monday, Wednesday, and Friday. Take 1 tab (1081m) on Sunday, Tuesday, Thursday, and Saturday.      . loperamide (IMODIUM) 2 MG capsule Take 2-4 mg by mouth as needed for diarrhea or loose stools.       . magnesium gluconate (MAGONATE) 500 MG tablet Take 500 mg by mouth every morning.      . Marland Kitchenorphine (MS CONTIN) 15 MG 12 hr tablet Take 1 tablet (15 mg total) by mouth every 12 (twelve) hours.  60 tablet  0  . Multiple Vitamin (MULITIVITAMIN WITH MINERALS) TABS Take 1 tablet by mouth every morning.       . ondansetron (ZOFRAN) 4 MG tablet Take 1 tablet (4 mg total) by mouth every 6 (six) hours.  12 tablet  0  . ondansetron (ZOFRAN-ODT) 4 MG disintegrating tablet Take 4 mg by mouth every 8 (eight) hours as needed for nausea or vomiting.      . pomalidomide (POMALYST) 4 MG capsule Take 1 capsule (4 mg total) by mouth daily. Take with water on days 1-21. Repeat every 28 days.  21 capsule  0  . potassium chloride (K-DUR) 10 MEQ tablet Take 10 mEq by mouth 2 (two) times  daily.      . promethazine-dextromethorphan (PROMETHAZINE-DM) 6.25-15 MG/5ML syrup TAKE 5 MLS BY MOUTH 4 (FOUR) TIMES DAILY AS NEEDED FOR COUGH.  180 mL  0   No current facility-administered medications for this visit.    SURGICAL HISTORY:  Past Surgical History  Procedure Laterality Date  . Cholecystectomy    . Knee surgery    . Knee surgery    . Video bronchoscopy  07/30/2011    Procedure: VIDEO BRONCHOSCOPY WITHOUT FLUORO;  Surgeon: KeKathee DeltonMD;  [facility];  Service: Cardiopulmonary;  Laterality: Bilateral;    REVIEW OF SYSTEMS:  A comprehensive review of systems was negative except for: Respiratory: positive for cough and sputum   PHYSICAL EXAMINATION:  General appearance: alert, cooperative, fatigued and no distress Head: Normocephalic, without obvious abnormality, atraumatic Neck: no adenopathy, no JVD, supple, symmetrical, trachea midline and thyroid not enlarged, symmetric, no tenderness/mass/nodules Lymph nodes: Cervical, supraclavicular, and axillary nodes normal. Resp: clear to auscultation bilaterally Back: symmetric, no curvature. ROM normal. No CVA tenderness. Cardio: regular rate and rhythm, S1, S2 normal, no murmur, click, rub or gallop GI: soft, non-tender; bowel sounds normal; no masses,  no organomegaly Extremities: extremities normal, atraumatic, no cyanosis or edema Neurologic: Alert and oriented X 3, normal strength and tone. Normal symmetric reflexes. Normal coordination and gait  ECOG PERFORMANCE STATUS: 1 - Symptomatic but completely ambulatory  Blood pressure 120/68, pulse 78, temperature 98.7 F (37.1 C), temperature source Oral, resp. rate 18, height 5' 4"  (1.626 m), weight 192 lb 3.2 oz (87.181 kg), SpO2 100.00%.  LABORATORY DATA: Lab Results  Component Value Date   WBC 3.2* 01/06/2014   HGB 10.1* 01/06/2014   HCT 31.1* 01/06/2014   MCV 96.2 01/06/2014   PLT 308 01/06/2014      Chemistry      Component Value Date/Time   NA 141  12/30/2013 1015   NA 137 12/18/2013 1111   K 4.0 12/30/2013 1015   K 3.9 12/18/2013 1111   CL 103 12/18/2013 1111   CL 105 12/17/2012 1015   CO2 25 12/30/2013 1015   CO2 21 12/18/2013 1111   BUN 17.4 12/30/2013 1015   BUN 9 12/18/2013 1111   CREATININE 1.1 12/30/2013 1015   CREATININE 0.79 12/18/2013 1111   CREATININE 0.78 11/04/2013 1100      Component Value Date/Time   CALCIUM 9.4 12/30/2013 1015   CALCIUM 8.2* 12/18/2013 1111   ALKPHOS 97 12/30/2013 1015   ALKPHOS 89 12/18/2013 1111   AST 12 12/30/2013 1015   AST 9 12/18/2013 1111   ALT 18 12/30/2013 1015   ALT 10 12/18/2013 1111   BILITOT 0.50 12/30/2013 1015   BILITOT 0.6 12/18/2013 1111     RADIOGRAPHIC STUDIES:  ASSESSMENT AND PLAN:  This is a very pleasant 63 years old [place] American female with recurrent multiple myeloma recently completed a course of treatment with Velcade and Decadron with improvement in her disease but this was discontinued secondary to peripheral neuropathy. The patient is tolerating her treatment with Carfilzomib and Decadron fairly well except for the recent shortness breath and cough which was felt to be secondary to congestive heart failure from her treatment with Carfilzomib. She was started on treatment with Pomalyst and Decadron status post 1 cycle. She tolerated the first cycle of her treatment well except for neutropenia. I recommended for her to continue cycle #2 of her treatment as scheduled. She would come back for followup visit in 3 weeks for evaluation and management any adverse effect of her treatment. The patient will continue her current treatment with Zometa every 3 months as scheduled. She was given a refill of her pain medication. She was advised to call immediately if she has any concerning symptoms in the interval. The patient voices understanding of current disease status and treatment options and is in agreement with the current care plan.  All questions were answered. The patient knows to call the clinic  with any problems, questions or concerns. We can certainly see the patient much sooner if necessary.  Disclaimer: This note was dictated with voice recognition software. Similar sounding words can inadvertently be transcribed and may not be corrected upon review.

## 2014-01-06 NOTE — Telephone Encounter (Signed)
gv and printed appt scehd adn avs for pt fro July

## 2014-01-12 ENCOUNTER — Other Ambulatory Visit: Payer: Self-pay | Admitting: Emergency Medicine

## 2014-01-16 ENCOUNTER — Other Ambulatory Visit: Payer: Self-pay | Admitting: Physician Assistant

## 2014-01-20 ENCOUNTER — Other Ambulatory Visit: Payer: Self-pay | Admitting: *Deleted

## 2014-01-20 DIAGNOSIS — C9000 Multiple myeloma not having achieved remission: Secondary | ICD-10-CM

## 2014-01-20 MED ORDER — MORPHINE SULFATE ER 15 MG PO TBCR: 15.0000 mg | EXTENDED_RELEASE_TABLET | Freq: Two times a day (BID) | ORAL | Status: DC

## 2014-01-26 ENCOUNTER — Emergency Department (HOSPITAL_COMMUNITY): Payer: Medicare Other

## 2014-01-26 ENCOUNTER — Inpatient Hospital Stay (HOSPITAL_COMMUNITY)
Admission: EM | Admit: 2014-01-26 | Discharge: 2014-01-29 | DRG: 092 | Disposition: A | Discharge status: Home / Self Care | Payer: Medicare Other | Attending: Internal Medicine | Admitting: Internal Medicine

## 2014-01-26 ENCOUNTER — Encounter (HOSPITAL_COMMUNITY): Payer: Self-pay | Admitting: Emergency Medicine

## 2014-01-26 DIAGNOSIS — K219 Gastro-esophageal reflux disease without esophagitis: Secondary | ICD-10-CM | POA: Diagnosis present

## 2014-01-26 DIAGNOSIS — G4700 Insomnia, unspecified: Secondary | ICD-10-CM | POA: Diagnosis present

## 2014-01-26 DIAGNOSIS — Z9484 Stem cells transplant status: Secondary | ICD-10-CM

## 2014-01-26 DIAGNOSIS — Z800 Family history of malignant neoplasm of digestive organs: Secondary | ICD-10-CM | POA: Diagnosis not present

## 2014-01-26 DIAGNOSIS — K5900 Constipation, unspecified: Secondary | ICD-10-CM | POA: Diagnosis present

## 2014-01-26 DIAGNOSIS — R29898 Other symptoms and signs involving the musculoskeletal system: Secondary | ICD-10-CM | POA: Diagnosis present

## 2014-01-26 DIAGNOSIS — C9000 Multiple myeloma not having achieved remission: Secondary | ICD-10-CM | POA: Diagnosis present

## 2014-01-26 DIAGNOSIS — Z7982 Long term (current) use of aspirin: Secondary | ICD-10-CM | POA: Diagnosis not present

## 2014-01-26 DIAGNOSIS — G609 Hereditary and idiopathic neuropathy, unspecified: Secondary | ICD-10-CM | POA: Diagnosis present

## 2014-01-26 DIAGNOSIS — Z803 Family history of malignant neoplasm of breast: Secondary | ICD-10-CM | POA: Diagnosis not present

## 2014-01-26 DIAGNOSIS — Z923 Personal history of irradiation: Secondary | ICD-10-CM | POA: Diagnosis not present

## 2014-01-26 DIAGNOSIS — Z79899 Other long term (current) drug therapy: Secondary | ICD-10-CM | POA: Diagnosis not present

## 2014-01-26 DIAGNOSIS — E039 Hypothyroidism, unspecified: Secondary | ICD-10-CM | POA: Diagnosis present

## 2014-01-26 DIAGNOSIS — M5412 Radiculopathy, cervical region: Secondary | ICD-10-CM | POA: Diagnosis present

## 2014-01-26 DIAGNOSIS — Z8673 Personal history of transient ischemic attack (TIA), and cerebral infarction without residual deficits: Secondary | ICD-10-CM | POA: Diagnosis not present

## 2014-01-26 DIAGNOSIS — R200 Anesthesia of skin: Secondary | ICD-10-CM

## 2014-01-26 DIAGNOSIS — E7800 Pure hypercholesterolemia, unspecified: Secondary | ICD-10-CM | POA: Diagnosis present

## 2014-01-26 DIAGNOSIS — Z87891 Personal history of nicotine dependence: Secondary | ICD-10-CM

## 2014-01-26 DIAGNOSIS — R209 Unspecified disturbances of skin sensation: Principal | ICD-10-CM | POA: Diagnosis present

## 2014-01-26 DIAGNOSIS — Z9089 Acquired absence of other organs: Secondary | ICD-10-CM

## 2014-01-26 DIAGNOSIS — C9002 Multiple myeloma in relapse: Secondary | ICD-10-CM | POA: Diagnosis present

## 2014-01-26 DIAGNOSIS — Z801 Family history of malignant neoplasm of trachea, bronchus and lung: Secondary | ICD-10-CM

## 2014-01-26 DIAGNOSIS — G459 Transient cerebral ischemic attack, unspecified: Secondary | ICD-10-CM | POA: Diagnosis present

## 2014-01-26 DIAGNOSIS — R202 Paresthesia of skin: Secondary | ICD-10-CM

## 2014-01-26 HISTORY — DX: Pneumonia, unspecified organism: J18.9

## 2014-01-26 HISTORY — DX: Multiple myeloma not having achieved remission: C90.00

## 2014-01-26 HISTORY — DX: Cerebral infarction, unspecified: I63.9

## 2014-01-26 HISTORY — DX: Family history of other specified conditions: Z84.89

## 2014-01-26 LAB — URINALYSIS, ROUTINE W REFLEX MICROSCOPIC
BILIRUBIN URINE: NEGATIVE
Glucose, UA: NEGATIVE mg/dL
Hgb urine dipstick: NEGATIVE
Ketones, ur: NEGATIVE mg/dL
Leukocytes, UA: NEGATIVE
Nitrite: NEGATIVE
PROTEIN: NEGATIVE mg/dL
Specific Gravity, Urine: 1.034 — ABNORMAL HIGH (ref 1.005–1.030)
UROBILINOGEN UA: 0.2 mg/dL (ref 0.0–1.0)
pH: 7.5 (ref 5.0–8.0)

## 2014-01-26 LAB — CBC
HCT: 27.6 % — ABNORMAL LOW (ref 36.0–46.0)
HEMATOCRIT: 30.2 % — AB (ref 36.0–46.0)
Hemoglobin: 9.1 g/dL — ABNORMAL LOW (ref 12.0–15.0)
Hemoglobin: 9.8 g/dL — ABNORMAL LOW (ref 12.0–15.0)
MCH: 31.8 pg (ref 26.0–34.0)
MCH: 31.9 pg (ref 26.0–34.0)
MCHC: 32.5 g/dL (ref 30.0–36.0)
MCHC: 33 g/dL (ref 30.0–36.0)
MCV: 98.1 fL (ref 78.0–100.0)
MCV: 96.8 fL (ref 78.0–100.0)
PLATELETS: 252 10*3/uL (ref 150–400)
Platelets: 288 10*3/uL (ref 150–400)
RBC: 2.85 MIL/uL — ABNORMAL LOW (ref 3.87–5.11)
RBC: 3.08 MIL/uL — ABNORMAL LOW (ref 3.87–5.11)
RDW: 17.2 % — AB (ref 11.5–15.5)
RDW: 17.2 % — AB (ref 11.5–15.5)
WBC: 3.5 10*3/uL — ABNORMAL LOW (ref 4.0–10.5)
WBC: 3.9 10*3/uL — AB (ref 4.0–10.5)

## 2014-01-26 LAB — RAPID URINE DRUG SCREEN, HOSP PERFORMED
Amphetamines: NOT DETECTED
BENZODIAZEPINES: NOT DETECTED
Barbiturates: NOT DETECTED
COCAINE: NOT DETECTED
OPIATES: POSITIVE — AB
Tetrahydrocannabinol: NOT DETECTED

## 2014-01-26 LAB — I-STAT TROPONIN, ED: TROPONIN I, POC: 0 ng/mL (ref 0.00–0.08)

## 2014-01-26 LAB — COMPREHENSIVE METABOLIC PANEL
ALBUMIN: 3.3 g/dL — AB (ref 3.5–5.2)
ALT: 16 U/L (ref 0–35)
ANION GAP: 11 (ref 5–15)
AST: 12 U/L (ref 0–37)
Alkaline Phosphatase: 79 U/L (ref 39–117)
BILIRUBIN TOTAL: 1.2 mg/dL (ref 0.3–1.2)
BUN: 12 mg/dL (ref 6–23)
CHLORIDE: 102 meq/L (ref 96–112)
CO2: 25 meq/L (ref 19–32)
Calcium: 9.2 mg/dL (ref 8.4–10.5)
Creatinine, Ser: 0.74 mg/dL (ref 0.50–1.10)
GFR calc Af Amer: >90 mL/min (ref >90)
GFR, EST NON AFRICAN AMERICAN: 89 mL/min — AB (ref >90)
Glucose, Bld: 85 mg/dL (ref 70–99)
Potassium: 4 mEq/L (ref 3.7–5.3)
Sodium: 138 mEq/L (ref 137–147)
Total Protein: 6.6 g/dL (ref 6.0–8.3)

## 2014-01-26 MED ORDER — MAGNESIUM GLUCONATE 500 MG PO TABS
500.0000 mg | ORAL_TABLET | Freq: Every morning | ORAL | Status: DC
  Administered 2014-01-28 – 2014-01-29 (×2): 500 mg via ORAL
  Filled 2014-01-26 (×4): qty 1

## 2014-01-26 MED ORDER — SODIUM CHLORIDE 0.9 % IJ SOLN
3.0000 mL | Freq: Two times a day (BID) | INTRAMUSCULAR | Status: DC
Start: 2014-01-26 — End: 2014-01-29

## 2014-01-26 MED ORDER — ATORVASTATIN CALCIUM 40 MG PO TABS
40.0000 mg | ORAL_TABLET | Freq: Every day | ORAL | Status: DC
  Administered 2014-01-26 – 2014-01-28 (×3): 40 mg via ORAL
  Filled 2014-01-26 (×3): qty 1

## 2014-01-26 MED ORDER — SODIUM CHLORIDE 0.9 % IJ SOLN: 3.0000 mL | Freq: Two times a day (BID) | INTRAMUSCULAR | Status: DC

## 2014-01-26 MED ORDER — GADOBENATE DIMEGLUMINE 529 MG/ML IV SOLN
19.0000 mL | Freq: Once | INTRAVENOUS | Status: AC | PRN
  Administered 2014-01-26: 19 mL via INTRAVENOUS

## 2014-01-26 MED ORDER — MORPHINE SULFATE ER 15 MG PO TBCR
15.0000 mg | EXTENDED_RELEASE_TABLET | Freq: Two times a day (BID) | ORAL | Status: DC
  Administered 2014-01-26 – 2014-01-29 (×6): 15 mg via ORAL
  Filled 2014-01-26 (×6): qty 1

## 2014-01-26 MED ORDER — SODIUM CHLORIDE 0.9 % IJ SOLN: 3.0000 mL | INTRAMUSCULAR | Status: DC | PRN

## 2014-01-26 MED ORDER — HYDROCODONE-ACETAMINOPHEN 7.5-325 MG PO TABS
1.0000 | ORAL_TABLET | Freq: Four times a day (QID) | ORAL | Status: DC | PRN
  Administered 2014-01-28: 1 via ORAL
  Filled 2014-01-26: qty 1

## 2014-01-26 MED ORDER — POTASSIUM CHLORIDE ER 10 MEQ PO TBCR
10.0000 meq | EXTENDED_RELEASE_TABLET | Freq: Two times a day (BID) | ORAL | Status: DC
  Administered 2014-01-26 – 2014-01-29 (×6): 10 meq via ORAL
  Filled 2014-01-26 (×13): qty 1

## 2014-01-26 MED ORDER — ALUM & MAG HYDROXIDE-SIMETH 200-200-20 MG/5ML PO SUSP: 30.0000 mL | Freq: Four times a day (QID) | ORAL | Status: DC | PRN

## 2014-01-26 MED ORDER — SODIUM CHLORIDE 0.9 % IV SOLN: 250.0000 mL | INTRAVENOUS | Status: DC | PRN

## 2014-01-26 MED ORDER — PANTOPRAZOLE SODIUM 40 MG PO TBEC
40.0000 mg | DELAYED_RELEASE_TABLET | Freq: Every day | ORAL | Status: DC
  Administered 2014-01-27 – 2014-01-29 (×3): 40 mg via ORAL
  Filled 2014-01-26 (×3): qty 1

## 2014-01-26 MED ORDER — POLYETHYLENE GLYCOL 3350 17 G PO PACK: 17.0000 g | PACK | Freq: Every day | ORAL | Status: DC | PRN

## 2014-01-26 MED ORDER — LEVOTHYROXINE SODIUM 100 MCG PO TABS
100.0000 ug | ORAL_TABLET | ORAL | Status: DC
  Administered 2014-01-27 – 2014-01-29 (×2): 100 ug via ORAL
  Filled 2014-01-26 (×3): qty 1

## 2014-01-26 MED ORDER — ONDANSETRON HCL 4 MG PO TABS
4.0000 mg | ORAL_TABLET | Freq: Four times a day (QID) | ORAL | Status: DC | PRN
  Filled 2014-01-26 (×2): qty 1

## 2014-01-26 MED ORDER — ENOXAPARIN SODIUM 40 MG/0.4ML ~~LOC~~ SOLN
40.0000 mg | SUBCUTANEOUS | Status: DC
  Administered 2014-01-26 – 2014-01-28 (×3): 40 mg via SUBCUTANEOUS
  Filled 2014-01-26 (×3): qty 0.4

## 2014-01-26 MED ORDER — ZOLPIDEM TARTRATE 5 MG PO TABS: 5.0000 mg | ORAL_TABLET | Freq: Every evening | ORAL | Status: DC | PRN

## 2014-01-26 MED ORDER — FUROSEMIDE 40 MG PO TABS
40.0000 mg | ORAL_TABLET | Freq: Two times a day (BID) | ORAL | Status: DC
  Administered 2014-01-27 – 2014-01-29 (×5): 40 mg via ORAL
  Filled 2014-01-26 (×6): qty 1

## 2014-01-26 MED ORDER — ADULT MULTIVITAMIN W/MINERALS CH
1.0000 | ORAL_TABLET | Freq: Every morning | ORAL | Status: DC
  Administered 2014-01-27 – 2014-01-29 (×3): 1 via ORAL
  Filled 2014-01-26 (×5): qty 1

## 2014-01-26 MED ORDER — LEVOTHYROXINE SODIUM 50 MCG PO TABS: 50.0000 ug | ORAL_TABLET | Freq: Every day | ORAL | Status: DC

## 2014-01-26 MED ORDER — CELECOXIB 200 MG PO CAPS: 200.0000 mg | ORAL_CAPSULE | Freq: Every day | ORAL | Status: DC | PRN

## 2014-01-26 MED ORDER — ONDANSETRON HCL 4 MG/2ML IJ SOLN: 4.0000 mg | Freq: Four times a day (QID) | INTRAMUSCULAR | Status: DC | PRN

## 2014-01-26 MED ORDER — ALPRAZOLAM 0.5 MG PO TABS: 0.5000 mg | ORAL_TABLET | Freq: Three times a day (TID) | ORAL | Status: DC | PRN

## 2014-01-26 MED ORDER — LEVOTHYROXINE SODIUM 50 MCG PO TABS
50.0000 ug | ORAL_TABLET | ORAL | Status: DC
  Administered 2014-01-28: 50 ug via ORAL
  Filled 2014-01-26 (×3): qty 1

## 2014-01-26 MED ORDER — GABAPENTIN 600 MG PO TABS
600.0000 mg | ORAL_TABLET | Freq: Four times a day (QID) | ORAL | Status: DC | PRN
  Administered 2014-01-27 – 2014-01-29 (×7): 600 mg via ORAL
  Filled 2014-01-26 (×6): qty 1

## 2014-01-26 NOTE — ED Notes (Signed)
Pt transported to MRI 

## 2014-01-26 NOTE — ED Notes (Signed)
Pt returned from MRI °

## 2014-01-26 NOTE — ED Notes (Signed)
Patient is being transported to MRI. Will attempt to obtain urine sample when returned.

## 2014-01-26 NOTE — ED Notes (Signed)
[facility] here to transfer pt to [facility].

## 2014-01-26 NOTE — ED Provider Notes (Signed)
CSN: [id number]     Arrival date & time 01/26/14  1515 History   First MD Initiated Contact with Patient 01/26/14 1536     Chief Complaint  Patient presents with  . Numbness     (Consider location/radiation/quality/duration/timing/severity/associated sxs/prior Treatment) HPI 63 year old female with history of recurrent multiple myeloma currently undergoing treatment includes Carfilzomib and Decadron who presents for evaluations of numbness in hands and feet. Pt had bone marrow transplant at [facility] in 2011 and has annual visit for check up.  Today when she went to her check up she mentioned that she has been having tingling sensation to hands and feet for the past several weeks.  Further more she experienced intermittent dizzy spell lasting for a few minutes, last episode 2 weeks ago when she fell down on a chair.  She mentioned having L arm numbness that has been persistent x 6 days from the tip of her fingers to the rest of her arms.  3 days ago she noticed R leg weakness and having to drag her leg.  Leg weakness lasted a few minutes and resolved.  Aside from a mild headache and L arm tingling sensation she has no other active sxs.  Denies fever, chills, vision changes, slurring of speech, cp, sob, abd pain, n/v/d, or dysuria.  Her family members sts she has been having trouble thinking, with delay of thoughts for more than 3 weeks.  [place] recommended for pt to f/u with her PCP, Dr. Melford Aase.  Pt called her doctor's office and was told to come to ER for TIA work up.  Pt did report having a stroke last year.  Currently taking baby ASA.        Past Medical History  Diagnosis Date  . Thyroid disease Hypothyroidism  . Hypercholesterolemia   . Compression fracture 04/08/2007    pathologic compression fracture  . Hypothyroidism   . FHx: chemotherapy     s/p 5 cycle revlimid/low dose decadron,s/p velcade,doxil,decadron,  . Hx of radiation therapy 05/05/07-05/18/07,& 03/05/11-03/21/11-     l3&l5 in 2008, t2-t6 03/2011  . GERD (gastroesophageal reflux disease)   . Insomnia     associated with steroids  . Nausea   . Constipation     takes oxycontin,vicodin  . Hx of radiation therapy 05/05/2007 to 05/18/2007    palliative, L3-5  . 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 Hx of radiation therapy 03/05/2011 to 03/21/2011    palliative T2-T6, c-spine  . 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 PONV (postoperative nausea and vomiting)   . Metastasis to bone    Past Surgical History  Procedure Laterality Date  . Cholecystectomy    . Knee surgery    . Knee surgery    . Video bronchoscopy  07/30/2011    Procedure: VIDEO BRONCHOSCOPY WITHOUT FLUORO;  Surgeon: Kathee Delton, MD;  Location: [facility];  Service: Cardiopulmonary;  Laterality: Bilateral;   Family History  Problem Relation Age of Onset  . Cancer Brother     lung ca  . Cancer Maternal Uncle     colon  . Cancer Maternal Grandmother     breast ca   History  Substance Use Topics  . Smoking status: Former Smoker -- 0.25 packs/day for 6 years    Quit date: 08/27/1978  . Smokeless tobacco: Never Used  . Alcohol Use: No   OB History   Grav Para Term Preterm Abortions TAB SAB  Ect Mult Living                 Review of Systems  All other systems reviewed and are negative.     Allergies  Review of patient's allergies indicates no known allergies.  Home Medications   Prior to Admission medications   Medication Sig Start Date End Date Taking? Authorizing Provider  ALPRAZolam Duanne Moron) 0.5 MG tablet Take 0.5 mg by mouth 3 (three) times daily as needed for anxiety.    Historical Provider, MD  atorvastatin (LIPITOR) 40 MG tablet Take 40 mg by mouth every evening.    Historical Provider, MD  celecoxib (CELEBREX) 200 MG capsule Take 200 mg by mouth daily as needed for mild pain.    Historical Provider, MD  dexamethasone (DECADRON) 4 MG tablet Take 40 mg by mouth every Monday.     Historical Provider, MD  dexlansoprazole (DEXILANT) 60 MG capsule Take 60 mg by mouth every morning.    Historical Provider, MD  Eszopiclone 3 MG TABS Take 3 mg by mouth at bedtime as needed (sleep). Take immediately before bedtime    Historical Provider, MD  furosemide (LASIX) 40 MG tablet Take 40 mg by mouth 2 (two) times daily.    Historical Provider, MD  gabapentin (NEURONTIN) 600 MG tablet Take 600 mg by mouth 4 (four) times daily as needed (pain).    Historical Provider, MD  HYDROcodone-acetaminophen (NORCO) 7.5-325 MG per tablet Take 1 tablet by mouth every 6 (six) hours as needed for moderate pain. 01/06/14   Curt Bears, MD  levothyroxine (SYNTHROID, LEVOTHROID) 100 MCG tablet Take 50-100 mcg by mouth daily before breakfast. Take 0.5 tab (2mg) on Monday, Wednesday, and Friday. Take 1 tab (1056m) on Sunday, Tuesday, Thursday, and Saturday.    Historical Provider, MD  loperamide (IMODIUM) 2 MG capsule Take 2-4 mg by mouth as needed for diarrhea or loose stools.     Historical Provider, MD  magnesium gluconate (MAGONATE) 500 MG tablet Take 500 mg by mouth every morning.    Historical Provider, MD  morphine (MS CONTIN) 15 MG 12 hr tablet Take 1 tablet (15 mg total) by mouth every 12 (twelve) hours. 01/20/14   MoCurt BearsMD  Multiple Vitamin (MULITIVITAMIN WITH MINERALS) TABS Take 1 tablet by mouth every morning.     Historical Provider, MD  ondansetron (ZOFRAN-ODT) 4 MG disintegrating tablet PLACE 1 TABLET ON THE TONGUE EVERY 8 HOURS AS NEEDED FOR NAUSEA 01/12/14   [name] R [name], [name]  pomalidomide (POMALYST) 4 MG capsule Take 1 capsule (4 mg total) by mouth daily. Take with water on days 1-21. Repeat every 28 days. 12/22/13   MoCurt BearsMD  potassium chloride (K-DUR) 10 MEQ tablet Take 10 mEq by mouth 2 (two) times daily.    Historical Provider, MD  promethazine-dextromethorphan (PROMETHAZINE-DM) 6.25-15 MG/5ML syrup TAKE 5 MLS BY MOUTH 4 (FOUR) TIMES DAILY AS NEEDED FOR COUGH.     AmVicie MuttersPA-C   BP 125/41  Pulse 60  Temp(Src) 97.5 F (36.4 C) (Oral)  Resp 18  SpO2 100% Physical Exam  Nursing note and vitals reviewed. Constitutional: She is oriented to person, place, and time. She appears well-developed and well-nourished. No distress.  HENT:  Head: Atraumatic.  Mouth/Throat: Oropharynx is clear and moist.  Eyes: Conjunctivae and EOM are normal. Pupils are equal, round, and reactive to light.  Neck: Neck supple.  Cardiovascular: Normal rate and regular rhythm.   Pulmonary/Chest: Effort normal and breath sounds normal.  Abdominal: Soft.  There is no tenderness.  Neurological: She is alert and oriented to person, place, and time.  Speech clear, pupils equal round reactive to light, extraocular movements intact   Normal peripheral visual fields Cranial nerves III through XII normal including no facial droop Follows commands, moves all extremities x4, normal strength to bilateral upper and lower extremities at all major muscle groups including grip Subjective paresthesia to LUE to light touch  Coordination intact, no limp ataxia, finger-nose-finger normal Rapid alternating movements normal No pronator drift R leaning gait   Skin: No rash noted.  Psychiatric: She has a normal mood and affect.    ED Course  Procedures (including critical care time)  4:09 PM Pt sent here by PCP for TIA work up due to intermittent paresthesia to extremities, intermittent R leg drag, L arm paresthesia that has been ongoing for nearly a week.  Work up initiated.  Care discussed with Dr. Sabra Heck.    4:21 PM I have consulted with neurology and spoke with neurology hospitalist PA, Shanon Brow who request for hospital admit and transfer to [facility] for further care once work up is complete.   6:22 PM MRI without evidence of acute intracranial abnormality.  I have consulted with [facility], Dr. Ardeth Perfect who agrees to see and admit pt.    Labs Review Labs Reviewed  CBC  - Abnormal; Notable for the following:    WBC 3.9 (*)    RBC 3.08 (*)    Hemoglobin 9.8 (*)    HCT 30.2 (*)    RDW 17.2 (*)    All other components within normal limits  COMPREHENSIVE METABOLIC PANEL - Abnormal; Notable for the following:    Albumin 3.3 (*)    GFR calc non Af Amer 89 (*)    All other components within normal limits  URINE RAPID DRUG SCREEN (HOSP PERFORMED)  URINALYSIS, ROUTINE W REFLEX MICROSCOPIC  Randolm Idol, ED    Imaging Review Mr Jeri Cos Wo Contrast  01/26/2014   CLINICAL DATA:  Bilateral hand and right leg numbness. Weakness. Headaches and falls. History of multiple myeloma.  EXAM: MRI HEAD WITHOUT AND WITH CONTRAST  MRA HEAD WITHOUT CONTRAST  TECHNIQUE: Multiplanar, multiecho pulse sequences of the brain and surrounding structures were obtained without and with intravenous contrast. Angiographic images of the head were obtained using MRA technique without contrast.  CONTRAST:  76m MULTIHANCE GADOBENATE DIMEGLUMINE 529 MG/ML IV SOLN  COMPARISON:  Head CT 08/09/2013.  Head MRI/ MRA 02/24/2013.  FINDINGS: MRI HEAD FINDINGS  There is no acute infarct, intracranial hemorrhage, midline shift, or extra-axial fluid collection. Numerous small foci of T2 hyperintensity in the cerebral white matter are similar to the prior MRI and nonspecific but may reflect mild chronic small vessel ischemic disease and/or posttherapy changes. No abnormal brain parenchymal enhancement is seen.  Large lesion involving the right parietal skull with extension to the underlying dura has not significantly changed in size, measuring 3.9 x 1.1 cm. Small lesions in the left parietal bone are also unchanged. Lesion in the C2 vertebral body is also grossly unchanged. Orbits are unremarkable. Small left maxillary sinus mucous retention cyst is noted. Mastoid air cells are clear. Major intracranial vascular flow voids are preserved.  MRA HEAD FINDINGS  Visualized distal vertebral arteries are patent and  codominant. Right PICA origin is patent. Left PICA is not clearly identified. Bilateral AICA and SCA origins are patent. Basilar artery is patent without stenosis. PCAs are unremarkable aside from mild right greater than left branch  vessel irregularity, which may reflect mild atherosclerosis that is accentuated by mild motion on this study. Internal carotid arteries are patent from skullbase to carotid termini. MCAs and ACAs are unremarkable. No intracranial aneurysm is identified.  IMPRESSION: 1. No evidence of acute intracranial abnormality. Mild chronic white matter changes. 2. Unchanged skull lesions, consistent with multiple myeloma, including a large right parietal lesion with involvement of the underlying dura. 3. No evidence of major intracranial arterial occlusion or proximal stenosis.   Electronically Signed   By: Logan Bores   On: 01/26/2014 17:49   Mr Jodene Nam Head/brain Wo Cm  01/26/2014   CLINICAL DATA:  Bilateral hand and right leg numbness. Weakness. Headaches and falls. History of multiple myeloma.  EXAM: MRI HEAD WITHOUT AND WITH CONTRAST  MRA HEAD WITHOUT CONTRAST  TECHNIQUE: Multiplanar, multiecho pulse sequences of the brain and surrounding structures were obtained without and with intravenous contrast. Angiographic images of the head were obtained using MRA technique without contrast.  CONTRAST:  50m MULTIHANCE GADOBENATE DIMEGLUMINE 529 MG/ML IV SOLN  COMPARISON:  Head CT 08/09/2013.  Head MRI/ MRA 02/24/2013.  FINDINGS: MRI HEAD FINDINGS  There is no acute infarct, intracranial hemorrhage, midline shift, or extra-axial fluid collection. Numerous small foci of T2 hyperintensity in the cerebral white matter are similar to the prior MRI and nonspecific but may reflect mild chronic small vessel ischemic disease and/or posttherapy changes. No abnormal brain parenchymal enhancement is seen.  Large lesion involving the right parietal skull with extension to the underlying dura has not significantly  changed in size, measuring 3.9 x 1.1 cm. Small lesions in the left parietal bone are also unchanged. Lesion in the C2 vertebral body is also grossly unchanged. Orbits are unremarkable. Small left maxillary sinus mucous retention cyst is noted. Mastoid air cells are clear. Major intracranial vascular flow voids are preserved.  MRA HEAD FINDINGS  Visualized distal vertebral arteries are patent and codominant. Right PICA origin is patent. Left PICA is not clearly identified. Bilateral AICA and SCA origins are patent. Basilar artery is patent without stenosis. PCAs are unremarkable aside from mild right greater than left branch vessel irregularity, which may reflect mild atherosclerosis that is accentuated by mild motion on this study. Internal carotid arteries are patent from skullbase to carotid termini. MCAs and ACAs are unremarkable. No intracranial aneurysm is identified.  IMPRESSION: 1. No evidence of acute intracranial abnormality. Mild chronic white matter changes. 2. Unchanged skull lesions, consistent with multiple myeloma, including a large right parietal lesion with involvement of the underlying dura. 3. No evidence of major intracranial arterial occlusion or proximal stenosis.   Electronically Signed   By: ALogan Bores  On: 01/26/2014 17:49     EKG Interpretation   Date/Time:  Wednesday January 26 2014 16:28:42 EDT Ventricular Rate:  48 PR Interval:  157 QRS Duration: 92 QT Interval:  483 QTC Calculation: 432 R Axis:   18 Text Interpretation:  Sinus bradycardia ECG OTHERWISE WITHIN NORMAL LIMITS  Since last tracing rate slower Confirmed by MILLER  MD, BRIAN ([id number] on  01/26/2014 4:33:29 PM      MDM   Final diagnoses:  Transient cerebral ischemia, unspecified transient cerebral ischemia type    BP 117/60  Pulse 53  Temp(Src) 97.4 F (36.3 C) (Oral)  Resp 16  SpO2 98%  I have reviewed nursing notes and vital signs. I personally reviewed the imaging tests through PACS system  I  reviewed available ER/hospitalization records thought the EMR  Domenic Moras, [name] 01/26/14 1826

## 2014-01-26 NOTE — Progress Notes (Signed)
  CARE MANAGEMENT ED NOTE 01/26/2014  Patient:  [name], [name]   Account Number:  [id number]  Date Initiated:  01/26/2014  Documentation initiated by:  Livia Snellen  Subjective/Objective Assessment:   Patient presents to Ed with numbness to bilateral hands and right leg, headaches and falls     Subjective/Objective Assessment Detail:     Action/Plan:   Action/Plan Detail:   Anticipated DC Date:       Status Recommendation to Physician:   Result of Recommendation:    Other ED Services  Consult Working [place]  Other    Choice offered to / List presented to:  C[facility] [place].    Status of service:  Completed, signed off  ED Comments:   ED Comments Detail:  EDCM spoke to patient and her daughter [name] at bedside.  [name]'s phone [phone].  Patient's son's phone number [name] [phone].  Patient lives alone. Patient reports her children rotate checking in on her every day.  Patient has a walker and a bedside commode at home.  Patient reports that she is able to perform her ADL's on her own without difficulty.  Patient reports she is currently active with home health services with [facility] with an RN only.  EDCM provided patient with list of home health agencies of [place].  EDCM also provided patient with list of private duty nursing agencies and informed patient it would be an out of pocket expense for her.  All printed information given to patient's daughter.  Patient and daughter thankful for resources.  No further EDCM needs at this time.

## 2014-01-26 NOTE — ED Notes (Signed)
[facility] at bedside 

## 2014-01-26 NOTE — ED Provider Notes (Signed)
63 year old female with a history of recent onset of tingling and numbness scattered across the upper and lower extremities as well as an unstable gait sent from her family doctor's office for evaluation for stroke or TIA. On exam the patient has no drift, no focal weakness, normal cranial nerves III through XII but does seem to have a gait favoring her right side.  No murmurs, normal lung sounds, no JVD or carotid bruits. She will need labs, MRI, discussed with neurology.  Medical screening examination/treatment/procedure(s) were conducted as a shared visit with non-physician practitioner(s) and myself.  I personally evaluated the patient during the encounter.        Johnna Acosta, MD 01/26/14 [phone]

## 2014-01-26 NOTE — ED Notes (Signed)
Patient is made aware that a urine sample is needed. Patient is encouraged to void when able.

## 2014-01-26 NOTE — ED Notes (Signed)
Pt states that she had a bone marrow transplant in 2011.  Was in [place] this morning for a check up for her bone marrow transplant.  Pt told them about some numbness that she has had in bilateral hands and right leg.  States that she has been having headaches and that she has been falling.  Started 2 wks ago and has gotten progressively worse.

## 2014-01-26 NOTE — H&P (Signed)
[facility]  History and Physical Mackinzee Roszak L. Ardeth Perfect, MD Pager [phone] (if 7P to 7A, page night hospitalist on [email or web address] KONCZAL JEH:[id number] DOB: 07-Jan-1951 DOA: 01/26/2014  Referring physician: ED  PCP: [name], MD   Chief Complaint: R leg paraesthesia and weakness, L arm paraesthesia, tongue paraesthesia   HPI:  Pt w/ know hx of MM that is s/p BM xplant at [facility] in the past and currently followed and treated by Dr Julien Nordmann who presents w/ ssx since Sunday night. Starting having weakness of the R leg that lead to difficulty walking .  This weakness has persisted and now has progressed into paraesthesia of the R leg, L arm and tongue. She has no other complaints. She went to her xplant MD at [facility] who states she should have this r/o'd for TIA and so she presented to [facility] hospital. Neuro was contacted per ED PA who stated that the patient needs to be transferred to [facility] for further w/u. MRI/MRA brain was performed that showed no evidence of AICA. Chronic changes indicative of MM.  She will be admitted for TIA w/u   Chart Review:  Reviewed prior oncology notes   Review of Systems:  Neg except as noted above   Past Medical History  Diagnosis Date  . Thyroid disease Hypothyroidism  . Hypercholesterolemia   . Compression fracture 04/08/2007    pathologic compression fracture  . Hypothyroidism   . FHx: chemotherapy     s/p 5 cycle revlimid/low dose decadron,s/p velcade,doxil,decadron,  . Hx of radiation therapy 05/05/07-05/18/07,& 03/05/11-03/21/11-    l3&l5 in 2008, t2-t6 03/2011  . GERD (gastroesophageal reflux disease)   . Insomnia     associated with steroids  . Nausea   . Constipation     takes oxycontin,vicodin  . Hx of radiation therapy 05/05/2007 to 05/18/2007    palliative, L3-5  . 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 Hx of radiation therapy 03/05/2011 to 03/21/2011    palliative T2-T6, c-spine  . 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 PONV (postoperative nausea and vomiting)   . Metastasis to bone     Past Surgical History  Procedure Laterality Date  . Cholecystectomy    . Knee surgery    . Knee surgery    . Video bronchoscopy  07/30/2011    Procedure: VIDEO BRONCHOSCOPY WITHOUT FLUORO;  Surgeon: Kathee Delton, MD;  Location: [facility];  Service: Cardiopulmonary;  Laterality: Bilateral;    Social History:  reports that she quit smoking about 35 years ago. She has never used smokeless tobacco. She reports that she does not drink alcohol or use illicit drugs.  No Known Allergies  Family History  Problem Relation Age of Onset  . Cancer Brother     lung ca  . Cancer Maternal Uncle     colon  . Cancer Maternal Grandmother     breast ca     Prior to Admission medications   Medication Sig Start Date End Date Taking? Authorizing Provider  ALPRAZolam Duanne Moron) 0.5 MG tablet Take 0.5 mg by mouth 3 (three) times daily as needed for anxiety.   Yes Historical Provider, MD  atorvastatin (LIPITOR) 40 MG tablet Take 40 mg by mouth every evening.   Yes Historical Provider, MD  celecoxib (CELEBREX) 200 MG capsule Take 200 mg by mouth daily as needed for mild pain.   Yes Historical Provider, MD  dexamethasone (DECADRON) 4 MG tablet Take  40 mg by mouth every Friday.    Yes Historical Provider, MD  dexlansoprazole (DEXILANT) 60 MG capsule Take 60 mg by mouth every morning.   Yes Historical Provider, MD  Eszopiclone 3 MG TABS Take 3 mg by mouth at bedtime as needed (sleep). Take immediately before bedtime   Yes Historical Provider, MD  furosemide (LASIX) 40 MG tablet Take 40 mg by mouth 2 (two) times daily.   Yes Historical Provider, MD  gabapentin (NEURONTIN) 600 MG tablet Take 600 mg by mouth 4 (four) times daily as needed (pain).   Yes Historical Provider, MD  HYDROcodone-acetaminophen (NORCO) 7.5-325 MG per tablet Take 1 tablet by mouth every 6 (six) hours as needed for moderate pain. 01/06/14  Yes  Curt Bears, MD  levothyroxine (SYNTHROID, LEVOTHROID) 100 MCG tablet Take 50-100 mcg by mouth daily before breakfast. Take 0.5 tab (67mg) on Monday, Wednesday, and Friday. Take 1 tab (1058m) on Sunday, Tuesday, Thursday, and Saturday.   Yes Historical Provider, MD  magnesium gluconate (MAGONATE) 500 MG tablet Take 500 mg by mouth every morning.   Yes Historical Provider, MD  morphine (MS CONTIN) 15 MG 12 hr tablet Take 1 tablet (15 mg total) by mouth every 12 (twelve) hours. 01/20/14  Yes MoCurt BearsMD  Multiple Vitamin (MULITIVITAMIN WITH MINERALS) TABS Take 1 tablet by mouth every morning.    Yes Historical Provider, MD  ondansetron (ZOFRAN-ODT) 4 MG disintegrating tablet PLACE 1 TABLET ON THE TONGUE EVERY 8 HOURS AS NEEDED FOR NAUSEA 01/12/14  Yes [name] R [name], [name]  pomalidomide (POMALYST) 4 MG capsule Take 1 capsule (4 mg total) by mouth daily. Take with water on days 1-21. Repeat every 28 days. 12/22/13  Yes MoCurt BearsMD  potassium chloride (K-DUR) 10 MEQ tablet Take 10 mEq by mouth 2 (two) times daily.   Yes Historical Provider, MD  promethazine-dextromethorphan (PROMETHAZINE-DM) 6.25-15 MG/5ML syrup TAKE 5 MLS BY MOUTH 4 (FOUR) TIMES DAILY AS NEEDED FOR COUGH.   Yes AmVicie MuttersPA-C  sennosides-docusate sodium (SENOKOT-S) 8.6-50 MG tablet Take 1 tablet by mouth daily.   Yes Historical Provider, MD   Physical Exam: Filed Vitals:   01/26/14 1529 01/26/14 1750  BP: 125/41 117/60  Pulse: 60 53  Temp: 97.5 F (36.4 C) 97.4 F (36.3 C)  TempSrc: Oral Oral  Resp: 18 16  SpO2: 100% 98%     General:  AAF in NAD   HEENT:  Anicteric, MMM, no JVD, trachea midline   Cardiovascular: RRR, no MRG   Respiratory: CTAB , no wheezes   Abdomen: soft, NT, ND   Skin: warm, dry   Musculoskeletal: 5/5 strength in UE/LE . No focal deficits   Psychiatric: intact   Neurologic: CN II-XII grossly intact   Wt Readings from Last 3 Encounters:  01/06/14 192 lb 3.2 oz  (87.181 kg)  12/23/13 193 lb 6.4 oz (87.726 kg)  12/09/13 197 lb 11.2 oz (89.676 kg)    Labs on Admission:  Basic Metabolic Panel:  Recent Labs Lab 01/26/14 1613  NA 138  K 4.0  CL 102  CO2 25  GLUCOSE 85  BUN 12  CREATININE 0.74  CALCIUM 9.2    Liver Function Tests:  Recent Labs Lab 01/26/14 1613  AST 12  ALT 16  ALKPHOS 79  BILITOT 1.2  PROT 6.6  ALBUMIN 3.3*   No results found for this basename: LIPASE, AMYLASE,  in the last 168 hours No results found for this basename: AMMONIA,  in the last 168  hours  CBC:  Recent Labs Lab 01/26/14 1613  WBC 3.9*  HGB 9.8*  HCT 30.2*  MCV 98.1  PLT 288    Cardiac Enzymes: No results found for this basename: CKTOTAL, CKMB, CKMBINDEX, TROPONINI,  in the last 168 hours  Troponin (Point of Care Test)  Recent Labs  01/26/14 1625  TROPIPOC 0.00    BNP (last 3 results)  Recent Labs  05/01/13 1214 08/30/13 1815  PROBNP 130.0* 12.6    CBG: No results found for this basename: GLUCAP,  in the last 168 hours   Radiological Exams on Admission: Mr Jeri Cos Wo Contrast  01/26/2014   CLINICAL DATA:  Bilateral hand and right leg numbness. Weakness. Headaches and falls. History of multiple myeloma.  EXAM: MRI HEAD WITHOUT AND WITH CONTRAST  MRA HEAD WITHOUT CONTRAST  TECHNIQUE: Multiplanar, multiecho pulse sequences of the brain and surrounding structures were obtained without and with intravenous contrast. Angiographic images of the head were obtained using MRA technique without contrast.  CONTRAST:  58m MULTIHANCE GADOBENATE DIMEGLUMINE 529 MG/ML IV SOLN  COMPARISON:  Head CT 08/09/2013.  Head MRI/ MRA 02/24/2013.  FINDINGS: MRI HEAD FINDINGS  There is no acute infarct, intracranial hemorrhage, midline shift, or extra-axial fluid collection. Numerous small foci of T2 hyperintensity in the cerebral white matter are similar to the prior MRI and nonspecific but may reflect mild chronic small vessel ischemic disease and/or  posttherapy changes. No abnormal brain parenchymal enhancement is seen.  Large lesion involving the right parietal skull with extension to the underlying dura has not significantly changed in size, measuring 3.9 x 1.1 cm. Small lesions in the left parietal bone are also unchanged. Lesion in the C2 vertebral body is also grossly unchanged. Orbits are unremarkable. Small left maxillary sinus mucous retention cyst is noted. Mastoid air cells are clear. Major intracranial vascular flow voids are preserved.  MRA HEAD FINDINGS  Visualized distal vertebral arteries are patent and codominant. Right PICA origin is patent. Left PICA is not clearly identified. Bilateral AICA and SCA origins are patent. Basilar artery is patent without stenosis. PCAs are unremarkable aside from mild right greater than left branch vessel irregularity, which may reflect mild atherosclerosis that is accentuated by mild motion on this study. Internal carotid arteries are patent from skullbase to carotid termini. MCAs and ACAs are unremarkable. No intracranial aneurysm is identified.  IMPRESSION: 1. No evidence of acute intracranial abnormality. Mild chronic white matter changes. 2. Unchanged skull lesions, consistent with multiple myeloma, including a large right parietal lesion with involvement of the underlying dura. 3. No evidence of major intracranial arterial occlusion or proximal stenosis.   Electronically Signed   By: ALogan Bores  On: 01/26/2014 17:49   Mr MJodene NamHead/brain Wo Cm  01/26/2014   CLINICAL DATA:  Bilateral hand and right leg numbness. Weakness. Headaches and falls. History of multiple myeloma.  EXAM: MRI HEAD WITHOUT AND WITH CONTRAST  MRA HEAD WITHOUT CONTRAST  TECHNIQUE: Multiplanar, multiecho pulse sequences of the brain and surrounding structures were obtained without and with intravenous contrast. Angiographic images of the head were obtained using MRA technique without contrast.  CONTRAST:  168mMULTIHANCE GADOBENATE  DIMEGLUMINE 529 MG/ML IV SOLN  COMPARISON:  Head CT 08/09/2013.  Head MRI/ MRA 02/24/2013.  FINDINGS: MRI HEAD FINDINGS  There is no acute infarct, intracranial hemorrhage, midline shift, or extra-axial fluid collection. Numerous small foci of T2 hyperintensity in the cerebral white matter are similar to the prior MRI and nonspecific but may  reflect mild chronic small vessel ischemic disease and/or posttherapy changes. No abnormal brain parenchymal enhancement is seen.  Large lesion involving the right parietal skull with extension to the underlying dura has not significantly changed in size, measuring 3.9 x 1.1 cm. Small lesions in the left parietal bone are also unchanged. Lesion in the C2 vertebral body is also grossly unchanged. Orbits are unremarkable. Small left maxillary sinus mucous retention cyst is noted. Mastoid air cells are clear. Major intracranial vascular flow voids are preserved.  MRA HEAD FINDINGS  Visualized distal vertebral arteries are patent and codominant. Right PICA origin is patent. Left PICA is not clearly identified. Bilateral AICA and SCA origins are patent. Basilar artery is patent without stenosis. PCAs are unremarkable aside from mild right greater than left branch vessel irregularity, which may reflect mild atherosclerosis that is accentuated by mild motion on this study. Internal carotid arteries are patent from skullbase to carotid termini. MCAs and ACAs are unremarkable. No intracranial aneurysm is identified.  IMPRESSION: 1. No evidence of acute intracranial abnormality. Mild chronic white matter changes. 2. Unchanged skull lesions, consistent with multiple myeloma, including a large right parietal lesion with involvement of the underlying dura. 3. No evidence of major intracranial arterial occlusion or proximal stenosis.   Electronically Signed   By: Logan Bores   On: 01/26/2014 17:49      Active Problems:   TIA (transient ischemic attack)   Assessment/Plan 1. TIA -  MRI/MRA performed. Will order TTE, CUS, tele. Check A1C, TSH, lipids. [facility] of [facility]y for further w/u  2. MM - Dr Julien Nordmann made aware of pt. She states in the middle of chemo holiday so no treatment necessary at this time  3.   Code Status: full Family Communication: at bedside  Disposition Plan/Anticipated LOS: home 2-3 days   Time spent: 46 minutes  Velna Hatchet, MD  Internal Medicine Pager [phone] If 7PM-7AM, please contact night-coverage at [email or web address], password [facility] 01/26/2014, 7:16 PM

## 2014-01-27 ENCOUNTER — Ambulatory Visit: Payer: Medicare Other | Admitting: Internal Medicine

## 2014-01-27 ENCOUNTER — Other Ambulatory Visit: Payer: Medicare Other

## 2014-01-27 ENCOUNTER — Telehealth: Payer: Self-pay | Admitting: Medical Oncology

## 2014-01-27 ENCOUNTER — Encounter (HOSPITAL_COMMUNITY): Payer: Self-pay | Admitting: General Practice

## 2014-01-27 DIAGNOSIS — R209 Unspecified disturbances of skin sensation: Principal | ICD-10-CM

## 2014-01-27 LAB — BASIC METABOLIC PANEL
Anion gap: 10 (ref 5–15)
BUN: 10 mg/dL (ref 6–23)
CALCIUM: 8.7 mg/dL (ref 8.4–10.5)
CO2: 25 meq/L (ref 19–32)
Chloride: 106 mEq/L (ref 96–112)
Creatinine, Ser: 0.75 mg/dL (ref 0.50–1.10)
GFR calc Af Amer: >90 mL/min (ref >90)
GFR, EST NON AFRICAN AMERICAN: 89 mL/min — AB (ref >90)
GLUCOSE: 104 mg/dL — AB (ref 70–99)
Potassium: 4.1 mEq/L (ref 3.7–5.3)
Sodium: 141 mEq/L (ref 137–147)

## 2014-01-27 LAB — LIPID PANEL
CHOL/HDL RATIO: 1.7 ratio
Cholesterol: 99 mg/dL (ref 0–200)
HDL: 58 mg/dL (ref >39)
LDL Cholesterol: 22 mg/dL (ref 0–99)
Triglycerides: 94 mg/dL (ref <150)
VLDL: 19 mg/dL (ref 0–40)

## 2014-01-27 LAB — CBC
HCT: 28.2 % — ABNORMAL LOW (ref 36.0–46.0)
Hemoglobin: 9.1 g/dL — ABNORMAL LOW (ref 12.0–15.0)
MCH: 31.4 pg (ref 26.0–34.0)
MCHC: 32.3 g/dL (ref 30.0–36.0)
MCV: 97.2 fL (ref 78.0–100.0)
Platelets: 278 10*3/uL (ref 150–400)
RBC: 2.9 MIL/uL — ABNORMAL LOW (ref 3.87–5.11)
RDW: 17.4 % — AB (ref 11.5–15.5)
WBC: 3.4 10*3/uL — ABNORMAL LOW (ref 4.0–10.5)

## 2014-01-27 LAB — CREATININE, SERUM
CREATININE: 0.69 mg/dL (ref 0.50–1.10)
GFR calc Af Amer: >90 mL/min (ref >90)
GFR calc non Af Amer: >90 mL/min (ref >90)

## 2014-01-27 LAB — MAGNESIUM: MAGNESIUM: 1.9 mg/dL (ref 1.5–2.5)

## 2014-01-27 LAB — HEMOGLOBIN A1C
Hgb A1c MFr Bld: 6.4 % — ABNORMAL HIGH (ref <5.7)
MEAN PLASMA GLUCOSE: 137 mg/dL — AB (ref <117)

## 2014-01-27 LAB — TSH: TSH: 4.38 u[IU]/mL (ref 0.350–4.500)

## 2014-01-27 MED ORDER — SODIUM CHLORIDE 0.9 % IJ SOLN
10.0000 mL | INTRAMUSCULAR | Status: DC | PRN
  Administered 2014-01-27: 30 mL
  Administered 2014-01-29: 10 mL

## 2014-01-27 NOTE — Evaluation (Signed)
Physical Therapy Evaluation Patient Details Name: [name] MRN: [id number] DOB: Dec 02, 1950 Today's Date: 01/27/2014   History of Present Illness  Adm 01/26/14 with RLE, LUE, tongue parasthesias. Pt currently undergoing chemotherapy for multiple myeloma and has had bil hand and feet parasthesias, however this has now progressed into lower legs and Lt arm and tongue. Additional PMHx- pathological compression fracture  Clinical Impression  Pt admitted with above symptoms. Thus far, MRI negative for acute changes. Overall, she has a mild increased risk for falling based on her Berg Balance Scale score (41/56),and with her eyes closed she does have the sense that she's falling backwards (eventhough she is not). Pt currently with functional limitations due to the deficits listed below (see PT Problem List).  Pt will benefit from skilled PT to increase their independence and safety with mobility to allow discharge to the venue listed below.       Follow Up Recommendations Home health PT;Supervision - Intermittent    Equipment Recommendations  None recommended by PT    Recommendations for Other Services       Precautions / Restrictions Precautions Precautions: Fall Restrictions Weight Bearing Restrictions: No      Mobility  Bed Mobility Overal bed mobility: Modified Independent             General bed mobility comments: HOB elevated  Transfers Overall transfer level: Needs assistance Equipment used: None Transfers: Sit to/from Stand Sit to Stand: Supervision         General transfer comment: supervision due to h/o dizziness with near fall (daughter caught her); no unsteadiness or Loss of balance   Ambulation/Gait Ambulation/Gait assistance: Min guard Ambulation Distance (Feet): 200 Feet Assistive device: None Gait Pattern/deviations: Step-through pattern;Decreased stride length Gait velocity: decr   General Gait Details: able to maintain straight path, even  with head turns  Financial trader Rankin (Stroke Patients Only) Modified Rankin (Stroke Patients Only) Pre-Morbid Rankin Score: No significant disability Modified Rankin: Moderately severe disability     Balance Overall balance assessment:  (see Berg)                               Standardized Balance Assessment Standardized Balance Assessment : Berg Balance Test Berg Balance Test Sit to Stand: Able to stand without using hands and stabilize independently Standing Unsupported: Able to stand 2 minutes with supervision Sitting with Back Unsupported but Feet Supported on Floor or Stool: Able to sit safely and securely 2 minutes Stand to Sit: Sits safely with minimal use of hands Transfers: Able to transfer safely, minor use of hands Standing Unsupported with Eyes Closed: Able to stand 10 seconds with supervision (pt pops eyes open with sense of post LOB; didn't actually) Standing Ubsupported with Feet Together: Able to place feet together independently and stand for 1 minute with supervision From Standing, Reach Forward with Outstretched Arm: Can reach forward >12 cm safely (5") From Standing Position, Pick up Object from Floor: Able to pick up shoe safely and easily From Standing Position, Turn to Look Behind Over each Shoulder: Turn sideways only but maintains balance Turn 360 Degrees: Able to turn 360 degrees safely but slowly Standing Unsupported, Alternately Place Feet on Step/Stool: Able to complete 4 steps without aid or supervision Standing Unsupported, One Foot in Front: Able to take small step independently and hold 30 seconds Standing on  One Leg: Tries to lift leg/unable to hold 3 seconds but remains standing independently Total Score: 41         Pertinent Vitals/Pain Feet uncomfortable due to parasthesias    Home Living Family/patient expects to be discharged to:: Private residence Living Arrangements:  Alone Available Help at Discharge: Family;Available PRN/intermittently (son, works 7p-7a) Type of Home: House Home Access: Stairs to enter Entrance Stairs-Rails: Right;Left;Can reach both Technical brewer of Steps: 3 Home Layout: One level Home Equipment: Environmental consultant - 2 wheels;Walker - 4 wheels;Bedside commode      Prior Function Level of Independence: Independent         Comments: prior to this episode of weakness independent; recently furniture walking     Hand Dominance   Dominant Hand: Right    Extremity/Trunk Assessment   Upper Extremity Assessment: RUE deficits/detail;LUE deficits/detail RUE Deficits / Details: parathesia in hand only     LUE Deficits / Details: parasthesia in hand and forearm   Lower Extremity Assessment: RLE deficits/detail;LLE deficits/detail RLE Deficits / Details: parathesia foot to below knee; toe extensors 5/5, knee extension 4/5, knee flexion 3+/5 LLE Deficits / Details: Parasthesia foot to below knee; toe extensors 5/5, knee extension 3+/5 (with variable effort over multiple trials), knee flexion 3/5  Cervical / Trunk Assessment: Normal  Communication   Communication: No difficulties  Cognition Arousal/Alertness: Awake/alert Behavior During Therapy: WFL for tasks assessed/performed Overall Cognitive Status: Within Functional Limits for tasks assessed                      General Comments General comments (skin integrity, edema, etc.): pt reports she had HHPT in the past after 2 episodes of pneumonia with hospitalization. Agrees to HHPT referral    Exercises        Assessment/Plan    PT Assessment Patient needs continued PT services  PT Diagnosis Difficulty walking   PT Problem List Decreased strength;Decreased balance;Decreased mobility;Decreased knowledge of use of DME;Impaired sensation  PT Treatment Interventions DME instruction;Gait training;Stair training;Functional mobility training;Therapeutic  activities;Balance training;Neuromuscular re-education;Patient/family education   PT Goals (Current goals can be found in the Care Plan section) Acute Rehab PT Goals Patient Stated Goal: ambulate without a devicer PT Goal Formulation: With patient Time For Goal Achievement: 01/30/14 Potential to Achieve Goals: Good    Frequency Min 3X/week   Barriers to discharge Decreased caregiver support      Co-evaluation               End of Session Equipment Utilized During Treatment: Gait belt Activity Tolerance: Patient tolerated treatment well Patient left: in bed;with family/visitor present;with call bell/phone within reach Nurse Communication: Mobility status         Time: 9784-7841 PT Time Calculation (min): 29 min   Charges:   PT Evaluation $Initial PT Evaluation Tier I: 1 Procedure PT Treatments $Gait Training: 8-22 mins   PT G Codes:          Brode Sculley 26-Feb-2014, 11:09 AM Pager [phone]

## 2014-01-27 NOTE — Telephone Encounter (Signed)
I instructed pt to call us when she is discharged to r/s f/u.

## 2014-01-27 NOTE — Progress Notes (Signed)
  Echocardiogram 2D Echocardiogram has been performed.  [name] [name] 01/27/2014, 4:18 PM

## 2014-01-27 NOTE — Consult Note (Signed)
Referring Physician:  [facility]    Chief Complaint: left sided paresthesias, right leg weakness.  HPI:                                                                                                                                         [name] is an 63 y.o. female with a past medical history that is relevant for MM s/p BM transplant in 2011 at [facility] currently undergoing treatment with Carfilzomib and Decadron, who initially presented to [facility] with the above stated complains and was subsequently transferred to [facility] for further evaluation and management. She indicated that she has been experiencing tingling and numbness in her feet and hands for quite some time, but this past Thursday or Friday " things changed" and these symptoms moved all the way up to to the entire left arm including the axilla as well as the lower lateral neck. In addition she reports some " tingling" of her tongue. She said that the left arm and tongue still feels " tingly" off and on, but much improved. [name] tells me that 2 or 3 days after the onset of worsening numbness/tingling, the right leg became " painful and weak" and she was not able to use to the right leg for approximately 25 to 30 minutes. The weakness and pain of the right leg eventually improved but she still couldn't walk properly. She said that several days before those symptoms, she sustained a transient episode of " room spinning". Complains of HA but denies double vision, difficulty swallowing, slurred speech, language or vision impairment. Takes aspirin daily. MRI brain with and without contrast showed no acute abnormality but stable appearance of large lesion involving the right parietal skull with extension to the underlying dura measuring  3.9 x 1.1 cm. Small lesions in the left parietal bone are also unchanged. Lesion in the C2 vertebral body is also grossly unchanged. MRA brain unremarkable. She said that her left arm and tongue  tingling are is not completely gone.  Date last known well: uncertain  Time last known well: uncertain tPA Given: no, out of the window   Past Medical History  Diagnosis Date  . Thyroid disease Hypothyroidism  . Hypercholesterolemia   . Compression fracture 04/08/2007    pathologic compression fracture  . Hypothyroidism   . FHx: chemotherapy     s/p 5 cycle revlimid/low dose decadron,s/p velcade,doxil,decadron,  . Hx of radiation therapy 05/05/07-05/18/07,& 03/05/11-03/21/11-    l3&l5 in 2008, t2-t6 03/2011  . GERD (gastroesophageal reflux disease)   . Insomnia     associated with steroids  . Nausea   . Constipation     takes oxycontin,vicodin  . Hx of radiation therapy 05/05/2007 to 05/18/2007    palliative, L3-5  . 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 Hx of radiation therapy 03/05/2011 to 03/21/2011    palliative T2-T6, c-spine  .  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 PONV (postoperative nausea and vomiting)   . Metastasis to bone     Past Surgical History  Procedure Laterality Date  . Cholecystectomy    . Knee surgery    . Knee surgery    . Video bronchoscopy  07/30/2011    Procedure: VIDEO BRONCHOSCOPY WITHOUT FLUORO;  Surgeon: Kathee Delton, MD;  Location: [facility];  Service: Cardiopulmonary;  Laterality: Bilateral;    Family History  Problem Relation Age of Onset  . Cancer Brother     lung ca  . Cancer Maternal Uncle     colon  . Cancer Maternal Grandmother     breast ca   Social History:  reports that she quit smoking about 35 years ago. She has never used smokeless tobacco. She reports that she does not drink alcohol or use illicit drugs.  Allergies: No Known Allergies  Medications:                                                                                                                           Scheduled: . atorvastatin  40 mg Oral q1800  . enoxaparin (LOVENOX) injection  40 mg Subcutaneous Q24H  .  furosemide  40 mg Oral BID  . levothyroxine  100 mcg Oral Once per day on Sun Tue Thu Sat  . [START ON 01/28/2014] levothyroxine  50 mcg Oral Once per day on Mon Wed Fri  . magnesium gluconate  500 mg Oral q morning - 10a  . morphine  15 mg Oral Q12H  . multivitamin with minerals  1 tablet Oral q morning - 10a  . pantoprazole  40 mg Oral Daily  . potassium chloride  10 mEq Oral BID  . sodium chloride  3 mL Intravenous Q12H  . sodium chloride  3 mL Intravenous Q12H    ROS:                                                                                                                                       History obtained from the patient and chart review.  General ROS: negative for - chills, fatigue, fever, night sweats, or weight loss Psychological ROS: negative for - behavioral disorder, hallucinations, memory difficulties, mood swings or suicidal ideation Ophthalmic ROS: negative for - blurry vision, double vision, eye pain  or loss of vision ENT ROS: negative for - epistaxis, nasal discharge, oral lesions, sore throat, tinnitus or vertigo Allergy and Immunology ROS: negative for - hives or itchy/watery eyes Hematological and Lymphatic ROS: negative for - bleeding problems, bruising or swollen lymph nodes Endocrine ROS: negative for - galactorrhea, hair pattern changes, polydipsia/polyuria or temperature intolerance Respiratory ROS: negative for - cough, hemoptysis, shortness of breath or wheezing Cardiovascular ROS: negative for - chest pain, dyspnea on exertion, edema or irregular heartbeat Gastrointestinal ROS: negative for - abdominal pain, diarrhea, hematemesis, nausea/vomiting or stool incontinence Genito-Urinary ROS: negative for - dysuria, hematuria, incontinence or urinary frequency/urgency Musculoskeletal ROS: negative for - joint swelling or muscular weakness Neurological ROS: as noted in HPI Dermatological ROS: negative for rash and skin lesion changes  Physical exam:  pleasant female in no apparent distress. Blood pressure 123/55, pulse 53, temperature 97.9 F (36.6 C), temperature source Oral, resp. rate 18, height 5' 4"  (1.626 m), weight 87.816 kg (193 lb 9.6 oz), SpO2 97.00%. Head: normocephalic. Neck: supple, no bruits, no JVD. Cardiac: no murmurs. Lungs: clear. Abdomen: soft, no tender, no mass. Extremities: no edema.  Neurologic Examination:                                                                                                      General: Mental Status: Alert, oriented, thought content appropriate.  Speech fluent without evidence of aphasia.  Able to follow 3 step commands without difficulty. Cranial Nerves: II: Discs flat bilaterally; Visual fields grossly normal, pupils equal, round, reactive to light and accommodation III,IV, VI: ptosis not present, extra-ocular motions intact bilaterally V,VII: smile symmetric, facial light touch sensation normal bilaterally VIII: hearing normal bilaterally IX,X: gag reflex present XI: bilateral shoulder shrug XII: midline tongue extension without atrophy or fasciculations Motor: Right : Upper extremity   5/5    Left:     Upper extremity   5/5  Lower extremity   5/5     Lower extremity   5/5 Tone and bulk:normal tone throughout; no atrophy noted Sensory: Pinprick and light touch intact throughout, bilaterally Deep Tendon Reflexes:  Right: Upper Extremity   Left: Upper extremity   biceps (C-5 to C-6) 2/4   biceps (C-5 to C-6) 2/4 tricep (C7) 2/4    triceps (C7) 2/4 Brachioradialis (C6) 2/4  Brachioradialis (C6) 2/4  Lower Extremity Lower Extremity  quadriceps (L-2 to L-4) 2/4   quadriceps (L-2 to L-4) 2/4 Achilles (S1) 2/4   Achilles (S1) 2/4  Plantars: Right: downgoing   Left: downgoing Cerebellar: normal finger-to-nose,  normal heel-to-shin test Gait:  No tested    Results for orders placed during the hospital encounter of 01/26/14 (from the past 48 hour(s))  CBC     Status:  Abnormal   Collection Time    01/26/14  4:13 PM      Result Value Ref Range   WBC 3.9 (*) 4.0 - 10.5 K/uL   RBC 3.08 (*) 3.87 - 5.11 MIL/uL   Hemoglobin 9.8 (*) 12.0 - 15.0 g/dL   HCT 30.2 (*) 36.0 - 46.0 %  MCV 98.1  78.0 - 100.0 fL   MCH 31.8  26.0 - 34.0 pg   MCHC 32.5  30.0 - 36.0 g/dL   RDW 17.2 (*) 11.5 - 15.5 %   Platelets 288  150 - 400 K/uL  COMPREHENSIVE METABOLIC PANEL     Status: Abnormal   Collection Time    01/26/14  4:13 PM      Result Value Ref Range   Sodium 138  137 - 147 mEq/L   Potassium 4.0  3.7 - 5.3 mEq/L   Chloride 102  96 - 112 mEq/L   CO2 25  19 - 32 mEq/L   Glucose, Bld 85  70 - 99 mg/dL   BUN 12  6 - 23 mg/dL   Creatinine, Ser 0.74  0.50 - 1.10 mg/dL   Calcium 9.2  8.4 - 10.5 mg/dL   Total Protein 6.6  6.0 - 8.3 g/dL   Albumin 3.3 (*) 3.5 - 5.2 g/dL   AST 12  0 - 37 U/L   ALT 16  0 - 35 U/L   Alkaline Phosphatase 79  39 - 117 U/L   Total Bilirubin 1.2  0.3 - 1.2 mg/dL   GFR calc non Af Amer 89 (*) >90 mL/min   GFR calc Af Amer >90  >90 mL/min   Comment: (NOTE)     The eGFR has been calculated using the CKD EPI equation.     This calculation has not been validated in all clinical situations.     eGFR's persistently <90 mL/min signify possible Chronic Kidney     Disease.   Anion gap 11  5 - 15  I-STAT TROPOININ, ED     Status: None   Collection Time    01/26/14  4:25 PM      Result Value Ref Range   Troponin i, poc 0.00  0.00 - 0.08 ng/mL   Comment 3            Comment: Due to the release kinetics of cTnI,     a negative result within the first hours     of the onset of symptoms does not rule out     myocardial infarction with certainty.     If myocardial infarction is still suspected,     repeat the test at appropriate intervals.  URINE RAPID DRUG SCREEN (HOSP PERFORMED)     Status: Abnormal   Collection Time    01/26/14  6:21 PM      Result Value Ref Range   Opiates POSITIVE (*) NONE DETECTED   Cocaine NONE DETECTED  NONE DETECTED    Benzodiazepines NONE DETECTED  NONE DETECTED   Amphetamines NONE DETECTED  NONE DETECTED   Tetrahydrocannabinol NONE DETECTED  NONE DETECTED   Barbiturates NONE DETECTED  NONE DETECTED   Comment:            DRUG SCREEN FOR MEDICAL PURPOSES     ONLY.  IF CONFIRMATION IS NEEDED     FOR ANY PURPOSE, NOTIFY LAB     WITHIN 5 DAYS.                LOWEST DETECTABLE LIMITS     FOR URINE DRUG SCREEN     Drug Class       Cutoff (ng/mL)     Amphetamine      1000     Barbiturate      200     Benzodiazepine   559     Tricyclics  300     Opiates          300     Cocaine          300     THC              50  URINALYSIS, ROUTINE W REFLEX MICROSCOPIC     Status: Abnormal   Collection Time    01/26/14  6:21 PM      Result Value Ref Range   Color, Urine YELLOW  YELLOW   APPearance CLOUDY (*) CLEAR   Specific Gravity, Urine 1.034 (*) 1.005 - 1.030   pH 7.5  5.0 - 8.0   Glucose, UA NEGATIVE  NEGATIVE mg/dL   Hgb urine dipstick NEGATIVE  NEGATIVE   Bilirubin Urine NEGATIVE  NEGATIVE   Ketones, ur NEGATIVE  NEGATIVE mg/dL   Protein, ur NEGATIVE  NEGATIVE mg/dL   Urobilinogen, UA 0.2  0.0 - 1.0 mg/dL   Nitrite NEGATIVE  NEGATIVE   Leukocytes, UA NEGATIVE  NEGATIVE   Comment: MICROSCOPIC NOT DONE ON URINES WITH NEGATIVE PROTEIN, BLOOD, LEUKOCYTES, NITRITE, OR GLUCOSE <1000 mg/dL.  CBC     Status: Abnormal   Collection Time    01/26/14 11:30 PM      Result Value Ref Range   WBC 3.5 (*) 4.0 - 10.5 K/uL   RBC 2.85 (*) 3.87 - 5.11 MIL/uL   Hemoglobin 9.1 (*) 12.0 - 15.0 g/dL   HCT 27.6 (*) 36.0 - 46.0 %   MCV 96.8  78.0 - 100.0 fL   MCH 31.9  26.0 - 34.0 pg   MCHC 33.0  30.0 - 36.0 g/dL   RDW 17.2 (*) 11.5 - 15.5 %   Platelets 252  150 - 400 K/uL  CREATININE, SERUM     Status: None   Collection Time    01/26/14 11:30 PM      Result Value Ref Range   Creatinine, Ser 0.69  0.50 - 1.10 mg/dL   GFR calc non Af Amer >90  >90 mL/min   GFR calc Af Amer >90  >90 mL/min   Comment: (NOTE)      The eGFR has been calculated using the CKD EPI equation.     This calculation has not been validated in all clinical situations.     eGFR's persistently <90 mL/min signify possible Chronic Kidney     Disease.  TSH     Status: None   Collection Time    01/26/14 11:30 PM      Result Value Ref Range   TSH 4.380  0.350 - 4.500 uIU/mL  LIPID PANEL     Status: None   Collection Time    01/26/14 11:30 PM      Result Value Ref Range   Cholesterol 99  0 - 200 mg/dL   Triglycerides 94  <150 mg/dL   HDL 58  >39 mg/dL   Total CHOL/HDL Ratio 1.7     VLDL 19  0 - 40 mg/dL   LDL Cholesterol 22  0 - 99 mg/dL   Comment:            Total Cholesterol/HDL:CHD Risk     Coronary Heart Disease Risk Table                         Men   Women      1/2 Average Risk   3.4   3.3      Average Risk  5.0   4.4      2 X Average Risk   9.6   7.1      3 X Average Risk  23.4   11.0                Use the calculated Patient Ratio     above and the CHD Risk Table     to determine the patient's CHD Risk.                ATP III CLASSIFICATION (LDL):      <100     mg/dL   Optimal      100-129  mg/dL   Near or Above                        Optimal      130-159  mg/dL   Borderline      160-189  mg/dL   High      >190     mg/dL   Very High   Mr Jeri Cos Wo Contrast  01/26/2014   CLINICAL DATA:  Bilateral hand and right leg numbness. Weakness. Headaches and falls. History of multiple myeloma.  EXAM: MRI HEAD WITHOUT AND WITH CONTRAST  MRA HEAD WITHOUT CONTRAST  TECHNIQUE: Multiplanar, multiecho pulse sequences of the brain and surrounding structures were obtained without and with intravenous contrast. Angiographic images of the head were obtained using MRA technique without contrast.  CONTRAST:  51m MULTIHANCE GADOBENATE DIMEGLUMINE 529 MG/ML IV SOLN  COMPARISON:  Head CT 08/09/2013.  Head MRI/ MRA 02/24/2013.  FINDINGS: MRI HEAD FINDINGS  There is no acute infarct, intracranial hemorrhage, midline shift, or  extra-axial fluid collection. Numerous small foci of T2 hyperintensity in the cerebral white matter are similar to the prior MRI and nonspecific but may reflect mild chronic small vessel ischemic disease and/or posttherapy changes. No abnormal brain parenchymal enhancement is seen.  Large lesion involving the right parietal skull with extension to the underlying dura has not significantly changed in size, measuring 3.9 x 1.1 cm. Small lesions in the left parietal bone are also unchanged. Lesion in the C2 vertebral body is also grossly unchanged. Orbits are unremarkable. Small left maxillary sinus mucous retention cyst is noted. Mastoid air cells are clear. Major intracranial vascular flow voids are preserved.  MRA HEAD FINDINGS  Visualized distal vertebral arteries are patent and codominant. Right PICA origin is patent. Left PICA is not clearly identified. Bilateral AICA and SCA origins are patent. Basilar artery is patent without stenosis. PCAs are unremarkable aside from mild right greater than left branch vessel irregularity, which may reflect mild atherosclerosis that is accentuated by mild motion on this study. Internal carotid arteries are patent from skullbase to carotid termini. MCAs and ACAs are unremarkable. No intracranial aneurysm is identified.  IMPRESSION: 1. No evidence of acute intracranial abnormality. Mild chronic white matter changes. 2. Unchanged skull lesions, consistent with multiple myeloma, including a large right parietal lesion with involvement of the underlying dura. 3. No evidence of major intracranial arterial occlusion or proximal stenosis.   Electronically Signed   By: ALogan Bores  On: 01/26/2014 17:49   Mr MJodene NamHead/brain Wo Cm  01/26/2014   CLINICAL DATA:  Bilateral hand and right leg numbness. Weakness. Headaches and falls. History of multiple myeloma.  EXAM: MRI HEAD WITHOUT AND WITH CONTRAST  MRA HEAD WITHOUT CONTRAST  TECHNIQUE: Multiplanar, multiecho pulse sequences of the  brain and surrounding structures were  obtained without and with intravenous contrast. Angiographic images of the head were obtained using MRA technique without contrast.  CONTRAST:  56m MULTIHANCE GADOBENATE DIMEGLUMINE 529 MG/ML IV SOLN  COMPARISON:  Head CT 08/09/2013.  Head MRI/ MRA 02/24/2013.  FINDINGS: MRI HEAD FINDINGS  There is no acute infarct, intracranial hemorrhage, midline shift, or extra-axial fluid collection. Numerous small foci of T2 hyperintensity in the cerebral white matter are similar to the prior MRI and nonspecific but may reflect mild chronic small vessel ischemic disease and/or posttherapy changes. No abnormal brain parenchymal enhancement is seen.  Large lesion involving the right parietal skull with extension to the underlying dura has not significantly changed in size, measuring 3.9 x 1.1 cm. Small lesions in the left parietal bone are also unchanged. Lesion in the C2 vertebral body is also grossly unchanged. Orbits are unremarkable. Small left maxillary sinus mucous retention cyst is noted. Mastoid air cells are clear. Major intracranial vascular flow voids are preserved.  MRA HEAD FINDINGS  Visualized distal vertebral arteries are patent and codominant. Right PICA origin is patent. Left PICA is not clearly identified. Bilateral AICA and SCA origins are patent. Basilar artery is patent without stenosis. PCAs are unremarkable aside from mild right greater than left branch vessel irregularity, which may reflect mild atherosclerosis that is accentuated by mild motion on this study. Internal carotid arteries are patent from skullbase to carotid termini. MCAs and ACAs are unremarkable. No intracranial aneurysm is identified.  IMPRESSION: 1. No evidence of acute intracranial abnormality. Mild chronic white matter changes. 2. Unchanged skull lesions, consistent with multiple myeloma, including a large right parietal lesion with involvement of the underlying dura. 3. No evidence of major  intracranial arterial occlusion or proximal stenosis.   Electronically Signed   By: ALogan Bores  On: 01/26/2014 17:49     Assessment: 63y.o. female with MM s/p BM transplant 2011[facility] left arm-tongue-neck paresthesias (remarkable better but still present) and right leg weakness that resolved. Neuro-exam non focal and MRI brain/MRA without acute or chronic abnormality that could explain her symptoms. Her left arm-tongue paresthesias are not completely gone and thus unsure this is a TIA. No findings on exam to suggest MM with cord involvement. Can not explain tongue paresthesias with a peripheral process. May consider imaging cervical cord but I am unsure her syndrome will be explained by a lesion at that level. Will follow up.  ODorian Pod MD [facility] [phone] 01/27/2014, 12:46 AM

## 2014-01-27 NOTE — Progress Notes (Addendum)
[facility] Progress Note   ALAZNE QUANT YSH:[id number] DOB: [birth year] DOA: 01/26/2014 PCP: [name], MD  Brief narrative: [name] is a 63 y.o. female  with multiple myeloma currently being treated with chemotherapy presents with numbness of left arm and neck tingling of her tongue and transient painful weakness of her right leg. She has chronic hand and feet numbness.   Subjective: Left arm still feels numb- tingling of her tongue has resolved - no further right leg weakness.  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 treatment in the past which was cut short because of the neuropathy  Active Problems:   MULTIPLE  MYELOMA -Currently on chemotherapy holiday  Hypothyroid -Continue levothyroxin   Code Status: Full code Family Communication: None Disposition Plan: Home tomorrow most likely  Consultants: Urology  Procedures: None  Antibiotics: Anti-infectives   None       DVT prophylaxis: Lovenox  Objective: Filed Weights   01/26/14 2100  Weight: 87.816 kg (193 lb 9.6 oz)    Intake/Output Summary (Last 24 hours) at 01/27/14 1515 Last data filed at 01/27/14 0754  Gross per 24 hour  Intake     30 ml  Output      0 ml  Net     30 ml     Vitals Filed Vitals:   01/27/14 0312 01/27/14 0530 01/27/14 1057 01/27/14 1421  BP: 102/45 113/65 125/69 110/61  Pulse: 66 59 65 71  Temp: 98.4 F (36.9 C) 99 F (37.2 C) 98 F (36.7 C) 98.1 F (36.7 C)  TempSrc: Oral Oral Oral Oral  Resp: _0 Height:      Weight:      SpO2: 99% 99% 100% 100%    Exam: General: No acute respiratory distress Lungs: Clear to auscultation bilaterally without wheezes or crackles Cardiovascular: Regular rate and rhythm without murmur  gallop or rub normal S1 and S2 Abdomen: Nontender, nondistended, soft, bowel sounds positive, no rebound, no ascites, no appreciable mass Extremities: No significant cyanosis, clubbing, or edema bilateral lower extremities Neuro: CN 2-12 intact, sensation intact b/l to light touch, strength intact in all 4 extremities  Data Reviewed: Basic Metabolic Panel:  Recent Labs Lab 01/26/14 1613 01/26/14 2330 01/27/14 0500  NA 138  --  141  K 4.0  --  4.1  CL 102  --  106  CO2 25  --  25  GLUCOSE 85  --  104*  BUN 12  --  10  CREATININE 0.74 0.69 0.75  CALCIUM 9.2  --  8.7  MG  --   --  1.9   Liver Function Tests:  Recent Labs Lab 01/26/14 1613  AST 12  ALT 16  ALKPHOS 79  BILITOT 1.2  PROT 6.6  ALBUMIN 3.3*   No results found for this basename: LIPASE, AMYLASE,  in the last 168 hours No results found for this basename: AMMONIA,  in the last 168 hours CBC:  Recent Labs Lab 01/26/14 1613 01/26/14 2330 01/27/14 0500  WBC 3.9* 3.5* 3.4*  HGB 9.8* 9.1* 9.1*  HCT 30.2* 27.6* 28.2*  MCV 98.1 96.8 97.2  PLT 288 252 278   Cardiac Enzymes: No results found for this basename: CKTOTAL, CKMB, CKMBINDEX, TROPONINI,  in the last 168 hours BNP (last 3  results)  Recent Labs  05/01/13 1214 08/30/13 1815  PROBNP 130.0* 12.6   CBG: No results found for this basename: GLUCAP,  in the last 168 hours  No results found for this or any previous visit (from the past 240 hour(s)).   Studies:  Recent x-ray studies have been reviewed in detail by the Attending Physician  Scheduled Meds:  Scheduled Meds: . atorvastatin  40 mg Oral q1800  . enoxaparin (LOVENOX) injection  40 mg Subcutaneous Q24H  . furosemide  40 mg Oral BID  . levothyroxine  100 mcg Oral Once per day on Sun Tue Thu Sat  . [START ON 01/28/2014] levothyroxine  50 mcg Oral Once per day on Mon Wed Fri  . magnesium gluconate  500 mg Oral q morning - 10a  . morphine  15 mg Oral Q12H  . multivitamin with minerals  1  tablet Oral q morning - 10a  . pantoprazole  40 mg Oral Daily  . potassium chloride  10 mEq Oral BID  . sodium chloride  3 mL Intravenous Q12H  . sodium chloride  3 mL Intravenous Q12H   Continuous Infusions:   Time spent on care of this patient: 25 min   Etna Green, MD 01/27/2014, 3:15 PM  LOS: 1 day   [facility] Office  [phone] Pager - Text Page per [email or web address]  If 7PM-7AM, please contact night-coverage [email or web address]

## 2014-01-27 NOTE — Telephone Encounter (Signed)
Was at [facility] yesterday and pt experienced numbness and tingling in left hand and ram . She was admitted to [facility] to evaluate symptoms of ?mini stroke.

## 2014-01-27 NOTE — Progress Notes (Signed)
Report received from Talbert Cage RN from [place] at [phone]. Pt arrived via [facility] [id number]). Pt ambulated to bed, stand-by assist. Alert and oriented to room, call bell in place, bed alarm on. Will continue to monitor.

## 2014-01-27 NOTE — Progress Notes (Signed)
*  PRELIMINARY RESULTS* Vascular Ultrasound Carotid Duplex (Doppler) has been completed.  Findings suggest 1-39% internal carotid artery stenosis bilaterally. Vertebral arteries are patent with antegrade flow.  01/27/2014 4:16 PM Maudry Mayhew, RVT, RDCS, RDMS

## 2014-01-28 ENCOUNTER — Inpatient Hospital Stay (HOSPITAL_COMMUNITY): Payer: Medicare Other

## 2014-01-28 LAB — GLUCOSE, CAPILLARY: GLUCOSE-CAPILLARY: 124 mg/dL — AB (ref 70–99)

## 2014-01-28 NOTE — Progress Notes (Signed)
Physical Therapy Treatment Patient Details Name: [name] MRN: [id number] DOB: [birth year] Today's Date: 01/28/2014    History of Present Illness Adm 01/26/14 with RLE, LUE, tongue parasthesias. Pt currently undergoing chemotherapy for multiple myeloma and has had bil hand and feet parasthesias, however this has now progressed into lower legs and Lt arm and tongue. Additional PMHx- pathological compression fracture    PT Comments    Pt slightly more unsteady today with drifting to her right and Lt knee buckling x 1. Reviewed standing exercises she can continue/resume on return home with HHPT to then further advance program as indicated. Lengthy discussion re: advantage of cane vs RW over using furniture/walls for UE support and sensory feedback. Pt agrees to use her RW at home.   Follow Up Recommendations  Home health PT;Supervision - Intermittent     Equipment Recommendations  None recommended by PT    Recommendations for Other Services       Precautions / Restrictions Precautions Precautions: Fall Restrictions Weight Bearing Restrictions: No    Mobility  Bed Mobility                  Transfers Overall transfer level: Needs assistance Equipment used: None Transfers: Sit to/from Stand Sit to Stand: Supervision         General transfer comment: pt with posterior sway and step to recover balance x1; transfer x 4  Ambulation/Gait Ambulation/Gait assistance: Min assist Ambulation Distance (Feet): 350 Feet Assistive device: None Gait Pattern/deviations: Step-through pattern;Decreased stride length;Drifts right/left   Gait velocity interpretation: at or above normal speed for age/gender General Gait Details: drifting to Rt today; Lt knee buckled x 1 with need for min assist to recover balance (after walking 250 ft); discussed numbness in feet worse than numbness in hands and use of cane or RW will give her sensory feedback through hands to improve her  balance   Stairs Stairs: Yes Stairs assistance: Min guard Stair Management: No rails;One rail Right;Forwards;Step to pattern Number of Stairs: 1 (x 20)    Wheelchair Mobility    Modified Rankin (Stroke Patients Only) Modified Rankin (Stroke Patients Only) Pre-Morbid Rankin Score: No significant disability Modified Rankin: Moderately severe disability     Balance Overall balance assessment: Needs assistance                           High level balance activites: Side stepping;Backward walking;Direction changes;Turns;Sudden stops;Head turns High Level Balance Comments: drifting Rt with head turns; posterior sway and step with quick stop x 2; loss of balance x1 with backwards walking    Cognition Arousal/Alertness: Awake/alert Behavior During Therapy: WFL for tasks assessed/performed Overall Cognitive Status: Within Functional Limits for tasks assessed                      Exercises      General Comments        Pertinent Vitals/Pain no apparent distress     Home Living                      Prior Function            PT Goals (current goals can now be found in the care plan section) Acute Rehab PT Goals Patient Stated Goal: ambulate without a devicer Progress towards PT goals: Progressing toward goals    Frequency  Min 3X/week    PT Plan Current plan remains appropriate  Co-evaluation             End of Session Equipment Utilized During Treatment: Gait belt Activity Tolerance: Patient tolerated treatment well Patient left: with family/visitor present;with call bell/phone within reach;in chair     Time: 7493-5521 PT Time Calculation (min): 23 min  Charges:  $Gait Training: 23-37 mins                    G Codes:      [name] 02-26-2014, 11:01 AM Pager [phone]

## 2014-01-28 NOTE — Progress Notes (Signed)
[facility] Progress Note   [name] HTD:[id number] DOB: 09-01-1950 DOA: 01/26/2014 PCP: [name], MD  Brief narrative: [name] is a 63 y.o. female  with multiple myeloma currently being treated with chemotherapy presents with numbness of left arm and neck tingling of her tongue and transient painful weakness of her right leg. She has chronic hand and feet numbness.   Subjective: Left arm still feels numb- tingling of her tongue has resolved - no further right leg weakness.  Assessment/Plan: Principal Problem:   Numbness and tingling - an MRI/MRA are negative -Neurology has evaluated the patient and plans a C spine MRI- also recommended LP but pt declined this - pt would like to stay in the hospital until MRI has been completed.  -She has peripheral neuropathy with numbness and tingling of hands and feet from Velcade treatment in the past which was cut short because of the neuropathy  Active Problems:   MULTIPLE  MYELOMA -Currently on chemotherapy holiday  Hypothyroid -Continue levothyroxine   Code Status: Full code Family Communication: None Disposition Plan: Home tomorrow most likely  Consultants: Urology  Procedures: None  Antibiotics: Anti-infectives   None       DVT prophylaxis: Lovenox  Objective: Filed Weights   01/26/14 2100  Weight: 87.816 kg (193 lb 9.6 oz)   No intake or output data in the 24 hours ending 01/28/14 1501   Vitals Filed Vitals:   01/28/14 0400 01/28/14 0647 01/28/14 1028 01/28/14 1411  BP:  101/59 110/63 125/66  Pulse:  59 78 71  Temp:  97.2 F (36.2 C) 97.9 F (36.6 C) 98.1 F (36.7 C)  TempSrc:  Oral Oral Oral  Resp: _0 Height:      Weight:      SpO2:  99% 100% 100%    Exam: General: No acute respiratory distress Lungs: Clear to auscultation bilaterally without wheezes or crackles Cardiovascular: Regular rate and rhythm without murmur gallop or rub normal S1 and  S2 Abdomen: Nontender, nondistended, soft, bowel sounds positive, no rebound, no ascites, no appreciable mass Extremities: No significant cyanosis, clubbing, or edema bilateral lower extremities Neuro: CN 2-12 intact, sensation intact b/l to light touch, strength intact in all 4 extremities  Data Reviewed: Basic Metabolic Panel:  Recent Labs Lab 01/26/14 1613 01/26/14 2330 01/27/14 0500  NA 138  --  141  K 4.0  --  4.1  CL 102  --  106  CO2 25  --  25  GLUCOSE 85  --  104*  BUN 12  --  10  CREATININE 0.74 0.69 0.75  CALCIUM 9.2  --  8.7  MG  --   --  1.9   Liver Function Tests:  Recent Labs Lab 01/26/14 1613  AST 12  ALT 16  ALKPHOS 79  BILITOT 1.2  PROT 6.6  ALBUMIN 3.3*   No results found for this basename: LIPASE, AMYLASE,  in the last 168 hours No results found for this basename: AMMONIA,  in the last 168 hours CBC:  Recent Labs Lab 01/26/14 1613 01/26/14 2330 01/27/14 0500  WBC 3.9* 3.5* 3.4*  HGB 9.8* 9.1* 9.1*  HCT 30.2* 27.6* 28.2*  MCV 98.1 96.8 97.2  PLT 288 252 278   Cardiac Enzymes: No results found for this basename: CKTOTAL, CKMB, CKMBINDEX, TROPONINI,  in the last 168 hours BNP (last 3 results)  Recent Labs  05/01/13 1214 08/30/13 1815  PROBNP 130.0* 12.6   CBG: No results found  for this basename: GLUCAP,  in the last 168 hours  No results found for this or any previous visit (from the past 240 hour(s)).   Studies:  Recent x-ray studies have been reviewed in detail by the Attending Physician  Scheduled Meds:  Scheduled Meds: . atorvastatin  40 mg Oral q1800  . enoxaparin (LOVENOX) injection  40 mg Subcutaneous Q24H  . furosemide  40 mg Oral BID  . levothyroxine  100 mcg Oral Once per day on Sun Tue Thu Sat  . levothyroxine  50 mcg Oral Once per day on Mon Wed Fri  . magnesium gluconate  500 mg Oral q morning - 10a  . morphine  15 mg Oral Q12H  . multivitamin with minerals  1 tablet Oral q morning - 10a  . pantoprazole  40  mg Oral Daily  . potassium chloride  10 mEq Oral BID  . sodium chloride  3 mL Intravenous Q12H  . sodium chloride  3 mL Intravenous Q12H   Continuous Infusions:   Time spent on care of this patient: 25 min   [place], MD 01/28/2014, 3:01 PM  LOS: 2 days   [facility] Office  [phone] Pager - Text Page per [email or web address]  If 7PM-7AM, please contact night-coverage [email or web address]

## 2014-01-28 NOTE — Progress Notes (Signed)
Talked to patient about DCP/ [place] choices; patient request [facility] for HHPT; also patient request rollator ( rolling walker with seat) and a cane at discharge; Attending MD, if in agreement, please order [place], rollator and cane; B Pennie Rushing [phone]

## 2014-01-28 NOTE — Progress Notes (Signed)
Subjective: Patient reports that she continues to be numb on the left.  No longer has any tongue or facial complaints. She reports that they have resolved.    Objective: Current vital signs: BP 101/59  Pulse 59  Temp(Src) 97.2 F (36.2 C) (Oral)  Resp 18  Ht 5' 4"  (1.626 m)  Wt 87.816 kg (193 lb 9.6 oz)  BMI 33.21 kg/m2  SpO2 99% Vital signs in last 24 hours: Temp:  [97.2 F (36.2 C)-98.7 F (37.1 C)] 97.2 F (36.2 C) (07/31 0647) Pulse Rate:  [59-71] 59 (07/31 0647) Resp:  [16-20] 18 (07/31 0647) BP: (101-125)/(54-69) 101/59 mmHg (07/31 0647) SpO2:  [99 %-100 %] 99 % (07/31 0647)  Intake/Output from previous day: 07/30 0701 - 07/31 0700 In: 30 [I.V.:30] Out: -  Intake/Output this shift:   Nutritional status: Carb Control  Neurologic Exam: Mental Status:  Alert, oriented, thought content appropriate. Speech fluent without evidence of aphasia. Able to follow 3 step commands without difficulty.  Cranial Nerves:  II: Discs flat bilaterally; Visual fields grossly normal, pupils equal, round, reactive to light and accommodation  III,IV, VI: ptosis not present, extra-ocular motions intact bilaterally  V,VII: smile symmetric, facial light touch sensation normal bilaterally  VIII: hearing normal bilaterally  IX,X: gag reflex present  XI: bilateral shoulder shrug  XII: midline tongue extension without atrophy or fasciculations  Motor:  5/5 throughout Sensory: Pinprick and light touch in the left arm and leg Deep Tendon Reflexes:  2+ throughout Plantars:  Right: downgoing   Left: downgoing  Cerebellar:  normal finger-to-nose, normal heel-to-shin test    Lab Results: Basic Metabolic Panel:  Recent Labs Lab 01/26/14 1613 01/26/14 2330 01/27/14 0500  NA 138  --  141  K 4.0  --  4.1  CL 102  --  106  CO2 25  --  25  GLUCOSE 85  --  104*  BUN 12  --  10  CREATININE 0.74 0.69 0.75  CALCIUM 9.2  --  8.7  MG  --   --  1.9    Liver Function Tests:  Recent  Labs Lab 01/26/14 1613  AST 12  ALT 16  ALKPHOS 79  BILITOT 1.2  PROT 6.6  ALBUMIN 3.3*   No results found for this basename: LIPASE, AMYLASE,  in the last 168 hours No results found for this basename: AMMONIA,  in the last 168 hours  CBC:  Recent Labs Lab 01/26/14 1613 01/26/14 2330 01/27/14 0500  WBC 3.9* 3.5* 3.4*  HGB 9.8* 9.1* 9.1*  HCT 30.2* 27.6* 28.2*  MCV 98.1 96.8 97.2  PLT 288 252 278    Cardiac Enzymes: No results found for this basename: CKTOTAL, CKMB, CKMBINDEX, TROPONINI,  in the last 168 hours  Lipid Panel:  Recent Labs Lab 01/26/14 2330  CHOL 99  TRIG 94  HDL 58  CHOLHDL 1.7  VLDL 19  LDLCALC 22    CBG: No results found for this basename: GLUCAP,  in the last 168 hours  Microbiology: Results for orders placed in visit on 12/23/13  TECHNOLOGIST REVIEW     Status: None   Collection Time    12/23/13 11:25 AM      Result Value Ref Range Status   Technologist Review Few plasmacytoid lymphs   Final    Coagulation Studies: No results found for this basename: LABPROT, INR,  in the last 72 hours  Imaging: Mr Jeri Cos Wo Contrast  01/26/2014   CLINICAL DATA:  Bilateral hand and right  leg numbness. Weakness. Headaches and falls. History of multiple myeloma.  EXAM: MRI HEAD WITHOUT AND WITH CONTRAST  MRA HEAD WITHOUT CONTRAST  TECHNIQUE: Multiplanar, multiecho pulse sequences of the brain and surrounding structures were obtained without and with intravenous contrast. Angiographic images of the head were obtained using MRA technique without contrast.  CONTRAST:  79m MULTIHANCE GADOBENATE DIMEGLUMINE 529 MG/ML IV SOLN  COMPARISON:  Head CT 08/09/2013.  Head MRI/ MRA 02/24/2013.  FINDINGS: MRI HEAD FINDINGS  There is no acute infarct, intracranial hemorrhage, midline shift, or extra-axial fluid collection. Numerous small foci of T2 hyperintensity in the cerebral white matter are similar to the prior MRI and nonspecific but may reflect mild chronic small  vessel ischemic disease and/or posttherapy changes. No abnormal brain parenchymal enhancement is seen.  Large lesion involving the right parietal skull with extension to the underlying dura has not significantly changed in size, measuring 3.9 x 1.1 cm. Small lesions in the left parietal bone are also unchanged. Lesion in the C2 vertebral body is also grossly unchanged. Orbits are unremarkable. Small left maxillary sinus mucous retention cyst is noted. Mastoid air cells are clear. Major intracranial vascular flow voids are preserved.  MRA HEAD FINDINGS  Visualized distal vertebral arteries are patent and codominant. Right PICA origin is patent. Left PICA is not clearly identified. Bilateral AICA and SCA origins are patent. Basilar artery is patent without stenosis. PCAs are unremarkable aside from mild right greater than left branch vessel irregularity, which may reflect mild atherosclerosis that is accentuated by mild motion on this study. Internal carotid arteries are patent from skullbase to carotid termini. MCAs and ACAs are unremarkable. No intracranial aneurysm is identified.  IMPRESSION: 1. No evidence of acute intracranial abnormality. Mild chronic white matter changes. 2. Unchanged skull lesions, consistent with multiple myeloma, including a large right parietal lesion with involvement of the underlying dura. 3. No evidence of major intracranial arterial occlusion or proximal stenosis.   Electronically Signed   By: ALogan Bores  On: 01/26/2014 17:49   Mr MJodene NamHead/brain Wo Cm  01/26/2014   CLINICAL DATA:  Bilateral hand and right leg numbness. Weakness. Headaches and falls. History of multiple myeloma.  EXAM: MRI HEAD WITHOUT AND WITH CONTRAST  MRA HEAD WITHOUT CONTRAST  TECHNIQUE: Multiplanar, multiecho pulse sequences of the brain and surrounding structures were obtained without and with intravenous contrast. Angiographic images of the head were obtained using MRA technique without contrast.  CONTRAST:   161mMULTIHANCE GADOBENATE DIMEGLUMINE 529 MG/ML IV SOLN  COMPARISON:  Head CT 08/09/2013.  Head MRI/ MRA 02/24/2013.  FINDINGS: MRI HEAD FINDINGS  There is no acute infarct, intracranial hemorrhage, midline shift, or extra-axial fluid collection. Numerous small foci of T2 hyperintensity in the cerebral white matter are similar to the prior MRI and nonspecific but may reflect mild chronic small vessel ischemic disease and/or posttherapy changes. No abnormal brain parenchymal enhancement is seen.  Large lesion involving the right parietal skull with extension to the underlying dura has not significantly changed in size, measuring 3.9 x 1.1 cm. Small lesions in the left parietal bone are also unchanged. Lesion in the C2 vertebral body is also grossly unchanged. Orbits are unremarkable. Small left maxillary sinus mucous retention cyst is noted. Mastoid air cells are clear. Major intracranial vascular flow voids are preserved.  MRA HEAD FINDINGS  Visualized distal vertebral arteries are patent and codominant. Right PICA origin is patent. Left PICA is not clearly identified. Bilateral AICA and SCA origins are patent. Basilar  artery is patent without stenosis. PCAs are unremarkable aside from mild right greater than left branch vessel irregularity, which may reflect mild atherosclerosis that is accentuated by mild motion on this study. Internal carotid arteries are patent from skullbase to carotid termini. MCAs and ACAs are unremarkable. No intracranial aneurysm is identified.  IMPRESSION: 1. No evidence of acute intracranial abnormality. Mild chronic white matter changes. 2. Unchanged skull lesions, consistent with multiple myeloma, including a large right parietal lesion with involvement of the underlying dura. 3. No evidence of major intracranial arterial occlusion or proximal stenosis.   Electronically Signed   By: Logan Bores   On: 01/26/2014 17:49    Medications:  I have reviewed the patient's current  medications. Scheduled: . atorvastatin  40 mg Oral q1800  . enoxaparin (LOVENOX) injection  40 mg Subcutaneous Q24H  . furosemide  40 mg Oral BID  . levothyroxine  100 mcg Oral Once per day on Sun Tue Thu Sat  . levothyroxine  50 mcg Oral Once per day on Mon Wed Fri  . magnesium gluconate  500 mg Oral q morning - 10a  . morphine  15 mg Oral Q12H  . multivitamin with minerals  1 tablet Oral q morning - 10a  . pantoprazole  40 mg Oral Daily  . potassium chloride  10 mEq Oral BID  . sodium chloride  3 mL Intravenous Q12H  . sodium chloride  3 mL Intravenous Q12H    Assessment/Plan: Patient improved but not at baseline.  MRI of the brain reviewed and shows no acute changes.  MRA shows no hemodynamically significant stenosis.   Carotid doppler reveals 1-39% bilateral ICA stenosis.  Echocardiogram shows no cardiac source of emboli.  The cervical cord remains a possible etiology.  With patient's history full work up would include an LP as well.  Patient has declined.   Recommendations: 1.  MRI of the cervical spine.  If unremarkable patient may be discharged for follow up on an outpatient basis where NCV/EMG can be performed.      LOS: 2 days   Alexis Goodell, MD [facility] [phone] 01/28/2014  10:14 AM

## 2014-01-29 MED ORDER — PNEUMOCOCCAL VAC POLYVALENT 25 MCG/0.5ML IJ INJ: 0.5000 mL | INJECTION | INTRAMUSCULAR | Status: DC

## 2014-01-29 MED ORDER — HEPARIN SOD (PORK) LOCK FLUSH 100 UNIT/ML IV SOLN
500.0000 [IU] | INTRAVENOUS | Status: AC | PRN
  Administered 2014-01-29: 500 [IU]

## 2014-01-29 NOTE — Progress Notes (Signed)
Patient ready for discharge home; discharge instructions given and reviewed; patient verbalizes understanding; equipment delivered; ready for discharge home.

## 2014-01-29 NOTE — Discharge Summary (Signed)
Physician Discharge Summary  [name] EXB:[id number] DOB: [birth year] DOA: 01/26/2014  PCP: [name], MD  Admit date: 01/26/2014 Discharge date: 01/29/2014  Time spent: 40 minutes  Recommendations for Outpatient Follow-up:  1. outpt f/u with neurology- I have given her 2 numbers for the 2 local neurology groups   Discharge Diagnoses:  Principal Problem:   Numbness and tingling Active Problems:   MULTIPLE  MYELOMA   Discharge Condition: stable  Diet recommendation: heart healthy  Filed Weights   01/26/14 2100  Weight: 87.816 kg (193 lb 9.6 oz)    History of present illness:  [name] is a 63 y.o. female with multiple myeloma currently being treated with chemotherapy presents with numbness of left arm and neck tingling of her tongue and transient painful weakness of her right leg. She has chronic hand and feet numbness.  Hospital Course:  Principal Problem:  Numbness and tingling  - an MRI/MRA head are negative  - C spine MRI reveals possible nerve root compression at C6-7 on the left which may be causing the patient's symptoms- Dr Doy Mince (neuro) has recommended the patient f/u with outpt Neuro for EMG studies - SEE IMAGING REPORTS BELOW - Dr Doy Mince also recommended an LP but pt declined this  - pt would like to stay in the hospital until MRI has been completed.  -of note, she has peripheral neuropathy with numbness and tingling of hands and feet from Velcade treatment in the past which was cut short because of the neuropathy   Active Problems:  MULTIPLE MYELOMA  -Currently on chemotherapy holiday  - f/u with Dr Julien Nordmann  Hypothyroid  -Continue levothyroxine   Consultations:  Neuro  Discharge Exam: Filed Vitals:   01/29/14 0944  BP: 111/55  Pulse: 72  Temp: 97.9 F (36.6 C)  Resp: 18    General: No acute respiratory distress  Lungs: Clear to auscultation bilaterally without wheezes or crackles  Cardiovascular: Regular rate  and rhythm without murmur gallop or rub normal S1 and S2  Abdomen: Nontender, nondistended, soft, bowel sounds positive, no rebound, no ascites, no appreciable mass  Extremities: No significant cyanosis, clubbing, or edema bilateral lower extremities  Neuro: CN 2-12 intact, sensation intact b/l to light touch, strength intact in all 4 extremities      Discharge Instructions You were cared for by a hospitalist during your hospital stay. If you have any questions about your discharge medications or the care you received while you were in the hospital after you are discharged, you can call the unit and asked to speak with the hospitalist on call if the hospitalist that took care of you is not available. Once you are discharged, your primary care physician will handle any further medical issues. Please note that NO REFILLS for any discharge medications will be authorized once you are discharged, as it is imperative that you return to your primary care physician (or establish a relationship with a primary care physician if you do not have one) for your aftercare needs so that they can reassess your need for medications and monitor your lab values.  Discharge Instructions   Diet - low sodium heart healthy    Complete by:  As directed      Increase activity slowly    Complete by:  As directed             Medication List         ALPRAZolam 0.5 MG tablet  Commonly known as:  XANAX  Take  0.5 mg by mouth 3 (three) times daily as needed for anxiety.     atorvastatin 40 MG tablet  Commonly known as:  LIPITOR  Take 40 mg by mouth every evening.     celecoxib 200 MG capsule  Commonly known as:  CELEBREX  Take 200 mg by mouth daily as needed for mild pain.     dexamethasone 4 MG tablet  Commonly known as:  DECADRON  Take 40 mg by mouth every Friday.     dexlansoprazole 60 MG capsule  Commonly known as:  DEXILANT  Take 60 mg by mouth every morning.     Eszopiclone 3 MG Tabs  Take 3 mg by  mouth at bedtime as needed (sleep). Take immediately before bedtime     furosemide 40 MG tablet  Commonly known as:  LASIX  Take 40 mg by mouth 2 (two) times daily.     gabapentin 600 MG tablet  Commonly known as:  NEURONTIN  Take 600 mg by mouth 4 (four) times daily as needed (pain).     HYDROcodone-acetaminophen 7.5-325 MG per tablet  Commonly known as:  NORCO  Take 1 tablet by mouth every 6 (six) hours as needed for moderate pain.     levothyroxine 100 MCG tablet  Commonly known as:  SYNTHROID, LEVOTHROID  Take 50-100 mcg by mouth daily before breakfast. Take 0.5 tab (59mg) on Monday, Wednesday, and Friday. Take 1 tab (108m) on Sunday, Tuesday, Thursday, and Saturday.     magnesium gluconate 500 MG tablet  Commonly known as:  MAGONATE  Take 500 mg by mouth every morning.     morphine 15 MG 12 hr tablet  Commonly known as:  MS CONTIN  Take 1 tablet (15 mg total) by mouth every 12 (twelve) hours.     multivitamin with minerals Tabs tablet  Take 1 tablet by mouth every morning.     ondansetron 4 MG disintegrating tablet  Commonly known as:  ZOFRAN-ODT  PLACE 1 TABLET ON THE TONGUE EVERY 8 HOURS AS NEEDED FOR NAUSEA     pomalidomide 4 MG capsule  Commonly known as:  POMALYST  Take 1 capsule (4 mg total) by mouth daily. Take with water on days 1-21. Repeat every 28 days.     potassium chloride 10 MEQ tablet  Commonly known as:  K-DUR  Take 10 mEq by mouth 2 (two) times daily.     promethazine-dextromethorphan 6.25-15 MG/5ML syrup  Commonly known as:  PROMETHAZINE-DM  TAKE 5 MLS BY MOUTH 4 (FOUR) TIMES DAILY AS NEEDED FOR COUGH.     sennosides-docusate sodium 8.6-50 MG tablet  Commonly known as:  SENOKOT-S  Take 1 tablet by mouth daily.       No Known Allergies     Follow-up Information   Follow up with [facility].   Contact information:   [address]   [phone]      Follow up with   NEUROLOGY.   Contact information:   [address] [phone]     The results of significant diagnostics from this hospitalization (including imaging, microbiology, ancillary and laboratory) are listed below for reference.    Significant Diagnostic Studies: Mr BrKizzie Fantasiaontrast  01/05/14/2015 CLINICAL DATA:  Bilateral hand and right leg numbness. Weakness. Headaches and falls. History of multiple myeloma.  EXAM: MRI HEAD WITHOUT AND WITH CONTRAST  MRA HEAD WITHOUT CONTRAST  TECHNIQUE: Multiplanar, multiecho pulse sequences of the brain and surrounding structures were obtained without and with intravenous contrast. Angiographic images of the head were obtained using MRA technique without contrast.  CONTRAST:  61m MULTIHANCE GADOBENATE DIMEGLUMINE 529 MG/ML IV SOLN  COMPARISON:  Head CT 08/09/2013.  Head MRI/ MRA 02/24/2013.  FINDINGS: MRI HEAD FINDINGS  There is no acute infarct, intracranial hemorrhage, midline shift, or extra-axial fluid collection. Numerous small foci of T2 hyperintensity in the cerebral white matter are similar to the prior MRI and nonspecific but may reflect mild chronic small vessel ischemic disease and/or posttherapy changes. No abnormal brain parenchymal enhancement is seen.  Large lesion involving the right parietal skull with extension to the underlying dura has not significantly changed in size, measuring 3.9 x 1.1 cm. Small lesions in the left parietal bone are also unchanged. Lesion in the C2 vertebral body is also grossly unchanged. Orbits are unremarkable. Small left maxillary sinus mucous retention cyst is noted. Mastoid air cells are clear. Major intracranial vascular flow voids are preserved.  MRA HEAD FINDINGS  Visualized distal vertebral arteries are patent and codominant. Right PICA origin is patent. Left PICA is not clearly identified. Bilateral AICA and SCA origins are patent. Basilar artery is patent without stenosis. PCAs are  unremarkable aside from mild right greater than left branch vessel irregularity, which may reflect mild atherosclerosis that is accentuated by mild motion on this study. Internal carotid arteries are patent from skullbase to carotid termini. MCAs and ACAs are unremarkable. No intracranial aneurysm is identified.  IMPRESSION: 1. No evidence of acute intracranial abnormality. Mild chronic white matter changes. 2. Unchanged skull lesions, consistent with multiple myeloma, including a large right parietal lesion with involvement of the underlying dura. 3. No evidence of major intracranial arterial occlusion or proximal stenosis.   Electronically Signed   By: ALogan Bores  On: 01/26/2014 17:49   Mr Cervical Spine Wo Contrast  01/28/2014   CLINICAL DATA:  Multiple myeloma.  Left-sided numbness.  EXAM: MRI CERVICAL SPINE WITHOUT CONTRAST  TECHNIQUE: Multiplanar, multisequence MR imaging of the cervical spine was performed. No intravenous contrast was administered.  COMPARISON:  MRI cervical spine without and with contrast 02/24/2013.  FINDINGS: T1 hyperintensity throughout the marrow this compatible with prior radiation. Areas of signal loss, particularly at T2 and more heterogeneously C5, C6, and C7 are stable. The lesion at C2 is stable. No new lesions are evident. Normal signal is present in the cervical and upper thoracic spinal cord to the lowest imaged level, T4. The craniocervical junction is within normal limits. The visualized intracranial contents are normal.  Flow is present in the vertebral arteries bilaterally.  C2-3:  Negative.  C3-4: Mild facet hypertrophy is present. There is no significant stenosis or change.  C4-5: A broad-based disc osteophyte complex effaces the ventral CSF. Left-sided uncovertebral and facet hypertrophy contributes to stable mild left foraminal narrowing.  C5-6: A broad-based disc osteophyte complex effaces the ventral CSF and distorts the left side of cord. Moderate left and mild  right foraminal stenosis is similar to the prior study.  C6-7: A leftward disc osteophyte complex is present. There is distortion of the left side of the cord slightly worse than on the prior study. Moderate left and mild right foraminal stenosis is similar to the prior exam.  C7-T1:  Negative.  T1-2:  Mild disc bulging is present without significant stenosis.  IMPRESSION: 1. Left central and foraminal disease at C5-6 and C6-7 results in moderate left lateral  recess and foraminal stenosis at both levels. There may be some progression at C6-7. There is no significant change at C5-6. 2. Mild right foraminal narrowing at C5-6 and C6-7. 3. Stable mild left foraminal narrowing at C4-5. 4. Mild facet hypertrophy at C3-4 without significant stenosis.   Electronically Signed   By: Lawrence Santiago M.D.   On: 01/28/2014 21:23   Mr Jodene Nam Head/brain Wo Cm  01/26/2014   CLINICAL DATA:  Bilateral hand and right leg numbness. Weakness. Headaches and falls. History of multiple myeloma.  EXAM: MRI HEAD WITHOUT AND WITH CONTRAST  MRA HEAD WITHOUT CONTRAST  TECHNIQUE: Multiplanar, multiecho pulse sequences of the brain and surrounding structures were obtained without and with intravenous contrast. Angiographic images of the head were obtained using MRA technique without contrast.  CONTRAST:  35m MULTIHANCE GADOBENATE DIMEGLUMINE 529 MG/ML IV SOLN  COMPARISON:  Head CT 08/09/2013.  Head MRI/ MRA 02/24/2013.  FINDINGS: MRI HEAD FINDINGS  There is no acute infarct, intracranial hemorrhage, midline shift, or extra-axial fluid collection. Numerous small foci of T2 hyperintensity in the cerebral white matter are similar to the prior MRI and nonspecific but may reflect mild chronic small vessel ischemic disease and/or posttherapy changes. No abnormal brain parenchymal enhancement is seen.  Large lesion involving the right parietal skull with extension to the underlying dura has not significantly changed in size, measuring 3.9 x 1.1 cm.  Small lesions in the left parietal bone are also unchanged. Lesion in the C2 vertebral body is also grossly unchanged. Orbits are unremarkable. Small left maxillary sinus mucous retention cyst is noted. Mastoid air cells are clear. Major intracranial vascular flow voids are preserved.  MRA HEAD FINDINGS  Visualized distal vertebral arteries are patent and codominant. Right PICA origin is patent. Left PICA is not clearly identified. Bilateral AICA and SCA origins are patent. Basilar artery is patent without stenosis. PCAs are unremarkable aside from mild right greater than left branch vessel irregularity, which may reflect mild atherosclerosis that is accentuated by mild motion on this study. Internal carotid arteries are patent from skullbase to carotid termini. MCAs and ACAs are unremarkable. No intracranial aneurysm is identified.  IMPRESSION: 1. No evidence of acute intracranial abnormality. Mild chronic white matter changes. 2. Unchanged skull lesions, consistent with multiple myeloma, including a large right parietal lesion with involvement of the underlying dura. 3. No evidence of major intracranial arterial occlusion or proximal stenosis.   Electronically Signed   By: ALogan Bores  On: 01/26/2014 17:49    Microbiology: No results found for this or any previous visit (from the past 240 hour(s)).   Labs: Basic Metabolic Panel:  Recent Labs Lab 01/26/14 1613 01/26/14 2330 01/27/14 0500  NA 138  --  141  K 4.0  --  4.1  CL 102  --  106  CO2 25  --  25  GLUCOSE 85  --  104*  BUN 12  --  10  CREATININE 0.74 0.69 0.75  CALCIUM 9.2  --  8.7  MG  --   --  1.9   Liver Function Tests:  Recent Labs Lab 01/26/14 1613  AST 12  ALT 16  ALKPHOS 79  BILITOT 1.2  PROT 6.6  ALBUMIN 3.3*   No results found for this basename: LIPASE, AMYLASE,  in the last 168 hours No results found for this basename: AMMONIA,  in the last 168 hours CBC:  Recent Labs Lab 01/26/14 1613 01/26/14 2330  01/27/14 0500  WBC 3.9* 3.5* 3.4*  HGB 9.8* 9.1* 9.1*  HCT 30.2* 27.6* 28.2*  MCV 98.1 96.8 97.2  PLT 288 252 278   Cardiac Enzymes: No results found for this basename: CKTOTAL, CKMB, CKMBINDEX, TROPONINI,  in the last 168 hours BNP: BNP (last 3 results)  Recent Labs  05/01/13 1214 08/30/13 1815  PROBNP 130.0* 12.6   CBG:  Recent Labs Lab 01/28/14 1720  GLUCAP 124*       SignedDebbe Odea, MD [facility] 01/29/2014, 10:39 AM

## 2014-01-29 NOTE — Progress Notes (Signed)
Subjective: Patient stable.  No new complaints.    Objective: Current vital signs: BP 104/56  Pulse 62  Temp(Src) 98.4 F (36.9 C) (Oral)  Resp 18  Ht 5' 4"  (1.626 m)  Wt 87.816 kg (193 lb 9.6 oz)  BMI 33.21 kg/m2  SpO2 96% Vital signs in last 24 hours: Temp:  [97.8 F (36.6 C)-98.7 F (37.1 C)] 98.4 F (36.9 C) (08/01 0628) Pulse Rate:  [56-78] 62 (08/01 0628) Resp:  [18-20] 18 (08/01 0628) BP: (104-125)/(53-66) 104/56 mmHg (08/01 0628) SpO2:  [96 %-100 %] 96 % (08/01 0628)  Intake/Output from previous day: 07/31 0701 - 08/01 0700 In: 120 [P.O.:120] Out: -  Intake/Output this shift:   Nutritional status: Carb Control  Neurologic Exam: Mental Status:  Alert, oriented, thought content appropriate. Speech fluent without evidence of aphasia. Able to follow 3 step commands without difficulty.  Cranial Nerves:  II: Discs flat bilaterally; Visual fields grossly normal, pupils equal, round, reactive to light and accommodation  III,IV, VI: ptosis not present, extra-ocular motions intact bilaterally  V,VII: smile symmetric, facial light touch sensation normal bilaterally  VIII: hearing normal bilaterally  IX,X: gag reflex present  XI: bilateral shoulder shrug  XII: midline tongue extension without atrophy or fasciculations  Motor:  5/5 throughout  Sensory: Pinprick and light touch in the left arm and leg  Deep Tendon Reflexes:  2+ throughout  Plantars:  Right: downgoing   Left: downgoing  Cerebellar:  normal finger-to-nose, normal heel-to-shin test      Lab Results: Basic Metabolic Panel:  Recent Labs Lab 01/26/14 1613 01/26/14 2330 01/27/14 0500  NA 138  --  141  K 4.0  --  4.1  CL 102  --  106  CO2 25  --  25  GLUCOSE 85  --  104*  BUN 12  --  10  CREATININE 0.74 0.69 0.75  CALCIUM 9.2  --  8.7  MG  --   --  1.9    Liver Function Tests:  Recent Labs Lab 01/26/14 1613  AST 12  ALT 16  ALKPHOS 79  BILITOT 1.2  PROT 6.6  ALBUMIN 3.3*   No  results found for this basename: LIPASE, AMYLASE,  in the last 168 hours No results found for this basename: AMMONIA,  in the last 168 hours  CBC:  Recent Labs Lab 01/26/14 1613 01/26/14 2330 01/27/14 0500  WBC 3.9* 3.5* 3.4*  HGB 9.8* 9.1* 9.1*  HCT 30.2* 27.6* 28.2*  MCV 98.1 96.8 97.2  PLT 288 252 278    Cardiac Enzymes: No results found for this basename: CKTOTAL, CKMB, CKMBINDEX, TROPONINI,  in the last 168 hours  Lipid Panel:  Recent Labs Lab 01/26/14 2330  CHOL 99  TRIG 94  HDL 58  CHOLHDL 1.7  VLDL 19  LDLCALC 22    CBG:  Recent Labs Lab 01/28/14 1720  GLUCAP 124*    Microbiology: Results for orders placed in visit on 12/23/13  TECHNOLOGIST REVIEW     Status: None   Collection Time    12/23/13 11:25 AM      Result Value Ref Range Status   Technologist Review Few plasmacytoid lymphs   Final    Coagulation Studies: No results found for this basename: LABPROT, INR,  in the last 72 hours  Imaging: Mr Cervical Spine Wo Contrast  01/28/2014   CLINICAL DATA:  Multiple myeloma.  Left-sided numbness.  EXAM: MRI CERVICAL SPINE WITHOUT CONTRAST  TECHNIQUE: Multiplanar, multisequence MR imaging of the cervical  spine was performed. No intravenous contrast was administered.  COMPARISON:  MRI cervical spine without and with contrast 02/24/2013.  FINDINGS: T1 hyperintensity throughout the marrow this compatible with prior radiation. Areas of signal loss, particularly at T2 and more heterogeneously C5, C6, and C7 are stable. The lesion at C2 is stable. No new lesions are evident. Normal signal is present in the cervical and upper thoracic spinal cord to the lowest imaged level, T4. The craniocervical junction is within normal limits. The visualized intracranial contents are normal.  Flow is present in the vertebral arteries bilaterally.  C2-3:  Negative.  C3-4: Mild facet hypertrophy is present. There is no significant stenosis or change.  C4-5: A broad-based disc  osteophyte complex effaces the ventral CSF. Left-sided uncovertebral and facet hypertrophy contributes to stable mild left foraminal narrowing.  C5-6: A broad-based disc osteophyte complex effaces the ventral CSF and distorts the left side of cord. Moderate left and mild right foraminal stenosis is similar to the prior study.  C6-7: A leftward disc osteophyte complex is present. There is distortion of the left side of the cord slightly worse than on the prior study. Moderate left and mild right foraminal stenosis is similar to the prior exam.  C7-T1:  Negative.  T1-2:  Mild disc bulging is present without significant stenosis.  IMPRESSION: 1. Left central and foraminal disease at C5-6 and C6-7 results in moderate left lateral recess and foraminal stenosis at both levels. There may be some progression at C6-7. There is no significant change at C5-6. 2. Mild right foraminal narrowing at C5-6 and C6-7. 3. Stable mild left foraminal narrowing at C4-5. 4. Mild facet hypertrophy at C3-4 without significant stenosis.   Electronically Signed   By: Lawrence Santiago M.D.   On: 01/28/2014 21:23    Medications:  I have reviewed the patient's current medications. Scheduled: . atorvastatin  40 mg Oral q1800  . enoxaparin (LOVENOX) injection  40 mg Subcutaneous Q24H  . furosemide  40 mg Oral BID  . levothyroxine  100 mcg Oral Once per day on Sun Tue Thu Sat  . levothyroxine  50 mcg Oral Once per day on Mon Wed Fri  . magnesium gluconate  500 mg Oral q morning - 10a  . morphine  15 mg Oral Q12H  . multivitamin with minerals  1 tablet Oral q morning - 10a  . pantoprazole  40 mg Oral Daily  . potassium chloride  10 mEq Oral BID  . sodium chloride  3 mL Intravenous Q12H  . sodium chloride  3 mL Intravenous Q12H    Assessment/Plan: Patient stable. MRI of the cervical spine performed and shows disc osteophyte complexes at C4-7, most predominant leftward and at C6-7.  This may be contributing to the patient's  complaints of numbness and may be worsening her underlying symptoms from her peripheral neuropathy.    Recommendations: 1.  Patient to follow up with neurology on an outpatient basis for NCV/EMG.  Will determine need for further intervention based on these results.   2.  No further inpatient neurologic intervention is recommended at this time.  If further questions arise, please call or page at that time.  Thank you for allowing neurology to participate in the care of this patient.    LOS: 3 days   Alexis Goodell, MD [facility] [phone] 01/29/2014  9:43 AM

## 2014-01-31 ENCOUNTER — Telehealth: Payer: Self-pay | Admitting: Internal Medicine

## 2014-01-31 ENCOUNTER — Telehealth: Payer: Self-pay | Admitting: Medical Oncology

## 2014-01-31 NOTE — Telephone Encounter (Signed)
Appointment for tomorrow.

## 2014-01-31 NOTE — Progress Notes (Signed)
Retro UR review completed

## 2014-01-31 NOTE — Telephone Encounter (Signed)
Pt cld to r/s from 07/30, pt in hospital, r/s for 08/04.Marland Kitchen..KJ

## 2014-02-01 ENCOUNTER — Telehealth: Payer: Self-pay | Admitting: Internal Medicine

## 2014-02-01 ENCOUNTER — Ambulatory Visit (HOSPITAL_BASED_OUTPATIENT_CLINIC_OR_DEPARTMENT_OTHER): Payer: Medicare Other | Admitting: Internal Medicine

## 2014-02-01 ENCOUNTER — Other Ambulatory Visit (HOSPITAL_BASED_OUTPATIENT_CLINIC_OR_DEPARTMENT_OTHER): Payer: Medicare Other

## 2014-02-01 ENCOUNTER — Ambulatory Visit: Payer: Self-pay | Admitting: Emergency Medicine

## 2014-02-01 ENCOUNTER — Encounter: Payer: Self-pay | Admitting: Internal Medicine

## 2014-02-01 VITALS — BP 120/72 | HR 65 | Temp 97.7°F | Resp 18 | Ht 64.0 in | Wt 191.4 lb

## 2014-02-01 DIAGNOSIS — C9000 Multiple myeloma not having achieved remission: Secondary | ICD-10-CM

## 2014-02-01 DIAGNOSIS — R5381 Other malaise: Secondary | ICD-10-CM

## 2014-02-01 DIAGNOSIS — R059 Cough, unspecified: Secondary | ICD-10-CM

## 2014-02-01 DIAGNOSIS — M549 Dorsalgia, unspecified: Secondary | ICD-10-CM

## 2014-02-01 DIAGNOSIS — C9002 Multiple myeloma in relapse: Secondary | ICD-10-CM

## 2014-02-01 DIAGNOSIS — R5383 Other fatigue: Secondary | ICD-10-CM

## 2014-02-01 DIAGNOSIS — R0989 Other specified symptoms and signs involving the circulatory and respiratory systems: Secondary | ICD-10-CM

## 2014-02-01 DIAGNOSIS — G609 Hereditary and idiopathic neuropathy, unspecified: Secondary | ICD-10-CM

## 2014-02-01 DIAGNOSIS — R05 Cough: Secondary | ICD-10-CM

## 2014-02-01 DIAGNOSIS — R0609 Other forms of dyspnea: Secondary | ICD-10-CM

## 2014-02-01 LAB — COMPREHENSIVE METABOLIC PANEL (CC13)
ALBUMIN: 3.6 g/dL (ref 3.5–5.0)
ALK PHOS: 79 U/L (ref 40–150)
ALT: 20 U/L (ref 0–55)
AST: 18 U/L (ref 5–34)
Anion Gap: 8 mEq/L (ref 3–11)
BUN: 13 mg/dL (ref 7.0–26.0)
CALCIUM: 9.5 mg/dL (ref 8.4–10.4)
CHLORIDE: 108 meq/L (ref 98–109)
CO2: 25 mEq/L (ref 22–29)
Creatinine: 0.8 mg/dL (ref 0.6–1.1)
Glucose: 110 mg/dl (ref 70–140)
Potassium: 4 mEq/L (ref 3.5–5.1)
SODIUM: 142 meq/L (ref 136–145)
Total Bilirubin: 1.29 mg/dL — ABNORMAL HIGH (ref 0.20–1.20)
Total Protein: 7 g/dL (ref 6.4–8.3)

## 2014-02-01 LAB — CBC WITH DIFFERENTIAL/PLATELET
BASO%: 1.4 % (ref 0.0–2.0)
Basophils Absolute: 0.1 10*3/uL (ref 0.0–0.1)
EOS%: 2.5 % (ref 0.0–7.0)
Eosinophils Absolute: 0.1 10*3/uL (ref 0.0–0.5)
HCT: 31.6 % — ABNORMAL LOW (ref 34.8–46.6)
HGB: 10.3 g/dL — ABNORMAL LOW (ref 11.6–15.9)
LYMPH#: 1.2 10*3/uL (ref 0.9–3.3)
LYMPH%: 30.4 % (ref 14.0–49.7)
MCH: 32.1 pg (ref 25.1–34.0)
MCHC: 32.4 g/dL (ref 31.5–36.0)
MCV: 99 fL (ref 79.5–101.0)
MONO#: 0.5 10*3/uL (ref 0.1–0.9)
MONO%: 11.3 % (ref 0.0–14.0)
NEUT#: 2.2 10*3/uL (ref 1.5–6.5)
NEUT%: 54.4 % (ref 38.4–76.8)
Platelets: 300 10*3/uL (ref 145–400)
RBC: 3.2 10*6/uL — AB (ref 3.70–5.45)
RDW: 18.6 % — AB (ref 11.2–14.5)
WBC: 4 10*3/uL (ref 3.9–10.3)

## 2014-02-01 LAB — LACTATE DEHYDROGENASE (CC13): LDH: 184 U/L (ref 125–245)

## 2014-02-01 NOTE — Telephone Encounter (Signed)
gv and printed appt scehd and avs for pt for Aug adn SEpt

## 2014-02-01 NOTE — Progress Notes (Signed)
[place] [phone]   Fax:([phone]) [place], [place] [address]  DIAGNOSIS: Recurrent multiple myeloma initially diagnosed in October 2008.   PRIOR THERAPY:  1. Status post palliative radiotherapy to the lumbar spine between L3 and L5. The patient received a total dose of 3000 cGy in 10 fractions under the care of Dr. Lisbeth Renshaw between May 05, 2007 through May 18, 2007. 2. Status post 5 cycles of systemic chemotherapy with Revlimid and low-dose Decadron with good response to this treatment. 3. Status post autologous peripheral blood stem cell transplant at [facility] on Nov 20, 2007 under the care of Dr. Valarie Merino. 4. Status post treatment for disease recurrence with Velcade, Doxil and Decadron. Last dose given Nov 09, 2009. Discontinued secondary to intolerance but the patient had a good response to treatment at that time. 5. Status post palliative radiotherapy to the T2-T6 thoracic vertebrae completed 03/21/2011 under the care of Dr. Lisbeth Renshaw. 6. Systemic chemotherapy with Velcade 1.3 mg per meter squared given on days 1, 4, 8 and 11, and Doxil at 30 mg per meter squared given on day 4 in addition to Decadron status post 4 cycles, discontinued secondary to intolerance. 7. Systemic therapy with Velcade 1.3 mg/M2 subcutaneously in addition to Decadron 40 mg by mouth on a weekly basis, status post 20 cycles. The patient had good response with this treatment but it is discontinue today secondary to worsening peripheral neuropathy. 8. Palliative radiotherapy to the skull lesion as well as the left hip area under the care of Dr. Lisbeth Renshaw. 9. Systemic chemotherapy with Carfilzomib 20 mg/M2 on days 1, 2,  8, 9, 13 and 16 every 4 weeks in addition to weekly Decadron 40 mg by mouth. First dose on 04/19/2013. Status post 4 cycles, discontinued recently secondary to cardiac  dysfunction.  CURRENT THERAPY:  1. Pomalyst 4 mg by mouth daily for 21 days every 4 weeks in addition to dexamethasone 40 mg on a weekly basis. First dose started 12/02/2013. 2. Zometa 4 mg IV given every 3 months   INTERVAL HISTORY: [name] 63 y.o. female returns to the clinic today for follow up visit accompanied by her daughter and friend. The patient is feeling fine today with no specific complaints except for generalized fatigue as well as the lower back pain in the neuropathy. She was recently admitted to [facility] complaining of numbness of the left arm and neck as well as tingling in the tongue and painful weakness of the right lower extremity. She had MRI/MRA of the head performed during that time that were negative but MRI of the C-spine showed possible nerve root compression at C6-7 on the left that could have explained the patient's symptoms. She was seen by Dr. Doy Mince from neurology and she has a followup appointment with him. She is feeling much better today. Her last dose of Pomalyst was given on 01/20/2014. She has been off treatment since that time. She denied having any significant fatigue or weakness. The patient denied having any significant chest pain, shortness of breath, or hemoptysis. She has no nausea or vomiting. She has no weight loss or night sweats. She continues to have mild peripheral neuropathy.   MEDICAL HISTORY: Past Medical History  Diagnosis Date  . Hypercholesterolemia   . Compression fracture 04/08/2007    pathologic compression fracture  . Hypothyroidism   . FHx: chemotherapy  s/p 5 cycle revlimid/low dose decadron,s/p velcade,doxil,decadron,  . Hx of radiation therapy 05/05/07-05/18/07,& 03/05/11-03/21/11-    l3&l5 in 2008, t2-t6 03/2011  . GERD (gastroesophageal reflux disease)   . Insomnia     associated with steroids  . Nausea   . Constipation     takes oxycontin,vicodin  . Hx of radiation therapy 05/05/2007 to 05/18/2007     palliative, L3-5  . Hx of radiation therapy 03/05/2011 to 03/21/2011    palliative T2-T6, c-spine  . 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 PONV (postoperative nausea and vomiting)   . Multiple myeloma dx'd 2009  . Metastasis to bone   . Family history of anesthesia complication     "daughter gets PONV too"  . Pneumonia     "several times"  . Stroke 2014    denies residual on 01/27/2014    ALLERGIES:  has No Known Allergies.  MEDICATIONS:  Current Outpatient Prescriptions  Medication Sig Dispense Refill  . ALPRAZolam (XANAX) 0.5 MG tablet Take 0.5 mg by mouth 3 (three) times daily as needed for anxiety.      Marland Kitchen atorvastatin (LIPITOR) 40 MG tablet Take 40 mg by mouth every evening.      . celecoxib (CELEBREX) 200 MG capsule Take 200 mg by mouth daily as needed for mild pain.      Marland Kitchen dexamethasone (DECADRON) 4 MG tablet Take 40 mg by mouth every Friday.       Marland Kitchen dexlansoprazole (DEXILANT) 60 MG capsule Take 60 mg by mouth every morning.      . Eszopiclone 3 MG TABS Take 3 mg by mouth at bedtime as needed (sleep). Take immediately before bedtime      . furosemide (LASIX) 40 MG tablet Take 40 mg by mouth 2 (two) times daily.      Marland Kitchen gabapentin (NEURONTIN) 600 MG tablet Take 600 mg by mouth 4 (four) times daily as needed (pain).      Marland Kitchen HYDROcodone-acetaminophen (NORCO) 7.5-325 MG per tablet Take 1 tablet by mouth every 6 (six) hours as needed for moderate pain.  30 tablet  0  . levothyroxine (SYNTHROID, LEVOTHROID) 100 MCG tablet Take 50-100 mcg by mouth daily before breakfast. Take 0.5 tab (39mg) on Monday, Wednesday, and Friday. Take 1 tab (1029m) on Sunday, Tuesday, Thursday, and Saturday.      . magnesium gluconate (MAGONATE) 500 MG tablet Take 500 mg by mouth every morning.      . Marland Kitchenorphine (MS CONTIN) 15 MG 12 hr tablet Take 1 tablet (15 mg total) by mouth every 12 (twelve) hours.  60 tablet  0  . Multiple Vitamin (MULITIVITAMIN WITH MINERALS) TABS Take  1 tablet by mouth every morning.       . ondansetron (ZOFRAN-ODT) 4 MG disintegrating tablet PLACE 1 TABLET ON THE TONGUE EVERY 8 HOURS AS NEEDED FOR NAUSEA  60 tablet  1  . pomalidomide (POMALYST) 4 MG capsule Take 1 capsule (4 mg total) by mouth daily. Take with water on days 1-21. Repeat every 28 days.  21 capsule  0  . potassium chloride (K-DUR) 10 MEQ tablet Take 10 mEq by mouth 2 (two) times daily.      . promethazine-dextromethorphan (PROMETHAZINE-DM) 6.25-15 MG/5ML syrup TAKE 5 MLS BY MOUTH 4 (FOUR) TIMES DAILY AS NEEDED FOR COUGH.  180 mL  0  . sennosides-docusate sodium (SENOKOT-S) 8.6-50 MG tablet Take 1 tablet by mouth daily.       No current facility-administered  medications for this visit.    SURGICAL HISTORY:  Past Surgical History  Procedure Laterality Date  . Knee arthroscopy Right     "put pin in"  . Knee arthroscopy Right     "took pin out and corrected what was wrong"  . Video bronchoscopy  07/30/2011    Procedure: VIDEO BRONCHOSCOPY WITHOUT FLUORO;  Surgeon: Kathee Delton, MD;  Location: [facility];  Service: Cardiopulmonary;  Laterality: Bilateral;  . Cholecystectomy  1980's  . Portacath placement Right 2009  . Vaginal hysterectomy  1980's    REVIEW OF SYSTEMS:  A comprehensive review of systems was negative except for: Constitutional: positive for fatigue Musculoskeletal: positive for back pain Neurological: positive for paresthesia   PHYSICAL EXAMINATION: General appearance: alert, cooperative, fatigued and no distress Head: Normocephalic, without obvious abnormality, atraumatic Neck: no adenopathy, no JVD, supple, symmetrical, trachea midline and thyroid not enlarged, symmetric, no tenderness/mass/nodules Lymph nodes: Cervical, supraclavicular, and axillary nodes normal. Resp: clear to auscultation bilaterally Back: symmetric, no curvature. ROM normal. No CVA tenderness. Cardio: regular rate and rhythm, S1, S2 normal, no murmur, click, rub or gallop GI:  soft, non-tender; bowel sounds normal; no masses,  no organomegaly Extremities: extremities normal, atraumatic, no cyanosis or edema Neurologic: Alert and oriented X 3, normal strength and tone. Normal symmetric reflexes. Normal coordination and gait  ECOG PERFORMANCE STATUS: 1 - Symptomatic but completely ambulatory  Blood pressure 120/72, pulse 65, temperature 97.7 F (36.5 C), temperature source Oral, resp. rate 18, height 5' 4"  (1.626 m), weight 191 lb 6.4 oz (86.818 kg), SpO2 100.00%.  LABORATORY DATA: Lab Results  Component Value Date   WBC 4.0 02/01/2014   HGB 10.3* 02/01/2014   HCT 31.6* 02/01/2014   MCV 99.0 02/01/2014   PLT 300 02/01/2014      Chemistry      Component Value Date/Time   NA 142 02/01/2014 1341   NA 141 01/27/2014 0500   K 4.0 02/01/2014 1341   K 4.1 01/27/2014 0500   CL 106 01/27/2014 0500   CL 105 12/17/2012 1015   CO2 25 02/01/2014 1341   CO2 25 01/27/2014 0500   BUN 13.0 02/01/2014 1341   BUN 10 01/27/2014 0500   CREATININE 0.8 02/01/2014 1341   CREATININE 0.75 01/27/2014 0500   CREATININE 0.78 11/04/2013 1100      Component Value Date/Time   CALCIUM 9.5 02/01/2014 1341   CALCIUM 8.7 01/27/2014 0500   ALKPHOS 79 02/01/2014 1341   ALKPHOS 79 01/26/2014 1613   AST 18 02/01/2014 1341   AST 12 01/26/2014 1613   ALT 20 02/01/2014 1341   ALT 16 01/26/2014 1613   BILITOT 1.29* 02/01/2014 1341   BILITOT 1.2 01/26/2014 1613     RADIOGRAPHIC STUDIES:  ASSESSMENT AND PLAN:  This is a very pleasant 63 years old [place] American female with recurrent multiple myeloma recently completed a course of treatment with Velcade and Decadron with improvement in her disease but this was discontinued secondary to peripheral neuropathy. The patient is tolerating her treatment with Carfilzomib and Decadron fairly well except for the recent shortness breath and cough which was felt to be secondary to congestive heart failure from her treatment with Carfilzomib. She was started on treatment with Pomalyst and  Decadron status post 2 cycles. She tolerated the second cycle of her treatment well. I recommended for the patient to have repeat myeloma panel next week. I recommended for her to continue cycle #3 of her treatment in the next few days once  she received her Pomalyst. She would come back for followup visit in 2 weeks for evaluation and discussion of further treatment options based on the myeloma panel. The patient will continue her current treatment with Zometa every 3 months as scheduled. She was advised to call immediately if she has any concerning symptoms in the interval. The patient voices understanding of current disease status and treatment options and is in agreement with the current care plan.  All questions were answered. The patient knows to call the clinic with any problems, questions or concerns. We can certainly see the patient much sooner if necessary.  Disclaimer: This note was dictated with voice recognition software. Similar sounding words can inadvertently be transcribed and may be missed upon review.

## 2014-02-03 ENCOUNTER — Other Ambulatory Visit: Payer: Self-pay | Admitting: *Deleted

## 2014-02-03 DIAGNOSIS — M5380 Other specified dorsopathies, site unspecified: Secondary | ICD-10-CM

## 2014-02-03 DIAGNOSIS — Z5189 Encounter for other specified aftercare: Secondary | ICD-10-CM

## 2014-02-03 DIAGNOSIS — R1310 Dysphagia, unspecified: Secondary | ICD-10-CM

## 2014-02-03 DIAGNOSIS — C9000 Multiple myeloma not having achieved remission: Secondary | ICD-10-CM

## 2014-02-03 MED ORDER — POMALIDOMIDE 4 MG PO CAPS: 4.0000 mg | ORAL_CAPSULE | Freq: Every day | ORAL | Status: DC

## 2014-02-04 ENCOUNTER — Telehealth: Payer: Self-pay | Admitting: *Deleted

## 2014-02-04 NOTE — Telephone Encounter (Signed)
Clair Gulling, physical therapist from [facility], called and requested PT 2 times a week x 4 weeks and 1 time for 1 week. Also, referring for OT and Speech for evaluations.  Ok per Dr Melford Aase.  Patient having difficulty swallowing.

## 2014-02-07 ENCOUNTER — Other Ambulatory Visit: Payer: Self-pay | Admitting: Internal Medicine

## 2014-02-07 ENCOUNTER — Telehealth: Payer: Self-pay | Admitting: *Deleted

## 2014-02-07 ENCOUNTER — Other Ambulatory Visit: Payer: Self-pay | Admitting: Emergency Medicine

## 2014-02-07 ENCOUNTER — Telehealth: Payer: Self-pay | Admitting: Medical Oncology

## 2014-02-07 NOTE — Telephone Encounter (Signed)
[name], speech therapist form [facility], called and requested order for swallowing test for patient.  She states she will schedule procedure at hospital and fax the order for Dr Melford Aase to sign.  OK per Dr Melford Aase.  Per [name], no need to call back unless the doctor does not agree.

## 2014-02-07 NOTE — Telephone Encounter (Signed)
[place], physical therapist with [facility], called and requested an order for a vestibular evaluation.  OK per Dr Melford Aase.  Left message to inform the therapist.

## 2014-02-07 NOTE — Telephone Encounter (Signed)
Received fax confirmation of Pomalyst shipment.

## 2014-02-08 ENCOUNTER — Other Ambulatory Visit (HOSPITAL_BASED_OUTPATIENT_CLINIC_OR_DEPARTMENT_OTHER): Payer: Medicare Other

## 2014-02-08 DIAGNOSIS — C9002 Multiple myeloma in relapse: Secondary | ICD-10-CM

## 2014-02-08 DIAGNOSIS — C9000 Multiple myeloma not having achieved remission: Secondary | ICD-10-CM

## 2014-02-08 LAB — CBC WITH DIFFERENTIAL/PLATELET
BASO%: 1.4 % (ref 0.0–2.0)
Basophils Absolute: 0.1 10*3/uL (ref 0.0–0.1)
EOS ABS: 0.3 10*3/uL (ref 0.0–0.5)
EOS%: 7 % (ref 0.0–7.0)
HEMATOCRIT: 31.4 % — AB (ref 34.8–46.6)
HGB: 10.1 g/dL — ABNORMAL LOW (ref 11.6–15.9)
LYMPH%: 21.1 % (ref 14.0–49.7)
MCH: 32 pg (ref 25.1–34.0)
MCHC: 32.1 g/dL (ref 31.5–36.0)
MCV: 99.6 fL (ref 79.5–101.0)
MONO#: 0.3 10*3/uL (ref 0.1–0.9)
MONO%: 6.2 % (ref 0.0–14.0)
NEUT#: 2.7 10*3/uL (ref 1.5–6.5)
NEUT%: 64.3 % (ref 38.4–76.8)
PLATELETS: 269 10*3/uL (ref 145–400)
RBC: 3.15 10*6/uL — ABNORMAL LOW (ref 3.70–5.45)
RDW: 19.5 % — ABNORMAL HIGH (ref 11.2–14.5)
WBC: 4.1 10*3/uL (ref 3.9–10.3)
lymph#: 0.9 10*3/uL (ref 0.9–3.3)

## 2014-02-10 ENCOUNTER — Telehealth: Payer: Self-pay | Admitting: Nurse Practitioner

## 2014-02-10 NOTE — Telephone Encounter (Signed)
Per Abelina Bachelor, RN to Dr. Vista Mink, this RN asked patient to request her insurance company to resend papers to our office. Unable to locate documents of interest. Fax number for Dr. Worthy Flank RN given, [phone], and patient verbalizes understanding.

## 2014-02-11 ENCOUNTER — Other Ambulatory Visit (HOSPITAL_COMMUNITY): Payer: Self-pay | Admitting: Internal Medicine

## 2014-02-11 ENCOUNTER — Ambulatory Visit (HOSPITAL_COMMUNITY)
Admission: RE | Admit: 2014-02-11 | Discharge: 2014-02-11 | Disposition: A | Discharge status: Home / Self Care | Payer: Medicare Other | Source: Ambulatory Visit | Attending: Internal Medicine | Admitting: Internal Medicine

## 2014-02-11 ENCOUNTER — Ambulatory Visit (HOSPITAL_COMMUNITY): Payer: Medicare Other

## 2014-02-11 DIAGNOSIS — Z87898 Personal history of other specified conditions: Secondary | ICD-10-CM | POA: Diagnosis not present

## 2014-02-11 DIAGNOSIS — Z923 Personal history of irradiation: Secondary | ICD-10-CM | POA: Insufficient documentation

## 2014-02-11 DIAGNOSIS — K219 Gastro-esophageal reflux disease without esophagitis: Secondary | ICD-10-CM | POA: Insufficient documentation

## 2014-02-11 DIAGNOSIS — R1310 Dysphagia, unspecified: Secondary | ICD-10-CM

## 2014-02-11 DIAGNOSIS — R1311 Dysphagia, oral phase: Secondary | ICD-10-CM | POA: Insufficient documentation

## 2014-02-11 DIAGNOSIS — R1319 Other dysphagia: Secondary | ICD-10-CM | POA: Insufficient documentation

## 2014-02-11 DIAGNOSIS — Z85830 Personal history of malignant neoplasm of bone: Secondary | ICD-10-CM | POA: Insufficient documentation

## 2014-02-11 DIAGNOSIS — E039 Hypothyroidism, unspecified: Secondary | ICD-10-CM | POA: Diagnosis not present

## 2014-02-11 DIAGNOSIS — E7800 Pure hypercholesterolemia, unspecified: Secondary | ICD-10-CM | POA: Diagnosis not present

## 2014-02-11 DIAGNOSIS — Z8673 Personal history of transient ischemic attack (TIA), and cerebral infarction without residual deficits: Secondary | ICD-10-CM | POA: Diagnosis not present

## 2014-02-11 LAB — IGG, IGA, IGM
IGG (IMMUNOGLOBIN G), SERUM: 1090 mg/dL (ref 690–1700)
IGM, SERUM: 24 mg/dL — AB (ref 52–322)
IgA: 84 mg/dL (ref 69–380)

## 2014-02-11 LAB — COMPREHENSIVE METABOLIC PANEL
ALBUMIN: 3.6 g/dL (ref 3.5–5.2)
ALT: 15 U/L (ref 0–35)
AST: 14 U/L (ref 0–37)
Alkaline Phosphatase: 61 U/L (ref 39–117)
BUN: 9 mg/dL (ref 6–23)
CO2: 27 mEq/L (ref 19–32)
Calcium: 8.8 mg/dL (ref 8.4–10.5)
Chloride: 106 mEq/L (ref 96–112)
Creatinine, Ser: 0.79 mg/dL (ref 0.50–1.10)
GLUCOSE: 104 mg/dL — AB (ref 70–99)
POTASSIUM: 3.4 meq/L — AB (ref 3.5–5.3)
SODIUM: 138 meq/L (ref 135–145)
TOTAL PROTEIN: 6 g/dL (ref 6.0–8.3)
Total Bilirubin: 1.1 mg/dL (ref 0.2–1.2)

## 2014-02-11 LAB — KAPPA/LAMBDA LIGHT CHAINS
Kappa free light chain: 1.56 mg/dL (ref 0.33–1.94)
Kappa:Lambda Ratio: 0.14 — ABNORMAL LOW (ref 0.26–1.65)
Lambda Free Lght Chn: 10.9 mg/dL — ABNORMAL HIGH (ref 0.57–2.63)

## 2014-02-11 LAB — LACTATE DEHYDROGENASE: LDH: 134 U/L (ref 94–250)

## 2014-02-11 LAB — BETA 2 MICROGLOBULIN, SERUM: BETA 2 MICROGLOBULIN: 2.15 mg/L (ref <=2.51)

## 2014-02-11 NOTE — Procedures (Signed)
Objective Swallowing Evaluation: Modified Barium Swallowing Study  Patient Details  Name: [name] MRN: [id number] Date of Birth: 03/15/1951  Today's Date: 02/11/2014 Time: 1315-1400 SLP Time Calculation (min): 45 min  Past Medical History:  Past Medical History  Diagnosis Date  . Hypercholesterolemia   . Compression fracture 04/08/2007    pathologic compression fracture  . Hypothyroidism   . FHx: chemotherapy     s/p 5 cycle revlimid/low dose decadron,s/p velcade,doxil,decadron,  . Hx of radiation therapy 05/05/07-05/18/07,& 03/05/11-03/21/11-    l3&l5 in 2008, t2-t6 03/2011  . GERD (gastroesophageal reflux disease)   . Insomnia     associated with steroids  . Nausea   . Constipation     takes oxycontin,vicodin  . Hx of radiation therapy 05/05/2007 to 05/18/2007    palliative, L3-5  . Hx of radiation therapy 03/05/2011 to 03/21/2011    palliative T2-T6, c-spine  . 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 PONV (postoperative nausea and vomiting)   . Multiple myeloma dx'd 2009  . Metastasis to bone   . Family history of anesthesia complication     "daughter gets PONV too"  . Pneumonia     "several times"  . Stroke 2014    denies residual on 01/27/2014   Past Surgical History:  Past Surgical History  Procedure Laterality Date  . Knee arthroscopy Right     "put pin in"  . Knee arthroscopy Right     "took pin out and corrected what was wrong"  . Video bronchoscopy  07/30/2011    Procedure: VIDEO BRONCHOSCOPY WITHOUT FLUORO;  Surgeon: Kathee Delton, MD;  Location: [facility];  Service: Cardiopulmonary;  Laterality: Bilateral;  . Cholecystectomy  1980's  . Portacath placement Right 2009  . Vaginal hysterectomy  1980's   HPI:  63 yo female referred by [facility] SLP for MBS due to concerns pt may be aspirating.  Pt PMH for multiple myeloma, acid reflux/indigestion, xerostomia, pna (x2 nearly back to back per pt), myositis approximately a few  years during treatment.  Pt states she piecemeals food/drink to compensate for her dysphagia. Pt denies requiring heimlich manuever nor significant weight loss.         Assessment / Plan / Recommendation Clinical Impression  Dysphagia Diagnosis: Mild oral phase dysphagia;Mild cervical esophageal phase dysphagia Clinical impression: Minimal oral dysphagia characterized by delayed oral transitng which SLP *suspects is compensatory for mild cervical esophageal phase dysphagia.  Pharyngeal swallow was timely with adequate pharyngeal contraction, laryngeal elevation.  Cervical esophagus noted with prominent CP/decreased UES opening resulting in LIQUID stasis and minimal backflow that pt effectively clears with non-cued dry swallows.  Please note on 2 occasions pt sensed pharyngeal stasis when she appeared with trace residuals at distal esophagus -*radiologist not present* causing SLP to suspect referrant sensation due to vagal nerve.    Pt observed taking barium tablet with thin with adequate oropharyngeal clearance. Pt was noted to hyperextend head when taking tablet reportedly to aid oral and pharyngeal transiting.  Pt also admits to worsening symptoms with cold items and sensation of a knot in her pharynx which SLP suspects may be consistent with UES spasm.  SLP educated pt to findings, effective compensation strategies to mitigate her dysphagia.   Thanks for this referral.      Treatment Recommendation  Other (Comment) (defer to [facility] SLP)    Diet Recommendation Regular;Thin liquid   Liquid Administration via: Cup;Straw Medication Administration: Whole meds with liquid (  as tolerated) Supervision: Patient able to self feed Compensations: Slow rate;Small sips/bites (consume liquids throughout meal) Postural Changes and/or Swallow Maneuvers: Seated upright 90 degrees;Upright 30-60 min after meal    Other  Recommendations Oral Care Recommendations: Oral care BID   Follow Up Recommendations  Home health  SLP         General Date of Onset: 02/11/14 HPI: 63 yo female referred by [facility] SLP for MBS due to concerns pt may be aspirating.  Pt PMH for multiple myeloma, acid reflux/indigestion, xerostomia, pna (x2 nearly back to back per pt), myositis approximately a few years during treatment.  Pt states she piecemeals food/drink to compensate for her dysphagia. Pt denies requiring heimlich manuever nor significant weight loss.     Type of Study: Modified Barium Swallowing Study Reason for Referral: Objectively evaluate swallowing function Diet Prior to this Study: Regular;Thin liquids Temperature Spikes Noted: No Respiratory Status: Room air Behavior/Cognition: Alert;Cooperative;Pleasant mood Oral Motor / Sensory Function: Within functional limits Self-Feeding Abilities: Able to feed self Patient Positioning: Upright in bed Baseline Vocal Quality: Clear Volitional Cough: Strong Volitional Swallow: Able to elicit Anatomy: Within functional limits Pharyngeal Secretions: Not observed secondary MBS    Reason for Referral Objectively evaluate swallowing function   Oral Phase Oral Preparation/Oral Phase Oral Phase: Impaired Oral - Nectar Oral - Nectar Cup: Piecemeal swallowing;Holding of bolus Oral - Thin Oral - Thin Cup: Piecemeal swallowing;Holding of bolus Oral - Thin Straw: Piecemeal swallowing;Holding of bolus Oral - Solids Oral - Puree: Piecemeal swallowing;Holding of bolus Oral - Regular: Piecemeal swallowing;Holding of bolus Oral - Pill: Piecemeal swallowing;Holding of bolus Oral Phase - Comment Oral Phase - Comment: minimal holding of bolus, suspect compensatory, piecemealing effective as compensation strategy   Pharyngeal Phase Pharyngeal Phase Pharyngeal Phase: Impaired Pharyngeal - Nectar Pharyngeal - Nectar Cup: Within functional limits;Pharyngeal residue - cp segment Pharyngeal - Thin Pharyngeal - Thin Cup: Pharyngeal residue - cp segment;Pharyngeal residue -  valleculae;Pharyngeal residue - pyriform sinuses Pharyngeal - Thin Straw: Pharyngeal residue - valleculae;Pharyngeal residue - pyriform sinuses;Pharyngeal residue - cp segment (large sequential boluses resulted in barium residuals) Pharyngeal - Solids Pharyngeal - Puree: Within functional limits Pharyngeal - Regular: Within functional limits Pharyngeal - Pill: Within functional limits  Cervical Esophageal Phase    GO    Cervical Esophageal Phase Cervical Esophageal Phase: Impaired Cervical Esophageal Phase - Nectar Nectar Cup: Reduced cricopharyngeal relaxation;Esophageal backflow into the pharynx Cervical Esophageal Phase - Thin Thin Cup: Reduced cricopharyngeal relaxation;Esophageal backflow into the pharynx Thin Straw: Reduced cricopharyngeal relaxation;Esophageal backflow into the pharynx Cervical Esophageal Phase - Comment Cervical Esophageal Comment: trace backflow of liquids into pharynx, dry swallows effectively clears pharynx    Functional Assessment Tool Used: mbs, clinical judgement Functional Limitations: Swallowing Swallow Current Status ([id number]): At least 1 percent but less than 20 percent impaired, limited or restricted Swallow Goal Status [phone]): At least 1 percent but less than 20 percent impaired, limited or restricted Swallow Discharge Status [phone]): At least 1 percent but less than 20 percent impaired, limited or restricted    Luanna Salk, [facility] SLP [phone]

## 2014-02-12 ENCOUNTER — Other Ambulatory Visit: Payer: Self-pay | Admitting: Emergency Medicine

## 2014-02-15 ENCOUNTER — Encounter: Payer: Self-pay | Admitting: Physician Assistant

## 2014-02-15 ENCOUNTER — Ambulatory Visit (HOSPITAL_BASED_OUTPATIENT_CLINIC_OR_DEPARTMENT_OTHER): Payer: Medicare Other | Admitting: Physician Assistant

## 2014-02-15 ENCOUNTER — Telehealth: Payer: Self-pay | Admitting: Internal Medicine

## 2014-02-15 VITALS — BP 132/61 | HR 79 | Temp 98.0°F | Resp 18 | Ht 64.0 in | Wt 195.1 lb

## 2014-02-15 DIAGNOSIS — M545 Low back pain, unspecified: Secondary | ICD-10-CM

## 2014-02-15 DIAGNOSIS — R5381 Other malaise: Secondary | ICD-10-CM

## 2014-02-15 DIAGNOSIS — G609 Hereditary and idiopathic neuropathy, unspecified: Secondary | ICD-10-CM

## 2014-02-15 DIAGNOSIS — C9002 Multiple myeloma in relapse: Secondary | ICD-10-CM

## 2014-02-15 DIAGNOSIS — C9000 Multiple myeloma not having achieved remission: Secondary | ICD-10-CM

## 2014-02-15 DIAGNOSIS — R5383 Other fatigue: Secondary | ICD-10-CM

## 2014-02-15 MED ORDER — HYDROCODONE-ACETAMINOPHEN 7.5-325 MG PO TABS: 1.0000 | ORAL_TABLET | Freq: Four times a day (QID) | ORAL | Status: DC | PRN

## 2014-02-15 MED ORDER — MORPHINE SULFATE ER 15 MG PO TBCR: 15.0000 mg | EXTENDED_RELEASE_TABLET | Freq: Two times a day (BID) | ORAL | Status: DC

## 2014-02-15 NOTE — Progress Notes (Signed)
[place] [phone]   [phone]  SHARED VISIT PROGRESS NOTE  [name], [address] [place] [address]  DIAGNOSIS: Recurrent multiple myeloma initially diagnosed in October 2008.   PRIOR THERAPY:  1. Status post palliative radiotherapy to the lumbar spine between L3 and L5. The patient received a total dose of 3000 cGy in 10 fractions under the care of Dr. Lisbeth Renshaw between May 05, 2007 through May 18, 2007. 2. Status post 5 cycles of systemic chemotherapy with Revlimid and low-dose Decadron with good response to this treatment. 3. Status post autologous peripheral blood stem cell transplant at [facility] on Nov 20, 2007 under the care of Dr. Valarie Merino. 4. Status post treatment for disease recurrence with Velcade, Doxil and Decadron. Last dose given Nov 09, 2009. Discontinued secondary to intolerance but the patient had a good response to treatment at that time. 5. Status post palliative radiotherapy to the T2-T6 thoracic vertebrae completed 03/21/2011 under the care of Dr. Lisbeth Renshaw. 6. Systemic chemotherapy with Velcade 1.3 mg per meter squared given on days 1, 4, 8 and 11, and Doxil at 30 mg per meter squared given on day 4 in addition to Decadron status post 4 cycles, discontinued secondary to intolerance. 7. Systemic therapy with Velcade 1.3 mg/M2 subcutaneously in addition to Decadron 40 mg by mouth on a weekly basis, status post 20 cycles. The patient had good response with this treatment but it is discontinue today secondary to worsening peripheral neuropathy. 8. Palliative radiotherapy to the skull lesion as well as the left hip area under the care of Dr. Lisbeth Renshaw. 9. Systemic chemotherapy with Carfilzomib 20 mg/M2 on days 1, 2,  8, 9, 13 and 16 every 4 weeks in addition to weekly Decadron 40 mg by mouth. First dose on 04/19/2013. Status post 4 cycles, discontinued recently secondary to cardiac  dysfunction.  CURRENT THERAPY:  1. Pomalyst 4 mg by mouth daily for 21 days every 4 weeks in addition to dexamethasone 40 mg on a weekly basis. First dose started 12/02/2013. 2. Zometa 4 mg IV given every 3 months   INTERVAL HISTORY: [name] 63 y.o. female returns to the clinic today for follow up visit accompanied by her daughter. The patient is feeling fine today with no specific complaints except for generalized fatigue as well as the lower back pain and the neuropathy. She started her current cycle of Pomalyst  on 02/04/2014. She continues to complain of stomach pain. She requests refill prescriptions for her pain medications. . She denied having any significant fatigue or weakness. The patient denied having any significant chest pain, shortness of breath, or hemoptysis. She has no nausea or vomiting. She has no weight loss or night sweats. She continues to have mild peripheral neuropathy. She recently had repeat myeloma panel to re-evaluate her disease and is here to discuss the results.  MEDICAL HISTORY: Past Medical History  Diagnosis Date  . Hypercholesterolemia   . Compression fracture 04/08/2007    pathologic compression fracture  . Hypothyroidism   . FHx: chemotherapy     s/p 5 cycle revlimid/low dose decadron,s/p velcade,doxil,decadron,  . Hx of radiation therapy 05/05/07-05/18/07,& 03/05/11-03/21/11-    l3&l5 in 2008, t2-t6 03/2011  . GERD (gastroesophageal reflux disease)   . Insomnia     associated with steroids  . Nausea   . Constipation     takes oxycontin,vicodin  . Hx of radiation therapy 05/05/2007 to 05/18/2007  palliative, L3-5  . Hx of radiation therapy 03/05/2011 to 03/21/2011    palliative T2-T6, c-spine  . 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 PONV (postoperative nausea and vomiting)   . Multiple myeloma dx'd 2009  . Metastasis to bone   . Family history of anesthesia complication     "daughter gets PONV too"  .  Pneumonia     "several times"  . Stroke 2014    denies residual on 01/27/2014    ALLERGIES:  has No Known Allergies.  MEDICATIONS:  Current Outpatient Prescriptions  Medication Sig Dispense Refill  . ALPRAZolam (XANAX) 0.5 MG tablet Take 0.5 mg by mouth 3 (three) times daily as needed for anxiety.      Marland Kitchen atorvastatin (LIPITOR) 40 MG tablet TAKE 1 TABLET (40 MG TOTAL) BY MOUTH DAILY.  30 tablet  3  . celecoxib (CELEBREX) 200 MG capsule Take 200 mg by mouth daily as needed for mild pain.      Marland Kitchen dexamethasone (DECADRON) 4 MG tablet Take 40 mg by mouth every Friday.       Marland Kitchen dexlansoprazole (DEXILANT) 60 MG capsule Take 60 mg by mouth every morning.      . Eszopiclone 3 MG TABS Take 3 mg by mouth at bedtime as needed (sleep). Take immediately before bedtime      . furosemide (LASIX) 40 MG tablet TAKE 1 TABLET BY MOUTH TWICE A DAY FOR FLUID AND SWELLING  60 tablet  10  . gabapentin (NEURONTIN) 600 MG tablet Take 600 mg by mouth 4 (four) times daily as needed (pain).      Marland Kitchen HYDROcodone-acetaminophen (NORCO) 7.5-325 MG per tablet Take 1 tablet by mouth every 6 (six) hours as needed for moderate pain.  30 tablet  0  . KLOR-CON M10 10 MEQ tablet TAKE 1 TABLET BY MOUTH TWICE A DAY  60 tablet  3  . levothyroxine (SYNTHROID, LEVOTHROID) 100 MCG tablet Take 50-100 mcg by mouth daily before breakfast. Take 0.5 tab (21mg) on Monday, Wednesday, and Friday. Take 1 tab (1050m) on Sunday, Tuesday, Thursday, and Saturday.      . magnesium gluconate (MAGONATE) 500 MG tablet Take 500 mg by mouth every morning.      . Marland Kitchenorphine (MS CONTIN) 15 MG 12 hr tablet Take 1 tablet (15 mg total) by mouth every 12 (twelve) hours.  60 tablet  0  . Multiple Vitamin (MULITIVITAMIN WITH MINERALS) TABS Take 1 tablet by mouth every morning.       . ondansetron (ZOFRAN-ODT) 4 MG disintegrating tablet PLACE 1 TABLET ON THE TONGUE EVERY 8 HOURS AS NEEDED FOR NAUSEA  60 tablet  1  . pomalidomide (POMALYST) 4 MG capsule Take 1 capsule  (4 mg total) by mouth daily. Take with water on days 1-21. Repeat every 28 days.  21 capsule  0  . sennosides-docusate sodium (SENOKOT-S) 8.6-50 MG tablet Take 1 tablet by mouth daily.      . promethazine-dextromethorphan (PROMETHAZINE-DM) 6.25-15 MG/5ML syrup TAKE 5 MLS BY MOUTH 4 (FOUR) TIMES DAILY AS NEEDED FOR COUGH.  180 mL  0   No current facility-administered medications for this visit.    SURGICAL HISTORY:  Past Surgical History  Procedure Laterality Date  . Knee arthroscopy Right     "put pin in"  . Knee arthroscopy Right     "took pin out and corrected what was wrong"  . Video bronchoscopy  07/30/2011    Procedure: VIDEO BRONCHOSCOPY WITHOUT  FLUORO;  Surgeon: Kathee Delton, MD;  Location: [facility];  Service: Cardiopulmonary;  Laterality: Bilateral;  . Cholecystectomy  1980's  . Portacath placement Right 2009  . Vaginal hysterectomy  1980's    REVIEW OF SYSTEMS:  A comprehensive review of systems was negative except for: Constitutional: positive for fatigue Gastrointestinal: positive for abdominal pain Musculoskeletal: positive for back pain Neurological: positive for paresthesia   PHYSICAL EXAMINATION: General appearance: alert, cooperative, fatigued and no distress Head: Normocephalic, without obvious abnormality, atraumatic Neck: no adenopathy, no JVD, supple, symmetrical, trachea midline and thyroid not enlarged, symmetric, no tenderness/mass/nodules Lymph nodes: Cervical, supraclavicular, and axillary nodes normal. Resp: clear to auscultation bilaterally Back: symmetric, no curvature. ROM normal. No CVA tenderness. Cardio: regular rate and rhythm, S1, S2 normal, no murmur, click, rub or gallop GI: soft, non-tender; bowel sounds normal; no masses,  no organomegaly Extremities: extremities normal, atraumatic, no cyanosis or edema Neurologic: Alert and oriented X 3, normal strength and tone. Normal symmetric reflexes. Normal coordination and gait  ECOG PERFORMANCE  STATUS: 1 - Symptomatic but completely ambulatory  Blood pressure 132/61, pulse 79, temperature 98 F (36.7 C), temperature source Oral, resp. rate 18, height _0  (1.626 m), weight 195 lb 1.6 oz (88.497 kg), SpO2 100.00%.  LABORATORY DATA: Lab Results  Component Value Date   WBC 4.1 02/08/2014   HGB 10.1* 02/08/2014   HCT 31.4* 02/08/2014   MCV 99.6 02/08/2014   PLT 269 02/08/2014      Chemistry      Component Value Date/Time   NA 138 02/08/2014 1030   NA 142 02/01/2014 1341   K 3.4* 02/08/2014 1030   K 4.0 02/01/2014 1341   CL 106 02/08/2014 1030   CL 105 12/17/2012 1015   CO2 27 02/08/2014 1030   CO2 25 02/01/2014 1341   BUN 9 02/08/2014 1030   BUN 13.0 02/01/2014 1341   CREATININE 0.79 02/08/2014 1030   CREATININE 0.8 02/01/2014 1341   CREATININE 0.78 11/04/2013 1100      Component Value Date/Time   CALCIUM 8.8 02/08/2014 1030   CALCIUM 9.5 02/01/2014 1341   ALKPHOS 61 02/08/2014 1030   ALKPHOS 79 02/01/2014 1341   AST 14 02/08/2014 1030   AST 18 02/01/2014 1341   ALT 15 02/08/2014 1030   ALT 20 02/01/2014 1341   BILITOT 1.1 02/08/2014 1030   BILITOT 1.29* 02/01/2014 1341     RADIOGRAPHIC STUDIES:  ASSESSMENT AND PLAN:  This is a very pleasant 63 years old [place] American female with recurrent multiple myeloma recently completed a course of treatment with Velcade and Decadron with improvement in her disease but this was discontinued secondary to peripheral neuropathy. The patient was tolerating her treatment with Carfilzomib and Decadron fairly well except for the recent shortness breath and cough which was felt to be secondary to congestive heart failure from her treatment with Carfilzomib. She was started on treatment with Pomalyst and Decadron status post 2 cycles. She is tolerating this treatment without significant difficulty. Her recent myeloma panel revealed some improvement in her disease. The patient was discussed with and also seen by Dr. Julien Nordmann. She will complete cycle #3 of Pomalyst as  prescribed. She was given refill prescriptions for bother  Her hydrocodone and morphine. She is to follow up with her primary care physician and/or gastroenterologist regarding her stomach pain. She will follow up in 2 weeks for another symptom management visit and Zometa. She will then be seen on a monthly basis. The patient will  continue her current treatment with Zometa every 3 months as scheduled.  She was advised to call immediately if she has any concerning symptoms in the interval. The patient voices understanding of current disease status and treatment options and is in agreement with the current care plan.  All questions were answered. The patient knows to call the clinic with any problems, questions or concerns. We can certainly see the patient much sooner if necessary.  Carlton Adam [name]  ADDENDUM: Hematology/Oncology Attending: I had a face to face encounter with the patient. I recommended her care plan. This is a very pleasant 63 years old [place] American female with relapsed multiple myeloma currently on treatment with Pomalyst and Decadron. She status post 3 cycles. She is tolerating her treatment well with no significant adverse effects except for the generalized fatigue and persistent low back pain and peripheral neuropathy. Her recent myeloma panel showed mild improvement in her disease. I discussed you that result with the patient and her daughter. I recommended for her to continue her current treatment with Pomalyst and Decadron. The patient would also continue on Zometa every 3 months. She would come back for followup visit in 2 weeks with the start of cycle #4. She was advised to call immediately if she has any concerning symptoms in the interval.  Disclaimer: This note was dictated with voice recognition software. Similar sounding words can inadvertently be transcribed and may be missed upon review. Eilleen Kempf., MD 02/19/2014

## 2014-02-15 NOTE — Progress Notes (Signed)
Error

## 2014-02-15 NOTE — Telephone Encounter (Signed)
gv and printed appt sched and avs for pt fro [facility]...sed added tx.

## 2014-02-17 ENCOUNTER — Ambulatory Visit (INDEPENDENT_AMBULATORY_CARE_PROVIDER_SITE_OTHER): Payer: Medicare Other | Admitting: Physician Assistant

## 2014-02-17 ENCOUNTER — Encounter: Payer: Self-pay | Admitting: Physician Assistant

## 2014-02-17 ENCOUNTER — Telehealth: Payer: Self-pay | Admitting: *Deleted

## 2014-02-17 VITALS — BP 110/70 | HR 80 | Temp 98.1°F | Resp 16 | Ht 64.0 in | Wt 196.0 lb

## 2014-02-17 DIAGNOSIS — G8929 Other chronic pain: Secondary | ICD-10-CM

## 2014-02-17 DIAGNOSIS — R200 Anesthesia of skin: Secondary | ICD-10-CM

## 2014-02-17 DIAGNOSIS — E559 Vitamin D deficiency, unspecified: Secondary | ICD-10-CM

## 2014-02-17 DIAGNOSIS — C9000 Multiple myeloma not having achieved remission: Secondary | ICD-10-CM

## 2014-02-17 DIAGNOSIS — R1013 Epigastric pain: Secondary | ICD-10-CM

## 2014-02-17 DIAGNOSIS — E039 Hypothyroidism, unspecified: Secondary | ICD-10-CM

## 2014-02-17 DIAGNOSIS — E1129 Type 2 diabetes mellitus with other diabetic kidney complication: Secondary | ICD-10-CM

## 2014-02-17 DIAGNOSIS — IMO0001 Reserved for inherently not codable concepts without codable children: Secondary | ICD-10-CM

## 2014-02-17 DIAGNOSIS — K2100 Gastro-esophageal reflux disease with esophagitis, without bleeding: Secondary | ICD-10-CM

## 2014-02-17 DIAGNOSIS — Z79899 Other long term (current) drug therapy: Secondary | ICD-10-CM

## 2014-02-17 DIAGNOSIS — Z0000 Encounter for general adult medical examination without abnormal findings: Secondary | ICD-10-CM

## 2014-02-17 DIAGNOSIS — E782 Mixed hyperlipidemia: Secondary | ICD-10-CM

## 2014-02-17 DIAGNOSIS — I10 Essential (primary) hypertension: Secondary | ICD-10-CM

## 2014-02-17 DIAGNOSIS — E669 Obesity, unspecified: Secondary | ICD-10-CM

## 2014-02-17 DIAGNOSIS — R202 Paresthesia of skin: Secondary | ICD-10-CM

## 2014-02-17 LAB — CBC WITH DIFFERENTIAL/PLATELET
BASOS ABS: 0 10*3/uL (ref 0.0–0.1)
BASOS PCT: 0 % (ref 0–1)
Eosinophils Absolute: 0.2 10*3/uL (ref 0.0–0.7)
Eosinophils Relative: 6 % — ABNORMAL HIGH (ref 0–5)
HCT: 32 % — ABNORMAL LOW (ref 36.0–46.0)
Hemoglobin: 10.4 g/dL — ABNORMAL LOW (ref 12.0–15.0)
Lymphocytes Relative: 17 % (ref 12–46)
Lymphs Abs: 0.6 10*3/uL — ABNORMAL LOW (ref 0.7–4.0)
MCH: 32.1 pg (ref 26.0–34.0)
MCHC: 32.5 g/dL (ref 30.0–36.0)
MCV: 98.8 fL (ref 78.0–100.0)
MONO ABS: 0.4 10*3/uL (ref 0.1–1.0)
Monocytes Relative: 12 % (ref 3–12)
Neutro Abs: 2.2 10*3/uL (ref 1.7–7.7)
Neutrophils Relative %: 65 % (ref 43–77)
Platelets: 229 10*3/uL (ref 150–400)
RBC: 3.24 MIL/uL — ABNORMAL LOW (ref 3.87–5.11)
RDW: 18.2 % — AB (ref 11.5–15.5)
WBC: 3.4 10*3/uL — ABNORMAL LOW (ref 4.0–10.5)

## 2014-02-17 LAB — HEMOGLOBIN A1C
Hgb A1c MFr Bld: 6 % — ABNORMAL HIGH (ref <5.7)
Mean Plasma Glucose: 126 mg/dL — ABNORMAL HIGH (ref <117)

## 2014-02-17 MED ORDER — PANTOPRAZOLE SODIUM 40 MG PO TBEC: 40.0000 mg | DELAYED_RELEASE_TABLET | Freq: Every day | ORAL | Status: DC

## 2014-02-17 NOTE — Patient Instructions (Addendum)
Stop the lunesta and try the Belsomra samples 10mg  first then can go to 20mg , let us know if you like it.  Can try 0.5 Xanax with the lunesta if this does not help Call if none of this helps.   Use a dropper to put olive oil or canola oil in the effected ear- 2-3 times a week. Let it soak for 20-30 min then you can take a shower or use a baby bulb with warm water to wash out the ear wax.  Do not use Qtips  Please cut the lipitor in half to try to decrease cramps.  Please elevate your feet to decrease swelling.   Preventative Care for Adults - Female      MAINTAIN REGULAR HEALTH EXAMS:  A routine yearly physical is a good way to check in with your primary care provider about your health and preventive screening. It is also an opportunity to share updates about your health and any concerns you have, and receive a thorough all-over exam.   Most health insurance companies pay for at least some preventative services.  Check with your health plan for specific coverages.  WHAT PREVENTATIVE SERVICES DO WOMEN NEED?  Adult women should have their weight and blood pressure checked regularly.   Women age 35 and older should have their cholesterol levels checked regularly.  Women should be screened for cervical cancer with a Pap smear and pelvic exam beginning at either age 7, or 3 years after they become sexually activity.    Breast cancer screening generally begins at age 21 with a mammogram and breast exam by your primary care provider.    Beginning at age 81 and continuing to age 57, women should be screened for colorectal cancer.  Certain people may need continued testing until age 32.  Updating vaccinations is part of preventative care.  Vaccinations help protect against diseases such as the flu.  Osteoporosis is a disease in which the bones lose minerals and strength as we age. Women ages 70 and over should discuss this with their caregivers, as should women after menopause who have other  risk factors.  Lab tests are generally done as part of preventative care to screen for anemia and blood disorders, to screen for problems with the kidneys and liver, to screen for bladder problems, to check blood sugar, and to check your cholesterol level.  Preventative services generally include counseling about diet, exercise, avoiding tobacco, drugs, excessive alcohol consumption, and sexually transmitted infections.    GENERAL RECOMMENDATIONS FOR GOOD HEALTH:  Healthy diet:  Eat a variety of foods, including fruit, vegetables, animal or vegetable protein, such as meat, fish, chicken, and eggs, or beans, lentils, tofu, and grains, such as rice.  Drink plenty of water daily.  Decrease saturated fat in the diet, avoid lots of red meat, processed foods, sweets, fast foods, and fried foods.  Exercise:  Aerobic exercise helps maintain good heart health. At least 30-40 minutes of moderate-intensity exercise is recommended. For example, a brisk walk that increases your heart rate and breathing. This should be done on most days of the week.   Find a type of exercise or a variety of exercises that you enjoy so that it becomes a part of your daily life.  Examples are running, walking, swimming, water aerobics, and biking.  For motivation and support, explore group exercise such as aerobic class, spin class, Zumba, Yoga,or  martial arts, etc.    Set exercise goals for yourself, such as a certain weight  goal, walk or run in a race such as a 5k walk/run.  Speak to your primary care provider about exercise goals.  Disease prevention:  If you smoke or chew tobacco, find out from your caregiver how to quit. It can literally save your life, no matter how long you have been a tobacco user. If you do not use tobacco, never begin.   Maintain a healthy diet and normal weight. Increased weight leads to problems with blood pressure and diabetes.   The Body Mass Index or BMI is a way of measuring how much  of your body is fat. Having a BMI above 27 increases the risk of heart disease, diabetes, hypertension, stroke and other problems related to obesity. Your caregiver can help determine your BMI and based on it develop an exercise and dietary program to help you achieve or maintain this important measurement at a healthful level.  High blood pressure causes heart and blood vessel problems.  Persistent high blood pressure should be treated with medicine if weight loss and exercise do not work.   Fat and cholesterol leaves deposits in your arteries that can block them. This causes heart disease and vessel disease elsewhere in your body.  If your cholesterol is found to be high, or if you have heart disease or certain other medical conditions, then you may need to have your cholesterol monitored frequently and be treated with medication.   Ask if you should have a cardiac stress test if your history suggests this. A stress test is a test done on a treadmill that looks for heart disease. This test can find disease prior to there being a problem.  Menopause can be associated with physical symptoms and risks. Hormone replacement therapy is available to decrease these. You should talk to your caregiver about whether starting or continuing to take hormones is right for you.   Osteoporosis is a disease in which the bones lose minerals and strength as we age. This can result in serious bone fractures. Risk of osteoporosis can be identified using a bone density scan. Women ages 71 and over should discuss this with their caregivers, as should women after menopause who have other risk factors. Ask your caregiver whether you should be taking a calcium supplement and Vitamin D, to reduce the rate of osteoporosis.   Avoid drinking alcohol in excess (more than two drinks per day).  Avoid use of street drugs. Do not share needles with anyone. Ask for professional help if you need assistance or instructions on stopping the  use of alcohol, cigarettes, and/or drugs.  Brush your teeth twice a day with fluoride toothpaste, and floss once a day. Good oral hygiene prevents tooth decay and gum disease. The problems can be painful, unattractive, and can cause other health problems. Visit your dentist for a routine oral and dental check up and preventive care every 6-12 months.   Look at your skin regularly.  Use a mirror to look at your back. Notify your caregivers of changes in moles, especially if there are changes in shapes, colors, a size larger than a pencil eraser, an irregular border, or development of new moles.  Safety:  Use seatbelts 100% of the time, whether driving or as a passenger.  Use safety devices such as hearing protection if you work in environments with loud noise or significant background noise.  Use safety glasses when doing any work that could send debris in to the eyes.  Use a helmet if you ride a bike  or motorcycle.  Use appropriate safety gear for contact sports.  Talk to your caregiver about gun safety.  Use sunscreen with a SPF (or skin protection factor) of 15 or greater.  Lighter skinned people are at a greater risk of skin cancer. Don't forget to also wear sunglasses in order to protect your eyes from too much damaging sunlight. Damaging sunlight can accelerate cataract formation.   Practice safe sex. Use condoms. Condoms are used for birth control and to help reduce the spread of sexually transmitted infections (or STIs).  Some of the STIs are gonorrhea (the clap), chlamydia, syphilis, trichomonas, herpes, HPV (human papilloma virus) and HIV (human immunodeficiency virus) which causes AIDS. The herpes, HIV and HPV are viral illnesses that have no cure. These can result in disability, cancer and death.   Keep carbon monoxide and smoke detectors in your home functioning at all times. Change the batteries every 6 months or use a model that plugs into the wall.   Vaccinations:  Stay up to date  with your tetanus shots and other required immunizations. You should have a booster for tetanus every 10 years. Be sure to get your flu shot every year, since 5%-20% of the [place] population comes down with the flu. The flu vaccine changes each year, so being vaccinated once is not enough. Get your shot in the fall, before the flu season peaks.   Other vaccines to consider:  Human Papilloma Virus or HPV causes cancer of the cervix, and other infections that can be transmitted from person to person. There is a vaccine for HPV, and females should get immunized between the ages of 83 and 67. It requires a series of 3 shots.   Pneumococcal vaccine to protect against certain types of pneumonia.  This is normally recommended for adults age 23 or older.  However, adults younger than 62 years old with certain underlying conditions such as diabetes, heart or lung disease should also receive the vaccine.  Shingles vaccine to protect against Varicella Zoster if you are older than age 11, or younger than 62 years old with certain underlying illness.  Hepatitis A vaccine to protect against a form of infection of the liver by a virus acquired from food.  Hepatitis B vaccine to protect against a form of infection of the liver by a virus acquired from blood or body fluids, particularly if you work in health care.  If you plan to travel internationally, check with your local health department for specific vaccination recommendations.  Cancer Screening:  Breast cancer screening is essential to preventive care for women. All women age 32 and older should perform a breast self-exam every month. At age 61 and older, women should have their caregiver complete a breast exam each year. Women at ages 86 and older should have a mammogram (x-ray film) of the breasts. Your caregiver can discuss how often you need mammograms.    Cervical cancer screening includes taking a Pap smear (sample of cells examined under a microscope)  from the cervix (end of the uterus). It also includes testing for HPV (Human Papilloma Virus, which can cause cervical cancer). Screening and a pelvic exam should begin at age 55, or 3 years after a woman becomes sexually active. Screening should occur every year, with a Pap smear but no HPV testing, up to age 73. After age 63, you should have a Pap smear every 3 years with HPV testing, if no HPV was found previously.   Most routine colon cancer screening  begins at the age of 44. On a yearly basis, doctors may provide special easy to use take-home tests to check for hidden blood in the stool. Sigmoidoscopy or colonoscopy can detect the earliest forms of colon cancer and is life saving. These tests use a small camera at the end of a tube to directly examine the colon. Speak to your caregiver about this at age 92, when routine screening begins (and is repeated every 5 years unless early forms of pre-cancerous polyps or small growths are found).

## 2014-02-17 NOTE — Progress Notes (Signed)
MEDICARE ANNUAL WELLNESS VISIT AND FOLLOW UP  Assessment:   1. Hypertension - CBC with Differential - BASIC METABOLIC PANEL WITH GFR - Hepatic function panel  2. HYPOTHYROIDISM - TSH  3. 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 - Hemoglobin A1c - Insulin, fasting - HM DIABETES FOOT EXAM  4. Gastroesophageal reflux disease with esophagitis Sent in meds  5. Encounter for long-term (current) use of other medications - Magnesium  6. Hyperlipidemia - Lipid panel  7. MULTIPLE  MYELOMA Continue follow up  8. Numbness and tingling Secondary to chemo, continue meds  9. Vitamin D Deficiency - Vit D  25 hydroxy (rtn osteoporosis monitoring)  10. Abdominal pain, chronic, epigastric Likely secondary to gastritis from chronic NSAID/prednisone use, sent in protonix.  - Amylase  11. Myalgia and myositis - CK  12. Obesity with co morbidities - long discussion about weight loss, diet, and exercise    Plan:   During the course of the visit the patient was educated and counseled about appropriate screening and preventive services including:    Pneumococcal vaccine   Influenza vaccine  Td vaccine  Screening electrocardiogram  Screening mammography  Bone densitometry screening  Colorectal cancer screening  Diabetes screening  Glaucoma screening  Nutrition counseling   Advanced directives: given info/requested  Screening recommendations, referrals:  Vaccinations: Tdap vaccine not indicated Influenza vaccine not indicated Pneumococcal vaccine not indicated Shingles vaccine declined Hep B vaccine not indicated  Nutrition assessed and recommended  Colonoscopy requested Mammogram requested Pap smear not indicated Pelvic exam not  indicated Recommended yearly ophthalmology/optometry visit for glaucoma screening and checkup Recommended yearly dental visit for hygiene and checkup Advanced directives - requested  Conditions/risks identified: BMI: Discussed weight loss, diet, and increase physical activity.  Increase physical activity: AHA recommends 150 minutes of physical activity a week.  Medications reviewed DEXA- requested Diabetes is at goal, ACE/ARB therapy: Yes. Urinary Incontinence is not an issue: discussed non pharmacology and pharmacology options.  Fall risk: moderate- discussed PT, home fall assessment, medications.    Subjective:   [name] is a 63 y.o. female who presents for Medicare Annual Wellness Visit and 3 month follow up on hypertension, prediabetes, hyperlipidemia, vitamin D def.  Date of last medicare wellness visit is unknown.   Her blood pressure has been controlled at home, today their BP is BP: 110/70 mmHg   She does workout. She denies chest pain, shortness of breath, dizziness.  She is on cholesterol medication and denies myalgias. Her cholesterol is at goal. 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 She denies hypoglycemia , polydipsia and polyuria. Last A1C in the office was:  Lab Results  Component Value Date   HGBA1C 6.4* 01/26/2014   Patient is on Vitamin D supplement. Lab Results  Component Value Date   VD25OH 52 11/04/2013     She is on thyroid medication. Her medication was not changed last visit. Patient denies heat /  cold intolerance, nervousness and palpitations.  Lab Results  Component Value Date   TSH 4.380 01/26/2014  .   She has a complicated history of multiple myleoma since 2008 with multiple treatments, she continues to see oncology. Her last dose of Pomalyst was given on 01/20/2014 She has seen Dr. Doy Mince from neurology for neuropathy and DDD of cervical and lumbar spine with normal MRA/MRI of head in July, and she has an appointment in Sept.  She is doing PT at the house right now for her neck and her balance.  She has been having AB pain for several years, worse the last several months. She states it is epigastric and lower AB. She was on just protonix and dexilant, she was taken off the protonix in Jan/Feb and it has gotten worse. She is on Decadron daily for her MM. She has some nausea, denies black stool, blood in her stool. + burping, beltching, bad taste in her mouth. Normal CT AB June this year, last EGD 2012 with Dr. Ardis Hughs showed gastritis.  She has had bilateral feet swelling with the chemo  Names of Other Physician/Practitioners you currently use: 1. [facility]- here for primary care 2. Dr. Venetia Maxon, eye doctor, last visit 1 month ago 3. unknown, dentist, last visit 1 year ago Patient Care Team: Unk Pinto, MD as PCP - General (Internal Medicine) Curt Bears, MD as Consulting Physician (Oncology) Lelon Perla, MD as Consulting Physician (Cardiology) Wonda Horner, MD as Consulting Physician (Gastroenterology) Marybelle Killings, MD as Consulting Physician (Orthopedic Surgery) Marye Round, MD as Consulting Physician (Radiation Oncology) Arnoldo Hooker (Hematology and Oncology)  Medication Review Current Outpatient Prescriptions on File Prior to Visit  Medication Sig Dispense Refill  . ALPRAZolam (XANAX) 0.5 MG tablet Take 0.5 mg by mouth 3 (three) times daily as needed for anxiety.      Marland Kitchen atorvastatin (LIPITOR) 40 MG tablet Take 40 mg by mouth every evening.      Marland Kitchen atorvastatin (LIPITOR) 40 MG tablet TAKE 1 TABLET (40 MG TOTAL) BY MOUTH  DAILY.  30 tablet  3  . atorvastatin (LIPITOR) 40 MG tablet TAKE 1 TABLET (40 MG TOTAL) BY MOUTH DAILY.  30 tablet  3  . celecoxib (CELEBREX) 200 MG capsule Take 200 mg by mouth daily as needed for mild pain.      Marland Kitchen dexamethasone (DECADRON) 4 MG tablet Take 40 mg by mouth every Friday.       Marland Kitchen dexlansoprazole (DEXILANT) 60 MG capsule Take 60 mg by mouth every morning.      . Eszopiclone 3 MG TABS Take 3 mg by mouth at bedtime as needed (sleep). Take immediately before bedtime      . furosemide (LASIX) 40 MG tablet TAKE 1 TABLET BY MOUTH TWICE A DAY FOR FLUID AND SWELLING  60 tablet  10  . gabapentin (NEURONTIN) 600 MG tablet Take 600 mg by mouth 4 (four) times daily as needed (pain).      Marland Kitchen HYDROcodone-acetaminophen (NORCO) 7.5-325 MG per tablet Take 1 tablet by mouth every 6 (six) hours as needed for moderate pain.  30 tablet  0  . KLOR-CON M10 10 MEQ tablet TAKE 1 TABLET BY MOUTH TWICE A DAY  60 tablet  3  . levothyroxine (SYNTHROID, LEVOTHROID) 100 MCG tablet Take 50-100 mcg by mouth daily before breakfast. Take 0.5 tab (59mcg) on Monday, Wednesday, and Friday. Take 1 tab (136mcg) on Sunday, Tuesday, Thursday, and Saturday.      Marland Kitchen  magnesium gluconate (MAGONATE) 500 MG tablet Take 500 mg by mouth every morning.      Marland Kitchen morphine (MS CONTIN) 15 MG 12 hr tablet Take 1 tablet (15 mg total) by mouth every 12 (twelve) hours.  60 tablet  0  . Multiple Vitamin (MULITIVITAMIN WITH MINERALS) TABS Take 1 tablet by mouth every morning.       . ondansetron (ZOFRAN-ODT) 4 MG disintegrating tablet PLACE 1 TABLET ON THE TONGUE EVERY 8 HOURS AS NEEDED FOR NAUSEA  60 tablet  1  . pomalidomide (POMALYST) 4 MG capsule Take 1 capsule (4 mg total) by mouth daily. Take with water on days 1-21. Repeat every 28 days.  21 capsule  0  . potassium chloride (K-DUR) 10 MEQ tablet Take 10 mEq by mouth 2 (two) times daily.      . promethazine-dextromethorphan (PROMETHAZINE-DM) 6.25-15 MG/5ML syrup TAKE 5 MLS BY MOUTH 4 (FOUR)  TIMES DAILY AS NEEDED FOR COUGH.  180 mL  0  . sennosides-docusate sodium (SENOKOT-S) 8.6-50 MG tablet Take 1 tablet by mouth daily.       No current facility-administered medications on file prior to visit.    Current Problems (verified) Patient Active Problem List   Diagnosis Date Noted  . Numbness and tingling 01/27/2014  . Encounter for long-term (current) use of other medications 11/04/2013  . Vitamin D Deficiency 11/04/2013  . T2 NIDDM w/Stage 3 CKD (GFR 51 ml/min) 11/04/2013  . HCAP (healthcare-associated pneumonia) 05/01/2013  . Hyperlipidemia 02/24/2013  . Hx of radiation therapy   . History of autologous stem cell transplant   . MULTIPLE  MYELOMA 01/12/2010  . HYPOTHYROIDISM 01/12/2010  . GERD 01/12/2010  . Hypertension 01/12/2010    Screening Tests Health Maintenance  Topic Date Due  . Pneumococcal Polysaccharide Vaccine (##1) 07[birth year]02/1953  . Foot Exam  07/02/1961  . Ophthalmology Exam  07/02/1961  . Pap Smear  07/02/1969  . Tetanus/tdap  07/02/1970  . Zostavax  07/03/2011  . Influenza Vaccine  01/29/2014  . Urine Microalbumin  07/22/2014  . Hemoglobin A1c  07/29/2014  . Mammogram  11/17/2015  . Colonoscopy  05/14/2022    Preventative care: Last colonoscopy: 2013 due 10 years EGD- Dr. Oletta Lamas 2012 + gastritis Last mammogram: 10/2013 Last pap smear/pelvic exam: declines  DEXA:Wants to wait until next year Cardiolite 2010 normal Swallow study- 01/2014 liquid diet Echo 2015 LVH, EF 60-65% 2015 US carotid normal Sleep study normal   Prior vaccinations: TD or Tdap:2010 Influenza: declines Pneumococcal: 2010 Shingles/Zostavax: declines due to chemo  History reviewed: allergies, current medications, past family history, past medical history, past social history, past surgical history and problem list  Risk Factors: Osteoporosis: postmenopausal estrogen deficiency and dietary calcium and/or vitamin D deficiency History of fracture in the past year:  no  Tobacco History  Substance Use Topics  . Smoking status: Former Smoker -- 0.25 packs/day for 6 years    Types: Cigarettes    Quit date: 08/27/1978  . Smokeless tobacco: Never Used  . Alcohol Use: Yes     Comment: "quit drinking in the 1980's"   She does not smoke.  Patient is a former smoker. Are there smokers in your home (other than you)?  No  Alcohol Current alcohol use: none  Caffeine Current caffeine use: coffee 1 /day  Exercise Current exercise: no regular exercise  Nutrition/Diet Current diet: in general, a "healthy" diet    Cardiac risk factors: dyslipidemia, hypertension and sedentary lifestyle.  Depression Screen (Note: if answer to either of  the following is "Yes", a more complete depression screening is indicated)   Q1: Over the past two weeks, have you felt down, depressed or hopeless? No  Q2: Over the past two weeks, have you felt little interest or pleasure in doing things? No  Have you lost interest or pleasure in daily life? No  Do you often feel hopeless? No  Do you cry easily over simple problems? No  Activities of Daily Living In your present state of health, do you have any difficulty performing the following activities?:  Driving? No Managing money?  No Feeding yourself? No Getting from bed to chair? No Climbing a flight of stairs? No Preparing food and eating?: No Bathing or showering? No Getting dressed: No Getting to the toilet? No Using the toilet:No Moving around from place to place: Yes In the past year have you fallen or had a near fall?:Yes no fracture   Are you sexually active?  No  Do you have more than one partner?  No  Vision Difficulties: No  Hearing Difficulties: No Do you often ask people to speak up or repeat themselves? No Do you experience ringing or noises in your ears? No Do you have difficulty understanding soft or whispered voices? No  Cognition  Do you feel that you have a problem with memory?yes  Do you  often misplace items? No  Do you feel safe at home?  Yes  Advanced directives Does patient have a [place]? Yes Does patient have a Living Will? Yes   Objective:   Blood pressure 110/70, pulse 80, temperature 98.1 F (36.7 C), resp. rate 16, height 5\' 4"  (1.626 m), weight 196 lb (88.905 kg). Body mass index is 33.63 kg/(m^2).  General appearance: alert, no distress, WD/WN,  female Cognitive Testing  Alert? Yes  Normal Appearance?Yes  Oriented to person? Yes  Place? Yes   Time? Yes  Recall of three objects?  Yes  Can perform simple calculations? Yes  Displays appropriate judgment?Yes  Can read the correct time from a watch face?Yes  HEENT: normocephalic, sclerae anicteric, TMs pearly, nares patent, no discharge or erythema, pharynx normal Oral cavity: MMM, no lesions Neck: supple, no lymphadenopathy, no thyromegaly, no masses Heart: RRR, normal S1, S2, no murmurs Lungs: CTA bilaterally, no wheezes, rhonchi, or rales Abdomen: +bs, soft, epigastric tenderness, non distended, no masses, no hepatomegaly, no splenomegaly Musculoskeletal: nontender, no swelling, no obvious deformity Extremities: 1+ bilateral edema, no cyanosis, no clubbing Pulses: 2+ symmetric, upper and lower extremities, normal cap refill Neurological: alert, oriented x 3, CN2-12 intact, strength normal upper extremities and lower extremities, sensation normal throughout, DTRs 2+ throughout, no cerebellar signs, gait normal Psychiatric: normal affect, behavior normal, pleasant  Breast: defer Gyn: defer Rectal: defer  Medicare Attestation I have personally reviewed: The patient's medical and social history Their use of alcohol, tobacco or illicit drugs Their current medications and supplements The patient's functional ability including ADLs,fall risks, home safety risks, cognitive, and hearing and visual impairment Diet and physical activities Evidence for depression or mood  disorders  The patient's weight, height, BMI, and visual acuity have been recorded in the chart.  I have made referrals, counseling, and provided education to the patient based on review of the above and I have provided the patient with a written personalized care plan for preventive services.     Vicie Mutters, [name]   02/15/2014

## 2014-02-17 NOTE — Telephone Encounter (Signed)
[name] from [facility] called and requested 1 more speech therapy visit for next week.  OK per Dr Melford Aase.

## 2014-02-18 LAB — CK: Total CK: 30 U/L (ref 7–177)

## 2014-02-18 LAB — BASIC METABOLIC PANEL WITH GFR
BUN: 9 mg/dL (ref 6–23)
CALCIUM: 8.7 mg/dL (ref 8.4–10.5)
CO2: 25 mEq/L (ref 19–32)
Chloride: 106 mEq/L (ref 96–112)
Creat: 0.79 mg/dL (ref 0.50–1.10)
GFR, Est Non African American: 80 mL/min
Glucose, Bld: 115 mg/dL — ABNORMAL HIGH (ref 70–99)
Potassium: 4 mEq/L (ref 3.5–5.3)
Sodium: 141 mEq/L (ref 135–145)

## 2014-02-18 LAB — LIPID PANEL
CHOLESTEROL: 116 mg/dL (ref 0–200)
HDL: 52 mg/dL (ref >39)
LDL Cholesterol: 32 mg/dL (ref 0–99)
Total CHOL/HDL Ratio: 2.2 Ratio
Triglycerides: 158 mg/dL — ABNORMAL HIGH (ref <150)
VLDL: 32 mg/dL (ref 0–40)

## 2014-02-18 LAB — HEPATIC FUNCTION PANEL
ALBUMIN: 3.7 g/dL (ref 3.5–5.2)
ALT: 12 U/L (ref 0–35)
AST: 11 U/L (ref 0–37)
Alkaline Phosphatase: 91 U/L (ref 39–117)
Bilirubin, Direct: 0.3 mg/dL (ref 0.0–0.3)
Indirect Bilirubin: 0.8 mg/dL (ref 0.2–1.2)
TOTAL PROTEIN: 6.6 g/dL (ref 6.0–8.3)
Total Bilirubin: 1.1 mg/dL (ref 0.2–1.2)

## 2014-02-18 LAB — VITAMIN D 25 HYDROXY (VIT D DEFICIENCY, FRACTURES): VIT D 25 HYDROXY: 48 ng/mL (ref 30–89)

## 2014-02-18 LAB — TSH: TSH: 2.213 u[IU]/mL (ref 0.350–4.500)

## 2014-02-18 LAB — INSULIN, FASTING: Insulin fasting, serum: 149 u[IU]/mL — ABNORMAL HIGH (ref 3–28)

## 2014-02-18 LAB — AMYLASE: Amylase: 43 U/L (ref 0–105)

## 2014-02-18 LAB — MAGNESIUM: Magnesium: 1.8 mg/dL (ref 1.5–2.5)

## 2014-02-18 NOTE — Patient Instructions (Signed)
Continue your Pomalyst and dexamethasone at the current doses Follow up in 2 weeks for another symptom management visit and your Zometa infusion

## 2014-02-28 ENCOUNTER — Other Ambulatory Visit: Payer: Self-pay | Admitting: *Deleted

## 2014-02-28 NOTE — Telephone Encounter (Signed)
THIS REFILL REQUEST FOR POMALYST WAS GIVEN TO DR.MOHAMED'S NURSE, STEPHANIE JOHNSON,RN.

## 2014-03-01 ENCOUNTER — Other Ambulatory Visit: Payer: Self-pay | Admitting: Medical Oncology

## 2014-03-01 DIAGNOSIS — C9000 Multiple myeloma not having achieved remission: Secondary | ICD-10-CM

## 2014-03-01 MED ORDER — POMALIDOMIDE 4 MG PO CAPS: 4.0000 mg | ORAL_CAPSULE | Freq: Every day | ORAL | Status: DC

## 2014-03-01 NOTE — Telephone Encounter (Signed)
Refill sent.

## 2014-03-02 ENCOUNTER — Encounter: Payer: Self-pay | Admitting: Gastroenterology

## 2014-03-03 ENCOUNTER — Encounter: Payer: Medicare Other | Admitting: Nutrition

## 2014-03-03 ENCOUNTER — Ambulatory Visit: Payer: Medicare Other

## 2014-03-04 ENCOUNTER — Other Ambulatory Visit (HOSPITAL_BASED_OUTPATIENT_CLINIC_OR_DEPARTMENT_OTHER): Payer: Medicare Other

## 2014-03-04 ENCOUNTER — Ambulatory Visit: Payer: Medicare Other | Admitting: Nutrition

## 2014-03-04 ENCOUNTER — Encounter: Payer: Self-pay | Admitting: Physician Assistant

## 2014-03-04 ENCOUNTER — Ambulatory Visit (HOSPITAL_BASED_OUTPATIENT_CLINIC_OR_DEPARTMENT_OTHER): Payer: Medicare Other | Admitting: Physician Assistant

## 2014-03-04 ENCOUNTER — Ambulatory Visit (HOSPITAL_BASED_OUTPATIENT_CLINIC_OR_DEPARTMENT_OTHER): Payer: Medicare Other

## 2014-03-04 ENCOUNTER — Telehealth: Payer: Self-pay | Admitting: Internal Medicine

## 2014-03-04 VITALS — BP 127/71 | HR 72 | Temp 97.3°F | Resp 18 | Ht 64.0 in | Wt 193.4 lb

## 2014-03-04 DIAGNOSIS — G609 Hereditary and idiopathic neuropathy, unspecified: Secondary | ICD-10-CM

## 2014-03-04 DIAGNOSIS — C9000 Multiple myeloma not having achieved remission: Secondary | ICD-10-CM

## 2014-03-04 DIAGNOSIS — Z9484 Stem cells transplant status: Secondary | ICD-10-CM

## 2014-03-04 LAB — CBC WITH DIFFERENTIAL/PLATELET
BASO%: 0.8 % (ref 0.0–2.0)
Basophils Absolute: 0 10*3/uL (ref 0.0–0.1)
EOS%: 3.3 % (ref 0.0–7.0)
Eosinophils Absolute: 0.1 10*3/uL (ref 0.0–0.5)
HEMATOCRIT: 34 % — AB (ref 34.8–46.6)
HGB: 11 g/dL — ABNORMAL LOW (ref 11.6–15.9)
LYMPH#: 0.7 10*3/uL — AB (ref 0.9–3.3)
LYMPH%: 22.8 % (ref 14.0–49.7)
MCH: 32.7 pg (ref 25.1–34.0)
MCHC: 32.3 g/dL (ref 31.5–36.0)
MCV: 101.2 fL — ABNORMAL HIGH (ref 79.5–101.0)
MONO#: 0.3 10*3/uL (ref 0.1–0.9)
MONO%: 12.1 % (ref 0.0–14.0)
NEUT#: 1.8 10*3/uL (ref 1.5–6.5)
NEUT%: 61 % (ref 38.4–76.8)
Platelets: 266 10*3/uL (ref 145–400)
RBC: 3.36 10*6/uL — ABNORMAL LOW (ref 3.70–5.45)
RDW: 18.8 % — AB (ref 11.2–14.5)
WBC: 2.9 10*3/uL — AB (ref 3.9–10.3)

## 2014-03-04 LAB — COMPREHENSIVE METABOLIC PANEL (CC13)
ALBUMIN: 3.2 g/dL — AB (ref 3.5–5.0)
ALT: 17 U/L (ref 0–55)
ANION GAP: 8 meq/L (ref 3–11)
AST: 13 U/L (ref 5–34)
Alkaline Phosphatase: 85 U/L (ref 40–150)
BUN: 14.7 mg/dL (ref 7.0–26.0)
CALCIUM: 9.1 mg/dL (ref 8.4–10.4)
CHLORIDE: 109 meq/L (ref 98–109)
CO2: 24 mEq/L (ref 22–29)
Creatinine: 0.8 mg/dL (ref 0.6–1.1)
GLUCOSE: 111 mg/dL (ref 70–140)
POTASSIUM: 3.7 meq/L (ref 3.5–5.1)
Sodium: 141 mEq/L (ref 136–145)
Total Bilirubin: 0.86 mg/dL (ref 0.20–1.20)
Total Protein: 6.4 g/dL (ref 6.4–8.3)

## 2014-03-04 LAB — LACTATE DEHYDROGENASE (CC13): LDH: 166 U/L (ref 125–245)

## 2014-03-04 MED ORDER — SODIUM CHLORIDE 0.9 % IJ SOLN
10.0000 mL | INTRAMUSCULAR | Status: DC | PRN
  Administered 2014-03-04: 10 mL
  Filled 2014-03-04: qty 10

## 2014-03-04 MED ORDER — ZOLEDRONIC ACID 4 MG/100ML IV SOLN
4.0000 mg | Freq: Once | INTRAVENOUS | Status: AC
  Administered 2014-03-04: 4 mg via INTRAVENOUS
  Filled 2014-03-04: qty 100

## 2014-03-04 MED ORDER — SODIUM CHLORIDE 0.9 % IV SOLN: Freq: Once | INTRAVENOUS | Status: DC

## 2014-03-04 MED ORDER — HEPARIN SOD (PORK) LOCK FLUSH 100 UNIT/ML IV SOLN
500.0000 [IU] | Freq: Once | INTRAVENOUS | Status: AC | PRN
  Administered 2014-03-04: 500 [IU]
  Filled 2014-03-04: qty 5

## 2014-03-04 NOTE — Assessment & Plan Note (Signed)
Patient is a pleasant 63 year old African American female with multiple myeloma treated with several regimens and status post autologous peripheral blood stem cell transplant. She is currently being treated with Pomalyst and dexamethasone and is tolerating this regimen relatively well. She also is treated with Zometa for bone directed treatment once every 3 months, due for treatment today. She will continue on her current treatment at the current doses. She will followup for another symptom management visit in one month

## 2014-03-04 NOTE — Patient Instructions (Signed)
Continue your Pomalyst and dexamethasone as prescribed Followup in one month

## 2014-03-04 NOTE — Telephone Encounter (Signed)
Pt's rx has been shipped

## 2014-03-04 NOTE — Telephone Encounter (Signed)
gv adn printed appt sched and avs for pt for Sept and OCT....sed added tx.

## 2014-03-04 NOTE — Progress Notes (Signed)
63 year old female diagnosed with multiple myeloma.  History includes chemoradiation therapy and stem cell transplant.  Past medical history also includes hypercholesterolemia, hypothyroidism, GERD, and stroke.  Medications include Xanax, Lipitor, Celebrex, Decadron, Lasix, Synthroid, multivitamin, Zofran, and protonix.  Labs include glucose of 115.  Height: 64 inches. Weight: 193.4 pounds. Usual body weight: 200 pounds. BMI: 33.18.  Patient identified to be at risk for malnutrition secondary to greater than 2 pound weight loss and poor appetite.  Patient reports early satiety, taste alterations, and nausea and vomiting as well as poor appetite.  Patient tries to eat small amounts throughout the day.  She is able to verbalize foods with protein in them.  She has figured out how to improve taste of foods so that she can consume a better intake.  Nutrition diagnosis: Unintended weight loss related to multiple myeloma and associated treatments as evidenced by 3% weight loss from usual body weight.  Intervention: Patient educated on strategies for high-calorie, high-protein food intake. Patient also educated on strategies for improving nausea and vomiting. Patient provided education and fact sheet on strategies of how to eat if she has nausea, vomiting. Fact sheets provided.  Questions answered.  Teach back method used.  Monitoring, evaluation, goals: Patient will tolerate adequate calories and protein to promote weight maintenance.  Next visit: Patient will contact me by phone for further questions.  **Disclaimer: This note was dictated with voice recognition software. Similar sounding words can inadvertently be transcribed and this note may contain transcription errors which may not have been corrected upon publication of note.**

## 2014-03-04 NOTE — Progress Notes (Addendum)
[facility] [phone]) [phone]   [phone]  SHARED VISIT PROGRESS NOTE  [name], [address] [place] [address]  DIAGNOSIS: Recurrent multiple myeloma initially diagnosed in October 2008.   PRIOR THERAPY:  1. Status post palliative radiotherapy to the lumbar spine between L3 and L5. The patient received a total dose of 3000 cGy in 10 fractions under the care of Dr. Lisbeth Renshaw between May 05, 2007 through May 18, 2007. 2. Status post 5 cycles of systemic chemotherapy with Revlimid and low-dose Decadron with good response to this treatment. 3. Status post autologous peripheral blood stem cell transplant at [facility] on Nov 20, 2007 under the care of Dr. Valarie Merino. 4. Status post treatment for disease recurrence with Velcade, Doxil and Decadron. Last dose given Nov 09, 2009. Discontinued secondary to intolerance but the patient had a good response to treatment at that time. 5. Status post palliative radiotherapy to the T2-T6 thoracic vertebrae completed 03/21/2011 under the care of Dr. Lisbeth Renshaw. 6. Systemic chemotherapy with Velcade 1.3 mg per meter squared given on days 1, 4, 8 and 11, and Doxil at 30 mg per meter squared given on day 4 in addition to Decadron status post 4 cycles, discontinued secondary to intolerance. 7. Systemic therapy with Velcade 1.3 mg/M2 subcutaneously in addition to Decadron 40 mg by mouth on a weekly basis, status post 20 cycles. The patient had good response with this treatment but it is discontinue today secondary to worsening peripheral neuropathy. 8. Palliative radiotherapy to the skull lesion as well as the left hip area under the care of Dr. Lisbeth Renshaw. 9. Systemic chemotherapy with Carfilzomib 20 mg/M2 on days 1, 2,  8, 9, 13 and 16 every 4 weeks in addition to weekly Decadron 40 mg by mouth. First dose on 04/19/2013. Status post 4 cycles, discontinued recently secondary to cardiac  dysfunction.  CURRENT THERAPY:  1. Pomalyst 4 mg by mouth daily for 21 days every 4 weeks in addition to dexamethasone 40 mg on a weekly basis. First dose started 12/02/2013. 2. Zometa 4 mg IV given every 3 months   INTERVAL HISTORY: [name] 63 y.o. female returns to the clinic today for follow up visit accompanied by her daughter. The patient is feeling fine today except for generalized fatigue as well as the lower back pain and the neuropathy. She also complains of spontaneous bruising primarily on the legs. She states they're not painful. She also continues to complain of debilitating cramps that occur daily. She will start her next  cycle of Pomalyst  on 03/07/2014. She denied having any significant fatigue or weakness. The patient denied having any significant chest pain, shortness of breath, or hemoptysis. She has no nausea or vomiting. She has no weight loss or night sweats. She continues to have mild peripheral neuropathy.   MEDICAL HISTORY: Past Medical History  Diagnosis Date  . Hypercholesterolemia   . Compression fracture 04/08/2007    pathologic compression fracture  . Hypothyroidism   . FHx: chemotherapy     s/p 5 cycle revlimid/low dose decadron,s/p velcade,doxil,decadron,  . Hx of radiation therapy 05/05/07-05/18/07,& 03/05/11-03/21/11-    l3&l5 in 2008, t2-t6 03/2011  . GERD (gastroesophageal reflux disease)   . Insomnia     associated with steroids  . Nausea   . Constipation     takes oxycontin,vicodin  . Hx of radiation therapy 05/05/2007 to 05/18/2007    palliative, L3-5  . Hx of radiation therapy  03/05/2011 to 03/21/2011    palliative T2-T6, c-spine  . 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 PONV (postoperative nausea and vomiting)   . Multiple myeloma dx'd 2009  . Metastasis to bone   . Family history of anesthesia complication     "daughter gets PONV too"  . Pneumonia     "several times"  . Stroke 2014    denies  residual on 01/27/2014    ALLERGIES:  has No Known Allergies.  MEDICATIONS:  Current Outpatient Prescriptions  Medication Sig Dispense Refill  . ALPRAZolam (XANAX) 0.5 MG tablet Take 0.5 mg by mouth 3 (three) times daily as needed for anxiety.      Marland Kitchen atorvastatin (LIPITOR) 40 MG tablet TAKE 1 TABLET (40 MG TOTAL) BY MOUTH DAILY.  30 tablet  3  . celecoxib (CELEBREX) 200 MG capsule Take 200 mg by mouth daily as needed for mild pain.      Marland Kitchen dexamethasone (DECADRON) 4 MG tablet Take 40 mg by mouth every Friday.       Marland Kitchen dexlansoprazole (DEXILANT) 60 MG capsule Take 60 mg by mouth every morning.      . Eszopiclone 3 MG TABS Take 3 mg by mouth at bedtime as needed (sleep). Take immediately before bedtime      . furosemide (LASIX) 40 MG tablet TAKE 1 TABLET BY MOUTH TWICE A DAY FOR FLUID AND SWELLING  60 tablet  10  . gabapentin (NEURONTIN) 600 MG tablet Take 600 mg by mouth 4 (four) times daily as needed (pain).      Marland Kitchen HYDROcodone-acetaminophen (NORCO) 7.5-325 MG per tablet Take 1 tablet by mouth every 6 (six) hours as needed for moderate pain.  30 tablet  0  . KLOR-CON M10 10 MEQ tablet TAKE 1 TABLET BY MOUTH TWICE A DAY  60 tablet  3  . levothyroxine (SYNTHROID, LEVOTHROID) 100 MCG tablet Take 50-100 mcg by mouth daily before breakfast. Take 0.5 tab (79mg) on Monday, Wednesday, and Friday. Take 1 tab (1069m) on Sunday, Tuesday, Thursday, and Saturday.      . magnesium gluconate (MAGONATE) 500 MG tablet Take 500 mg by mouth every morning.      . Marland Kitchenorphine (MS CONTIN) 15 MG 12 hr tablet Take 1 tablet (15 mg total) by mouth every 12 (twelve) hours.  60 tablet  0  . Multiple Vitamin (MULITIVITAMIN WITH MINERALS) TABS Take 1 tablet by mouth every morning.       . ondansetron (ZOFRAN-ODT) 4 MG disintegrating tablet PLACE 1 TABLET ON THE TONGUE EVERY 8 HOURS AS NEEDED FOR NAUSEA  60 tablet  1  . pantoprazole (PROTONIX) 40 MG tablet Take 1 tablet (40 mg total) by mouth at bedtime.  90 tablet  3  .  pomalidomide (POMALYST) 4 MG capsule Take 1 capsule (4 mg total) by mouth daily. Take with water on days 1-21. Repeat every 28 days.  21 capsule  0  . promethazine-dextromethorphan (PROMETHAZINE-DM) 6.25-15 MG/5ML syrup TAKE 5 MLS BY MOUTH 4 (FOUR) TIMES DAILY AS NEEDED FOR COUGH.  180 mL  0  . sennosides-docusate sodium (SENOKOT-S) 8.6-50 MG tablet Take 1 tablet by mouth daily.       No current facility-administered medications for this visit.    SURGICAL HISTORY:  Past Surgical History  Procedure Laterality Date  . Knee arthroscopy Right     "put pin in"  . Knee arthroscopy Right     "took pin out and corrected what was  wrong"  . Video bronchoscopy  07/30/2011    Procedure: VIDEO BRONCHOSCOPY WITHOUT FLUORO;  Surgeon: Kathee Delton, MD;  Location: [facility];  Service: Cardiopulmonary;  Laterality: Bilateral;  . Cholecystectomy  1980's  . Portacath placement Right 2009  . Vaginal hysterectomy  1980's    REVIEW OF SYSTEMS:  A comprehensive review of systems was negative except for: Constitutional: positive for fatigue Gastrointestinal: positive for abdominal pain Integument/breast: positive for non-painful bruising primarilly on the legs, mainly the thigh region Musculoskeletal: positive for back pain Neurological: positive for paresthesia   PHYSICAL EXAMINATION: General appearance: alert, cooperative, fatigued and no distress Head: Normocephalic, without obvious abnormality, atraumatic Neck: no adenopathy, no JVD, supple, symmetrical, trachea midline and thyroid not enlarged, symmetric, no tenderness/mass/nodules Lymph nodes: Cervical, supraclavicular, and axillary nodes normal. Resp: clear to auscultation bilaterally Back: symmetric, no curvature. ROM normal. No CVA tenderness. Cardio: regular rate and rhythm, S1, S2 normal, no murmur, click, rub or gallop GI: soft, non-tender; bowel sounds normal; no masses,  no organomegaly Extremities: extremities normal, atraumatic, no  cyanosis or edema Neurologic: Alert and oriented X 3, normal strength and tone. Normal symmetric reflexes. Normal coordination and gait Skin: Examination of the patient's anterior and posterior thighs revealed small areas of resolving ecchymosis. There are no fresh areas although beneath some there is some nodularity. There is no tenderness  ECOG PERFORMANCE STATUS: 1 - Symptomatic but completely ambulatory  Blood pressure 127/71, pulse 72, temperature 97.3 F (36.3 C), temperature source Oral, resp. rate 18, height 5' 4"  (1.626 m), weight 193 lb 6.4 oz (87.726 kg), SpO2 100.00%.  LABORATORY DATA: Lab Results  Component Value Date   WBC 2.9* 03/04/2014   HGB 11.0* 03/04/2014   HCT 34.0* 03/04/2014   MCV 101.2* 03/04/2014   PLT 266 03/04/2014      Chemistry      Component Value Date/Time   NA 141 03/04/2014 0920   NA 141 02/17/2014 1025   K 3.7 03/04/2014 0920   K 4.0 02/17/2014 1025   CL 106 02/17/2014 1025   CL 105 12/17/2012 1015   CO2 24 03/04/2014 0920   CO2 25 02/17/2014 1025   BUN 14.7 03/04/2014 0920   BUN 9 02/17/2014 1025   CREATININE 0.8 03/04/2014 0920   CREATININE 0.79 02/17/2014 1025   CREATININE 0.79 02/08/2014 1030      Component Value Date/Time   CALCIUM 9.1 03/04/2014 0920   CALCIUM 8.7 02/17/2014 1025   ALKPHOS 85 03/04/2014 0920   ALKPHOS 91 02/17/2014 1025   AST 13 03/04/2014 0920   AST 11 02/17/2014 1025   ALT 17 03/04/2014 0920   ALT 12 02/17/2014 1025   BILITOT 0.86 03/04/2014 0920   BILITOT 1.1 02/17/2014 1025     RADIOGRAPHIC STUDIES:  ASSESSMENT AND PLAN:  This is a very pleasant 63 years old [place] American female with recurrent multiple myeloma recently completed a course of treatment with Velcade and Decadron with improvement in her disease but this was discontinued secondary to peripheral neuropathy. The patient was tolerating her treatment with Carfilzomib and Decadron fairly well except for the recent shortness breath and cough which was felt to be secondary to congestive  heart failure from her treatment with Carfilzomib. She was started on treatment with Pomalyst and Decadron status post 3 cycles. She is tolerating this treatment without significant difficulty. Her recent myeloma panel revealed some improvement in her disease. The patient was discussed with and also seen by Dr. Julien Nordmann. She'll proceed with cycle #4  of Pomalyst as prescribed.  She is to follow up with her primary care physician and/or gastroenterologist regarding her stomach pain. She will follow up in 1 month for another symptom management visit. She is due for and will receive her every 3 months Zometa infusion today.  She was advised to call immediately if she has any concerning symptoms in the interval. The patient voices understanding of current disease status and treatment options and is in agreement with the current care plan.  All questions were answered. The patient knows to call the clinic with any problems, questions or concerns. We can certainly see the patient much sooner if necessary.  MULTIPLE  MYELOMA Patient is a pleasant 63 year old African American female with multiple myeloma treated with several regimens and status post autologous peripheral blood stem cell transplant. She is currently being treated with Pomalyst and dexamethasone and is tolerating this regimen relatively well. She also is treated with Zometa for bone directed treatment once every 3 months, due for treatment today. She will continue on her current treatment at the current doses. She will followup for another symptom management visit in one month   Disclaimer: This note was dictated with voice recognition software. Similar sounding words can inadvertently be transcribed and may be missed upon review. Carlton Adam, [name] 03/04/2014  ADDENDUM: Hematology/Oncology Attending: I had the face to face encounter with the patient. I recommended her care plan.The patient is feeling fine with no specific complaints.  She is tolerating her current treatment with Pomalyst and dexamethasone fairly well. I recommended for her to proceed with her treatment as scheduled. She will continue Zometa every 3 months. The patient would come back for followup visit in one month's for reevaluation before starting the next cycle of her treatment. She was advised to call immediately if she has any concerning symptoms in the interval.  Disclaimer: This note was dictated with voice recognition software. Similar sounding words can inadvertently be transcribed and may be missed upon review. Eilleen Kempf., MD 03/05/2014

## 2014-03-04 NOTE — Patient Instructions (Signed)

## 2014-03-15 ENCOUNTER — Other Ambulatory Visit: Payer: Self-pay | Admitting: Physician Assistant

## 2014-03-16 ENCOUNTER — Ambulatory Visit (INDEPENDENT_AMBULATORY_CARE_PROVIDER_SITE_OTHER): Payer: Medicare Other | Admitting: Internal Medicine

## 2014-03-16 ENCOUNTER — Encounter: Payer: Self-pay | Admitting: Internal Medicine

## 2014-03-16 VITALS — BP 106/62 | HR 80 | Temp 98.2°F | Resp 16 | Ht 64.75 in | Wt 195.4 lb

## 2014-03-16 DIAGNOSIS — R109 Unspecified abdominal pain: Secondary | ICD-10-CM

## 2014-03-16 MED ORDER — HYOSCYAMINE SULFATE 0.125 MG PO TABS: ORAL_TABLET | ORAL | Status: DC

## 2014-03-16 NOTE — Patient Instructions (Signed)

## 2014-03-16 NOTE — Progress Notes (Signed)
   Subjective:    Patient ID: [name], female    DOB: Oct 12, 1950, 63 y.o.   MRN: [id number]  HPI  P:atient presents with a several month hx/o upper abdominal bloating/pressure sensation and growling from her "stomach" after eating or drinking. No consistent response or correlation with food ingestion.  States she's had several negative w/u including CTscans and MRI scans. She is on maintenance Morphine and prn Vicodin.  Meds/All/PMH - rfeviewed  Review of Systems - non contributory to above  Objective:   Physical Exam  BP 106/62  Pulse 80  Temp 98.2 F   Resp 16  Ht 5' 4.75"   Wt 195 lb 6.4 oz   BMI 32.75   HEENT - Eac's patent. TM's Nl. EOM's full. PERRLA.  NasoOroPharynx clear. Neck - supple. Nl Thyroid. Carotids 2+ & No bruits, nodes, JVD Chest - Clear equal BS w/o Rales, rhonchi, wheezes. Cor - Nl HS. RRR w/o sig MGR. PP 1(+). No edema. Abd - No palpable organomegaly, masses or tenderness. BS nl. MS- FROM w/o deformities. Muscle power, tone and bulk Nl. Gait Nl. Neuro - No obvious Cr N abnormalities. Sensory, motor and Cerebellar functions appear Nl w/o focal abnormalities. Psyche - Mental status normal & appropriate.  No delusions, ideations or obvious mood abnormalities.  Assessment & Plan:   1. (Query) Abdominal pain, unspecified site   - hyoscyamine (LEVSIN) 0.125 MG tablet; Take 1 to 2 tablets 4 x day or every 4 hours if needed for Abdominal Pains  Dispense: 90 tablet; Refill: 99 - Ambulatory referral to Gastroenterology

## 2014-03-18 ENCOUNTER — Telehealth: Payer: Self-pay | Admitting: Gastroenterology

## 2014-03-18 ENCOUNTER — Other Ambulatory Visit (HOSPITAL_BASED_OUTPATIENT_CLINIC_OR_DEPARTMENT_OTHER): Payer: Medicare Other

## 2014-03-18 DIAGNOSIS — C9000 Multiple myeloma not having achieved remission: Secondary | ICD-10-CM

## 2014-03-18 LAB — CBC WITH DIFFERENTIAL/PLATELET
BASO%: 0.6 % (ref 0.0–2.0)
BASOS ABS: 0 10*3/uL (ref 0.0–0.1)
EOS%: 10.1 % — ABNORMAL HIGH (ref 0.0–7.0)
Eosinophils Absolute: 0.3 10*3/uL (ref 0.0–0.5)
HCT: 32.5 % — ABNORMAL LOW (ref 34.8–46.6)
HEMOGLOBIN: 10.5 g/dL — AB (ref 11.6–15.9)
LYMPH#: 0.8 10*3/uL — AB (ref 0.9–3.3)
LYMPH%: 25.6 % (ref 14.0–49.7)
MCH: 32.1 pg (ref 25.1–34.0)
MCHC: 32.3 g/dL (ref 31.5–36.0)
MCV: 99.4 fL (ref 79.5–101.0)
MONO#: 0.3 10*3/uL (ref 0.1–0.9)
MONO%: 8.5 % (ref 0.0–14.0)
NEUT#: 1.8 10*3/uL (ref 1.5–6.5)
NEUT%: 55.2 % (ref 38.4–76.8)
PLATELETS: 234 10*3/uL (ref 145–400)
RBC: 3.27 10*6/uL — ABNORMAL LOW (ref 3.70–5.45)
RDW: 16.9 % — ABNORMAL HIGH (ref 11.2–14.5)
WBC: 3.3 10*3/uL — ABNORMAL LOW (ref 3.9–10.3)

## 2014-03-18 LAB — COMPREHENSIVE METABOLIC PANEL (CC13)
ALT: 14 U/L (ref 0–55)
ANION GAP: 9 meq/L (ref 3–11)
AST: 11 U/L (ref 5–34)
Albumin: 3.1 g/dL — ABNORMAL LOW (ref 3.5–5.0)
Alkaline Phosphatase: 99 U/L (ref 40–150)
BUN: 14.6 mg/dL (ref 7.0–26.0)
CALCIUM: 9.2 mg/dL (ref 8.4–10.4)
CHLORIDE: 105 meq/L (ref 98–109)
CO2: 24 meq/L (ref 22–29)
CREATININE: 1.2 mg/dL — AB (ref 0.6–1.1)
Glucose: 110 mg/dl (ref 70–140)
Potassium: 3.8 mEq/L (ref 3.5–5.1)
Sodium: 138 mEq/L (ref 136–145)
Total Bilirubin: 0.88 mg/dL (ref 0.20–1.20)
Total Protein: 6.6 g/dL (ref 6.4–8.3)

## 2014-03-18 NOTE — Telephone Encounter (Signed)
Pt scheduled to see Tye Savoy NP 03/23/14@3pm . Pt states she has been having problems with abdominal pain. Pt aware of appt.

## 2014-03-21 ENCOUNTER — Encounter: Payer: Self-pay | Admitting: Neurology

## 2014-03-21 ENCOUNTER — Ambulatory Visit (INDEPENDENT_AMBULATORY_CARE_PROVIDER_SITE_OTHER): Payer: Medicare Other | Admitting: Neurology

## 2014-03-21 VITALS — BP 110/60 | HR 73 | Ht 64.0 in | Wt 195.1 lb

## 2014-03-21 DIAGNOSIS — T451X5A Adverse effect of antineoplastic and immunosuppressive drugs, initial encounter: Secondary | ICD-10-CM

## 2014-03-21 DIAGNOSIS — R200 Anesthesia of skin: Secondary | ICD-10-CM

## 2014-03-21 DIAGNOSIS — R202 Paresthesia of skin: Principal | ICD-10-CM

## 2014-03-21 DIAGNOSIS — M542 Cervicalgia: Secondary | ICD-10-CM

## 2014-03-21 DIAGNOSIS — R209 Unspecified disturbances of skin sensation: Secondary | ICD-10-CM

## 2014-03-21 DIAGNOSIS — G622 Polyneuropathy due to other toxic agents: Secondary | ICD-10-CM

## 2014-03-21 DIAGNOSIS — R252 Cramp and spasm: Secondary | ICD-10-CM

## 2014-03-21 DIAGNOSIS — M4802 Spinal stenosis, cervical region: Secondary | ICD-10-CM

## 2014-03-21 DIAGNOSIS — G620 Drug-induced polyneuropathy: Secondary | ICD-10-CM

## 2014-03-21 MED ORDER — BACLOFEN 10 MG PO TABS: ORAL_TABLET | ORAL | Status: DC

## 2014-03-21 NOTE — Progress Notes (Signed)
[facility] Clinic Note - Initial Visit   Date: 03/21/2014  [name] MRN: [id number] DOB: 1951-04-08   Dear Dr. Melford Aase:  Thank you for your kind referral of [name] for consultation of left arm paresthesias. Although her history is well known to you, please allow [name] to reiterate it for the purpose of our medical record. The patient was accompanied to the clinic by daughter who also provides collateral information.     History of Present Illness: [name] is a 63 y.o. right-handed African American female with history of recurrent multiple myeloma (diagnosed 2008) undergoing chemotherapy and radiation, pre-diabetes, hyperlipidemia, hypothyroidism, GERD, left frontal stroke (2014, no residual symptoms) and neuropathy presenting for evaluation of left sided neck pain and tingling of the arm.    She reports having left sided neck pain since summer 2014, which was intermittent but worsened in July 2015.  It is improved if she supports her neck and worse with neck pain.  Pain is described as squeezing pain.  She has associated tingling of the arm. On January 27, 2014, she presented to ED with paresthesias of the left arm, axilla, neck, and tongue.  She has chronic paresthesias of the hands and feet from neuropathy, but these symptoms were different such that it involved greater area.  She also had weakness of the right leg which lasted 25-30 minutes, but it slowly improved.  MRI/A brain did not show any acute stroke, but stable appearance of large boney lesion involving the right parietal skull with extension to the underlying dura measuring 3.9 x 1.1 cm. Small lesions in the left parietal bone are also unchanged. Lesion in the C2 vertebral body is also grossly unchanged. Imaging of the cervical spine shows multilevel foraminal narrowing, worse at C5-6 and C6-7 on the left.  She was recommended to follow-up with neurology.  She has been having  ongoing problem with numbness/tingling of the hands and feet, worse on the left since starting chemotherapy in 2011 with Velcade.  Her chemotherapy was stopped, but there was no improvement of paresthesias.  She is taking neurontin 664m four times daily which helps alleviate the pain.  Heat and epson salt soaks also helps her discomfort.  Paresthesias mostly involve her lower legs.  She has not been falling, but does report to dropping things.   She also complains of generalized muscle cramps.  Flexeril makes her too sleepy.  She is taking Magnesium 5054mdaily.   She initially was diagnosed in November 2008 for which she completed chemotherapy with Revlimid and also had stem cell transplant at [facility] In 2011, she was treated with Velcade, Doxil, and decadron with good response, but Velcade was discontinued due to worsening peripheral neuropathy.  In 2014, she was started on chemotherapy with Carfilzomib but this was stopped due to cardiac dysfunction.   Most recently in June 2015, Pomalyst and Zometa was started.   She takes a number of pain medications including MS contin 157mID and Norco 7.5mg71mroughout the day as needed.    Out-side paper records, electronic medical record, and images have been reviewed where available and summarized as:  MRI/A brain 01/26/2014: 1. No evidence of acute intracranial abnormality. Mild chronic white matter changes.  2. Unchanged skull lesions, consistent with multiple myeloma, including a large right parietal lesion with involvement of the underlying dura.  3. No evidence of major intracranial arterial occlusion or proximal stenosis.  MRI cervical spine 03/21/2014: 1. Left central and foraminal  disease at C5-6 and C6-7 results in moderate left lateral recess and foraminal stenosis at both levels. There may be some progression at C6-7. There is no significant change at C5-6.  2. Mild right foraminal narrowing at C5-6 and C6-7.  3. Stable mild left foraminal  narrowing at C4-5.  4. Mild facet hypertrophy at C3-4 without significant stenosis.   MRI lumbar spine 01/11/2013: Sub-centimeter metastases within the right pedicle of L1, the vertebral body at L1, the right pedicle at L2, the central S2 segment and the left sided S2 segment. 2.5 cm metastasis anteriorly within the L2 vertebral body, at the site of the pathologic endplate Schmorl's node. No neural compression by tumor.  Degenerative changes which could be a cause of back pain. Bilateral facet arthropathy at L2-3. Lateral recess stenosis at L4-5 left worse than right. Left foraminal encroachment by small disc herniation at L2-3.    Labs 02/17/2014:  TSH 2.213, HbA1c 6.0, CK 30, vitamin B12 803, HIV NR  Past Medical History  Diagnosis Date  . Hypercholesterolemia   . Compression fracture 04/08/2007    pathologic compression fracture  . Hypothyroidism   . FHx: chemotherapy     s/p 5 cycle revlimid/low dose decadron,s/p velcade,doxil,decadron,  . Hx of radiation therapy 05/05/07-05/18/07,& 03/05/11-03/21/11-    l3&l5 in 2008, t2-t6 03/2011  . GERD (gastroesophageal reflux disease)   . Insomnia     associated with steroids  . Nausea   . Constipation     takes oxycontin,vicodin  . Hx of radiation therapy 05/05/2007 to 05/18/2007    palliative, L3-5  . Hx of radiation therapy 03/05/2011 to 03/21/2011    palliative T2-T6, c-spine  . 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 PONV (postoperative nausea and vomiting)   . Multiple myeloma dx'd 2009  . Metastasis to bone   . Family history of anesthesia complication     "daughter gets PONV too"  . Pneumonia     "several times"  . Stroke 2014    denies residual on 01/27/2014    Past Surgical History  Procedure Laterality Date  . Knee arthroscopy Right     "put pin in"  . Knee arthroscopy Right     "took pin out and corrected what was wrong"  . Video bronchoscopy  07/30/2011    Procedure: VIDEO BRONCHOSCOPY  WITHOUT FLUORO;  Surgeon: Kathee Delton, MD;  Location: [facility];  Service: Cardiopulmonary;  Laterality: Bilateral;  . Cholecystectomy  1980's  . Portacath placement Right 2009  . Vaginal hysterectomy  1980's     Medications:  Current Outpatient Prescriptions on File Prior to Visit  Medication Sig Dispense Refill  . ALPRAZolam (XANAX) 0.5 MG tablet Take 0.5 mg by mouth 3 (three) times daily as needed for anxiety.      Marland Kitchen atorvastatin (LIPITOR) 40 MG tablet TAKE 1 TABLET (40 MG TOTAL) BY MOUTH DAILY.  30 tablet  3  . celecoxib (CELEBREX) 200 MG capsule Take 200 mg by mouth daily as needed for mild pain.      Marland Kitchen dexamethasone (DECADRON) 4 MG tablet Take 40 mg by mouth every Friday.       Marland Kitchen DEXILANT 60 MG capsule TAKE 1 CAPSULE (60 MG TOTAL) BY MOUTH DAILY.  30 capsule  6  . dexlansoprazole (DEXILANT) 60 MG capsule Take 60 mg by mouth every morning.      . Eszopiclone 3 MG TABS Take 3 mg by mouth at bedtime as needed (  sleep). Take immediately before bedtime      . furosemide (LASIX) 40 MG tablet TAKE 1 TABLET BY MOUTH TWICE A DAY FOR FLUID AND SWELLING  60 tablet  10  . gabapentin (NEURONTIN) 600 MG tablet Take 600 mg by mouth 4 (four) times daily as needed (pain).      Marland Kitchen HYDROcodone-acetaminophen (NORCO) 7.5-325 MG per tablet Take 1 tablet by mouth every 6 (six) hours as needed for moderate pain.  30 tablet  0  . hyoscyamine (LEVSIN) 0.125 MG tablet Take 1 to 2 tablets 4 x day or every 4 hours if needed for Abdominal Pains  90 tablet  99  . KLOR-CON M10 10 MEQ tablet TAKE 1 TABLET BY MOUTH TWICE A DAY  60 tablet  3  . levothyroxine (SYNTHROID, LEVOTHROID) 100 MCG tablet Take 50-100 mcg by mouth daily before breakfast. Take 0.5 tab (83mg) on Monday, Wednesday, and Friday. Take 1 tab (1065m) on Sunday, Tuesday, Thursday, and Saturday.      . magnesium gluconate (MAGONATE) 500 MG tablet Take 500 mg by mouth every morning.      . Marland Kitchenorphine (MS CONTIN) 15 MG 12 hr tablet Take 1 tablet (15 mg  total) by mouth every 12 (twelve) hours.  60 tablet  0  . Multiple Vitamin (MULITIVITAMIN WITH MINERALS) TABS Take 1 tablet by mouth every morning.       . ondansetron (ZOFRAN-ODT) 4 MG disintegrating tablet PLACE 1 TABLET ON THE TONGUE EVERY 8 HOURS AS NEEDED FOR NAUSEA  60 tablet  1  . pantoprazole (PROTONIX) 40 MG tablet Take 1 tablet (40 mg total) by mouth at bedtime.  90 tablet  3  . pomalidomide (POMALYST) 4 MG capsule Take 1 capsule (4 mg total) by mouth daily. Take with water on days 1-21. Repeat every 28 days.  21 capsule  0  . promethazine-dextromethorphan (PROMETHAZINE-DM) 6.25-15 MG/5ML syrup TAKE 5 MLS BY MOUTH 4 (FOUR) TIMES DAILY AS NEEDED FOR COUGH.  180 mL  0  . sennosides-docusate sodium (SENOKOT-S) 8.6-50 MG tablet Take 1 tablet by mouth daily.       No current facility-administered medications on file prior to visit.    Allergies: No Known Allergies  Family History: Family History  Problem Relation Age of Onset  . Cancer Brother     lung ca  . Cancer Maternal Uncle     colon  . Cancer Maternal Grandmother     breast ca    Social History: History   Social History  . Marital Status: Married    Spouse Name: N/A    Number of Children: N/A  . Years of Education: N/A   Occupational History  . Not on file.   Social History Main Topics  . Smoking status: Former Smoker -- 0.25 packs/day for 6 years    Types: Cigarettes    Quit date: 08/27/1978  . Smokeless tobacco: Never Used  . Alcohol Use: Yes     Comment: "quit drinking in the 1980's", previously drank on the weekend  . Drug Use: No  . Sexual Activity: Not Currently   Other Topics Concern  . Not on file   Social History Narrative   She lives with alone.  She has two grown children.   She is on disability, started in 2010.  Previously working at [employer]  Highest level of education:  12th grade    Review of Systems:  CONSTITUTIONAL: No fevers, chills, night sweats, or weight loss.   EYES:  No visual changes or eye pain ENT: No hearing changes.  No history of nose bleeds.   RESPIRATORY: No cough, wheezing and shortness of breath.   CARDIOVASCULAR: Negative for chest pain, and palpitations.   GI: Negative for abdominal discomfort, blood in stools or black stools.  No recent change in bowel habits.   GU:  No history of incontinence.   MUSCLOSKELETAL: +history of joint pain or swelling.  +myalgias.   SKIN: Negative for lesions, rash, and itching.   HEMATOLOGY/ONCOLOGY: Negative for prolonged bleeding, bruising easily, and swollen nodes.  +history of cancer.   ENDOCRINE: Negative for cold or heat intolerance, polydipsia or goiter.   PSYCH:  No depression or anxiety symptoms.   NEURO: As Above.   Vital Signs:  BP 110/60  Pulse 73  Ht 5' 4"  (1.626 m)  Wt 195 lb 1 oz (88.48 kg)  BMI 33.47 kg/m2  SpO2 98%   General Medical Exam:   General:  Well appearing, comfortable.   Eyes/ENT: see cranial nerve examination.   Neck: No masses appreciated.  Full range of motion without tenderness.  No carotid bruits. Respiratory:  Clear to auscultation, good air entry bilaterally.   Cardiac:  Regular rate and rhythm, no murmur.   Extremities:  Mild left leg edema Skin:  Ecchymosis over the left medial upper arm.    Neurological Exam: MENTAL STATUS including orientation to time, place, person, recent and remote memory, attention span and concentration, language, and fund of knowledge is normal.  Speech is not dysarthric.  CRANIAL NERVES: II:  No visual field defects.  Unremarkable fundi.   III-IV-VI: Pupils equal round and reactive to light.  Normal conjugate, extra-ocular eye movements in all directions of gaze.  No nystagmus.  No ptosis.   V:  Normal facial sensation.   VII:  Normal facial symmetry and movements.   VIII:  Normal hearing and vestibular function.   IX-X:  Normal palatal movement.   XI:  Normal shoulder shrug and head rotation.   XII:  Normal tongue strength and range  of motion, no deviation or fasciculation.  MOTOR:  No atrophy, fasciculations or abnormal movements.  No pronator drift.  Tone is normal.    Right Upper Extremity:    Left Upper Extremity:    Deltoid  5/5   Deltoid  5/5   Biceps  5/5   Biceps  5/5   Triceps  5/5   Triceps  5/5   Wrist extensors  5/5   Wrist extensors  5/5   Wrist flexors  5/5   Wrist flexors  5/5   Finger extensors  5/5   Finger extensors  5/5   Finger flexors  5/5   Finger flexors  5/5   Dorsal interossei  5/5   Dorsal interossei  5/5   Abductor pollicis  5/5   Abductor pollicis  5/5   Tone (Ashworth scale)  0  Tone (Ashworth scale)  0   Right Lower Extremity:    Left Lower Extremity:    Hip flexors  5/5   Hip flexors  5/5   Hip extensors  5/5   Hip extensors  5/5   Knee flexors  5/5   Knee flexors  5/5   Knee extensors  5/5   Knee extensors  5/5   Dorsiflexors  5/5   Dorsiflexors  5/5   Plantarflexors  5/5   Plantarflexors  5/5   Toe extensors  5/5   Toe extensors  5/5   Toe flexors  5/5   Toe flexors  5/5   Tone (Ashworth scale)  0  Tone (Ashworth scale)  0   MSRs:  Right                                                                 Left brachioradialis 2+  brachioradialis 2++  biceps 2+  biceps 2+  triceps 2+  triceps 2+  patellar trace  Patellar trace  ankle jerk 0  ankle jerk 0  Hoffman no  Hoffman no  plantar response down  plantar response down   SENSORY:  Vibration is reduced distal to ankles bilaterally. Light touch, pinprick, and temperature is intact throughout. Romberg's sign absent.   COORDINATION/GAIT: Normal finger-to- nose-finger and heel-to-shin.  Intact rapid alternating movements bilaterally.  Able to rise from a chair without using arms.  Gait narrow based and stable. Unsteady with tandem gait. Stressed gait is intact.    IMPRESSION: Ms. Nath is a 63 year-old female with history of multiple myeloma currently undergoing chemotherapy presenting for evaluation of left sided neck pain  and paresthesias of her hands and feet. 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  Her neck pain is most likely due to cervical foraminal stenosis as seen on her MRI and I explained that management is symptomatic with muscle relaxants, and pain medications.  If no improvement, steroid epidural injections can be considered.  She is currently doing neck PT with slight improvement and getting benefit from neurontin 635m QID.  Discussed adding alternative medication such as nortriptyline, but patient declined.  Her primary complaint is neck pain, so will add baclofen. She became very sedated with flexeril.      PLAN/RECOMMENDATIONS:  1.  Start baclofen 159mat bedtime x 1 week, then increase to one tablet twice daily for muscle cramps and neck spasms 2.  Stop taking sleeping medication while we are increasing baclofen 3.  Continue neurontin 60062mour times daily 4.  Continue neck physical therapy 5.  If symptoms worsen, will consider electrodiagnostic testing  6.  Return to clinic in 3-m34-month The duration of this appointment visit was 50 minutes of face-to-face time with the patient.  Greater than 50% of this time was spent in counseling, explanation of diagnosis, planning of further management, and coordination of care.   Thank you for allowing me to participate in patient's care.  If I can answer any additional questions, I would be pleased to do so.    Sincerely,    Jordynn Perrier K. PatePosey Pronto

## 2014-03-21 NOTE — Patient Instructions (Addendum)
1.  Start baclofen 10mg  at bedtime x 1 week, then increase to one tablet twice daily for muscle cramps and neck spasms 2.  Stop taking your sleeping medication while we are increasing your muscle relaxant 3.  Continue neurontin 600mg  four times daily 4.  Continue neck physical therapy 5.  If symptoms worsen, will consider electrodiagnostic testing  6.  Return to clinic in 51-months

## 2014-03-22 ENCOUNTER — Other Ambulatory Visit: Payer: Self-pay | Admitting: *Deleted

## 2014-03-22 MED ORDER — ONDANSETRON HCL 4 MG PO TABS: 4.0000 mg | ORAL_TABLET | Freq: Three times a day (TID) | ORAL | Status: DC | PRN

## 2014-03-23 ENCOUNTER — Encounter: Payer: Self-pay | Admitting: Nurse Practitioner

## 2014-03-23 ENCOUNTER — Ambulatory Visit (INDEPENDENT_AMBULATORY_CARE_PROVIDER_SITE_OTHER): Payer: Medicare Other | Admitting: Nurse Practitioner

## 2014-03-23 ENCOUNTER — Other Ambulatory Visit (INDEPENDENT_AMBULATORY_CARE_PROVIDER_SITE_OTHER): Payer: Medicare Other

## 2014-03-23 ENCOUNTER — Other Ambulatory Visit: Payer: Self-pay | Admitting: *Deleted

## 2014-03-23 VITALS — BP 116/64 | HR 76 | Ht 64.0 in | Wt 196.4 lb

## 2014-03-23 DIAGNOSIS — R109 Unspecified abdominal pain: Secondary | ICD-10-CM

## 2014-03-23 DIAGNOSIS — C9000 Multiple myeloma not having achieved remission: Secondary | ICD-10-CM

## 2014-03-23 LAB — AMYLASE: Amylase: 74 U/L (ref 27–131)

## 2014-03-23 LAB — LIPASE: Lipase: 35 U/L (ref 11.0–59.0)

## 2014-03-23 MED ORDER — HYOSCYAMINE SULFATE 0.125 MG SL SUBL: 0.1250 mg | SUBLINGUAL_TABLET | Freq: Four times a day (QID) | SUBLINGUAL | Status: DC | PRN

## 2014-03-23 MED ORDER — POMALIDOMIDE 4 MG PO CAPS
4.0000 mg | ORAL_CAPSULE | Freq: Every day | ORAL | Status: DC
Start: 2014-03-23 — End: 2014-04-25

## 2014-03-23 NOTE — Patient Instructions (Addendum)
Your physician has requested that you go to the basement for the following lab work before leaving today: Lipase Amylase  We have sent the following medications to your pharmacy for you to pick up at your convenience: [facility] have a follow up schedule with Tye Savoy, NP for 04-11-2014 at 9 am

## 2014-03-24 ENCOUNTER — Encounter: Payer: Self-pay | Admitting: Nurse Practitioner

## 2014-03-24 NOTE — Progress Notes (Signed)
     History of Present Illness:   Patient is a 63 year old female known remotely to Dr. Ardis Hughs for a history of adenomatous colon polyps (2010). Her last colonoscopy was November 2013, done by [facility], and normal. Patient comes in today for evaluation of abdominal pain. She has a history of chronic abdominal pain which has become progressive over the last several months. Pain is in the upper abdomen, is non-radiating and occurs randomly though eating does exacerbate the pain.  She sometimes has associated nausea, vomiting and diarrhea. Otherwise her bowels are generally normal.. Episodes of pain last anywhere from a few minutes to an hour then resolve spontaneously. Patient was seen in the emergency department late June 2014 for abdominal pain. She was leukopenic current disease possibly from chemotherapy), LFTs were unremarkable. Her lipase was mildly elevated at 115.  CT scan with contrast was unrevealing. Patient is status post remote cholecystectomy. Patient has not had any unusual weight loss. She is on chronic chemotherapy for multiple myeloma. Someone increased her PPI to twice daily several months ago but it hasn't help her symptoms.  Current Medications, Allergies, Past Medical History, Past Surgical History, Family History and Social History were reviewed in [employer] record.  Physical Exam: General: Pleasant, well developed , black female in no acute distress Head: Normocephalic and atraumatic Eyes:  sclerae anicteric, conjunctiva pink  Ears: Normal auditory acuity Lungs: Clear throughout to auscultation Heart: Regular rate and rhythm Abdomen: Soft, non distended, non-tender. No masses, no hepatomegaly. Normal bowel sounds Musculoskeletal: Symmetrical with no gross deformities  Extremities: No edema  Neurological: Alert oriented x 4, grossly nonfocal Psychological:  Alert and cooperative. Normal mood and affect  Assessment and Recommendations: 71.  63 year old female with chronic, intermittent upper abdominal pain sometimes associated with nausea, vomiting and diarrhea. Episodes becoming more frequent, lasting longer. No improvement in pain despite increasing PPI to twice daily. CT scan late June for these same symptoms was nondiagnostic. Her abdominal exam today is unremarkable. Was unremarkable labs and abdominal imaging is difficult to know what's causing these intermittent symptoms. Patient looks okay today, she's had no unusual weight loss.   Trial of sublingual Levsin.   Her lipase was mildly elevated at 115 in late June. Will recheck amylase / lipase today though CTscan in June showed no pancreatic abnormalities.   Will see how she responds to Levsin.  Since increasing PPI to BID offered no additional benefit she can decrease back to once daily. For unclear reasons patient has been taking Protonix in am and Dexilant in pm. I told her she could stick to one or the other as they work in a similar fashion.

## 2014-03-24 NOTE — Progress Notes (Signed)
I agree with the above note, plan 

## 2014-03-25 ENCOUNTER — Other Ambulatory Visit: Payer: Self-pay | Admitting: *Deleted

## 2014-03-25 NOTE — Telephone Encounter (Signed)
THIS REFILL REQUEST FOR POMALYST WAS PLACED ON DR.MOHAMED'S DESK.

## 2014-04-01 ENCOUNTER — Other Ambulatory Visit (HOSPITAL_BASED_OUTPATIENT_CLINIC_OR_DEPARTMENT_OTHER): Payer: Medicare Other

## 2014-04-01 ENCOUNTER — Ambulatory Visit (HOSPITAL_BASED_OUTPATIENT_CLINIC_OR_DEPARTMENT_OTHER): Payer: Medicare Other | Admitting: Nurse Practitioner

## 2014-04-01 ENCOUNTER — Telehealth: Payer: Self-pay | Admitting: Internal Medicine

## 2014-04-01 ENCOUNTER — Ambulatory Visit: Payer: Medicare Other

## 2014-04-01 VITALS — BP 112/69 | HR 73 | Temp 99.7°F | Resp 19 | Ht 64.0 in | Wt 190.9 lb

## 2014-04-01 DIAGNOSIS — C9000 Multiple myeloma not having achieved remission: Secondary | ICD-10-CM

## 2014-04-01 DIAGNOSIS — Z23 Encounter for immunization: Secondary | ICD-10-CM

## 2014-04-01 DIAGNOSIS — R252 Cramp and spasm: Secondary | ICD-10-CM

## 2014-04-01 LAB — COMPREHENSIVE METABOLIC PANEL (CC13)
ALT: 15 U/L (ref 0–55)
ANION GAP: 7 meq/L (ref 3–11)
AST: 12 U/L (ref 5–34)
Albumin: 3 g/dL — ABNORMAL LOW (ref 3.5–5.0)
Alkaline Phosphatase: 74 U/L (ref 40–150)
BILIRUBIN TOTAL: 1.15 mg/dL (ref 0.20–1.20)
BUN: 10.4 mg/dL (ref 7.0–26.0)
CO2: 27 meq/L (ref 22–29)
CREATININE: 1.1 mg/dL (ref 0.6–1.1)
Calcium: 8.9 mg/dL (ref 8.4–10.4)
Chloride: 105 mEq/L (ref 98–109)
Glucose: 92 mg/dl (ref 70–140)
Potassium: 3.5 mEq/L (ref 3.5–5.1)
SODIUM: 139 meq/L (ref 136–145)
Total Protein: 6.8 g/dL (ref 6.4–8.3)

## 2014-04-01 LAB — CBC WITH DIFFERENTIAL/PLATELET
BASO%: 3.2 % — ABNORMAL HIGH (ref 0.0–2.0)
Basophils Absolute: 0.1 10*3/uL (ref 0.0–0.1)
EOS%: 3.6 % (ref 0.0–7.0)
Eosinophils Absolute: 0.1 10*3/uL (ref 0.0–0.5)
HCT: 32.6 % — ABNORMAL LOW (ref 34.8–46.6)
HGB: 10.5 g/dL — ABNORMAL LOW (ref 11.6–15.9)
LYMPH%: 24.3 % (ref 14.0–49.7)
MCH: 32 pg (ref 25.1–34.0)
MCHC: 32.1 g/dL (ref 31.5–36.0)
MCV: 99.8 fL (ref 79.5–101.0)
MONO#: 0.6 10*3/uL (ref 0.1–0.9)
MONO%: 20.7 % — AB (ref 0.0–14.0)
NEUT#: 1.4 10*3/uL — ABNORMAL LOW (ref 1.5–6.5)
NEUT%: 48.2 % (ref 38.4–76.8)
PLATELETS: 266 10*3/uL (ref 145–400)
RBC: 3.27 10*6/uL — ABNORMAL LOW (ref 3.70–5.45)
RDW: 17.6 % — ABNORMAL HIGH (ref 11.2–14.5)
WBC: 2.9 10*3/uL — AB (ref 3.9–10.3)
lymph#: 0.7 10*3/uL — ABNORMAL LOW (ref 0.9–3.3)

## 2014-04-01 MED ORDER — DEXAMETHASONE 4 MG PO TABS: 40.0000 mg | ORAL_TABLET | ORAL | Status: DC

## 2014-04-01 MED ORDER — INFLUENZA VAC SPLIT QUAD 0.5 ML IM SUSY
0.5000 mL | PREFILLED_SYRINGE | Freq: Once | INTRAMUSCULAR | Status: AC
  Administered 2014-04-01: 0.5 mL via INTRAMUSCULAR
  Filled 2014-04-01: qty 0.5

## 2014-04-01 MED ORDER — MORPHINE SULFATE ER 15 MG PO TBCR: 15.0000 mg | EXTENDED_RELEASE_TABLET | Freq: Two times a day (BID) | ORAL | Status: DC

## 2014-04-01 MED ORDER — HYDROCODONE-ACETAMINOPHEN 7.5-325 MG PO TABS: 1.0000 | ORAL_TABLET | Freq: Four times a day (QID) | ORAL | Status: DC | PRN

## 2014-04-01 NOTE — Telephone Encounter (Signed)
[facility] PT APPT SCHEDULE FOR OCT. PER [facility] PT TO KEEP 10/16 LAB AND NO INF'S AT THIS TIME.

## 2014-04-01 NOTE — Progress Notes (Addendum)
  [facility] OFFICE PROGRESS NOTE   Diagnosis:  Multiple myeloma  INTERVAL HISTORY:   Ms. Blizzard returns as scheduled. She began the most recent cycle of Pomalidomide on 03/05/2014. She has mild intermittent nausea. No mouth sores. Bowel habits alternate constipation and diarrhea. She continues to note easy bruising and muscle cramps. She has intermittent shortness of breath. No cough. Appetite varies. She drinks a nutritional supplement.  Objective:  Vital signs in last 24 hours:  Blood pressure 112/69, pulse 73, temperature 99.7 F (37.6 C), temperature source Oral, resp. rate 19, height $RemoveBe'5\' 4"'FbBKEMjuu$  (1.626 m), weight 190 lb 14.4 oz (86.592 kg), SpO2 100.00%.    HEENT: No thrush or ulcers. Resp: Lungs clear bilaterally. No wheezes or rales. Cardio: Regular rate and rhythm. GI: Abdomen soft and nontender. No hepatomegaly. Vascular: No leg edema. Calves soft and nontender. Skin: No rash.    Lab Results:  Lab Results  Component Value Date   WBC 2.9* 04/01/2014   HGB 10.5* 04/01/2014   HCT 32.6* 04/01/2014   MCV 99.8 04/01/2014   PLT 266 04/01/2014   NEUTROABS 1.4* 04/01/2014    Imaging:  No results found.  Medications: I have reviewed the patient's current medications.  Assessment/Plan: 1. Multiple myeloma initially diagnosed October 2008. Treated in the past with Revlimid/dexamethasone, autologous peripheral stem cell therapy, Velcade/Doxil/dexamethasone, Velcade/dexamethasone, Carfilzomib and most recently Pomalidomide beginning 12/02/2013. 2. Cramps involving multiple muscle groups. Question etiology.  Disposition: Ms. Nordling appears stable. She will proceed with cycle 5 Pomalidomide as scheduled beginning 04/02/2014. She continues weekly dexamethasone.  She will return for a followup visit in approximately 4 weeks with repeat myeloma labs several days prior to that visit.  She receives Zometa on a 3 month schedule. Most recent Zometa infusion was given on  03/04/2014.  She will contact the office prior to her next visit with any problems.    Ned Card ANP/GNP-BC   04/01/2014  11:10 AM  ADDENDUM:  Hematology/Oncology Attending:  I had a face to face encounter with the patient. I recommended her care plan. This is a very pleasant 63 years old [place] American female with relapsed multiple myeloma currently on treatment with Pomalyst and Decadron status post 4 cycles. The patient is feeling fine and tolerating her treatment fairly well.  I recommended for her to proceed with cycle #5 today as scheduled.  She would have repeat myeloma panel before starting the next cycle of her treatment. She was advised to call immediately she has any concerning symptoms in the interval.  Disclaimer: This note was dictated with voice recognition software. Similar sounding words can inadvertently be transcribed and may be missed upon review. Eilleen Kempf., MD 04/02/2014

## 2014-04-11 ENCOUNTER — Ambulatory Visit (INDEPENDENT_AMBULATORY_CARE_PROVIDER_SITE_OTHER): Payer: Medicare Other | Admitting: Nurse Practitioner

## 2014-04-11 ENCOUNTER — Encounter: Payer: Self-pay | Admitting: Nurse Practitioner

## 2014-04-11 VITALS — BP 102/50 | HR 64 | Ht 64.0 in | Wt 193.6 lb

## 2014-04-11 DIAGNOSIS — G8929 Other chronic pain: Secondary | ICD-10-CM

## 2014-04-11 DIAGNOSIS — R1084 Generalized abdominal pain: Secondary | ICD-10-CM

## 2014-04-11 MED ORDER — HYOSCYAMINE SULFATE 0.125 MG SL SUBL: 0.1250 mg | SUBLINGUAL_TABLET | Freq: Four times a day (QID) | SUBLINGUAL | Status: DC | PRN

## 2014-04-11 NOTE — Patient Instructions (Addendum)
You have a follow up scheduled with Dr. Ardis Hughs for 06/20/2014 at 845 am If you need to cancel or reschedule please call [phone]  Please hold Magnesium   Please stop Senokot and start Miralax  Miralax can be purchased over the counter, take one capful and mix in eight ounces of juice or water once daily   New prescription for Levsin was sent to your pharmacy

## 2014-04-11 NOTE — Progress Notes (Signed)
     History of Present Illness:  Patient is a 63 year old female known remotely to Dr. Ardis Hughs for a history of adenomatous colon polyps (2010). She was followed [facility] 2012-2013.  I saw her late last month for evaluation of upper abdominal pain. Pain had been present for several months, maybe even a year or two but was becoming progressively worse.  She had been seen in ED in June for abdominal pain and nausea.  CTscan and labs were normal. (with exception of mildly elevated lipase). I repeated lipase which was normal. I tried her on Levsin which has helped. Patient is back for follow up. She continues to have episodes of pain. The non-radiating upper abdominal occurs randomly, even during the night, though eating does exacerbate the pain. Episodes last anywhere from a few minutes to an hour then resolve spontaneously. She sometimes has associated  nausea, vomiting and diarrhea. Otherwise her bowels are generally normal.. Patient is status post remote cholecystectomy. She hasn't  had any unusual weight loss. She is on chronic chemotherapy for multiple myeloma.    Current Medications, Allergies, Past Medical History, Past Surgical History, Family History and Social History were reviewed in [employer] record.   Physical Exam: General: Pleasant, well developed , black female in no acute distress Head: Normocephalic and atraumatic Eyes:  sclerae anicteric, conjunctiva pink  Ears: Normal auditory acuity Lungs: Clear throughout to auscultation Heart: Regular rate and rhythm Abdomen: Soft, non distended, non-tender. No masses, no hepatomegaly. Normal bowel sounds Musculoskeletal: Symmetrical with no gross deformities  Extremities: No edema  Neurological: Alert oriented x 4, grossly nonfocal Psychological:  Alert and cooperative. Normal mood and affect  Assessment and Recommendations:  79.  63 year old female with chronic, intermittent upper abdominal pain for at least a  year. Pain sometimes associated with nausea, vomiting and diarrhea. CTscan and labs unremarkable in June 2015. EGD by [facility] in 2012 was normal. Colonoscopy by [facility] Nov 2013 (for abdominal pain ) was normal. Her weight is stable, abdominal exam is unremarkable.  Levsin helps and I will refill rx for her. Etiology of these symptoms not clear. Patient will follow up with Dr. Ardis Hughs in a few weeks. I will see if he has any recommendations for her in the interim.   2. Multiple myeloma, on chronic chemotherapy (oral form)

## 2014-04-12 ENCOUNTER — Encounter: Payer: Self-pay | Admitting: Nurse Practitioner

## 2014-04-12 NOTE — Progress Notes (Signed)
I agree with the above note, plan.  EGD 3 years ago, colonoscopy 2 years ago, CT scan this year; all failing to show clear etiology of pains. She has bone lesions from MM, perhaps some of her abd pain is related.  No reason for further GI testing for now.

## 2014-04-13 ENCOUNTER — Telehealth: Payer: Self-pay | Admitting: *Deleted

## 2014-04-13 NOTE — Telephone Encounter (Signed)
Message copied by Hulan Saas on Wed Apr 13, 2014  8:07 AM ------      Message from: Willia Craze      Created: Tue Apr 12, 2014  4:07 PM       Mardee Postin wanted Ardis Hughs to review chart to see if he thought patient needed any further testing. He doesn't have anything else to add. Please tell patient to use Levsin 2-3 times a days as needed (caution constipation). I believe she has a follow up with Ardis Hughs in a few weeks. I will leave it to her as to whether she wants to keep appt since we aren't planning for anymore GI tests for now. Thanks      ----- Message -----         From: Milus Banister, MD         Sent: 04/12/2014   4:00 PM           To: Willia Craze, NP                        ----- Message -----         From: Willia Craze, NP         Sent: 04/12/2014   1:33 PM           To: Milus Banister, MD                   ------

## 2014-04-13 NOTE — Telephone Encounter (Signed)
Patient notified of recommendations. 

## 2014-04-15 ENCOUNTER — Other Ambulatory Visit (HOSPITAL_BASED_OUTPATIENT_CLINIC_OR_DEPARTMENT_OTHER): Payer: Medicare Other

## 2014-04-15 DIAGNOSIS — C9000 Multiple myeloma not having achieved remission: Secondary | ICD-10-CM

## 2014-04-15 LAB — CBC WITH DIFFERENTIAL/PLATELET
BASO%: 0.8 % (ref 0.0–2.0)
Basophils Absolute: 0 10*3/uL (ref 0.0–0.1)
EOS ABS: 0.3 10*3/uL (ref 0.0–0.5)
EOS%: 13.1 % — ABNORMAL HIGH (ref 0.0–7.0)
HCT: 31.5 % — ABNORMAL LOW (ref 34.8–46.6)
HGB: 10.2 g/dL — ABNORMAL LOW (ref 11.6–15.9)
LYMPH#: 0.6 10*3/uL — AB (ref 0.9–3.3)
LYMPH%: 24.2 % (ref 14.0–49.7)
MCH: 31.7 pg (ref 25.1–34.0)
MCHC: 32.4 g/dL (ref 31.5–36.0)
MCV: 97.8 fL (ref 79.5–101.0)
MONO#: 0.2 10*3/uL (ref 0.1–0.9)
MONO%: 8.3 % (ref 0.0–14.0)
NEUT%: 53.6 % (ref 38.4–76.8)
NEUTROS ABS: 1.4 10*3/uL — AB (ref 1.5–6.5)
Platelets: 230 10*3/uL (ref 145–400)
RBC: 3.22 10*6/uL — AB (ref 3.70–5.45)
RDW: 15.9 % — AB (ref 11.2–14.5)
WBC: 2.5 10*3/uL — ABNORMAL LOW (ref 3.9–10.3)

## 2014-04-15 LAB — COMPREHENSIVE METABOLIC PANEL (CC13)
ALT: 16 U/L (ref 0–55)
ANION GAP: 7 meq/L (ref 3–11)
AST: 12 U/L (ref 5–34)
Albumin: 2.8 g/dL — ABNORMAL LOW (ref 3.5–5.0)
Alkaline Phosphatase: 76 U/L (ref 40–150)
BILIRUBIN TOTAL: 1.2 mg/dL (ref 0.20–1.20)
BUN: 7.7 mg/dL (ref 7.0–26.0)
CO2: 26 meq/L (ref 22–29)
CREATININE: 0.8 mg/dL (ref 0.6–1.1)
Calcium: 9.2 mg/dL (ref 8.4–10.4)
Chloride: 106 mEq/L (ref 98–109)
Glucose: 120 mg/dl (ref 70–140)
Potassium: 3.5 mEq/L (ref 3.5–5.1)
Sodium: 139 mEq/L (ref 136–145)
TOTAL PROTEIN: 6.4 g/dL (ref 6.4–8.3)

## 2014-04-25 ENCOUNTER — Other Ambulatory Visit: Payer: Self-pay | Admitting: *Deleted

## 2014-04-25 ENCOUNTER — Other Ambulatory Visit: Payer: Self-pay | Admitting: Medical Oncology

## 2014-04-25 ENCOUNTER — Other Ambulatory Visit (HOSPITAL_BASED_OUTPATIENT_CLINIC_OR_DEPARTMENT_OTHER): Payer: Medicare Other

## 2014-04-25 DIAGNOSIS — C9000 Multiple myeloma not having achieved remission: Secondary | ICD-10-CM

## 2014-04-25 DIAGNOSIS — C9002 Multiple myeloma in relapse: Secondary | ICD-10-CM

## 2014-04-25 LAB — CBC WITH DIFFERENTIAL/PLATELET
BASO%: 0.6 % (ref 0.0–2.0)
BASOS ABS: 0 10*3/uL (ref 0.0–0.1)
EOS%: 9.8 % — ABNORMAL HIGH (ref 0.0–7.0)
Eosinophils Absolute: 0.3 10*3/uL (ref 0.0–0.5)
HCT: 33.7 % — ABNORMAL LOW (ref 34.8–46.6)
HGB: 10.7 g/dL — ABNORMAL LOW (ref 11.6–15.9)
LYMPH%: 25.2 % (ref 14.0–49.7)
MCH: 31.4 pg (ref 25.1–34.0)
MCHC: 31.6 g/dL (ref 31.5–36.0)
MCV: 99.4 fL (ref 79.5–101.0)
MONO#: 0.4 10*3/uL (ref 0.1–0.9)
MONO%: 14.9 % — AB (ref 0.0–14.0)
NEUT#: 1.4 10*3/uL — ABNORMAL LOW (ref 1.5–6.5)
NEUT%: 49.5 % (ref 38.4–76.8)
PLATELETS: 272 10*3/uL (ref 145–400)
RBC: 3.39 10*6/uL — AB (ref 3.70–5.45)
RDW: 17.3 % — ABNORMAL HIGH (ref 11.2–14.5)
WBC: 2.8 10*3/uL — ABNORMAL LOW (ref 3.9–10.3)
lymph#: 0.7 10*3/uL — ABNORMAL LOW (ref 0.9–3.3)

## 2014-04-25 LAB — COMPREHENSIVE METABOLIC PANEL (CC13)
ALK PHOS: 103 U/L (ref 40–150)
ALT: 19 U/L (ref 0–55)
AST: 13 U/L (ref 5–34)
Albumin: 3.2 g/dL — ABNORMAL LOW (ref 3.5–5.0)
Anion Gap: 8 mEq/L (ref 3–11)
BILIRUBIN TOTAL: 0.63 mg/dL (ref 0.20–1.20)
BUN: 8.8 mg/dL (ref 7.0–26.0)
CO2: 24 mEq/L (ref 22–29)
CREATININE: 0.8 mg/dL (ref 0.6–1.1)
Calcium: 9 mg/dL (ref 8.4–10.4)
Chloride: 111 mEq/L — ABNORMAL HIGH (ref 98–109)
GLUCOSE: 88 mg/dL (ref 70–140)
Potassium: 3.4 mEq/L — ABNORMAL LOW (ref 3.5–5.1)
Sodium: 142 mEq/L (ref 136–145)
Total Protein: 6.7 g/dL (ref 6.4–8.3)

## 2014-04-25 MED ORDER — POMALIDOMIDE 4 MG PO CAPS: 4.0000 mg | ORAL_CAPSULE | Freq: Every day | ORAL | Status: DC

## 2014-04-25 NOTE — Progress Notes (Signed)
Pomalyst refilled with new Josem Kaufmann

## 2014-04-25 NOTE — Telephone Encounter (Signed)
THIS REFILL REQUEST FOR POMALYST WAS GIVEN TO DR.MOHAMED'S NURSE, DIANE BELL,RN.

## 2014-04-26 ENCOUNTER — Other Ambulatory Visit: Payer: Self-pay | Admitting: *Deleted

## 2014-04-26 MED ORDER — PANTOPRAZOLE SODIUM 40 MG PO TBEC: 40.0000 mg | DELAYED_RELEASE_TABLET | Freq: Two times a day (BID) | ORAL | Status: DC

## 2014-04-27 ENCOUNTER — Other Ambulatory Visit: Payer: Self-pay | Admitting: *Deleted

## 2014-04-27 ENCOUNTER — Telehealth: Payer: Self-pay | Admitting: *Deleted

## 2014-04-27 DIAGNOSIS — C9000 Multiple myeloma not having achieved remission: Secondary | ICD-10-CM

## 2014-04-27 LAB — KAPPA/LAMBDA LIGHT CHAINS
Kappa free light chain: 1.69 mg/dL (ref 0.33–1.94)
Kappa:Lambda Ratio: 0.23 — ABNORMAL LOW (ref 0.26–1.65)
Lambda Free Lght Chn: 7.21 mg/dL — ABNORMAL HIGH (ref 0.57–2.63)

## 2014-04-27 LAB — IGG, IGA, IGM
IGG (IMMUNOGLOBIN G), SERUM: 1040 mg/dL (ref 690–1700)
IGM, SERUM: 42 mg/dL — AB (ref 52–322)
IgA: 154 mg/dL (ref 69–380)

## 2014-04-27 LAB — BETA 2 MICROGLOBULIN, SERUM: BETA 2 MICROGLOBULIN: 2.53 mg/L — AB (ref <=2.51)

## 2014-04-27 MED ORDER — POMALIDOMIDE 4 MG PO CAPS: 4.0000 mg | ORAL_CAPSULE | Freq: Every day | ORAL | Status: DC

## 2014-04-27 NOTE — Telephone Encounter (Signed)
Received fax from [facility] stating they will be in touch once they have verified insurance coverage and made delivery arrangements

## 2014-04-27 NOTE — Telephone Encounter (Signed)
Refaxed Pomalyst prescription to [facility] pharmacy-sent to [facility] in error.

## 2014-04-28 ENCOUNTER — Telehealth: Payer: Self-pay | Admitting: *Deleted

## 2014-04-28 ENCOUNTER — Telehealth: Payer: Self-pay | Admitting: Internal Medicine

## 2014-04-28 ENCOUNTER — Ambulatory Visit (HOSPITAL_BASED_OUTPATIENT_CLINIC_OR_DEPARTMENT_OTHER): Payer: Medicare Other | Admitting: Internal Medicine

## 2014-04-28 ENCOUNTER — Encounter: Payer: Self-pay | Admitting: Internal Medicine

## 2014-04-28 VITALS — BP 127/60 | HR 73 | Temp 98.4°F | Resp 18 | Ht 64.0 in | Wt 191.9 lb

## 2014-04-28 DIAGNOSIS — C9000 Multiple myeloma not having achieved remission: Secondary | ICD-10-CM

## 2014-04-28 MED ORDER — MORPHINE SULFATE ER 15 MG PO TBCR: 15.0000 mg | EXTENDED_RELEASE_TABLET | Freq: Two times a day (BID) | ORAL | Status: DC

## 2014-04-28 MED ORDER — HYDROCODONE-ACETAMINOPHEN 7.5-325 MG PO TABS: 1.0000 | ORAL_TABLET | Freq: Four times a day (QID) | ORAL | Status: DC | PRN

## 2014-04-28 NOTE — Telephone Encounter (Signed)
Per staff message and POF I have scheduled appts. Advised scheduler of appts. JMW  

## 2014-04-28 NOTE — Progress Notes (Signed)
[facility] Telephone:([phone]) [phone]   Fax:([phone]) [place], [place] [address]  DIAGNOSIS: Recurrent multiple myeloma initially diagnosed in October 2008.   PRIOR THERAPY:  1. Status post palliative radiotherapy to the lumbar spine between L3 and L5. The patient received a total dose of 3000 cGy in 10 fractions under the care of Dr. Lisbeth Renshaw between May 05, 2007 through May 18, 2007. 2. Status post 5 cycles of systemic chemotherapy with Revlimid and low-dose Decadron with good response to this treatment. 3. Status post autologous peripheral blood stem cell transplant at [facility] on Nov 20, 2007 under the care of Dr. Valarie Merino. 4. Status post treatment for disease recurrence with Velcade, Doxil and Decadron. Last dose given Nov 09, 2009. Discontinued secondary to intolerance but the patient had a good response to treatment at that time. 5. Status post palliative radiotherapy to the T2-T6 thoracic vertebrae completed 03/21/2011 under the care of Dr. Lisbeth Renshaw. 6. Systemic chemotherapy with Velcade 1.3 mg per meter squared given on days 1, 4, 8 and 11, and Doxil at 30 mg per meter squared given on day 4 in addition to Decadron status post 4 cycles, discontinued secondary to intolerance. 7. Systemic therapy with Velcade 1.3 mg/M2 subcutaneously in addition to Decadron 40 mg by mouth on a weekly basis, status post 20 cycles. The patient had good response with this treatment but it is discontinue today secondary to worsening peripheral neuropathy. 8. Palliative radiotherapy to the skull lesion as well as the left hip area under the care of Dr. Lisbeth Renshaw. 9. Systemic chemotherapy with Carfilzomib 20 mg/M2 on days 1, 2,  8, 9, 13 and 16 every 4 weeks in addition to weekly Decadron 40 mg by mouth. First dose on 04/19/2013. Status post 4 cycles, discontinued recently secondary to cardiac  dysfunction.  CURRENT THERAPY:  1. Pomalyst 4 mg by mouth daily for 21 days every 4 weeks in addition to dexamethasone 40 mg on a weekly basis. First dose started 12/02/2013. 2. Zometa 4 mg IV given every 3 months   INTERVAL HISTORY: [name] 63 y.o. female returns to the clinic today for follow up visit accompanied by her son. The patient is feeling fine today with no specific complaints except for the itching chronic back pain. She is tolerating her current treatment with Pomalyst fairly well with no significant adverse effects. She denied having any significant fatigue or weakness. The patient denied having any significant chest pain, shortness of breath, or hemoptysis. She has no nausea or vomiting. She has no weight loss or night sweats. She continues to have mild peripheral neuropathy. She had repeat myeloma panel performed recently and she is here for evaluation and discussion of her lab results.  MEDICAL HISTORY: Past Medical History  Diagnosis Date  . Hypercholesterolemia   . Compression fracture 04/08/2007    pathologic compression fracture  . Hypothyroidism   . FHx: chemotherapy     s/p 5 cycle revlimid/low dose decadron,s/p velcade,doxil,decadron,  . Hx of radiation therapy 05/05/07-05/18/07,& 03/05/11-03/21/11-    l3&l5 in 2008, t2-t6 03/2011  . GERD (gastroesophageal reflux disease)   . Insomnia     associated with steroids  . Constipation     takes oxycontin,vicodin  . Hx of radiation therapy 05/05/2007 to 05/18/2007    palliative, L3-5  . Hx of radiation therapy 03/05/2011 to 03/21/2011    palliative T2-T6, c-spine  . History of  autologous stem cell transplant 11/20/2007    [facility], Dr Valarie Merino  . PONV (postoperative nausea and vomiting)   . Multiple myeloma dx'd 2009  . Metastasis to bone   . Family history of anesthesia complication     "daughter gets PONV too"  . Pneumonia     "several times"  . Stroke 2014    denies residual on 01/27/2014    ALLERGIES:  has  No Known Allergies.  MEDICATIONS:  Current Outpatient Prescriptions  Medication Sig Dispense Refill  . ALPRAZolam (XANAX) 0.5 MG tablet Take 0.5 mg by mouth 3 (three) times daily as needed for anxiety.      Marland Kitchen atorvastatin (LIPITOR) 40 MG tablet TAKE 1 TABLET (40 MG TOTAL) BY MOUTH DAILY.  30 tablet  3  . baclofen (LIORESAL) 10 MG tablet Start taking one tablet at bedtime x 1 week, then increase to one tablet twice daily  60 each  3  . dexamethasone (DECADRON) 4 MG tablet Take 10 tablets (40 mg total) by mouth every Friday.  40 tablet  3  . DEXILANT 60 MG capsule TAKE 1 CAPSULE (60 MG TOTAL) BY MOUTH DAILY.  30 capsule  6  . Eszopiclone 3 MG TABS Take 3 mg by mouth at bedtime as needed (sleep). Take immediately before bedtime      . furosemide (LASIX) 40 MG tablet TAKE 1 TABLET BY MOUTH TWICE A DAY FOR FLUID AND SWELLING  60 tablet  10  . gabapentin (NEURONTIN) 600 MG tablet Take 600 mg by mouth 4 (four) times daily as needed (pain).      Marland Kitchen HYDROcodone-acetaminophen (NORCO) 7.5-325 MG per tablet Take 1 tablet by mouth every 6 (six) hours as needed for moderate pain.  30 tablet  0  . hyoscyamine (LEVSIN SL) 0.125 MG SL tablet Place 1 tablet (0.125 mg total) under the tongue every 6 (six) hours as needed.  30 tablet  4  . KLOR-CON M10 10 MEQ tablet TAKE 1 TABLET BY MOUTH TWICE A DAY  60 tablet  3  . levothyroxine (SYNTHROID, LEVOTHROID) 100 MCG tablet Take 50-100 mcg by mouth daily before breakfast. Take 0.5 tab (69mg) on Monday, Wednesday, and Friday. Take 1 tab (1037m) on Sunday, Tuesday, Thursday, and Saturday.      . morphine (MS CONTIN) 15 MG 12 hr tablet Take 1 tablet (15 mg total) by mouth every 12 (twelve) hours.  60 tablet  0  . Multiple Vitamin (MULITIVITAMIN WITH MINERALS) TABS Take 1 tablet by mouth every morning.       . ondansetron (ZOFRAN) 4 MG tablet Take 1 tablet (4 mg total) by mouth every 8 (eight) hours as needed for nausea or vomiting.  60 tablet  0  . pantoprazole (PROTONIX)  40 MG tablet Take 1 tablet (40 mg total) by mouth 2 (two) times daily.  60 tablet  3  . pomalidomide (POMALYST) 4 MG capsule Take 1 capsule (4 mg total) by mouth daily. Take with water on days 1-21. Repeat every 28 days.  21 capsule  0  . promethazine-dextromethorphan (PROMETHAZINE-DM) 6.25-15 MG/5ML syrup TAKE 5 MLS BY MOUTH 4 (FOUR) TIMES DAILY AS NEEDED FOR COUGH.  180 mL  0   No current facility-administered medications for this visit.    SURGICAL HISTORY:  Past Surgical History  Procedure Laterality Date  . Knee arthroscopy Right     "put pin in"  . Knee arthroscopy Right     "took pin out and corrected what was wrong"  . Video  bronchoscopy  07/30/2011    Procedure: VIDEO BRONCHOSCOPY WITHOUT FLUORO;  Surgeon: Kathee Delton, MD;  Location: [facility];  Service: Cardiopulmonary;  Laterality: Bilateral;  . Cholecystectomy  1980's  . Portacath placement Right 2009  . Vaginal hysterectomy  1980's    REVIEW OF SYSTEMS:  Constitutional: negative Eyes: negative Ears, nose, mouth, throat, and face: negative Respiratory: negative Cardiovascular: negative Gastrointestinal: negative Genitourinary:negative Integument/breast: negative Hematologic/lymphatic: negative Musculoskeletal:positive for back pain Neurological: negative Behavioral/Psych: negative   PHYSICAL EXAMINATION: General appearance: alert, cooperative, fatigued and no distress Head: Normocephalic, without obvious abnormality, atraumatic Neck: no adenopathy, no JVD, supple, symmetrical, trachea midline and thyroid not enlarged, symmetric, no tenderness/mass/nodules Lymph nodes: Cervical, supraclavicular, and axillary nodes normal. Resp: clear to auscultation bilaterally Back: symmetric, no curvature. ROM normal. No CVA tenderness. Cardio: regular rate and rhythm, S1, S2 normal, no murmur, click, rub or gallop GI: soft, non-tender; bowel sounds normal; no masses,  no organomegaly Extremities: extremities normal,  atraumatic, no cyanosis or edema Neurologic: Alert and oriented X 3, normal strength and tone. Normal symmetric reflexes. Normal coordination and gait  ECOG PERFORMANCE STATUS: 1 - Symptomatic but completely ambulatory  Blood pressure 127/60, pulse 73, temperature 98.4 F (36.9 C), temperature source Oral, resp. rate 18, height 5' 4" (1.626 m), weight 191 lb 14.4 oz (87.045 kg), SpO2 100.00%.  LABORATORY DATA: Lab Results  Component Value Date   WBC 2.8* 04/25/2014   HGB 10.7* 04/25/2014   HCT 33.7* 04/25/2014   MCV 99.4 04/25/2014   PLT 272 04/25/2014      Chemistry      Component Value Date/Time   NA 142 04/25/2014 1041   NA 141 02/17/2014 1025   K 3.4* 04/25/2014 1041   K 4.0 02/17/2014 1025   CL 106 02/17/2014 1025   CL 105 12/17/2012 1015   CO2 24 04/25/2014 1041   CO2 25 02/17/2014 1025   BUN 8.8 04/25/2014 1041   BUN 9 02/17/2014 1025   CREATININE 0.8 04/25/2014 1041   CREATININE 0.79 02/17/2014 1025   CREATININE 0.79 02/08/2014 1030      Component Value Date/Time   CALCIUM 9.0 04/25/2014 1041   CALCIUM 8.7 02/17/2014 1025   ALKPHOS 103 04/25/2014 1041   ALKPHOS 91 02/17/2014 1025   AST 13 04/25/2014 1041   AST 11 02/17/2014 1025   ALT 19 04/25/2014 1041   ALT 12 02/17/2014 1025   BILITOT 0.63 04/25/2014 1041   BILITOT 1.1 02/17/2014 1025     RADIOGRAPHIC STUDIES:  ASSESSMENT AND PLAN:  This is a very pleasant 63 years old [place] American female with recurrent multiple myeloma recently completed a course of treatment with Velcade and Decadron with improvement in her disease but this was discontinued secondary to peripheral neuropathy. The patient is tolerating her treatment with Carfilzomib and Decadron fairly well except for the recent shortness breath and cough which was felt to be secondary to congestive heart failure from her treatment with Carfilzomib. She was started on treatment with Pomalyst and Decadron status post 5 cycles. She tolerated the last cycle of her  treatment well. The recent myeloma panel showed continuous improvement in her disease. I discussed the lab result with the patient and her son. I recommended for her to continue cycle #6 of her treatment in the next few days once she received her Pomalyst. She would come back for followup visit in 4 weeks for evaluation and discussion of further treatment options based on the myeloma panel. The patient will continue her  current treatment with Zometa every 3 months as scheduled. She was advised to call immediately if she has any concerning symptoms in the interval. The patient voices understanding of current disease status and treatment options and is in agreement with the current care plan.  All questions were answered. The patient knows to call the clinic with any problems, questions or concerns. We can certainly see the patient much sooner if necessary.  Disclaimer: This note was dictated with voice recognition software. Similar sounding words can inadvertently be transcribed and may be missed upon review.

## 2014-04-28 NOTE — Telephone Encounter (Signed)
Gave avs & cal for Nov. Sent mess to sch [place].

## 2014-04-29 ENCOUNTER — Ambulatory Visit: Payer: Self-pay

## 2014-04-29 ENCOUNTER — Other Ambulatory Visit: Payer: Self-pay

## 2014-05-06 ENCOUNTER — Other Ambulatory Visit: Payer: Self-pay | Admitting: Physician Assistant

## 2014-05-09 ENCOUNTER — Other Ambulatory Visit: Payer: Self-pay | Admitting: Physician Assistant

## 2014-05-10 ENCOUNTER — Ambulatory Visit (INDEPENDENT_AMBULATORY_CARE_PROVIDER_SITE_OTHER): Payer: Medicare Other | Admitting: Physician Assistant

## 2014-05-10 ENCOUNTER — Encounter: Payer: Self-pay | Admitting: Physician Assistant

## 2014-05-10 VITALS — BP 110/62 | HR 72 | Temp 98.1°F | Resp 16 | Ht 64.0 in | Wt 197.0 lb

## 2014-05-10 DIAGNOSIS — R202 Paresthesia of skin: Secondary | ICD-10-CM

## 2014-05-10 DIAGNOSIS — E782 Mixed hyperlipidemia: Secondary | ICD-10-CM

## 2014-05-10 DIAGNOSIS — C9000 Multiple myeloma not having achieved remission: Secondary | ICD-10-CM

## 2014-05-10 DIAGNOSIS — E1122 Type 2 diabetes mellitus with diabetic chronic kidney disease: Secondary | ICD-10-CM

## 2014-05-10 DIAGNOSIS — E039 Hypothyroidism, unspecified: Secondary | ICD-10-CM

## 2014-05-10 DIAGNOSIS — N183 Chronic kidney disease, stage 3 unspecified: Secondary | ICD-10-CM

## 2014-05-10 DIAGNOSIS — G8929 Other chronic pain: Secondary | ICD-10-CM

## 2014-05-10 DIAGNOSIS — R1084 Generalized abdominal pain: Secondary | ICD-10-CM

## 2014-05-10 DIAGNOSIS — R200 Anesthesia of skin: Secondary | ICD-10-CM

## 2014-05-10 DIAGNOSIS — I10 Essential (primary) hypertension: Secondary | ICD-10-CM

## 2014-05-10 DIAGNOSIS — E559 Vitamin D deficiency, unspecified: Secondary | ICD-10-CM

## 2014-05-10 DIAGNOSIS — K2100 Gastro-esophageal reflux disease with esophagitis, without bleeding: Secondary | ICD-10-CM

## 2014-05-10 DIAGNOSIS — M791 Myalgia, unspecified site: Secondary | ICD-10-CM

## 2014-05-10 DIAGNOSIS — Z79899 Other long term (current) drug therapy: Secondary | ICD-10-CM

## 2014-05-10 LAB — BASIC METABOLIC PANEL WITH GFR
BUN: 10 mg/dL (ref 6–23)
CHLORIDE: 102 meq/L (ref 96–112)
CO2: 29 mEq/L (ref 19–32)
Calcium: 8.5 mg/dL (ref 8.4–10.5)
Creat: 0.82 mg/dL (ref 0.50–1.10)
GFR, EST AFRICAN AMERICAN: 89 mL/min
GFR, EST NON AFRICAN AMERICAN: 77 mL/min
GLUCOSE: 84 mg/dL (ref 70–99)
POTASSIUM: 3.9 meq/L (ref 3.5–5.3)
Sodium: 139 mEq/L (ref 135–145)

## 2014-05-10 LAB — LIPID PANEL
CHOL/HDL RATIO: 1.7 ratio
Cholesterol: 107 mg/dL (ref 0–200)
HDL: 62 mg/dL (ref >39)
LDL Cholesterol: 25 mg/dL (ref 0–99)
TRIGLYCERIDES: 99 mg/dL (ref <150)
VLDL: 20 mg/dL (ref 0–40)

## 2014-05-10 LAB — HEPATIC FUNCTION PANEL
ALT: 11 U/L (ref 0–35)
AST: 10 U/L (ref 0–37)
Albumin: 3.6 g/dL (ref 3.5–5.2)
Alkaline Phosphatase: 70 U/L (ref 39–117)
BILIRUBIN DIRECT: 0.3 mg/dL (ref 0.0–0.3)
BILIRUBIN INDIRECT: 0.9 mg/dL (ref 0.2–1.2)
TOTAL PROTEIN: 5.8 g/dL — AB (ref 6.0–8.3)
Total Bilirubin: 1.2 mg/dL (ref 0.2–1.2)

## 2014-05-10 LAB — CK: Total CK: 27 U/L (ref 7–177)

## 2014-05-10 LAB — MAGNESIUM: MAGNESIUM: 1.6 mg/dL (ref 1.5–2.5)

## 2014-05-10 LAB — TSH: TSH: 12.841 u[IU]/mL — ABNORMAL HIGH (ref 0.350–4.500)

## 2014-05-10 NOTE — Patient Instructions (Signed)
Stop the lipitor for 1 week to see if this helps to decrease the muscle aches/cramps you have, if this DOES help decrease cramps then go back on it EVERY OTHER DAY, if this does NOT help decrease cramps go back on it daily.    Preventative Care for Adults - Female      MAINTAIN REGULAR HEALTH EXAMS:  A routine yearly physical is a good way to check in with your primary care provider about your health and preventive screening. It is also an opportunity to share updates about your health and any concerns you have, and receive a thorough all-over exam.   Most health insurance companies pay for at least some preventative services.  Check with your health plan for specific coverages.  WHAT PREVENTATIVE SERVICES DO WOMEN NEED?  Adult women should have their weight and blood pressure checked regularly.   Women age 47 and older should have their cholesterol levels checked regularly.  Women should be screened for cervical cancer with a Pap smear and pelvic exam beginning at either age 48, or 3 years after they become sexually activity.    Breast cancer screening generally begins at age 36 with a mammogram and breast exam by your primary care provider.    Beginning at age 62 and continuing to age 5, women should be screened for colorectal cancer.  Certain people may need continued testing until age 52.  Updating vaccinations is part of preventative care.  Vaccinations help protect against diseases such as the flu.  Osteoporosis is a disease in which the bones lose minerals and strength as we age. Women ages 48 and over should discuss this with their caregivers, as should women after menopause who have other risk factors.  Lab tests are generally done as part of preventative care to screen for anemia and blood disorders, to screen for problems with the kidneys and liver, to screen for bladder problems, to check blood sugar, and to check your cholesterol level.  Preventative services generally  include counseling about diet, exercise, avoiding tobacco, drugs, excessive alcohol consumption, and sexually transmitted infections.    GENERAL RECOMMENDATIONS FOR GOOD HEALTH:  Healthy diet:  Eat a variety of foods, including fruit, vegetables, animal or vegetable protein, such as meat, fish, chicken, and eggs, or beans, lentils, tofu, and grains, such as rice.  Drink plenty of water daily.  Decrease saturated fat in the diet, avoid lots of red meat, processed foods, sweets, fast foods, and fried foods.  Exercise:  Aerobic exercise helps maintain good heart health. At least 30-40 minutes of moderate-intensity exercise is recommended. For example, a brisk walk that increases your heart rate and breathing. This should be done on most days of the week.   Find a type of exercise or a variety of exercises that you enjoy so that it becomes a part of your daily life.  Examples are running, walking, swimming, water aerobics, and biking.  For motivation and support, explore group exercise such as aerobic class, spin class, Zumba, Yoga,or  martial arts, etc.    Set exercise goals for yourself, such as a certain weight goal, walk or run in a race such as a 5k walk/run.  Speak to your primary care provider about exercise goals.  Disease prevention:  If you smoke or chew tobacco, find out from your caregiver how to quit. It can literally save your life, no matter how long you have been a tobacco user. If you do not use tobacco, never begin.   Maintain  a healthy diet and normal weight. Increased weight leads to problems with blood pressure and diabetes.   The Body Mass Index or BMI is a way of measuring how much of your body is fat. Having a BMI above 27 increases the risk of heart disease, diabetes, hypertension, stroke and other problems related to obesity. Your caregiver can help determine your BMI and based on it develop an exercise and dietary program to help you achieve or maintain this important  measurement at a healthful level.  High blood pressure causes heart and blood vessel problems.  Persistent high blood pressure should be treated with medicine if weight loss and exercise do not work.   Fat and cholesterol leaves deposits in your arteries that can block them. This causes heart disease and vessel disease elsewhere in your body.  If your cholesterol is found to be high, or if you have heart disease or certain other medical conditions, then you may need to have your cholesterol monitored frequently and be treated with medication.   Ask if you should have a cardiac stress test if your history suggests this. A stress test is a test done on a treadmill that looks for heart disease. This test can find disease prior to there being a problem.  Menopause can be associated with physical symptoms and risks. Hormone replacement therapy is available to decrease these. You should talk to your caregiver about whether starting or continuing to take hormones is right for you.   Osteoporosis is a disease in which the bones lose minerals and strength as we age. This can result in serious bone fractures. Risk of osteoporosis can be identified using a bone density scan. Women ages 6 and over should discuss this with their caregivers, as should women after menopause who have other risk factors. Ask your caregiver whether you should be taking a calcium supplement and Vitamin D, to reduce the rate of osteoporosis.   Avoid drinking alcohol in excess (more than two drinks per day).  Avoid use of street drugs. Do not share needles with anyone. Ask for professional help if you need assistance or instructions on stopping the use of alcohol, cigarettes, and/or drugs.  Brush your teeth twice a day with fluoride toothpaste, and floss once a day. Good oral hygiene prevents tooth decay and gum disease. The problems can be painful, unattractive, and can cause other health problems. Visit your dentist for a routine oral  and dental check up and preventive care every 6-12 months.   Look at your skin regularly.  Use a mirror to look at your back. Notify your caregivers of changes in moles, especially if there are changes in shapes, colors, a size larger than a pencil eraser, an irregular border, or development of new moles.  Safety:  Use seatbelts 100% of the time, whether driving or as a passenger.  Use safety devices such as hearing protection if you work in environments with loud noise or significant background noise.  Use safety glasses when doing any work that could send debris in to the eyes.  Use a helmet if you ride a bike or motorcycle.  Use appropriate safety gear for contact sports.  Talk to your caregiver about gun safety.  Use sunscreen with a SPF (or skin protection factor) of 15 or greater.  Lighter skinned people are at a greater risk of skin cancer. Don't forget to also wear sunglasses in order to protect your eyes from too much damaging sunlight. Damaging sunlight can accelerate cataract formation.  Practice safe sex. Use condoms. Condoms are used for birth control and to help reduce the spread of sexually transmitted infections (or STIs).  Some of the STIs are gonorrhea (the clap), chlamydia, syphilis, trichomonas, herpes, HPV (human papilloma virus) and HIV (human immunodeficiency virus) which causes AIDS. The herpes, HIV and HPV are viral illnesses that have no cure. These can result in disability, cancer and death.   Keep carbon monoxide and smoke detectors in your home functioning at all times. Change the batteries every 6 months or use a model that plugs into the wall.   Vaccinations:  Stay up to date with your tetanus shots and other required immunizations. You should have a booster for tetanus every 10 years. Be sure to get your flu shot every year, since 5%-20% of the [place] population comes down with the flu. The flu vaccine changes each year, so being vaccinated once is not enough. Get your  shot in the fall, before the flu season peaks.   Other vaccines to consider:  Human Papilloma Virus or HPV causes cancer of the cervix, and other infections that can be transmitted from person to person. There is a vaccine for HPV, and females should get immunized between the ages of 60 and 43. It requires a series of 3 shots.   Pneumococcal vaccine to protect against certain types of pneumonia.  This is normally recommended for adults age 33 or older.  However, adults younger than 62 years old with certain underlying conditions such as diabetes, heart or lung disease should also receive the vaccine.  Shingles vaccine to protect against Varicella Zoster if you are older than age 23, or younger than 62 years old with certain underlying illness.  Hepatitis A vaccine to protect against a form of infection of the liver by a virus acquired from food.  Hepatitis B vaccine to protect against a form of infection of the liver by a virus acquired from blood or body fluids, particularly if you work in health care.  If you plan to travel internationally, check with your local health department for specific vaccination recommendations.  Cancer Screening:  Breast cancer screening is essential to preventive care for women. All women age 32 and older should perform a breast self-exam every month. At age 4 and older, women should have their caregiver complete a breast exam each year. Women at ages 53 and older should have a mammogram (x-ray film) of the breasts. Your caregiver can discuss how often you need mammograms.    Cervical cancer screening includes taking a Pap smear (sample of cells examined under a microscope) from the cervix (end of the uterus). It also includes testing for HPV (Human Papilloma Virus, which can cause cervical cancer). Screening and a pelvic exam should begin at age 54, or 3 years after a woman becomes sexually active. Screening should occur every year, with a Pap smear but no HPV  testing, up to age 76. After age 49, you should have a Pap smear every 3 years with HPV testing, if no HPV was found previously.   Most routine colon cancer screening begins at the age of 36. On a yearly basis, doctors may provide special easy to use take-home tests to check for hidden blood in the stool. Sigmoidoscopy or colonoscopy can detect the earliest forms of colon cancer and is life saving. These tests use a small camera at the end of a tube to directly examine the colon. Speak to your caregiver about this at age 10, when  routine screening begins (and is repeated every 5 years unless early forms of pre-cancerous polyps or small growths are found).

## 2014-05-10 NOTE — Progress Notes (Signed)
Assessment and Plan:  Hypertension: Continue medication, monitor blood pressure at home. Continue DASH diet.  Reminder to go to the ER if any CP, SOB, nausea, dizziness, severe HA, changes vision/speech, left arm numbness and tingling, and jaw pain. Cholesterol: Continue diet and exercise. Check cholesterol.  Pre-diabetes-Continue diet and exercise. Check A1C Vitamin D Def- check level and continue medications.  MM- continue follow up oncology Myalgias- stop lipitor x 1 week to see if it makes a difference, if not then go back on it and likely secondary chemo Hypothyroidism-check TSH level, continue medications the same, reminded to take on an empty stomach 30-61mns before food.    Continue diet and meds as discussed. Further disposition pending results of labs.  HPI Sonya THORSTENSONis a 63y.o. female who presents for Medicare Annual Wellness Visit and 3 month follow up on hypertension, prediabetes, hyperlipidemia, vitamin D def.  Date of last medicare wellness visit is unknown.   Her blood pressure has been controlled at home, today their BP is BP: 110/62 mmHg She does not workout. She denies chest pain, shortness of breath, dizziness.  She is on cholesterol medication, lipitor and has myalgias. Her cholesterol is at goal. The cholesterol last visit was:   Lab Results  Component Value Date   CHOL 116 02/17/2014   HDL 52 02/17/2014   LDLCALC 32 02/17/2014   TRIG 158* 02/17/2014   CHOLHDL 2.2 02/17/2014   She has been working on diet and exercise for prediabetes, and denies polydipsia, polyuria and visual disturbances. Last A1C in the office was:  Lab Results  Component Value Date   HGBA1C 6.0* 02/17/2014   Patient is on Vitamin D supplement. Lab Results  Component Value Date   VD25OH 48 02/17/2014     She has had Multiple myeloma and is on chronic chemo for this, following with Dr. MJulien Nordmannfor this. She has had diarrhea since starting chemo with abdominal cramps, has see GI  for this and is trying levsin stating it helps. She has myaglias and states it is from the chemo, she is on lipitor too. History of CVA with some right hand numbness for this and has occasional difficulty with ADLs, she rarely drives.   She is on thyroid medication. Her medication was not changed last visit.  Lab Results  Component Value Date   TSH 2.213 02/17/2014  .   Current Medications:  Current Outpatient Prescriptions on File Prior to Visit  Medication Sig Dispense Refill  . ALPRAZolam (XANAX) 0.5 MG tablet TAKE 1 TABLET BY MOUTH AS NEEDED FOR ANXIETY 30 tablet 3  . atorvastatin (LIPITOR) 40 MG tablet TAKE 1 TABLET (40 MG TOTAL) BY MOUTH DAILY. 30 tablet 3  . baclofen (LIORESAL) 10 MG tablet Start taking one tablet at bedtime x 1 week, then increase to one tablet twice daily 60 each 3  . celecoxib (CELEBREX) 200 MG capsule     . dexamethasone (DECADRON) 4 MG tablet Take 10 tablets (40 mg total) by mouth every Friday. 40 tablet 3  . DEXILANT 60 MG capsule TAKE 1 CAPSULE (60 MG TOTAL) BY MOUTH DAILY. 30 capsule 6  . Eszopiclone 3 MG TABS Take 3 mg by mouth at bedtime as needed (sleep). Take immediately before bedtime    . furosemide (LASIX) 40 MG tablet TAKE 1 TABLET BY MOUTH TWICE A DAY FOR FLUID AND SWELLING 60 tablet 10  . gabapentin (NEURONTIN) 600 MG tablet Take 600 mg by mouth 4 (four) times daily as needed (pain).    .Marland Kitchen  HYDROcodone-acetaminophen (NORCO) 7.5-325 MG per tablet Take 1 tablet by mouth every 6 (six) hours as needed for moderate pain. 30 tablet 0  . hyoscyamine (LEVSIN SL) 0.125 MG SL tablet Place 1 tablet (0.125 mg total) under the tongue every 6 (six) hours as needed. 30 tablet 4  . KLOR-CON M10 10 MEQ tablet TAKE 1 TABLET BY MOUTH TWICE A DAY 60 tablet 3  . levothyroxine (SYNTHROID, LEVOTHROID) 100 MCG tablet Take 50-100 mcg by mouth daily before breakfast. Take 0.5 tab (34mg) on Monday, Wednesday, and Friday. Take 1 tab (1051m) on Sunday, Tuesday, Thursday, and  Saturday.    . morphine (MS CONTIN) 15 MG 12 hr tablet Take 1 tablet (15 mg total) by mouth every 12 (twelve) hours. 60 tablet 0  . Multiple Vitamin (MULITIVITAMIN WITH MINERALS) TABS Take 1 tablet by mouth every morning.     . ondansetron (ZOFRAN) 4 MG tablet Take 1 tablet (4 mg total) by mouth every 8 (eight) hours as needed for nausea or vomiting. 60 tablet 0  . pantoprazole (PROTONIX) 40 MG tablet Take 1 tablet (40 mg total) by mouth 2 (two) times daily. 60 tablet 3  . pomalidomide (POMALYST) 4 MG capsule Take 1 capsule (4 mg total) by mouth daily. Take with water on days 1-21. Repeat every 28 days. 21 capsule 0  . promethazine-dextromethorphan (PROMETHAZINE-DM) 6.25-15 MG/5ML syrup TAKE 5 MLS BY MOUTH 4 (FOUR) TIMES DAILY AS NEEDED FOR COUGH. 180 mL 0   No current facility-administered medications on file prior to visit.   Medical History:  Past Medical History  Diagnosis Date  . Hypercholesterolemia   . Compression fracture 04/08/2007    pathologic compression fracture  . Hypothyroidism   . FHx: chemotherapy     s/p 5 cycle revlimid/low dose decadron,s/p velcade,doxil,decadron,  . Hx of radiation therapy 05/05/07-05/18/07,& 03/05/11-03/21/11-    l3&l5 in 2008, t2-t6 03/2011  . GERD (gastroesophageal reflux disease)   . Insomnia     associated with steroids  . Constipation     takes oxycontin,vicodin  . Hx of radiation therapy 05/05/2007 to 05/18/2007    palliative, L3-5  . Hx of radiation therapy 03/05/2011 to 03/21/2011    palliative T2-T6, c-spine  . 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 PONV (postoperative nausea and vomiting)   . Multiple myeloma dx'd 2009  . Metastasis to bone   . Family history of anesthesia complication     "daughter gets PONV too"  . Pneumonia     "several times"  . Stroke 2014    denies residual on 01/27/2014   Allergies: No Known Allergies   Review of Systems: _0  = complains of  _1  = denies  General: Fatigue  _2  Fever _3  Chills _4  Weakness _5   Insomnia _6  Eyes: Redness _7  Blurred vision _8  Diplopia _9   ENT: Congestion _10  Sinus Pain _11  Post Nasal Drip _12  Sore Throat _13  Earache _14   Cardiac: Chest pain/pressure _15  SOB _16  Orthopnea _17   Palpitations _18   Paroxysmal nocturnal dyspnea_19  Claudication _20  Edema _21   Pulmonary: Cough _22  Wheezing_23   SOB _24   Snoring _25   GI: Nausea _26  Vomiting_27  Dysphagia_28  Heartburn_29  Abdominal pain _30  Constipation _31 ; Diarrhea _32 ; BRBPR _33  Melena_34  GU: Hematuria_35  Dysuria _36  Nocturia_37  Urgency _38   Hesitancy _39   Discharge _0  Neuro: Headaches_1  Vertigo_2  Paresthesias_3  Spasm _4  Speech changes _5  Incoordination _6   Ortho: Arthritis _7  Joint pain _8  Muscle pain _9  Joint swelling _10  Back Pain _11  Skin:  Rash _12   Pruritis _13  Change in skin lesion _14   Psych: Depression_15  Anxiety_16  Confusion _17  Memory loss _18   Heme/Lypmh: Bleeding _19  Bruising _20  Enlarged lymph nodes _21   Endocrine: Visual blurring _22  Paresthesia _23  Polyuria _24  Polydypsea _25    Heat/cold intolerance _26  Hypoglycemia _27   Family history- Review and unchanged Social history- Review and unchanged Physical Exam: BP 110/62 mmHg  Pulse 72  Temp(Src) 98.1 F (36.7 C)  Resp 16  Ht _28  (1.626 m)  Wt 197 lb (89.359 kg)  BMI 33.80 kg/m2 Wt Readings from Last 3 Encounters:  05/10/14 197 lb (89.359 kg)  04/28/14 191 lb 14.4 oz (87.045 kg)  04/11/14 193 lb 9.6 oz (87.816 kg)   General Appearance: Well nourished, in no apparent distress. Eyes: PERRLA, EOMs, conjunctiva no swelling or erythema Sinuses: No Frontal/maxillary tenderness ENT/Mouth: Ext aud canals clear, TMs without erythema, bulging. No erythema, swelling, or exudate on post pharynx.  Tonsils not swollen or erythematous. Hearing normal.  Neck: Supple, thyroid normal.  Respiratory: Respiratory effort normal, BS equal bilaterally without rales, rhonchi, wheezing or stridor.  Cardio: RRR with no MRGs.  Brisk peripheral pulses without edema.  Abdomen: Soft, + BS.  Non tender, no guarding, rebound, hernias, masses. Lymphatics: Non tender without lymphadenopathy.  Musculoskeletal: Full ROM, 5/5 strength, normal gait.  Skin: Warm, dry without rashes, lesions, ecchymosis.  Neuro: Cranial nerves intact. Normal muscle tone, no cerebellar symptoms. Sensation intact.  Psych: Awake and oriented X 3, normal affect, Insight and Judgment appropriate.    Vicie Mutters, [name] 1:34 PM [facility]

## 2014-05-11 LAB — CBC WITH DIFFERENTIAL/PLATELET
BASOS ABS: 0 10*3/uL (ref 0.0–0.1)
BASOS PCT: 1 % (ref 0–1)
Eosinophils Absolute: 0.4 10*3/uL (ref 0.0–0.7)
Eosinophils Relative: 10 % — ABNORMAL HIGH (ref 0–5)
HEMATOCRIT: 34.5 % — AB (ref 36.0–46.0)
HEMOGLOBIN: 10.9 g/dL — AB (ref 12.0–15.0)
LYMPHS PCT: 17 % (ref 12–46)
Lymphs Abs: 0.6 10*3/uL — ABNORMAL LOW (ref 0.7–4.0)
MCH: 31.2 pg (ref 26.0–34.0)
MCHC: 31.6 g/dL (ref 30.0–36.0)
MCV: 98.9 fL (ref 78.0–100.0)
MONO ABS: 0.3 10*3/uL (ref 0.1–1.0)
MONOS PCT: 7 % (ref 3–12)
Neutro Abs: 2.4 10*3/uL (ref 1.7–7.7)
Neutrophils Relative %: 65 % (ref 43–77)
Platelets: 247 10*3/uL (ref 150–400)
RBC: 3.49 MIL/uL — ABNORMAL LOW (ref 3.87–5.11)
RDW: 17.3 % — ABNORMAL HIGH (ref 11.5–15.5)
WBC: 3.7 10*3/uL — AB (ref 4.0–10.5)

## 2014-05-11 LAB — VITAMIN D 25 HYDROXY (VIT D DEFICIENCY, FRACTURES): VIT D 25 HYDROXY: 46 ng/mL (ref 30–89)

## 2014-05-11 LAB — HEMOGLOBIN A1C
Hgb A1c MFr Bld: 6.4 % — ABNORMAL HIGH (ref <5.7)
Mean Plasma Glucose: 137 mg/dL — ABNORMAL HIGH (ref <117)

## 2014-05-13 ENCOUNTER — Other Ambulatory Visit: Payer: Self-pay | Admitting: *Deleted

## 2014-05-13 NOTE — Telephone Encounter (Signed)
Refill request for Pomalyst 4 mg to MD desk for approval.

## 2014-05-18 ENCOUNTER — Other Ambulatory Visit: Payer: Self-pay | Admitting: Physician Assistant

## 2014-05-20 ENCOUNTER — Encounter: Payer: Self-pay | Admitting: *Deleted

## 2014-05-20 ENCOUNTER — Other Ambulatory Visit: Payer: Self-pay | Admitting: *Deleted

## 2014-05-20 NOTE — Telephone Encounter (Signed)
Refill request for pomalyst to MD desk for approval. Has repeat myeloma labs on 11/23 with office visit on 11/30.

## 2014-05-23 ENCOUNTER — Other Ambulatory Visit (HOSPITAL_BASED_OUTPATIENT_CLINIC_OR_DEPARTMENT_OTHER): Payer: Medicare Other

## 2014-05-23 DIAGNOSIS — C9000 Multiple myeloma not having achieved remission: Secondary | ICD-10-CM

## 2014-05-23 LAB — CBC WITH DIFFERENTIAL/PLATELET
BASO%: 0.6 % (ref 0.0–2.0)
BASOS ABS: 0 10*3/uL (ref 0.0–0.1)
EOS ABS: 0.3 10*3/uL (ref 0.0–0.5)
EOS%: 7.9 % — AB (ref 0.0–7.0)
HCT: 38.4 % (ref 34.8–46.6)
HEMOGLOBIN: 12 g/dL (ref 11.6–15.9)
LYMPH%: 31.9 % (ref 14.0–49.7)
MCH: 30.8 pg (ref 25.1–34.0)
MCHC: 31.3 g/dL — ABNORMAL LOW (ref 31.5–36.0)
MCV: 98.7 fL (ref 79.5–101.0)
MONO#: 0.4 10*3/uL (ref 0.1–0.9)
MONO%: 12.6 % (ref 0.0–14.0)
NEUT%: 47 % (ref 38.4–76.8)
NEUTROS ABS: 1.6 10*3/uL (ref 1.5–6.5)
PLATELETS: 267 10*3/uL (ref 145–400)
RBC: 3.89 10*6/uL (ref 3.70–5.45)
RDW: 17 % — AB (ref 11.2–14.5)
WBC: 3.4 10*3/uL — ABNORMAL LOW (ref 3.9–10.3)
lymph#: 1.1 10*3/uL (ref 0.9–3.3)

## 2014-05-23 LAB — COMPREHENSIVE METABOLIC PANEL (CC13)
ALK PHOS: 87 U/L (ref 40–150)
ALT: 16 U/L (ref 0–55)
AST: 10 U/L (ref 5–34)
Albumin: 3.7 g/dL (ref 3.5–5.0)
Anion Gap: 10 mEq/L (ref 3–11)
BILIRUBIN TOTAL: 1.22 mg/dL — AB (ref 0.20–1.20)
BUN: 11.6 mg/dL (ref 7.0–26.0)
CO2: 25 mEq/L (ref 22–29)
CREATININE: 1 mg/dL (ref 0.6–1.1)
Calcium: 10.2 mg/dL (ref 8.4–10.4)
Chloride: 109 mEq/L (ref 98–109)
GLUCOSE: 86 mg/dL (ref 70–140)
Potassium: 3.6 mEq/L (ref 3.5–5.1)
Sodium: 143 mEq/L (ref 136–145)
Total Protein: 7.1 g/dL (ref 6.4–8.3)

## 2014-05-23 LAB — LACTATE DEHYDROGENASE (CC13): LDH: 198 U/L (ref 125–245)

## 2014-05-24 ENCOUNTER — Other Ambulatory Visit: Payer: Self-pay | Admitting: *Deleted

## 2014-05-24 DIAGNOSIS — C9000 Multiple myeloma not having achieved remission: Secondary | ICD-10-CM

## 2014-05-24 MED ORDER — POMALIDOMIDE 4 MG PO CAPS: 4.0000 mg | ORAL_CAPSULE | Freq: Every day | ORAL | Status: DC

## 2014-05-24 NOTE — Telephone Encounter (Signed)
RECEIVED A FAX FROM [facility] CONCERNING A CONFIRMATION OF FACSIMILE RECEIPT FOR PT. REFERRAL. 

## 2014-05-30 ENCOUNTER — Encounter: Payer: Self-pay | Admitting: Physician Assistant

## 2014-05-30 ENCOUNTER — Ambulatory Visit (HOSPITAL_BASED_OUTPATIENT_CLINIC_OR_DEPARTMENT_OTHER): Payer: Medicare Other | Admitting: Physician Assistant

## 2014-05-30 ENCOUNTER — Other Ambulatory Visit: Payer: Self-pay

## 2014-05-30 ENCOUNTER — Telehealth: Payer: Self-pay | Admitting: Internal Medicine

## 2014-05-30 VITALS — BP 128/53 | HR 70 | Temp 98.3°F | Resp 18 | Ht 64.0 in | Wt 198.7 lb

## 2014-05-30 DIAGNOSIS — C9002 Multiple myeloma in relapse: Secondary | ICD-10-CM

## 2014-05-30 DIAGNOSIS — C9000 Multiple myeloma not having achieved remission: Secondary | ICD-10-CM

## 2014-05-30 DIAGNOSIS — I471 Supraventricular tachycardia: Secondary | ICD-10-CM

## 2014-05-30 MED ORDER — HYDROCODONE-ACETAMINOPHEN 7.5-325 MG PO TABS: 1.0000 | ORAL_TABLET | Freq: Four times a day (QID) | ORAL | Status: DC | PRN

## 2014-05-30 MED ORDER — MORPHINE SULFATE ER 15 MG PO TBCR: 15.0000 mg | EXTENDED_RELEASE_TABLET | Freq: Two times a day (BID) | ORAL | Status: DC

## 2014-05-30 NOTE — Telephone Encounter (Signed)
gv and printed appt sched and avs for pt for DEC...sed added tx.

## 2014-05-30 NOTE — Patient Instructions (Signed)
Start your next cycle of Pomalyst and Dexamethasone as prescribed Follow up in 3 weeks

## 2014-05-30 NOTE — Progress Notes (Signed)
[place] [phone]   Fax:(336) [place], [place] [address]  DIAGNOSIS: Recurrent multiple myeloma initially diagnosed in October 2008.   PRIOR THERAPY:  1. Status post palliative radiotherapy to the lumbar spine between L3 and L5. The patient received a total dose of 3000 cGy in 10 fractions under the care of Dr. Lisbeth Renshaw between May 05, 2007 through May 18, 2007. 2. Status post 5 cycles of systemic chemotherapy with Revlimid and low-dose Decadron with good response to this treatment. 3. Status post autologous peripheral blood stem cell transplant at [facility] on Nov 20, 2007 under the care of Dr. Valarie Merino. 4. Status post treatment for disease recurrence with Velcade, Doxil and Decadron. Last dose given Nov 09, 2009. Discontinued secondary to intolerance but the patient had a good response to treatment at that time. 5. Status post palliative radiotherapy to the T2-T6 thoracic vertebrae completed 03/21/2011 under the care of Dr. Lisbeth Renshaw. 6. Systemic chemotherapy with Velcade 1.3 mg per meter squared given on days 1, 4, 8 and 11, and Doxil at 30 mg per meter squared given on day 4 in addition to Decadron status post 4 cycles, discontinued secondary to intolerance. 7. Systemic therapy with Velcade 1.3 mg/M2 subcutaneously in addition to Decadron 40 mg by mouth on a weekly basis, status post 20 cycles. The patient had good response with this treatment but it is discontinue today secondary to worsening peripheral neuropathy. 8. Palliative radiotherapy to the skull lesion as well as the left hip area under the care of Dr. Lisbeth Renshaw. 9. Systemic chemotherapy with Carfilzomib 20 mg/M2 on days 1, 2,  8, 9, 13 and 16 every 4 weeks in addition to weekly Decadron 40 mg by mouth. First dose on 04/19/2013. Status post 4 cycles, discontinued recently secondary to cardiac  dysfunction.  CURRENT THERAPY:  1. Pomalyst 4 mg by mouth daily for 21 days every 4 weeks in addition to dexamethasone 40 mg on a weekly basis. First dose started 12/02/2013. 2. Zometa 4 mg IV given every 3 months   INTERVAL HISTORY: [name] 63 y.o. female returns to the clinic today for follow up visit accompanied by her daughter and grand-daughter. The patient is feeling fine today with no specific complaints except for the itching and chronic back pain. She is tolerating her current treatment with Pomalyst fairly well with no significant adverse effects. She completed her most recent cycle on 05/19/2014.She denied having any significant fatigue or weakness. The patient denied having any significant chest pain, shortness of breath, or hemoptysis. She has no nausea or vomiting. She has no weight loss or night sweats. She continues to have mild peripheral neuropathy. She requests refills for her MS Contin and hydrocodone for pain management.  MEDICAL HISTORY: Past Medical History  Diagnosis Date  . Hypercholesterolemia   . Compression fracture 04/08/2007    pathologic compression fracture  . Hypothyroidism   . FHx: chemotherapy     s/p 5 cycle revlimid/low dose decadron,s/p velcade,doxil,decadron,  . Hx of radiation therapy 05/05/07-05/18/07,& 03/05/11-03/21/11-    l3&l5 in 2008, t2-t6 03/2011  . GERD (gastroesophageal reflux disease)   . Insomnia     associated with steroids  . Constipation     takes oxycontin,vicodin  . Hx of radiation therapy 05/05/2007 to 05/18/2007    palliative, L3-5  . Hx of radiation therapy 03/05/2011 to 03/21/2011    palliative T2-T6, c-spine  .  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 PONV (postoperative nausea and vomiting)   . Multiple myeloma dx'd 2009  . Metastasis to bone   . Family history of anesthesia complication     "daughter gets PONV too"  . Pneumonia     "several times"  . Stroke 2014    denies  residual on 01/27/2014    ALLERGIES:  has No Known Allergies.  MEDICATIONS:  Current Outpatient Prescriptions  Medication Sig Dispense Refill  . ALPRAZolam (XANAX) 0.5 MG tablet TAKE 1 TABLET BY MOUTH AS NEEDED FOR ANXIETY 30 tablet 3  . atorvastatin (LIPITOR) 40 MG tablet TAKE 1 TABLET (40 MG TOTAL) BY MOUTH DAILY. 30 tablet 3  . baclofen (LIORESAL) 10 MG tablet Start taking one tablet at bedtime x 1 week, then increase to one tablet twice daily 60 each 3  . celecoxib (CELEBREX) 200 MG capsule     . dexamethasone (DECADRON) 4 MG tablet Take 10 tablets (40 mg total) by mouth every Friday. 40 tablet 3  . Eszopiclone 3 MG TABS Take 3 mg by mouth at bedtime as needed (sleep). Take immediately before bedtime    . furosemide (LASIX) 40 MG tablet TAKE 1 TABLET BY MOUTH TWICE A DAY FOR FLUID AND SWELLING 60 tablet 10  . gabapentin (NEURONTIN) 600 MG tablet Take 600 mg by mouth 4 (four) times daily as needed (pain).    Marland Kitchen HYDROcodone-acetaminophen (NORCO) 7.5-325 MG per tablet Take 1 tablet by mouth every 6 (six) hours as needed for moderate pain. 30 tablet 0  . hyoscyamine (LEVSIN SL) 0.125 MG SL tablet Place 1 tablet (0.125 mg total) under the tongue every 6 (six) hours as needed. 30 tablet 4  . KLOR-CON M10 10 MEQ tablet TAKE 1 TABLET BY MOUTH TWICE A DAY 60 tablet 3  . levothyroxine (SYNTHROID, LEVOTHROID) 100 MCG tablet Take 50-100 mcg by mouth daily before breakfast. Take 0.5 tab (49mg) on Monday, Wednesday, and Friday. Take 1 tab (1023m) on Sunday, Tuesday, Thursday, and Saturday.    . morphine (MS CONTIN) 15 MG 12 hr tablet Take 1 tablet (15 mg total) by mouth every 12 (twelve) hours. 60 tablet 0  . Multiple Vitamin (MULITIVITAMIN WITH MINERALS) TABS Take 1 tablet by mouth every morning.     . ondansetron (ZOFRAN) 4 MG tablet Take 1 tablet (4 mg total) by mouth every 8 (eight) hours as needed for nausea or vomiting. 60 tablet 0  . pantoprazole (PROTONIX) 40 MG tablet Take 1 tablet (40 mg  total) by mouth 2 (two) times daily. 60 tablet 3  . pomalidomide (POMALYST) 4 MG capsule Take 1 capsule (4 mg total) by mouth daily. Take with water on days 1-21. Repeat every 28 days. 21 capsule 0  . promethazine-dextromethorphan (PROMETHAZINE-DM) 6.25-15 MG/5ML syrup TAKE 5 MLS BY MOUTH 4 (FOUR) TIMES DAILY AS NEEDED FOR COUGH. 180 mL 0  . SYNTHROID 100 MCG tablet TAKE 1 TABLET BY MOUTH EVERY DAY 90 tablet 1  . DEXILANT 60 MG capsule TAKE 1 CAPSULE (60 MG TOTAL) BY MOUTH DAILY. (Patient not taking: Reported on 05/30/2014) 30 capsule 6   No current facility-administered medications for this visit.    SURGICAL HISTORY:  Past Surgical History  Procedure Laterality Date  . Knee arthroscopy Right     "put pin in"  . Knee arthroscopy Right     "took pin out and corrected what was wrong"  . Video bronchoscopy  07/30/2011  Procedure: VIDEO BRONCHOSCOPY WITHOUT FLUORO;  Surgeon: Kathee Delton, MD;  Location: [facility];  Service: Cardiopulmonary;  Laterality: Bilateral;  . Cholecystectomy  1980's  . Portacath placement Right 2009  . Vaginal hysterectomy  1980's    REVIEW OF SYSTEMS:  Constitutional: negative Eyes: negative Ears, nose, mouth, throat, and face: negative Respiratory: negative Cardiovascular: negative Gastrointestinal: negative Genitourinary:negative Integument/breast: negative Hematologic/lymphatic: negative Musculoskeletal:positive for back pain Neurological: negative Behavioral/Psych: negative   PHYSICAL EXAMINATION: General appearance: alert, cooperative, fatigued and no distress Head: Normocephalic, without obvious abnormality, atraumatic Neck: no adenopathy, no JVD, supple, symmetrical, trachea midline and thyroid not enlarged, symmetric, no tenderness/mass/nodules Lymph nodes: Cervical, supraclavicular, and axillary nodes normal. Resp: clear to auscultation bilaterally Back: symmetric, no curvature. ROM normal. No CVA tenderness. Cardio: regular rate and  rhythm, S1, S2 normal, no murmur, click, rub or gallop GI: soft, non-tender; bowel sounds normal; no masses,  no organomegaly Extremities: extremities normal, atraumatic, no cyanosis or edema Neurologic: Alert and oriented X 3, normal strength and tone. Normal symmetric reflexes. Normal coordination and gait  ECOG PERFORMANCE STATUS: 1 - Symptomatic but completely ambulatory  Blood pressure 128/53, pulse 70, temperature 98.3 F (36.8 C), temperature source Oral, resp. rate 18, height _0  (1.626 m), weight 198 lb 11.2 oz (90.13 kg).  LABORATORY DATA: Lab Results  Component Value Date   WBC 3.4* 05/23/2014   HGB 12.0 05/23/2014   HCT 38.4 05/23/2014   MCV 98.7 05/23/2014   PLT 267 05/23/2014      Chemistry      Component Value Date/Time   NA 143 05/23/2014 1024   NA 139 05/10/2014 1154   K 3.6 05/23/2014 1024   K 3.9 05/10/2014 1154   CL 102 05/10/2014 1154   CL 105 12/17/2012 1015   CO2 25 05/23/2014 1024   CO2 29 05/10/2014 1154   BUN 11.6 05/23/2014 1024   BUN 10 05/10/2014 1154   CREATININE 1.0 05/23/2014 1024   CREATININE 0.82 05/10/2014 1154   CREATININE 0.79 02/08/2014 1030      Component Value Date/Time   CALCIUM 10.2 05/23/2014 1024   CALCIUM 8.5 05/10/2014 1154   ALKPHOS 87 05/23/2014 1024   ALKPHOS 70 05/10/2014 1154   AST 10 05/23/2014 1024   AST 10 05/10/2014 1154   ALT 16 05/23/2014 1024   ALT 11 05/10/2014 1154   BILITOT 1.22* 05/23/2014 1024   BILITOT 1.2 05/10/2014 1154     RADIOGRAPHIC STUDIES:  ASSESSMENT AND PLAN:  This is a very pleasant 63 years old [place] American female with recurrent multiple myeloma recently completed a course of treatment with Velcade and Decadron with improvement in her disease but this was discontinued secondary to peripheral neuropathy. The patient is tolerating her treatment with Carfilzomib and Decadron fairly well except for the recent shortness breath and cough which was felt to be secondary to congestive heart  failure from her treatment with Carfilzomib. She was started on treatment with Pomalyst and Decadron status post 6 cycles. She tolerated the last cycle of her treatment well. The recent myeloma panel showed continuous improvement in her disease. The patient was discussed with and also seen by Dr. Julien Nordmann. She will resume treatment with Pomalyst and Decadron as previously prescribed. Likely cycle #7. We will have her return in 3 weeks for another symptom management visit. She will be due for her Zometa which is given every 3 months during the first week in December and we will make arrangements so that she will receive this  treatment. She was given prescription refills for her MS Contin and her hydrocodone tablets. With no change in the strength or quantity. The patient will continue her current treatment with Zometa every 3 months as scheduled. She was advised to call immediately if she has any concerning symptoms in the interval. The patient voices understanding of current disease status and treatment options and is in agreement with the current care plan.  All questions were answered. The patient knows to call the clinic with any problems, questions or concerns. We can certainly see the patient much sooner if necessary.  Carlton Adam [name]  ADDENDUM: Hematology/Oncology Attending: I had a face to face encounter with the patient today. I recommended her care plan. This is a very pleasant 63 years old African female with relapsed multiple myeloma currently on treatment with Pomalyst and Decadron status post 7 cycles of her treatment. She is tolerating her treatment fairly well with no significant adverse effects. I recommended for the patient to proceed with her treatment as scheduled. She would come back for follow-up visit in 3 weeks for reevaluation before starting the next cycle of her treatment. She will continue on Zometa every 3 months for the bone disease. The patient was advised to call  immediately if she has any concerning symptoms in the interval.  Disclaimer: This note was dictated with voice recognition software. Similar sounding words can inadvertently be transcribed and may be missed upon review. Eilleen Kempf., MD 05/30/2014

## 2014-05-31 NOTE — Telephone Encounter (Signed)
RECEIVED A FAX FROM [facility] CONCERNING A CONFIRMATION OF PRESCRIPTION SHIPMENT FOR POMALYST ON 05/30/14.

## 2014-06-07 ENCOUNTER — Ambulatory Visit (HOSPITAL_BASED_OUTPATIENT_CLINIC_OR_DEPARTMENT_OTHER): Payer: Medicare Other

## 2014-06-07 DIAGNOSIS — C9000 Multiple myeloma not having achieved remission: Secondary | ICD-10-CM

## 2014-06-07 MED ORDER — SODIUM CHLORIDE 0.9 % IJ SOLN
10.0000 mL | INTRAMUSCULAR | Status: DC | PRN
  Administered 2014-06-07: 10 mL
  Filled 2014-06-07: qty 10

## 2014-06-07 MED ORDER — ZOLEDRONIC ACID 4 MG/100ML IV SOLN
4.0000 mg | Freq: Once | INTRAVENOUS | Status: AC
  Administered 2014-06-07: 4 mg via INTRAVENOUS
  Filled 2014-06-07: qty 100

## 2014-06-07 MED ORDER — HEPARIN SOD (PORK) LOCK FLUSH 100 UNIT/ML IV SOLN
500.0000 [IU] | Freq: Once | INTRAVENOUS | Status: AC | PRN
  Administered 2014-06-07: 500 [IU]
  Filled 2014-06-07: qty 5

## 2014-06-07 MED ORDER — SODIUM CHLORIDE 0.9 % IV SOLN
Freq: Once | INTRAVENOUS | Status: AC
  Administered 2014-06-07: 10:00:00 via INTRAVENOUS

## 2014-06-07 NOTE — Patient Instructions (Signed)

## 2014-06-14 ENCOUNTER — Ambulatory Visit: Payer: Self-pay | Admitting: Neurology

## 2014-06-15 ENCOUNTER — Other Ambulatory Visit: Payer: Self-pay | Admitting: *Deleted

## 2014-06-15 DIAGNOSIS — C9000 Multiple myeloma not having achieved remission: Secondary | ICD-10-CM

## 2014-06-15 MED ORDER — POMALIDOMIDE 4 MG PO CAPS: 4.0000 mg | ORAL_CAPSULE | Freq: Every day | ORAL | Status: DC

## 2014-06-15 NOTE — Telephone Encounter (Signed)
Faxed request from [facility] for pomalyst given to Dr. Julien Nordmann and signed.

## 2014-06-16 ENCOUNTER — Ambulatory Visit: Payer: Self-pay

## 2014-06-17 ENCOUNTER — Encounter: Payer: Self-pay | Admitting: *Deleted

## 2014-06-17 ENCOUNTER — Telehealth: Payer: Self-pay | Admitting: Neurology

## 2014-06-17 NOTE — Progress Notes (Signed)
No show letter sent for 06/14/2014

## 2014-06-17 NOTE — Telephone Encounter (Signed)
Pt no showed 06/14/14 appt w/ Dr. Posey Pronto. Appt was verbally confirmed with pt during reminder calls.   Danae Chen - please send no show letter / Gayleen Orem.

## 2014-06-18 ENCOUNTER — Other Ambulatory Visit: Payer: Self-pay | Admitting: Internal Medicine

## 2014-06-20 ENCOUNTER — Ambulatory Visit: Payer: Self-pay | Admitting: Gastroenterology

## 2014-06-21 ENCOUNTER — Ambulatory Visit (HOSPITAL_BASED_OUTPATIENT_CLINIC_OR_DEPARTMENT_OTHER): Payer: Medicare Other | Admitting: Internal Medicine

## 2014-06-21 ENCOUNTER — Encounter: Payer: Self-pay | Admitting: Internal Medicine

## 2014-06-21 ENCOUNTER — Telehealth: Payer: Self-pay | Admitting: Internal Medicine

## 2014-06-21 VITALS — BP 140/58 | HR 71 | Temp 98.2°F | Resp 18 | Ht 64.0 in | Wt 199.7 lb

## 2014-06-21 DIAGNOSIS — C9002 Multiple myeloma in relapse: Secondary | ICD-10-CM

## 2014-06-21 DIAGNOSIS — C9000 Multiple myeloma not having achieved remission: Secondary | ICD-10-CM

## 2014-06-21 LAB — LACTATE DEHYDROGENASE (CC13): LDH: 169 U/L (ref 125–245)

## 2014-06-21 LAB — COMPREHENSIVE METABOLIC PANEL (CC13)
ALT: 16 U/L (ref 0–55)
AST: 11 U/L (ref 5–34)
Albumin: 3.2 g/dL — ABNORMAL LOW (ref 3.5–5.0)
Alkaline Phosphatase: 85 U/L (ref 40–150)
Anion Gap: 8 mEq/L (ref 3–11)
BILIRUBIN TOTAL: 0.99 mg/dL (ref 0.20–1.20)
BUN: 12.3 mg/dL (ref 7.0–26.0)
CO2: 30 mEq/L — ABNORMAL HIGH (ref 22–29)
CREATININE: 1 mg/dL (ref 0.6–1.1)
Calcium: 9.8 mg/dL (ref 8.4–10.4)
Chloride: 107 mEq/L (ref 98–109)
EGFR: 69 mL/min/{1.73_m2} — ABNORMAL LOW (ref >90)
Glucose: 80 mg/dl (ref 70–140)
Potassium: 3.6 mEq/L (ref 3.5–5.1)
SODIUM: 145 meq/L (ref 136–145)
TOTAL PROTEIN: 5.9 g/dL — AB (ref 6.4–8.3)

## 2014-06-21 LAB — CBC WITH DIFFERENTIAL/PLATELET
BASO%: 1 % (ref 0.0–2.0)
Basophils Absolute: 0 10*3/uL (ref 0.0–0.1)
EOS%: 7 % (ref 0.0–7.0)
Eosinophils Absolute: 0.2 10*3/uL (ref 0.0–0.5)
HEMATOCRIT: 32.4 % — AB (ref 34.8–46.6)
HGB: 10.1 g/dL — ABNORMAL LOW (ref 11.6–15.9)
LYMPH%: 33.6 % (ref 14.0–49.7)
MCH: 30.9 pg (ref 25.1–34.0)
MCHC: 31.3 g/dL — AB (ref 31.5–36.0)
MCV: 98.8 fL (ref 79.5–101.0)
MONO#: 0.3 10*3/uL (ref 0.1–0.9)
MONO%: 12.5 % (ref 0.0–14.0)
NEUT#: 1.1 10*3/uL — ABNORMAL LOW (ref 1.5–6.5)
NEUT%: 45.9 % (ref 38.4–76.8)
Platelets: 224 10*3/uL (ref 145–400)
RBC: 3.28 10*6/uL — AB (ref 3.70–5.45)
RDW: 18.4 % — ABNORMAL HIGH (ref 11.2–14.5)
WBC: 2.5 10*3/uL — AB (ref 3.9–10.3)
lymph#: 0.8 10*3/uL — ABNORMAL LOW (ref 0.9–3.3)

## 2014-06-21 MED ORDER — MORPHINE SULFATE ER 15 MG PO TBCR: 15.0000 mg | EXTENDED_RELEASE_TABLET | Freq: Two times a day (BID) | ORAL | Status: DC

## 2014-06-21 MED ORDER — HYDROCODONE-ACETAMINOPHEN 7.5-325 MG PO TABS: 1.0000 | ORAL_TABLET | Freq: Four times a day (QID) | ORAL | Status: DC | PRN

## 2014-06-21 NOTE — Telephone Encounter (Signed)
gv and printed appt sched an davs for pt for Jan.. °

## 2014-06-21 NOTE — Progress Notes (Signed)
[name] [phone]   Fax:(336) [place], [place] [address]  DIAGNOSIS: Recurrent multiple myeloma initially diagnosed in October 2008.   PRIOR THERAPY:  1. Status post palliative radiotherapy to the lumbar spine between L3 and L5. The patient received a total dose of 3000 cGy in 10 fractions under the care of Dr. Lisbeth Renshaw between May 05, 2007 through May 18, 2007. 2. Status post 5 cycles of systemic chemotherapy with Revlimid and low-dose Decadron with good response to this treatment. 3. Status post autologous peripheral blood stem cell transplant at [facility] on Nov 20, 2007 under the care of Dr. Valarie Merino. 4. Status post treatment for disease recurrence with Velcade, Doxil and Decadron. Last dose given Nov 09, 2009. Discontinued secondary to intolerance but the patient had a good response to treatment at that time. 5. Status post palliative radiotherapy to the T2-T6 thoracic vertebrae completed 03/21/2011 under the care of Dr. Lisbeth Renshaw. 6. Systemic chemotherapy with Velcade 1.3 mg per meter squared given on days 1, 4, 8 and 11, and Doxil at 30 mg per meter squared given on day 4 in addition to Decadron status post 4 cycles, discontinued secondary to intolerance. 7. Systemic therapy with Velcade 1.3 mg/M2 subcutaneously in addition to Decadron 40 mg by mouth on a weekly basis, status post 20 cycles. The patient had good response with this treatment but it is discontinue today secondary to worsening peripheral neuropathy. 8. Palliative radiotherapy to the skull lesion as well as the left hip area under the care of Dr. Lisbeth Renshaw. 9. Systemic chemotherapy with Carfilzomib 20 mg/M2 on days 1, 2,  8, 9, 13 and 16 every 4 weeks in addition to weekly Decadron 40 mg by mouth. First dose on 04/19/2013. Status post 4 cycles, discontinued recently secondary to cardiac  dysfunction.  CURRENT THERAPY:  1. Pomalyst 4 mg by mouth daily for 21 days every 4 weeks in addition to dexamethasone 40 mg on a weekly basis. First dose started 12/02/2013. 2. Zometa 4 mg IV given every 3 months   INTERVAL HISTORY: [name] 63 y.o. female returns to the clinic today for follow up visit accompanied by her daughter and great grandson. The patient is feeling fine today with no specific complaints except for the chronic back pain. She is tolerating her current treatment with Pomalyst fairly well with no significant adverse effects. She is expected to start the next cycle next week. She denied having any significant fatigue or weakness. The patient denied having any significant chest pain, shortness of breath, or hemoptysis. She has no nausea or vomiting. She has no weight loss or night sweats. She continues to have mild peripheral neuropathy and currently on gabapentin.   MEDICAL HISTORY: Past Medical History  Diagnosis Date  . Hypercholesterolemia   . Compression fracture 04/08/2007    pathologic compression fracture  . Hypothyroidism   . FHx: chemotherapy     s/p 5 cycle revlimid/low dose decadron,s/p velcade,doxil,decadron,  . Hx of radiation therapy 05/05/07-05/18/07,& 03/05/11-03/21/11-    l3&l5 in 2008, t2-t6 03/2011  . GERD (gastroesophageal reflux disease)   . Insomnia     associated with steroids  . Constipation     takes oxycontin,vicodin  . Hx of radiation therapy 05/05/2007 to 05/18/2007    palliative, L3-5  . Hx of radiation therapy 03/05/2011 to 03/21/2011    palliative T2-T6, c-spine  . History of autologous stem  cell transplant 11/20/2007    [facility], Dr Valarie Merino  . PONV (postoperative nausea and vomiting)   . Multiple myeloma dx'd 2009  . Metastasis to bone   . Family history of anesthesia complication     "daughter gets PONV too"  . Pneumonia     "several times"  . Stroke 2014    denies residual on 01/27/2014    ALLERGIES:  has No Known  Allergies.  MEDICATIONS:  Current Outpatient Prescriptions  Medication Sig Dispense Refill  . ALPRAZolam (XANAX) 0.5 MG tablet TAKE 1 TABLET BY MOUTH AS NEEDED FOR ANXIETY 30 tablet 3  . baclofen (LIORESAL) 10 MG tablet Start taking one tablet at bedtime x 1 week, then increase to one tablet twice daily 60 each 3  . dexamethasone (DECADRON) 4 MG tablet Take 10 tablets (40 mg total) by mouth every Friday. 40 tablet 3  . Eszopiclone 3 MG TABS Take 3 mg by mouth at bedtime as needed (sleep). Take immediately before bedtime    . furosemide (LASIX) 40 MG tablet TAKE 1 TABLET BY MOUTH TWICE A DAY FOR FLUID AND SWELLING 60 tablet 10  . gabapentin (NEURONTIN) 600 MG tablet Take 600 mg by mouth 4 (four) times daily as needed (pain).    Marland Kitchen gabapentin (NEURONTIN) 600 MG tablet TAKE 1 TABLET 4 X DAILY AS NEEDED FOR PAIN OR CRAMPS 120 tablet 6  . HYDROcodone-acetaminophen (NORCO) 7.5-325 MG per tablet Take 1 tablet by mouth every 6 (six) hours as needed for moderate pain. 30 tablet 0  . hyoscyamine (LEVSIN SL) 0.125 MG SL tablet Place 1 tablet (0.125 mg total) under the tongue every 6 (six) hours as needed. 30 tablet 4  . KLOR-CON M10 10 MEQ tablet TAKE 1 TABLET BY MOUTH TWICE A DAY 60 tablet 3  . levothyroxine (SYNTHROID, LEVOTHROID) 100 MCG tablet Take 50-100 mcg by mouth daily before breakfast. Take 0.5 tab (71mg) on Monday, Wednesday, and Friday. Take 1 tab (1031m) on Sunday, Tuesday, Thursday, and Saturday.    . morphine (MS CONTIN) 15 MG 12 hr tablet Take 1 tablet (15 mg total) by mouth every 12 (twelve) hours. 60 tablet 0  . Multiple Vitamin (MULITIVITAMIN WITH MINERALS) TABS Take 1 tablet by mouth every morning.     . ondansetron (ZOFRAN) 4 MG tablet Take 1 tablet (4 mg total) by mouth every 8 (eight) hours as needed for nausea or vomiting. 60 tablet 0  . pantoprazole (PROTONIX) 40 MG tablet Take 1 tablet (40 mg total) by mouth 2 (two) times daily. 60 tablet 3  . pomalidomide (POMALYST) 4 MG capsule  Take 1 capsule (4 mg total) by mouth daily. Take with water on days 1-21. Repeat every 28 days. 21 capsule 0  . promethazine-dextromethorphan (PROMETHAZINE-DM) 6.25-15 MG/5ML syrup TAKE 5 MLS BY MOUTH 4 (FOUR) TIMES DAILY AS NEEDED FOR COUGH. 180 mL 0   No current facility-administered medications for this visit.    SURGICAL HISTORY:  Past Surgical History  Procedure Laterality Date  . Knee arthroscopy Right     "put pin in"  . Knee arthroscopy Right     "took pin out and corrected what was wrong"  . Video bronchoscopy  07/30/2011    Procedure: VIDEO BRONCHOSCOPY WITHOUT FLUORO;  Surgeon: KeKathee DeltonMD;  [facility];  Service: Cardiopulmonary;  Laterality: Bilateral;  . Cholecystectomy  1980's  . Portacath placement Right 2009  . Vaginal hysterectomy  1980's    REVIEW OF SYSTEMS:  Constitutional: negative Eyes: negative Ears,  nose, mouth, throat, and face: negative Respiratory: negative Cardiovascular: negative Gastrointestinal: negative Genitourinary:negative Integument/breast: negative Hematologic/lymphatic: negative Musculoskeletal:positive for back pain Neurological: negative Behavioral/Psych: negative   PHYSICAL EXAMINATION: General appearance: alert, cooperative, fatigued and no distress Head: Normocephalic, without obvious abnormality, atraumatic Neck: no adenopathy, no JVD, supple, symmetrical, trachea midline and thyroid not enlarged, symmetric, no tenderness/mass/nodules Lymph nodes: Cervical, supraclavicular, and axillary nodes normal. Resp: clear to auscultation bilaterally Back: symmetric, no curvature. ROM normal. No CVA tenderness. Cardio: regular rate and rhythm, S1, S2 normal, no murmur, click, rub or gallop GI: soft, non-tender; bowel sounds normal; no masses,  no organomegaly Extremities: extremities normal, atraumatic, no cyanosis or edema Neurologic: Alert and oriented X 3, normal strength and tone. Normal symmetric reflexes. Normal  coordination and gait  ECOG PERFORMANCE STATUS: 1 - Symptomatic but completely ambulatory  Blood pressure 140/58, pulse 71, temperature 98.2 F (36.8 C), temperature source Oral, resp. rate 18, height _0  (1.626 m), weight 199 lb 11.2 oz (90.583 kg), SpO2 100 %.  LABORATORY DATA: Lab Results  Component Value Date   WBC 2.5* 06/21/2014   HGB 10.1* 06/21/2014   HCT 32.4* 06/21/2014   MCV 98.8 06/21/2014   PLT 224 06/21/2014      Chemistry      Component Value Date/Time   NA 143 05/23/2014 1024   NA 139 05/10/2014 1154   K 3.6 05/23/2014 1024   K 3.9 05/10/2014 1154   CL 102 05/10/2014 1154   CL 105 12/17/2012 1015   CO2 25 05/23/2014 1024   CO2 29 05/10/2014 1154   BUN 11.6 05/23/2014 1024   BUN 10 05/10/2014 1154   CREATININE 1.0 05/23/2014 1024   CREATININE 0.82 05/10/2014 1154   CREATININE 0.79 02/08/2014 1030      Component Value Date/Time   CALCIUM 10.2 05/23/2014 1024   CALCIUM 8.5 05/10/2014 1154   ALKPHOS 87 05/23/2014 1024   ALKPHOS 70 05/10/2014 1154   AST 10 05/23/2014 1024   AST 10 05/10/2014 1154   ALT 16 05/23/2014 1024   ALT 11 05/10/2014 1154   BILITOT 1.22* 05/23/2014 1024   BILITOT 1.2 05/10/2014 1154     RADIOGRAPHIC STUDIES:  ASSESSMENT AND PLAN:  This is a very pleasant 63 years old [place] American female with recurrent multiple myeloma recently completed a course of treatment with Velcade and Decadron with improvement in her disease but this was discontinued secondary to peripheral neuropathy. The patient tolerated her treatment with Carfilzomib and Decadron fairly well except for the recent shortness breath and cough which was felt to be secondary to congestive heart failure from her treatment with Carfilzomib. This treatment was discontinued. She was started on treatment with Pomalyst and Decadron status post 7 cycles. She tolerated the last cycle of her treatment well. I recommended for her to continue her treatment and she will start cycle  #8 next week.  Her absolute neutrophil count is low today but hopefully this will recover over the next few days since the patient is currently off treatment, but she was advised to call immediately if she has any fever or chills. She would come back for followup visit in 4 weeks for evaluation after repeating myeloma panel. The patient will continue her current treatment with Zometa every 3 months as scheduled. She was advised to call immediately if she has any concerning symptoms in the interval. The patient voices understanding of current disease status and treatment options and is in agreement with the current care plan.  All  questions were answered. The patient knows to call the clinic with any problems, questions or concerns. We can certainly see the patient much sooner if necessary.  Disclaimer: This note was dictated with voice recognition software. Similar sounding words can inadvertently be transcribed and may be missed upon review.

## 2014-06-28 ENCOUNTER — Other Ambulatory Visit: Payer: Self-pay

## 2014-06-28 ENCOUNTER — Encounter: Payer: Self-pay | Admitting: Internal Medicine

## 2014-06-28 ENCOUNTER — Ambulatory Visit (INDEPENDENT_AMBULATORY_CARE_PROVIDER_SITE_OTHER): Payer: Medicare Other | Admitting: Physician Assistant

## 2014-06-28 ENCOUNTER — Other Ambulatory Visit: Payer: Self-pay | Admitting: Internal Medicine

## 2014-06-28 VITALS — BP 134/72 | HR 68 | Temp 97.5°F | Resp 18 | Ht 64.75 in | Wt 198.4 lb

## 2014-06-28 DIAGNOSIS — R110 Nausea: Secondary | ICD-10-CM

## 2014-06-28 DIAGNOSIS — M62838 Other muscle spasm: Secondary | ICD-10-CM

## 2014-06-28 DIAGNOSIS — Z79899 Other long term (current) drug therapy: Secondary | ICD-10-CM

## 2014-06-28 DIAGNOSIS — R202 Paresthesia of skin: Secondary | ICD-10-CM

## 2014-06-28 DIAGNOSIS — M7989 Other specified soft tissue disorders: Secondary | ICD-10-CM

## 2014-06-28 DIAGNOSIS — Z9109 Other allergy status, other than to drugs and biological substances: Secondary | ICD-10-CM

## 2014-06-28 LAB — CBC WITH DIFFERENTIAL/PLATELET
BASOS ABS: 0.1 10*3/uL (ref 0.0–0.1)
Basophils Relative: 2 % — ABNORMAL HIGH (ref 0–1)
Eosinophils Absolute: 0.1 10*3/uL (ref 0.0–0.7)
Eosinophils Relative: 2 % (ref 0–5)
HEMATOCRIT: 34.1 % — AB (ref 36.0–46.0)
Hemoglobin: 10.5 g/dL — ABNORMAL LOW (ref 12.0–15.0)
Lymphocytes Relative: 32 % (ref 12–46)
Lymphs Abs: 1.2 10*3/uL (ref 0.7–4.0)
MCH: 30.8 pg (ref 26.0–34.0)
MCHC: 30.8 g/dL (ref 30.0–36.0)
MCV: 100 fL (ref 78.0–100.0)
MONO ABS: 0.7 10*3/uL (ref 0.1–1.0)
MONOS PCT: 18 % — AB (ref 3–12)
MPV: 9.7 fL (ref 8.6–12.4)
NEUTROS ABS: 1.7 10*3/uL (ref 1.7–7.7)
Neutrophils Relative %: 46 % (ref 43–77)
PLATELETS: 300 10*3/uL (ref 150–400)
RBC: 3.41 MIL/uL — ABNORMAL LOW (ref 3.87–5.11)
RDW: 18 % — ABNORMAL HIGH (ref 11.5–15.5)
WBC: 3.7 10*3/uL — AB (ref 4.0–10.5)

## 2014-06-28 LAB — HEPATIC FUNCTION PANEL
ALBUMIN: 3.5 g/dL (ref 3.5–5.2)
ALK PHOS: 64 U/L (ref 39–117)
ALT: 12 U/L (ref 0–35)
AST: 9 U/L (ref 0–37)
Bilirubin, Direct: 0.2 mg/dL (ref 0.0–0.3)
Indirect Bilirubin: 0.6 mg/dL (ref 0.2–1.2)
TOTAL PROTEIN: 5.9 g/dL — AB (ref 6.0–8.3)
Total Bilirubin: 0.8 mg/dL (ref 0.2–1.2)

## 2014-06-28 LAB — BASIC METABOLIC PANEL WITH GFR
BUN: 13 mg/dL (ref 6–23)
CO2: 28 mEq/L (ref 19–32)
Calcium: 9.1 mg/dL (ref 8.4–10.5)
Chloride: 107 mEq/L (ref 96–112)
Creat: 1.12 mg/dL — ABNORMAL HIGH (ref 0.50–1.10)
GFR, EST NON AFRICAN AMERICAN: 53 mL/min — AB
GFR, Est African American: 61 mL/min
Glucose, Bld: 89 mg/dL (ref 70–99)
Potassium: 4 mEq/L (ref 3.5–5.3)
SODIUM: 141 meq/L (ref 135–145)

## 2014-06-28 LAB — MAGNESIUM: Magnesium: 1.8 mg/dL (ref 1.5–2.5)

## 2014-06-28 MED ORDER — FEXOFENADINE HCL 30 MG PO TABS: 30.0000 mg | ORAL_TABLET | Freq: Two times a day (BID) | ORAL | Status: DC

## 2014-06-28 MED ORDER — ONDANSETRON 8 MG PO TBDP: 8.0000 mg | ORAL_TABLET | Freq: Three times a day (TID) | ORAL | Status: DC | PRN

## 2014-06-28 MED ORDER — CYCLOBENZAPRINE HCL 10 MG PO TABS: ORAL_TABLET | ORAL | Status: DC

## 2014-06-28 NOTE — Progress Notes (Signed)
Subjective:    Patient ID: [name], female    DOB: Dec 22, 1950, 63 y.o.   MRN: [id number]  Foot Pain This is a new problem. Episode onset: Started about 1 week ago. The problem occurs constantly. Associated symptoms include abdominal pain and nausea. Pertinent negatives include no chest pain, chills, coughing, diaphoresis, fatigue, fever, rash or vomiting. She has tried nothing for the symptoms.  Patient is on Pomalidomide for Multiple Myeloma. Patient needs refills of Zofran and would like prescription for flexeril for muscle cramps. Patient states she has been taking aspirin OTC. GFR= 69 on 06/21/14 Review of Systems  Constitutional: Negative.  Negative for fever, chills, diaphoresis and fatigue.  HENT: Negative.   Eyes: Negative.   Respiratory: Positive for shortness of breath. Negative for cough, chest tightness and wheezing.        Mild SOB occasionally.  Cardiovascular: Positive for leg swelling. Negative for chest pain and palpitations.       Left ankle and lower leg swelling, but a lot less than before.  Gastrointestinal: Positive for nausea, abdominal pain and diarrhea. Negative for vomiting and constipation.  Genitourinary: Negative.   Musculoskeletal: Negative.        Except left foot pain and muscle cramps  Skin: Negative.  Negative for rash.  Neurological: Negative.   Psychiatric/Behavioral: Negative.    Past Medical History  Diagnosis Date  . Hypercholesterolemia   . Compression fracture 04/08/2007    pathologic compression fracture  . Hypothyroidism   . FHx: chemotherapy     s/p 5 cycle revlimid/low dose decadron,s/p velcade,doxil,decadron,  . Hx of radiation therapy 05/05/07-05/18/07,& 03/05/11-03/21/11-    l3&l5 in 2008, t2-t6 03/2011  . GERD (gastroesophageal reflux disease)   . Insomnia     associated with steroids  . Constipation     takes oxycontin,vicodin  . Hx of radiation therapy 05/05/2007 to 05/18/2007    palliative, L3-5  . Hx of radiation  therapy 03/05/2011 to 03/21/2011    palliative T2-T6, c-spine  . 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 PONV (postoperative nausea and vomiting)   . Multiple myeloma dx'd 2009  . Metastasis to bone   . Family history of anesthesia complication     "daughter gets PONV too"  . Pneumonia     "several times"  . Stroke 2014    denies residual on 01/27/2014   Current Outpatient Prescriptions on File Prior to Visit  Medication Sig Dispense Refill  . ALPRAZolam (XANAX) 0.5 MG tablet TAKE 1 TABLET BY MOUTH AS NEEDED FOR ANXIETY 30 tablet 3  . baclofen (LIORESAL) 10 MG tablet Start taking one tablet at bedtime x 1 week, then increase to one tablet twice daily 60 each 3  . dexamethasone (DECADRON) 4 MG tablet Take 10 tablets (40 mg total) by mouth every Friday. 40 tablet 3  . Eszopiclone 3 MG TABS Take 3 mg by mouth at bedtime as needed (sleep). Take immediately before bedtime    . furosemide (LASIX) 40 MG tablet TAKE 1 TABLET BY MOUTH TWICE A DAY FOR FLUID AND SWELLING 60 tablet 10  . gabapentin (NEURONTIN) 600 MG tablet Take 600 mg by mouth 4 (four) times daily as needed (pain).    Marland Kitchen gabapentin (NEURONTIN) 600 MG tablet TAKE 1 TABLET 4 X DAILY AS NEEDED FOR PAIN OR CRAMPS 120 tablet 6  . HYDROcodone-acetaminophen (NORCO) 7.5-325 MG per tablet Take 1 tablet by mouth every 6 (six) hours as needed for moderate  pain. 30 tablet 0  . hyoscyamine (LEVSIN SL) 0.125 MG SL tablet Place 1 tablet (0.125 mg total) under the tongue every 6 (six) hours as needed. 30 tablet 4  . KLOR-CON M10 10 MEQ tablet TAKE 1 TABLET BY MOUTH TWICE A DAY 60 tablet 3  . levothyroxine (SYNTHROID, LEVOTHROID) 100 MCG tablet Take 50-100 mcg by mouth daily before breakfast. Take 0.5 tab (66mg) on Monday, Wednesday, and Friday. Take 1 tab (1045m) on Sunday, Tuesday, Thursday, and Saturday.    . morphine (MS CONTIN) 15 MG 12 hr tablet Take 1 tablet (15 mg total) by mouth every 12 (twelve) hours. 60  tablet 0  . Multiple Vitamin (MULITIVITAMIN WITH MINERALS) TABS Take 1 tablet by mouth every morning.     . ondansetron (ZOFRAN) 4 MG tablet Take 1 tablet (4 mg total) by mouth every 8 (eight) hours as needed for nausea or vomiting. 60 tablet 0  . pantoprazole (PROTONIX) 40 MG tablet Take 1 tablet (40 mg total) by mouth 2 (two) times daily. 60 tablet 3  . pomalidomide (POMALYST) 4 MG capsule Take 1 capsule (4 mg total) by mouth daily. Take with water on days 1-21. Repeat every 28 days. 21 capsule 0  . promethazine-dextromethorphan (PROMETHAZINE-DM) 6.25-15 MG/5ML syrup TAKE 5 MLS BY MOUTH 4 (FOUR) TIMES DAILY AS NEEDED FOR COUGH. 180 mL 0   No current facility-administered medications on file prior to visit.   No Known Allergies    BP 134/72 mmHg  Pulse 68  Temp(Src) 97.5 F (36.4 C)  Resp 18  Ht 5' 4.75" (1.645 m)  Wt 198 lb 6.4 oz (89.994 kg)  BMI 33.26 kg/m2 Wt Readings from Last 3 Encounters:  06/28/14 198 lb 6.4 oz (89.994 kg)  06/21/14 199 lb 11.2 oz (90.583 kg)  05/30/14 198 lb 11.2 oz (90.13 kg)   Objective:   Physical Exam  Constitutional: She is oriented to person, place, and time. She appears well-developed and well-nourished. She does not have a sickly appearance. No distress.  HENT:  Head: Normocephalic.  Right Ear: External ear and ear canal normal. Tympanic membrane is bulging.  Left Ear: External ear and ear canal normal. Tympanic membrane is bulging.  Nose: Nose normal. Right sinus exhibits no maxillary sinus tenderness and no frontal sinus tenderness. Left sinus exhibits no maxillary sinus tenderness and no frontal sinus tenderness.  Mouth/Throat: Uvula is midline, oropharynx is clear and moist and mucous membranes are normal. Mucous membranes are not pale and not dry. No trismus in the jaw. No uvula swelling. No oropharyngeal exudate, posterior oropharyngeal edema, posterior oropharyngeal erythema or tonsillar abscesses.  TMs bulging with clear fluid and with  normal light reflexes bilaterally.  TMs non-erythematous and non-edematous bilaterally.  Eyes: Conjunctivae and lids are normal. Pupils are equal, round, and reactive to light. Right eye exhibits no discharge. Left eye exhibits no discharge. No scleral icterus.  Neck: Trachea normal, normal range of motion and phonation normal. Neck supple. No tracheal tenderness present. No tracheal deviation present.  Cardiovascular: Normal rate, regular rhythm, S1 normal, S2 normal, normal heart sounds, intact distal pulses and normal pulses.  Exam reveals no gallop, no distant heart sounds and no friction rub.   No murmur heard. Pulmonary/Chest: Effort normal and breath sounds normal. No stridor. No respiratory distress. She has no decreased breath sounds. She has no wheezes. She has no rhonchi. She has no rales. She exhibits no tenderness.  Abdominal: Soft. Bowel sounds are normal. There is no tenderness. There is no  rebound and no guarding.  Musculoskeletal: Normal range of motion.       Right ankle: Normal. She exhibits normal range of motion, no swelling, no ecchymosis, no deformity, no laceration and normal pulse. No tenderness. Achilles tendon normal.       Left ankle: She exhibits swelling. She exhibits normal range of motion, no ecchymosis, no deformity, no laceration and normal pulse. Tenderness. Medial malleolus tenderness found. Achilles tendon normal.       Right lower leg: Normal. She exhibits no tenderness, no bony tenderness, no swelling, no edema, no deformity and no laceration.       Left lower leg: She exhibits tenderness. She exhibits no bony tenderness, no swelling, no edema, no deformity and no laceration.  Lymphadenopathy:  No tenderness or LAD.  Neurological: She is alert and oriented to person, place, and time. She has normal strength and normal reflexes. A sensory deficit is present. No cranial nerve deficit. Gait normal.  Monofilament test in left foot abnormal.  Patient did not feel  monofilament in metatarsal area, but did feel it everywhere else.  Normal monofilament test on right foot.  Skin: Skin is warm, dry and intact. No rash noted. She is not diaphoretic. No erythema. No pallor.  Psychiatric: She has a normal mood and affect. Her speech is normal and behavior is normal. Judgment and thought content normal. Cognition and memory are normal.  Vitals reviewed.  Assessment & Plan:  1. Leg swelling and Paresthesia of left foot Ordered labs to R/O possible causes.  If negative, then could be coming from chemo medication Ordered Ultrasound to R/O DVT.  Read information about Pomalidomide and how it can cause DVT, PE, MI.   Told patient to continue taking Aspirin 84m daily and to start wearing compression stockings and to elevate feet when sitting down. - Magnesium - Vitamin B12 - Vit D  25 hydroxy - D-dimer, quantitative  2. Muscle spasms of both lower extremities -Continue Flexeril as prescribed- cyclobenzaprine (FLEXERIL) 10 MG tablet; Take 1/2-1 tablet TID PRN for muscle spasms  Dispense: 90 tablet; Refill: 0  3. Nausea -Continue Zofran as prescribed for nausea- ondansetron (ZOFRAN ODT) 8 MG disintegrating tablet; Take 1 tablet (8 mg total) by mouth every 8 (eight) hours as needed for nausea or vomiting.  Dispense: 30 tablet; Refill: 1  4. Environmental allergies -Start taking Allegra as prescribed for allergies- fexofenadine (ALLEGRA) 30 MG tablet; Take 1 tablet (30 mg total) by mouth 2 (two) times daily.  Dispense: 30 tablet; Refill: 1  5. Encounter for long-term (current) use of medications Will monitor kidney and liver function - CBC with Differential - BASIC METABOLIC PANEL WITH GFR - Hepatic function panel  Discussed medication effects and SE's.  Pt agreed to treatment plan. If you are having more redness, swelling, SOB, chest pain, nausea, vomiting, fever, chills, sweats or fatigue, then go to ED immediately. Please keep your physical appt on  08/11/14.  [name], JStephani Police [name] 12:45 PM [facility]

## 2014-06-28 NOTE — Patient Instructions (Addendum)
-continue Aspirin as prescribed. -continue all medications as prescribed. -Start wearing light weight compression stockings to help with swelling. -Elevate your legs when sitting. -Please call oncologist and talk with them about numbness and tingling and swelling possible side effect of chemo.  I will call you with lab results.  If you notice more redness, swelling, chest pain, shortness of breath, fever, chills, sweats, then go to ED immediately.     Please keep your physical appt on 08/11/14  Deep Vein Thrombosis A deep vein thrombosis (DVT) is a blood clot that develops in the deep, larger veins of the leg, arm, or pelvis. These are more dangerous than clots that might form in veins near the surface of the body. A DVT can lead to serious and even life-threatening complications if the clot breaks off and travels in the bloodstream to the lungs.  A DVT can damage the valves in your leg veins so that instead of flowing upward, the blood pools in the lower leg. This is called post-thrombotic syndrome, and it can result in pain, swelling, discoloration, and sores on the leg. CAUSES Usually, several things contribute to the formation of blood clots. Contributing factors include:  The flow of blood slows down.  The inside of the vein is damaged in some way.  You have a condition that makes blood clot more easily. RISK FACTORS Some people are more likely than others to develop blood clots. Risk factors include:   Smoking.  Being overweight (obese).  Sitting or lying still for a long time. This includes long-distance travel, paralysis, or recovery from an illness or surgery. Other factors that increase risk are:   Older age, especially over 38 years of age.  Having a family history of blood clots or if you have already had a blot clot.  Having major or lengthy surgery. This is especially true for surgery on the hip, knee, or belly (abdomen). Hip surgery is particularly high  risk.  Having a long, thin tube (catheter) placed inside a vein during a medical procedure.  Breaking a hip or leg.  Having cancer or cancer treatment.  Pregnancy and childbirth.  Hormone changes make the blood clot more easily during pregnancy.  The fetus puts pressure on the veins of the pelvis.  There is a risk of injury to veins during delivery or a caesarean delivery. The risk is highest just after childbirth.  Medicines containing the female hormone estrogen. This includes birth control pills and hormone replacement therapy.  Other circulation or heart problems.  SIGNS AND SYMPTOMS When a clot forms, it can either partially or totally block the blood flow in that vein. Symptoms of a DVT can include:  Swelling of the leg or arm, especially if one side is much worse.  Warmth and redness of the leg or arm, especially if one side is much worse.  Pain in an arm or leg. If the clot is in the leg, symptoms may be more noticeable or worse when standing or walking. The symptoms of a DVT that has traveled to the lungs (pulmonary embolism, PE) usually start suddenly and include:  Shortness of breath.  Coughing.  Coughing up blood or blood-tinged mucus.  Chest pain. The chest pain is often worse with deep breaths.  Rapid heartbeat. Anyone with these symptoms should get emergency medical treatment right away. Do not wait to see if the symptoms will go away. Call your local emergency services (911 in the [place] if you have these symptoms. Do not drive  yourself to the hospital. DIAGNOSIS If a DVT is suspected, your health care provider will take a full medical history and perform a physical exam. Tests that also may be required include:  Blood tests, including studies of the clotting properties of the blood.  Ultrasound to see if you have clots in your legs or lungs.  X-rays to show the flow of blood when dye is injected into the veins (venogram).  Studies of your lungs if you  have any chest symptoms. PREVENTION  Exercise the legs regularly. Take a brisk 30-minute walk every day.  Maintain a weight that is appropriate for your height.  Avoid sitting or lying in bed for long periods of time without moving your legs.  Women, particularly those over the age of 45 years, should consider the risks and benefits of taking estrogen medicines, including birth control pills.  Do not smoke, especially if you take estrogen medicines.  Long-distance travel can increase your risk of DVT. You should exercise your legs by walking or pumping the muscles every hour.  Many of the risk factors above relate to situations that exist with hospitalization, either for illness, injury, or elective surgery. Prevention may include medical and nonmedical measures.  Your health care provider will assess you for the need for venous thromboembolism prevention when you are admitted to the hospital. If you are having surgery, your surgeon will assess you the day of or day after surgery. TREATMENT Once identified, a DVT can be treated. It can also be prevented in some circumstances. Once you have had a DVT, you may be at increased risk for a DVT in the future. The most common treatment for DVT is blood-thinning (anticoagulant) medicine, which reduces the blood's tendency to clot. Anticoagulants can stop new blood clots from forming and stop old clots from growing. They cannot dissolve existing clots. Your body does this by itself over time. Anticoagulants can be given by mouth, through an IV tube, or by injection. Your health care provider will determine the best program for you. Other medicines or treatments that may be used are:  Heparin or related medicines (low molecular weight heparin) are often the first treatment for a blood clot. They act quickly. However, they cannot be taken orally and must be given either in shot form or by IV tube.  Heparin can cause a fall in a component of blood that  stops bleeding and forms blood clots (platelets). You will be monitored with blood tests to be sure this does not occur.  Warfarin is an anticoagulant that can be swallowed. It takes a few days to start working, so usually heparin or related medicines are used in combination. Once warfarin is working, heparin is usually stopped.  Factor Xa inhibitor medicines, such as rivaroxaban and apixaban, also reduce blood clotting. These medicines are taken orally and can often be used without heparin or related medicines.  Less commonly, clot dissolving drugs (thrombolytics) are used to dissolve a DVT. They carry a high risk of bleeding, so they are used mainly in severe cases where your life or a part of your body is threatened.  Very rarely, a blood clot in the leg needs to be removed surgically.  If you are unable to take anticoagulants, your health care provider may arrange for you to have a filter placed in a main vein in your abdomen. This filter prevents clots from traveling to your lungs. HOME CARE INSTRUCTIONS  Take all medicines as directed by your health care provider.  Learn as much as you can about DVT.  Wear a medical alert bracelet or carry a medical alert card.  Ask your health care provider how soon you can go back to normal activities. It is important to stay active to prevent blood clots. If you are on anticoagulant medicine, avoid contact sports.  It is very important to exercise. This is especially important while traveling, sitting, or standing for long periods of time. Exercise your legs by walking or by tightening and relaxing your leg muscles regularly. Take frequent walks.  You may need to wear compression stockings. These are tight elastic stockings that apply pressure to the lower legs. This pressure can help keep the blood in the legs from clotting. Taking Warfarin Warfarin is a daily medicine that is taken by mouth. Your health care provider will advise you on the length  of treatment (usually 3-6 months, sometimes lifelong). If you take warfarin:  Understand how to take warfarin and foods that can affect how warfarin works in Veterinary surgeon.  Too much and too little warfarin are both dangerous. Too much warfarin increases the risk of bleeding. Too little warfarin continues to allow the risk for blood clots. Warfarin and Regular Blood Testing While taking warfarin, you will need to have regular blood tests to measure your blood clotting time. These blood tests usually include both the prothrombin time (PT) and international normalized ratio (INR) tests. The PT and INR results allow your health care provider to adjust your dose of warfarin. It is very important that you have your PT and INR tested as often as directed by your health care provider.  Warfarin and Your Diet Avoid major changes in your diet, or notify your health care provider before changing your diet. Arrange a visit with a registered dietitian to answer your questions. Many foods, especially foods high in vitamin K, can interfere with warfarin and affect the PT and INR results. You should eat a consistent amount of foods high in vitamin K. Foods high in vitamin K include:   Spinach, kale, broccoli, cabbage, collard and turnip greens, Brussels sprouts, peas, cauliflower, seaweed, and parsley.  Beef and pork liver.  Green tea.  Soybean oil. Warfarin with Other Medicines Many medicines can interfere with warfarin and affect the PT and INR results. You must:  Tell your health care provider about any and all medicines, vitamins, and supplements you take, including aspirin and other over-the-counter anti-inflammatory medicines. Be especially cautious with aspirin and anti-inflammatory medicines. Ask your health care provider before taking these.  Do not take or discontinue any prescribed or over-the-counter medicine except on the advice of your health care provider or pharmacist. Warfarin Side  Effects Warfarin can have side effects, such as easy bruising and difficulty stopping bleeding. Ask your health care provider or pharmacist about other side effects of warfarin. You will need to:  Hold pressure over cuts for longer than usual.  Notify your dentist and other health care providers that you are taking warfarin before you undergo any procedures where bleeding may occur. Warfarin with Alcohol and Tobacco   Drinking alcohol frequently can increase the effect of warfarin, leading to excess bleeding. It is best to avoid alcoholic drinks or to consume only very small amounts while taking warfarin. Notify your health care provider if you change your alcohol intake.   Do not use any tobacco products including cigarettes, chewing tobacco, or electronic cigarettes. If you smoke, quit. Ask your health care provider for help with quitting smoking. Alternative  Medicines to Warfarin: Factor Xa Inhibitor Medicines  These blood-thinning medicines are taken by mouth, usually for several weeks or longer. It is important to take the medicine every single day at the same time each day.  There are no regular blood tests required when using these medicines.  There are fewer food and drug interactions than with warfarin.  The side effects of this class of medicine are similar to those of warfarin, including excessive bruising or bleeding. Ask your health care provider or pharmacist about other potential side effects. SEEK MEDICAL CARE IF:  You notice a rapid heartbeat.  You feel weaker or more tired than usual.  You feel faint.  You notice increased bruising.  You feel your symptoms are not getting better in the time expected.  You believe you are having side effects of medicine. SEEK IMMEDIATE MEDICAL CARE IF:  You have chest pain.  You have trouble breathing.  You have new or increased swelling or pain in one leg.  You cough up blood.  You notice blood in vomit, in a bowel  movement, or in urine. MAKE SURE YOU:  Understand these instructions.  Will watch your condition.  Will get help right away if you are not doing well or get worse. Document Released: 06/17/2005 Document Revised: 11/01/2013 Document Reviewed: 02/22/2013 [facility] Patient Information 2015 [place], [place]. This information is not intended to replace advice given to you by your health care provider. Make sure you discuss any questions you have with your health care provider.

## 2014-06-29 ENCOUNTER — Other Ambulatory Visit: Payer: Self-pay | Admitting: Internal Medicine

## 2014-06-29 LAB — VITAMIN D 25 HYDROXY (VIT D DEFICIENCY, FRACTURES): Vit D, 25-Hydroxy: 45 ng/mL (ref 30–100)

## 2014-06-29 LAB — D-DIMER, QUANTITATIVE: D-Dimer, Quant: 0.95 ug/mL-FEU — ABNORMAL HIGH (ref 0.00–0.48)

## 2014-06-29 LAB — VITAMIN B12: Vitamin B-12: 543 pg/mL (ref 211–911)

## 2014-06-30 ENCOUNTER — Other Ambulatory Visit: Payer: Self-pay

## 2014-07-02 NOTE — Progress Notes (Signed)
This encounter was created in error - please disregard.

## 2014-07-05 ENCOUNTER — Ambulatory Visit
Admission: RE | Admit: 2014-07-05 | Discharge: 2014-07-05 | Disposition: A | Discharge status: Home / Self Care | Payer: Medicare Other | Source: Ambulatory Visit | Attending: Physician Assistant | Admitting: Physician Assistant

## 2014-07-05 DIAGNOSIS — M79605 Pain in left leg: Secondary | ICD-10-CM | POA: Diagnosis not present

## 2014-07-05 DIAGNOSIS — M7989 Other specified soft tissue disorders: Secondary | ICD-10-CM

## 2014-07-12 ENCOUNTER — Other Ambulatory Visit (HOSPITAL_BASED_OUTPATIENT_CLINIC_OR_DEPARTMENT_OTHER): Payer: Medicare Other

## 2014-07-12 DIAGNOSIS — C9000 Multiple myeloma not having achieved remission: Secondary | ICD-10-CM

## 2014-07-12 DIAGNOSIS — C9002 Multiple myeloma in relapse: Secondary | ICD-10-CM | POA: Diagnosis not present

## 2014-07-12 LAB — COMPREHENSIVE METABOLIC PANEL (CC13)
ALT: 12 U/L (ref 0–55)
AST: 9 U/L (ref 5–34)
Albumin: 3.4 g/dL — ABNORMAL LOW (ref 3.5–5.0)
Alkaline Phosphatase: 112 U/L (ref 40–150)
Anion Gap: 8 meq/L (ref 3–11)
BUN: 12.2 mg/dL (ref 7.0–26.0)
CO2: 24 meq/L (ref 22–29)
Calcium: 8.9 mg/dL (ref 8.4–10.4)
Chloride: 109 meq/L (ref 98–109)
Creatinine: 0.9 mg/dL (ref 0.6–1.1)
EGFR: 83 ml/min/1.73 m2 — ABNORMAL LOW
Glucose: 93 mg/dL (ref 70–140)
Potassium: 3.5 meq/L (ref 3.5–5.1)
Sodium: 141 meq/L (ref 136–145)
Total Bilirubin: 0.67 mg/dL (ref 0.20–1.20)
Total Protein: 6.6 g/dL (ref 6.4–8.3)

## 2014-07-12 LAB — CBC WITH DIFFERENTIAL/PLATELET
BASO%: 1.3 % (ref 0.0–2.0)
Basophils Absolute: 0.1 10*3/uL (ref 0.0–0.1)
EOS%: 10.7 % — ABNORMAL HIGH (ref 0.0–7.0)
Eosinophils Absolute: 0.5 10*3/uL (ref 0.0–0.5)
HCT: 35 % (ref 34.8–46.6)
HGB: 11 g/dL — ABNORMAL LOW (ref 11.6–15.9)
LYMPH%: 32.1 % (ref 14.0–49.7)
MCH: 31.2 pg (ref 25.1–34.0)
MCHC: 31.4 g/dL — ABNORMAL LOW (ref 31.5–36.0)
MCV: 99.2 fL (ref 79.5–101.0)
MONO#: 0.4 10*3/uL (ref 0.1–0.9)
MONO%: 9.2 % (ref 0.0–14.0)
NEUT#: 2.1 10*3/uL (ref 1.5–6.5)
NEUT%: 46.7 % (ref 38.4–76.8)
Platelets: 223 10*3/uL (ref 145–400)
RBC: 3.53 10*6/uL — ABNORMAL LOW (ref 3.70–5.45)
RDW: 17.3 % — ABNORMAL HIGH (ref 11.2–14.5)
WBC: 4.5 10*3/uL (ref 3.9–10.3)
lymph#: 1.4 10*3/uL (ref 0.9–3.3)

## 2014-07-12 LAB — TECHNOLOGIST REVIEW

## 2014-07-12 LAB — LACTATE DEHYDROGENASE (CC13): LDH: 152 U/L (ref 125–245)

## 2014-07-14 LAB — KAPPA/LAMBDA LIGHT CHAINS
Kappa free light chain: 2.41 mg/dL — ABNORMAL HIGH (ref 0.33–1.94)
Kappa:Lambda Ratio: 0.33 (ref 0.26–1.65)
Lambda Free Lght Chn: 7.26 mg/dL — ABNORMAL HIGH (ref 0.57–2.63)

## 2014-07-14 LAB — IGG, IGA, IGM
IGA: 139 mg/dL (ref 69–380)
IgG (Immunoglobin G), Serum: 995 mg/dL (ref 690–1700)
IgM, Serum: 20 mg/dL — ABNORMAL LOW (ref 52–322)

## 2014-07-14 LAB — BETA 2 MICROGLOBULIN, SERUM: BETA 2 MICROGLOBULIN: 2.69 mg/L — AB (ref <=2.51)

## 2014-07-18 ENCOUNTER — Other Ambulatory Visit: Payer: Self-pay | Admitting: *Deleted

## 2014-07-18 NOTE — Telephone Encounter (Signed)
THIS REFILL REQUEST FOR POMALYST WAS GIVEN TO DR.MOHAMED'S NURSE, DIANE BELL,RN/

## 2014-07-19 ENCOUNTER — Telehealth: Payer: Self-pay | Admitting: Internal Medicine

## 2014-07-19 ENCOUNTER — Ambulatory Visit (HOSPITAL_BASED_OUTPATIENT_CLINIC_OR_DEPARTMENT_OTHER): Payer: Medicare Other | Admitting: Internal Medicine

## 2014-07-19 ENCOUNTER — Encounter: Payer: Self-pay | Admitting: Internal Medicine

## 2014-07-19 ENCOUNTER — Other Ambulatory Visit: Payer: Self-pay | Admitting: *Deleted

## 2014-07-19 VITALS — BP 122/53 | HR 81 | Temp 97.7°F | Resp 18 | Ht 64.75 in | Wt 199.0 lb

## 2014-07-19 DIAGNOSIS — C9000 Multiple myeloma not having achieved remission: Secondary | ICD-10-CM

## 2014-07-19 DIAGNOSIS — G609 Hereditary and idiopathic neuropathy, unspecified: Secondary | ICD-10-CM | POA: Diagnosis not present

## 2014-07-19 DIAGNOSIS — C9002 Multiple myeloma in relapse: Secondary | ICD-10-CM

## 2014-07-19 MED ORDER — POMALIDOMIDE 4 MG PO CAPS: 4.0000 mg | ORAL_CAPSULE | Freq: Every day | ORAL | Status: DC

## 2014-07-19 NOTE — Progress Notes (Signed)
[place] [phone]   Fax:(336) [place], [place] [address]  DIAGNOSIS: Recurrent multiple myeloma initially diagnosed in October 2008.   PRIOR THERAPY:  1. Status post palliative radiotherapy to the lumbar spine between L3 and L5. The patient received a total dose of 3000 cGy in 10 fractions under the care of Dr. Lisbeth Renshaw between May 05, 2007 through May 18, 2007. 2. Status post 5 cycles of systemic chemotherapy with Revlimid and low-dose Decadron with good response to this treatment. 3. Status post autologous peripheral blood stem cell transplant at [facility] on Nov 20, 2007 under the care of Dr. Valarie Merino. 4. Status post treatment for disease recurrence with Velcade, Doxil and Decadron. Last dose given Nov 09, 2009. Discontinued secondary to intolerance but the patient had a good response to treatment at that time. 5. Status post palliative radiotherapy to the T2-T6 thoracic vertebrae completed 03/21/2011 under the care of Dr. Lisbeth Renshaw. 6. Systemic chemotherapy with Velcade 1.3 mg per meter squared given on days 1, 4, 8 and 11, and Doxil at 30 mg per meter squared given on day 4 in addition to Decadron status post 4 cycles, discontinued secondary to intolerance. 7. Systemic therapy with Velcade 1.3 mg/M2 subcutaneously in addition to Decadron 40 mg by mouth on a weekly basis, status post 20 cycles. The patient had good response with this treatment but it is discontinue today secondary to worsening peripheral neuropathy. 8. Palliative radiotherapy to the skull lesion as well as the left hip area under the care of Dr. Lisbeth Renshaw. 9. Systemic chemotherapy with Carfilzomib 20 mg/M2 on days 1, 2,  8, 9, 13 and 16 every 4 weeks in addition to weekly Decadron 40 mg by mouth. First dose on 04/19/2013. Status post 4 cycles, discontinued recently secondary to cardiac  dysfunction.  CURRENT THERAPY:  1. Pomalyst 4 mg by mouth daily for 21 days every 4 weeks in addition to dexamethasone 40 mg on a weekly basis. First dose started 12/02/2013. Status post 7 cycles. She will start cycle #8 on 07/26/2014 2. Zometa 4 mg IV given every 3 months   INTERVAL HISTORY: [name] 64 y.o. female returns to the clinic today for follow up visit accompanied by her daughter and mother. The patient is feeling fine today with no specific complaints except for the chronic back pain and intermittent pain in her neck especially with movement. She is tolerating her current treatment with Pomalyst fairly well with no significant adverse effects. She is expected to start cycle #8 next week. She denied having any significant fatigue or weakness. The patient denied having any significant chest pain, shortness of breath, or hemoptysis. She has no nausea or vomiting. She has no weight loss or night sweats. She continues to have mild peripheral neuropathy and currently on gabapentin. She had repeat myeloma panel and she is here for evaluation and discussion of her lab results.  MEDICAL HISTORY: Past Medical History  Diagnosis Date  . Hypercholesterolemia   . Compression fracture 04/08/2007    pathologic compression fracture  . Hypothyroidism   . FHx: chemotherapy     s/p 5 cycle revlimid/low dose decadron,s/p velcade,doxil,decadron,  . Hx of radiation therapy 05/05/07-05/18/07,& 03/05/11-03/21/11-    l3&l5 in 2008, t2-t6 03/2011  . GERD (gastroesophageal reflux disease)   . Insomnia     associated with steroids  . Constipation     takes oxycontin,vicodin  .  Hx of radiation therapy 05/05/2007 to 05/18/2007    palliative, L3-5  . Hx of radiation therapy 03/05/2011 to 03/21/2011    palliative T2-T6, c-spine  . 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 PONV (postoperative nausea and vomiting)   . Multiple myeloma dx'd 2009  . Metastasis to bone    . Family history of anesthesia complication     "daughter gets PONV too"  . Pneumonia     "several times"  . Stroke 2014    denies residual on 01/27/2014    ALLERGIES:  has No Known Allergies.  MEDICATIONS:  Current Outpatient Prescriptions  Medication Sig Dispense Refill  . ALPRAZolam (XANAX) 0.5 MG tablet TAKE 1 TABLET BY MOUTH AS NEEDED FOR ANXIETY 30 tablet 3  . baclofen (LIORESAL) 10 MG tablet Start taking one tablet at bedtime x 1 week, then increase to one tablet twice daily 60 each 3  . cyclobenzaprine (FLEXERIL) 10 MG tablet Take 1/2-1 tablet TID PRN for muscle spasms 90 tablet 0  . dexamethasone (DECADRON) 4 MG tablet Take 10 tablets (40 mg total) by mouth every Friday. 40 tablet 3  . Eszopiclone 3 MG TABS Take 3 mg by mouth at bedtime as needed (sleep). Take immediately before bedtime    . fexofenadine (ALLEGRA) 30 MG tablet Take 1 tablet (30 mg total) by mouth 2 (two) times daily. 30 tablet 1  . furosemide (LASIX) 40 MG tablet TAKE 1 TABLET BY MOUTH TWICE A DAY FOR FLUID AND SWELLING 60 tablet 10  . gabapentin (NEURONTIN) 600 MG tablet TAKE 1 TABLET 4 X DAILY AS NEEDED FOR PAIN OR CRAMPS 120 tablet 6  . HYDROcodone-acetaminophen (NORCO) 7.5-325 MG per tablet Take 1 tablet by mouth every 6 (six) hours as needed for moderate pain. 30 tablet 0  . hyoscyamine (LEVSIN SL) 0.125 MG SL tablet Place 1 tablet (0.125 mg total) under the tongue every 6 (six) hours as needed. 30 tablet 4  . KLOR-CON M10 10 MEQ tablet TAKE 1 TABLET BY MOUTH TWICE A DAY 60 tablet 3  . levothyroxine (SYNTHROID, LEVOTHROID) 100 MCG tablet Take 50-100 mcg by mouth daily before breakfast. Take 0.5 tab (63mg) on Monday, Wednesday, and Friday. Take 1 tab (1058m) on Sunday, Tuesday, Thursday, and Saturday.    . morphine (MS CONTIN) 15 MG 12 hr tablet Take 1 tablet (15 mg total) by mouth every 12 (twelve) hours. 60 tablet 0  . Multiple Vitamin (MULITIVITAMIN WITH MINERALS) TABS Take 1 tablet by mouth every  morning.     . ondansetron (ZOFRAN ODT) 8 MG disintegrating tablet Take 1 tablet (8 mg total) by mouth every 8 (eight) hours as needed for nausea or vomiting. 60 tablet 1  . pantoprazole (PROTONIX) 40 MG tablet Take 1 tablet (40 mg total) by mouth 2 (two) times daily. 60 tablet 3  . pomalidomide (POMALYST) 4 MG capsule Take 1 capsule (4 mg total) by mouth daily. Take with water on days 1-21. Repeat every 28 days. 21 capsule 0  . promethazine-dextromethorphan (PROMETHAZINE-DM) 6.25-15 MG/5ML syrup TAKE 5 MLS BY MOUTH 4 (FOUR) TIMES DAILY AS NEEDED FOR COUGH. 180 mL 0   No current facility-administered medications for this visit.    SURGICAL HISTORY:  Past Surgical History  Procedure Laterality Date  . Knee arthroscopy Right     "put pin in"  . Knee arthroscopy Right     "took pin out and corrected what was wrong"  . Video bronchoscopy  07/30/2011    Procedure: VIDEO BRONCHOSCOPY WITHOUT FLUORO;  Surgeon: Kathee Delton, MD;  Location: [facility];  Service: Cardiopulmonary;  Laterality: Bilateral;  . Cholecystectomy  1980's  . Portacath placement Right 2009  . Vaginal hysterectomy  1980's    REVIEW OF SYSTEMS:  Constitutional: negative Eyes: negative Ears, nose, mouth, throat, and face: negative Respiratory: negative Cardiovascular: negative Gastrointestinal: negative Genitourinary:negative Integument/breast: negative Hematologic/lymphatic: negative Musculoskeletal:positive for back pain Neurological: negative Behavioral/Psych: negative   PHYSICAL EXAMINATION: General appearance: alert, cooperative, fatigued and no distress Head: Normocephalic, without obvious abnormality, atraumatic Neck: no adenopathy, no JVD, supple, symmetrical, trachea midline and thyroid not enlarged, symmetric, no tenderness/mass/nodules Lymph nodes: Cervical, supraclavicular, and axillary nodes normal. Resp: clear to auscultation bilaterally Back: symmetric, no curvature. ROM normal. No CVA  tenderness. Cardio: regular rate and rhythm, S1, S2 normal, no murmur, click, rub or gallop GI: soft, non-tender; bowel sounds normal; no masses,  no organomegaly Extremities: extremities normal, atraumatic, no cyanosis or edema Neurologic: Alert and oriented X 3, normal strength and tone. Normal symmetric reflexes. Normal coordination and gait  ECOG PERFORMANCE STATUS: 1 - Symptomatic but completely ambulatory  Blood pressure 122/53, pulse 81, temperature 97.7 F (36.5 C), temperature source Oral, resp. rate 18, height 5' 4.75" (1.645 m), weight 199 lb (90.266 kg).  LABORATORY DATA: Lab Results  Component Value Date   WBC 4.5 07/12/2014   HGB 11.0* 07/12/2014   HCT 35.0 07/12/2014   MCV 99.2 07/12/2014   PLT 223 07/12/2014      Chemistry      Component Value Date/Time   NA 141 07/12/2014 0904   NA 141 06/28/2014 1323   K 3.5 07/12/2014 0904   K 4.0 06/28/2014 1323   CL 107 06/28/2014 1323   CL 105 12/17/2012 1015   CO2 24 07/12/2014 0904   CO2 28 06/28/2014 1323   BUN 12.2 07/12/2014 0904   BUN 13 06/28/2014 1323   CREATININE 0.9 07/12/2014 0904   CREATININE 1.12* 06/28/2014 1323   CREATININE 0.79 02/08/2014 1030      Component Value Date/Time   CALCIUM 8.9 07/12/2014 0904   CALCIUM 9.1 06/28/2014 1323   ALKPHOS 112 07/12/2014 0904   ALKPHOS 64 06/28/2014 1323   AST 9 07/12/2014 0904   AST 9 06/28/2014 1323   ALT 12 07/12/2014 0904   ALT 12 06/28/2014 1323   BILITOT 0.67 07/12/2014 0904   BILITOT 0.8 06/28/2014 1323     Other lab results: Beta-2 microglobulin 2.69, free kappa light chain 2.41, free lambda light chain 7.26 and a kappa/lambda ratio of 0.33. IgG 995, IgA 139 and IgM 20.  RADIOGRAPHIC STUDIES:  ASSESSMENT AND PLAN:  This is a very pleasant 64 years old [place] American female with recurrent multiple myeloma recently completed a course of treatment with Velcade and Decadron with improvement in her disease but this was discontinued secondary to  peripheral neuropathy. The patient tolerated her treatment with Carfilzomib and Decadron fairly well except for the recent shortness breath and cough which was felt to be secondary to congestive heart failure from her treatment with Carfilzomib. This treatment was discontinued. She was started on treatment with Pomalyst and Decadron status post 7 cycles. She tolerated the last cycle of her treatment well. The recent myeloma panel showed stable disease. I discussed the lab result and give a copy to the patient today. I recommended for her to continue her treatment and she will start cycle #8 next week.  She would come back for followup  visit in 4 weeks for evaluation after repeating myeloma panel. The patient will continue her current treatment with Zometa every 3 months as scheduled. She was advised to call immediately if she has any concerning symptoms in the interval. The patient voices understanding of current disease status and treatment options and is in agreement with the current care plan.  All questions were answered. The patient knows to call the clinic with any problems, questions or concerns. We can certainly see the patient much sooner if necessary.  Disclaimer: This note was dictated with voice recognition software. Similar sounding words can inadvertently be transcribed and may be missed upon review.

## 2014-07-19 NOTE — Telephone Encounter (Signed)
Gave avs & calendar for February. °

## 2014-07-24 ENCOUNTER — Other Ambulatory Visit: Payer: Self-pay | Admitting: Nurse Practitioner

## 2014-07-24 ENCOUNTER — Other Ambulatory Visit: Payer: Self-pay | Admitting: Neurology

## 2014-07-24 DIAGNOSIS — C9000 Multiple myeloma not having achieved remission: Secondary | ICD-10-CM

## 2014-07-25 ENCOUNTER — Encounter: Payer: Self-pay | Admitting: Emergency Medicine

## 2014-07-25 NOTE — Telephone Encounter (Signed)
Baclofen refill requested. Per last office note- patient to remain on medication. Patient no showed last appt - rescheduled for February. Refill approved for one month with note to keep follow up appt and sent to patient's pharmacy.

## 2014-07-29 ENCOUNTER — Other Ambulatory Visit: Payer: Self-pay | Admitting: Internal Medicine

## 2014-07-31 ENCOUNTER — Other Ambulatory Visit: Payer: Self-pay | Admitting: Nurse Practitioner

## 2014-07-31 ENCOUNTER — Other Ambulatory Visit: Payer: Self-pay | Admitting: Internal Medicine

## 2014-07-31 ENCOUNTER — Other Ambulatory Visit: Payer: Self-pay | Admitting: Emergency Medicine

## 2014-07-31 ENCOUNTER — Other Ambulatory Visit: Payer: Self-pay | Admitting: Neurology

## 2014-08-01 ENCOUNTER — Other Ambulatory Visit: Payer: Self-pay | Admitting: Internal Medicine

## 2014-08-01 DIAGNOSIS — R05 Cough: Secondary | ICD-10-CM

## 2014-08-01 DIAGNOSIS — R059 Cough, unspecified: Secondary | ICD-10-CM

## 2014-08-02 ENCOUNTER — Other Ambulatory Visit: Payer: Self-pay | Admitting: *Deleted

## 2014-08-02 MED ORDER — PROMETHAZINE-DM 6.25-15 MG/5ML PO SYRP: ORAL_SOLUTION | ORAL | Status: DC

## 2014-08-02 MED ORDER — BACLOFEN 10 MG PO TABS: 10.0000 mg | ORAL_TABLET | Freq: Two times a day (BID) | ORAL | Status: DC

## 2014-08-02 NOTE — Telephone Encounter (Signed)
Rx sent 

## 2014-08-03 ENCOUNTER — Other Ambulatory Visit: Payer: Self-pay | Admitting: Physician Assistant

## 2014-08-03 ENCOUNTER — Other Ambulatory Visit: Payer: Self-pay | Admitting: Internal Medicine

## 2014-08-03 ENCOUNTER — Other Ambulatory Visit: Payer: Self-pay | Admitting: *Deleted

## 2014-08-03 DIAGNOSIS — R110 Nausea: Secondary | ICD-10-CM

## 2014-08-03 MED ORDER — ONDANSETRON HCL 8 MG PO TABS: ORAL_TABLET | ORAL | Status: DC

## 2014-08-03 NOTE — Telephone Encounter (Signed)
Rx sent 

## 2014-08-04 ENCOUNTER — Other Ambulatory Visit: Payer: Self-pay | Admitting: Medical Oncology

## 2014-08-04 DIAGNOSIS — C9000 Multiple myeloma not having achieved remission: Secondary | ICD-10-CM

## 2014-08-04 MED ORDER — POMALIDOMIDE 4 MG PO CAPS: 4.0000 mg | ORAL_CAPSULE | Freq: Every day | ORAL | Status: DC

## 2014-08-04 NOTE — Progress Notes (Signed)
Pomalyst refill sent to [facility]

## 2014-08-05 ENCOUNTER — Encounter (HOSPITAL_COMMUNITY): Payer: Self-pay | Admitting: Emergency Medicine

## 2014-08-05 ENCOUNTER — Emergency Department (HOSPITAL_COMMUNITY)
Admission: EM | Admit: 2014-08-05 | Discharge: 2014-08-06 | Disposition: A | Discharge status: Home / Self Care | Payer: Medicare Other | Attending: Emergency Medicine | Admitting: Emergency Medicine

## 2014-08-05 DIAGNOSIS — Z85820 Personal history of malignant melanoma of skin: Secondary | ICD-10-CM | POA: Insufficient documentation

## 2014-08-05 DIAGNOSIS — Z79899 Other long term (current) drug therapy: Secondary | ICD-10-CM | POA: Insufficient documentation

## 2014-08-05 DIAGNOSIS — R609 Edema, unspecified: Secondary | ICD-10-CM

## 2014-08-05 DIAGNOSIS — Z8669 Personal history of other diseases of the nervous system and sense organs: Secondary | ICD-10-CM | POA: Insufficient documentation

## 2014-08-05 DIAGNOSIS — M791 Myalgia: Secondary | ICD-10-CM | POA: Insufficient documentation

## 2014-08-05 DIAGNOSIS — E039 Hypothyroidism, unspecified: Secondary | ICD-10-CM | POA: Diagnosis not present

## 2014-08-05 DIAGNOSIS — Z8673 Personal history of transient ischemic attack (TIA), and cerebral infarction without residual deficits: Secondary | ICD-10-CM | POA: Insufficient documentation

## 2014-08-05 DIAGNOSIS — E780 Pure hypercholesterolemia: Secondary | ICD-10-CM | POA: Diagnosis not present

## 2014-08-05 DIAGNOSIS — Z8701 Personal history of pneumonia (recurrent): Secondary | ICD-10-CM | POA: Diagnosis not present

## 2014-08-05 DIAGNOSIS — R600 Localized edema: Secondary | ICD-10-CM | POA: Diagnosis not present

## 2014-08-05 DIAGNOSIS — R110 Nausea: Secondary | ICD-10-CM | POA: Insufficient documentation

## 2014-08-05 DIAGNOSIS — Z87891 Personal history of nicotine dependence: Secondary | ICD-10-CM | POA: Insufficient documentation

## 2014-08-05 DIAGNOSIS — R2242 Localized swelling, mass and lump, left lower limb: Secondary | ICD-10-CM | POA: Diagnosis present

## 2014-08-05 DIAGNOSIS — K219 Gastro-esophageal reflux disease without esophagitis: Secondary | ICD-10-CM | POA: Diagnosis not present

## 2014-08-05 NOTE — ED Notes (Signed)
Bed: SW97 Expected date:  Expected time:  Means of arrival:  Comments:

## 2014-08-05 NOTE — ED Notes (Addendum)
Pt states that she has been having leg swelling and pain bilaterally since last Friday. Pt is also c/o pain in her rt arm. Pt has hx of CA with mets to the bone and is taking oral chemo. Pt also has hx of stroke in 2014. Pt is A&O x 4 with no facial drooping or weakness on either side.

## 2014-08-06 LAB — BRAIN NATRIURETIC PEPTIDE: B Natriuretic Peptide: 92.7 pg/mL (ref 0.0–100.0)

## 2014-08-06 LAB — CBC WITH DIFFERENTIAL/PLATELET
Basophils Absolute: 0 10*3/uL (ref 0.0–0.1)
Basophils Relative: 0 % (ref 0–1)
EOS PCT: 0 % (ref 0–5)
Eosinophils Absolute: 0 10*3/uL (ref 0.0–0.7)
HCT: 29.9 % — ABNORMAL LOW (ref 36.0–46.0)
Hemoglobin: 9.6 g/dL — ABNORMAL LOW (ref 12.0–15.0)
Lymphocytes Relative: 13 % (ref 12–46)
Lymphs Abs: 0.8 10*3/uL (ref 0.7–4.0)
MCH: 31.8 pg (ref 26.0–34.0)
MCHC: 32.1 g/dL (ref 30.0–36.0)
MCV: 99 fL (ref 78.0–100.0)
MONO ABS: 0.1 10*3/uL (ref 0.1–1.0)
Monocytes Relative: 2 % — ABNORMAL LOW (ref 3–12)
Neutro Abs: 5.3 10*3/uL (ref 1.7–7.7)
Neutrophils Relative %: 85 % — ABNORMAL HIGH (ref 43–77)
PLATELETS: 254 10*3/uL (ref 150–400)
RBC: 3.02 MIL/uL — AB (ref 3.87–5.11)
RDW: 16.1 % — AB (ref 11.5–15.5)
WBC: 6.3 10*3/uL (ref 4.0–10.5)

## 2014-08-06 LAB — COMPREHENSIVE METABOLIC PANEL
ALK PHOS: 70 U/L (ref 39–117)
ALT: 35 U/L (ref 0–35)
ANION GAP: 8 (ref 5–15)
AST: 27 U/L (ref 0–37)
Albumin: 3.3 g/dL — ABNORMAL LOW (ref 3.5–5.2)
BILIRUBIN TOTAL: 1.2 mg/dL (ref 0.3–1.2)
BUN: 17 mg/dL (ref 6–23)
CHLORIDE: 106 mmol/L (ref 96–112)
CO2: 24 mmol/L (ref 19–32)
Calcium: 8 mg/dL — ABNORMAL LOW (ref 8.4–10.5)
Creatinine, Ser: 1.14 mg/dL — ABNORMAL HIGH (ref 0.50–1.10)
GFR calc Af Amer: 58 mL/min — ABNORMAL LOW (ref >90)
GFR calc non Af Amer: 50 mL/min — ABNORMAL LOW (ref >90)
GLUCOSE: 206 mg/dL — AB (ref 70–99)
Potassium: 3.7 mmol/L (ref 3.5–5.1)
SODIUM: 138 mmol/L (ref 135–145)
Total Protein: 6.4 g/dL (ref 6.0–8.3)

## 2014-08-06 LAB — URINALYSIS, ROUTINE W REFLEX MICROSCOPIC
Bilirubin Urine: NEGATIVE
GLUCOSE, UA: NEGATIVE mg/dL
HGB URINE DIPSTICK: NEGATIVE
Ketones, ur: 15 mg/dL — AB
Leukocytes, UA: NEGATIVE
Nitrite: NEGATIVE
Protein, ur: NEGATIVE mg/dL
Specific Gravity, Urine: 1.024 (ref 1.005–1.030)
UROBILINOGEN UA: 1 mg/dL (ref 0.0–1.0)
pH: 5.5 (ref 5.0–8.0)

## 2014-08-06 LAB — TROPONIN I

## 2014-08-06 MED ORDER — HEPARIN SOD (PORK) LOCK FLUSH 100 UNIT/ML IV SOLN
500.0000 [IU] | Freq: Once | INTRAVENOUS | Status: DC
  Filled 2014-08-06: qty 5

## 2014-08-06 MED ORDER — FUROSEMIDE 10 MG/ML IJ SOLN
40.0000 mg | Freq: Once | INTRAMUSCULAR | Status: AC
  Administered 2014-08-06: 40 mg via INTRAVENOUS
  Filled 2014-08-06: qty 4

## 2014-08-06 NOTE — ED Provider Notes (Signed)
CSN: [id number]     Arrival date & time 08/05/14  2335 History   First MD Initiated Contact with Patient 08/06/14 0030     Chief Complaint  Patient presents with  . Leg Swelling    (Consider location/radiation/quality/duration/timing/severity/associated sxs/prior Treatment) HPI Comments: Patient is a 64 year old female with a history of multiple myeloma with metastases to bone, CVA in 2014, and esophageal reflux. She is currently on an oral chemotherapy regimen. She is a very poor historian, presenting to the ER today for further evaluation of leg swelling. Patient states that she has had intermittent swelling in her left lower extremity for 2 weeks. She states that she has also started noticing swelling in her right lower extremity 2 weeks ago. She does not report any modifying factors of her symptoms. She endorses paresthesias in her left upper extremity and her left foot. She denies being told by her oncologist that her chemotherapy medications may cause any side effects similar to her symptoms. She denies taking any medications for her symptoms. She does state that she saw her primary care provider for left lower extremity swelling and had a negative venous duplex ultrasound performed. Patient denies associated fever, chest pain, shortness of breath, syncope, nausea, or vomiting.  The history is provided by the patient. No language interpreter was used.    Past Medical History  Diagnosis Date  . Hypercholesterolemia   . Compression fracture 04/08/2007    pathologic compression fracture  . Hypothyroidism   . FHx: chemotherapy     s/p 5 cycle revlimid/low dose decadron,s/p velcade,doxil,decadron,  . Hx of radiation therapy 05/05/07-05/18/07,& 03/05/11-03/21/11-    l3&l5 in 2008, t2-t6 03/2011  . GERD (gastroesophageal reflux disease)   . Insomnia     associated with steroids  . Constipation     takes oxycontin,vicodin  . Hx of radiation therapy 05/05/2007 to 05/18/2007    palliative, L3-5   . Hx of radiation therapy 03/05/2011 to 03/21/2011    palliative T2-T6, c-spine  . 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 PONV (postoperative nausea and vomiting)   . Multiple myeloma dx'd 2009  . Metastasis to bone   . Family history of anesthesia complication     "daughter gets PONV too"  . Pneumonia     "several times"  . Stroke 2014    denies residual on 01/27/2014   Past Surgical History  Procedure Laterality Date  . Knee arthroscopy Right     "put pin in"  . Knee arthroscopy Right     "took pin out and corrected what was wrong"  . Video bronchoscopy  07/30/2011    Procedure: VIDEO BRONCHOSCOPY WITHOUT FLUORO;  Surgeon: Kathee Delton, MD;  Location: [facility];  Service: Cardiopulmonary;  Laterality: Bilateral;  . Cholecystectomy  1980's  . Portacath placement Right 2009  . Vaginal hysterectomy  1980's   Family History  Problem Relation Age of Onset  . Lung cancer Brother   . Colon cancer Maternal Uncle   . Breast cancer Maternal Grandmother    History  Substance Use Topics  . Smoking status: Former Smoker -- 0.25 packs/day for 6 years    Types: Cigarettes    Quit date: 08/27/1978  . Smokeless tobacco: Never Used  . Alcohol Use: No     Comment: "quit drinking in the 1980's", previously drank on the weekend   OB History    No data available      Review of Systems  Constitutional: Negative for fever.  Respiratory: Negative for shortness of breath.   Cardiovascular: Positive for leg swelling. Negative for chest pain and palpitations.  Gastrointestinal: Positive for nausea. Negative for vomiting.  Musculoskeletal: Positive for myalgias. Negative for joint swelling.  Neurological: Negative for syncope and weakness.       +R sided paresthesias  All other systems reviewed and are negative.   Allergies  Review of patient's allergies indicates no known allergies.  Home Medications   Prior to Admission medications    Medication Sig Start Date End Date Taking? Authorizing Provider  ALPRAZolam Duanne Moron) 0.5 MG tablet TAKE 1 TABLET BY MOUTH AS NEEDED FOR ANXIETY 05/07/14  Yes Vicie Mutters, [name]  baclofen (LIORESAL) 10 MG tablet Take 1 tablet (10 mg total) by mouth 2 (two) times daily. Patient taking differently: Take 10 mg by mouth at bedtime.  08/02/14  Yes Donika K Patel, DO  Eszopiclone 3 MG TABS Take 3 mg by mouth at bedtime as needed (sleep). Take immediately before bedtime   Yes Historical Provider, MD  fexofenadine (ALLEGRA) 30 MG tablet Take 1 tablet (30 mg total) by mouth 2 (two) times daily. 06/28/14  Yes Jennifer L Couillard, [name]  gabapentin (NEURONTIN) 600 MG tablet Take 600 mg by mouth 4 (four) times daily as needed. For pain or cramps   Yes Historical Provider, MD  hyoscyamine (LEVSIN SL) 0.125 MG SL tablet Place 1 tablet (0.125 mg total) under the tongue every 6 (six) hours as needed. Patient taking differently: Place 0.125 mg under the tongue every 6 (six) hours as needed for cramping.  04/11/14  Yes Willia Craze, NP  levothyroxine (SYNTHROID, LEVOTHROID) 100 MCG tablet Take 50-100 mcg by mouth daily before breakfast. Take 0.5 tab (2mg) on Monday, Wednesday, and Friday. Take 1 tab (1069m) on Sunday, Tuesday, Thursday, and Saturday.   Yes Historical Provider, MD  morphine (MS CONTIN) 15 MG 12 hr tablet Take 1 tablet (15 mg total) by mouth every 12 (twelve) hours. 06/21/14  Yes MoCurt BearsMD  Multiple Vitamin (MULITIVITAMIN WITH MINERALS) TABS Take 1 tablet by mouth every morning.    Yes Historical Provider, MD  ondansetron (ZOFRAN) 8 MG tablet Take 1/2 to 1  tablet 3 x day only if needed for severe nausea or vomiting Patient taking differently: Take 8 mg by mouth every 8 (eight) hours as needed for nausea.  08/03/14 10/02/14 Yes WiUnk PintoMD  pantoprazole (PROTONIX) 40 MG tablet Take 1 tablet (40 mg total) by mouth 2 (two) times daily. 04/26/14  Yes WiUnk PintoMD  pomalidomide  (POMALYST) 4 MG capsule Take 1 capsule (4 mg total) by mouth daily. Take with water on days 1-21. Repeat every 28 days. 08/04/14  Yes MoCurt BearsMD  atorvastatin (LIPITOR) 40 MG tablet TAKE 1 TABLET (40 MG TOTAL) BY MOUTH DAILY. 07/31/14   WiUnk PintoMD  baclofen (LIORESAL) 10 MG tablet START TAKING ONE TABLET AT BEDTIME X 1 WEEK, THEN INCREASE TO ONE TABLET TWICE DAILY 08/03/14   Donika K Keith RakeDO  cyclobenzaprine (FLEXERIL) 10 MG tablet Take 1/2-1 tablet TID PRN for muscle spasms Patient taking differently: Take 5-10 mg by mouth 3 (three) times daily as needed.  06/28/14   Jennifer L Couillard, [name]  dexamethasone (DECADRON) 4 MG tablet TAKE 10 TABLETS BY MOUTH EVERY FRIDAY 07/26/14   MoCurt BearsMD  furosemide (LASIX) 40 MG tablet TAKE 1 TABLET BY MOUTH TWICE A DAY FOR FLUID AND SWELLING 02/07/14   WiUnk PintoMD  gabapentin (NEURONTIN) 600 MG tablet TAKE 1 TABLET 4 X DAILY AS NEEDED FOR PAIN OR CRAMPS 07/31/14   Unk Pinto, MD  HYDROcodone-acetaminophen (NORCO) 7.5-325 MG per tablet Take 1 tablet by mouth every 6 (six) hours as needed for moderate pain. 06/21/14   Curt Bears, MD  KLOR-CON M10 10 MEQ tablet TAKE 1 TABLET BY MOUTH TWICE A DAY 07/31/14   Unk Pinto, MD  ondansetron (ZOFRAN ODT) 8 MG disintegrating tablet Take 1 tablet (8 mg total) by mouth every 8 (eight) hours as needed for nausea or vomiting. 06/28/14   Vicie Mutters, [name]  promethazine-dextromethorphan (PROMETHAZINE-DM) 6.25-15 MG/5ML syrup Take 1 teaspoon 4 x day ONLY if needed for severe cough 08/02/14   Unk Pinto, MD   BP 115/60 mmHg  Pulse 65  Temp(Src) 98 F (36.7 C) (Oral)  Resp 16  SpO2 100%   Physical Exam  Constitutional: She is oriented to person, place, and time. She appears well-developed and well-nourished. No distress.  Nontoxic/nonseptic appearing  HENT:  Head: Normocephalic and atraumatic.  Mouth/Throat: Oropharynx is clear and moist. No oropharyngeal exudate.  Eyes:  Conjunctivae and EOM are normal. Pupils are equal, round, and reactive to light. No scleral icterus.  Neck: Normal range of motion.  Cardiovascular: Normal rate, regular rhythm and intact distal pulses.   DP and PT pulses 2+ bilaterally  Pulmonary/Chest: Effort normal. No respiratory distress.  Respirations even and unlabored  Musculoskeletal: Normal range of motion.  Neurological: She is alert and oriented to person, place, and time. No cranial nerve deficit. She exhibits normal muscle tone. Coordination normal.  GCS 15. Speech is goal oriented. No focal neurologic deficits appreciated. Patient moves extremities without ataxia.  Skin: Skin is warm and dry. No rash noted. She is not diaphoretic. No erythema. No pallor.  Psychiatric: She has a normal mood and affect. Her behavior is normal.  Nursing note and vitals reviewed.   ED Course  Procedures (including critical care time) Labs Review Labs Reviewed  CBC WITH DIFFERENTIAL/PLATELET - Abnormal; Notable for the following:    RBC 3.02 (*)    Hemoglobin 9.6 (*)    HCT 29.9 (*)    RDW 16.1 (*)    Neutrophils Relative % 85 (*)    Monocytes Relative 2 (*)    All other components within normal limits  COMPREHENSIVE METABOLIC PANEL - Abnormal; Notable for the following:    Glucose, Bld 206 (*)    Creatinine, Ser 1.14 (*)    Calcium 8.0 (*)    Albumin 3.3 (*)    GFR calc non Af Amer 50 (*)    GFR calc Af Amer 58 (*)    All other components within normal limits  URINALYSIS, ROUTINE W REFLEX MICROSCOPIC - Abnormal; Notable for the following:    Ketones, ur 15 (*)    All other components within normal limits  BRAIN NATRIURETIC PEPTIDE  TROPONIN I    Imaging Review No results found.   EKG Interpretation None      MDM   Final diagnoses:  Dependent edema    Patient is a 64 year old female who presents to the emergency department for further evaluation of leg swelling. Patient is a very poor historian. She endorses  progression of symptoms over the past 2 weeks. Patient neurovascularly intact on exam. Exam findings consistent with dependent edema. Her laboratory workup does not suggest fluid overload; normal BNP. Rest of labs c/w prior work up. Prior ultrasound of left lower extremity reviewed which shows no evidence of  DVT.  Symptoms likely secondary to dependent edema. Possible that symptoms may be worsened by some of patient's chemotherapy medications; she was recently changed to oral chemotherapy tablets. Have recommended the patient follow-up with her primary care provider for further evaluation of her symptoms on an outpatient basis. She is stable for discharge at this time. No indication for further emergent workup. Patient seen also by my attending, Dr. Florina Ou who is in agreement with this workup, assessment, management plan, and patient's stability for discharge.   Filed Vitals:   08/05/14 2345 08/06/14 0449 08/06/14 0451  BP: 121/65  115/60  Pulse: 65    Temp: 98.5 F (36.9 C) 98 F (36.7 C)   TempSrc: Oral Oral   Resp: 18 16   SpO2: 100%       Antonietta Breach, [name] 08/06/14 8648  Wynetta Fines, MD 08/06/14 [phone]

## 2014-08-06 NOTE — Discharge Instructions (Signed)
Peripheral Edema °You have swelling in your legs (peripheral edema). This swelling is due to excess accumulation of salt and water in your body. Edema may be a sign of heart, kidney or liver disease, or a side effect of a medication. It may also be due to problems in the leg veins. Elevating your legs and using special support stockings may be very helpful, if the cause of the swelling is due to poor venous circulation. Avoid long periods of standing, whatever the cause. °Treatment of edema depends on identifying the cause. Chips, pretzels, pickles and other salty foods should be avoided. Restricting salt in your diet is almost always needed. Water pills (diuretics) are often used to remove the excess salt and water from your body via urine. These medicines prevent the kidney from reabsorbing sodium. This increases urine flow. °Diuretic treatment may also result in lowering of potassium levels in your body. Potassium supplements may be needed if you have to use diuretics daily. Daily weights can help you keep track of your progress in clearing your edema. You should call your caregiver for follow up care as recommended. °SEEK IMMEDIATE MEDICAL CARE IF:  °· You have increased swelling, pain, redness, or heat in your legs. °· You develop shortness of breath, especially when lying down. °· You develop chest or abdominal pain, weakness, or fainting. °· You have a fever. °Document Released: 07/25/2004 Document Revised: 09/09/2011 Document Reviewed: 07/05/2009 °ExitCare® Patient Information ©2015 [employer]. This information is not intended to replace advice given to you by your health care provider. Make sure you discuss any questions you have with your health care provider. ° °

## 2014-08-08 ENCOUNTER — Ambulatory Visit (INDEPENDENT_AMBULATORY_CARE_PROVIDER_SITE_OTHER): Payer: Medicare Other | Admitting: Emergency Medicine

## 2014-08-08 ENCOUNTER — Encounter: Payer: Self-pay | Admitting: Emergency Medicine

## 2014-08-08 VITALS — BP 128/64 | HR 78 | Temp 98.2°F | Resp 16 | Ht 64.75 in | Wt 200.0 lb

## 2014-08-08 DIAGNOSIS — Z0000 Encounter for general adult medical examination without abnormal findings: Secondary | ICD-10-CM | POA: Diagnosis not present

## 2014-08-08 DIAGNOSIS — R5383 Other fatigue: Secondary | ICD-10-CM

## 2014-08-08 DIAGNOSIS — R739 Hyperglycemia, unspecified: Secondary | ICD-10-CM

## 2014-08-08 DIAGNOSIS — E559 Vitamin D deficiency, unspecified: Secondary | ICD-10-CM | POA: Diagnosis not present

## 2014-08-08 DIAGNOSIS — E039 Hypothyroidism, unspecified: Secondary | ICD-10-CM

## 2014-08-08 DIAGNOSIS — E782 Mixed hyperlipidemia: Secondary | ICD-10-CM | POA: Diagnosis not present

## 2014-08-08 DIAGNOSIS — R5381 Other malaise: Secondary | ICD-10-CM | POA: Diagnosis not present

## 2014-08-08 DIAGNOSIS — R899 Unspecified abnormal finding in specimens from other organs, systems and tissues: Secondary | ICD-10-CM | POA: Diagnosis not present

## 2014-08-08 DIAGNOSIS — R609 Edema, unspecified: Secondary | ICD-10-CM

## 2014-08-08 NOTE — Patient Instructions (Signed)
Edema  Edema is an abnormal buildup of fluids. It is more common in your legs and thighs. Painless swelling of the feet and ankles is more likely as a person ages. It also is common in looser skin, like around your eyes.  HOME CARE   · Keep the affected body part above the level of the heart while lying down.  · Do not sit still or stand for a long time.  · Do not put anything right under your knees when you lie down.  · Do not wear tight clothes on your upper legs.  · Exercise your legs to help the puffiness (swelling) go down.  · Wear elastic bandages or support stockings as told by your doctor.  · A low-salt diet may help lessen the puffiness.  · Only take medicine as told by your doctor.  GET HELP IF:  · Treatment is not working.  · You have heart, liver, or kidney disease and notice that your skin looks puffy or shiny.  · You have puffiness in your legs that does not get better when you raise your legs.  · You have sudden weight gain for no reason.  GET HELP RIGHT AWAY IF:   · You have shortness of breath or chest pain.  · You cannot breathe when you lie down.  · You have pain, redness, or warmth in the areas that are puffy.  · You have heart, liver, or kidney disease and get edema all of a sudden.  · You have a fever and your symptoms get worse all of a sudden.  MAKE SURE YOU:   · Understand these instructions.  · Will watch your condition.  · Will get help right away if you are not doing well or get worse.  Document Released: 12/04/2007 Document Revised: 06/22/2013 Document Reviewed: 04/09/2013  ExitCare® Patient Information ©2015 [employer]. This information is not intended to replace advice given to you by your health care provider. Make sure you discuss any questions you have with your health care provider.

## 2014-08-08 NOTE — Progress Notes (Signed)
Subjective:    Patient ID: [name], female    DOB: 1950-07-20, 64 y.o.   MRN: [id number]  HPI Comments: 64 yo pleasant AAF CPE and presents for 3 month F/U for Cholesterol, Pre-Dm, D. Deficient. She was recently treated in ER for bilateral LE edema.  She had NEG Cardiac evaluation in ER for CHF and was directed to restart Lasix. She had recent bilateral dopplers both were NEG. She denies any cardiac symptoms currently but notes swelling occurs every 2-3 weeks with her ankles becoming "tree trunks" making it difficulty to walk. She denies any triggers but is currently in Chemo again for Multiple Myeloma. She has not been exercising routinely but keeps active. She is eating healthy for the most part but keeps decreased appetite.   Chronic constipation/ diarrhea despite dietary changes. She notes it changes on routine basis without warning and she does not think it is in relationship to food or Chemo. She had recent EGD in 2015 that was WNL per patient but we do not have records.  She notes recurrent left side weakness/ tingling/ edema since stroke in 2014. She notes sensations come/ go without trigger and can last from minutes to hours and resolve without treatment.She has chronic feet numbness/ heaviness since Chemo with Multiple myeloma. She has Neuro appointment on FRI to further discuss.  IMPRESSION: 7/ 2015 Cervical MRI 1. Left central and foraminal disease at C5-6 and C6-7 results in moderate left lateral recess and foraminal stenosis at both levels. There may be some progression at C6-7. There is no significant change at C5-6. 2. Mild right foraminal narrowing at C5-6 and C6-7. 3. Stable mild left foraminal narrowing at C4-5. 4. Mild facet hypertrophy at C3-4 without significant stenosis.  She is overdue for bone densitometry but had recent bone scan and recieves Infusions Q 3 months for her bones thru the Cancer center.  Lab Results      Component                Value                Date                      WBC                      6.3                 08/06/2014                HGB                      9.6*                08/06/2014                HCT                      29.9*               08/06/2014                PLT                      254                 08/06/2014  GLUCOSE                  206*                08/06/2014                CHOL                     107                 05/10/2014                TRIG                     99                  05/10/2014                HDL                      62                  05/10/2014                LDLCALC                  25                  05/10/2014                ALT                      35                  08/06/2014                AST                      27                  08/06/2014                NA                       138                 08/06/2014                K                        3.7                 08/06/2014                CL                       106                 08/06/2014                CREATININE               1.14*               08/06/2014                BUN  17                  08/06/2014                CO2                      24                  08/06/2014                TSH                      12.841*             05/10/2014                INR                      1.07                06/26/2011                HGBA1C                   6.4*                05/10/2014                MICROALBUR               0.50                07/22/2013               Medication List       This list is accurate as of: 08/08/14 11:59 PM.  Always use your most recent med list.               ALPRAZolam 0.5 MG tablet  Commonly known as:  XANAX  TAKE 1 TABLET BY MOUTH AS NEEDED FOR ANXIETY     baclofen 10 MG tablet  Commonly known as:  LIORESAL  Take 1 tablet (10 mg total) by mouth 2 (two) times daily.     cyclobenzaprine 10 MG tablet  Commonly known as:   FLEXERIL  Take 1/2-1 tablet TID PRN for muscle spasms     dexamethasone 4 MG tablet  Commonly known as:  DECADRON  TAKE 10 TABLETS BY MOUTH EVERY FRIDAY     Eszopiclone 3 MG Tabs  Take 3 mg by mouth at bedtime as needed (sleep). Take immediately before bedtime     fexofenadine 30 MG tablet  Commonly known as:  ALLEGRA  Take 1 tablet (30 mg total) by mouth 2 (two) times daily.     furosemide 40 MG tablet  Commonly known as:  LASIX  TAKE 1 TABLET BY MOUTH TWICE A DAY FOR FLUID AND SWELLING     gabapentin 600 MG tablet  Commonly known as:  NEURONTIN  Take 600 mg by mouth 4 (four) times daily as needed. For pain or cramps     gabapentin 600 MG tablet  Commonly known as:  NEURONTIN  TAKE 1 TABLET 4 X DAILY AS NEEDED FOR PAIN OR CRAMPS     HYDROcodone-acetaminophen 7.5-325 MG per tablet  Commonly known as:  NORCO  Take 1 tablet by mouth every 6 (six) hours as needed for moderate pain.     hyoscyamine 0.125 MG SL tablet  Commonly known  asErick Alley  Place 1 tablet (0.125 mg total) under the tongue every 6 (six) hours as needed.     KLOR-CON M10 10 MEQ tablet  Generic drug:  potassium chloride  TAKE 1 TABLET BY MOUTH TWICE A DAY     levothyroxine 100 MCG tablet  Commonly known as:  SYNTHROID, LEVOTHROID  Take 50-100 mcg by mouth daily before breakfast. Take 0.5 tab (83mg) on Monday, Wednesday, and Friday. Take 1 tab (1055m) on Sunday, Tuesday, Thursday, and Saturday.     morphine 15 MG 12 hr tablet  Commonly known as:  MS CONTIN  Take 1 tablet (15 mg total) by mouth every 12 (twelve) hours.     multivitamin with minerals Tabs tablet  Take 1 tablet by mouth every morning.     ondansetron 8 MG disintegrating tablet  Commonly known as:  ZOFRAN ODT  Take 1 tablet (8 mg total) by mouth every 8 (eight) hours as needed for nausea or vomiting.     ondansetron 8 MG tablet  Commonly known as:  ZOFRAN  Take 1/2 to 1  tablet 3 x day only if needed for severe nausea or vomiting      pantoprazole 40 MG tablet  Commonly known as:  PROTONIX  Take 1 tablet (40 mg total) by mouth 2 (two) times daily.     pomalidomide 4 MG capsule  Commonly known as:  POMALYST  Take 1 capsule (4 mg total) by mouth daily. Take with water on days 1-21. Repeat every 28 days.     promethazine-dextromethorphan 6.25-15 MG/5ML syrup  Commonly known as:  PROMETHAZINE-DM  Take 1 teaspoon 4 x day ONLY if needed for severe cough       No Known Allergies Past Medical History  Diagnosis Date  . Hypercholesterolemia   . Compression fracture 04/08/2007    pathologic compression fracture  . Hypothyroidism   . FHx: chemotherapy     s/p 5 cycle revlimid/low dose decadron,s/p velcade,doxil,decadron,  . Hx of radiation therapy 05/05/07-05/18/07,& 03/05/11-03/21/11-    l3&l5 in 2008, t2-t6 03/2011  . GERD (gastroesophageal reflux disease)   . Insomnia     associated with steroids  . Constipation     takes oxycontin,vicodin  . Hx of radiation therapy 05/05/2007 to 05/18/2007    palliative, L3-5  . Hx of radiation therapy 03/05/2011 to 03/21/2011    palliative T2-T6, c-spine  . 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 PONV (postoperative nausea and vomiting)   . Multiple myeloma dx'd 2009  . Metastasis to bone   . Family history of anesthesia complication     "daughter gets PONV too"  . Pneumonia     "several times"  . Stroke 2014    denies residual on 01/27/2014   Past Surgical History  Procedure Laterality Date  . Knee arthroscopy Right     "put pin in"  . Knee arthroscopy Right     "took pin out and corrected what was wrong"  . Video bronchoscopy  07/30/2011    Procedure: VIDEO BRONCHOSCOPY WITHOUT FLUORO;  Surgeon: KeKathee DeltonMD;  [facility];  Service: Cardiopulmonary;  Laterality: Bilateral;  . Cholecystectomy  1980's  . Portacath placement Right 2009  . Vaginal hysterectomy  1980's   History   Social History  . Marital Status:  Married    Spouse Name: N/A  . Number of Children: N/A  . Years of Education: N/A   Social History  Main Topics  . Smoking status: Former Smoker -- 0.25 packs/day for 6 years    Types: Cigarettes    Quit date: 08/27/1978  . Smokeless tobacco: Never Used  . Alcohol Use: No     Comment: "quit drinking in the 1980's", previously drank on the weekend  . Drug Use: No  . Sexual Activity: Not Currently   Other Topics Concern  . None   Social History Narrative   She lives with alone.  She has two grown children.   She is on disability, started in 2010.  Previously working at [employer].   Highest level of education:  12th grade   Family History  Problem Relation Age of Onset  . Lung cancer Brother   . Colon cancer Maternal Uncle   . Breast cancer Maternal Grandmother     MAINTENANCE: Colonoscopy:2013 EGD- 2015  Mammo:11/16/2013 JXB:JYNWGNF but BONE SCAN 09/2013 stable Pap/ Pelvic:2014 Manual WNL EYE: Glasses 8/ 2015 WNL Dentist: Q 3-6 month EKG:12/2013 ECHO: EF 60-65% 12/2013 Carotid: 12/2013  IMMUNIZATIONS: Tdap:2010 Pneumovax:2010 will discuss w/ mohammed Zostavax: Will discuss w/ mohammed SHingles 2015 Influenza: 03/2014  Patient Care Team: Unk Pinto, MD as PCP - General (Internal Medicine) Curt Bears, MD as Consulting Physician (Oncology) Lelon Perla, MD as Consulting Physician (Cardiology) Wonda Horner, MD as Consulting Physician (Gastroenterology) Marybelle Killings, MD as Consulting Physician (Orthopedic Surgery) Jodelle Gross, MD as Consulting Physician (Radiation Oncology) Arnoldo Hooker (Hematology and Oncology) Winfield Cunas., MD as Consulting Physician (Gastroenterology) Marybelle Killings, MD as Consulting Physician (Orthopedic Surgery) Alda Berthold, DO as Consulting Physician (Neurology) Diane Dalphine Handing (Optometry) Milus Banister, MD as Attending Physician (Gastroenterology)   Review of Systems  Constitutional: Positive for fatigue.   Respiratory: Negative for shortness of breath.   Cardiovascular: Positive for leg swelling. Negative for chest pain.  Gastrointestinal: Positive for diarrhea and constipation.  Neurological: Positive for numbness.  Psychiatric/Behavioral: Negative for suicidal ideas.  All other systems reviewed and are negative.  BP 128/64 mmHg  Pulse 78  Temp(Src) 98.2 F (36.8 C) (Temporal)  Resp 16  Ht 5' 4.75" (1.645 m)  Wt 200 lb (90.719 kg)  BMI 33.52 kg/m2  SpO2 99%     Objective:   Physical Exam  Constitutional: She is oriented to person, place, and time. She appears well-developed and well-nourished. No distress.  HENT:  Head: Normocephalic.  Nose: Nose normal.  Mouth/Throat: Oropharynx is clear and moist.  Eyes: Conjunctivae and EOM are normal. Pupils are equal, round, and reactive to light. No scleral icterus.  Neck: Normal range of motion. Neck supple. No JVD present. No tracheal deviation present. No thyromegaly present.  Cardiovascular: Normal rate, regular rhythm, normal heart sounds and intact distal pulses.   L>R pitting 1+ LE edema  Pulmonary/Chest: Effort normal and breath sounds normal.  Abdominal: Soft. Bowel sounds are normal. She exhibits no distension and no mass. There is no tenderness.  Genitourinary:  Def 2017  Musculoskeletal: Normal range of motion. She exhibits no edema or tenderness.  Lymphadenopathy:    She has no cervical adenopathy.  Neurological: She is alert and oriented to person, place, and time. She has normal reflexes. She exhibits normal muscle tone. Coordination normal.  Mild decreased sensation bilateral edges of feet  Skin: Skin is warm and dry. No rash noted. No erythema.  Psychiatric: She has a normal mood and affect. Her behavior is normal. Judgment and thought content normal.  Nursing note and vitals reviewed.  Recent EKG reviewed, Recent ER Cardiac workup NEG      Assessment & Plan:  1. CPE- Update screening labs/ History/  Immunizations/ Testing as needed. Advised healthy diet, QD exercise, increase H20 and continue RX/ Vitamins AD.  2. 3 month F/U for Cholesterol, Pre-Dm, D. Deficient. Needs healthy diet, cardio QD and obtain healthy weight. Check Labs, Check BP if >130/80 call office   3. Fatigue with cancer vs Hypothyroid vs recent abnormal labs- Recheck labs, increase activity/ exercise level  4. Edema- elevate legs TID, increase activity, increase H2o, decrease sodium intake, Wear compression socks more routinely if available. W/c if no change with symptoms. Check labs/ reviewed ER visit/ labs. REF Cardio with CHEMO hx and new increase in Edema  5. Multiple myeloma in Chemo currently with numerous compression fractures and neuro changes/ deficits- Keep appointment with [facility] Old on FRI, continue Oncology f/u AD. 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 Numbness of left side ? Concern for mini strokes with 2014 stroke HX vs complications from cervical changes with MM and degenerative changes vs Chemo SE- advise keep Neuro f/u  7.Constipation/ diarrhea- advise add probiotic if symptoms continue needs GI f/u

## 2014-08-09 ENCOUNTER — Ambulatory Visit (INDEPENDENT_AMBULATORY_CARE_PROVIDER_SITE_OTHER): Payer: Medicare Other | Admitting: Neurology

## 2014-08-09 ENCOUNTER — Encounter: Payer: Self-pay | Admitting: Neurology

## 2014-08-09 VITALS — BP 94/64 | HR 84 | Ht 66.0 in | Wt 201.0 lb

## 2014-08-09 DIAGNOSIS — G622 Polyneuropathy due to other toxic agents: Secondary | ICD-10-CM | POA: Diagnosis not present

## 2014-08-09 DIAGNOSIS — M542 Cervicalgia: Secondary | ICD-10-CM | POA: Diagnosis not present

## 2014-08-09 DIAGNOSIS — G620 Drug-induced polyneuropathy: Secondary | ICD-10-CM

## 2014-08-09 DIAGNOSIS — T451X5A Adverse effect of antineoplastic and immunosuppressive drugs, initial encounter: Secondary | ICD-10-CM

## 2014-08-09 LAB — BASIC METABOLIC PANEL WITH GFR
BUN: 12 mg/dL (ref 6–23)
CHLORIDE: 106 meq/L (ref 96–112)
CO2: 27 mEq/L (ref 19–32)
CREATININE: 0.84 mg/dL (ref 0.50–1.10)
Calcium: 8.2 mg/dL — ABNORMAL LOW (ref 8.4–10.5)
GFR, EST AFRICAN AMERICAN: 86 mL/min
GFR, EST NON AFRICAN AMERICAN: 74 mL/min
Glucose, Bld: 78 mg/dL (ref 70–99)
POTASSIUM: 3.7 meq/L (ref 3.5–5.3)
Sodium: 143 mEq/L (ref 135–145)

## 2014-08-09 LAB — INSULIN, FASTING: Insulin fasting, serum: 9.6 u[IU]/mL (ref 2.0–19.6)

## 2014-08-09 LAB — CBC WITH DIFFERENTIAL/PLATELET
Basophils Absolute: 0 10*3/uL (ref 0.0–0.1)
Basophils Relative: 1 % (ref 0–1)
EOS PCT: 7 % — AB (ref 0–5)
Eosinophils Absolute: 0.3 10*3/uL (ref 0.0–0.7)
HCT: 34.9 % — ABNORMAL LOW (ref 36.0–46.0)
Hemoglobin: 11.2 g/dL — ABNORMAL LOW (ref 12.0–15.0)
Lymphocytes Relative: 27 % (ref 12–46)
Lymphs Abs: 1 10*3/uL (ref 0.7–4.0)
MCH: 31.9 pg (ref 26.0–34.0)
MCHC: 32.1 g/dL (ref 30.0–36.0)
MCV: 99.4 fL (ref 78.0–100.0)
MONO ABS: 0.4 10*3/uL (ref 0.1–1.0)
MPV: 9.1 fL (ref 8.6–12.4)
Monocytes Relative: 11 % (ref 3–12)
Neutro Abs: 1.9 10*3/uL (ref 1.7–7.7)
Neutrophils Relative %: 54 % (ref 43–77)
PLATELETS: 249 10*3/uL (ref 150–400)
RBC: 3.51 MIL/uL — ABNORMAL LOW (ref 3.87–5.11)
RDW: 17.3 % — ABNORMAL HIGH (ref 11.5–15.5)
WBC: 3.6 10*3/uL — ABNORMAL LOW (ref 4.0–10.5)

## 2014-08-09 LAB — LIPID PANEL
Cholesterol: 144 mg/dL (ref 0–200)
HDL: 59 mg/dL (ref >39)
LDL Cholesterol: 57 mg/dL (ref 0–99)
Total CHOL/HDL Ratio: 2.4 Ratio
Triglycerides: 139 mg/dL (ref <150)
VLDL: 28 mg/dL (ref 0–40)

## 2014-08-09 LAB — HEMOGLOBIN A1C
Hgb A1c MFr Bld: 5.9 % — ABNORMAL HIGH (ref <5.7)
Mean Plasma Glucose: 123 mg/dL — ABNORMAL HIGH (ref <117)

## 2014-08-09 LAB — HEPATIC FUNCTION PANEL
ALT: 20 U/L (ref 0–35)
AST: 10 U/L (ref 0–37)
Albumin: 3.8 g/dL (ref 3.5–5.2)
Alkaline Phosphatase: 72 U/L (ref 39–117)
Bilirubin, Direct: 0.1 mg/dL (ref 0.0–0.3)
Indirect Bilirubin: 0.4 mg/dL (ref 0.2–1.2)
TOTAL PROTEIN: 6 g/dL (ref 6.0–8.3)
Total Bilirubin: 0.5 mg/dL (ref 0.2–1.2)

## 2014-08-09 LAB — MICROALBUMIN / CREATININE URINE RATIO
Creatinine, Urine: 68.2 mg/dL
Microalb Creat Ratio: 2.9 mg/g (ref 0.0–30.0)
Microalb, Ur: 0.2 mg/dL (ref <2.0)

## 2014-08-09 LAB — MAGNESIUM: Magnesium: 1.5 mg/dL (ref 1.5–2.5)

## 2014-08-09 LAB — TSH: TSH: 5.146 u[IU]/mL — AB (ref 0.350–4.500)

## 2014-08-09 LAB — VITAMIN D 25 HYDROXY (VIT D DEFICIENCY, FRACTURES): VIT D 25 HYDROXY: 49 ng/mL (ref 30–100)

## 2014-08-09 NOTE — Progress Notes (Signed)
Follow-up Visit   Date: 08/09/2014    [name] MRN: [id number] DOB: 09/24/1950   Interim History: [name] is a 64 y.o. right-handed African American female with history of recurrent multiple myeloma (diagnosed 2008) undergoing chemotherapy and radiation, pre-diabetes, hyperlipidemia, hypothyroidism, GERD, left frontal stroke (2014, no residual symptoms) and neuropathy returning to the clinic for follow-up of cervicalgia and neuropathy.  The patient was accompanied to the clinic by daughter and grand-daughter who also provides collateral information.    History of present illness: She reports having left sided neck pain since summer 2014, which was intermittent but worsened in July 2015. It is improved if she supports her neck and worse with neck pain. Pain is described as squeezing pain. She has associated tingling of the arm. On January 27, 2014, she presented to ED with paresthesias of the left arm, axilla, neck, and tongue. She has chronic paresthesias of the hands and feet from neuropathy, but these symptoms were different such that it involved greater area. She also had weakness of the right leg which lasted 25-30 minutes, but it slowly improved. MRI/A brain did not show any acute stroke, but stable appearance of large boney lesion involving the right parietal skull with extension to the underlying dura measuring 3.9 x 1.1 cm. Small lesions in the left parietal bone are also unchanged. Lesion in the C2 vertebral body is also grossly unchanged. Imaging of the cervical spine shows multilevel foraminal narrowing, worse at C5-6 and C6-7 on the left. She was recommended to follow-up with neurology.  She has been having ongoing problem with numbness/tingling of the hands and feet, worse on the left since starting chemotherapy in 2011 with Velcade. Her chemotherapy was stopped, but there was no improvement of paresthesias. She is taking neurontin 631m four times daily  which helps alleviate the pain. Heat and epson salt soaks also helps her discomfort. Paresthesias mostly involve her lower legs. She has not been falling, but does report to dropping things.   She also complains of generalized muscle cramps. Flexeril makes her too sleepy. She is taking Magnesium 5069mdaily.   She initially was diagnosed in November 2008 for which she completed chemotherapy with Revlimid [facility] was treated with Velcade, Doxil, and decadron with good response, but Velcade was discontinued due to worsening peripheral neuropathy. In 2014, she was started on chemotherapy with Carfilzomib but this was stopped due to cardiac dysfunction. Most recently in June 2015, Pomalyst and Zometa was started.   She takes a number of pain medications including MS contin 159mID and Norco 7.5mg56mroughout the day as needed.   UPDATE 08/09/2014:  Since starting baclofen 10mg55m and gabapentin 600mg 30m she did notice some improvement, such that the intensity is less and cramps are improved, but overall, symptoms still persist.  She is most bothered by her ongoing left sided neck pain and when severe takes flexeril which allows her to at least rest.  No new complaints.    Medications:  Current Outpatient Prescriptions on File Prior to Visit  Medication Sig Dispense Refill  . ALPRAZolam (XANAX) 0.5 MG tablet TAKE 1 TABLET BY MOUTH AS NEEDED FOR ANXIETY 30 tablet 3  . baclofen (LIORESAL) 10 MG tablet Take 1 tablet (10 mg total) by mouth 2 (two) times daily. 60 tablet 3  . cyclobenzaprine (FLEXERIL) 10 MG tablet Take 1/2-1 tablet TID PRN for muscle spasms (Patient taking differently: Take 5-10 mg  by mouth 3 (three) times daily as needed. ) 90 tablet 0  . dexamethasone (DECADRON) 4 MG tablet TAKE 10 TABLETS BY MOUTH EVERY FRIDAY 40 tablet 1  . Eszopiclone 3 MG TABS Take 3 mg by mouth at bedtime as needed (sleep). Take immediately before bedtime     . fexofenadine (ALLEGRA) 30 MG tablet Take 1 tablet (30 mg total) by mouth 2 (two) times daily. 30 tablet 1  . furosemide (LASIX) 40 MG tablet TAKE 1 TABLET BY MOUTH TWICE A DAY FOR FLUID AND SWELLING 60 tablet 10  . gabapentin (NEURONTIN) 600 MG tablet TAKE 1 TABLET 4 X DAILY AS NEEDED FOR PAIN OR CRAMPS 120 tablet 6  . gabapentin (NEURONTIN) 600 MG tablet Take 600 mg by mouth 4 (four) times daily as needed. For pain or cramps    . HYDROcodone-acetaminophen (NORCO) 7.5-325 MG per tablet Take 1 tablet by mouth every 6 (six) hours as needed for moderate pain. 30 tablet 0  . hyoscyamine (LEVSIN SL) 0.125 MG SL tablet Place 1 tablet (0.125 mg total) under the tongue every 6 (six) hours as needed. (Patient taking differently: Place 0.125 mg under the tongue every 6 (six) hours as needed for cramping. ) 30 tablet 4  . KLOR-CON M10 10 MEQ tablet TAKE 1 TABLET BY MOUTH TWICE A DAY 60 tablet 3  . levothyroxine (SYNTHROID, LEVOTHROID) 100 MCG tablet Take 50-100 mcg by mouth daily before breakfast. Take 0.5 tab (77mg) on Monday, Wednesday, and Friday. Take 1 tab (1047m) on Sunday, Tuesday, Thursday, and Saturday.    . morphine (MS CONTIN) 15 MG 12 hr tablet Take 1 tablet (15 mg total) by mouth every 12 (twelve) hours. 60 tablet 0  . Multiple Vitamin (MULITIVITAMIN WITH MINERALS) TABS Take 1 tablet by mouth every morning.     . ondansetron (ZOFRAN ODT) 8 MG disintegrating tablet Take 1 tablet (8 mg total) by mouth every 8 (eight) hours as needed for nausea or vomiting. 60 tablet 1  . ondansetron (ZOFRAN) 8 MG tablet Take 1/2 to 1  tablet 3 x day only if needed for severe nausea or vomiting (Patient taking differently: Take 8 mg by mouth every 8 (eight) hours as needed for nausea. ) 30 tablet 1  . pantoprazole (PROTONIX) 40 MG tablet Take 1 tablet (40 mg total) by mouth 2 (two) times daily. 60 tablet 3  . pomalidomide (POMALYST) 4 MG capsule Take 1 capsule (4 mg total) by mouth daily. Take with water on days  1-21. Repeat every 28 days. 21 capsule 0  . promethazine-dextromethorphan (PROMETHAZINE-DM) 6.25-15 MG/5ML syrup Take 1 teaspoon 4 x day ONLY if needed for severe cough 240 mL 0   No current facility-administered medications on file prior to visit.    Allergies: No Known Allergies  Review of Systems:  CONSTITUTIONAL: No fevers, chills, night sweats, or weight loss.  EYES: No visual changes or eye pain ENT: No hearing changes.  No history of nose bleeds.   RESPIRATORY: No cough, wheezing and shortness of breath.   CARDIOVASCULAR: Negative for chest pain, and palpitations.   GI: Negative for abdominal discomfort, blood in stools or black stools.  No recent change in bowel habits.   GU:  No history of incontinence.   MUSCLOSKELETAL: +history of joint pain or swelling.  +myalgias.   SKIN: Negative for lesions, rash, and itching.   ENDOCRINE: Negative for cold or heat intolerance, polydipsia or goiter.   PSYCH:  No depression or anxiety symptoms.   NEURO: As  Above.   Vital Signs:  BP 94/64 mmHg  Pulse 84  Ht 5' 6"  (1.676 m)  Wt 201 lb (91.173 kg)  BMI 32.46 kg/m2  SpO2 98%   Neurological Exam: MENTAL STATUS including orientation to time, place, person, recent and remote memory, attention span and concentration, language, and fund of knowledge is normal.  Speech is not dysarthric.  CRANIAL NERVES:  Pupils equal round and reactive to light.  Normal conjugate, extra-ocular eye movements in all directions of gaze.  No ptosis. Face is symmetric. Palate elevates symmetrically.  Tongue is midline.  MOTOR:  Motor strength is 5/5 in all extremities  No pronator drift.  Tone is normal.    MSRs:  Right                                                                 Left brachioradialis 2+  brachioradialis 2+  biceps 2+  biceps 2+  triceps 2+  triceps 2+  patellar 1+  Patellar 1+  ankle jerk 0  ankle jerk 0  Hoffman no  Hoffman no  plantar response down  plantar response down    SENSATION: vibration diminished distal to knees bilaterally.  COORDINATION/GAIT:  Gait narrow based and stable. Stressed and tandem gait intact.  Data: MRI/A brain 01/26/2014: 1. No evidence of acute intracranial abnormality. Mild chronic white matter changes.  2. Unchanged skull lesions, consistent with multiple myeloma, including a large right parietal lesion with involvement of the underlying dura.  3. No evidence of major intracranial arterial occlusion or proximal stenosis.  MRI cervical spine 03/21/2014: 1. Left central and foraminal disease at C5-6 and C6-7 results in moderate left lateral recess and foraminal stenosis at both levels. There may be some progression at C6-7. There is no significant change at C5-6.  2. Mild right foraminal narrowing at C5-6 and C6-7.  3. Stable mild left foraminal narrowing at C4-5.  4. Mild facet hypertrophy at C3-4 without significant stenosis.   MRI lumbar spine 01/11/2013: Sub-centimeter metastases within the right pedicle of L1, the vertebral body at L1, the right pedicle at L2, the central S2 segment and the left sided S2 segment. 2.5 cm metastasis anteriorly within the L2 vertebral body, at the site of the pathologic endplate Schmorl's node. No neural compression by tumor.  Degenerative changes which could be a cause of back pain. Bilateral facet arthropathy at L2-3. Lateral recess stenosis at L4-5 left worse than right. Left foraminal encroachment by small disc herniation at L2-3.   Labs 02/17/2014: TSH 2.213, HbA1c 6.0, CK 30, vitamin B12 803, HIV NR  Lab Results  Component Value Date   DHRCBULA45 364 06/28/2014   Lab Results  Component Value Date   TSH 5.146* 08/08/2014   Lab Results  Component Value Date   HGBA1C 5.9* 08/08/2014     IMPRESSION/PLAN: 1.  Left sided neck pain due to multilevel cervical foraminal stenosis  - We had lengthy discussion regarding management options including optimizing medication/PT, epidural  steroid injections, and lastly referral to surgery and decided to optimize her muscle relaxant and start neck PT  - Increase baclofen to 19m twice daily, OK to take flexeril 567mas needed for severe pain  - Start home neck physiotherapy  2.  Chemotherapy-induced neuropathy (Velcade)  -  EMG deferred by patient   - Continue gabapentin 660m four times daily  3.  Multiple myeloma undergoing chemotherapy, followed by Dr. MJulien Nordmann 4.  Return to clinic in 3 months   The duration of this appointment visit was 30 minutes of face-to-face time with the patient.  Greater than 50% of this time was spent in counseling, explanation of diagnosis, planning of further management, and coordination of care.   Thank you for allowing me to participate in patient's care.  If I can answer any additional questions, I would be pleased to do so.    Sincerely,    Donika K. PPosey Pronto DO

## 2014-08-09 NOTE — Patient Instructions (Addendum)
1.  Home physical therapy for neck pain 2.  Increase baclofen 10mg  twice daily 3.  Continue gabapentin 600mg  four times daily 4.  Return to clinic in 4 months

## 2014-08-11 ENCOUNTER — Encounter: Payer: Self-pay | Admitting: Emergency Medicine

## 2014-08-12 ENCOUNTER — Encounter: Payer: Self-pay | Admitting: Gastroenterology

## 2014-08-12 ENCOUNTER — Ambulatory Visit (INDEPENDENT_AMBULATORY_CARE_PROVIDER_SITE_OTHER): Payer: Medicare Other | Admitting: Gastroenterology

## 2014-08-12 VITALS — BP 118/70 | HR 76 | Ht 64.0 in | Wt 206.2 lb

## 2014-08-12 DIAGNOSIS — G8929 Other chronic pain: Secondary | ICD-10-CM

## 2014-08-12 DIAGNOSIS — R1013 Epigastric pain: Secondary | ICD-10-CM | POA: Diagnosis not present

## 2014-08-12 MED ORDER — HYOSCYAMINE SULFATE ER 0.375 MG PO TB12: 0.3750 mg | ORAL_TABLET | Freq: Two times a day (BID) | ORAL | Status: DC

## 2014-08-12 NOTE — Patient Instructions (Addendum)
Start twice daily antispasm medicine (called into your pharmacy). Please return to see Dr. Ardis Hughs in 2-3 months.  MM:[id number] [employer]

## 2014-08-12 NOTE — Progress Notes (Signed)
Review of pertinent gastrointestinal problems: 1. Chronic abdominal pains: CTscan and labs unremarkable in June 2015. EGD by [facility] in 2012 was normal. Colonoscopy by [facility] Nov 2013 (for abdominal pain ) was normal. Her weight is stable, abdominal exam is unremarkable. 2. Adenomatous polyps: Colonoscopy Dr. Ardis Hughs 2010 : small adenoma removed, also large external hemorrhoids noted.   HPI: This is a  pleasant 64 year old woman who is here with her son and father today.   Dosing better, she says.  She has been taking sublingual antispasm meds.  Periumbilical pain, lasts about 1-5 minutes.  SL levsin seems to help.  No NSAIDS.  Takes morphine twice daily.  Also takes vicodin periodically.  Her weight is up 13 pounds since 03/2014 (same scale her in GI office)  Labs 08/2014 CMET normal, cbc Hb 11, MCV 99   Past Medical History  Diagnosis Date  . Hypercholesterolemia   . Compression fracture 04/08/2007    pathologic compression fracture  . Hypothyroidism   . FHx: chemotherapy     s/p 5 cycle revlimid/low dose decadron,s/p velcade,doxil,decadron,  . Hx of radiation therapy 05/05/07-05/18/07,& 03/05/11-03/21/11-    l3&l5 in 2008, t2-t6 03/2011  . GERD (gastroesophageal reflux disease)   . Insomnia     associated with steroids  . Constipation     takes oxycontin,vicodin  . Hx of radiation therapy 05/05/2007 to 05/18/2007    palliative, L3-5  . Hx of radiation therapy 03/05/2011 to 03/21/2011    palliative T2-T6, c-spine  . 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 PONV (postoperative nausea and vomiting)   . Multiple myeloma dx'd 2009  . Metastasis to bone   . Family history of anesthesia complication     "daughter gets PONV too"  . Pneumonia     "several times"  . Stroke 2014    denies residual on 01/27/2014    Past Surgical History  Procedure Laterality Date  . Knee arthroscopy Right     "put pin in"  . Knee arthroscopy Right     "took pin  out and corrected what was wrong"  . Video bronchoscopy  07/30/2011    Procedure: VIDEO BRONCHOSCOPY WITHOUT FLUORO;  Surgeon: Kathee Delton, MD;  Location: [facility];  Service: Cardiopulmonary;  Laterality: Bilateral;  . Cholecystectomy  1980's  . Portacath placement Right 2009  . Vaginal hysterectomy  1980's    Current Outpatient Prescriptions  Medication Sig Dispense Refill  . ALPRAZolam (XANAX) 0.5 MG tablet TAKE 1 TABLET BY MOUTH AS NEEDED FOR ANXIETY 30 tablet 3  . aspirin 81 MG tablet Take 81 mg by mouth daily.    . baclofen (LIORESAL) 10 MG tablet Take 1 tablet (10 mg total) by mouth 2 (two) times daily. 60 tablet 3  . cyclobenzaprine (FLEXERIL) 10 MG tablet Take 1/2-1 tablet TID PRN for muscle spasms (Patient taking differently: Take 5-10 mg by mouth 3 (three) times daily as needed. ) 90 tablet 0  . dexamethasone (DECADRON) 4 MG tablet TAKE 10 TABLETS BY MOUTH EVERY FRIDAY 40 tablet 1  . Eszopiclone 3 MG TABS Take 3 mg by mouth at bedtime as needed (sleep). Take immediately before bedtime    . fexofenadine (ALLEGRA) 30 MG tablet Take 1 tablet (30 mg total) by mouth 2 (two) times daily. 30 tablet 1  . furosemide (LASIX) 40 MG tablet TAKE 1 TABLET BY MOUTH TWICE A DAY FOR FLUID AND SWELLING 60 tablet 10  . gabapentin (NEURONTIN) 600  MG tablet Take 600 mg by mouth 4 (four) times daily as needed. For pain or cramps    . HYDROcodone-acetaminophen (NORCO) 7.5-325 MG per tablet Take 1 tablet by mouth every 6 (six) hours as needed for moderate pain. 30 tablet 0  . hyoscyamine (LEVSIN SL) 0.125 MG SL tablet Place 1 tablet (0.125 mg total) under the tongue every 6 (six) hours as needed. (Patient taking differently: Place 0.125 mg under the tongue every 6 (six) hours as needed for cramping. ) 30 tablet 4  . KLOR-CON M10 10 MEQ tablet TAKE 1 TABLET BY MOUTH TWICE A DAY 60 tablet 3  . levothyroxine (SYNTHROID, LEVOTHROID) 100 MCG tablet Take 50-100 mcg by mouth daily before breakfast. Take 0.5  tab (51mg) on Monday, Wednesday, and Friday. Take 1 tab (1068m) on Sunday, Tuesday, Thursday, and Saturday.    . morphine (MS CONTIN) 15 MG 12 hr tablet Take 1 tablet (15 mg total) by mouth every 12 (twelve) hours. 60 tablet 0  . Multiple Vitamin (MULITIVITAMIN WITH MINERALS) TABS Take 1 tablet by mouth every morning.     . ondansetron (ZOFRAN ODT) 8 MG disintegrating tablet Take 1 tablet (8 mg total) by mouth every 8 (eight) hours as needed for nausea or vomiting. 60 tablet 1  . ondansetron (ZOFRAN) 8 MG tablet Take 1/2 to 1  tablet 3 x day only if needed for severe nausea or vomiting (Patient taking differently: Take 8 mg by mouth every 8 (eight) hours as needed for nausea. ) 30 tablet 1  . pantoprazole (PROTONIX) 40 MG tablet Take 1 tablet (40 mg total) by mouth 2 (two) times daily. 60 tablet 3  . pomalidomide (POMALYST) 4 MG capsule Take 1 capsule (4 mg total) by mouth daily. Take with water on days 1-21. Repeat every 28 days. 21 capsule 0  . promethazine-dextromethorphan (PROMETHAZINE-DM) 6.25-15 MG/5ML syrup Take 1 teaspoon 4 x day ONLY if needed for severe cough 240 mL 0   No current facility-administered medications for this visit.    Allergies as of 08/12/2014  . (No Known Allergies)    Family History  Problem Relation Age of Onset  . Lung cancer Brother   . Colon cancer Maternal Uncle   . Breast cancer Maternal Grandmother     History   Social History  . Marital Status: Married    Spouse Name: N/A  . Number of Children: N/A  . Years of Education: N/A   Occupational History  . Not on file.   Social History Main Topics  . Smoking status: Former Smoker -- 0.25 packs/day for 6 years    Types: Cigarettes    Quit date: 08/27/1978  . Smokeless tobacco: Never Used  . Alcohol Use: No     Comment: "quit drinking in the 1980's", previously drank on the weekend  . Drug Use: No  . Sexual Activity: Not Currently   Other Topics Concern  . Not on file   Social History  Narrative   She lives with alone.  She has two grown children.   She is on disability, started in 2010.  Previously working at [employer]  Highest level of education:  12th grade      Physical Exam: BP 118/70 mmHg  Pulse 76  Ht 5' 4"  (1.626 m)  Wt 206 lb 4 oz (93.554 kg)  BMI 35.39 kg/m2 Constitutional: generally well-appearing Psychiatric: alert and oriented x3 Abdomen: soft, nontender, nondistended, no obvious ascites, no peritoneal signs, normal bowel sounds  Assessment and plan: 64 y.o. female with multifactorial gastrointestinal symptoms  Several of her medicines can have GI side effects, perhaps silver pains is from the multiple myeloma itself, she is on her current pain medicine 2-3 times daily and neck and had significant GI side effects as well. Seems that sublingual antispasm medicines have helped a bit. I'm giving her an oral pill form to take twice daily to see if we can prevent some spasms. She will return to see me in 2 months and sooner if needed.

## 2014-08-15 ENCOUNTER — Other Ambulatory Visit: Payer: Self-pay | Admitting: Medical Oncology

## 2014-08-15 ENCOUNTER — Telehealth: Payer: Self-pay | Admitting: Internal Medicine

## 2014-08-15 NOTE — Telephone Encounter (Signed)
, °

## 2014-08-16 ENCOUNTER — Other Ambulatory Visit (HOSPITAL_BASED_OUTPATIENT_CLINIC_OR_DEPARTMENT_OTHER): Payer: Medicare Other

## 2014-08-16 ENCOUNTER — Telehealth: Payer: Self-pay | Admitting: Internal Medicine

## 2014-08-16 ENCOUNTER — Ambulatory Visit: Payer: Self-pay

## 2014-08-16 ENCOUNTER — Encounter: Payer: Self-pay | Admitting: Physician Assistant

## 2014-08-16 ENCOUNTER — Ambulatory Visit (HOSPITAL_BASED_OUTPATIENT_CLINIC_OR_DEPARTMENT_OTHER): Payer: Medicare Other | Admitting: Physician Assistant

## 2014-08-16 VITALS — BP 114/62 | HR 91 | Temp 99.6°F | Resp 18 | Ht 64.0 in | Wt 201.9 lb

## 2014-08-16 DIAGNOSIS — C9002 Multiple myeloma in relapse: Secondary | ICD-10-CM

## 2014-08-16 DIAGNOSIS — C9000 Multiple myeloma not having achieved remission: Secondary | ICD-10-CM

## 2014-08-16 LAB — CBC WITH DIFFERENTIAL/PLATELET
BASO%: 0.7 % (ref 0.0–2.0)
BASOS ABS: 0 10*3/uL (ref 0.0–0.1)
EOS ABS: 0.3 10*3/uL (ref 0.0–0.5)
EOS%: 10.8 % — ABNORMAL HIGH (ref 0.0–7.0)
HEMATOCRIT: 36.4 % (ref 34.8–46.6)
HGB: 11.4 g/dL — ABNORMAL LOW (ref 11.6–15.9)
LYMPH#: 0.9 10*3/uL (ref 0.9–3.3)
LYMPH%: 34.9 % (ref 14.0–49.7)
MCH: 31.2 pg (ref 25.1–34.0)
MCHC: 31.3 g/dL — ABNORMAL LOW (ref 31.5–36.0)
MCV: 99.7 fL (ref 79.5–101.0)
MONO#: 0.5 10*3/uL (ref 0.1–0.9)
MONO%: 17.8 % — ABNORMAL HIGH (ref 0.0–14.0)
NEUT#: 1 10*3/uL — ABNORMAL LOW (ref 1.5–6.5)
NEUT%: 35.8 % — ABNORMAL LOW (ref 38.4–76.8)
PLATELETS: 225 10*3/uL (ref 145–400)
RBC: 3.65 10*6/uL — ABNORMAL LOW (ref 3.70–5.45)
RDW: 16 % — ABNORMAL HIGH (ref 11.2–14.5)
WBC: 2.7 10*3/uL — AB (ref 3.9–10.3)

## 2014-08-16 LAB — COMPREHENSIVE METABOLIC PANEL (CC13)
ALT: 15 U/L (ref 0–55)
AST: 8 U/L (ref 5–34)
Albumin: 3.4 g/dL — ABNORMAL LOW (ref 3.5–5.0)
Alkaline Phosphatase: 67 U/L (ref 40–150)
Anion Gap: 10 mEq/L (ref 3–11)
BILIRUBIN TOTAL: 0.96 mg/dL (ref 0.20–1.20)
BUN: 15.1 mg/dL (ref 7.0–26.0)
CO2: 27 mEq/L (ref 22–29)
Calcium: 9.1 mg/dL (ref 8.4–10.4)
Chloride: 105 mEq/L (ref 98–109)
Creatinine: 0.8 mg/dL (ref 0.6–1.1)
Glucose: 96 mg/dl (ref 70–140)
POTASSIUM: 3.3 meq/L — AB (ref 3.5–5.1)
Sodium: 142 mEq/L (ref 136–145)
TOTAL PROTEIN: 6.4 g/dL (ref 6.4–8.3)

## 2014-08-16 LAB — LACTATE DEHYDROGENASE (CC13): LDH: 147 U/L (ref 125–245)

## 2014-08-16 MED ORDER — MORPHINE SULFATE ER 15 MG PO TBCR: 15.0000 mg | EXTENDED_RELEASE_TABLET | Freq: Two times a day (BID) | ORAL | Status: DC

## 2014-08-16 MED ORDER — HYDROCODONE-ACETAMINOPHEN 7.5-325 MG PO TABS: 1.0000 | ORAL_TABLET | Freq: Four times a day (QID) | ORAL | Status: DC | PRN

## 2014-08-16 NOTE — Telephone Encounter (Signed)
gv and printed appt sched adn avs for pt for March....sed added tx.

## 2014-08-16 NOTE — Progress Notes (Addendum)
[name] [phone]   Fax:([phone] [place], [place] [address]  DIAGNOSIS: Recurrent multiple myeloma initially diagnosed in October 2008.   PRIOR THERAPY:  1. Status post palliative radiotherapy to the lumbar spine between L3 and L5. The patient received a total dose of 3000 cGy in 10 fractions under the care of Dr. Lisbeth [name] between May 05, 2007 through May 18, 2007. 2. Status post 5 cycles of systemic chemotherapy with Revlimid and low-dose Decadron with good response to this treatment. 3. Status post autologous peripheral blood stem cell transplant at [facility] on Nov 20, 2007 under the care of Dr. Valarie [name]. 4. Status post treatment for disease recurrence with Velcade, Doxil and Decadron. Last dose given Nov 09, 2009. Discontinued secondary to intolerance but the patient had a good response to treatment at that time. 5. Status post palliative radiotherapy to the T2-T6 thoracic vertebrae completed 03/21/2011 under the care of Dr. Lisbeth [name]. 6. Systemic chemotherapy with Velcade 1.3 mg per meter squared given on days 1, 4, 8 and 11, and Doxil at 30 mg per meter squared given on day 4 in addition to Decadron status post 4 cycles, discontinued secondary to intolerance. 7. Systemic therapy with Velcade 1.3 mg/M2 subcutaneously in addition to Decadron 40 mg by mouth on a weekly basis, status post 20 cycles. The patient had good response with this treatment but it is discontinue today secondary to worsening peripheral neuropathy. 8. Palliative radiotherapy to the skull lesion as well as the left hip area under the care of Dr. Lisbeth [name]. 9. Systemic chemotherapy with Carfilzomib 20 mg/M2 on days 1, 2,  8, 9, 13 and 16 every 4 weeks in addition to weekly Decadron 40 mg by mouth. First dose on 04/19/2013. Status post 4 cycles, discontinued recently secondary to cardiac  dysfunction.  CURRENT THERAPY:  1. Pomalyst 4 mg by mouth daily for 21 days every 4 weeks in addition to dexamethasone 40 mg on a weekly basis. First dose started 12/02/2013. Status post 7 cycles. She will start cycle #9 on 08/23/2014 2. Zometa 4 mg IV given every 3 months   INTERVAL HISTORY: [name] 64 y.o. female returns to the clinic today for follow up visit accompanied by her daughter. She completed cycle #8 today. She reports having significant swelling in her legs necessitating a visit to the emergency room. The risk surgeon about possible congestive heart failure. She will be seeing a cardiologist in the near future for further evaluation. She had some x-rays taken of her neck and was told that she had narrowing of her vertebrae. She was offered 3 options: Surgery, steroid injections, or physical therapy. Patient opted for physical therapy. She continues to have issues with dizziness and feeling weak as if she'll pass out. This may be related to the issues in her neck. She also periodically gets the "shakes" and drops things periodically as well as. She continues to complain of chronic back pain and intermittent pain in her neck especially with movement. She reports occasional cramps. She is tolerating her current treatment with Pomalyst fairly well with no significant adverse effects. She is expected to start cycle #9 next week. She denied having any significant fatigue or weakness. The patient denied having any significant chest pain, shortness of breath, or hemoptysis. She has no nausea or vomiting. She has no weight loss or night sweats. She continues to have mild peripheral neuropathy  and is currently on gabapentin.   MEDICAL HISTORY: Past Medical History  Diagnosis Date  . Hypercholesterolemia   . Compression fracture 04/08/2007    pathologic compression fracture  . Hypothyroidism   . FHx: chemotherapy     s/p 5 cycle revlimid/low dose decadron,s/p velcade,doxil,decadron,   . Hx of radiation therapy 05/05/07-05/18/07,& 03/05/11-03/21/11-    l3&l5 in 2008, t2-t6 03/2011  . GERD (gastroesophageal reflux disease)   . Insomnia     associated with steroids  . Constipation     takes oxycontin,vicodin  . Hx of radiation therapy 05/05/2007 to 05/18/2007    palliative, L3-5  . Hx of radiation therapy 03/05/2011 to 03/21/2011    palliative T2-T6, c-spine  . History of autologous stem cell transplant 11/20/2007    [facility], Dr [name]  . PONV (postoperative nausea and vomiting)   . Multiple myeloma dx'd 2009  . Metastasis to bone   . Family history of anesthesia complication     "daughter gets PONV too"  . Pneumonia     "several times"  . Stroke 2014    denies residual on 01/27/2014    ALLERGIES:  has No Known Allergies.  MEDICATIONS:  Current Outpatient Prescriptions  Medication Sig Dispense Refill  . ALPRAZolam (XANAX) 0.5 MG tablet TAKE 1 TABLET BY MOUTH AS NEEDED FOR ANXIETY 30 tablet 3  . aspirin 81 MG tablet Take 81 mg by mouth daily.    . baclofen (LIORESAL) 10 MG tablet Take 1 tablet (10 mg total) by mouth 2 (two) times daily. 60 tablet 3  . cyclobenzaprine (FLEXERIL) 10 MG tablet Take 1/2-1 tablet TID PRN for muscle spasms (Patient taking differently: Take 5-10 mg by mouth 3 (three) times daily as needed. ) 90 tablet 0  . dexamethasone (DECADRON) 4 MG tablet TAKE 10 TABLETS BY MOUTH EVERY FRIDAY 40 tablet 1  . Eszopiclone 3 MG TABS Take 3 mg by mouth at bedtime as needed (sleep). Take immediately before bedtime    . fexofenadine (ALLEGRA) 30 MG tablet Take 1 tablet (30 mg total) by mouth 2 (two) times daily. 30 tablet 1  . furosemide (LASIX) 40 MG tablet TAKE 1 TABLET BY MOUTH TWICE A DAY FOR FLUID AND SWELLING 60 tablet 10  . gabapentin (NEURONTIN) 600 MG tablet Take 600 mg by mouth 4 (four) times daily as needed. For pain or cramps    . HYDROcodone-acetaminophen (NORCO) 7.5-325 MG per tablet Take 1 tablet by mouth every 6 (six) hours as needed for moderate  pain. 30 tablet 0  . hyoscyamine (LEVBID) 0.375 MG 12 hr tablet Take 1 tablet (0.375 mg total) by mouth 2 (two) times daily. 60 tablet 3  . hyoscyamine (LEVSIN SL) 0.125 MG SL tablet Place 1 tablet (0.125 mg total) under the tongue every 6 (six) hours as needed. (Patient taking differently: Place 0.125 mg under the tongue every 6 (six) hours as needed for cramping. ) 30 tablet 4  . KLOR-CON M10 10 MEQ tablet TAKE 1 TABLET BY MOUTH TWICE A DAY 60 tablet 3  . levothyroxine (SYNTHROID, LEVOTHROID) 100 MCG tablet Take 50-100 mcg by mouth daily before breakfast. Take 0.5 tab (53mg) on Monday, Wednesday, and Friday. Take 1 tab (1016m) on Sunday, Tuesday, Thursday, and Saturday.    . morphine (MS CONTIN) 15 MG 12 hr tablet Take 1 tablet (15 mg total) by mouth every 12 (twelve) hours. 60 tablet 0  . Multiple Vitamin (MULITIVITAMIN WITH MINERALS) TABS Take 1 tablet by mouth every morning.     .Marland Kitchen  ondansetron (ZOFRAN ODT) 8 MG disintegrating tablet Take 1 tablet (8 mg total) by mouth every 8 (eight) hours as needed for nausea or vomiting. 60 tablet 1  . ondansetron (ZOFRAN) 8 MG tablet Take 1/2 to 1  tablet 3 x day only if needed for severe nausea or vomiting (Patient taking differently: Take 8 mg by mouth every 8 (eight) hours as needed for nausea. ) 30 tablet 1  . pantoprazole (PROTONIX) 40 MG tablet Take 1 tablet (40 mg total) by mouth 2 (two) times daily. 60 tablet 3  . pomalidomide (POMALYST) 4 MG capsule Take 1 capsule (4 mg total) by mouth daily. Take with water on days 1-21. Repeat every 28 days. 21 capsule 0  . promethazine-dextromethorphan (PROMETHAZINE-DM) 6.25-15 MG/5ML syrup Take 1 teaspoon 4 x day ONLY if needed for severe cough 240 mL 0   No current facility-administered medications for this visit.    SURGICAL HISTORY:  Past Surgical History  Procedure Laterality Date  . Knee arthroscopy Right     "put pin in"  . Knee arthroscopy Right     "took pin out and corrected what was wrong"  .  Video bronchoscopy  07/30/2011    Procedure: VIDEO BRONCHOSCOPY WITHOUT FLUORO;  Surgeon: Kathee Delton, MD;  Location: [facility];  Service: Cardiopulmonary;  Laterality: Bilateral;  . Cholecystectomy  1980's  . Portacath placement Right 2009  . Vaginal hysterectomy  1980's    REVIEW OF SYSTEMS:  Constitutional: positive for fatigue Eyes: negative Ears, nose, mouth, throat, and face: negative Respiratory: negative Cardiovascular: positive for lower extremity edema Gastrointestinal: negative Genitourinary:negative Integument/breast: negative Hematologic/lymphatic: negative Musculoskeletal:positive for back pain, neck pain and muscle cramps Neurological: positive for paresthesia Behavioral/Psych: negative   PHYSICAL EXAMINATION: General appearance: alert, cooperative, fatigued and no distress Head: Normocephalic, without obvious abnormality, atraumatic Neck: no adenopathy, no JVD, supple, symmetrical, trachea midline and thyroid not enlarged, symmetric, no tenderness/mass/nodules Lymph nodes: Cervical, supraclavicular, and axillary nodes normal. Resp: clear to auscultation bilaterally Back: symmetric, no curvature. ROM normal. No CVA tenderness. Cardio: regular rate and rhythm, S1, S2 normal, no murmur, click, rub or gallop GI: soft, non-tender; bowel sounds normal; no masses,  no organomegaly Extremities: extremities normal, atraumatic, no cyanosis or edema Neurologic: Alert and oriented X 3, normal strength and tone. Normal symmetric reflexes. Normal coordination and gait  ECOG PERFORMANCE STATUS: 1 - Symptomatic but completely ambulatory  Blood pressure 114/62, pulse 91, temperature 99.6 F (37.6 C), temperature source Oral, resp. rate 18, height 5' 4"  (1.626 m), weight 201 lb 14.4 oz (91.581 kg), SpO2 100 %.  LABORATORY DATA: Lab Results  Component Value Date   WBC 2.7* 08/16/2014   HGB 11.4* 08/16/2014   HCT 36.4 08/16/2014   MCV 99.7 08/16/2014   PLT 225 08/16/2014       Chemistry      Component Value Date/Time   NA 142 08/16/2014 0947   NA 143 08/08/2014 1500   K 3.3* 08/16/2014 0947   K 3.7 08/08/2014 1500   CL 106 08/08/2014 1500   CL 105 12/17/2012 1015   CO2 27 08/16/2014 0947   CO2 27 08/08/2014 1500   BUN 15.1 08/16/2014 0947   BUN 12 08/08/2014 1500   CREATININE 0.8 08/16/2014 0947   CREATININE 0.84 08/08/2014 1500   CREATININE 1.14* 08/06/2014 0203      Component Value Date/Time   CALCIUM 9.1 08/16/2014 0947   CALCIUM 8.2* 08/08/2014 1500   ALKPHOS 67 08/16/2014 0947   ALKPHOS 72 08/08/2014  1500   AST 8 08/16/2014 0947   AST 10 08/08/2014 1500   ALT 15 08/16/2014 0947   ALT 20 08/08/2014 1500   BILITOT 0.96 08/16/2014 0947   BILITOT 0.5 08/08/2014 1500     Other lab results: Beta-2 microglobulin 2.69, free kappa light chain 2.41, free lambda light chain 7.26 and a kappa/lambda ratio of 0.33. IgG 995, IgA 139 and IgM 20.  RADIOGRAPHIC STUDIES:  ASSESSMENT AND PLAN:  This is a very pleasant 64 years old [place] American female with recurrent multiple myeloma recently completed a course of treatment with Velcade and Decadron with improvement in her disease but this was discontinued secondary to peripheral neuropathy. The patient tolerated her treatment with Carfilzomib and Decadron fairly well except for the recent shortness breath and cough which was felt to be secondary to congestive heart failure from her treatment with Carfilzomib. This treatment was discontinued. She was started on treatment with Pomalyst and Decadron status post 8 cycles. She tolerated the last cycle of her treatment well. The recent myeloma panel showed stable disease. Patient was discussed with and also seen by Dr. Julien Nordmann. She will continue her treatment and she will start cycle #9 next week. She is slightly hypokalemic with a potassium of 3.3. She currently takes Klor-Con 10 milliequivalents by mouth twice daily. She is asked to increase this to 20 mEq by  mouth twice daily for the next week. She voiced understanding. She is encouraged to follow-up with cardiology as scheduled regarding her possible congestive heart failure. She follow-up in 4 weeks for re-evaluation.  The patient will continue her current treatment with Zometa every 3 months as scheduled. She was advised to call immediately if she has any concerning symptoms in the interval. The patient voices understanding of current disease status and treatment options and is in agreement with the current care plan.  All questions were answered. The patient knows to call the clinic with any problems, questions or concerns. We can certainly see the patient much sooner if necessary.  Carlton Adam, [name] 08/16/2014  ADDENDUM: Hematology/Oncology Attending: I had a face to face encounter with the patient. I recommended her care plan. This is a very pleasant 63 years old African-American female with relapsed multiple myeloma currently undergoing systemic chemotherapy with Pomalyst and Decadron status post 8 cycles and she is expected to start cycle #9 next week. She is tolerating her treatment fairly well except for the generalized aching pain and fatigue. I recommended for her to continue her current treatment as scheduled. She would come back for follow-up visit in 4 weeks after repeating myeloma panel for evaluation of her disease. She was advised to call immediately if she has any concerning symptoms in the interval.   Disclaimer: This note was dictated with voice recognition software. Similar sounding words can inadvertently be transcribed and may be missed upon review. Eilleen Kempf., MD 08/16/2014

## 2014-08-16 NOTE — Patient Instructions (Signed)
Continue Pomalyst and dexamethasone as prescribed Take 20 meq of potassium twice daily for the next 7 days Follow up with cardiology as scheduled Follow up in one month

## 2014-08-17 DIAGNOSIS — Z8673 Personal history of transient ischemic attack (TIA), and cerebral infarction without residual deficits: Secondary | ICD-10-CM | POA: Diagnosis not present

## 2014-08-17 DIAGNOSIS — K219 Gastro-esophageal reflux disease without esophagitis: Secondary | ICD-10-CM | POA: Diagnosis not present

## 2014-08-17 DIAGNOSIS — C9000 Multiple myeloma not having achieved remission: Secondary | ICD-10-CM | POA: Diagnosis not present

## 2014-08-17 DIAGNOSIS — G6281 Critical illness polyneuropathy: Secondary | ICD-10-CM | POA: Diagnosis not present

## 2014-08-17 DIAGNOSIS — E038 Other specified hypothyroidism: Secondary | ICD-10-CM | POA: Diagnosis not present

## 2014-08-18 ENCOUNTER — Telehealth: Payer: Self-pay

## 2014-08-18 MED ORDER — CITALOPRAM HYDROBROMIDE 20 MG PO TABS: 20.0000 mg | ORAL_TABLET | Freq: Every day | ORAL | Status: DC

## 2014-08-18 NOTE — Telephone Encounter (Signed)
Received paper note from front office staff, patient calls and states that she finds herself in more of a depressed state lately. States that she finds herself crying more often and doesn't know why, also getting upset or depressed over small non issues. Denies any suicidal ideation. Per Vicie Mutters, PA sent in Celexa 20 mg , one daily, she is agreeable to this and she was advised to start out with 1/2 pill daily and if tolerates and needs to increase to one daily she may do that. I also gave her the number to the [facility] [phone]. She states that she does talk to the nurses she comes in contact with sometimes, advised her that although that's helpful to have someone to talk to , she should contact a professional to speak with. She was agreeable. She will follow up in 1-2 weeks.

## 2014-08-20 DIAGNOSIS — Z8673 Personal history of transient ischemic attack (TIA), and cerebral infarction without residual deficits: Secondary | ICD-10-CM | POA: Diagnosis not present

## 2014-08-20 DIAGNOSIS — E038 Other specified hypothyroidism: Secondary | ICD-10-CM | POA: Diagnosis not present

## 2014-08-20 DIAGNOSIS — K219 Gastro-esophageal reflux disease without esophagitis: Secondary | ICD-10-CM | POA: Diagnosis not present

## 2014-08-20 DIAGNOSIS — G6281 Critical illness polyneuropathy: Secondary | ICD-10-CM | POA: Diagnosis not present

## 2014-08-20 DIAGNOSIS — C9000 Multiple myeloma not having achieved remission: Secondary | ICD-10-CM | POA: Diagnosis not present

## 2014-08-23 DIAGNOSIS — K219 Gastro-esophageal reflux disease without esophagitis: Secondary | ICD-10-CM | POA: Diagnosis not present

## 2014-08-23 DIAGNOSIS — E038 Other specified hypothyroidism: Secondary | ICD-10-CM | POA: Diagnosis not present

## 2014-08-23 DIAGNOSIS — C9000 Multiple myeloma not having achieved remission: Secondary | ICD-10-CM | POA: Diagnosis not present

## 2014-08-23 DIAGNOSIS — Z8673 Personal history of transient ischemic attack (TIA), and cerebral infarction without residual deficits: Secondary | ICD-10-CM | POA: Diagnosis not present

## 2014-08-23 DIAGNOSIS — G6281 Critical illness polyneuropathy: Secondary | ICD-10-CM | POA: Diagnosis not present

## 2014-08-25 DIAGNOSIS — Z8673 Personal history of transient ischemic attack (TIA), and cerebral infarction without residual deficits: Secondary | ICD-10-CM | POA: Diagnosis not present

## 2014-08-25 DIAGNOSIS — K219 Gastro-esophageal reflux disease without esophagitis: Secondary | ICD-10-CM | POA: Diagnosis not present

## 2014-08-25 DIAGNOSIS — G6281 Critical illness polyneuropathy: Secondary | ICD-10-CM | POA: Diagnosis not present

## 2014-08-25 DIAGNOSIS — E038 Other specified hypothyroidism: Secondary | ICD-10-CM | POA: Diagnosis not present

## 2014-08-25 DIAGNOSIS — C9000 Multiple myeloma not having achieved remission: Secondary | ICD-10-CM | POA: Diagnosis not present

## 2014-08-29 DIAGNOSIS — G6281 Critical illness polyneuropathy: Secondary | ICD-10-CM | POA: Diagnosis not present

## 2014-08-29 DIAGNOSIS — E038 Other specified hypothyroidism: Secondary | ICD-10-CM | POA: Diagnosis not present

## 2014-08-29 DIAGNOSIS — Z8673 Personal history of transient ischemic attack (TIA), and cerebral infarction without residual deficits: Secondary | ICD-10-CM | POA: Diagnosis not present

## 2014-08-29 DIAGNOSIS — K219 Gastro-esophageal reflux disease without esophagitis: Secondary | ICD-10-CM | POA: Diagnosis not present

## 2014-08-29 DIAGNOSIS — C9000 Multiple myeloma not having achieved remission: Secondary | ICD-10-CM | POA: Diagnosis not present

## 2014-08-31 ENCOUNTER — Telehealth: Payer: Self-pay | Admitting: *Deleted

## 2014-08-31 DIAGNOSIS — Z8673 Personal history of transient ischemic attack (TIA), and cerebral infarction without residual deficits: Secondary | ICD-10-CM | POA: Diagnosis not present

## 2014-08-31 DIAGNOSIS — E038 Other specified hypothyroidism: Secondary | ICD-10-CM | POA: Diagnosis not present

## 2014-08-31 DIAGNOSIS — C9000 Multiple myeloma not having achieved remission: Secondary | ICD-10-CM | POA: Diagnosis not present

## 2014-08-31 DIAGNOSIS — G6281 Critical illness polyneuropathy: Secondary | ICD-10-CM | POA: Diagnosis not present

## 2014-08-31 DIAGNOSIS — K219 Gastro-esophageal reflux disease without esophagitis: Secondary | ICD-10-CM | POA: Diagnosis not present

## 2014-08-31 NOTE — Telephone Encounter (Signed)
[facility] called to let us know that patient is graduating from home PT but would like to continue with outside PT.  Ok per Dr. Posey Pronto.  Order sent to [facility].

## 2014-09-05 ENCOUNTER — Other Ambulatory Visit: Payer: Self-pay | Admitting: Physician Assistant

## 2014-09-05 DIAGNOSIS — M542 Cervicalgia: Secondary | ICD-10-CM | POA: Diagnosis not present

## 2014-09-07 ENCOUNTER — Other Ambulatory Visit: Payer: Self-pay | Admitting: Physician Assistant

## 2014-09-07 ENCOUNTER — Other Ambulatory Visit: Payer: Self-pay | Admitting: *Deleted

## 2014-09-07 DIAGNOSIS — R7989 Other specified abnormal findings of blood chemistry: Secondary | ICD-10-CM

## 2014-09-09 ENCOUNTER — Encounter: Payer: Self-pay | Admitting: Internal Medicine

## 2014-09-09 ENCOUNTER — Ambulatory Visit (INDEPENDENT_AMBULATORY_CARE_PROVIDER_SITE_OTHER): Payer: Medicare Other | Admitting: Internal Medicine

## 2014-09-09 VITALS — BP 124/70 | HR 78 | Temp 98.0°F | Resp 18 | Ht 64.75 in | Wt 207.0 lb

## 2014-09-09 DIAGNOSIS — C9000 Multiple myeloma not having achieved remission: Secondary | ICD-10-CM | POA: Diagnosis not present

## 2014-09-09 DIAGNOSIS — E1122 Type 2 diabetes mellitus with diabetic chronic kidney disease: Secondary | ICD-10-CM | POA: Diagnosis not present

## 2014-09-09 DIAGNOSIS — E559 Vitamin D deficiency, unspecified: Secondary | ICD-10-CM

## 2014-09-09 DIAGNOSIS — R05 Cough: Secondary | ICD-10-CM

## 2014-09-09 DIAGNOSIS — E785 Hyperlipidemia, unspecified: Secondary | ICD-10-CM | POA: Diagnosis not present

## 2014-09-09 DIAGNOSIS — M7989 Other specified soft tissue disorders: Secondary | ICD-10-CM

## 2014-09-09 DIAGNOSIS — I10 Essential (primary) hypertension: Secondary | ICD-10-CM | POA: Diagnosis not present

## 2014-09-09 DIAGNOSIS — N183 Chronic kidney disease, stage 3 unspecified: Secondary | ICD-10-CM

## 2014-09-09 DIAGNOSIS — Z79899 Other long term (current) drug therapy: Secondary | ICD-10-CM | POA: Diagnosis not present

## 2014-09-09 DIAGNOSIS — R059 Cough, unspecified: Secondary | ICD-10-CM

## 2014-09-09 DIAGNOSIS — E039 Hypothyroidism, unspecified: Secondary | ICD-10-CM

## 2014-09-09 DIAGNOSIS — R0600 Dyspnea, unspecified: Secondary | ICD-10-CM

## 2014-09-09 LAB — CBC WITH DIFFERENTIAL/PLATELET
Basophils Absolute: 0 10*3/uL (ref 0.0–0.1)
Basophils Relative: 1 % (ref 0–1)
EOS PCT: 5 % (ref 0–5)
Eosinophils Absolute: 0.2 10*3/uL (ref 0.0–0.7)
HCT: 35 % — ABNORMAL LOW (ref 36.0–46.0)
Hemoglobin: 11.1 g/dL — ABNORMAL LOW (ref 12.0–15.0)
Lymphocytes Relative: 20 % (ref 12–46)
Lymphs Abs: 0.6 10*3/uL — ABNORMAL LOW (ref 0.7–4.0)
MCH: 31.3 pg (ref 26.0–34.0)
MCHC: 31.7 g/dL (ref 30.0–36.0)
MCV: 98.6 fL (ref 78.0–100.0)
MONO ABS: 0.2 10*3/uL (ref 0.1–1.0)
MPV: 9.1 fL (ref 8.6–12.4)
Monocytes Relative: 7 % (ref 3–12)
Neutro Abs: 2.1 10*3/uL (ref 1.7–7.7)
Neutrophils Relative %: 67 % (ref 43–77)
PLATELETS: 226 10*3/uL (ref 150–400)
RBC: 3.55 MIL/uL — AB (ref 3.87–5.11)
RDW: 17.1 % — ABNORMAL HIGH (ref 11.5–15.5)
WBC: 3.1 10*3/uL — ABNORMAL LOW (ref 4.0–10.5)

## 2014-09-09 LAB — HEMOGLOBIN A1C
Hgb A1c MFr Bld: 6.1 % — ABNORMAL HIGH (ref <5.7)
Mean Plasma Glucose: 128 mg/dL — ABNORMAL HIGH (ref <117)

## 2014-09-09 MED ORDER — AZITHROMYCIN 250 MG PO TABS
ORAL_TABLET | ORAL | Status: DC
Start: 2014-09-09 — End: 2014-09-13

## 2014-09-09 MED ORDER — FAMOTIDINE 20 MG PO TABS: 20.0000 mg | ORAL_TABLET | Freq: Two times a day (BID) | ORAL | Status: DC

## 2014-09-09 NOTE — Progress Notes (Signed)
Subjective:    Patient ID: [name], female    DOB: [birth year], 64 y.o.   MRN: [id number]  HPI  Patient is a 64 y.o. Female with pmh of MM that is currently being treated with Pomilidomide presents to the office with multiple complaints of leg swelling, post nasal drip, and also the need for bloodwork.  Patient states that she has had leg swelling for the past 2-3 months.  Swelling has gotten a lot worse since February.  Patient states that she was told that she did have CHF.  She had a normal EF in 12/2013 of 60-65%.  She states that her left left has been swelling more than her right leg.  She does not have a history of blood clots.  She recently had a negative DVT study performed on her left leg on 07/05/14.  She was told to start wearing compression stockings at that time by [facility] [place] in January.  She wears it some days.  She also tried to elevated her legs.   Patient also reports that she has increasing tickle feeling in her throat as if there is something in her throat for approximately 1 week.  She has been trying mucinex and allergy medications at home.  She is currently on decadron.  She also states that she has had some dry cough and worsening water brash and reflux symptoms.  She is currently on protonix daily.  Pt. Also unhappy with her current therapist at [facility].  She feels like they didn't know what was going on with her and she felt like they were dangerous.  She wants a referral to somewhere else.      Review of Systems  Constitutional: Negative for fever, chills and fatigue.  HENT: Positive for congestion, postnasal drip and voice change. Negative for sinus pressure and sore throat.   Eyes: Positive for redness. Negative for photophobia, pain, discharge, itching and visual disturbance.  Respiratory: Positive for shortness of breath. Negative for chest tightness and wheezing.   Cardiovascular: Positive for leg swelling. Negative for chest pain and palpitations.   Gastrointestinal: Positive for nausea, abdominal pain and diarrhea. Negative for vomiting, constipation, blood in stool and anal bleeding.  Genitourinary: Negative for dysuria, urgency, frequency, hematuria and difficulty urinating.  Musculoskeletal: Positive for myalgias and arthralgias.  Neurological: Positive for light-headedness.       Objective:   Physical Exam  Constitutional: She is oriented to person, place, and time. She appears well-developed and well-nourished. No distress.  HENT:  Head: Normocephalic and atraumatic.  Nose: No mucosal edema.  Mouth/Throat: Uvula is midline and oropharynx is clear and moist. No oropharyngeal exudate, posterior oropharyngeal edema or posterior oropharyngeal erythema.  Eyes: Conjunctivae and EOM are normal. Pupils are equal, round, and reactive to light. No scleral icterus.  Neck: Normal range of motion. Neck supple. No JVD present. No thyromegaly present.  Cardiovascular: Normal rate, regular rhythm, normal heart sounds and intact distal pulses.  Exam reveals no gallop and no friction rub.   No murmur heard. Negative Holmans sign bilaterally.  There is 2-3 + pitting edema bilaterally.  Left leg circumference is slightly larger than right.    Pulmonary/Chest: Effort normal and breath sounds normal. No respiratory distress. She has no wheezes. She has no rales. She exhibits no tenderness.  Abdominal: Soft. Bowel sounds are normal. She exhibits no distension and no mass. There is no tenderness. There is no rebound and no guarding.  Musculoskeletal: Normal range of motion.  Lymphadenopathy:  She has no cervical adenopathy.  Neurological: She is alert and oriented to person, place, and time. No cranial nerve deficit. Coordination normal.  Skin: Skin is warm and dry. She is not diaphoretic.  Psychiatric: She has a normal mood and affect. Her behavior is normal. Judgment and thought content normal.  Nursing note and vitals reviewed.  Filed Vitals:    09/09/14 1022  BP: 124/70  Pulse: 78  Temp: 98 F (36.7 C)  Resp: 18      Assessment & Plan:    1. Essential hypertension -DASH diet -Cont exercise as tolerated -BP well controlled in office - TSH  2. Hypothyroidism, unspecified hypothyroidism type -TSH -Cont meds  3. CKD stage 3 due to type 2 diabetes mellitus - Cont diet and exercise - Hemoglobin A1c - Insulin, fasting  4. Multiple myeloma -followed by Dr Earlie Server and is currently on decadron and oral chemo  5. Leg swelling  Patient had normal echo performed in 2015 with EF of 60-65%, BNP was acceptable then.  Could be due to her oral chemo medication.  Other chart review reveals no other acute medications that could cause ankle swelling.  Will check for heart failure meds as the patient could be causing cough, shortness of breath, and worsening leg swelling.  Have asked patient to talk with Dr. Earlie Server about the swelling as well.    -Elevate legs - Wear compression stockings -Check K level - Brain natriuretic peptide (73532) - May need to increase morning lasix vs. Adding in additional medication -Low sodium diet recommended - Mild water restriction recommended - weigh yourself daily and keep log -bring back in 2 weeks - 80 mg lasix in the morning, 40 mg lasix at night time.   - 40 mEq of Kcl daily   6. Vitamin D deficiency  - Vit D  25 hydroxy (rtn osteoporosis monitoring)  7. Hyperlipidemia -Cont diet and exercise - Lipid panel  8. Medication management -Check labs - CBC with Differential/Platelet - BASIC METABOLIC PANEL WITH GFR - Hepatic function panel - Magnesium  9.  GERD  - already on protonix -could be contributing to cough will try 2 week course of pepcid which was sent in to pharmacy.    10.  Cough -differential includes GERD, sinusitis, and CHF.  Given oral chemotherapy have low threshold to use abx.  Have called in a Zpak to take if she has worsening symptoms.  Have low suspicion  for sinusitis at this time.

## 2014-09-09 NOTE — Patient Instructions (Signed)
Peripheral Edema °You have swelling in your legs (peripheral edema). This swelling is due to excess accumulation of salt and water in your body. Edema may be a sign of heart, kidney or liver disease, or a side effect of a medication. It may also be due to problems in the leg veins. Elevating your legs and using special support stockings may be very helpful, if the cause of the swelling is due to poor venous circulation. Avoid long periods of standing, whatever the cause. °Treatment of edema depends on identifying the cause. Chips, pretzels, pickles and other salty foods should be avoided. Restricting salt in your diet is almost always needed. Water pills (diuretics) are often used to remove the excess salt and water from your body via urine. These medicines prevent the kidney from reabsorbing sodium. This increases urine flow. °Diuretic treatment may also result in lowering of potassium levels in your body. Potassium supplements may be needed if you have to use diuretics daily. Daily weights can help you keep track of your progress in clearing your edema. You should call your caregiver for follow up care as recommended. °SEEK IMMEDIATE MEDICAL CARE IF:  °· You have increased swelling, pain, redness, or heat in your legs. °· You develop shortness of breath, especially when lying down. °· You develop chest or abdominal pain, weakness, or fainting. °· You have a fever. °Document Released: 07/25/2004 Document Revised: 09/09/2011 Document Reviewed: 07/05/2009 °Exit[employer]® Patient Information ©2015 [employer]. This information is not intended to replace advice given to you by your health care provider. Make sure you discuss any questions you have with your health care provider. ° °

## 2014-09-10 LAB — BASIC METABOLIC PANEL WITH GFR
BUN: 9 mg/dL (ref 6–23)
CALCIUM: 8.7 mg/dL (ref 8.4–10.5)
CO2: 24 meq/L (ref 19–32)
Chloride: 103 mEq/L (ref 96–112)
Creat: 0.85 mg/dL (ref 0.50–1.10)
GFR, EST AFRICAN AMERICAN: 84 mL/min
GFR, Est Non African American: 73 mL/min
Glucose, Bld: 95 mg/dL (ref 70–99)
Potassium: 3.9 mEq/L (ref 3.5–5.3)
Sodium: 139 mEq/L (ref 135–145)

## 2014-09-10 LAB — HEPATIC FUNCTION PANEL
ALBUMIN: 3.5 g/dL (ref 3.5–5.2)
ALT: 11 U/L (ref 0–35)
AST: 13 U/L (ref 0–37)
Alkaline Phosphatase: 83 U/L (ref 39–117)
Bilirubin, Direct: 0.1 mg/dL (ref 0.0–0.3)
Indirect Bilirubin: 0.5 mg/dL (ref 0.2–1.2)
Total Bilirubin: 0.6 mg/dL (ref 0.2–1.2)
Total Protein: 6.2 g/dL (ref 6.0–8.3)

## 2014-09-10 LAB — MAGNESIUM: Magnesium: 1.6 mg/dL (ref 1.5–2.5)

## 2014-09-10 LAB — BRAIN NATRIURETIC PEPTIDE: BRAIN NATRIURETIC PEPTIDE: 33 pg/mL (ref 0.0–100.0)

## 2014-09-10 LAB — TSH: TSH: 1.361 u[IU]/mL (ref 0.350–4.500)

## 2014-09-10 LAB — LIPID PANEL
CHOLESTEROL: 179 mg/dL (ref 0–200)
HDL: 47 mg/dL (ref >=46)
LDL Cholesterol: 71 mg/dL (ref 0–99)
TRIGLYCERIDES: 303 mg/dL — AB (ref <150)
Total CHOL/HDL Ratio: 3.8 Ratio
VLDL: 61 mg/dL — ABNORMAL HIGH (ref 0–40)

## 2014-09-10 LAB — INSULIN, FASTING: Insulin fasting, serum: 14.6 u[IU]/mL (ref 2.0–19.6)

## 2014-09-10 LAB — VITAMIN D 25 HYDROXY (VIT D DEFICIENCY, FRACTURES): Vit D, 25-Hydroxy: 33 ng/mL (ref 30–100)

## 2014-09-13 ENCOUNTER — Telehealth: Payer: Self-pay | Admitting: *Deleted

## 2014-09-13 ENCOUNTER — Other Ambulatory Visit: Payer: Self-pay | Admitting: Medical Oncology

## 2014-09-13 ENCOUNTER — Other Ambulatory Visit (HOSPITAL_BASED_OUTPATIENT_CLINIC_OR_DEPARTMENT_OTHER): Payer: Medicare Other

## 2014-09-13 ENCOUNTER — Ambulatory Visit (HOSPITAL_BASED_OUTPATIENT_CLINIC_OR_DEPARTMENT_OTHER): Payer: Medicare Other

## 2014-09-13 ENCOUNTER — Ambulatory Visit (HOSPITAL_BASED_OUTPATIENT_CLINIC_OR_DEPARTMENT_OTHER): Payer: Medicare Other | Admitting: Internal Medicine

## 2014-09-13 ENCOUNTER — Telehealth: Payer: Self-pay | Admitting: Internal Medicine

## 2014-09-13 ENCOUNTER — Encounter: Payer: Self-pay | Admitting: Internal Medicine

## 2014-09-13 VITALS — BP 118/72 | HR 89 | Temp 98.3°F | Resp 18 | Ht 64.75 in | Wt 198.8 lb

## 2014-09-13 DIAGNOSIS — C9000 Multiple myeloma not having achieved remission: Secondary | ICD-10-CM

## 2014-09-13 DIAGNOSIS — E1122 Type 2 diabetes mellitus with diabetic chronic kidney disease: Secondary | ICD-10-CM

## 2014-09-13 DIAGNOSIS — C9002 Multiple myeloma in relapse: Secondary | ICD-10-CM

## 2014-09-13 DIAGNOSIS — G620 Drug-induced polyneuropathy: Secondary | ICD-10-CM | POA: Diagnosis not present

## 2014-09-13 DIAGNOSIS — N183 Chronic kidney disease, stage 3 unspecified: Secondary | ICD-10-CM

## 2014-09-13 DIAGNOSIS — R05 Cough: Secondary | ICD-10-CM

## 2014-09-13 DIAGNOSIS — R0602 Shortness of breath: Secondary | ICD-10-CM | POA: Diagnosis not present

## 2014-09-13 LAB — CBC WITH DIFFERENTIAL/PLATELET
BASO%: 1.2 % (ref 0.0–2.0)
Basophils Absolute: 0 10*3/uL (ref 0.0–0.1)
EOS%: 8.3 % — AB (ref 0.0–7.0)
Eosinophils Absolute: 0.3 10*3/uL (ref 0.0–0.5)
HEMATOCRIT: 35.3 % (ref 34.8–46.6)
HGB: 11.4 g/dL — ABNORMAL LOW (ref 11.6–15.9)
LYMPH%: 28.7 % (ref 14.0–49.7)
MCH: 32.1 pg (ref 25.1–34.0)
MCHC: 32.3 g/dL (ref 31.5–36.0)
MCV: 99.4 fL (ref 79.5–101.0)
MONO#: 0.4 10*3/uL (ref 0.1–0.9)
MONO%: 11 % (ref 0.0–14.0)
NEUT%: 50.8 % (ref 38.4–76.8)
NEUTROS ABS: 1.7 10*3/uL (ref 1.5–6.5)
Platelets: 217 10*3/uL (ref 145–400)
RBC: 3.55 10*6/uL — AB (ref 3.70–5.45)
RDW: 15.8 % — ABNORMAL HIGH (ref 11.2–14.5)
WBC: 3.3 10*3/uL — AB (ref 3.9–10.3)
lymph#: 0.9 10*3/uL (ref 0.9–3.3)

## 2014-09-13 LAB — COMPREHENSIVE METABOLIC PANEL (CC13)
ALBUMIN: 3.1 g/dL — AB (ref 3.5–5.0)
ALT: 13 U/L (ref 0–55)
ANION GAP: 11 meq/L (ref 3–11)
AST: 9 U/L (ref 5–34)
Alkaline Phosphatase: 68 U/L (ref 40–150)
BUN: 9.6 mg/dL (ref 7.0–26.0)
CO2: 25 meq/L (ref 22–29)
Calcium: 9.1 mg/dL (ref 8.4–10.4)
Chloride: 105 mEq/L (ref 98–109)
Creatinine: 0.9 mg/dL (ref 0.6–1.1)
EGFR: 84 mL/min/{1.73_m2} — AB (ref >90)
GLUCOSE: 105 mg/dL (ref 70–140)
POTASSIUM: 3.6 meq/L (ref 3.5–5.1)
SODIUM: 141 meq/L (ref 136–145)
TOTAL PROTEIN: 6.6 g/dL (ref 6.4–8.3)
Total Bilirubin: 1.39 mg/dL — ABNORMAL HIGH (ref 0.20–1.20)

## 2014-09-13 LAB — LACTATE DEHYDROGENASE (CC13): LDH: 147 U/L (ref 125–245)

## 2014-09-13 MED ORDER — HYDROCODONE-ACETAMINOPHEN 7.5-325 MG PO TABS: 1.0000 | ORAL_TABLET | Freq: Four times a day (QID) | ORAL | Status: DC | PRN

## 2014-09-13 MED ORDER — MORPHINE SULFATE ER 15 MG PO TBCR: 15.0000 mg | EXTENDED_RELEASE_TABLET | Freq: Two times a day (BID) | ORAL | Status: DC

## 2014-09-13 MED ORDER — SODIUM CHLORIDE 0.9 % IV SOLN
Freq: Once | INTRAVENOUS | Status: AC
  Administered 2014-09-13: 11:00:00 via INTRAVENOUS

## 2014-09-13 MED ORDER — ZOLEDRONIC ACID 4 MG/100ML IV SOLN
4.0000 mg | Freq: Once | INTRAVENOUS | Status: AC
  Administered 2014-09-13: 4 mg via INTRAVENOUS
  Filled 2014-09-13: qty 100

## 2014-09-13 MED ORDER — POMALIDOMIDE 4 MG PO CAPS: 4.0000 mg | ORAL_CAPSULE | Freq: Every day | ORAL | Status: DC

## 2014-09-13 NOTE — Patient Instructions (Signed)

## 2014-09-13 NOTE — Progress Notes (Signed)
[place] [phone]   Fax:([phone]) [place], [place] [address]  DIAGNOSIS: Recurrent multiple myeloma initially diagnosed in October 2008.   PRIOR THERAPY:  1. Status post palliative radiotherapy to the lumbar spine between L3 and L5. The patient received a total dose of 3000 cGy in 10 fractions under the care of Dr. Lisbeth Renshaw between May 05, 2007 through May 18, 2007. 2. Status post 5 cycles of systemic chemotherapy with Revlimid and low-dose Decadron with good response to this treatment. 3. Status post autologous peripheral blood stem cell transplant at [facility] on Nov 20, 2007 under the care of Dr. Valarie Merino. 4. Status post treatment for disease recurrence with Velcade, Doxil and Decadron. Last dose given Nov 09, 2009. Discontinued secondary to intolerance but the patient had a good response to treatment at that time. 5. Status post palliative radiotherapy to the T2-T6 thoracic vertebrae completed 03/21/2011 under the care of Dr. Lisbeth Renshaw. 6. Systemic chemotherapy with Velcade 1.3 mg per meter squared given on days 1, 4, 8 and 11, and Doxil at 30 mg per meter squared given on day 4 in addition to Decadron status post 4 cycles, discontinued secondary to intolerance. 7. Systemic therapy with Velcade 1.3 mg/M2 subcutaneously in addition to Decadron 40 mg by mouth on a weekly basis, status post 20 cycles. The patient had good response with this treatment but it is discontinue today secondary to worsening peripheral neuropathy. 8. Palliative radiotherapy to the skull lesion as well as the left hip area under the care of Dr. Lisbeth Renshaw. 9. Systemic chemotherapy with Carfilzomib 20 mg/M2 on days 1, 2,  8, 9, 13 and 16 every 4 weeks in addition to weekly Decadron 40 mg by mouth. First dose on 04/19/2013. Status post 4 cycles, discontinued recently secondary to cardiac  dysfunction.  CURRENT THERAPY:  1. Pomalyst 4 mg by mouth daily for 21 days every 4 weeks in addition to dexamethasone 40 mg on a weekly basis. First dose started 12/02/2013. Status post 9 cycles. She will start cycle #10 on 09/20/2014 2. Zometa 4 mg IV given every 3 months   INTERVAL HISTORY: [name] 64 y.o. female returns to the clinic today for follow up visit accompanied by her daughter. The patient is feeling fine today with no specific complaints except for the chronic back pain. She is tolerating her current treatment with Pomalyst fairly well with no significant adverse effects. She is expected to start cycle #10 next week. She denied having any significant fatigue or weakness. The patient denied having any significant chest pain, shortness of breath, or hemoptysis. She has no nausea or vomiting. She has no weight loss or night sweats. She continues to have mild peripheral neuropathy and currently on gabapentin.   MEDICAL HISTORY: Past Medical History  Diagnosis Date  . Hypercholesterolemia   . Compression fracture 04/08/2007    pathologic compression fracture  . Hypothyroidism   . FHx: chemotherapy     s/p 5 cycle revlimid/low dose decadron,s/p velcade,doxil,decadron,  . Hx of radiation therapy 05/05/07-05/18/07,& 03/05/11-03/21/11-    l3&l5 in 2008, t2-t6 03/2011  . GERD (gastroesophageal reflux disease)   . Insomnia     associated with steroids  . Constipation     takes oxycontin,vicodin  . Hx of radiation therapy 05/05/2007 to 05/18/2007    palliative, L3-5  . Hx of radiation therapy 03/05/2011 to 03/21/2011    palliative T2-T6,  c-spine  . 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 PONV (postoperative nausea and vomiting)   . Multiple myeloma dx'd 2009  . Metastasis to bone   . Family history of anesthesia complication     "daughter gets PONV too"  . Pneumonia     "several times"  . Stroke 2014    denies residual on 01/27/2014     ALLERGIES:  has No Known Allergies.  MEDICATIONS:  Current Outpatient Prescriptions  Medication Sig Dispense Refill  . ALPRAZolam (XANAX) 0.5 MG tablet TAKE 1 TABLET BY MOUTH AS NEEDED FOR ANXIETY 30 tablet 3  . aspirin 81 MG tablet Take 81 mg by mouth daily.    . baclofen (LIORESAL) 10 MG tablet Take 1 tablet (10 mg total) by mouth 2 (two) times daily. 60 tablet 3  . citalopram (CELEXA) 20 MG tablet Take 1 tablet (20 mg total) by mouth daily. 30 tablet 3  . cyclobenzaprine (FLEXERIL) 10 MG tablet Take 1/2-1 tablet TID PRN for muscle spasms (Patient taking differently: Take 5-10 mg by mouth 3 (three) times daily as needed. ) 90 tablet 0  . dexamethasone (DECADRON) 4 MG tablet TAKE 10 TABLETS BY MOUTH EVERY FRIDAY 40 tablet 1  . Eszopiclone 3 MG TABS Take 3 mg by mouth at bedtime as needed (sleep). Take immediately before bedtime    . famotidine (PEPCID) 20 MG tablet Take 1 tablet (20 mg total) by mouth 2 (two) times daily. 28 tablet 0  . fexofenadine (ALLEGRA) 60 MG tablet TAKE 1/2 TABLET BY MOUTH TWICE DAILY 15 tablet 1  . furosemide (LASIX) 40 MG tablet TAKE 1 TABLET BY MOUTH TWICE A DAY FOR FLUID AND SWELLING 60 tablet 10  . gabapentin (NEURONTIN) 600 MG tablet Take 600 mg by mouth 4 (four) times daily as needed. For pain or cramps    . HYDROcodone-acetaminophen (NORCO) 7.5-325 MG per tablet Take 1 tablet by mouth every 6 (six) hours as needed for moderate pain. 30 tablet 0  . hyoscyamine (LEVBID) 0.375 MG 12 hr tablet Take 1 tablet (0.375 mg total) by mouth 2 (two) times daily. 60 tablet 3  . hyoscyamine (LEVSIN SL) 0.125 MG SL tablet Place 1 tablet (0.125 mg total) under the tongue every 6 (six) hours as needed. (Patient taking differently: Place 0.125 mg under the tongue every 6 (six) hours as needed for cramping. ) 30 tablet 4  . KLOR-CON M10 10 MEQ tablet TAKE 1 TABLET BY MOUTH TWICE A DAY 60 tablet 3  . levothyroxine (SYNTHROID, LEVOTHROID) 100 MCG tablet Take 50-100 mcg by mouth  daily before breakfast. Take 0.5 tab (62mg) on Monday, Wednesday, and Friday. Take 1 tab (1081m) on Sunday, Tuesday, Thursday, and Saturday.    . morphine (MS CONTIN) 15 MG 12 hr tablet Take 1 tablet (15 mg total) by mouth every 12 (twelve) hours. 60 tablet 0  . Multiple Vitamin (MULITIVITAMIN WITH MINERALS) TABS Take 1 tablet by mouth every morning.     . ondansetron (ZOFRAN-ODT) 8 MG disintegrating tablet TAKE 1 TABLET (8 MG TOTAL) BY MOUTH EVERY 8 (EIGHT) HOURS AS NEEDED FOR NAUSEA OR VOMITING. 30 tablet 1  . pantoprazole (PROTONIX) 40 MG tablet Take 1 tablet (40 mg total) by mouth 2 (two) times daily. 60 tablet 3  . pomalidomide (POMALYST) 4 MG capsule Take 1 capsule (4 mg total) by mouth daily. Take with water on days 1-21. Repeat every 28 days. 21 capsule 0  . promethazine-dextromethorphan (PROMETHAZINE-DM)  6.25-15 MG/5ML syrup Take 1 teaspoon 4 x day ONLY if needed for severe cough (Patient not taking: Reported on 09/09/2014) 240 mL 0   No current facility-administered medications for this visit.    SURGICAL HISTORY:  Past Surgical History  Procedure Laterality Date  . Knee arthroscopy Right     "put pin in"  . Knee arthroscopy Right     "took pin out and corrected what was wrong"  . Video bronchoscopy  07/30/2011    Procedure: VIDEO BRONCHOSCOPY WITHOUT FLUORO;  Surgeon: Kathee Delton, MD;  Location: [facility];  Service: Cardiopulmonary;  Laterality: Bilateral;  . Cholecystectomy  1980's  . Portacath placement Right 2009  . Vaginal hysterectomy  1980's    REVIEW OF SYSTEMS:  Constitutional: negative Eyes: negative Ears, nose, mouth, throat, and face: negative Respiratory: negative Cardiovascular: negative Gastrointestinal: negative Genitourinary:negative Integument/breast: negative Hematologic/lymphatic: negative Musculoskeletal:positive for back pain Neurological: negative Behavioral/Psych: negative   PHYSICAL EXAMINATION: General appearance: alert, cooperative,  fatigued and no distress Head: Normocephalic, without obvious abnormality, atraumatic Neck: no adenopathy, no JVD, supple, symmetrical, trachea midline and thyroid not enlarged, symmetric, no tenderness/mass/nodules Lymph nodes: Cervical, supraclavicular, and axillary nodes normal. Resp: clear to auscultation bilaterally Back: symmetric, no curvature. ROM normal. No CVA tenderness. Cardio: regular rate and rhythm, S1, S2 normal, no murmur, click, rub or gallop GI: soft, non-tender; bowel sounds normal; no masses,  no organomegaly Extremities: extremities normal, atraumatic, no cyanosis or edema Neurologic: Alert and oriented X 3, normal strength and tone. Normal symmetric reflexes. Normal coordination and gait  ECOG PERFORMANCE STATUS: 1 - Symptomatic but completely ambulatory  Blood pressure 118/72, pulse 89, temperature 98.3 F (36.8 C), temperature source Oral, resp. rate 18, height 5' 4.75" (1.645 m), weight 198 lb 12.8 oz (90.175 kg).  LABORATORY DATA: Lab Results  Component Value Date   WBC 3.3* 09/13/2014   HGB 11.4* 09/13/2014   HCT 35.3 09/13/2014   MCV 99.4 09/13/2014   PLT 217 09/13/2014      Chemistry      Component Value Date/Time   NA 139 09/09/2014 1130   NA 142 08/16/2014 0947   K 3.9 09/09/2014 1130   K 3.3* 08/16/2014 0947   CL 103 09/09/2014 1130   CL 105 12/17/2012 1015   CO2 24 09/09/2014 1130   CO2 27 08/16/2014 0947   BUN 9 09/09/2014 1130   BUN 15.1 08/16/2014 0947   CREATININE 0.85 09/09/2014 1130   CREATININE 0.8 08/16/2014 0947   CREATININE 1.14* 08/06/2014 0203      Component Value Date/Time   CALCIUM 8.7 09/09/2014 1130   CALCIUM 9.1 08/16/2014 0947   ALKPHOS 83 09/09/2014 1130   ALKPHOS 67 08/16/2014 0947   AST 13 09/09/2014 1130   AST 8 08/16/2014 0947   ALT 11 09/09/2014 1130   ALT 15 08/16/2014 0947   BILITOT 0.6 09/09/2014 1130   BILITOT 0.96 08/16/2014 0947     Other lab results: Beta-2 microglobulin 2.69, free kappa light  chain 2.41, free lambda light chain 7.26 and a kappa/lambda ratio of 0.33. IgG 995, IgA 139 and IgM 20.  RADIOGRAPHIC STUDIES:  ASSESSMENT AND PLAN:  This is a very pleasant 64 years old [place] American female with recurrent multiple myeloma recently completed a course of treatment with Velcade and Decadron with improvement in her disease but this was discontinued secondary to peripheral neuropathy. The patient tolerated her treatment with Carfilzomib and Decadron fairly well except for the recent shortness breath and cough which was  felt to be secondary to congestive heart failure from her treatment with Carfilzomib. This treatment was discontinued. She was started on treatment with Pomalyst and Decadron status post 9 cycles. She tolerated the last cycle of her treatment well. I recommended for her to continue her treatment and she will start cycle #10 next week.  She would come back for followup visit in 4 weeks for evaluation after repeating myeloma panel. The patient will continue her current treatment with Zometa every 3 months as scheduled. She will receive a dose of Zometa today. She was advised to call immediately if she has any concerning symptoms in the interval. The patient voices understanding of current disease status and treatment options and is in agreement with the current care plan.  All questions were answered. The patient knows to call the clinic with any problems, questions or concerns. We can certainly see the patient much sooner if necessary.  Disclaimer: This note was dictated with voice recognition software. Similar sounding words can inadvertently be transcribed and may be missed upon review.

## 2014-09-13 NOTE — Progress Notes (Signed)
Prescriptions locked up

## 2014-09-13 NOTE — Telephone Encounter (Signed)
Pt confirmed labs/ov per 03/15 POF, gave pt AVS..... KJ, sent msg to add chemo

## 2014-09-13 NOTE — Telephone Encounter (Signed)
Per staff message and POF I have scheduled appts. Advised scheduler of appts. JMW  

## 2014-09-14 ENCOUNTER — Other Ambulatory Visit: Payer: Self-pay | Admitting: Internal Medicine

## 2014-09-15 ENCOUNTER — Encounter: Payer: Self-pay | Admitting: Internal Medicine

## 2014-09-15 ENCOUNTER — Ambulatory Visit (INDEPENDENT_AMBULATORY_CARE_PROVIDER_SITE_OTHER): Payer: Medicare Other | Admitting: Internal Medicine

## 2014-09-15 VITALS — BP 122/75 | HR 77 | Ht 64.0 in | Wt 198.0 lb

## 2014-09-15 DIAGNOSIS — R0600 Dyspnea, unspecified: Secondary | ICD-10-CM

## 2014-09-15 NOTE — Progress Notes (Signed)
Cardiology Office Note   Date:  09/15/2014   ID:  [name], DOB 09/12/1950, MRN [id number]  PCP:  [name], MD  Cardiologist:   Dorris Carnes, MD   Chief Complaint  Patient presents with  . Advice Only      History of Present Illness: [name] is a 64 y.o. female referred for SOB and edema   Swelling in legs for 6 months, appear to be getting worse. Breathign gets short Has gone on for awhile  Getting worse   Sleeping all day  Tired   Sleeps with 2 pillows  Chronic    Now being treated for URI  Cough productive of grerenish sputum      Current Outpatient Prescriptions  Medication Sig Dispense Refill  . ALPRAZolam (XANAX) 0.5 MG tablet TAKE 1 TABLET BY MOUTH AS NEEDED FOR ANXIETY 30 tablet 3  . aspirin 81 MG tablet Take 81 mg by mouth daily.    . baclofen (LIORESAL) 10 MG tablet Take 1 tablet (10 mg total) by mouth 2 (two) times daily. 60 tablet 3  . cyclobenzaprine (FLEXERIL) 10 MG tablet Take 1/2-1 tablet TID PRN for muscle spasms (Patient taking differently: Take 5-10 mg by mouth 3 (three) times daily as needed. ) 90 tablet 0  . dexamethasone (DECADRON) 4 MG tablet TAKE 10 TABLETS BY MOUTH EVERY FRIDAY 40 tablet 1  . Eszopiclone 3 MG TABS Take 3 mg by mouth at bedtime as needed (sleep). Take immediately before bedtime    . famotidine (PEPCID) 20 MG tablet Take 1 tablet (20 mg total) by mouth 2 (two) times daily. 28 tablet 0  . fexofenadine (ALLEGRA) 60 MG tablet TAKE 1/2 TABLET BY MOUTH TWICE DAILY 15 tablet 1  . furosemide (LASIX) 40 MG tablet TAKE 1 TABLET BY MOUTH TWICE A DAY FOR FLUID AND SWELLING 60 tablet 10  . gabapentin (NEURONTIN) 600 MG tablet Take 600 mg by mouth 4 (four) times daily as needed. For pain or cramps    . hyoscyamine (LEVBID) 0.375 MG 12 hr tablet Take 1 tablet (0.375 mg total) by mouth 2 (two) times daily. 60 tablet 3  . hyoscyamine (LEVSIN SL) 0.125 MG SL tablet Place 1 tablet (0.125 mg total) under the tongue every 6  (six) hours as needed. (Patient taking differently: Place 0.125 mg under the tongue every 6 (six) hours as needed for cramping. ) 30 tablet 4  . KLOR-CON M10 10 MEQ tablet TAKE 1 TABLET BY MOUTH TWICE A DAY 60 tablet 3  . levothyroxine (SYNTHROID, LEVOTHROID) 100 MCG tablet Take 50-100 mcg by mouth daily before breakfast. Take 0.5 tab (75mg) on Monday, Wednesday, and Friday. Take 1 tab (1019m) on Sunday, Tuesday, Thursday, and Saturday.    . Multiple Vitamin (MULITIVITAMIN WITH MINERALS) TABS Take 1 tablet by mouth every morning.     . pantoprazole (PROTONIX) 40 MG tablet TAKE 1 TABLET (40 MG TOTAL) BY MOUTH 2 (TWO) TIMES DAILY. 60 tablet 5  . pomalidomide (POMALYST) 4 MG capsule Take 1 capsule (4 mg total) by mouth daily. Take with water on days 1-21. Repeat every 28 days. 21 capsule 0  . promethazine-dextromethorphan (PROMETHAZINE-DM) 6.25-15 MG/5ML syrup Take 1 teaspoon 4 x day ONLY if needed for severe cough 240 mL 0  . HYDROcodone-acetaminophen (NORCO) 7.5-325 MG per tablet Take 1 tablet by mouth every 6 (six) hours as needed for moderate pain. (Patient not taking: Reported on 09/15/2014) 30 tablet 0  . morphine (MS CONTIN) 15 MG 12  hr tablet Take 1 tablet (15 mg total) by mouth every 12 (twelve) hours. (Patient not taking: Reported on 09/15/2014) 60 tablet 0  . ondansetron (ZOFRAN-ODT) 8 MG disintegrating tablet TAKE 1 TABLET (8 MG TOTAL) BY MOUTH EVERY 8 (EIGHT) HOURS AS NEEDED FOR NAUSEA OR VOMITING. (Patient not taking: Reported on 09/15/2014) 30 tablet 1   No current facility-administered medications for this visit.    Allergies:   Review of patient's allergies indicates no known allergies.   Past Medical History  Diagnosis Date  . Hypercholesterolemia   . Compression fracture 04/08/2007    pathologic compression fracture  . Hypothyroidism   . FHx: chemotherapy     s/p 5 cycle revlimid/low dose decadron,s/p velcade,doxil,decadron,  . Hx of radiation therapy 05/05/07-05/18/07,&  03/05/11-03/21/11-    l3&l5 in 2008, t2-t6 03/2011  . GERD (gastroesophageal reflux disease)   . Insomnia     associated with steroids  . Constipation     takes oxycontin,vicodin  . Hx of radiation therapy 05/05/2007 to 05/18/2007    palliative, L3-5  . Hx of radiation therapy 03/05/2011 to 03/21/2011    palliative T2-T6, c-spine  . 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 PONV (postoperative nausea and vomiting)   . Multiple myeloma dx'd 2009  . Metastasis to bone   . Family history of anesthesia complication     "daughter gets PONV too"  . Pneumonia     "several times"  . Stroke 2014    denies residual on 01/27/2014    Past Surgical History  Procedure Laterality Date  . Knee arthroscopy Right     "put pin in"  . Knee arthroscopy Right     "took pin out and corrected what was wrong"  . Video bronchoscopy  07/30/2011    Procedure: VIDEO BRONCHOSCOPY WITHOUT FLUORO;  Surgeon: Kathee Delton, MD;  Location: [facility];  Service: Cardiopulmonary;  Laterality: Bilateral;  . Cholecystectomy  1980's  . Portacath placement Right 2009  . Vaginal hysterectomy  1980's     Social History:  The patient  reports that she quit smoking about 36 years ago. Her smoking use included Cigarettes. She has a 1.5 pack-year smoking history. She has never used smokeless tobacco. She reports that she does not drink alcohol or use illicit drugs.   Family History:  The patient's family history includes Breast cancer in her maternal grandmother; Colon cancer in her maternal uncle; Lung cancer in her brother.    ROS:  Please see the history of present illness. All other systems are reviewed and  Negative to the above problem except as noted.    PHYSICAL EXAM: VS:  BP 122/75 mmHg  Pulse 77  Ht 5' 4"  (1.626 m)  Wt 198 lb (89.812 kg)  BMI 33.97 kg/m2  GEN: Well nourished, well developed, in no acute distress HEENT: normal Neck: no JVD, carotid bruits, or  masses Cardiac: RRR; no murmurs, rubs, or gallops,no edema  Respiratory:  clear to auscultation bilaterally, normal work of breathing GI: soft, nontender, nondistended, + BS  No hepatomegaly  MS: no deformity Moving all extremities   Skin: warm and dry, no rash Neuro:  Strength and sensation are intact Psych: euthymic mood, full affect   EKG:  EKG is ordered today.  SR 68 bpm     Lipid Panel    Component Value Date/Time   CHOL 179 09/09/2014 1130   TRIG 303* 09/09/2014 1130   HDL 47 09/09/2014 1130  CHOLHDL 3.8 09/09/2014 1130   VLDL 61* 09/09/2014 1130   LDLCALC 71 09/09/2014 1130      Wt Readings from Last 3 Encounters:  09/15/14 198 lb (89.812 kg)  09/13/14 198 lb 12.8 oz (90.175 kg)  09/09/14 207 lb (93.895 kg)      ASSESSMENT AND PLAN:   1Dyspnea  Exam without evid of volume overload  Will set up for echo to evaluate LV function/strain.  Compare to previous echo  If neg consider stress testing      Current medicines are reviewed at length with the patient today.  The patient does not have concerns regarding medicines.  The following changes have been made: No change    Labs/ tests ordered today include:  Echo   No orders of the defined types were placed in this encounter.     Disposition:   FU will depend on test results    Signed, Dorris Carnes, MD  09/15/2014 10:41 AM    [facility] [address], [place], [place]  [address] Phone: [phone]; Fax: [phone]

## 2014-09-15 NOTE — Patient Instructions (Signed)
Your physician recommends that you continue on your current medications as directed. Please refer to the Current Medication list given to you today. Your physician has requested that you have an echocardiogram. Echocardiography is a painless test that uses sound waves to create images of your heart. It provides your doctor with information about the size and shape of your heart and how well your heart's Sorey and valves are working. This procedure takes approximately one hour. There are no restrictions for this procedure.

## 2014-09-20 ENCOUNTER — Encounter: Payer: Self-pay | Admitting: *Deleted

## 2014-09-20 ENCOUNTER — Ambulatory Visit (HOSPITAL_COMMUNITY): Payer: Medicare Other | Attending: Cardiology

## 2014-09-20 DIAGNOSIS — E785 Hyperlipidemia, unspecified: Secondary | ICD-10-CM | POA: Insufficient documentation

## 2014-09-20 DIAGNOSIS — I10 Essential (primary) hypertension: Secondary | ICD-10-CM | POA: Diagnosis not present

## 2014-09-20 DIAGNOSIS — Z87891 Personal history of nicotine dependence: Secondary | ICD-10-CM | POA: Insufficient documentation

## 2014-09-20 DIAGNOSIS — R0600 Dyspnea, unspecified: Secondary | ICD-10-CM

## 2014-09-20 NOTE — Progress Notes (Signed)
Received confirmation from [facility] that Pomalyst shipped on 09/19/14.

## 2014-09-20 NOTE — Progress Notes (Signed)
2D Echo completed. 09/20/2014

## 2014-09-27 ENCOUNTER — Telehealth: Payer: Self-pay | Admitting: Internal Medicine

## 2014-09-27 DIAGNOSIS — R0600 Dyspnea, unspecified: Secondary | ICD-10-CM

## 2014-09-27 NOTE — Telephone Encounter (Signed)
Notes Recorded by Rodman Key, RN on 09/22/2014 at 12:20 PM Informed patient. She asked that I call her daughter [name] [phone] to inform and discuss stress test. Called daughter. Left message for her to call back. Notes Recorded by Fay Records, MD on 09/20/2014 at 1:28 PM Echo is stable from previous Normal systolic function . Very mild diastolic dysfunction With dyspnea I would set up for lexiscan myoview once she is over her URI          SPOKE WITH PATIENT TODAY. SHE IS OVER HER URI SHE WOULD LIKE TO SCHEDULE THE STRESS TEST. REVIEWED INSTRUCTIONS FOR THE TEST WITH HER AND ADVISED I WILL MAIL TO HER ONCE THE TEST IS SCHEDULED. ADVISED PT THAT I LEFT A MESSAGE FOR HER DAUGHTER TO CALL BACK AS SHE REQUESTED BUT HAVE NOT HEARD BACK FROM HER.   PT HAS NO OTHER QUESTIONS AT THIS TIME.

## 2014-09-27 NOTE — Telephone Encounter (Signed)
New Message    Patient is calling to try to schedule a stress test. I don't see any orders in, in regards to a stress test. Please give patient a call because she is unsure of what to do.  Thanks

## 2014-10-03 ENCOUNTER — Other Ambulatory Visit: Payer: Self-pay | Admitting: *Deleted

## 2014-10-03 DIAGNOSIS — C9000 Multiple myeloma not having achieved remission: Secondary | ICD-10-CM

## 2014-10-03 MED ORDER — POMALIDOMIDE 4 MG PO CAPS: 4.0000 mg | ORAL_CAPSULE | Freq: Every day | ORAL | Status: DC

## 2014-10-04 ENCOUNTER — Telehealth: Payer: Self-pay | Admitting: *Deleted

## 2014-10-04 NOTE — Telephone Encounter (Signed)
Received fax from [employer]. Will verify insurance and make delivery arrangements

## 2014-10-05 ENCOUNTER — Encounter: Payer: Self-pay | Admitting: Cardiovascular Disease

## 2014-10-05 ENCOUNTER — Encounter: Payer: Self-pay | Admitting: Cardiology

## 2014-10-06 ENCOUNTER — Ambulatory Visit (HOSPITAL_COMMUNITY): Payer: Medicare Other | Attending: Cardiology | Admitting: Radiology

## 2014-10-06 DIAGNOSIS — R0600 Dyspnea, unspecified: Secondary | ICD-10-CM | POA: Diagnosis not present

## 2014-10-06 MED ORDER — TECHNETIUM TC 99M SESTAMIBI GENERIC - CARDIOLITE
33.0000 | Freq: Once | INTRAVENOUS | Status: AC | PRN
  Administered 2014-10-06: 33 via INTRAVENOUS

## 2014-10-06 MED ORDER — TECHNETIUM TC 99M SESTAMIBI GENERIC - CARDIOLITE
11.0000 | Freq: Once | INTRAVENOUS | Status: AC | PRN
  Administered 2014-10-06: 11 via INTRAVENOUS

## 2014-10-06 MED ORDER — REGADENOSON 0.4 MG/5ML IV SOLN
0.4000 mg | Freq: Once | INTRAVENOUS | Status: AC
  Administered 2014-10-06: 0.4 mg via INTRAVENOUS

## 2014-10-06 NOTE — Progress Notes (Signed)
[facility] [place], [place] [address] [phone]    Cardiology Nuclear Med Study  [name] is a 64 y.o. female     MRN : [id number]     DOB: 1951-02-27  Procedure Date: 10/06/2014  Nuclear Med Background Indication for Stress Test:  Evaluation for Ischemia History:  n/a Cardiac Risk Factors: N/A  Symptoms:  DOE   Nuclear Pre-Procedure Caffeine/Decaff Intake:  None NPO After: 7:00pm   Lungs:  clear O2 Sat: 97% on room air. IV 0.9% NS with Angio Cath:  22g  IV Site: R Hand  IV Started by:  Crissie Figures, RN  Chest Size (in):  40 Cup Size: B  Height: 5\' 4"  (1.626 m)  Weight:  204 lb (92.534 kg)  BMI:  Body mass index is 35 kg/(m^2). Tech Comments:  N/A    Nuclear Med Study 1 or 2 day study: 1 day  Stress Test Type:  Lexiscan  Reading MD: N/A  Order Authorizing Provider:  Dorris Carnes, MD  Resting Radionuclide: Technetium 67m Sestamibi  Resting Radionuclide Dose: 11.0 mCi   Stress Radionuclide:  Technetium 30m Sestamibi  Stress Radionuclide Dose: 33.0 mCi           Stress Protocol Rest HR: 60 Stress HR: 74  Rest BP: 108/52 Stress BP: 108/54  Exercise Time (min): n/a METS: n/a   Predicted Max HR: 157 bpm % Max HR: 47.13 bpm Rate Pressure Product: 7992   Dose of Adenosine (mg):  n/a Dose of Lexiscan: 0.4 mg  Dose of Atropine (mg): n/a Dose of Dobutamine: n/a mcg/kg/min (at max HR)  Stress Test Technologist: Perrin Maltese, EMT-P  Nuclear Technologist:  Earl Many, CNMT     Rest Procedure:  Myocardial perfusion imaging was performed at rest 45 minutes following the intravenous administration of Technetium 27m Sestamibi. Rest ECG: Sinus bradycardia with nonspecific ST-T wave changes  Stress Procedure:  The patient received IV Lexiscan 0.4 mg over 15-seconds.  Technetium 82m Sestamibi injected at 30-seconds. This patient was sob with the Lexiscan injection. Quantitative spect images were obtained after a 45 minute  delay. Stress ECG: No significant change from baseline ECG  QPS Raw Data Images:  Normal; no motion artifact; normal heart/lung ratio. Stress Images:  There is mild decreased uptake along the inferolateral basal to mid portion mostly at stress which is likely secondary to bowel loop attenuation artifact that is inhibiting the inferolateral portion of the myocardium. Sensitivity and specificity of the study are reduced by noted bowel loop attenuation. Rest Images:  As above. Mild bowel loop attenuation noted in basilar images. Otherwise homogeneous radiotracer uptake Subtraction (SDS):  No evidence of ischemia. Transient Ischemic Dilatation (Normal <1.22):  1.10 Lung/Heart Ratio (Normal <0.45):  0.28  Quantitative Gated Spect Images QGS EDV:  96 ml QGS ESV:  37 ml  Impression Exercise Capacity:  Lexiscan with no exercise. BP Response:  Normal blood pressure response. Clinical Symptoms:  Mild shortness of breath and cough with infusion. ECG Impression:  No significant ST segment change suggestive of ischemia. Comparison with Prior Nuclear Study: No images to compare  Overall Impression:  Low risk stress nuclear study with mild area of decreased radiotracer uptake in the inferolateral distribution base to mid which is likely secondary to bowel loop attenuation artifact. Sensitivity and specificity of study are reduced by noted attenuation..  LV Ejection Fraction: 61%.  LV Wall Motion:  NL LV Function; NL Wall Motion  [name], MD

## 2014-10-07 ENCOUNTER — Other Ambulatory Visit (HOSPITAL_BASED_OUTPATIENT_CLINIC_OR_DEPARTMENT_OTHER): Payer: Medicare Other

## 2014-10-07 ENCOUNTER — Other Ambulatory Visit: Payer: Self-pay

## 2014-10-07 ENCOUNTER — Other Ambulatory Visit: Payer: Self-pay | Admitting: Internal Medicine

## 2014-10-07 ENCOUNTER — Telehealth: Payer: Self-pay | Admitting: *Deleted

## 2014-10-07 DIAGNOSIS — Z1231 Encounter for screening mammogram for malignant neoplasm of breast: Secondary | ICD-10-CM

## 2014-10-07 DIAGNOSIS — C9000 Multiple myeloma not having achieved remission: Secondary | ICD-10-CM | POA: Diagnosis not present

## 2014-10-07 DIAGNOSIS — C9002 Multiple myeloma in relapse: Secondary | ICD-10-CM

## 2014-10-07 LAB — COMPREHENSIVE METABOLIC PANEL (CC13)
ALBUMIN: 3.5 g/dL (ref 3.5–5.0)
ALT: 12 U/L (ref 0–55)
AST: 12 U/L (ref 5–34)
Alkaline Phosphatase: 79 U/L (ref 40–150)
Anion Gap: 13 mEq/L — ABNORMAL HIGH (ref 3–11)
BUN: 9.8 mg/dL (ref 7.0–26.0)
CO2: 23 meq/L (ref 22–29)
Calcium: 8.5 mg/dL (ref 8.4–10.4)
Chloride: 107 mEq/L (ref 98–109)
Creatinine: 0.9 mg/dL (ref 0.6–1.1)
EGFR: 82 mL/min/{1.73_m2} — AB (ref >90)
Glucose: 113 mg/dl (ref 70–140)
Potassium: 3.6 mEq/L (ref 3.5–5.1)
SODIUM: 143 meq/L (ref 136–145)
TOTAL PROTEIN: 7.2 g/dL (ref 6.4–8.3)
Total Bilirubin: 1.01 mg/dL (ref 0.20–1.20)

## 2014-10-07 LAB — CBC WITH DIFFERENTIAL/PLATELET
BASO%: 0.9 % (ref 0.0–2.0)
Basophils Absolute: 0 10*3/uL (ref 0.0–0.1)
EOS ABS: 0.1 10*3/uL (ref 0.0–0.5)
EOS%: 2.6 % (ref 0.0–7.0)
HEMATOCRIT: 36 % (ref 34.8–46.6)
HEMOGLOBIN: 11.4 g/dL — AB (ref 11.6–15.9)
LYMPH#: 0.6 10*3/uL — AB (ref 0.9–3.3)
LYMPH%: 19.9 % (ref 14.0–49.7)
MCH: 31.1 pg (ref 25.1–34.0)
MCHC: 31.6 g/dL (ref 31.5–36.0)
MCV: 98.2 fL (ref 79.5–101.0)
MONO#: 0.1 10*3/uL (ref 0.1–0.9)
MONO%: 3.6 % (ref 0.0–14.0)
NEUT%: 73 % (ref 38.4–76.8)
NEUTROS ABS: 2.3 10*3/uL (ref 1.5–6.5)
PLATELETS: 246 10*3/uL (ref 145–400)
RBC: 3.66 10*6/uL — ABNORMAL LOW (ref 3.70–5.45)
RDW: 17.2 % — ABNORMAL HIGH (ref 11.2–14.5)
WBC: 3.2 10*3/uL — ABNORMAL LOW (ref 3.9–10.3)

## 2014-10-07 LAB — LACTATE DEHYDROGENASE (CC13): LDH: 164 U/L (ref 125–245)

## 2014-10-07 MED ORDER — DEXAMETHASONE 4 MG PO TABS: ORAL_TABLET | ORAL | Status: DC

## 2014-10-07 NOTE — Telephone Encounter (Signed)
REFILL COMPLETED.

## 2014-10-10 ENCOUNTER — Telehealth: Payer: Self-pay | Admitting: *Deleted

## 2014-10-10 LAB — BETA 2 MICROGLOBULIN, SERUM: Beta-2 Microglobulin: 2.46 mg/L (ref <=2.51)

## 2014-10-10 LAB — KAPPA/LAMBDA LIGHT CHAINS
KAPPA FREE LGHT CHN: 3.15 mg/dL — AB (ref 0.33–1.94)
Kappa:Lambda Ratio: 0.39 (ref 0.26–1.65)
Lambda Free Lght Chn: 8.01 mg/dL — ABNORMAL HIGH (ref 0.57–2.63)

## 2014-10-10 LAB — IGG, IGA, IGM
IGA: 185 mg/dL (ref 69–380)
IgG (Immunoglobin G), Serum: 1400 mg/dL (ref 690–1700)
IgM, Serum: 22 mg/dL — ABNORMAL LOW (ref 52–322)

## 2014-10-10 NOTE — Telephone Encounter (Addendum)
[facility] faxed ASAP Pomalyst refill request.  wouldl ike response by 5:00 pm.  Request to provider's desk/in-basket for review.  Signature obtained.

## 2014-10-11 ENCOUNTER — Telehealth: Payer: Self-pay | Admitting: *Deleted

## 2014-10-11 NOTE — Telephone Encounter (Signed)
PT. RECEIVED HER POMALYST FROM [facility] ON 10/10/14.

## 2014-10-11 NOTE — Telephone Encounter (Signed)
After noting authorization number was obtained on 10-03-2014 with [facility] as pharmacy per [facility] site.  This nurse called patient yesterday at 4:55 and left voicemail.  Patient called reporting she has received the Pomalyst on 10-10-2014.  The ASAP request from [facility] shredded.  [facility] site lists [facility] as pharmacy and this is who patient received shipment from.Marland Kitchen

## 2014-10-12 DIAGNOSIS — Z79899 Other long term (current) drug therapy: Secondary | ICD-10-CM | POA: Diagnosis not present

## 2014-10-12 DIAGNOSIS — Z7982 Long term (current) use of aspirin: Secondary | ICD-10-CM | POA: Diagnosis not present

## 2014-10-12 DIAGNOSIS — R5383 Other fatigue: Secondary | ICD-10-CM | POA: Diagnosis not present

## 2014-10-12 DIAGNOSIS — R197 Diarrhea, unspecified: Secondary | ICD-10-CM | POA: Diagnosis not present

## 2014-10-12 DIAGNOSIS — Z8673 Personal history of transient ischemic attack (TIA), and cerebral infarction without residual deficits: Secondary | ICD-10-CM | POA: Diagnosis not present

## 2014-10-12 DIAGNOSIS — I10 Essential (primary) hypertension: Secondary | ICD-10-CM | POA: Diagnosis not present

## 2014-10-12 DIAGNOSIS — C9000 Multiple myeloma not having achieved remission: Secondary | ICD-10-CM | POA: Diagnosis not present

## 2014-10-12 DIAGNOSIS — G629 Polyneuropathy, unspecified: Secondary | ICD-10-CM | POA: Diagnosis not present

## 2014-10-12 DIAGNOSIS — G893 Neoplasm related pain (acute) (chronic): Secondary | ICD-10-CM | POA: Diagnosis not present

## 2014-10-13 ENCOUNTER — Telehealth: Payer: Self-pay | Admitting: Internal Medicine

## 2014-10-13 ENCOUNTER — Telehealth: Payer: Self-pay | Admitting: *Deleted

## 2014-10-13 ENCOUNTER — Ambulatory Visit (HOSPITAL_BASED_OUTPATIENT_CLINIC_OR_DEPARTMENT_OTHER): Payer: Medicare Other | Admitting: Internal Medicine

## 2014-10-13 ENCOUNTER — Other Ambulatory Visit: Payer: Self-pay | Admitting: Medical Oncology

## 2014-10-13 ENCOUNTER — Encounter: Payer: Self-pay | Admitting: Internal Medicine

## 2014-10-13 VITALS — BP 124/62 | HR 78 | Temp 98.2°F | Resp 18 | Ht 64.0 in | Wt 204.1 lb

## 2014-10-13 DIAGNOSIS — M546 Pain in thoracic spine: Secondary | ICD-10-CM

## 2014-10-13 DIAGNOSIS — C9000 Multiple myeloma not having achieved remission: Secondary | ICD-10-CM

## 2014-10-13 DIAGNOSIS — C9002 Multiple myeloma in relapse: Secondary | ICD-10-CM

## 2014-10-13 MED ORDER — MORPHINE SULFATE ER 15 MG PO TBCR: 15.0000 mg | EXTENDED_RELEASE_TABLET | Freq: Two times a day (BID) | ORAL | Status: DC

## 2014-10-13 MED ORDER — HYDROCODONE-ACETAMINOPHEN 7.5-325 MG PO TABS: 1.0000 | ORAL_TABLET | Freq: Four times a day (QID) | ORAL | Status: DC | PRN

## 2014-10-13 NOTE — Telephone Encounter (Signed)
PT. IS SCHEDULED ON 12/15/14 FOR THE ABOVE DENTAL WORK. DR.MILLSAPS NEEDS DENTAL CLEARANCE WHICH CAN BE VERBAL.

## 2014-10-13 NOTE — Progress Notes (Signed)
[facility] [phone]   Fax:([phone]) [place], [place] [address]  DIAGNOSIS: Recurrent multiple myeloma initially diagnosed in October 2008.   PRIOR THERAPY:  1. Status post palliative radiotherapy to the lumbar spine between L3 and L5. The patient received a total dose of 3000 cGy in 10 fractions under the care of Dr. Lisbeth Renshaw between May 05, 2007 through May 18, 2007. 2. Status post 5 cycles of systemic chemotherapy with Revlimid and low-dose Decadron with good response to this treatment. 3. Status post autologous peripheral blood stem cell transplant at [facility] on Nov 20, 2007 under the care of Dr. Valarie Merino. 4. Status post treatment for disease recurrence with Velcade, Doxil and Decadron. Last dose given Nov 09, 2009. Discontinued secondary to intolerance but the patient had a good response to treatment at that time. 5. Status post palliative radiotherapy to the T2-T6 thoracic vertebrae completed 03/21/2011 under the care of Dr. Lisbeth Renshaw. 6. Systemic chemotherapy with Velcade 1.3 mg per meter squared given on days 1, 4, 8 and 11, and Doxil at 30 mg per meter squared given on day 4 in addition to Decadron status post 4 cycles, discontinued secondary to intolerance. 7. Systemic therapy with Velcade 1.3 mg/M2 subcutaneously in addition to Decadron 40 mg by mouth on a weekly basis, status post 20 cycles. The patient had good response with this treatment but it is discontinue today secondary to worsening peripheral neuropathy. 8. Palliative radiotherapy to the skull lesion as well as the left hip area under the care of Dr. Lisbeth Renshaw. 9. Systemic chemotherapy with Carfilzomib 20 mg/M2 on days 1, 2,  8, 9, 13 and 16 every 4 weeks in addition to weekly Decadron 40 mg by mouth. First dose on 04/19/2013. Status post 4 cycles, discontinued recently secondary to cardiac  dysfunction.  CURRENT THERAPY:  1. Pomalyst 4 mg by mouth daily for 21 days every 4 weeks in addition to dexamethasone 40 mg on a weekly basis. First dose started 12/02/2013. Status post 10 cycles. She will start cycle #11 on 10/18/2014 2. Zometa 4 mg IV given every 3 months   INTERVAL HISTORY: [name] 64 y.o. female returns to the clinic today for follow up visit accompanied by her daughter and mother. The patient is feeling fine today with no specific complaints except for mid and lower back pain. She is tolerating her current treatment with Pomalyst fairly well with no significant adverse effects. She denied having any significant fatigue or weakness. The patient denied having any significant chest pain, shortness of breath, or hemoptysis. She has no nausea or vomiting. She has no weight loss or night sweats. She continues to have mild peripheral neuropathy and currently on gabapentin. She had repeat myeloma panel performed recently and she is here for evaluation and discussion of her lab results.  MEDICAL HISTORY: Past Medical History  Diagnosis Date  . Hypercholesterolemia   . Compression fracture 04/08/2007    pathologic compression fracture  . Hypothyroidism   . FHx: chemotherapy     s/p 5 cycle revlimid/low dose decadron,s/p velcade,doxil,decadron,  . Hx of radiation therapy 05/05/07-05/18/07,& 03/05/11-03/21/11-    l3&l5 in 2008, t2-t6 03/2011  . GERD (gastroesophageal reflux disease)   . Insomnia     associated with steroids  . Constipation     takes oxycontin,vicodin  . Hx of radiation therapy 05/05/2007 to 05/18/2007    palliative, L3-5  .  Hx of radiation therapy 03/05/2011 to 03/21/2011    palliative T2-T6, c-spine  . 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 PONV (postoperative nausea and vomiting)   . Multiple myeloma dx'd 2009  . Metastasis to bone   . Family history of anesthesia complication     "daughter gets PONV too"  .  Pneumonia     "several times"  . Stroke 2014    denies residual on 01/27/2014    ALLERGIES:  has No Known Allergies.  MEDICATIONS:  Current Outpatient Prescriptions  Medication Sig Dispense Refill  . ALPRAZolam (XANAX) 0.5 MG tablet TAKE 1 TABLET BY MOUTH AS NEEDED FOR ANXIETY 30 tablet 3  . aspirin 81 MG tablet Take 81 mg by mouth daily.    . baclofen (LIORESAL) 10 MG tablet Take 1 tablet (10 mg total) by mouth 2 (two) times daily. 60 tablet 3  . cyclobenzaprine (FLEXERIL) 10 MG tablet Take 1/2-1 tablet TID PRN for muscle spasms (Patient taking differently: Take 5-10 mg by mouth 3 (three) times daily as needed. ) 90 tablet 0  . dexamethasone (DECADRON) 4 MG tablet TAKE 10 TABLETS BY MOUTH EVERY FRIDAY 40 tablet 1  . Eszopiclone 3 MG TABS Take 3 mg by mouth at bedtime as needed (sleep). Take immediately before bedtime    . famotidine (PEPCID) 20 MG tablet Take 1 tablet (20 mg total) by mouth 2 (two) times daily. 28 tablet 0  . fexofenadine (ALLEGRA) 60 MG tablet TAKE 1/2 TABLET BY MOUTH TWICE DAILY 15 tablet 1  . furosemide (LASIX) 40 MG tablet TAKE 1 TABLET BY MOUTH TWICE A DAY FOR FLUID AND SWELLING 60 tablet 10  . gabapentin (NEURONTIN) 600 MG tablet Take 600 mg by mouth 4 (four) times daily as needed. For pain or cramps    . HYDROcodone-acetaminophen (NORCO) 7.5-325 MG per tablet Take 1 tablet by mouth every 6 (six) hours as needed for moderate pain. (Patient not taking: Reported on 09/15/2014) 30 tablet 0  . hyoscyamine (LEVBID) 0.375 MG 12 hr tablet Take 1 tablet (0.375 mg total) by mouth 2 (two) times daily. 60 tablet 3  . hyoscyamine (LEVSIN SL) 0.125 MG SL tablet Place 1 tablet (0.125 mg total) under the tongue every 6 (six) hours as needed. (Patient taking differently: Place 0.125 mg under the tongue every 6 (six) hours as needed for cramping. ) 30 tablet 4  . KLOR-CON M10 10 MEQ tablet TAKE 1 TABLET BY MOUTH TWICE A DAY 60 tablet 3  . levothyroxine (SYNTHROID, LEVOTHROID) 100 MCG  tablet Take 50-100 mcg by mouth daily before breakfast. Take 0.5 tab (67mg) on Monday, Wednesday, and Friday. Take 1 tab (1010m) on Sunday, Tuesday, Thursday, and Saturday.    . morphine (MS CONTIN) 15 MG 12 hr tablet Take 1 tablet (15 mg total) by mouth every 12 (twelve) hours. (Patient not taking: Reported on 09/15/2014) 60 tablet 0  . Multiple Vitamin (MULITIVITAMIN WITH MINERALS) TABS Take 1 tablet by mouth every morning.     . ondansetron (ZOFRAN-ODT) 8 MG disintegrating tablet TAKE 1 TABLET (8 MG TOTAL) BY MOUTH EVERY 8 (EIGHT) HOURS AS NEEDED FOR NAUSEA OR VOMITING. (Patient not taking: Reported on 09/15/2014) 30 tablet 1  . pantoprazole (PROTONIX) 40 MG tablet TAKE 1 TABLET (40 MG TOTAL) BY MOUTH 2 (TWO) TIMES DAILY. 60 tablet 5  . pomalidomide (POMALYST) 4 MG capsule Take 1 capsule (4 mg total) by mouth daily. Take with water on days 1-21.  Repeat every 28 days. 21 capsule 0  . promethazine-dextromethorphan (PROMETHAZINE-DM) 6.25-15 MG/5ML syrup Take 1 teaspoon 4 x day ONLY if needed for severe cough 240 mL 0   No current facility-administered medications for this visit.    SURGICAL HISTORY:  Past Surgical History  Procedure Laterality Date  . Knee arthroscopy Right     "put pin in"  . Knee arthroscopy Right     "took pin out and corrected what was wrong"  . Video bronchoscopy  07/30/2011    Procedure: VIDEO BRONCHOSCOPY WITHOUT FLUORO;  Surgeon: Kathee Delton, MD;  Location: [facility];  Service: Cardiopulmonary;  Laterality: Bilateral;  . Cholecystectomy  1980's  . Portacath placement Right 2009  . Vaginal hysterectomy  1980's    REVIEW OF SYSTEMS:  Constitutional: negative Eyes: negative Ears, nose, mouth, throat, and face: negative Respiratory: negative Cardiovascular: negative Gastrointestinal: negative Genitourinary:negative Integument/breast: negative Hematologic/lymphatic: negative Musculoskeletal:positive for back pain Neurological: negative Behavioral/Psych:  negative   PHYSICAL EXAMINATION: General appearance: alert, cooperative, fatigued and no distress Head: Normocephalic, without obvious abnormality, atraumatic Neck: no adenopathy, no JVD, supple, symmetrical, trachea midline and thyroid not enlarged, symmetric, no tenderness/mass/nodules Lymph nodes: Cervical, supraclavicular, and axillary nodes normal. Resp: clear to auscultation bilaterally Back: symmetric, no curvature. ROM normal. No CVA tenderness. Cardio: regular rate and rhythm, S1, S2 normal, no murmur, click, rub or gallop GI: soft, non-tender; bowel sounds normal; no masses,  no organomegaly Extremities: extremities normal, atraumatic, no cyanosis or edema Neurologic: Alert and oriented X 3, normal strength and tone. Normal symmetric reflexes. Normal coordination and gait  ECOG PERFORMANCE STATUS: 1 - Symptomatic but completely ambulatory  Blood pressure 124/62, pulse 78, temperature 98.2 F (36.8 C), temperature source Oral, resp. rate 18, height 5' 4"  (1.626 m), weight 204 lb 1.6 oz (92.579 kg), SpO2 100 %.  LABORATORY DATA: Lab Results  Component Value Date   WBC 3.2* 10/07/2014   HGB 11.4* 10/07/2014   HCT 36.0 10/07/2014   MCV 98.2 10/07/2014   PLT 246 10/07/2014      Chemistry      Component Value Date/Time   NA 143 10/07/2014 1048   NA 139 09/09/2014 1130   K 3.6 10/07/2014 1048   K 3.9 09/09/2014 1130   CL 103 09/09/2014 1130   CL 105 12/17/2012 1015   CO2 23 10/07/2014 1048   CO2 24 09/09/2014 1130   BUN 9.8 10/07/2014 1048   BUN 9 09/09/2014 1130   CREATININE 0.9 10/07/2014 1048   CREATININE 0.85 09/09/2014 1130   CREATININE 1.14* 08/06/2014 0203      Component Value Date/Time   CALCIUM 8.5 10/07/2014 1048   CALCIUM 8.7 09/09/2014 1130   ALKPHOS 79 10/07/2014 1048   ALKPHOS 83 09/09/2014 1130   AST 12 10/07/2014 1048   AST 13 09/09/2014 1130   ALT 12 10/07/2014 1048   ALT 11 09/09/2014 1130   BILITOT 1.01 10/07/2014 1048   BILITOT 0.6  09/09/2014 1130     Other lab results: Beta-2 microglobulin 2.46, free kappa light chain 3.15, free lambda light chain 8.01 and a kappa/lambda ratio of 0.39. IgG 1400, IgA 185 and IgM 22.  RADIOGRAPHIC STUDIES:  ASSESSMENT AND PLAN:  This is a very pleasant 64 years old [place] American female with recurrent multiple myeloma recently completed a course of treatment with Velcade and Decadron with improvement in her disease but this was discontinued secondary to peripheral neuropathy. The patient tolerated her treatment with Carfilzomib and Decadron fairly well except for  the recent shortness breath and cough which was felt to be secondary to congestive heart failure from her treatment with Carfilzomib. This treatment was discontinued. She was started on treatment with Pomalyst and Decadron status post 10 cycles. She tolerated the last cycle of her treatment well. Her myeloma panel showed no significant evidence for disease progression. I discussed the lab result with the patient and her family. I recommended for her to continue her treatment and she will start cycle #11 next week.  For the persistent mid and lower back pain, I have the MRI of the thoracic and lumbar spine to rule out any disease progression or cord involvement. She would come back for followup visit in 4 weeks for evaluation after repeating CBC, comprehensive metabolic panel and LDH. The patient will continue her current treatment with Zometa every 3 months as scheduled. She will receive a dose of Zometa today. She was advised to call immediately if she has any concerning symptoms in the interval. The patient voices understanding of current disease status and treatment options and is in agreement with the current care plan.  All questions were answered. The patient knows to call the clinic with any problems, questions or concerns. We can certainly see the patient much sooner if necessary.  Disclaimer: This note was dictated with  voice recognition software. Similar sounding words can inadvertently be transcribed and may be missed upon review.

## 2014-10-13 NOTE — Telephone Encounter (Signed)
Appointments made and avs printed for patient °

## 2014-10-14 ENCOUNTER — Telehealth: Payer: Self-pay | Admitting: Internal Medicine

## 2014-10-14 ENCOUNTER — Other Ambulatory Visit: Payer: Self-pay | Admitting: Medical Oncology

## 2014-10-14 NOTE — Telephone Encounter (Addendum)
I called and left message for her to call back about what time her dentist appt is so we can schedule her labs before the dental appt.

## 2014-10-14 NOTE — Telephone Encounter (Signed)
pt called back and wanted to keep zometa at 11:15 and she will contact her dentist office. Mailed pt new sched

## 2014-10-21 ENCOUNTER — Other Ambulatory Visit: Payer: Self-pay

## 2014-10-21 MED ORDER — ESZOPICLONE 3 MG PO TABS: 3.0000 mg | ORAL_TABLET | Freq: Every evening | ORAL | Status: DC | PRN

## 2014-10-26 ENCOUNTER — Other Ambulatory Visit: Payer: Self-pay | Admitting: Medical Oncology

## 2014-10-26 DIAGNOSIS — C9000 Multiple myeloma not having achieved remission: Secondary | ICD-10-CM

## 2014-10-27 ENCOUNTER — Ambulatory Visit (HOSPITAL_COMMUNITY)
Admission: RE | Admit: 2014-10-27 | Discharge: 2014-10-27 | Disposition: A | Discharge status: Home / Self Care | Payer: Medicare Other | Source: Ambulatory Visit | Attending: Internal Medicine | Admitting: Internal Medicine

## 2014-10-27 ENCOUNTER — Ambulatory Visit (HOSPITAL_COMMUNITY): Payer: Medicare Other

## 2014-10-27 ENCOUNTER — Ambulatory Visit (INDEPENDENT_AMBULATORY_CARE_PROVIDER_SITE_OTHER): Payer: Medicare Other | Admitting: Physician Assistant

## 2014-10-27 ENCOUNTER — Encounter: Payer: Self-pay | Admitting: Physician Assistant

## 2014-10-27 VITALS — BP 128/72 | HR 80 | Temp 98.1°F | Resp 16 | Ht 64.0 in | Wt 202.0 lb

## 2014-10-27 DIAGNOSIS — F329 Major depressive disorder, single episode, unspecified: Secondary | ICD-10-CM | POA: Diagnosis not present

## 2014-10-27 DIAGNOSIS — T23202A Burn of second degree of left hand, unspecified site, initial encounter: Secondary | ICD-10-CM

## 2014-10-27 DIAGNOSIS — F32A Depression, unspecified: Secondary | ICD-10-CM

## 2014-10-27 DIAGNOSIS — M546 Pain in thoracic spine: Secondary | ICD-10-CM | POA: Insufficient documentation

## 2014-10-27 DIAGNOSIS — C9000 Multiple myeloma not having achieved remission: Secondary | ICD-10-CM | POA: Diagnosis not present

## 2014-10-27 MED ORDER — GADOBENATE DIMEGLUMINE 529 MG/ML IV SOLN
20.0000 mL | Freq: Once | INTRAVENOUS | Status: AC | PRN
  Administered 2014-10-27: 19 mL via INTRAVENOUS

## 2014-10-27 MED ORDER — CITALOPRAM HYDROBROMIDE 20 MG PO TABS: 20.0000 mg | ORAL_TABLET | Freq: Every day | ORAL | Status: DC

## 2014-10-27 MED ORDER — SILVER SULFADIAZINE 1 % EX CREA: 1.0000 "application " | TOPICAL_CREAM | Freq: Every day | CUTANEOUS | Status: DC

## 2014-10-27 NOTE — Progress Notes (Signed)
Subjective:    Patient ID: [name], female    DOB: [birth year], 64 y.o.   MRN: [id number]  HPI 64 y.o. female with history of MM treated with pomilidomide, with MRI lumbar/thoracic today pending, presents with burn x 1 week on left wrist. She has been washing it, putting neosporin on it but it has continued to be tender and was bleeding yesterday so presents for evaluation. Denies fever, chills, warmth, distal arm swelling, numbness, redness.   Daughter is here and states that the patient will cry randomly and is asking about a pill for depression. Patient does have chronic illness and does not want to do as much as she use to, does cry. Denies SI/HI.   Current Outpatient Prescriptions on File Prior to Visit  Medication Sig Dispense Refill  . ALPRAZolam (XANAX) 0.5 MG tablet TAKE 1 TABLET BY MOUTH AS NEEDED FOR ANXIETY 30 tablet 3  . aspirin 81 MG tablet Take 81 mg by mouth daily.    . baclofen (LIORESAL) 10 MG tablet Take 1 tablet (10 mg total) by mouth 2 (two) times daily. 60 tablet 3  . cyclobenzaprine (FLEXERIL) 10 MG tablet Take 1/2-1 tablet TID PRN for muscle spasms (Patient taking differently: Take 5-10 mg by mouth 3 (three) times daily as needed. ) 90 tablet 0  . dexamethasone (DECADRON) 4 MG tablet TAKE 10 TABLETS BY MOUTH EVERY FRIDAY 40 tablet 1  . Eszopiclone 3 MG TABS Take 1 tablet (3 mg total) by mouth at bedtime as needed (sleep). Take immediately before bedtime 30 tablet 5  . famotidine (PEPCID) 20 MG tablet Take 1 tablet (20 mg total) by mouth 2 (two) times daily. 28 tablet 0  . fexofenadine (ALLEGRA) 60 MG tablet TAKE 1/2 TABLET BY MOUTH TWICE DAILY 15 tablet 1  . furosemide (LASIX) 40 MG tablet TAKE 1 TABLET BY MOUTH TWICE A DAY FOR FLUID AND SWELLING 60 tablet 10  . gabapentin (NEURONTIN) 600 MG tablet Take 600 mg by mouth 4 (four) times daily as needed. For pain or cramps    . HYDROcodone-acetaminophen (NORCO) 7.5-325 MG per tablet Take 1 tablet by mouth every 6  (six) hours as needed for moderate pain. 30 tablet 0  . hyoscyamine (LEVBID) 0.375 MG 12 hr tablet Take 1 tablet (0.375 mg total) by mouth 2 (two) times daily. 60 tablet 3  . hyoscyamine (LEVSIN SL) 0.125 MG SL tablet Place 1 tablet (0.125 mg total) under the tongue every 6 (six) hours as needed. (Patient taking differently: Place 0.125 mg under the tongue every 6 (six) hours as needed for cramping. ) 30 tablet 4  . KLOR-CON M10 10 MEQ tablet TAKE 1 TABLET BY MOUTH TWICE A DAY 60 tablet 3  . levothyroxine (SYNTHROID, LEVOTHROID) 100 MCG tablet Take 50-100 mcg by mouth daily before breakfast. Take 0.5 tab (58mg) on Monday, Wednesday, and Friday. Take 1 tab (1070m) on Sunday, Tuesday, Thursday, and Saturday.    . morphine (MS CONTIN) 15 MG 12 hr tablet Take 1 tablet (15 mg total) by mouth every 12 (twelve) hours. 60 tablet 0  . Multiple Vitamin (MULITIVITAMIN WITH MINERALS) TABS Take 1 tablet by mouth every morning.     . ondansetron (ZOFRAN-ODT) 8 MG disintegrating tablet TAKE 1 TABLET (8 MG TOTAL) BY MOUTH EVERY 8 (EIGHT) HOURS AS NEEDED FOR NAUSEA OR VOMITING. 30 tablet 1  . pantoprazole (PROTONIX) 40 MG tablet TAKE 1 TABLET (40 MG TOTAL) BY MOUTH 2 (TWO) TIMES DAILY. 60 tablet 5  .  pomalidomide (POMALYST) 4 MG capsule Take 1 capsule (4 mg total) by mouth daily. Take with water on days 1-21. Repeat every 28 days. 21 capsule 0  . promethazine-dextromethorphan (PROMETHAZINE-DM) 6.25-15 MG/5ML syrup Take 1 teaspoon 4 x day ONLY if needed for severe cough 240 mL 0   No current facility-administered medications on file prior to visit.    Past Medical History  Diagnosis Date  . Hypercholesterolemia   . Compression fracture 04/08/2007    pathologic compression fracture  . Hypothyroidism   . FHx: chemotherapy     s/p 5 cycle revlimid/low dose decadron,s/p velcade,doxil,decadron,  . Hx of radiation therapy 05/05/07-05/18/07,& 03/05/11-03/21/11-    l3&l5 in 2008, t2-t6 03/2011  . GERD (gastroesophageal  reflux disease)   . Insomnia     associated with steroids  . Constipation     takes oxycontin,vicodin  . Hx of radiation therapy 05/05/2007 to 05/18/2007    palliative, L3-5  . Hx of radiation therapy 03/05/2011 to 03/21/2011    palliative T2-T6, c-spine  . 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 PONV (postoperative nausea and vomiting)   . Multiple myeloma dx'd 2009  . Metastasis to bone   . Family history of anesthesia complication     "daughter gets PONV too"  . Pneumonia     "several times"  . Stroke 2014    denies residual on 01/27/2014     Review of Systems  Constitutional: Negative for fever, chills and fatigue.  HENT: Negative for congestion, postnasal drip, sinus pressure, sore throat and voice change.   Eyes: Positive for redness. Negative for photophobia, pain, discharge, itching and visual disturbance.  Respiratory: Positive for shortness of breath. Negative for chest tightness and wheezing.   Cardiovascular: Positive for leg swelling. Negative for chest pain and palpitations.  Gastrointestinal: Positive for diarrhea. Negative for nausea, vomiting, abdominal pain, constipation, blood in stool and anal bleeding.  Genitourinary: Negative for dysuria, urgency, frequency, hematuria and difficulty urinating.  Musculoskeletal: Positive for myalgias and arthralgias.  Neurological: Positive for light-headedness.       Objective:   Physical Exam  Constitutional: She is oriented to person, place, and time. She appears well-developed and well-nourished. No distress.  HENT:  Head: Normocephalic and atraumatic.  Nose: No mucosal edema.  Mouth/Throat: Uvula is midline and oropharynx is clear and moist. No oropharyngeal exudate, posterior oropharyngeal edema or posterior oropharyngeal erythema.  Eyes: Conjunctivae and EOM are normal. Pupils are equal, round, and reactive to light. No scleral icterus.  Neck: Normal range of motion. Neck supple. No  JVD present. No thyromegaly present.  Cardiovascular: Normal rate, regular rhythm, normal heart sounds and intact distal pulses.  Exam reveals no gallop and no friction rub.   No murmur heard. Negative Holmans sign bilaterally.  There is 2-3 + pitting edema bilaterally.  Left leg circumference is slightly larger than right.    Pulmonary/Chest: Effort normal and breath sounds normal. No respiratory distress. She has no wheezes. She has no rales. She exhibits no tenderness.  Abdominal: Soft. Bowel sounds are normal. She exhibits no distension and no mass. There is no tenderness. There is no rebound and no guarding.  Musculoskeletal: Normal range of motion.  Lymphadenopathy:    She has no cervical adenopathy.  Neurological: She is alert and oriented to person, place, and time. No cranial nerve deficit. Coordination normal.  Skin: Skin is warm and dry. She is not diaphoretic.  Left wrist with 4x3cm ulcer with  white exudate, pink healthy looking tissue surrounding it without erythema, swelling, warmth. Good distal neurovascular exam.  Psychiatric: She has a normal mood and affect. Her behavior is normal. Judgment and thought content normal.  Nursing note and vitals reviewed.      Assessment & Plan:  2nd-3rd degree burn, healing-  Will send in silver sulfadene cream, non stick bandage, continue washing with soap and water.   Depression- start on low dose of celexa.

## 2014-10-27 NOTE — Patient Instructions (Signed)
Burn Care Your skin is a natural barrier to infection. It is the largest organ of your body. Burns damage this natural protection. To help prevent infection, it is very important to follow your caregiver's instructions in the care of your burn. Burns are classified as:  First degree. There is only redness of the skin (erythema). No scarring is expected.  Second degree. There is blistering of the skin. Scarring may occur with deeper burns.  Third degree. All layers of the skin are injured, and scarring is expected. HOME CARE INSTRUCTIONS   Wash your hands well before changing your bandage.  Change your bandage as often as directed by your caregiver.  Remove the old bandage. If the bandage sticks, you may soak it off with cool, clean water.  Cleanse the burn thoroughly but gently with mild soap and water.  Pat the area dry with a clean, dry cloth.  Apply a thin layer of antibacterial cream to the burn.  Apply a clean bandage as instructed by your caregiver.  Keep the bandage as clean and dry as possible.  Elevate the affected area for the first 24 hours, then as instructed by your caregiver.  Only take over-the-counter or prescription medicines for pain, discomfort, or fever as directed by your caregiver. SEEK IMMEDIATE MEDICAL CARE IF:   You develop excessive pain.  You develop redness, tenderness, swelling, or red streaks near the burn.  The burned area develops yellowish-white fluid (pus) or a bad smell.  You have a fever. MAKE SURE YOU:   Understand these instructions.  Will watch your condition.  Will get help right away if you are not doing well or get worse. Document Released: 06/17/2005 Document Revised: 09/09/2011 Document Reviewed: 11/07/2010 Exit[employer] Patient Information 2015 [employer]. This information is not intended to replace advice given to you by your health care provider. Make sure you discuss any questions you have with your health care  provider.  

## 2014-11-09 ENCOUNTER — Emergency Department (HOSPITAL_COMMUNITY)
Admission: EM | Admit: 2014-11-09 | Discharge: 2014-11-09 | Disposition: A | Discharge status: Home / Self Care | Payer: Medicare Other | Attending: Emergency Medicine | Admitting: Emergency Medicine

## 2014-11-09 ENCOUNTER — Encounter (HOSPITAL_COMMUNITY): Payer: Self-pay | Admitting: Emergency Medicine

## 2014-11-09 ENCOUNTER — Emergency Department (HOSPITAL_COMMUNITY): Payer: Medicare Other

## 2014-11-09 DIAGNOSIS — R1084 Generalized abdominal pain: Secondary | ICD-10-CM | POA: Diagnosis not present

## 2014-11-09 DIAGNOSIS — Z7982 Long term (current) use of aspirin: Secondary | ICD-10-CM | POA: Insufficient documentation

## 2014-11-09 DIAGNOSIS — Z79899 Other long term (current) drug therapy: Secondary | ICD-10-CM | POA: Insufficient documentation

## 2014-11-09 DIAGNOSIS — Z792 Long term (current) use of antibiotics: Secondary | ICD-10-CM | POA: Diagnosis not present

## 2014-11-09 DIAGNOSIS — R110 Nausea: Secondary | ICD-10-CM | POA: Diagnosis not present

## 2014-11-09 DIAGNOSIS — R61 Generalized hyperhidrosis: Secondary | ICD-10-CM | POA: Diagnosis not present

## 2014-11-09 DIAGNOSIS — Z90710 Acquired absence of both cervix and uterus: Secondary | ICD-10-CM | POA: Insufficient documentation

## 2014-11-09 DIAGNOSIS — T451X5A Adverse effect of antineoplastic and immunosuppressive drugs, initial encounter: Secondary | ICD-10-CM | POA: Diagnosis not present

## 2014-11-09 DIAGNOSIS — C7951 Secondary malignant neoplasm of bone: Secondary | ICD-10-CM | POA: Diagnosis not present

## 2014-11-09 DIAGNOSIS — C9000 Multiple myeloma not having achieved remission: Secondary | ICD-10-CM | POA: Diagnosis not present

## 2014-11-09 DIAGNOSIS — R197 Diarrhea, unspecified: Secondary | ICD-10-CM | POA: Diagnosis not present

## 2014-11-09 DIAGNOSIS — Z9089 Acquired absence of other organs: Secondary | ICD-10-CM | POA: Diagnosis not present

## 2014-11-09 DIAGNOSIS — Z8781 Personal history of (healed) traumatic fracture: Secondary | ICD-10-CM | POA: Insufficient documentation

## 2014-11-09 DIAGNOSIS — Z87891 Personal history of nicotine dependence: Secondary | ICD-10-CM | POA: Diagnosis not present

## 2014-11-09 DIAGNOSIS — K521 Toxic gastroenteritis and colitis: Secondary | ICD-10-CM | POA: Insufficient documentation

## 2014-11-09 DIAGNOSIS — K5900 Constipation, unspecified: Secondary | ICD-10-CM | POA: Diagnosis not present

## 2014-11-09 DIAGNOSIS — Z8673 Personal history of transient ischemic attack (TIA), and cerebral infarction without residual deficits: Secondary | ICD-10-CM | POA: Diagnosis not present

## 2014-11-09 DIAGNOSIS — Z8701 Personal history of pneumonia (recurrent): Secondary | ICD-10-CM | POA: Insufficient documentation

## 2014-11-09 DIAGNOSIS — T65891A Toxic effect of other specified substances, accidental (unintentional), initial encounter: Secondary | ICD-10-CM | POA: Insufficient documentation

## 2014-11-09 DIAGNOSIS — K219 Gastro-esophageal reflux disease without esophagitis: Secondary | ICD-10-CM | POA: Diagnosis not present

## 2014-11-09 DIAGNOSIS — E039 Hypothyroidism, unspecified: Secondary | ICD-10-CM | POA: Insufficient documentation

## 2014-11-09 DIAGNOSIS — G4700 Insomnia, unspecified: Secondary | ICD-10-CM | POA: Insufficient documentation

## 2014-11-09 LAB — CBC WITH DIFFERENTIAL/PLATELET
Basophils Absolute: 0 10*3/uL (ref 0.0–0.1)
Basophils Relative: 0 % (ref 0–1)
EOS PCT: 12 % — AB (ref 0–5)
Eosinophils Absolute: 0.2 10*3/uL (ref 0.0–0.7)
HCT: 38.3 % (ref 36.0–46.0)
Hemoglobin: 12.6 g/dL (ref 12.0–15.0)
LYMPHS PCT: 29 % (ref 12–46)
Lymphs Abs: 0.5 10*3/uL — ABNORMAL LOW (ref 0.7–4.0)
MCH: 32.1 pg (ref 26.0–34.0)
MCHC: 32.9 g/dL (ref 30.0–36.0)
MCV: 97.5 fL (ref 78.0–100.0)
Monocytes Absolute: 0.1 10*3/uL (ref 0.1–1.0)
Monocytes Relative: 6 % (ref 3–12)
NEUTROS PCT: 53 % (ref 43–77)
Neutro Abs: 0.8 10*3/uL — ABNORMAL LOW (ref 1.7–7.7)
PLATELETS: 217 10*3/uL (ref 150–400)
RBC: 3.93 MIL/uL (ref 3.87–5.11)
RDW: 15.5 % (ref 11.5–15.5)
WBC: 1.6 10*3/uL — AB (ref 4.0–10.5)

## 2014-11-09 LAB — LIPASE, BLOOD: Lipase: 15 U/L — ABNORMAL LOW (ref 22–51)

## 2014-11-09 LAB — COMPREHENSIVE METABOLIC PANEL
ALK PHOS: 51 U/L (ref 38–126)
ALT: 12 U/L — ABNORMAL LOW (ref 14–54)
AST: 14 U/L — ABNORMAL LOW (ref 15–41)
Albumin: 3.3 g/dL — ABNORMAL LOW (ref 3.5–5.0)
Anion gap: 6 (ref 5–15)
BUN: 11 mg/dL (ref 6–20)
CO2: 20 mmol/L — AB (ref 22–32)
CREATININE: 0.88 mg/dL (ref 0.44–1.00)
Calcium: 8.5 mg/dL — ABNORMAL LOW (ref 8.9–10.3)
Chloride: 108 mmol/L (ref 101–111)
GFR calc non Af Amer: >60 mL/min (ref >60)
Glucose, Bld: 93 mg/dL (ref 70–99)
Potassium: 3.4 mmol/L — ABNORMAL LOW (ref 3.5–5.1)
Sodium: 134 mmol/L — ABNORMAL LOW (ref 135–145)
Total Bilirubin: 1.2 mg/dL (ref 0.3–1.2)
Total Protein: 6.5 g/dL (ref 6.5–8.1)

## 2014-11-09 LAB — I-STAT CG4 LACTIC ACID, ED: Lactic Acid, Venous: 1.13 mmol/L (ref 0.5–2.0)

## 2014-11-09 MED ORDER — ONDANSETRON HCL 4 MG PO TABS: 4.0000 mg | ORAL_TABLET | Freq: Four times a day (QID) | ORAL | Status: DC

## 2014-11-09 MED ORDER — DIPHENOXYLATE-ATROPINE 2.5-0.025 MG PO TABS: 2.0000 | ORAL_TABLET | Freq: Four times a day (QID) | ORAL | Status: DC | PRN

## 2014-11-09 MED ORDER — DIPHENOXYLATE-ATROPINE 2.5-0.025 MG PO TABS
2.0000 | ORAL_TABLET | Freq: Once | ORAL | Status: AC
  Administered 2014-11-09: 2 via ORAL
  Filled 2014-11-09: qty 2

## 2014-11-09 MED ORDER — ONDANSETRON HCL 4 MG/2ML IJ SOLN
4.0000 mg | Freq: Once | INTRAMUSCULAR | Status: AC
  Administered 2014-11-09: 4 mg via INTRAVENOUS

## 2014-11-09 MED ORDER — SODIUM CHLORIDE 0.9 % IV BOLUS (SEPSIS)
500.0000 mL | Freq: Once | INTRAVENOUS | Status: AC
  Administered 2014-11-09: 500 mL via INTRAVENOUS

## 2014-11-09 NOTE — Discharge Instructions (Signed)

## 2014-11-09 NOTE — ED Notes (Signed)
Pt reminded of the need for urine.  

## 2014-11-09 NOTE — ED Notes (Signed)
Patient in bathroom.  I will collect labs when patient return.

## 2014-11-09 NOTE — ED Provider Notes (Signed)
CSN: [id number]     Arrival date & time 11/09/14  1102 History   First MD Initiated Contact with Patient 11/09/14 1126     Chief Complaint  Patient presents with  . Diarrhea     (Consider location/radiation/quality/duration/timing/severity/associated sxs/prior Treatment) HPI Comments: Patient presents to the emergency department for evaluation of nausea, abdominal pain, diarrhea. Patient reports that symptoms started 5 days ago. She reports that she initially thought that the diarrhea was secondary to her chemotherapy, because she has had symptoms of diarrhea in the past. She reports that usually it only lasts for a day or so, then resolves. Her diarrhea has worsened, everything she eats or drinks comes right through her. She has had progressively worsening constant abdominal pain. She has had sweats, but has not had a fever.  Patient is a 64 y.o. female presenting with diarrhea.  Diarrhea Associated symptoms: abdominal pain and diaphoresis   Associated symptoms: no vomiting     Past Medical History  Diagnosis Date  . Hypercholesterolemia   . Compression fracture 04/08/2007    pathologic compression fracture  . Hypothyroidism   . FHx: chemotherapy     s/p 5 cycle revlimid/low dose decadron,s/p velcade,doxil,decadron,  . Hx of radiation therapy 05/05/07-05/18/07,& 03/05/11-03/21/11-    l3&l5 in 2008, t2-t6 03/2011  . GERD (gastroesophageal reflux disease)   . Insomnia     associated with steroids  . Constipation     takes oxycontin,vicodin  . Hx of radiation therapy 05/05/2007 to 05/18/2007    palliative, L3-5  . Hx of radiation therapy 03/05/2011 to 03/21/2011    palliative T2-T6, c-spine  . 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 PONV (postoperative nausea and vomiting)   . Multiple myeloma dx'd 2009  . Metastasis to bone   . Family history of anesthesia complication     "daughter gets PONV too"  . Pneumonia     "several times"  . Stroke 2014     denies residual on 01/27/2014   Past Surgical History  Procedure Laterality Date  . Knee arthroscopy Right     "put pin in"  . Knee arthroscopy Right     "took pin out and corrected what was wrong"  . Video bronchoscopy  07/30/2011    Procedure: VIDEO BRONCHOSCOPY WITHOUT FLUORO;  Surgeon: Kathee Delton, MD;  Location: [facility];  Service: Cardiopulmonary;  Laterality: Bilateral;  . Cholecystectomy  1980's  . Portacath placement Right 2009  . Vaginal hysterectomy  1980's   Family History  Problem Relation Age of Onset  . Lung cancer Brother   . Colon cancer Maternal Uncle   . Breast cancer Maternal Grandmother    History  Substance Use Topics  . Smoking status: Former Smoker -- 0.25 packs/day for 6 years    Types: Cigarettes    Quit date: 08/27/1978  . Smokeless tobacco: Never Used  . Alcohol Use: No     Comment: "quit drinking in the 1980's", previously drank on the weekend   OB History    No data available     Review of Systems  Constitutional: Positive for diaphoresis.  Gastrointestinal: Positive for nausea, abdominal pain and diarrhea. Negative for vomiting.  All other systems reviewed and are negative.     Allergies  Review of patient's allergies indicates no known allergies.  Home Medications   Prior to Admission medications   Medication Sig Start Date End Date Taking? Authorizing Provider  ALPRAZolam Duanne Moron) 0.5 MG tablet  TAKE 1 TABLET BY MOUTH AS NEEDED FOR ANXIETY 05/07/14  Yes Vicie Mutters, [name]  aspirin 81 MG tablet Take 81 mg by mouth daily.   Yes Historical Provider, MD  baclofen (LIORESAL) 10 MG tablet Take 1 tablet (10 mg total) by mouth 2 (two) times daily. Patient taking differently: Take 10 mg by mouth 2 (two) times daily as needed for muscle spasms.  08/02/14  Yes Donika K Patel, DO  citalopram (CELEXA) 20 MG tablet Take 1 tablet (20 mg total) by mouth daily. 10/27/14  Yes Vicie Mutters, [name]  cyclobenzaprine (FLEXERIL) 10 MG tablet Take  1/2-1 tablet TID PRN for muscle spasms Patient taking differently: Take 5-10 mg by mouth 3 (three) times daily as needed.  06/28/14  Yes Jennifer Couillard, [name]  dexamethasone (DECADRON) 4 MG tablet TAKE 10 TABLETS BY MOUTH EVERY FRIDAY 10/07/14  Yes Curt Bears, MD  Eszopiclone 3 MG TABS Take 1 tablet (3 mg total) by mouth at bedtime as needed (sleep). Take immediately before bedtime 10/21/14  Yes Unk Pinto, MD  famotidine (PEPCID) 20 MG tablet Take 1 tablet (20 mg total) by mouth 2 (two) times daily. Patient taking differently: Take 20 mg by mouth at bedtime.  09/09/14 09/09/15 Yes Courtney Forcucci, [name]  fexofenadine (ALLEGRA) 60 MG tablet TAKE 1/2 TABLET BY MOUTH TWICE DAILY 09/07/14  Yes Vicie Mutters, [name]  furosemide (LASIX) 40 MG tablet TAKE 1 TABLET BY MOUTH TWICE A DAY FOR FLUID AND SWELLING 02/07/14  Yes Unk Pinto, MD  gabapentin (NEURONTIN) 600 MG tablet Take 600 mg by mouth 4 (four) times daily as needed. For pain or cramps   Yes Historical Provider, MD  HYDROcodone-acetaminophen (NORCO) 7.5-325 MG per tablet Take 1 tablet by mouth every 6 (six) hours as needed for moderate pain. 10/13/14  Yes Curt Bears, MD  hyoscyamine (LEVBID) 0.375 MG 12 hr tablet Take 1 tablet (0.375 mg total) by mouth 2 (two) times daily. 08/12/14  Yes Milus Banister, MD  hyoscyamine (LEVSIN SL) 0.125 MG SL tablet Place 1 tablet (0.125 mg total) under the tongue every 6 (six) hours as needed. Patient taking differently: Place 0.125 mg under the tongue every 6 (six) hours as needed for cramping.  04/11/14  Yes Willia Craze, NP  KLOR-CON M10 10 MEQ tablet TAKE 1 TABLET BY MOUTH TWICE A DAY 07/31/14  Yes Unk Pinto, MD  levothyroxine (SYNTHROID, LEVOTHROID) 100 MCG tablet Take 100 mcg by mouth daily before breakfast.    Yes Historical Provider, MD  morphine (MS CONTIN) 15 MG 12 hr tablet Take 1 tablet (15 mg total) by mouth every 12 (twelve) hours. 10/13/14  Yes Curt Bears, MD  Multiple  Vitamin (MULITIVITAMIN WITH MINERALS) TABS Take 1 tablet by mouth every morning.    Yes Historical Provider, MD  ondansetron (ZOFRAN-ODT) 8 MG disintegrating tablet TAKE 1 TABLET (8 MG TOTAL) BY MOUTH EVERY 8 (EIGHT) HOURS AS NEEDED FOR NAUSEA OR VOMITING. 09/05/14  Yes Unk Pinto, MD  pantoprazole (PROTONIX) 40 MG tablet TAKE 1 TABLET (40 MG TOTAL) BY MOUTH 2 (TWO) TIMES DAILY. 09/14/14  Yes Unk Pinto, MD  pomalidomide (POMALYST) 4 MG capsule Take 1 capsule (4 mg total) by mouth daily. Take with water on days 1-21. Repeat every 28 days. 10/03/14  Yes Curt Bears, MD  promethazine-dextromethorphan (PROMETHAZINE-DM) 6.25-15 MG/5ML syrup Take 1 teaspoon 4 x day ONLY if needed for severe cough 08/02/14  Yes Unk Pinto, MD  silver sulfADIAZINE (SILVADENE) 1 % cream Apply 1 application topically daily. 10/27/14  Yes Vicie Mutters, [name]  diphenoxylate-atropine (LOMOTIL) 2.5-0.025 MG per tablet Take 2 tablets by mouth 4 (four) times daily as needed for diarrhea or loose stools. 11/09/14   Orpah Greek, MD  ondansetron (ZOFRAN) 4 MG tablet Take 1 tablet (4 mg total) by mouth every 6 (six) hours. 11/09/14   Orpah Greek, MD   BP 115/58 mmHg  Pulse 77  Temp(Src) 98.3 F (36.8 C) (Oral)  Resp 19  SpO2 99% Physical Exam  Constitutional: She is oriented to person, place, and time. She appears well-developed and well-nourished. No distress.  HENT:  Head: Normocephalic and atraumatic.  Right Ear: Hearing normal.  Left Ear: Hearing normal.  Nose: Nose normal.  Mouth/Throat: Oropharynx is clear and moist and mucous membranes are normal.  Eyes: Conjunctivae and EOM are normal. Pupils are equal, round, and reactive to light.  Neck: Normal range of motion. Neck supple.  Cardiovascular: Regular rhythm, S1 normal and S2 normal.  Exam reveals no gallop and no friction rub.   No murmur heard. Pulmonary/Chest: Effort normal and breath sounds normal. No respiratory distress. She  exhibits no tenderness.  Abdominal: Soft. Normal appearance and bowel sounds are normal. There is no hepatosplenomegaly. There is no tenderness. There is no rebound, no guarding, no tenderness at McBurney's point and negative Murphy's sign. No hernia.  Musculoskeletal: Normal range of motion.  Neurological: She is alert and oriented to person, place, and time. She has normal strength. No cranial nerve deficit or sensory deficit. Coordination normal. GCS eye subscore is 4. GCS verbal subscore is 5. GCS motor subscore is 6.  Skin: Skin is warm, dry and intact. No rash noted. No cyanosis.  Psychiatric: She has a normal mood and affect. Her speech is normal and behavior is normal. Thought content normal.  Nursing note and vitals reviewed.   ED Course  Procedures (including critical care time) Labs Review Labs Reviewed  CBC WITH DIFFERENTIAL/PLATELET - Abnormal; Notable for the following:    WBC 1.6 (*)    Eosinophils Relative 12 (*)    Neutro Abs 0.8 (*)    Lymphs Abs 0.5 (*)    All other components within normal limits  COMPREHENSIVE METABOLIC PANEL - Abnormal; Notable for the following:    Sodium 134 (*)    Potassium 3.4 (*)    CO2 20 (*)    Calcium 8.5 (*)    Albumin 3.3 (*)    AST 14 (*)    ALT 12 (*)    All other components within normal limits  LIPASE, BLOOD - Abnormal; Notable for the following:    Lipase 15 (*)    All other components within normal limits  I-STAT CG4 LACTIC ACID, ED    Imaging Review Dg Abd Acute W/chest  11/09/2014   CLINICAL DATA:  Generalized abdominal pain with diarrhea for 1 week. Current history of multiple myeloma.  EXAM: DG ABDOMEN ACUTE W/ 1V CHEST  COMPARISON:  None.  FINDINGS: Status post cholecystectomy. Phleboliths are noted in the pelvis. No free air is noted. No definite small bowel dilatation is noted. Air-filled loops of large bowel are noted with air-fluid levels, but do not appear to be significantly dilated. Heart size and mediastinal  contours are within normal limits. Right internal jugular Port-A-Cath is noted with distal tip in expected position of SVC. Both lungs are clear.  IMPRESSION: No definite evidence of bowel obstruction or ileus. No acute cardiopulmonary abnormality seen.   Electronically Signed   By: Marijo Conception,  M.D.   On: 11/09/2014 13:35     EKG Interpretation   Date/Time:  Wednesday Nov 09 2014 12:05:45 EDT Ventricular Rate:  77 PR Interval:  138 QRS Duration: 90 QT Interval:  384 QTC Calculation: 435 R Axis:   47 Text Interpretation:  Sinus rhythm Baseline wander in lead(s) II III aVL  aVF V4 No significant change since last tracing Confirmed by POLLINA  MD,  [place] [phone]) on 11/09/2014 12:14:15 PM      MDM   Final diagnoses:  Abdominal pain, generalized  Diarrhea    Patient presented to the ER for evaluation of nausea without vomiting, abdominal pain and diarrhea. Patient has had similar reactions to her chemotherapy in the past, but symptoms were more severe today. She completed diffuse abdominal pain, but had a benign abdominal exam. No signs of peritonitis. Patient history IV fluids, antiemetics, analgesia with improvement. Repeat examination revealed that she has had several episodes of diarrhea, but abdominal exam remains benign. Lab work was unremarkable other than leukopenia which is chronic. Patient will be discharged for continued symptomatic treatment.  Orpah Greek, MD 11/10/14 [phone]

## 2014-11-09 NOTE — ED Notes (Signed)
Per pt, states she has had diarrhea and nausea for over a week-can't keep anything down-took last chemo pill today

## 2014-11-10 ENCOUNTER — Other Ambulatory Visit: Payer: Medicare Other

## 2014-11-10 ENCOUNTER — Encounter: Payer: Self-pay | Admitting: Physician Assistant

## 2014-11-10 ENCOUNTER — Other Ambulatory Visit: Payer: Self-pay | Admitting: *Deleted

## 2014-11-10 ENCOUNTER — Telehealth: Payer: Self-pay | Admitting: *Deleted

## 2014-11-10 ENCOUNTER — Other Ambulatory Visit (HOSPITAL_COMMUNITY)
Admission: RE | Admit: 2014-11-10 | Discharge: 2014-11-10 | Disposition: A | Discharge status: Home / Self Care | Payer: Medicare Other | Source: Ambulatory Visit | Attending: Internal Medicine | Admitting: Internal Medicine

## 2014-11-10 ENCOUNTER — Ambulatory Visit (HOSPITAL_BASED_OUTPATIENT_CLINIC_OR_DEPARTMENT_OTHER): Payer: Medicare Other

## 2014-11-10 ENCOUNTER — Ambulatory Visit (HOSPITAL_BASED_OUTPATIENT_CLINIC_OR_DEPARTMENT_OTHER): Payer: Medicare Other | Admitting: Physician Assistant

## 2014-11-10 VITALS — BP 91/69 | HR 82 | Temp 98.1°F | Resp 18 | Ht 64.0 in | Wt 194.3 lb

## 2014-11-10 VITALS — BP 105/72 | HR 80 | Resp 18

## 2014-11-10 DIAGNOSIS — M545 Low back pain: Secondary | ICD-10-CM | POA: Diagnosis not present

## 2014-11-10 DIAGNOSIS — R197 Diarrhea, unspecified: Secondary | ICD-10-CM

## 2014-11-10 DIAGNOSIS — C9000 Multiple myeloma not having achieved remission: Secondary | ICD-10-CM | POA: Diagnosis not present

## 2014-11-10 DIAGNOSIS — R110 Nausea: Secondary | ICD-10-CM

## 2014-11-10 DIAGNOSIS — M546 Pain in thoracic spine: Secondary | ICD-10-CM

## 2014-11-10 LAB — CLOSTRIDIUM DIFFICILE BY PCR: Toxigenic C. Difficile by PCR: NEGATIVE

## 2014-11-10 MED ORDER — SODIUM CHLORIDE 0.9 % IJ SOLN
10.0000 mL | INTRAMUSCULAR | Status: DC | PRN
  Administered 2014-11-10: 10 mL via INTRAVENOUS
  Filled 2014-11-10: qty 10

## 2014-11-10 MED ORDER — HEPARIN SOD (PORK) LOCK FLUSH 100 UNIT/ML IV SOLN
500.0000 [IU] | Freq: Once | INTRAVENOUS | Status: AC
  Administered 2014-11-10: 500 [IU] via INTRAVENOUS
  Filled 2014-11-10: qty 5

## 2014-11-10 MED ORDER — HYDROCODONE-ACETAMINOPHEN 7.5-325 MG PO TABS: 1.0000 | ORAL_TABLET | Freq: Four times a day (QID) | ORAL | Status: DC | PRN

## 2014-11-10 MED ORDER — SODIUM CHLORIDE 0.9 % IV SOLN
1000.0000 mL | Freq: Once | INTRAVENOUS | Status: AC
  Administered 2014-11-10: 1000 mL via INTRAVENOUS

## 2014-11-10 MED ORDER — MORPHINE SULFATE ER 15 MG PO TBCR: 15.0000 mg | EXTENDED_RELEASE_TABLET | Freq: Two times a day (BID) | ORAL | Status: DC

## 2014-11-10 MED ORDER — SODIUM CHLORIDE 0.9 % IV SOLN
Freq: Once | INTRAVENOUS | Status: DC
  Filled 2014-11-10: qty 4

## 2014-11-10 MED ORDER — POMALIDOMIDE 4 MG PO CAPS: 4.0000 mg | ORAL_CAPSULE | Freq: Every day | ORAL | Status: DC

## 2014-11-10 NOTE — Telephone Encounter (Signed)
Called and informed patient that prescription are ready for pick up.  Patient verbalized understanding.

## 2014-11-10 NOTE — Telephone Encounter (Signed)
"  Please call me at [phone].  I was there and forgot to ask for my prescription refills for Morphine and hydrocodone."

## 2014-11-10 NOTE — Patient Instructions (Signed)
Dehydration, Adult Dehydration is when you lose more fluids from the body than you take in. Vital organs like the kidneys, brain, and heart cannot function without a proper amount of fluids and salt. Any loss of fluids from the body can cause dehydration.  CAUSES   Vomiting.  Diarrhea.  Excessive sweating.  Excessive urine output.  Fever. SYMPTOMS  Mild dehydration  Thirst.  Dry lips.  Slightly dry mouth. Moderate dehydration  Very dry mouth.  Sunken eyes.  Skin does not bounce back quickly when lightly pinched and released.  Dark urine and decreased urine production.  Decreased tear production.  Headache. Severe dehydration  Very dry mouth.  Extreme thirst.  Rapid, weak pulse (more than 100 beats per minute at rest).  Cold hands and feet.  Not able to sweat in spite of heat and temperature.  Rapid breathing.  Blue lips.  Confusion and lethargy.  Difficulty being awakened.  Minimal urine production.  No tears. DIAGNOSIS  Your caregiver will diagnose dehydration based on your symptoms and your exam. Blood and urine tests will help confirm the diagnosis. The diagnostic evaluation should also identify the cause of dehydration. TREATMENT  Treatment of mild or moderate dehydration can often be done at home by increasing the amount of fluids that you drink. It is best to drink small amounts of fluid more often. Drinking too much at one time can make vomiting worse. Refer to the home care instructions below. Severe dehydration needs to be treated at the hospital where you will probably be given intravenous (IV) fluids that contain water and electrolytes. HOME CARE INSTRUCTIONS   Ask your caregiver about specific rehydration instructions.  Drink enough fluids to keep your urine clear or pale yellow.  Drink small amounts frequently if you have nausea and vomiting.  Eat as you normally do.  Avoid:  Foods or drinks high in sugar.  Carbonated  drinks.  Juice.  Extremely hot or cold fluids.  Drinks with caffeine.  Fatty, greasy foods.  Alcohol.  Tobacco.  Overeating.  Gelatin desserts.  Wash your hands well to avoid spreading bacteria and viruses.  Only take over-the-counter or prescription medicines for pain, discomfort, or fever as directed by your caregiver.  Ask your caregiver if you should continue all prescribed and over-the-counter medicines.  Keep all follow-up appointments with your caregiver. SEEK MEDICAL CARE IF:  You have abdominal pain and it increases or stays in one area (localizes).  You have a rash, stiff neck, or severe headache.  You are irritable, sleepy, or difficult to awaken.  You are weak, dizzy, or extremely thirsty. SEEK IMMEDIATE MEDICAL CARE IF:   You are unable to keep fluids down or you get worse despite treatment.  You have frequent episodes of vomiting or diarrhea.  You have blood or green matter (bile) in your vomit.  You have blood in your stool or your stool looks black and tarry.  You have not urinated in 6 to 8 hours, or you have only urinated a small amount of very dark urine.  You have a fever.  You faint. MAKE SURE YOU:   Understand these instructions.  Will watch your condition.  Will get help right away if you are not doing well or get worse. Document Released: 06/17/2005 Document Revised: 09/09/2011 Document Reviewed: 02/04/2011 [employer] Patient Information 2015 [employer]. This information is not intended to replace advice given to you by your health care provider. Make sure you discuss any questions you have with your health care   provider.  

## 2014-11-10 NOTE — Progress Notes (Signed)
[place] [phone]   Fax:([phone]) [place], [place] [address]  DIAGNOSIS: Recurrent multiple myeloma initially diagnosed in October 2008.   PRIOR THERAPY:  1. Status post palliative radiotherapy to the lumbar spine between L3 and L5. The patient received a total dose of 3000 cGy in 10 fractions under the care of Dr. Lisbeth Renshaw between May 05, 2007 through May 18, 2007. 2. Status post 5 cycles of systemic chemotherapy with Revlimid and low-dose Decadron with good response to this treatment. 3. Status post autologous peripheral blood stem cell transplant at [facility] on Nov 20, 2007 under the care of Dr. Valarie Merino. 4. Status post treatment for disease recurrence with Velcade, Doxil and Decadron. Last dose given Nov 09, 2009. Discontinued secondary to intolerance but the patient had a good response to treatment at that time. 5. Status post palliative radiotherapy to the T2-T6 thoracic vertebrae completed 03/21/2011 under the care of Dr. Lisbeth Renshaw. 6. Systemic chemotherapy with Velcade 1.3 mg per meter squared given on days 1, 4, 8 and 11, and Doxil at 30 mg per meter squared given on day 4 in addition to Decadron status post 4 cycles, discontinued secondary to intolerance. 7. Systemic therapy with Velcade 1.3 mg/M2 subcutaneously in addition to Decadron 40 mg by mouth on a weekly basis, status post 20 cycles. The patient had good response with this treatment but it is discontinue today secondary to worsening peripheral neuropathy. 8. Palliative radiotherapy to the skull lesion as well as the left hip area under the care of Dr. Lisbeth Renshaw. 9. Systemic chemotherapy with Carfilzomib 20 mg/M2 on days 1, 2,  8, 9, 13 and 16 every 4 weeks in addition to weekly Decadron 40 mg by mouth. First dose on 04/19/2013. Status post 4 cycles, discontinued recently secondary to cardiac  dysfunction.  CURRENT THERAPY:  1. Pomalyst 4 mg by mouth daily for 21 days every 4 weeks in addition to dexamethasone 40 mg on a weekly basis. First dose started 12/02/2013. Status post 11 cycles. She will start cycle #12 on 11/17/2014 2. Zometa 4 mg IV given every 3 months   INTERVAL HISTORY: [name] 64 y.o. female returns to the clinic today for follow up visit accompanied by her daughter.  The patient is not feeling well today. She reports having diarrhea for the past several days. She went to the emergency room last night and was given "fluids and something for diarrhea".  She reports that she normally has about 2 days of diarrhea/loose stools toward the end of her cycles of Pomalyst but her current diarrhea is different/more watery.She has some nausea but denied vomiting.She continues to have mid and lower back pain. She is tolerating her current treatment with Pomalyst fairly well with no significant adverse effects. She complains of weakness and fatigue. The patient denied having any significant chest pain, shortness of breath, or hemoptysis.  She has no weight loss or night sweats. She continues to have mild peripheral neuropathy and currently on gabapentin.   MEDICAL HISTORY: Past Medical History  Diagnosis Date  . Hypercholesterolemia   . Compression fracture 04/08/2007    pathologic compression fracture  . Hypothyroidism   . FHx: chemotherapy     s/p 5 cycle revlimid/low dose decadron,s/p velcade,doxil,decadron,  . Hx of radiation therapy 05/05/07-05/18/07,& 03/05/11-03/21/11-    l3&l5 in 2008, t2-t6 03/2011  . GERD (gastroesophageal reflux disease)   . Insomnia  associated with steroids  . Constipation     takes oxycontin,vicodin  . Hx of radiation therapy 05/05/2007 to 05/18/2007    palliative, L3-5  . Hx of radiation therapy 03/05/2011 to 03/21/2011    palliative T2-T6, c-spine  . 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 PONV  (postoperative nausea and vomiting)   . Multiple myeloma dx'd 2009  . Metastasis to bone   . Family history of anesthesia complication     "daughter gets PONV too"  . Pneumonia     "several times"  . Stroke 2014    denies residual on 01/27/2014    ALLERGIES:  has No Known Allergies.  MEDICATIONS:  Current Outpatient Prescriptions  Medication Sig Dispense Refill  . ALPRAZolam (XANAX) 0.5 MG tablet TAKE 1 TABLET BY MOUTH AS NEEDED FOR ANXIETY 30 tablet 3  . aspirin 81 MG tablet Take 81 mg by mouth daily.    . baclofen (LIORESAL) 10 MG tablet Take 1 tablet (10 mg total) by mouth 2 (two) times daily. (Patient taking differently: Take 10 mg by mouth 2 (two) times daily as needed for muscle spasms. ) 60 tablet 3  . citalopram (CELEXA) 20 MG tablet Take 1 tablet (20 mg total) by mouth daily. 30 tablet 0  . cyclobenzaprine (FLEXERIL) 10 MG tablet Take 1/2-1 tablet TID PRN for muscle spasms (Patient taking differently: Take 5-10 mg by mouth 3 (three) times daily as needed. ) 90 tablet 0  . dexamethasone (DECADRON) 4 MG tablet TAKE 10 TABLETS BY MOUTH EVERY FRIDAY 40 tablet 1  . diphenoxylate-atropine (LOMOTIL) 2.5-0.025 MG per tablet Take 2 tablets by mouth 4 (four) times daily as needed for diarrhea or loose stools. 30 tablet 0  . Eszopiclone 3 MG TABS Take 1 tablet (3 mg total) by mouth at bedtime as needed (sleep). Take immediately before bedtime 30 tablet 5  . famotidine (PEPCID) 20 MG tablet Take 1 tablet (20 mg total) by mouth 2 (two) times daily. (Patient taking differently: Take 20 mg by mouth at bedtime. ) 28 tablet 0  . fexofenadine (ALLEGRA) 60 MG tablet TAKE 1/2 TABLET BY MOUTH TWICE DAILY 15 tablet 1  . furosemide (LASIX) 40 MG tablet TAKE 1 TABLET BY MOUTH TWICE A DAY FOR FLUID AND SWELLING 60 tablet 10  . gabapentin (NEURONTIN) 600 MG tablet Take 600 mg by mouth 4 (four) times daily as needed. For pain or cramps    . HYDROcodone-acetaminophen (NORCO) 7.5-325 MG per tablet Take 1  tablet by mouth every 6 (six) hours as needed for moderate pain. 30 tablet 0  . hyoscyamine (LEVBID) 0.375 MG 12 hr tablet Take 1 tablet (0.375 mg total) by mouth 2 (two) times daily. 60 tablet 3  . hyoscyamine (LEVSIN SL) 0.125 MG SL tablet Place 1 tablet (0.125 mg total) under the tongue every 6 (six) hours as needed. (Patient taking differently: Place 0.125 mg under the tongue every 6 (six) hours as needed for cramping. ) 30 tablet 4  . KLOR-CON M10 10 MEQ tablet TAKE 1 TABLET BY MOUTH TWICE A DAY 60 tablet 3  . levothyroxine (SYNTHROID, LEVOTHROID) 100 MCG tablet Take 100 mcg by mouth daily before breakfast.     . morphine (MS CONTIN) 15 MG 12 hr tablet Take 1 tablet (15 mg total) by mouth every 12 (twelve) hours. 60 tablet 0  . Multiple Vitamin (MULITIVITAMIN WITH MINERALS) TABS Take 1 tablet by mouth every morning.     Marland Kitchen  ondansetron (ZOFRAN) 4 MG tablet Take 1 tablet (4 mg total) by mouth every 6 (six) hours. 12 tablet 0  . ondansetron (ZOFRAN-ODT) 8 MG disintegrating tablet TAKE 1 TABLET (8 MG TOTAL) BY MOUTH EVERY 8 (EIGHT) HOURS AS NEEDED FOR NAUSEA OR VOMITING. 30 tablet 1  . pantoprazole (PROTONIX) 40 MG tablet TAKE 1 TABLET (40 MG TOTAL) BY MOUTH 2 (TWO) TIMES DAILY. 60 tablet 5  . pomalidomide (POMALYST) 4 MG capsule Take 1 capsule (4 mg total) by mouth daily. Take with water on days 1-21. Repeat every 28 days. 21 capsule 0  . promethazine-dextromethorphan (PROMETHAZINE-DM) 6.25-15 MG/5ML syrup Take 1 teaspoon 4 x day ONLY if needed for severe cough 240 mL 0  . silver sulfADIAZINE (SILVADENE) 1 % cream Apply 1 application topically daily. 50 g 2   Current Facility-Administered Medications  Medication Dose Route Frequency Provider Last Rate Last Dose  . ondansetron (ZOFRAN) 8 mg in sodium chloride 0.9 % 50 mL IVPB   Intravenous Once Nelma Phagan E Kari Montero, [name]       Facility-Administered Medications Ordered in Other Visits  Medication Dose Route Frequency Provider Last Rate Last Dose  .  sodium chloride 0.9 % injection 10 mL  10 mL Intravenous PRN Carlton Adam, [name]   10 mL at 11/10/14 1322    SURGICAL HISTORY:  Past Surgical History  Procedure Laterality Date  . Knee arthroscopy Right     "put pin in"  . Knee arthroscopy Right     "took pin out and corrected what was wrong"  . Video bronchoscopy  07/30/2011    Procedure: VIDEO BRONCHOSCOPY WITHOUT FLUORO;  Surgeon: Kathee Delton, MD;  Location: [facility];  Service: Cardiopulmonary;  Laterality: Bilateral;  . Cholecystectomy  1980's  . Portacath placement Right 2009  . Vaginal hysterectomy  1980's    REVIEW OF SYSTEMS:  Constitutional: negative Eyes: negative Ears, nose, mouth, throat, and face: negative Respiratory: negative Cardiovascular: negative Gastrointestinal: negative Genitourinary:negative Integument/breast: negative Hematologic/lymphatic: negative Musculoskeletal:positive for back pain Neurological: negative Behavioral/Psych: negative   PHYSICAL EXAMINATION: General appearance: alert, cooperative, fatigued and no distress Head: Normocephalic, without obvious abnormality, atraumatic Neck: no adenopathy, no JVD, supple, symmetrical, trachea midline and thyroid not enlarged, symmetric, no tenderness/mass/nodules Lymph nodes: Cervical, supraclavicular, and axillary nodes normal. Resp: clear to auscultation bilaterally Back: symmetric, no curvature. ROM normal. No CVA tenderness. Cardio: regular rate and rhythm, S1, S2 normal, no murmur, click, rub or gallop GI: soft, non-tender; bowel sounds normal; no masses,  no organomegaly Extremities: extremities normal, atraumatic, no cyanosis or edema Neurologic: Alert and oriented X 3, normal strength and tone. Normal symmetric reflexes. Normal coordination and gait  ECOG PERFORMANCE STATUS: 1 - Symptomatic but completely ambulatory  Blood pressure 91/69, pulse 82, temperature 98.1 F (36.7 C), temperature source Oral, resp. rate 18, height _0   (1.626 m), weight 194 lb 4.8 oz (88.134 kg), SpO2 100 %.  LABORATORY DATA: Lab Results  Component Value Date   WBC 1.6* 11/09/2014   HGB 12.6 11/09/2014   HCT 38.3 11/09/2014   MCV 97.5 11/09/2014   PLT 217 11/09/2014      Chemistry      Component Value Date/Time   NA 134* 11/09/2014 1158   NA 143 10/07/2014 1048   K 3.4* 11/09/2014 1158   K 3.6 10/07/2014 1048   CL 108 11/09/2014 1158   CL 105 12/17/2012 1015   CO2 20* 11/09/2014 1158   CO2 23 10/07/2014 1048   BUN 11 11/09/2014 1158  BUN 9.8 10/07/2014 1048   CREATININE 0.88 11/09/2014 1158   CREATININE 0.9 10/07/2014 1048   CREATININE 0.85 09/09/2014 1130      Component Value Date/Time   CALCIUM 8.5* 11/09/2014 1158   CALCIUM 8.5 10/07/2014 1048   ALKPHOS 51 11/09/2014 1158   ALKPHOS 79 10/07/2014 1048   AST 14* 11/09/2014 1158   AST 12 10/07/2014 1048   ALT 12* 11/09/2014 1158   ALT 12 10/07/2014 1048   BILITOT 1.2 11/09/2014 1158   BILITOT 1.01 10/07/2014 1048     Other lab results: Beta-2 microglobulin 2.46, free kappa light chain 3.15, free lambda light chain 8.01 and a kappa/lambda ratio of 0.39. IgG 1400, IgA 185 and IgM 22.  RADIOGRAPHIC STUDIES:  ASSESSMENT AND PLAN:  This is a very pleasant 64 years old [place] American female with recurrent multiple myeloma recently completed a course of treatment with Velcade and Decadron with improvement in her disease but this was discontinued secondary to peripheral neuropathy. The patient tolerated her treatment with Carfilzomib and Decadron fairly well except for the recent shortness breath and cough which was felt to be secondary to congestive heart failure from her treatment with Carfilzomib. This treatment was discontinued. She was started on treatment with Pomalyst and Decadron status post 11 cycles. She tolerated the last cycle of her treatment well. Her myeloma panel showed no significant evidence for disease progression. The patient was discussed with and  also seen by Dr. Julien Nordmann. She will be given 1 liter of normal saline IVF to offset the fluid loss from her diarrhea and Zofran 8 mg IV for nausea. She is to continue taking Imodium and lomotil. She is not to start her next cycle of Pomalyst until her diarrhea has resolved. A stool sample was obtained and was negative for C. Diff. For the persistent mid and lower back pain, I have the MRI of the thoracic and lumbar spine to rule out any disease progression or cord involvement. She would come back for followup visit in approximately 4 weeks for evaluation after repeating CBC, comprehensive metabolic panel and LDH. She will contact us when she starts her next cycle of Pomalyst and we will adjust her appointment accordingly.  The patient will continue her current treatment with Zometa every 3 months as scheduled. She will receive a dose of Zometa today. She was advised to call immediately if she has any concerning symptoms in the interval. The patient voices understanding of current disease status and treatment options and is in agreement with the current care plan.  All questions were answered. The patient knows to call the clinic with any problems, questions or concerns. We can certainly see the patient much sooner if necessary.  Carlton Adam, [name] 11/10/2014   Disclaimer: This note was dictated with voice recognition software. Similar sounding words can inadvertently be transcribed and may be missed upon review.

## 2014-11-10 NOTE — Telephone Encounter (Signed)
refill on Pomalyst '4mg'$  Q#[id number] faxed to [facility] Authorization# [id number]  [phone]

## 2014-11-12 ENCOUNTER — Other Ambulatory Visit: Payer: Self-pay | Admitting: Physician Assistant

## 2014-11-12 DIAGNOSIS — IMO0001 Reserved for inherently not codable concepts without codable children: Secondary | ICD-10-CM

## 2014-11-13 NOTE — Patient Instructions (Signed)
Take Imodium and Lomotil as prescribed Do not start your next cycle of Pomalyst  Until your diarrhea has resolved Follow up in approximately 4 weeks

## 2014-11-14 ENCOUNTER — Telehealth: Payer: Self-pay

## 2014-11-14 NOTE — Telephone Encounter (Signed)
Incoming fax, pomalyst to be delivered 11/11/14

## 2014-11-15 ENCOUNTER — Telehealth: Payer: Self-pay | Admitting: *Deleted

## 2014-11-15 NOTE — Telephone Encounter (Signed)
Patient called to inform MD that her diarrhea has stopped and she will resume her oral chemo on Thursday, 11/17/14.    Reviewed appts with patient for June. She would like a copy of her appts mailed to her.

## 2014-11-22 ENCOUNTER — Other Ambulatory Visit: Payer: Self-pay | Admitting: Internal Medicine

## 2014-11-22 ENCOUNTER — Ambulatory Visit
Admission: RE | Admit: 2014-11-22 | Discharge: 2014-11-22 | Disposition: A | Discharge status: Home / Self Care | Payer: Medicare Other | Source: Ambulatory Visit

## 2014-11-22 ENCOUNTER — Ambulatory Visit: Payer: Self-pay

## 2014-11-22 DIAGNOSIS — Z1231 Encounter for screening mammogram for malignant neoplasm of breast: Secondary | ICD-10-CM

## 2014-11-26 ENCOUNTER — Other Ambulatory Visit: Payer: Self-pay | Admitting: Physician Assistant

## 2014-11-26 DIAGNOSIS — J309 Allergic rhinitis, unspecified: Secondary | ICD-10-CM

## 2014-12-02 ENCOUNTER — Ambulatory Visit: Payer: Self-pay | Admitting: Internal Medicine

## 2014-12-05 ENCOUNTER — Other Ambulatory Visit: Payer: Self-pay | Admitting: Internal Medicine

## 2014-12-07 ENCOUNTER — Telehealth: Payer: Self-pay | Admitting: *Deleted

## 2014-12-07 NOTE — Telephone Encounter (Signed)
[facility] faxed Pomalyst refill request.  Request to provider's desk/in-basket for review.

## 2014-12-08 ENCOUNTER — Ambulatory Visit: Payer: Self-pay | Admitting: Neurology

## 2014-12-09 ENCOUNTER — Other Ambulatory Visit: Payer: Self-pay | Admitting: *Deleted

## 2014-12-09 ENCOUNTER — Other Ambulatory Visit: Payer: Self-pay | Admitting: Internal Medicine

## 2014-12-09 ENCOUNTER — Telehealth: Payer: Self-pay | Admitting: *Deleted

## 2014-12-09 DIAGNOSIS — C9000 Multiple myeloma not having achieved remission: Secondary | ICD-10-CM

## 2014-12-09 MED ORDER — POMALIDOMIDE 4 MG PO CAPS: 4.0000 mg | ORAL_CAPSULE | Freq: Every day | ORAL | Status: DC

## 2014-12-09 NOTE — Telephone Encounter (Signed)
[facility] faxed confirmation of facsimile receipt for Pomalyst prescription referral.  [facility] will verify insurance and make delivery arrangements with patient.

## 2014-12-09 NOTE — Telephone Encounter (Signed)
VM message received @ 9:21am regarding dexamethasone refill. Pt states she has only 8 tablets left and she is to take 10. Confirmed with [facility] that her prescription is ready for pick up. TC to pt and let her know that her prescription is ready.  She voiced understanding. No other issues identified.

## 2014-12-09 NOTE — Telephone Encounter (Signed)
Faxed Pomalyst '4mg'$  Rx Refill to [facility] [phone].  Survey completed. Authorization # [id number]

## 2014-12-12 ENCOUNTER — Encounter: Payer: Self-pay | Admitting: Internal Medicine

## 2014-12-12 ENCOUNTER — Ambulatory Visit (INDEPENDENT_AMBULATORY_CARE_PROVIDER_SITE_OTHER): Payer: Medicare Other | Admitting: Internal Medicine

## 2014-12-12 VITALS — BP 122/74 | HR 72 | Temp 97.8°F | Resp 16 | Ht 64.75 in | Wt 194.2 lb

## 2014-12-12 DIAGNOSIS — N183 Chronic kidney disease, stage 3 (moderate): Secondary | ICD-10-CM

## 2014-12-12 DIAGNOSIS — E1122 Type 2 diabetes mellitus with diabetic chronic kidney disease: Secondary | ICD-10-CM

## 2014-12-12 DIAGNOSIS — E782 Mixed hyperlipidemia: Secondary | ICD-10-CM | POA: Diagnosis not present

## 2014-12-12 DIAGNOSIS — E559 Vitamin D deficiency, unspecified: Secondary | ICD-10-CM

## 2014-12-12 DIAGNOSIS — E039 Hypothyroidism, unspecified: Secondary | ICD-10-CM | POA: Diagnosis not present

## 2014-12-12 DIAGNOSIS — I10 Essential (primary) hypertension: Secondary | ICD-10-CM

## 2014-12-12 DIAGNOSIS — Z79899 Other long term (current) drug therapy: Secondary | ICD-10-CM

## 2014-12-12 DIAGNOSIS — E1129 Type 2 diabetes mellitus with other diabetic kidney complication: Secondary | ICD-10-CM | POA: Diagnosis not present

## 2014-12-12 LAB — CBC WITH DIFFERENTIAL/PLATELET
BASOS ABS: 0 10*3/uL (ref 0.0–0.1)
BASOS PCT: 0 % (ref 0–1)
EOS ABS: 0.1 10*3/uL (ref 0.0–0.7)
EOS PCT: 4 % (ref 0–5)
HCT: 34.8 % — ABNORMAL LOW (ref 36.0–46.0)
Hemoglobin: 11.1 g/dL — ABNORMAL LOW (ref 12.0–15.0)
Lymphocytes Relative: 34 % (ref 12–46)
Lymphs Abs: 1.1 10*3/uL (ref 0.7–4.0)
MCH: 31.1 pg (ref 26.0–34.0)
MCHC: 31.9 g/dL (ref 30.0–36.0)
MCV: 97.5 fL (ref 78.0–100.0)
MONO ABS: 0.4 10*3/uL (ref 0.1–1.0)
MPV: 9.9 fL (ref 8.6–12.4)
Monocytes Relative: 14 % — ABNORMAL HIGH (ref 3–12)
Neutro Abs: 1.5 10*3/uL — ABNORMAL LOW (ref 1.7–7.7)
Neutrophils Relative %: 48 % (ref 43–77)
PLATELETS: 250 10*3/uL (ref 150–400)
RBC: 3.57 MIL/uL — ABNORMAL LOW (ref 3.87–5.11)
RDW: 16.5 % — AB (ref 11.5–15.5)
WBC: 3.1 10*3/uL — AB (ref 4.0–10.5)

## 2014-12-12 LAB — BASIC METABOLIC PANEL WITH GFR
BUN: 9 mg/dL (ref 6–23)
CHLORIDE: 107 meq/L (ref 96–112)
CO2: 24 mEq/L (ref 19–32)
CREATININE: 0.73 mg/dL (ref 0.50–1.10)
Calcium: 8.8 mg/dL (ref 8.4–10.5)
GFR, Est African American: >89 mL/min
GFR, Est Non African American: 88 mL/min
Glucose, Bld: 100 mg/dL — ABNORMAL HIGH (ref 70–99)
Potassium: 3.4 mEq/L — ABNORMAL LOW (ref 3.5–5.3)
Sodium: 142 mEq/L (ref 135–145)

## 2014-12-12 LAB — HEPATIC FUNCTION PANEL
ALK PHOS: 42 U/L (ref 39–117)
ALT: 11 U/L (ref 0–35)
AST: 9 U/L (ref 0–37)
Albumin: 3.4 g/dL — ABNORMAL LOW (ref 3.5–5.2)
BILIRUBIN DIRECT: 0.2 mg/dL (ref 0.0–0.3)
Indirect Bilirubin: 1 mg/dL (ref 0.2–1.2)
Total Bilirubin: 1.2 mg/dL (ref 0.2–1.2)
Total Protein: 5.9 g/dL — ABNORMAL LOW (ref 6.0–8.3)

## 2014-12-12 LAB — LIPID PANEL
CHOL/HDL RATIO: 3.3 ratio
Cholesterol: 179 mg/dL (ref 0–200)
HDL: 54 mg/dL (ref >=46)
LDL Cholesterol: 73 mg/dL (ref 0–99)
Triglycerides: 262 mg/dL — ABNORMAL HIGH (ref <150)
VLDL: 52 mg/dL — AB (ref 0–40)

## 2014-12-12 LAB — TSH: TSH: 0.52 u[IU]/mL (ref 0.350–4.500)

## 2014-12-12 LAB — HEMOGLOBIN A1C
HEMOGLOBIN A1C: 5.9 % — AB (ref <5.7)
Mean Plasma Glucose: 123 mg/dL — ABNORMAL HIGH (ref <117)

## 2014-12-12 LAB — MAGNESIUM: Magnesium: 1.7 mg/dL (ref 1.5–2.5)

## 2014-12-12 NOTE — Patient Instructions (Signed)

## 2014-12-12 NOTE — Progress Notes (Signed)
Patient ID: [name], female   DOB: [birth year], 64 y.o.   MRN: [id number]   This very nice 64 y.o. Sep BF presents for  follow up with Hypertension, Hyperlipidemia, Pre-Diabetes, Hypothyroidism and Vitamin D Deficiency. Patient has Multiple Myeloma dx'd in Oct 2008 and in 2009 received an autologous stem cell BMT. Patient has had recurrence and is stable on maintenance chemotherapy. She does have chronic severe disabling back pain due to her MM & is on chronic opiate therapy by Dr Julien Nordmann.    Patient is treated for labile HTN with a diuretic & BP has been controlled at home. Today's BP: 122/74 mmHg. Patient has had no complaints of any cardiac type chest pain, palpitations, dyspnea/orthopnea/PND, dizziness, claudication, or dependent edema.   Hyperlipidemia is controlled with diet. Patient denies myalgias or other med SE's. Last Lipids were at goal -  Chol 179; HDL 54; LDL 73; and elevated Trig 262 on 12/12/2014. Patient also has GERD which is controlled with Pantoprazole.    Also, the patient has history of morbid Obesity (BMI 32.55) and consequent T2_NIDDM which she is unsuccessfully attempting to control with diet and denies  any symptoms of reactive hypoglycemia, diabetic polys, paresthesias or visual blurring.  Last A1c was 6.1% on 09/09/2014.   Further, the patient also has history of Vitamin D Deficiency and supplements vitamin D without any suspected side-effects. Last vitamin D was 33 on 09/09/2014.  Medication Sig  . ALPRAZolam (XANAX) 0.5 MG tablet Take 1/2 to 1 tablet 2 to 3 x daily as needed for nerves  . aspirin 81 MG tablet Take 81 mg by mouth daily.  . citalopram (CELEXA) 20 MG tablet Take 1 tablet (20 mg total) by mouth daily.  . cyclobenzaprine (FLEXERIL) 10 MG tablet Take 1/2-1 tablet TID PRN for muscle spasms  . dexamethasone (DECADRON) 4 MG tablet TAKE 10 TABLETS BY MOUTH EVERY FRIDAY  . LOMOTIL 2.5-0.025 MG  Take 2 tablets by mouth 4 (four) times daily as needed for  diarrhea or loose stools.  . Eszopiclone 3 MG TABS Take 1 tablet (3 mg total) by mouth at bedtime as needed (sleep).   . fexofenadine  60 MG tablet Take 1 tablet daily for allergies  . furosemide  40 MG tablet TAKE 1 TABLET BY MOUTH TWICE A DAY FOR FLUID AND SWELLING  . gabapentin  600 MG tablet Take 600 mg by mouth 4 (four) times daily as needed. For pain or cramps  . NORCO 7.5-325 MG per tablet Take 1 tablet by mouth every 6 (six) hours as needed for moderate pain.  . hyoscyamine  SL 0.125 MG SL tablet Place 1 tablet (0.125 mg total) under the tongue every 6 (six) hours as needed.  Marland Kitchen levothyroxine  100 MCG tablet Take 100 mcg by mouth daily before breakfast.   . morphine (MS CONTIN) 15 MG 12 hr tablet Take 1 tablet (15 mg total) by mouth every 12 (twelve) hours.  [name] WITH MINERALS Take 1 tablet by mouth every morning.   . ondansetron -ODT 8 MG disintegrating tablet TAKE 1 TABLET (8 MG TOTAL) BY MOUTH EVERY 8 (EIGHT) HOURS AS NEEDED  . pantoprazole  40 MG tablet TAKE 1 TABLET (40 MG TOTAL) BY MOUTH 2 (TWO) TIMES DAILY.  Marland Kitchen pomalidomide (POMALYST) 4 MG capsule Take 1 capsule (4 mg total) by mouth daily. Take with water on days 1-21. Repeat every 28 days.  . hyoscyamine (LEVBID) 0.375 MG 12 hr tablet Take 1 tablet (0.375 mg total) by  mouth 2 (two) times daily.  Marland Kitchen KLOR-CON M10 10 MEQ tablet TAKE 1 TABLET BY MOUTH TWICE A DAY   No Known Allergies  PMHx:   Past Medical History  Diagnosis Date  . Hypercholesterolemia   . Compression fracture 04/08/2007    pathologic compression fracture  . Hypothyroidism   . FHx: chemotherapy     s/p 5 cycle revlimid/low dose decadron,s/p velcade,doxil,decadron,  . Hx of radiation therapy 05/05/07-05/18/07,& 03/05/11-03/21/11-    l3&l5 in 2008, t2-t6 03/2011  . GERD (gastroesophageal reflux disease)   . Insomnia     associated with steroids  . Constipation     takes oxycontin,vicodin  . Hx of radiation therapy 05/05/2007 to 05/18/2007     palliative, L3-5  . Hx of radiation therapy 03/05/2011 to 03/21/2011    palliative T2-T6, c-spine  . 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 PONV (postoperative nausea and vomiting)   . Multiple myeloma dx'd 2009  . Metastasis to bone   . Family history of anesthesia complication     "daughter gets PONV too"  . Pneumonia     "several times"  . Stroke 2014    denies residual on 01/27/2014   Immunization History  Administered Date(s) Administered  . Influenza,inj,Quad PF,36+ Mos 04/01/2014  . Influenza-Unspecified 03/31/2014  . Pneumococcal-Unspecified 07/01/2008  . Tdap 07/01/2008   Past Surgical History  Procedure Laterality Date  . Knee arthroscopy Right     "put pin in"  . Knee arthroscopy Right     "took pin out and corrected what was wrong"  . Video bronchoscopy  07/30/2011    Procedure: VIDEO BRONCHOSCOPY WITHOUT FLUORO;  Surgeon: Kathee Delton, MD;  Location: [facility];  Service: Cardiopulmonary;  Laterality: Bilateral;  . Cholecystectomy  1980's  . Portacath placement Right 2009  . Vaginal hysterectomy  1980's   FHx:    Reviewed / unchanged  SHx:    Reviewed / unchanged  Systems Review:  Constitutional: Denies fever, chills, wt changes, headaches, insomnia, fatigue, night sweats, change in appetite. Eyes: Denies redness, blurred vision, diplopia, discharge, itchy, watery eyes.  ENT: Denies discharge, congestion, post nasal drip, epistaxis, sore throat, earache, hearing loss, dental pain, tinnitus, vertigo, sinus pain, snoring.  CV: Denies chest pain, palpitations, irregular heartbeat, syncope, dyspnea, diaphoresis, orthopnea, PND, claudication or edema. Respiratory: denies cough, dyspnea, DOE, pleurisy, hoarseness, laryngitis, wheezing.  Gastrointestinal: Denies dysphagia, odynophagia, heartburn, reflux, water brash, abdominal pain or cramps, nausea, vomiting, bloating, diarrhea, constipation, hematemesis, melena, hematochezia  or  hemorrhoids. Genitourinary: Denies dysuria, frequency, urgency, nocturia, hesitancy, discharge, hematuria or flank pain. Musculoskeletal: Denies arthralgias, myalgias, stiffness, jt. swelling, pain, limping or strain/sprain.  Skin: Denies pruritus, rash, hives, warts, acne, eczema or change in skin lesion(s). Neuro: No weakness, tremor, incoordination, spasms, paresthesia or pain. Psychiatric: Denies confusion, memory loss or sensory loss. Endo: Denies change in weight, skin or hair change.  Heme/Lymph: No excessive bleeding, bruising or enlarged lymph nodes.  Physical Exam  BP 122/74   Pulse 72  Temp 97.8 F   Resp 16  Ht 5' 4.75"   Wt 194 lb     BMI 32.55  Appears well nourished and in no distress. Eyes: PERRLA, EOMs, conjunctiva no swelling or erythema. Sinuses: No frontal/maxillary tenderness ENT/Mouth: EAC's clear, TM's nl w/o erythema, bulging. Nares clear w/o erythema, swelling, exudates. Oropharynx clear without erythema or exudates. Oral hygiene is good. Tongue normal, non obstructing. Hearing intact.  Neck: Supple. Thyroid nl. Musician  2+/2+ without bruits, nodes or JVD. Chest: Respirations nl with BS clear & equal w/o rales, rhonchi, wheezing or stridor.  Cor: Heart sounds normal w/ regular rate and rhythm without sig. murmurs, gallops, clicks, or rubs. Peripheral pulses normal and equal  without edema.  Abdomen: Soft & bowel sounds normal. Non-tender w/o guarding, rebound, hernias, masses, or organomegaly.  Lymphatics: Unremarkable.  Musculoskeletal: Full ROM all peripheral extremities, joint stability, 5/5 strength, and normal gait.  Skin: Warm, dry without exposed rashes, lesions or ecchymosis apparent.  Neuro: Cranial nerves intact, reflexes equal bilaterally. Sensory-motor testing grossly intact. Tendon reflexes grossly intact.  Pysch: Alert & oriented x 3.  Insight and judgement nl & appropriate. No ideations.  Assessment and Plan:  1. Essential hypertension   2.  Hyperlipidemia  - Lipid panel  3. CKD stage 3 due to type 2 diabetes mellitus  - Hemoglobin A1c - Insulin, random  4. Hypothyroidism, unspecified hypothyroidism type  - TSH  5. Vitamin D deficiency  - Vit D  25 hydroxy (rtn osteoporosis monitoring)  6. Medication management  - CBC with Differential/Platelet - BASIC METABOLIC PANEL WITH GFR - Hepatic function panel - Magnesium   Recommended regular exercise, BP monitoring, weight control, and discussed med and SE's. Recommended labs to assess and monitor clinical status. Further disposition pending results of labs. Over 30 minutes of exam, counseling, chart review was performed

## 2014-12-13 ENCOUNTER — Other Ambulatory Visit: Payer: Self-pay | Admitting: Internal Medicine

## 2014-12-13 LAB — INSULIN, RANDOM: Insulin: 96.5 u[IU]/mL — ABNORMAL HIGH (ref 2.0–19.6)

## 2014-12-13 LAB — VITAMIN D 25 HYDROXY (VIT D DEFICIENCY, FRACTURES): Vit D, 25-Hydroxy: 31 ng/mL (ref 30–100)

## 2014-12-14 ENCOUNTER — Ambulatory Visit: Payer: Self-pay | Admitting: Neurology

## 2014-12-14 ENCOUNTER — Other Ambulatory Visit: Payer: Self-pay | Admitting: Internal Medicine

## 2014-12-14 NOTE — Telephone Encounter (Signed)
[facility] sent facsimile confirmation of Pomalyst prescription shipment.  Pomalyst was shipped on 12-13-2014 with next business day delivery.

## 2014-12-15 ENCOUNTER — Encounter: Payer: Self-pay | Admitting: Internal Medicine

## 2014-12-15 ENCOUNTER — Ambulatory Visit (HOSPITAL_BASED_OUTPATIENT_CLINIC_OR_DEPARTMENT_OTHER): Payer: Medicare Other

## 2014-12-15 ENCOUNTER — Ambulatory Visit: Payer: Self-pay

## 2014-12-15 ENCOUNTER — Telehealth: Payer: Self-pay | Admitting: Internal Medicine

## 2014-12-15 ENCOUNTER — Ambulatory Visit (HOSPITAL_COMMUNITY)
Admission: RE | Admit: 2014-12-15 | Discharge: 2014-12-15 | Disposition: A | Discharge status: Home / Self Care | Payer: Medicare Other | Source: Ambulatory Visit | Attending: Internal Medicine | Admitting: Internal Medicine

## 2014-12-15 ENCOUNTER — Other Ambulatory Visit: Payer: Self-pay | Admitting: *Deleted

## 2014-12-15 ENCOUNTER — Other Ambulatory Visit (HOSPITAL_BASED_OUTPATIENT_CLINIC_OR_DEPARTMENT_OTHER): Payer: Medicare Other

## 2014-12-15 ENCOUNTER — Ambulatory Visit (HOSPITAL_BASED_OUTPATIENT_CLINIC_OR_DEPARTMENT_OTHER): Payer: Medicare Other | Admitting: Internal Medicine

## 2014-12-15 VITALS — BP 113/61 | HR 68 | Temp 98.7°F | Resp 18 | Ht 64.75 in | Wt 196.0 lb

## 2014-12-15 DIAGNOSIS — C9002 Multiple myeloma in relapse: Secondary | ICD-10-CM

## 2014-12-15 DIAGNOSIS — C9000 Multiple myeloma not having achieved remission: Secondary | ICD-10-CM

## 2014-12-15 DIAGNOSIS — M79605 Pain in left leg: Secondary | ICD-10-CM | POA: Insufficient documentation

## 2014-12-15 DIAGNOSIS — R600 Localized edema: Secondary | ICD-10-CM | POA: Diagnosis not present

## 2014-12-15 DIAGNOSIS — I509 Heart failure, unspecified: Secondary | ICD-10-CM | POA: Diagnosis not present

## 2014-12-15 DIAGNOSIS — M79662 Pain in left lower leg: Secondary | ICD-10-CM | POA: Diagnosis not present

## 2014-12-15 DIAGNOSIS — Z452 Encounter for adjustment and management of vascular access device: Secondary | ICD-10-CM

## 2014-12-15 DIAGNOSIS — M7989 Other specified soft tissue disorders: Secondary | ICD-10-CM

## 2014-12-15 DIAGNOSIS — M79604 Pain in right leg: Secondary | ICD-10-CM | POA: Diagnosis not present

## 2014-12-15 LAB — COMPREHENSIVE METABOLIC PANEL (CC13)
ALT: 13 U/L (ref 0–55)
AST: 8 U/L (ref 5–34)
Albumin: 3.1 g/dL — ABNORMAL LOW (ref 3.5–5.0)
Alkaline Phosphatase: 64 U/L (ref 40–150)
Anion Gap: 6 mEq/L (ref 3–11)
BUN: 9.8 mg/dL (ref 7.0–26.0)
CALCIUM: 8.8 mg/dL (ref 8.4–10.4)
CHLORIDE: 107 meq/L (ref 98–109)
CO2: 28 meq/L (ref 22–29)
CREATININE: 0.8 mg/dL (ref 0.6–1.1)
GLUCOSE: 90 mg/dL (ref 70–140)
Potassium: 3.5 mEq/L (ref 3.5–5.1)
SODIUM: 141 meq/L (ref 136–145)
Total Bilirubin: 1.04 mg/dL (ref 0.20–1.20)
Total Protein: 6 g/dL — ABNORMAL LOW (ref 6.4–8.3)

## 2014-12-15 MED ORDER — ZOLEDRONIC ACID 4 MG/100ML IV SOLN
4.0000 mg | Freq: Once | INTRAVENOUS | Status: AC
  Administered 2014-12-15: 4 mg via INTRAVENOUS
  Filled 2014-12-15: qty 100

## 2014-12-15 MED ORDER — MORPHINE SULFATE ER 15 MG PO TBCR: 15.0000 mg | EXTENDED_RELEASE_TABLET | Freq: Two times a day (BID) | ORAL | Status: DC

## 2014-12-15 MED ORDER — SODIUM CHLORIDE 0.9 % IJ SOLN
10.0000 mL | INTRAMUSCULAR | Status: DC | PRN
  Administered 2014-12-15: 10 mL
  Filled 2014-12-15: qty 10

## 2014-12-15 MED ORDER — HYDROCODONE-ACETAMINOPHEN 7.5-325 MG PO TABS: 1.0000 | ORAL_TABLET | Freq: Four times a day (QID) | ORAL | Status: DC | PRN

## 2014-12-15 MED ORDER — HEPARIN SOD (PORK) LOCK FLUSH 100 UNIT/ML IV SOLN
500.0000 [IU] | Freq: Once | INTRAVENOUS | Status: AC | PRN
  Administered 2014-12-15: 500 [IU]
  Filled 2014-12-15: qty 5

## 2014-12-15 NOTE — Telephone Encounter (Signed)
Patient called and left message.  She saw Dr. Julien Nordmann today and forgot to get scripts for morphine and hydrocodone.  Please call her when ready @ [phone].  She states she has to come back over here later today.

## 2014-12-15 NOTE — Telephone Encounter (Signed)
per pof to sch pt appt-sent Tia Masker email to pre-cert for Doppler-per Vaughan Basta NPR-Kim J sch pt Doppler

## 2014-12-15 NOTE — Progress Notes (Signed)
VASCULAR LAB PRELIMINARY  PRELIMINARY  PRELIMINARY  PRELIMINARY  Bilateral lower extremity venous duplex completed.    Preliminary report:  Bilateral:  No evidence of DVT, superficial thrombosis, or Baker's Cyst.   Kem Hensen, RVS 12/15/2014, 4:34 PM

## 2014-12-15 NOTE — Patient Instructions (Signed)

## 2014-12-15 NOTE — Telephone Encounter (Signed)
Patient en route for doppler study.  Would like refill on morphine and hydrocodone.  Okay per Dr. Julien Nordmann.  Informed her we will try so she asked what latest time to pick up prescriptions for the day.  Advised to come by 4:30 pm.

## 2014-12-15 NOTE — Progress Notes (Signed)
[place] Telephone:(336) [phone]   Fax:(336) [place], [place] [address]  DIAGNOSIS: Recurrent multiple myeloma initially diagnosed in October 2008.   PRIOR THERAPY:  1. Status post palliative radiotherapy to the lumbar spine between L3 and L5. The patient received a total dose of 3000 cGy in 10 fractions under the care of Dr. Lisbeth Renshaw between May 05, 2007 through May 18, 2007. 2. Status post 5 cycles of systemic chemotherapy with Revlimid and low-dose Decadron with good response to this treatment. 3. Status post autologous peripheral blood stem cell transplant at [facility] on Nov 20, 2007 under the care of Dr. Valarie Merino. 4. Status post treatment for disease recurrence with Velcade, Doxil and Decadron. Last dose given Nov 09, 2009. Discontinued secondary to intolerance but the patient had a good response to treatment at that time. 5. Status post palliative radiotherapy to the T2-T6 thoracic vertebrae completed 03/21/2011 under the care of Dr. Lisbeth Renshaw. 6. Systemic chemotherapy with Velcade 1.3 mg per meter squared given on days 1, 4, 8 and 11, and Doxil at 30 mg per meter squared given on day 4 in addition to Decadron status post 4 cycles, discontinued secondary to intolerance. 7. Systemic therapy with Velcade 1.3 mg/M2 subcutaneously in addition to Decadron 40 mg by mouth on a weekly basis, status post 20 cycles. The patient had good response with this treatment but it is discontinue today secondary to worsening peripheral neuropathy. 8. Palliative radiotherapy to the skull lesion as well as the left hip area under the care of Dr. Lisbeth Renshaw. 9. Systemic chemotherapy with Carfilzomib 20 mg/M2 on days 1, 2,  8, 9, 13 and 16 every 4 weeks in addition to weekly Decadron 40 mg by mouth. First dose on 04/19/2013. Status post 4 cycles, discontinued recently secondary to cardiac  dysfunction.  CURRENT THERAPY:  1. Pomalyst 4 mg by mouth daily for 21 days every 4 weeks in addition to dexamethasone 40 mg on a weekly basis. First dose started 12/02/2013. Status post 12 cycles. She will start cycle #13 on 12/15/2014 2. Zometa 4 mg IV given every 3 months   INTERVAL HISTORY: [name] 64 y.o. female returns to the clinic today for follow up visit accompanied by her daughter. The patient is feeling fine today with no specific complaints except for generalized aching pain as well as swelling and pain in the left lower extremity started few days ago. She is tolerating her current treatment with Pomalyst fairly well with no significant adverse effects. She denied having any significant fatigue or weakness. The patient denied having any significant chest pain, shortness of breath, or hemoptysis. She has no nausea or vomiting. She has no weight loss or night sweats. She continues to have mild peripheral neuropathy and currently on gabapentin.   MEDICAL HISTORY: Past Medical History  Diagnosis Date  . Hypercholesterolemia   . Compression fracture 04/08/2007    pathologic compression fracture  . Hypothyroidism   . FHx: chemotherapy     s/p 5 cycle revlimid/low dose decadron,s/p velcade,doxil,decadron,  . Hx of radiation therapy 05/05/07-05/18/07,& 03/05/11-03/21/11-    l3&l5 in 2008, t2-t6 03/2011  . GERD (gastroesophageal reflux disease)   . Insomnia     associated with steroids  . Constipation     takes oxycontin,vicodin  . Hx of radiation therapy 05/05/2007 to 05/18/2007    palliative, L3-5  . Hx of radiation therapy 03/05/2011 to 03/21/2011  palliative T2-T6, c-spine  . 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 PONV (postoperative nausea and vomiting)   . Multiple myeloma dx'd 2009  . Metastasis to bone   . Family history of anesthesia complication     "daughter gets PONV too"  . Pneumonia     "several times"  . Stroke 2014     denies residual on 01/27/2014    ALLERGIES:  has No Known Allergies.  MEDICATIONS:  Current Outpatient Prescriptions  Medication Sig Dispense Refill  . ALPRAZolam (XANAX) 0.5 MG tablet Take 1/2 to 1 tablet 2 to 3 x daily as needed for nerves 90 tablet 5  . aspirin 81 MG tablet Take 81 mg by mouth daily.    . cyclobenzaprine (FLEXERIL) 10 MG tablet Take 1/2-1 tablet TID PRN for muscle spasms (Patient taking differently: Take 5-10 mg by mouth 3 (three) times daily as needed. ) 90 tablet 0  . dexamethasone (DECADRON) 4 MG tablet TAKE 10 TABLETS BY MOUTH EVERY FRIDAY 40 tablet 1  . Eszopiclone 3 MG TABS Take 1 tablet (3 mg total) by mouth at bedtime as needed (sleep). Take immediately before bedtime 30 tablet 5  . furosemide (LASIX) 40 MG tablet TAKE 1 TABLET BY MOUTH TWICE A DAY FOR FLUID AND SWELLING 60 tablet 10  . gabapentin (NEURONTIN) 600 MG tablet Take 600 mg by mouth 4 (four) times daily as needed. For pain or cramps    . HYDROcodone-acetaminophen (NORCO) 7.5-325 MG per tablet Take 1 tablet by mouth every 6 (six) hours as needed for moderate pain. 30 tablet 0  . hyoscyamine (LEVSIN SL) 0.125 MG SL tablet Place 1 tablet (0.125 mg total) under the tongue every 6 (six) hours as needed. (Patient taking differently: Place 0.125 mg under the tongue every 6 (six) hours as needed for cramping. ) 30 tablet 4  . KLOR-CON 10 10 MEQ tablet Take 10 mEq by mouth 2 (two) times daily.  3  . levothyroxine (SYNTHROID, LEVOTHROID) 100 MCG tablet Take 100 mcg by mouth daily before breakfast.     . morphine (MS CONTIN) 15 MG 12 hr tablet Take 1 tablet (15 mg total) by mouth every 12 (twelve) hours. 60 tablet 0  . Multiple Vitamin (MULITIVITAMIN WITH MINERALS) TABS Take 1 tablet by mouth every morning.     . ondansetron (ZOFRAN-ODT) 8 MG disintegrating tablet TAKE 1 TABLET (8 MG TOTAL) BY MOUTH EVERY 8 (EIGHT) HOURS AS NEEDED FOR NAUSEA OR VOMITING. 30 tablet 1  . pantoprazole (PROTONIX) 40 MG tablet TAKE 1  TABLET (40 MG TOTAL) BY MOUTH 2 (TWO) TIMES DAILY. 60 tablet 5  . pomalidomide (POMALYST) 4 MG capsule Take 1 capsule (4 mg total) by mouth daily. Take with water on days 1-21. Repeat every 28 days. 21 capsule 0  . diphenoxylate-atropine (LOMOTIL) 2.5-0.025 MG per tablet Take 2 tablets by mouth 4 (four) times daily as needed for diarrhea or loose stools. (Patient not taking: Reported on 12/15/2014) 30 tablet 0  . fexofenadine (ALLEGRA) 60 MG tablet Take 1 tablet daily for allergies (Patient not taking: Reported on 12/15/2014) 90 tablet 1   No current facility-administered medications for this visit.    SURGICAL HISTORY:  Past Surgical History  Procedure Laterality Date  . Knee arthroscopy Right     "put pin in"  . Knee arthroscopy Right     "took pin out and corrected what was wrong"  . Video bronchoscopy  07/30/2011  Procedure: VIDEO BRONCHOSCOPY WITHOUT FLUORO;  Surgeon: Kathee Delton, MD;  Location: [facility];  Service: Cardiopulmonary;  Laterality: Bilateral;  . Cholecystectomy  1980's  . Portacath placement Right 2009  . Vaginal hysterectomy  1980's    REVIEW OF SYSTEMS:  Constitutional: negative Eyes: negative Ears, nose, mouth, throat, and face: negative Respiratory: negative Cardiovascular: negative Gastrointestinal: negative Genitourinary:negative Integument/breast: negative Hematologic/lymphatic: negative Musculoskeletal:positive for back pain Neurological: negative Behavioral/Psych: negative   PHYSICAL EXAMINATION: General appearance: alert, cooperative, fatigued and no distress Head: Normocephalic, without obvious abnormality, atraumatic Neck: no adenopathy, no JVD, supple, symmetrical, trachea midline and thyroid not enlarged, symmetric, no tenderness/mass/nodules Lymph nodes: Cervical, supraclavicular, and axillary nodes normal. Resp: clear to auscultation bilaterally Back: symmetric, no curvature. ROM normal. No CVA tenderness. Cardio: regular rate and  rhythm, S1, S2 normal, no murmur, click, rub or gallop GI: soft, non-tender; bowel sounds normal; no masses,  no organomegaly Extremities: edema 1+ edema of left lower extremity. Neurologic: Alert and oriented X 3, normal strength and tone. Normal symmetric reflexes. Normal coordination and gait  ECOG PERFORMANCE STATUS: 1 - Symptomatic but completely ambulatory  Blood pressure 113/61, pulse 68, temperature 98.7 F (37.1 C), temperature source Oral, resp. rate 18, height 5' 4.75" (1.645 m), weight 196 lb (88.905 kg), SpO2 97 %.  LABORATORY DATA: Lab Results  Component Value Date   WBC 3.1* 12/12/2014   HGB 11.1* 12/12/2014   HCT 34.8* 12/12/2014   MCV 97.5 12/12/2014   PLT 250 12/12/2014      Chemistry      Component Value Date/Time   NA 141 12/15/2014 1012   NA 142 12/12/2014 1158   K 3.5 12/15/2014 1012   K 3.4* 12/12/2014 1158   CL 107 12/12/2014 1158   CL 105 12/17/2012 1015   CO2 28 12/15/2014 1012   CO2 24 12/12/2014 1158   BUN 9.8 12/15/2014 1012   BUN 9 12/12/2014 1158   CREATININE 0.8 12/15/2014 1012   CREATININE 0.73 12/12/2014 1158   CREATININE 0.88 11/09/2014 1158      Component Value Date/Time   CALCIUM 8.8 12/15/2014 1012   CALCIUM 8.8 12/12/2014 1158   ALKPHOS 64 12/15/2014 1012   ALKPHOS 42 12/12/2014 1158   AST 8 12/15/2014 1012   AST 9 12/12/2014 1158   ALT 13 12/15/2014 1012   ALT 11 12/12/2014 1158   BILITOT 1.04 12/15/2014 1012   BILITOT 1.2 12/12/2014 1158     Other lab results: Beta-2 microglobulin 2.46, free kappa light chain 3.15, free lambda light chain 8.01 and a kappa/lambda ratio of 0.39. IgG 1400, IgA 185 and IgM 22.  RADIOGRAPHIC STUDIES:  ASSESSMENT AND PLAN:  This is a very pleasant 64 years old [place] American female with recurrent multiple myeloma recently completed a course of treatment with Velcade and Decadron with improvement in her disease but this was discontinued secondary to peripheral neuropathy. The patient tolerated  her treatment with Carfilzomib and Decadron fairly well except for the recent shortness breath and cough which was felt to be secondary to congestive heart failure from her treatment with Carfilzomib. This treatment was discontinued. She was started on treatment with Pomalyst and Decadron status post 12 cycles. She tolerated the last cycle of her treatment well. I recommended for the patient to proceed with cycle #13 as a scheduled. She will come back for follow-up visit in one month's for reevaluation after repeating myeloma panel. For the left lower extremity swelling and pain, I will arrange for the patient to  have US doppler of the lower extremity to rule out deep venous thrombosis She would come back for followup visit in 4 weeks for evaluation after repeating CBC, comprehensive metabolic panel and LDH. The patient will continue her current treatment with Zometa every 3 months as scheduled. She will receive a dose of Zometa today. She was advised to call immediately if she has any concerning symptoms in the interval. The patient voices understanding of current disease status and treatment options and is in agreement with the current care plan.  All questions were answered. The patient knows to call the clinic with any problems, questions or concerns. We can certainly see the patient much sooner if necessary.  Disclaimer: This note was dictated with voice recognition software. Similar sounding words can inadvertently be transcribed and may be missed upon review.

## 2014-12-15 NOTE — Progress Notes (Signed)
Prescriptions locked in injection room

## 2014-12-15 NOTE — Telephone Encounter (Signed)
S/w Butch Penny at [facility] to set up doppler, set up for today and pt aware, gave pt AVS and Calendar... KJ

## 2014-12-16 ENCOUNTER — Encounter (HOSPITAL_COMMUNITY): Payer: Self-pay

## 2014-12-22 ENCOUNTER — Encounter: Payer: Self-pay | Admitting: Neurology

## 2014-12-22 ENCOUNTER — Ambulatory Visit (INDEPENDENT_AMBULATORY_CARE_PROVIDER_SITE_OTHER): Payer: Medicare Other | Admitting: Neurology

## 2014-12-22 VITALS — BP 100/60 | HR 72 | Wt 199.1 lb

## 2014-12-22 DIAGNOSIS — M542 Cervicalgia: Secondary | ICD-10-CM

## 2014-12-22 DIAGNOSIS — G622 Polyneuropathy due to other toxic agents: Secondary | ICD-10-CM

## 2014-12-22 DIAGNOSIS — M4802 Spinal stenosis, cervical region: Secondary | ICD-10-CM | POA: Diagnosis not present

## 2014-12-22 DIAGNOSIS — T451X5A Adverse effect of antineoplastic and immunosuppressive drugs, initial encounter: Secondary | ICD-10-CM

## 2014-12-22 DIAGNOSIS — G620 Drug-induced polyneuropathy: Secondary | ICD-10-CM

## 2014-12-22 NOTE — Progress Notes (Signed)
Follow-up Visit   Date: 12/22/2014    [name] MRN: [id number] DOB: Aug 19, 1950   Interim History: [name] is a 64 y.o. right-handed African American female with history of recurrent multiple myeloma (diagnosed 2008) undergoing chemotherapy and radiation, pre-diabetes, hyperlipidemia, hypothyroidism, GERD, left frontal stroke (2014, no residual symptoms) and neuropathy returning to the clinic for follow-up of cervicalgia and neuropathy.  The patient was accompanied to the clinic by daughter and grand-daughter who also provides collateral information.    History of present illness: She reports having left sided neck pain since summer 2014, which was intermittent but worsened in July 2015. It is improved if she supports her neck and worse with neck pain. Pain is described as squeezing pain. She has associated tingling of the arm. On January 27, 2014, she presented to ED with paresthesias of the left arm, axilla, neck, and tongue. She has chronic paresthesias of the hands and feet from neuropathy, but these symptoms were different such that it involved greater area. She also had weakness of the right leg which lasted 25-30 minutes, but it slowly improved. MRI/A brain did not show any acute stroke, but stable appearance of large boney lesion involving the right parietal skull with extension to the underlying dura measuring 3.9 x 1.1 cm. Small lesions in the left parietal bone are also unchanged. Lesion in the C2 vertebral body is also grossly unchanged. Imaging of the cervical spine shows multilevel foraminal narrowing, worse at C5-6 and C6-7 on the left. She was recommended to follow-up with neurology.  She has been having ongoing problem with numbness/tingling of the hands and feet, worse on the left since starting chemotherapy in 2011 with Velcade. Her chemotherapy was stopped, but there was no improvement of paresthesias. She is taking neurontin 670m four times daily  which helps alleviate the pain. Heat and epson salt soaks also helps her discomfort. Paresthesias mostly involve her lower legs. She has not been falling, but does report to dropping things.   She initially was diagnosed in November 2008 for which she completed chemotherapy with Revlimid and [facility] In [facility], she was treated with Velcade, Doxil, and decadron with good response, but Velcade was discontinued due to worsening peripheral neuropathy. In 2014, she was started on chemotherapy with Carfilzomib but this was stopped due to cardiac dysfunction. Most recently in June 2015, Pomalyst and Zometa was started.   She takes a number of pain medications including MS contin 132mBID and Norco 7.81m63mhroughout the day as needed.   UPDATE 08/09/2014:  Since starting baclofen 1m92ms and gabapentin 600mg43m, she did notice some improvement, such that the intensity is less and cramps are improved, but overall, symptoms still persist.  She is most bothered by her ongoing left sided neck pain and when severe takes flexeril which allows her to at least rest.   UPDATE 12/22/2014:  She did not complete physical therapy because it made the pain worse. She feels that everything is worse on her left side and notices certain neck positions can trigger and alleviate the pain. She takes a number of pain medications including MS contin 181mg 58m Norco 7.81mg th72mghout the day as needed, and gabapentin 600mg fo581mimes daily. She takes the baclofen as needed and stopped flexeril because of sedation.  She denies any weakness and endorses gait unsteadiness, but no recent falls.     Medications:  Current Outpatient Prescriptions on File Prior to Visit  Medication  Sig Dispense Refill  . ALPRAZolam (XANAX) 0.5 MG tablet Take 1/2 to 1 tablet 2 to 3 x daily as needed for nerves 90 tablet 5  . aspirin 81 MG tablet Take 81 mg by mouth daily.    . cyclobenzaprine (FLEXERIL) 10 MG tablet  Take 1/2-1 tablet TID PRN for muscle spasms (Patient taking differently: Take 5-10 mg by mouth 3 (three) times daily as needed. ) 90 tablet 0  . dexamethasone (DECADRON) 4 MG tablet TAKE 10 TABLETS BY MOUTH EVERY FRIDAY 40 tablet 1  . diphenoxylate-atropine (LOMOTIL) 2.5-0.025 MG per tablet Take 2 tablets by mouth 4 (four) times daily as needed for diarrhea or loose stools. 30 tablet 0  . Eszopiclone 3 MG TABS Take 1 tablet (3 mg total) by mouth at bedtime as needed (sleep). Take immediately before bedtime 30 tablet 5  . fexofenadine (ALLEGRA) 60 MG tablet Take 1 tablet daily for allergies 90 tablet 1  . furosemide (LASIX) 40 MG tablet TAKE 1 TABLET BY MOUTH TWICE A DAY FOR FLUID AND SWELLING 60 tablet 10  . gabapentin (NEURONTIN) 600 MG tablet Take 600 mg by mouth 4 (four) times daily as needed. For pain or cramps    . HYDROcodone-acetaminophen (NORCO) 7.5-325 MG per tablet Take 1 tablet by mouth every 6 (six) hours as needed for moderate pain. 30 tablet 0  . hyoscyamine (LEVSIN SL) 0.125 MG SL tablet Place 1 tablet (0.125 mg total) under the tongue every 6 (six) hours as needed. (Patient taking differently: Place 0.125 mg under the tongue every 6 (six) hours as needed for cramping. ) 30 tablet 4  . KLOR-CON 10 10 MEQ tablet Take 10 mEq by mouth 2 (two) times daily.  3  . levothyroxine (SYNTHROID, LEVOTHROID) 100 MCG tablet Take 100 mcg by mouth daily before breakfast.     . morphine (MS CONTIN) 15 MG 12 hr tablet Take 1 tablet (15 mg total) by mouth every 12 (twelve) hours. 60 tablet 0  . Multiple Vitamin (MULITIVITAMIN WITH MINERALS) TABS Take 1 tablet by mouth every morning.     . ondansetron (ZOFRAN-ODT) 8 MG disintegrating tablet TAKE 1 TABLET (8 MG TOTAL) BY MOUTH EVERY 8 (EIGHT) HOURS AS NEEDED FOR NAUSEA OR VOMITING. 30 tablet 1  . pantoprazole (PROTONIX) 40 MG tablet TAKE 1 TABLET (40 MG TOTAL) BY MOUTH 2 (TWO) TIMES DAILY. 60 tablet 5  . pomalidomide (POMALYST) 4 MG capsule Take 1 capsule  (4 mg total) by mouth daily. Take with water on days 1-21. Repeat every 28 days. 21 capsule 0   No current facility-administered medications on file prior to visit.    Allergies: No Known Allergies  Review of Systems:  CONSTITUTIONAL: No fevers, chills, night sweats, or weight loss.  EYES: No visual changes or eye pain ENT: No hearing changes.  No history of nose bleeds.   RESPIRATORY: No cough, wheezing and shortness of breath.   CARDIOVASCULAR: Negative for chest pain, and palpitations.   GI: Negative for abdominal discomfort, blood in stools or black stools.  No recent change in bowel habits.   GU:  No history of incontinence.   MUSCLOSKELETAL: +history of joint pain or swelling.  +myalgias.   SKIN: Negative for lesions, rash, and itching.   ENDOCRINE: Negative for cold or heat intolerance, polydipsia or goiter.   PSYCH:  No depression or anxiety symptoms.   NEURO: As Above.   Vital Signs:  BP 100/60 mmHg  Pulse 72  Wt 199 lb 2 oz (90.323 kg)  SpO2 97%   Neurological Exam: MENTAL STATUS including orientation to time, place, person, recent and remote memory, attention span and concentration, language, and fund of knowledge is normal.  Speech is not dysarthric.  CRANIAL NERVES:  Pupils equal round and reactive to light.  Normal conjugate, extra-ocular eye movements in all directions of gaze.  No ptosis. Face is symmetric. Palate elevates symmetrically.  Tongue is midline.  MOTOR:  Motor strength is 5/5 in all extremities  No pronator drift.  Tone is normal.    MSRs:  Right                                                                 Left brachioradialis 2+  brachioradialis 2+  biceps 2+  biceps 2+  triceps 2+  triceps 2+  patellar 1+  Patellar 1+  ankle jerk 0  ankle jerk 0  Hoffman no  Hoffman no  plantar response down  plantar response down   SENSATION: vibration diminished distal to knees bilaterally.  COORDINATION/GAIT:  Gait narrow based and  stable.  Data: MRI/A brain 01/26/2014: 1. No evidence of acute intracranial abnormality. Mild chronic white matter changes.  2. Unchanged skull lesions, consistent with multiple myeloma, including a large right parietal lesion with involvement of the underlying dura.  3. No evidence of major intracranial arterial occlusion or proximal stenosis.  MRI cervical spine 03/21/2014: 1. Left central and foraminal disease at C5-6 and C6-7 results in moderate left lateral recess and foraminal stenosis at both levels. There may be some progression at C6-7. There is no significant change at C5-6.  2. Mild right foraminal narrowing at C5-6 and C6-7.  3. Stable mild left foraminal narrowing at C4-5.  4. Mild facet hypertrophy at C3-4 without significant stenosis.   MRI lumbar spine 01/11/2013: Sub-centimeter metastases within the right pedicle of L1, the vertebral body at L1, the right pedicle at L2, the central S2 segment and the left sided S2 segment. 2.5 cm metastasis anteriorly within the L2 vertebral body, at the site of the pathologic endplate Schmorl's node. No neural compression by tumor.  Degenerative changes which could be a cause of back pain. Bilateral facet arthropathy at L2-3. Lateral recess stenosis at L4-5 left worse than right. Left foraminal encroachment by small disc herniation at L2-3.   Labs 02/17/2014: TSH 2.213, HbA1c 6.0, CK 30, vitamin B12 803, HIV NR  Lab Results  Component Value Date   OHYWVPXT06 269 06/28/2014   Lab Results  Component Value Date   TSH 0.520 12/12/2014   Lab Results  Component Value Date   HGBA1C 5.9* 12/12/2014     IMPRESSION/PLAN: 1.  Left sided neck pain due to multilevel cervical central and foraminal stenosis worse at C5-6 and C6-7.  Images were reviewed with patient and daughter to explain her left sided symptoms.   - Unable to tolerate neck PT  - She is already on a number of pain medications and decadron making options limited to epidural  steroid injections or surgery consult.  She is not interested in surgery and does not feel strongly about ESI, so will manage her pain with her current regimen.   - OK to take baclofen 7m as needed.  Discontinue flexeril 59mdue to sedation  2.  Chemotherapy-induced neuropathy (  Velcade)  - EMG deferred by patient   - Continue gabapentin 682m four times daily  3.  Multiple myeloma undergoing chemotherapy, followed by Dr. MJulien Nordmann 4.  TIA (2015) manifesting with left arm and neck tingling of her tongue and transient painful weakness of her right leg  - MRI/A brain negative  5.  Return to clinic if symptoms worsen   The duration of this appointment visit was 30 minutes of face-to-face time with the patient.  Greater than 50% of this time was spent in counseling, explanation of diagnosis, planning of further management, and coordination of care.   Thank you for allowing me to participate in patient's care.  If I can answer any additional questions, I would be pleased to do so.    Sincerely,    Donika K. PPosey Pronto DO

## 2015-01-02 ENCOUNTER — Other Ambulatory Visit: Payer: Self-pay | Admitting: Neurology

## 2015-01-03 ENCOUNTER — Other Ambulatory Visit: Payer: Self-pay | Admitting: *Deleted

## 2015-01-03 MED ORDER — BACLOFEN 10 MG PO TABS: 10.0000 mg | ORAL_TABLET | Freq: Two times a day (BID) | ORAL | Status: DC | PRN

## 2015-01-05 ENCOUNTER — Encounter: Payer: Self-pay | Admitting: Internal Medicine

## 2015-01-05 NOTE — Progress Notes (Signed)
I placed moh forms on desk of nurse for dr. Julien Nordmann

## 2015-01-06 ENCOUNTER — Encounter: Payer: Self-pay | Admitting: Internal Medicine

## 2015-01-06 NOTE — Progress Notes (Signed)
I called to let her know the forms are ready for her to pick up at front desk with ms. wilma

## 2015-01-08 ENCOUNTER — Other Ambulatory Visit: Payer: Self-pay | Admitting: Internal Medicine

## 2015-01-09 ENCOUNTER — Other Ambulatory Visit (HOSPITAL_BASED_OUTPATIENT_CLINIC_OR_DEPARTMENT_OTHER): Payer: Medicare Other

## 2015-01-09 DIAGNOSIS — C9000 Multiple myeloma not having achieved remission: Secondary | ICD-10-CM | POA: Diagnosis not present

## 2015-01-09 DIAGNOSIS — M79605 Pain in left leg: Secondary | ICD-10-CM

## 2015-01-09 DIAGNOSIS — M7989 Other specified soft tissue disorders: Secondary | ICD-10-CM

## 2015-01-09 DIAGNOSIS — M79609 Pain in unspecified limb: Secondary | ICD-10-CM | POA: Diagnosis not present

## 2015-01-09 DIAGNOSIS — C9002 Multiple myeloma in relapse: Secondary | ICD-10-CM | POA: Diagnosis not present

## 2015-01-09 LAB — COMPREHENSIVE METABOLIC PANEL (CC13)
ALK PHOS: 96 U/L (ref 40–150)
ALT: 12 U/L (ref 0–55)
AST: 12 U/L (ref 5–34)
Albumin: 3.3 g/dL — ABNORMAL LOW (ref 3.5–5.0)
Anion Gap: 7 mEq/L (ref 3–11)
BUN: 13.6 mg/dL (ref 7.0–26.0)
CALCIUM: 10.3 mg/dL (ref 8.4–10.4)
CO2: 24 mEq/L (ref 22–29)
CREATININE: 0.9 mg/dL (ref 0.6–1.1)
Chloride: 111 mEq/L — ABNORMAL HIGH (ref 98–109)
EGFR: 84 mL/min/{1.73_m2} — ABNORMAL LOW (ref >90)
GLUCOSE: 87 mg/dL (ref 70–140)
POTASSIUM: 4 meq/L (ref 3.5–5.1)
Sodium: 142 mEq/L (ref 136–145)
TOTAL PROTEIN: 6.4 g/dL (ref 6.4–8.3)
Total Bilirubin: 0.47 mg/dL (ref 0.20–1.20)

## 2015-01-09 LAB — CBC WITH DIFFERENTIAL/PLATELET
BASO%: 0.7 % (ref 0.0–2.0)
BASOS ABS: 0 10*3/uL (ref 0.0–0.1)
EOS ABS: 0.1 10*3/uL (ref 0.0–0.5)
EOS%: 3.3 % (ref 0.0–7.0)
HCT: 35.2 % (ref 34.8–46.6)
HEMOGLOBIN: 11.4 g/dL — AB (ref 11.6–15.9)
LYMPH%: 27.5 % (ref 14.0–49.7)
MCH: 31.7 pg (ref 25.1–34.0)
MCHC: 32.5 g/dL (ref 31.5–36.0)
MCV: 97.5 fL (ref 79.5–101.0)
MONO#: 0.6 10*3/uL (ref 0.1–0.9)
MONO%: 17.7 % — AB (ref 0.0–14.0)
NEUT%: 50.8 % (ref 38.4–76.8)
NEUTROS ABS: 1.7 10*3/uL (ref 1.5–6.5)
PLATELETS: 271 10*3/uL (ref 145–400)
RBC: 3.61 10*6/uL — ABNORMAL LOW (ref 3.70–5.45)
RDW: 17 % — ABNORMAL HIGH (ref 11.2–14.5)
WBC: 3.3 10*3/uL — AB (ref 3.9–10.3)
lymph#: 0.9 10*3/uL (ref 0.9–3.3)

## 2015-01-09 LAB — LACTATE DEHYDROGENASE (CC13): LDH: 148 U/L (ref 125–245)

## 2015-01-11 ENCOUNTER — Other Ambulatory Visit: Payer: Self-pay | Admitting: Medical Oncology

## 2015-01-11 ENCOUNTER — Telehealth: Payer: Self-pay | Admitting: Medical Oncology

## 2015-01-11 ENCOUNTER — Telehealth: Payer: Self-pay | Admitting: *Deleted

## 2015-01-11 DIAGNOSIS — C9000 Multiple myeloma not having achieved remission: Secondary | ICD-10-CM

## 2015-01-11 LAB — IGG, IGA, IGM
IGM, SERUM: 28 mg/dL — AB (ref 52–322)
IgA: 171 mg/dL (ref 69–380)
IgG (Immunoglobin G), Serum: 967 mg/dL (ref 690–1700)

## 2015-01-11 LAB — BETA 2 MICROGLOBULIN, SERUM: Beta-2 Microglobulin: 2.25 mg/L (ref <=2.51)

## 2015-01-11 LAB — KAPPA/LAMBDA LIGHT CHAINS
KAPPA FREE LGHT CHN: 3.18 mg/dL — AB (ref 0.33–1.94)
Kappa:Lambda Ratio: 0.33 (ref 0.26–1.65)
Lambda Free Lght Chn: 9.64 mg/dL — ABNORMAL HIGH (ref 0.57–2.63)

## 2015-01-11 MED ORDER — POMALIDOMIDE 4 MG PO CAPS: 4.0000 mg | ORAL_CAPSULE | Freq: Every day | ORAL | Status: DC

## 2015-01-11 NOTE — Telephone Encounter (Signed)
VM message received from patient @ 10:28 am. TC back to patient . She is asking about her Pomalyst. She is ready to start a new cycle but has not received the medication. She called the specialty pharmacy and they told her they had not received prescription yet. Pt would like to know if she is to start next cycle of Pomalyst

## 2015-01-11 NOTE — Telephone Encounter (Signed)
Pt notified  that I sent rx today for pomalyst.

## 2015-01-11 NOTE — Telephone Encounter (Signed)
I called [facility] and told them to put a hold on pt refill for pomalyst . I also notified pt that she needs to see Julien Nordmann first before rx is refilled.

## 2015-01-13 ENCOUNTER — Encounter (HOSPITAL_COMMUNITY): Payer: Self-pay

## 2015-01-13 ENCOUNTER — Telehealth: Payer: Self-pay | Admitting: *Deleted

## 2015-01-13 ENCOUNTER — Emergency Department (HOSPITAL_COMMUNITY)
Admission: EM | Admit: 2015-01-13 | Discharge: 2015-01-13 | Disposition: A | Discharge status: Home / Self Care | Payer: Medicare Other | Attending: Emergency Medicine | Admitting: Emergency Medicine

## 2015-01-13 DIAGNOSIS — Z8673 Personal history of transient ischemic attack (TIA), and cerebral infarction without residual deficits: Secondary | ICD-10-CM | POA: Insufficient documentation

## 2015-01-13 DIAGNOSIS — E780 Pure hypercholesterolemia: Secondary | ICD-10-CM | POA: Insufficient documentation

## 2015-01-13 DIAGNOSIS — K219 Gastro-esophageal reflux disease without esophagitis: Secondary | ICD-10-CM | POA: Insufficient documentation

## 2015-01-13 DIAGNOSIS — Z79899 Other long term (current) drug therapy: Secondary | ICD-10-CM | POA: Insufficient documentation

## 2015-01-13 DIAGNOSIS — Z85830 Personal history of malignant neoplasm of bone: Secondary | ICD-10-CM | POA: Diagnosis not present

## 2015-01-13 DIAGNOSIS — Z8579 Personal history of other malignant neoplasms of lymphoid, hematopoietic and related tissues: Secondary | ICD-10-CM | POA: Insufficient documentation

## 2015-01-13 DIAGNOSIS — Z8701 Personal history of pneumonia (recurrent): Secondary | ICD-10-CM | POA: Insufficient documentation

## 2015-01-13 DIAGNOSIS — R197 Diarrhea, unspecified: Secondary | ICD-10-CM | POA: Insufficient documentation

## 2015-01-13 DIAGNOSIS — R531 Weakness: Secondary | ICD-10-CM | POA: Diagnosis not present

## 2015-01-13 DIAGNOSIS — E039 Hypothyroidism, unspecified: Secondary | ICD-10-CM | POA: Diagnosis not present

## 2015-01-13 DIAGNOSIS — R1110 Vomiting, unspecified: Secondary | ICD-10-CM | POA: Insufficient documentation

## 2015-01-13 DIAGNOSIS — R52 Pain, unspecified: Secondary | ICD-10-CM | POA: Insufficient documentation

## 2015-01-13 DIAGNOSIS — Z7982 Long term (current) use of aspirin: Secondary | ICD-10-CM | POA: Insufficient documentation

## 2015-01-13 DIAGNOSIS — Z87891 Personal history of nicotine dependence: Secondary | ICD-10-CM | POA: Diagnosis not present

## 2015-01-13 DIAGNOSIS — Z8781 Personal history of (healed) traumatic fracture: Secondary | ICD-10-CM | POA: Insufficient documentation

## 2015-01-13 LAB — URINALYSIS, ROUTINE W REFLEX MICROSCOPIC
BILIRUBIN URINE: NEGATIVE
GLUCOSE, UA: NEGATIVE mg/dL
HGB URINE DIPSTICK: NEGATIVE
KETONES UR: NEGATIVE mg/dL
LEUKOCYTES UA: NEGATIVE
NITRITE: NEGATIVE
PH: 7 (ref 5.0–8.0)
Protein, ur: NEGATIVE mg/dL
Specific Gravity, Urine: 1.007 (ref 1.005–1.030)
Urobilinogen, UA: 0.2 mg/dL (ref 0.0–1.0)

## 2015-01-13 LAB — CBC WITH DIFFERENTIAL/PLATELET
BASOS PCT: 1 % (ref 0–1)
Basophils Absolute: 0 10*3/uL (ref 0.0–0.1)
EOS ABS: 0.1 10*3/uL (ref 0.0–0.7)
EOS PCT: 3 % (ref 0–5)
HCT: 34 % — ABNORMAL LOW (ref 36.0–46.0)
Hemoglobin: 10.9 g/dL — ABNORMAL LOW (ref 12.0–15.0)
LYMPHS ABS: 1 10*3/uL (ref 0.7–4.0)
Lymphocytes Relative: 30 % (ref 12–46)
MCH: 31.6 pg (ref 26.0–34.0)
MCHC: 32.1 g/dL (ref 30.0–36.0)
MCV: 98.6 fL (ref 78.0–100.0)
MONOS PCT: 17 % — AB (ref 3–12)
Monocytes Absolute: 0.6 10*3/uL (ref 0.1–1.0)
NEUTROS ABS: 1.6 10*3/uL — AB (ref 1.7–7.7)
Neutrophils Relative %: 49 % (ref 43–77)
PLATELETS: 281 10*3/uL (ref 150–400)
RBC: 3.45 MIL/uL — ABNORMAL LOW (ref 3.87–5.11)
RDW: 15.7 % — ABNORMAL HIGH (ref 11.5–15.5)
WBC: 3.3 10*3/uL — ABNORMAL LOW (ref 4.0–10.5)

## 2015-01-13 LAB — COMPREHENSIVE METABOLIC PANEL
ALBUMIN: 3.4 g/dL — AB (ref 3.5–5.0)
ALT: 13 U/L — AB (ref 14–54)
AST: 11 U/L — ABNORMAL LOW (ref 15–41)
Alkaline Phosphatase: 51 U/L (ref 38–126)
Anion gap: 4 — ABNORMAL LOW (ref 5–15)
BUN: 7 mg/dL (ref 6–20)
CO2: 25 mmol/L (ref 22–32)
Calcium: 7.9 mg/dL — ABNORMAL LOW (ref 8.9–10.3)
Chloride: 109 mmol/L (ref 101–111)
Creatinine, Ser: 0.71 mg/dL (ref 0.44–1.00)
GFR calc non Af Amer: >60 mL/min (ref >60)
GLUCOSE: 83 mg/dL (ref 65–99)
Potassium: 3.2 mmol/L — ABNORMAL LOW (ref 3.5–5.1)
Sodium: 138 mmol/L (ref 135–145)
TOTAL PROTEIN: 6.8 g/dL (ref 6.5–8.1)
Total Bilirubin: 1.2 mg/dL (ref 0.3–1.2)

## 2015-01-13 LAB — POC OCCULT BLOOD, ED: Fecal Occult Bld: NEGATIVE

## 2015-01-13 LAB — LIPASE, BLOOD: LIPASE: 17 U/L — AB (ref 22–51)

## 2015-01-13 MED ORDER — SODIUM CHLORIDE 0.9 % IV BOLUS (SEPSIS)
1000.0000 mL | Freq: Once | INTRAVENOUS | Status: AC
  Administered 2015-01-13: 1000 mL via INTRAVENOUS

## 2015-01-13 MED ORDER — POTASSIUM CHLORIDE CRYS ER 10 MEQ PO TBCR
10.0000 meq | EXTENDED_RELEASE_TABLET | Freq: Once | ORAL | Status: AC
  Administered 2015-01-13: 10 meq via ORAL
  Filled 2015-01-13: qty 1

## 2015-01-13 MED ORDER — HEPARIN SOD (PORK) LOCK FLUSH 100 UNIT/ML IV SOLN
500.0000 [IU] | Freq: Once | INTRAVENOUS | Status: AC
  Administered 2015-01-13: 500 [IU]
  Filled 2015-01-13: qty 5

## 2015-01-13 NOTE — Discharge Instructions (Signed)
Nausea and Vomiting Nausea means you feel sick to your stomach. Throwing up (vomiting) is a reflex where stomach contents come out of your mouth. HOME CARE   Take medicine as told by your doctor.  Do not force yourself to eat. However, you do need to drink fluids.  If you feel like eating, eat a normal diet as told by your doctor.  Eat rice, wheat, potatoes, bread, lean meats, yogurt, fruits, and vegetables.  Avoid high-fat foods.  Drink enough fluids to keep your pee (urine) clear or pale yellow.  Ask your doctor how to replace body fluid losses (rehydrate). Signs of body fluid loss (dehydration) include:  Feeling very thirsty.  Dry lips and mouth.  Feeling dizzy.  Dark pee.  Peeing less than normal.  Feeling confused.  Fast breathing or heart rate. GET HELP RIGHT AWAY IF:   You have blood in your throw up.  You have black or bloody poop (stool).  You have a bad headache or stiff neck.  You feel confused.  You have bad belly (abdominal) pain.  You have chest pain or trouble breathing.  You do not pee at least once every 8 hours.  You have cold, clammy skin.  You keep throwing up after 24 to 48 hours.  You have a fever. MAKE SURE YOU:   Understand these instructions.  Will watch your condition.  Will get help right away if you are not doing well or get worse. Document Released: 12/04/2007 Document Revised: 09/09/2011 Document Reviewed: 11/16/2010 [facility] Patient Information 2015 [place], [place]. This information is not intended to replace advice given to you by your health care provider. Make sure you discuss any questions you have with your health care provider.  Diarrhea Diarrhea is frequent loose and watery bowel movements. It can cause you to feel weak and dehydrated. Dehydration can cause you to become tired and thirsty, have a dry mouth, and have decreased urination that often is dark yellow. Diarrhea is a sign of another problem, most often an  infection that will not last long. In most cases, diarrhea typically lasts 2-3 days. However, it can last longer if it is a sign of something more serious. It is important to treat your diarrhea as directed by your caregiver to lessen or prevent future episodes of diarrhea. CAUSES  Some common causes include:  Gastrointestinal infections caused by viruses, bacteria, or parasites.  Food poisoning or food allergies.  Certain medicines, such as antibiotics, chemotherapy, and laxatives.  Artificial sweeteners and fructose.  Digestive disorders. HOME CARE INSTRUCTIONS  Ensure adequate fluid intake (hydration): Have 1 cup (8 oz) of fluid for each diarrhea episode. Avoid fluids that contain simple sugars or sports drinks, fruit juices, whole milk products, and sodas. Your urine should be clear or pale yellow if you are drinking enough fluids. Hydrate with an oral rehydration solution that you can purchase at pharmacies, retail stores, and online. You can prepare an oral rehydration solution at home by mixing the following ingredients together:   - tsp table salt.   tsp baking soda.   tsp salt substitute containing potassium chloride.  1  tablespoons sugar.  1 L (34 oz) of water.  Certain foods and beverages may increase the speed at which food moves through the gastrointestinal (GI) tract. These foods and beverages should be avoided and include:  Caffeinated and alcoholic beverages.  High-fiber foods, such as raw fruits and vegetables, nuts, seeds, and whole grain breads and cereals.  Foods and beverages sweetened with  sugar alcohols, such as xylitol, sorbitol, and mannitol.  Some foods may be well tolerated and may help thicken stool including:  Starchy foods, such as rice, toast, pasta, low-sugar cereal, oatmeal, grits, baked potatoes, crackers, and bagels.  Bananas.  Applesauce.  Add probiotic-rich foods to help increase healthy bacteria in the GI tract, such as yogurt and  fermented milk products.  Wash your hands well after each diarrhea episode.  Only take over-the-counter or prescription medicines as directed by your caregiver.  Take a warm bath to relieve any burning or pain from frequent diarrhea episodes. SEEK IMMEDIATE MEDICAL CARE IF:   You are unable to keep fluids down.  You have persistent vomiting.  You have blood in your stool, or your stools are black and tarry.  You do not urinate in 6-8 hours, or there is only a small amount of very dark urine.  You have abdominal pain that increases or localizes.  You have weakness, dizziness, confusion, or light-headedness.  You have a severe headache.  Your diarrhea gets worse or does not get better.  You have a fever or persistent symptoms for more than 2-3 days.  You have a fever and your symptoms suddenly get worse. MAKE SURE YOU:   Understand these instructions.  Will watch your condition.  Will get help right away if you are not doing well or get worse. Document Released: 06/07/2002 Document Revised: 11/01/2013 Document Reviewed: 02/23/2012 [facility] Patient Information 2015 [place], [place]. This information is not intended to replace advice given to you by your health care provider. Make sure you discuss any questions you have with your health care provider.

## 2015-01-13 NOTE — ED Provider Notes (Signed)
CSN: [id number]     Arrival date & time 01/13/15  1323 History   First MD Initiated Contact with Patient 01/13/15 1459     Chief Complaint  Patient presents with  . Generalized Body Aches  . Diarrhea     (Consider location/radiation/quality/duration/timing/severity/associated sxs/prior Treatment) Patient is a 64 y.o. female presenting with diarrhea. The history is provided by the patient. No language interpreter was used.  Diarrhea Associated symptoms: abdominal pain and vomiting   Associated symptoms: no fever   Ms. Ley is a 64 y.o female with a history of hyperlipidemia, hypothyroidism, and multiple myeloma, who presents for multiple episodes of diarrhea and vomiting, as well as weakness since yesterday.  She is currently on chemo and had her last infusion 7 days ago.  She was scheduled for her next treatment today but was told that it would start next week since she is not feeling well. She is complaining of black stools today. She took imodium yesterday and today. She states she called Dr. Lew Dawes (oncology) office today who advised her to come to the ED. She also states that she has been bruising easily in the past week.  She denies any dizziness, fever, chills, chest pain, shortness of breath, cough, hematemesis, dysuria, hematuria, or urinary frequency.   Past Medical History  Diagnosis Date  . Hypercholesterolemia   . Compression fracture 04/08/2007    pathologic compression fracture  . Hypothyroidism   . FHx: chemotherapy     s/p 5 cycle revlimid/low dose decadron,s/p velcade,doxil,decadron,  . Hx of radiation therapy 05/05/07-05/18/07,& 03/05/11-03/21/11-    l3&l5 in 2008, t2-t6 03/2011  . GERD (gastroesophageal reflux disease)   . Insomnia     associated with steroids  . Constipation     takes oxycontin,vicodin  . Hx of radiation therapy 05/05/2007 to 05/18/2007    palliative, L3-5  . Hx of radiation therapy 03/05/2011 to 03/21/2011    palliative T2-T6, c-spine  .  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 PONV (postoperative nausea and vomiting)   . Multiple myeloma dx'd 2009  . Metastasis to bone   . Family history of anesthesia complication     "daughter gets PONV too"  . Pneumonia     "several times"  . Stroke 2014    denies residual on 01/27/2014   Past Surgical History  Procedure Laterality Date  . Knee arthroscopy Right     "put pin in"  . Knee arthroscopy Right     "took pin out and corrected what was wrong"  . Video bronchoscopy  07/30/2011    Procedure: VIDEO BRONCHOSCOPY WITHOUT FLUORO;  Surgeon: Kathee Delton, MD;  Location: [facility];  Service: Cardiopulmonary;  Laterality: Bilateral;  . Cholecystectomy  1980's  . Portacath placement Right 2009  . Vaginal hysterectomy  1980's   Family History  Problem Relation Age of Onset  . Lung cancer Brother   . Colon cancer Maternal Uncle   . Breast cancer Maternal Grandmother    History  Substance Use Topics  . Smoking status: Former Smoker -- 0.25 packs/day for 6 years    Types: Cigarettes    Quit date: 08/27/1978  . Smokeless tobacco: Never Used  . Alcohol Use: No     Comment: "quit drinking in the 1980's", previously drank on the weekend   OB History    No data available     Review of Systems  Constitutional: Negative for fever.  Respiratory: Negative for shortness  of breath.   Cardiovascular: Negative for chest pain.  Gastrointestinal: Positive for vomiting, abdominal pain and diarrhea.  Genitourinary: Negative for dysuria.  Neurological: Positive for weakness. Negative for dizziness.  All other systems reviewed and are negative.     Allergies  Review of patient's allergies indicates no known allergies.  Home Medications   Prior to Admission medications   Medication Sig Start Date End Date Taking? Authorizing Provider  ALPRAZolam Duanne Moron) 0.5 MG tablet Take 1/2 to 1 tablet 2 to 3 x daily as needed for nerves Patient taking  differently: Take 0.5 mg by mouth 3 (three) times daily as needed.  11/13/14  Yes Unk Pinto, MD  aspirin 81 MG tablet Take 81 mg by mouth daily.   Yes Historical Provider, MD  baclofen (LIORESAL) 10 MG tablet Take 1 tablet (10 mg total) by mouth 2 (two) times daily as needed for muscle spasms. 01/03/15  Yes Donika K Patel, DO  citalopram (CELEXA) 20 MG tablet TAKE 1 TABLET (20 MG TOTAL) BY MOUTH DAILY. 12/07/14  Yes Historical Provider, MD  diphenoxylate-atropine (LOMOTIL) 2.5-0.025 MG per tablet Take 2 tablets by mouth 4 (four) times daily as needed for diarrhea or loose stools. 11/09/14  Yes Orpah Greek, MD  Eszopiclone 3 MG TABS Take 1 tablet (3 mg total) by mouth at bedtime as needed (sleep). Take immediately before bedtime 10/21/14  Yes Unk Pinto, MD  fexofenadine [facility]) 60 MG tablet Take 1 tablet daily for allergies 11/26/14  Yes Unk Pinto, MD  furosemide (LASIX) 40 MG tablet TAKE 1 TABLET BY MOUTH TWICE A DAY FOR FLUID AND SWELLING 02/07/14  Yes Unk Pinto, MD  gabapentin (NEURONTIN) 600 MG tablet Take 600 mg by mouth 4 (four) times daily. For pain or cramps   Yes Historical Provider, MD  HYDROcodone-acetaminophen (NORCO) 7.5-325 MG per tablet Take 1 tablet by mouth every 6 (six) hours as needed for moderate pain. 12/15/14  Yes Curt Bears, MD  hyoscyamine (LEVSIN SL) 0.125 MG SL tablet Place 1 tablet (0.125 mg total) under the tongue every 6 (six) hours as needed. Patient taking differently: Place 0.125 mg under the tongue every 6 (six) hours as needed for cramping.  04/11/14  Yes Willia Craze, NP  KLOR-CON 10 10 MEQ tablet Take 10 mEq by mouth 2 (two) times daily. 12/05/14  Yes Historical Provider, MD  levothyroxine (SYNTHROID, LEVOTHROID) 100 MCG tablet Take 100 mcg by mouth daily before breakfast.    Yes Historical Provider, MD  Magnesium Hydroxide 400 MG CHEW Chew 3 tablets by mouth daily.   Yes Historical Provider, MD  morphine (MS CONTIN) 15 MG 12 hr  tablet Take 1 tablet (15 mg total) by mouth every 12 (twelve) hours. 12/15/14  Yes Curt Bears, MD  Multiple Vitamin (MULITIVITAMIN WITH MINERALS) TABS Take 1 tablet by mouth every morning.    Yes Historical Provider, MD  ondansetron (ZOFRAN-ODT) 8 MG disintegrating tablet TAKE 1 TABLET (8 MG TOTAL) BY MOUTH EVERY 8 (EIGHT) HOURS AS NEEDED FOR NAUSEA OR VOMITING. 11/22/14  Yes Unk Pinto, MD  pantoprazole (PROTONIX) 40 MG tablet TAKE 1 TABLET (40 MG TOTAL) BY MOUTH 2 (TWO) TIMES DAILY. 09/14/14  Yes Unk Pinto, MD  promethazine-dextromethorphan (PROMETHAZINE-DM) 6.25-15 MG/5ML syrup TAKE 5MLS BY MOUTH 4 TIMES A DAY AS NEEDED FOR COUGH 01/09/15  Yes Vicie Mutters, [name]  baclofen (LIORESAL) 10 MG tablet Take 1 tablet (10 mg total) by mouth 2 (two) times daily as needed. Patient not taking: Reported on 01/13/2015 01/03/15  Donika K Patel, DO  dexamethasone (DECADRON) 4 MG tablet TAKE 10 TABLETS BY MOUTH EVERY FRIDAY 12/09/14   Curt Bears, MD  pomalidomide (POMALYST) 4 MG capsule Take 1 capsule (4 mg total) by mouth daily. Take with water on days 1-21. Repeat every 28 days. 01/11/15   Curt Bears, MD   BP 108/62 mmHg  Pulse 54  Temp(Src) 98.1 F (36.7 C) (Oral)  Resp 16  SpO2 97% Physical Exam  Constitutional: She is oriented to person, place, and time. She appears well-developed and well-nourished. No distress.  HENT:  Head: Normocephalic and atraumatic.  Eyes: Conjunctivae are normal.  Neck: Normal range of motion. Neck supple.  Cardiovascular: Normal rate, regular rhythm and normal heart sounds.   Pulmonary/Chest: Breath sounds normal. No respiratory distress. She has no wheezes. She has no rales. She exhibits no tenderness.  Right sided chest port.   Abdominal: Soft. Normal appearance. She exhibits no distension and no mass. There is no tenderness. There is no rigidity, no rebound and no guarding.  No reproducible abdominal tenderness.   Musculoskeletal: Normal range of  motion.  No bruising visualized on the lower or upper bilateral extremities.   Neurological: She is alert and oriented to person, place, and time.  Skin: Skin is warm and dry. She is not diaphoretic.  Psychiatric: She has a normal mood and affect. Her behavior is normal.  Nursing note and vitals reviewed.   ED Course  Procedures (including critical care time) Labs Review Labs Reviewed  CBC WITH DIFFERENTIAL/PLATELET - Abnormal; Notable for the following:    WBC 3.3 (*)    RBC 3.45 (*)    Hemoglobin 10.9 (*)    HCT 34.0 (*)    RDW 15.7 (*)    Neutro Abs 1.6 (*)    Monocytes Relative 17 (*)    All other components within normal limits  COMPREHENSIVE METABOLIC PANEL - Abnormal; Notable for the following:    Potassium 3.2 (*)    Calcium 7.9 (*)    Albumin 3.4 (*)    AST 11 (*)    ALT 13 (*)    Anion gap 4 (*)    All other components within normal limits  LIPASE, BLOOD - Abnormal; Notable for the following:    Lipase 17 (*)    All other components within normal limits  URINALYSIS, ROUTINE W REFLEX MICROSCOPIC (NOT AT [facility])  POC OCCULT BLOOD, ED    Imaging Review No results found.   EKG Interpretation None      MDM   Final diagnoses:  Vomiting and diarrhea  Vitals stable.  Patient is afebrile. She is mildly hypokalemic. Otherwise, labs are not concerning and comparable to previous labs. Guaiac negative. Medications  heparin lock flush 100 unit/mL (not administered)  sodium chloride 0.9 % bolus 1,000 mL (0 mLs Intravenous Stopped 01/13/15 1752)  potassium chloride (K-DUR,KLOR-CON) CR tablet 10 mEq (10 mEq Oral Given 01/13/15 1724)  Recheck: Patient states she is feeling much better after fluids. I discussed return precautions such as fever or inability to hold down fluids. Patient was able to tolerate PO fluids. I discussed keeping her appointment with Dr. Earlie Server on Monday and she verbally agrees with the plan.     Ottie Glazier, [name] 01/13/15 [place], MD 01/14/15 [phone]

## 2015-01-13 NOTE — ED Notes (Signed)
Pt presents with c/o generalized body aches, diarrhea, and vomiting that started yesterday. Pt reports she is a cancer pt, last chemo treatment was approx 9 days ago. Pt reports that her symptoms started yesterday and she was told to come here for treatment and IV fluids.

## 2015-01-13 NOTE — Telephone Encounter (Signed)
TC from patient who states she has been sick for the last 2 days with diarrhea (>6 lose stools yesterday and 4 so far today despite imodium) She states she has been hurting and aching all over, including her chest, back legs.  She also has no bruising on her legs without having injured them. C/o sweating at night, watery eyes that get hard to open.  Discussed with Dr. Julien Nordmann and he recommended she go to ED as soon as possible as she can get very sick, very quickly. TC back to patient and instructed her to go to ED right now. She agreed and said she would and has someone with her. She states she will let us know how she is.

## 2015-01-13 NOTE — ED Notes (Signed)
Bed: WA04 Expected date:  Expected time:  Means of arrival:  Comments: Hold for triage 

## 2015-01-14 ENCOUNTER — Other Ambulatory Visit: Payer: Self-pay | Admitting: Physician Assistant

## 2015-01-16 ENCOUNTER — Ambulatory Visit (HOSPITAL_BASED_OUTPATIENT_CLINIC_OR_DEPARTMENT_OTHER): Payer: Medicare Other | Admitting: Internal Medicine

## 2015-01-16 ENCOUNTER — Telehealth: Payer: Self-pay | Admitting: Internal Medicine

## 2015-01-16 ENCOUNTER — Encounter: Payer: Self-pay | Admitting: Internal Medicine

## 2015-01-16 ENCOUNTER — Telehealth: Payer: Self-pay | Admitting: Medical Oncology

## 2015-01-16 VITALS — BP 115/62 | HR 72 | Temp 98.2°F | Resp 18 | Ht 64.75 in | Wt 193.4 lb

## 2015-01-16 DIAGNOSIS — C9000 Multiple myeloma not having achieved remission: Secondary | ICD-10-CM | POA: Diagnosis not present

## 2015-01-16 DIAGNOSIS — Z5111 Encounter for antineoplastic chemotherapy: Secondary | ICD-10-CM

## 2015-01-16 MED ORDER — MORPHINE SULFATE ER 15 MG PO TBCR: 15.0000 mg | EXTENDED_RELEASE_TABLET | Freq: Two times a day (BID) | ORAL | Status: DC

## 2015-01-16 MED ORDER — HYDROCODONE-ACETAMINOPHEN 7.5-325 MG PO TABS: 1.0000 | ORAL_TABLET | Freq: Four times a day (QID) | ORAL | Status: DC | PRN

## 2015-01-16 NOTE — Telephone Encounter (Signed)
Called Teresa at [employer] and told her to resume pomalyst .

## 2015-01-16 NOTE — Progress Notes (Signed)
[place] [phone]   Fax:([phone] [place], [place] [address]  DIAGNOSIS: Recurrent multiple myeloma initially diagnosed in October 2008.   PRIOR THERAPY:  1. Status post palliative radiotherapy to the lumbar spine between L3 and L5. The patient received a total dose of 3000 cGy in 10 fractions under the care of Dr. Lisbeth Renshaw between May 05, 2007 through May 18, 2007. 2. Status post 5 cycles of systemic chemotherapy with Revlimid and low-dose Decadron with good response to this treatment. 3. Status post autologous peripheral blood stem cell transplant at [facility] on Nov 20, 2007 under the care of Dr. Valarie Merino. 4. Status post treatment for disease recurrence with Velcade, Doxil and Decadron. Last dose given Nov 09, 2009. Discontinued secondary to intolerance but the patient had a good response to treatment at that time. 5. Status post palliative radiotherapy to the T2-T6 thoracic vertebrae completed 03/21/2011 under the care of Dr. Lisbeth Renshaw. 6. Systemic chemotherapy with Velcade 1.3 mg per meter squared given on days 1, 4, 8 and 11, and Doxil at 30 mg per meter squared given on day 4 in addition to Decadron status post 4 cycles, discontinued secondary to intolerance. 7. Systemic therapy with Velcade 1.3 mg/M2 subcutaneously in addition to Decadron 40 mg by mouth on a weekly basis, status post 20 cycles. The patient had good response with this treatment but it is discontinue today secondary to worsening peripheral neuropathy. 8. Palliative radiotherapy to the skull lesion as well as the left hip area under the care of Dr. Lisbeth Renshaw. 9. Systemic chemotherapy with Carfilzomib 20 mg/M2 on days 1, 2,  8, 9, 13 and 16 every 4 weeks in addition to weekly Decadron 40 mg by mouth. First dose on 04/19/2013. Status post 4 cycles, discontinued recently secondary to cardiac  dysfunction.  CURRENT THERAPY:  1. Pomalyst 4 mg by mouth daily for 21 days every 4 weeks in addition to dexamethasone 40 mg on a weekly basis. First dose started 12/02/2013. Status post 13 cycles. She will start cycle #14 on 01/18/2015 2. Zometa 4 mg IV given every 3 months   INTERVAL HISTORY: [name] 64 y.o. female returns to the clinic today for follow up visit accompanied by her daughter. She was seen a few days ago at the emergency Department complaining of generalized body aches and diarrhea. The patient felt much better after receiving IV hydration with normal saline in addition to potassium replacement. The patient is feeling fine today with no specific complaints. She is tolerating her current treatment with Pomalyst fairly well with no significant adverse effects. She denied having any significant fatigue or weakness. The patient denied having any significant chest pain, shortness of breath, or hemoptysis. She has no nausea or vomiting. She has no weight loss or night sweats. She continues to have mild peripheral neuropathy and currently on gabapentin. She had repeat myeloma panel performed recently and she is here for evaluation and discussion of her lab results.  MEDICAL HISTORY: Past Medical History  Diagnosis Date  . Hypercholesterolemia   . Compression fracture 04/08/2007    pathologic compression fracture  . Hypothyroidism   . FHx: chemotherapy     s/p 5 cycle revlimid/low dose decadron,s/p velcade,doxil,decadron,  . Hx of radiation therapy 05/05/07-05/18/07,& 03/05/11-03/21/11-    l3&l5 in 2008, t2-t6 03/2011  . GERD (gastroesophageal reflux disease)   . Insomnia     associated with  steroids  . Constipation     takes oxycontin,vicodin  . Hx of radiation therapy 05/05/2007 to 05/18/2007    palliative, L3-5  . Hx of radiation therapy 03/05/2011 to 03/21/2011    palliative T2-T6, c-spine  . 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  PONV (postoperative nausea and vomiting)   . Multiple myeloma dx'd 2009  . Metastasis to bone   . Family history of anesthesia complication     "daughter gets PONV too"  . Pneumonia     "several times"  . Stroke 2014    denies residual on 01/27/2014    ALLERGIES:  has No Known Allergies.  MEDICATIONS:  Current Outpatient Prescriptions  Medication Sig Dispense Refill  . aspirin 81 MG tablet Take 81 mg by mouth daily.    . baclofen (LIORESAL) 10 MG tablet Take 1 tablet (10 mg total) by mouth 2 (two) times daily as needed. 30 tablet 1  . citalopram (CELEXA) 20 MG tablet TAKE 1 TABLET (20 MG TOTAL) BY MOUTH DAILY.  3  . cyclobenzaprine (FLEXERIL) 10 MG tablet TAKE 1/2 TO 1 TABLET BY MOUTH THREE TIMES DAILY FOR MUSCLE SPASMS 90 tablet 2  . dexamethasone (DECADRON) 4 MG tablet TAKE 10 TABLETS BY MOUTH EVERY FRIDAY 40 tablet 1  . diphenoxylate-atropine (LOMOTIL) 2.5-0.025 MG per tablet Take 2 tablets by mouth 4 (four) times daily as needed for diarrhea or loose stools. 30 tablet 0  . Eszopiclone 3 MG TABS Take 1 tablet (3 mg total) by mouth at bedtime as needed (sleep). Take immediately before bedtime 30 tablet 5  . fexofenadine (ALLEGRA) 60 MG tablet Take 1 tablet daily for allergies 90 tablet 1  . furosemide (LASIX) 40 MG tablet TAKE 1 TABLET BY MOUTH TWICE A DAY FOR FLUID AND SWELLING 60 tablet 10  . gabapentin (NEURONTIN) 600 MG tablet Take 600 mg by mouth 4 (four) times daily. For pain or cramps    . HYDROcodone-acetaminophen (NORCO) 7.5-325 MG per tablet Take 1 tablet by mouth every 6 (six) hours as needed for moderate pain. 30 tablet 0  . hyoscyamine (LEVSIN SL) 0.125 MG SL tablet Place 1 tablet (0.125 mg total) under the tongue every 6 (six) hours as needed. (Patient taking differently: Place 0.125 mg under the tongue every 6 (six) hours as needed for cramping. ) 30 tablet 4  . KLOR-CON 10 10 MEQ tablet Take 10 mEq by mouth 2 (two) times daily.  3  . levothyroxine (SYNTHROID, LEVOTHROID)  100 MCG tablet Take 100 mcg by mouth daily before breakfast.     . Magnesium Hydroxide 400 MG CHEW Chew 3 tablets by mouth daily.    Marland Kitchen morphine (MS CONTIN) 15 MG 12 hr tablet Take 1 tablet (15 mg total) by mouth every 12 (twelve) hours. 60 tablet 0  . Multiple Vitamin (MULITIVITAMIN WITH MINERALS) TABS Take 1 tablet by mouth every morning.     . pantoprazole (PROTONIX) 40 MG tablet TAKE 1 TABLET (40 MG TOTAL) BY MOUTH 2 (TWO) TIMES DAILY. 60 tablet 5  . ALPRAZolam (XANAX) 0.5 MG tablet Take 1/2 to 1 tablet 2 to 3 x daily as needed for nerves (Patient taking differently: Take 0.5 mg by mouth 3 (three) times daily as needed. ) 90 tablet 5  . baclofen (LIORESAL) 10 MG tablet Take 1 tablet (10 mg total) by mouth 2 (two) times daily as needed for muscle spasms. (Patient not taking: Reported on 01/16/2015) 30 each 0  .  ondansetron (ZOFRAN-ODT) 8 MG disintegrating tablet TAKE 1 TABLET (8 MG TOTAL) BY MOUTH EVERY 8 (EIGHT) HOURS AS NEEDED FOR NAUSEA OR VOMITING. (Patient not taking: Reported on 01/16/2015) 30 tablet 1  . pomalidomide (POMALYST) 4 MG capsule Take 1 capsule (4 mg total) by mouth daily. Take with water on days 1-21. Repeat every 28 days. (Patient not taking: Reported on 01/16/2015) 21 capsule 0  . promethazine-dextromethorphan (PROMETHAZINE-DM) 6.25-15 MG/5ML syrup TAKE 5MLS BY MOUTH 4 TIMES A DAY AS NEEDED FOR COUGH 180 mL 0   No current facility-administered medications for this visit.    SURGICAL HISTORY:  Past Surgical History  Procedure Laterality Date  . Knee arthroscopy Right     "put pin in"  . Knee arthroscopy Right     "took pin out and corrected what was wrong"  . Video bronchoscopy  07/30/2011    Procedure: VIDEO BRONCHOSCOPY WITHOUT FLUORO;  Surgeon: Kathee Delton, MD;  Location: [facility];  Service: Cardiopulmonary;  Laterality: Bilateral;  . Cholecystectomy  1980's  . Portacath placement Right 2009  . Vaginal hysterectomy  1980's    REVIEW OF SYSTEMS:  Constitutional:  negative Eyes: negative Ears, nose, mouth, throat, and face: negative Respiratory: negative Cardiovascular: negative Gastrointestinal: negative Genitourinary:negative Integument/breast: negative Hematologic/lymphatic: negative Musculoskeletal:positive for back pain Neurological: negative Behavioral/Psych: negative   PHYSICAL EXAMINATION: General appearance: alert, cooperative, fatigued and no distress Head: Normocephalic, without obvious abnormality, atraumatic Neck: no adenopathy, no JVD, supple, symmetrical, trachea midline and thyroid not enlarged, symmetric, no tenderness/mass/nodules Lymph nodes: Cervical, supraclavicular, and axillary nodes normal. Resp: clear to auscultation bilaterally Back: symmetric, no curvature. ROM normal. No CVA tenderness. Cardio: regular rate and rhythm, S1, S2 normal, no murmur, click, rub or gallop GI: soft, non-tender; bowel sounds normal; no masses,  no organomegaly Extremities: edema 1+ edema of left lower extremity. Neurologic: Alert and oriented X 3, normal strength and tone. Normal symmetric reflexes. Normal coordination and gait  ECOG PERFORMANCE STATUS: 1 - Symptomatic but completely ambulatory  Blood pressure 115/62, pulse 72, temperature 98.2 F (36.8 C), temperature source Oral, resp. rate 18, height 5' 4.75" (1.645 m), weight 193 lb 6.4 oz (87.726 kg).  LABORATORY DATA: Lab Results  Component Value Date   WBC 3.3* 01/13/2015   HGB 10.9* 01/13/2015   HCT 34.0* 01/13/2015   MCV 98.6 01/13/2015   PLT 281 01/13/2015      Chemistry      Component Value Date/Time   NA 138 01/13/2015 1559   NA 142 01/09/2015 1006   K 3.2* 01/13/2015 1559   K 4.0 01/09/2015 1006   CL 109 01/13/2015 1559   CL 105 12/17/2012 1015   CO2 25 01/13/2015 1559   CO2 24 01/09/2015 1006   BUN 7 01/13/2015 1559   BUN 13.6 01/09/2015 1006   CREATININE 0.71 01/13/2015 1559   CREATININE 0.9 01/09/2015 1006   CREATININE 0.73 12/12/2014 1158        Component Value Date/Time   CALCIUM 7.9* 01/13/2015 1559   CALCIUM 10.3 01/09/2015 1006   ALKPHOS 51 01/13/2015 1559   ALKPHOS 96 01/09/2015 1006   AST 11* 01/13/2015 1559   AST 12 01/09/2015 1006   ALT 13* 01/13/2015 1559   ALT 12 01/09/2015 1006   BILITOT 1.2 01/13/2015 1559   BILITOT 0.47 01/09/2015 1006     Other lab results: Beta-2 microglobulin 2.25, free kappa light chain 3.18, free lambda light chain 9.64 and a kappa/lambda ratio of 0.33. IgG 967, IgA 171 and IgM 28.  RADIOGRAPHIC STUDIES:  ASSESSMENT AND PLAN:  This is a very pleasant 64 years old [place] American female with recurrent multiple myeloma recently completed a course of treatment with Velcade and Decadron with improvement in her disease but this was discontinued secondary to peripheral neuropathy. The patient tolerated her treatment with Carfilzomib and Decadron fairly well except for the recent shortness breath and cough which was felt to be secondary to congestive heart failure from her treatment with Carfilzomib. This treatment was discontinued. She was started on treatment with Pomalyst and Decadron status post 13 cycles. She tolerated the last cycle of her treatment well. Her recent myeloma panel showed no significant disease progression. I recommended for the patient to proceed with cycle #14 as a scheduled. She will come back for follow-up visit in one month's for reevaluation after repeating myeloma panel. She would come back for followup visit in 4 weeks for evaluation after repeating CBC, comprehensive metabolic panel and LDH. The patient will continue her current treatment with Zometa every 3 months as scheduled.  She was advised to call immediately if she has any concerning symptoms in the interval. The patient voices understanding of current disease status and treatment options and is in agreement with the current care plan.  All questions were answered. The patient knows to call the clinic with any  problems, questions or concerns. We can certainly see the patient much sooner if necessary.  Disclaimer: This note was dictated with voice recognition software. Similar sounding words can inadvertently be transcribed and may be missed upon review.

## 2015-01-16 NOTE — Telephone Encounter (Signed)
Gave and pritned appt scheda and avs for pt for Aug

## 2015-01-17 DIAGNOSIS — H40012 Open angle with borderline findings, low risk, left eye: Secondary | ICD-10-CM | POA: Diagnosis not present

## 2015-01-24 ENCOUNTER — Telehealth: Payer: Self-pay | Admitting: *Deleted

## 2015-01-24 ENCOUNTER — Other Ambulatory Visit: Payer: Self-pay | Admitting: Internal Medicine

## 2015-01-24 NOTE — Telephone Encounter (Signed)
ON 01/24/15 LEFT MESSAGE ON KIM HAGERTY'S VOICE MAIL THAT PT.'S INSURANCE COMPANY WAS REQUESTING PT.'S MEDICAL RECORDS.

## 2015-01-26 ENCOUNTER — Telehealth: Payer: Self-pay

## 2015-01-26 NOTE — Telephone Encounter (Signed)
I told pt pt to call her insurance company because they need her permission on their release form.

## 2015-01-26 NOTE — Telephone Encounter (Signed)
Pt called stating that someone called her about getting her approval to send records to her insurance company. I called HIM and IOD asking if they requested release of information form - they both said no. LVM with Diane RN if she has called the pt for an ROI.

## 2015-01-26 NOTE — Telephone Encounter (Signed)
Pt asked for a call back.

## 2015-02-01 ENCOUNTER — Telehealth: Payer: Self-pay | Admitting: Medical Oncology

## 2015-02-01 NOTE — Telephone Encounter (Signed)
Faxed form to med records for [place] form

## 2015-02-07 ENCOUNTER — Other Ambulatory Visit: Payer: Self-pay | Admitting: Internal Medicine

## 2015-02-08 ENCOUNTER — Other Ambulatory Visit: Payer: Self-pay | Admitting: Internal Medicine

## 2015-02-08 DIAGNOSIS — Z7982 Long term (current) use of aspirin: Secondary | ICD-10-CM | POA: Diagnosis not present

## 2015-02-08 DIAGNOSIS — R413 Other amnesia: Secondary | ICD-10-CM | POA: Diagnosis not present

## 2015-02-08 DIAGNOSIS — I10 Essential (primary) hypertension: Secondary | ICD-10-CM | POA: Diagnosis not present

## 2015-02-08 DIAGNOSIS — Z8673 Personal history of transient ischemic attack (TIA), and cerebral infarction without residual deficits: Secondary | ICD-10-CM | POA: Diagnosis not present

## 2015-02-08 DIAGNOSIS — C9000 Multiple myeloma not having achieved remission: Secondary | ICD-10-CM | POA: Diagnosis not present

## 2015-02-08 DIAGNOSIS — K219 Gastro-esophageal reflux disease without esophagitis: Secondary | ICD-10-CM | POA: Diagnosis not present

## 2015-02-08 DIAGNOSIS — M4856XA Collapsed vertebra, not elsewhere classified, lumbar region, initial encounter for fracture: Secondary | ICD-10-CM | POA: Diagnosis not present

## 2015-02-08 DIAGNOSIS — M898X9 Other specified disorders of bone, unspecified site: Secondary | ICD-10-CM | POA: Diagnosis not present

## 2015-02-09 ENCOUNTER — Other Ambulatory Visit: Payer: Self-pay | Admitting: *Deleted

## 2015-02-09 DIAGNOSIS — C9000 Multiple myeloma not having achieved remission: Secondary | ICD-10-CM

## 2015-02-09 MED ORDER — POMALIDOMIDE 4 MG PO CAPS: 4.0000 mg | ORAL_CAPSULE | Freq: Every day | ORAL | Status: DC

## 2015-02-09 NOTE — Telephone Encounter (Signed)
Authorization #[id number]   Survey completed 02/07/15  Adult female not of productive potential.  Faxed to [employer] [phone]

## 2015-02-10 ENCOUNTER — Encounter: Payer: Self-pay | Admitting: Internal Medicine

## 2015-02-10 NOTE — Progress Notes (Signed)
Per [employer] they need the [employer] auth# [id number].  I faxed it back [phone]

## 2015-02-13 ENCOUNTER — Telehealth: Payer: Self-pay | Admitting: Medical Oncology

## 2015-02-13 ENCOUNTER — Other Ambulatory Visit: Payer: Self-pay | Admitting: *Deleted

## 2015-02-13 DIAGNOSIS — C9000 Multiple myeloma not having achieved remission: Secondary | ICD-10-CM

## 2015-02-13 MED ORDER — DIPHENOXYLATE-ATROPINE 2.5-0.025 MG PO TABS: 2.0000 | ORAL_TABLET | Freq: Four times a day (QID) | ORAL | Status: DC | PRN

## 2015-02-13 NOTE — Telephone Encounter (Signed)
I told pt not to  start pomalyst until she hears from [place] at her appt.

## 2015-02-14 ENCOUNTER — Other Ambulatory Visit (HOSPITAL_BASED_OUTPATIENT_CLINIC_OR_DEPARTMENT_OTHER): Payer: Medicare Other

## 2015-02-14 ENCOUNTER — Other Ambulatory Visit: Payer: Self-pay | Admitting: Medical Oncology

## 2015-02-14 DIAGNOSIS — C9000 Multiple myeloma not having achieved remission: Secondary | ICD-10-CM | POA: Diagnosis not present

## 2015-02-14 LAB — COMPREHENSIVE METABOLIC PANEL (CC13)
ALT: 12 U/L (ref 0–55)
ANION GAP: 6 meq/L (ref 3–11)
AST: 7 U/L (ref 5–34)
Albumin: 3.2 g/dL — ABNORMAL LOW (ref 3.5–5.0)
Alkaline Phosphatase: 56 U/L (ref 40–150)
BILIRUBIN TOTAL: 0.69 mg/dL (ref 0.20–1.20)
BUN: 15.2 mg/dL (ref 7.0–26.0)
CHLORIDE: 109 meq/L (ref 98–109)
CO2: 24 meq/L (ref 22–29)
Calcium: 9.4 mg/dL (ref 8.4–10.4)
Creatinine: 0.9 mg/dL (ref 0.6–1.1)
EGFR: 82 mL/min/{1.73_m2} — AB (ref >90)
GLUCOSE: 72 mg/dL (ref 70–140)
POTASSIUM: 4.1 meq/L (ref 3.5–5.1)
SODIUM: 140 meq/L (ref 136–145)
TOTAL PROTEIN: 6.2 g/dL — AB (ref 6.4–8.3)

## 2015-02-14 LAB — CBC WITH DIFFERENTIAL/PLATELET
BASO%: 0.4 % (ref 0.0–2.0)
BASOS ABS: 0 10*3/uL (ref 0.0–0.1)
EOS ABS: 0 10*3/uL (ref 0.0–0.5)
EOS%: 0.1 % (ref 0.0–7.0)
HCT: 32.2 % — ABNORMAL LOW (ref 34.8–46.6)
HGB: 10.4 g/dL — ABNORMAL LOW (ref 11.6–15.9)
LYMPH%: 19.9 % (ref 14.0–49.7)
MCH: 31.4 pg (ref 25.1–34.0)
MCHC: 32.3 g/dL (ref 31.5–36.0)
MCV: 97.1 fL (ref 79.5–101.0)
MONO#: 1.1 10*3/uL — AB (ref 0.1–0.9)
MONO%: 18.6 % — AB (ref 0.0–14.0)
NEUT#: 3.5 10*3/uL (ref 1.5–6.5)
NEUT%: 61 % (ref 38.4–76.8)
PLATELETS: 281 10*3/uL (ref 145–400)
RBC: 3.32 10*6/uL — ABNORMAL LOW (ref 3.70–5.45)
RDW: 16.9 % — ABNORMAL HIGH (ref 11.2–14.5)
WBC: 5.7 10*3/uL (ref 3.9–10.3)
lymph#: 1.1 10*3/uL (ref 0.9–3.3)

## 2015-02-14 LAB — LACTATE DEHYDROGENASE (CC13): LDH: 146 U/L (ref 125–245)

## 2015-02-16 ENCOUNTER — Encounter: Payer: Self-pay | Admitting: Physician Assistant

## 2015-02-16 ENCOUNTER — Ambulatory Visit (HOSPITAL_BASED_OUTPATIENT_CLINIC_OR_DEPARTMENT_OTHER): Payer: Medicare Other | Admitting: Physician Assistant

## 2015-02-16 ENCOUNTER — Other Ambulatory Visit: Payer: Medicare Other

## 2015-02-16 ENCOUNTER — Telehealth: Payer: Self-pay | Admitting: Internal Medicine

## 2015-02-16 VITALS — BP 116/59 | HR 18 | Temp 98.2°F | Resp 18 | Ht 64.75 in | Wt 195.5 lb

## 2015-02-16 DIAGNOSIS — C9000 Multiple myeloma not having achieved remission: Secondary | ICD-10-CM | POA: Diagnosis not present

## 2015-02-16 LAB — KAPPA/LAMBDA LIGHT CHAINS
KAPPA FREE LGHT CHN: 2.61 mg/dL — AB (ref 0.33–1.94)
KAPPA LAMBDA RATIO: 0.24 — AB (ref 0.26–1.65)
LAMBDA FREE LGHT CHN: 10.7 mg/dL — AB (ref 0.57–2.63)

## 2015-02-16 LAB — BETA 2 MICROGLOBULIN, SERUM: Beta-2 Microglobulin: 2.27 mg/L (ref <=2.51)

## 2015-02-16 MED ORDER — HYDROCODONE-ACETAMINOPHEN 7.5-325 MG PO TABS
1.0000 | ORAL_TABLET | Freq: Four times a day (QID) | ORAL | Status: DC | PRN
Start: 2015-02-16 — End: 2015-04-28

## 2015-02-16 MED ORDER — MORPHINE SULFATE ER 15 MG PO TBCR: 15.0000 mg | EXTENDED_RELEASE_TABLET | Freq: Two times a day (BID) | ORAL | Status: DC

## 2015-02-16 NOTE — Telephone Encounter (Signed)
Gave and printed appt sched and avs for pt for SEpt °

## 2015-02-16 NOTE — Progress Notes (Addendum)
[place] [phone]   Fax:([phone] [place], [place] [address]  DIAGNOSIS: Recurrent multiple myeloma initially diagnosed in October 2008.   PRIOR THERAPY:  1. Status post palliative radiotherapy to the lumbar spine between L3 and L5. The patient received a total dose of 3000 cGy in 10 fractions under the care of Dr. Lisbeth Renshaw between May 05, 2007 through May 18, 2007. 2. Status post 5 cycles of systemic chemotherapy with Revlimid and low-dose Decadron with good response to this treatment. 3. Status post autologous peripheral blood stem cell transplant at [facility] on Nov 20, 2007 under the care of Dr. Valarie Merino. 4. Status post treatment for disease recurrence with Velcade, Doxil and Decadron. Last dose given Nov 09, 2009. Discontinued secondary to intolerance but the patient had a good response to treatment at that time. 5. Status post palliative radiotherapy to the T2-T6 thoracic vertebrae completed 03/21/2011 under the care of Dr. Lisbeth Renshaw. 6. Systemic chemotherapy with Velcade 1.3 mg per meter squared given on days 1, 4, 8 and 11, and Doxil at 30 mg per meter squared given on day 4 in addition to Decadron status post 4 cycles, discontinued secondary to intolerance. 7. Systemic therapy with Velcade 1.3 mg/M2 subcutaneously in addition to Decadron 40 mg by mouth on a weekly basis, status post 20 cycles. The patient had good response with this treatment but it is discontinue today secondary to worsening peripheral neuropathy. 8. Palliative radiotherapy to the skull lesion as well as the left hip area under the care of Dr. Lisbeth Renshaw. 9. Systemic chemotherapy with Carfilzomib 20 mg/M2 on days 1, 2,  8, 9, 13 and 16 every 4 weeks in addition to weekly Decadron 40 mg by mouth. First dose on 04/19/2013. Status post 4 cycles, discontinued recently secondary to cardiac  dysfunction.  CURRENT THERAPY:  1. Pomalyst 4 mg by mouth daily for 21 days every 4 weeks in addition to dexamethasone 40 mg on a weekly basis. First dose started 12/02/2013. Status post 14 cycles completed on 02/08/2015. She will start cycle #15 on 02/17/2015 2. Zometa 4 mg IV given every 3 months   INTERVAL HISTORY: [name] 64 y.o. female returns to the clinic today for follow up visit accompanied by her daughter and grandson. She continues to complain of fatigue. She also reports that today she started again with diarrhea. She finds Lomotil more helpful in controlling her diarrhea.The patient is feeling fine today with no other specific complaints. She is tolerating her current treatment with Pomalyst fairly well with no significant adverse effects. She denied having any significant weakness. The patient denied having any significant chest pain, shortness of breath, or hemoptysis. She has no nausea or vomiting. She has no weight loss or night sweats. She continues to have mild peripheral neuropathy and currently on gabapentin. She had a myeloma panel drawn earlier today to re-evaluate her disease.  MEDICAL HISTORY: Past Medical History  Diagnosis Date  . Hypercholesterolemia   . Compression fracture 04/08/2007    pathologic compression fracture  . Hypothyroidism   . FHx: chemotherapy     s/p 5 cycle revlimid/low dose decadron,s/p velcade,doxil,decadron,  . Hx of radiation therapy 05/05/07-05/18/07,& 03/05/11-03/21/11-    l3&l5 in 2008, t2-t6 03/2011  . GERD (gastroesophageal reflux disease)   . Insomnia     associated with steroids  . Constipation     takes oxycontin,vicodin  . Hx of  radiation therapy 05/05/2007 to 05/18/2007    palliative, L3-5  . Hx of radiation therapy 03/05/2011 to 03/21/2011    palliative T2-T6, c-spine  . 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 PONV (postoperative nausea and vomiting)   . Multiple myeloma dx'd 2009  .  Metastasis to bone   . Family history of anesthesia complication     "daughter gets PONV too"  . Pneumonia     "several times"  . Stroke 2014    denies residual on 01/27/2014    ALLERGIES:  has No Known Allergies.  MEDICATIONS:  Current Outpatient Prescriptions  Medication Sig Dispense Refill  . ALPRAZolam (XANAX) 0.5 MG tablet Take 1/2 to 1 tablet 2 to 3 x daily as needed for nerves (Patient taking differently: Take 0.5 mg by mouth 3 (three) times daily as needed. ) 90 tablet 5  . aspirin 81 MG tablet Take 81 mg by mouth daily.    . baclofen (LIORESAL) 10 MG tablet Take 1 tablet (10 mg total) by mouth 2 (two) times daily as needed. 30 tablet 1  . baclofen (LIORESAL) 10 MG tablet Take 1 tablet (10 mg total) by mouth 2 (two) times daily as needed for muscle spasms. (Patient not taking: Reported on 01/16/2015) 30 each 0  . citalopram (CELEXA) 20 MG tablet TAKE 1 TABLET (20 MG TOTAL) BY MOUTH DAILY.  3  . cyclobenzaprine (FLEXERIL) 10 MG tablet TAKE 1/2 TO 1 TABLET BY MOUTH THREE TIMES DAILY FOR MUSCLE SPASMS 90 tablet 2  . dexamethasone (DECADRON) 4 MG tablet TAKE 10 TABLETS BY MOUTH EVERY FRIDAY 40 tablet 1  . diphenoxylate-atropine (LOMOTIL) 2.5-0.025 MG per tablet Take 2 tablets by mouth 4 (four) times daily as needed for diarrhea or loose stools. 30 tablet 1  . Eszopiclone 3 MG TABS Take 1 tablet (3 mg total) by mouth at bedtime as needed (sleep). Take immediately before bedtime 30 tablet 5  . fexofenadine (ALLEGRA) 60 MG tablet Take 1 tablet daily for allergies 90 tablet 1  . furosemide (LASIX) 40 MG tablet TAKE 1 TABLET BY MOUTH TWICE A DAY FOR FLUID AND SWELLING 60 tablet 3  . gabapentin (NEURONTIN) 600 MG tablet Take 600 mg by mouth 4 (four) times daily. For pain or cramps    . HYDROcodone-acetaminophen (NORCO) 7.5-325 MG per tablet Take 1 tablet by mouth every 6 (six) hours as needed for moderate pain. 30 tablet 0  . hyoscyamine (LEVSIN SL) 0.125 MG SL tablet Place 1 tablet (0.125 mg  total) under the tongue every 6 (six) hours as needed. (Patient taking differently: Place 0.125 mg under the tongue every 6 (six) hours as needed for cramping. ) 30 tablet 4  . KLOR-CON 10 10 MEQ tablet Take 10 mEq by mouth 2 (two) times daily.  3  . levothyroxine (SYNTHROID, LEVOTHROID) 100 MCG tablet Take 100 mcg by mouth daily before breakfast.     . Magnesium Hydroxide 400 MG CHEW Chew 3 tablets by mouth daily.    Marland Kitchen morphine (MS CONTIN) 15 MG 12 hr tablet Take 1 tablet (15 mg total) by mouth every 12 (twelve) hours. 60 tablet 0  . Multiple Vitamin (MULITIVITAMIN WITH MINERALS) TABS Take 1 tablet by mouth every morning.     . ondansetron (ZOFRAN-ODT) 8 MG disintegrating tablet TAKE 1 TABLET (8 MG TOTAL) BY MOUTH EVERY 8 (EIGHT) HOURS AS NEEDED FOR NAUSEA OR VOMITING. (Patient not taking: Reported on 01/16/2015) 30 tablet 1  .  pantoprazole (PROTONIX) 40 MG tablet TAKE 1 TABLET (40 MG TOTAL) BY MOUTH 2 (TWO) TIMES DAILY. 60 tablet 5  . pomalidomide (POMALYST) 4 MG capsule Take 1 capsule (4 mg total) by mouth daily. Take with water on days 1-21. Repeat every 28 days. 21 capsule 0  . promethazine-dextromethorphan (PROMETHAZINE-DM) 6.25-15 MG/5ML syrup TAKE 5MLS BY MOUTH 4 TIMES A DAY AS NEEDED FOR COUGH 180 mL 0   No current facility-administered medications for this visit.    SURGICAL HISTORY:  Past Surgical History  Procedure Laterality Date  . Knee arthroscopy Right     "put pin in"  . Knee arthroscopy Right     "took pin out and corrected what was wrong"  . Video bronchoscopy  07/30/2011    Procedure: VIDEO BRONCHOSCOPY WITHOUT FLUORO;  Surgeon: Kathee Delton, MD;  Location: [facility];  Service: Cardiopulmonary;  Laterality: Bilateral;  . Cholecystectomy  1980's  . Portacath placement Right 2009  . Vaginal hysterectomy  1980's    REVIEW OF SYSTEMS:  Constitutional: positive for fatigue Eyes: negative Ears, nose, mouth, throat, and face: negative Respiratory:  negative Cardiovascular: negative Gastrointestinal: positive for diarrhea Genitourinary:negative Integument/breast: negative Hematologic/lymphatic: negative Musculoskeletal:positive for back pain Neurological: negative Behavioral/Psych: negative   PHYSICAL EXAMINATION: General appearance: alert, cooperative, fatigued and no distress Head: Normocephalic, without obvious abnormality, atraumatic Neck: no adenopathy, no JVD, supple, symmetrical, trachea midline and thyroid not enlarged, symmetric, no tenderness/mass/nodules Lymph nodes: Cervical, supraclavicular, and axillary nodes normal. Resp: clear to auscultation bilaterally Back: symmetric, no curvature. ROM normal. No CVA tenderness. Cardio: regular rate and rhythm, S1, S2 normal, no murmur, click, rub or gallop GI: soft, non-tender; bowel sounds normal; no masses,  no organomegaly Extremities: edema 1+ edema of left lower extremity. Neurologic: Alert and oriented X 3, normal strength and tone. Normal symmetric reflexes. Normal coordination and gait  ECOG PERFORMANCE STATUS: 1 - Symptomatic but completely ambulatory  Blood pressure 116/59, pulse 18, temperature 98.2 F (36.8 C), temperature source Oral, resp. rate 18, height 5' 4.75" (1.645 m), weight 195 lb 8 oz (88.678 kg), SpO2 99 %.  LABORATORY DATA: Lab Results  Component Value Date   WBC 5.7 02/14/2015   HGB 10.4* 02/14/2015   HCT 32.2* 02/14/2015   MCV 97.1 02/14/2015   PLT 281 02/14/2015      Chemistry      Component Value Date/Time   NA 140 02/14/2015 0843   NA 138 01/13/2015 1559   K 4.1 02/14/2015 0843   K 3.2* 01/13/2015 1559   CL 109 01/13/2015 1559   CL 105 12/17/2012 1015   CO2 24 02/14/2015 0843   CO2 25 01/13/2015 1559   BUN 15.2 02/14/2015 0843   BUN 7 01/13/2015 1559   CREATININE 0.9 02/14/2015 0843   CREATININE 0.71 01/13/2015 1559   CREATININE 0.73 12/12/2014 1158      Component Value Date/Time   CALCIUM 9.4 02/14/2015 0843   CALCIUM 7.9*  01/13/2015 1559   ALKPHOS 56 02/14/2015 0843   ALKPHOS 51 01/13/2015 1559   AST 7 02/14/2015 0843   AST 11* 01/13/2015 1559   ALT 12 02/14/2015 0843   ALT 13* 01/13/2015 1559   BILITOT 0.69 02/14/2015 0843   BILITOT 1.2 01/13/2015 1559     Other lab results: Beta-2 microglobulin 2.25, free kappa light chain 3.18, free lambda light chain 9.64 and a kappa/lambda ratio of 0.33. IgG 967, IgA 171 and IgM 28.  RADIOGRAPHIC STUDIES:  ASSESSMENT AND PLAN:  This is a very pleasant  64 years old [place] American female with recurrent multiple myeloma recently completed a course of treatment with Velcade and Decadron with improvement in her disease but this was discontinued secondary to peripheral neuropathy. The patient tolerated her treatment with Carfilzomib and Decadron fairly well except for the recent shortness breath and cough which was felt to be secondary to congestive heart failure from her treatment with Carfilzomib. This treatment was discontinued. She was started on treatment with Pomalyst and Decadron status post 14 cycles. She tolerated the last cycle of her treatment well. Patient was discussed with and also seen by Dr. Julien Nordmann. Results available from her recent myeloma panel showed no significant disease progression. She will continue Imodium and Lomotil as prescribed for her diarrhea as needed. Recommended for the patient to proceed with cycle #15 as a scheduled. She will come back for follow-up visit in one month's for reevaluation. She will return for a followup visit in 4 weeks for evaluation after repeating CBC, comprehensive metabolic panel and LDH. The patient will continue her current treatment with Zometa every 3 months as scheduled.  She was advised to call immediately if she has any concerning symptoms in the interval. The patient voices understanding of current disease status and treatment options and is in agreement with the current care plan.  All questions were answered.  The patient knows to call the clinic with any problems, questions or concerns. We can certainly see the patient much sooner if necessary.  Carlton Adam, [name] 02/16/2015   ADDENDUM: Hematology/Oncology Attending: I had a face to face encounter with the patient. I recommended her care plan. This is a very pleasant 64 years old African-American female with relapsed multiple myeloma who is currently undergoing treatment with Olin Hauser is tanned Decadron status post 14 cycles. The patient is tolerating her treatment well with no significant adverse effects. The recent myeloma panel showed no evidence for disease progression. I discussed the lab result with the patient and her daughter. I recommended for her to continue her current treatment with, last and Decadron as scheduled. She is expected to start cycle #15 and few days. For diarrhea and the patient was advised to continue her treatment with Imodium and Lomotil as needed. For the bone disease, she will continue on Zometa every 3 months. The patient would come back for follow-up visit in one month's for reevaluation after repeating CBC, comprehensive metabolic panel and LDH. She was advised to call immediately if she has any concerning symptoms in the interval.  Disclaimer: This note was dictated with voice recognition software. Similar sounding words can inadvertently be transcribed and may not be corrected upon review. Eilleen Kempf., MD 02/22/2015

## 2015-02-22 NOTE — Patient Instructions (Signed)
Your available protein study results are stable Continue Pomalyst and Decadron as prescribed Follow up in one month

## 2015-02-23 ENCOUNTER — Other Ambulatory Visit: Payer: Self-pay | Admitting: Internal Medicine

## 2015-02-28 ENCOUNTER — Other Ambulatory Visit: Payer: Self-pay | Admitting: Medical Oncology

## 2015-02-28 ENCOUNTER — Other Ambulatory Visit: Payer: Self-pay | Admitting: Physician Assistant

## 2015-02-28 DIAGNOSIS — C9000 Multiple myeloma not having achieved remission: Secondary | ICD-10-CM

## 2015-02-28 MED ORDER — POMALIDOMIDE 4 MG PO CAPS: 4.0000 mg | ORAL_CAPSULE | Freq: Every day | ORAL | Status: DC

## 2015-03-01 NOTE — Progress Notes (Signed)
[facility] faxed confirmation of facsimile receipt for Pomalyst prescription referral.  [facility] will verify insurance and make delivery arrangements with patient.

## 2015-03-10 ENCOUNTER — Other Ambulatory Visit: Payer: Self-pay | Admitting: Internal Medicine

## 2015-03-11 ENCOUNTER — Other Ambulatory Visit: Payer: Self-pay | Admitting: Physician Assistant

## 2015-03-14 ENCOUNTER — Ambulatory Visit (INDEPENDENT_AMBULATORY_CARE_PROVIDER_SITE_OTHER): Payer: Medicare Other | Admitting: Internal Medicine

## 2015-03-14 ENCOUNTER — Encounter: Payer: Self-pay | Admitting: Internal Medicine

## 2015-03-14 VITALS — BP 106/60 | HR 82 | Temp 98.2°F | Resp 18 | Ht 65.0 in | Wt 194.0 lb

## 2015-03-14 DIAGNOSIS — R6889 Other general symptoms and signs: Secondary | ICD-10-CM | POA: Diagnosis not present

## 2015-03-14 DIAGNOSIS — E782 Mixed hyperlipidemia: Secondary | ICD-10-CM | POA: Diagnosis not present

## 2015-03-14 DIAGNOSIS — R296 Repeated falls: Secondary | ICD-10-CM | POA: Diagnosis not present

## 2015-03-14 DIAGNOSIS — E1129 Type 2 diabetes mellitus with other diabetic kidney complication: Secondary | ICD-10-CM | POA: Diagnosis not present

## 2015-03-14 DIAGNOSIS — E1122 Type 2 diabetes mellitus with diabetic chronic kidney disease: Secondary | ICD-10-CM

## 2015-03-14 DIAGNOSIS — E039 Hypothyroidism, unspecified: Secondary | ICD-10-CM | POA: Diagnosis not present

## 2015-03-14 DIAGNOSIS — C9000 Multiple myeloma not having achieved remission: Secondary | ICD-10-CM | POA: Diagnosis not present

## 2015-03-14 DIAGNOSIS — F32A Depression, unspecified: Secondary | ICD-10-CM

## 2015-03-14 DIAGNOSIS — I10 Essential (primary) hypertension: Secondary | ICD-10-CM | POA: Diagnosis not present

## 2015-03-14 DIAGNOSIS — N183 Chronic kidney disease, stage 3 (moderate): Secondary | ICD-10-CM

## 2015-03-14 DIAGNOSIS — Z79899 Other long term (current) drug therapy: Secondary | ICD-10-CM

## 2015-03-14 DIAGNOSIS — F329 Major depressive disorder, single episode, unspecified: Secondary | ICD-10-CM

## 2015-03-14 DIAGNOSIS — K2100 Gastro-esophageal reflux disease with esophagitis, without bleeding: Secondary | ICD-10-CM

## 2015-03-14 DIAGNOSIS — Z9181 History of falling: Secondary | ICD-10-CM

## 2015-03-14 DIAGNOSIS — E559 Vitamin D deficiency, unspecified: Secondary | ICD-10-CM | POA: Diagnosis not present

## 2015-03-14 DIAGNOSIS — Z0001 Encounter for general adult medical examination with abnormal findings: Secondary | ICD-10-CM | POA: Diagnosis not present

## 2015-03-14 DIAGNOSIS — Z0000 Encounter for general adult medical examination without abnormal findings: Secondary | ICD-10-CM | POA: Diagnosis not present

## 2015-03-14 DIAGNOSIS — Z1212 Encounter for screening for malignant neoplasm of rectum: Secondary | ICD-10-CM

## 2015-03-14 LAB — IRON AND TIBC
%SAT: 14 % (ref 11–50)
Iron: 47 ug/dL (ref 45–160)
TIBC: 338 ug/dL (ref 250–450)
UIBC: 291 ug/dL (ref 125–400)

## 2015-03-14 LAB — CBC WITH DIFFERENTIAL/PLATELET
Basophils Absolute: 0 10*3/uL (ref 0.0–0.1)
Basophils Relative: 1 % (ref 0–1)
EOS ABS: 0.1 10*3/uL (ref 0.0–0.7)
Eosinophils Relative: 3 % (ref 0–5)
HCT: 34.4 % — ABNORMAL LOW (ref 36.0–46.0)
HEMOGLOBIN: 11.1 g/dL — AB (ref 12.0–15.0)
Lymphocytes Relative: 15 % (ref 12–46)
Lymphs Abs: 0.5 10*3/uL — ABNORMAL LOW (ref 0.7–4.0)
MCH: 31.1 pg (ref 26.0–34.0)
MCHC: 32.3 g/dL (ref 30.0–36.0)
MCV: 96.4 fL (ref 78.0–100.0)
MPV: 9.5 fL (ref 8.6–12.4)
Monocytes Absolute: 0.4 10*3/uL (ref 0.1–1.0)
Monocytes Relative: 14 % — ABNORMAL HIGH (ref 3–12)
NEUTROS ABS: 2.1 10*3/uL (ref 1.7–7.7)
NEUTROS PCT: 67 % (ref 43–77)
Platelets: 256 10*3/uL (ref 150–400)
RBC: 3.57 MIL/uL — AB (ref 3.87–5.11)
RDW: 17.3 % — ABNORMAL HIGH (ref 11.5–15.5)
WBC: 3.2 10*3/uL — AB (ref 4.0–10.5)

## 2015-03-14 LAB — BASIC METABOLIC PANEL WITH GFR
BUN: 12 mg/dL (ref 7–25)
CO2: 25 mmol/L (ref 20–31)
CREATININE: 0.68 mg/dL (ref 0.50–0.99)
Calcium: 8 mg/dL — ABNORMAL LOW (ref 8.6–10.4)
Chloride: 106 mmol/L (ref 98–110)
Glucose, Bld: 87 mg/dL (ref 65–99)
Potassium: 3.8 mmol/L (ref 3.5–5.3)
SODIUM: 141 mmol/L (ref 135–146)

## 2015-03-14 LAB — LIPID PANEL
Cholesterol: 154 mg/dL (ref 125–200)
HDL: 43 mg/dL — ABNORMAL LOW (ref >=46)
LDL CALC: 62 mg/dL (ref <130)
Total CHOL/HDL Ratio: 3.6 Ratio (ref <=5.0)
Triglycerides: 244 mg/dL — ABNORMAL HIGH (ref <150)
VLDL: 49 mg/dL — AB (ref <30)

## 2015-03-14 LAB — VITAMIN B12: Vitamin B-12: 558 pg/mL (ref 211–911)

## 2015-03-14 LAB — HEPATIC FUNCTION PANEL
ALT: 11 U/L (ref 6–29)
AST: 10 U/L (ref 10–35)
Albumin: 3.5 g/dL — ABNORMAL LOW (ref 3.6–5.1)
Alkaline Phosphatase: 74 U/L (ref 33–130)
BILIRUBIN DIRECT: 0.2 mg/dL (ref <=0.2)
BILIRUBIN INDIRECT: 0.8 mg/dL (ref 0.2–1.2)
Total Bilirubin: 1 mg/dL (ref 0.2–1.2)
Total Protein: 5.9 g/dL — ABNORMAL LOW (ref 6.1–8.1)

## 2015-03-14 LAB — TSH: TSH: 0.844 u[IU]/mL (ref 0.350–4.500)

## 2015-03-14 LAB — MAGNESIUM: Magnesium: 1.7 mg/dL (ref 1.5–2.5)

## 2015-03-14 MED ORDER — DIAZEPAM 10 MG PO TABS: 10.0000 mg | ORAL_TABLET | Freq: Four times a day (QID) | ORAL | Status: DC | PRN

## 2015-03-14 MED ORDER — CITALOPRAM HYDROBROMIDE 40 MG PO TABS: 40.0000 mg | ORAL_TABLET | Freq: Every day | ORAL | Status: DC

## 2015-03-14 NOTE — Progress Notes (Signed)
Patient ID: [name], female   DOB: 03-Nov-1950, 64 y.o.   MRN: [id number]   [facility] VISIT AND CPE  Assessment:    1. Encounter for general adult medical examination with abnormal findings  - CBC with Differential/Platelet - BASIC METABOLIC PANEL WITH GFR - Hepatic function panel - Magnesium - Lipid panel - Hemoglobin A1c - Insulin, random - Iron and TIBC - Vitamin B12 - Urinalysis, Routine w reflex microscopic (not at [facility]) - Microalbumin / creatinine urine ratio - EKG 12-Lead - Vit D  25 hydroxy (rtn osteoporosis monitoring) - TSH  2. Essential hypertension  - Urinalysis, Routine w reflex microscopic (not at [facility]) - Microalbumin / creatinine urine ratio - EKG 12-Lead - TSH  3. Hypothyroidism, unspecified hypothyroidism type -cont levothyroxine  4. CKD stage 3 due to type 2 diabetes mellitus  - Hemoglobin A1c - Insulin, random  5. Hyperlipidemia -cont meds - Lipid panel  6. Vitamin D deficiency -cont suppementation - Vit D  25 hydroxy (rtn osteoporosis monitoring)  7. Multiple myeloma -currently followed by oncology -currently undergoing chemotherapy - Iron and TIBC - Vitamin B12  8. Gastroesophageal reflux disease with esophagitis -cont meds  9. Medication management  - CBC with Differential/Platelet - BASIC METABOLIC PANEL WITH GFR - Hepatic function panel - Magnesium  10.  Depression, uncontrolled -increase celexa to 40 mg daily  Vaccinations to be given by Dr. Julien Nordmann  Changed xanax in the evening for valium for greater muscle relaxant relief and also for help with sleeping.  Patient to let me know whether this is working for her.  Told her to d/c xanax use after 6 pm which she and her daughter stated understanding for   Over 40 minutes of exam, counseling, chart review and critical decision making was performed  Plan:   During the course of the visit the patient was educated and counseled about appropriate  screening and preventive services including:    Pneumococcal vaccine   Prevnar 13  Influenza vaccine  Td vaccine  Screening electrocardiogram  Bone densitometry screening  Colorectal cancer screening  Diabetes screening  Glaucoma screening  Nutrition counseling   Advanced directives: requested  Conditions/risks identified: BMI: Discussed weight loss, diet, and increase physical activity.  Increase physical activity: AHA recommends 150 minutes of physical activity a week.  Medications reviewed Diabetes is at goal, ACE/ARB therapy: Yes. Urinary Incontinence is an issue: discussed non pharmacology and pharmacology options.  Fall risk: moderate- discussed PT, home fall assessment, medications.    Subjective:  [name] is a 64 y.o. female who presents for Medicare Annual Wellness Visit and complete physical. This is a welcome to medicare visit.  She has a history of multiple myeloma which is currently being treated with chemotherapy by Dr. Julien Nordmann.  She has had elevated blood pressure for many years. Her blood pressure has been controlled at home, today their BP is BP: 106/60 mmHg She does workout secondary to her cancer and chemotherapy. She denies chest pain, shortness of breath, dizziness.   She is on cholesterol medication and denies myalgias. Her cholesterol is at goal. The cholesterol last visit was:   Lab Results  Component Value Date   CHOL 179 12/12/2014   HDL 54 12/12/2014   LDLCALC 73 12/12/2014   TRIG 262* 12/12/2014   CHOLHDL 3.3 12/12/2014   She has had diabetes with chronic kidney disease which is controlled with diet and exercise. She has been working on diet and exercise for diabetes, and denies  foot ulcerations, hyperglycemia, hypoglycemia , visual disturbances and weight loss. Last A1C in the office was:  Lab Results  Component Value Date   HGBA1C 5.9* 12/12/2014   Patient is on Vitamin D supplement.   Lab Results  Component Value Date    VD25OH 31 12/12/2014      Medication Review: Current Outpatient Prescriptions on File Prior to Visit  Medication Sig Dispense Refill  . ALPRAZolam (XANAX) 0.5 MG tablet Take 1/2 to 1 tablet 2 to 3 x daily as needed for nerves (Patient taking differently: Take 0.5 mg by mouth 3 (three) times daily as needed. ) 90 tablet 5  . aspirin 81 MG tablet Take 81 mg by mouth daily.    . citalopram (CELEXA) 20 MG tablet TAKE 1 TABLET (20 MG TOTAL) BY MOUTH DAILY. 90 tablet 1  . cyclobenzaprine (FLEXERIL) 10 MG tablet TAKE 1/2 TO 1 TABLET BY MOUTH THREE TIMES DAILY FOR MUSCLE SPASMS 90 tablet 2  . dexamethasone (DECADRON) 4 MG tablet TAKE 10 TABLETS BY MOUTH EVERY FRIDAY 40 tablet 1  . diphenoxylate-atropine (LOMOTIL) 2.5-0.025 MG per tablet Take 2 tablets by mouth 4 (four) times daily as needed for diarrhea or loose stools. 30 tablet 1  . Eszopiclone 3 MG TABS Take 1 tablet (3 mg total) by mouth at bedtime as needed (sleep). Take immediately before bedtime 30 tablet 5  . fexofenadine (ALLEGRA) 60 MG tablet Take 1 tablet daily as needed for allergies 90 tablet 1  . furosemide (LASIX) 40 MG tablet TAKE 1 TABLET BY MOUTH TWICE A DAY FOR FLUID AND SWELLING 60 tablet 3  . gabapentin (NEURONTIN) 600 MG tablet Take 600 mg by mouth 4 (four) times daily. For pain or cramps    . HYDROcodone-acetaminophen (NORCO) 7.5-325 MG per tablet Take 1 tablet by mouth every 6 (six) hours as needed for moderate pain. 30 tablet 0  . hyoscyamine (LEVSIN SL) 0.125 MG SL tablet Place 1 tablet (0.125 mg total) under the tongue every 6 (six) hours as needed. (Patient taking differently: Place 0.125 mg under the tongue every 6 (six) hours as needed for cramping. ) 30 tablet 4  . KLOR-CON 10 10 MEQ tablet Take 10 mEq by mouth 2 (two) times daily.  3  . levothyroxine (SYNTHROID, LEVOTHROID) 100 MCG tablet Take 100 mcg by mouth daily before breakfast.     . Magnesium Hydroxide 400 MG CHEW Chew 3 tablets by mouth daily.    Marland Kitchen morphine (MS  CONTIN) 15 MG 12 hr tablet Take 1 tablet (15 mg total) by mouth every 12 (twelve) hours. 60 tablet 0  . Multiple Vitamin (MULITIVITAMIN WITH MINERALS) TABS Take 1 tablet by mouth every morning.     . ondansetron (ZOFRAN-ODT) 8 MG disintegrating tablet TAKE 1 TABLET BY MOUTH EVERY 8 HOURS AS NEEDED FOR NAUSEA AND VOMITING 30 tablet 1  . pantoprazole (PROTONIX) 40 MG tablet TAKE 1 TABLET (40 MG TOTAL) BY MOUTH 2 (TWO) TIMES DAILY. 60 tablet 5  . pomalidomide (POMALYST) 4 MG capsule Take 1 capsule (4 mg total) by mouth daily. Take with water on days 1-21. Repeat every 28 days. 21 capsule 0  . promethazine-dextromethorphan (PROMETHAZINE-DM) 6.25-15 MG/5ML syrup TAKE 5MLS BY MOUTH 4 TIMES A DAY AS NEEDED FOR COUGH 180 mL 0   No current facility-administered medications on file prior to visit.    Current Problems (verified) Patient Active Problem List   Diagnosis Date Noted  . Encounter for antineoplastic chemotherapy 01/16/2015  . Pain and swelling of  left lower extremity 12/15/2014  . Medication management 11/04/2013  . Vitamin D deficiency 11/04/2013  . CKD stage 3 due to type 2 diabetes mellitus 11/04/2013  . Hyperlipidemia 02/24/2013  . History of autologous stem cell transplant   . Multiple myeloma 01/12/2010  . Hypothyroidism 01/12/2010  . GERD 01/12/2010  . Essential hypertension 01/12/2010    Screening Tests Immunization History  Administered Date(s) Administered  . Influenza,inj,Quad PF,36+ Mos 04/01/2014  . Influenza-Unspecified 03/31/2014  . Pneumococcal-Unspecified 07/01/2008  . Tdap 07/01/2008    Preventative care: Last colonoscopy: 2013 Last mammogram: 5/24/1 [id number] Results for orders placed during the hospital encounter of 11/12/12  MM Digital Screening   Narrative *RADIOLOGY REPORT*  Clinical Data: Screening.  DIGITAL BILATERAL SCREENING MAMMOGRAM WITH CAD  Comparison:  Previous exams.  FINDINGS:  ACR Breast Density Category 2: There is a scattered  fibroglandular pattern.  No suspicious masses, architectural distortion, or calcifications are present.  Images were processed with CAD.  IMPRESSION: No mammographic evidence of malignancy.  A result letter of this screening mammogram will be mailed directly to the patient.  RECOMMENDATION: Screening mammogram in one year. (Code:SM-B-01Y)  BI-RADS CATEGORY 1:  Negative.   Original Report Authenticated By: Altamese Cabal, M.D.     Names of Other Physician/Practitioners you currently use: 1. [facility] here for primary care 2. Dr. Venetia Maxon, eye doctor, last visit 2016 3. Dr. Burnett Harry, dentist, last visit 2016 Patient Care Team: Unk Pinto, MD as PCP - General (Internal Medicine) Curt Bears, MD as Consulting Physician (Oncology) Lelon Perla, MD as Consulting Physician (Cardiology) Wonda Horner, MD as Consulting Physician (Gastroenterology) Marybelle Killings, MD as Consulting Physician (Orthopedic Surgery) Kyung Rudd, MD as Consulting Physician (Radiation Oncology) Arnoldo Hooker (Hematology and Oncology) Laurence Spates, MD as Consulting Physician (Gastroenterology) Marybelle Killings, MD as Consulting Physician (Orthopedic Surgery) Alda Berthold, DO as Consulting Physician (Neurology) Diane Dalphine Handing (Optometry) Milus Banister, MD as Attending Physician (Gastroenterology)  SURGICAL HISTORY Past Surgical History  Procedure Laterality Date  . Knee arthroscopy Right     "put pin in"  . Knee arthroscopy Right     "took pin out and corrected what was wrong"  . Video bronchoscopy  07/30/2011    Procedure: VIDEO BRONCHOSCOPY WITHOUT FLUORO;  Surgeon: Kathee Delton, MD;  Location: [facility];  Service: Cardiopulmonary;  Laterality: Bilateral;  . Cholecystectomy  1980's  . Portacath placement Right 2009  . Vaginal hysterectomy  1980's   FAMILY HISTORY Family History  Problem Relation Age of Onset  . Lung cancer Brother    . Colon cancer Maternal Uncle   . Breast cancer Maternal Grandmother    SOCIAL HISTORY Social History  Substance Use Topics  . Smoking status: Former Smoker -- 0.25 packs/day for 6 years    Types: Cigarettes    Quit date: 08/27/1978  . Smokeless tobacco: Never Used  . Alcohol Use: No     Comment: "quit drinking in the 1980's", previously drank on the weekend    MEDICARE WELLNESS OBJECTIVES: Tobacco use: She does not smoke.  Patient is a former smoker. If yes, counseling given Alcohol Current alcohol use: none Osteoporosis: postmenopausal estrogen deficiency, History of fracture in the past year: no Fall risk: Moderate Risk Hearing: normal Visual acuity: normal,  does not perform annual eye exam Diet: in general, a "healthy" diet   Physical activity:   Cardiac risk factors:   Depression/mood screen:   Depression screen [facility] 2/9 08/14/2014  Decreased Interest 0  Down, Depressed, Hopeless 0  PHQ - 2 Score 0    ADLs:  No flowsheet data found.   Cognitive Testing  Alert? Yes  Normal Appearance?Yes  Oriented to person? Yes  Place? Yes   Time? Yes  Recall of three objects?  Yes  Can perform simple calculations? Yes  Displays appropriate judgment?Yes  Can read the correct time from a watch face?Yes  EOL planning:    ROS   Objective:     Today's Vitals   03/14/15 1018  BP: 106/60  Pulse: 82  Temp: 98.2 F (36.8 C)  TempSrc: Temporal  Resp: 18  Height: 5' 5"  (1.651 m)  Weight: 194 lb (87.998 kg)   Body mass index is 32.28 kg/(m^2).  General appearance: alert, no distress, WD/WN, female HEENT: normocephalic, sclerae anicteric, TMs pearly, nares patent, no discharge or erythema, pharynx normal Oral cavity: MMM, no lesions Neck: supple, no lymphadenopathy, no thyromegaly, no masses Heart: RRR, normal S1, S2, no murmurs Lungs: CTA bilaterally, no wheezes, rhonchi, or rales Breasts:  Symmetric bilaterally without retractions, skin changes or masses.   Abdomen:  +bs, well healed midline surgical incision without hernias soft, non tender, non distended, no masses, no hepatomegaly, no splenomegaly Musculoskeletal: nontender, no swelling, no obvious deformity.  Well healed surgical incisions on the knees Extremities: no edema, no cyanosis, no clubbing Pulses: 2+ symmetric, upper and lower extremities, normal cap refill Neurological: alert, oriented x 3, CN2-12 intact, strength normal upper extremities and lower extremities, sensation normal throughout, DTRs 2+ throughout, no cerebellar signs, gait normal Psychiatric: withdrawn affect, behavior normal, pleasant   Medicare Attestation I have personally reviewed: The patient's medical and social history Their use of alcohol, tobacco or illicit drugs Their current medications and supplements The patient's functional ability including ADLs,fall risks, home safety risks, cognitive, and hearing and visual impairment Diet and physical activities Evidence for depression or mood disorders  The patient's weight, height, BMI, and visual acuity have been recorded in the chart.  I have made referrals, counseling, and provided education to the patient based on review of the above and I have provided the patient with a written personalized care plan for preventive services.     Starlyn Skeans, [name]   03/14/2015

## 2015-03-14 NOTE — Patient Instructions (Signed)
Please start taking valium at night time approximately 30 minutes prior to bedtime.  This should help relax your muscles and help keep you asleep.  Please continue to take either claritin, zyrtec, or allegra daily to help with your mucous production.     Preventive Care for Adults  A healthy lifestyle and preventive care can promote health and wellness. Preventive health guidelines for women include the following key practices.  A routine yearly physical is a good way to check with your health care provider about your health and preventive screening. It is a chance to share any concerns and updates on your health and to receive a thorough exam.  Visit your dentist for a routine exam and preventive care every 6 months. Brush your teeth twice a day and floss once a day. Good oral hygiene prevents tooth decay and gum disease.  The frequency of eye exams is based on your age, health, family medical history, use of contact lenses, and other factors. Follow your health care provider's recommendations for frequency of eye exams.  Eat a healthy diet. Foods like vegetables, fruits, whole grains, low-fat dairy products, and lean protein foods contain the nutrients you need without too many calories. Decrease your intake of foods high in solid fats, added sugars, and salt. Eat the right amount of calories for you.Get information about a proper diet from your health care provider, if necessary.  Regular physical exercise is one of the most important things you can do for your health. Most adults should get at least 150 minutes of moderate-intensity exercise (any activity that increases your heart rate and causes you to sweat) each week. In addition, most adults need muscle-strengthening exercises on 2 or more days a week.  Maintain a healthy weight. The body mass index (BMI) is a screening tool to identify possible weight problems. It provides an estimate of body fat based on height and weight. Your health  care provider can find your BMI and can help you achieve or maintain a healthy weight.For adults 20 years and older:  A BMI below 18.5 is considered underweight.  A BMI of 18.5 to 24.9 is normal.  A BMI of 25 to 29.9 is considered overweight.  A BMI of 30 and above is considered obese.  Maintain normal blood lipids and cholesterol levels by exercising and minimizing your intake of saturated fat. Eat a balanced diet with plenty of fruit and vegetables. Blood tests for lipids and cholesterol should begin at age 14 and be repeated every 5 years. If your lipid or cholesterol levels are high, you are over 50, or you are at high risk for heart disease, you may need your cholesterol levels checked more frequently.Ongoing high lipid and cholesterol levels should be treated with medicines if diet and exercise are not working.  If you smoke, find out from your health care provider how to quit. If you do not use tobacco, do not start.  Lung cancer screening is recommended for adults aged 66-80 years who are at high risk for developing lung cancer because of a history of smoking. A yearly low-dose CT scan of the lungs is recommended for people who have at least a 30-pack-year history of smoking and are a current smoker or have quit within the past 15 years. A pack year of smoking is smoking an average of 1 pack of cigarettes a day for 1 year (for example: 1 pack a day for 30 years or 2 packs a day for 15 years). Yearly screening  should continue until the smoker has stopped smoking for at least 15 years. Yearly screening should be stopped for people who develop a health problem that would prevent them from having lung cancer treatment.  High blood pressure causes heart disease and increases the risk of stroke. Your blood pressure should be checked at least every 1 to 2 years. Ongoing high blood pressure should be treated with medicines if weight loss and exercise do not work.  If you are 68-30 years old, ask  your health care provider if you should take aspirin to prevent strokes.  Diabetes screening involves taking a blood sample to check your fasting blood sugar level. This should be done once every 3 years, after age 36, if you are within normal weight and without risk factors for diabetes. Testing should be considered at a younger age or be carried out more frequently if you are overweight and have at least 1 risk factor for diabetes.  Breast cancer screening is essential preventive care for women. You should practice "breast self-awareness." This means understanding the normal appearance and feel of your breasts and may include breast self-examination. Any changes detected, no matter how small, should be reported to a health care provider. Women in their 22s and 30s should have a clinical breast exam (CBE) by a health care provider as part of a regular health exam every 1 to 3 years. After age 31, women should have a CBE every year. Starting at age 82, women should consider having a mammogram (breast X-ray test) every year. Women who have a family history of breast cancer should talk to their health care provider about genetic screening. Women at a high risk of breast cancer should talk to their health care providers about having an MRI and a mammogram every year.  Breast cancer gene (BRCA)-related cancer risk assessment is recommended for women who have family members with BRCA-related cancers. BRCA-related cancers include breast, ovarian, tubal, and peritoneal cancers. Having family members with these cancers may be associated with an increased risk for harmful changes (mutations) in the breast cancer genes BRCA1 and BRCA2. Results of the assessment will determine the need for genetic counseling and BRCA1 and BRCA2 testing.  Routine pelvic exams to screen for cancer are no longer recommended for nonpregnant women who are considered low risk for cancer of the pelvic organs (ovaries, uterus, and vagina) and  who do not have symptoms. Ask your health care provider if a screening pelvic exam is right for you.  If you have had past treatment for cervical cancer or a condition that could lead to cancer, you need Pap tests and screening for cancer for at least 20 years after your treatment. If Pap tests have been discontinued, your risk factors (such as having a new sexual partner) need to be reassessed to determine if screening should be resumed. Some women have medical problems that increase the chance of getting cervical cancer. In these cases, your health care provider may recommend more frequent screening and Pap tests.  Colorectal cancer can be detected and often prevented. Most routine colorectal cancer screening begins at the age of 57 years and continues through age 37 years. However, your health care provider may recommend screening at an earlier age if you have risk factors for colon cancer. On a yearly basis, your health care provider may provide home test kits to check for hidden blood in the stool. Use of a small camera at the end of a tube, to directly examine the colon (  sigmoidoscopy or colonoscopy), can detect the earliest forms of colorectal cancer. Talk to your health care provider about this at age 16, when routine screening begins. Direct exam of the colon should be repeated every 5-10 years through age 53 years, unless early forms of pre-cancerous polyps or small growths are found.  Hepatitis C blood testing is recommended for all people born from [birth year] through 1965 and any individual with known risks for hepatitis C.  Pra  Osteoporosis is a disease in which the bones lose minerals and strength with aging. This can result in serious bone fractures or breaks. The risk of osteoporosis can be identified using a bone density scan. Women ages 84 years and over and women at risk for fractures or osteoporosis should discuss screening with their health care providers. Ask your health care provider  whether you should take a calcium supplement or vitamin D to reduce the rate of osteoporosis.  Menopause can be associated with physical symptoms and risks. Hormone replacement therapy is available to decrease symptoms and risks. You should talk to your health care provider about whether hormone replacement therapy is right for you.  Use sunscreen. Apply sunscreen liberally and repeatedly throughout the day. You should seek shade when your shadow is shorter than you. Protect yourself by wearing long sleeves, pants, a wide-brimmed hat, and sunglasses year round, whenever you are outdoors.  Once a month, do a whole body skin exam, using a mirror to look at the skin on your back. Tell your health care provider of new moles, moles that have irregular borders, moles that are larger than a pencil eraser, or moles that have changed in shape or color.  Stay current with required vaccines (immunizations).  Influenza vaccine. All adults should be immunized every year.  Tetanus, diphtheria, and acellular pertussis (Td, Tdap) vaccine. Pregnant women should receive 1 dose of Tdap vaccine during each pregnancy. The dose should be obtained regardless of the length of time since the last dose. Immunization is preferred during the 27th-36th week of gestation. An adult who has not previously received Tdap or who does not know her vaccine status should receive 1 dose of Tdap. This initial dose should be followed by tetanus and diphtheria toxoids (Td) booster doses every 10 years. Adults with an unknown or incomplete history of completing a 3-dose immunization series with Td-containing vaccines should begin or complete a primary immunization series including a Tdap dose. Adults should receive a Td booster every 10 years.  Varicella vaccine. An adult without evidence of immunity to varicella should receive 2 doses or a second dose if she has previously received 1 dose. Pregnant females who do not have evidence of immunity  should receive the first dose after pregnancy. This first dose should be obtained before leaving the health care facility. The second dose should be obtained 4-8 weeks after the first dose.  Human papillomavirus (HPV) vaccine. Females aged 13-26 years who have not received the vaccine previously should obtain the 3-dose series. The vaccine is not recommended for use in pregnant females. However, pregnancy testing is not needed before receiving a dose. If a female is found to be pregnant after receiving a dose, no treatment is needed. In that case, the remaining doses should be delayed until after the pregnancy. Immunization is recommended for any person with an immunocompromised condition through the age of 70 years if she did not get any or all doses earlier. During the 3-dose series, the second dose should be obtained 4-8 weeks after  the first dose. The third dose should be obtained 24 weeks after the first dose and 16 weeks after the second dose.  Zoster vaccine. One dose is recommended for adults aged 11 years or older unless certain conditions are present.  Measles, mumps, and rubella (MMR) vaccine. Adults born before [birth year] generally are considered immune to measles and mumps. Adults born in [birth year] or later should have 1 or more doses of MMR vaccine unless there is a contraindication to the vaccine or there is laboratory evidence of immunity to each of the three diseases. A routine second dose of MMR vaccine should be obtained at least 28 days after the first dose for students attending postsecondary schools, health care workers, or international travelers. People who received inactivated measles vaccine or an unknown type of measles vaccine during 1963-1967 should receive 2 doses of MMR vaccine. People who received inactivated mumps vaccine or an unknown type of mumps vaccine before 1979 and are at high risk for mumps infection should consider immunization with 2 doses of MMR vaccine. For females of  childbearing age, rubella immunity should be determined. If there is no evidence of immunity, females who are not pregnant should be vaccinated. If there is no evidence of immunity, females who are pregnant should delay immunization until after pregnancy. Unvaccinated health care workers born before [birth year] who lack laboratory evidence of measles, mumps, or rubella immunity or laboratory confirmation of disease should consider measles and mumps immunization with 2 doses of MMR vaccine or rubella immunization with 1 dose of MMR vaccine.  Pneumococcal 13-valent conjugate (PCV13) vaccine. When indicated, a person who is uncertain of her immunization history and has no record of immunization should receive the PCV13 vaccine. An adult aged 67 years or older who has certain medical conditions and has not been previously immunized should receive 1 dose of PCV13 vaccine. This PCV13 should be followed with a dose of pneumococcal polysaccharide (PPSV23) vaccine. The PPSV23 vaccine dose should be obtained at least 8 weeks after the dose of PCV13 vaccine. An adult aged 82 years or older who has certain medical conditions and previously received 1 or more doses of PPSV23 vaccine should receive 1 dose of PCV13. The PCV13 vaccine dose should be obtained 1 or more years after the last PPSV23 vaccine dose.    Pneumococcal polysaccharide (PPSV23) vaccine. When PCV13 is also indicated, PCV13 should be obtained first. All adults aged 41 years and older should be immunized. An adult younger than age 28 years who has certain medical conditions should be immunized. Any person who resides in a nursing home or long-term care facility should be immunized. An adult smoker should be immunized. People with an immunocompromised condition and certain other conditions should receive both PCV13 and PPSV23 vaccines. People with human immunodeficiency virus (HIV) infection should be immunized as soon as possible after diagnosis. Immunization  during chemotherapy or radiation therapy should be avoided. Routine use of PPSV23 vaccine is not recommended for American Indians, [place]atives, or people younger than 65 years unless there are medical conditions that require PPSV23 vaccine. When indicated, people who have unknown immunization and have no record of immunization should receive PPSV23 vaccine. One-time revaccination 5 years after the first dose of PPSV23 is recommended for people aged 19-64 years who have chronic kidney failure, nephrotic syndrome, asplenia, or immunocompromised conditions. People who received 1-2 doses of PPSV23 before age 67 years should receive another dose of PPSV23 vaccine at age 6 years or later if at least 5 years  have passed since the previous dose. Doses of PPSV23 are not needed for people immunized with PPSV23 at or after age 13 years.  Preventive Services / Frequency   Ages 73 to 31 years  Blood pressure check.  Lipid and cholesterol check.  Lung cancer screening. / Every year if you are aged 58-80 years and have a 30-pack-year history of smoking and currently smoke or have quit within the past 15 years. Yearly screening is stopped once you have quit smoking for at least 15 years or develop a health problem that would prevent you from having lung cancer treatment.  Clinical breast exam.** / Every year after age 69 years.  BRCA-related cancer risk assessment.** / For women who have family members with a BRCA-related cancer (breast, ovarian, tubal, or peritoneal cancers).  Mammogram.** / Every year beginning at age 39 years and continuing for as long as you are in good health. Consult with your health care provider.  Pap test.** / Every 3 years starting at age 51 years through age 68 or 20 years with a history of 3 consecutive normal Pap tests.  HPV screening.** / Every 3 years from ages 42 years through ages 55 to 70 years with a history of 3 consecutive normal Pap tests.  Fecal occult blood test  (FOBT) of stool. / Every year beginning at age 33 years and continuing until age 63 years. You may not need to do this test if you get a colonoscopy every 10 years.  Flexible sigmoidoscopy or colonoscopy.** / Every 5 years for a flexible sigmoidoscopy or every 10 years for a colonoscopy beginning at age 73 years and continuing until age 2 years.  Hepatitis C blood test.** / For all people born from [birth year] through 1965 and any individual with known risks for hepatitis C.  Skin self-exam. / Monthly.  Influenza vaccine. / Every year.  Tetanus, diphtheria, and acellular pertussis (Tdap/Td) vaccine.** / Consult your health care provider. Pregnant women should receive 1 dose of Tdap vaccine during each pregnancy. 1 dose of Td every 10 years.  Varicella vaccine.** / Consult your health care provider. Pregnant females who do not have evidence of immunity should receive the first dose after pregnancy.  Zoster vaccine.** / 1 dose for adults aged 24 years or older.  Pneumococcal 13-valent conjugate (PCV13) vaccine.** / Consult your health care provider.  Pneumococcal polysaccharide (PPSV23) vaccine.** / 1 to 2 doses if you smoke cigarettes or if you have certain conditions.  Meningococcal vaccine.** / Consult your health care provider.  Hepatitis A vaccine.** / Consult your health care provider.  Hepatitis B vaccine.** / Consult your health care provider. Screening for abdominal aortic aneurysm (AAA)  by ultrasound is recommended for people over 50 who have history of high blood pressure or who are current or former smokers.

## 2015-03-15 LAB — URINALYSIS, ROUTINE W REFLEX MICROSCOPIC
Bilirubin Urine: NEGATIVE
Glucose, UA: NEGATIVE
HGB URINE DIPSTICK: NEGATIVE
Ketones, ur: NEGATIVE
LEUKOCYTES UA: NEGATIVE
NITRITE: NEGATIVE
PH: 6.5 (ref 5.0–8.0)
Protein, ur: NEGATIVE
SPECIFIC GRAVITY, URINE: 1.015 (ref 1.001–1.035)

## 2015-03-15 LAB — MICROALBUMIN / CREATININE URINE RATIO
Creatinine, Urine: 103.7 mg/dL
Microalb Creat Ratio: 3.9 mg/g (ref 0.0–30.0)
Microalb, Ur: 0.4 mg/dL

## 2015-03-15 LAB — HEMOGLOBIN A1C
Hgb A1c MFr Bld: 6 % — ABNORMAL HIGH
Mean Plasma Glucose: 126 mg/dL — ABNORMAL HIGH

## 2015-03-15 LAB — VITAMIN D 25 HYDROXY (VIT D DEFICIENCY, FRACTURES): Vit D, 25-Hydroxy: 50 ng/mL (ref 30–100)

## 2015-03-15 LAB — INSULIN, RANDOM: Insulin: 13.8 u[IU]/mL (ref 2.0–19.6)

## 2015-03-17 ENCOUNTER — Other Ambulatory Visit: Payer: Self-pay | Admitting: Medical Oncology

## 2015-03-17 ENCOUNTER — Telehealth: Payer: Self-pay | Admitting: Medical Oncology

## 2015-03-17 DIAGNOSIS — C9000 Multiple myeloma not having achieved remission: Secondary | ICD-10-CM

## 2015-03-17 MED ORDER — MORPHINE SULFATE ER 15 MG PO TBCR: 15.0000 mg | EXTENDED_RELEASE_TABLET | Freq: Two times a day (BID) | ORAL | Status: DC

## 2015-03-17 NOTE — Telephone Encounter (Signed)
Pt notified that rx ready for pick up today by [id number]

## 2015-03-17 NOTE — Telephone Encounter (Signed)
LATE ENTRY- VOICE MAIL AT 11:24AM/ FORWARD AT 12:44PM REFILL REQUEST FOR MS CONTIN.

## 2015-03-23 ENCOUNTER — Ambulatory Visit (HOSPITAL_BASED_OUTPATIENT_CLINIC_OR_DEPARTMENT_OTHER): Payer: Medicare Other

## 2015-03-23 ENCOUNTER — Other Ambulatory Visit (HOSPITAL_BASED_OUTPATIENT_CLINIC_OR_DEPARTMENT_OTHER): Payer: Medicare Other

## 2015-03-23 ENCOUNTER — Ambulatory Visit (HOSPITAL_BASED_OUTPATIENT_CLINIC_OR_DEPARTMENT_OTHER): Payer: Medicare Other | Admitting: Internal Medicine

## 2015-03-23 ENCOUNTER — Telehealth: Payer: Self-pay | Admitting: Internal Medicine

## 2015-03-23 VITALS — BP 109/58 | HR 69 | Temp 98.6°F | Resp 18 | Ht 65.0 in | Wt 198.8 lb

## 2015-03-23 DIAGNOSIS — C9000 Multiple myeloma not having achieved remission: Secondary | ICD-10-CM

## 2015-03-23 DIAGNOSIS — Z23 Encounter for immunization: Secondary | ICD-10-CM | POA: Diagnosis not present

## 2015-03-23 LAB — CBC WITH DIFFERENTIAL/PLATELET
BASO%: 0.5 % (ref 0.0–2.0)
Basophils Absolute: 0 10*3/uL (ref 0.0–0.1)
EOS%: 4.5 % (ref 0.0–7.0)
Eosinophils Absolute: 0.2 10*3/uL (ref 0.0–0.5)
HCT: 32.2 % — ABNORMAL LOW (ref 34.8–46.6)
HGB: 10.2 g/dL — ABNORMAL LOW (ref 11.6–15.9)
LYMPH#: 0.6 10*3/uL — AB (ref 0.9–3.3)
LYMPH%: 14 % (ref 14.0–49.7)
MCH: 31.2 pg (ref 25.1–34.0)
MCHC: 31.7 g/dL (ref 31.5–36.0)
MCV: 98.5 fL (ref 79.5–101.0)
MONO#: 0.2 10*3/uL (ref 0.1–0.9)
MONO%: 4 % (ref 0.0–14.0)
NEUT%: 77 % — AB (ref 38.4–76.8)
NEUTROS ABS: 3.1 10*3/uL (ref 1.5–6.5)
PLATELETS: 194 10*3/uL (ref 145–400)
RBC: 3.27 10*6/uL — AB (ref 3.70–5.45)
RDW: 16.3 % — ABNORMAL HIGH (ref 11.2–14.5)
WBC: 4 10*3/uL (ref 3.9–10.3)

## 2015-03-23 LAB — COMPREHENSIVE METABOLIC PANEL (CC13)
ALT: 12 U/L (ref 0–55)
AST: 9 U/L (ref 5–34)
Albumin: 2.9 g/dL — ABNORMAL LOW (ref 3.5–5.0)
Alkaline Phosphatase: 90 U/L (ref 40–150)
Anion Gap: 7 mEq/L (ref 3–11)
BILIRUBIN TOTAL: 0.75 mg/dL (ref 0.20–1.20)
BUN: 9.1 mg/dL (ref 7.0–26.0)
CHLORIDE: 106 meq/L (ref 98–109)
CO2: 27 meq/L (ref 22–29)
CREATININE: 0.9 mg/dL (ref 0.6–1.1)
Calcium: 8.8 mg/dL (ref 8.4–10.4)
EGFR: 84 mL/min/{1.73_m2} — ABNORMAL LOW (ref >90)
GLUCOSE: 111 mg/dL (ref 70–140)
Potassium: 3.7 mEq/L (ref 3.5–5.1)
Sodium: 140 mEq/L (ref 136–145)
TOTAL PROTEIN: 5.8 g/dL — AB (ref 6.4–8.3)

## 2015-03-23 LAB — LACTATE DEHYDROGENASE (CC13): LDH: 151 U/L (ref 125–245)

## 2015-03-23 MED ORDER — INFLUENZA VAC SPLIT QUAD 0.5 ML IM SUSY
0.5000 mL | PREFILLED_SYRINGE | Freq: Once | INTRAMUSCULAR | Status: AC
  Administered 2015-03-23: 0.5 mL via INTRAMUSCULAR
  Filled 2015-03-23: qty 0.5

## 2015-03-23 NOTE — Telephone Encounter (Signed)
per pof to sch pt appt-gave pt copy of avs °

## 2015-03-23 NOTE — Progress Notes (Signed)
[facility] [phone]) [phone]   Fax:([phone]) [place], [place] [address]  DIAGNOSIS: Recurrent multiple myeloma initially diagnosed in October 2008.   PRIOR THERAPY:  1. Status post palliative radiotherapy to the lumbar spine between L3 and L5. The patient received a total dose of 3000 cGy in 10 fractions under the care of Dr. Lisbeth Renshaw between May 05, 2007 through May 18, 2007. 2. Status post 5 cycles of systemic chemotherapy with Revlimid and low-dose Decadron with good response to this treatment. 3. Status post autologous peripheral blood stem cell transplant at [facility] on Nov 20, 2007 under the care of Dr. Valarie Merino. 4. Status post treatment for disease recurrence with Velcade, Doxil and Decadron. Last dose given Nov 09, 2009. Discontinued secondary to intolerance but the patient had a good response to treatment at that time. 5. Status post palliative radiotherapy to the T2-T6 thoracic vertebrae completed 03/21/2011 under the care of Dr. Lisbeth Renshaw. 6. Systemic chemotherapy with Velcade 1.3 mg per meter squared given on days 1, 4, 8 and 11, and Doxil at 30 mg per meter squared given on day 4 in addition to Decadron status post 4 cycles, discontinued secondary to intolerance. 7. Systemic therapy with Velcade 1.3 mg/M2 subcutaneously in addition to Decadron 40 mg by mouth on a weekly basis, status post 20 cycles. The patient had good response with this treatment but it is discontinue today secondary to worsening peripheral neuropathy. 8. Palliative radiotherapy to the skull lesion as well as the left hip area under the care of Dr. Lisbeth Renshaw. 9. Systemic chemotherapy with Carfilzomib 20 mg/M2 on days 1, 2,  8, 9, 13 and 16 every 4 weeks in addition to weekly Decadron 40 mg by mouth. First dose on 04/19/2013. Status post 4 cycles, discontinued recently secondary to cardiac  dysfunction.  CURRENT THERAPY:  1. Pomalyst 4 mg by mouth daily for 21 days every 4 weeks in addition to dexamethasone 40 mg on a weekly basis. First dose started 12/02/2013. Status post 15 cycles. She started cycle #16 on 03/16/2015. 2. Zometa 4 mg IV given every 3 months   INTERVAL HISTORY: [name] 64 y.o. female returns to the clinic today for follow up visit accompanied by her daughter. The patient is feeling fine today with no specific complaints except for the intermittent back pain that has been going on for years. She is tolerating her current treatment with Pomalyst fairly well with no significant adverse effects. She denied having any significant fatigue or weakness. The patient denied having any significant chest pain, shortness of breath, or hemoptysis. She has no nausea or vomiting. She has no weight loss or night sweats. She continues to have mild peripheral neuropathy and currently on gabapentin. She was seen recently at [facility] by Dr. Evelene Croon and had protein is study performed today but the results are not available for me.  MEDICAL HISTORY: Past Medical History  Diagnosis Date  . Hypercholesterolemia   . Compression fracture 04/08/2007    pathologic compression fracture  . Hypothyroidism   . FHx: chemotherapy     s/p 5 cycle revlimid/low dose decadron,s/p velcade,doxil,decadron,  . Hx of radiation therapy 05/05/07-05/18/07,& 03/05/11-03/21/11-    l3&l5 in 2008, t2-t6 03/2011  . GERD (gastroesophageal reflux disease)   . Insomnia     associated with steroids  . Constipation     takes oxycontin,vicodin  . Hx of radiation therapy  05/05/2007 to 05/18/2007    palliative, L3-5  . Hx of radiation therapy 03/05/2011 to 03/21/2011    palliative T2-T6, c-spine  . 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 PONV (postoperative nausea and vomiting)   . Multiple myeloma dx'd 2009  . Metastasis to bone   . Family history of anesthesia  complication     "daughter gets PONV too"  . Pneumonia     "several times"  . Stroke 2014    denies residual on 01/27/2014    ALLERGIES:  has No Known Allergies.  MEDICATIONS:  Current Outpatient Prescriptions  Medication Sig Dispense Refill  . ALPRAZolam (XANAX) 0.5 MG tablet Take 1/2 to 1 tablet 2 to 3 x daily as needed for nerves (Patient taking differently: Take 0.5 mg by mouth 3 (three) times daily as needed. ) 90 tablet 5  . aspirin 81 MG tablet Take 81 mg by mouth daily.    . citalopram (CELEXA) 40 MG tablet Take 1 tablet (40 mg total) by mouth daily. 90 tablet 2  . cyclobenzaprine (FLEXERIL) 10 MG tablet TAKE 1/2 TO 1 TABLET BY MOUTH THREE TIMES DAILY FOR MUSCLE SPASMS 90 tablet 2  . dexamethasone (DECADRON) 4 MG tablet TAKE 10 TABLETS BY MOUTH EVERY FRIDAY 40 tablet 1  . diazepam (VALIUM) 10 MG tablet Take 1 tablet (10 mg total) by mouth every 6 (six) hours as needed for anxiety. 30 tablet 2  . diphenoxylate-atropine (LOMOTIL) 2.5-0.025 MG per tablet Take 2 tablets by mouth 4 (four) times daily as needed for diarrhea or loose stools. 30 tablet 1  . Eszopiclone 3 MG TABS Take 1 tablet (3 mg total) by mouth at bedtime as needed (sleep). Take immediately before bedtime 30 tablet 5  . fexofenadine (ALLEGRA) 60 MG tablet Take 1 tablet daily as needed for allergies 90 tablet 1  . furosemide (LASIX) 40 MG tablet TAKE 1 TABLET BY MOUTH TWICE A DAY FOR FLUID AND SWELLING 60 tablet 3  . gabapentin (NEURONTIN) 600 MG tablet Take 600 mg by mouth 4 (four) times daily. For pain or cramps    . HYDROcodone-acetaminophen (NORCO) 7.5-325 MG per tablet Take 1 tablet by mouth every 6 (six) hours as needed for moderate pain. 30 tablet 0  . hyoscyamine (LEVSIN SL) 0.125 MG SL tablet Place 1 tablet (0.125 mg total) under the tongue every 6 (six) hours as needed. (Patient taking differently: Place 0.125 mg under the tongue every 6 (six) hours as needed for cramping. ) 30 tablet 4  . KLOR-CON 10 10 MEQ  tablet Take 10 mEq by mouth 2 (two) times daily.  3  . levothyroxine (SYNTHROID, LEVOTHROID) 100 MCG tablet Take 100 mcg by mouth daily before breakfast.     . Magnesium Hydroxide 400 MG CHEW Chew 3 tablets by mouth daily.    Marland Kitchen morphine (MS CONTIN) 15 MG 12 hr tablet Take 1 tablet (15 mg total) by mouth every 12 (twelve) hours. 60 tablet 0  . Multiple Vitamin (MULITIVITAMIN WITH MINERALS) TABS Take 1 tablet by mouth every morning.     . ondansetron (ZOFRAN-ODT) 8 MG disintegrating tablet TAKE 1 TABLET BY MOUTH EVERY 8 HOURS AS NEEDED FOR NAUSEA AND VOMITING 30 tablet 1  . pantoprazole (PROTONIX) 40 MG tablet TAKE 1 TABLET (40 MG TOTAL) BY MOUTH 2 (TWO) TIMES DAILY. 60 tablet 5  . pomalidomide (POMALYST) 4 MG capsule Take 1 capsule (4 mg total) by mouth daily. Take with water on  days 1-21. Repeat every 28 days. 21 capsule 0  . promethazine-dextromethorphan (PROMETHAZINE-DM) 6.25-15 MG/5ML syrup TAKE 5MLS BY MOUTH 4 TIMES A DAY AS NEEDED FOR COUGH 180 mL 0   No current facility-administered medications for this visit.    SURGICAL HISTORY:  Past Surgical History  Procedure Laterality Date  . Knee arthroscopy Right     "put pin in"  . Knee arthroscopy Right     "took pin out and corrected what was wrong"  . Video bronchoscopy  07/30/2011    Procedure: VIDEO BRONCHOSCOPY WITHOUT FLUORO;  Surgeon: Kathee Delton, MD;  Location: [facility];  Service: Cardiopulmonary;  Laterality: Bilateral;  . Cholecystectomy  1980's  . Portacath placement Right 2009  . Vaginal hysterectomy  1980's    REVIEW OF SYSTEMS:  Constitutional: negative Eyes: negative Ears, nose, mouth, throat, and face: negative Respiratory: negative Cardiovascular: negative Gastrointestinal: negative Genitourinary:negative Integument/breast: negative Hematologic/lymphatic: negative Musculoskeletal:positive for back pain Neurological: negative Behavioral/Psych: negative   PHYSICAL EXAMINATION: General appearance: alert,  cooperative, fatigued and no distress Head: Normocephalic, without obvious abnormality, atraumatic Neck: no adenopathy, no JVD, supple, symmetrical, trachea midline and thyroid not enlarged, symmetric, no tenderness/mass/nodules Lymph nodes: Cervical, supraclavicular, and axillary nodes normal. Resp: clear to auscultation bilaterally Back: symmetric, no curvature. ROM normal. No CVA tenderness. Cardio: regular rate and rhythm, S1, S2 normal, no murmur, click, rub or gallop GI: soft, non-tender; bowel sounds normal; no masses,  no organomegaly Extremities: edema 1+ edema of left lower extremity. Neurologic: Alert and oriented X 3, normal strength and tone. Normal symmetric reflexes. Normal coordination and gait  ECOG PERFORMANCE STATUS: 1 - Symptomatic but completely ambulatory  There were no vitals taken for this visit.  LABORATORY DATA: Lab Results  Component Value Date   WBC 4.0 03/23/2015   HGB 10.2* 03/23/2015   HCT 32.2* 03/23/2015   MCV 98.5 03/23/2015   PLT 194 03/23/2015      Chemistry      Component Value Date/Time   NA 141 03/14/2015 1111   NA 140 02/14/2015 0843   K 3.8 03/14/2015 1111   K 4.1 02/14/2015 0843   CL 106 03/14/2015 1111   CL 105 12/17/2012 1015   CO2 25 03/14/2015 1111   CO2 24 02/14/2015 0843   BUN 12 03/14/2015 1111   BUN 15.2 02/14/2015 0843   CREATININE 0.68 03/14/2015 1111   CREATININE 0.9 02/14/2015 0843   CREATININE 0.71 01/13/2015 1559      Component Value Date/Time   CALCIUM 8.0* 03/14/2015 1111   CALCIUM 9.4 02/14/2015 0843   ALKPHOS 74 03/14/2015 1111   ALKPHOS 56 02/14/2015 0843   AST 10 03/14/2015 1111   AST 7 02/14/2015 0843   ALT 11 03/14/2015 1111   ALT 12 02/14/2015 0843   BILITOT 1.0 03/14/2015 1111   BILITOT 0.69 02/14/2015 0843      RADIOGRAPHIC STUDIES:  ASSESSMENT AND PLAN:  This is a very pleasant 64 years old [place] American female with recurrent multiple myeloma recently completed a course of treatment with  Velcade and Decadron with improvement in her disease but this was discontinued secondary to peripheral neuropathy. The patient tolerated her treatment with Carfilzomib and Decadron fairly well except for the recent shortness breath and cough which was felt to be secondary to congestive heart failure from her treatment with Carfilzomib. This treatment was discontinued. She was started on treatment with Pomalyst and Decadron status post 15 cycles. She tolerated the last cycle of her treatment well. I recommended for  the patient to proceed with cycle #16 as a scheduled.  She would come back for followup visit in 4 weeks for evaluation after repeating CBC, comprehensive metabolic panel and LDH. The patient will continue her current treatment with Zometa every 3 months as scheduled.  She was advised to call immediately if she has any concerning symptoms in the interval. The patient voices understanding of current disease status and treatment options and is in agreement with the current care plan.  All questions were answered. The patient knows to call the clinic with any problems, questions or concerns. We can certainly see the patient much sooner if necessary.  Disclaimer: This note was dictated with voice recognition software. Similar sounding words can inadvertently be transcribed and may be missed upon review.

## 2015-03-24 ENCOUNTER — Other Ambulatory Visit: Payer: Self-pay | Admitting: Physician Assistant

## 2015-03-24 MED ORDER — AZITHROMYCIN 250 MG PO TABS: ORAL_TABLET | ORAL | Status: AC

## 2015-03-25 ENCOUNTER — Encounter: Payer: Self-pay | Admitting: Internal Medicine

## 2015-03-30 ENCOUNTER — Ambulatory Visit (INDEPENDENT_AMBULATORY_CARE_PROVIDER_SITE_OTHER): Payer: Medicare Other | Admitting: Internal Medicine

## 2015-03-30 ENCOUNTER — Encounter: Payer: Self-pay | Admitting: Internal Medicine

## 2015-03-30 VITALS — BP 124/82 | HR 72 | Temp 98.0°F | Resp 16 | Ht 65.0 in | Wt 197.0 lb

## 2015-03-30 DIAGNOSIS — R609 Edema, unspecified: Secondary | ICD-10-CM | POA: Diagnosis not present

## 2015-03-30 DIAGNOSIS — J309 Allergic rhinitis, unspecified: Secondary | ICD-10-CM | POA: Diagnosis not present

## 2015-03-30 DIAGNOSIS — M940 Chondrocostal junction syndrome [Tietze]: Secondary | ICD-10-CM

## 2015-03-30 DIAGNOSIS — R600 Localized edema: Secondary | ICD-10-CM

## 2015-03-30 MED ORDER — FUROSEMIDE 40 MG PO TABS: ORAL_TABLET | ORAL | Status: DC

## 2015-03-30 MED ORDER — MOMETASONE FUROATE 50 MCG/ACT NA SUSP: 2.0000 | Freq: Every day | NASAL | Status: DC

## 2015-03-30 MED ORDER — MELOXICAM 7.5 MG PO TABS: 7.5000 mg | ORAL_TABLET | Freq: Every day | ORAL | Status: DC

## 2015-03-30 MED ORDER — MONTELUKAST SODIUM 10 MG PO TABS: 10.0000 mg | ORAL_TABLET | Freq: Every day | ORAL | Status: DC

## 2015-03-30 NOTE — Progress Notes (Signed)
Subjective:    Patient ID: [name], female    DOB: [birth year], 64 y.o.   MRN: [id number]  Sore Throat  Associated symptoms include congestion and coughing. Pertinent negatives include no shortness of breath.  Patient presents to the office for evaluation of cough, sore throat, and chest pain after coughing.  She reports that she has finished her antibiotic and felt that this didn't help at all.  She reports that she has been taking her cough medication which helps with the coughing.  She reports she felt marginally better with antibiotic but really no change.  She does have a history of bad seasonal allergies and used to be on monteluekast daily.  She has had no fevers or chills.  She continues to have nasal congestion, post nasal drip, dry cough.  Patient also complains of continued leg swelling that is pain free.  She is feels that the left side is worse than the right side.  She has not been consistently wearing her compression stockings, She does take lasix daily which helps her go to the bathroom but doesn't always help with her leg swelling.    Patient also reports that she has been having some chest pain espeically with movement.  She reports no anginal symptoms or shortness of breath.  She does take decadron 40 mg on Friday.  She has no injury.  She had a normal ekg at recent physical.  She has been coughing a lot.  Review of Systems  Constitutional: Positive for fatigue. Negative for fever and chills.  HENT: Positive for congestion, postnasal drip, rhinorrhea, sinus pressure and sore throat.   Respiratory: Positive for cough. Negative for chest tightness, shortness of breath and wheezing.   Cardiovascular: Positive for chest pain and leg swelling.       Objective:   Physical Exam  Constitutional: She is oriented to person, place, and time. She appears well-developed and well-nourished. No distress.  HENT:  Head: Normocephalic.  Mouth/Throat: Oropharynx is clear and  moist.  Eyes: Conjunctivae are normal. No scleral icterus.  Neck: Normal range of motion. Neck supple. No JVD present. No thyromegaly present.  Cardiovascular: Normal rate, regular rhythm, normal heart sounds and intact distal pulses.  Exam reveals no gallop and no friction rub.   No murmur heard. Left leg swelling present that is not pitting.  Trace edema of the right leg.    Pulmonary/Chest: Effort normal and breath sounds normal. No respiratory distress. She has no wheezes. She has no rales. She exhibits tenderness.  Abdominal: Soft. Bowel sounds are normal. She exhibits no distension and no mass. There is no tenderness. There is no rebound and no guarding.  Musculoskeletal: Normal range of motion.  Lymphadenopathy:    She has no cervical adenopathy.  Neurological: She is alert and oriented to person, place, and time.  Skin: Skin is warm and dry. She is not diaphoretic.  Psychiatric: She has a normal mood and affect. Her behavior is normal. Judgment and thought content normal.  Nursing note and vitals reviewed.   Filed Vitals:   03/30/15 1009  BP: 124/82  Pulse: 72  Temp: 98 F (36.7 C)  Resp: 16         Assessment & Plan:    1. Allergic rhinitis, unspecified allergic rhinitis type  - montelukast (SINGULAIR) 10 MG tablet; Take 1 tablet (10 mg total) by mouth daily.  Dispense: 30 tablet; Refill: 2 - mometasone (NASONEX) 50 MCG/ACT nasal spray; Place 2 sprays into the nose  daily.  Dispense: 17 g; Refill: 2  2. Peripheral edema  - furosemide (LASIX) 40 MG tablet; Take lasix 80 mg daily in the morning and 40 mg in the afternoons.  Do not take after 5 pm.  Dispense: 90 tablet; Refill: 3  3. Costochondritis  - meloxicam (MOBIC) 7.5 MG tablet; Take 1 tablet (7.5 mg total) by mouth daily.  Dispense: 30 tablet; Refill: 2

## 2015-03-30 NOTE — Patient Instructions (Addendum)
Please take 80 mg of the lasix first thing in the morning and 40 mg of lasix between 12-2 pm.  Do not take your fluid pill after 5 pm.    Please start taking singulair daily.  You can take it in the morning or in th evenings.  Please use nasonex in your nose 2 sprays before bed time.  Do not blow your nose. Spray towards the top of your head or out towards your ear.     Please start taking mobic daily with food.  Do not take ibuprofen, aleve, advil or motrin with it.

## 2015-03-31 ENCOUNTER — Other Ambulatory Visit: Payer: Self-pay | Admitting: Internal Medicine

## 2015-04-06 ENCOUNTER — Other Ambulatory Visit: Payer: Self-pay | Admitting: Medical Oncology

## 2015-04-06 DIAGNOSIS — C9000 Multiple myeloma not having achieved remission: Secondary | ICD-10-CM

## 2015-04-06 MED ORDER — POMALIDOMIDE 4 MG PO CAPS: 4.0000 mg | ORAL_CAPSULE | Freq: Every day | ORAL | Status: DC

## 2015-04-12 ENCOUNTER — Encounter: Payer: Self-pay | Admitting: Internal Medicine

## 2015-04-12 NOTE — Progress Notes (Signed)
Per biologics pomalyst was shipped via [employer]

## 2015-04-20 ENCOUNTER — Other Ambulatory Visit: Payer: Self-pay | Admitting: *Deleted

## 2015-04-20 DIAGNOSIS — C9000 Multiple myeloma not having achieved remission: Secondary | ICD-10-CM

## 2015-04-20 MED ORDER — MORPHINE SULFATE ER 15 MG PO TBCR: 15.0000 mg | EXTENDED_RELEASE_TABLET | Freq: Two times a day (BID) | ORAL | Status: DC

## 2015-04-21 ENCOUNTER — Ambulatory Visit (INDEPENDENT_AMBULATORY_CARE_PROVIDER_SITE_OTHER): Payer: Medicare Other | Admitting: Internal Medicine

## 2015-04-21 ENCOUNTER — Encounter: Payer: Self-pay | Admitting: Internal Medicine

## 2015-04-21 ENCOUNTER — Telehealth: Payer: Self-pay | Admitting: *Deleted

## 2015-04-21 VITALS — BP 110/64 | HR 72 | Temp 97.3°F | Resp 16 | Ht 64.75 in | Wt 204.6 lb

## 2015-04-21 DIAGNOSIS — M79676 Pain in unspecified toe(s): Secondary | ICD-10-CM

## 2015-04-21 DIAGNOSIS — Z79899 Other long term (current) drug therapy: Secondary | ICD-10-CM | POA: Diagnosis not present

## 2015-04-21 LAB — CBC WITH DIFFERENTIAL/PLATELET
BASOS ABS: 0 10*3/uL (ref 0.0–0.1)
Basophils Relative: 1 % (ref 0–1)
EOS PCT: 6 % — AB (ref 0–5)
Eosinophils Absolute: 0.2 10*3/uL (ref 0.0–0.7)
HEMATOCRIT: 31.5 % — AB (ref 36.0–46.0)
HEMOGLOBIN: 9.9 g/dL — AB (ref 12.0–15.0)
LYMPHS ABS: 0.7 10*3/uL (ref 0.7–4.0)
LYMPHS PCT: 24 % (ref 12–46)
MCH: 30.8 pg (ref 26.0–34.0)
MCHC: 31.4 g/dL (ref 30.0–36.0)
MCV: 98.1 fL (ref 78.0–100.0)
MPV: 10.8 fL (ref 8.6–12.4)
Monocytes Absolute: 0.2 10*3/uL (ref 0.1–1.0)
Monocytes Relative: 5 % (ref 3–12)
NEUTROS ABS: 2 10*3/uL (ref 1.7–7.7)
NEUTROS PCT: 64 % (ref 43–77)
Platelets: 239 10*3/uL (ref 150–400)
RBC: 3.21 MIL/uL — AB (ref 3.87–5.11)
RDW: 16.8 % — AB (ref 11.5–15.5)
WBC: 3.1 10*3/uL — AB (ref 4.0–10.5)

## 2015-04-21 LAB — URIC ACID: Uric Acid, Serum: 5.3 mg/dL (ref 2.4–7.0)

## 2015-04-21 NOTE — Progress Notes (Signed)
Subjective:    Patient ID: [name], female    DOB: [birth year], 64 y.o.   MRN: [id number]  HPI Presents today with c/o pains in the mid-portions of both great toes w/o hx/o injury other than recently "too tight" shoes. No fever, chills, rash.  Medication Sig  . ALPRAZolam  0.5 MG tablet Take 0.5 mg by mouth 3 (three) times daily as needed  . aspirin 81 MG tab Take 81 mg by mouth daily.  . citalopram  40 MG tab Take 1 tablet (40 mg total) by mouth daily.  . cyclobenzaprine  10 MG tab TAKE 1/2 TO 1 TABLET BY MOUTH THREE TIMES DAILY FOR MUSCLE SPASMS  . dexamethasone  4 MG tab TAKE (10) TABLETS BY MOUTH EVERY FRIDAY  . diazepam  10 MG tab Take 1 tablet (10 mg total) by mouth every 6 (six) hours as needed for anxiety.  . LOMOTIL 2.5-0.025  Take 2 tablets by mouth 4 (four) times daily as needed for diarrhea or loose stools.  . Eszopiclone 3 MG TAB Take 1 tablet (3 mg total) by mouth at bedtime as needed (sleep). Take immediately before bedtime  . fexofenadine 60 MG tab Take 1 tablet daily as needed for allergies  . furosemide  40 MG tab Take lasix 80 mg daily in the morning and 40 mg in the afternoons.  Do not take after 5 pm.  . gabapentin 600 MG tab Take 600 mg by mouth 4 (four) times daily. For pain or cramps  . NORCO 7.5-325 MG Take 1 tablet by mouth every 6 (six) hours as needed for moderate pain.  . hyoscyamineSL 0.125 MG SL Place 1 tablet (0.125 mg total) under the tongue every 6 (six) hours as needed. (Patient taking differently: Place 0.125 mg under the tongue every 6 (six) hours as needed for cramping. )  . KLOR-CON 10 MEQ tablet Take 10 mEq by mouth 2 (two) times daily.  Marland Kitchen levothyroxine  100 MCG tablet Take 100 mcg by mouth daily before breakfast.   . Magnesium  400 MG  Chew 3 tablets by mouth daily.  . meloxicam7.5 MG tablet Take 1 tablet (7.5 mg total) by mouth daily.  . mometasone  nasal spray Place 2 sprays into the nose daily.  . Montelukast 10 MG tablet Take 1 tablet  (10 mg total) by mouth daily.  . MS CONTIN 15 MG 12 hr tablet Take 1 tablet (15 mg total) by mouth every 12 (twelve) hours.  [name] WITH MINERALS Take 1 tablet by mouth every morning.   . -ODT) 8 MG disintegrating tablet TAKE 1 TABLET BY MOUTH EVERY 8 HOURS AS NEEDED FOR NAUSEA AND VOMITING  . pantoprazole (PROTONIX) 40 MG tablet TAKE 1 TABLET (40 MG TOTAL) BY MOUTH 2 (TWO) TIMES DAILY.  Marland Kitchen pomalidomide (POMALYST) 4 MG capsule Take 1 capsule (4 mg total) by mouth daily. Take with water on days 1-21. Repeat every 28 days.04/06/15 auth number [id number] to biologics   No Known Allergies   Past Medical History  Diagnosis Date  . Hypercholesterolemia   . Compression fracture 04/08/2007    pathologic compression fracture  . Hypothyroidism   . FHx: chemotherapy     s/p 5 cycle revlimid/low dose decadron,s/p velcade,doxil,decadron,  . Hx of radiation therapy 05/05/07-05/18/07,& 03/05/11-03/21/11-    l3&l5 in 2008, t2-t6 03/2011  . GERD (gastroesophageal reflux disease)   . Insomnia     associated with steroids  . Constipation     takes oxycontin,vicodin  .  Hx of radiation therapy 05/05/2007 to 05/18/2007    palliative, L3-5  . Hx of radiation therapy 03/05/2011 to 03/21/2011    palliative T2-T6, c-spine  . History of autologous stem cell transplant ([place]) 11/20/2007    [facility], Dr Valarie Merino  . PONV (postoperative nausea and vomiting)   . Multiple myeloma ([place]) dx'd 2009  . Metastasis to bone ([place])   . Family history of anesthesia complication     "daughter gets PONV too"  . Pneumonia     "several times"  . Stroke [facility]) 2014    denies residual on 01/27/2014   Review of Systems 10 point systems review negative except as above.    Objective:   Physical Exam  BP 110/64 mmHg  Pulse 72  Temp(Src) 97.3 F (36.3 C)  Resp 16  Ht 5' 4.75" (1.645 m)  Wt 204 lb 9.6 oz (92.806 kg)  BMI 34.30 kg/m2   HEENT - Eac's patent. TM's Nl. EOM's full. PERRLA. NasoOroPharynx clear. Neck - supple. Nl  Thyroid. Carotids 2+ & No bruits, nodes, JVD Chest - Clear equal BS w/o Rales, rhonchi, wheezes. Cor - Nl HS. RRR w/o sig MGR. PP 1(+). No edema. Abd - No palpable organomegaly, masses or tenderness. BS nl. MS- FROM w/o deformities. Muscle power, tone and bulk Nl. Gait Nl. Sl tender mid portion of bilat Rt & Lt toes w/o sts or, erythema or heat.  Neuro - No obvious Cr N abnormalities. Sensory, motor and Cerebellar functions appear Nl w/o focal abnormalities.    Assessment & Plan:   1. Pain of toe, unspecified laterality  - Recc increase her meloxicam 7.5 to 15 mg for several days   2. Medication management  - Uric acid - CBC with Differential/Platelet

## 2015-04-21 NOTE — Telephone Encounter (Signed)
Received VM message from patient @ 4496 pm.requesting refill on her MS Contin. She took her last one this morning. Reviewed chart. Prescription was printed out and signed on 04/20/15 and is available for pick up. TC back to patient and informed her of the above. She will be here before 4:15pm to pick it up.

## 2015-04-25 DIAGNOSIS — Z79899 Other long term (current) drug therapy: Secondary | ICD-10-CM | POA: Diagnosis not present

## 2015-04-25 DIAGNOSIS — C9000 Multiple myeloma not having achieved remission: Secondary | ICD-10-CM | POA: Diagnosis not present

## 2015-04-25 DIAGNOSIS — R269 Unspecified abnormalities of gait and mobility: Secondary | ICD-10-CM | POA: Diagnosis not present

## 2015-04-25 DIAGNOSIS — G3184 Mild cognitive impairment, so stated: Secondary | ICD-10-CM | POA: Diagnosis not present

## 2015-04-28 ENCOUNTER — Other Ambulatory Visit: Payer: Self-pay | Admitting: *Deleted

## 2015-04-28 ENCOUNTER — Telehealth: Payer: Self-pay | Admitting: *Deleted

## 2015-04-28 DIAGNOSIS — C9000 Multiple myeloma not having achieved remission: Secondary | ICD-10-CM

## 2015-04-28 MED ORDER — HYDROCODONE-ACETAMINOPHEN 7.5-325 MG PO TABS: 1.0000 | ORAL_TABLET | Freq: Four times a day (QID) | ORAL | Status: DC | PRN

## 2015-04-28 NOTE — Telephone Encounter (Signed)
Pt called for refill, MD out of office rx signed by MD on call. Pt notified ok to pick up 10/31, pt advised she has an appt on Monday and will pick up rx at the appt. No further concerns.

## 2015-04-28 NOTE — Telephone Encounter (Signed)
Patient called requesting "refill for Hydocodone 7.5 - 325 mg number [id number] I am out of.  Return number when ready for pick up is [phone]".

## 2015-05-01 ENCOUNTER — Other Ambulatory Visit: Payer: Self-pay | Admitting: Internal Medicine

## 2015-05-01 ENCOUNTER — Encounter: Payer: Self-pay | Admitting: Internal Medicine

## 2015-05-01 ENCOUNTER — Ambulatory Visit (HOSPITAL_BASED_OUTPATIENT_CLINIC_OR_DEPARTMENT_OTHER): Payer: Medicare Other | Admitting: Internal Medicine

## 2015-05-01 ENCOUNTER — Telehealth: Payer: Self-pay | Admitting: Internal Medicine

## 2015-05-01 ENCOUNTER — Other Ambulatory Visit (HOSPITAL_BASED_OUTPATIENT_CLINIC_OR_DEPARTMENT_OTHER): Payer: Medicare Other

## 2015-05-01 VITALS — BP 103/77 | HR 77 | Temp 97.5°F | Resp 20 | Ht 64.0 in | Wt 198.5 lb

## 2015-05-01 DIAGNOSIS — C9002 Multiple myeloma in relapse: Secondary | ICD-10-CM

## 2015-05-01 DIAGNOSIS — G620 Drug-induced polyneuropathy: Secondary | ICD-10-CM | POA: Diagnosis not present

## 2015-05-01 DIAGNOSIS — C9000 Multiple myeloma not having achieved remission: Secondary | ICD-10-CM

## 2015-05-01 DIAGNOSIS — G4700 Insomnia, unspecified: Secondary | ICD-10-CM

## 2015-05-01 LAB — COMPREHENSIVE METABOLIC PANEL (CC13)
ALT: 10 U/L (ref 0–55)
AST: 8 U/L (ref 5–34)
Albumin: 3.1 g/dL — ABNORMAL LOW (ref 3.5–5.0)
Alkaline Phosphatase: 62 U/L (ref 40–150)
Anion Gap: 7 mEq/L (ref 3–11)
BUN: 10.3 mg/dL (ref 7.0–26.0)
CHLORIDE: 108 meq/L (ref 98–109)
CO2: 29 meq/L (ref 22–29)
Calcium: 8.5 mg/dL (ref 8.4–10.4)
Creatinine: 0.8 mg/dL (ref 0.6–1.1)
EGFR: 85 mL/min/{1.73_m2} — AB (ref >90)
GLUCOSE: 107 mg/dL (ref 70–140)
POTASSIUM: 3.4 meq/L — AB (ref 3.5–5.1)
SODIUM: 144 meq/L (ref 136–145)
Total Bilirubin: 0.78 mg/dL (ref 0.20–1.20)
Total Protein: 5.9 g/dL — ABNORMAL LOW (ref 6.4–8.3)

## 2015-05-01 LAB — CBC WITH DIFFERENTIAL/PLATELET
BASO%: 2.2 % — ABNORMAL HIGH (ref 0.0–2.0)
BASOS ABS: 0.1 10*3/uL (ref 0.0–0.1)
EOS%: 18 % — AB (ref 0.0–7.0)
Eosinophils Absolute: 0.4 10*3/uL (ref 0.0–0.5)
HCT: 32.2 % — ABNORMAL LOW (ref 34.8–46.6)
HGB: 10.5 g/dL — ABNORMAL LOW (ref 11.6–15.9)
LYMPH#: 0.9 10*3/uL (ref 0.9–3.3)
LYMPH%: 37.8 % (ref 14.0–49.7)
MCH: 31.4 pg (ref 25.1–34.0)
MCHC: 32.6 g/dL (ref 31.5–36.0)
MCV: 96.5 fL (ref 79.5–101.0)
MONO#: 0.3 10*3/uL (ref 0.1–0.9)
MONO%: 12.4 % (ref 0.0–14.0)
NEUT#: 0.7 10*3/uL — ABNORMAL LOW (ref 1.5–6.5)
NEUT%: 29.6 % — AB (ref 38.4–76.8)
Platelets: 183 10*3/uL (ref 145–400)
RBC: 3.33 10*6/uL — AB (ref 3.70–5.45)
RDW: 17.2 % — ABNORMAL HIGH (ref 11.2–14.5)
WBC: 2.4 10*3/uL — ABNORMAL LOW (ref 3.9–10.3)

## 2015-05-01 LAB — LACTATE DEHYDROGENASE (CC13): LDH: 122 U/L — ABNORMAL LOW (ref 125–245)

## 2015-05-01 MED ORDER — HYDROCODONE-ACETAMINOPHEN 7.5-325 MG PO TABS: 1.0000 | ORAL_TABLET | Freq: Four times a day (QID) | ORAL | Status: DC | PRN

## 2015-05-01 NOTE — Progress Notes (Signed)
[place] [phone]   Fax:([phone] [place], [place] [address]  DIAGNOSIS: Recurrent multiple myeloma initially diagnosed in October 2008.   PRIOR THERAPY:  1. Status post palliative radiotherapy to the lumbar spine between L3 and L5. The patient received a total dose of 3000 cGy in 10 fractions under the care of Dr. Lisbeth Renshaw between May 05, 2007 through May 18, 2007. 2. Status post 5 cycles of systemic chemotherapy with Revlimid and low-dose Decadron with good response to this treatment. 3. Status post autologous peripheral blood stem cell transplant at [facility] on Nov 20, 2007 under the care of Dr. Valarie Merino. 4. Status post treatment for disease recurrence with Velcade, Doxil and Decadron. Last dose given Nov 09, 2009. Discontinued secondary to intolerance but the patient had a good response to treatment at that time. 5. Status post palliative radiotherapy to the T2-T6 thoracic vertebrae completed 03/21/2011 under the care of Dr. Lisbeth Renshaw. 6. Systemic chemotherapy with Velcade 1.3 mg per meter squared given on days 1, 4, 8 and 11, and Doxil at 30 mg per meter squared given on day 4 in addition to Decadron status post 4 cycles, discontinued secondary to intolerance. 7. Systemic therapy with Velcade 1.3 mg/M2 subcutaneously in addition to Decadron 40 mg by mouth on a weekly basis, status post 20 cycles. The patient had good response with this treatment but it is discontinue today secondary to worsening peripheral neuropathy. 8. Palliative radiotherapy to the skull lesion as well as the left hip area under the care of Dr. Lisbeth Renshaw. 9. Systemic chemotherapy with Carfilzomib 20 mg/M2 on days 1, 2,  8, 9, 13 and 16 every 4 weeks in addition to weekly Decadron 40 mg by mouth. First dose on 04/19/2013. Status post 4 cycles, discontinued recently secondary to cardiac  dysfunction.  CURRENT THERAPY:  1. Pomalyst 4 mg by mouth daily for 21 days every 4 weeks in addition to dexamethasone 40 mg on a weekly basis. First dose started 12/02/2013. Status post 17 cycles. She started cycle #18 on 05/11/2015. 2. Zometa 4 mg IV given every 3 months   INTERVAL HISTORY: [name] 64 y.o. female returns to the clinic today for follow up visit accompanied by her daughter. The patient is feeling fine today with no specific complaints except for intermittent epigastric pain as well as diarrhea. She takes Imodium for diarrhea. She is tolerating her current treatment with Pomalyst fairly well with no significant adverse effects. She denied having any significant fatigue or weakness. The patient denied having any significant chest pain, shortness of breath, or hemoptysis. She has no nausea or vomiting. She has no weight loss or night sweats. She continues to have mild peripheral neuropathy and currently on gabapentin. She was seen recently at [facility] by Dr. Evelene Croon and had a PET scan performed on 04/25/2015.  MEDICAL HISTORY: Past Medical History  Diagnosis Date  . Hypercholesterolemia   . Compression fracture 04/08/2007    pathologic compression fracture  . Hypothyroidism   . FHx: chemotherapy     s/p 5 cycle revlimid/low dose decadron,s/p velcade,doxil,decadron,  . Hx of radiation therapy 05/05/07-05/18/07,& 03/05/11-03/21/11-    l3&l5 in 2008, t2-t6 03/2011  . GERD (gastroesophageal reflux disease)   . Insomnia     associated with steroids  . Constipation     takes oxycontin,vicodin  . Hx of radiation therapy 05/05/2007 to 05/18/2007  palliative, L3-5  . Hx of radiation therapy 03/05/2011 to 03/21/2011    palliative T2-T6, c-spine  . History of autologous stem cell transplant ([place]) 11/20/2007    [facility], Dr Valarie Merino  . PONV (postoperative nausea and vomiting)   . Multiple myeloma ([place]) dx'd 2009  . Metastasis to bone ([place])   . Family history of anesthesia  complication     "daughter gets PONV too"  . Pneumonia     "several times"  . Stroke [facility]) 2014    denies residual on 01/27/2014    ALLERGIES:  has No Known Allergies.  MEDICATIONS:  Current Outpatient Prescriptions  Medication Sig Dispense Refill  . ALPRAZolam (XANAX) 0.5 MG tablet Take 1/2 to 1 tablet 2 to 3 x daily as needed for nerves (Patient taking differently: Take 0.5 mg by mouth 3 (three) times daily as needed. ) 90 tablet 5  . aspirin 81 MG tablet Take 81 mg by mouth daily.    . citalopram (CELEXA) 40 MG tablet Take 1 tablet (40 mg total) by mouth daily. 90 tablet 2  . cyclobenzaprine (FLEXERIL) 10 MG tablet TAKE 1/2 TO 1 TABLET BY MOUTH THREE TIMES DAILY FOR MUSCLE SPASMS 90 tablet 2  . dexamethasone (DECADRON) 4 MG tablet TAKE (10) TABLETS BY MOUTH EVERY FRIDAY 40 tablet 1  . diazepam (VALIUM) 10 MG tablet Take 1 tablet (10 mg total) by mouth every 6 (six) hours as needed for anxiety. 30 tablet 2  . diphenoxylate-atropine (LOMOTIL) 2.5-0.025 MG per tablet Take 2 tablets by mouth 4 (four) times daily as needed for diarrhea or loose stools. 30 tablet 1  . Eszopiclone 3 MG TABS Take 1 tablet (3 mg total) by mouth at bedtime as needed (sleep). Take immediately before bedtime 30 tablet 5  . fexofenadine (ALLEGRA) 60 MG tablet Take 1 tablet daily as needed for allergies 90 tablet 1  . furosemide (LASIX) 40 MG tablet Take lasix 80 mg daily in the morning and 40 mg in the afternoons.  Do not take after 5 pm. 90 tablet 3  . gabapentin (NEURONTIN) 600 MG tablet Take 600 mg by mouth 4 (four) times daily. For pain or cramps    . HYDROcodone-acetaminophen (NORCO) 7.5-325 MG tablet Take 1 tablet by mouth every 6 (six) hours as needed for moderate pain. 30 tablet 0  . hyoscyamine (LEVSIN SL) 0.125 MG SL tablet Place 1 tablet (0.125 mg total) under the tongue every 6 (six) hours as needed. (Patient taking differently: Place 0.125 mg under the tongue every 6 (six) hours as needed for cramping. )  30 tablet 4  . KLOR-CON 10 10 MEQ tablet Take 10 mEq by mouth 2 (two) times daily.  3  . levothyroxine (SYNTHROID, LEVOTHROID) 100 MCG tablet Take 100 mcg by mouth daily before breakfast.     . Magnesium Hydroxide 400 MG CHEW Chew 3 tablets by mouth daily.    . meloxicam (MOBIC) 7.5 MG tablet Take 1 tablet (7.5 mg total) by mouth daily. 30 tablet 2  . mometasone (NASONEX) 50 MCG/ACT nasal spray Place 2 sprays into the nose daily. 17 g 2  . montelukast (SINGULAIR) 10 MG tablet Take 1 tablet (10 mg total) by mouth daily. 30 tablet 2  . morphine (MS CONTIN) 15 MG 12 hr tablet Take 1 tablet (15 mg total) by mouth every 12 (twelve) hours. 60 tablet 0  . Multiple Vitamin (MULITIVITAMIN WITH MINERALS) TABS Take 1 tablet by mouth every morning.     . ondansetron (ZOFRAN-ODT) 8  MG disintegrating tablet TAKE 1 TABLET BY MOUTH EVERY 8 HOURS AS NEEDED FOR NAUSEA AND VOMITING 30 tablet 1  . pantoprazole (PROTONIX) 40 MG tablet TAKE 1 TABLET (40 MG TOTAL) BY MOUTH 2 (TWO) TIMES DAILY. 60 tablet 5  . pomalidomide (POMALYST) 4 MG capsule Take 1 capsule (4 mg total) by mouth daily. Take with water on days 1-21. Repeat every 28 days.04/06/15 auth number [id number] to [facility] 21 capsule 0  . promethazine-dextromethorphan (PROMETHAZINE-DM) 6.25-15 MG/5ML syrup TAKE 5MLS BY MOUTH 4 TIMES A DAY AS NEEDED FOR COUGH 180 mL 0   No current facility-administered medications for this visit.    SURGICAL HISTORY:  Past Surgical History  Procedure Laterality Date  . Knee arthroscopy Right     "put pin in"  . Knee arthroscopy Right     "took pin out and corrected what was wrong"  . Video bronchoscopy  07/30/2011    Procedure: VIDEO BRONCHOSCOPY WITHOUT FLUORO;  Surgeon: Kathee Delton, MD;  Location: [facility];  Service: Cardiopulmonary;  Laterality: Bilateral;  . Cholecystectomy  1980's  . Portacath placement Right 2009  . Vaginal hysterectomy  1980's    REVIEW OF SYSTEMS:  Constitutional: negative Eyes:  negative Ears, nose, mouth, throat, and face: negative Respiratory: negative Cardiovascular: negative Gastrointestinal: negative Genitourinary:negative Integument/breast: negative Hematologic/lymphatic: negative Musculoskeletal:positive for back pain Neurological: negative Behavioral/Psych: negative   PHYSICAL EXAMINATION: General appearance: alert, cooperative, fatigued and no distress Head: Normocephalic, without obvious abnormality, atraumatic Neck: no adenopathy, no JVD, supple, symmetrical, trachea midline and thyroid not enlarged, symmetric, no tenderness/mass/nodules Lymph nodes: Cervical, supraclavicular, and axillary nodes normal. Resp: clear to auscultation bilaterally Back: symmetric, no curvature. ROM normal. No CVA tenderness. Cardio: regular rate and rhythm, S1, S2 normal, no murmur, click, rub or gallop GI: soft, non-tender; bowel sounds normal; no masses,  no organomegaly Extremities: edema 1+ edema of left lower extremity. Neurologic: Alert and oriented X 3, normal strength and tone. Normal symmetric reflexes. Normal coordination and gait  ECOG PERFORMANCE STATUS: 1 - Symptomatic but completely ambulatory  Blood pressure 103/77, pulse 77, temperature 97.5 F (36.4 C), resp. rate 20, height 5' 4"  (1.626 m), weight 198 lb 8 oz (90.039 kg), SpO2 100 %.  LABORATORY DATA: Lab Results  Component Value Date   WBC 2.4* 05/01/2015   HGB 10.5* 05/01/2015   HCT 32.2* 05/01/2015   MCV 96.5 05/01/2015   PLT 183 05/01/2015      Chemistry      Component Value Date/Time   NA 140 03/23/2015 1036   NA 141 03/14/2015 1111   K 3.7 03/23/2015 1036   K 3.8 03/14/2015 1111   CL 106 03/14/2015 1111   CL 105 12/17/2012 1015   CO2 27 03/23/2015 1036   CO2 25 03/14/2015 1111   BUN 9.1 03/23/2015 1036   BUN 12 03/14/2015 1111   CREATININE 0.9 03/23/2015 1036   CREATININE 0.68 03/14/2015 1111   CREATININE 0.71 01/13/2015 1559      Component Value Date/Time   CALCIUM 8.8  03/23/2015 1036   CALCIUM 8.0* 03/14/2015 1111   ALKPHOS 90 03/23/2015 1036   ALKPHOS 74 03/14/2015 1111   AST 9 03/23/2015 1036   AST 10 03/14/2015 1111   ALT 12 03/23/2015 1036   ALT 11 03/14/2015 1111   BILITOT 0.75 03/23/2015 1036   BILITOT 1.0 03/14/2015 1111      RADIOGRAPHIC STUDIES:  PET SCAN AT [facility] ON 04/25/2015:  MUSCULOSKELETAL:  - Intense FDG uptake is seen anterior  to the C1 to vertebral bodies as well as the C6-T1 vertebral bodies, possibly related to degenerative changes.  - Lytic lesions with areas of sclerosis are seen within the L4 and L5 vertebral bodies. Additional small lucencies are seen within the T3-T5 vertebral bodies. No definite FDG uptake is seen within these lesions. Mild FDG uptake at the L4 and L5 vertebral bodies is favored to be secondary to degenerative changes. There is mild to moderate diffuse marrow uptake, with the exception of the L3-S1 vertebral bodies, which demonstrate minimal to no metabolic activity.  - A linear focus of intense FDG uptake is seen along the lateral aspect of the left deltoid (CT 95), likely physiologic muscular activity or inflammation.    IMPRESSION:    No metabolically active disease.  -- Lytic lesions in the skull, spine, and pelvis without increased FDG activity.   ASSESSMENT AND PLAN:  This is a very pleasant 64 years old [place] American female with recurrent multiple myeloma recently completed a course of treatment with Velcade and Decadron with improvement in her disease but this was discontinued secondary to peripheral neuropathy. The patient tolerated her treatment with Carfilzomib and Decadron fairly well except for the recent shortness breath and cough which was felt to be secondary to congestive heart failure from her treatment with Carfilzomib. This treatment was discontinued. She was started on treatment with Pomalyst and Decadron status post 17 cycles. She tolerated the last cycle of her  treatment well. Her absolute neutrophil count is low today. I recommended for the patient to start cycle #18 until repeat her CBC today showed her absolute neutrophil count had recovered before starting the next cycle. She will come back for follow-up visit on 05/11/2015 for reevaluation before starting cycle #18. The patient will continue her current treatment with Zometa every 3 months as scheduled.  She was advised to call immediately if she has any concerning symptoms in the interval. The patient voices understanding of current disease status and treatment options and is in agreement with the current care plan.  All questions were answered. The patient knows to call the clinic with any problems, questions or concerns. We can certainly see the patient much sooner if necessary.  Disclaimer: This note was dictated with voice recognition software. Similar sounding words can inadvertently be transcribed and may be missed upon review.

## 2015-05-01 NOTE — Telephone Encounter (Signed)
Gave patient avs report and appointments for November.  °

## 2015-05-02 NOTE — Telephone Encounter (Signed)
Script was prepared 05-01-2015 with F/U appointment.

## 2015-05-05 ENCOUNTER — Encounter (HOSPITAL_COMMUNITY): Payer: Self-pay | Admitting: Emergency Medicine

## 2015-05-05 ENCOUNTER — Emergency Department (HOSPITAL_COMMUNITY)
Admission: EM | Admit: 2015-05-05 | Discharge: 2015-05-05 | Disposition: A | Discharge status: Home / Self Care | Payer: Medicare Other | Attending: Emergency Medicine | Admitting: Emergency Medicine

## 2015-05-05 ENCOUNTER — Other Ambulatory Visit: Payer: Self-pay | Admitting: *Deleted

## 2015-05-05 ENCOUNTER — Emergency Department (HOSPITAL_COMMUNITY): Payer: Medicare Other

## 2015-05-05 DIAGNOSIS — K5900 Constipation, unspecified: Secondary | ICD-10-CM | POA: Diagnosis not present

## 2015-05-05 DIAGNOSIS — Z7951 Long term (current) use of inhaled steroids: Secondary | ICD-10-CM | POA: Diagnosis not present

## 2015-05-05 DIAGNOSIS — Z79899 Other long term (current) drug therapy: Secondary | ICD-10-CM | POA: Diagnosis not present

## 2015-05-05 DIAGNOSIS — Z7952 Long term (current) use of systemic steroids: Secondary | ICD-10-CM | POA: Insufficient documentation

## 2015-05-05 DIAGNOSIS — K219 Gastro-esophageal reflux disease without esophagitis: Secondary | ICD-10-CM | POA: Insufficient documentation

## 2015-05-05 DIAGNOSIS — Z8701 Personal history of pneumonia (recurrent): Secondary | ICD-10-CM | POA: Diagnosis not present

## 2015-05-05 DIAGNOSIS — Z79891 Long term (current) use of opiate analgesic: Secondary | ICD-10-CM | POA: Diagnosis not present

## 2015-05-05 DIAGNOSIS — C9000 Multiple myeloma not having achieved remission: Secondary | ICD-10-CM

## 2015-05-05 DIAGNOSIS — C7951 Secondary malignant neoplasm of bone: Secondary | ICD-10-CM | POA: Diagnosis not present

## 2015-05-05 DIAGNOSIS — R531 Weakness: Secondary | ICD-10-CM | POA: Insufficient documentation

## 2015-05-05 DIAGNOSIS — E039 Hypothyroidism, unspecified: Secondary | ICD-10-CM | POA: Insufficient documentation

## 2015-05-05 DIAGNOSIS — Z791 Long term (current) use of non-steroidal anti-inflammatories (NSAID): Secondary | ICD-10-CM | POA: Diagnosis not present

## 2015-05-05 DIAGNOSIS — R42 Dizziness and giddiness: Secondary | ICD-10-CM | POA: Insufficient documentation

## 2015-05-05 DIAGNOSIS — R079 Chest pain, unspecified: Secondary | ICD-10-CM | POA: Diagnosis not present

## 2015-05-05 DIAGNOSIS — Z87891 Personal history of nicotine dependence: Secondary | ICD-10-CM | POA: Diagnosis not present

## 2015-05-05 DIAGNOSIS — G4700 Insomnia, unspecified: Secondary | ICD-10-CM | POA: Diagnosis not present

## 2015-05-05 DIAGNOSIS — R05 Cough: Secondary | ICD-10-CM | POA: Insufficient documentation

## 2015-05-05 DIAGNOSIS — Z8673 Personal history of transient ischemic attack (TIA), and cerebral infarction without residual deficits: Secondary | ICD-10-CM | POA: Diagnosis not present

## 2015-05-05 DIAGNOSIS — Z7982 Long term (current) use of aspirin: Secondary | ICD-10-CM | POA: Diagnosis not present

## 2015-05-05 DIAGNOSIS — R5383 Other fatigue: Secondary | ICD-10-CM | POA: Diagnosis present

## 2015-05-05 DIAGNOSIS — Z9484 Stem cells transplant status: Secondary | ICD-10-CM | POA: Diagnosis not present

## 2015-05-05 DIAGNOSIS — R0602 Shortness of breath: Secondary | ICD-10-CM | POA: Diagnosis not present

## 2015-05-05 DIAGNOSIS — Z8781 Personal history of (healed) traumatic fracture: Secondary | ICD-10-CM | POA: Insufficient documentation

## 2015-05-05 DIAGNOSIS — Z923 Personal history of irradiation: Secondary | ICD-10-CM | POA: Insufficient documentation

## 2015-05-05 LAB — URINALYSIS, ROUTINE W REFLEX MICROSCOPIC
Bilirubin Urine: NEGATIVE
Glucose, UA: NEGATIVE mg/dL
Hgb urine dipstick: NEGATIVE
Ketones, ur: NEGATIVE mg/dL
Nitrite: NEGATIVE
Protein, ur: NEGATIVE mg/dL
Specific Gravity, Urine: 1.018 (ref 1.005–1.030)
Urobilinogen, UA: 1 mg/dL (ref 0.0–1.0)
pH: 6.5 (ref 5.0–8.0)

## 2015-05-05 LAB — BASIC METABOLIC PANEL
Anion gap: 6 (ref 5–15)
BUN: 11 mg/dL (ref 6–20)
CO2: 27 mmol/L (ref 22–32)
Calcium: 8.5 mg/dL — ABNORMAL LOW (ref 8.9–10.3)
Chloride: 104 mmol/L (ref 101–111)
Creatinine, Ser: 0.82 mg/dL (ref 0.44–1.00)
GFR calc Af Amer: >60 mL/min (ref >60)
GFR calc non Af Amer: >60 mL/min (ref >60)
Glucose, Bld: 135 mg/dL — ABNORMAL HIGH (ref 65–99)
Potassium: 3.6 mmol/L (ref 3.5–5.1)
Sodium: 137 mmol/L (ref 135–145)

## 2015-05-05 LAB — DIFFERENTIAL
BASOS PCT: 1 %
Basophils Absolute: 0 10*3/uL (ref 0.0–0.1)
EOS ABS: 0.1 10*3/uL (ref 0.0–0.7)
EOS PCT: 8 %
LYMPHS ABS: 0.8 10*3/uL (ref 0.7–4.0)
Lymphocytes Relative: 41 %
MONO ABS: 0.3 10*3/uL (ref 0.1–1.0)
Monocytes Relative: 17 %
NEUTROS PCT: 33 %
Neutro Abs: 0.6 10*3/uL — ABNORMAL LOW (ref 1.7–7.7)

## 2015-05-05 LAB — CBC
HCT: 34 % — ABNORMAL LOW (ref 36.0–46.0)
Hemoglobin: 10.9 g/dL — ABNORMAL LOW (ref 12.0–15.0)
MCH: 31.4 pg (ref 26.0–34.0)
MCHC: 32.1 g/dL (ref 30.0–36.0)
MCV: 98 fL (ref 78.0–100.0)
Platelets: 187 10*3/uL (ref 150–400)
RBC: 3.47 MIL/uL — ABNORMAL LOW (ref 3.87–5.11)
RDW: 15.6 % — ABNORMAL HIGH (ref 11.5–15.5)
WBC: 1.8 10*3/uL — ABNORMAL LOW (ref 4.0–10.5)

## 2015-05-05 LAB — I-STAT TROPONIN, ED: Troponin i, poc: 0 ng/mL (ref 0.00–0.08)

## 2015-05-05 LAB — URINE MICROSCOPIC-ADD ON

## 2015-05-05 MED ORDER — POMALIDOMIDE 4 MG PO CAPS: 4.0000 mg | ORAL_CAPSULE | Freq: Every day | ORAL | Status: DC

## 2015-05-05 NOTE — ED Notes (Signed)
Per pt, states increase fatigue and pain-last chemo on Monday

## 2015-05-05 NOTE — ED Provider Notes (Signed)
CSN: [id number]     Arrival date & time 05/05/15  1327 History   First MD Initiated Contact with Patient 05/05/15 1510     Chief Complaint  Patient presents with  . pain/fatique      (Consider location/radiation/quality/duration/timing/severity/associated sxs/prior Treatment) HPI Comments: Patient with history of multiple myeloma currently on chemotherapy, monitored by Dr. Mora Appl.  Patient reports increasing weakness over the last several days with upper midsternal chest pain and shortness of breath.  Non-productive cough.  Patient states symptoms worsen with activity.  Review of history indicates patient with low neutrophil count with last cbc check on 05/01/15, with recommendation to delay next chemotherapy treatment.  Current therapy: 1.   Pomalyst 4 mg by mouth daily for 21 days every 4 weeks in addition to dexamethasone 40 mg on a weekly basis. First dose started 12/02/2013. Status post 17 cycles. Start cycle #18 on 05/11/2015. 2.   Zometa 4 mg IV given every 3 months   Patient is a 64 y.o. female presenting with weakness. The history is provided by the patient and medical records. No language interpreter was used.  Weakness This is a recurrent problem. The current episode started in the past 7 days. The problem occurs constantly. The problem has been gradually worsening. Associated symptoms include chest pain, coughing and weakness. Pertinent negatives include no fever.    Past Medical History  Diagnosis Date  . Hypercholesterolemia   . Compression fracture 04/08/2007    pathologic compression fracture  . Hypothyroidism   . FHx: chemotherapy     s/p 5 cycle revlimid/low dose decadron,s/p velcade,doxil,decadron,  . Hx of radiation therapy 05/05/07-05/18/07,& 03/05/11-03/21/11-    l3&l5 in 2008, t2-t6 03/2011  . GERD (gastroesophageal reflux disease)   . Insomnia     associated with steroids  . Constipation     takes oxycontin,vicodin  . Hx of radiation therapy 05/05/2007 to  05/18/2007    palliative, L3-5  . Hx of radiation therapy 03/05/2011 to 03/21/2011    palliative T2-T6, c-spine  . History of autologous stem cell transplant ([place]) 11/20/2007    [facility], Dr Valarie Merino  . PONV (postoperative nausea and vomiting)   . Multiple myeloma ([place]) dx'd 2009  . Metastasis to bone ([place])   . Family history of anesthesia complication     "daughter gets PONV too"  . Pneumonia     "several times"  . Stroke [facility]) 2014    denies residual on 01/27/2014   Past Surgical History  Procedure Laterality Date  . Knee arthroscopy Right     "put pin in"  . Knee arthroscopy Right     "took pin out and corrected what was wrong"  . Video bronchoscopy  07/30/2011    Procedure: VIDEO BRONCHOSCOPY WITHOUT FLUORO;  Surgeon: Kathee Delton, MD;  Location: [facility];  Service: Cardiopulmonary;  Laterality: Bilateral;  . Cholecystectomy  1980's  . Portacath placement Right 2009  . Vaginal hysterectomy  1980's   Family History  Problem Relation Age of Onset  . Lung cancer Brother   . Colon cancer Maternal Uncle   . Breast cancer Maternal Grandmother    Social History  Substance Use Topics  . Smoking status: Former Smoker -- 0.25 packs/day for 6 years    Types: Cigarettes    Quit date: 08/27/1978  . Smokeless tobacco: Never Used  . Alcohol Use: No     Comment: "quit drinking in the 1980's", previously drank on the weekend   OB History    No data  available     Review of Systems  Constitutional: Negative for fever.  Respiratory: Positive for cough.   Cardiovascular: Positive for chest pain.  Neurological: Positive for dizziness and weakness.  All other systems reviewed and are negative.     Allergies  Review of patient's allergies indicates no known allergies.  Home Medications   Prior to Admission medications   Medication Sig Start Date End Date Taking? Authorizing Provider  ALPRAZolam Duanne Moron) 0.5 MG tablet Take 1/2 to 1 tablet 2 to 3 x daily as needed for  nerves Patient taking differently: Take 0.5 mg by mouth 3 (three) times daily as needed.  11/13/14  Yes Unk Pinto, MD  aspirin 81 MG tablet Take 81 mg by mouth daily.   Yes Historical Provider, MD  citalopram (CELEXA) 40 MG tablet Take 1 tablet (40 mg total) by mouth daily. Patient taking differently: Take 20 mg by mouth daily.  03/14/15  Yes Courtney Forcucci, [name]  cyclobenzaprine (FLEXERIL) 10 MG tablet TAKE 1/2 TO 1 TABLET BY MOUTH THREE TIMES DAILY FOR MUSCLE SPASMS 01/15/15  Yes Unk Pinto, MD  dexamethasone (DECADRON) 4 MG tablet TAKE (10) TABLETS BY MOUTH EVERY FRIDAY 04/01/15  Yes Curt Bears, MD  diazepam (VALIUM) 10 MG tablet Take 1 tablet (10 mg total) by mouth every 6 (six) hours as needed for anxiety. 03/14/15  Yes Courtney Forcucci, [name]  diphenoxylate-atropine (LOMOTIL) 2.5-0.025 MG per tablet Take 2 tablets by mouth 4 (four) times daily as needed for diarrhea or loose stools. 02/13/15  Yes Unk Pinto, MD  Eszopiclone 3 MG TABS TAKE 1 TABLET BY MOUTH AT BEDTIME AS NEEDED FOR SLEEP 05/01/15  Yes Unk Pinto, MD  fexofenadine (ALLEGRA) 60 MG tablet Take 1 tablet daily as needed for allergies Patient taking differently: Take 60 mg by mouth daily.  02/28/15 08/29/15 Yes Unk Pinto, MD  furosemide (LASIX) 40 MG tablet Take lasix 80 mg daily in the morning and 40 mg in the afternoons.  Do not take after 5 pm. Patient taking differently: Take 40-80 mg by mouth 2 (two) times daily. Take lasix 80 mg daily in the morning and 40 mg in the afternoons.  Do not take after 5 pm. 03/30/15  Yes Courtney Forcucci, [name]  gabapentin (NEURONTIN) 600 MG tablet Take 600 mg by mouth 4 (four) times daily as needed (pain). For pain or cramps   Yes Historical Provider, MD  HYDROcodone-acetaminophen (NORCO) 7.5-325 MG tablet Take 1 tablet by mouth every 6 (six) hours as needed for moderate pain. 05/01/15  Yes Curt Bears, MD  hyoscyamine (LEVSIN SL) 0.125 MG SL tablet Place 1 tablet  (0.125 mg total) under the tongue every 6 (six) hours as needed. Patient taking differently: Place 0.125 mg under the tongue every 6 (six) hours as needed for cramping.  04/11/14  Yes Willia Craze, NP  KLOR-CON 10 10 MEQ tablet Take 10 mEq by mouth 2 (two) times daily. 12/05/14  Yes Historical Provider, MD  levothyroxine (SYNTHROID, LEVOTHROID) 100 MCG tablet Take 100 mcg by mouth daily before breakfast.    Yes Historical Provider, MD  Magnesium Hydroxide 400 MG CHEW Chew 2 tablets by mouth daily.    Yes Historical Provider, MD  meloxicam (MOBIC) 7.5 MG tablet Take 1 tablet (7.5 mg total) by mouth daily. 03/30/15 03/29/16 Yes Courtney Forcucci, [name]  mometasone (NASONEX) 50 MCG/ACT nasal spray Place 2 sprays into the nose daily. 03/30/15 03/29/16 Yes Courtney Forcucci, [name]  montelukast (SINGULAIR) 10 MG tablet Take 1 tablet (10 mg  total) by mouth daily. 03/30/15 03/29/16 Yes Courtney Forcucci, [name]  morphine (MS CONTIN) 15 MG 12 hr tablet Take 1 tablet (15 mg total) by mouth every 12 (twelve) hours. 04/20/15  Yes Curt Bears, MD  Multiple Vitamin (MULITIVITAMIN WITH MINERALS) TABS Take 1 tablet by mouth every morning.    Yes Historical Provider, MD  ondansetron (ZOFRAN-ODT) 8 MG disintegrating tablet TAKE 1 TABLET BY MOUTH EVERY 8 HOURS AS NEEDED FOR NAUSEA AND VOMITING 02/23/15  Yes Unk Pinto, MD  pantoprazole (PROTONIX) 40 MG tablet TAKE 1 TABLET (40 MG TOTAL) BY MOUTH 2 (TWO) TIMES DAILY. 03/10/15  Yes Vicie Mutters, [name]  pomalidomide (POMALYST) 4 MG capsule Take 1 capsule (4 mg total) by mouth daily. Take with water on days 1-21. Repeat every 28 days. 05/05/15  Yes Curt Bears, MD  promethazine-dextromethorphan (PROMETHAZINE-DM) 6.25-15 MG/5ML syrup TAKE 5MLS BY MOUTH 4 TIMES A DAY AS NEEDED FOR COUGH 01/09/15  Yes Vicie Mutters, [name]   BP 120/58 mmHg  Pulse 64  Temp(Src) 98.3 F (36.8 C) (Oral)  Resp 18  SpO2 99% Physical Exam  Constitutional: She is oriented to person, place, and  time. She appears well-developed and well-nourished. No distress.  HENT:  Head: Normocephalic.  Eyes: Conjunctivae are normal.  Neck: Neck supple.  Cardiovascular: Normal rate and regular rhythm.   Pulmonary/Chest: Effort normal and breath sounds normal.  Abdominal: Soft.  Musculoskeletal: Normal range of motion.  Neurological: She is alert and oriented to person, place, and time.  Skin: Skin is warm and dry.  Psychiatric: She has a normal mood and affect.  Nursing note and vitals reviewed.   ED Course  Procedures (including critical care time) Labs Review Labs Reviewed  BASIC METABOLIC PANEL - Abnormal; Notable for the following:    Glucose, Bld 135 (*)    Calcium 8.5 (*)    All other components within normal limits  CBC - Abnormal; Notable for the following:    WBC 1.8 (*)    RBC 3.47 (*)    Hemoglobin 10.9 (*)    HCT 34.0 (*)    RDW 15.6 (*)    All other components within normal limits  URINALYSIS, ROUTINE W REFLEX MICROSCOPIC (NOT AT [facility])  DIFFERENTIAL    Imaging Review Dg Chest 2 View  05/05/2015  CLINICAL DATA:  Chest pain. Shortness of breath and dizziness. Cough. EXAM: CHEST  2 VIEW COMPARISON:  11/09/2014 FINDINGS: There is a right chest wall port a catheter with tip in the cavoatrial junction. Heart size is normal. No pleural effusion or edema. No airspace consolidation identified. IMPRESSION: 1. No acute cardiopulmonary abnormalities. Electronically Signed   By: Kerby Moors M.D.   On: 05/05/2015 16:06   I have personally reviewed and evaluated these images and lab results as part of my medical decision-making.   EKG Interpretation None      Lab and radiology results reviewed and shared with patient. Patient discussed with Dr. Wilson Singer. Stable anemia.  Continued low abs neutrophil. Most of current symptoms are in the side effect profile of pomalyst. Chest pain seems to be musculoskeletal in nature.  Normal troponin. No ischemic changes on ECG. Non-productive  cough. No pneumonia on xray. Afebrile.  No current indication of sepsis.    MDM   Final diagnoses:  None    Fatigue. Follow-up with oncologist. Strict return precautions discussed.    Etta Quill, NP 05/06/15 9449  Virgel Manifold, MD 05/07/15 [phone]

## 2015-05-05 NOTE — Discharge Instructions (Signed)

## 2015-05-05 NOTE — ED Notes (Signed)
Patient transported to X-ray 

## 2015-05-05 NOTE — Telephone Encounter (Signed)
[facility] refill request for pomalyst. Survey completed, authorization [id number] [phone]

## 2015-05-08 ENCOUNTER — Other Ambulatory Visit: Payer: Self-pay | Admitting: *Deleted

## 2015-05-08 DIAGNOSIS — C9000 Multiple myeloma not having achieved remission: Secondary | ICD-10-CM

## 2015-05-08 LAB — PATHOLOGIST SMEAR REVIEW

## 2015-05-08 MED ORDER — POMALIDOMIDE 4 MG PO CAPS: 4.0000 mg | ORAL_CAPSULE | Freq: Every day | ORAL | Status: DC

## 2015-05-08 NOTE — Telephone Encounter (Signed)
Pomalyst Rx resent in epic and refaxed to [facility].

## 2015-05-09 ENCOUNTER — Telehealth: Payer: Self-pay | Admitting: *Deleted

## 2015-05-09 NOTE — Telephone Encounter (Signed)
Call from [facility] in [place] regarding Pomalyst refill, pt advised [facility] she was not taking this anymore. Reviewed with MD who is not aware of any changes in pt treatment. Pt is also seen at [facility]. Call to pt to inquire, unable to reach. LMOVM for pt to call office.

## 2015-05-11 ENCOUNTER — Other Ambulatory Visit (HOSPITAL_BASED_OUTPATIENT_CLINIC_OR_DEPARTMENT_OTHER): Payer: Medicare Other

## 2015-05-11 ENCOUNTER — Telehealth: Payer: Self-pay | Admitting: *Deleted

## 2015-05-11 ENCOUNTER — Telehealth: Payer: Self-pay | Admitting: Internal Medicine

## 2015-05-11 ENCOUNTER — Ambulatory Visit (HOSPITAL_BASED_OUTPATIENT_CLINIC_OR_DEPARTMENT_OTHER): Payer: Medicare Other

## 2015-05-11 ENCOUNTER — Ambulatory Visit (HOSPITAL_BASED_OUTPATIENT_CLINIC_OR_DEPARTMENT_OTHER): Payer: Medicare Other | Admitting: Physician Assistant

## 2015-05-11 ENCOUNTER — Telehealth: Payer: Self-pay

## 2015-05-11 VITALS — BP 110/65 | HR 58 | Temp 98.1°F | Resp 18 | Ht 64.0 in | Wt 194.5 lb

## 2015-05-11 DIAGNOSIS — C9000 Multiple myeloma not having achieved remission: Secondary | ICD-10-CM

## 2015-05-11 DIAGNOSIS — C9002 Multiple myeloma in relapse: Secondary | ICD-10-CM

## 2015-05-11 LAB — COMPREHENSIVE METABOLIC PANEL (CC13)
ALT: 14 U/L (ref 0–55)
ANION GAP: 7 meq/L (ref 3–11)
AST: 15 U/L (ref 5–34)
Albumin: 3.2 g/dL — ABNORMAL LOW (ref 3.5–5.0)
Alkaline Phosphatase: 65 U/L (ref 40–150)
BILIRUBIN TOTAL: 0.97 mg/dL (ref 0.20–1.20)
BUN: 12.9 mg/dL (ref 7.0–26.0)
CALCIUM: 9.2 mg/dL (ref 8.4–10.4)
CHLORIDE: 103 meq/L (ref 98–109)
CO2: 28 mEq/L (ref 22–29)
CREATININE: 1 mg/dL (ref 0.6–1.1)
EGFR: 72 mL/min/{1.73_m2} — AB (ref >90)
Glucose: 80 mg/dl (ref 70–140)
Potassium: 3.3 mEq/L — ABNORMAL LOW (ref 3.5–5.1)
Sodium: 138 mEq/L (ref 136–145)
Total Protein: 6.3 g/dL — ABNORMAL LOW (ref 6.4–8.3)

## 2015-05-11 LAB — CBC WITH DIFFERENTIAL/PLATELET
BASO%: 2.5 % — AB (ref 0.0–2.0)
BASOS ABS: 0.1 10*3/uL (ref 0.0–0.1)
EOS ABS: 0.1 10*3/uL (ref 0.0–0.5)
EOS%: 2.2 % (ref 0.0–7.0)
HEMATOCRIT: 34 % — AB (ref 34.8–46.6)
HGB: 11 g/dL — ABNORMAL LOW (ref 11.6–15.9)
LYMPH#: 1.2 10*3/uL (ref 0.9–3.3)
LYMPH%: 36.3 % (ref 14.0–49.7)
MCH: 31.4 pg (ref 25.1–34.0)
MCHC: 32.4 g/dL (ref 31.5–36.0)
MCV: 97.1 fL (ref 79.5–101.0)
MONO#: 0.6 10*3/uL (ref 0.1–0.9)
MONO%: 17.7 % — ABNORMAL HIGH (ref 0.0–14.0)
NEUT#: 1.3 10*3/uL — ABNORMAL LOW (ref 1.5–6.5)
NEUT%: 41.3 % (ref 38.4–76.8)
PLATELETS: 216 10*3/uL (ref 145–400)
RBC: 3.5 10*6/uL — ABNORMAL LOW (ref 3.70–5.45)
RDW: 15.7 % — ABNORMAL HIGH (ref 11.2–14.5)
WBC: 3.2 10*3/uL — ABNORMAL LOW (ref 3.9–10.3)

## 2015-05-11 MED ORDER — ZOLEDRONIC ACID 4 MG/100ML IV SOLN
4.0000 mg | Freq: Once | INTRAVENOUS | Status: AC
  Administered 2015-05-11: 4 mg via INTRAVENOUS
  Filled 2015-05-11: qty 100

## 2015-05-11 MED ORDER — SODIUM CHLORIDE 0.9 % IJ SOLN
10.0000 mL | INTRAMUSCULAR | Status: DC | PRN
  Administered 2015-05-11: 10 mL via INTRAVENOUS
  Filled 2015-05-11: qty 10

## 2015-05-11 MED ORDER — HYDROCODONE-ACETAMINOPHEN 7.5-325 MG PO TABS: 1.0000 | ORAL_TABLET | Freq: Four times a day (QID) | ORAL | Status: DC | PRN

## 2015-05-11 MED ORDER — SODIUM CHLORIDE 0.9 % IV SOLN
INTRAVENOUS | Status: DC
  Administered 2015-05-11: 14:00:00 via INTRAVENOUS

## 2015-05-11 MED ORDER — HEPARIN SOD (PORK) LOCK FLUSH 100 UNIT/ML IV SOLN
500.0000 [IU] | Freq: Once | INTRAVENOUS | Status: AC
Start: 2015-05-11 — End: 2015-05-11
  Administered 2015-05-11: 500 [IU] via INTRAVENOUS
  Filled 2015-05-11: qty 5

## 2015-05-11 NOTE — Telephone Encounter (Signed)
Spoke with Hinton Dyer RN navigator at [facility] requesting pt have cortisol level test. Reviewed with MD and [place].Our protocol is to give dose of dexamethasone at 11pm check labs at [facility] advised he will send protocol and if unable to perform at this facility he will make arrangements for pt to come to [facility] [phone]) advised [facility] MD has not made any changes to pt's Pomalyst.

## 2015-05-11 NOTE — Progress Notes (Signed)
[place] [phone]   Fax:([phone]) [place], [place] [address]  DIAGNOSIS: Recurrent multiple myeloma initially diagnosed in October 2008.   PRIOR THERAPY:  1. Status post palliative radiotherapy to the lumbar spine between L3 and L5. The patient received a total dose of 3000 cGy in 10 fractions under the care of Dr. Lisbeth Renshaw between May 05, 2007 through May 18, 2007. 2. Status post 5 cycles of systemic chemotherapy with Revlimid and low-dose Decadron with good response to this treatment. 3. Status post autologous peripheral blood stem cell transplant at [facility] on Nov 20, 2007 under the care of Dr. Valarie Merino. 4. Status post treatment for disease recurrence with Velcade, Doxil and Decadron. Last dose given Nov 09, 2009. Discontinued secondary to intolerance but the patient had a good response to treatment at that time. 5. Status post palliative radiotherapy to the T2-T6 thoracic vertebrae completed 03/21/2011 under the care of Dr. Lisbeth Renshaw. 6. Systemic chemotherapy with Velcade 1.3 mg per meter squared given on days 1, 4, 8 and 11, and Doxil at 30 mg per meter squared given on day 4 in addition to Decadron status post 4 cycles, discontinued secondary to intolerance. 7. Systemic therapy with Velcade 1.3 mg/M2 subcutaneously in addition to Decadron 40 mg by mouth on a weekly basis, status post 20 cycles. The patient had good response with this treatment but it is discontinue today secondary to worsening peripheral neuropathy. 8. Palliative radiotherapy to the skull lesion as well as the left hip area under the care of Dr. Lisbeth Renshaw. 9. Systemic chemotherapy with Carfilzomib 20 mg/M2 on days 1, 2,  8, 9, 13 and 16 every 4 weeks in addition to weekly Decadron 40 mg by mouth. First dose on 04/19/2013. Status post 4 cycles, discontinued recently secondary to cardiac  dysfunction.  CURRENT THERAPY:  1. Pomalyst 4 mg by mouth daily for 21 days every 4 weeks in addition to dexamethasone 40 mg on a weekly basis. First dose started 12/02/2013. Status post 17 cycles. She started cycle #18 on 05/11/2015. 2. Zometa 4 mg IV given every 3 months, last dose on 12/15/14   INTERVAL HISTORY: AUDERY [name] 64 y.o. female returns to the clinic today for follow up visit accompanied by her daughter and sister. The patient is feeling fine today with no specific complaints except for intermittent epigastric pain as well as diarrhea. She takes Imodium for diarrhea. She is tolerating her current treatment with Pomalyst fairly well with no significant adverse effects except for fatigue and weakness. She was seen at the ED 1 week ago due to chest pain and malaise, but no acute changes were found, she was discharged home the same day. The patient denied having any significant chest pain, shortness of breath, or hemoptysis. She has no nausea or vomiting. She has no weight loss or night sweats. She continues to have mild peripheral neuropathy and currently on gabapentin. She was seen recently at [facility] by Dr. Evelene Croon and had a PET scan performed on 10/25/201 which was essentially negative for acute findings  MEDICAL HISTORY: Past Medical History  Diagnosis Date  . Hypercholesterolemia   . Compression fracture 04/08/2007    pathologic compression fracture  . Hypothyroidism   . FHx: chemotherapy     s/p 5 cycle revlimid/low dose decadron,s/p velcade,doxil,decadron,  . Hx of radiation therapy 05/05/07-05/18/07,& 03/05/11-03/21/11-    l3&l5 in 2008, t2-t6 03/2011  .  GERD (gastroesophageal reflux disease)   . Insomnia     associated with steroids  . Constipation     takes oxycontin,vicodin  . Hx of radiation therapy 05/05/2007 to 05/18/2007    palliative, L3-5  . Hx of radiation therapy 03/05/2011 to 03/21/2011    palliative T2-T6, c-spine  . History of autologous stem cell  transplant ([place]) 11/20/2007    [facility], Dr Valarie Merino  . PONV (postoperative nausea and vomiting)   . Multiple myeloma ([place]) dx'd 2009  . Metastasis to bone ([place])   . Family history of anesthesia complication     "daughter gets PONV too"  . Pneumonia     "several times"  . Stroke [facility]) 2014    denies residual on 01/27/2014    ALLERGIES:  has No Known Allergies.  MEDICATIONS:  Current Outpatient Prescriptions  Medication Sig Dispense Refill  . ALPRAZolam (XANAX) 0.5 MG tablet Take 1/2 to 1 tablet 2 to 3 x daily as needed for nerves (Patient taking differently: Take 0.5 mg by mouth 3 (three) times daily as needed. ) 90 tablet 5  . aspirin 81 MG tablet Take 81 mg by mouth daily.    . citalopram (CELEXA) 40 MG tablet Take 1 tablet (40 mg total) by mouth daily. (Patient taking differently: Take 20 mg by mouth daily. ) 90 tablet 2  . cyclobenzaprine (FLEXERIL) 10 MG tablet TAKE 1/2 TO 1 TABLET BY MOUTH THREE TIMES DAILY FOR MUSCLE SPASMS 90 tablet 2  . dexamethasone (DECADRON) 4 MG tablet TAKE (10) TABLETS BY MOUTH EVERY FRIDAY 40 tablet 1  . diazepam (VALIUM) 10 MG tablet Take 1 tablet (10 mg total) by mouth every 6 (six) hours as needed for anxiety. 30 tablet 2  . diphenoxylate-atropine (LOMOTIL) 2.5-0.025 MG per tablet Take 2 tablets by mouth 4 (four) times daily as needed for diarrhea or loose stools. 30 tablet 1  . Eszopiclone 3 MG TABS TAKE 1 TABLET BY MOUTH AT BEDTIME AS NEEDED FOR SLEEP 30 tablet 2  . fexofenadine (ALLEGRA) 60 MG tablet Take 1 tablet daily as needed for allergies (Patient taking differently: Take 60 mg by mouth daily. ) 90 tablet 1  . furosemide (LASIX) 40 MG tablet Take lasix 80 mg daily in the morning and 40 mg in the afternoons.  Do not take after 5 pm. (Patient taking differently: Take 40-80 mg by mouth 2 (two) times daily. Take lasix 80 mg daily in the morning and 40 mg in the afternoons.  Do not take after 5 pm.) 90 tablet 3  . gabapentin (NEURONTIN) 600 MG tablet Take 600  mg by mouth 4 (four) times daily as needed (pain). For pain or cramps    . HYDROcodone-acetaminophen (NORCO) 7.5-325 MG tablet Take 1 tablet by mouth every 6 (six) hours as needed for moderate pain. 30 tablet 0  . hyoscyamine (LEVSIN SL) 0.125 MG SL tablet Place 1 tablet (0.125 mg total) under the tongue every 6 (six) hours as needed. (Patient taking differently: Place 0.125 mg under the tongue every 6 (six) hours as needed for cramping. ) 30 tablet 4  . KLOR-CON 10 10 MEQ tablet Take 10 mEq by mouth 2 (two) times daily.  3  . levothyroxine (SYNTHROID, LEVOTHROID) 100 MCG tablet Take 100 mcg by mouth daily before breakfast.     . Magnesium Hydroxide 400 MG CHEW Chew 2 tablets by mouth daily.     . meloxicam (MOBIC) 7.5 MG tablet Take 1 tablet (7.5 mg total) by mouth daily. [place]  tablet 2  . mometasone (NASONEX) 50 MCG/ACT nasal spray Place 2 sprays into the nose daily. 17 g 2  . montelukast (SINGULAIR) 10 MG tablet Take 1 tablet (10 mg total) by mouth daily. 30 tablet 2  . morphine (MS CONTIN) 15 MG 12 hr tablet Take 1 tablet (15 mg total) by mouth every 12 (twelve) hours. 60 tablet 0  . Multiple Vitamin (MULITIVITAMIN WITH MINERALS) TABS Take 1 tablet by mouth every morning.     . ondansetron (ZOFRAN-ODT) 8 MG disintegrating tablet TAKE 1 TABLET BY MOUTH EVERY 8 HOURS AS NEEDED FOR NAUSEA AND VOMITING 30 tablet 1  . pantoprazole (PROTONIX) 40 MG tablet TAKE 1 TABLET (40 MG TOTAL) BY MOUTH 2 (TWO) TIMES DAILY. 60 tablet 5  . pomalidomide (POMALYST) 4 MG capsule Take 1 capsule (4 mg total) by mouth daily. Take with water on days 1-21. Repeat every 28 days. 21 capsule 0  . promethazine-dextromethorphan (PROMETHAZINE-DM) 6.25-15 MG/5ML syrup TAKE 5MLS BY MOUTH 4 TIMES A DAY AS NEEDED FOR COUGH 180 mL 0   No current facility-administered medications for this visit.    SURGICAL HISTORY:  Past Surgical History  Procedure Laterality Date  . Knee arthroscopy Right     "put pin in"  . Knee arthroscopy  Right     "took pin out and corrected what was wrong"  . Video bronchoscopy  07/30/2011    Procedure: VIDEO BRONCHOSCOPY WITHOUT FLUORO;  Surgeon: Kathee Delton, MD;  Location: [facility];  Service: Cardiopulmonary;  Laterality: Bilateral;  . Cholecystectomy  1980's  . Portacath placement Right 2009  . Vaginal hysterectomy  1980's    REVIEW OF SYSTEMS:  Constitutional: negative Eyes: negative Ears, nose, mouth, throat, and face: negative Respiratory: negative Cardiovascular: negative Gastrointestinal: negative Genitourinary:negative Integument/breast: negative Hematologic/lymphatic: negative Musculoskeletal:positive for back pain Neurological: negative Behavioral/Psych: negative   PHYSICAL EXAMINATION: General appearance: alert, cooperative, fatigued and no distress Head: Normocephalic, without obvious abnormality, atraumatic Neck: no adenopathy, no JVD, supple, symmetrical, trachea midline and thyroid not enlarged, symmetric, no tenderness/mass/nodules Lymph nodes: Cervical, supraclavicular, and axillary nodes normal. Resp: clear to auscultation bilaterally Back: symmetric, no curvature. ROM normal. No CVA tenderness. Cardio: regular rate and rhythm, S1, S2 normal, no murmur, click, rub or gallop GI: soft, non-tender; bowel sounds normal; no masses,  no organomegaly Extremities: edema 1+ edema of left lower extremity. Neurologic: Alert and oriented X 3, normal strength and tone. Normal symmetric reflexes. Normal coordination and gait  ECOG PERFORMANCE STATUS: 1 - Symptomatic but completely ambulatory  Blood pressure 110/65, pulse 58, temperature 98.1 F (36.7 C), temperature source Oral, resp. rate 18, height 5' 4"  (1.626 m), weight 194 lb 8 oz (88.225 kg), SpO2 100 %.  LABORATORY DATA: CBC Latest Ref Rng 05/11/2015 05/05/2015 05/01/2015  WBC 3.9 - 10.3 10e3/uL 3.2(L) 1.8(L) 2.4(L)  Hemoglobin 11.6 - 15.9 g/dL 11.0(L) 10.9(L) 10.5(L)  Hematocrit 34.8 - 46.6 % 34.0(L)  34.0(L) 32.2(L)  Platelets 145 - 400 10e3/uL 216 187 183   CMP Latest Ref Rng 05/05/2015 05/01/2015 03/23/2015  Glucose 65 - 99 mg/dL 135(H) 107 111  BUN 6 - 20 mg/dL 11 10.3 9.1  Creatinine 0.44 - 1.00 mg/dL 0.82 0.8 0.9  Sodium 135 - 145 mmol/L 137 144 140  Potassium 3.5 - 5.1 mmol/L 3.6 3.4(L) 3.7  Chloride 101 - 111 mmol/L 104 - -  CO2 22 - 32 mmol/L 27 29 27   Calcium 8.9 - 10.3 mg/dL 8.5(L) 8.5 8.8  Total Protein 6.4 - 8.3 g/dL -  5.9(L) 5.8(L)  Total Bilirubin 0.20 - 1.20 mg/dL - 0.78 0.75  Alkaline Phos 40 - 150 U/L - 62 90  AST 5 - 34 U/L - 8 9  ALT 0 - 55 U/L - 10 12    RADIOGRAPHIC STUDIES:  PET SCAN AT [facility] [place] ON 04/25/2015:  MUSCULOSKELETAL:  - Intense FDG uptake is seen anterior to the C1 to vertebral bodies as well as the C6-T1 vertebral bodies, possibly related to degenerative changes.  - Lytic lesions with areas of sclerosis are seen within the L4 and L5 vertebral bodies. Additional small lucencies are seen within the T3-T5 vertebral bodies. No definite FDG uptake is seen within these lesions. Mild FDG uptake at the L4 and L5 vertebral bodies is favored to be secondary to degenerative changes. There is mild to moderate diffuse marrow uptake, with the exception of the L3-S1 vertebral bodies, which demonstrate minimal to no metabolic activity.  - A linear focus of intense FDG uptake is seen along the lateral aspect of the left deltoid (CT 95), likely physiologic muscular activity or inflammation.    IMPRESSION:    No metabolically active disease.  -- Lytic lesions in the skull, spine, and pelvis without increased FDG activity.   ASSESSMENT AND PLAN:  This is a very pleasant 64 years old [place] American female with recurrent multiple myeloma recently completed a course of treatment with Velcade and Decadron with improvement in her disease but this was discontinued secondary to peripheral neuropathy. The patient tolerated her treatment with Carfilzomib and  Decadron fairly well except for the recent shortness breath and cough which was felt to be secondary to congestive heart failure from her treatment with Carfilzomib. This treatment was discontinued. She was started on treatment with Pomalyst and Decadron status post 17 cycles. She tolerated the last cycle of her treatment well. Her absolute neutrophil count was low as of 10/31 for which her treatment was delayed 1 week. Will proceed with cycle #18 as her counts are now adequate She will come back for follow-up visit before starting cycle #19. She was non compliant with Zometa. Her last dose was in June of this year. Will proceed with a dose of Zometa today. 05/11/15 The patient will continue her current treatment with Zometa every 3 months as scheduled.  She will return in 1 month. She will have her protein studies in 3 weeks, as last ones were performed in 01/2015. She was advised to call immediately if she has any concerning symptoms in the interval. The patient voices understanding of current disease status and treatment options and is in agreement with the current care plan.  All questions were answered. The patient knows to call the clinic with any problems, questions or concerns. We can certainly see the patient much sooner if necessary.   ADDENDUM: Hematology/Oncology Attending: I had a face to face encounter with the patient. I recommended her care plan. This is a very pleasant 64 years old African-American female with relapsed multiple myeloma status post several chemotherapy regimens and she is currently on treatment with Pomalyst and Decadron status post 17 cycles and has been tolerating her treatment fairly well with no significant adverse effects. The start of cycle #18 was delayed by one week secondary to low neutrophil count. His absolute neutrophil count is better today. I recommended for the patient to proceed with her treatment as a scheduled. She will also resume her treatment with  Zometa every 3 months. The patient would come back for follow-up visit  in one month's for reevaluation after repeating myeloma panel for reevaluation of her disease. She was advised to call immediately if she has any concerning symptoms in the interval.  Disclaimer: This note was dictated with voice recognition software. Similar sounding words can inadvertently be transcribed and may be missed upon review. Eilleen Kempf., MD 05/13/2015

## 2015-05-11 NOTE — Patient Instructions (Signed)

## 2015-05-11 NOTE — Telephone Encounter (Signed)
[facility] Needed prior authorization before medication was given. Received call back from [place] stating. Pt. Was Ok to receive medication.

## 2015-05-11 NOTE — Telephone Encounter (Signed)
Gave patient avs report and appointments for December. Lab schedued one wk prior to f/u and next 3 month zometa will be scheduled at 12/12 office visit.

## 2015-05-15 ENCOUNTER — Telehealth: Payer: Self-pay | Admitting: *Deleted

## 2015-05-15 ENCOUNTER — Telehealth: Payer: Self-pay | Admitting: Medical Oncology

## 2015-05-15 NOTE — Telephone Encounter (Signed)
I called [facility]olog[facility] and Pomalyst scheduled to ship today.Pt notified.

## 2015-05-15 NOTE — Telephone Encounter (Signed)
Call received in [place] from pt stating she received call from [facility] with Dr Delene Ruffini-  Pt states she was told to start the pomalyst " but I haven't received it yet " .  Pt obtains medication per [facility]- noted refilled last week per [facility].  This RN inquired with pt if she has done her on line survey- pt was unclear stating " I was told to hold my medication because my counts were too low".  Per above - inquiry will be sent to Dr Worthy Flank nurse per refill request and when to restart.  Best number for pt return call given as [phone].

## 2015-05-16 NOTE — Telephone Encounter (Signed)
No fax received

## 2015-05-18 ENCOUNTER — Other Ambulatory Visit: Payer: Self-pay | Admitting: Physician Assistant

## 2015-05-22 ENCOUNTER — Other Ambulatory Visit: Payer: Self-pay | Admitting: Internal Medicine

## 2015-05-26 ENCOUNTER — Telehealth: Payer: Self-pay

## 2015-05-26 ENCOUNTER — Other Ambulatory Visit: Payer: Self-pay | Admitting: Medical Oncology

## 2015-05-26 DIAGNOSIS — C9000 Multiple myeloma not having achieved remission: Secondary | ICD-10-CM

## 2015-05-26 MED ORDER — MORPHINE SULFATE ER 15 MG PO TBCR: 15.0000 mg | EXTENDED_RELEASE_TABLET | Freq: Two times a day (BID) | ORAL | Status: DC

## 2015-05-26 NOTE — Telephone Encounter (Signed)
Patient is calling to have MS Contin refilled.  She took the last one today.

## 2015-05-26 NOTE — Progress Notes (Signed)
RX ready for pick up -pt notified.

## 2015-05-30 ENCOUNTER — Other Ambulatory Visit: Payer: Self-pay | Admitting: Medical Oncology

## 2015-05-30 DIAGNOSIS — C9000 Multiple myeloma not having achieved remission: Secondary | ICD-10-CM

## 2015-05-30 MED ORDER — POMALIDOMIDE 4 MG PO CAPS: 4.0000 mg | ORAL_CAPSULE | Freq: Every day | ORAL | Status: DC

## 2015-06-05 ENCOUNTER — Other Ambulatory Visit (HOSPITAL_BASED_OUTPATIENT_CLINIC_OR_DEPARTMENT_OTHER): Payer: Medicare Other

## 2015-06-05 DIAGNOSIS — C9002 Multiple myeloma in relapse: Secondary | ICD-10-CM | POA: Diagnosis not present

## 2015-06-05 DIAGNOSIS — C9000 Multiple myeloma not having achieved remission: Secondary | ICD-10-CM | POA: Diagnosis not present

## 2015-06-05 LAB — CBC WITH DIFFERENTIAL/PLATELET
BASO%: 0.5 % (ref 0.0–2.0)
Basophils Absolute: 0 10*3/uL (ref 0.0–0.1)
EOS%: 8.5 % — AB (ref 0.0–7.0)
Eosinophils Absolute: 0.2 10*3/uL (ref 0.0–0.5)
HCT: 33 % — ABNORMAL LOW (ref 34.8–46.6)
HGB: 10.6 g/dL — ABNORMAL LOW (ref 11.6–15.9)
LYMPH%: 25.9 % (ref 14.0–49.7)
MCH: 31.6 pg (ref 25.1–34.0)
MCHC: 32 g/dL (ref 31.5–36.0)
MCV: 98.8 fL (ref 79.5–101.0)
MONO#: 0.4 10*3/uL (ref 0.1–0.9)
MONO%: 16.1 % — ABNORMAL HIGH (ref 0.0–14.0)
NEUT#: 1.2 10*3/uL — ABNORMAL LOW (ref 1.5–6.5)
NEUT%: 49 % (ref 38.4–76.8)
Platelets: 147 10*3/uL (ref 145–400)
RBC: 3.34 10*6/uL — AB (ref 3.70–5.45)
RDW: 18.6 % — ABNORMAL HIGH (ref 11.2–14.5)
WBC: 2.4 10*3/uL — ABNORMAL LOW (ref 3.9–10.3)
lymph#: 0.6 10*3/uL — ABNORMAL LOW (ref 0.9–3.3)

## 2015-06-05 LAB — COMPREHENSIVE METABOLIC PANEL
ALBUMIN: 3.2 g/dL — AB (ref 3.5–5.0)
ALK PHOS: 82 U/L (ref 40–150)
ALT: 10 U/L (ref 0–55)
ANION GAP: 7 meq/L (ref 3–11)
AST: 9 U/L (ref 5–34)
BUN: 7.3 mg/dL (ref 7.0–26.0)
CALCIUM: 8.5 mg/dL (ref 8.4–10.4)
CHLORIDE: 110 meq/L — AB (ref 98–109)
CO2: 24 mEq/L (ref 22–29)
CREATININE: 0.9 mg/dL (ref 0.6–1.1)
EGFR: 77 mL/min/{1.73_m2} — ABNORMAL LOW (ref >90)
Glucose: 105 mg/dl (ref 70–140)
POTASSIUM: 3.3 meq/L — AB (ref 3.5–5.1)
Sodium: 141 mEq/L (ref 136–145)
Total Bilirubin: 0.63 mg/dL (ref 0.20–1.20)
Total Protein: 6.1 g/dL — ABNORMAL LOW (ref 6.4–8.3)

## 2015-06-05 LAB — LACTATE DEHYDROGENASE: LDH: 140 U/L (ref 125–245)

## 2015-06-05 NOTE — Progress Notes (Signed)
Quick Note:  Call patient with the result and advise her to increase k rich diet ______

## 2015-06-07 ENCOUNTER — Telehealth: Payer: Self-pay | Admitting: Medical Oncology

## 2015-06-07 DIAGNOSIS — C9000 Multiple myeloma not having achieved remission: Secondary | ICD-10-CM | POA: Diagnosis not present

## 2015-06-07 DIAGNOSIS — Z79899 Other long term (current) drug therapy: Secondary | ICD-10-CM | POA: Diagnosis not present

## 2015-06-07 DIAGNOSIS — R252 Cramp and spasm: Secondary | ICD-10-CM | POA: Diagnosis not present

## 2015-06-07 DIAGNOSIS — G893 Neoplasm related pain (acute) (chronic): Secondary | ICD-10-CM | POA: Diagnosis not present

## 2015-06-07 DIAGNOSIS — R5383 Other fatigue: Secondary | ICD-10-CM | POA: Diagnosis not present

## 2015-06-07 DIAGNOSIS — B370 Candidal stomatitis: Secondary | ICD-10-CM | POA: Diagnosis not present

## 2015-06-07 LAB — KAPPA/LAMBDA LIGHT CHAINS
KAPPA FREE LGHT CHN: 1.92 mg/dL (ref 0.33–1.94)
KAPPA LAMBDA RATIO: 0.32 (ref 0.26–1.65)
LAMBDA FREE LGHT CHN: 6.03 mg/dL — AB (ref 0.57–2.63)

## 2015-06-07 LAB — IGG, IGA, IGM
IGA: 127 mg/dL (ref 69–380)
IGG (IMMUNOGLOBIN G), SERUM: 979 mg/dL (ref 690–1700)
IgM, Serum: 18 mg/dL — ABNORMAL LOW (ref 52–322)

## 2015-06-07 LAB — BETA 2 MICROGLOBULIN, SERUM: Beta-2 Microglobulin: 2.06 mg/L (ref <=2.51)

## 2015-06-07 NOTE — Telephone Encounter (Signed)
-----   Message from Curt Bears, MD sent at 06/05/2015  4:51 PM EST ----- Call patient with the result and advise her to increase k rich diet

## 2015-06-07 NOTE — Telephone Encounter (Signed)
emailed foods high in potassium

## 2015-06-12 ENCOUNTER — Telehealth: Payer: Self-pay | Admitting: Internal Medicine

## 2015-06-12 ENCOUNTER — Other Ambulatory Visit: Payer: Self-pay

## 2015-06-12 ENCOUNTER — Ambulatory Visit (HOSPITAL_BASED_OUTPATIENT_CLINIC_OR_DEPARTMENT_OTHER): Payer: Medicare Other | Admitting: Internal Medicine

## 2015-06-12 ENCOUNTER — Encounter: Payer: Self-pay | Admitting: Internal Medicine

## 2015-06-12 ENCOUNTER — Other Ambulatory Visit: Payer: Self-pay | Admitting: Internal Medicine

## 2015-06-12 VITALS — BP 116/51 | HR 69 | Temp 98.3°F | Resp 18 | Ht 64.0 in | Wt 202.5 lb

## 2015-06-12 DIAGNOSIS — C9000 Multiple myeloma not having achieved remission: Secondary | ICD-10-CM | POA: Diagnosis not present

## 2015-06-12 DIAGNOSIS — M899 Disorder of bone, unspecified: Secondary | ICD-10-CM

## 2015-06-12 DIAGNOSIS — Z5111 Encounter for antineoplastic chemotherapy: Secondary | ICD-10-CM

## 2015-06-12 NOTE — Telephone Encounter (Signed)
Gave and printed appt sched and avs for pt for Jan and Feb

## 2015-06-12 NOTE — Progress Notes (Signed)
[place] [phone]   Fax:([phone]) [place], [place] [name] [phone]  DIAGNOSIS: Recurrent multiple myeloma initially diagnosed in October 2008.   PRIOR THERAPY:  1. Status post palliative radiotherapy to the lumbar spine between L3 and L5. The patient received a total dose of 3000 cGy in 10 fractions under the care of Dr. Lisbeth Renshaw between May 05, 2007 through May 18, 2007. 2. Status post 5 cycles of systemic chemotherapy with Revlimid and low-dose Decadron with good response to this treatment. 3. Status post autologous peripheral blood stem cell transplant at [facility] on Nov 20, 2007 under the care of Dr. Valarie Merino. 4. Status post treatment for disease recurrence with Velcade, Doxil and Decadron. Last dose given Nov 09, 2009. Discontinued secondary to intolerance but the patient had a good response to treatment at that time. 5. Status post palliative radiotherapy to the T2-T6 thoracic vertebrae completed 03/21/2011 under the care of Dr. Lisbeth Renshaw. 6. Systemic chemotherapy with Velcade 1.3 mg per meter squared given on days 1, 4, 8 and 11, and Doxil at 30 mg per meter squared given on day 4 in addition to Decadron status post 4 cycles, discontinued secondary to intolerance. 7. Systemic therapy with Velcade 1.3 mg/M2 subcutaneously in addition to Decadron 40 mg by mouth on a weekly basis, status post 20 cycles. The patient had good response with this treatment but it is discontinue today secondary to worsening peripheral neuropathy. 8. Palliative radiotherapy to the skull lesion as well as the left hip area under the care of Dr. Lisbeth Renshaw. 9. Systemic chemotherapy with Carfilzomib 20 mg/M2 on days 1, 2,  8, 9, 13 and 16 every 4 weeks in addition to weekly Decadron 40 mg by mouth. First dose on 04/19/2013. Status post 4 cycles, discontinued recently secondary to cardiac  dysfunction.  CURRENT THERAPY:  1. Pomalyst 4 mg by mouth daily for 21 days every 4 weeks in addition to dexamethasone 40 mg on a weekly basis. First dose started 12/02/2013. Status post 17 cycles. She started cycle #19 on 06/13/2015. 2. Zometa 4 mg IV given every 3 months   INTERVAL HISTORY: [name] 64 y.o. female returns to the clinic today for follow up visit accompanied by her daughter. The patient is feeling fine today with no specific complaints except for mild back pain. She is tolerating her current treatment with Pomalyst fairly well with no significant adverse effects. She was seen recently by Dr. Evelene Croon at [facility] and her dose of Decadron was reduced to 20 mg weekly. She was also treated for oral thrush with Diflucan. She denied having any significant fatigue or weakness. The patient denied having any significant chest pain, shortness of breath, or hemoptysis. She continues to have cough and has hard time coughing the sputum. She is currently on Mucinex. She has no nausea or vomiting. She has no weight loss or night sweats. She continues to have mild peripheral neuropathy and currently on gabapentin. The patient had repeat myeloma panel performed recently and she is here for evaluation and discussion of her lab results.  MEDICAL HISTORY: Past Medical History  Diagnosis Date  . Hypercholesterolemia   . Compression fracture 04/08/2007    pathologic compression fracture  . Hypothyroidism   . FHx: chemotherapy     s/p 5 cycle revlimid/low dose decadron,s/p velcade,doxil,decadron,  . Hx of radiation therapy 05/05/07-05/18/07,& 03/05/11-03/21/11-    l3&l5 in 2008,  t2-t6 03/2011  . GERD (gastroesophageal reflux disease)   . Insomnia     associated with steroids  . Constipation     takes oxycontin,vicodin  . Hx of radiation therapy 05/05/2007 to 05/18/2007    palliative, L3-5  . Hx of radiation therapy 03/05/2011 to 03/21/2011    palliative T2-T6, c-spine  . History of  autologous stem cell transplant ([place]) 11/20/2007    [facility], Dr Valarie Merino  . PONV (postoperative nausea and vomiting)   . Multiple myeloma ([place]) dx'd 2009  . Metastasis to bone ([place])   . Family history of anesthesia complication     "daughter gets PONV too"  . Pneumonia     "several times"  . Stroke [facility]) 2014    denies residual on 01/27/2014    ALLERGIES:  has No Known Allergies.  MEDICATIONS:  Current Outpatient Prescriptions  Medication Sig Dispense Refill  . ALPRAZolam (XANAX) 0.5 MG tablet Take 1/2 to 1 tablet 2 to 3 x daily as needed for nerves (Patient taking differently: Take 0.5 mg by mouth 3 (three) times daily as needed. ) 90 tablet 5  . aspirin 81 MG tablet Take 81 mg by mouth daily.    . citalopram (CELEXA) 40 MG tablet Take 1 tablet (40 mg total) by mouth daily. (Patient taking differently: Take 20 mg by mouth daily. ) 90 tablet 2  . cyclobenzaprine (FLEXERIL) 10 MG tablet TAKE 1/2 TO 1 TABLET BY MOUTH THREE TIMES DAILY FOR MUSCLE SPASMS 90 tablet 2  . dexamethasone (DECADRON) 4 MG tablet TAKE (10) TABLETS BY MOUTH EVERY FRIDAY 40 tablet 1  . diazepam (VALIUM) 10 MG tablet Take 1 tablet (10 mg total) by mouth every 6 (six) hours as needed for anxiety. 30 tablet 2  . diphenoxylate-atropine (LOMOTIL) 2.5-0.025 MG per tablet Take 2 tablets by mouth 4 (four) times daily as needed for diarrhea or loose stools. 30 tablet 1  . Eszopiclone 3 MG TABS TAKE 1 TABLET BY MOUTH AT BEDTIME AS NEEDED FOR SLEEP 30 tablet 2  . fexofenadine (ALLEGRA) 60 MG tablet Take 1 tablet daily as needed for allergies (Patient taking differently: Take 60 mg by mouth daily. ) 90 tablet 1  . furosemide (LASIX) 40 MG tablet Take lasix 80 mg daily in the morning and 40 mg in the afternoons.  Do not take after 5 pm. (Patient taking differently: Take 40-80 mg by mouth 2 (two) times daily. Take lasix 80 mg daily in the morning and 40 mg in the afternoons.  Do not take after 5 pm.) 90 tablet 3  . gabapentin (NEURONTIN)  600 MG tablet Take 600 mg by mouth 4 (four) times daily as needed (pain). For pain or cramps    . HYDROcodone-acetaminophen (NORCO) 7.5-325 MG tablet Take 1 tablet by mouth every 6 (six) hours as needed for moderate pain. 30 tablet 0  . hyoscyamine (LEVSIN SL) 0.125 MG SL tablet Place 1 tablet (0.125 mg total) under the tongue every 6 (six) hours as needed. (Patient taking differently: Place 0.125 mg under the tongue every 6 (six) hours as needed for cramping. ) 30 tablet 4  . KLOR-CON 10 10 MEQ tablet Take 10 mEq by mouth 2 (two) times daily.  3  . levothyroxine (SYNTHROID, LEVOTHROID) 100 MCG tablet Take 100 mcg by mouth daily before breakfast.     . Magnesium Hydroxide 400 MG CHEW Chew 2 tablets by mouth daily.     . meloxicam (MOBIC) 7.5 MG tablet Take 1 tablet (7.5 mg total)  by mouth daily. 30 tablet 2  . mometasone (NASONEX) 50 MCG/ACT nasal spray Place 2 sprays into the nose daily. 17 g 2  . montelukast (SINGULAIR) 10 MG tablet Take 1 tablet (10 mg total) by mouth daily. 30 tablet 2  . morphine (MS CONTIN) 15 MG 12 hr tablet Take 1 tablet (15 mg total) by mouth every 12 (twelve) hours. 60 tablet 0  . Multiple Vitamin (MULITIVITAMIN WITH MINERALS) TABS Take 1 tablet by mouth every morning.     . ondansetron (ZOFRAN-ODT) 8 MG disintegrating tablet TAKE 1 TABLET BY MOUTH EVERY 8 HOURS AS NEEDED FOR NAUSEA AND VOMITING 30 tablet 1  . pantoprazole (PROTONIX) 40 MG tablet TAKE 1 TABLET (40 MG TOTAL) BY MOUTH 2 (TWO) TIMES DAILY. 60 tablet 5  . pomalidomide (POMALYST) 4 MG capsule Take 1 capsule (4 mg total) by mouth daily. Take with water on days 1-21. Repeat every 28 days. 21 capsule 0  . promethazine-dextromethorphan (PROMETHAZINE-DM) 6.25-15 MG/5ML syrup TAKE 5MLS BY MOUTH 4 TIMES A DAY AS NEEDED FOR COUGH 180 mL 0   No current facility-administered medications for this visit.    SURGICAL HISTORY:  Past Surgical History  Procedure Laterality Date  . Knee arthroscopy Right     "put pin in"   . Knee arthroscopy Right     "took pin out and corrected what was wrong"  . Video bronchoscopy  07/30/2011    Procedure: VIDEO BRONCHOSCOPY WITHOUT FLUORO;  Surgeon: Kathee Delton, MD;  Location: [facility];  Service: Cardiopulmonary;  Laterality: Bilateral;  . Cholecystectomy  1980's  . Portacath placement Right 2009  . Vaginal hysterectomy  1980's    REVIEW OF SYSTEMS:  Constitutional: positive for fatigue Eyes: negative Ears, nose, mouth, throat, and face: negative Respiratory: positive for cough and sputum Cardiovascular: negative Gastrointestinal: negative Genitourinary:negative Integument/breast: negative Hematologic/lymphatic: negative Musculoskeletal:positive for back pain Neurological: negative Behavioral/Psych: negative   PHYSICAL EXAMINATION: General appearance: alert, cooperative, fatigued and no distress Head: Normocephalic, without obvious abnormality, atraumatic Neck: no adenopathy, no JVD, supple, symmetrical, trachea midline and thyroid not enlarged, symmetric, no tenderness/mass/nodules Lymph nodes: Cervical, supraclavicular, and axillary nodes normal. Resp: clear to auscultation bilaterally Back: symmetric, no curvature. ROM normal. No CVA tenderness. Cardio: regular rate and rhythm, S1, S2 normal, no murmur, click, rub or gallop GI: soft, non-tender; bowel sounds normal; no masses,  no organomegaly Extremities: edema 1+ edema of left lower extremity. Neurologic: Alert and oriented X 3, normal strength and tone. Normal symmetric reflexes. Normal coordination and gait  ECOG PERFORMANCE STATUS: 1 - Symptomatic but completely ambulatory  Blood pressure 116/51, pulse 69, temperature 98.3 F (36.8 C), temperature source Oral, resp. rate 18, height _0  (1.626 m), weight 202 lb 8 oz (91.853 kg), SpO2 100 %.  LABORATORY DATA: Lab Results  Component Value Date   WBC 2.4* 06/05/2015   HGB 10.6* 06/05/2015   HCT 33.0* 06/05/2015   MCV 98.8 06/05/2015   PLT  147 06/05/2015      Chemistry      Component Value Date/Time   NA 141 06/05/2015 1123   NA 137 05/05/2015 1400   K 3.3* 06/05/2015 1123   K 3.6 05/05/2015 1400   CL 104 05/05/2015 1400   CL 105 12/17/2012 1015   CO2 24 06/05/2015 1123   CO2 27 05/05/2015 1400   BUN 7.3 06/05/2015 1123   BUN 11 05/05/2015 1400   CREATININE 0.9 06/05/2015 1123   CREATININE 0.82 05/05/2015 1400   CREATININE 0.68 03/14/2015  1111      Component Value Date/Time   CALCIUM 8.5 06/05/2015 1123   CALCIUM 8.5* 05/05/2015 1400   ALKPHOS 82 06/05/2015 1123   ALKPHOS 74 03/14/2015 1111   AST 9 06/05/2015 1123   AST 10 03/14/2015 1111   ALT 10 06/05/2015 1123   ALT 11 03/14/2015 1111   BILITOT 0.63 06/05/2015 1123   BILITOT 1.0 03/14/2015 1111     Myeloma panel: Beta-2 microglobulin 2.06, free Light chain 1.92, free lambda light chain 6.03,/lambda ratio 0.32. IgG 979, IgG 127 and IgM 18.  RADIOGRAPHIC STUDIES:  ASSESSMENT AND PLAN:  This is a very pleasant 64 years old [place] American female with recurrent multiple myeloma recently completed a course of treatment with Velcade and Decadron with improvement in her disease but this was discontinued secondary to peripheral neuropathy. The patient tolerated her treatment with Carfilzomib and Decadron fairly well except for the recent shortness breath and cough which was felt to be secondary to congestive heart failure from her treatment with Carfilzomib. This treatment was discontinued. She was started on treatment with Pomalyst and Decadron status post 18 cycles. She tolerated the last cycle of her treatment well except for mild fatigue and cough.. Her myeloma panel showed no evidence for disease progression. I discussed the lab result with the patient and her daughter. I recommended for the patient to proceed with cycle #19 tomorrow as a scheduled.  I would see the patient back for follow-up visit in one month's for reevaluation before starting the next cycle  of her treatment. For the myeloma panel lesions, the patient will continue her current treatment with Zometa every 3 months as scheduled.  She was advised to call immediately if she has any concerning symptoms in the interval. The patient voices understanding of current disease status and treatment options and is in agreement with the current care plan.  All questions were answered. The patient knows to call the clinic with any problems, questions or concerns. We can certainly see the patient much sooner if necessary.  Disclaimer: This note was dictated with voice recognition software. Similar sounding words can inadvertently be transcribed and may be missed upon review.

## 2015-06-21 ENCOUNTER — Other Ambulatory Visit: Payer: Self-pay

## 2015-06-21 MED ORDER — HYOSCYAMINE SULFATE 0.125 MG SL SUBL: 0.1250 mg | SUBLINGUAL_TABLET | Freq: Four times a day (QID) | SUBLINGUAL | Status: DC | PRN

## 2015-07-04 ENCOUNTER — Other Ambulatory Visit: Payer: Self-pay | Admitting: Medical Oncology

## 2015-07-04 DIAGNOSIS — C9000 Multiple myeloma not having achieved remission: Secondary | ICD-10-CM

## 2015-07-04 MED ORDER — HYDROCODONE-ACETAMINOPHEN 7.5-325 MG PO TABS: 1.0000 | ORAL_TABLET | Freq: Four times a day (QID) | ORAL | Status: DC | PRN

## 2015-07-04 MED ORDER — MORPHINE SULFATE ER 15 MG PO TBCR: 15.0000 mg | EXTENDED_RELEASE_TABLET | Freq: Two times a day (BID) | ORAL | Status: DC

## 2015-07-04 NOTE — Progress Notes (Signed)
rx locked  Up. Pt notified to pick up rx tomorrow.

## 2015-07-06 ENCOUNTER — Other Ambulatory Visit: Payer: Self-pay | Admitting: Medical Oncology

## 2015-07-06 DIAGNOSIS — C9000 Multiple myeloma not having achieved remission: Secondary | ICD-10-CM

## 2015-07-06 MED ORDER — POMALIDOMIDE 4 MG PO CAPS: 4.0000 mg | ORAL_CAPSULE | Freq: Every day | ORAL | Status: DC

## 2015-07-07 ENCOUNTER — Ambulatory Visit (HOSPITAL_BASED_OUTPATIENT_CLINIC_OR_DEPARTMENT_OTHER): Payer: Medicare Other | Admitting: Nurse Practitioner

## 2015-07-07 ENCOUNTER — Other Ambulatory Visit (HOSPITAL_BASED_OUTPATIENT_CLINIC_OR_DEPARTMENT_OTHER): Payer: Medicare Other

## 2015-07-07 ENCOUNTER — Telehealth: Payer: Self-pay | Admitting: *Deleted

## 2015-07-07 ENCOUNTER — Telehealth: Payer: Self-pay | Admitting: Nurse Practitioner

## 2015-07-07 ENCOUNTER — Other Ambulatory Visit (HOSPITAL_COMMUNITY)
Admission: RE | Admit: 2015-07-07 | Discharge: 2015-07-07 | Disposition: A | Discharge status: Home / Self Care | Payer: Medicare Other | Source: Ambulatory Visit | Attending: Internal Medicine | Admitting: Internal Medicine

## 2015-07-07 ENCOUNTER — Ambulatory Visit (HOSPITAL_BASED_OUTPATIENT_CLINIC_OR_DEPARTMENT_OTHER): Payer: Medicare Other

## 2015-07-07 ENCOUNTER — Other Ambulatory Visit: Payer: Self-pay

## 2015-07-07 VITALS — BP 112/53 | HR 66 | Temp 98.2°F | Resp 18 | Ht 64.0 in | Wt 200.0 lb

## 2015-07-07 DIAGNOSIS — E860 Dehydration: Secondary | ICD-10-CM

## 2015-07-07 DIAGNOSIS — R197 Diarrhea, unspecified: Secondary | ICD-10-CM | POA: Diagnosis not present

## 2015-07-07 DIAGNOSIS — Z95828 Presence of other vascular implants and grafts: Secondary | ICD-10-CM

## 2015-07-07 DIAGNOSIS — C9000 Multiple myeloma not having achieved remission: Secondary | ICD-10-CM | POA: Diagnosis not present

## 2015-07-07 LAB — COMPREHENSIVE METABOLIC PANEL
ALBUMIN: 3.3 g/dL — AB (ref 3.5–5.0)
ALK PHOS: 68 U/L (ref 40–150)
ALT: 10 U/L (ref 0–55)
ANION GAP: 9 meq/L (ref 3–11)
AST: 9 U/L (ref 5–34)
BILIRUBIN TOTAL: 0.84 mg/dL (ref 0.20–1.20)
BUN: 9.2 mg/dL (ref 7.0–26.0)
CALCIUM: 9.1 mg/dL (ref 8.4–10.4)
CO2: 23 mEq/L (ref 22–29)
Chloride: 107 mEq/L (ref 98–109)
Creatinine: 0.9 mg/dL (ref 0.6–1.1)
EGFR: 82 mL/min/{1.73_m2} — AB (ref >90)
GLUCOSE: 100 mg/dL (ref 70–140)
POTASSIUM: 3.8 meq/L (ref 3.5–5.1)
SODIUM: 138 meq/L (ref 136–145)
TOTAL PROTEIN: 6.7 g/dL (ref 6.4–8.3)

## 2015-07-07 LAB — CBC WITH DIFFERENTIAL/PLATELET
BASO%: 0.9 % (ref 0.0–2.0)
Basophils Absolute: 0 10*3/uL (ref 0.0–0.1)
EOS%: 2.3 % (ref 0.0–7.0)
Eosinophils Absolute: 0.1 10*3/uL (ref 0.0–0.5)
HEMATOCRIT: 33.8 % — AB (ref 34.8–46.6)
HEMOGLOBIN: 11 g/dL — AB (ref 11.6–15.9)
LYMPH#: 1.6 10*3/uL (ref 0.9–3.3)
LYMPH%: 47.1 % (ref 14.0–49.7)
MCH: 32.1 pg (ref 25.1–34.0)
MCHC: 32.5 g/dL (ref 31.5–36.0)
MCV: 98.5 fL (ref 79.5–101.0)
MONO#: 0.3 10*3/uL (ref 0.1–0.9)
MONO%: 9.9 % (ref 0.0–14.0)
NEUT#: 1.4 10*3/uL — ABNORMAL LOW (ref 1.5–6.5)
NEUT%: 39.8 % (ref 38.4–76.8)
Platelets: 183 10*3/uL (ref 145–400)
RBC: 3.43 10*6/uL — ABNORMAL LOW (ref 3.70–5.45)
RDW: 16.8 % — AB (ref 11.2–14.5)
WBC: 3.4 10*3/uL — ABNORMAL LOW (ref 3.9–10.3)

## 2015-07-07 MED ORDER — SODIUM CHLORIDE 0.9 % IJ SOLN
10.0000 mL | INTRAMUSCULAR | Status: DC | PRN
  Administered 2015-07-07: 10 mL via INTRAVENOUS
  Filled 2015-07-07: qty 10

## 2015-07-07 MED ORDER — SODIUM CHLORIDE 0.9 % IJ SOLN
10.0000 mL | Freq: Once | INTRAMUSCULAR | Status: AC
  Administered 2015-07-07: 10 mL via INTRAVENOUS
  Filled 2015-07-07: qty 10

## 2015-07-07 MED ORDER — SODIUM CHLORIDE 0.9 % IV SOLN
Freq: Once | INTRAVENOUS | Status: AC
  Administered 2015-07-07: 16:00:00 via INTRAVENOUS

## 2015-07-07 MED ORDER — HEPARIN SOD (PORK) LOCK FLUSH 100 UNIT/ML IV SOLN
500.0000 [IU] | Freq: Once | INTRAVENOUS | Status: AC
  Administered 2015-07-07: 500 [IU] via INTRAVENOUS
  Filled 2015-07-07: qty 5

## 2015-07-07 NOTE — Patient Instructions (Signed)

## 2015-07-07 NOTE — Telephone Encounter (Signed)
Patient called reporting "wearing diapers on Sunday for liquid diarrhea.  Since Monday, four stools during the day getting up at night every two hours.  Taking immodium and lomotil, slows down but returns.  Drinking Gatorade, resource and more but what goes in comes out and looks like what went in.  Slight nausea.  Weak and tired."  Asked if she can come in and she is on her way.

## 2015-07-07 NOTE — Telephone Encounter (Signed)
per pof to sch pt-pt aware

## 2015-07-09 ENCOUNTER — Encounter: Payer: Self-pay | Admitting: Nurse Practitioner

## 2015-07-09 DIAGNOSIS — E860 Dehydration: Secondary | ICD-10-CM | POA: Insufficient documentation

## 2015-07-09 DIAGNOSIS — R197 Diarrhea, unspecified: Secondary | ICD-10-CM | POA: Insufficient documentation

## 2015-07-09 NOTE — Assessment & Plan Note (Signed)
Patient continues to take Pomalyst oral therapy.  3 weeks on and one week off.  She last received Zometa infusion on 05/11/2015.  Labs are within normal limits.  Patient is scheduled to return on 07/10/2015 for labs and a follow-up visit.  She is scheduled for her next Zometa infusion on 08/03/2015.

## 2015-07-09 NOTE — Assessment & Plan Note (Signed)
Patient reports having diarrhea 3-4 times a day for the past week.  She denies any nausea, vomiting, or abdominal discomfort.  She also denies any recent fevers or chills.  Exam today revealed abdomen soft and bowel sounds positive.  There was no abdominal discomfort with palpation.  Patient was unable to give a stool sample while at the [facility] today; and will be sent home with a stool kit to rule out C. difficile.  Patient states that she already has both Imodium and Lomotil at home to alternate as needed.  Patient was given a proximally 1500 ML's normal saline IV fluid rehydration while the cancer [facility] today.  Patient was encouraged to push fluids at home as well.

## 2015-07-09 NOTE — Progress Notes (Signed)
SYMPTOM MANAGEMENT CLINIC   HPI: [name] 65 y.o. female diagnosed with multiple myeloma.  Currently undergoing Pomalyst oral therapy and Zometa infusions.   Patient reports having diarrhea 3-4 times a day for the past week.  She denies any nausea, vomiting, or abdominal discomfort.  She also denies any recent fevers or chills.  Exam today revealed abdomen soft and bowel sounds positive.  There was no abdominal discomfort with palpation.  Patient was unable to give a stool sample while at the [facility] today; and will be sent home with a stool kit to rule out C. difficile.  Patient states that she already has both Imodium and Lomotil at home to alternate as needed.  Patient was given a proximally 1500 ML's normal saline IV fluid rehydration while the cancer Center today.  Patient was encouraged to push fluids at home as well.  HPI  ROS  Past Medical History  Diagnosis Date  . Hypercholesterolemia   . Compression fracture 04/08/2007    pathologic compression fracture  . Hypothyroidism   . FHx: chemotherapy     s/p 5 cycle revlimid/low dose decadron,s/p velcade,doxil,decadron,  . Hx of radiation therapy 05/05/07-05/18/07,& 03/05/11-03/21/11-    l3&l5 in 2008, t2-t6 03/2011  . GERD (gastroesophageal reflux disease)   . Insomnia     associated with steroids  . Constipation     takes oxycontin,vicodin  . Hx of radiation therapy 05/05/2007 to 05/18/2007    palliative, L3-5  . Hx of radiation therapy 03/05/2011 to 03/21/2011    palliative T2-T6, c-spine  . History of autologous stem cell transplant ([place]) 11/20/2007    [facility], Dr Valarie Merino  . PONV (postoperative nausea and vomiting)   . Multiple myeloma ([place]) dx'd 2009  . Metastasis to bone ([place])   . Family history of anesthesia complication     "daughter gets PONV too"  . Pneumonia     "several times"  . Stroke [facility]) 2014    denies residual on 01/27/2014    Past Surgical History  Procedure Laterality Date  . Knee  arthroscopy Right     "put pin in"  . Knee arthroscopy Right     "took pin out and corrected what was wrong"  . Video bronchoscopy  07/30/2011    Procedure: VIDEO BRONCHOSCOPY WITHOUT FLUORO;  Surgeon: Kathee Delton, MD;  Location: [facility];  Service: Cardiopulmonary;  Laterality: Bilateral;  . Cholecystectomy  1980's  . Portacath placement Right 2009  . Vaginal hysterectomy  1980's    has Multiple myeloma ([place]); Hypothyroidism; GERD; Essential hypertension; History of autologous stem cell transplant ([place]); Hyperlipidemia; Medication management; Vitamin D deficiency; CKD stage 3 due to type 2 diabetes mellitus ([place]); Pain and swelling of left lower extremity; Encounter for antineoplastic chemotherapy; Lytic bone lesions on xray; Diarrhea; and Dehydration on her problem list.    has No Known Allergies.    Medication List       This list is accurate as of: 07/07/15 11:59 PM.  Always use your most recent med list.               ALPRAZolam 0.5 MG tablet  Commonly known as:  XANAX  Take 1/2 to 1 tablet 2 to 3 x daily as needed for nerves     aspirin 81 MG tablet  Take 81 mg by mouth daily.     citalopram 40 MG tablet  Commonly known as:  CELEXA  Take 1 tablet (40 mg total) by mouth daily.  cyclobenzaprine 10 MG tablet  Commonly known as:  FLEXERIL  TAKE 1/2 TO 1 TABLET BY MOUTH THREE TIMES DAILY FOR MUSCLE SPASMS     dexamethasone 4 MG tablet  Commonly known as:  DECADRON  TAKE (10) TABLETS BY MOUTH EVERY FRIDAY     diazepam 10 MG tablet  Commonly known as:  VALIUM  Take 1 tablet (10 mg total) by mouth every 6 (six) hours as needed for anxiety.     DIPHENHYD-LIDOCAINE-NYSTATIN MT     diphenoxylate-atropine 2.5-0.025 MG tablet  Commonly known as:  LOMOTIL  Take 2 tablets by mouth 4 (four) times daily as needed for diarrhea or loose stools.     Eszopiclone 3 MG Tabs  TAKE 1 TABLET BY MOUTH AT BEDTIME AS NEEDED FOR SLEEP     fexofenadine 60 MG tablet  Commonly  known as:  ALLEGRA  Take 1 tablet daily as needed for allergies     furosemide 40 MG tablet  Commonly known as:  LASIX  Take lasix 80 mg daily in the morning and 40 mg in the afternoons.  Do not take after 5 pm.     gabapentin 600 MG tablet  Commonly known as:  NEURONTIN  Take 600 mg by mouth 4 (four) times daily as needed (pain). For pain or cramps     HYDROcodone-acetaminophen 7.5-325 MG tablet  Commonly known as:  NORCO  Take 1 tablet by mouth every 6 (six) hours as needed for moderate pain.     hyoscyamine 0.125 MG SL tablet  Commonly known as:  LEVSIN SL  Place 1 tablet (0.125 mg total) under the tongue every 6 (six) hours as needed for cramping. Must have office visit for further refills     KLOR-CON 10 10 MEQ tablet  Generic drug:  potassium chloride  Take 10 mEq by mouth 2 (two) times daily.     levothyroxine 100 MCG tablet  Commonly known as:  SYNTHROID, LEVOTHROID  Take 100 mcg by mouth daily before breakfast.     magic mouthwash Soln  Take by mouth.     Magnesium Hydroxide 400 MG Chew  Chew 2 tablets by mouth daily.     meloxicam 7.5 MG tablet  Commonly known as:  MOBIC  Take 1 tablet (7.5 mg total) by mouth daily.     mometasone 50 MCG/ACT nasal spray  Commonly known as:  NASONEX  Place 2 sprays into the nose daily.     montelukast 10 MG tablet  Commonly known as:  SINGULAIR  Take 1 tablet (10 mg total) by mouth daily.     morphine 15 MG 12 hr tablet  Commonly known as:  MS CONTIN  Take 1 tablet (15 mg total) by mouth every 12 (twelve) hours.     multivitamin with minerals Tabs tablet  Take 1 tablet by mouth every morning.     ondansetron 8 MG disintegrating tablet  Commonly known as:  ZOFRAN-ODT  TAKE 1 TABLET BY MOUTH EVERY 8 HOURS AS NEEDED FOR NAUSEA AND VOMITING     pantoprazole 40 MG tablet  Commonly known as:  PROTONIX  TAKE 1 TABLET (40 MG TOTAL) BY MOUTH 2 (TWO) TIMES DAILY.     pomalidomide 4 MG capsule  Commonly known as:  POMALYST    Take 1 capsule (4 mg total) by mouth daily. Take with water on days 1-21. Repeat every 28 days.Josem Kaufmann [id number] on 07/06/15     promethazine-dextromethorphan 6.25-15 MG/5ML syrup  Commonly known as:  PROMETHAZINE-DM  TAKE 5MLS BY MOUTH 4 TIMES A DAY AS NEEDED FOR COUGH         PHYSICAL EXAMINATION  Oncology Vitals 07/07/2015 06/12/2015  Height 163 cm 163 cm  Weight 90.719 kg 91.853 kg  Weight (lbs) 200 lbs 202 lbs 8 oz  BMI (kg/m2) 34.33 kg/m2 34.76 kg/m2  Temp 98.2 98.3  Pulse 66 69  Resp 18 18  Resp (Historical as of 01/30/12) - -  SpO2 100 100  BSA (m2) 2.02 m2 2.04 m2   BP Readings from Last 2 Encounters:  07/07/15 112/53  06/12/15 116/51    Physical Exam  Constitutional: She is oriented to person, place, and time and well-developed, well-nourished, and in no distress.  HENT:  Head: Normocephalic and atraumatic.  Eyes: Conjunctivae and EOM are normal. Pupils are equal, round, and reactive to light. Right eye exhibits no discharge. Left eye exhibits no discharge. No scleral icterus.  Neck: Normal range of motion. No JVD present. No tracheal deviation present. No thyromegaly present.  Cardiovascular: Normal rate, regular rhythm, normal heart sounds and intact distal pulses.   Pulmonary/Chest: Effort normal and breath sounds normal. No respiratory distress. She has no wheezes. She has no rales. She exhibits no tenderness.  Abdominal: Soft. Bowel sounds are normal. She exhibits no distension and no mass. There is no tenderness. There is no rebound and no guarding.  Musculoskeletal: Normal range of motion. She exhibits no edema or tenderness.  Lymphadenopathy:    She has no cervical adenopathy.  Neurological: She is alert and oriented to person, place, and time. Gait normal.  Skin: Skin is warm and dry. No rash noted. No erythema. No pallor.  Psychiatric: Affect normal.  Nursing note and vitals reviewed.   LABORATORY DATA:. Appointment on 07/07/2015  Component Date Value Ref  Range Status  . WBC 07/07/2015 3.4* 3.9 - 10.3 10e3/uL Final  . NEUT# 07/07/2015 1.4* 1.5 - 6.5 10e3/uL Final  . HGB 07/07/2015 11.0* 11.6 - 15.9 g/dL Final  . HCT 07/07/2015 33.8* 34.8 - 46.6 % Final  . Platelets 07/07/2015 183  145 - 400 10e3/uL Final  . MCV 07/07/2015 98.5  79.5 - 101.0 fL Final  . MCH 07/07/2015 32.1  25.1 - 34.0 pg Final  . MCHC 07/07/2015 32.5  31.5 - 36.0 g/dL Final  . RBC 07/07/2015 3.43* 3.70 - 5.45 10e6/uL Final  . RDW 07/07/2015 16.8* 11.2 - 14.5 % Final  . lymph# 07/07/2015 1.6  0.9 - 3.3 10e3/uL Final  . MONO# 07/07/2015 0.3  0.1 - 0.9 10e3/uL Final  . Eosinophils Absolute 07/07/2015 0.1  0.0 - 0.5 10e3/uL Final  . Basophils Absolute 07/07/2015 0.0  0.0 - 0.1 10e3/uL Final  . NEUT% 07/07/2015 39.8  38.4 - 76.8 % Final  . LYMPH% 07/07/2015 47.1  14.0 - 49.7 % Final  . MONO% 07/07/2015 9.9  0.0 - 14.0 % Final  . EOS% 07/07/2015 2.3  0.0 - 7.0 % Final  . BASO% 07/07/2015 0.9  0.0 - 2.0 % Final  . Sodium 07/07/2015 138  136 - 145 mEq/L Final  . Potassium 07/07/2015 3.8  3.5 - 5.1 mEq/L Final  . Chloride 07/07/2015 107  98 - 109 mEq/L Final  . CO2 07/07/2015 23  22 - 29 mEq/L Final  . Glucose 07/07/2015 100  70 - 140 mg/dl Final   Glucose reference range is for nonfasting patients. Fasting glucose reference range is 70- 100.  Marland Kitchen BUN 07/07/2015 9.2  7.0 - 26.0 mg/dL Final  . Creatinine  07/07/2015 0.9  0.6 - 1.1 mg/dL Final  . Total Bilirubin 07/07/2015 0.84  0.20 - 1.20 mg/dL Final  . Alkaline Phosphatase 07/07/2015 68  40 - 150 U/L Final  . AST 07/07/2015 9  5 - 34 U/L Final  . ALT 07/07/2015 10  0 - 55 U/L Final  . Total Protein 07/07/2015 6.7  6.4 - 8.3 g/dL Final  . Albumin 07/07/2015 3.3* 3.5 - 5.0 g/dL Final  . Calcium 07/07/2015 9.1  8.4 - 10.4 mg/dL Final  . Anion Gap 07/07/2015 9  3 - 11 mEq/L Final  . EGFR 07/07/2015 82* >90 ml/min/1.73 m2 Final   eGFR is calculated using the CKD-EPI Creatinine Equation (2009)     RADIOGRAPHIC STUDIES: No  results found.  ASSESSMENT/PLAN:    Multiple myeloma ([place]) Patient continues to take Pomalyst oral therapy.  3 weeks on and one week off.  She last received Zometa infusion on 05/11/2015.  Labs are within normal limits.  Patient is scheduled to return on 07/10/2015 for labs and a follow-up visit.  She is scheduled for her next Zometa infusion on 08/03/2015.  Diarrhea Patient reports having diarrhea 3-4 times a day for the past week.  She denies any nausea, vomiting, or abdominal discomfort.  She also denies any recent fevers or chills.  Exam today revealed abdomen soft and bowel sounds positive.  There was no abdominal discomfort with palpation.  Patient was unable to give a stool sample while at the [facility] today; and will be sent home with a stool kit to rule out C. difficile.  Patient states that she already has both Imodium and Lomotil at home to alternate as needed.  Dehydration Patient reports having diarrhea 3-4 times a day for the past week.  She denies any nausea, vomiting, or abdominal discomfort.  She also denies any recent fevers or chills.  Exam today revealed abdomen soft and bowel sounds positive.  There was no abdominal discomfort with palpation.  Patient was unable to give a stool sample while at the [facility] today; and will be sent home with a stool kit to rule out C. difficile.  Patient states that she already has both Imodium and Lomotil at home to alternate as needed.  Patient was given a proximally 1500 ML's normal saline IV fluid rehydration while the cancer Center today.  Patient was encouraged to push fluids at home as well.  Patient stated understanding of all instructions; and was in agreement with this plan of care. The patient knows to call the clinic with any problems, questions or concerns.   Review/collaboration with Dr. Julien Nordmann regarding all aspects of patient's visit today.   Total time spent with patient was 25 minutes;  with greater  than 75 percent of that time spent in face to face counseling regarding patient's symptoms,  and coordination of care and follow up.  Disclaimer:This dictation was prepared with Dragon/digital dictation along with [employer]. Any transcriptional errors that result from this process are unintentional.  Drue Second, NP 07/09/2015

## 2015-07-09 NOTE — Assessment & Plan Note (Signed)
Patient reports having diarrhea 3-4 times a day for the past week.  She denies any nausea, vomiting, or abdominal discomfort.  She also denies any recent fevers or chills.  Exam today revealed abdomen soft and bowel sounds positive.  There was no abdominal discomfort with palpation.  Patient was unable to give a stool sample while at the [place] today; and will be sent home with a stool kit to rule out C. difficile.  Patient states that she already has both Imodium and Lomotil at home to alternate as needed.

## 2015-07-10 ENCOUNTER — Telehealth: Payer: Self-pay | Admitting: *Deleted

## 2015-07-10 ENCOUNTER — Telehealth: Payer: Self-pay | Admitting: Internal Medicine

## 2015-07-10 ENCOUNTER — Encounter: Payer: Self-pay | Admitting: Internal Medicine

## 2015-07-10 ENCOUNTER — Ambulatory Visit (HOSPITAL_BASED_OUTPATIENT_CLINIC_OR_DEPARTMENT_OTHER): Payer: Medicare Other | Admitting: Internal Medicine

## 2015-07-10 ENCOUNTER — Other Ambulatory Visit (HOSPITAL_BASED_OUTPATIENT_CLINIC_OR_DEPARTMENT_OTHER): Payer: Medicare Other

## 2015-07-10 VITALS — BP 112/88 | HR 72 | Temp 98.5°F | Resp 18 | Ht 64.0 in | Wt 204.2 lb

## 2015-07-10 DIAGNOSIS — C9000 Multiple myeloma not having achieved remission: Secondary | ICD-10-CM | POA: Diagnosis not present

## 2015-07-10 DIAGNOSIS — Z5111 Encounter for antineoplastic chemotherapy: Secondary | ICD-10-CM

## 2015-07-10 DIAGNOSIS — D709 Neutropenia, unspecified: Secondary | ICD-10-CM

## 2015-07-10 LAB — CBC WITH DIFFERENTIAL/PLATELET
BASO%: 1.7 % (ref 0.0–2.0)
Basophils Absolute: 0.1 10*3/uL (ref 0.0–0.1)
EOS%: 2.7 % (ref 0.0–7.0)
Eosinophils Absolute: 0.1 10*3/uL (ref 0.0–0.5)
HEMATOCRIT: 34.3 % — AB (ref 34.8–46.6)
HEMOGLOBIN: 10.9 g/dL — AB (ref 11.6–15.9)
LYMPH#: 1.2 10*3/uL (ref 0.9–3.3)
LYMPH%: 40.1 % (ref 14.0–49.7)
MCH: 31.9 pg (ref 25.1–34.0)
MCHC: 31.8 g/dL (ref 31.5–36.0)
MCV: 100.3 fL (ref 79.5–101.0)
MONO#: 0.7 10*3/uL (ref 0.1–0.9)
MONO%: 22.9 % — ABNORMAL HIGH (ref 0.0–14.0)
NEUT%: 32.6 % — ABNORMAL LOW (ref 38.4–76.8)
NEUTROS ABS: 1 10*3/uL — AB (ref 1.5–6.5)
PLATELETS: 216 10*3/uL (ref 145–400)
RBC: 3.42 10*6/uL — ABNORMAL LOW (ref 3.70–5.45)
RDW: 17 % — AB (ref 11.2–14.5)
WBC: 2.9 10*3/uL — AB (ref 3.9–10.3)

## 2015-07-10 LAB — COMPREHENSIVE METABOLIC PANEL
ALT: 11 U/L (ref 0–55)
ANION GAP: 7 meq/L (ref 3–11)
AST: 11 U/L (ref 5–34)
Albumin: 3.3 g/dL — ABNORMAL LOW (ref 3.5–5.0)
Alkaline Phosphatase: 74 U/L (ref 40–150)
BILIRUBIN TOTAL: 0.75 mg/dL (ref 0.20–1.20)
BUN: 11.1 mg/dL (ref 7.0–26.0)
CALCIUM: 8.7 mg/dL (ref 8.4–10.4)
CO2: 25 mEq/L (ref 22–29)
CREATININE: 1.1 mg/dL (ref 0.6–1.1)
Chloride: 109 mEq/L (ref 98–109)
EGFR: 63 mL/min/{1.73_m2} — ABNORMAL LOW (ref >90)
Glucose: 100 mg/dl (ref 70–140)
Potassium: 3.4 mEq/L — ABNORMAL LOW (ref 3.5–5.1)
Sodium: 141 mEq/L (ref 136–145)
TOTAL PROTEIN: 6.5 g/dL (ref 6.4–8.3)

## 2015-07-10 LAB — LACTATE DEHYDROGENASE: LDH: 146 U/L (ref 125–245)

## 2015-07-10 NOTE — Progress Notes (Signed)
[place] [phone]   Fax:([phone]) [place], [place] [address]  DIAGNOSIS: Recurrent multiple myeloma initially diagnosed in October 2008.   PRIOR THERAPY:  1. Status post palliative radiotherapy to the lumbar spine between L3 and L5. The patient received a total dose of 3000 cGy in 10 fractions under the care of Dr. Lisbeth Renshaw between May 05, 2007 through May 18, 2007. 2. Status post 5 cycles of systemic chemotherapy with Revlimid and low-dose Decadron with good response to this treatment. 3. Status post autologous peripheral blood stem cell transplant at [facility] on Nov 20, 2007 under the care of Dr. Valarie Merino. 4. Status post treatment for disease recurrence with Velcade, Doxil and Decadron. Last dose given Nov 09, 2009. Discontinued secondary to intolerance but the patient had a good response to treatment at that time. 5. Status post palliative radiotherapy to the T2-T6 thoracic vertebrae completed 03/21/2011 under the care of Dr. Lisbeth Renshaw. 6. Systemic chemotherapy with Velcade 1.3 mg per meter squared given on days 1, 4, 8 and 11, and Doxil at 30 mg per meter squared given on day 4 in addition to Decadron status post 4 cycles, discontinued secondary to intolerance. 7. Systemic therapy with Velcade 1.3 mg/M2 subcutaneously in addition to Decadron 40 mg by mouth on a weekly basis, status post 20 cycles. The patient had good response with this treatment but it is discontinue today secondary to worsening peripheral neuropathy. 8. Palliative radiotherapy to the skull lesion as well as the left hip area under the care of Dr. Lisbeth Renshaw. 9. Systemic chemotherapy with Carfilzomib 20 mg/M2 on days 1, 2,  8, 9, 13 and 16 every 4 weeks in addition to weekly Decadron 40 mg by mouth. First dose on 04/19/2013. Status post 4 cycles, discontinued recently secondary to cardiac  dysfunction.  CURRENT THERAPY:  1. Pomalyst 4 mg by mouth daily for 21 days every 4 weeks in addition to dexamethasone 40 mg on a weekly basis. First dose started 12/02/2013. Status post 17 cycles. She started cycle #20 on 07/17/2015. 2. Zometa 4 mg IV given every 3 months   INTERVAL HISTORY: [name] 65 y.o. female returns to the clinic today for follow up visit accompanied by her daughter. The patient is feeling fine today with no specific complaints. She is tolerating her current treatment with Pomalyst fairly well with no significant adverse effects except for 2-3 episodes of diarrhea on daily basis for the last week. She took Imodium with mild improvement. The patient was seen at the symptom management clinic a few days ago and was started on Lomotil with improvement of her diarrhea. She also complains of generalized weakness. The patient denied having any significant chest pain, shortness of breath, or hemoptysis. She has no nausea or vomiting. She has no weight loss or night sweats. She continues to have mild peripheral neuropathy and currently on gabapentin. The patient had repeat CBC and comprehensive metabolic panel performed earlier today and she is here for evaluation and discussion of her lab results.  MEDICAL HISTORY: Past Medical History  Diagnosis Date  . Hypercholesterolemia   . Compression fracture 04/08/2007    pathologic compression fracture  . Hypothyroidism   . FHx: chemotherapy     s/p 5 cycle revlimid/low dose decadron,s/p velcade,doxil,decadron,  . Hx of radiation therapy 05/05/07-05/18/07,& 03/05/11-03/21/11-    l3&l5 in 2008, t2-t6 03/2011  . GERD (gastroesophageal reflux disease)   .  Insomnia     associated with steroids  . Constipation     takes oxycontin,vicodin  . Hx of radiation therapy 05/05/2007 to 05/18/2007    palliative, L3-5  . Hx of radiation therapy 03/05/2011 to 03/21/2011    palliative T2-T6, c-spine  . History of autologous stem cell  transplant ([place]) 11/20/2007    [facility], Dr Valarie Merino  . PONV (postoperative nausea and vomiting)   . Multiple myeloma ([place]) dx'd 2009  . Metastasis to bone ([place])   . Family history of anesthesia complication     "daughter gets PONV too"  . Pneumonia     "several times"  . Stroke [facility]) 2014    denies residual on 01/27/2014    ALLERGIES:  has No Known Allergies.  MEDICATIONS:  Current Outpatient Prescriptions  Medication Sig Dispense Refill  . ALPRAZolam (XANAX) 0.5 MG tablet Take 1/2 to 1 tablet 2 to 3 x daily as needed for nerves (Patient taking differently: Take 0.5 mg by mouth 3 (three) times daily as needed. ) 90 tablet 5  . aspirin 81 MG tablet Take 81 mg by mouth daily.    . citalopram (CELEXA) 40 MG tablet Take 1 tablet (40 mg total) by mouth daily. (Patient taking differently: Take 20 mg by mouth daily. ) 90 tablet 2  . cyclobenzaprine (FLEXERIL) 10 MG tablet TAKE 1/2 TO 1 TABLET BY MOUTH THREE TIMES DAILY FOR MUSCLE SPASMS 90 tablet 2  . dexamethasone (DECADRON) 4 MG tablet TAKE (10) TABLETS BY MOUTH EVERY FRIDAY 40 tablet 1  . diazepam (VALIUM) 10 MG tablet Take 1 tablet (10 mg total) by mouth every 6 (six) hours as needed for anxiety. 30 tablet 2  . DIPHENHYD-LIDOCAINE-NYSTATIN MT   0  . diphenoxylate-atropine (LOMOTIL) 2.5-0.025 MG per tablet Take 2 tablets by mouth 4 (four) times daily as needed for diarrhea or loose stools. 30 tablet 1  . Eszopiclone 3 MG TABS TAKE 1 TABLET BY MOUTH AT BEDTIME AS NEEDED FOR SLEEP 30 tablet 2  . fexofenadine (ALLEGRA) 60 MG tablet Take 1 tablet daily as needed for allergies (Patient taking differently: Take 60 mg by mouth daily. ) 90 tablet 1  . furosemide (LASIX) 40 MG tablet Take lasix 80 mg daily in the morning and 40 mg in the afternoons.  Do not take after 5 pm. (Patient taking differently: Take 40-80 mg by mouth 2 (two) times daily. Take lasix 80 mg daily in the morning and 40 mg in the afternoons.  Do not take after 5 pm.) 90 tablet 3  .  gabapentin (NEURONTIN) 600 MG tablet Take 600 mg by mouth 4 (four) times daily as needed (pain). For pain or cramps    . HYDROcodone-acetaminophen (NORCO) 7.5-325 MG tablet Take 1 tablet by mouth every 6 (six) hours as needed for moderate pain. 30 tablet 0  . hyoscyamine (LEVSIN SL) 0.125 MG SL tablet Place 1 tablet (0.125 mg total) under the tongue every 6 (six) hours as needed for cramping. Must have office visit for further refills 30 tablet 0  . KLOR-CON 10 10 MEQ tablet Take 10 mEq by mouth 2 (two) times daily.  3  . levothyroxine (SYNTHROID, LEVOTHROID) 100 MCG tablet Take 100 mcg by mouth daily before breakfast.     . magic mouthwash SOLN Take by mouth.    . Magnesium Hydroxide 400 MG CHEW Chew 2 tablets by mouth daily.     . meloxicam (MOBIC) 7.5 MG tablet Take 1 tablet (7.5 mg total) by mouth daily.  30 tablet 2  . mometasone (NASONEX) 50 MCG/ACT nasal spray Place 2 sprays into the nose daily. 17 g 2  . montelukast (SINGULAIR) 10 MG tablet Take 1 tablet (10 mg total) by mouth daily. 30 tablet 2  . morphine (MS CONTIN) 15 MG 12 hr tablet Take 1 tablet (15 mg total) by mouth every 12 (twelve) hours. 60 tablet 0  . Multiple Vitamin (MULITIVITAMIN WITH MINERALS) TABS Take 1 tablet by mouth every morning.     . ondansetron (ZOFRAN-ODT) 8 MG disintegrating tablet TAKE 1 TABLET BY MOUTH EVERY 8 HOURS AS NEEDED FOR NAUSEA AND VOMITING 30 tablet 1  . pantoprazole (PROTONIX) 40 MG tablet TAKE 1 TABLET (40 MG TOTAL) BY MOUTH 2 (TWO) TIMES DAILY. 60 tablet 5  . pomalidomide (POMALYST) 4 MG capsule Take 1 capsule (4 mg total) by mouth daily. Take with water on days 1-21. Repeat every 28 days.Josem Kaufmann [id number] on 07/06/15 21 capsule 0  . promethazine-dextromethorphan (PROMETHAZINE-DM) 6.25-15 MG/5ML syrup TAKE 5MLS BY MOUTH 4 TIMES A DAY AS NEEDED FOR COUGH 180 mL 0  . DIPHENHIST 12.5 MG/5ML liquid      No current facility-administered medications for this visit.    SURGICAL HISTORY:  Past Surgical History   Procedure Laterality Date  . Knee arthroscopy Right     "put pin in"  . Knee arthroscopy Right     "took pin out and corrected what was wrong"  . Video bronchoscopy  07/30/2011    Procedure: VIDEO BRONCHOSCOPY WITHOUT FLUORO;  Surgeon: Kathee Delton, MD;  Location: [facility];  Service: Cardiopulmonary;  Laterality: Bilateral;  . Cholecystectomy  1980's  . Portacath placement Right 2009  . Vaginal hysterectomy  1980's    REVIEW OF SYSTEMS:  A comprehensive review of systems was negative except for: Constitutional: positive for fatigue Gastrointestinal: positive for diarrhea   PHYSICAL EXAMINATION: General appearance: alert, cooperative, fatigued and no distress Head: Normocephalic, without obvious abnormality, atraumatic Neck: no adenopathy, no JVD, supple, symmetrical, trachea midline and thyroid not enlarged, symmetric, no tenderness/mass/nodules Lymph nodes: Cervical, supraclavicular, and axillary nodes normal. Resp: clear to auscultation bilaterally Back: symmetric, no curvature. ROM normal. No CVA tenderness. Cardio: regular rate and rhythm, S1, S2 normal, no murmur, click, rub or gallop GI: soft, non-tender; bowel sounds normal; no masses,  no organomegaly Extremities: edema 1+ edema of left lower extremity. Neurologic: Alert and oriented X 3, normal strength and tone. Normal symmetric reflexes. Normal coordination and gait  ECOG PERFORMANCE STATUS: 1 - Symptomatic but completely ambulatory  Blood pressure 112/88, pulse 72, temperature 98.5 F (36.9 C), temperature source Oral, resp. rate 18, height '5\' 4"'$  (1.626 m), weight 204 lb 3.2 oz (92.625 kg), SpO2 100 %.  LABORATORY DATA: Lab Results  Component Value Date   WBC 2.9* 07/10/2015   HGB 10.9* 07/10/2015   HCT 34.3* 07/10/2015   MCV 100.3 07/10/2015   PLT 216 07/10/2015      Chemistry      Component Value Date/Time   NA 141 07/10/2015 1125   NA 137 05/05/2015 1400   K 3.4* 07/10/2015 1125   K 3.6 05/05/2015  1400   CL 104 05/05/2015 1400   CL 105 12/17/2012 1015   CO2 25 07/10/2015 1125   CO2 27 05/05/2015 1400   BUN 11.1 07/10/2015 1125   BUN 11 05/05/2015 1400   CREATININE 1.1 07/10/2015 1125   CREATININE 0.82 05/05/2015 1400   CREATININE 0.68 03/14/2015 1111      Component Value Date/Time  CALCIUM 8.7 07/10/2015 1125   CALCIUM 8.5* 05/05/2015 1400   ALKPHOS 74 07/10/2015 1125   ALKPHOS 74 03/14/2015 1111   AST 11 07/10/2015 1125   AST 10 03/14/2015 1111   ALT 11 07/10/2015 1125   ALT 11 03/14/2015 1111   BILITOT 0.75 07/10/2015 1125   BILITOT 1.0 03/14/2015 1111     Myeloma panel: Beta-2 microglobulin 2.06, free Light chain 1.92, free lambda light chain 6.03,/lambda ratio 0.32. IgG 979, IgG 127 and IgM 18.  RADIOGRAPHIC STUDIES:  ASSESSMENT AND PLAN:  This is a very pleasant 65 years old [place] American female with recurrent multiple myeloma recently completed a course of treatment with Velcade and Decadron with improvement in her disease but this was discontinued secondary to peripheral neuropathy. The patient tolerated her treatment with Carfilzomib and Decadron fairly well except for the recent shortness breath and cough which was felt to be secondary to congestive heart failure from her treatment with Carfilzomib. This treatment was discontinued. She was started on treatment with Pomalyst and Decadron status post 19 cycles. She tolerated the last cycle of her treatment well except for episodes of diarrhea which completely resolved at this point. Her CBC today showed mild neutropenia. I recommended for the patient to delay the start of cycle #20 by 1 week until improvement of her absolute neutrophil count.  I would see the patient back for follow-up visit in one month for reevaluation before starting the next cycle of her treatment. I will also order MRI of the brain to evaluate the right parietal lesion seen on previous MRI of July 2016. For the myeloma panel lesions, the  patient will continue her current treatment with Zometa every 3 months as scheduled.  She was advised to call immediately if she has any concerning symptoms in the interval. The patient voices understanding of current disease status and treatment options and is in agreement with the current care plan.  All questions were answered. The patient knows to call the clinic with any problems, questions or concerns. We can certainly see the patient much sooner if necessary.  Disclaimer: This note was dictated with voice recognition software. Similar sounding words can inadvertently be transcribed and may be missed upon review.

## 2015-07-10 NOTE — Telephone Encounter (Signed)
Gave patient avs report and appointments for February. Moved 2/2 zometa to 2/6 to coord w/lab/MM visit.

## 2015-07-10 NOTE — Telephone Encounter (Signed)
TC to pt- checking status pt reports feeling much better. She states diarrhea has resolved and she has not been able to get a stool sample. Pt has regular formed stool as of today. Pt confirms appt for today.

## 2015-07-23 ENCOUNTER — Other Ambulatory Visit: Payer: Self-pay | Admitting: Gastroenterology

## 2015-07-28 ENCOUNTER — Other Ambulatory Visit: Payer: Self-pay | Admitting: Internal Medicine

## 2015-07-28 ENCOUNTER — Other Ambulatory Visit: Payer: Self-pay | Admitting: *Deleted

## 2015-07-28 ENCOUNTER — Telehealth: Payer: Self-pay | Admitting: *Deleted

## 2015-07-28 MED ORDER — HYOSCYAMINE SULFATE 0.125 MG SL SUBL: 0.1250 mg | SUBLINGUAL_TABLET | Freq: Four times a day (QID) | SUBLINGUAL | Status: DC | PRN

## 2015-07-28 NOTE — Telephone Encounter (Signed)
LM for the patient on her cell #, [phone]. Advised we sent # 30 tablets with no refills to [facility] [place] under Dr. Owens Loffler. Advised the patient that she will need to call our office for an appointment if she needs further refills for the Hyoscyamine 0.125 mg.

## 2015-08-03 ENCOUNTER — Ambulatory Visit: Payer: Self-pay

## 2015-08-04 ENCOUNTER — Ambulatory Visit (HOSPITAL_COMMUNITY)
Admission: RE | Admit: 2015-08-04 | Discharge: 2015-08-04 | Disposition: A | Discharge status: Home / Self Care | Payer: Medicare Other | Source: Ambulatory Visit | Attending: Internal Medicine | Admitting: Internal Medicine

## 2015-08-04 DIAGNOSIS — R531 Weakness: Secondary | ICD-10-CM | POA: Diagnosis not present

## 2015-08-04 DIAGNOSIS — R2689 Other abnormalities of gait and mobility: Secondary | ICD-10-CM | POA: Diagnosis not present

## 2015-08-04 DIAGNOSIS — Z5111 Encounter for antineoplastic chemotherapy: Secondary | ICD-10-CM | POA: Diagnosis not present

## 2015-08-04 DIAGNOSIS — R410 Disorientation, unspecified: Secondary | ICD-10-CM | POA: Diagnosis not present

## 2015-08-04 DIAGNOSIS — C9000 Multiple myeloma not having achieved remission: Secondary | ICD-10-CM | POA: Diagnosis not present

## 2015-08-04 MED ORDER — GADOBENATE DIMEGLUMINE 529 MG/ML IV SOLN
20.0000 mL | Freq: Once | INTRAVENOUS | Status: AC | PRN
  Administered 2015-08-04: 19 mL via INTRAVENOUS

## 2015-08-07 ENCOUNTER — Ambulatory Visit (HOSPITAL_BASED_OUTPATIENT_CLINIC_OR_DEPARTMENT_OTHER): Payer: Medicare Other

## 2015-08-07 ENCOUNTER — Telehealth: Payer: Self-pay | Admitting: Internal Medicine

## 2015-08-07 ENCOUNTER — Other Ambulatory Visit (HOSPITAL_BASED_OUTPATIENT_CLINIC_OR_DEPARTMENT_OTHER): Payer: Medicare Other

## 2015-08-07 ENCOUNTER — Ambulatory Visit (HOSPITAL_BASED_OUTPATIENT_CLINIC_OR_DEPARTMENT_OTHER): Payer: Medicare Other | Admitting: Internal Medicine

## 2015-08-07 ENCOUNTER — Other Ambulatory Visit: Payer: Self-pay | Admitting: Internal Medicine

## 2015-08-07 ENCOUNTER — Encounter: Payer: Self-pay | Admitting: Internal Medicine

## 2015-08-07 ENCOUNTER — Other Ambulatory Visit: Payer: Self-pay | Admitting: Medical Oncology

## 2015-08-07 VITALS — BP 128/57 | HR 64 | Temp 98.5°F | Resp 18 | Ht 64.0 in | Wt 207.5 lb

## 2015-08-07 DIAGNOSIS — C9000 Multiple myeloma not having achieved remission: Secondary | ICD-10-CM

## 2015-08-07 DIAGNOSIS — D709 Neutropenia, unspecified: Secondary | ICD-10-CM

## 2015-08-07 DIAGNOSIS — Z5111 Encounter for antineoplastic chemotherapy: Secondary | ICD-10-CM

## 2015-08-07 LAB — COMPREHENSIVE METABOLIC PANEL
ALT: 12 U/L (ref 0–55)
ANION GAP: 8 meq/L (ref 3–11)
AST: 10 U/L (ref 5–34)
Albumin: 3.2 g/dL — ABNORMAL LOW (ref 3.5–5.0)
Alkaline Phosphatase: 71 U/L (ref 40–150)
BUN: 13.3 mg/dL (ref 7.0–26.0)
CO2: 29 meq/L (ref 22–29)
Calcium: 9.3 mg/dL (ref 8.4–10.4)
Chloride: 105 mEq/L (ref 98–109)
Creatinine: 0.9 mg/dL (ref 0.6–1.1)
EGFR: 79 mL/min/{1.73_m2} — AB (ref >90)
GLUCOSE: 84 mg/dL (ref 70–140)
POTASSIUM: 3.6 meq/L (ref 3.5–5.1)
Sodium: 143 mEq/L (ref 136–145)
Total Bilirubin: 0.95 mg/dL (ref 0.20–1.20)
Total Protein: 6.4 g/dL (ref 6.4–8.3)

## 2015-08-07 LAB — CBC WITH DIFFERENTIAL/PLATELET
BASO%: 1.1 % (ref 0.0–2.0)
Basophils Absolute: 0 10*3/uL (ref 0.0–0.1)
EOS%: 8.7 % — AB (ref 0.0–7.0)
Eosinophils Absolute: 0.2 10*3/uL (ref 0.0–0.5)
HEMATOCRIT: 35 % (ref 34.8–46.6)
HEMOGLOBIN: 11.2 g/dL — AB (ref 11.6–15.9)
LYMPH#: 1.1 10*3/uL (ref 0.9–3.3)
LYMPH%: 43.9 % (ref 14.0–49.7)
MCH: 32 pg (ref 25.1–34.0)
MCHC: 31.9 g/dL (ref 31.5–36.0)
MCV: 100.4 fL (ref 79.5–101.0)
MONO#: 0.3 10*3/uL (ref 0.1–0.9)
MONO%: 13.3 % (ref 0.0–14.0)
NEUT#: 0.8 10*3/uL — ABNORMAL LOW (ref 1.5–6.5)
NEUT%: 33 % — ABNORMAL LOW (ref 38.4–76.8)
PLATELETS: 153 10*3/uL (ref 145–400)
RBC: 3.49 10*6/uL — ABNORMAL LOW (ref 3.70–5.45)
RDW: 16.9 % — AB (ref 11.2–14.5)
WBC: 2.5 10*3/uL — ABNORMAL LOW (ref 3.9–10.3)

## 2015-08-07 MED ORDER — MORPHINE SULFATE ER 15 MG PO TBCR: 15.0000 mg | EXTENDED_RELEASE_TABLET | Freq: Two times a day (BID) | ORAL | Status: DC

## 2015-08-07 MED ORDER — HEPARIN SOD (PORK) LOCK FLUSH 100 UNIT/ML IV SOLN
250.0000 [IU] | Freq: Once | INTRAVENOUS | Status: AC | PRN
  Administered 2015-08-07: 250 [IU]
  Filled 2015-08-07: qty 5

## 2015-08-07 MED ORDER — HYDROCODONE-ACETAMINOPHEN 7.5-325 MG PO TABS: 1.0000 | ORAL_TABLET | Freq: Four times a day (QID) | ORAL | Status: DC | PRN

## 2015-08-07 MED ORDER — ZOLEDRONIC ACID 4 MG/100ML IV SOLN
4.0000 mg | Freq: Once | INTRAVENOUS | Status: AC
  Administered 2015-08-07: 4 mg via INTRAVENOUS
  Filled 2015-08-07: qty 100

## 2015-08-07 MED ORDER — DIPHENOXYLATE-ATROPINE 2.5-0.025 MG PO TABS: ORAL_TABLET | ORAL | Status: DC

## 2015-08-07 MED ORDER — SODIUM CHLORIDE 0.9 % IJ SOLN
3.0000 mL | Freq: Once | INTRAMUSCULAR | Status: AC | PRN
  Administered 2015-08-07: 3 mL via INTRAVENOUS
  Filled 2015-08-07: qty 10

## 2015-08-07 NOTE — Patient Instructions (Signed)

## 2015-08-07 NOTE — Telephone Encounter (Signed)
per pof to sch pt appt-gave pt copy of avs-sent MW email to sch trmt-pt to get updated sch on MY CHART

## 2015-08-07 NOTE — Progress Notes (Signed)
[facility] [phone]   Fax:([phone]) [place], [place] [address]  DIAGNOSIS: Recurrent multiple myeloma initially diagnosed in October 2008.   PRIOR THERAPY:  1. Status post palliative radiotherapy to the lumbar spine between L3 and L5. The patient received a total dose of 3000 cGy in 10 fractions under the care of Dr. Lisbeth Renshaw between May 05, 2007 through May 18, 2007. 2. Status post 5 cycles of systemic chemotherapy with Revlimid and low-dose Decadron with good response to this treatment. 3. Status post autologous peripheral blood stem cell transplant at [facility] on Nov 20, 2007 under the care of Dr. Valarie Merino. 4. Status post treatment for disease recurrence with Velcade, Doxil and Decadron. Last dose given Nov 09, 2009. Discontinued secondary to intolerance but the patient had a good response to treatment at that time. 5. Status post palliative radiotherapy to the T2-T6 thoracic vertebrae completed 03/21/2011 under the care of Dr. Lisbeth Renshaw. 6. Systemic chemotherapy with Velcade 1.3 mg per meter squared given on days 1, 4, 8 and 11, and Doxil at 30 mg per meter squared given on day 4 in addition to Decadron status post 4 cycles, discontinued secondary to intolerance. 7. Systemic therapy with Velcade 1.3 mg/M2 subcutaneously in addition to Decadron 40 mg by mouth on a weekly basis, status post 20 cycles. The patient had good response with this treatment but it is discontinue today secondary to worsening peripheral neuropathy. 8. Palliative radiotherapy to the skull lesion as well as the left hip area under the care of Dr. Lisbeth Renshaw. 9. Systemic chemotherapy with Carfilzomib 20 mg/M2 on days 1, 2,  8, 9, 13 and 16 every 4 weeks in addition to weekly Decadron 40 mg by mouth. First dose on 04/19/2013. Status post 4 cycles, discontinued recently secondary to cardiac  dysfunction.  CURRENT THERAPY:  1. Pomalyst 3 mg by mouth daily for 21 days every 4 weeks in addition to dexamethasone 40 mg on a weekly basis. First dose started 12/02/2013. Status post 20 cycles. She will start cycle #21 on 08/14/2015. 2. Zometa 4 mg IV given every 3 months.   INTERVAL HISTORY: [name] 65 y.o. female returns to the clinic today for follow up visit accompanied by her daughter. The patient is feeling fine today with no specific complaints. She is tolerating her current treatment with Pomalyst fairly well with no significant adverse effects except for few episodes of diarrhea. She currently on Imodium and Lomotil. The patient denied having any significant chest pain, shortness of breath, or hemoptysis. She has no nausea or vomiting. She has no weight loss or night sweats. She continues to have mild peripheral neuropathy and currently on gabapentin. The patient had repeat CBC and comprehensive metabolic panel performed earlier today and she is here for evaluation and discussion of her lab results.  MEDICAL HISTORY: Past Medical History  Diagnosis Date  . Hypercholesterolemia   . Compression fracture 04/08/2007    pathologic compression fracture  . Hypothyroidism   . FHx: chemotherapy     s/p 5 cycle revlimid/low dose decadron,s/p velcade,doxil,decadron,  . Hx of radiation therapy 05/05/07-05/18/07,& 03/05/11-03/21/11-    l3&l5 in 2008, t2-t6 03/2011  . GERD (gastroesophageal reflux disease)   . Insomnia     associated with steroids  . Constipation     takes oxycontin,vicodin  . Hx of radiation therapy 05/05/2007 to 05/18/2007    palliative, L3-5  .  Hx of radiation therapy 03/05/2011 to 03/21/2011    palliative T2-T6, c-spine  . History of autologous stem cell transplant ([place]) 11/20/2007    [facility], Dr Valarie Merino  . PONV (postoperative nausea and vomiting)   . Multiple myeloma ([facility]) dx'd 2009  . Metastasis to bone ([place])   . Family history of anesthesia complication      "daughter gets PONV too"  . Pneumonia     "several times"  . Stroke [facility]) 2014    denies residual on 01/27/2014    ALLERGIES:  has No Known Allergies.  MEDICATIONS:  Current Outpatient Prescriptions  Medication Sig Dispense Refill  . ALPRAZolam (XANAX) 0.5 MG tablet Take 1/2 to 1 tablet 2 to 3 x daily as needed for nerves (Patient taking differently: Take 0.5 mg by mouth 3 (three) times daily as needed. ) 90 tablet 5  . aspirin 81 MG tablet Take 81 mg by mouth daily.    . citalopram (CELEXA) 40 MG tablet Take 1 tablet (40 mg total) by mouth daily. (Patient taking differently: Take 20 mg by mouth daily. ) 90 tablet 2  . cyclobenzaprine (FLEXERIL) 10 MG tablet TAKE 1/2 TO 1 TABLET BY MOUTH THREE TIMES DAILY FOR MUSCLE SPASMS 90 tablet 2  . dexamethasone (DECADRON) 4 MG tablet TAKE (10) TABLETS BY MOUTH EVERY FRIDAY 40 tablet 1  . diazepam (VALIUM) 10 MG tablet Take 1 tablet (10 mg total) by mouth every 6 (six) hours as needed for anxiety. 30 tablet 2  . DIPHENHIST 12.5 MG/5ML liquid     . diphenoxylate-atropine (LOMOTIL) 2.5-0.025 MG tablet TAKE 2 TABLETS BY MOUTH 4 TIMES A DAY AS NEEDED FOR DIARRHEA 30 tablet 1  . Eszopiclone 3 MG TABS TAKE 1 TABLET BY MOUTH AT BEDTIME AS NEEDED FOR SLEEP 30 tablet 2  . furosemide (LASIX) 40 MG tablet Take lasix 80 mg daily in the morning and 40 mg in the afternoons.  Do not take after 5 pm. (Patient taking differently: Take 40-80 mg by mouth 2 (two) times daily. Take lasix 80 mg daily in the morning and 40 mg in the afternoons.  Do not take after 5 pm.) 90 tablet 3  . gabapentin (NEURONTIN) 600 MG tablet Take 600 mg by mouth 4 (four) times daily as needed (pain). For pain or cramps    . hyoscyamine (LEVSIN SL) 0.125 MG SL tablet Take 1 tablet (0.125 mg total) by mouth every 6 (six) hours as needed. 30 tablet 0  . KLOR-CON 10 10 MEQ tablet Take 10 mEq by mouth 2 (two) times daily.  3  . levothyroxine (SYNTHROID, LEVOTHROID) 100 MCG tablet Take 100 mcg by mouth  daily before breakfast.     . loperamide (IMODIUM) 2 MG capsule Take 2 mg by mouth as needed for diarrhea or loose stools.    . Magnesium Hydroxide 400 MG CHEW Chew 2 tablets by mouth daily.     . mometasone (NASONEX) 50 MCG/ACT nasal spray Place 2 sprays into the nose daily. 17 g 2  . montelukast (SINGULAIR) 10 MG tablet Take 1 tablet (10 mg total) by mouth daily. 30 tablet 2  . Multiple Vitamin (MULITIVITAMIN WITH MINERALS) TABS Take 1 tablet by mouth every morning.     . ondansetron (ZOFRAN-ODT) 8 MG disintegrating tablet TAKE 1 TABLET BY MOUTH EVERY 8 HOURS AS NEEDED FOR NAUSEA AND VOMITING 30 tablet 1  . pantoprazole (PROTONIX) 40 MG tablet TAKE 1 TABLET (40 MG TOTAL) BY MOUTH 2 (TWO) TIMES DAILY. 60 tablet 5  .  pomalidomide (POMALYST) 4 MG capsule Take 1 capsule (4 mg total) by mouth daily. Take with water on days 1-21. Repeat every 28 days.Josem Kaufmann [id number] on 07/06/15 21 capsule 0  . promethazine-dextromethorphan (PROMETHAZINE-DM) 6.25-15 MG/5ML syrup TAKE 5MLS BY MOUTH 4 TIMES A DAY AS NEEDED FOR COUGH 180 mL 0  . fexofenadine (ALLEGRA) 60 MG tablet Take 1 tablet daily as needed for allergies (Patient not taking: Reported on 08/07/2015) 90 tablet 1  . HYDROcodone-acetaminophen (NORCO) 7.5-325 MG tablet Take 1 tablet by mouth every 6 (six) hours as needed for moderate pain. 30 tablet 0  . meloxicam (MOBIC) 7.5 MG tablet Take 1 tablet (7.5 mg total) by mouth daily. (Patient not taking: Reported on 08/07/2015) 30 tablet 2  . morphine (MS CONTIN) 15 MG 12 hr tablet Take 1 tablet (15 mg total) by mouth every 12 (twelve) hours. 60 tablet 0   No current facility-administered medications for this visit.    SURGICAL HISTORY:  Past Surgical History  Procedure Laterality Date  . Knee arthroscopy Right     "put pin in"  . Knee arthroscopy Right     "took pin out and corrected what was wrong"  . Video bronchoscopy  07/30/2011    Procedure: VIDEO BRONCHOSCOPY WITHOUT FLUORO;  Surgeon: Kathee Delton, MD;   Location: [facility];  Service: Cardiopulmonary;  Laterality: Bilateral;  . Cholecystectomy  1980's  . Portacath placement Right 2009  . Vaginal hysterectomy  1980's    REVIEW OF SYSTEMS:  A comprehensive review of systems was negative except for: Constitutional: positive for fatigue Gastrointestinal: positive for diarrhea   PHYSICAL EXAMINATION: General appearance: alert, cooperative, fatigued and no distress Head: Normocephalic, without obvious abnormality, atraumatic Neck: no adenopathy, no JVD, supple, symmetrical, trachea midline and thyroid not enlarged, symmetric, no tenderness/mass/nodules Lymph nodes: Cervical, supraclavicular, and axillary nodes normal. Resp: clear to auscultation bilaterally Back: symmetric, no curvature. ROM normal. No CVA tenderness. Cardio: regular rate and rhythm, S1, S2 normal, no murmur, click, rub or gallop GI: soft, non-tender; bowel sounds normal; no masses,  no organomegaly Extremities: edema 1+ edema of left lower extremity. Neurologic: Alert and oriented X 3, normal strength and tone. Normal symmetric reflexes. Normal coordination and gait  ECOG PERFORMANCE STATUS: 1 - Symptomatic but completely ambulatory  Blood pressure 128/57, pulse 64, temperature 98.5 F (36.9 C), temperature source Oral, resp. rate 18, height _0  (1.626 m), weight 207 lb 8 oz (94.121 kg), SpO2 100 %.  LABORATORY DATA: Lab Results  Component Value Date   WBC 2.5* 08/07/2015   HGB 11.2* 08/07/2015   HCT 35.0 08/07/2015   MCV 100.4 08/07/2015   PLT 153 08/07/2015      Chemistry      Component Value Date/Time   NA 141 07/10/2015 1125   NA 137 05/05/2015 1400   K 3.4* 07/10/2015 1125   K 3.6 05/05/2015 1400   CL 104 05/05/2015 1400   CL 105 12/17/2012 1015   CO2 25 07/10/2015 1125   CO2 27 05/05/2015 1400   BUN 11.1 07/10/2015 1125   BUN 11 05/05/2015 1400   CREATININE 1.1 07/10/2015 1125   CREATININE 0.82 05/05/2015 1400   CREATININE 0.68 03/14/2015 1111       Component Value Date/Time   CALCIUM 8.7 07/10/2015 1125   CALCIUM 8.5* 05/05/2015 1400   ALKPHOS 74 07/10/2015 1125   ALKPHOS 74 03/14/2015 1111   AST 11 07/10/2015 1125   AST 10 03/14/2015 1111   ALT 11 07/10/2015 1125  ALT 11 03/14/2015 1111   BILITOT 0.75 07/10/2015 1125   BILITOT 1.0 03/14/2015 1111     Myeloma panel: Beta-2 microglobulin 2.06, free Light chain 1.92, free lambda light chain 6.03,/lambda ratio 0.32. IgG 979, IgG 127 and IgM 18.  RADIOGRAPHIC STUDIES: Mr Kizzie Fantasia Contrast  Aug 19, 2015  CLINICAL DATA:  Multiple myeloma. Known parietal lesion on the right. Confusion. Gait disturbance. Weakness. EXAM: MRI HEAD WITHOUT AND WITH CONTRAST TECHNIQUE: Multiplanar, multiecho pulse sequences of the brain and surrounding structures were obtained without and with intravenous contrast. CONTRAST:  55m MULTIHANCE GADOBENATE DIMEGLUMINE 529 MG/ML IV SOLN COMPARISON:  01/26/2014 MRI.  08/09/2013 CT. FINDINGS: Diffusion imaging does not show any acute or subacute infarction. The brainstem is normal. There is an old small vessel infarction of the inferior cerebellum on the right not seen previously. The cerebral hemispheres show mild changes of chronic small vessel disease throughout the white matter. No cortical or large vessel territory infarction. No intra-axial mass lesion, hemorrhage, hydrocephalus or extra-axial fluid collection. Chronic lytic lesion of the right parietal calvarium is unchanged measuring 37-38 mm. Internal fluid intensity material appears unchanged. No evidence of mass effect upon the brain. Scattered other small calvarial foci, most notable in the left parietal region measuring 10 mm is not significantly changed. Peripheral contrast enhancement appears the same. Some enhancement of the adjacent dura appears the same. No new skull or skullbase lesion is identified. IMPRESSION: No acute brain finding.  Mild chronic small-vessel ischemic changes. No change an multiple  calvarial lesions, the largest measuring 3.7-3.8 cm in diameter in the right parietal region. Some adjacent dural enhancement. No evidence of progressive/ recurrent disease. Electronically Signed   By: MNelson ChimesM.D.   On: 002/18/201711:06   ASSESSMENT AND PLAN:  This is a very pleasant 65years old [place] female with recurrent multiple myeloma recently completed a course of treatment with Velcade and Decadron with improvement in her disease but this was discontinued secondary to peripheral neuropathy. The patient tolerated her treatment with Carfilzomib and Decadron fairly well except for the recent shortness breath and cough which was felt to be secondary to congestive heart failure from her treatment with Carfilzomib. This treatment was discontinued. She was started on treatment with Pomalyst and Decadron status post 20 cycles. She tolerated the last cycle of her treatment well except for episodes of diarrhea which completely resolved at this point. She will have a week off before starting the next cycle of her treatment. Her CBC today showed mild neutropenia. MRI of the brain showed no evidence for progression of the lesion and the brain. She is expected to start cycle #21 next week. I would see the patient back for follow-up visit in one month for reevaluation with repeat myeloma panel before starting the next cycle of her treatment.  For the myeloma panel lesions, the patient will continue her current treatment with Zometa every 3 months as scheduled.  She was advised to call immediately if she has any concerning symptoms in the interval. The patient voices understanding of current disease status and treatment options and is in agreement with the current care plan.  All questions were answered. The patient knows to call the clinic with any problems, questions or concerns. We can certainly see the patient much sooner if necessary.  Disclaimer: This note was dictated with voice  recognition software. Similar sounding words can inadvertently be transcribed and may be missed upon review.

## 2015-08-07 NOTE — Telephone Encounter (Signed)
per pof to sch pt appt-gave pt copy of avs-sent MW email to sch pt zometa

## 2015-08-08 ENCOUNTER — Other Ambulatory Visit: Payer: Self-pay | Admitting: *Deleted

## 2015-08-08 DIAGNOSIS — C9000 Multiple myeloma not having achieved remission: Secondary | ICD-10-CM

## 2015-08-08 MED ORDER — POMALIDOMIDE 4 MG PO CAPS
4.0000 mg | ORAL_CAPSULE | Freq: Every day | ORAL | Status: DC
Start: 2015-08-08 — End: 2015-09-04

## 2015-08-08 NOTE — Telephone Encounter (Signed)
Rx for Pomalyst faxed and escribed to [facility]. Auth# [id number]

## 2015-08-10 ENCOUNTER — Other Ambulatory Visit: Payer: Self-pay | Admitting: Internal Medicine

## 2015-08-10 ENCOUNTER — Encounter: Payer: Self-pay | Admitting: Internal Medicine

## 2015-08-10 NOTE — Progress Notes (Signed)
Per [facility] pomalyst was shipped via [employer] 08/09/15

## 2015-08-12 ENCOUNTER — Other Ambulatory Visit: Payer: Self-pay | Admitting: Internal Medicine

## 2015-08-14 ENCOUNTER — Encounter: Payer: Self-pay | Admitting: Emergency Medicine

## 2015-08-15 ENCOUNTER — Other Ambulatory Visit: Payer: Self-pay | Admitting: Internal Medicine

## 2015-08-19 ENCOUNTER — Other Ambulatory Visit: Payer: Self-pay | Admitting: Internal Medicine

## 2015-08-19 DIAGNOSIS — F419 Anxiety disorder, unspecified: Secondary | ICD-10-CM

## 2015-08-28 ENCOUNTER — Ambulatory Visit (HOSPITAL_BASED_OUTPATIENT_CLINIC_OR_DEPARTMENT_OTHER): Payer: Medicare Other

## 2015-08-28 ENCOUNTER — Telehealth: Payer: Self-pay | Admitting: *Deleted

## 2015-08-28 ENCOUNTER — Ambulatory Visit (HOSPITAL_BASED_OUTPATIENT_CLINIC_OR_DEPARTMENT_OTHER): Payer: Medicare Other | Admitting: Nurse Practitioner

## 2015-08-28 ENCOUNTER — Ambulatory Visit (HOSPITAL_COMMUNITY)
Admission: RE | Admit: 2015-08-28 | Discharge: 2015-08-28 | Disposition: A | Discharge status: Home / Self Care | Payer: Medicare Other | Source: Ambulatory Visit | Attending: Nurse Practitioner | Admitting: Nurse Practitioner

## 2015-08-28 ENCOUNTER — Other Ambulatory Visit (HOSPITAL_BASED_OUTPATIENT_CLINIC_OR_DEPARTMENT_OTHER): Payer: Medicare Other

## 2015-08-28 VITALS — BP 124/60 | HR 70 | Temp 97.3°F | Resp 17 | Ht 64.0 in | Wt 209.9 lb

## 2015-08-28 DIAGNOSIS — R197 Diarrhea, unspecified: Secondary | ICD-10-CM | POA: Diagnosis not present

## 2015-08-28 DIAGNOSIS — R600 Localized edema: Secondary | ICD-10-CM | POA: Diagnosis not present

## 2015-08-28 DIAGNOSIS — W19XXXA Unspecified fall, initial encounter: Secondary | ICD-10-CM

## 2015-08-28 DIAGNOSIS — E860 Dehydration: Secondary | ICD-10-CM

## 2015-08-28 DIAGNOSIS — C9000 Multiple myeloma not having achieved remission: Secondary | ICD-10-CM

## 2015-08-28 DIAGNOSIS — R609 Edema, unspecified: Secondary | ICD-10-CM | POA: Diagnosis not present

## 2015-08-28 DIAGNOSIS — R110 Nausea: Secondary | ICD-10-CM

## 2015-08-28 LAB — COMPREHENSIVE METABOLIC PANEL
ALT: 16 U/L (ref 0–55)
AST: 15 U/L (ref 5–34)
Albumin: 3.2 g/dL — ABNORMAL LOW (ref 3.5–5.0)
Alkaline Phosphatase: 79 U/L (ref 40–150)
Anion Gap: 8 mEq/L (ref 3–11)
BUN: 6.9 mg/dL — ABNORMAL LOW (ref 7.0–26.0)
CHLORIDE: 110 meq/L — AB (ref 98–109)
CO2: 23 meq/L (ref 22–29)
CREATININE: 0.8 mg/dL (ref 0.6–1.1)
Calcium: 8.6 mg/dL (ref 8.4–10.4)
EGFR: 85 mL/min/{1.73_m2} — ABNORMAL LOW (ref >90)
GLUCOSE: 117 mg/dL (ref 70–140)
Potassium: 4 mEq/L (ref 3.5–5.1)
SODIUM: 141 meq/L (ref 136–145)
Total Bilirubin: 0.89 mg/dL (ref 0.20–1.20)
Total Protein: 6.5 g/dL (ref 6.4–8.3)

## 2015-08-28 LAB — LACTATE DEHYDROGENASE: LDH: 148 U/L (ref 125–245)

## 2015-08-28 LAB — CBC WITH DIFFERENTIAL/PLATELET
BASO%: 1.1 % (ref 0.0–2.0)
Basophils Absolute: 0 10*3/uL (ref 0.0–0.1)
EOS ABS: 0.1 10*3/uL (ref 0.0–0.5)
EOS%: 5.9 % (ref 0.0–7.0)
HCT: 34.9 % (ref 34.8–46.6)
HEMOGLOBIN: 11.3 g/dL — AB (ref 11.6–15.9)
LYMPH%: 37.9 % (ref 14.0–49.7)
MCH: 32.9 pg (ref 25.1–34.0)
MCHC: 32.5 g/dL (ref 31.5–36.0)
MCV: 101.2 fL — ABNORMAL HIGH (ref 79.5–101.0)
MONO#: 0.2 10*3/uL (ref 0.1–0.9)
MONO%: 9 % (ref 0.0–14.0)
NEUT%: 46.1 % (ref 38.4–76.8)
NEUTROS ABS: 1 10*3/uL — AB (ref 1.5–6.5)
PLATELETS: 179 10*3/uL (ref 145–400)
RBC: 3.45 10*6/uL — AB (ref 3.70–5.45)
RDW: 16.3 % — AB (ref 11.2–14.5)
WBC: 2.2 10*3/uL — AB (ref 3.9–10.3)
lymph#: 0.8 10*3/uL — ABNORMAL LOW (ref 0.9–3.3)

## 2015-08-28 MED ORDER — SODIUM CHLORIDE 0.9 % IV SOLN
INTRAVENOUS | Status: AC
  Administered 2015-08-28: 13:00:00 via INTRAVENOUS

## 2015-08-28 MED ORDER — SODIUM CHLORIDE 0.9 % IV SOLN
Freq: Once | INTRAVENOUS | Status: DC
Start: 2015-08-28 — End: 2015-08-29

## 2015-08-28 MED ORDER — DIPHENOXYLATE-ATROPINE 2.5-0.025 MG PO TABS: ORAL_TABLET | ORAL | Status: DC

## 2015-08-28 NOTE — Telephone Encounter (Signed)
Pt filled out a walk in form with complaints of weakness, fatigue, pain and diarrhea. Pt reports the diarrhea is not watery but this has been common after her treatment. She states food and water touch her lips and she has to run to the bathroom. Pt states she feels dehydrated today. She is requesting to be seen in [facility] and get IVF

## 2015-08-28 NOTE — Progress Notes (Signed)
*  PRELIMINARY RESULTS* Vascular Ultrasound Left lower extremity venous duplex has been completed.  Preliminary findings: No evidence of DVT or baker's cyst.  Called results to [facility].   Landry Mellow, RDMS, RVT  08/28/2015, 3:34 PM

## 2015-08-28 NOTE — Telephone Encounter (Signed)
err

## 2015-08-29 ENCOUNTER — Encounter: Payer: Self-pay | Admitting: Nurse Practitioner

## 2015-08-29 ENCOUNTER — Telehealth: Payer: Self-pay | Admitting: Internal Medicine

## 2015-08-29 ENCOUNTER — Other Ambulatory Visit: Payer: Self-pay | Admitting: Medical Oncology

## 2015-08-29 DIAGNOSIS — R112 Nausea with vomiting, unspecified: Secondary | ICD-10-CM | POA: Insufficient documentation

## 2015-08-29 DIAGNOSIS — R600 Localized edema: Secondary | ICD-10-CM | POA: Insufficient documentation

## 2015-08-29 DIAGNOSIS — R609 Edema, unspecified: Secondary | ICD-10-CM | POA: Insufficient documentation

## 2015-08-29 LAB — KAPPA/LAMBDA LIGHT CHAINS
Ig Kappa Free Light Chain: 21.43 mg/L — ABNORMAL HIGH (ref 3.30–19.40)
Ig Lambda Free Light Chain: 96.96 mg/L — ABNORMAL HIGH (ref 5.71–26.30)
KAPPA/LAMBDA FLC RATIO: 0.22 — AB (ref 0.26–1.65)

## 2015-08-29 LAB — IGG, IGA, IGM
IGG (IMMUNOGLOBIN G), SERUM: 943 mg/dL (ref 700–1600)
IGM (IMMUNOGLOBIN M), SRM: 14 mg/dL — AB (ref 26–217)
IgA, Qn, Serum: 119 mg/dL (ref 87–352)

## 2015-08-29 LAB — BETA 2 MICROGLOBULIN, SERUM: BETA 2: 2.2 mg/L (ref 0.6–2.4)

## 2015-08-29 NOTE — Assessment & Plan Note (Signed)
Patient is complaining of chronic nausea; but no vomiting.  She has anti--nausea medications at home to take on an as-needed basis.  Most likely, and chronic nausea secondary to her Pomalyst oral therapy.

## 2015-08-29 NOTE — Assessment & Plan Note (Signed)
Patient states that she has been suffering with chronic diarrhea for the past several weeks.  She has been alternating both Lomotil and Imodium with only minimal effectiveness.  Most likely, patient's diarrhea is secondary to her Pomalyst; but will also order a stool for C. difficile as well.  Patient was given a refill of the Lomotil per her request today.  She was instructed that she may take Imodium 2 tablets alternating with Lomotil 2 tablets for her diarrhea.  Also, patient may need additional IV fluid rehydration this week.  Patient was advised to call/return if she develops any worsening symptoms as well.

## 2015-08-29 NOTE — Telephone Encounter (Signed)
per pof to sch pt appt-sent MW email to sch trmt-will call pt after reply

## 2015-08-29 NOTE — Progress Notes (Signed)
SYMPTOM MANAGEMENT CLINIC   HPI: [name] 65 y.o. female diagnosed with multiple myeloma.  Currently undergoing Pomalyst oral therapy and Zometa infusions.  Patient presents to the [facility] today with complain of some chronic diarrhea and subsequent dehydration.  She has been alternating to both Lomotil and Imodium with only minimal effectiveness.  She is also suffering with some chronic nausea.  She has chronic bilateral lower extremity edema; and takes Lasix 3 times per day per primary care provider.  Also, patient states that she has become increasingly weak recently; and has become unsteady on her feet.  She fallen a few times within the past few weeks as well.  She denies any known injury or trauma.  When she falls.  She denies any hitting of her head or loss of consciousness.  Patient also denies any recent fevers or chills or other new symptoms.   HPI  Review of Systems  Constitutional: Positive for malaise/fatigue.  Respiratory: Positive for shortness of breath.   Musculoskeletal: Positive for falls.  Neurological: Positive for weakness.  All other systems reviewed and are negative.   Past Medical History  Diagnosis Date  . Hypercholesterolemia   . Compression fracture 04/08/2007    pathologic compression fracture  . Hypothyroidism   . FHx: chemotherapy     s/p 5 cycle revlimid/low dose decadron,s/p velcade,doxil,decadron,  . Hx of radiation therapy 05/05/07-05/18/07,& 03/05/11-03/21/11-    l3&l5 in 2008, t2-t6 03/2011  . GERD (gastroesophageal reflux disease)   . Insomnia     associated with steroids  . Constipation     takes oxycontin,vicodin  . Hx of radiation therapy 05/05/2007 to 05/18/2007    palliative, L3-5  . Hx of radiation therapy 03/05/2011 to 03/21/2011    palliative T2-T6, c-spine  . 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 PONV (postoperative nausea and vomiting)   . Multiple myeloma ([place]) dx'd 2009    . Metastasis to bone ([place])   . Family history of anesthesia complication     "daughter gets PONV too"  . Pneumonia     "several times"  . Stroke [facility]) 2014    denies residual on 01/27/2014    Past Surgical History  Procedure Laterality Date  . Knee arthroscopy Right     "put pin in"  . Knee arthroscopy Right     "took pin out and corrected what was wrong"  . Video bronchoscopy  07/30/2011    Procedure: VIDEO BRONCHOSCOPY WITHOUT FLUORO;  Surgeon: Kathee Delton, MD;  Location: [facility];  Service: Cardiopulmonary;  Laterality: Bilateral;  . Cholecystectomy  1980's  . Portacath placement Right 2009  . Vaginal hysterectomy  1980's    has Multiple myeloma ([place]); Hypothyroidism; GERD; Essential hypertension; History of autologous stem cell transplant ([place]); Hyperlipidemia; Medication management; Vitamin D deficiency; CKD stage 3 due to type 2 diabetes mellitus ([place]); Pain and swelling of left lower extremity; Encounter for antineoplastic chemotherapy; Lytic bone lesions on xray; Diarrhea; Dehydration; Peripheral edema; Nausea without vomiting; and Fall on her problem list.    has No Known Allergies.    Medication List       This list is accurate as of: 08/28/15 11:59 PM.  Always use your most recent med list.               ALPRAZolam 0.5 MG tablet  Commonly known as:  XANAX  TAKE 1/2-1 TABLET 2-3 TIMES A DAY AS NEEDED FOR ANXIETY  aspirin 81 MG tablet  Take 81 mg by mouth daily.     citalopram 40 MG tablet  Commonly known as:  CELEXA  Take 1 tablet (40 mg total) by mouth daily.     cyclobenzaprine 10 MG tablet  Commonly known as:  FLEXERIL  TAKE 1/2 TO 1 TABLET BY MOUTH THREE TIMES DAILY FOR MUSCLE SPASMS     dexamethasone 4 MG tablet  Commonly known as:  DECADRON  TAKE (10) TABLETS BY MOUTH EVERY FRIDAY     DIPHENHIST 12.5 MG/5ML liquid  Generic drug:  diphenhydrAMINE     diphenoxylate-atropine 2.5-0.025 MG tablet  Commonly known as:  LOMOTIL  TAKE 2  TABLETS BY MOUTH 4 TIMES A DAY AS NEEDED FOR DIARRHEA     Eszopiclone 3 MG Tabs  TAKE 1 TABLET BY MOUTH AT BEDTIME AS NEEDED FOR SLEEP     fexofenadine 60 MG tablet  Commonly known as:  ALLEGRA  Take 1 tablet daily as needed for allergies     furosemide 40 MG tablet  Commonly known as:  LASIX  Take lasix 80 mg daily in the morning and 40 mg in the afternoons.  Do not take after 5 pm.     gabapentin 600 MG tablet  Commonly known as:  NEURONTIN  Take 600 mg by mouth 4 (four) times daily as needed (pain). For pain or cramps     gabapentin 600 MG tablet  Commonly known as:  NEURONTIN  TAKE 1 TABLET 4 X DAILY AS NEEDED FOR PAIN OR CRAMPS     HYDROcodone-acetaminophen 7.5-325 MG tablet  Commonly known as:  NORCO  Take 1 tablet by mouth every 6 (six) hours as needed for moderate pain.     hyoscyamine 0.125 MG SL tablet  Commonly known as:  LEVSIN SL  Take 1 tablet (0.125 mg total) by mouth every 6 (six) hours as needed.     KLOR-CON 10 10 MEQ tablet  Generic drug:  potassium chloride  Take 10 mEq by mouth 2 (two) times daily.     levothyroxine 100 MCG tablet  Commonly known as:  SYNTHROID, LEVOTHROID  Take 100 mcg by mouth daily before breakfast.     loperamide 2 MG capsule  Commonly known as:  IMODIUM  Take 2 mg by mouth as needed for diarrhea or loose stools.     Magnesium Hydroxide 400 MG Chew  Chew 2 tablets by mouth daily.     meloxicam 7.5 MG tablet  Commonly known as:  MOBIC  Take 1 tablet (7.5 mg total) by mouth daily.     mometasone 50 MCG/ACT nasal spray  Commonly known as:  NASONEX  Place 2 sprays into the nose daily.     montelukast 10 MG tablet  Commonly known as:  SINGULAIR  Take 1 tablet (10 mg total) by mouth daily.     morphine 15 MG 12 hr tablet  Commonly known as:  MS CONTIN  Take 1 tablet (15 mg total) by mouth every 12 (twelve) hours.     multivitamin with minerals Tabs tablet  Take 1 tablet by mouth every morning.     ondansetron 8 MG  disintegrating tablet  Commonly known as:  ZOFRAN-ODT  TAKE 1 TABLET BY MOUTH EVERY 8 HOURS AS NEEDED FOR NAUSEA AND VOMITING     pantoprazole 40 MG tablet  Commonly known as:  PROTONIX  TAKE 1 TABLET (40 MG TOTAL) BY MOUTH 2 (TWO) TIMES DAILY.     pomalidomide 4 MG capsule  Commonly known as:  POMALYST  Take 1 capsule (4 mg total) by mouth daily. Take with water on days 1-21. Repeat every 28 days.Josem Kaufmann [id number] 08/08/15     promethazine-dextromethorphan 6.25-15 MG/5ML syrup  Commonly known as:  PROMETHAZINE-DM  TAKE 5MLS BY MOUTH 4 TIMES A DAY AS NEEDED FOR COUGH         PHYSICAL EXAMINATION  Oncology Vitals 08/28/2015 08/07/2015  Height 163 cm 163 cm  Weight 95.21 kg 94.121 kg  Weight (lbs) 209 lbs 14 oz 207 lbs 8 oz  BMI (kg/m2) 36.03 kg/m2 35.62 kg/m2  Temp 97.3 98.5  Pulse 70 64  Resp 17 18  SpO2 99 100  BSA (m2) 2.07 m2 2.06 m2   BP Readings from Last 2 Encounters:  08/28/15 124/60  08/07/15 128/57    Physical Exam  Constitutional: She is oriented to person, place, and time and well-developed, well-nourished, and in no distress.  HENT:  Head: Normocephalic and atraumatic.  Mouth/Throat: Oropharynx is clear and moist.  Eyes: Conjunctivae and EOM are normal. Pupils are equal, round, and reactive to light. Right eye exhibits no discharge. Left eye exhibits no discharge. No scleral icterus.  Neck: Normal range of motion. Neck supple. No JVD present. No tracheal deviation present. No thyromegaly present.  Cardiovascular: Normal rate, regular rhythm, normal heart sounds and intact distal pulses.   Pulmonary/Chest: Effort normal and breath sounds normal. No respiratory distress. She has no wheezes. She has no rales. She exhibits no tenderness.  Abdominal: Soft. Bowel sounds are normal. She exhibits no distension and no mass. There is no tenderness. There is no rebound and no guarding.  Musculoskeletal: Normal range of motion. She exhibits edema. She exhibits no tenderness.  +2  edema to bilateral lower extremities; with the left leg slightly larger than the right.  Lymphadenopathy:    She has no cervical adenopathy.  Neurological: She is alert and oriented to person, place, and time.  Patient appeared unsteady on her feet; and does require assistance with ambulation today.  Skin: Skin is warm and dry. No rash noted. No erythema. No pallor.  Psychiatric: Affect normal.    LABORATORY DATA:. Appointment on 08/28/2015  Component Date Value Ref Range Status  . WBC 08/28/2015 2.2* 3.9 - 10.3 10e3/uL Final  . NEUT# 08/28/2015 1.0* 1.5 - 6.5 10e3/uL Final  . HGB 08/28/2015 11.3* 11.6 - 15.9 g/dL Final  . HCT 08/28/2015 34.9  34.8 - 46.6 % Final  . Platelets 08/28/2015 179  145 - 400 10e3/uL Final  . MCV 08/28/2015 101.2* 79.5 - 101.0 fL Final  . MCH 08/28/2015 32.9  25.1 - 34.0 pg Final  . MCHC 08/28/2015 32.5  31.5 - 36.0 g/dL Final  . RBC 08/28/2015 3.45* 3.70 - 5.45 10e6/uL Final  . RDW 08/28/2015 16.3* 11.2 - 14.5 % Final  . lymph# 08/28/2015 0.8* 0.9 - 3.3 10e3/uL Final  . MONO# 08/28/2015 0.2  0.1 - 0.9 10e3/uL Final  . Eosinophils Absolute 08/28/2015 0.1  0.0 - 0.5 10e3/uL Final  . Basophils Absolute 08/28/2015 0.0  0.0 - 0.1 10e3/uL Final  . NEUT% 08/28/2015 46.1  38.4 - 76.8 % Final  . LYMPH% 08/28/2015 37.9  14.0 - 49.7 % Final  . MONO% 08/28/2015 9.0  0.0 - 14.0 % Final  . EOS% 08/28/2015 5.9  0.0 - 7.0 % Final  . BASO% 08/28/2015 1.1  0.0 - 2.0 % Final  . Sodium 08/28/2015 141  136 - 145 mEq/L Final  . Potassium 08/28/2015 4.0  3.5 - 5.1 mEq/L Final  . Chloride 08/28/2015 110* 98 - 109 mEq/L Final  . CO2 08/28/2015 23  22 - 29 mEq/L Final  . Glucose 08/28/2015 117  70 - 140 mg/dl Final   Glucose reference range is for nonfasting patients. Fasting glucose reference range is 70- 100.  Marland Kitchen BUN 08/28/2015 6.9* 7.0 - 26.0 mg/dL Final  . Creatinine 08/28/2015 0.8  0.6 - 1.1 mg/dL Final  . Total Bilirubin 08/28/2015 0.89  0.20 - 1.20 mg/dL Final  .  Alkaline Phosphatase 08/28/2015 79  40 - 150 U/L Final  . AST 08/28/2015 15  5 - 34 U/L Final  . ALT 08/28/2015 16  0 - 55 U/L Final  . Total Protein 08/28/2015 6.5  6.4 - 8.3 g/dL Final  . Albumin 08/28/2015 3.2* 3.5 - 5.0 g/dL Final  . Calcium 08/28/2015 8.6  8.4 - 10.4 mg/dL Final  . Anion Gap 08/28/2015 8  3 - 11 mEq/L Final  . EGFR 08/28/2015 85* >90 ml/min/1.73 m2 Final   eGFR is calculated using the CKD-EPI Creatinine Equation (2009)  . LDH 08/28/2015 148  125 - 245 U/L Final  . IgG, Qn, Serum 08/28/2015 943  700 - 1600 mg/dL Final  . IgA, Qn, Serum 08/28/2015 119  87 - 352 mg/dL Final  . IgM, Qn, Serum 08/28/2015 14* 26 - 217 mg/dL Final   Result confirmed on concentration.  . Beta-2 08/28/2015 2.2  0.6 - 2.4 mg/L Final   Doppler US: *PRELIMINARY RESULTS* Vascular Ultrasound Left lower extremity venous duplex has been completed. Preliminary findings: No evidence of DVT or baker's cyst.  Called results to [place]  RADIOGRAPHIC STUDIES: No results found.  ASSESSMENT/PLAN:    Peripheral edema Patient has a history of chronic bilateral lower extremity edema; and takes Lasix 40 mg 3 times per day per her primary care provider.  She also reports chronic mild shortness of breath with any exertion.  She denies any chest pain, chest pressure,  or pain with inspiration.  Exam today reveals bilateral lower extremity edema; with the left lower extremity slightly larger than the right.  Doppler ultrasound obtained today was negative for DVT.  Patient was advised to continue elevating her legs above the level of for heart when ever she is resting.  Also, patient was reminded to remain as active as possible.  Advised patient that she should follow up with her primary care provider regarding her Lasix dosage.  May need to consider changing/increasing patient's diuretic medications.    Nausea without vomiting Patient is complaining of chronic nausea; but no vomiting.  She has anti--nausea  medications at home to take on an as-needed basis.  Most likely, and chronic nausea secondary to her Pomalyst oral therapy.  Multiple myeloma ([place]) Patient continues to take the Pomalyst oral therapy on days 1 through 21; with one week off following each 3 week cycle.  She is on day #8 of her pomalyst cycle now.  She received her last Zometa infusion on 08/07/2015.  Blood counts obtained today revealed WBC of 2.2, ANC 1.0, hemoglobin 11.3, platelet count 179.  Reviewed all neutropenia guidelines with both patient and her family today.  Vital signs are stable and patient is afebrile with temp  97.3 while at the cancer center.  Patient will need to be scheduled for labs, visit, and her next Zometa infusion on 09/04/2015.   Diarrhea Patient states that she has been suffering with chronic diarrhea for the past several weeks.  She has been  alternating both Lomotil and Imodium with only minimal effectiveness.  Most likely, patient's diarrhea is secondary to her Pomalyst; but will also order a stool for C. difficile as well.  Patient was given a refill of the Lomotil per her request today.  She was instructed that she may take Imodium 2 tablets alternating with Lomotil 2 tablets for her diarrhea.  Also, patient may need additional IV fluid rehydration this week.  Patient was advised to call/return if she develops any worsening symptoms as well.  Dehydration Patient's and been suffering with chronic diarrhea and poor oral intake recently.  Patient appears dehydrated; we'll received approximately 1 L normal saline IV fluid rehydration while at the cancer Center today.  Patient was also encouraged to push fluids at home.  Patient may very well need to return later this week for additional IV fluid rehydration.  Fall Patient states that she has come much weaker within the past few weeks; and is more unsteady on her feet.  She is actually fallen a few different times within the past few weeks as  well.  She denies hitting her head or any loss of consciousness.  When she falls.  She states that she often feels more unsteady when she first gets up from a sitting position.  Exam today.  Did refill patient's slightly unsteady on her feet; and she did require some assistance with basic ambulation.  Patient did not have either a cane or a walker with her today; but states that she does have a walker in the car.  Provider.  Advised nurse caring for patient today.  The patient should be in a wheelchair for any further ambulation today.  Also, patient was advised to use her walker in the future to help prevent falls.  Most likely, unsteadiness is secondary to dehydration and/or increased diuretics; with subsequent periods of orthostatic hypotension.  Have advised patient that she may want to follow-up with her primary care provider to review her diuretics and her chronic peripheral edema.     Patient stated understanding of all instructions; and was in agreement with this plan of care. The patient knows to call the clinic with any problems, questions or concerns.   Review/collaboration with Dr. Julien Nordmann regarding all aspects of patient's visit today.   Total time spent with patient was 40 minutes;  with greater than 75 percent of that time spent in face to face counseling regarding patient's symptoms,  and coordination of care and follow up.  Disclaimer:This dictation was prepared with Dragon/digital dictation along with [employer]. Any transcriptional errors that result from this process are unintentional.  Drue Second, NP 08/29/2015

## 2015-08-29 NOTE — Assessment & Plan Note (Signed)
Patient has a history of chronic bilateral lower extremity edema; and takes Lasix 40 mg 3 times per day per her primary care provider.  She also reports chronic mild shortness of breath with any exertion.  She denies any chest pain, chest pressure,  or pain with inspiration.  Exam today reveals bilateral lower extremity edema; with the left lower extremity slightly larger than the right.  Doppler ultrasound obtained today was negative for DVT.  Patient was advised to continue elevating her legs above the level of for heart when ever she is resting.  Also, patient was reminded to remain as active as possible.  Advised patient that she should follow up with her primary care provider regarding her Lasix dosage.  May need to consider changing/increasing patient's diuretic medications.

## 2015-08-29 NOTE — Assessment & Plan Note (Signed)
Patient continues to take the Pomalyst oral therapy on days 1 through 21; with one week off following each 3 week cycle.  She is on day #8 of her pomalyst cycle now.  She received her last Zometa infusion on 08/07/2015.  Blood counts obtained today revealed WBC of 2.2, ANC 1.0, hemoglobin 11.3, platelet count 179.  Reviewed all neutropenia guidelines with both patient and her family today.  Vital signs are stable and patient is afebrile with temp  97.3 while at the cancer center.  Patient will need to be scheduled for labs, visit, and her next Zometa infusion on 09/04/2015.

## 2015-08-29 NOTE — Assessment & Plan Note (Signed)
Patient's and been suffering with chronic diarrhea and poor oral intake recently.  Patient appears dehydrated; we'll received approximately 1 L normal saline IV fluid rehydration while at the cancer Center today.  Patient was also encouraged to push fluids at home.  Patient may very well need to return later this week for additional IV fluid rehydration.

## 2015-08-29 NOTE — Assessment & Plan Note (Signed)
Patient states that she has come much weaker within the past few weeks; and is more unsteady on her feet.  She is actually fallen a few different times within the past few weeks as well.  She denies hitting her head or any loss of consciousness.  When she falls.  She states that she often feels more unsteady when she first gets up from a sitting position.  Exam today.  Did refill patient's slightly unsteady on her feet; and she did require some assistance with basic ambulation.  Patient did not have either a cane or a walker with her today; but states that she does have a walker in the car.  Provider.  Advised nurse caring for patient today.  The patient should be in a wheelchair for any further ambulation today.  Also, patient was advised to use her walker in the future to help prevent falls.  Most likely, unsteadiness is secondary to dehydration and/or increased diuretics; with subsequent periods of orthostatic hypotension.  Have advised patient that she may want to follow-up with her primary care provider to review her diuretics and her chronic peripheral edema.

## 2015-08-30 ENCOUNTER — Telehealth: Payer: Self-pay | Admitting: *Deleted

## 2015-08-30 ENCOUNTER — Telehealth: Payer: Self-pay | Admitting: Internal Medicine

## 2015-08-30 NOTE — Telephone Encounter (Signed)
Per staff message and POF I have scheduled appts. Advised scheduler of appts. JMW  

## 2015-08-30 NOTE — Telephone Encounter (Signed)
per pof to sch pt appt-cld & left pt a message of time & date of appt °

## 2015-08-30 NOTE — Telephone Encounter (Signed)
LM on pt home phone for rtn call- check status following [facility] visit 08/28/15.  Doppler results are also negative. Check status of diarrhea.

## 2015-08-31 ENCOUNTER — Ambulatory Visit (INDEPENDENT_AMBULATORY_CARE_PROVIDER_SITE_OTHER): Payer: Medicare Other | Admitting: Internal Medicine

## 2015-08-31 ENCOUNTER — Encounter: Payer: Self-pay | Admitting: Internal Medicine

## 2015-08-31 VITALS — BP 124/62 | HR 80 | Temp 97.8°F | Resp 18 | Ht 65.0 in | Wt 209.0 lb

## 2015-08-31 DIAGNOSIS — C9000 Multiple myeloma not having achieved remission: Secondary | ICD-10-CM

## 2015-08-31 DIAGNOSIS — K529 Noninfective gastroenteritis and colitis, unspecified: Secondary | ICD-10-CM | POA: Diagnosis not present

## 2015-08-31 DIAGNOSIS — R609 Edema, unspecified: Secondary | ICD-10-CM | POA: Diagnosis not present

## 2015-08-31 MED ORDER — CHOLESTYRAMINE 4 G PO PACK: 1.0000 | PACK | Freq: Two times a day (BID) | ORAL | Status: DC

## 2015-08-31 MED ORDER — METOLAZONE 2.5 MG PO TABS: ORAL_TABLET | ORAL | Status: DC

## 2015-08-31 NOTE — Patient Instructions (Addendum)
Please take 1 tablet of zaroxolyn 2.5 mg on Wednesday and Sunday.  On these days please only take 40 mg of lasix on this day.  This medication is a booster to help make the lasix work better.   Katrina will call you about getting physical therapy set up.  You should hear from her in the next week.  I will send a message to Dr. Julien Nordmann about your pain medications.  Please wait until you get your stool sample back before you start taking the cholestyramine powder twice daily.  Please take this medication 2-3 hours after your normal medicines to prevent it from stopping your body from absorbing the medications.     You can take aspirin when your chest hurts and use warm compresses.

## 2015-08-31 NOTE — Progress Notes (Signed)
Subjective:    Patient ID: [name], female    DOB: 07-20-1950, 65 y.o.   MRN: [id number]  HPI  Patient presents with her daughter Baxter Flattery for evaluation of diarrhea and leg swelling.  She reports that for the past several months she has been having diarrhea that is watery.  She reports that she is almost having some bowel incontinence which is causing her to wear pampers.  She reports that they are giving her both immodium and lomotil and also levsin which does tend to help for an hour.  She does have 10-15 times.  She reports that sometimes the cramping abdominal pain does tend to relieve some of the pain, but sometimes it does not.  She reports that she does get some back pain with it.  She is not currently eating any raw foods.    She is also having swelling in her leg.  She has been trying to take her fluid pills but feels like she is not urinating much.  Her daughter reports that she is not eating much and only drinks boost.    She is very fatigued lately.  She cries a lot during the day and sleeps a lot of the time.  She is feeling a lot more stressed out.  Her daughter has invited her to live with her.    Review of Systems  Constitutional: Positive for fatigue. Negative for fever and chills.  Respiratory: Positive for cough. Negative for chest tightness, shortness of breath and wheezing.   Cardiovascular: Positive for leg swelling. Negative for chest pain and palpitations.  Gastrointestinal: Positive for abdominal pain and diarrhea. Negative for nausea, vomiting and constipation.  Genitourinary: Negative for dysuria, urgency, frequency, hematuria and difficulty urinating.  Musculoskeletal: Negative for arthralgias.       Objective:   Physical Exam  Constitutional: She is oriented to person, place, and time. She appears well-developed and well-nourished. No distress.  HENT:  Head: Normocephalic.  Mouth/Throat: Oropharynx is clear and moist. No oropharyngeal exudate.  Eyes:  Conjunctivae and EOM are normal. Pupils are equal, round, and reactive to light. No scleral icterus.  Neck: Normal range of motion. Neck supple. No JVD present. No thyromegaly present.  Cardiovascular: Normal rate, regular rhythm and intact distal pulses.  Exam reveals no gallop and no friction rub.   No murmur heard. Pulmonary/Chest: Effort normal and breath sounds normal. No respiratory distress. She has no wheezes. She has no rales. She exhibits no tenderness.  Abdominal: Soft. Bowel sounds are normal. She exhibits no distension and no mass. There is generalized tenderness. There is no rebound and no guarding.  Musculoskeletal: Normal range of motion.  Lymphadenopathy:    She has no cervical adenopathy.  Neurological: She is alert and oriented to person, place, and time. No cranial nerve deficit. Coordination normal.  Skin: She is not diaphoretic.  Psychiatric: Her speech is normal and behavior is normal. Judgment and thought content normal. Cognition and memory are normal. She exhibits a depressed mood.  Nursing note and vitals reviewed.   Filed Vitals:   08/31/15 1121  BP: 124/62  Pulse: 80  Temp: 97.8 F (36.6 C)  Resp: 18   Wt Readings from Last 3 Encounters:  08/31/15 209 lb (94.802 kg)  08/28/15 209 lb 14.4 oz (95.21 kg)  08/07/15 207 lb 8 oz (94.121 kg)         Assessment & Plan:    1. Chronic diarrhea -stool tests ordered by Oncology -encouraged patient to return  them so they can run fecal fat, stool pathogen panel and O&P -cholestyramine only to be started after stool tests return  2. Multiple myeloma not having achieved remission ([place]) -feel that pain MS Contin dose may be too high and this is partially related to why she is unsteady on her feet and also why she is sleeping so much -do recommend decreasing dose and using breakthrough medication prn -feel that disease state is causing depression -encouraged patient to take advantage of the cancer center  therapist for counseling of depression.  3. Peripheral edema -likely neurogenic -cont lasix -zaroxolyn 2.5 mg on Wed and Sund and on these days only take 40 mg of lasix.   -recheck BMET next week at visit with Dr. Julien Nordmann. -patient to call if issues  Recheck in 1 month as she does have visit with oncology next week.  Will send message to Dr. Julien Nordmann.  Over 40 minutes of exam, counseling, chart review and high complex critical decision making was performed

## 2015-09-04 ENCOUNTER — Ambulatory Visit: Payer: Medicare Other

## 2015-09-04 ENCOUNTER — Ambulatory Visit (HOSPITAL_BASED_OUTPATIENT_CLINIC_OR_DEPARTMENT_OTHER): Payer: Medicare Other | Admitting: Internal Medicine

## 2015-09-04 ENCOUNTER — Telehealth: Payer: Self-pay | Admitting: Internal Medicine

## 2015-09-04 ENCOUNTER — Encounter: Payer: Self-pay | Admitting: Internal Medicine

## 2015-09-04 ENCOUNTER — Other Ambulatory Visit (HOSPITAL_BASED_OUTPATIENT_CLINIC_OR_DEPARTMENT_OTHER): Payer: Medicare Other

## 2015-09-04 VITALS — BP 122/61 | HR 64 | Temp 98.3°F | Resp 18 | Ht 65.0 in | Wt 206.1 lb

## 2015-09-04 DIAGNOSIS — C9000 Multiple myeloma not having achieved remission: Secondary | ICD-10-CM

## 2015-09-04 DIAGNOSIS — C9002 Multiple myeloma in relapse: Secondary | ICD-10-CM | POA: Diagnosis not present

## 2015-09-04 DIAGNOSIS — W19XXXA Unspecified fall, initial encounter: Secondary | ICD-10-CM

## 2015-09-04 DIAGNOSIS — E860 Dehydration: Secondary | ICD-10-CM

## 2015-09-04 DIAGNOSIS — R197 Diarrhea, unspecified: Secondary | ICD-10-CM

## 2015-09-04 DIAGNOSIS — R110 Nausea: Secondary | ICD-10-CM

## 2015-09-04 DIAGNOSIS — Z5111 Encounter for antineoplastic chemotherapy: Secondary | ICD-10-CM

## 2015-09-04 DIAGNOSIS — R609 Edema, unspecified: Secondary | ICD-10-CM

## 2015-09-04 LAB — CBC WITH DIFFERENTIAL/PLATELET
BASO%: 1.2 % (ref 0.0–2.0)
BASOS ABS: 0 10*3/uL (ref 0.0–0.1)
EOS ABS: 0.1 10*3/uL (ref 0.0–0.5)
EOS%: 5 % (ref 0.0–7.0)
HCT: 36 % (ref 34.8–46.6)
HGB: 11.5 g/dL — ABNORMAL LOW (ref 11.6–15.9)
LYMPH%: 42.7 % (ref 14.0–49.7)
MCH: 32.6 pg (ref 25.1–34.0)
MCHC: 32.1 g/dL (ref 31.5–36.0)
MCV: 101.6 fL — AB (ref 79.5–101.0)
MONO#: 0.5 10*3/uL (ref 0.1–0.9)
MONO%: 17.7 % — ABNORMAL HIGH (ref 0.0–14.0)
NEUT#: 0.9 10*3/uL — ABNORMAL LOW (ref 1.5–6.5)
NEUT%: 33.4 % — AB (ref 38.4–76.8)
PLATELETS: 199 10*3/uL (ref 145–400)
RBC: 3.54 10*6/uL — AB (ref 3.70–5.45)
RDW: 16.3 % — ABNORMAL HIGH (ref 11.2–14.5)
WBC: 2.8 10*3/uL — ABNORMAL LOW (ref 3.9–10.3)
lymph#: 1.2 10*3/uL (ref 0.9–3.3)

## 2015-09-04 LAB — COMPREHENSIVE METABOLIC PANEL
ALT: 12 U/L (ref 0–55)
ANION GAP: 8 meq/L (ref 3–11)
AST: 12 U/L (ref 5–34)
Albumin: 3.3 g/dL — ABNORMAL LOW (ref 3.5–5.0)
Alkaline Phosphatase: 48 U/L (ref 40–150)
BUN: 9.6 mg/dL (ref 7.0–26.0)
CALCIUM: 9.6 mg/dL (ref 8.4–10.4)
CHLORIDE: 107 meq/L (ref 98–109)
CO2: 27 meq/L (ref 22–29)
Creatinine: 0.9 mg/dL (ref 0.6–1.1)
EGFR: 77 mL/min/{1.73_m2} — AB (ref >90)
Glucose: 85 mg/dl (ref 70–140)
POTASSIUM: 3.5 meq/L (ref 3.5–5.1)
Sodium: 143 mEq/L (ref 136–145)
Total Bilirubin: 0.86 mg/dL (ref 0.20–1.20)
Total Protein: 6.5 g/dL (ref 6.4–8.3)

## 2015-09-04 MED ORDER — HYDROCODONE-ACETAMINOPHEN 7.5-325 MG PO TABS: 1.0000 | ORAL_TABLET | Freq: Four times a day (QID) | ORAL | Status: DC | PRN

## 2015-09-04 MED ORDER — MORPHINE SULFATE ER 15 MG PO TBCR: 15.0000 mg | EXTENDED_RELEASE_TABLET | Freq: Two times a day (BID) | ORAL | Status: DC

## 2015-09-04 MED ORDER — POMALIDOMIDE 3 MG PO CAPS: 3.0000 mg | ORAL_CAPSULE | Freq: Every day | ORAL | Status: DC

## 2015-09-04 NOTE — Progress Notes (Signed)
[place] [phone]   Fax:([phone]) [place], [place] [address]  DIAGNOSIS: Recurrent multiple myeloma initially diagnosed in October 2008.   PRIOR THERAPY:  1. Status post palliative radiotherapy to the lumbar spine between L3 and L5. The patient received a total dose of 3000 cGy in 10 fractions under the care of Dr. Lisbeth Renshaw between May 05, 2007 through May 18, 2007. 2. Status post 5 cycles of systemic chemotherapy with Revlimid and low-dose Decadron with good response to this treatment. 3. Status post autologous peripheral blood stem cell transplant at [facility] on Nov 20, 2007 under the care of Dr. Valarie Merino. 4. Status post treatment for disease recurrence with Velcade, Doxil and Decadron. Last dose given Nov 09, 2009. Discontinued secondary to intolerance but the patient had a good response to treatment at that time. 5. Status post palliative radiotherapy to the T2-T6 thoracic vertebrae completed 03/21/2011 under the care of Dr. Lisbeth Renshaw. 6. Systemic chemotherapy with Velcade 1.3 mg per meter squared given on days 1, 4, 8 and 11, and Doxil at 30 mg per meter squared given on day 4 in addition to Decadron status post 4 cycles, discontinued secondary to intolerance. 7. Systemic therapy with Velcade 1.3 mg/M2 subcutaneously in addition to Decadron 40 mg by mouth on a weekly basis, status post 20 cycles. The patient had good response with this treatment but it is discontinue today secondary to worsening peripheral neuropathy. 8. Palliative radiotherapy to the skull lesion as well as the left hip area under the care of Dr. Lisbeth Renshaw. 9. Systemic chemotherapy with Carfilzomib 20 mg/M2 on days 1, 2,  8, 9, 13 and 16 every 4 weeks in addition to weekly Decadron 40 mg by mouth. First dose on 04/19/2013. Status post 4 cycles, discontinued recently secondary to cardiac  dysfunction.  CURRENT THERAPY:  1. Pomalyst 3 mg by mouth daily for 21 days every 4 weeks in addition to dexamethasone 40 mg on a weekly basis. First dose started 12/02/2013. Status post 21 cycles. She will start cycle #22 on 09/11/2015. 2. Zometa 4 mg IV given every 3 months.   INTERVAL HISTORY: [name] 65 y.o. female returns to the clinic today for follow up visit accompanied by her daughter and mother. The patient is feeling fine today with no specific complaints. She is tolerating her current treatment with Pomalyst fairly well with no significant adverse effects except for several episodes of diarrhea. She was treated with Imodium and Lomotil. She was also seen by her primary care physician and started on Questran. She feels much better today and no significant diarrhea. She has few episodes of confusion and worsening fatigue. The patient denied having any significant chest pain, shortness of breath, or hemoptysis. She has no nausea or vomiting. She has no weight loss or night sweats. She continues to have mild peripheral neuropathy and currently on gabapentin. She had repeat myeloma panel performed recently and she is here for evaluation and discussion of her lab results.  MEDICAL HISTORY: Past Medical History  Diagnosis Date  . Hypercholesterolemia   . Compression fracture 04/08/2007    pathologic compression fracture  . Hypothyroidism   . FHx: chemotherapy     s/p 5 cycle revlimid/low dose decadron,s/p velcade,doxil,decadron,  . Hx of radiation therapy 05/05/07-05/18/07,& 03/05/11-03/21/11-    l3&l5 in 2008, t2-t6 03/2011  . GERD (gastroesophageal reflux disease)   . Insomnia  associated with steroids  . Constipation     takes oxycontin,vicodin  . Hx of radiation therapy 05/05/2007 to 05/18/2007    palliative, L3-5  . Hx of radiation therapy 03/05/2011 to 03/21/2011    palliative T2-T6, c-spine  . History of autologous stem cell transplant ([place]) 11/20/2007    [facility], Dr  Valarie Merino  . PONV (postoperative nausea and vomiting)   . Multiple myeloma ([place]) dx'd 2009  . Metastasis to bone ([place])   . Family history of anesthesia complication     "daughter gets PONV too"  . Pneumonia     "several times"  . Stroke [facility]) 2014    denies residual on 01/27/2014    ALLERGIES:  has No Known Allergies.  MEDICATIONS:  Current Outpatient Prescriptions  Medication Sig Dispense Refill  . ALPRAZolam (XANAX) 0.5 MG tablet TAKE 1/2-1 TABLET 2-3 TIMES A DAY AS NEEDED FOR ANXIETY 90 tablet 0  . aspirin 81 MG tablet Take 81 mg by mouth daily.    . cholestyramine (QUESTRAN) 4 g packet Take 1 packet by mouth 2 (two) times daily. Please take 2-3 hours after taking your regular medications 60 each 11  . citalopram (CELEXA) 40 MG tablet Take 1 tablet (40 mg total) by mouth daily. (Patient taking differently: Take 20 mg by mouth daily. ) 90 tablet 2  . cyclobenzaprine (FLEXERIL) 10 MG tablet TAKE 1/2 TO 1 TABLET BY MOUTH THREE TIMES DAILY FOR MUSCLE SPASMS 90 tablet 2  . dexamethasone (DECADRON) 4 MG tablet TAKE (10) TABLETS BY MOUTH EVERY FRIDAY 40 tablet 1  . DIPHENHIST 12.5 MG/5ML liquid     . diphenoxylate-atropine (LOMOTIL) 2.5-0.025 MG tablet TAKE 2 TABLETS BY MOUTH 4 TIMES A DAY AS NEEDED FOR DIARRHEA 30 tablet 1  . Eszopiclone 3 MG TABS TAKE 1 TABLET BY MOUTH AT BEDTIME AS NEEDED FOR SLEEP 30 tablet 2  . furosemide (LASIX) 40 MG tablet Take lasix 80 mg daily in the morning and 40 mg in the afternoons.  Do not take after 5 pm. (Patient taking differently: Take 40-80 mg by mouth 2 (two) times daily. Take lasix 80 mg daily in the morning and 40 mg in the afternoons.  Do not take after 5 pm.) 90 tablet 3  . gabapentin (NEURONTIN) 600 MG tablet TAKE 1 TABLET 4 X DAILY AS NEEDED FOR PAIN OR CRAMPS 120 tablet 4  . HYDROcodone-acetaminophen (NORCO) 7.5-325 MG tablet Take 1 tablet by mouth every 6 (six) hours as needed for moderate pain. 30 tablet 0  . hyoscyamine (LEVSIN SL) 0.125 MG SL  tablet Take 1 tablet (0.125 mg total) by mouth every 6 (six) hours as needed. 30 tablet 0  . KLOR-CON 10 10 MEQ tablet Take 10 mEq by mouth 2 (two) times daily.  3  . levothyroxine (SYNTHROID, LEVOTHROID) 100 MCG tablet Take 100 mcg by mouth daily before breakfast.     . loperamide (IMODIUM) 2 MG capsule Take 2 mg by mouth as needed for diarrhea or loose stools.    . Magnesium Hydroxide 400 MG CHEW Chew 2 tablets by mouth daily.     . meloxicam (MOBIC) 7.5 MG tablet Take 1 tablet (7.5 mg total) by mouth daily. 30 tablet 2  . metolazone (ZAROXOLYN) 2.5 MG tablet Please take 1 tablet on Wednesday and Sunday in the morning for swelling. 30 tablet 1  . mometasone (NASONEX) 50 MCG/ACT nasal spray Place 2 sprays into the nose daily. 17 g 2  . montelukast (SINGULAIR) 10 MG tablet Take 1  tablet (10 mg total) by mouth daily. 30 tablet 2  . morphine (MS CONTIN) 15 MG 12 hr tablet Take 1 tablet (15 mg total) by mouth every 12 (twelve) hours. 60 tablet 0  . Multiple Vitamin (MULITIVITAMIN WITH MINERALS) TABS Take 1 tablet by mouth every morning.     . ondansetron (ZOFRAN-ODT) 8 MG disintegrating tablet TAKE 1 TABLET BY MOUTH EVERY 8 HOURS AS NEEDED FOR NAUSEA AND VOMITING 30 tablet 1  . pantoprazole (PROTONIX) 40 MG tablet TAKE 1 TABLET (40 MG TOTAL) BY MOUTH 2 (TWO) TIMES DAILY. 60 tablet 5  . pomalidomide (POMALYST) 4 MG capsule Take 1 capsule (4 mg total) by mouth daily. Take with water on days 1-21. Repeat every 28 days.Josem Kaufmann [id number] 08/08/15 21 capsule 0  . promethazine-dextromethorphan (PROMETHAZINE-DM) 6.25-15 MG/5ML syrup TAKE 5MLS BY MOUTH 4 TIMES A DAY AS NEEDED FOR COUGH 180 mL 0  . fexofenadine (ALLEGRA) 60 MG tablet Take 1 tablet daily as needed for allergies 90 tablet 1   No current facility-administered medications for this visit.    SURGICAL HISTORY:  Past Surgical History  Procedure Laterality Date  . Knee arthroscopy Right     "put pin in"  . Knee arthroscopy Right     "took pin out and  corrected what was wrong"  . Video bronchoscopy  07/30/2011    Procedure: VIDEO BRONCHOSCOPY WITHOUT FLUORO;  Surgeon: Kathee Delton, MD;  Location: [facility];  Service: Cardiopulmonary;  Laterality: Bilateral;  . Cholecystectomy  1980's  . Portacath placement Right 2009  . Vaginal hysterectomy  1980's    REVIEW OF SYSTEMS:  Constitutional: positive for fatigue Eyes: negative Ears, nose, mouth, throat, and face: negative Respiratory: negative Cardiovascular: negative Gastrointestinal: positive for diarrhea Genitourinary:negative Integument/breast: negative Hematologic/lymphatic: negative Musculoskeletal:positive for arthralgias and back pain Neurological: positive for headaches Behavioral/Psych: negative Endocrine: negative Allergic/Immunologic: negative   PHYSICAL EXAMINATION: General appearance: alert, cooperative, fatigued and no distress Head: Normocephalic, without obvious abnormality, atraumatic Neck: no adenopathy, no JVD, supple, symmetrical, trachea midline and thyroid not enlarged, symmetric, no tenderness/mass/nodules Lymph nodes: Cervical, supraclavicular, and axillary nodes normal. Resp: clear to auscultation bilaterally Back: symmetric, no curvature. ROM normal. No CVA tenderness. Cardio: regular rate and rhythm, S1, S2 normal, no murmur, click, rub or gallop GI: soft, non-tender; bowel sounds normal; no masses,  no organomegaly Extremities: edema 1+ edema of left lower extremity. Neurologic: Alert and oriented X 3, normal strength and tone. Normal symmetric reflexes. Normal coordination and gait  ECOG PERFORMANCE STATUS: 1 - Symptomatic but completely ambulatory  Blood pressure 122/61, pulse 64, temperature 98.3 F (36.8 C), temperature source Oral, resp. rate 18, height 5' 5"  (1.651 m), weight 206 lb 1.6 oz (93.486 kg), SpO2 99 %.  LABORATORY DATA: Lab Results  Component Value Date   WBC 2.8* 09/04/2015   HGB 11.5* 09/04/2015   HCT 36.0 09/04/2015   MCV  101.6* 09/04/2015   PLT 199 09/04/2015      Chemistry      Component Value Date/Time   NA 141 08/28/2015 1051   NA 137 05/05/2015 1400   K 4.0 08/28/2015 1051   K 3.6 05/05/2015 1400   CL 104 05/05/2015 1400   CL 105 12/17/2012 1015   CO2 23 08/28/2015 1051   CO2 27 05/05/2015 1400   BUN 6.9* 08/28/2015 1051   BUN 11 05/05/2015 1400   CREATININE 0.8 08/28/2015 1051   CREATININE 0.82 05/05/2015 1400   CREATININE 0.68 03/14/2015 1111      Component  Value Date/Time   CALCIUM 8.6 08/28/2015 1051   CALCIUM 8.5* 05/05/2015 1400   ALKPHOS 79 08/28/2015 1051   ALKPHOS 74 03/14/2015 1111   AST 15 08/28/2015 1051   AST 10 03/14/2015 1111   ALT 16 08/28/2015 1051   ALT 11 03/14/2015 1111   BILITOT 0.89 08/28/2015 1051   BILITOT 1.0 03/14/2015 1111     Myeloma panel: Beta-2 microglobulin 2.2, free Light chain 21.43, free lambda light chain 96.96, kappa/lambda ratio 0.22. IgG 943, IgG 119 and IgM 14.  RADIOGRAPHIC STUDIES: No results found. ASSESSMENT AND PLAN:  This is a very pleasant 66 years old [place] American female with recurrent multiple myeloma recently completed a course of treatment with Velcade and Decadron with improvement in her disease but this was discontinued secondary to peripheral neuropathy. The patient tolerated her treatment with Carfilzomib and Decadron fairly well except for the recent shortness breath and cough which was felt to be secondary to congestive heart failure from her treatment with Carfilzomib. This treatment was discontinued. She was started on treatment with Pomalyst and Decadron status post 21 cycles. She tolerated the last cycle of her treatment well except for episodes of diarrhea last week and treated with Imodium, Lomotil and Questran. It is completely resolved at this point.  Her recent myeloma panel showed no significant evidence for disease progression. I discussed the lab result with the patient and her family. I recommended for her to  continue her current treatment with Pomalyst but 3 mg by mouth daily for 21 days every 4 weeks in addition to Decadron 20 mg by mouth weekly. The patient has a follow-up appointment with Dr. Evelene Croon at [facility] for reevaluation and I will be waiting for any further recommendation from her side. She is expected to start cycle #22 next week. I would see the patient back for follow-up visit in one month for reevaluation with repeat CBC, comprehensive metabolic panel and LDH. For the myeloma panel bone lesions, the patient will continue her current treatment with Zometa every 3 months as scheduled.  She was advised to call immediately if she has any concerning symptoms in the interval. The patient voices understanding of current disease status and treatment options and is in agreement with the current care plan.  All questions were answered. The patient knows to call the clinic with any problems, questions or concerns. We can certainly see the patient much sooner if necessary.  Disclaimer: This note was dictated with voice recognition software. Similar sounding words can inadvertently be transcribed and may be missed upon review.

## 2015-09-04 NOTE — Telephone Encounter (Signed)
Gave and printed appt sched and avs for pt for April °

## 2015-09-05 ENCOUNTER — Ambulatory Visit: Payer: Medicare Other | Admitting: Physical Therapy

## 2015-09-05 DIAGNOSIS — C9000 Multiple myeloma not having achieved remission: Secondary | ICD-10-CM | POA: Diagnosis not present

## 2015-09-05 DIAGNOSIS — H40012 Open angle with borderline findings, low risk, left eye: Secondary | ICD-10-CM | POA: Diagnosis not present

## 2015-09-06 ENCOUNTER — Encounter: Payer: Self-pay | Admitting: Internal Medicine

## 2015-09-06 ENCOUNTER — Other Ambulatory Visit: Payer: Self-pay | Admitting: *Deleted

## 2015-09-06 DIAGNOSIS — C9001 Multiple myeloma in remission: Secondary | ICD-10-CM | POA: Diagnosis not present

## 2015-09-06 DIAGNOSIS — C9000 Multiple myeloma not having achieved remission: Secondary | ICD-10-CM | POA: Diagnosis not present

## 2015-09-06 DIAGNOSIS — Z5111 Encounter for antineoplastic chemotherapy: Secondary | ICD-10-CM

## 2015-09-06 DIAGNOSIS — G629 Polyneuropathy, unspecified: Secondary | ICD-10-CM | POA: Diagnosis not present

## 2015-09-06 DIAGNOSIS — B370 Candidal stomatitis: Secondary | ICD-10-CM | POA: Diagnosis not present

## 2015-09-06 DIAGNOSIS — I10 Essential (primary) hypertension: Secondary | ICD-10-CM | POA: Diagnosis not present

## 2015-09-06 DIAGNOSIS — G893 Neoplasm related pain (acute) (chronic): Secondary | ICD-10-CM | POA: Diagnosis not present

## 2015-09-06 DIAGNOSIS — Z79899 Other long term (current) drug therapy: Secondary | ICD-10-CM | POA: Diagnosis not present

## 2015-09-06 DIAGNOSIS — R5383 Other fatigue: Secondary | ICD-10-CM | POA: Diagnosis not present

## 2015-09-06 MED ORDER — POMALIDOMIDE 3 MG PO CAPS: 3.0000 mg | ORAL_CAPSULE | Freq: Every day | ORAL | Status: DC

## 2015-09-07 ENCOUNTER — Ambulatory Visit: Payer: Medicare Other | Attending: Internal Medicine | Admitting: Physical Therapy

## 2015-09-07 ENCOUNTER — Ambulatory Visit: Payer: Medicare Other | Admitting: Physical Therapy

## 2015-09-07 DIAGNOSIS — I890 Lymphedema, not elsewhere classified: Secondary | ICD-10-CM

## 2015-09-08 ENCOUNTER — Ambulatory Visit: Payer: Medicare Other | Admitting: Physical Therapy

## 2015-09-08 ENCOUNTER — Encounter: Payer: Self-pay | Admitting: Internal Medicine

## 2015-09-08 NOTE — Progress Notes (Signed)
I sent message to [name] to sent [facility] new script for pomalyst for patient.

## 2015-09-08 NOTE — Therapy (Signed)
[place], [place], [address] Phone: [phone]   Fax:  [phone]  Physical Therapy Evaluation  Patient Details  Name: [name] MRN: [id number] Date of Birth: May 15, 1951 Referring Provider: Starlyn Skeans   Encounter Date: 09/07/2015      PT End of Session - 09/07/15 1756    Visit Number 1   PT Start Time 1100   PT Stop Time 1145   PT Time Calculation (min) 45 min   Activity Tolerance Patient tolerated treatment well   Behavior During Therapy [facility] for tasks assessed/performed      Past Medical History  Diagnosis Date  . Hypercholesterolemia   . Compression fracture 04/08/2007    pathologic compression fracture  . Hypothyroidism   . FHx: chemotherapy     s/p 5 cycle revlimid/low dose decadron,s/p velcade,doxil,decadron,  . Hx of radiation therapy 05/05/07-05/18/07,& 03/05/11-03/21/11-    l3&l5 in 2008, t2-t6 03/2011  . GERD (gastroesophageal reflux disease)   . Insomnia     associated with steroids  . Constipation     takes oxycontin,vicodin  . Hx of radiation therapy 05/05/2007 to 05/18/2007    palliative, L3-5  . Hx of radiation therapy 03/05/2011 to 03/21/2011    palliative T2-T6, c-spine  . History of autologous stem cell transplant ([place]) 11/20/2007    [facility], Dr Valarie Merino  . PONV (postoperative nausea and vomiting)   . Multiple myeloma ([place]) dx'd 2009  . Metastasis to bone ([place])   . Family history of anesthesia complication     "daughter gets PONV too"  . Pneumonia     "several times"  . Stroke [facility]) 2014    denies residual on 01/27/2014    Past Surgical History  Procedure Laterality Date  . Knee arthroscopy Right     "put pin in"  . Knee arthroscopy Right     "took pin out and corrected what was wrong"  . Video bronchoscopy  07/30/2011    Procedure: VIDEO BRONCHOSCOPY WITHOUT FLUORO;  Surgeon: Kathee Delton, MD;  Location: [facility];  Service: Cardiopulmonary;   Laterality: Bilateral;  . Cholecystectomy  1980's  . Portacath placement Right 2009  . Vaginal hysterectomy  1980's    There were no vitals filed for this visit.  Visit Diagnosis:  Lymphedema - Plan: PT plan of care cert/re-cert      Subjective Assessment - 09/07/15 1119    Subjective pt reports she has problems with her balance and with swelling of left leg that comes and goes.     Pertinent History multiple myeloma for 10 years and has had treatment with chemotherapy in the past and is going to "take a pill" starting Monday Has had radiation to back and head.  She said she had a stroke about a year ago.    Patient Stated Goals wants to improve balance and walk with out a cane and help with swelling in leg.    Currently in Pain? No/denies               LYMPHEDEMA/ONCOLOGY QUESTIONNAIRE - 09/07/15 1132    What other symptoms do you have   Are you Having Heaviness or Tightness Yes  at times    Is it Hard or Difficult finding clothes that fit Yes  at time s   Stemmer Sign No   Lymphedema Stage   Stage STAGE 1 SPONTANEOUSLY REVERSIBLE   Right Lower Extremity Lymphedema   At Midpatella/Popliteal Crease 41 cm   30 cm  Proximal to Floor at Lateral Plantar Foot 36 cm   20 cm Proximal to Floor at Lateral Plantar Foot 27.9 1   10  cm Proximal to Floor at Lateral Malleoli 24 cm   5 cm Proximal to 1st MTP Joint 25 cm   Around Proximal Great Toe 9.5 cm   Left Lower Extremity Lymphedema   30 cm Proximal to Floor at Lateral Plantar Foot 36.5 cm   20 cm Proximal to Floor at Lateral Plantar Foot 28.5 cm   10 cm Proximal to Floor at Lateral Malleoli 25 cm   5 cm Proximal to 1st MTP Joint 25.2 cm   Around Proximal Great Toe 9.4 cm                        PT Education - September 28, 2015 1754    Education provided Yes   Education Details phone number for contact at [place] and price for Circaid bandaging alternative    Person(s) Educated Patient   Methods  Explanation;Handout   Comprehension Verbalized understanding                [facility] Goals - 09/28/2015 1803    CC Long Term Goal  #1   Title pt will know where to get a bandaging alternative garment for treatment of lymphedema exacerbations at home    Time 1   Period Days   Status Achieved            Plan - 28-Sep-2015 1757    Clinical Impression Statement Pt comes in today for evaluation of her left leg swelling, but she reports to day is a good day and she does not have significant swelling on evaluation.  She also has concerns about her balance but does not have the $35 copays to return for treatment so this was not addressed.  Reminded pt about the [employer] program that she has attended before and aquatic exercise that would help with her strength as well as her swelling.  Pt has compression stockings that she wears daily.  Recommended a bandageing alternative garment (Circaid by [facility]) that she can get at [facility] that she could easitly apply to acheive reduction when her swelling is more pronounced .  She has the infomration and will not return to our clinic for treatment at this time    Pt will benefit from skilled therapeutic intervention in order to improve on the following deficits Increased edema   Rehab Potential Good   Clinical Impairments Affecting Rehab Potential previous chemotherapy and radiation    PT Frequency One time visit   PT Treatment/Interventions Patient/family education   PT Next Visit Plan no follow up.  encouraged pt to call us back if she has questions or is ready to come for ongoing treatment to address balance problems    Consulted and Agree with Plan of Care Patient          G-Codes - Sep 28, 2015 1803    Functional Assessment Tool Used lymphedema life impact scale    Functional Limitation Self care   Self Care Current Status (S5681) At least 60 percent but less than 80 percent impaired, limited or restricted   Self Care Goal  Status (E7517) At least 60 percent but less than 80 percent impaired, limited or restricted   Self Care Discharge Status [phone]) At least 60 percent but less than 80 percent impaired, limited or restricted       Problem List Patient Active Problem  List   Diagnosis Date Noted  . Multiple myeloma not having achieved remission ([place]) 09/04/2015  . Peripheral edema 08/29/2015  . Nausea without vomiting 08/29/2015  . Fall 08/29/2015  . Diarrhea 07/09/2015  . Dehydration 07/09/2015  . Lytic bone lesions on xray 06/12/2015  . Encounter for antineoplastic chemotherapy 01/16/2015  . Pain and swelling of left lower extremity 12/15/2014  . Medication management 11/04/2013  . Vitamin D deficiency 11/04/2013  . CKD stage 3 due to type 2 diabetes mellitus ([place]) 11/04/2013  . Hyperlipidemia 02/24/2013  . History of autologous stem cell transplant ([place])   . Multiple myeloma ([place]) 01/12/2010  . Hypothyroidism 01/12/2010  . GERD 01/12/2010  . Essential hypertension 01/12/2010   Donato Heinz. Owens Shark, PT   09/08/2015, 12:43 PM  [place], [place], [address] Phone: [phone]   Fax:  [phone]  Name: ANDE THERRELL MRN: [id number] Date of Birth: 06-13-1951

## 2015-09-11 ENCOUNTER — Other Ambulatory Visit: Payer: Self-pay | Admitting: Medical Oncology

## 2015-09-11 NOTE — Telephone Encounter (Addendum)
Message left for [name] at [facility] that per [facility] pt pomalyst is to be 3 mg daily x 21 days and to continue decadron .Pt instructed the same- he will reevaluate dose change at next visit.

## 2015-09-12 ENCOUNTER — Ambulatory Visit: Payer: Self-pay | Admitting: Internal Medicine

## 2015-09-12 NOTE — Telephone Encounter (Signed)
TC received from patient stating that she has not received her pomalyst yet. Please call patient with update.

## 2015-09-12 NOTE — Telephone Encounter (Signed)
I again told pt that she is to take '3mg'$  for 21 days and rest for 7  I also called back [facility] to fill rx. as ordered

## 2015-09-13 ENCOUNTER — Other Ambulatory Visit: Payer: Self-pay | Admitting: Internal Medicine

## 2015-09-13 ENCOUNTER — Encounter: Payer: Self-pay | Admitting: Internal Medicine

## 2015-09-13 DIAGNOSIS — G4700 Insomnia, unspecified: Secondary | ICD-10-CM

## 2015-09-13 MED ORDER — ESZOPICLONE 3 MG PO TABS: 3.0000 mg | ORAL_TABLET | Freq: Every evening | ORAL | Status: DC | PRN

## 2015-09-13 NOTE — Progress Notes (Signed)
Per [facility] pomalyst shipped 09/12/15 via [employer]

## 2015-09-17 ENCOUNTER — Other Ambulatory Visit: Payer: Self-pay | Admitting: Internal Medicine

## 2015-09-17 ENCOUNTER — Other Ambulatory Visit: Payer: Self-pay | Admitting: Physician Assistant

## 2015-09-20 ENCOUNTER — Other Ambulatory Visit: Payer: Self-pay | Admitting: Internal Medicine

## 2015-09-20 ENCOUNTER — Telehealth: Payer: Self-pay | Admitting: Medical Oncology

## 2015-09-20 ENCOUNTER — Telehealth: Payer: Self-pay | Admitting: Internal Medicine

## 2015-09-20 DIAGNOSIS — C9000 Multiple myeloma not having achieved remission: Secondary | ICD-10-CM

## 2015-09-20 MED ORDER — AZITHROMYCIN 250 MG PO TABS: ORAL_TABLET | ORAL | Status: DC

## 2015-09-20 MED ORDER — PREDNISONE 20 MG PO TABS: ORAL_TABLET | ORAL | Status: DC

## 2015-09-20 MED ORDER — PROMETHAZINE-DM 6.25-15 MG/5ML PO SYRP: ORAL_SOLUTION | ORAL | Status: DC

## 2015-09-20 NOTE — Telephone Encounter (Signed)
Still having a lot of diarrhea , 1/2 watery , 1/2 semi-formed stool. She still has to bring in stool specimen for c diff.  She thought it had to be watery to bring it in and I  told her she can bring it in any form. . She has upper respiratory symptoms, nasal congestion , cough and tired. Denies fever. Onc tx request sent for [facility] tomorrow, labs first. Per Julien Nordmann pt appt for [facility] and hold pomalyst. Onc tx request sent

## 2015-09-20 NOTE — Telephone Encounter (Signed)
Spoke with patient re [place] 3/23 @ 9:45 am. Patient also made aware that she is to have a cxr @ [facility] tomorrow prior to [place] at approximately 8:45 am - walk in.

## 2015-09-21 ENCOUNTER — Ambulatory Visit (HOSPITAL_COMMUNITY)
Admission: RE | Admit: 2015-09-21 | Discharge: 2015-09-21 | Disposition: A | Discharge status: Home / Self Care | Payer: Medicare Other | Source: Ambulatory Visit | Attending: Internal Medicine | Admitting: Internal Medicine

## 2015-09-21 ENCOUNTER — Ambulatory Visit (HOSPITAL_BASED_OUTPATIENT_CLINIC_OR_DEPARTMENT_OTHER): Payer: Medicare Other | Admitting: Nurse Practitioner

## 2015-09-21 ENCOUNTER — Other Ambulatory Visit (HOSPITAL_COMMUNITY)
Admission: RE | Admit: 2015-09-21 | Discharge: 2015-09-21 | Disposition: A | Discharge status: Home / Self Care | Payer: Medicare Other | Source: Ambulatory Visit | Attending: Nurse Practitioner | Admitting: Nurse Practitioner

## 2015-09-21 ENCOUNTER — Other Ambulatory Visit: Payer: Self-pay | Admitting: Nurse Practitioner

## 2015-09-21 ENCOUNTER — Ambulatory Visit (HOSPITAL_COMMUNITY)
Admission: RE | Admit: 2015-09-21 | Discharge: 2015-09-21 | Disposition: A | Discharge status: Home / Self Care | Payer: Medicare Other | Source: Ambulatory Visit | Attending: Nurse Practitioner | Admitting: Nurse Practitioner

## 2015-09-21 ENCOUNTER — Other Ambulatory Visit (HOSPITAL_BASED_OUTPATIENT_CLINIC_OR_DEPARTMENT_OTHER): Payer: Medicare Other

## 2015-09-21 VITALS — BP 133/72 | HR 77 | Temp 98.2°F | Resp 20 | Ht 65.0 in | Wt 202.1 lb

## 2015-09-21 DIAGNOSIS — R918 Other nonspecific abnormal finding of lung field: Secondary | ICD-10-CM | POA: Diagnosis not present

## 2015-09-21 DIAGNOSIS — J40 Bronchitis, not specified as acute or chronic: Secondary | ICD-10-CM | POA: Diagnosis not present

## 2015-09-21 DIAGNOSIS — R112 Nausea with vomiting, unspecified: Secondary | ICD-10-CM

## 2015-09-21 DIAGNOSIS — Z5111 Encounter for antineoplastic chemotherapy: Secondary | ICD-10-CM

## 2015-09-21 DIAGNOSIS — R110 Nausea: Secondary | ICD-10-CM

## 2015-09-21 DIAGNOSIS — E876 Hypokalemia: Secondary | ICD-10-CM

## 2015-09-21 DIAGNOSIS — C9000 Multiple myeloma not having achieved remission: Secondary | ICD-10-CM | POA: Diagnosis not present

## 2015-09-21 DIAGNOSIS — E860 Dehydration: Secondary | ICD-10-CM

## 2015-09-21 DIAGNOSIS — R600 Localized edema: Secondary | ICD-10-CM

## 2015-09-21 DIAGNOSIS — R609 Edema, unspecified: Secondary | ICD-10-CM

## 2015-09-21 DIAGNOSIS — W19XXXA Unspecified fall, initial encounter: Secondary | ICD-10-CM

## 2015-09-21 DIAGNOSIS — R197 Diarrhea, unspecified: Secondary | ICD-10-CM | POA: Diagnosis not present

## 2015-09-21 LAB — CBC WITH DIFFERENTIAL/PLATELET
BASO%: 0.8 % (ref 0.0–2.0)
Basophils Absolute: 0 10*3/uL (ref 0.0–0.1)
EOS ABS: 0.1 10*3/uL (ref 0.0–0.5)
EOS%: 3.1 % (ref 0.0–7.0)
HCT: 35.2 % (ref 34.8–46.6)
HEMOGLOBIN: 11.6 g/dL (ref 11.6–15.9)
LYMPH%: 31.2 % (ref 14.0–49.7)
MCH: 33 pg (ref 25.1–34.0)
MCHC: 33 g/dL (ref 31.5–36.0)
MCV: 100.3 fL (ref 79.5–101.0)
MONO#: 0.3 10*3/uL (ref 0.1–0.9)
MONO%: 8.7 % (ref 0.0–14.0)
NEUT%: 56.2 % (ref 38.4–76.8)
NEUTROS ABS: 1.8 10*3/uL (ref 1.5–6.5)
PLATELETS: 214 10*3/uL (ref 145–400)
RBC: 3.51 10*6/uL — AB (ref 3.70–5.45)
RDW: 14.8 % — AB (ref 11.2–14.5)
WBC: 3.1 10*3/uL — AB (ref 3.9–10.3)
lymph#: 1 10*3/uL (ref 0.9–3.3)

## 2015-09-21 LAB — COMPREHENSIVE METABOLIC PANEL
ALK PHOS: 78 U/L (ref 40–150)
ALT: 17 U/L (ref 0–55)
ANION GAP: 10 meq/L (ref 3–11)
AST: 17 U/L (ref 5–34)
Albumin: 3.6 g/dL (ref 3.5–5.0)
BILIRUBIN TOTAL: 0.84 mg/dL (ref 0.20–1.20)
BUN: 15.1 mg/dL (ref 7.0–26.0)
CO2: 35 meq/L — AB (ref 22–29)
Calcium: 9.5 mg/dL (ref 8.4–10.4)
Chloride: 95 mEq/L — ABNORMAL LOW (ref 98–109)
Creatinine: 1.2 mg/dL — ABNORMAL HIGH (ref 0.6–1.1)
EGFR: 55 mL/min/{1.73_m2} — AB (ref >90)
Glucose: 107 mg/dl (ref 70–140)
Potassium: 2.7 mEq/L — CL (ref 3.5–5.1)
SODIUM: 140 meq/L (ref 136–145)
TOTAL PROTEIN: 7.2 g/dL (ref 6.4–8.3)

## 2015-09-21 LAB — C DIFFICILE QUICK SCREEN W PCR REFLEX
C DIFFICILE (CDIFF) INTERP: NEGATIVE
C DIFFICILE (CDIFF) TOXIN: NEGATIVE
C Diff antigen: NEGATIVE

## 2015-09-21 MED ORDER — DIPHENOXYLATE-ATROPINE 2.5-0.025 MG PO TABS: ORAL_TABLET | ORAL | Status: AC

## 2015-09-21 MED ORDER — SODIUM CHLORIDE 0.9 % IV SOLN
INTRAVENOUS | Status: AC
  Administered 2015-09-21: 12:00:00 via INTRAVENOUS
  Filled 2015-09-21: qty 1000

## 2015-09-21 MED ORDER — HEPARIN SOD (PORK) LOCK FLUSH 100 UNIT/ML IV SOLN
500.0000 [IU] | INTRAVENOUS | Status: AC | PRN
  Administered 2015-09-21: 500 [IU]
  Filled 2015-09-21: qty 5

## 2015-09-21 MED ORDER — KLOR-CON 10 10 MEQ PO TBCR: EXTENDED_RELEASE_TABLET | ORAL | Status: DC

## 2015-09-21 MED ORDER — POTASSIUM CHLORIDE CRYS ER 20 MEQ PO TBCR: 40.0000 meq | EXTENDED_RELEASE_TABLET | Freq: Once | ORAL | Status: DC

## 2015-09-21 MED ORDER — SODIUM CHLORIDE 0.9% FLUSH
10.0000 mL | INTRAVENOUS | Status: AC | PRN
  Administered 2015-09-21: 10 mL

## 2015-09-21 MED ORDER — SODIUM CHLORIDE 0.9 % IV SOLN: INTRAVENOUS | Status: DC

## 2015-09-21 MED ORDER — POTASSIUM CHLORIDE CRYS ER 20 MEQ PO TBCR
40.0000 meq | EXTENDED_RELEASE_TABLET | Freq: Once | ORAL | Status: AC
  Administered 2015-09-21: 40 meq via ORAL
  Filled 2015-09-21: qty 2

## 2015-09-21 NOTE — Progress Notes (Signed)
Diagnosis Association: Dehydration (276.51  Provider: C. Berniece Salines, NP  Procedure: Pt received 1 liter of normal saline with potassium via porta cath  Pt tolerated the infusion and porta cath flushed per protocol  Post procedure: Pt alert, oriented and ambulatory at discharge

## 2015-09-24 ENCOUNTER — Other Ambulatory Visit: Payer: Self-pay | Admitting: Internal Medicine

## 2015-09-24 ENCOUNTER — Encounter: Payer: Self-pay | Admitting: Nurse Practitioner

## 2015-09-24 DIAGNOSIS — J40 Bronchitis, not specified as acute or chronic: Secondary | ICD-10-CM | POA: Insufficient documentation

## 2015-09-24 DIAGNOSIS — E876 Hypokalemia: Secondary | ICD-10-CM | POA: Insufficient documentation

## 2015-09-24 NOTE — Assessment & Plan Note (Signed)
Patient suffers with chronic diarrhea; most likely secondary to patient's oral chemotherapy regimen.  C. difficile obtained today was negative.  Patient continues to alternate both Imodium and Lomotil as directed.  Patient was given a refill of Lomotil today.

## 2015-09-24 NOTE — Progress Notes (Signed)
SYMPTOM MANAGEMENT CLINIC   HPI: [name] 65 y.o. female diagnosed with multiple myeloma.  Currently taking pomalyst oral medication.   Patient presents to the [facility] today with complaint of occasional nausea and vomiting; and chronic diarrhea.  She also states she was diagnosed with a URI/bronchitis earlier this week; and continues to take Zithromax as directed.  She denies any recent fevers or chills.   HPI  Review of Systems  HENT: Positive for congestion. Negative for sore throat.   Respiratory: Positive for cough. Negative for hemoptysis, sputum production, shortness of breath and wheezing.   All other systems reviewed and are negative.   Past Medical History  Diagnosis Date  . Hypercholesterolemia   . Compression fracture 04/08/2007    pathologic compression fracture  . Hypothyroidism   . FHx: chemotherapy     s/p 5 cycle revlimid/low dose decadron,s/p velcade,doxil,decadron,  . Hx of radiation therapy 05/05/07-05/18/07,& 03/05/11-03/21/11-    l3&l5 in 2008, t2-t6 03/2011  . GERD (gastroesophageal reflux disease)   . Insomnia     associated with steroids  . Constipation     takes oxycontin,vicodin  . Hx of radiation therapy 05/05/2007 to 05/18/2007    palliative, L3-5  . Hx of radiation therapy 03/05/2011 to 03/21/2011    palliative T2-T6, c-spine  . 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 PONV (postoperative nausea and vomiting)   . Multiple myeloma ([place]) dx'd 2009  . Metastasis to bone ([place])   . Family history of anesthesia complication     "daughter gets PONV too"  . Pneumonia     "several times"  . Stroke [facility]) 2014    denies residual on 01/27/2014    Past Surgical History  Procedure Laterality Date  . Knee arthroscopy Right     "put pin in"  . Knee arthroscopy Right     "took pin out and corrected what was wrong"  . Video bronchoscopy  07/30/2011    Procedure: VIDEO BRONCHOSCOPY WITHOUT FLUORO;   Surgeon: Kathee Delton, MD;  Location: [facility];  Service: Cardiopulmonary;  Laterality: Bilateral;  . Cholecystectomy  1980's  . Portacath placement Right 2009  . Vaginal hysterectomy  1980's    has Multiple myeloma ([place]); Hypothyroidism; GERD; Essential hypertension; History of autologous stem cell transplant ([place]); Hyperlipidemia; Medication management; Vitamin D deficiency; CKD stage 3 due to type 2 diabetes mellitus ([place]); Pain and swelling of left lower extremity; Encounter for antineoplastic chemotherapy; Lytic bone lesions on xray; Diarrhea; Dehydration; Peripheral edema; Nausea without vomiting; Multiple myeloma not having achieved remission ([place]); Hypokalemia; and Bronchitis on her problem list.    has No Known Allergies.    Medication List       This list is accurate as of: 09/21/15 11:59 PM.  Always use your most recent med list.               ALPRAZolam 0.5 MG tablet  Commonly known as:  XANAX  TAKE 1/2-1 TABLET 2-3 TIMES A DAY AS NEEDED FOR ANXIETY     aspirin 81 MG tablet  Take 81 mg by mouth daily.     azithromycin 250 MG tablet  Commonly known as:  ZITHROMAX Z-PAK  2 po day one, then 1 daily x 4 days     cholestyramine 4 g packet  Commonly known as:  QUESTRAN  Take 1 packet by mouth 2 (two) times daily. Please take 2-3 hours after taking your regular  medications     citalopram 40 MG tablet  Commonly known as:  CELEXA  Take 1 tablet (40 mg total) by mouth daily.     cyclobenzaprine 10 MG tablet  Commonly known as:  FLEXERIL  TAKE 1/2 TO 1 TABLET BY MOUTH THREE TIMES DAILY FOR MUSCLE SPASMS     dexamethasone 4 MG tablet  Commonly known as:  DECADRON  TAKE (10) TABLETS BY MOUTH EVERY FRIDAY     DIPHENHIST 12.5 MG/5ML liquid  Generic drug:  diphenhydrAMINE     diphenoxylate-atropine 2.5-0.025 MG tablet  Commonly known as:  LOMOTIL  TAKE 2 TABLETS BY MOUTH 4 TIMES A DAY AS NEEDED FOR DIARRHEA     Eszopiclone 3 MG Tabs  Take 1 tablet (3 mg total)  by mouth at bedtime as needed. for sleep     fexofenadine 60 MG tablet  Commonly known as:  ALLEGRA  Take 1 tablet daily as needed for allergies     furosemide 40 MG tablet  Commonly known as:  LASIX  TAKE 1 TABLET BY MOUTH TWICE A DAY FOR FLUID AND SWELLING     gabapentin 600 MG tablet  Commonly known as:  NEURONTIN  TAKE 1 TABLET 4 X DAILY AS NEEDED FOR PAIN OR CRAMPS     HYDROcodone-acetaminophen 7.5-325 MG tablet  Commonly known as:  NORCO  Take 1 tablet by mouth every 6 (six) hours as needed for moderate pain.     hyoscyamine 0.125 MG SL tablet  Commonly known as:  LEVSIN SL  Take 1 tablet (0.125 mg total) by mouth every 6 (six) hours as needed.     KLOR-CON 10 10 MEQ tablet  Generic drug:  potassium chloride  Increase to 2 caps PO BID x 1 week; then return to 1 cap PO BID.     levothyroxine 100 MCG tablet  Commonly known as:  SYNTHROID, LEVOTHROID  Take 100 mcg by mouth daily before breakfast.     loperamide 2 MG capsule  Commonly known as:  IMODIUM  Take 2 mg by mouth as needed for diarrhea or loose stools.     Magnesium Hydroxide 400 MG Chew  Chew 2 tablets by mouth daily.     metolazone 2.5 MG tablet  Commonly known as:  ZAROXOLYN  Please take 1 tablet on Wednesday and Sunday in the morning for swelling.     mometasone 50 MCG/ACT nasal spray  Commonly known as:  NASONEX  Place 2 sprays into the nose daily.     montelukast 10 MG tablet  Commonly known as:  SINGULAIR  Take 1 tablet (10 mg total) by mouth daily.     morphine 15 MG 12 hr tablet  Commonly known as:  MS CONTIN  Take 1 tablet (15 mg total) by mouth every 12 (twelve) hours.     multivitamin with minerals Tabs tablet  Take 1 tablet by mouth every morning.     ondansetron 8 MG disintegrating tablet  Commonly known as:  ZOFRAN-ODT  TAKE 1 TABLET BY MOUTH EVERY 8 HOURS AS NEEDED FOR NAUSEA AND VOMITING     pantoprazole 40 MG tablet  Commonly known as:  PROTONIX  TAKE 1 TABLET (40 MG TOTAL)  BY MOUTH 2 (TWO) TIMES DAILY.     pomalidomide 3 MG capsule  Commonly known as:  POMALYST  Take 1 capsule (3 mg total) by mouth daily. Take with water on days 1-21. Repeat every 28 days.auth# [id number]   09/06/15     predniSONE 20  MG tablet  Commonly known as:  DELTASONE  3 tabs po daily x 3 days, then 2 tabs x 3 days, then 1.5 tabs x 3 days, then 1 tab x 3 days, then 0.5 tabs x 3 days     promethazine-dextromethorphan 6.25-15 MG/5ML syrup  Commonly known as:  PROMETHAZINE-DM  TAKE 5MLS BY MOUTH 4 TIMES A DAY AS NEEDED FOR COUGH     promethazine-dextromethorphan 6.25-15 MG/5ML syrup  Commonly known as:  PROMETHAZINE-DM  Take 5-10 mL PO q8hrs prn for cold symptoms         PHYSICAL EXAMINATION  Oncology Vitals 09/21/2015 09/04/2015  Height 165 cm 165 cm  Weight 91.672 kg 93.486 kg  Weight (lbs) 202 lbs 2 oz 206 lbs 2 oz  BMI (kg/m2) 33.63 kg/m2 34.3 kg/m2  Temp 98.2 98.3  Pulse 77 64  Resp 20 18  SpO2 100 99  BSA (m2) 2.05 m2 2.07 m2   BP Readings from Last 2 Encounters:  09/21/15 133/72  09/04/15 122/61    Physical Exam  Constitutional: She is oriented to person, place, and time and well-developed, well-nourished, and in no distress.  HENT:  Head: Normocephalic and atraumatic.  Mouth/Throat: Oropharynx is clear and moist.  Eyes: Conjunctivae and EOM are normal. Pupils are equal, round, and reactive to light. Right eye exhibits no discharge. Left eye exhibits no discharge. No scleral icterus.  Neck: Normal range of motion. Neck supple. No JVD present. No tracheal deviation present. No thyromegaly present.  Cardiovascular: Normal rate, regular rhythm, normal heart sounds and intact distal pulses.   Pulmonary/Chest: Effort normal and breath sounds normal. No respiratory distress. She has no wheezes. She has no rales. She exhibits no tenderness.  Abdominal: Soft. Bowel sounds are normal. She exhibits no distension and no mass. There is no tenderness. There is no rebound and no  guarding.  Musculoskeletal: Normal range of motion. She exhibits no edema or tenderness.  Lymphadenopathy:    She has no cervical adenopathy.  Neurological: She is alert and oriented to person, place, and time. Gait normal.  Skin: Skin is warm and dry. No rash noted. No erythema. No pallor.  Psychiatric: Affect normal.    LABORATORY DATA:. Hospital Outpatient Visit on 09/21/2015  Component Date Value Ref Range Status  . C Diff antigen 09/21/2015 NEGATIVE  NEGATIVE Final  . C Diff toxin 09/21/2015 NEGATIVE  NEGATIVE Final  . C Diff interpretation 09/21/2015 Negative for toxigenic C. difficile   Final  Appointment on 09/21/2015  Component Date Value Ref Range Status  . WBC 09/21/2015 3.1* 3.9 - 10.3 10e3/uL Final  . NEUT# 09/21/2015 1.8  1.5 - 6.5 10e3/uL Final  . HGB 09/21/2015 11.6  11.6 - 15.9 g/dL Final  . HCT 09/21/2015 35.2  34.8 - 46.6 % Final  . Platelets 09/21/2015 214  145 - 400 10e3/uL Final  . MCV 09/21/2015 100.3  79.5 - 101.0 fL Final  . MCH 09/21/2015 33.0  25.1 - 34.0 pg Final  . MCHC 09/21/2015 33.0  31.5 - 36.0 g/dL Final  . RBC 09/21/2015 3.51* 3.70 - 5.45 10e6/uL Final  . RDW 09/21/2015 14.8* 11.2 - 14.5 % Final  . lymph# 09/21/2015 1.0  0.9 - 3.3 10e3/uL Final  . MONO# 09/21/2015 0.3  0.1 - 0.9 10e3/uL Final  . Eosinophils Absolute 09/21/2015 0.1  0.0 - 0.5 10e3/uL Final  . Basophils Absolute 09/21/2015 0.0  0.0 - 0.1 10e3/uL Final  . NEUT% 09/21/2015 56.2  38.4 - 76.8 % Final  .  LYMPH% 09/21/2015 31.2  14.0 - 49.7 % Final  . MONO% 09/21/2015 8.7  0.0 - 14.0 % Final  . EOS% 09/21/2015 3.1  0.0 - 7.0 % Final  . BASO% 09/21/2015 0.8  0.0 - 2.0 % Final  . Sodium 09/21/2015 140  136 - 145 mEq/L Final  . Potassium 09/21/2015 2.7* 3.5 - 5.1 mEq/L Final  . Chloride 09/21/2015 95* 98 - 109 mEq/L Final  . CO2 09/21/2015 35* 22 - 29 mEq/L Final  . Glucose 09/21/2015 107  70 - 140 mg/dl Final   Glucose reference range is for nonfasting patients. Fasting glucose  reference range is 70- 100.  Marland Kitchen BUN 09/21/2015 15.1  7.0 - 26.0 mg/dL Final  . Creatinine 09/21/2015 1.2* 0.6 - 1.1 mg/dL Final  . Total Bilirubin 09/21/2015 0.84  0.20 - 1.20 mg/dL Final  . Alkaline Phosphatase 09/21/2015 78  40 - 150 U/L Final  . AST 09/21/2015 17  5 - 34 U/L Final  . ALT 09/21/2015 17  0 - 55 U/L Final  . Total Protein 09/21/2015 7.2  6.4 - 8.3 g/dL Final  . Albumin 09/21/2015 3.6  3.5 - 5.0 g/dL Final  . Calcium 09/21/2015 9.5  8.4 - 10.4 mg/dL Final  . Anion Gap 09/21/2015 10  3 - 11 mEq/L Final  . EGFR 09/21/2015 55* >90 ml/min/1.73 m2 Final   eGFR is calculated using the CKD-EPI Creatinine Equation (2009)     RADIOGRAPHIC STUDIES: Dg Chest 2 View  09/21/2015  CLINICAL DATA:  History of multiple myeloma, upper respiratory symptoms EXAM: CHEST  2 VIEW COMPARISON:  05/05/2015 FINDINGS: Cardiomediastinal silhouette is stable. No acute infiltrate or pleural effusion. No pulmonary edema. Right IJ Port-A-Cath with tip in SVC right atrium junction. Mild degenerative changes mid thoracic spine. IMPRESSION: No active cardiopulmonary disease. Electronically Signed   By: Lahoma Crocker M.D.   On: 09/21/2015 08:59    ASSESSMENT/PLAN:    Multiple myeloma [facility]) Patient continues to take Pomolyst oral therapy as directed.  Blood counts obtained today were essentially normal.  See further notes for details of today's visit.  Patient is scheduled to return on 10/02/2015 for labs and follow up visit.  Diarrhea Patient suffers with chronic diarrhea; most likely secondary to patient's oral chemotherapy regimen.  C. difficile obtained today was negative.  Patient continues to alternate both Imodium and Lomotil as directed.  Patient was given a refill of Lomotil today.  Dehydration Patient has been suffering with some chronic diarrhea; as well as some mild nausea and intermittent vomiting.  She feels mildly dehydrated today.  Patient requested and was given IV fluid rehydration today.   2000 encouraged to push fluids at home with much as possible.  Peripheral edema Patient has chronic bilateral lower extremity; with the left greater than right.  Doppler ultrasound obtained on February 27 was negative for DVT.  Patient was encouraged to keep her feet elevated above the level of for heart whenever possible.  Nausea without vomiting Patient has been suffering with some chronic nausea and intermittent vomiting.  Patient appears dehydrated today; and will receive IV fluid rehydration.  Confirmed the patient already has nausea medications to take home as directed.  Hypokalemia Potassium down to 2.7 today.  Patient states that she has been taken potassium 10 mEq twice per day as directed.  Patient will receive 20 mEq, potassium in her IV fluids today; and was also given an additional 40 mEq orally while at the cancer center.  Also, patient was advised  to increase her potassium to 20 mEq twice a day for one week; and then to return to taking 20 mEq per day.  Will continue to monitor closely.  Bronchitis Patient reports URI symptoms and a chronic cough for the past week or so.  Chest x-ray obtained today is negative for any acute findings.  Patient states that she was given a Z-Pak earlier this week; this taken as directed.  Exam today reveals bilateral breath sounds clear with no wheezing or cough.  Vital signs were stable and patient was afebrile.  Patient was advised to continue taking the Z-Pak as directed.  Patient stated understanding of all instructions; and was in agreement with this plan of care. The patient knows to call the clinic with any problems, questions or concerns.   Review/collaboration with Dr. Julien Nordmann regarding all aspects of patient's visit today.   Total time spent with patient was 25 minutes;  with greater than 75 percent of that time spent in face to face counseling regarding patient's symptoms,  and coordination of care and follow up.  Disclaimer:This  dictation was prepared with Dragon/digital dictation along with [employer]. Any transcriptional errors that result from this process are unintentional.  Drue Second, NP 09/24/2015

## 2015-09-24 NOTE — Assessment & Plan Note (Signed)
Potassium down to 2.7 today.  Patient states that she has been taken potassium 10 mEq twice per day as directed.  Patient will receive 20 mEq, potassium in her IV fluids today; and was also given an additional 40 mEq orally while at the cancer center.  Also, patient was advised to increase her potassium to 20 mEq twice a day for one week; and then to return to taking 20 mEq per day.  Will continue to monitor closely.

## 2015-09-24 NOTE — Assessment & Plan Note (Signed)
Patient reports URI symptoms and a chronic cough for the past week or so.  Chest x-ray obtained today is negative for any acute findings.  Patient states that she was given a Z-Pak earlier this week; this taken as directed.  Exam today reveals bilateral breath sounds clear with no wheezing or cough.  Vital signs were stable and patient was afebrile.  Patient was advised to continue taking the Z-Pak as directed.

## 2015-09-24 NOTE — Assessment & Plan Note (Signed)
Patient has been suffering with some chronic nausea and intermittent vomiting.  Patient appears dehydrated today; and will receive IV fluid rehydration.  Confirmed the patient already has nausea medications to take home as directed.

## 2015-09-24 NOTE — Assessment & Plan Note (Signed)
Patient continues to take Pomolyst oral therapy as directed.  Blood counts obtained today were essentially normal.  See further notes for details of today's visit.  Patient is scheduled to return on 10/02/2015 for labs and follow up visit.

## 2015-09-24 NOTE — Assessment & Plan Note (Signed)
Patient has been suffering with some chronic diarrhea; as well as some mild nausea and intermittent vomiting.  She feels mildly dehydrated today.  Patient requested and was given IV fluid rehydration today.  2000 encouraged to push fluids at home with much as possible.

## 2015-09-24 NOTE — Assessment & Plan Note (Signed)
Patient has chronic bilateral lower extremity; with the left greater than right.  Doppler ultrasound obtained on February 27 was negative for DVT.  Patient was encouraged to keep her feet elevated above the level of for heart whenever possible.

## 2015-09-25 ENCOUNTER — Telehealth: Payer: Self-pay | Admitting: *Deleted

## 2015-09-25 NOTE — Telephone Encounter (Signed)
LM for rtn call to advised C-diff was negative.

## 2015-09-25 NOTE — Telephone Encounter (Signed)
RX CALLED INTO [facility]. 

## 2015-10-02 ENCOUNTER — Ambulatory Visit (HOSPITAL_BASED_OUTPATIENT_CLINIC_OR_DEPARTMENT_OTHER): Payer: Medicare Other | Admitting: Internal Medicine

## 2015-10-02 ENCOUNTER — Encounter: Payer: Self-pay | Admitting: Internal Medicine

## 2015-10-02 ENCOUNTER — Telehealth: Payer: Self-pay | Admitting: Internal Medicine

## 2015-10-02 ENCOUNTER — Other Ambulatory Visit (HOSPITAL_BASED_OUTPATIENT_CLINIC_OR_DEPARTMENT_OTHER): Payer: Medicare Other

## 2015-10-02 VITALS — BP 131/60 | HR 75 | Temp 98.0°F | Resp 20 | Ht 65.0 in | Wt 209.5 lb

## 2015-10-02 DIAGNOSIS — C9000 Multiple myeloma not having achieved remission: Secondary | ICD-10-CM

## 2015-10-02 DIAGNOSIS — Z5111 Encounter for antineoplastic chemotherapy: Secondary | ICD-10-CM

## 2015-10-02 DIAGNOSIS — G629 Polyneuropathy, unspecified: Secondary | ICD-10-CM | POA: Diagnosis not present

## 2015-10-02 LAB — CBC WITH DIFFERENTIAL/PLATELET
BASO%: 0.1 % (ref 0.0–2.0)
Basophils Absolute: 0 10*3/uL (ref 0.0–0.1)
EOS ABS: 0.2 10*3/uL (ref 0.0–0.5)
EOS%: 4.7 % (ref 0.0–7.0)
HCT: 35.7 % (ref 34.8–46.6)
HGB: 11.4 g/dL — ABNORMAL LOW (ref 11.6–15.9)
LYMPH%: 32.5 % (ref 14.0–49.7)
MCH: 33.1 pg (ref 25.1–34.0)
MCHC: 32 g/dL (ref 31.5–36.0)
MCV: 103.4 fL — AB (ref 79.5–101.0)
MONO#: 0.5 10*3/uL (ref 0.1–0.9)
MONO%: 14.8 % — ABNORMAL HIGH (ref 0.0–14.0)
NEUT#: 1.5 10*3/uL (ref 1.5–6.5)
NEUT%: 47.9 % (ref 38.4–76.8)
Platelets: 147 10*3/uL (ref 145–400)
RBC: 3.45 10*6/uL — ABNORMAL LOW (ref 3.70–5.45)
RDW: 15.8 % — ABNORMAL HIGH (ref 11.2–14.5)
WBC: 3.2 10*3/uL — ABNORMAL LOW (ref 3.9–10.3)
lymph#: 1.1 10*3/uL (ref 0.9–3.3)

## 2015-10-02 LAB — LACTATE DEHYDROGENASE: LDH: 126 U/L (ref 125–245)

## 2015-10-02 LAB — COMPREHENSIVE METABOLIC PANEL
ALBUMIN: 3.1 g/dL — AB (ref 3.5–5.0)
ALK PHOS: 65 U/L (ref 40–150)
ALT: 28 U/L (ref 0–55)
ANION GAP: 6 meq/L (ref 3–11)
AST: 14 U/L (ref 5–34)
BUN: 13.8 mg/dL (ref 7.0–26.0)
CALCIUM: 10 mg/dL (ref 8.4–10.4)
CO2: 29 mEq/L (ref 22–29)
Chloride: 103 mEq/L (ref 98–109)
Creatinine: 0.9 mg/dL (ref 0.6–1.1)
EGFR: 75 mL/min/{1.73_m2} — AB (ref >90)
Glucose: 103 mg/dl (ref 70–140)
POTASSIUM: 4.1 meq/L (ref 3.5–5.1)
SODIUM: 138 meq/L (ref 136–145)
Total Bilirubin: 0.85 mg/dL (ref 0.20–1.20)
Total Protein: 6.4 g/dL (ref 6.4–8.3)

## 2015-10-02 MED ORDER — HYDROCODONE-ACETAMINOPHEN 7.5-325 MG PO TABS: 1.0000 | ORAL_TABLET | Freq: Four times a day (QID) | ORAL | Status: DC | PRN

## 2015-10-02 MED ORDER — MORPHINE SULFATE ER 15 MG PO TBCR: 15.0000 mg | EXTENDED_RELEASE_TABLET | Freq: Two times a day (BID) | ORAL | Status: DC

## 2015-10-02 NOTE — Telephone Encounter (Signed)
Gave and printed appt sched and avs for pt for may °

## 2015-10-02 NOTE — Progress Notes (Signed)
[place] [phone]   Fax:([phone]) [place], [place] [address]  DIAGNOSIS: Recurrent multiple myeloma initially diagnosed in October 2008.   PRIOR THERAPY:  1. Status post palliative radiotherapy to the lumbar spine between L3 and L5. The patient received a total dose of 3000 cGy in 10 fractions under the care of Dr. Lisbeth Renshaw between May 05, 2007 through May 18, 2007. 2. Status post 5 cycles of systemic chemotherapy with Revlimid and low-dose Decadron with good response to this treatment. 3. Status post autologous peripheral blood stem cell transplant at [facility] on Nov 20, 2007 under the care of Dr. Valarie Merino. 4. Status post treatment for disease recurrence with Velcade, Doxil and Decadron. Last dose given Nov 09, 2009. Discontinued secondary to intolerance but the patient had a good response to treatment at that time. 5. Status post palliative radiotherapy to the T2-T6 thoracic vertebrae completed 03/21/2011 under the care of Dr. Lisbeth Renshaw. 6. Systemic chemotherapy with Velcade 1.3 mg per meter squared given on days 1, 4, 8 and 11, and Doxil at 30 mg per meter squared given on day 4 in addition to Decadron status post 4 cycles, discontinued secondary to intolerance. 7. Systemic therapy with Velcade 1.3 mg/M2 subcutaneously in addition to Decadron 40 mg by mouth on a weekly basis, status post 20 cycles. The patient had good response with this treatment but it is discontinue today secondary to worsening peripheral neuropathy. 8. Palliative radiotherapy to the skull lesion as well as the left hip area under the care of Dr. Lisbeth Renshaw. 9. Systemic chemotherapy with Carfilzomib 20 mg/M2 on days 1, 2,  8, 9, 13 and 16 every 4 weeks in addition to weekly Decadron 40 mg by mouth. First dose on 04/19/2013. Status post 4 cycles, discontinued recently secondary to cardiac  dysfunction.  CURRENT THERAPY:  1. Pomalyst 3 mg by mouth daily for 21 days every 4 weeks in addition to dexamethasone 40 mg on a weekly basis. First dose started 12/02/2013. Status post 22 cycles. She will start cycle #23 on 10/10/2015. Her dose of Pomalyst will be reduced to 2 mg by mouth daily for 21 days every 4 weeks starting from the next cycle. 2. Zometa 4 mg IV given every 3 months.   INTERVAL HISTORY: [name] 65 y.o. female returns to the clinic today for follow up visit accompanied by her daughter. The patient is feeling fine today with no specific complaints. Unfortunately she lost her mother last week. She is tolerating her current treatment with Pomalyst 3 MG by mouth daily for 21 days every 4 weeks fairly well with no significant adverse effects except for a few episodes of diarrhea. She was treated with Imodium and Lomotil. She did not have any diarrhea over the last several days. The patient denied having any significant chest pain, shortness of breath, or hemoptysis. She has no nausea or vomiting. She has no weight loss or night sweats. She continues to have mild peripheral neuropathy and currently on gabapentin. She had repeat CBC and comprehensive metabolic panel earlier today and she is here for evaluation and discussion of her lab results.  MEDICAL HISTORY: Past Medical History  Diagnosis Date  . Hypercholesterolemia   . Compression fracture 04/08/2007    pathologic compression fracture  . Hypothyroidism   . FHx: chemotherapy     s/p 5 cycle revlimid/low dose decadron,s/p velcade,doxil,decadron,  . Hx of radiation  therapy 05/05/07-05/18/07,& 03/05/11-03/21/11-    l3&l5 in 2008, t2-t6 03/2011  . GERD (gastroesophageal reflux disease)   . Insomnia     associated with steroids  . Constipation     takes oxycontin,vicodin  . Hx of radiation therapy 05/05/2007 to 05/18/2007    palliative, L3-5  . Hx of radiation therapy 03/05/2011 to 03/21/2011    palliative T2-T6,  c-spine  . History of autologous stem cell transplant ([place]) 11/20/2007    [facility], Dr Valarie Merino  . PONV (postoperative nausea and vomiting)   . Multiple myeloma ([place]) dx'd 2009  . Metastasis to bone ([place])   . Family history of anesthesia complication     "daughter gets PONV too"  . Pneumonia     "several times"  . Stroke [facility]) 2014    denies residual on 01/27/2014    ALLERGIES:  has No Known Allergies.  MEDICATIONS:  Current Outpatient Prescriptions  Medication Sig Dispense Refill  . ALPRAZolam (XANAX) 0.5 MG tablet TAKE 1/2 TO 1 TABLET 2 TO 3 TIMES DAILY AS NEEDED FOR ANXIETY 90 tablet 0  . aspirin 81 MG tablet Take 81 mg by mouth daily.    . cholestyramine (QUESTRAN) 4 g packet Take 1 packet by mouth 2 (two) times daily. Please take 2-3 hours after taking your regular medications 60 each 11  . citalopram (CELEXA) 40 MG tablet Take 1 tablet (40 mg total) by mouth daily. (Patient taking differently: Take 20 mg by mouth daily. ) 90 tablet 2  . cyclobenzaprine (FLEXERIL) 10 MG tablet TAKE 1/2 TO 1 TABLET BY MOUTH THREE TIMES DAILY FOR MUSCLE SPASMS 90 tablet 2  . dexamethasone (DECADRON) 4 MG tablet TAKE (10) TABLETS BY MOUTH EVERY FRIDAY 40 tablet 1  . DIPHENHIST 12.5 MG/5ML liquid     . diphenoxylate-atropine (LOMOTIL) 2.5-0.025 MG tablet TAKE 2 TABLETS BY MOUTH 4 TIMES A DAY AS NEEDED FOR DIARRHEA 30 tablet 1  . erythromycin ophthalmic ointment     . Eszopiclone 3 MG TABS Take 1 tablet (3 mg total) by mouth at bedtime as needed. for sleep 30 tablet 2  . furosemide (LASIX) 40 MG tablet TAKE 1 TABLET BY MOUTH TWICE A DAY FOR FLUID AND SWELLING 60 tablet 3  . gabapentin (NEURONTIN) 600 MG tablet TAKE 1 TABLET 4 X DAILY AS NEEDED FOR PAIN OR CRAMPS 120 tablet 4  . HYDROcodone-acetaminophen (NORCO) 7.5-325 MG tablet Take 1 tablet by mouth every 6 (six) hours as needed for moderate pain. 30 tablet 0  . hyoscyamine (LEVSIN SL) 0.125 MG SL tablet Take 1 tablet (0.125 mg total) by mouth every 6 (six)  hours as needed. 30 tablet 0  . KLOR-CON 10 10 MEQ tablet Increase to 2 caps PO BID x 1 week; then return to 1 cap PO BID. 60 tablet 3  . levothyroxine (SYNTHROID, LEVOTHROID) 100 MCG tablet Take 100 mcg by mouth daily before breakfast.     . Magnesium Hydroxide 400 MG CHEW Chew 2 tablets by mouth daily.     . metolazone (ZAROXOLYN) 2.5 MG tablet Please take 1 tablet on Wednesday and Sunday in the morning for swelling. 30 tablet 1  . mometasone (NASONEX) 50 MCG/ACT nasal spray Place 2 sprays into the nose daily. 17 g 2  . montelukast (SINGULAIR) 10 MG tablet Take 1 tablet (10 mg total) by mouth daily. 30 tablet 2  . morphine (MS CONTIN) 15 MG 12 hr tablet Take 1 tablet (15 mg total) by mouth every 12 (twelve) hours. 60 tablet 0  .  Multiple Vitamin (MULITIVITAMIN WITH MINERALS) TABS Take 1 tablet by mouth every morning.     . ondansetron (ZOFRAN-ODT) 8 MG disintegrating tablet TAKE 1 TABLET BY MOUTH EVERY 8 HOURS AS NEEDED FOR NAUSEA AND VOMITING 30 tablet 1  . pantoprazole (PROTONIX) 40 MG tablet TAKE 1 TABLET (40 MG TOTAL) BY MOUTH 2 (TWO) TIMES DAILY. 60 tablet 5  . pomalidomide (POMALYST) 3 MG capsule Take 1 capsule (3 mg total) by mouth daily. Take with water on days 1-21. Repeat every 28 days.auth# [id number]   09/06/15 21 capsule 0  . predniSONE (DELTASONE) 20 MG tablet 3 tabs po daily x 3 days, then 2 tabs x 3 days, then 1.5 tabs x 3 days, then 1 tab x 3 days, then 0.5 tabs x 3 days 27 tablet 0  . promethazine-dextromethorphan (PROMETHAZINE-DM) 6.25-15 MG/5ML syrup TAKE 5MLS BY MOUTH 4 TIMES A DAY AS NEEDED FOR COUGH 180 mL 0  . promethazine-dextromethorphan (PROMETHAZINE-DM) 6.25-15 MG/5ML syrup Take 5-10 mL PO q8hrs prn for cold symptoms 360 mL 1  . fexofenadine (ALLEGRA) 60 MG tablet Take 1 tablet daily as needed for allergies 90 tablet 1  . loperamide (IMODIUM) 2 MG capsule Take 2 mg by mouth as needed for diarrhea or loose stools. Reported on 10/02/2015     No current facility-administered  medications for this visit.    SURGICAL HISTORY:  Past Surgical History  Procedure Laterality Date  . Knee arthroscopy Right     "put pin in"  . Knee arthroscopy Right     "took pin out and corrected what was wrong"  . Video bronchoscopy  07/30/2011    Procedure: VIDEO BRONCHOSCOPY WITHOUT FLUORO;  Surgeon: Kathee Delton, MD;  Location: [facility];  Service: Cardiopulmonary;  Laterality: Bilateral;  . Cholecystectomy  1980's  . Portacath placement Right 2009  . Vaginal hysterectomy  1980's    REVIEW OF SYSTEMS:  A comprehensive review of systems was negative except for: Constitutional: positive for fatigue   PHYSICAL EXAMINATION: General appearance: alert, cooperative, fatigued and no distress Head: Normocephalic, without obvious abnormality, atraumatic Neck: no adenopathy, no JVD, supple, symmetrical, trachea midline and thyroid not enlarged, symmetric, no tenderness/mass/nodules Lymph nodes: Cervical, supraclavicular, and axillary nodes normal. Resp: clear to auscultation bilaterally Back: symmetric, no curvature. ROM normal. No CVA tenderness. Cardio: regular rate and rhythm, S1, S2 normal, no murmur, click, rub or gallop GI: soft, non-tender; bowel sounds normal; no masses,  no organomegaly Extremities: edema 1+ edema of left lower extremity. Neurologic: Alert and oriented X 3, normal strength and tone. Normal symmetric reflexes. Normal coordination and gait  ECOG PERFORMANCE STATUS: 1 - Symptomatic but completely ambulatory  Blood pressure 131/60, pulse 75, temperature 98 F (36.7 C), temperature source Oral, resp. rate 20, height 5' 5"  (1.651 m), weight 209 lb 8 oz (95.029 kg), SpO2 100 %.  LABORATORY DATA: Lab Results  Component Value Date   WBC 3.2* 10/02/2015   HGB 11.4* 10/02/2015   HCT 35.7 10/02/2015   MCV 103.4* 10/02/2015   PLT 147 10/02/2015      Chemistry      Component Value Date/Time   NA 140 09/21/2015 0915   NA 137 05/05/2015 1400   K 2.7*  09/21/2015 0915   K 3.6 05/05/2015 1400   CL 104 05/05/2015 1400   CL 105 12/17/2012 1015   CO2 35* 09/21/2015 0915   CO2 27 05/05/2015 1400   BUN 15.1 09/21/2015 0915   BUN 11 05/05/2015 1400   CREATININE 1.2*  09/21/2015 0915   CREATININE 0.82 05/05/2015 1400   CREATININE 0.68 03/14/2015 1111      Component Value Date/Time   CALCIUM 9.5 09/21/2015 0915   CALCIUM 8.5* 05/05/2015 1400   ALKPHOS 78 09/21/2015 0915   ALKPHOS 74 03/14/2015 1111   AST 17 09/21/2015 0915   AST 10 03/14/2015 1111   ALT 17 09/21/2015 0915   ALT 11 03/14/2015 1111   BILITOT 0.84 09/21/2015 0915   BILITOT 1.0 03/14/2015 1111     Myeloma panel: Beta-2 microglobulin 2.2, free Light chain 21.43, free lambda light chain 96.96, kappa/lambda ratio 0.22. IgG 943, IgG 119 and IgM 14.  RADIOGRAPHIC STUDIES: Dg Chest 2 View  09/21/2015  CLINICAL DATA:  History of multiple myeloma, upper respiratory symptoms EXAM: CHEST  2 VIEW COMPARISON:  05/05/2015 FINDINGS: Cardiomediastinal silhouette is stable. No acute infiltrate or pleural effusion. No pulmonary edema. Right IJ Port-A-Cath with tip in SVC right atrium junction. Mild degenerative changes mid thoracic spine. IMPRESSION: No active cardiopulmonary disease. Electronically Signed   By: Lahoma Crocker M.D.   On: 09/21/2015 08:59   ASSESSMENT AND PLAN:  This is a very pleasant 65 years old [place] American female with recurrent multiple myeloma recently completed a course of treatment with Velcade and Decadron with improvement in her disease but this was discontinued secondary to peripheral neuropathy. The patient tolerated her treatment with Carfilzomib and Decadron fairly well except for the recent shortness breath and cough which was felt to be secondary to congestive heart failure from her treatment with Carfilzomib. This treatment was discontinued. She was started on treatment with Pomalyst and Decadron status post 22 cycles. She tolerated the last cycle of her treatment  well except for mild diarrhea treated with Imodium, Lomotil and Questran.  Her recent myeloma panel showed no significant evidence for disease progression. She was seen recently by Dr. Evelene Croon at [facility] and she recommended reducing her dose of Pomalyst to 2 mg for 21 days every 4 weeks. She will start the next cycle of her treatment with reduced dose. I would see the patient back for follow-up visit in one month for reevaluation with repeat CBC, comprehensive metabolic panel and LDH. For the myeloma panel bone lesions, the patient will continue her current treatment with Zometa every 3 months as scheduled.  She was advised to call immediately if she has any concerning symptoms in the interval. The patient voices understanding of current disease status and treatment options and is in agreement with the current care plan.  All questions were answered. The patient knows to call the clinic with any problems, questions or concerns. We can certainly see the patient much sooner if necessary.  Disclaimer: This note was dictated with voice recognition software. Similar sounding words can inadvertently be transcribed and may be missed upon review.

## 2015-10-03 ENCOUNTER — Ambulatory Visit (INDEPENDENT_AMBULATORY_CARE_PROVIDER_SITE_OTHER): Payer: Medicare Other | Admitting: Internal Medicine

## 2015-10-03 ENCOUNTER — Encounter: Payer: Self-pay | Admitting: Internal Medicine

## 2015-10-03 VITALS — BP 126/72 | HR 80 | Temp 97.8°F | Resp 18 | Ht 65.0 in | Wt 207.0 lb

## 2015-10-03 DIAGNOSIS — I10 Essential (primary) hypertension: Secondary | ICD-10-CM

## 2015-10-03 DIAGNOSIS — E559 Vitamin D deficiency, unspecified: Secondary | ICD-10-CM

## 2015-10-03 DIAGNOSIS — M542 Cervicalgia: Secondary | ICD-10-CM

## 2015-10-03 DIAGNOSIS — R7303 Prediabetes: Secondary | ICD-10-CM

## 2015-10-03 DIAGNOSIS — E039 Hypothyroidism, unspecified: Secondary | ICD-10-CM

## 2015-10-03 DIAGNOSIS — J309 Allergic rhinitis, unspecified: Secondary | ICD-10-CM

## 2015-10-03 DIAGNOSIS — Z79899 Other long term (current) drug therapy: Secondary | ICD-10-CM | POA: Diagnosis not present

## 2015-10-03 DIAGNOSIS — E782 Mixed hyperlipidemia: Secondary | ICD-10-CM | POA: Diagnosis not present

## 2015-10-03 DIAGNOSIS — R7309 Other abnormal glucose: Secondary | ICD-10-CM | POA: Diagnosis not present

## 2015-10-03 DIAGNOSIS — R197 Diarrhea, unspecified: Secondary | ICD-10-CM | POA: Diagnosis not present

## 2015-10-03 LAB — HEPATIC FUNCTION PANEL
ALT: 27 U/L (ref 6–29)
AST: 12 U/L (ref 10–35)
Albumin: 3.6 g/dL (ref 3.6–5.1)
Alkaline Phosphatase: 56 U/L (ref 33–130)
BILIRUBIN DIRECT: 0.2 mg/dL (ref <=0.2)
BILIRUBIN TOTAL: 0.8 mg/dL (ref 0.2–1.2)
Indirect Bilirubin: 0.6 mg/dL (ref 0.2–1.2)
Total Protein: 6.2 g/dL (ref 6.1–8.1)

## 2015-10-03 LAB — LIPID PANEL
CHOL/HDL RATIO: 2.3 ratio (ref <=5.0)
CHOLESTEROL: 178 mg/dL (ref 125–200)
HDL: 79 mg/dL (ref >=46)
LDL Cholesterol: 63 mg/dL (ref <130)
TRIGLYCERIDES: 178 mg/dL — AB (ref <150)
VLDL: 36 mg/dL — AB (ref <30)

## 2015-10-03 LAB — BASIC METABOLIC PANEL WITH GFR
BUN: 20 mg/dL (ref 7–25)
CALCIUM: 9.2 mg/dL (ref 8.6–10.4)
CO2: 29 mmol/L (ref 20–31)
CREATININE: 0.89 mg/dL (ref 0.50–0.99)
Chloride: 99 mmol/L (ref 98–110)
GFR, EST AFRICAN AMERICAN: 79 mL/min (ref >=60)
GFR, EST NON AFRICAN AMERICAN: 69 mL/min (ref >=60)
Glucose, Bld: 108 mg/dL — ABNORMAL HIGH (ref 65–99)
Potassium: 4.2 mmol/L (ref 3.5–5.3)
Sodium: 134 mmol/L — ABNORMAL LOW (ref 135–146)

## 2015-10-03 LAB — CBC WITH DIFFERENTIAL/PLATELET
Basophils Absolute: 0 cells/uL (ref 0–200)
Basophils Relative: 0 %
Eosinophils Absolute: 0 cells/uL — ABNORMAL LOW (ref 15–500)
Eosinophils Relative: 0 %
HEMATOCRIT: 35.5 % (ref 35.0–45.0)
Hemoglobin: 11.3 g/dL — ABNORMAL LOW (ref 11.7–15.5)
LYMPHS PCT: 25 %
Lymphs Abs: 1025 cells/uL (ref 850–3900)
MCH: 32.4 pg (ref 27.0–33.0)
MCHC: 31.8 g/dL — AB (ref 32.0–36.0)
MCV: 101.7 fL — ABNORMAL HIGH (ref 80.0–100.0)
MONO ABS: 738 {cells}/uL (ref 200–950)
MPV: 10.4 fL (ref 7.5–12.5)
Monocytes Relative: 18 %
NEUTROS PCT: 57 %
Neutro Abs: 2337 cells/uL (ref 1500–7800)
Platelets: 158 10*3/uL (ref 140–400)
RBC: 3.49 MIL/uL — AB (ref 3.80–5.10)
RDW: 16.1 % — AB (ref 11.0–15.0)
WBC: 4.1 10*3/uL (ref 3.8–10.8)

## 2015-10-03 LAB — TSH: TSH: 0.14 m[IU]/L — AB

## 2015-10-03 LAB — HEMOGLOBIN A1C
Hgb A1c MFr Bld: 6 % — ABNORMAL HIGH (ref <5.7)
Mean Plasma Glucose: 126 mg/dL

## 2015-10-03 MED ORDER — HYOSCYAMINE SULFATE 0.125 MG SL SUBL: 0.1250 mg | SUBLINGUAL_TABLET | Freq: Four times a day (QID) | SUBLINGUAL | Status: DC | PRN

## 2015-10-03 MED ORDER — MOMETASONE FUROATE 50 MCG/ACT NA SUSP: 2.0000 | Freq: Every day | NASAL | Status: DC

## 2015-10-03 NOTE — Progress Notes (Signed)
Assessment and Plan:  Hypertension:  -Continue medication,  -monitor blood pressure at home.  -Continue DASH diet.   -Reminder to go to the ER if any CP, SOB, nausea, dizziness, severe HA, changes vision/speech, left arm numbness and tingling, and jaw pain.  Cholesterol: -Continue diet and exercise.  -Check cholesterol.   Pre-diabetes: -Continue diet and exercise.  -Check A1C  Vitamin D Def: -check level -continue medications.   Neck pain with history of MM with active mets in brain -no imaging done recently of cervical spine -MRI with and without contrast of the cervical spine  Diarrhea -fecal fat -if positive will need creon  Continue diet and meds as discussed. Further disposition pending results of labs.  HPI 65 y.o. female  presents for 3 month follow up with hypertension, hyperlipidemia, prediabetes and vitamin D.   Her blood pressure has been controlled at home, today their BP is BP: 126/72 mmHg.   She does workout. She denies chest pain, shortness of breath, dizziness.   She is on cholesterol medication and denies myalgias. Her cholesterol is at goal. The cholesterol last visit was:   Lab Results  Component Value Date   CHOL 154 03/14/2015   HDL 43* 03/14/2015   LDLCALC 62 03/14/2015   TRIG 244* 03/14/2015   CHOLHDL 3.6 03/14/2015     She has been working on diet and exercise for prediabetes, and denies foot ulcerations, hyperglycemia, hypoglycemia , increased appetite, nausea, paresthesia of the feet, polydipsia, polyuria, visual disturbances, vomiting and weight loss. Last A1C in the office was:  Lab Results  Component Value Date   HGBA1C 6.0* 03/14/2015    Patient is on Vitamin D supplement.  Lab Results  Component Value Date   VD25OH 70 03/14/2015     She reports that she has been having some issue with left sided neck pain.  She reports that the pain in her neck has been getting worse and she has had pain with flexion and extension of her neck.  She  reports that the pain medications that she is on sometimes help, but it comes back in a little while.   She also reports that she has continued to have diarrhea.  She has not been back to see Dr. Ardis Hughs.  She is not interested in another appointment because she feels that he hasn't helped.  She did suffer a bout of dehydration.  She never brought her stool samples back to the Oncology NP.  She does not have C. Diff per hospital PCR.    Current Medications:  Current Outpatient Prescriptions on File Prior to Visit  Medication Sig Dispense Refill  . ALPRAZolam (XANAX) 0.5 MG tablet TAKE 1/2 TO 1 TABLET 2 TO 3 TIMES DAILY AS NEEDED FOR ANXIETY 90 tablet 0  . aspirin 81 MG tablet Take 81 mg by mouth daily.    . cholestyramine (QUESTRAN) 4 g packet Take 1 packet by mouth 2 (two) times daily. Please take 2-3 hours after taking your regular medications 60 each 11  . citalopram (CELEXA) 40 MG tablet Take 1 tablet (40 mg total) by mouth daily. (Patient taking differently: Take 20 mg by mouth daily. ) 90 tablet 2  . cyclobenzaprine (FLEXERIL) 10 MG tablet TAKE 1/2 TO 1 TABLET BY MOUTH THREE TIMES DAILY FOR MUSCLE SPASMS 90 tablet 2  . dexamethasone (DECADRON) 4 MG tablet TAKE (10) TABLETS BY MOUTH EVERY FRIDAY 40 tablet 1  . DIPHENHIST 12.5 MG/5ML liquid     . diphenoxylate-atropine (LOMOTIL) 2.5-0.025 MG  tablet TAKE 2 TABLETS BY MOUTH 4 TIMES A DAY AS NEEDED FOR DIARRHEA 30 tablet 1  . erythromycin ophthalmic ointment     . Eszopiclone 3 MG TABS Take 1 tablet (3 mg total) by mouth at bedtime as needed. for sleep 30 tablet 2  . furosemide (LASIX) 40 MG tablet TAKE 1 TABLET BY MOUTH TWICE A DAY FOR FLUID AND SWELLING 60 tablet 3  . gabapentin (NEURONTIN) 600 MG tablet TAKE 1 TABLET 4 X DAILY AS NEEDED FOR PAIN OR CRAMPS 120 tablet 4  . HYDROcodone-acetaminophen (NORCO) 7.5-325 MG tablet Take 1 tablet by mouth every 6 (six) hours as needed for moderate pain. 30 tablet 0  . hyoscyamine (LEVSIN SL) 0.125 MG SL  tablet Take 1 tablet (0.125 mg total) by mouth every 6 (six) hours as needed. 30 tablet 0  . KLOR-CON 10 10 MEQ tablet Increase to 2 caps PO BID x 1 week; then return to 1 cap PO BID. 60 tablet 3  . levothyroxine (SYNTHROID, LEVOTHROID) 100 MCG tablet Take 100 mcg by mouth daily before breakfast.     . loperamide (IMODIUM) 2 MG capsule Take 2 mg by mouth as needed for diarrhea or loose stools. Reported on 10/02/2015    . Magnesium Hydroxide 400 MG CHEW Chew 2 tablets by mouth daily.     . metolazone (ZAROXOLYN) 2.5 MG tablet Please take 1 tablet on Wednesday and Sunday in the morning for swelling. 30 tablet 1  . mometasone (NASONEX) 50 MCG/ACT nasal spray Place 2 sprays into the nose daily. 17 g 2  . montelukast (SINGULAIR) 10 MG tablet Take 1 tablet (10 mg total) by mouth daily. 30 tablet 2  . morphine (MS CONTIN) 15 MG 12 hr tablet Take 1 tablet (15 mg total) by mouth every 12 (twelve) hours. 60 tablet 0  . Multiple Vitamin (MULITIVITAMIN WITH MINERALS) TABS Take 1 tablet by mouth every morning.     . ondansetron (ZOFRAN-ODT) 8 MG disintegrating tablet TAKE 1 TABLET BY MOUTH EVERY 8 HOURS AS NEEDED FOR NAUSEA AND VOMITING 30 tablet 1  . pantoprazole (PROTONIX) 40 MG tablet TAKE 1 TABLET (40 MG TOTAL) BY MOUTH 2 (TWO) TIMES DAILY. 60 tablet 5  . pomalidomide (POMALYST) 3 MG capsule Take 1 capsule (3 mg total) by mouth daily. Take with water on days 1-21. Repeat every 28 days.auth# [id number]   09/06/15 21 capsule 0  . predniSONE (DELTASONE) 20 MG tablet 3 tabs po daily x 3 days, then 2 tabs x 3 days, then 1.5 tabs x 3 days, then 1 tab x 3 days, then 0.5 tabs x 3 days 27 tablet 0  . promethazine-dextromethorphan (PROMETHAZINE-DM) 6.25-15 MG/5ML syrup TAKE 5MLS BY MOUTH 4 TIMES A DAY AS NEEDED FOR COUGH 180 mL 0  . fexofenadine (ALLEGRA) 60 MG tablet Take 1 tablet daily as needed for allergies 90 tablet 1   No current facility-administered medications on file prior to visit.    Medical History:  Past  Medical History  Diagnosis Date  . Hypercholesterolemia   . Compression fracture 04/08/2007    pathologic compression fracture  . Hypothyroidism   . FHx: chemotherapy     s/p 5 cycle revlimid/low dose decadron,s/p velcade,doxil,decadron,  . Hx of radiation therapy 05/05/07-05/18/07,& 03/05/11-03/21/11-    l3&l5 in 2008, t2-t6 03/2011  . GERD (gastroesophageal reflux disease)   . Insomnia     associated with steroids  . Constipation     takes oxycontin,vicodin  . Hx of radiation therapy 05/05/2007 to  05/18/2007    palliative, L3-5  . Hx of radiation therapy 03/05/2011 to 03/21/2011    palliative T2-T6, c-spine  . History of autologous stem cell transplant ([place]) 11/20/2007    [facility], Dr Valarie Merino  . PONV (postoperative nausea and vomiting)   . Multiple myeloma ([place]) dx'd 2009  . Metastasis to bone ([place])   . Family history of anesthesia complication     "daughter gets PONV too"  . Pneumonia     "several times"  . Stroke [facility]) 2014    denies residual on 01/27/2014    Allergies: No Known Allergies   Review of Systems:  Review of Systems  Constitutional: Negative for fever, chills and malaise/fatigue.  HENT: Negative for congestion, ear pain and sore throat.   Eyes: Negative.   Respiratory: Negative for cough, shortness of breath and wheezing.   Cardiovascular: Negative for chest pain, palpitations and leg swelling.  Gastrointestinal: Positive for diarrhea. Negative for heartburn, abdominal pain, constipation, blood in stool and melena.  Genitourinary: Negative.   Skin: Negative.   Neurological: Negative for dizziness, sensory change, loss of consciousness and headaches.  Psychiatric/Behavioral: Negative for depression. The patient is not nervous/anxious and does not have insomnia.     Family history- Review and unchanged  Social history- Review and unchanged  Physical Exam: BP 126/72 mmHg  Pulse 80  Temp(Src) 97.8 F (36.6 C) (Temporal)  Resp 18  Ht _0  (1.651 m)  Wt 207 lb  (93.895 kg)  BMI 34.45 kg/m2 Wt Readings from Last 3 Encounters:  10/03/15 207 lb (93.895 kg)  10/02/15 209 lb 8 oz (95.029 kg)  09/21/15 202 lb 1.6 oz (91.672 kg)    General Appearance: Well nourished well developed, in no apparent distress. Eyes: PERRLA, EOMs, conjunctiva no swelling or erythema ENT/Mouth: Ear canals normal without obstruction, swelling, erythma, discharge.  TMs normal bilaterally.  Oropharynx moist, clear, without exudate, or postoropharyngeal swelling. Neck: Supple, thyroid normal,no cervical adenopathy.  Tenderness to palpation of the C7 area.  Palpable bilateral trapezius muscle spasms.  Respiratory: Respiratory effort normal, Breath sounds clear A&P without rhonchi, wheeze, or rale.  No retractions, no accessory usage. Cardio: RRR with no MRGs. Brisk peripheral pulses without edema.  Abdomen: Soft, + BS,  Non tender, no guarding, rebound, hernias, masses. Musculoskeletal: Full ROM, 5/5 strength, Normal gait Skin: Warm, dry without rashes, lesions, ecchymosis.  Neuro: Awake and oriented X 3, Cranial nerves intact. Normal muscle tone, no cerebellar symptoms. Psych: Normal affect, Insight and Judgment appropriate.    Starlyn Skeans, [name] 11:11 AM [facility]

## 2015-10-04 ENCOUNTER — Other Ambulatory Visit: Payer: Self-pay | Admitting: Internal Medicine

## 2015-10-04 MED ORDER — CYCLOBENZAPRINE HCL 10 MG PO TABS: 10.0000 mg | ORAL_TABLET | Freq: Three times a day (TID) | ORAL | Status: DC | PRN

## 2015-10-09 ENCOUNTER — Other Ambulatory Visit: Payer: Self-pay | Admitting: Medical Oncology

## 2015-10-09 DIAGNOSIS — C9000 Multiple myeloma not having achieved remission: Secondary | ICD-10-CM

## 2015-10-09 MED ORDER — POMALIDOMIDE 2 MG PO CAPS: 2.0000 mg | ORAL_CAPSULE | Freq: Every day | ORAL | Status: DC

## 2015-10-10 ENCOUNTER — Telehealth: Payer: Self-pay | Admitting: *Deleted

## 2015-10-10 ENCOUNTER — Other Ambulatory Visit: Payer: Self-pay | Admitting: Medical Oncology

## 2015-10-10 MED ORDER — POMALIDOMIDE 2 MG PO CAPS: 2.0000 mg | ORAL_CAPSULE | Freq: Every day | ORAL | Status: DC

## 2015-10-10 NOTE — Telephone Encounter (Signed)
TC from patient inquiring about refill on her Pomalyst through [facility].  Diane, RN with Dr. Julien Nordmann to address this.

## 2015-10-10 NOTE — Progress Notes (Signed)
I called [facility] and cancelled Pomalyst for order from yesterday . Refill sent to [facility]

## 2015-10-11 DIAGNOSIS — R197 Diarrhea, unspecified: Secondary | ICD-10-CM | POA: Diagnosis not present

## 2015-10-17 ENCOUNTER — Other Ambulatory Visit: Payer: Self-pay | Admitting: Internal Medicine

## 2015-10-17 LAB — FECAL FAT, QUALITATIVE: FECAL FAT QUALITATIVE: NORMAL

## 2015-10-17 MED ORDER — NYSTATIN 100000 UNIT/ML MT SUSP: OROMUCOSAL | Status: DC

## 2015-10-18 ENCOUNTER — Other Ambulatory Visit: Payer: Self-pay | Admitting: *Deleted

## 2015-10-18 MED ORDER — NYSTATIN 100000 UNIT/ML MT SUSP: OROMUCOSAL | Status: DC

## 2015-10-21 ENCOUNTER — Other Ambulatory Visit: Payer: Self-pay | Admitting: Internal Medicine

## 2015-10-25 ENCOUNTER — Other Ambulatory Visit: Payer: Self-pay | Admitting: Internal Medicine

## 2015-10-25 MED ORDER — PSEUDOEPH-BROMPHEN-DM 30-2-10 MG/5ML PO SYRP: 5.0000 mL | ORAL_SOLUTION | Freq: Four times a day (QID) | ORAL | Status: DC | PRN

## 2015-10-26 ENCOUNTER — Ambulatory Visit (HOSPITAL_COMMUNITY)
Admission: RE | Admit: 2015-10-26 | Discharge: 2015-10-26 | Disposition: A | Discharge status: Home / Self Care | Payer: Medicare Other | Source: Ambulatory Visit | Attending: Internal Medicine | Admitting: Internal Medicine

## 2015-10-26 DIAGNOSIS — M7138 Other bursal cyst, other site: Secondary | ICD-10-CM | POA: Insufficient documentation

## 2015-10-26 DIAGNOSIS — M47812 Spondylosis without myelopathy or radiculopathy, cervical region: Secondary | ICD-10-CM | POA: Insufficient documentation

## 2015-10-26 DIAGNOSIS — M50221 Other cervical disc displacement at C4-C5 level: Secondary | ICD-10-CM | POA: Diagnosis not present

## 2015-10-26 DIAGNOSIS — M4806 Spinal stenosis, lumbar region: Secondary | ICD-10-CM | POA: Insufficient documentation

## 2015-10-26 DIAGNOSIS — C9000 Multiple myeloma not having achieved remission: Secondary | ICD-10-CM | POA: Diagnosis not present

## 2015-10-26 DIAGNOSIS — M542 Cervicalgia: Secondary | ICD-10-CM

## 2015-10-26 DIAGNOSIS — M8448XA Pathological fracture, other site, initial encounter for fracture: Secondary | ICD-10-CM | POA: Insufficient documentation

## 2015-10-26 MED ORDER — GADOBENATE DIMEGLUMINE 529 MG/ML IV SOLN
19.0000 mL | Freq: Once | INTRAVENOUS | Status: AC | PRN
  Administered 2015-10-26: 19 mL via INTRAVENOUS

## 2015-10-30 ENCOUNTER — Other Ambulatory Visit: Payer: Self-pay | Admitting: Internal Medicine

## 2015-10-30 DIAGNOSIS — F419 Anxiety disorder, unspecified: Secondary | ICD-10-CM

## 2015-10-31 ENCOUNTER — Telehealth: Payer: Self-pay | Admitting: *Deleted

## 2015-10-31 ENCOUNTER — Other Ambulatory Visit (HOSPITAL_BASED_OUTPATIENT_CLINIC_OR_DEPARTMENT_OTHER): Payer: Medicare Other

## 2015-10-31 ENCOUNTER — Telehealth: Payer: Self-pay | Admitting: Internal Medicine

## 2015-10-31 ENCOUNTER — Encounter: Payer: Self-pay | Admitting: Internal Medicine

## 2015-10-31 ENCOUNTER — Ambulatory Visit (HOSPITAL_COMMUNITY)
Admission: RE | Admit: 2015-10-31 | Discharge: 2015-10-31 | Disposition: A | Discharge status: Home / Self Care | Payer: Medicare Other | Source: Ambulatory Visit | Attending: Internal Medicine | Admitting: Internal Medicine

## 2015-10-31 ENCOUNTER — Ambulatory Visit (HOSPITAL_BASED_OUTPATIENT_CLINIC_OR_DEPARTMENT_OTHER): Payer: Medicare Other | Admitting: Internal Medicine

## 2015-10-31 VITALS — BP 122/66 | HR 87 | Temp 99.0°F | Resp 19 | Ht 65.0 in | Wt 205.4 lb

## 2015-10-31 DIAGNOSIS — Z5111 Encounter for antineoplastic chemotherapy: Secondary | ICD-10-CM

## 2015-10-31 DIAGNOSIS — C9002 Multiple myeloma in relapse: Secondary | ICD-10-CM

## 2015-10-31 DIAGNOSIS — C9000 Multiple myeloma not having achieved remission: Secondary | ICD-10-CM | POA: Insufficient documentation

## 2015-10-31 DIAGNOSIS — R05 Cough: Secondary | ICD-10-CM | POA: Diagnosis not present

## 2015-10-31 LAB — COMPREHENSIVE METABOLIC PANEL
ALT: 25 U/L (ref 0–55)
AST: 19 U/L (ref 5–34)
Albumin: 3.3 g/dL — ABNORMAL LOW (ref 3.5–5.0)
Alkaline Phosphatase: 76 U/L (ref 40–150)
Anion Gap: 9 mEq/L (ref 3–11)
BUN: 13.5 mg/dL (ref 7.0–26.0)
CALCIUM: 8.1 mg/dL — AB (ref 8.4–10.4)
CHLORIDE: 96 meq/L — AB (ref 98–109)
CO2: 33 meq/L — AB (ref 22–29)
CREATININE: 1.2 mg/dL — AB (ref 0.6–1.1)
EGFR: 58 mL/min/{1.73_m2} — ABNORMAL LOW (ref >90)
GLUCOSE: 100 mg/dL (ref 70–140)
POTASSIUM: 2.9 meq/L — AB (ref 3.5–5.1)
SODIUM: 138 meq/L (ref 136–145)
Total Bilirubin: 0.83 mg/dL (ref 0.20–1.20)
Total Protein: 7 g/dL (ref 6.4–8.3)

## 2015-10-31 LAB — CBC WITH DIFFERENTIAL/PLATELET
BASO%: 0.4 % (ref 0.0–2.0)
Basophils Absolute: 0 10*3/uL (ref 0.0–0.1)
EOS%: 4 % (ref 0.0–7.0)
Eosinophils Absolute: 0.1 10*3/uL (ref 0.0–0.5)
HEMATOCRIT: 34.7 % — AB (ref 34.8–46.6)
HGB: 11.4 g/dL — ABNORMAL LOW (ref 11.6–15.9)
LYMPH#: 1 10*3/uL (ref 0.9–3.3)
LYMPH%: 37.5 % (ref 14.0–49.7)
MCH: 33.3 pg (ref 25.1–34.0)
MCHC: 32.9 g/dL (ref 31.5–36.0)
MCV: 101.5 fL — ABNORMAL HIGH (ref 79.5–101.0)
MONO#: 0.2 10*3/uL (ref 0.1–0.9)
MONO%: 8.4 % (ref 0.0–14.0)
NEUT#: 1.4 10*3/uL — ABNORMAL LOW (ref 1.5–6.5)
NEUT%: 49.7 % (ref 38.4–76.8)
Platelets: 164 10*3/uL (ref 145–400)
RBC: 3.42 10*6/uL — AB (ref 3.70–5.45)
RDW: 14.6 % — ABNORMAL HIGH (ref 11.2–14.5)
WBC: 2.8 10*3/uL — ABNORMAL LOW (ref 3.9–10.3)

## 2015-10-31 LAB — LACTATE DEHYDROGENASE: LDH: 161 U/L (ref 125–245)

## 2015-10-31 MED ORDER — MORPHINE SULFATE ER 15 MG PO TBCR: 15.0000 mg | EXTENDED_RELEASE_TABLET | Freq: Two times a day (BID) | ORAL | Status: DC

## 2015-10-31 MED ORDER — HYDROCODONE-ACETAMINOPHEN 7.5-325 MG PO TABS: 1.0000 | ORAL_TABLET | Freq: Four times a day (QID) | ORAL | Status: DC | PRN

## 2015-10-31 NOTE — Telephone Encounter (Signed)
Per MD pt to take K+ 45mq qd.  Pt previously given a rx from Dr. MMelford Aasefor K+ 14m 1 PO BID.  Attempt to reach pt, voicemail not set up. Will tray again later today.

## 2015-10-31 NOTE — Telephone Encounter (Signed)
spoke w/ pt confirmed appt for [name]

## 2015-10-31 NOTE — Progress Notes (Signed)
[place] [phone]   Fax:([phone]) [place], [place] [address]  DIAGNOSIS: Recurrent multiple myeloma initially diagnosed in October 2008.   PRIOR THERAPY:  1. Status post palliative radiotherapy to the lumbar spine between L3 and L5. The patient received a total dose of 3000 cGy in 10 fractions under the care of Dr. Lisbeth Renshaw between May 05, 2007 through May 18, 2007. 2. Status post 5 cycles of systemic chemotherapy with Revlimid and low-dose Decadron with good response to this treatment. 3. Status post autologous peripheral blood stem cell transplant at [facility] on Nov 20, 2007 under the care of Dr. Valarie Merino. 4. Status post treatment for disease recurrence with Velcade, Doxil and Decadron. Last dose given Nov 09, 2009. Discontinued secondary to intolerance but the patient had a good response to treatment at that time. 5. Status post palliative radiotherapy to the T2-T6 thoracic vertebrae completed 03/21/2011 under the care of Dr. Lisbeth Renshaw. 6. Systemic chemotherapy with Velcade 1.3 mg per meter squared given on days 1, 4, 8 and 11, and Doxil at 30 mg per meter squared given on day 4 in addition to Decadron status post 4 cycles, discontinued secondary to intolerance. 7. Systemic therapy with Velcade 1.3 mg/M2 subcutaneously in addition to Decadron 40 mg by mouth on a weekly basis, status post 20 cycles. The patient had good response with this treatment but it is discontinue today secondary to worsening peripheral neuropathy. 8. Palliative radiotherapy to the skull lesion as well as the left hip area under the care of Dr. Lisbeth Renshaw. 9. Systemic chemotherapy with Carfilzomib 20 mg/M2 on days 1, 2,  8, 9, 13 and 16 every 4 weeks in addition to weekly Decadron 40 mg by mouth. First dose on 04/19/2013. Status post 4 cycles, discontinued recently secondary to cardiac  dysfunction.  CURRENT THERAPY:  1. Pomalyst 3 mg by mouth daily for 21 days every 4 weeks in addition to dexamethasone 40 mg on a weekly basis. First dose started 12/02/2013. Status post 22 cycles. She will start cycle #24 on 11/07/2015. Her dose of Pomalyst will be reduced to 2 mg by mouth daily for 21 days every 4 weeks starting from cycle 23.  2. Zometa 4 mg IV given every 3 months.   INTERVAL HISTORY: [name] 65 y.o. female returns to the clinic today for follow up visit accompanied by her daughter. The patient is feeling fine today with no specific complaints except for chest congestion and cough started 2 weeks ago. She was seen by her primary care physician and was treated with prednisone as well as antibiotics with mild improvement. She was recommended to have chest x-ray but the patient declined to have it done and she was worried about being admitted to the hospital. She is currently on Pomalyst 2 mg by mouth daily for 21 days every 4 weeks status post 1 cycle of this treatment and tolerated it well with less diarrhea. She also had recent imaging studies including MRI of the cervical as well as lumbar spines for evaluation of back and neck pain. The patient denied having any significant chest pain, shortness of breath, or hemoptysis. She has no nausea or vomiting. She has no weight loss or night sweats. She continues to have mild peripheral neuropathy and currently on gabapentin. She had repeat CBC and comprehensive metabolic panel earlier today and she is here for evaluation and discussion of her  lab results.  MEDICAL HISTORY: Past Medical History  Diagnosis Date  . Hypercholesterolemia   . Compression fracture 04/08/2007    pathologic compression fracture  . Hypothyroidism   . FHx: chemotherapy     s/p 5 cycle revlimid/low dose decadron,s/p velcade,doxil,decadron,  . Hx of radiation therapy 05/05/07-05/18/07,& 03/05/11-03/21/11-    l3&l5 in 2008, t2-t6 03/2011  . GERD  (gastroesophageal reflux disease)   . Insomnia     associated with steroids  . Constipation     takes oxycontin,vicodin  . Hx of radiation therapy 05/05/2007 to 05/18/2007    palliative, L3-5  . Hx of radiation therapy 03/05/2011 to 03/21/2011    palliative T2-T6, c-spine  . History of autologous stem cell transplant ([place]) 11/20/2007    [facility], Dr Valarie Merino  . PONV (postoperative nausea and vomiting)   . Multiple myeloma ([place]) dx'd 2009  . Metastasis to bone ([place])   . Family history of anesthesia complication     "daughter gets PONV too"  . Pneumonia     "several times"  . Stroke [facility]) 2014    denies residual on 01/27/2014    ALLERGIES:  has No Known Allergies.  MEDICATIONS:  Current Outpatient Prescriptions  Medication Sig Dispense Refill  . ALPRAZolam (XANAX) 0.5 MG tablet TAKE 1/2 TO 1 TABLET 2 TO 3 TIMES DAILY AS NEEDED FOR ANXIETY 90 tablet 2  . aspirin 81 MG tablet Take 81 mg by mouth daily.    . brompheniramine-pseudoephedrine-DM 30-2-10 MG/5ML syrup Take 5 mLs by mouth 4 (four) times daily as needed. 473 mL 2  . cholestyramine (QUESTRAN) 4 g packet Take 1 packet by mouth 2 (two) times daily. Please take 2-3 hours after taking your regular medications 60 each 11  . citalopram (CELEXA) 40 MG tablet Take 1 tablet (40 mg total) by mouth daily. (Patient taking differently: Take 20 mg by mouth daily. ) 90 tablet 2  . cyclobenzaprine (FLEXERIL) 10 MG tablet Take 1 tablet (10 mg total) by mouth 3 (three) times daily as needed for muscle spasms. 90 tablet 3  . dexamethasone (DECADRON) 4 MG tablet TAKE (10) TABLETS BY MOUTH EVERY FRIDAY 40 tablet 1  . DIPHENHIST 12.5 MG/5ML liquid     . diphenoxylate-atropine (LOMOTIL) 2.5-0.025 MG tablet TAKE 2 TABLETS BY MOUTH 4 TIMES A DAY AS NEEDED FOR DIARRHEA 30 tablet 1  . erythromycin ophthalmic ointment     . Eszopiclone 3 MG TABS Take 1 tablet (3 mg total) by mouth at bedtime as needed. for sleep 30 tablet 2  . furosemide (LASIX) 40 MG tablet TAKE  1 TABLET BY MOUTH TWICE A DAY FOR FLUID AND SWELLING 60 tablet 3  . gabapentin (NEURONTIN) 600 MG tablet TAKE 1 TABLET 4 X DAILY AS NEEDED FOR PAIN OR CRAMPS 120 tablet 4  . HYDROcodone-acetaminophen (NORCO) 7.5-325 MG tablet Take 1 tablet by mouth every 6 (six) hours as needed for moderate pain. 30 tablet 0  . hyoscyamine (LEVSIN SL) 0.125 MG SL tablet Take 1 tablet (0.125 mg total) by mouth every 6 (six) hours as needed. 90 tablet 0  . KLOR-CON 10 10 MEQ tablet TAKE 1 TABLET BY MOUTH TWICE A DAY 180 tablet 1  . levothyroxine (SYNTHROID, LEVOTHROID) 100 MCG tablet Take 100 mcg by mouth daily before breakfast.     . loperamide (IMODIUM) 2 MG capsule Take 2 mg by mouth as needed for diarrhea or loose stools. Reported on 10/02/2015    . Magnesium Hydroxide 400 MG CHEW Chew 2 tablets by  mouth daily.     . mometasone (NASONEX) 50 MCG/ACT nasal spray Place 2 sprays into the nose daily. 17 g 2  . montelukast (SINGULAIR) 10 MG tablet Take 1 tablet (10 mg total) by mouth daily. 30 tablet 2  . morphine (MS CONTIN) 15 MG 12 hr tablet Take 1 tablet (15 mg total) by mouth every 12 (twelve) hours. 60 tablet 0  . Multiple Vitamin (MULITIVITAMIN WITH MINERALS) TABS Take 1 tablet by mouth every morning.     . nystatin (MYCOSTATIN) 100000 UNIT/ML suspension Gargle 5 mL PO q6hours and spit out after 20-30 seconds.  Use for 1 week 180 mL 0  . ondansetron (ZOFRAN-ODT) 8 MG disintegrating tablet TAKE 1 TABLET BY MOUTH EVERY 8 HOURS AS NEEDED FOR NAUSEA AND VOMITING 30 tablet 1  . pantoprazole (PROTONIX) 40 MG tablet TAKE 1 TABLET (40 MG TOTAL) BY MOUTH 2 (TWO) TIMES DAILY. 60 tablet 5  . pomalidomide (POMALYST) 2 MG capsule Take 1 capsule (2 mg total) by mouth daily. Take with water on days 1-21. Repeat every 28 days. 10/09/15 auth number [id number]. Faxed to [employer] 21 capsule 0  . predniSONE (DELTASONE) 20 MG tablet 3 tabs po daily x 3 days, then 2 tabs x 3 days, then 1.5 tabs x 3 days, then 1 tab x 3 days, then 0.5 tabs  x 3 days 27 tablet 0  . promethazine-dextromethorphan (PROMETHAZINE-DM) 6.25-15 MG/5ML syrup TAKE 5MLS BY MOUTH 4 TIMES A DAY AS NEEDED FOR COUGH 180 mL 0  . fexofenadine (ALLEGRA) 60 MG tablet Take 1 tablet daily as needed for allergies 90 tablet 1  . metolazone (ZAROXOLYN) 2.5 MG tablet Please take 1 tablet on Wednesday and Sunday in the morning for swelling. 30 tablet 1   No current facility-administered medications for this visit.    SURGICAL HISTORY:  Past Surgical History  Procedure Laterality Date  . Knee arthroscopy Right     "put pin in"  . Knee arthroscopy Right     "took pin out and corrected what was wrong"  . Video bronchoscopy  07/30/2011    Procedure: VIDEO BRONCHOSCOPY WITHOUT FLUORO;  Surgeon: Kathee Delton, MD;  Location: [facility];  Service: Cardiopulmonary;  Laterality: Bilateral;  . Cholecystectomy  1980's  . Portacath placement Right 2009  . Vaginal hysterectomy  1980's    REVIEW OF SYSTEMS:  Constitutional: positive for fatigue Eyes: negative Ears, nose, mouth, throat, and face: negative Respiratory: positive for cough, dyspnea on exertion and sputum Cardiovascular: negative Gastrointestinal: negative Genitourinary:negative Integument/breast: negative Hematologic/lymphatic: negative Musculoskeletal:positive for back pain and neck pain Neurological: negative Behavioral/Psych: negative Endocrine: negative Allergic/Immunologic: negative   PHYSICAL EXAMINATION: General appearance: alert, cooperative, fatigued and no distress Head: Normocephalic, without obvious abnormality, atraumatic Neck: no adenopathy, no JVD, supple, symmetrical, trachea midline and thyroid not enlarged, symmetric, no tenderness/mass/nodules Lymph nodes: Cervical, supraclavicular, and axillary nodes normal. Resp: clear to auscultation bilaterally Back: symmetric, no curvature. ROM normal. No CVA tenderness. Cardio: regular rate and rhythm, S1, S2 normal, no murmur, click, rub or  gallop GI: soft, non-tender; bowel sounds normal; no masses,  no organomegaly Extremities: edema 1+ edema of left lower extremity. Neurologic: Alert and oriented X 3, normal strength and tone. Normal symmetric reflexes. Normal coordination and gait  ECOG PERFORMANCE STATUS: 1 - Symptomatic but completely ambulatory  Blood pressure 122/66, pulse 87, temperature 99 F (37.2 C), temperature source Oral, resp. rate 19, height _0  (1.651 m), weight 205 lb 6.4 oz (93.169 kg), SpO2 100 %.  LABORATORY DATA: Lab Results  Component Value Date   WBC 2.8* 10/31/2015   HGB 11.4* 10/31/2015   HCT 34.7* 10/31/2015   MCV 101.5* 10/31/2015   PLT 164 10/31/2015      Chemistry      Component Value Date/Time   NA 134* 10/03/2015 1147   NA 138 10/02/2015 1140   K 4.2 10/03/2015 1147   K 4.1 10/02/2015 1140   CL 99 10/03/2015 1147   CL 105 12/17/2012 1015   CO2 29 10/03/2015 1147   CO2 29 10/02/2015 1140   BUN 20 10/03/2015 1147   BUN 13.8 10/02/2015 1140   CREATININE 0.89 10/03/2015 1147   CREATININE 0.9 10/02/2015 1140   CREATININE 0.82 05/05/2015 1400      Component Value Date/Time   CALCIUM 9.2 10/03/2015 1147   CALCIUM 10.0 10/02/2015 1140   ALKPHOS 56 10/03/2015 1147   ALKPHOS 65 10/02/2015 1140   AST 12 10/03/2015 1147   AST 14 10/02/2015 1140   ALT 27 10/03/2015 1147   ALT 28 10/02/2015 1140   BILITOT 0.8 10/03/2015 1147   BILITOT 0.85 10/02/2015 1140     Myeloma panel: Beta-2 microglobulin 2.2, free Light chain 21.43, free lambda light chain 96.96, kappa/lambda ratio 0.22. IgG 943, IgG 119 and IgM 14.  RADIOGRAPHIC STUDIES: Mr Lumbar Spine Wo Contrast  10/26/2015  CLINICAL DATA:  Low back pain. Bilateral lower extremity pain. Left-sided weakness. Lower extremity numbness. Bowel changes. History of multiple myeloma. EXAM: MRI LUMBAR SPINE WITHOUT CONTRAST TECHNIQUE: Multiplanar, multisequence MR imaging of the lumbar spine was performed. No intravenous contrast was  administered. COMPARISON:  10/27/2014 FINDINGS: Chronic fracture involving the anterior L2 vertebral body including the superior endplate is unchanged, as is chronic marrow signal abnormality at this level. Chronic, severe pathologic L4 vertebral fracture demonstrates unchanged height loss and unchanged, mild residual STIR signal abnormality. Other lumbar vertebral body heights are preserved. No new lesions suspicious for myeloma or evidence of epidural tumor are identified on this unenhanced study. There is very mild edema associated with bilateral facet arthritis in the mid and lower lumbar spine. Conus medullaris is normal in signal and terminates at L1. Paraspinal soft tissues are unremarkable. L1-2: Slight facet hypertrophy and minimal disc bulging without stenosis, unchanged. L2-3: Left foraminal/ extraforaminal disc extrusion and mild facet hypertrophy result in mild-to-moderate left neural foraminal stenosis, unchanged. No spinal stenosis. L3-4: Circumferential disc bulging slightly greater to the right and severe facet in ligamentum flavum hypertrophy result in mild spinal stenosis and mild left lateral recess stenosis at the disc space level, slightly progressed from prior. Mild right neural foraminal stenosis is unchanged. L4-5: 10 x 5 mm synovial cyst at the medial aspect of the right facet joint and appears slightly larger than on the prior study and results in severe right lateral recess stenosis and potential right L5 nerve root impingement. Additionally, there is a prominent T2 hyperintense/cystic structure in the left lateral recess which is either new or much larger than on the prior study, likely a synovial cyst arising from the left facet joint. This extends from the medial aspect of the left facet joint superiorly in the left lateral recess to the L4 superior endplate level. This measures approximately 11 x 5 mm on transverse images and extends over a craniocaudal length of approximately 3 cm.  This results in increased, severe left lateral recess stenosis with likely left L5 nerve root impingement. A left central disc protrusion and prominent ligamentum flavum hypertrophy contribute to moderate spinal  stenosis. Mild left neural foraminal narrowing is unchanged. L5-S1: Minimal disc bulging and mild facet arthrosis without stenosis. IMPRESSION: 1. New, large synovial cyst in the left lateral recess at L4-5 resulting in severe lateral recess stenosis and likely L5 nerve root impingement. 2. Slightly increased size of right sided synovial cyst at this level with severe right lateral recess stenosis. 3. Mild multifactorial spinal stenosis at L3-4, slightly progressed from prior. 4. Unchanged, chronic L2 and L4 vertebral fractures. No new fracture or evidence of new myelomatous lesions. Electronically Signed   By: Logan Bores M.D.   On: 10/26/2015 17:56   Mr Cervical Spine W Wo Contrast  10/26/2015  CLINICAL DATA:  65 year old female with neck pain and history of multiple myeloma. Subsequent encounter. EXAM: MRI CERVICAL SPINE WITHOUT AND WITH CONTRAST TECHNIQUE: Multiplanar and multiecho pulse sequences of the cervical spine, to include the craniocervical junction and cervicothoracic junction, were obtained according to standard protocol without and with intravenous contrast. CONTRAST:  65m MULTIHANCE GADOBENATE DIMEGLUMINE 529 MG/ML IV SOLN COMPARISON:  01/28/2014 cervical spine MR. FINDINGS: Exam is motion degraded. Patient moved between sagittal and axial imaging. Cervical medullary junction and visualized intracranial structures unremarkable. No focal cervical cord signal abnormality or enhancement. Changes consistent with prior radiation therapy involving the cervical spine and upper portion of the thoracic spine visualized. Multiple osseous lesions consistent with myeloma involvement throughout majority of visualized vertebra. Largest abnormalities once again noted involving the left aspect of T2.  When compared to the prior examination, there has been a slight progression of myeloma involvement (best appreciated superior aspect of the T5 vertebral body). No evidence of epidural extension of myeloma. No worrisome paravertebral abnormality. Cervical spondylotic changes including: C2-3:  Negative. C3-4:  Minimal left foraminal narrowing. C4-5: Minimal bulge. Minimal narrowing ventral thecal sac. Minimal foraminal narrowing. C5-6: Broad-based disc osteophyte complex with greatest extension left paracentral/ posterior lateral position with spinal stenosis and cord flattening greater on left. Moderate left-sided and mild right-sided foraminal narrowing. C6-7: Broad-based disc osteophyte complex greatest left paracentral position. Spinal stenosis and cord flattening greater on the left. Moderate left-sided and mild right-sided foraminal narrowing. C7-T1:  Negative. T1-2: Bulge greater to left. Axial imaging not performed. No cord compression. IMPRESSION: Myeloma involvement of cervical and upper thoracic cord have progressed minimally when compared to the prior exam as detailed above. No epidural extension tumor. Cervical spondylotic changes once again most notable on the left at the C5-6 and C6-7 level and minimally changed from prior exam. Please see above for further detail. Electronically Signed   By: SGenia DelM.D.   On: 10/26/2015 21:42   ASSESSMENT AND PLAN:  This is a very pleasant 65years old [place] female with recurrent multiple myeloma recently completed a course of treatment with Velcade and Decadron with improvement in her disease but this was discontinued secondary to peripheral neuropathy. The patient tolerated her treatment with Carfilzomib and Decadron fairly well except for the recent shortness breath and cough which was felt to be secondary to congestive heart failure from her treatment with Carfilzomib. This treatment was discontinued. She was started on treatment with Pomalyst  and Decadron status post 23 cycles. She tolerated the last cycle of her treatment well. I recommended for the patient to continue her treatment with Pomalyst at reduced dose of 2 mg. She is expected to start the next cycle on 11/07/2015. I would see the patient back for follow-up visit in one month for reevaluation with repeat myeloma panel for evaluation of  her disease. For the myeloma panel bone lesions, the patient will continue her current treatment with Zometa every 3 months as scheduled.  For the back pain and recent finding on MRI of the lumbar spines, I will refer the patient to orthopedic surgery for evaluation. She was given a refill of MS Contin and Vicodin. For the persistent chest congestion and cough questionable for bronchitis, I ordered a chest x-ray to rule out any pneumonia. She was advised to call immediately if she has any concerning symptoms in the interval. The patient voices understanding of current disease status and treatment options and is in agreement with the current care plan.  All questions were answered. The patient knows to call the clinic with any problems, questions or concerns. We can certainly see the patient much sooner if necessary.  Disclaimer: This note was dictated with voice recognition software. Similar sounding words can inadvertently be transcribed and may be missed upon review.

## 2015-10-31 NOTE — Telephone Encounter (Signed)
Notified pt MD has instructed her to take 2 tablets (78mq) BID for a total of 446m daily. Pt verbalized understanding.

## 2015-11-01 ENCOUNTER — Telehealth: Payer: Self-pay | Admitting: *Deleted

## 2015-11-01 ENCOUNTER — Encounter: Payer: Self-pay | Admitting: Gastroenterology

## 2015-11-01 NOTE — Telephone Encounter (Signed)
TC from patient requesting phone # of the orthopedic office she has been referred to. Patient states she cannot find the piece of paper with that information on it.  Pt has been referred to [facility] '@336'$ [phone]. Pt voiced understanding of # and office location.

## 2015-11-03 DIAGNOSIS — M545 Low back pain: Secondary | ICD-10-CM | POA: Diagnosis not present

## 2015-11-03 DIAGNOSIS — M542 Cervicalgia: Secondary | ICD-10-CM | POA: Diagnosis not present

## 2015-11-03 DIAGNOSIS — Z8579 Personal history of other malignant neoplasms of lymphoid, hematopoietic and related tissues: Secondary | ICD-10-CM | POA: Diagnosis not present

## 2015-11-03 DIAGNOSIS — C9000 Multiple myeloma not having achieved remission: Secondary | ICD-10-CM | POA: Diagnosis not present

## 2015-11-07 ENCOUNTER — Other Ambulatory Visit: Payer: Self-pay | Admitting: Medical Oncology

## 2015-11-07 DIAGNOSIS — C9000 Multiple myeloma not having achieved remission: Secondary | ICD-10-CM

## 2015-11-07 MED ORDER — POMALIDOMIDE 2 MG PO CAPS: 2.0000 mg | ORAL_CAPSULE | Freq: Every day | ORAL | Status: DC

## 2015-11-08 ENCOUNTER — Other Ambulatory Visit: Payer: Self-pay | Admitting: Medical Oncology

## 2015-11-08 DIAGNOSIS — C9000 Multiple myeloma not having achieved remission: Secondary | ICD-10-CM

## 2015-11-08 MED ORDER — POMALIDOMIDE 2 MG PO CAPS: 2.0000 mg | ORAL_CAPSULE | Freq: Every day | ORAL | Status: DC

## 2015-11-09 ENCOUNTER — Encounter: Payer: Self-pay | Admitting: Internal Medicine

## 2015-11-09 ENCOUNTER — Ambulatory Visit (HOSPITAL_BASED_OUTPATIENT_CLINIC_OR_DEPARTMENT_OTHER): Payer: Medicare Other | Admitting: Nurse Practitioner

## 2015-11-09 ENCOUNTER — Ambulatory Visit (HOSPITAL_BASED_OUTPATIENT_CLINIC_OR_DEPARTMENT_OTHER): Payer: Medicare Other

## 2015-11-09 ENCOUNTER — Telehealth: Payer: Self-pay

## 2015-11-09 ENCOUNTER — Encounter: Payer: Self-pay | Admitting: *Deleted

## 2015-11-09 ENCOUNTER — Other Ambulatory Visit (HOSPITAL_BASED_OUTPATIENT_CLINIC_OR_DEPARTMENT_OTHER): Payer: Medicare Other

## 2015-11-09 VITALS — BP 137/77 | HR 102 | Temp 98.2°F | Resp 18 | Ht 65.0 in | Wt 202.1 lb

## 2015-11-09 DIAGNOSIS — E876 Hypokalemia: Secondary | ICD-10-CM

## 2015-11-09 DIAGNOSIS — E860 Dehydration: Secondary | ICD-10-CM

## 2015-11-09 DIAGNOSIS — R112 Nausea with vomiting, unspecified: Secondary | ICD-10-CM

## 2015-11-09 DIAGNOSIS — C9002 Multiple myeloma in relapse: Secondary | ICD-10-CM

## 2015-11-09 DIAGNOSIS — C9000 Multiple myeloma not having achieved remission: Secondary | ICD-10-CM

## 2015-11-09 DIAGNOSIS — R110 Nausea: Secondary | ICD-10-CM

## 2015-11-09 DIAGNOSIS — J40 Bronchitis, not specified as acute or chronic: Secondary | ICD-10-CM

## 2015-11-09 LAB — MAGNESIUM: Magnesium: 1.3 mg/dl — CL (ref 1.5–2.5)

## 2015-11-09 LAB — COMPREHENSIVE METABOLIC PANEL
ALT: 23 U/L (ref 0–55)
AST: 17 U/L (ref 5–34)
Albumin: 3.7 g/dL (ref 3.5–5.0)
Alkaline Phosphatase: 86 U/L (ref 40–150)
Anion Gap: 14 mEq/L — ABNORMAL HIGH (ref 3–11)
BUN: 16.4 mg/dL (ref 7.0–26.0)
CO2: 34 meq/L — AB (ref 22–29)
Calcium: 9.5 mg/dL (ref 8.4–10.4)
Chloride: 91 mEq/L — ABNORMAL LOW (ref 98–109)
Creatinine: 2.1 mg/dL — ABNORMAL HIGH (ref 0.6–1.1)
EGFR: 29 mL/min/{1.73_m2} — AB (ref >90)
GLUCOSE: 72 mg/dL (ref 70–140)
POTASSIUM: 2.5 meq/L — AB (ref 3.5–5.1)
SODIUM: 140 meq/L (ref 136–145)
TOTAL PROTEIN: 8 g/dL (ref 6.4–8.3)
Total Bilirubin: 1.18 mg/dL (ref 0.20–1.20)

## 2015-11-09 LAB — CBC WITH DIFFERENTIAL/PLATELET
BASO%: 0.6 % (ref 0.0–2.0)
Basophils Absolute: 0 10*3/uL (ref 0.0–0.1)
EOS ABS: 0.1 10*3/uL (ref 0.0–0.5)
EOS%: 0.9 % (ref 0.0–7.0)
HCT: 36.5 % (ref 34.8–46.6)
HEMOGLOBIN: 12.3 g/dL (ref 11.6–15.9)
LYMPH%: 37.6 % (ref 14.0–49.7)
MCH: 33.9 pg (ref 25.1–34.0)
MCHC: 33.7 g/dL (ref 31.5–36.0)
MCV: 100.6 fL (ref 79.5–101.0)
MONO#: 1.1 10*3/uL — ABNORMAL HIGH (ref 0.1–0.9)
MONO%: 17.4 % — AB (ref 0.0–14.0)
NEUT%: 43.5 % (ref 38.4–76.8)
NEUTROS ABS: 2.9 10*3/uL (ref 1.5–6.5)
Platelets: 210 10*3/uL (ref 145–400)
RBC: 3.63 10*6/uL — AB (ref 3.70–5.45)
RDW: 14.9 % — AB (ref 11.2–14.5)
WBC: 6.6 10*3/uL (ref 3.9–10.3)
lymph#: 2.5 10*3/uL (ref 0.9–3.3)

## 2015-11-09 MED ORDER — ONDANSETRON 8 MG PO TBDP: 8.0000 mg | ORAL_TABLET | Freq: Three times a day (TID) | ORAL | Status: DC | PRN

## 2015-11-09 MED ORDER — SODIUM CHLORIDE 0.9 % IV SOLN
Freq: Once | INTRAVENOUS | Status: AC
  Administered 2015-11-09: 15:00:00 via INTRAVENOUS
  Filled 2015-11-09: qty 1000

## 2015-11-09 MED ORDER — SODIUM CHLORIDE 0.9 % IV SOLN
Freq: Once | INTRAVENOUS | Status: AC
  Administered 2015-11-09: 14:00:00 via INTRAVENOUS
  Filled 2015-11-09: qty 4

## 2015-11-09 MED ORDER — SODIUM CHLORIDE 0.9 % IV SOLN: 4.0000 g | Freq: Once | INTRAVENOUS | Status: DC

## 2015-11-09 MED ORDER — HEPARIN SOD (PORK) LOCK FLUSH 10 UNIT/ML IV SOLN: 10.0000 [IU] | Freq: Once | INTRAVENOUS | Status: DC

## 2015-11-09 MED ORDER — SODIUM CHLORIDE 0.9 % IV SOLN: INTRAVENOUS | Status: DC

## 2015-11-09 MED ORDER — POTASSIUM CHLORIDE 2 MEQ/ML IV SOLN: INTRAVENOUS | Status: DC

## 2015-11-09 MED ORDER — HEPARIN SOD (PORK) LOCK FLUSH 100 UNIT/ML IV SOLN
500.0000 [IU] | Freq: Once | INTRAVENOUS | Status: AC
  Administered 2015-11-09: 500 [IU] via INTRAVENOUS
  Filled 2015-11-09: qty 5

## 2015-11-09 MED ORDER — SODIUM CHLORIDE 0.9% FLUSH
10.0000 mL | INTRAVENOUS | Status: DC | PRN
  Administered 2015-11-09 (×2): 10 mL via INTRAVENOUS
  Filled 2015-11-09: qty 10

## 2015-11-09 MED ORDER — POTASSIUM CHLORIDE CRYS ER 20 MEQ PO TBCR
30.0000 meq | EXTENDED_RELEASE_TABLET | Freq: Once | ORAL | Status: AC
  Administered 2015-11-09: 30 meq via ORAL
  Filled 2015-11-09: qty 1.5

## 2015-11-09 NOTE — Telephone Encounter (Signed)
Pt called stating she was dizzy and having sharp cramps in her neck and hands. Also in her R flank. She had just seen Polo Riley CSW and left [facility] and called from [employer] parking lot. S/w Lauren and she confirmed she saw pt having episodes of pain and having to stop talking, also noted hand motion that reflected pain, also was hanging onto the wall when walking. Pt did not mention dizzyness to Lauren. S/w Selena Lesser and called pt back for labs and to see Cyndee.

## 2015-11-09 NOTE — Progress Notes (Signed)
Per [facility] pomaylst was shipped via [employer] 11/08/15

## 2015-11-09 NOTE — Progress Notes (Signed)
[place] Work  Clinical Social Work met with patient in [place] office today for counseling session.  Patient previously saw CSW in medical oncology area last week and requested today's visit.  Patient shared her mother had recently passed. CSW encouraged patient to explore and process grief related to mother's passing.  CSW validated patient's feelings of sadness during this time and patient shared how she supports other family members who are grieving. Patient updated CSW on recent family dynamics.  CSW discussed healthy coping and communication strategies.  Patient appeared to experience neck/back pain during session, patient had to stop talking multiple times and attempt for pain to pass.  Patient reported pain/cramping in her hands during visit as well. CSW observed patient appeared dizzy when standing up and walking.  [name] indicated she has been dealing with these symptoms recently and is scheduling an appointment with an orthopoedist.  CSW encouraged patient to follow up with CSW as needed.  [name], MSW, LCSW, OSW-C Clinical Social Worker [facility] [phone]

## 2015-11-09 NOTE — Patient Instructions (Signed)

## 2015-11-11 ENCOUNTER — Other Ambulatory Visit: Payer: Self-pay | Admitting: Nurse Practitioner

## 2015-11-11 ENCOUNTER — Encounter: Payer: Self-pay | Admitting: Nurse Practitioner

## 2015-11-11 DIAGNOSIS — E876 Hypokalemia: Secondary | ICD-10-CM

## 2015-11-11 NOTE — Progress Notes (Signed)
SYMPTOM MANAGEMENT CLINIC    Chief Complaint: Nausea/vomiting, and dehydration  HPI:  [name] 65 y.o. female diagnosed with multiple myeloma.  Currently undergoing Pomalyst oral therapy and Zometa.   Patient states she has been suffering with some chronic nausea; but no vomiting for the past few days.  She reports minimal oral intake and feels dehydrated.  Patient will receive IV fluid rehydration; as well as Zofran IV while the cancer Center today.  Also refill patient's Zofran today.  Labs today revealed creatinine has increased to 2.1.  Most likely, elevated creatinine is secondary to dehydration.  Will continue to monitor closely.   No history exists.    Review of Systems  Constitutional: Positive for malaise/fatigue.  Respiratory: Positive for cough. Negative for sputum production, shortness of breath and wheezing.   Gastrointestinal: Positive for nausea and vomiting.  All other systems reviewed and are negative.   Past Medical History  Diagnosis Date  . Hypercholesterolemia   . Compression fracture 04/08/2007    pathologic compression fracture  . Hypothyroidism   . FHx: chemotherapy     s/p 5 cycle revlimid/low dose decadron,s/p velcade,doxil,decadron,  . Hx of radiation therapy 05/05/07-05/18/07,& 03/05/11-03/21/11-    l3&l5 in 2008, t2-t6 03/2011  . GERD (gastroesophageal reflux disease)   . Insomnia     associated with steroids  . Constipation     takes oxycontin,vicodin  . Hx of radiation therapy 05/05/2007 to 05/18/2007    palliative, L3-5  . Hx of radiation therapy 03/05/2011 to 03/21/2011    palliative T2-T6, c-spine  . 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 PONV (postoperative nausea and vomiting)   . Multiple myeloma ([place]) dx'd 2009  . Metastasis to bone ([place])   . Family history of anesthesia complication     "daughter gets PONV too"  . Pneumonia     "several times"  . Stroke [facility]) 2014    denies  residual on 01/27/2014    Past Surgical History  Procedure Laterality Date  . Knee arthroscopy Right     "put pin in"  . Knee arthroscopy Right     "took pin out and corrected what was wrong"  . Video bronchoscopy  07/30/2011    Procedure: VIDEO BRONCHOSCOPY WITHOUT FLUORO;  Surgeon: Kathee Delton, MD;  Location: [facility];  Service: Cardiopulmonary;  Laterality: Bilateral;  . Cholecystectomy  1980's  . Portacath placement Right 2009  . Vaginal hysterectomy  1980's    has Multiple myeloma in relapse ([place]); Hypothyroidism; GERD; Essential hypertension; History of autologous stem cell transplant ([place]); Hyperlipidemia; Medication management; Vitamin D deficiency; CKD stage 3 due to type 2 diabetes mellitus ([place]); Pain and swelling of left lower extremity; Encounter for antineoplastic chemotherapy; Lytic bone lesions on xray; Dehydration; Peripheral edema; Nausea without vomiting; Hypokalemia; Bronchitis; and Hypomagnesemia on her problem list.    has No Known Allergies.    Medication List       This list is accurate as of: 11/09/15 11:59 PM.  Always use your most recent med list.               ALPRAZolam 0.5 MG tablet  Commonly known as:  XANAX  TAKE 1/2 TO 1 TABLET 2 TO 3 TIMES DAILY AS NEEDED FOR ANXIETY     aspirin 81 MG tablet  Take 81 mg by mouth daily.     brompheniramine-pseudoephedrine-DM 30-2-10 MG/5ML syrup  Take 5 mLs by mouth 4 (four)  times daily as needed.     cholestyramine 4 g packet  Commonly known as:  QUESTRAN  Take 1 packet by mouth 2 (two) times daily. Please take 2-3 hours after taking your regular medications     citalopram 40 MG tablet  Commonly known as:  CELEXA  Take 1 tablet (40 mg total) by mouth daily.     cyclobenzaprine 10 MG tablet  Commonly known as:  FLEXERIL  Take 1 tablet (10 mg total) by mouth 3 (three) times daily as needed for muscle spasms.     dexamethasone 4 MG tablet  Commonly known as:  DECADRON  TAKE (10) TABLETS BY MOUTH  EVERY FRIDAY     DIPHENHIST 12.5 MG/5ML liquid  Generic drug:  diphenhydrAMINE     diphenoxylate-atropine 2.5-0.025 MG tablet  Commonly known as:  LOMOTIL  TAKE 2 TABLETS BY MOUTH 4 TIMES A DAY AS NEEDED FOR DIARRHEA     erythromycin ophthalmic ointment     Eszopiclone 3 MG Tabs  Take 1 tablet (3 mg total) by mouth at bedtime as needed. for sleep     fexofenadine 60 MG tablet  Commonly known as:  ALLEGRA  Take 1 tablet daily as needed for allergies     furosemide 40 MG tablet  Commonly known as:  LASIX  TAKE 1 TABLET BY MOUTH TWICE A DAY FOR FLUID AND SWELLING     gabapentin 600 MG tablet  Commonly known as:  NEURONTIN  TAKE 1 TABLET 4 X DAILY AS NEEDED FOR PAIN OR CRAMPS     HYDROcodone-acetaminophen 7.5-325 MG tablet  Commonly known as:  NORCO  Take 1 tablet by mouth every 6 (six) hours as needed for moderate pain.     hyoscyamine 0.125 MG SL tablet  Commonly known as:  LEVSIN SL  Take 1 tablet (0.125 mg total) by mouth every 6 (six) hours as needed.     KLOR-CON 10 10 MEQ tablet  Generic drug:  potassium chloride  TAKE 1 TABLET BY MOUTH TWICE A DAY     levothyroxine 100 MCG tablet  Commonly known as:  SYNTHROID, LEVOTHROID  Take 100 mcg by mouth daily before breakfast.     loperamide 2 MG capsule  Commonly known as:  IMODIUM  Take 2 mg by mouth as needed for diarrhea or loose stools. Reported on 10/02/2015     Magnesium Hydroxide 400 MG Chew  Chew 2 tablets by mouth daily.     metolazone 2.5 MG tablet  Commonly known as:  ZAROXOLYN  Please take 1 tablet on Wednesday and Sunday in the morning for swelling.     mometasone 50 MCG/ACT nasal spray  Commonly known as:  NASONEX  Place 2 sprays into the nose daily.     montelukast 10 MG tablet  Commonly known as:  SINGULAIR  Take 1 tablet (10 mg total) by mouth daily.     morphine 15 MG 12 hr tablet  Commonly known as:  MS CONTIN  Take 1 tablet (15 mg total) by mouth every 12 (twelve) hours.     multivitamin  with minerals Tabs tablet  Take 1 tablet by mouth every morning.     nystatin 100000 UNIT/ML suspension  Commonly known as:  MYCOSTATIN  Gargle 5 mL PO q6hours and spit out after 20-30 seconds.  Use for 1 week     ondansetron 8 MG disintegrating tablet  Commonly known as:  ZOFRAN-ODT  Take 1 tablet (8 mg total) by mouth every 8 (eight) hours  as needed for nausea or vomiting.     pantoprazole 40 MG tablet  Commonly known as:  PROTONIX  TAKE 1 TABLET (40 MG TOTAL) BY MOUTH 2 (TWO) TIMES DAILY.     pomalidomide 2 MG capsule  Commonly known as:  POMALYST  Take 1 capsule (2 mg total) by mouth daily. Take with water on days 1-21. Repeat every 28 days. 11/07/15 auth number [id number] adult female not of childbearing potential. Faxed to [employer]     predniSONE 20 MG tablet  Commonly known as:  DELTASONE  3 tabs po daily x 3 days, then 2 tabs x 3 days, then 1.5 tabs x 3 days, then 1 tab x 3 days, then 0.5 tabs x 3 days     promethazine-dextromethorphan 6.25-15 MG/5ML syrup  Commonly known as:  PROMETHAZINE-DM  TAKE 5MLS BY MOUTH 4 TIMES A DAY AS NEEDED FOR COUGH         PHYSICAL EXAMINATION  Oncology Vitals 11/09/2015 10/31/2015  Height 165 cm 165 cm  Weight 91.672 kg 93.169 kg  Weight (lbs) 202 lbs 2 oz 205 lbs 6 oz  BMI (kg/m2) 33.63 kg/m2 34.18 kg/m2  Temp 98.2 99  Pulse 102 87  Resp 18 19  SpO2 99 100  BSA (m2) 2.05 m2 2.07 m2   BP Readings from Last 2 Encounters:  11/09/15 137/77  10/31/15 122/66    Physical Exam  Constitutional: She is oriented to person, place, and time and well-developed, well-nourished, and in no distress.  HENT:  Head: Normocephalic and atraumatic.  Mouth/Throat: Oropharynx is clear and moist.  Eyes: Conjunctivae and EOM are normal. Pupils are equal, round, and reactive to light. Right eye exhibits no discharge. Left eye exhibits no discharge. No scleral icterus.  Neck: Normal range of motion. Neck supple. No JVD present. No tracheal deviation present.  No thyromegaly present.  Cardiovascular: Normal rate, regular rhythm, normal heart sounds and intact distal pulses.   Pulmonary/Chest: Effort normal and breath sounds normal. No respiratory distress. She has no wheezes. She has no rales. She exhibits no tenderness.  Abdominal: Soft. Bowel sounds are normal. She exhibits no distension and no mass. There is no tenderness. There is no rebound and no guarding.  Musculoskeletal: Normal range of motion. She exhibits no edema or tenderness.  Lymphadenopathy:    She has no cervical adenopathy.  Neurological: She is alert and oriented to person, place, and time. Gait normal.  Skin: Skin is warm and dry. No rash noted. No erythema. No pallor.  Psychiatric: Affect normal.  Nursing note and vitals reviewed.   LABORATORY DATA:. Appointment on 11/09/2015  Component Date Value Ref Range Status  . WBC 11/09/2015 6.6  3.9 - 10.3 10e3/uL Final  . NEUT# 11/09/2015 2.9  1.5 - 6.5 10e3/uL Final  . HGB 11/09/2015 12.3  11.6 - 15.9 g/dL Final  . HCT 11/09/2015 36.5  34.8 - 46.6 % Final  . Platelets 11/09/2015 210  145 - 400 10e3/uL Final  . MCV 11/09/2015 100.6  79.5 - 101.0 fL Final  . MCH 11/09/2015 33.9  25.1 - 34.0 pg Final  . MCHC 11/09/2015 33.7  31.5 - 36.0 g/dL Final  . RBC 11/09/2015 3.63* 3.70 - 5.45 10e6/uL Final  . RDW 11/09/2015 14.9* 11.2 - 14.5 % Final  . lymph# 11/09/2015 2.5  0.9 - 3.3 10e3/uL Final  . MONO# 11/09/2015 1.1* 0.1 - 0.9 10e3/uL Final  . Eosinophils Absolute 11/09/2015 0.1  0.0 - 0.5 10e3/uL Final  . Basophils Absolute 11/09/2015  0.0  0.0 - 0.1 10e3/uL Final  . NEUT% 11/09/2015 43.5  38.4 - 76.8 % Final  . LYMPH% 11/09/2015 37.6  14.0 - 49.7 % Final  . MONO% 11/09/2015 17.4* 0.0 - 14.0 % Final  . EOS% 11/09/2015 0.9  0.0 - 7.0 % Final  . BASO% 11/09/2015 0.6  0.0 - 2.0 % Final  . Sodium 11/09/2015 140  136 - 145 mEq/L Final  . Potassium 11/09/2015 2.5* 3.5 - 5.1 mEq/L Final  . Chloride 11/09/2015 91* 98 - 109 mEq/L Final    . CO2 11/09/2015 34* 22 - 29 mEq/L Final  . Glucose 11/09/2015 72  70 - 140 mg/dl Final   Glucose reference range is for nonfasting patients. Fasting glucose reference range is 70- 100.  Marland Kitchen BUN 11/09/2015 16.4  7.0 - 26.0 mg/dL Final  . Creatinine 11/09/2015 2.1* 0.6 - 1.1 mg/dL Final  . Total Bilirubin 11/09/2015 1.18  0.20 - 1.20 mg/dL Final  . Alkaline Phosphatase 11/09/2015 86  40 - 150 U/L Final  . AST 11/09/2015 17  5 - 34 U/L Final  . ALT 11/09/2015 23  0 - 55 U/L Final  . Total Protein 11/09/2015 8.0  6.4 - 8.3 g/dL Final  . Albumin 11/09/2015 3.7  3.5 - 5.0 g/dL Final  . Calcium 11/09/2015 9.5  8.4 - 10.4 mg/dL Final  . Anion Gap 11/09/2015 14* 3 - 11 mEq/L Final  . EGFR 11/09/2015 29* >90 ml/min/1.73 m2 Final   eGFR is calculated using the CKD-EPI Creatinine Equation (2009)  . Magnesium 11/09/2015 1.3* 1.5 - 2.5 mg/dl Final    RADIOGRAPHIC STUDIES: No results found.  ASSESSMENT/PLAN:    Multiple myeloma in relapse [facility]) Patient has been taking Pomalyst oral therapy as directed.  Patient states that she just finished her week off of the oral therapy.  She states that she will begin her Pomalyst oral therapy again today.   She continues with Zometa infusions every 3 months.   Patient is scheduled to return for labs, flush, visit, and in Zometa infusion on 11/23/2015.  Also, patient will return on Monday, 11/13/2015 for labs, flush, and a return symptom management clinic visit for follow-up.  Dehydration Patient states she has been suffering with some chronic nausea; but no vomiting for the past few days.  She reports minimal oral intake and feels dehydrated.  Patient will receive IV fluid rehydration; as well as Zofran IV while the cancer Center today.  Also refill patient's Zofran today.  Labs today revealed creatinine has increased to 2.1.  Most likely, elevated creatinine is secondary to dehydration.  Will continue to monitor closely.  Nausea without vomiting Patient  states she has been suffering with some chronic nausea; but no vomiting for the past few days.  She reports minimal oral intake and feels dehydrated.  Patient will receive IV fluid rehydration; as well as Zofran IV while the cancer Center today.  Also refill patient's Zofran today.  Labs today revealed creatinine has increased to 2.1.  Most likely, elevated creatinine is secondary to dehydration.  Will continue to monitor closely.  Hypokalemia Patient states that she has been experiencing some cramping specifically to her hands and sometimes to her generalized body.  Potassium is decreased to 2.5.  Patient confirmed that she continues to take potassium 10 mEq twice daily.  His previously directed.  Patient will receive 30 mEq of potassium IV; and 30 mEq of potassium orally while at the cancer Center today.  Also, patient was advised to  increase her potassium to 10 mEq 3 times per day until recheck.  Bronchitis Patient states she's been suffering with URI/cough symptoms for greater than one month.  She has seen her primary care physician for this in the past; and has completed both antibiotics and prednisone as directed.  She also underwent a chest x-ray on 10/31/2015 which revealed no acute findings.  Patient states that her cough continues to be dry and nonproductive.  Chest denies any recent fevers or chills.  Exam today reveals breath sounds clear bilaterally; with no cough or wheeze.  Also, no acute respiratory distress.  Advice patient to let us know if symptoms continue or worsen.  Hypomagnesemia Magnesium is down to 1.3.  Patient will receive 4 g mag sulfate IV today.  Most likely, secondary to patient's dehydration.    Patient stated understanding of all instructions; and was in agreement with this plan of care. The patient knows to call the clinic with any problems, questions or concerns.   Total time spent with patient was 25 minutes;  with greater than 75 percent of that time  spent in face to face counseling regarding patient's symptoms,  and coordination of care and follow up.  Disclaimer:This dictation was prepared with Dragon/digital dictation along with [employer]. Any transcriptional errors that result from this process are unintentional.  Drue Second, NP 11/11/2015

## 2015-11-11 NOTE — Assessment & Plan Note (Signed)
Patient states she has been suffering with some chronic nausea; but no vomiting for the past few days.  She reports minimal oral intake and feels dehydrated.  Patient will receive IV fluid rehydration; as well as Zofran IV while the cancer Center today.  Also refill patient's Zofran today.  Labs today revealed creatinine has increased to 2.1.  Most likely, elevated creatinine is secondary to dehydration.  Will continue to monitor closely.

## 2015-11-11 NOTE — Assessment & Plan Note (Signed)
Patient states she's been suffering with URI/cough symptoms for greater than one month.  She has seen her primary care physician for this in the past; and has completed both antibiotics and prednisone as directed.  She also underwent a chest x-ray on 10/31/2015 which revealed no acute findings.  Patient states that her cough continues to be dry and nonproductive.  Chest denies any recent fevers or chills.  Exam today reveals breath sounds clear bilaterally; with no cough or wheeze.  Also, no acute respiratory distress.  Advice patient to let us know if symptoms continue or worsen.

## 2015-11-11 NOTE — Assessment & Plan Note (Signed)
Patient has been taking Pomalyst oral therapy as directed.  Patient states that she just finished her week off of the oral therapy.  She states that she will begin her Pomalyst oral therapy again today.   She continues with Zometa infusions every 3 months.   Patient is scheduled to return for labs, flush, visit, and in Zometa infusion on 11/23/2015.  Also, patient will return on Monday, 11/13/2015 for labs, flush, and a return symptom management clinic visit for follow-up.

## 2015-11-11 NOTE — Assessment & Plan Note (Addendum)
Patient states that she has been experiencing some cramping specifically to her hands and sometimes to her generalized body.  Potassium is decreased to 2.5.  Patient confirmed that she continues to take potassium 10 mEq twice daily.  His previously directed.  Patient will receive 30 mEq of potassium IV; and 30 mEq of potassium orally while at the cancer Center today.  Also, patient was advised to increase her potassium to 10 mEq 3 times per day until recheck.

## 2015-11-11 NOTE — Assessment & Plan Note (Signed)
Magnesium is down to 1.3.  Patient will receive 4 g mag sulfate IV today.  Most likely, secondary to patient's dehydration.

## 2015-11-13 ENCOUNTER — Ambulatory Visit: Payer: Self-pay

## 2015-11-13 ENCOUNTER — Ambulatory Visit (HOSPITAL_BASED_OUTPATIENT_CLINIC_OR_DEPARTMENT_OTHER): Payer: Medicare Other

## 2015-11-13 ENCOUNTER — Ambulatory Visit (HOSPITAL_BASED_OUTPATIENT_CLINIC_OR_DEPARTMENT_OTHER): Payer: Medicare Other | Admitting: Nurse Practitioner

## 2015-11-13 ENCOUNTER — Telehealth: Payer: Self-pay | Admitting: Internal Medicine

## 2015-11-13 ENCOUNTER — Ambulatory Visit (HOSPITAL_COMMUNITY)
Admission: RE | Admit: 2015-11-13 | Discharge: 2015-11-13 | Disposition: A | Discharge status: Home / Self Care | Payer: Medicare Other | Source: Ambulatory Visit | Attending: Internal Medicine | Admitting: Internal Medicine

## 2015-11-13 ENCOUNTER — Encounter: Payer: Self-pay | Admitting: Nurse Practitioner

## 2015-11-13 ENCOUNTER — Encounter: Payer: Self-pay | Admitting: Internal Medicine

## 2015-11-13 ENCOUNTER — Other Ambulatory Visit (HOSPITAL_BASED_OUTPATIENT_CLINIC_OR_DEPARTMENT_OTHER): Payer: Medicare Other

## 2015-11-13 ENCOUNTER — Other Ambulatory Visit: Payer: Self-pay | Admitting: Nurse Practitioner

## 2015-11-13 VITALS — BP 137/74 | HR 85 | Temp 98.5°F | Resp 18 | Ht 65.0 in | Wt 208.7 lb

## 2015-11-13 DIAGNOSIS — E860 Dehydration: Secondary | ICD-10-CM

## 2015-11-13 DIAGNOSIS — Z452 Encounter for adjustment and management of vascular access device: Secondary | ICD-10-CM

## 2015-11-13 DIAGNOSIS — R110 Nausea: Secondary | ICD-10-CM

## 2015-11-13 DIAGNOSIS — C9002 Multiple myeloma in relapse: Secondary | ICD-10-CM | POA: Diagnosis not present

## 2015-11-13 DIAGNOSIS — E876 Hypokalemia: Secondary | ICD-10-CM

## 2015-11-13 DIAGNOSIS — Z95828 Presence of other vascular implants and grafts: Secondary | ICD-10-CM

## 2015-11-13 LAB — CBC WITH DIFFERENTIAL/PLATELET
BASO%: 0.5 % (ref 0.0–2.0)
BASOS ABS: 0 10*3/uL (ref 0.0–0.1)
EOS ABS: 0.2 10*3/uL (ref 0.0–0.5)
EOS%: 4.2 % (ref 0.0–7.0)
HEMATOCRIT: 31.1 % — AB (ref 34.8–46.6)
HEMOGLOBIN: 10.3 g/dL — AB (ref 11.6–15.9)
LYMPH#: 1.2 10*3/uL (ref 0.9–3.3)
LYMPH%: 32.6 % (ref 14.0–49.7)
MCH: 33.4 pg (ref 25.1–34.0)
MCHC: 33.1 g/dL (ref 31.5–36.0)
MCV: 101 fL (ref 79.5–101.0)
MONO#: 0.5 10*3/uL (ref 0.1–0.9)
MONO%: 13.7 % (ref 0.0–14.0)
NEUT#: 1.9 10*3/uL (ref 1.5–6.5)
NEUT%: 49 % (ref 38.4–76.8)
PLATELETS: 210 10*3/uL (ref 145–400)
RBC: 3.08 10*6/uL — ABNORMAL LOW (ref 3.70–5.45)
RDW: 15.5 % — AB (ref 11.2–14.5)
WBC: 3.8 10*3/uL — ABNORMAL LOW (ref 3.9–10.3)

## 2015-11-13 LAB — COMPREHENSIVE METABOLIC PANEL
ALT: 22 U/L (ref 0–55)
ANION GAP: 12 meq/L — AB (ref 3–11)
AST: 20 U/L (ref 5–34)
Albumin: 3.6 g/dL (ref 3.5–5.0)
Alkaline Phosphatase: 88 U/L (ref 40–150)
BUN: 21.7 mg/dL (ref 7.0–26.0)
CHLORIDE: 94 meq/L — AB (ref 98–109)
CO2: 32 meq/L — AB (ref 22–29)
CREATININE: 1.3 mg/dL — AB (ref 0.6–1.1)
Calcium: 8.8 mg/dL (ref 8.4–10.4)
EGFR: 50 mL/min/{1.73_m2} — ABNORMAL LOW (ref >90)
Glucose: 78 mg/dl (ref 70–140)
POTASSIUM: 3.2 meq/L — AB (ref 3.5–5.1)
Sodium: 138 mEq/L (ref 136–145)
Total Bilirubin: 0.56 mg/dL (ref 0.20–1.20)
Total Protein: 7.2 g/dL (ref 6.4–8.3)

## 2015-11-13 LAB — MAGNESIUM: Magnesium: 1.5 mg/dl (ref 1.5–2.5)

## 2015-11-13 MED ORDER — SODIUM CHLORIDE 0.9% FLUSH
10.0000 mL | INTRAVENOUS | Status: DC | PRN
  Administered 2015-11-13: 10 mL via INTRAVENOUS
  Filled 2015-11-13: qty 10

## 2015-11-13 MED ORDER — SODIUM CHLORIDE 0.9 % IV SOLN: 4.0000 g | Freq: Once | INTRAVENOUS | Status: DC

## 2015-11-13 MED ORDER — HEPARIN SOD (PORK) LOCK FLUSH 100 UNIT/ML IV SOLN
500.0000 [IU] | INTRAVENOUS | Status: AC | PRN
  Administered 2015-11-13: 500 [IU]
  Filled 2015-11-13: qty 5

## 2015-11-13 MED ORDER — SODIUM CHLORIDE 0.9 % IV SOLN: INTRAVENOUS | Status: DC

## 2015-11-13 MED ORDER — SODIUM CHLORIDE 0.9 % IV SOLN
4.0000 g | Freq: Once | INTRAVENOUS | Status: AC
  Administered 2015-11-13: 4 g via INTRAVENOUS
  Filled 2015-11-13: qty 8

## 2015-11-13 MED ORDER — SODIUM CHLORIDE 0.9% FLUSH
10.0000 mL | INTRAVENOUS | Status: AC | PRN
  Administered 2015-11-13: 10 mL

## 2015-11-13 MED ORDER — POTASSIUM CHLORIDE IN NACL 20-0.9 MEQ/L-% IV SOLN
INTRAVENOUS | Status: AC
  Administered 2015-11-13: 11:00:00 via INTRAVENOUS
  Filled 2015-11-13: qty 1000

## 2015-11-13 NOTE — Discharge Instructions (Signed)
Infusion of 20 meq of potasium in 1000 ml 0.9% normal saline and magnesium infusion     Hypomagnesemia Hypomagnesemia is a condition in which the level of magnesium in the blood is low. Magnesium is a mineral that is found in many foods. It is used in many different processes in the body. Hypomagnesemia can affect every organ in the body. It can cause life-threatening problems. CAUSES Causes of hypomagnesemia include:  Not getting enough magnesium in your diet.  Malnutrition.  Problems with absorbing magnesium from the intestines.  Dehydration.  Alcohol abuse.  Vomiting.  Severe diarrhea.  Some medicines, including medicines that make you urinate more.  Certain diseases, such as kidney disease, diabetes, and overactive thyroid. SIGNS AND SYMPTOMS  Involuntary shaking or trembling of a body part (tremor).  Confusion.  Muscle weakness.  Sensitivity to light, sound, and touch.  Psychiatric issues, such as depression, irritability, or psychosis.  Sudden tightening of muscles (muscle spasms).  Tingling in the arms and legs.  A feeling of fluttering of the heart. These symptoms are more severe if magnesium levels drop suddenly. DIAGNOSIS To make a diagnosis, your health care provider will do a physical exam and order blood and urine tests. TREATMENT Treatment will depend on the cause and the severity of your condition. It may involve:  A magnesium supplement. This can be taken in pill form. It can also be given through an IV tube. This is usually done if the condition is severe.  Changes to your diet. You may be directed to eat foods that have a lot of magnesium, such as green leafy vegetables, peas, beans, and nuts.  Eliminating alcohol from your diet. HOME CARE INSTRUCTIONS  Include foods with magnesium in your diet. Foods that are rich in magnesium include green vegetables, beans, nuts and seeds, and whole grains.  Take medicines only as directed by your  health care provider.  Take magnesium supplements if your health care provider instructs you to do that. Take them as directed.  Have your magnesium levels monitored as directed by your health care provider.  When you are active, drink fluids that contain electrolytes.  Keep all follow-up visits as directed by your health care provider. This is important. SEEK MEDICAL CARE IF:  You get worse instead of better.  Your symptoms return. SEEK IMMEDIATE MEDICAL CARE IF:  Your symptoms are severe.   This information is not intended to replace advice given to you by your health care provider. Make sure you discuss any questions you have with your health care provider.   Document Released: 03/13/2005 Document Revised: 07/08/2014 Document Reviewed: 01/31/2014 Elsevier Interactive Patient Education 2016 [employer]. Hypokalemia Hypokalemia means that the amount of potassium in the blood is lower than normal.Potassium is a chemical, called an electrolyte, that helps regulate the amount of fluid in the body. It also stimulates muscle contraction and helps nerves function properly.Most of the body's potassium is inside of cells, and only a very small amount is in the blood. Because the amount in the blood is so small, minor changes can be life-threatening. CAUSES  Antibiotics.  Diarrhea or vomiting.  Using laxatives too much, which can cause diarrhea.  Chronic kidney disease.  Water pills (diuretics).  Eating disorders (bulimia).  Low magnesium level.  Sweating a lot. SIGNS AND SYMPTOMS  Weakness.  Constipation.  Fatigue.  Muscle cramps.  Mental confusion.  Skipped heartbeats or irregular heartbeat (palpitations).  Tingling or numbness. DIAGNOSIS  Your health care provider can diagnose hypokalemia with  blood tests. In addition to checking your potassium level, your health care provider may also check other lab tests. TREATMENT Hypokalemia can be treated with potassium  supplements taken by mouth or adjustments in your current medicines. If your potassium level is very low, you may need to get potassium through a vein (IV) and be monitored in the hospital. A diet high in potassium is also helpful. Foods high in potassium are:  Nuts, such as peanuts and pistachios.  Seeds, such as sunflower seeds and pumpkin seeds.  Peas, lentils, and lima beans.  Whole grain and bran cereals and breads.  Fresh fruit and vegetables, such as apricots, avocado, bananas, cantaloupe, kiwi, oranges, tomatoes, asparagus, and potatoes.  Orange and tomato juices.  Red meats.  Fruit yogurt. HOME CARE INSTRUCTIONS  Take all medicines as prescribed by your health care provider.  Maintain a healthy diet by including nutritious food, such as fruits, vegetables, nuts, whole grains, and lean meats.  If you are taking a laxative, be sure to follow the directions on the label. SEEK MEDICAL CARE IF:  Your weakness gets worse.  You feel your heart pounding or racing.  You are vomiting or having diarrhea.  You are diabetic and having trouble keeping your blood glucose in the normal range. SEEK IMMEDIATE MEDICAL CARE IF:  You have chest pain, shortness of breath, or dizziness.  You are vomiting or having diarrhea for more than 2 days.  You faint. MAKE SURE YOU:   Understand these instructions.  Will watch your condition.  Will get help right away if you are not doing well or get worse.   This information is not intended to replace advice given to you by your health care provider. Make sure you discuss any questions you have with your health care provider.   Document Released: 06/17/2005 Document Revised: 07/08/2014 Document Reviewed: 12/18/2012 Elsevier Interactive Patient Education [employer].

## 2015-11-13 NOTE — Progress Notes (Signed)
SYMPTOM MANAGEMENT CLINIC    Chief Complaint: Nausea/vomiting, and dehydration  HPI:  [name] 65 y.o. female diagnosed with multiple myeloma.  Currently undergoing Pomalyst oral therapy and Zometa.  Patient in for recheck.  Patient states that her nausea and diarrhea have completely resolved.  She remains slightly dehydrated.  She is complaining of continued fatigue today.  She denies any recent fevers or chills.   No history exists.    Review of Systems  Constitutional: Positive for malaise/fatigue.  Gastrointestinal: Negative for nausea and vomiting.  All other systems reviewed and are negative.   Past Medical History  Diagnosis Date  . Hypercholesterolemia   . Compression fracture 04/08/2007    pathologic compression fracture  . Hypothyroidism   . FHx: chemotherapy     s/p 5 cycle revlimid/low dose decadron,s/p velcade,doxil,decadron,  . Hx of radiation therapy 05/05/07-05/18/07,& 03/05/11-03/21/11-    l3&l5 in 2008, t2-t6 03/2011  . GERD (gastroesophageal reflux disease)   . Insomnia     associated with steroids  . Constipation     takes oxycontin,vicodin  . Hx of radiation therapy 05/05/2007 to 05/18/2007    palliative, L3-5  . Hx of radiation therapy 03/05/2011 to 03/21/2011    palliative T2-T6, c-spine  . 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 PONV (postoperative nausea and vomiting)   . Multiple myeloma ([place]) dx'd 2009  . Metastasis to bone ([place])   . Family history of anesthesia complication     "daughter gets PONV too"  . Pneumonia     "several times"  . Stroke [facility]) 2014    denies residual on 01/27/2014    Past Surgical History  Procedure Laterality Date  . Knee arthroscopy Right     "put pin in"  . Knee arthroscopy Right     "took pin out and corrected what was wrong"  . Video bronchoscopy  07/30/2011    Procedure: VIDEO BRONCHOSCOPY WITHOUT FLUORO;  Surgeon: Kathee Delton, MD;  Location: [facility];  Service: Cardiopulmonary;  Laterality: Bilateral;  . Cholecystectomy  1980's  . Portacath placement Right 2009  . Vaginal hysterectomy  1980's    has Multiple myeloma in relapse ([place]); Hypothyroidism; GERD; Essential hypertension; History of autologous stem cell transplant ([place]); Hyperlipidemia; Medication management; Vitamin D deficiency; CKD stage 3 due to type 2 diabetes mellitus ([place]); Pain and swelling of left lower extremity; Encounter for antineoplastic chemotherapy; Lytic bone lesions on xray; Dehydration; Peripheral edema; Nausea without vomiting; Hypokalemia; Bronchitis; and Hypomagnesemia on her problem list.    has No Known Allergies.    Medication List       This list is accurate as of: 11/13/15 10:30 AM.  Always use your most recent med list.               ALPRAZolam 0.5 MG tablet  Commonly known as:  XANAX  TAKE 1/2 TO 1 TABLET 2 TO 3 TIMES DAILY AS NEEDED FOR ANXIETY     aspirin 81 MG tablet  Take 81 mg by mouth daily.     brompheniramine-pseudoephedrine-DM 30-2-10 MG/5ML syrup  Take 5 mLs by mouth 4 (four) times daily as needed.     cholestyramine 4 g packet  Commonly known as:  QUESTRAN  Take 1 packet by mouth 2 (two) times daily. Please take 2-3 hours after taking your regular medications     citalopram 40 MG tablet  Commonly known as:  CELEXA  Take 1  tablet (40 mg total) by mouth daily.     cyclobenzaprine 10 MG tablet  Commonly known as:  FLEXERIL  Take 1 tablet (10 mg total) by mouth 3 (three) times daily as needed for muscle spasms.     dexamethasone 4 MG tablet  Commonly known as:  DECADRON  TAKE (10) TABLETS BY MOUTH EVERY FRIDAY     DIPHENHIST 12.5 MG/5ML liquid  Generic drug:  diphenhydrAMINE     diphenoxylate-atropine 2.5-0.025 MG tablet  Commonly known as:  LOMOTIL  TAKE 2 TABLETS BY MOUTH 4 TIMES A DAY AS NEEDED FOR DIARRHEA     erythromycin ophthalmic ointment     Eszopiclone 3 MG Tabs  Take 1 tablet (3 mg total) by  mouth at bedtime as needed. for sleep     fexofenadine 60 MG tablet  Commonly known as:  ALLEGRA  Take 1 tablet daily as needed for allergies     furosemide 40 MG tablet  Commonly known as:  LASIX  TAKE 1 TABLET BY MOUTH TWICE A DAY FOR FLUID AND SWELLING     gabapentin 600 MG tablet  Commonly known as:  NEURONTIN  TAKE 1 TABLET 4 X DAILY AS NEEDED FOR PAIN OR CRAMPS     HYDROcodone-acetaminophen 7.5-325 MG tablet  Commonly known as:  NORCO  Take 1 tablet by mouth every 6 (six) hours as needed for moderate pain.     hyoscyamine 0.125 MG SL tablet  Commonly known as:  LEVSIN SL  Take 1 tablet (0.125 mg total) by mouth every 6 (six) hours as needed.     KLOR-CON 10 10 MEQ tablet  Generic drug:  potassium chloride  TAKE 1 TABLET BY MOUTH TWICE A DAY     levothyroxine 100 MCG tablet  Commonly known as:  SYNTHROID, LEVOTHROID  Take 100 mcg by mouth daily before breakfast.     loperamide 2 MG capsule  Commonly known as:  IMODIUM  Take 2 mg by mouth as needed for diarrhea or loose stools. Reported on 10/02/2015     Magnesium Hydroxide 400 MG Chew  Chew 2 tablets by mouth daily.     metolazone 2.5 MG tablet  Commonly known as:  ZAROXOLYN  Please take 1 tablet on Wednesday and Sunday in the morning for swelling.     mometasone 50 MCG/ACT nasal spray  Commonly known as:  NASONEX  Place 2 sprays into the nose daily.     montelukast 10 MG tablet  Commonly known as:  SINGULAIR  Take 1 tablet (10 mg total) by mouth daily.     morphine 15 MG 12 hr tablet  Commonly known as:  MS CONTIN  Take 1 tablet (15 mg total) by mouth every 12 (twelve) hours.     multivitamin with minerals Tabs tablet  Take 1 tablet by mouth every morning.     nystatin 100000 UNIT/ML suspension  Commonly known as:  MYCOSTATIN  Gargle 5 mL PO q6hours and spit out after 20-30 seconds.  Use for 1 week     ondansetron 8 MG disintegrating tablet  Commonly known as:  ZOFRAN-ODT  Take 1 tablet (8 mg total)  by mouth every 8 (eight) hours as needed for nausea or vomiting.     pantoprazole 40 MG tablet  Commonly known as:  PROTONIX  TAKE 1 TABLET (40 MG TOTAL) BY MOUTH 2 (TWO) TIMES DAILY.     pomalidomide 2 MG capsule  Commonly known as:  POMALYST  Take 1 capsule (2 mg total)  by mouth daily. Take with water on days 1-21. Repeat every 28 days. 11/07/15 auth number [id number] adult female not of childbearing potential. Faxed to [employer]     predniSONE 20 MG tablet  Commonly known as:  DELTASONE  3 tabs po daily x 3 days, then 2 tabs x 3 days, then 1.5 tabs x 3 days, then 1 tab x 3 days, then 0.5 tabs x 3 days     promethazine-dextromethorphan 6.25-15 MG/5ML syrup  Commonly known as:  PROMETHAZINE-DM  TAKE 5MLS BY MOUTH 4 TIMES A DAY AS NEEDED FOR COUGH         PHYSICAL EXAMINATION  Oncology Vitals 11/13/2015 11/09/2015  Height 165 cm 165 cm  Weight 94.666 kg 91.672 kg  Weight (lbs) 208 lbs 11 oz 202 lbs 2 oz  BMI (kg/m2) 34.73 kg/m2 33.63 kg/m2  Temp 98.5 98.2  Pulse 85 102  Resp 18 18  SpO2 100 99  BSA (m2) 2.08 m2 2.05 m2   BP Readings from Last 2 Encounters:  11/13/15 137/74  11/09/15 137/77    Physical Exam  Constitutional: She is oriented to person, place, and time and well-developed, well-nourished, and in no distress.  HENT:  Head: Normocephalic and atraumatic.  Mouth/Throat: Oropharynx is clear and moist.  Eyes: Conjunctivae and EOM are normal. Pupils are equal, round, and reactive to light. Right eye exhibits no discharge. Left eye exhibits no discharge. No scleral icterus.  Neck: Normal range of motion. Neck supple. No JVD present. No tracheal deviation present. No thyromegaly present.  Cardiovascular: Normal rate, regular rhythm, normal heart sounds and intact distal pulses.   Pulmonary/Chest: Effort normal and breath sounds normal. No respiratory distress. She has no wheezes. She has no rales. She exhibits no tenderness.  Abdominal: Soft. Bowel sounds are normal. She  exhibits no distension and no mass. There is no tenderness. There is no rebound and no guarding.  Musculoskeletal: Normal range of motion. She exhibits no edema or tenderness.  Lymphadenopathy:    She has no cervical adenopathy.  Neurological: She is alert and oriented to person, place, and time. Gait normal.  Skin: Skin is warm and dry. No rash noted. No erythema. No pallor.  Psychiatric: Affect normal.  Nursing note and vitals reviewed.   LABORATORY DATA:. Appointment on 11/13/2015  Component Date Value Ref Range Status  . WBC 11/13/2015 3.8* 3.9 - 10.3 10e3/uL Final  . NEUT# 11/13/2015 1.9  1.5 - 6.5 10e3/uL Final  . HGB 11/13/2015 10.3* 11.6 - 15.9 g/dL Final  . HCT 11/13/2015 31.1* 34.8 - 46.6 % Final  . Platelets 11/13/2015 210  145 - 400 10e3/uL Final  . MCV 11/13/2015 101.0  79.5 - 101.0 fL Final  . MCH 11/13/2015 33.4  25.1 - 34.0 pg Final  . MCHC 11/13/2015 33.1  31.5 - 36.0 g/dL Final  . RBC 11/13/2015 3.08* 3.70 - 5.45 10e6/uL Final  . RDW 11/13/2015 15.5* 11.2 - 14.5 % Final  . lymph# 11/13/2015 1.2  0.9 - 3.3 10e3/uL Final  . MONO# 11/13/2015 0.5  0.1 - 0.9 10e3/uL Final  . Eosinophils Absolute 11/13/2015 0.2  0.0 - 0.5 10e3/uL Final  . Basophils Absolute 11/13/2015 0.0  0.0 - 0.1 10e3/uL Final  . NEUT% 11/13/2015 49.0  38.4 - 76.8 % Final  . LYMPH% 11/13/2015 32.6  14.0 - 49.7 % Final  . MONO% 11/13/2015 13.7  0.0 - 14.0 % Final  . EOS% 11/13/2015 4.2  0.0 - 7.0 % Final  . BASO% 11/13/2015 0.5  0.0 - 2.0 % Final  . Sodium 11/13/2015 138  136 - 145 mEq/L Final  . Potassium 11/13/2015 3.2* 3.5 - 5.1 mEq/L Final  . Chloride 11/13/2015 94* 98 - 109 mEq/L Final  . CO2 11/13/2015 32* 22 - 29 mEq/L Final  . Glucose 11/13/2015 78  70 - 140 mg/dl Final   Glucose reference range is for nonfasting patients. Fasting glucose reference range is 70- 100.  Marland Kitchen BUN 11/13/2015 21.7  7.0 - 26.0 mg/dL Final  . Creatinine 11/13/2015 1.3* 0.6 - 1.1 mg/dL Final  . Total Bilirubin  11/13/2015 0.56  0.20 - 1.20 mg/dL Final  . Alkaline Phosphatase 11/13/2015 88  40 - 150 U/L Final  . AST 11/13/2015 20  5 - 34 U/L Final  . ALT 11/13/2015 22  0 - 55 U/L Final  . Total Protein 11/13/2015 7.2  6.4 - 8.3 g/dL Final  . Albumin 11/13/2015 3.6  3.5 - 5.0 g/dL Final  . Calcium 11/13/2015 8.8  8.4 - 10.4 mg/dL Final  . Anion Gap 11/13/2015 12* 3 - 11 mEq/L Final  . EGFR 11/13/2015 50* >90 ml/min/1.73 m2 Final   eGFR is calculated using the CKD-EPI Creatinine Equation (2009)  . Magnesium 11/13/2015 1.5  1.5 - 2.5 mg/dl Final    RADIOGRAPHIC STUDIES: No results found.  ASSESSMENT/PLAN:    Multiple myeloma in relapse [facility]) Patient has been taking Pomalyst oral therapy as directed.  Patient states that she just finished her week off of the oral therapy.  She states that she will begin her Pomalyst oral therapy again today.   She continues with Zometa infusions every 3 months.   Patient is scheduled to return for labs, flush, visit, and in Zometa infusion on 11/23/2015.  Also, pt will return on Thursday 11/16/15 for repeat labs and possible IV fluids as well.     Dehydration Patient states that her nausea has completely resolved.  She also reports she is no longer having any diarrhea.  She continues to feel mildly dehydrated, though.  She also complains of continued fatigue.  Creatinine has improved to 1.3.  Patient will receive additional IV fluid rehydration today.  She will return to the [facility] on Thursday, 11/16/2015 for repeat labs and possible IV fluid rehydration again.        Nausea without vomiting Patient states that her nausea has completely resolved.  However, she continues to feel slightly dehydrated; and will receive IV fluid rehydration was once again today.  Did confirm the patient has picked up her refill of Zofran.  Hypokalemia Potassium has improved to 3.2 today.  Patient confirmed that she has been taking the oral potassium 20 mEq 3 times  daily over this weekend.  Reviewed all findings with Dr. Julien Nordmann today; and discussed that patient is not start her new cycle of Pomalyst oral therapy until this past Friday, 11/10/2015.  Patient will receive an additional 20 mEq of potassium IV today.  She will also continue to take potassium 20 mEq orally 3 times per day until recheck on Thursday, 11/16/2015.    Hypomagnesemia Magnesium has improved to 1.5; but since magnesium is still at the low threshold of normal-will give patient magnesium 4 g IV while receiving IV fluids today.  Also, patient confirmed that she has been taking magnesium 500 mg daily on a regular basis for quite some time.    Patient stated understanding of all instructions; and was in agreement with this plan of care. The patient knows to call the  clinic with any problems, questions or concerns.   Total time spent with patient was 25 minutes;  with greater than 75 percent of that time spent in face to face counseling regarding patient's symptoms,  and coordination of care and follow up.  Disclaimer:This dictation was prepared with Dragon/digital dictation along with [employer]. Any transcriptional errors that result from this process are unintentional.  Drue Second, NP 11/13/2015

## 2015-11-13 NOTE — Assessment & Plan Note (Signed)
Magnesium has improved to 1.5; but since magnesium is still at the low threshold of normal-will give patient magnesium 4 g IV while receiving IV fluids today.  Also, patient confirmed that she has been taking magnesium 500 mg daily on a regular basis for quite some time.

## 2015-11-13 NOTE — Assessment & Plan Note (Signed)
Patient states that her nausea has completely resolved.  However, she continues to feel slightly dehydrated; and will receive IV fluid rehydration was once again today.  Did confirm the patient has picked up her refill of Zofran.

## 2015-11-13 NOTE — Assessment & Plan Note (Signed)
Patient has been taking Pomalyst oral therapy as directed.  Patient states that she just finished her week off of the oral therapy.  She states that she will begin her Pomalyst oral therapy again today.   She continues with Zometa infusions every 3 months.   Patient is scheduled to return for labs, flush, visit, and in Zometa infusion on 11/23/2015.  Also, pt will return on Thursday 11/16/15 for repeat labs and possible IV fluids as well.

## 2015-11-13 NOTE — Patient Instructions (Signed)

## 2015-11-13 NOTE — Progress Notes (Signed)
Cancer care policy form left in pod

## 2015-11-13 NOTE — Telephone Encounter (Signed)
s.w. pt and advised on appts....pt ok and aware °

## 2015-11-13 NOTE — Assessment & Plan Note (Signed)
Potassium has improved to 3.2 today.  Patient confirmed that she has been taking the oral potassium 20 mEq 3 times daily over this weekend.  Reviewed all findings with Dr. Julien Nordmann today; and discussed that patient is not start her new cycle of Pomalyst oral therapy until this past Friday, 11/10/2015.  Patient will receive an additional 20 mEq of potassium IV today.  She will also continue to take potassium 20 mEq orally 3 times per day until recheck on Thursday, 11/16/2015.

## 2015-11-13 NOTE — Assessment & Plan Note (Signed)
Patient states that her nausea has completely resolved.  She also reports she is no longer having any diarrhea.  She continues to feel mildly dehydrated, though.  She also complains of continued fatigue.  Creatinine has improved to 1.3.  Patient will receive additional IV fluid rehydration today.  She will return to the [place] on Thursday, 11/16/2015 for repeat labs and possible IV fluid rehydration again.

## 2015-11-13 NOTE — Progress Notes (Signed)
Patient ID: [name], female   DOB: [birth year], 65 y.o.   MRN: [id number] Authorizing Provider: Unk Pinto, MD  Associated Diagnosis: Hypokalemia - Primary     ICD-9-CM: 276.8 ICD-10-CM: E87.6    Hypomagnesemia     ICD-9-CM: 275.2 ICD-10-CM: E83.42      Infused 1000 ml Normal saline with 20 meq KCLat 500 ml per hour via port a cath. Port a cath already accessed on admission to the [facility]. Also infused magnesium sulfate 4 gms in 250 ml sodium chloride with  at 129 ml per hour via port a cath.  Patient tolerated procedure well. Went over discharge instructions and copy given to patient. Patient alert, oriented and ambulatory at discharge. Discharged to home with daughter.

## 2015-11-14 ENCOUNTER — Encounter: Payer: Self-pay | Admitting: Internal Medicine

## 2015-11-14 NOTE — Progress Notes (Signed)
left in pod- left for dr. Julien Nordmann to sign

## 2015-11-15 ENCOUNTER — Encounter: Payer: Self-pay | Admitting: Internal Medicine

## 2015-11-15 NOTE — Progress Notes (Signed)
Faxed [phone] and sent to patient and medical records

## 2015-11-16 ENCOUNTER — Other Ambulatory Visit (HOSPITAL_BASED_OUTPATIENT_CLINIC_OR_DEPARTMENT_OTHER): Payer: Medicare Other

## 2015-11-16 ENCOUNTER — Ambulatory Visit: Payer: Medicare Other

## 2015-11-16 ENCOUNTER — Ambulatory Visit (HOSPITAL_BASED_OUTPATIENT_CLINIC_OR_DEPARTMENT_OTHER): Payer: Medicare Other

## 2015-11-16 ENCOUNTER — Ambulatory Visit (HOSPITAL_BASED_OUTPATIENT_CLINIC_OR_DEPARTMENT_OTHER): Payer: Medicare Other | Admitting: Nurse Practitioner

## 2015-11-16 ENCOUNTER — Other Ambulatory Visit: Payer: Self-pay | Admitting: Nurse Practitioner

## 2015-11-16 VITALS — BP 128/72 | HR 82 | Temp 98.8°F | Resp 18 | Ht 65.0 in | Wt 206.6 lb

## 2015-11-16 DIAGNOSIS — R110 Nausea: Secondary | ICD-10-CM

## 2015-11-16 DIAGNOSIS — C9002 Multiple myeloma in relapse: Secondary | ICD-10-CM | POA: Diagnosis not present

## 2015-11-16 DIAGNOSIS — E876 Hypokalemia: Secondary | ICD-10-CM

## 2015-11-16 DIAGNOSIS — E860 Dehydration: Secondary | ICD-10-CM

## 2015-11-16 DIAGNOSIS — Z95828 Presence of other vascular implants and grafts: Secondary | ICD-10-CM | POA: Insufficient documentation

## 2015-11-16 LAB — CBC WITH DIFFERENTIAL/PLATELET
BASO%: 0.5 % (ref 0.0–2.0)
BASOS ABS: 0 10*3/uL (ref 0.0–0.1)
EOS ABS: 0.2 10*3/uL (ref 0.0–0.5)
EOS%: 3.8 % (ref 0.0–7.0)
HCT: 32.2 % — ABNORMAL LOW (ref 34.8–46.6)
HGB: 10.7 g/dL — ABNORMAL LOW (ref 11.6–15.9)
LYMPH%: 35.7 % (ref 14.0–49.7)
MCH: 33.4 pg (ref 25.1–34.0)
MCHC: 33.2 g/dL (ref 31.5–36.0)
MCV: 100.6 fL (ref 79.5–101.0)
MONO#: 0.3 10*3/uL (ref 0.1–0.9)
MONO%: 5.9 % (ref 0.0–14.0)
NEUT#: 2.4 10*3/uL (ref 1.5–6.5)
NEUT%: 54.1 % (ref 38.4–76.8)
Platelets: 222 10*3/uL (ref 145–400)
RBC: 3.2 10*6/uL — ABNORMAL LOW (ref 3.70–5.45)
RDW: 15.4 % — ABNORMAL HIGH (ref 11.2–14.5)
WBC: 4.4 10*3/uL (ref 3.9–10.3)
lymph#: 1.6 10*3/uL (ref 0.9–3.3)

## 2015-11-16 LAB — COMPREHENSIVE METABOLIC PANEL
ALK PHOS: 100 U/L (ref 40–150)
ALT: 21 U/L (ref 0–55)
AST: 16 U/L (ref 5–34)
Albumin: 3.4 g/dL — ABNORMAL LOW (ref 3.5–5.0)
Anion Gap: 10 mEq/L (ref 3–11)
BUN: 13.1 mg/dL (ref 7.0–26.0)
CALCIUM: 9.8 mg/dL (ref 8.4–10.4)
CHLORIDE: 96 meq/L — AB (ref 98–109)
CO2: 31 mEq/L — ABNORMAL HIGH (ref 22–29)
Creatinine: 1.1 mg/dL (ref 0.6–1.1)
EGFR: 60 mL/min/{1.73_m2} — AB (ref >90)
GLUCOSE: 97 mg/dL (ref 70–140)
POTASSIUM: 3.2 meq/L — AB (ref 3.5–5.1)
SODIUM: 137 meq/L (ref 136–145)
Total Bilirubin: 0.79 mg/dL (ref 0.20–1.20)
Total Protein: 7.2 g/dL (ref 6.4–8.3)

## 2015-11-16 LAB — MAGNESIUM

## 2015-11-16 MED ORDER — SODIUM CHLORIDE 0.9 % IV SOLN: INTRAVENOUS | Status: DC

## 2015-11-16 MED ORDER — HEPARIN SOD (PORK) LOCK FLUSH 100 UNIT/ML IV SOLN
500.0000 [IU] | Freq: Once | INTRAVENOUS | Status: AC
  Administered 2015-11-16: 500 [IU] via INTRAVENOUS
  Filled 2015-11-16: qty 5

## 2015-11-16 MED ORDER — SODIUM CHLORIDE 0.9 % IV SOLN
Freq: Once | INTRAVENOUS | Status: AC
  Administered 2015-11-16: 17:00:00 via INTRAVENOUS
  Filled 2015-11-16: qty 4

## 2015-11-16 MED ORDER — MAGNESIUM SULFATE 50 % IJ SOLN: 6.0000 g | Freq: Once | INTRAMUSCULAR | Status: DC

## 2015-11-16 MED ORDER — SODIUM CHLORIDE 0.9 % IJ SOLN
10.0000 mL | INTRAMUSCULAR | Status: DC | PRN
  Administered 2015-11-16: 10 mL via INTRAVENOUS
  Filled 2015-11-16: qty 10

## 2015-11-16 MED ORDER — POTASSIUM CHLORIDE CRYS ER 20 MEQ PO TBCR
30.0000 meq | EXTENDED_RELEASE_TABLET | Freq: Once | ORAL | Status: AC
  Administered 2015-11-16: 30 meq via ORAL
  Filled 2015-11-16: qty 1.5

## 2015-11-16 MED ORDER — SODIUM CHLORIDE 0.9 % IV SOLN
Freq: Once | INTRAVENOUS | Status: AC
  Administered 2015-11-16: 13:00:00 via INTRAVENOUS
  Filled 2015-11-16: qty 1000

## 2015-11-16 MED ORDER — SODIUM CHLORIDE 0.9 % IJ SOLN
3.0000 mL | Freq: Once | INTRAMUSCULAR | Status: DC | PRN
  Filled 2015-11-16: qty 10

## 2015-11-16 MED ORDER — SODIUM CHLORIDE 0.9% FLUSH
10.0000 mL | INTRAVENOUS | Status: DC | PRN
  Administered 2015-11-16: 10 mL via INTRAVENOUS
  Filled 2015-11-16: qty 10

## 2015-11-16 NOTE — Patient Instructions (Signed)

## 2015-11-17 ENCOUNTER — Other Ambulatory Visit: Payer: Self-pay | Admitting: Nurse Practitioner

## 2015-11-17 ENCOUNTER — Encounter: Payer: Self-pay | Admitting: Nurse Practitioner

## 2015-11-17 DIAGNOSIS — E876 Hypokalemia: Secondary | ICD-10-CM

## 2015-11-17 NOTE — Assessment & Plan Note (Signed)
Patient's magnesium level was 1.4 today.  Patient confirmed that she is taking magnesium 500 mg on a daily basis at home; has also received multiple magnesium infusions while at the cancer center.  Patient will receive magnesium 6 g IV while at the cancer Center today.  Will continue to monitor closely.

## 2015-11-17 NOTE — Assessment & Plan Note (Signed)
Patient states that  all of her nausea and vomiting have since resolved; and she no longer feels dehydrated.Marland Kitchen  However, patient will receive additional IV fluid rehydration with both her potassium and magnesium today.  We'll continue to monitor closely.

## 2015-11-17 NOTE — Assessment & Plan Note (Signed)
Patient has been taking Pomalyst oral therapy as directed.   She continues with Zometa infusions every 3 months.   Patient is scheduled to return for labs, flush, visit, and in Zometa infusion on 11/23/2015.  Also, will schedule patient for labs, flush, and a symptom management clinic visit on Monday afternoon, 11/20/2015 for follow-up.

## 2015-11-17 NOTE — Progress Notes (Signed)
SYMPTOM MANAGEMENT CLINIC    Chief Complaint: Nausea/vomiting, and dehydration  HPI:  [name] 65 y.o. female diagnosed with multiple myeloma.  Currently undergoing Pomalyst oral therapy and Zometa.  Patient in for recheck.  Patient states that her nausea and diarrhea have completely resolved. She is complaining of continued fatigue today.  She denies any recent fevers or chills.   No history exists.    Review of Systems  Constitutional: Positive for malaise/fatigue.  Gastrointestinal: Negative for nausea and vomiting.  All other systems reviewed and are negative.   Past Medical History  Diagnosis Date  . Hypercholesterolemia   . Compression fracture 04/08/2007    pathologic compression fracture  . Hypothyroidism   . FHx: chemotherapy     s/p 5 cycle revlimid/low dose decadron,s/p velcade,doxil,decadron,  . Hx of radiation therapy 05/05/07-05/18/07,& 03/05/11-03/21/11-    l3&l5 in 2008, t2-t6 03/2011  . GERD (gastroesophageal reflux disease)   . Insomnia     associated with steroids  . Constipation     takes oxycontin,vicodin  . Hx of radiation therapy 05/05/2007 to 05/18/2007    palliative, L3-5  . Hx of radiation therapy 03/05/2011 to 03/21/2011    palliative T2-T6, c-spine  . 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 PONV (postoperative nausea and vomiting)   . Multiple myeloma ([place]) dx'd 2009  . Metastasis to bone ([place])   . Family history of anesthesia complication     "daughter gets PONV too"  . Pneumonia     "several times"  . Stroke [facility]) 2014    denies residual on 01/27/2014    Past Surgical History  Procedure Laterality Date  . Knee arthroscopy Right     "put pin in"  . Knee arthroscopy Right     "took pin out and corrected what was wrong"  . Video bronchoscopy  07/30/2011    Procedure: VIDEO BRONCHOSCOPY WITHOUT FLUORO;  Surgeon: Kathee Delton, MD;  Location: [facility];  Service: Cardiopulmonary;   Laterality: Bilateral;  . Cholecystectomy  1980's  . Portacath placement Right 2009  . Vaginal hysterectomy  1980's    has Multiple myeloma in relapse ([place]); Hypothyroidism; GERD; Essential hypertension; History of autologous stem cell transplant ([place]); Hyperlipidemia; Medication management; Vitamin D deficiency; CKD stage 3 due to type 2 diabetes mellitus ([place]); Pain and swelling of left lower extremity; Encounter for antineoplastic chemotherapy; Lytic bone lesions on xray; Dehydration; Peripheral edema; Nausea without vomiting; Hypokalemia; Bronchitis; Hypomagnesemia; and Port catheter in place on her problem list.    has No Known Allergies.    Medication List       This list is accurate as of: 11/16/15 11:59 PM.  Always use your most recent med list.               ALPRAZolam 0.5 MG tablet  Commonly known as:  XANAX  TAKE 1/2 TO 1 TABLET 2 TO 3 TIMES DAILY AS NEEDED FOR ANXIETY     aspirin 81 MG tablet  Take 81 mg by mouth daily.     brompheniramine-pseudoephedrine-DM 30-2-10 MG/5ML syrup  Take 5 mLs by mouth 4 (four) times daily as needed.     cholestyramine 4 g packet  Commonly known as:  QUESTRAN  Take 1 packet by mouth 2 (two) times daily. Please take 2-3 hours after taking your regular medications     citalopram 40 MG tablet  Commonly known as:  CELEXA  Take 1 tablet (40  mg total) by mouth daily.     cyclobenzaprine 10 MG tablet  Commonly known as:  FLEXERIL  Take 1 tablet (10 mg total) by mouth 3 (three) times daily as needed for muscle spasms.     dexamethasone 4 MG tablet  Commonly known as:  DECADRON  TAKE (10) TABLETS BY MOUTH EVERY FRIDAY     DIPHENHIST 12.5 MG/5ML liquid  Generic drug:  diphenhydrAMINE     diphenoxylate-atropine 2.5-0.025 MG tablet  Commonly known as:  LOMOTIL  TAKE 2 TABLETS BY MOUTH 4 TIMES A DAY AS NEEDED FOR DIARRHEA     erythromycin ophthalmic ointment     Eszopiclone 3 MG Tabs  Take 1 tablet (3 mg total) by mouth at bedtime  as needed. for sleep     fexofenadine 60 MG tablet  Commonly known as:  ALLEGRA  Take 1 tablet daily as needed for allergies     furosemide 40 MG tablet  Commonly known as:  LASIX  TAKE 1 TABLET BY MOUTH TWICE A DAY FOR FLUID AND SWELLING     gabapentin 600 MG tablet  Commonly known as:  NEURONTIN  TAKE 1 TABLET 4 X DAILY AS NEEDED FOR PAIN OR CRAMPS     HYDROcodone-acetaminophen 7.5-325 MG tablet  Commonly known as:  NORCO  Take 1 tablet by mouth every 6 (six) hours as needed for moderate pain.     hyoscyamine 0.125 MG SL tablet  Commonly known as:  LEVSIN SL  Take 1 tablet (0.125 mg total) by mouth every 6 (six) hours as needed.     KLOR-CON 10 10 MEQ tablet  Generic drug:  potassium chloride  TAKE 1 TABLET BY MOUTH TWICE A DAY     levothyroxine 100 MCG tablet  Commonly known as:  SYNTHROID, LEVOTHROID  Take 100 mcg by mouth daily before breakfast.     loperamide 2 MG capsule  Commonly known as:  IMODIUM  Take 2 mg by mouth as needed for diarrhea or loose stools. Reported on 10/02/2015     Magnesium Hydroxide 400 MG Chew  Chew 2 tablets by mouth daily.     metolazone 2.5 MG tablet  Commonly known as:  ZAROXOLYN  Please take 1 tablet on Wednesday and Sunday in the morning for swelling.     mometasone 50 MCG/ACT nasal spray  Commonly known as:  NASONEX  Place 2 sprays into the nose daily.     montelukast 10 MG tablet  Commonly known as:  SINGULAIR  Take 1 tablet (10 mg total) by mouth daily.     morphine 15 MG 12 hr tablet  Commonly known as:  MS CONTIN  Take 1 tablet (15 mg total) by mouth every 12 (twelve) hours.     multivitamin with minerals Tabs tablet  Take 1 tablet by mouth every morning.     nystatin 100000 UNIT/ML suspension  Commonly known as:  MYCOSTATIN  Gargle 5 mL PO q6hours and spit out after 20-30 seconds.  Use for 1 week     ondansetron 8 MG disintegrating tablet  Commonly known as:  ZOFRAN-ODT  Take 1 tablet (8 mg total) by mouth every 8  (eight) hours as needed for nausea or vomiting.     pantoprazole 40 MG tablet  Commonly known as:  PROTONIX  TAKE 1 TABLET (40 MG TOTAL) BY MOUTH 2 (TWO) TIMES DAILY.     pomalidomide 2 MG capsule  Commonly known as:  POMALYST  Take 1 capsule (2 mg total) by mouth   daily. Take with water on days 1-21. Repeat every 28 days. 11/07/15 auth number [id number] adult female not of childbearing potential. Faxed to [employer]     predniSONE 20 MG tablet  Commonly known as:  DELTASONE  3 tabs po daily x 3 days, then 2 tabs x 3 days, then 1.5 tabs x 3 days, then 1 tab x 3 days, then 0.5 tabs x 3 days     promethazine-dextromethorphan 6.25-15 MG/5ML syrup  Commonly known as:  PROMETHAZINE-DM  TAKE 5MLS BY MOUTH 4 TIMES A DAY AS NEEDED FOR COUGH         PHYSICAL EXAMINATION  Oncology Vitals 11/16/2015 11/13/2015  Height 165 cm 165 cm  Weight 93.713 kg 94.666 kg  Weight (lbs) 206 lbs 10 oz 208 lbs 11 oz  BMI (kg/m2) 34.38 kg/m2 34.73 kg/m2  Temp 98.8 98.5  Pulse 82 85  Resp 18 18  SpO2 100 100  BSA (m2) 2.07 m2 2.08 m2   BP Readings from Last 2 Encounters:  11/16/15 128/72  11/13/15 137/74    Physical Exam  Constitutional: She is oriented to person, place, and time and well-developed, well-nourished, and in no distress.  HENT:  Head: Normocephalic and atraumatic.  Mouth/Throat: Oropharynx is clear and moist.  Eyes: Conjunctivae and EOM are normal. Pupils are equal, round, and reactive to light. Right eye exhibits no discharge. Left eye exhibits no discharge. No scleral icterus.  Neck: Normal range of motion. Neck supple. No JVD present. No tracheal deviation present. No thyromegaly present.  Cardiovascular: Normal rate, regular rhythm, normal heart sounds and intact distal pulses.   Pulmonary/Chest: Effort normal and breath sounds normal. No respiratory distress. She has no wheezes. She has no rales. She exhibits no tenderness.  Abdominal: Soft. Bowel sounds are normal. She exhibits no  distension and no mass. There is no tenderness. There is no rebound and no guarding.  Musculoskeletal: Normal range of motion. She exhibits no edema or tenderness.  Lymphadenopathy:    She has no cervical adenopathy.  Neurological: She is alert and oriented to person, place, and time. Gait normal.  Skin: Skin is warm and dry. No rash noted. No erythema. No pallor.  Psychiatric: Affect normal.  Nursing note and vitals reviewed.   LABORATORY DATA:. Appointment on 11/16/2015  Component Date Value Ref Range Status  . WBC 11/16/2015 4.4  3.9 - 10.3 10e3/uL Final  . NEUT# 11/16/2015 2.4  1.5 - 6.5 10e3/uL Final  . HGB 11/16/2015 10.7* 11.6 - 15.9 g/dL Final  . HCT 11/16/2015 32.2* 34.8 - 46.6 % Final  . Platelets 11/16/2015 222  145 - 400 10e3/uL Final  . MCV 11/16/2015 100.6  79.5 - 101.0 fL Final  . MCH 11/16/2015 33.4  25.1 - 34.0 pg Final  . MCHC 11/16/2015 33.2  31.5 - 36.0 g/dL Final  . RBC 11/16/2015 3.20* 3.70 - 5.45 10e6/uL Final  . RDW 11/16/2015 15.4* 11.2 - 14.5 % Final  . lymph# 11/16/2015 1.6  0.9 - 3.3 10e3/uL Final  . MONO# 11/16/2015 0.3  0.1 - 0.9 10e3/uL Final  . Eosinophils Absolute 11/16/2015 0.2  0.0 - 0.5 10e3/uL Final  . Basophils Absolute 11/16/2015 0.0  0.0 - 0.1 10e3/uL Final  . NEUT% 11/16/2015 54.1  38.4 - 76.8 % Final  . LYMPH% 11/16/2015 35.7  14.0 - 49.7 % Final  . MONO% 11/16/2015 5.9  0.0 - 14.0 % Final  . EOS% 11/16/2015 3.8  0.0 - 7.0 % Final  . BASO% 11/16/2015 0.5  0.0 - 2.0 % Final  . Sodium 11/16/2015 137  136 - 145 mEq/L Final  . Potassium 11/16/2015 3.2* 3.5 - 5.1 mEq/L Final  . Chloride 11/16/2015 96* 98 - 109 mEq/L Final  . CO2 11/16/2015 31* 22 - 29 mEq/L Final  . Glucose 11/16/2015 97  70 - 140 mg/dl Final   Glucose reference range is for nonfasting patients. Fasting glucose reference range is 70- 100.  . BUN 11/16/2015 13.1  7.0 - 26.0 mg/dL Final  . Creatinine 11/16/2015 1.1  0.6 - 1.1 mg/dL Final  . Total Bilirubin 11/16/2015 0.79   0.20 - 1.20 mg/dL Final  . Alkaline Phosphatase 11/16/2015 100  40 - 150 U/L Final  . AST 11/16/2015 16  5 - 34 U/L Final  . ALT 11/16/2015 21  0 - 55 U/L Final  . Total Protein 11/16/2015 7.2  6.4 - 8.3 g/dL Final  . Albumin 11/16/2015 3.4* 3.5 - 5.0 g/dL Final  . Calcium 11/16/2015 9.8  8.4 - 10.4 mg/dL Final  . Anion Gap 11/16/2015 10  3 - 11 mEq/L Final  . EGFR 11/16/2015 60* >90 ml/min/1.73 m2 Final   eGFR is calculated using the CKD-EPI Creatinine Equation (2009)  . Magnesium 11/16/2015 1.4 Repeated and Verified* 1.5 - 2.5 mg/dl Final    RADIOGRAPHIC STUDIES: No results found.  ASSESSMENT/PLAN:    Multiple myeloma in relapse ([facility]) Patient has been taking Pomalyst oral therapy as directed.   She continues with Zometa infusions every 3 months.   Patient is scheduled to return for labs, flush, visit, and in Zometa infusion on 11/23/2015.  Also, will schedule patient for labs, flush, and a symptom management clinic visit on Monday afternoon, 11/20/2015 for follow-up.      Dehydration Patient states that  all of her nausea and vomiting have since resolved; and she no longer feels dehydrated..  However, patient will receive additional IV fluid rehydration with both her potassium and magnesium today.  We'll continue to monitor closely.  Nausea without vomiting Patient states that  all of her nausea and vomiting have since resolved; and she no longer feels dehydrated..  However, patient will receive additional IV fluid rehydration with both her potassium and magnesium today.  We'll continue to monitor closely.  Hypokalemia Potassium remains low at 3.2.  Patient currently she is taking potassium 6020 mEq 3 times per day as directed.  Patient is also been receiving intermittent potassium infusions while at the cancer Center within this past week as well.  Patient received potassium 30 mEq IV and potassium 30 mEq orally while at the cancer Center today.  Also, confirmed the  patient has 30 taking potassium 20 mEq earlier today.  She was advised to hold any further potassium at home for today-since she received a total of 60 mEq potassium while at the cancer center.  Careful review of patient's medication list; and it was noted that patient is taking Lasix 80 mg every morning and 40 mg every evening.  Also, patient has taken Zaroxolyn every Wednesday and Sunday for peripheral edema as well.  Since both of these medications can deplete patient's potassium-patient was advised to try holding the Zaroxolyn altogether; and to decrease the Lasix to 40 mg every morning and 40 mg every evening.  Patient was advised to reinstitute the Lasix per her old dosing instructions and the Zaroxolyn if she develops any shortness of breath or increased peripheral edema over the weekend.  Also, patient's last crit directly to the emergency department for   any worsening symptoms whatsoever.  Patient will return to the cancer Center on Monday, 11/20/2015 for close follow-up.  Hypomagnesemia Patient's magnesium level was 1.4 today.  Patient confirmed that she is taking magnesium 500 mg on a daily basis at home; has also received multiple magnesium infusions while at the cancer center.  Patient will receive magnesium 6 g IV while at the cancer Center today.  Will continue to monitor closely.    Patient stated understanding of all instructions; and was in agreement with this plan of care. The patient knows to call the clinic with any problems, questions or concerns.   Total time spent with patient was 25 minutes;  with greater than 75 percent of that time spent in face to face counseling regarding patient's symptoms,  and coordination of care and follow up.  Disclaimer:This dictation was prepared with Dragon/digital dictation along with Smartphrase technology. Any transcriptional errors that result from this process are unintentional.  Bacon, Cynthia, NP 11/17/2015    

## 2015-11-17 NOTE — Assessment & Plan Note (Addendum)
Potassium remains low at 3.2.  Patient currently she is taking potassium 6020 mEq 3 times per day as directed.  Patient is also been receiving intermittent potassium infusions while at the [facility] within this past week as well.  Patient received potassium 30 mEq IV and potassium 30 mEq orally while at the cancer Center today.  Also, confirmed the patient has 30 taking potassium 20 mEq earlier today.  She was advised to hold any further potassium at home for today-since she received a total of 60 mEq potassium while at the cancer center.  Careful review of patient's medication list; and it was noted that patient is taking Lasix 80 mg every morning and 40 mg every evening.  Also, patient has taken Zaroxolyn every Wednesday and Sunday for peripheral edema as well.  Since both of these medications can deplete patient's potassium-patient was advised to try holding the Zaroxolyn altogether; and to decrease the Lasix to 40 mg every morning and 40 mg every evening.  Patient was advised to reinstitute the Lasix per her old dosing instructions and the Zaroxolyn if she develops any shortness of breath or increased peripheral edema over the weekend.  Also, patient's last crit directly to the emergency department for any worsening symptoms whatsoever.  Patient will return to the [facility] on Monday, 11/20/2015 for close follow-up.

## 2015-11-20 ENCOUNTER — Other Ambulatory Visit: Payer: Self-pay | Admitting: Internal Medicine

## 2015-11-20 ENCOUNTER — Telehealth: Payer: Self-pay | Admitting: Nurse Practitioner

## 2015-11-20 ENCOUNTER — Encounter: Payer: Self-pay | Admitting: Nurse Practitioner

## 2015-11-20 ENCOUNTER — Telehealth: Payer: Self-pay | Admitting: *Deleted

## 2015-11-20 ENCOUNTER — Other Ambulatory Visit: Payer: Self-pay

## 2015-11-20 DIAGNOSIS — S32001A Stable burst fracture of unspecified lumbar vertebra, initial encounter for closed fracture: Secondary | ICD-10-CM | POA: Diagnosis not present

## 2015-11-20 DIAGNOSIS — M549 Dorsalgia, unspecified: Secondary | ICD-10-CM | POA: Diagnosis not present

## 2015-11-20 DIAGNOSIS — M4802 Spinal stenosis, cervical region: Secondary | ICD-10-CM | POA: Diagnosis not present

## 2015-11-20 DIAGNOSIS — M542 Cervicalgia: Secondary | ICD-10-CM | POA: Diagnosis not present

## 2015-11-20 DIAGNOSIS — E876 Hypokalemia: Secondary | ICD-10-CM

## 2015-11-20 NOTE — Telephone Encounter (Signed)
cld & spoke topt and adv of appt this am for lab/flush/MD-pt stated didnt know anything about appt-she has another appt this am-cx appt

## 2015-11-20 NOTE — Telephone Encounter (Signed)
LM for rtn call- pt needs to reschedule Lab and [facility] appt cancelled on 11/20/15. Entered POF for scheduling to also follow up with pt.

## 2015-11-20 NOTE — Telephone Encounter (Signed)
Spoke to pt- needs to follow up with [facility] earlier than 5/25 appt. Pt agreed to appt for lab at 10am and [facility] at 10.45 POF sent and labs ordered.

## 2015-11-21 ENCOUNTER — Ambulatory Visit (HOSPITAL_BASED_OUTPATIENT_CLINIC_OR_DEPARTMENT_OTHER): Payer: Medicare Other | Admitting: Nurse Practitioner

## 2015-11-21 ENCOUNTER — Other Ambulatory Visit (HOSPITAL_BASED_OUTPATIENT_CLINIC_OR_DEPARTMENT_OTHER): Payer: Medicare Other

## 2015-11-21 ENCOUNTER — Telehealth: Payer: Self-pay | Admitting: Internal Medicine

## 2015-11-21 VITALS — BP 132/75 | HR 82 | Temp 98.4°F | Resp 18 | Ht 65.0 in | Wt 210.1 lb

## 2015-11-21 DIAGNOSIS — C9002 Multiple myeloma in relapse: Secondary | ICD-10-CM

## 2015-11-21 DIAGNOSIS — R197 Diarrhea, unspecified: Secondary | ICD-10-CM

## 2015-11-21 DIAGNOSIS — E860 Dehydration: Secondary | ICD-10-CM | POA: Diagnosis not present

## 2015-11-21 DIAGNOSIS — E876 Hypokalemia: Secondary | ICD-10-CM | POA: Diagnosis not present

## 2015-11-21 DIAGNOSIS — R110 Nausea: Secondary | ICD-10-CM

## 2015-11-21 LAB — CBC WITH DIFFERENTIAL/PLATELET
BASO%: 0.4 % (ref 0.0–2.0)
Basophils Absolute: 0 10*3/uL (ref 0.0–0.1)
EOS%: 3.7 % (ref 0.0–7.0)
Eosinophils Absolute: 0.2 10*3/uL (ref 0.0–0.5)
HCT: 33.1 % — ABNORMAL LOW (ref 34.8–46.6)
HGB: 10.6 g/dL — ABNORMAL LOW (ref 11.6–15.9)
LYMPH%: 33.1 % (ref 14.0–49.7)
MCH: 33.6 pg (ref 25.1–34.0)
MCHC: 32.1 g/dL (ref 31.5–36.0)
MCV: 104.7 fL — ABNORMAL HIGH (ref 79.5–101.0)
MONO#: 0.5 10*3/uL (ref 0.1–0.9)
MONO%: 9.3 % (ref 0.0–14.0)
NEUT%: 53.5 % (ref 38.4–76.8)
NEUTROS ABS: 3.1 10*3/uL (ref 1.5–6.5)
Platelets: 229 10*3/uL (ref 145–400)
RBC: 3.16 10*6/uL — ABNORMAL LOW (ref 3.70–5.45)
RDW: 17.3 % — ABNORMAL HIGH (ref 11.2–14.5)
WBC: 5.9 10*3/uL (ref 3.9–10.3)
lymph#: 1.9 10*3/uL (ref 0.9–3.3)

## 2015-11-21 LAB — COMPREHENSIVE METABOLIC PANEL
ALT: 17 U/L (ref 0–55)
ANION GAP: 7 meq/L (ref 3–11)
AST: 10 U/L (ref 5–34)
Albumin: 3.1 g/dL — ABNORMAL LOW (ref 3.5–5.0)
Alkaline Phosphatase: 69 U/L (ref 40–150)
BILIRUBIN TOTAL: 0.67 mg/dL (ref 0.20–1.20)
BUN: 8.8 mg/dL (ref 7.0–26.0)
CHLORIDE: 109 meq/L (ref 98–109)
CO2: 25 meq/L (ref 22–29)
CREATININE: 0.8 mg/dL (ref 0.6–1.1)
Calcium: 8.8 mg/dL (ref 8.4–10.4)
EGFR: 85 mL/min/{1.73_m2} — ABNORMAL LOW (ref >90)
GLUCOSE: 105 mg/dL (ref 70–140)
Potassium: 3.7 mEq/L (ref 3.5–5.1)
SODIUM: 141 meq/L (ref 136–145)
TOTAL PROTEIN: 6.5 g/dL (ref 6.4–8.3)

## 2015-11-21 LAB — MAGNESIUM: Magnesium: 1.3 mg/dl — CL (ref 1.5–2.5)

## 2015-11-21 NOTE — Telephone Encounter (Signed)
left msg confirming 6/1 added apt

## 2015-11-22 ENCOUNTER — Encounter: Payer: Self-pay | Admitting: Nurse Practitioner

## 2015-11-22 ENCOUNTER — Other Ambulatory Visit: Payer: Self-pay | Admitting: Nurse Practitioner

## 2015-11-22 DIAGNOSIS — R197 Diarrhea, unspecified: Secondary | ICD-10-CM | POA: Insufficient documentation

## 2015-11-22 DIAGNOSIS — E876 Hypokalemia: Secondary | ICD-10-CM

## 2015-11-22 NOTE — Assessment & Plan Note (Signed)
Patient states that she has had some intermittent, mild diarrhea; but takes Imodium as directed.

## 2015-11-22 NOTE — Assessment & Plan Note (Signed)
Patient states that all of her nausea/vomiting has completely resolved and she no longer feels dehydrated.

## 2015-11-22 NOTE — Assessment & Plan Note (Signed)
Patient has been taking Pomalyst oral therapy as directed.   She continues with Zometa infusions every 3 months.   Patient is scheduled to return for labs, flush, visit, and in Zometa infusion on 11/30/2015.

## 2015-11-22 NOTE — Assessment & Plan Note (Signed)
Patient has been taking 20 mEq of potassium.  3 times per day as directed.  She has also decreased her Lasix down to 80 mg once daily; and completely discontinued the Zaroxolyn diuretic as well.  Patient's potassium today has increased to 3.7.  Patient was advised to continue taking the oral potassium at home as directed.  Also, called and spoke to [employer], [employer] (pt's PCP) to review decreased diuretics.  Patient's primary care physician was in complete agreement with the plan to decrease the diuretics.  Patient will return on Friday, 11/24/2015 for repeat labs.  May need to consider decreasing the potassium dose at that time if potassium remains within normal limits.

## 2015-11-22 NOTE — Progress Notes (Signed)
SYMPTOM MANAGEMENT CLINIC    Chief Complaint: Nausea/vomiting, and dehydration  HPI:  [name] 65 y.o. female diagnosed with multiple myeloma.  Currently undergoing Pomalyst oral therapy and Zometa.  Patient in for recheck.  Patient states that her nausea and diarrhea have completely resolved. She is complaining of continued fatigue today.  She denies any recent fevers or chills.   No history exists.    Review of Systems  Constitutional: Positive for malaise/fatigue.  All other systems reviewed and are negative.   Past Medical History  Diagnosis Date  . Hypercholesterolemia   . Compression fracture 04/08/2007    pathologic compression fracture  . Hypothyroidism   . FHx: chemotherapy     s/p 5 cycle revlimid/low dose decadron,s/p velcade,doxil,decadron,  . Hx of radiation therapy 05/05/07-05/18/07,& 03/05/11-03/21/11-    l3&l5 in 2008, t2-t6 03/2011  . GERD (gastroesophageal reflux disease)   . Insomnia     associated with steroids  . Constipation     takes oxycontin,vicodin  . Hx of radiation therapy 05/05/2007 to 05/18/2007    palliative, L3-5  . Hx of radiation therapy 03/05/2011 to 03/21/2011    palliative T2-T6, c-spine  . 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 PONV (postoperative nausea and vomiting)   . Multiple myeloma ([place]) dx'd 2009  . Metastasis to bone ([place])   . Family history of anesthesia complication     "daughter gets PONV too"  . Pneumonia     "several times"  . Stroke [facility]) 2014    denies residual on 01/27/2014    Past Surgical History  Procedure Laterality Date  . Knee arthroscopy Right     "put pin in"  . Knee arthroscopy Right     "took pin out and corrected what was wrong"  . Video bronchoscopy  07/30/2011    Procedure: VIDEO BRONCHOSCOPY WITHOUT FLUORO;  Surgeon: Kathee Delton, MD;  Location: [facility];  Service: Cardiopulmonary;  Laterality: Bilateral;  . Cholecystectomy  1980's  .  Portacath placement Right 2009  . Vaginal hysterectomy  1980's    has Multiple myeloma in relapse ([place]); Hypothyroidism; GERD; Essential hypertension; History of autologous stem cell transplant ([place]); Hyperlipidemia; Vitamin D deficiency; CKD stage 3 due to type 2 diabetes mellitus ([place]); Pain and swelling of left lower extremity; Encounter for antineoplastic chemotherapy; Lytic bone lesions on xray; Dehydration; Peripheral edema; Nausea without vomiting; Hypokalemia; Bronchitis; Hypomagnesemia; and Diarrhea on her problem list.    has No Known Allergies.    Medication List       This list is accurate as of: 11/21/15 11:59 PM.  Always use your most recent med list.               ALPRAZolam 0.5 MG tablet  Commonly known as:  XANAX  TAKE 1/2 TO 1 TABLET 2 TO 3 TIMES DAILY AS NEEDED FOR ANXIETY     aspirin 81 MG tablet  Take 81 mg by mouth daily.     brompheniramine-pseudoephedrine-DM 30-2-10 MG/5ML syrup  Take 5 mLs by mouth 4 (four) times daily as needed.     cholestyramine 4 g packet  Commonly known as:  QUESTRAN  Take 1 packet by mouth 2 (two) times daily. Please take 2-3 hours after taking your regular medications     citalopram 40 MG tablet  Commonly known as:  CELEXA  Take 1 tablet (40 mg total) by mouth daily.     cyclobenzaprine 10 MG  tablet  Commonly known as:  FLEXERIL  Take 1 tablet (10 mg total) by mouth 3 (three) times daily as needed for muscle spasms.     dexamethasone 4 MG tablet  Commonly known as:  DECADRON  TAKE (10) TABLETS BY MOUTH EVERY FRIDAY     DIPHENHIST 12.5 MG/5ML liquid  Generic drug:  diphenhydrAMINE     diphenoxylate-atropine 2.5-0.025 MG tablet  Commonly known as:  LOMOTIL  TAKE 2 TABLETS BY MOUTH 4 TIMES A DAY AS NEEDED FOR DIARRHEA     erythromycin ophthalmic ointment     Eszopiclone 3 MG Tabs  Take 1 tablet (3 mg total) by mouth at bedtime as needed. for sleep     fexofenadine 60 MG tablet  Commonly known as:  ALLEGRA  Take 1  tablet daily as needed for allergies     furosemide 40 MG tablet  Commonly known as:  LASIX  TAKE 1 TABLET BY MOUTH TWICE A DAY FOR FLUID AND SWELLING     gabapentin 600 MG tablet  Commonly known as:  NEURONTIN  TAKE 1 TABLET 4 X DAILY AS NEEDED FOR PAIN OR CRAMPS     HYDROcodone-acetaminophen 7.5-325 MG tablet  Commonly known as:  NORCO  Take 1 tablet by mouth every 6 (six) hours as needed for moderate pain.     hyoscyamine 0.125 MG SL tablet  Commonly known as:  LEVSIN SL  Take 1 tablet (0.125 mg total) by mouth every 6 (six) hours as needed.     KLOR-CON 10 10 MEQ tablet  Generic drug:  potassium chloride  TAKE 1 TABLET BY MOUTH TWICE A DAY     levothyroxine 100 MCG tablet  Commonly known as:  SYNTHROID, LEVOTHROID  Take 100 mcg by mouth daily before breakfast.     loperamide 2 MG capsule  Commonly known as:  IMODIUM  Take 2 mg by mouth as needed for diarrhea or loose stools. Reported on 10/02/2015     Magnesium Hydroxide 400 MG Chew  Chew 2 tablets by mouth daily.     metolazone 2.5 MG tablet  Commonly known as:  ZAROXOLYN  Please take 1 tablet on Wednesday and Sunday in the morning for swelling.     mometasone 50 MCG/ACT nasal spray  Commonly known as:  NASONEX  Place 2 sprays into the nose daily.     montelukast 10 MG tablet  Commonly known as:  SINGULAIR  TAKE 1 TABLET BY MOUTH EVERY DAY     morphine 15 MG 12 hr tablet  Commonly known as:  MS CONTIN  Take 1 tablet (15 mg total) by mouth every 12 (twelve) hours.     multivitamin with minerals Tabs tablet  Take 1 tablet by mouth every morning.     nystatin 100000 UNIT/ML suspension  Commonly known as:  MYCOSTATIN  Gargle 5 mL PO q6hours and spit out after 20-30 seconds.  Use for 1 week     ondansetron 8 MG disintegrating tablet  Commonly known as:  ZOFRAN-ODT  Take 1 tablet (8 mg total) by mouth every 8 (eight) hours as needed for nausea or vomiting.     pantoprazole 40 MG tablet  Commonly known as:   PROTONIX  TAKE 1 TABLET (40 MG TOTAL) BY MOUTH 2 (TWO) TIMES DAILY.     pomalidomide 2 MG capsule  Commonly known as:  POMALYST  Take 1 capsule (2 mg total) by mouth daily. Take with water on days 1-21. Repeat every 28 days. 11/07/15 auth number  [id number] adult female not of childbearing potential. Faxed to [employer]     predniSONE 20 MG tablet  Commonly known as:  DELTASONE  3 tabs po daily x 3 days, then 2 tabs x 3 days, then 1.5 tabs x 3 days, then 1 tab x 3 days, then 0.5 tabs x 3 days     promethazine-dextromethorphan 6.25-15 MG/5ML syrup  Commonly known as:  PROMETHAZINE-DM  TAKE 5MLS BY MOUTH 4 TIMES A DAY AS NEEDED FOR COUGH         PHYSICAL EXAMINATION  Oncology Vitals 11/21/2015 11/16/2015  Height 165 cm 165 cm  Weight 95.301 kg 93.713 kg  Weight (lbs) 210 lbs 2 oz 206 lbs 10 oz  BMI (kg/m2) 34.96 kg/m2 34.38 kg/m2  Temp 98.4 98.8  Pulse 82 82  Resp 18 18  SpO2 100 100  BSA (m2) 2.09 m2 2.07 m2   BP Readings from Last 2 Encounters:  11/21/15 132/75  11/16/15 128/72    Physical Exam  Constitutional: She is oriented to person, place, and time and well-developed, well-nourished, and in no distress.  HENT:  Head: Normocephalic and atraumatic.  Mouth/Throat: Oropharynx is clear and moist.  Eyes: Conjunctivae and EOM are normal. Pupils are equal, round, and reactive to light. Right eye exhibits no discharge. Left eye exhibits no discharge. No scleral icterus.  Neck: Normal range of motion. Neck supple. No JVD present. No tracheal deviation present. No thyromegaly present.  Cardiovascular: Normal rate, regular rhythm, normal heart sounds and intact distal pulses.   Pulmonary/Chest: Effort normal and breath sounds normal. No respiratory distress. She has no wheezes. She has no rales. She exhibits no tenderness.  Abdominal: Soft. Bowel sounds are normal. She exhibits no distension and no mass. There is no tenderness. There is no rebound and no guarding.  Musculoskeletal:  Normal range of motion. She exhibits no edema or tenderness.  Lymphadenopathy:    She has no cervical adenopathy.  Neurological: She is alert and oriented to person, place, and time. Gait normal.  Skin: Skin is warm and dry. No rash noted. No erythema. No pallor.  Psychiatric: Affect normal.  Nursing note and vitals reviewed.   LABORATORY DATA:. Appointment on 11/21/2015  Component Date Value Ref Range Status  . WBC 11/21/2015 5.9  3.9 - 10.3 10e3/uL Final  . NEUT# 11/21/2015 3.1  1.5 - 6.5 10e3/uL Final  . HGB 11/21/2015 10.6* 11.6 - 15.9 g/dL Final  . HCT 11/21/2015 33.1* 34.8 - 46.6 % Final  . Platelets 11/21/2015 229  145 - 400 10e3/uL Final  . MCV 11/21/2015 104.7* 79.5 - 101.0 fL Final  . MCH 11/21/2015 33.6  25.1 - 34.0 pg Final  . MCHC 11/21/2015 32.1  31.5 - 36.0 g/dL Final  . RBC 11/21/2015 3.16* 3.70 - 5.45 10e6/uL Final  . RDW 11/21/2015 17.3* 11.2 - 14.5 % Final  . lymph# 11/21/2015 1.9  0.9 - 3.3 10e3/uL Final  . MONO# 11/21/2015 0.5  0.1 - 0.9 10e3/uL Final  . Eosinophils Absolute 11/21/2015 0.2  0.0 - 0.5 10e3/uL Final  . Basophils Absolute 11/21/2015 0.0  0.0 - 0.1 10e3/uL Final  . NEUT% 11/21/2015 53.5  38.4 - 76.8 % Final  . LYMPH% 11/21/2015 33.1  14.0 - 49.7 % Final  . MONO% 11/21/2015 9.3  0.0 - 14.0 % Final  . EOS% 11/21/2015 3.7  0.0 - 7.0 % Final  . BASO% 11/21/2015 0.4  0.0 - 2.0 % Final  . Sodium 11/21/2015 141  136 - 145 mEq/L  Final  . Potassium 11/21/2015 3.7  3.5 - 5.1 mEq/L Final  . Chloride 11/21/2015 109  98 - 109 mEq/L Final  . CO2 11/21/2015 25  22 - 29 mEq/L Final  . Glucose 11/21/2015 105  70 - 140 mg/dl Final   Glucose reference range is for nonfasting patients. Fasting glucose reference range is 70- 100.  Marland Kitchen BUN 11/21/2015 8.8  7.0 - 26.0 mg/dL Final  . Creatinine 11/21/2015 0.8  0.6 - 1.1 mg/dL Final  . Total Bilirubin 11/21/2015 0.67  0.20 - 1.20 mg/dL Final  . Alkaline Phosphatase 11/21/2015 69  40 - 150 U/L Final  . AST 11/21/2015 10   5 - 34 U/L Final  . ALT 11/21/2015 17  0 - 55 U/L Final  . Total Protein 11/21/2015 6.5  6.4 - 8.3 g/dL Final  . Albumin 11/21/2015 3.1* 3.5 - 5.0 g/dL Final  . Calcium 11/21/2015 8.8  8.4 - 10.4 mg/dL Final  . Anion Gap 11/21/2015 7  3 - 11 mEq/L Final  . EGFR 11/21/2015 85* >90 ml/min/1.73 m2 Final   eGFR is calculated using the CKD-EPI Creatinine Equation (2009)  . Magnesium 11/21/2015 1.3* 1.5 - 2.5 mg/dl Final    RADIOGRAPHIC STUDIES: No results found.  ASSESSMENT/PLAN:    Multiple myeloma in relapse [facility]) Patient has been taking Pomalyst oral therapy as directed.   She continues with Zometa infusions every 3 months.   Patient is scheduled to return for labs, flush, visit, and in Zometa infusion on 11/30/2015.          Dehydration Patient states that her nausea and intermittent vomiting has completely resolved.  She does occasionally have some occasional episodes of diarrhea.  She feels that she is no longer dehydrated; and is able to drink and eat with no difficulty now.    Nausea without vomiting Patient states that all of her nausea/vomiting has completely resolved and she no longer feels dehydrated.  Hypokalemia Patient has been taking 20 mEq of potassium.  3 times per day as directed.  She has also decreased her Lasix down to 80 mg once daily; and completely discontinued the Zaroxolyn diuretic as well.  Patient's potassium today has increased to 3.7.  Patient was advised to continue taking the oral potassium at home as directed.  Also, called and spoke to [employer], [employer] (pt's PCP) to review decreased diuretics.  Patient's primary care physician was in complete agreement with the plan to decrease the diuretics.  Patient will return on Friday, 11/24/2015 for repeat labs.  May need to consider decreasing the potassium dose at that time if potassium remains within normal limits.   Hypomagnesemia Patient's magnesium has been chronically low.  Patient's  magnesium was down to 1.3 today.  Patient has been prescribed magnesium 500 mg that she takes on a daily basis; but patient reports that she has not been taking the magnesium tablets for the past 2 days.  Patient was advised to begin taking the magnesium as previously directed.  Will recheck magnesium on Friday, 11/24/2015.  Diarrhea Patient states that she has had some intermittent, mild diarrhea; but takes Imodium as directed.    Patient stated understanding of all instructions; and was in agreement with this plan of care. The patient knows to call the clinic with any problems, questions or concerns.   Total time spent with patient was 25 minutes;  with greater than 75 percent of that time spent in face to face counseling regarding patient's symptoms,  and coordination of  care and follow up.  Disclaimer:This dictation was prepared with Dragon/digital dictation along with [employer]. Any transcriptional errors that result from this process are unintentional.  Drue Second, NP 11/22/2015

## 2015-11-22 NOTE — Assessment & Plan Note (Addendum)
Patient states that her nausea and intermittent vomiting has completely resolved.  She does occasionally have some occasional episodes of diarrhea.  She feels that she is no longer dehydrated; and is able to drink and eat with no difficulty now.

## 2015-11-22 NOTE — Assessment & Plan Note (Signed)
Patient's magnesium has been chronically low.  Patient's magnesium was down to 1.3 today.  Patient has been prescribed magnesium 500 mg that she takes on a daily basis; but patient reports that she has not been taking the magnesium tablets for the past 2 days.  Patient was advised to begin taking the magnesium as previously directed.  Will recheck magnesium on Friday, 11/24/2015.

## 2015-11-23 ENCOUNTER — Other Ambulatory Visit: Payer: Self-pay

## 2015-11-23 ENCOUNTER — Telehealth: Payer: Self-pay | Admitting: Internal Medicine

## 2015-11-23 ENCOUNTER — Telehealth: Payer: Self-pay | Admitting: *Deleted

## 2015-11-23 NOTE — Telephone Encounter (Signed)
Lm for pt confirming labs need to be drawn tomorrow to monitor potassium level. Advised in message pt needs to come or call to reschedule. She does not have to stay for results, we can call her with a report.

## 2015-11-23 NOTE — Telephone Encounter (Signed)
left msg for 5/26 added lab & flush

## 2015-11-24 ENCOUNTER — Ambulatory Visit: Payer: Medicare Other

## 2015-11-24 ENCOUNTER — Other Ambulatory Visit: Payer: Self-pay | Admitting: Nurse Practitioner

## 2015-11-24 ENCOUNTER — Ambulatory Visit (HOSPITAL_BASED_OUTPATIENT_CLINIC_OR_DEPARTMENT_OTHER): Payer: Medicare Other | Admitting: Nurse Practitioner

## 2015-11-24 ENCOUNTER — Other Ambulatory Visit (HOSPITAL_BASED_OUTPATIENT_CLINIC_OR_DEPARTMENT_OTHER): Payer: Medicare Other

## 2015-11-24 ENCOUNTER — Other Ambulatory Visit: Payer: Self-pay

## 2015-11-24 VITALS — BP 109/59 | Temp 98.8°F | Resp 20

## 2015-11-24 DIAGNOSIS — E860 Dehydration: Secondary | ICD-10-CM

## 2015-11-24 DIAGNOSIS — E876 Hypokalemia: Secondary | ICD-10-CM | POA: Diagnosis not present

## 2015-11-24 DIAGNOSIS — C9002 Multiple myeloma in relapse: Secondary | ICD-10-CM | POA: Diagnosis not present

## 2015-11-24 DIAGNOSIS — R5383 Other fatigue: Secondary | ICD-10-CM

## 2015-11-24 DIAGNOSIS — Z95828 Presence of other vascular implants and grafts: Secondary | ICD-10-CM

## 2015-11-24 LAB — CBC WITH DIFFERENTIAL/PLATELET
BASO%: 0.4 % (ref 0.0–2.0)
Basophils Absolute: 0 10*3/uL (ref 0.0–0.1)
EOS%: 2.6 % (ref 0.0–7.0)
Eosinophils Absolute: 0.1 10*3/uL (ref 0.0–0.5)
HEMATOCRIT: 29.5 % — AB (ref 34.8–46.6)
HGB: 9.7 g/dL — ABNORMAL LOW (ref 11.6–15.9)
LYMPH#: 1.2 10*3/uL (ref 0.9–3.3)
LYMPH%: 25.6 % (ref 14.0–49.7)
MCH: 33.8 pg (ref 25.1–34.0)
MCHC: 32.9 g/dL (ref 31.5–36.0)
MCV: 102.8 fL — ABNORMAL HIGH (ref 79.5–101.0)
MONO#: 0.2 10*3/uL (ref 0.1–0.9)
MONO%: 3.8 % (ref 0.0–14.0)
NEUT#: 3.2 10*3/uL (ref 1.5–6.5)
NEUT%: 67.6 % (ref 38.4–76.8)
Platelets: 183 10*3/uL (ref 145–400)
RBC: 2.87 10*6/uL — AB (ref 3.70–5.45)
RDW: 16.1 % — ABNORMAL HIGH (ref 11.2–14.5)
WBC: 4.7 10*3/uL (ref 3.9–10.3)

## 2015-11-24 LAB — COMPREHENSIVE METABOLIC PANEL
ALBUMIN: 3.1 g/dL — AB (ref 3.5–5.0)
ALK PHOS: 79 U/L (ref 40–150)
ALT: 17 U/L (ref 0–55)
ANION GAP: 9 meq/L (ref 3–11)
AST: 15 U/L (ref 5–34)
BILIRUBIN TOTAL: 0.7 mg/dL (ref 0.20–1.20)
BUN: 10.4 mg/dL (ref 7.0–26.0)
CO2: 20 mEq/L — ABNORMAL LOW (ref 22–29)
Calcium: 8.2 mg/dL — ABNORMAL LOW (ref 8.4–10.4)
Chloride: 109 mEq/L (ref 98–109)
Creatinine: 0.8 mg/dL (ref 0.6–1.1)
EGFR: 86 mL/min/{1.73_m2} — AB (ref >90)
GLUCOSE: 148 mg/dL — AB (ref 70–140)
Potassium: 4 mEq/L (ref 3.5–5.1)
SODIUM: 138 meq/L (ref 136–145)
TOTAL PROTEIN: 6.8 g/dL (ref 6.4–8.3)

## 2015-11-24 LAB — MAGNESIUM: MAGNESIUM: 1.6 mg/dL (ref 1.5–2.5)

## 2015-11-24 MED ORDER — SODIUM CHLORIDE 0.9 % IJ SOLN
10.0000 mL | INTRAMUSCULAR | Status: DC | PRN
  Administered 2015-11-24: 10 mL via INTRAVENOUS
  Filled 2015-11-24: qty 10

## 2015-11-24 MED ORDER — HEPARIN SOD (PORK) LOCK FLUSH 100 UNIT/ML IV SOLN
500.0000 [IU] | Freq: Once | INTRAVENOUS | Status: AC | PRN
  Administered 2015-11-24: 500 [IU] via INTRAVENOUS
  Filled 2015-11-24: qty 5

## 2015-11-25 ENCOUNTER — Other Ambulatory Visit: Payer: Self-pay | Admitting: Internal Medicine

## 2015-11-26 ENCOUNTER — Encounter: Payer: Self-pay | Admitting: Nurse Practitioner

## 2015-11-26 NOTE — Progress Notes (Signed)
SYMPTOM MANAGEMENT CLINIC    Chief Complaint: Nausea/vomiting, and dehydration  HPI:  [name] 65 y.o. female diagnosed with multiple myeloma.  Currently undergoing Pomalyst oral therapy and Zometa.  Patient in for recheck.  Patient states that her nausea and diarrhea have completely resolved. She is complaining of continued fatigue today.  She denies any recent fevers or chills.   No history exists.    Review of Systems  Constitutional: Positive for malaise/fatigue.  All other systems reviewed and are negative.   Past Medical History  Diagnosis Date  . Hypercholesterolemia   . Compression fracture 04/08/2007    pathologic compression fracture  . Hypothyroidism   . FHx: chemotherapy     s/p 5 cycle revlimid/low dose decadron,s/p velcade,doxil,decadron,  . Hx of radiation therapy 05/05/07-05/18/07,& 03/05/11-03/21/11-    l3&l5 in 2008, t2-t6 03/2011  . GERD (gastroesophageal reflux disease)   . Insomnia     associated with steroids  . Constipation     takes oxycontin,vicodin  . Hx of radiation therapy 05/05/2007 to 05/18/2007    palliative, L3-5  . Hx of radiation therapy 03/05/2011 to 03/21/2011    palliative T2-T6, c-spine  . 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 PONV (postoperative nausea and vomiting)   . Multiple myeloma ([place]) dx'd 2009  . Metastasis to bone ([place])   . Family history of anesthesia complication     "daughter gets PONV too"  . Pneumonia     "several times"  . Stroke [facility]) 2014    denies residual on 01/27/2014    Past Surgical History  Procedure Laterality Date  . Knee arthroscopy Right     "put pin in"  . Knee arthroscopy Right     "took pin out and corrected what was wrong"  . Video bronchoscopy  07/30/2011    Procedure: VIDEO BRONCHOSCOPY WITHOUT FLUORO;  Surgeon: Kathee Delton, MD;  Location: [facility];  Service: Cardiopulmonary;  Laterality: Bilateral;  . Cholecystectomy  1980's  .  Portacath placement Right 2009  . Vaginal hysterectomy  1980's    has Multiple myeloma in relapse ([place]); Hypothyroidism; GERD; Essential hypertension; History of autologous stem cell transplant ([place]); Hyperlipidemia; Vitamin D deficiency; CKD stage 3 due to type 2 diabetes mellitus ([place]); Pain and swelling of left lower extremity; Encounter for antineoplastic chemotherapy; Lytic bone lesions on xray; Dehydration; Peripheral edema; Nausea without vomiting; Hypokalemia; Bronchitis; Hypomagnesemia; and Diarrhea on her problem list.    has No Known Allergies.    Medication List       This list is accurate as of: 11/24/15 11:59 PM.  Always use your most recent med list.               ALPRAZolam 0.5 MG tablet  Commonly known as:  XANAX  TAKE 1/2 TO 1 TABLET 2 TO 3 TIMES DAILY AS NEEDED FOR ANXIETY     aspirin 81 MG tablet  Take 81 mg by mouth daily.     brompheniramine-pseudoephedrine-DM 30-2-10 MG/5ML syrup  Take 5 mLs by mouth 4 (four) times daily as needed.     cholestyramine 4 g packet  Commonly known as:  QUESTRAN  Take 1 packet by mouth 2 (two) times daily. Please take 2-3 hours after taking your regular medications     citalopram 40 MG tablet  Commonly known as:  CELEXA  Take 1 tablet (40 mg total) by mouth daily.     cyclobenzaprine 10 MG  tablet  Commonly known as:  FLEXERIL  Take 1 tablet (10 mg total) by mouth 3 (three) times daily as needed for muscle spasms.     dexamethasone 4 MG tablet  Commonly known as:  DECADRON  TAKE (10) TABLETS BY MOUTH EVERY FRIDAY     DIPHENHIST 12.5 MG/5ML liquid  Generic drug:  diphenhydrAMINE     diphenoxylate-atropine 2.5-0.025 MG tablet  Commonly known as:  LOMOTIL  TAKE 2 TABLETS BY MOUTH 4 TIMES A DAY AS NEEDED FOR DIARRHEA     erythromycin ophthalmic ointment     Eszopiclone 3 MG Tabs  Take 1 tablet (3 mg total) by mouth at bedtime as needed. for sleep     fexofenadine 60 MG tablet  Commonly known as:  ALLEGRA  Take 1  tablet daily as needed for allergies     furosemide 40 MG tablet  Commonly known as:  LASIX  TAKE 1 TABLET BY MOUTH TWICE A DAY FOR FLUID AND SWELLING     gabapentin 600 MG tablet  Commonly known as:  NEURONTIN  TAKE 1 TABLET 4 X DAILY AS NEEDED FOR PAIN OR CRAMPS     HYDROcodone-acetaminophen 7.5-325 MG tablet  Commonly known as:  NORCO  Take 1 tablet by mouth every 6 (six) hours as needed for moderate pain.     hyoscyamine 0.125 MG SL tablet  Commonly known as:  LEVSIN SL  Take 1 tablet (0.125 mg total) by mouth every 6 (six) hours as needed.     KLOR-CON 10 10 MEQ tablet  Generic drug:  potassium chloride  TAKE 1 TABLET BY MOUTH TWICE A DAY     levothyroxine 100 MCG tablet  Commonly known as:  SYNTHROID, LEVOTHROID  Take 100 mcg by mouth daily before breakfast.     loperamide 2 MG capsule  Commonly known as:  IMODIUM  Take 2 mg by mouth as needed for diarrhea or loose stools. Reported on 10/02/2015     Magnesium Hydroxide 400 MG Chew  Chew 2 tablets by mouth daily.     metolazone 2.5 MG tablet  Commonly known as:  ZAROXOLYN  Please take 1 tablet on Wednesday and Sunday in the morning for swelling.     mometasone 50 MCG/ACT nasal spray  Commonly known as:  NASONEX  Place 2 sprays into the nose daily.     montelukast 10 MG tablet  Commonly known as:  SINGULAIR  TAKE 1 TABLET BY MOUTH EVERY DAY     morphine 15 MG 12 hr tablet  Commonly known as:  MS CONTIN  Take 1 tablet (15 mg total) by mouth every 12 (twelve) hours.     multivitamin with minerals Tabs tablet  Take 1 tablet by mouth every morning.     nystatin 100000 UNIT/ML suspension  Commonly known as:  MYCOSTATIN  Gargle 5 mL PO q6hours and spit out after 20-30 seconds.  Use for 1 week     ondansetron 8 MG disintegrating tablet  Commonly known as:  ZOFRAN-ODT  Take 1 tablet (8 mg total) by mouth every 8 (eight) hours as needed for nausea or vomiting.     pantoprazole 40 MG tablet  Commonly known as:   PROTONIX  TAKE 1 TABLET (40 MG TOTAL) BY MOUTH 2 (TWO) TIMES DAILY.     pomalidomide 2 MG capsule  Commonly known as:  POMALYST  Take 1 capsule (2 mg total) by mouth daily. Take with water on days 1-21. Repeat every 28 days. 11/07/15 auth number  [id number] adult female not of childbearing potential. Faxed to [employer]     predniSONE 20 MG tablet  Commonly known as:  DELTASONE  3 tabs po daily x 3 days, then 2 tabs x 3 days, then 1.5 tabs x 3 days, then 1 tab x 3 days, then 0.5 tabs x 3 days     promethazine-dextromethorphan 6.25-15 MG/5ML syrup  Commonly known as:  PROMETHAZINE-DM  TAKE 5MLS BY MOUTH 4 TIMES A DAY AS NEEDED FOR COUGH         PHYSICAL EXAMINATION  Oncology Vitals 11/24/2015 11/21/2015  Height - 165 cm  Weight - 95.301 kg  Weight (lbs) - 210 lbs 2 oz  BMI (kg/m2) - 34.96 kg/m2  Temp 98.8 98.4  Pulse - 82  Resp 20 18  SpO2 100 100  BSA (m2) - 2.09 m2   BP Readings from Last 2 Encounters:  11/24/15 109/59  11/21/15 132/75    Physical Exam  Constitutional: She is oriented to person, place, and time and well-developed, well-nourished, and in no distress.  HENT:  Head: Normocephalic and atraumatic.  Mouth/Throat: Oropharynx is clear and moist.  Eyes: Conjunctivae and EOM are normal. Pupils are equal, round, and reactive to light. Right eye exhibits no discharge. Left eye exhibits no discharge. No scleral icterus.  Neck: Normal range of motion. Neck supple. No JVD present. No tracheal deviation present. No thyromegaly present.  Cardiovascular: Normal rate, regular rhythm, normal heart sounds and intact distal pulses.   Pulmonary/Chest: Effort normal and breath sounds normal. No respiratory distress. She has no wheezes. She has no rales. She exhibits no tenderness.  Abdominal: Soft. Bowel sounds are normal. She exhibits no distension and no mass. There is no tenderness. There is no rebound and no guarding.  Musculoskeletal: Normal range of motion. She exhibits no edema  or tenderness.  Lymphadenopathy:    She has no cervical adenopathy.  Neurological: She is alert and oriented to person, place, and time. Gait normal.  Skin: Skin is warm and dry. No rash noted. No erythema. No pallor.  Psychiatric: Affect normal.  Nursing note and vitals reviewed.   LABORATORY DATA:. Appointment on 11/24/2015  Component Date Value Ref Range Status  . Sodium 11/24/2015 138  136 - 145 mEq/L Final  . Potassium 11/24/2015 4.0  3.5 - 5.1 mEq/L Final  . Chloride 11/24/2015 109  98 - 109 mEq/L Final  . CO2 11/24/2015 20* 22 - 29 mEq/L Final  . Glucose 11/24/2015 148* 70 - 140 mg/dl Final   Glucose reference range is for nonfasting patients. Fasting glucose reference range is 70- 100.  Marland Kitchen BUN 11/24/2015 10.4  7.0 - 26.0 mg/dL Final  . Creatinine 11/24/2015 0.8  0.6 - 1.1 mg/dL Final  . Total Bilirubin 11/24/2015 0.70  0.20 - 1.20 mg/dL Final  . Alkaline Phosphatase 11/24/2015 79  40 - 150 U/L Final  . AST 11/24/2015 15  5 - 34 U/L Final  . ALT 11/24/2015 17  0 - 55 U/L Final  . Total Protein 11/24/2015 6.8  6.4 - 8.3 g/dL Final  . Albumin 11/24/2015 3.1* 3.5 - 5.0 g/dL Final  . Calcium 11/24/2015 8.2* 8.4 - 10.4 mg/dL Final  . Anion Gap 11/24/2015 9  3 - 11 mEq/L Final  . EGFR 11/24/2015 86* >90 ml/min/1.73 m2 Final   eGFR is calculated using the CKD-EPI Creatinine Equation (2009)  . WBC 11/24/2015 4.7  3.9 - 10.3 10e3/uL Final  . NEUT# 11/24/2015 3.2  1.5 - 6.5 10e3/uL Final  .  HGB 11/24/2015 9.7* 11.6 - 15.9 g/dL Final  . HCT 11/24/2015 29.5* 34.8 - 46.6 % Final  . Platelets 11/24/2015 183  145 - 400 10e3/uL Final  . MCV 11/24/2015 102.8* 79.5 - 101.0 fL Final  . MCH 11/24/2015 33.8  25.1 - 34.0 pg Final  . MCHC 11/24/2015 32.9  31.5 - 36.0 g/dL Final  . RBC 11/24/2015 2.87* 3.70 - 5.45 10e6/uL Final  . RDW 11/24/2015 16.1* 11.2 - 14.5 % Final  . lymph# 11/24/2015 1.2  0.9 - 3.3 10e3/uL Final  . MONO# 11/24/2015 0.2  0.1 - 0.9 10e3/uL Final  . Eosinophils Absolute  11/24/2015 0.1  0.0 - 0.5 10e3/uL Final  . Basophils Absolute 11/24/2015 0.0  0.0 - 0.1 10e3/uL Final  . NEUT% 11/24/2015 67.6  38.4 - 76.8 % Final  . LYMPH% 11/24/2015 25.6  14.0 - 49.7 % Final  . MONO% 11/24/2015 3.8  0.0 - 14.0 % Final  . EOS% 11/24/2015 2.6  0.0 - 7.0 % Final  . BASO% 11/24/2015 0.4  0.0 - 2.0 % Final  . Magnesium 11/24/2015 1.6  1.5 - 2.5 mg/dl Final    RADIOGRAPHIC STUDIES: No results found.  ASSESSMENT/PLAN:    Multiple myeloma in relapse [facility]) Patient has been taking Pomalyst oral therapy as directed.   She continues with Zometa infusions every 3 months.   Patient is scheduled to return for labs, flush, visit, and in Zometa infusion on 11/30/2015.            Dehydration Patient states that her nausea and intermittent vomiting has completely resolved.  She does occasionally have some occasional episodes of diarrhea.  She feels that she is no longer dehydrated; and is able to drink and eat with no difficulty now.      Hypokalemia Potassium is now 4.0.  Patient was advised to decrease the potassium to only 20 mEq twice daily.  Patient is scheduled to return on 11/30/2015 for labs, visit, flush, and Zometa infusion.  Hypomagnesemia Magnesium is now within normal limits at 1.6.  Patient continues to take magnesium.  3 times per day orally.    Patient stated understanding of all instructions; and was in agreement with this plan of care. The patient knows to call the clinic with any problems, questions or concerns.   Total time spent with patient was 15 minutes;  with greater than 75 percent of that time spent in face to face counseling regarding patient's symptoms,  and coordination of care and follow up.  Disclaimer:This dictation was prepared with Dragon/digital dictation along with [employer]. Any transcriptional errors that result from this process are unintentional.  Drue Second, NP 11/26/2015

## 2015-11-26 NOTE — Assessment & Plan Note (Signed)
Patient states that her nausea and intermittent vomiting has completely resolved.  She does occasionally have some occasional episodes of diarrhea.  She feels that she is no longer dehydrated; and is able to drink and eat with no difficulty now.

## 2015-11-26 NOTE — Assessment & Plan Note (Signed)
Magnesium is now within normal limits at 1.6.  Patient continues to take magnesium.  3 times per day orally.

## 2015-11-26 NOTE — Assessment & Plan Note (Signed)
Patient has been taking Pomalyst oral therapy as directed.   She continues with Zometa infusions every 3 months.   Patient is scheduled to return for labs, flush, visit, and in Zometa infusion on 11/30/2015.

## 2015-11-26 NOTE — Assessment & Plan Note (Signed)
Potassium is now 4.0.  Patient was advised to decrease the potassium to only 20 mEq twice daily.  Patient is scheduled to return on 11/30/2015 for labs, visit, flush, and Zometa infusion.

## 2015-11-27 ENCOUNTER — Other Ambulatory Visit: Payer: Self-pay | Admitting: Internal Medicine

## 2015-11-27 DIAGNOSIS — F419 Anxiety disorder, unspecified: Secondary | ICD-10-CM

## 2015-11-28 ENCOUNTER — Other Ambulatory Visit: Payer: Self-pay | Admitting: *Deleted

## 2015-11-28 DIAGNOSIS — C9000 Multiple myeloma not having achieved remission: Secondary | ICD-10-CM

## 2015-11-28 DIAGNOSIS — Z5111 Encounter for antineoplastic chemotherapy: Secondary | ICD-10-CM

## 2015-11-28 MED ORDER — HYDROCODONE-ACETAMINOPHEN 7.5-325 MG PO TABS: 1.0000 | ORAL_TABLET | Freq: Four times a day (QID) | ORAL | Status: DC | PRN

## 2015-11-28 NOTE — Telephone Encounter (Signed)
After Hours triage fax received pt requesting refill on hydrocodone. Reviewed with MD ok to refill. Pt notified.

## 2015-11-30 ENCOUNTER — Other Ambulatory Visit: Payer: Self-pay | Admitting: *Deleted

## 2015-11-30 ENCOUNTER — Other Ambulatory Visit (HOSPITAL_BASED_OUTPATIENT_CLINIC_OR_DEPARTMENT_OTHER): Payer: Medicare Other

## 2015-11-30 ENCOUNTER — Encounter: Payer: Self-pay | Admitting: Internal Medicine

## 2015-11-30 ENCOUNTER — Ambulatory Visit (HOSPITAL_BASED_OUTPATIENT_CLINIC_OR_DEPARTMENT_OTHER): Payer: Medicare Other

## 2015-11-30 ENCOUNTER — Ambulatory Visit: Payer: Medicare Other

## 2015-11-30 ENCOUNTER — Telehealth: Payer: Self-pay | Admitting: Internal Medicine

## 2015-11-30 ENCOUNTER — Other Ambulatory Visit: Payer: Self-pay | Admitting: Medical Oncology

## 2015-11-30 ENCOUNTER — Telehealth: Payer: Self-pay | Admitting: *Deleted

## 2015-11-30 ENCOUNTER — Ambulatory Visit (HOSPITAL_BASED_OUTPATIENT_CLINIC_OR_DEPARTMENT_OTHER): Payer: Medicare Other | Admitting: Internal Medicine

## 2015-11-30 VITALS — BP 128/64 | HR 76 | Temp 98.0°F | Resp 18 | Ht 65.0 in | Wt 214.3 lb

## 2015-11-30 DIAGNOSIS — C9002 Multiple myeloma in relapse: Secondary | ICD-10-CM

## 2015-11-30 DIAGNOSIS — C9000 Multiple myeloma not having achieved remission: Secondary | ICD-10-CM | POA: Diagnosis not present

## 2015-11-30 DIAGNOSIS — Z5111 Encounter for antineoplastic chemotherapy: Secondary | ICD-10-CM

## 2015-11-30 DIAGNOSIS — G620 Drug-induced polyneuropathy: Secondary | ICD-10-CM

## 2015-11-30 DIAGNOSIS — Z452 Encounter for adjustment and management of vascular access device: Secondary | ICD-10-CM | POA: Diagnosis not present

## 2015-11-30 DIAGNOSIS — M899 Disorder of bone, unspecified: Secondary | ICD-10-CM

## 2015-11-30 DIAGNOSIS — Z95828 Presence of other vascular implants and grafts: Secondary | ICD-10-CM

## 2015-11-30 DIAGNOSIS — E876 Hypokalemia: Secondary | ICD-10-CM

## 2015-11-30 LAB — COMPREHENSIVE METABOLIC PANEL
ALT: 14 U/L (ref 0–55)
AST: 11 U/L (ref 5–34)
Albumin: 2.9 g/dL — ABNORMAL LOW (ref 3.5–5.0)
Alkaline Phosphatase: 89 U/L (ref 40–150)
Anion Gap: 7 mEq/L (ref 3–11)
BUN: 9.5 mg/dL (ref 7.0–26.0)
CHLORIDE: 103 meq/L (ref 98–109)
CO2: 29 meq/L (ref 22–29)
CREATININE: 1 mg/dL (ref 0.6–1.1)
Calcium: 9.5 mg/dL (ref 8.4–10.4)
EGFR: 70 mL/min/{1.73_m2} — ABNORMAL LOW (ref >90)
GLUCOSE: 103 mg/dL (ref 70–140)
POTASSIUM: 3.7 meq/L (ref 3.5–5.1)
SODIUM: 139 meq/L (ref 136–145)
Total Bilirubin: 0.61 mg/dL (ref 0.20–1.20)
Total Protein: 6.5 g/dL (ref 6.4–8.3)

## 2015-11-30 LAB — CBC WITH DIFFERENTIAL/PLATELET
BASO%: 0.8 % (ref 0.0–2.0)
BASOS ABS: 0 10*3/uL (ref 0.0–0.1)
EOS%: 5.2 % (ref 0.0–7.0)
Eosinophils Absolute: 0.1 10*3/uL (ref 0.0–0.5)
HEMATOCRIT: 28.2 % — AB (ref 34.8–46.6)
HGB: 9.2 g/dL — ABNORMAL LOW (ref 11.6–15.9)
LYMPH#: 1 10*3/uL (ref 0.9–3.3)
LYMPH%: 39.8 % (ref 14.0–49.7)
MCH: 33.8 pg (ref 25.1–34.0)
MCHC: 32.8 g/dL (ref 31.5–36.0)
MCV: 103 fL — ABNORMAL HIGH (ref 79.5–101.0)
MONO#: 0.3 10*3/uL (ref 0.1–0.9)
MONO%: 11.4 % (ref 0.0–14.0)
NEUT#: 1 10*3/uL — ABNORMAL LOW (ref 1.5–6.5)
NEUT%: 42.8 % (ref 38.4–76.8)
Platelets: 157 10*3/uL (ref 145–400)
RBC: 2.74 10*6/uL — AB (ref 3.70–5.45)
RDW: 16.7 % — ABNORMAL HIGH (ref 11.2–14.5)
WBC: 2.4 10*3/uL — ABNORMAL LOW (ref 3.9–10.3)

## 2015-11-30 LAB — MAGNESIUM: Magnesium: 1.7 mg/dl (ref 1.5–2.5)

## 2015-11-30 LAB — LACTATE DEHYDROGENASE: LDH: 134 U/L (ref 125–245)

## 2015-11-30 MED ORDER — MORPHINE SULFATE ER 15 MG PO TBCR: 15.0000 mg | EXTENDED_RELEASE_TABLET | Freq: Two times a day (BID) | ORAL | Status: DC

## 2015-11-30 MED ORDER — SODIUM CHLORIDE 0.9 % IJ SOLN
10.0000 mL | INTRAMUSCULAR | Status: DC | PRN
  Administered 2015-11-30: 10 mL via INTRAVENOUS
  Filled 2015-11-30: qty 10

## 2015-11-30 MED ORDER — HEPARIN SOD (PORK) LOCK FLUSH 100 UNIT/ML IV SOLN
500.0000 [IU] | Freq: Once | INTRAVENOUS | Status: AC | PRN
  Administered 2015-11-30: 500 [IU] via INTRAVENOUS
  Filled 2015-11-30: qty 5

## 2015-11-30 MED ORDER — TRAZODONE HCL 150 MG PO TABS
ORAL_TABLET | ORAL | Status: DC
Start: 2015-11-30 — End: 2015-12-08

## 2015-11-30 MED ORDER — ZOLEDRONIC ACID 4 MG/100ML IV SOLN
4.0000 mg | Freq: Once | INTRAVENOUS | Status: AC
  Administered 2015-11-30: 4 mg via INTRAVENOUS
  Filled 2015-11-30: qty 100

## 2015-11-30 MED ORDER — SODIUM CHLORIDE 0.9 % IV SOLN
INTRAVENOUS | Status: DC
  Administered 2015-11-30: 11:00:00 via INTRAVENOUS

## 2015-11-30 NOTE — Telephone Encounter (Signed)
Patient called and states her [name] is not helping her to sleep.  Per Dr Melford Aase, patient should stop [name] and try a new RX for Trazodone, which was sent to the pharmacy.

## 2015-11-30 NOTE — Telephone Encounter (Signed)
per -pof to sch pt appt-gave pt copy of avs-MW sch zometa

## 2015-11-30 NOTE — Telephone Encounter (Signed)
Per staff message and POF I have scheduled appts. Advised scheduler of appts. JMW  

## 2015-11-30 NOTE — Progress Notes (Signed)
[place] [phone]   [phone] [place], [place] [address]  DIAGNOSIS: Recurrent multiple myeloma initially diagnosed in October 2008.   PRIOR THERAPY:  1. Status post palliative radiotherapy to the lumbar spine between L3 and L5. The patient received a total dose of 3000 cGy in 10 fractions under the care of Dr. Lisbeth Renshaw between May 05, 2007 through May 18, 2007. 2. Status post 5 cycles of systemic chemotherapy with Revlimid and low-dose Decadron with good response to this treatment. 3. Status post autologous peripheral blood stem cell transplant at [facility] on Nov 20, 2007 under the care of Dr. Valarie Merino. 4. Status post treatment for disease recurrence with Velcade, Doxil and Decadron. Last dose given Nov 09, 2009. Discontinued secondary to intolerance but the patient had a good response to treatment at that time. 5. Status post palliative radiotherapy to the T2-T6 thoracic vertebrae completed 03/21/2011 under the care of Dr. Lisbeth Renshaw. 6. Systemic chemotherapy with Velcade 1.3 mg per meter squared given on days 1, 4, 8 and 11, and Doxil at 30 mg per meter squared given on day 4 in addition to Decadron status post 4 cycles, discontinued secondary to intolerance. 7. Systemic therapy with Velcade 1.3 mg/M2 subcutaneously in addition to Decadron 40 mg by mouth on a weekly basis, status post 20 cycles. The patient had good response with this treatment but it is discontinue today secondary to worsening peripheral neuropathy. 8. Palliative radiotherapy to the skull lesion as well as the left hip area under the care of Dr. Lisbeth Renshaw. 9. Systemic chemotherapy with Carfilzomib 20 mg/M2 on days 1, 2,  8, 9, 13 and 16 every 4 weeks in addition to weekly Decadron 40 mg by mouth. First dose on 04/19/2013. Status post 4 cycles, discontinued recently secondary to cardiac  dysfunction.  CURRENT THERAPY:  1. Pomalyst 3 mg by mouth daily for 21 days every 4 weeks in addition to dexamethasone 40 mg on a weekly basis. First dose started 12/02/2013. Status post 22 cycles. She will start cycle #24 on 11/07/2015. Her dose of Pomalyst will be reduced to 2 mg by mouth daily for 21 days every 4 weeks starting from cycle 23.  2. Zometa 4 mg IV given every 3 months.   INTERVAL HISTORY: [name] 65 y.o. female returns to the clinic today for follow up visit accompanied by her daughter. The patient is feeling fine today with no specific complaints except for neck and lower back pain. She was seen by Dr. Joya Salm and he recommended surgical intervention for her but she is very reluctant about proceeding with surgery. She is currently on Pomalyst 2 mg by mouth daily for 21 days every 4 weeks status post 2 cycles of this treatment and tolerated it well. She had a few episodes of diarrhea over the last few weeks with hypokalemia and hypomagnesemia. She was treated with magnesium and potassium supplements last week. The patient denied having any significant chest pain, shortness of breath, or hemoptysis. She has no nausea or vomiting. She has no weight loss or night sweats. She continues to have mild peripheral neuropathy and currently on gabapentin. She had repeat myeloma panel earlier today and she is here for evaluation and discussion of her lab results.  MEDICAL HISTORY: Past Medical History  Diagnosis Date  . Hypercholesterolemia   . Compression fracture 04/08/2007    pathologic compression fracture  . Hypothyroidism   .  FHx: chemotherapy     s/p 5 cycle revlimid/low dose decadron,s/p velcade,doxil,decadron,  . Hx of radiation therapy 05/05/07-05/18/07,& 03/05/11-03/21/11-    l3&l5 in 2008, t2-t6 03/2011  . GERD (gastroesophageal reflux disease)   . Insomnia     associated with steroids  . Constipation     takes oxycontin,vicodin  . Hx of radiation therapy 05/05/2007 to  05/18/2007    palliative, L3-5  . Hx of radiation therapy 03/05/2011 to 03/21/2011    palliative T2-T6, c-spine  . History of autologous stem cell transplant ([place]) 11/20/2007    [facility], Dr Valarie Merino  . PONV (postoperative nausea and vomiting)   . Multiple myeloma ([place]) dx'd 2009  . Metastasis to bone ([place])   . Family history of anesthesia complication     "daughter gets PONV too"  . Pneumonia     "several times"  . Stroke [facility]) 2014    denies residual on 01/27/2014    ALLERGIES:  has No Known Allergies.  MEDICATIONS:  Current Outpatient Prescriptions  Medication Sig Dispense Refill  . ALPRAZolam (XANAX) 0.5 MG tablet TAKE 1/2 TO 1 TABLET 2 TO 3 TIMES DAILY AS NEEDED FOR ANXIETY 270 tablet 1  . aspirin 81 MG tablet Take 81 mg by mouth daily.    . brompheniramine-pseudoephedrine-DM 30-2-10 MG/5ML syrup Take 5 mLs by mouth 4 (four) times daily as needed. 473 mL 2  . cholestyramine (QUESTRAN) 4 g packet Take 1 packet by mouth 2 (two) times daily. Please take 2-3 hours after taking your regular medications 60 each 11  . citalopram (CELEXA) 40 MG tablet Take 1 tablet (40 mg total) by mouth daily. (Patient taking differently: Take 20 mg by mouth daily. ) 90 tablet 2  . cyclobenzaprine (FLEXERIL) 10 MG tablet Take 1 tablet (10 mg total) by mouth 3 (three) times daily as needed for muscle spasms. 90 tablet 3  . dexamethasone (DECADRON) 4 MG tablet TAKE (10) TABLETS BY MOUTH EVERY FRIDAY 40 tablet 1  . DIPHENHIST 12.5 MG/5ML liquid     . Eszopiclone 3 MG TABS Take 1 tablet (3 mg total) by mouth at bedtime as needed. for sleep 30 tablet 2  . furosemide (LASIX) 40 MG tablet TAKE 1 TABLET BY MOUTH TWICE A DAY FOR FLUID AND SWELLING 60 tablet 3  . gabapentin (NEURONTIN) 600 MG tablet TAKE 1 TABLET 4 X DAILY AS NEEDED FOR PAIN OR CRAMPS 120 tablet 4  . HYDROcodone-acetaminophen (NORCO) 7.5-325 MG tablet Take 1 tablet by mouth every 6 (six) hours as needed for moderate pain. 30 tablet 0  . hyoscyamine  (LEVSIN SL) 0.125 MG SL tablet Take 1 tablet (0.125 mg total) by mouth every 6 (six) hours as needed. 90 tablet 0  . KLOR-CON 10 10 MEQ tablet TAKE 1 TABLET BY MOUTH TWICE A DAY 180 tablet 1  . levothyroxine (SYNTHROID, LEVOTHROID) 100 MCG tablet Take 100 mcg by mouth daily before breakfast.     . loperamide (IMODIUM) 2 MG capsule Take 2 mg by mouth as needed for diarrhea or loose stools. Reported on 10/02/2015    . Magnesium Hydroxide 400 MG CHEW Chew 2 tablets by mouth daily.     . metolazone (ZAROXOLYN) 2.5 MG tablet Please take 1 tablet on Wednesday and Sunday in the morning for swelling. 30 tablet 1  . mometasone (NASONEX) 50 MCG/ACT nasal spray Place 2 sprays into the nose daily. 17 g 2  . montelukast (SINGULAIR) 10 MG tablet TAKE 1 TABLET BY MOUTH EVERY DAY 30 tablet 2  .  morphine (MS CONTIN) 15 MG 12 hr tablet Take 1 tablet (15 mg total) by mouth every 12 (twelve) hours. 60 tablet 0  . Multiple Vitamin (MULITIVITAMIN WITH MINERALS) TABS Take 1 tablet by mouth every morning.     . nystatin (MYCOSTATIN) 100000 UNIT/ML suspension Gargle 5 mL PO q6hours and spit out after 20-30 seconds.  Use for 1 week 180 mL 0  . ondansetron (ZOFRAN-ODT) 8 MG disintegrating tablet Take 1 tablet (8 mg total) by mouth every 8 (eight) hours as needed for nausea or vomiting. 30 tablet 2  . pantoprazole (PROTONIX) 40 MG tablet TAKE 1 TABLET (40 MG TOTAL) BY MOUTH 2 (TWO) TIMES DAILY. 60 tablet 5  . POMALYST 3 MG capsule     . promethazine-dextromethorphan (PROMETHAZINE-DM) 6.25-15 MG/5ML syrup TAKE 5MLS BY MOUTH 4 TIMES A DAY AS NEEDED FOR COUGH 180 mL 0  . diphenoxylate-atropine (LOMOTIL) 2.5-0.025 MG tablet TAKE 2 TABLETS BY MOUTH 4 TIMES A DAY AS NEEDED FOR DIARRHEA 30 tablet 1  . erythromycin ophthalmic ointment Reported on 11/30/2015    . fexofenadine (ALLEGRA) 60 MG tablet Take 1 tablet daily as needed for allergies 90 tablet 1   No current facility-administered medications for this visit.    SURGICAL  HISTORY:  Past Surgical History  Procedure Laterality Date  . Knee arthroscopy Right     "put pin in"  . Knee arthroscopy Right     "took pin out and corrected what was wrong"  . Video bronchoscopy  07/30/2011    Procedure: VIDEO BRONCHOSCOPY WITHOUT FLUORO;  Surgeon: Kathee Delton, MD;  Location: [facility];  Service: Cardiopulmonary;  Laterality: Bilateral;  . Cholecystectomy  1980's  . Portacath placement Right 2009  . Vaginal hysterectomy  1980's    REVIEW OF SYSTEMS:  A comprehensive review of systems was negative except for: Constitutional: positive for fatigue Musculoskeletal: positive for back pain and neck pain   PHYSICAL EXAMINATION: General appearance: alert, cooperative, fatigued and no distress Head: Normocephalic, without obvious abnormality, atraumatic Neck: no adenopathy, no JVD, supple, symmetrical, trachea midline and thyroid not enlarged, symmetric, no tenderness/mass/nodules Lymph nodes: Cervical, supraclavicular, and axillary nodes normal. Resp: clear to auscultation bilaterally Back: symmetric, no curvature. ROM normal. No CVA tenderness. Cardio: regular rate and rhythm, S1, S2 normal, no murmur, click, rub or gallop GI: soft, non-tender; bowel sounds normal; no masses,  no organomegaly Extremities: edema 1+ edema of left lower extremity. Neurologic: Alert and oriented X 3, normal strength and tone. Normal symmetric reflexes. Normal coordination and gait  ECOG PERFORMANCE STATUS: 1 - Symptomatic but completely ambulatory  Blood pressure 128/64, pulse 76, temperature 98 F (36.7 C), temperature source Oral, resp. rate 18, height _0  (1.651 m), weight 214 lb 4.8 oz (97.206 kg), SpO2 100 %.  LABORATORY DATA: Lab Results  Component Value Date   WBC 2.4* 11/30/2015   HGB 9.2* 11/30/2015   HCT 28.2* 11/30/2015   MCV 103.0* 11/30/2015   PLT 157 11/30/2015      Chemistry      Component Value Date/Time   NA 138 11/24/2015 1016   NA 134* 10/03/2015 1147    K 4.0 11/24/2015 1016   K 4.2 10/03/2015 1147   CL 99 10/03/2015 1147   CL 105 12/17/2012 1015   CO2 20* 11/24/2015 1016   CO2 29 10/03/2015 1147   BUN 10.4 11/24/2015 1016   BUN 20 10/03/2015 1147   CREATININE 0.8 11/24/2015 1016   CREATININE 0.89 10/03/2015 1147   CREATININE 0.82  05/05/2015 1400      Component Value Date/Time   CALCIUM 8.2* 11/24/2015 1016   CALCIUM 9.2 10/03/2015 1147   ALKPHOS 79 11/24/2015 1016   ALKPHOS 56 10/03/2015 1147   AST 15 11/24/2015 1016   AST 12 10/03/2015 1147   ALT 17 11/24/2015 1016   ALT 27 10/03/2015 1147   BILITOT 0.70 11/24/2015 1016   BILITOT 0.8 10/03/2015 1147     Myeloma panel: is still pending.  RADIOGRAPHIC STUDIES: Dg Chest 1 View  10/31/2015  CLINICAL DATA:  Cough, encounter for antineoplastic chemotherapy, history of multiple myeloma EXAM: CHEST 1 VIEW COMPARISON:  Chest x-ray of 09/21/2015 FINDINGS: No active infiltrate or effusion is seen. Mediastinal and hilar contours are unremarkable. A right-sided Port-A-Cath is present with the tip overlying the lower SVC. The heart is within normal limits in size. No bony abnormality is seen. IMPRESSION: No active lung disease.  Port-A-Cath tip overlies the lower SVC. Electronically Signed   By: Ivar Drape M.D.   On: 10/31/2015 14:49   ASSESSMENT AND PLAN:  This is a very pleasant 65 years old [place] American female with recurrent multiple myeloma recently completed a course of treatment with Velcade and Decadron with improvement in her disease but this was discontinued secondary to peripheral neuropathy. The patient tolerated her treatment with Carfilzomib and Decadron fairly well except for the recent shortness breath and cough which was felt to be secondary to congestive heart failure from her treatment with Carfilzomib. This treatment was discontinued. She was started on treatment with Pomalyst and Decadron status post 24 cycles. She tolerated the last cycle of her treatment well. The  myeloma panel is still pending. I recommended for the patient to continue her treatment with Pomalyst at reduced dose of 2 mg visit was no evidence of disease progression on the pending myeloma panel. She will call early next week for the lab results and refill of Pomalyst. I would see the patient back for follow-up visit in one month for reevaluation with repeat myeloma panel for evaluation of her disease. For the myeloma panel bone lesions, the patient will continue her current treatment with Zometa every 3 months as scheduled.  For the back pain and recent finding on MRI of the lumbar spines, she was seen by Dr. Joya Salm and he recommended surgical intervention for her but the patient is still reluctant about this option.Marland Kitchen She was given a refill of Vicodin. She was advised to call immediately if she has any concerning symptoms in the interval. The patient voices understanding of current disease status and treatment options and is in agreement with the current care plan.  All questions were answered. The patient knows to call the clinic with any problems, questions or concerns. We can certainly see the patient much sooner if necessary.  Disclaimer: This note was dictated with voice recognition software. Similar sounding words can inadvertently be transcribed and may be missed upon review.

## 2015-11-30 NOTE — Patient Instructions (Signed)

## 2015-11-30 NOTE — Patient Instructions (Signed)

## 2015-12-01 LAB — IGG, IGA, IGM
IGA/IMMUNOGLOBULIN A, SERUM: 105 mg/dL (ref 87–352)
IGM (IMMUNOGLOBIN M), SRM: 12 mg/dL — AB (ref 26–217)
IgG, Qn, Serum: 1136 mg/dL (ref 700–1600)

## 2015-12-01 LAB — KAPPA/LAMBDA LIGHT CHAINS
IG LAMBDA FREE LIGHT CHAIN: 137.72 mg/L — AB (ref 5.71–26.30)
Ig Kappa Free Light Chain: 26.97 mg/L — ABNORMAL HIGH (ref 3.30–19.40)
KAPPA/LAMBDA FLC RATIO: 0.2 — AB (ref 0.26–1.65)

## 2015-12-01 LAB — BETA 2 MICROGLOBULIN, SERUM: Beta-2: 2.7 mg/L — ABNORMAL HIGH (ref 0.6–2.4)

## 2015-12-04 ENCOUNTER — Telehealth: Payer: Self-pay | Admitting: *Deleted

## 2015-12-04 ENCOUNTER — Other Ambulatory Visit: Payer: Self-pay | Admitting: Medical Oncology

## 2015-12-04 DIAGNOSIS — C9002 Multiple myeloma in relapse: Secondary | ICD-10-CM

## 2015-12-04 MED ORDER — POMALIDOMIDE 2 MG PO CAPS: 2.0000 mg | ORAL_CAPSULE | Freq: Every day | ORAL | Status: DC

## 2015-12-04 NOTE — Telephone Encounter (Signed)
"  I had lab last week.  Dr, Julien Nordmann instructed me to call today.  I am to resume the [facility] Thursday June 8th.  Return number [phone]."  This nurse reviewed neutropenic precautions with this call.  Neutrophils = 1.0.

## 2015-12-04 NOTE — Progress Notes (Signed)
Pomalyst rx sent to [facility]pt notified.

## 2015-12-04 NOTE — Telephone Encounter (Signed)
I told pt to start pomalyst.

## 2015-12-07 ENCOUNTER — Telehealth: Payer: Self-pay

## 2015-12-07 ENCOUNTER — Telehealth: Payer: Self-pay | Admitting: *Deleted

## 2015-12-07 NOTE — Telephone Encounter (Signed)
Pt called stating she has a cold started Mnday. She is wheezing, has a minimally productive cough with white sputum, she is hoarse. Her BP is OK her temp is about 99.2. S/w Dr Julien Nordmann and instructed pt to see her PCP. Pt is in agreement with this plan.

## 2015-12-07 NOTE — Telephone Encounter (Signed)
After review with MD, returned call to pt instructed pt to take Pomalyst starting today. Pt verbalized understanding.

## 2015-12-07 NOTE — Telephone Encounter (Signed)
"  I'm calling as instructed by Dr. Worthy Flank nurse to call today about starting chemotherapy.  I've had a cold or something going on with increased temp = 99.3.  This morning temp = 97.7.  I take allergy and sinus medicine and a cough syrup from my family doctor.  I am to start the Pomalyst today.  Call and let me know what to do.  Am I to take the this week?"  Advised yes to take the weekly dexamethasone tomorrow.

## 2015-12-08 ENCOUNTER — Encounter: Payer: Self-pay | Admitting: Internal Medicine

## 2015-12-08 ENCOUNTER — Other Ambulatory Visit: Payer: Self-pay | Admitting: Internal Medicine

## 2015-12-08 ENCOUNTER — Ambulatory Visit (INDEPENDENT_AMBULATORY_CARE_PROVIDER_SITE_OTHER): Payer: Medicare Other | Admitting: Internal Medicine

## 2015-12-08 VITALS — BP 118/62 | HR 80 | Temp 97.9°F | Resp 16 | Ht 64.75 in | Wt 208.2 lb

## 2015-12-08 DIAGNOSIS — J0410 Acute tracheitis without obstruction: Secondary | ICD-10-CM

## 2015-12-08 DIAGNOSIS — J0100 Acute maxillary sinusitis, unspecified: Secondary | ICD-10-CM | POA: Diagnosis not present

## 2015-12-08 DIAGNOSIS — J45901 Unspecified asthma with (acute) exacerbation: Secondary | ICD-10-CM

## 2015-12-08 MED ORDER — LEVOFLOXACIN 500 MG PO TABS: ORAL_TABLET | ORAL | Status: DC

## 2015-12-08 MED ORDER — PREDNISONE 20 MG PO TABS: ORAL_TABLET | ORAL | Status: DC

## 2015-12-08 MED ORDER — BENZONATATE 200 MG PO CAPS: ORAL_CAPSULE | ORAL | Status: DC

## 2015-12-08 NOTE — Patient Instructions (Signed)
Anoro - take 1 inhalation daily

## 2015-12-08 NOTE — Progress Notes (Addendum)
Subjective:    Patient ID: [name], female    DOB: 02-Feb-1951, 65 y.o.   MRN: [id number]  HPI  This very nice, but unfortunate 65 yo DBF with MM in relapse recently re-initiated ChemoTx presents now with  A 5 day hx/o productive cough, wheezing. Reports chills, drenching nite sweats, and denies and sig CP or dyspnea.   Medication Sig  . ALPRAZolam (XANAX) 0.5 MG tablet TAKE 1/2 TO 1 TABLET 2 TO 3 TIMES DAILY AS NEEDED FOR ANXIETY  . aspirin 81 MG tablet Take 81 mg by mouth daily.  . brompheniramine-pseudoephedrine-DM 30-2-10 MG/5ML syrup Take 5 mLs by mouth 4 (four) times daily as needed.  . cholestyramine (QUESTRAN) 4 g packet Take 1 packet by mouth 2 (two) times daily. Please take 2-3 hours after taking your regular medications  . citalopram (CELEXA) 40 MG tablet Take 1 tablet (40 mg total) by mouth daily. (Patient taking differently: Take 20 mg by mouth daily. )  . cyclobenzaprine  10 MG tablet Take 1 tablet (10 mg total) by mouth 3 (three) times daily as needed for muscle spasms.  Marland Kitchen dexamethasone  4 MG tablet TAKE (10) TABLETS BY MOUTH EVERY FRIDAY  . LOMOTIL 2.5-0.025 MG tablet TAKE 2 TABLETS BY MOUTH 4 TIMES A DAY AS NEEDED FOR DIARRHEA  . erythromycin ophthalmic ointment Reported on 11/30/2015  . Eszopiclone 3 MG TABS Take 1 tablet (3 mg total) by mouth at bedtime as needed. for sleep  . furosemide 40 MG tablet TAKE 1 TABLET BY MOUTH TWICE A DAY FOR FLUID AND SWELLING  . gabapentin600 MG tablet TAKE 1 TABLET 4 X DAILY AS NEEDED FOR PAIN OR CRAMPS  . NORCO 7.5-325 MG tablet Take 1 tablet by mouth every 6 (six) hours as needed for moderate pain.  Marland Kitchen KLOR-CON 10 10 MEQ tablet TAKE 1 TABLET BY MOUTH TWICE A DAY  . levothyroxine  100 MCG tablet Take 100 mcg by mouth daily before breakfast.   . IMODIUM 2 MG capsule Take 2 mg by mouth as needed for diarrhea or loose stools. Reported on 10/02/2015  . NASONEX  nasal spray Place 2 sprays into the nose daily.  . montelukast 10 MG tablet TAKE  1 TABLET BY MOUTH EVERY DAY  . morphine (MS CONTIN) 15 MG 12 hr  Take 1 tablet (15 mg total) by mouth every 12 (twelve) hours.  . MULITIVIT w/ MINERALS Take 1 tablet by mouth every morning.   . ondansetron-ODT 8 MG Take 1 tablet (8 mg total) by mouth every 8 (eight) hours as needed for nausea or vomiting.  . pantoprazole (PROTONIX) 40 MG tablet TAKE 1 TABLET (40 MG TOTAL) BY MOUTH 2 (TWO) TIMES DAILY.  Marland Kitchen pomalidomide (POMALYST) 2 MG capsule Take 1 capsule (2 mg total) by mouth daily. Take with water on days 1-21. Repeat every 28 days.  . fexofenadine  60 MG tablet Take 1 tablet daily as needed for allergies  . Magnesium Hydroxide 400 MG CHEW Chew 2 tablets by mouth daily.       No Known Allergies   Past Medical History  Diagnosis Date  . Hypercholesterolemia   . Compression fracture 04/08/2007    pathologic compression fracture  . Hypothyroidism   . FHx: chemotherapy     s/p 5 cycle revlimid/low dose decadron,s/p velcade,doxil,decadron,  . Hx of radiation therapy 05/05/07-05/18/07,& 03/05/11-03/21/11-    l3&l5 in 2008, t2-t6 03/2011  . GERD (gastroesophageal reflux disease)   . Insomnia     associated with  steroids  . Constipation     takes oxycontin,vicodin  . Hx of radiation therapy 05/05/2007 to 05/18/2007    palliative, L3-5  . Hx of radiation therapy 03/05/2011 to 03/21/2011    palliative T2-T6, c-spine  . History of autologous stem cell transplant ([place]) 11/20/2007    [facility], Dr Valarie Merino  . PONV (postoperative nausea and vomiting)   . Multiple myeloma ([place]) dx'd 2009  . Metastasis to bone ([place])   . Family history of anesthesia complication     "daughter gets PONV too"  . Pneumonia     "several times"  . Stroke [facility]) 2014    denies residual on 01/27/2014   Past Surgical History  Procedure Laterality Date  . Knee arthroscopy Right     "put pin in"  . Knee arthroscopy Right     "took pin out and corrected what was wrong"  . Video bronchoscopy  07/30/2011    Procedure: VIDEO  BRONCHOSCOPY WITHOUT FLUORO;  Surgeon: Kathee Delton, MD  . Cholecystectomy  [phone]  . Portacath placement Right 2009  . Vaginal hysterectomy  1980's   Review of Systems  10 point systems review negative except as above.    Objective:   Physical Exam  BP 118/62 mmHg  Pulse 80  Temp(Src) 97.9 F (36.6 C)  Resp 16  Ht 5' 4.75" (1.645 m)  Wt 208 lb 3.2 oz (94.439 kg)  BMI 34.90 kg/m2  Brassy cough - no rash, resp stridor,  cyanosis, icterus.   HEENT - Eac's patent. TM's Nl. EOM's full. PERRLA. nBilat Maxillary tenderness.  NasoOroPharynx 1+ injected w/ no exudates.  Neck - supple. Nl Thyroid. Carotids 2+ & No bruits, nodes, JVD Chest -  Equal BS w/scattered coarse rales, rhonchi and forced post tussive exp wheezes. Cor - Nl HS. RRR w/o sig MGR. PP 1(+). No edema. MS- FROM w/o deformities. Muscle power, tone and bulk Nl. Gait Nl. Neuro - No obvious Cr N abnormalities. Sensory, motor and Cerebellar functions appear Nl w/o focal abnormalities.    Assessment & Plan:   1. Asthmatic bronchitis with acute exacerbation  - levofloxacin (LEVAQUIN) 500 MG tablet; Take 1 tablet daily with food for infection  Dispense: 15 tablet; Refill: 0 - predniSONE (DELTASONE) 20 MG tablet; 1 tab 3 x day for 3 days, then 1 tab 2 x day for 3 days, then 1 tab 1 x day for 5 days  Dispense: 20 tablet; Refill: 0 - benzonatate (TESSALON) 200 MG capsule; Take 1 perle 3 x/day to prevent cough  Dispense: 30 capsule; Refill: 1  2. Tracheitis  - levofloxacin (LEVAQUIN) 500 MG tablet; Take 1 tablet daily with food for infection  Dispense: 15 tablet; Refill: 0 - predniSONE (DELTASONE) 20 MG tablet; 1 tab 3 x day for 3 days, then 1 tab 2 x day for 3 days, then 1 tab 1 x day for 5 days  Dispense: 20 tablet; Refill: 0 - benzonatate (TESSALON) 200 MG capsule; Take 1 perle 3 x/day to prevent cough  Dispense: 30 capsule; Refill: 1  3. Acute maxillary sinusitis, recurrence not specified  - levofloxacin (LEVAQUIN) 500 MG  tablet; Take 1 tablet daily with food for infection  Dispense: 15 tablet; Refill: 0  - Discussed meds & SE's.

## 2015-12-11 ENCOUNTER — Other Ambulatory Visit: Payer: Self-pay | Admitting: Internal Medicine

## 2015-12-13 DIAGNOSIS — Z79899 Other long term (current) drug therapy: Secondary | ICD-10-CM | POA: Diagnosis not present

## 2015-12-13 DIAGNOSIS — C9000 Multiple myeloma not having achieved remission: Secondary | ICD-10-CM | POA: Diagnosis not present

## 2015-12-13 DIAGNOSIS — M4850XA Collapsed vertebra, not elsewhere classified, site unspecified, initial encounter for fracture: Secondary | ICD-10-CM | POA: Diagnosis not present

## 2015-12-15 ENCOUNTER — Telehealth: Payer: Self-pay | Admitting: *Deleted

## 2015-12-15 NOTE — Telephone Encounter (Signed)
Pt called states " my foot started swelling on Saturday and its gone down today." Pt reports taking a " fluid pill, watching salt intake and eats plenty of greens as she was told to do. Pt has no complaints at this time.

## 2015-12-16 ENCOUNTER — Other Ambulatory Visit: Payer: Self-pay | Admitting: Internal Medicine

## 2015-12-16 DIAGNOSIS — E039 Hypothyroidism, unspecified: Secondary | ICD-10-CM

## 2015-12-22 ENCOUNTER — Other Ambulatory Visit: Payer: Self-pay | Admitting: Internal Medicine

## 2015-12-25 ENCOUNTER — Other Ambulatory Visit: Payer: Self-pay | Admitting: Internal Medicine

## 2015-12-26 ENCOUNTER — Other Ambulatory Visit: Payer: Self-pay | Admitting: Internal Medicine

## 2015-12-26 NOTE — Telephone Encounter (Signed)
Rx called into [facility]. 

## 2015-12-27 ENCOUNTER — Other Ambulatory Visit: Payer: Self-pay | Admitting: *Deleted

## 2015-12-27 DIAGNOSIS — C9002 Multiple myeloma in relapse: Secondary | ICD-10-CM

## 2015-12-28 ENCOUNTER — Other Ambulatory Visit: Payer: Self-pay

## 2015-12-28 ENCOUNTER — Ambulatory Visit (HOSPITAL_BASED_OUTPATIENT_CLINIC_OR_DEPARTMENT_OTHER): Payer: Medicare Other | Admitting: Internal Medicine

## 2015-12-28 ENCOUNTER — Ambulatory Visit (HOSPITAL_BASED_OUTPATIENT_CLINIC_OR_DEPARTMENT_OTHER): Payer: Medicare Other

## 2015-12-28 ENCOUNTER — Other Ambulatory Visit (HOSPITAL_BASED_OUTPATIENT_CLINIC_OR_DEPARTMENT_OTHER): Payer: Medicare Other

## 2015-12-28 VITALS — BP 120/62 | HR 77 | Temp 98.0°F | Resp 18

## 2015-12-28 VITALS — BP 134/66 | HR 83 | Temp 98.5°F | Resp 18 | Ht 64.75 in | Wt 210.7 lb

## 2015-12-28 DIAGNOSIS — R51 Headache: Secondary | ICD-10-CM | POA: Diagnosis not present

## 2015-12-28 DIAGNOSIS — C9002 Multiple myeloma in relapse: Secondary | ICD-10-CM

## 2015-12-28 DIAGNOSIS — R42 Dizziness and giddiness: Secondary | ICD-10-CM

## 2015-12-28 DIAGNOSIS — Z452 Encounter for adjustment and management of vascular access device: Secondary | ICD-10-CM | POA: Diagnosis not present

## 2015-12-28 DIAGNOSIS — Z5111 Encounter for antineoplastic chemotherapy: Secondary | ICD-10-CM

## 2015-12-28 DIAGNOSIS — M899 Disorder of bone, unspecified: Secondary | ICD-10-CM

## 2015-12-28 LAB — COMPREHENSIVE METABOLIC PANEL
ALT: 18 U/L (ref 0–55)
ANION GAP: 8 meq/L (ref 3–11)
AST: 15 U/L (ref 5–34)
Albumin: 3.1 g/dL — ABNORMAL LOW (ref 3.5–5.0)
Alkaline Phosphatase: 74 U/L (ref 40–150)
BUN: 4.9 mg/dL — ABNORMAL LOW (ref 7.0–26.0)
CHLORIDE: 105 meq/L (ref 98–109)
CO2: 26 meq/L (ref 22–29)
Calcium: 8.9 mg/dL (ref 8.4–10.4)
Creatinine: 0.8 mg/dL (ref 0.6–1.1)
GLUCOSE: 95 mg/dL (ref 70–140)
POTASSIUM: 3.6 meq/L (ref 3.5–5.1)
SODIUM: 139 meq/L (ref 136–145)
Total Bilirubin: 0.83 mg/dL (ref 0.20–1.20)
Total Protein: 6.8 g/dL (ref 6.4–8.3)

## 2015-12-28 LAB — CBC WITH DIFFERENTIAL/PLATELET
BASO%: 1.5 % (ref 0.0–2.0)
BASOS ABS: 0 10*3/uL (ref 0.0–0.1)
EOS%: 5.3 % (ref 0.0–7.0)
Eosinophils Absolute: 0.1 10*3/uL (ref 0.0–0.5)
HEMATOCRIT: 32.3 % — AB (ref 34.8–46.6)
HGB: 10.6 g/dL — ABNORMAL LOW (ref 11.6–15.9)
LYMPH#: 1 10*3/uL (ref 0.9–3.3)
LYMPH%: 47 % (ref 14.0–49.7)
MCH: 34 pg (ref 25.1–34.0)
MCHC: 32.8 g/dL (ref 31.5–36.0)
MCV: 103.7 fL — ABNORMAL HIGH (ref 79.5–101.0)
MONO#: 0.2 10*3/uL (ref 0.1–0.9)
MONO%: 8.7 % (ref 0.0–14.0)
NEUT#: 0.8 10*3/uL — ABNORMAL LOW (ref 1.5–6.5)
NEUT%: 37.5 % — AB (ref 38.4–76.8)
PLATELETS: 166 10*3/uL (ref 145–400)
RBC: 3.11 10*6/uL — AB (ref 3.70–5.45)
RDW: 17.4 % — ABNORMAL HIGH (ref 11.2–14.5)
WBC: 2.2 10*3/uL — ABNORMAL LOW (ref 3.9–10.3)

## 2015-12-28 LAB — LACTATE DEHYDROGENASE: LDH: 175 U/L (ref 125–245)

## 2015-12-28 MED ORDER — SODIUM CHLORIDE 0.9 % IJ SOLN
10.0000 mL | Freq: Once | INTRAMUSCULAR | Status: AC
  Administered 2015-12-28: 10 mL
  Filled 2015-12-28: qty 10

## 2015-12-28 MED ORDER — HEPARIN SOD (PORK) LOCK FLUSH 100 UNIT/ML IV SOLN
500.0000 [IU] | Freq: Once | INTRAVENOUS | Status: AC
  Administered 2015-12-28: 500 [IU]
  Filled 2015-12-28: qty 5

## 2015-12-28 NOTE — Progress Notes (Signed)
[place] Telephone:([phone]) [phone]   [phone]) [place], [place] [address]  DIAGNOSIS: Recurrent multiple myeloma initially diagnosed in October 2008.   PRIOR THERAPY:  1. Status post palliative radiotherapy to the lumbar spine between L3 and L5. The patient received a total dose of 3000 cGy in 10 fractions under the care of Dr. Lisbeth Renshaw between May 05, 2007 through May 18, 2007. 2. Status post 5 cycles of systemic chemotherapy with Revlimid and low-dose Decadron with good response to this treatment. 3. Status post autologous peripheral blood stem cell transplant at [facility] on Nov 20, 2007 under the care of Dr. Valarie Merino. 4. Status post treatment for disease recurrence with Velcade, Doxil and Decadron. Last dose given Nov 09, 2009. Discontinued secondary to intolerance but the patient had a good response to treatment at that time. 5. Status post palliative radiotherapy to the T2-T6 thoracic vertebrae completed 03/21/2011 under the care of Dr. Lisbeth Renshaw. 6. Systemic chemotherapy with Velcade 1.3 mg per meter squared given on days 1, 4, 8 and 11, and Doxil at 30 mg per meter squared given on day 4 in addition to Decadron status post 4 cycles, discontinued secondary to intolerance. 7. Systemic therapy with Velcade 1.3 mg/M2 subcutaneously in addition to Decadron 40 mg by mouth on a weekly basis, status post 20 cycles. The patient had good response with this treatment but it is discontinue today secondary to worsening peripheral neuropathy. 8. Palliative radiotherapy to the skull lesion as well as the left hip area under the care of Dr. Lisbeth Renshaw. 9. Systemic chemotherapy with Carfilzomib 20 mg/M2 on days 1, 2,  8, 9, 13 and 16 every 4 weeks in addition to weekly Decadron 40 mg by mouth. First dose on 04/19/2013. Status post 4 cycles, discontinued recently secondary to cardiac  dysfunction.  CURRENT THERAPY:  1. Pomalyst 3 mg by mouth daily for 21 days every 4 weeks in addition to dexamethasone 40 mg on a weekly basis. First dose started 12/02/2013. Status post 22 cycles. She will start cycle #26 on 01/04/2016. Her dose of Pomalyst will be reduced to 2 mg by mouth daily for 21 days every 4 weeks starting from cycle 23.  2. Zometa 4 mg IV given every 3 months.   INTERVAL HISTORY: [name] 65 y.o. female returns to the clinic today for follow up visit accompanied by her daughter. The patient is feeling fine today except for intermittent episodes of dizziness and headache. The patient denied having any significant chest pain, shortness of breath, or hemoptysis. She has no nausea or vomiting. She has no weight loss or night sweats. She continues to have mild peripheral neuropathy and currently on gabapentin. She also has back pain and was seen in the past by Dr. Joya Salm for evaluation of her condition. He recommended for her surgical intervention but the patient is still thinking about this option. She was also seen recently by Dr. Evelene Croon at [facility]. She was advised to hold her treatment with Decadron for few weeks because of her symptoms. The patient did not notice any difference in her symptoms after discontinuing Decadron. She would like to resume it again. She had repeat CBC and comprehensive metabolic panel performed earlier today and she is here for evaluation and discussion of her lab results.  MEDICAL HISTORY: Past Medical History  Diagnosis Date  . Hypercholesterolemia   . Compression fracture 04/08/2007  pathologic compression fracture  . Hypothyroidism   . FHx: chemotherapy     s/p 5 cycle revlimid/low dose decadron,s/p velcade,doxil,decadron,  . Hx of radiation therapy 05/05/07-05/18/07,& 03/05/11-03/21/11-    l3&l5 in 2008, t2-t6 03/2011  . GERD (gastroesophageal reflux disease)   . Insomnia     associated with steroids  . Constipation      takes oxycontin,vicodin  . Hx of radiation therapy 05/05/2007 to 05/18/2007    palliative, L3-5  . Hx of radiation therapy 03/05/2011 to 03/21/2011    palliative T2-T6, c-spine  . History of autologous stem cell transplant ([place]) 11/20/2007    [facility], Dr Valarie Merino  . PONV (postoperative nausea and vomiting)   . Multiple myeloma ([place]) dx'd 2009  . Metastasis to bone ([place])   . Family history of anesthesia complication     "daughter gets PONV too"  . Pneumonia     "several times"  . Stroke [facility]) 2014    denies residual on 01/27/2014    ALLERGIES:  has No Known Allergies.  MEDICATIONS:  Current Outpatient Prescriptions  Medication Sig Dispense Refill  . ALPRAZolam (XANAX) 0.5 MG tablet TAKE 1/2 TO 1 TABLET 2 TO 3 TIMES DAILY AS NEEDED FOR ANXIETY 270 tablet 1  . aspirin 81 MG tablet Take 81 mg by mouth daily.    . benzonatate (TESSALON) 200 MG capsule Take 1 perle 3 x/day to prevent cough 30 capsule 1  . brompheniramine-pseudoephedrine-DM 30-2-10 MG/5ML syrup Take 5 mLs by mouth 4 (four) times daily as needed. 473 mL 2  . cholestyramine (QUESTRAN) 4 g packet Take 1 packet by mouth 2 (two) times daily. Please take 2-3 hours after taking your regular medications 60 each 11  . citalopram (CELEXA) 40 MG tablet TAKE 1 TABLET (40 MG TOTAL) BY MOUTH DAILY. 90 tablet 1  . citalopram (CELEXA) 40 MG tablet TAKE 1 TABLET (40 MG TOTAL) BY MOUTH DAILY. 90 tablet 2  . cyclobenzaprine (FLEXERIL) 10 MG tablet Take 1 tablet (10 mg total) by mouth 3 (three) times daily as needed for muscle spasms. 90 tablet 3  . dexamethasone (DECADRON) 4 MG tablet TAKE (10) TABLETS BY MOUTH EVERY FRIDAY 40 tablet 1  . DIPHENHIST 12.5 MG/5ML liquid Reported on 12/08/2015    . diphenoxylate-atropine (LOMOTIL) 2.5-0.025 MG tablet TAKE 2 TABLETS BY MOUTH 4 TIMES A DAY AS NEEDED FOR DIARRHEA 30 tablet 1  . erythromycin ophthalmic ointment Reported on 11/30/2015    . Eszopiclone 3 MG TABS TAKE 1 TABLET BY MOUTH AT BEDTIME AS NEEDED FOR  SLEEP 30 tablet 2  . furosemide (LASIX) 40 MG tablet TAKE 1 TABLET BY MOUTH TWICE A DAY FOR FLUID AND SWELLING 60 tablet 3  . gabapentin (NEURONTIN) 600 MG tablet TAKE 1 TABLET 4 X DAILY AS NEEDED FOR PAIN OR CRAMPS 120 tablet 4  . HYDROcodone-acetaminophen (NORCO) 7.5-325 MG tablet Take 1 tablet by mouth every 6 (six) hours as needed for moderate pain. 30 tablet 0  . KLOR-CON 10 10 MEQ tablet TAKE 1 TABLET BY MOUTH TWICE A DAY 180 tablet 1  . levofloxacin (LEVAQUIN) 500 MG tablet Take 1 tablet daily with food for infection 15 tablet 0  . levothyroxine (SYNTHROID, LEVOTHROID) 100 MCG tablet Take 100 mcg by mouth daily before breakfast.     . loperamide (IMODIUM) 2 MG capsule Take 2 mg by mouth as needed for diarrhea or loose stools. Reported on 10/02/2015    . Magnesium 500 MG TABS Take 1 tablet by mouth daily.    Marland Kitchen  metolazone (ZAROXOLYN) 2.5 MG tablet Please take 1 tablet on Wednesday and Sunday in the morning for swelling. 30 tablet 1  . mometasone (NASONEX) 50 MCG/ACT nasal spray Place 2 sprays into the nose daily. 17 g 2  . montelukast (SINGULAIR) 10 MG tablet TAKE 1 TABLET BY MOUTH EVERY DAY 30 tablet 2  . montelukast (SINGULAIR) 10 MG tablet TAKE 1 TABLET BY MOUTH EVERY DAY 30 tablet 2  . morphine (MS CONTIN) 15 MG 12 hr tablet Take 1 tablet (15 mg total) by mouth every 12 (twelve) hours. 60 tablet 0  . Multiple Vitamin (MULITIVITAMIN WITH MINERALS) TABS Take 1 tablet by mouth every morning.     . nystatin (MYCOSTATIN) 100000 UNIT/ML suspension Gargle 5 mL PO q6hours and spit out after 20-30 seconds.  Use for 1 week 180 mL 0  . ondansetron (ZOFRAN-ODT) 8 MG disintegrating tablet Take 1 tablet (8 mg total) by mouth every 8 (eight) hours as needed for nausea or vomiting. 30 tablet 2  . ondansetron (ZOFRAN-ODT) 8 MG disintegrating tablet TAKE 1 TABLET BY MOUTH EVERY 8 HOURS AS NEEDED FOR NAUSEA AND VOMITING 30 tablet 1  . pantoprazole (PROTONIX) 40 MG tablet TAKE 1 TABLET (40 MG TOTAL) BY MOUTH 2  (TWO) TIMES DAILY. 60 tablet 5  . pomalidomide (POMALYST) 2 MG capsule Take 1 capsule (2 mg total) by mouth daily. Take with water on days 1-21. Repeat every 28 days. 21 capsule 0  . predniSONE (DELTASONE) 20 MG tablet 1 tab 3 x day for 3 days, then 1 tab 2 x day for 3 days, then 1 tab 1 x day for 5 days 20 tablet 0  . SYNTHROID 100 MCG tablet Take  Tablet daily on an empty stomach for 30 minutes 90 tablet 1   No current facility-administered medications for this visit.    SURGICAL HISTORY:  Past Surgical History  Procedure Laterality Date  . Knee arthroscopy Right     "put pin in"  . Knee arthroscopy Right     "took pin out and corrected what was wrong"  . Video bronchoscopy  07/30/2011    Procedure: VIDEO BRONCHOSCOPY WITHOUT FLUORO;  Surgeon: Kathee Delton, MD;  Location: [facility];  Service: Cardiopulmonary;  Laterality: Bilateral;  . Cholecystectomy  1980's  . Portacath placement Right 2009  . Vaginal hysterectomy  1980's    REVIEW OF SYSTEMS:  Constitutional: positive for fatigue Eyes: negative Ears, nose, mouth, throat, and face: negative Respiratory: negative Cardiovascular: negative Gastrointestinal: positive for diarrhea Genitourinary:negative Integument/breast: negative Hematologic/lymphatic: negative Musculoskeletal:positive for back pain Neurological: positive for dizziness and headaches Behavioral/Psych: negative Endocrine: negative Allergic/Immunologic: negative   PHYSICAL EXAMINATION: General appearance: alert, cooperative, fatigued and no distress Head: Normocephalic, without obvious abnormality, atraumatic Neck: no adenopathy, no JVD, supple, symmetrical, trachea midline and thyroid not enlarged, symmetric, no tenderness/mass/nodules Lymph nodes: Cervical, supraclavicular, and axillary nodes normal. Resp: clear to auscultation bilaterally Back: symmetric, no curvature. ROM normal. No CVA tenderness. Cardio: regular rate and rhythm, S1, S2 normal, no  murmur, click, rub or gallop GI: soft, non-tender; bowel sounds normal; no masses,  no organomegaly Extremities: edema 1+ edema of left lower extremity. Neurologic: Alert and oriented X 3, normal strength and tone. Normal symmetric reflexes. Normal coordination and gait  ECOG PERFORMANCE STATUS: 1 - Symptomatic but completely ambulatory  Blood pressure 134/66, pulse 83, temperature 98.5 F (36.9 C), temperature source Oral, resp. rate 18, height 5' 4.75" (1.645 m), weight 210 lb 11.2 oz (95.573 kg), SpO2 98 %.  LABORATORY DATA: Lab Results  Component Value Date   WBC 2.2* 12/28/2015   HGB 10.6* 12/28/2015   HCT 32.3* 12/28/2015   MCV 103.7* 12/28/2015   PLT 166 12/28/2015      Chemistry      Component Value Date/Time   NA 139 12/28/2015 1055   NA 134* 10/03/2015 1147   K 3.6 12/28/2015 1055   K 4.2 10/03/2015 1147   CL 99 10/03/2015 1147   CL 105 12/17/2012 1015   CO2 26 12/28/2015 1055   CO2 29 10/03/2015 1147   BUN 4.9* 12/28/2015 1055   BUN 20 10/03/2015 1147   CREATININE 0.8 12/28/2015 1055   CREATININE 0.89 10/03/2015 1147   CREATININE 0.82 05/05/2015 1400      Component Value Date/Time   CALCIUM 8.9 12/28/2015 1055   CALCIUM 9.2 10/03/2015 1147   ALKPHOS 74 12/28/2015 1055   ALKPHOS 56 10/03/2015 1147   AST 15 12/28/2015 1055   AST 12 10/03/2015 1147   ALT 18 12/28/2015 1055   ALT 27 10/03/2015 1147   BILITOT 0.83 12/28/2015 1055   BILITOT 0.8 10/03/2015 1147     Myeloma panel Performed 11/30/2015 showed beta 2 microglobulin 2.7, IgG 1136, IgA 105 and IgM 12. Free kappa light chain 26.97, free lambda light chain 137.72 and a kappa/lambda ratio 0.20  RADIOGRAPHIC STUDIES: No results found. ASSESSMENT AND PLAN:  This is a very pleasant 65 years old [place] American female with recurrent multiple myeloma recently completed a course of treatment with Velcade and Decadron with improvement in her disease but this was discontinued secondary to peripheral  neuropathy. The patient tolerated her treatment with Carfilzomib and Decadron fairly well except for the recent shortness breath and cough which was felt to be secondary to congestive heart failure from her treatment with Carfilzomib. This treatment was discontinued. She was started on treatment with Pomalyst and Decadron status post 24 cycles. She tolerated the last cycle of her treatment well. The myeloma panel is still pending. I recommended for the patient to continue her treatment with Pomalyst at reduced dose of 2 mg. The most recent myeloma panel showed mild increase of the free lambda light chain.  I recommended for the patient to continue her current treatment with Pomalyst and Decadron as a scheduled. She will start cycle #27 next week. 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 I would see the patient back for follow-up visit in one month for reevaluation with CBC, comprehensive metabolic panel and LDH for evaluation of her disease. For the myeloma panel bone lesions, the patient will continue her current treatment with Zometa every 3 months as scheduled.  For the back pain and recent finding on MRI of the lumbar spines, she was seen by Dr. Joya Salm and he recommended surgical intervention for her but the patient is still reluctant about this option.Marland Kitchen She was advised to call immediately if she has any concerning symptoms in the interval. The patient voices understanding of current disease status and treatment options and is in agreement with the current care plan.  All questions were answered. The patient knows to call the clinic with any problems, questions or concerns. We can certainly see the patient much sooner if necessary.  Disclaimer: This note was dictated with voice recognition software. Similar sounding words can inadvertently be transcribed and may be missed  upon review.

## 2015-12-29 ENCOUNTER — Encounter: Payer: Self-pay | Admitting: Internal Medicine

## 2015-12-29 DIAGNOSIS — R42 Dizziness and giddiness: Secondary | ICD-10-CM | POA: Insufficient documentation

## 2016-01-01 ENCOUNTER — Other Ambulatory Visit: Payer: Self-pay | Admitting: *Deleted

## 2016-01-01 DIAGNOSIS — C9002 Multiple myeloma in relapse: Secondary | ICD-10-CM

## 2016-01-01 MED ORDER — POMALIDOMIDE 2 MG PO CAPS: 2.0000 mg | ORAL_CAPSULE | Freq: Every day | ORAL | Status: DC

## 2016-01-03 ENCOUNTER — Other Ambulatory Visit: Payer: Self-pay | Admitting: Internal Medicine

## 2016-01-03 ENCOUNTER — Other Ambulatory Visit: Payer: Self-pay

## 2016-01-03 DIAGNOSIS — M4856XA Collapsed vertebra, not elsewhere classified, lumbar region, initial encounter for fracture: Secondary | ICD-10-CM | POA: Diagnosis not present

## 2016-01-03 DIAGNOSIS — S32040D Wedge compression fracture of fourth lumbar vertebra, subsequent encounter for fracture with routine healing: Secondary | ICD-10-CM | POA: Diagnosis not present

## 2016-01-03 DIAGNOSIS — M542 Cervicalgia: Secondary | ICD-10-CM | POA: Diagnosis not present

## 2016-01-03 DIAGNOSIS — C9000 Multiple myeloma not having achieved remission: Secondary | ICD-10-CM | POA: Diagnosis not present

## 2016-01-03 DIAGNOSIS — M4850XA Collapsed vertebra, not elsewhere classified, site unspecified, initial encounter for fracture: Secondary | ICD-10-CM | POA: Diagnosis not present

## 2016-01-03 DIAGNOSIS — M5136 Other intervertebral disc degeneration, lumbar region: Secondary | ICD-10-CM | POA: Diagnosis not present

## 2016-01-03 DIAGNOSIS — C9001 Multiple myeloma in remission: Secondary | ICD-10-CM | POA: Diagnosis not present

## 2016-01-03 DIAGNOSIS — M47812 Spondylosis without myelopathy or radiculopathy, cervical region: Secondary | ICD-10-CM | POA: Diagnosis not present

## 2016-01-03 MED ORDER — FUROSEMIDE 40 MG PO TABS: ORAL_TABLET | ORAL | Status: DC

## 2016-01-03 MED ORDER — GABAPENTIN 600 MG PO TABS: ORAL_TABLET | ORAL | Status: DC

## 2016-01-04 ENCOUNTER — Emergency Department (HOSPITAL_COMMUNITY)
Admission: EM | Admit: 2016-01-04 | Discharge: 2016-01-04 | Disposition: A | Discharge status: Home / Self Care | Payer: Medicare Other | Attending: Emergency Medicine | Admitting: Emergency Medicine

## 2016-01-04 ENCOUNTER — Encounter (HOSPITAL_COMMUNITY): Payer: Self-pay

## 2016-01-04 ENCOUNTER — Telehealth: Payer: Self-pay | Admitting: *Deleted

## 2016-01-04 ENCOUNTER — Other Ambulatory Visit: Payer: Self-pay

## 2016-01-04 ENCOUNTER — Ambulatory Visit (HOSPITAL_COMMUNITY)
Admission: RE | Admit: 2016-01-04 | Discharge: 2016-01-04 | Disposition: A | Discharge status: Home / Self Care | Payer: Medicare Other | Source: Ambulatory Visit | Attending: Internal Medicine | Admitting: Internal Medicine

## 2016-01-04 DIAGNOSIS — Z5111 Encounter for antineoplastic chemotherapy: Secondary | ICD-10-CM

## 2016-01-04 DIAGNOSIS — Z79899 Other long term (current) drug therapy: Secondary | ICD-10-CM | POA: Insufficient documentation

## 2016-01-04 DIAGNOSIS — Z7982 Long term (current) use of aspirin: Secondary | ICD-10-CM | POA: Insufficient documentation

## 2016-01-04 DIAGNOSIS — Z8719 Personal history of other diseases of the digestive system: Secondary | ICD-10-CM | POA: Diagnosis not present

## 2016-01-04 DIAGNOSIS — D649 Anemia, unspecified: Secondary | ICD-10-CM | POA: Diagnosis not present

## 2016-01-04 DIAGNOSIS — Z87891 Personal history of nicotine dependence: Secondary | ICD-10-CM | POA: Insufficient documentation

## 2016-01-04 DIAGNOSIS — C9002 Multiple myeloma in relapse: Secondary | ICD-10-CM

## 2016-01-04 DIAGNOSIS — Z8673 Personal history of transient ischemic attack (TIA), and cerebral infarction without residual deficits: Secondary | ICD-10-CM | POA: Insufficient documentation

## 2016-01-04 DIAGNOSIS — E876 Hypokalemia: Secondary | ICD-10-CM | POA: Insufficient documentation

## 2016-01-04 DIAGNOSIS — E039 Hypothyroidism, unspecified: Secondary | ICD-10-CM | POA: Insufficient documentation

## 2016-01-04 DIAGNOSIS — K625 Hemorrhage of anus and rectum: Secondary | ICD-10-CM | POA: Diagnosis not present

## 2016-01-04 DIAGNOSIS — C439 Malignant melanoma of skin, unspecified: Secondary | ICD-10-CM | POA: Diagnosis not present

## 2016-01-04 LAB — CBC
HEMATOCRIT: 29.4 % — AB (ref 36.0–46.0)
HEMOGLOBIN: 9.8 g/dL — AB (ref 12.0–15.0)
MCH: 34.3 pg — AB (ref 26.0–34.0)
MCHC: 33.3 g/dL (ref 30.0–36.0)
MCV: 102.8 fL — ABNORMAL HIGH (ref 78.0–100.0)
Platelets: 228 10*3/uL (ref 150–400)
RBC: 2.86 MIL/uL — ABNORMAL LOW (ref 3.87–5.11)
RDW: 15.6 % — ABNORMAL HIGH (ref 11.5–15.5)
WBC: 3.1 10*3/uL — ABNORMAL LOW (ref 4.0–10.5)

## 2016-01-04 LAB — COMPREHENSIVE METABOLIC PANEL
ALBUMIN: 3.6 g/dL (ref 3.5–5.0)
ALK PHOS: 58 U/L (ref 38–126)
ALT: 16 U/L (ref 14–54)
ANION GAP: 7 (ref 5–15)
AST: 18 U/L (ref 15–41)
BUN: 8 mg/dL (ref 6–20)
CALCIUM: 8 mg/dL — AB (ref 8.9–10.3)
CO2: 26 mmol/L (ref 22–32)
Chloride: 106 mmol/L (ref 101–111)
Creatinine, Ser: 0.76 mg/dL (ref 0.44–1.00)
GFR calc Af Amer: >60 mL/min (ref >60)
GFR calc non Af Amer: >60 mL/min (ref >60)
GLUCOSE: 92 mg/dL (ref 65–99)
POTASSIUM: 2.7 mmol/L — AB (ref 3.5–5.1)
SODIUM: 139 mmol/L (ref 135–145)
Total Bilirubin: 1.1 mg/dL (ref 0.3–1.2)
Total Protein: 6.8 g/dL (ref 6.5–8.1)

## 2016-01-04 LAB — MAGNESIUM: MAGNESIUM: 1.3 mg/dL — AB (ref 1.7–2.4)

## 2016-01-04 LAB — POC OCCULT BLOOD, ED: Fecal Occult Bld: NEGATIVE

## 2016-01-04 LAB — TYPE AND SCREEN
ABO/RH(D): O POS
Antibody Screen: NEGATIVE

## 2016-01-04 MED ORDER — HEPARIN SOD (PORK) LOCK FLUSH 100 UNIT/ML IV SOLN
500.0000 [IU] | Freq: Once | INTRAVENOUS | Status: AC
  Administered 2016-01-04: 500 [IU]
  Filled 2016-01-04: qty 5

## 2016-01-04 MED ORDER — POTASSIUM CHLORIDE CRYS ER 20 MEQ PO TBCR
40.0000 meq | EXTENDED_RELEASE_TABLET | Freq: Once | ORAL | Status: AC
  Administered 2016-01-04: 40 meq via ORAL
  Filled 2016-01-04: qty 2

## 2016-01-04 MED ORDER — OXYCODONE-ACETAMINOPHEN 5-325 MG PO TABS
1.0000 | ORAL_TABLET | Freq: Once | ORAL | Status: AC
  Administered 2016-01-04: 1 via ORAL
  Filled 2016-01-04: qty 1

## 2016-01-04 MED ORDER — PANTOPRAZOLE SODIUM 40 MG PO TBEC: DELAYED_RELEASE_TABLET | ORAL | Status: AC

## 2016-01-04 MED ORDER — GADOBENATE DIMEGLUMINE 529 MG/ML IV SOLN
20.0000 mL | Freq: Once | INTRAVENOUS | Status: AC | PRN
  Administered 2016-01-04: 20 mL via INTRAVENOUS

## 2016-01-04 MED ORDER — ONDANSETRON 8 MG PO TBDP
8.0000 mg | ORAL_TABLET | Freq: Once | ORAL | Status: AC
  Administered 2016-01-04: 8 mg via ORAL
  Filled 2016-01-04: qty 1

## 2016-01-04 NOTE — Telephone Encounter (Signed)
Patient called stating that she has been having diarrhea on and off for a while. Patient states that last night she noticed a pink tinge on there tissue after her bowel movement. Patient noticed today after her bowel movement that she had a copious amount of blood in her stool (enough to cover the bowel) to the point that she was no longer having diarrhea but mainly blood was coming out. Informed patient that she should go the ED to be evaluated and patient verbalized understanding. Message sent to MD [facility].

## 2016-01-04 NOTE — Discharge Instructions (Signed)
Take 20 mEq of potassium 3 times a day for 1 week. You will need repeat laboratory testing in one or 2 days. Try to eat foods which contain more potassium. Return here, if needed, for problems.    Hypokalemia Hypokalemia means that the amount of potassium in the blood is lower than normal.Potassium is a chemical, called an electrolyte, that helps regulate the amount of fluid in the body. It also stimulates muscle contraction and helps nerves function properly.Most of the body's potassium is inside of cells, and only a very small amount is in the blood. Because the amount in the blood is so small, minor changes can be life-threatening. CAUSES  Antibiotics.  Diarrhea or vomiting.  Using laxatives too much, which can cause diarrhea.  Chronic kidney disease.  Water pills (diuretics).  Eating disorders (bulimia).  Low magnesium level.  Sweating a lot. SIGNS AND SYMPTOMS  Weakness.  Constipation.  Fatigue.  Muscle cramps.  Mental confusion.  Skipped heartbeats or irregular heartbeat (palpitations).  Tingling or numbness. DIAGNOSIS  Your health care provider can diagnose hypokalemia with blood tests. In addition to checking your potassium level, your health care provider may also check other lab tests. TREATMENT Hypokalemia can be treated with potassium supplements taken by mouth or adjustments in your current medicines. If your potassium level is very low, you may need to get potassium through a vein (IV) and be monitored in the hospital. A diet high in potassium is also helpful. Foods high in potassium are:  Nuts, such as peanuts and pistachios.  Seeds, such as sunflower seeds and pumpkin seeds.  Peas, lentils, and lima beans.  Whole grain and bran cereals and breads.  Fresh fruit and vegetables, such as apricots, avocado, bananas, cantaloupe, kiwi, oranges, tomatoes, asparagus, and potatoes.  Orange and tomato juices.  Red meats.  Fruit yogurt. HOME CARE  INSTRUCTIONS  Take all medicines as prescribed by your health care provider.  Maintain a healthy diet by including nutritious food, such as fruits, vegetables, nuts, whole grains, and lean meats.  If you are taking a laxative, be sure to follow the directions on the label. SEEK MEDICAL CARE IF:  Your weakness gets worse.  You feel your heart pounding or racing.  You are vomiting or having diarrhea.  You are diabetic and having trouble keeping your blood glucose in the normal range. SEEK IMMEDIATE MEDICAL CARE IF:  You have chest pain, shortness of breath, or dizziness.  You are vomiting or having diarrhea for more than 2 days.  You faint. MAKE SURE YOU:   Understand these instructions.  Will watch your condition.  Will get help right away if you are not doing well or get worse.   This information is not intended to replace advice given to you by your health care provider. Make sure you discuss any questions you have with your health care provider.   Document Released: 06/17/2005 Document Revised: 07/08/2014 Document Reviewed: 12/18/2012 Elsevier Interactive Patient Education 2016 [employer].  Potassium Content of Foods Potassium is a mineral found in many foods and drinks. It helps keep fluids and minerals balanced in your body and affects how steadily your heart beats. Potassium also helps control your blood pressure and keep your muscles and nervous system healthy. Certain health conditions and medicines may change the balance of potassium in your body. When this happens, you can help balance your level of potassium through the foods that you do or do not eat. Your health care provider or dietitian may  recommend an amount of potassium that you should have each day. The following lists of foods provide the amount of potassium (in parentheses) per serving in each item. HIGH IN POTASSIUM  The following foods and beverages have 200 mg or more of potassium per  serving:  Apricots, 2 raw or 5 dry (200 mg).  Artichoke, 1 medium (345 mg).  Avocado, raw,  each (245 mg).  Banana, 1 medium (425 mg).  Beans, lima, or baked beans, canned,  cup (280 mg).  Beans, white, canned,  cup (595 mg).  Beef roast, 3 oz (320 mg).  Beef, ground, 3 oz (270 mg).  Beets, raw or cooked,  cup (260 mg).  Bran muffin, 2 oz (300 mg).  Broccoli,  cup (230 mg).  Brussels sprouts,  cup (250 mg).  Cantaloupe,  cup (215 mg).  Cereal, 100% bran,  cup (200-400 mg).  Cheeseburger, single, fast food, 1 each (225-400 mg).  Chicken, 3 oz (220 mg).  Clams, canned, 3 oz (535 mg).  Crab, 3 oz (225 mg).  Dates, 5 each (270 mg).  Dried beans and peas,  cup (300-475 mg).  Figs, dried, 2 each (260 mg).  Fish: halibut, tuna, cod, snapper, 3 oz (480 mg).  Fish: salmon, haddock, swordfish, perch, 3 oz (300 mg).  Fish, tuna, canned 3 oz (200 mg).  [place] fries, fast food, 3 oz (470 mg).  Granola with fruit and nuts,  cup (200 mg).  Grapefruit juice,  cup (200 mg).  Greens, beet,  cup (655 mg).  Honeydew melon,  cup (200 mg).  Kale, raw, 1 cup (300 mg).  Kiwi, 1 medium (240 mg).  Kohlrabi, rutabaga, parsnips,  cup (280 mg).  Lentils,  cup (365 mg).  Mango, 1 each (325 mg).  Milk, chocolate, 1 cup (420 mg).  Milk: nonfat, low-fat, whole, buttermilk, 1 cup (350-380 mg).  Molasses, 1 Tbsp (295 mg).  Mushrooms,  cup (280) mg.  Nectarine, 1 each (275 mg).  Nuts: almonds, peanuts, hazelnuts, [place], cashew, mixed, 1 oz (200 mg).  Nuts, pistachios, 1 oz (295 mg).  Orange, 1 each (240 mg).  Orange juice,  cup (235 mg).  Papaya, medium,  fruit (390 mg).  Peanut butter, chunky, 2 Tbsp (240 mg).  Peanut butter, smooth, 2 Tbsp (210 mg).  Pear, 1 medium (200 mg).  Pomegranate, 1 whole (400 mg).  Pomegranate juice,  cup (215 mg).  Pork, 3 oz (350 mg).  Potato chips, salted, 1 oz (465 mg).  Potato, baked with skin, 1  medium (925 mg).  Potatoes, boiled,  cup (255 mg).  Potatoes, mashed,  cup (330 mg).  Prune juice,  cup (370 mg).  Prunes, 5 each (305 mg).  Pudding, chocolate,  cup (230 mg).  Pumpkin, canned,  cup (250 mg).  Raisins, seedless,  cup (270 mg).  Seeds, sunflower or pumpkin, 1 oz (240 mg).  Soy milk, 1 cup (300 mg).  Spinach,  cup (420 mg).  Spinach, canned,  cup (370 mg).  Sweet potato, baked with skin, 1 medium (450 mg).  Swiss chard,  cup (480 mg).  Tomato or vegetable juice,  cup (275 mg).  Tomato sauce or puree,  cup (400-550 mg).  Tomato, raw, 1 medium (290 mg).  Tomatoes, canned,  cup (200-300 mg).  [place], 3 oz (250 mg).  Wheat germ, 1 oz (250 mg).  Winter squash,  cup (250 mg).  Yogurt, plain or fruited, 6 oz (260-435 mg).  Zucchini,  cup (220 mg). MODERATE IN POTASSIUM The  following foods and beverages have 50-200 mg of potassium per serving:  Apple, 1 each (150 mg).  Apple juice,  cup (150 mg).  Applesauce,  cup (90 mg).  Apricot nectar,  cup (140 mg).  Asparagus, small spears,  cup or 6 spears (155 mg).  Bagel, cinnamon raisin, 1 each (130 mg).  Bagel, egg or plain, 4 in., 1 each (70 mg).  Beans, green,  cup (90 mg).  Beans, yellow,  cup (190 mg).  Beer, regular, 12 oz (100 mg).  Beets, canned,  cup (125 mg).  Blackberries,  cup (115 mg).  Blueberries,  cup (60 mg).  Bread, whole wheat, 1 slice (70 mg).  Broccoli, raw,  cup (145 mg).  Cabbage,  cup (150 mg).  Carrots, cooked or raw,  cup (180 mg).  Cauliflower, raw,  cup (150 mg).  Celery, raw,  cup (155 mg).  Cereal, bran flakes, cup (120-150 mg).  Cheese, cottage,  cup (110 mg).  Cherries, 10 each (150 mg).  Chocolate, 1 oz bar (165 mg).  Coffee, brewed 6 oz (90 mg).  Corn,  cup or 1 ear (195 mg).  Cucumbers,  cup (80 mg).  Egg, large, 1 each (60 mg).  Eggplant,  cup (60 mg).  Endive, raw, cup (80 mg).  English  muffin, 1 each (65 mg).  Fish, orange roughy, 3 oz (150 mg).  Frankfurter, beef or pork, 1 each (75 mg).  Fruit cocktail,  cup (115 mg).  Grape juice,  cup (170 mg).  Grapefruit,  fruit (175 mg).  Grapes,  cup (155 mg).  Greens: kale, turnip, collard,  cup (110-150 mg).  Ice cream or frozen yogurt, chocolate,  cup (175 mg).  Ice cream or frozen yogurt, vanilla,  cup (120-150 mg).  Lemons, limes, 1 each (80 mg).  Lettuce, all types, 1 cup (100 mg).  Mixed vegetables,  cup (150 mg).  Mushrooms, raw,  cup (110 mg).  Nuts: walnuts, pecans, or macadamia, 1 oz (125 mg).  Oatmeal,  cup (80 mg).  Okra,  cup (110 mg).  Onions, raw,  cup (120 mg).  Peach, 1 each (185 mg).  Peaches, canned,  cup (120 mg).  Pears, canned,  cup (120 mg).  Peas, green, frozen,  cup (90 mg).  Peppers, green,  cup (130 mg).  Peppers, red,  cup (160 mg).  Pineapple juice,  cup (165 mg).  Pineapple, fresh or canned,  cup (100 mg).  Plums, 1 each (105 mg).  Pudding, vanilla,  cup (150 mg).  Raspberries,  cup (90 mg).  Rhubarb,  cup (115 mg).  Rice, wild,  cup (80 mg).  Shrimp, 3 oz (155 mg).  Spinach, raw, 1 cup (170 mg).  Strawberries,  cup (125 mg).  Summer squash  cup (175-200 mg).  Swiss chard, raw, 1 cup (135 mg).  Tangerines, 1 each (140 mg).  Tea, brewed, 6 oz (65 mg).  Turnips,  cup (140 mg).  Watermelon,  cup (85 mg).  Wine, red, table, 5 oz (180 mg).  Wine, white, table, 5 oz (100 mg). LOW IN POTASSIUM The following foods and beverages have less than 50 mg of potassium per serving.  Bread, white, 1 slice (30 mg).  Carbonated beverages, 12 oz (less than 5 mg).  Cheese, 1 oz (20-30 mg).  Cranberries,  cup (45 mg).  Cranberry juice cocktail,  cup (20 mg).  Fats and oils, 1 Tbsp (less than 5 mg).  Hummus, 1 Tbsp (32 mg).  Nectar: papaya, mango, or pear,  cup (35 mg).  Rice, white or brown,  cup (50  mg).  Spaghetti or macaroni,  cup cooked (30 mg).  Tortilla, flour or corn, 1 each (50 mg).  Waffle, 4 in., 1 each (50 mg).  Water chestnuts,  cup (40 mg).   This information is not intended to replace advice given to you by your health care provider. Make sure you discuss any questions you have with your health care provider.   Document Released: 01/29/2005 Document Revised: 06/22/2013 Document Reviewed: 05/14/2013 Elsevier Interactive Patient Education [employer].

## 2016-01-04 NOTE — ED Provider Notes (Signed)
CSN: [id number]     Arrival date & time 01/04/16  1658 History   First MD Initiated Contact with Patient 01/04/16 1750     Chief Complaint  Patient presents with  . CA Pt    . Rectal Bleeding  . Back Pain     (Consider location/radiation/quality/duration/timing/severity/associated sxs/prior Treatment) HPI   [name] is a 65 y.o. female presents for evaluation of loose stool, red in color, for 3 days. She recalls eating a moderate amount of watermelon 3 days ago. Also has increased bilateral flank pain radiating to her lower abdomen. She is currently on a drug holiday from oral chemotherapy, but plans on starting it tomorrow. She denies headache, weakness, dizziness, fever, chills, cough or chest pain. There are no other known modifying factors.  Past Medical History  Diagnosis Date  . Hypercholesterolemia   . Compression fracture 04/08/2007    pathologic compression fracture  . Hypothyroidism   . FHx: chemotherapy     s/p 5 cycle revlimid/low dose decadron,s/p velcade,doxil,decadron,  . Hx of radiation therapy 05/05/07-05/18/07,& 03/05/11-03/21/11-    l3&l5 in 2008, t2-t6 03/2011  . GERD (gastroesophageal reflux disease)   . Insomnia     associated with steroids  . Constipation     takes oxycontin,vicodin  . Hx of radiation therapy 05/05/2007 to 05/18/2007    palliative, L3-5  . Hx of radiation therapy 03/05/2011 to 03/21/2011    palliative T2-T6, c-spine  . 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 PONV (postoperative nausea and vomiting)   . Multiple myeloma ([place]) dx'd 2009  . Metastasis to bone ([place])   . Family history of anesthesia complication     "daughter gets PONV too"  . Pneumonia     "several times"  . Stroke [facility]) 2014    denies residual on 01/27/2014   Past Surgical History  Procedure Laterality Date  . Knee arthroscopy Right     "put pin in"  . Knee arthroscopy Right     "took pin out and corrected what was wrong"   . Video bronchoscopy  07/30/2011    Procedure: VIDEO BRONCHOSCOPY WITHOUT FLUORO;  Surgeon: Kathee Delton, MD;  Location: [facility];  Service: Cardiopulmonary;  Laterality: Bilateral;  . Cholecystectomy  1980's  . Portacath placement Right 2009  . Vaginal hysterectomy  1980's   Family History  Problem Relation Age of Onset  . Lung cancer Brother   . Colon cancer Maternal Uncle   . Breast cancer Maternal Grandmother    Social History  Substance Use Topics  . Smoking status: Former Smoker -- 0.25 packs/day for 6 years    Types: Cigarettes    Quit date: 08/27/1978  . Smokeless tobacco: Never Used  . Alcohol Use: No     Comment: "quit drinking in the 1980's", previously drank on the weekend   OB History    No data available     Review of Systems  All other systems reviewed and are negative.     Allergies  Review of patient's allergies indicates no known allergies.  Home Medications   Prior to Admission medications   Medication Sig Start Date End Date Taking? Authorizing Provider  ALPRAZolam Duanne Moron) 0.5 MG tablet TAKE 1/2 TO 1 TABLET 2 TO 3 TIMES DAILY AS NEEDED FOR ANXIETY 11/28/15   Unk Pinto, MD  aspirin 81 MG tablet Take 81 mg by mouth daily.    Historical Provider, MD  benzonatate (TESSALON) 200  MG capsule Take 1 perle 3 x/day to prevent cough 12/08/15   Unk Pinto, MD  citalopram (CELEXA) 40 MG tablet TAKE 1 TABLET (40 MG TOTAL) BY MOUTH DAILY. 12/25/15   Unk Pinto, MD  cyclobenzaprine (FLEXERIL) 10 MG tablet Take 1 tablet (10 mg total) by mouth 3 (three) times daily as needed for muscle spasms. 10/04/15 10/03/16  Courtney Forcucci, [name]  dexamethasone (DECADRON) 4 MG tablet TAKE (10) TABLETS BY MOUTH EVERY FRIDAY Patient not taking: Reported on 12/28/2015 11/20/15   Curt Bears, MD  diphenoxylate-atropine (LOMOTIL) 2.5-0.025 MG tablet TAKE 2 TABLETS BY MOUTH 4 TIMES A DAY AS NEEDED FOR DIARRHEA Patient not taking: Reported on 12/28/2015 09/21/15   Susanne Borders, NP  Eszopiclone 3 MG TABS TAKE 1 TABLET BY MOUTH AT BEDTIME AS NEEDED FOR SLEEP 12/26/15   Vicie Mutters, [name]  furosemide (LASIX) 40 MG tablet TAKE 1 TABLET BY MOUTH TWICE A DAY FOR FLUID AND SWELLING 01/03/16   Vicie Mutters, [name]  gabapentin (NEURONTIN) 600 MG tablet TAKE 1 TABLET 4 X DAILY AS NEEDED FOR PAIN OR CRAMPS 01/03/16   Loma Sousa Forcucci, [name]  HYDROcodone-acetaminophen (NORCO) 7.5-325 MG tablet Take 1 tablet by mouth every 6 (six) hours as needed for moderate pain. 11/28/15   Curt Bears, MD  KLOR-CON 10 10 MEQ tablet TAKE 1 TABLET BY MOUTH TWICE A DAY 10/30/15   Unk Pinto, MD  levothyroxine (SYNTHROID, LEVOTHROID) 100 MCG tablet Take 100 mcg by mouth daily before breakfast.     Historical Provider, MD  loperamide (IMODIUM) 2 MG capsule Take 2 mg by mouth as needed for diarrhea or loose stools. Reported on 10/02/2015    Historical Provider, MD  Magnesium 500 MG TABS Take 1 tablet by mouth daily.    Historical Provider, MD  mometasone (NASONEX) 50 MCG/ACT nasal spray Place 2 sprays into the nose daily. 10/03/15 10/02/16  Courtney Forcucci, [name]  montelukast (SINGULAIR) 10 MG tablet TAKE 1 TABLET BY MOUTH EVERY DAY 12/11/15   Unk Pinto, MD  morphine (MS CONTIN) 15 MG 12 hr tablet Take 1 tablet (15 mg total) by mouth every 12 (twelve) hours. 11/30/15   Curt Bears, MD  Multiple Vitamin (MULITIVITAMIN WITH MINERALS) TABS Take 1 tablet by mouth every morning.     Historical Provider, MD  nystatin (MYCOSTATIN) 100000 UNIT/ML suspension Gargle 5 mL PO q6hours and spit out after 20-30 seconds.  Use for 1 week 10/18/15   Starlyn Skeans, [name]  ondansetron (ZOFRAN-ODT) 8 MG disintegrating tablet TAKE 1 TABLET BY MOUTH EVERY 8 HOURS AS NEEDED FOR NAUSEA AND VOMITING 12/26/15   Unk Pinto, MD  pantoprazole (PROTONIX) 40 MG tablet TAKE 1 TABLET (40 MG TOTAL) BY MOUTH 2 (TWO) TIMES DAILY. 01/04/16   Vicie Mutters, [name]  pomalidomide (POMALYST) 2 MG capsule Take 1 capsule (2 mg  total) by mouth daily. Take with water on days 1-21. Repeat every 28 days. 01/01/16   Curt Bears, MD  predniSONE (DELTASONE) 20 MG tablet 1 tab 3 x day for 3 days, then 1 tab 2 x day for 3 days, then 1 tab 1 x day for 5 days 12/08/15   Unk Pinto, MD  SYNTHROID 100 MCG tablet Take  Tablet daily on an empty stomach for 30 minutes 12/17/15   Unk Pinto, MD   BP 107/71 mmHg  Pulse 90  Temp(Src) 98.1 F (36.7 C) (Oral)  Resp 16  SpO2 99% Physical Exam  Constitutional: She is oriented to person, place, and time. She appears well-developed  and well-nourished.  HENT:  Head: Normocephalic and atraumatic.  Right Ear: External ear normal.  Left Ear: External ear normal.  Eyes: Conjunctivae and EOM are normal. Pupils are equal, round, and reactive to light.  Neck: Normal range of motion and phonation normal. Neck supple.  Cardiovascular: Normal rate, regular rhythm and normal heart sounds.   Pulmonary/Chest: Effort normal and breath sounds normal. She exhibits no bony tenderness.  Abdominal: Soft. There is no tenderness.  Genitourinary:  Anus with redundant tissue consistent with prior hemorrhoids. No evident interval fissure. Small amount of brown stool in the rectal vault with some tinges of red color, associated. No rectal mass. No fecal impaction.  Musculoskeletal: Normal range of motion.  Neurological: She is alert and oriented to person, place, and time. No cranial nerve deficit or sensory deficit. She exhibits normal muscle tone. Coordination normal.  Skin: Skin is warm, dry and intact.  Psychiatric: She has a normal mood and affect. Her behavior is normal. Judgment and thought content normal.  Nursing note and vitals reviewed.   ED Course  Procedures (including critical care time)  Medications  ondansetron (ZOFRAN-ODT) disintegrating tablet 8 mg (8 mg Oral Given 01/04/16 1924)  oxyCODONE-acetaminophen (PERCOCET/ROXICET) 5-325 MG per tablet 1 tablet (1 tablet Oral Given 01/04/16  1924)  potassium chloride SA (K-DUR,KLOR-CON) CR tablet 40 mEq (40 mEq Oral Given 01/04/16 1952)    Patient Vitals for the past 24 hrs:  BP Temp Temp src Pulse Resp SpO2  01/04/16 2028 127/61 mmHg - - 70 18 99 %  01/04/16 1731 107/71 mmHg 98.1 F (36.7 C) Oral 90 16 99 %    8:30 PM Reevaluation with update and discussion. After initial assessment and treatment, an updated evaluation reveals She is tolerating oral liquids and states that she is feeling better. She denies dizziness or pain at this time. She has potassium at home, with which to take an increased dose of for a period of time. She has access to follow-up care with her oncologist, by phone tomorrow. Findings discussed with patient and family members, all questions were answered. Hamp Moreland L     Labs Review Labs Reviewed  CBC - Abnormal; Notable for the following:    WBC 3.1 (*)    RBC 2.86 (*)    Hemoglobin 9.8 (*)    HCT 29.4 (*)    MCV 102.8 (*)    MCH 34.3 (*)    RDW 15.6 (*)    All other components within normal limits  COMPREHENSIVE METABOLIC PANEL  POC OCCULT BLOOD, ED  TYPE AND SCREEN   Component     Latest Ref Rng 12/28/2015 01/04/2016           WBC     4.0 - 10.5 K/uL 2.2 (L) 3.1 (L)  RBC     3.87 - 5.11 MIL/uL 3.11 (L) 2.86 (L)  Hemoglobin     12.0 - 15.0 g/dL 10.6 (L) 9.8 (L)  HCT     36.0 - 46.0 % 32.3 (L) 29.4 (L)  MCV     78.0 - 100.0 fL 103.7 (H) 102.8 (H)  MCH     26.0 - 34.0 pg 34.0 34.3 (H)  MCHC     30.0 - 36.0 g/dL 32.8 33.3  RDW     11.5 - 15.5 % 17.4 (H) 15.6 (H)  Platelets     150 - 400 K/uL 166 228  MPV     7.5 - 12.5 fL    Neutrophils  NEUT#     1.5 - 6.5 10e3/uL 0.8 (L)   Lymphocytes           Imaging Review Mr Kizzie Fantasia Contrast  01/04/2016  CLINICAL DATA:  Multiple myeloma.  Skull lesion.  Confusion. EXAM: MRI HEAD WITHOUT AND WITH CONTRAST TECHNIQUE: Multiplanar, multiecho pulse sequences of the brain and surrounding structures were obtained without and with  intravenous contrast. CONTRAST:  60m MULTIHANCE GADOBENATE DIMEGLUMINE 529 MG/ML IV SOLN COMPARISON:  08/24/2015. FINDINGS: No evidence for acute infarction, hemorrhage, mass lesion, hydrocephalus, or extra-axial fluid. Normal cerebral volume. Mild subcortical and periventricular T2 and FLAIR hyperintensities, likely chronic microvascular ischemic change. Pituitary, pineal, and cerebellar tonsils unremarkable. No upper cervical lesions. Post infusion, abnormal pachymeningeal enhancement is redemonstrated, greatest associated with the RIGHT parietal lesion representing metastatic melanoma. The lesion is essentially unchanged in size, measuring 10 x 34 x 35 mm (R-L x A-P x C-C). Smaller focal calvarial lesions are seen in the LEFT parietal bone, and in the midline posteriorly. Overall, no significant change from priors. No midline shift or significant adjacent vasogenic edema. No sinus or mastoid disease. Negative orbits. Clival heterogeneity and upper cervical heterogeneity along with a permeative appearance to the diploic space, representing diffuse osseous disease. IMPRESSION: Stable findings of multiple myeloma. Mild RIGHT parietal pachymeningeal enhancement adjacent to the largest deposit, but no intra-axial edema or parenchymal lesions. No acute intracranial abnormality. Electronically Signed   By: JStaci RighterM.D.   On: 01/04/2016 08:35   I have personally reviewed and evaluated these images and lab results as part of my medical decision-making.   EKG Interpretation None      MDM   Final diagnoses:  Rectal bleeding  Anemia, unspecified anemia type  Hypokalemia    Reported rectal bleeding, evidenced by red color, but negative occult blood testing today. Hemoglobin is Near baseline. Record review indicates last magnesium level normal, and she is taking magnesium currently twice a day. Similar episode of hyperkalemia, May 2017 improved with increased dose of potassium. She is hemodynamically  stable. Incidental note made of a normal MRI imaging brain, done earlier today.  Nursing Notes Reviewed/ Care Coordinated Applicable Imaging Reviewed Interpretation of Laboratory Data incorporated into ED treatment  The patient appears reasonably screened and/or stabilized for discharge and I doubt any other medical condition or other [facility] further screening, evaluation, or treatment in the ED at this time prior to discharge.  Plan: Home Medications- potassium 20 mEq 3 times a day, 1 week; Home Treatments- rest, fluids; return here if the recommended treatment, does not improve the symptoms; Recommended follow up- follow up with oncology tomorrow by phone for further instructions, and arrangement of follow-up laboratory testing in one or 2 days.    EDaleen Bo MD 01/04/16 2031

## 2016-01-04 NOTE — ED Notes (Signed)
Pt c/o lower back pain x 10 years and increasing rectal bleeding and nausea starting yesterday.  Pain score 5/10.  Pt reports dark blood mixed w/ diarrhea.  Last chemo x 8 days ago.  Pt reports multiple diagnostic tests on back recently.  Hx of multiple myeloma.

## 2016-01-04 NOTE — ED Notes (Signed)
MD/RN notified of abnormal potassium

## 2016-01-08 ENCOUNTER — Other Ambulatory Visit: Payer: Self-pay | Admitting: Internal Medicine

## 2016-01-09 ENCOUNTER — Encounter (HOSPITAL_COMMUNITY): Payer: Self-pay | Admitting: *Deleted

## 2016-01-09 ENCOUNTER — Emergency Department (HOSPITAL_COMMUNITY): Payer: Medicare Other

## 2016-01-09 ENCOUNTER — Telehealth: Payer: Self-pay | Admitting: Medical Oncology

## 2016-01-09 ENCOUNTER — Inpatient Hospital Stay (HOSPITAL_COMMUNITY)
Admission: EM | Admit: 2016-01-09 | Discharge: 2016-01-12 | DRG: 193 | Disposition: A | Discharge status: Home / Self Care | Payer: Medicare Other | Attending: Internal Medicine | Admitting: Internal Medicine

## 2016-01-09 DIAGNOSIS — E785 Hyperlipidemia, unspecified: Secondary | ICD-10-CM | POA: Diagnosis present

## 2016-01-09 DIAGNOSIS — J9601 Acute respiratory failure with hypoxia: Secondary | ICD-10-CM | POA: Diagnosis present

## 2016-01-09 DIAGNOSIS — C9002 Multiple myeloma in relapse: Secondary | ICD-10-CM

## 2016-01-09 DIAGNOSIS — E039 Hypothyroidism, unspecified: Secondary | ICD-10-CM | POA: Diagnosis present

## 2016-01-09 DIAGNOSIS — Z9484 Stem cells transplant status: Secondary | ICD-10-CM | POA: Diagnosis not present

## 2016-01-09 DIAGNOSIS — E876 Hypokalemia: Secondary | ICD-10-CM | POA: Diagnosis not present

## 2016-01-09 DIAGNOSIS — R42 Dizziness and giddiness: Secondary | ICD-10-CM

## 2016-01-09 DIAGNOSIS — Z87891 Personal history of nicotine dependence: Secondary | ICD-10-CM | POA: Diagnosis not present

## 2016-01-09 DIAGNOSIS — Z923 Personal history of irradiation: Secondary | ICD-10-CM | POA: Diagnosis not present

## 2016-01-09 DIAGNOSIS — Z8673 Personal history of transient ischemic attack (TIA), and cerebral infarction without residual deficits: Secondary | ICD-10-CM | POA: Diagnosis not present

## 2016-01-09 DIAGNOSIS — J189 Pneumonia, unspecified organism: Principal | ICD-10-CM | POA: Diagnosis present

## 2016-01-09 DIAGNOSIS — R0602 Shortness of breath: Secondary | ICD-10-CM

## 2016-01-09 DIAGNOSIS — R5383 Other fatigue: Secondary | ICD-10-CM

## 2016-01-09 DIAGNOSIS — K219 Gastro-esophageal reflux disease without esophagitis: Secondary | ICD-10-CM | POA: Diagnosis not present

## 2016-01-09 DIAGNOSIS — I10 Essential (primary) hypertension: Secondary | ICD-10-CM | POA: Diagnosis not present

## 2016-01-09 DIAGNOSIS — Y95 Nosocomial condition: Secondary | ICD-10-CM | POA: Diagnosis present

## 2016-01-09 DIAGNOSIS — E782 Mixed hyperlipidemia: Secondary | ICD-10-CM | POA: Diagnosis present

## 2016-01-09 DIAGNOSIS — R531 Weakness: Secondary | ICD-10-CM | POA: Diagnosis not present

## 2016-01-09 LAB — CBC WITH DIFFERENTIAL/PLATELET
BASOS ABS: 0 10*3/uL (ref 0.0–0.1)
Basophils Relative: 0 %
Eosinophils Absolute: 0.1 10*3/uL (ref 0.0–0.7)
Eosinophils Relative: 3 %
HEMATOCRIT: 32.9 % — AB (ref 36.0–46.0)
HEMOGLOBIN: 10.3 g/dL — AB (ref 12.0–15.0)
LYMPHS PCT: 37 %
Lymphs Abs: 1 10*3/uL (ref 0.7–4.0)
MCH: 33.4 pg (ref 26.0–34.0)
MCHC: 31.3 g/dL (ref 30.0–36.0)
MCV: 106.8 fL — AB (ref 78.0–100.0)
Monocytes Absolute: 0.2 10*3/uL (ref 0.1–1.0)
Monocytes Relative: 8 %
NEUTROS ABS: 1.4 10*3/uL — AB (ref 1.7–7.7)
NEUTROS PCT: 52 %
PLATELETS: 250 10*3/uL (ref 150–400)
RBC: 3.08 MIL/uL — AB (ref 3.87–5.11)
RDW: 16.5 % — ABNORMAL HIGH (ref 11.5–15.5)
WBC: 2.7 10*3/uL — AB (ref 4.0–10.5)

## 2016-01-09 LAB — MAGNESIUM: Magnesium: 1.6 mg/dL — ABNORMAL LOW (ref 1.7–2.4)

## 2016-01-09 LAB — I-STAT CG4 LACTIC ACID, ED: Lactic Acid, Venous: 1.86 mmol/L (ref 0.5–1.9)

## 2016-01-09 LAB — COMPREHENSIVE METABOLIC PANEL
ALT: 19 U/L (ref 14–54)
ANION GAP: 7 (ref 5–15)
AST: 22 U/L (ref 15–41)
Albumin: 3.5 g/dL (ref 3.5–5.0)
Alkaline Phosphatase: 78 U/L (ref 38–126)
BILIRUBIN TOTAL: 0.6 mg/dL (ref 0.3–1.2)
BUN: 10 mg/dL (ref 6–20)
CHLORIDE: 102 mmol/L (ref 101–111)
CO2: 29 mmol/L (ref 22–32)
Calcium: 8.8 mg/dL — ABNORMAL LOW (ref 8.9–10.3)
Creatinine, Ser: 0.77 mg/dL (ref 0.44–1.00)
GFR calc Af Amer: >60 mL/min (ref >60)
Glucose, Bld: 107 mg/dL — ABNORMAL HIGH (ref 65–99)
POTASSIUM: 3.6 mmol/L (ref 3.5–5.1)
Sodium: 138 mmol/L (ref 135–145)
TOTAL PROTEIN: 6.9 g/dL (ref 6.5–8.1)

## 2016-01-09 LAB — BRAIN NATRIURETIC PEPTIDE: B NATRIURETIC PEPTIDE 5: 45.8 pg/mL (ref 0.0–100.0)

## 2016-01-09 LAB — I-STAT TROPONIN, ED: Troponin i, poc: 0.01 ng/mL (ref 0.00–0.08)

## 2016-01-09 LAB — D-DIMER, QUANTITATIVE (NOT AT ARMC): D DIMER QUANT: 1.45 ug{FEU}/mL — AB (ref 0.00–0.50)

## 2016-01-09 MED ORDER — MAGNESIUM SULFATE 2 GM/50ML IV SOLN
2.0000 g | Freq: Once | INTRAVENOUS | Status: AC
  Administered 2016-01-10: 2 g via INTRAVENOUS
  Filled 2016-01-09: qty 50

## 2016-01-09 MED ORDER — SODIUM CHLORIDE 0.9 % IV BOLUS (SEPSIS)
1000.0000 mL | Freq: Once | INTRAVENOUS | Status: AC
  Administered 2016-01-09: 1000 mL via INTRAVENOUS

## 2016-01-09 MED ORDER — ACETAMINOPHEN 325 MG PO TABS
650.0000 mg | ORAL_TABLET | Freq: Once | ORAL | Status: AC
  Administered 2016-01-09: 650 mg via ORAL
  Filled 2016-01-09: qty 2

## 2016-01-09 MED ORDER — VANCOMYCIN HCL 10 G IV SOLR
1250.0000 mg | Freq: Two times a day (BID) | INTRAVENOUS | Status: DC
  Administered 2016-01-09 – 2016-01-12 (×6): 1250 mg via INTRAVENOUS
  Filled 2016-01-09 (×6): qty 1250

## 2016-01-09 MED ORDER — DEXTROSE 5 % IV SOLN
1.0000 g | Freq: Once | INTRAVENOUS | Status: AC
  Administered 2016-01-09: 1 g via INTRAVENOUS
  Filled 2016-01-09: qty 1

## 2016-01-09 MED ORDER — IOPAMIDOL (ISOVUE-370) INJECTION 76%
100.0000 mL | Freq: Once | INTRAVENOUS | Status: AC | PRN
  Administered 2016-01-09: 100 mL via INTRAVENOUS

## 2016-01-09 MED ORDER — MECLIZINE HCL 25 MG PO TABS
25.0000 mg | ORAL_TABLET | Freq: Once | ORAL | Status: AC
  Administered 2016-01-09: 25 mg via ORAL
  Filled 2016-01-09: qty 1

## 2016-01-09 MED ORDER — VANCOMYCIN HCL IN DEXTROSE 1-5 GM/200ML-% IV SOLN: 1000.0000 mg | INTRAVENOUS | Status: AC

## 2016-01-09 NOTE — Progress Notes (Signed)
Pharmacy Antibiotic Note  [name] is a 65 y.o. female admitted on 01/09/2016 with pneumonia.  Pharmacy has been consulted for vancomycin dosing. Cefepime ordered by EDP x 1 dose.  She has MM and receiving pomalidomide. CTA negative for PE but likely multilobar pneumonia.    Plan:  Vancomcyin 1gm x 1 then '1250mg'$  IV q12h  Monitor renal function  Check trough if remains on vancomycin > 3-4 days  If cefepime to continue, cefepime 1gm IV q8h would be recommended dose  ?? Plan for cultures - d/w ED [name]  Height: 5' 4.75" (164.5 cm) Weight: 210 lb 11.2 oz (95.573 kg) IBW/kg (Calculated) : 56.43  Temp (24hrs), Avg:98.9 F (37.2 C), Min:98.9 F (37.2 C), Max:98.9 F (37.2 C)   Recent Labs Lab 01/04/16 1830 01/09/16 1630  WBC 3.1* 2.7*  CREATININE 0.76 0.77    Estimated Creatinine Clearance: 80.9 mL/min (by C-G formula based on Cr of 0.77).    No Known Allergies  Antimicrobials this admission: 7/11 >> vancomycin 7/11 >> cefepime  Dose adjustments this admission:  Microbiology results:  Thank you for allowing pharmacy to be a part of this patient's care.  Doreene Eland, PharmD, BCPS.   Pager: [phone] 01/09/2016 9:37 PM

## 2016-01-09 NOTE — Telephone Encounter (Signed)
Dizzy, lightheaded, headache- in her car at her daughters house in [place]. I told her to stay in car and call 911 and  call her daughter to come outside to her. Pt stated she is going to call her daughter to come outside and take her to [facility] ED. I reiterated that she call 911 and she said she will call daughter.I called Talana Slatten is in her house and she and  and her brother are going to drive her to [facility] ED.

## 2016-01-09 NOTE — ED Provider Notes (Signed)
CSN: [id number]     Arrival date & time 01/09/16  1533 History   First MD Initiated Contact with Patient 01/09/16 1745     Chief Complaint  Patient presents with  . Dizziness    [name] is a 65 y.o. female with a history of multiple myeloma who presents to the emergency department complaining of dizziness, lightheadedness, and general weakness starting this morning. Patient also complains of worsening dyspnea on exertion that has been ongoing for several years. Patient reports this morning she required to have her hand on her couch when walking. She reports feeling off balance and having room spinning dizziness. She also complains of feeling lightheaded with position change. She reports she feels weak all over. She is followed by oncologist Dr. Julien Nordmann. She is on oral chemotherapy. Patient reports dyspnea on exertion that is worsened today. She denies any chest pain. Patient also reports chronic left ankle edema that has been present for many years. She reports previous Doppler ultrasound revealed no DVT. Patient denies fevers, focal weakness, abdominal pain, hematochezia, diarrhea, vomiting, cough, chest pain, double vision, neck pain, urinary symptoms or rashes.  Patient is a 65 y.o. female presenting with dizziness. The history is provided by the patient. No language interpreter was used.  Dizziness Associated symptoms: nausea, shortness of breath and weakness (Generalized weakness.)   Associated symptoms: no blood in stool, no chest pain, no diarrhea, no headaches, no palpitations and no vomiting     Past Medical History  Diagnosis Date  . Hypercholesterolemia   . Compression fracture 04/08/2007    pathologic compression fracture  . Hypothyroidism   . FHx: chemotherapy     s/p 5 cycle revlimid/low dose decadron,s/p velcade,doxil,decadron,  . Hx of radiation therapy 05/05/07-05/18/07,& 03/05/11-03/21/11-    l3&l5 in 2008, t2-t6 03/2011  . GERD (gastroesophageal reflux disease)   .  Insomnia     associated with steroids  . Constipation     takes oxycontin,vicodin  . Hx of radiation therapy 05/05/2007 to 05/18/2007    palliative, L3-5  . Hx of radiation therapy 03/05/2011 to 03/21/2011    palliative T2-T6, c-spine  . 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 PONV (postoperative nausea and vomiting)   . Multiple myeloma ([place]) dx'd 2009  . Metastasis to bone ([place])   . Family history of anesthesia complication     "daughter gets PONV too"  . Pneumonia     "several times"  . Stroke [facility]) 2014    denies residual on 01/27/2014   Past Surgical History  Procedure Laterality Date  . Knee arthroscopy Right     "put pin in"  . Knee arthroscopy Right     "took pin out and corrected what was wrong"  . Video bronchoscopy  07/30/2011    Procedure: VIDEO BRONCHOSCOPY WITHOUT FLUORO;  Surgeon: Kathee Delton, MD;  Location: [facility];  Service: Cardiopulmonary;  Laterality: Bilateral;  . Cholecystectomy  1980's  . Portacath placement Right 2009  . Vaginal hysterectomy  1980's   Family History  Problem Relation Age of Onset  . Lung cancer Brother   . Colon cancer Maternal Uncle   . Breast cancer Maternal Grandmother    Social History  Substance Use Topics  . Smoking status: Former Smoker -- 0.25 packs/day for 6 years    Types: Cigarettes    Quit date: 08/27/1978  . Smokeless tobacco: Never Used  . Alcohol Use: No  Comment: "quit drinking in the 1980's", previously drank on the weekend   OB History    No data available     Review of Systems  Constitutional: Negative for fever and chills.  HENT: Negative for congestion, sore throat and trouble swallowing.   Eyes: Negative for visual disturbance.  Respiratory: Positive for shortness of breath. Negative for cough and wheezing.   Cardiovascular: Negative for chest pain and palpitations.  Gastrointestinal: Positive for nausea. Negative for vomiting, abdominal pain, diarrhea  and blood in stool.  Genitourinary: Negative for dysuria, urgency and difficulty urinating.  Musculoskeletal: Negative for back pain and neck pain.  Skin: Negative for rash.  Neurological: Positive for dizziness, weakness (Generalized weakness.) and light-headedness. Negative for syncope, facial asymmetry, speech difficulty, numbness and headaches.      Allergies  Review of patient's allergies indicates no known allergies.  Home Medications   Prior to Admission medications   Medication Sig Start Date End Date Taking? Authorizing Provider  ALPRAZolam Duanne Moron) 0.5 MG tablet TAKE 1/2 TO 1 TABLET 2 TO 3 TIMES DAILY AS NEEDED FOR ANXIETY 11/28/15  Yes Unk Pinto, MD  aspirin 81 MG tablet Take 81 mg by mouth as directed. Mondays, Wednesdays, and Fridays   Yes Historical Provider, MD  citalopram (CELEXA) 40 MG tablet TAKE 1 TABLET (40 MG TOTAL) BY MOUTH DAILY. 12/25/15  Yes Unk Pinto, MD  cyclobenzaprine (FLEXERIL) 10 MG tablet Take 1 tablet (10 mg total) by mouth 3 (three) times daily as needed for muscle spasms. 10/04/15 10/03/16 Yes Courtney Forcucci, [name]  dexamethasone (DECADRON) 4 MG tablet TAKE (10) TABLETS BY MOUTH EVERY FRIDAY Patient taking differently: TAKE (5) TABLETS BY MOUTH EVERY FRIDAY 11/20/15  Yes Curt Bears, MD  diphenoxylate-atropine (LOMOTIL) 2.5-0.025 MG tablet TAKE 2 TABLETS BY MOUTH 4 TIMES A DAY AS NEEDED FOR DIARRHEA 09/21/15  Yes Susanne Borders, NP  Eszopiclone 3 MG TABS TAKE 1 TABLET BY MOUTH AT BEDTIME AS NEEDED FOR SLEEP 12/26/15  Yes Vicie Mutters, [name]  furosemide (LASIX) 40 MG tablet TAKE 1 TABLET BY MOUTH TWICE A DAY FOR FLUID AND SWELLING 01/03/16  Yes Vicie Mutters, [name]  gabapentin (NEURONTIN) 600 MG tablet TAKE 1 TABLET 4 X DAILY AS NEEDED FOR PAIN OR CRAMPS 01/03/16  Yes Courtney Forcucci, [name]  HYDROcodone-acetaminophen (NORCO) 7.5-325 MG tablet Take 1 tablet by mouth every 6 (six) hours as needed for moderate pain. 11/28/15  Yes Curt Bears, MD   KLOR-CON 10 10 MEQ tablet TAKE 1 TABLET BY MOUTH TWICE A DAY Patient taking differently: TAKE 1 TABLET BY MOUTH THREE TIMES DAILY X1 WEEK. --USUALLY ONLY TAKES TWO DAILY. 10/30/15  Yes Unk Pinto, MD  loperamide (IMODIUM) 2 MG capsule Take 2 mg by mouth as needed for diarrhea or loose stools. Reported on 10/02/2015   Yes Historical Provider, MD  Magnesium 500 MG TABS Take 500 mg by mouth 2 (two) times daily.    Yes Historical Provider, MD  mometasone (NASONEX) 50 MCG/ACT nasal spray PLACE 2 SPRAYS INTO THE NOSE DAILY. Patient taking differently: PLACE 2 SPRAYS INTO THE NOSE DAILY AS NEEDED FOR ALLERGIES. 01/08/16  Yes Unk Pinto, MD  montelukast (SINGULAIR) 10 MG tablet TAKE 1 TABLET BY MOUTH EVERY DAY 12/11/15  Yes Unk Pinto, MD  morphine (MS CONTIN) 15 MG 12 hr tablet Take 1 tablet (15 mg total) by mouth every 12 (twelve) hours. 11/30/15  Yes Curt Bears, MD  Multiple Vitamin (MULITIVITAMIN WITH MINERALS) TABS Take 1 tablet by mouth every morning.  Yes Historical Provider, MD  nystatin (MYCOSTATIN) 100000 UNIT/ML suspension Gargle 5 mL PO q6hours and spit out after 20-30 seconds.  Use for 1 week Patient taking differently: Use as directed 5 mLs in the mouth or throat every 6 (six) hours as needed (sore throat/thrush.).  10/18/15  Yes Courtney Forcucci, [name]  ondansetron (ZOFRAN-ODT) 8 MG disintegrating tablet TAKE 1 TABLET BY MOUTH EVERY 8 HOURS AS NEEDED FOR NAUSEA AND VOMITING 12/26/15  Yes Unk Pinto, MD  pantoprazole (PROTONIX) 40 MG tablet TAKE 1 TABLET (40 MG TOTAL) BY MOUTH 2 (TWO) TIMES DAILY. Patient taking differently: Take 40 mg by mouth daily.  01/04/16  Yes Vicie Mutters, [name]  pomalidomide (POMALYST) 2 MG capsule Take 1 capsule (2 mg total) by mouth daily. Take with water on days 1-21. Repeat every 28 days. Patient taking differently: Take 2 mg by mouth daily. Take with water on days 1-21. Repeat every 21 days. 01/01/16  Yes Curt Bears, MD  SYNTHROID 100 MCG  tablet Take  Tablet daily on an empty stomach for 30 minutes Patient taking differently: Take 100 mcg by mouth. Take 112mg on an empty stomach once daily on the weekdays and take 517m daily on the weekends 12/17/15  Yes WiUnk PintoMD  benzonatate (TESSALON) 200 MG capsule Take 1 perle 3 x/day to prevent cough Patient not taking: Reported on 01/04/2016 12/08/15   WiUnk PintoMD  predniSONE (DELTASONE) 20 MG tablet 1 tab 3 x day for 3 days, then 1 tab 2 x day for 3 days, then 1 tab 1 x day for 5 days Patient not taking: Reported on 01/04/2016 12/08/15   WiUnk PintoMD   BP 111/58 mmHg  Pulse 79  Temp(Src) 98.4 F (36.9 C) (Oral)  Resp 18  Ht 5' 6"  (1.676 m)  Wt 98.431 kg  BMI 35.04 kg/m2  SpO2 100% Physical Exam  Constitutional: She is oriented to person, place, and time. She appears well-developed and well-nourished. No distress.  Nontoxic appearing.  HENT:  Head: Normocephalic and atraumatic.  Right Ear: External ear normal.  Left Ear: External ear normal.  Oropharynx is clear. Mucous membranes appear slightly dry.  Eyes: Conjunctivae and EOM are normal. Pupils are equal, round, and reactive to light. Right eye exhibits no discharge. Left eye exhibits no discharge.  Neck: Normal range of motion. Neck supple. No JVD present. No tracheal deviation present.  Patient has good range of motion of her neck without difficulty or discomfort.  No meningeal signs.  Cardiovascular: Normal rate, regular rhythm, normal heart sounds and intact distal pulses.  Exam reveals no gallop and no friction rub.   No murmur heard. Pulmonary/Chest: Effort normal and breath sounds normal. No stridor. No respiratory distress. She has no wheezes. She has no rales.  Lungs clear to auscultation bilaterally.  Abdominal: Soft. There is no tenderness. There is no guarding.  Musculoskeletal: Normal range of motion. She exhibits no tenderness.  Patient has good strength to bilateral upper and lower  extremities. She has bilateral ankle edema with her left greater than her right. No calf edema or tenderness.  Lymphadenopathy:    She has no cervical adenopathy.  Neurological: She is alert and oriented to person, place, and time. No cranial nerve deficit. Coordination normal.  Patient is alert and oriented 3. Cranial nerves are intact. Speech is clear and coherent. EOMs are intact. Finger to nose intact bilaterally. No pronator drift.  Skin: Skin is warm and dry. No rash noted. She is not diaphoretic. No  erythema. No pallor.  Psychiatric: She has a normal mood and affect. Her behavior is normal.  Nursing note and vitals reviewed.   ED Course  Procedures (including critical care time) Labs Review Labs Reviewed  COMPREHENSIVE METABOLIC PANEL - Abnormal; Notable for the following:    Glucose, Bld 107 (*)    Calcium 8.8 (*)    All other components within normal limits  CBC WITH DIFFERENTIAL/PLATELET - Abnormal; Notable for the following:    WBC 2.7 (*)    RBC 3.08 (*)    Hemoglobin 10.3 (*)    HCT 32.9 (*)    MCV 106.8 (*)    RDW 16.5 (*)    Neutro Abs 1.4 (*)    All other components within normal limits  MAGNESIUM - Abnormal; Notable for the following:    Magnesium 1.6 (*)    All other components within normal limits  D-DIMER, QUANTITATIVE (NOT AT [facility]) - Abnormal; Notable for the following:    D-Dimer, Quant 1.45 (*)    All other components within normal limits  CULTURE, BLOOD (ROUTINE X 2)  CULTURE, BLOOD (ROUTINE X 2)  GRAM STAIN  CULTURE, EXPECTORATED SPUTUM-ASSESSMENT  BRAIN NATRIURETIC PEPTIDE  STREP PNEUMONIAE URINARY ANTIGEN  CBC WITH DIFFERENTIAL/PLATELET  COMPREHENSIVE METABOLIC PANEL  LEGIONELLA PNEUMOPHILA SEROGP 1 UR AG  I-STAT TROPOININ, ED  I-STAT CG4 LACTIC ACID, ED    Imaging Review Dg Chest 2 View  01/09/2016  CLINICAL DATA:  Dizziness EXAM: CHEST  2 VIEW COMPARISON:  10/31/2015 FINDINGS: Cardiac shadow is within normal limits. A right chest wall port  is again seen. The lungs are well aerated bilaterally. Mild increased density is noted in the lingula consistent with early infiltrate. No other focal abnormality is noted. IMPRESSION: Early lingular infiltrate. Electronically Signed   By: Inez Catalina M.D.   On: 01/09/2016 19:42   Ct Head Wo Contrast  01/09/2016  CLINICAL DATA:  65 year old female with history of multiple myeloma presenting with weakness and dizziness. History of brain tumor with radiation treatment. EXAM: CT HEAD WITHOUT CONTRAST TECHNIQUE: Contiguous axial images were obtained from the base of the skull through the vertex without intravenous contrast. COMPARISON:  Head CT dated 08/09/2013 and MRI dated 01/04/2016 FINDINGS: The ventricles and the sulci are appropriate in size for the patient's age. There is no intracranial hemorrhage. No midline shift or mass effect identified. The gray-white matter differentiation is preserved. Multiple bilateral calvarial lytic lesion with the largest in the right parietal calvarium similar to prior study and compatible with known multiple myeloma. No acute calvarial fracture. IMPRESSION: No acute intracranial pathology. Calvarial lytic lesions compatible with known multiple myeloma. These lesions are stable since 08/09/2013. Electronically Signed   By: Anner Crete M.D.   On: 01/09/2016 20:03   Ct Angio Chest Pe W/cm &/or Wo Cm  01/09/2016  CLINICAL DATA:  Shortness of breath, history of multiple myeloma EXAM: CT ANGIOGRAPHY CHEST WITH CONTRAST TECHNIQUE: Multidetector CT imaging of the chest was performed using the standard protocol during bolus administration of intravenous contrast. Multiplanar CT image reconstructions and MIPs were obtained to evaluate the vascular anatomy. CONTRAST:  100 mL Isovue 370 COMPARISON:  None. FINDINGS: Mediastinum/Lymph Nodes: No pulmonary emboli or thoracic aortic dissection identified. No masses or pathologically enlarged lymph nodes identified. Lungs/Pleura: There  is lingular airspace disease. There is mild patchy right middle lobe airspace disease. There is a small area of consolidation in the posterior left lower lobe. Upper abdomen: No acute findings. Musculoskeletal: No chest wall mass  or suspicious bone lesions identified. Review of the MIP images confirms the above findings. IMPRESSION: 1. No evidence of pulmonary embolus. 2. Lingular airspace disease. Mild patchy right middle lobe airspace disease. Small area of consolidation in the posterior left lower lobe. Overall findings are concerning for multilobar numb Electronically Signed   By: Kathreen Devoid   On: 01/09/2016 21:13   I have personally reviewed and evaluated these images and lab results as part of my medical decision-making.   EKG Interpretation   Date/Time:  Tuesday January 09 2016 16:21:16 EDT Ventricular Rate:  93 PR Interval:    QRS Duration: 75 QT Interval:  473 QTC Calculation: 589 R Axis:   -2 Text Interpretation:  Sinus rhythm Borderline T abnormalities, anterior  leads Prolonged QT interval Confirmed by Alvino Chapel  MD, NATHAN [phone]) on  01/09/2016 5:46:06 PM      Filed Vitals:   01/09/16 2014 01/09/16 2130 01/09/16 2130 01/09/16 2300  BP: 108/62 98/62  111/58  Pulse: 79 80  79  Temp:    98.4 F (36.9 C)  TempSrc:    Oral  Resp:  20  18  Height:   5' 4.75" (1.645 m) 5' 6"  (1.676 m)  Weight:   95.573 kg 98.431 kg  SpO2: 99% 97%  100%     MDM   Meds given in ED:  Medications  vancomycin (VANCOCIN) 1,250 mg in sodium chloride 0.9 % 250 mL IVPB (1,250 mg Intravenous New Bag/Given 01/09/16 2246)  pantoprazole (PROTONIX) EC tablet 40 mg (not administered)  gabapentin (NEURONTIN) capsule 600 mg (not administered)  pomalidomide (POMALYST) capsule 2 mg (not administered)  zolpidem (AMBIEN) tablet 5 mg (not administered)  citalopram (CELEXA) tablet 40 mg (not administered)  levothyroxine (SYNTHROID, LEVOTHROID) tablet 100 mcg (not administered)  montelukast (SINGULAIR) tablet  10 mg (not administered)  magnesium gluconate (MAGONATE) tablet 500 mg (not administered)  morphine (MS CONTIN) 12 hr tablet 15 mg (not administered)  HYDROcodone-acetaminophen (NORCO) 7.5-325 MG per tablet 1 tablet (not administered)  ALPRAZolam (XANAX) tablet 0.5 mg (not administered)  potassium chloride (K-DUR) CR tablet 10 mEq (not administered)  nystatin (MYCOSTATIN) 100000 UNIT/ML suspension 500,000 Units (not administered)  cyclobenzaprine (FLEXERIL) tablet 10 mg (not administered)  loperamide (IMODIUM) capsule 2 mg (not administered)  aspirin EC tablet 81 mg (not administered)  multivitamin with minerals tablet 1 tablet (not administered)  enoxaparin (LOVENOX) injection 40 mg (not administered)  ondansetron (ZOFRAN) tablet 4 mg (not administered)    Or  ondansetron (ZOFRAN) injection 4 mg (not administered)  ceFEPIme (MAXIPIME) 1 g in dextrose 5 % 50 mL IVPB (not administered)  ipratropium (ATROVENT) nebulizer solution 0.5 mg (not administered)  albuterol (PROVENTIL) (2.5 MG/3ML) 0.083% nebulizer solution 2.5 mg (not administered)  sodium chloride 0.9 % bolus 1,000 mL (0 mLs Intravenous Stopped 01/09/16 2200)  meclizine (ANTIVERT) tablet 25 mg (25 mg Oral Given 01/09/16 1949)  acetaminophen (TYLENOL) tablet 650 mg (650 mg Oral Given 01/09/16 1949)  iopamidol (ISOVUE-370) 76 % injection 100 mL (100 mLs Intravenous Contrast Given 01/09/16 2048)  ceFEPIme (MAXIPIME) 1 g in dextrose 5 % 50 mL IVPB (0 g Intravenous Stopped 01/09/16 2235)  vancomycin (VANCOCIN) IVPB 1000 mg/200 mL premix (0 mg Intravenous Duplicate 09/22/38 1027)  sodium chloride 0.9 % bolus 1,000 mL (1,000 mLs Intravenous New Bag/Given 01/09/16 2205)  magnesium sulfate IVPB 2 g 50 mL (2 g Intravenous Given 01/10/16 0033)    Current Discharge Medication List      Final diagnoses:  HCAP (healthcare-associated pneumonia)  Dizzy spells    This is a 65 y.o. female with a history of multiple myeloma who presents to the  emergency department complaining of dizziness, lightheadedness, and general weakness starting this morning. Patient also complains of worsening dyspnea on exertion that has been ongoing for several years. Patient reports this morning she required to have her hand on her couch when walking. She reports feeling off balance and having room spinning dizziness. She also complains of feeling lightheaded with position change. She reports she feels weak all over. She is followed by oncologist Dr. Julien Nordmann. She is on oral chemotherapy. Patient reports dyspnea on exertion that is worsened today. She denies any chest pain. On exam the patient is afebrile nontoxic appearing. Her lungs are clear to auscultation bilaterally. She has no focal neurological deficits. Troponin is not elevated. CMP is unremarkable. CBC shows leukopenia. BNP is within normal limits. D-dimer is elevated at 1.45. Chest x-ray shows early lingular infiltrate. Head CT shows no acute pathology. It shows lesions compatible with known multiple myeloma that are stable. CT angiogram was obtained due to elevated d-dimer and early lingular pneumonia. That showed no evidence of pulmonary embolism. It did show lingular airspace disease and mild patchy right middle lobe airspace disease consistent with multilobar pneumonia. Will start treatment for HCAP. Will admit for continued monitoring and work up.   I consulted with Dr. Olevia Bowens who accepted the patient for admission. Tele temporary admission orders placed.   This patient was discussed with Dr. Alvino Chapel who agrees with assessment and plan.     Waynetta Pean, [name] 01/10/16 0206  Davonna Belling, MD 01/11/16 [phone]

## 2016-01-09 NOTE — H&P (Signed)
History and Physical    [name] OIZ:[id number] DOB: [birth year] DOA: 01/09/2016  PCP: [name], MD   Patient coming from: Home.  Chief Complaint: Dizziness and weakness.  HPI: [name] is a 65 y.o. female with medical history significant of multiple myeloma on relapse, hyperlipidemia, pathologic compression fracture, hypothyroidism, GERD, hypertension, multiple pneumonia episodes who comes to the emergency department after her oncologist recommended her to seek immediate medical attention due to progressively worse weakness and dizziness.  Per patient, she has been feeling weak for several days. She was seen here a few days ago due to weakness and was found to be hypokalemic with a potassium level of 2.7 mmol per liter. Since then, the patient states that she has continued to have dizziness and weakness, occasional frontal headache, dyspnea with pleuritic chest pain and occasionally productive cough, subjective low-grade fevers and chills. She states that several weeks ago she was treated for cough with prednisone and Levaquin. She denies sick contacts to her knowledge.  She complains of frequent nausea, but no recent vomiting. She gets episodes of diarrhea alternating with occasional constipation. She denies GU symptoms.  ED Course: Workup reveals multilobar pneumonia on CT scan angiogram of the chest. The patient was started on HCAP antibiotic coverage.  Review of Systems: As per HPI otherwise 10 point review of systems negative.  Past Medical History  Diagnosis Date  . Hypercholesterolemia   . Compression fracture 04/08/2007    pathologic compression fracture  . Hypothyroidism   . FHx: chemotherapy     s/p 5 cycle revlimid/low dose decadron,s/p velcade,doxil,decadron,  . Hx of radiation therapy 05/05/07-05/18/07,& 03/05/11-03/21/11-    l3&l5 in 2008, t2-t6 03/2011  . GERD (gastroesophageal reflux disease)   . Insomnia     associated with steroids  .  Constipation     takes oxycontin,vicodin  . Hx of radiation therapy 05/05/2007 to 05/18/2007    palliative, L3-5  . Hx of radiation therapy 03/05/2011 to 03/21/2011    palliative T2-T6, c-spine  . 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 PONV (postoperative nausea and vomiting)   . Multiple myeloma ([place]) dx'd 2009  . Metastasis to bone ([place])   . Family history of anesthesia complication     "daughter gets PONV too"  . Pneumonia     "several times"  . Stroke [facility]) 2014    denies residual on 01/27/2014    Past Surgical History  Procedure Laterality Date  . Knee arthroscopy Right     "put pin in"  . Knee arthroscopy Right     "took pin out and corrected what was wrong"  . Video bronchoscopy  07/30/2011    Procedure: VIDEO BRONCHOSCOPY WITHOUT FLUORO;  Surgeon: Kathee Delton, MD;  Location: [facility];  Service: Cardiopulmonary;  Laterality: Bilateral;  . Cholecystectomy  1980's  . Portacath placement Right 2009  . Vaginal hysterectomy  1980's     reports that she quit smoking about 37 years ago. Her smoking use included Cigarettes. She has a 1.5 pack-year smoking history. She has never used smokeless tobacco. She reports that she does not drink alcohol or use illicit drugs.  No Known Allergies  Family History  Problem Relation Age of Onset  . Lung cancer Brother   . Colon cancer Maternal Uncle   . Breast cancer Maternal Grandmother     Prior to Admission medications   Medication Sig Start Date End Date Taking? Authorizing Provider  ALPRAZolam (XANAX) 0.5 MG tablet TAKE 1/2 TO 1 TABLET 2 TO 3 TIMES DAILY AS NEEDED FOR ANXIETY 11/28/15  Yes Unk Pinto, MD  aspirin 81 MG tablet Take 81 mg by mouth as directed. Mondays, Wednesdays, and Fridays   Yes Historical Provider, MD  citalopram (CELEXA) 40 MG tablet TAKE 1 TABLET (40 MG TOTAL) BY MOUTH DAILY. 12/25/15  Yes Unk Pinto, MD  cyclobenzaprine (FLEXERIL) 10 MG tablet Take 1 tablet  (10 mg total) by mouth 3 (three) times daily as needed for muscle spasms. 10/04/15 10/03/16 Yes Courtney Forcucci, [name]  dexamethasone (DECADRON) 4 MG tablet TAKE (10) TABLETS BY MOUTH EVERY FRIDAY Patient taking differently: TAKE (5) TABLETS BY MOUTH EVERY FRIDAY 11/20/15  Yes Curt Bears, MD  diphenoxylate-atropine (LOMOTIL) 2.5-0.025 MG tablet TAKE 2 TABLETS BY MOUTH 4 TIMES A DAY AS NEEDED FOR DIARRHEA 09/21/15  Yes Susanne Borders, NP  Eszopiclone 3 MG TABS TAKE 1 TABLET BY MOUTH AT BEDTIME AS NEEDED FOR SLEEP 12/26/15  Yes Vicie Mutters, [name]  furosemide (LASIX) 40 MG tablet TAKE 1 TABLET BY MOUTH TWICE A DAY FOR FLUID AND SWELLING 01/03/16  Yes Vicie Mutters, [name]  gabapentin (NEURONTIN) 600 MG tablet TAKE 1 TABLET 4 X DAILY AS NEEDED FOR PAIN OR CRAMPS 01/03/16  Yes Courtney Forcucci, [name]  HYDROcodone-acetaminophen (NORCO) 7.5-325 MG tablet Take 1 tablet by mouth every 6 (six) hours as needed for moderate pain. 11/28/15  Yes Curt Bears, MD  KLOR-CON 10 10 MEQ tablet TAKE 1 TABLET BY MOUTH TWICE A DAY Patient taking differently: TAKE 1 TABLET BY MOUTH THREE TIMES DAILY X1 WEEK. --USUALLY ONLY TAKES TWO DAILY. 10/30/15  Yes Unk Pinto, MD  loperamide (IMODIUM) 2 MG capsule Take 2 mg by mouth as needed for diarrhea or loose stools. Reported on 10/02/2015   Yes Historical Provider, MD  Magnesium 500 MG TABS Take 500 tablets by mouth 2 (two) times daily.    Yes Historical Provider, MD  mometasone (NASONEX) 50 MCG/ACT nasal spray PLACE 2 SPRAYS INTO THE NOSE DAILY. Patient taking differently: PLACE 2 SPRAYS INTO THE NOSE DAILY AS NEEDED FOR ALLERGIES. 01/08/16  Yes Unk Pinto, MD  montelukast (SINGULAIR) 10 MG tablet TAKE 1 TABLET BY MOUTH EVERY DAY 12/11/15  Yes Unk Pinto, MD  morphine (MS CONTIN) 15 MG 12 hr tablet Take 1 tablet (15 mg total) by mouth every 12 (twelve) hours. 11/30/15  Yes Curt Bears, MD  Multiple Vitamin (MULITIVITAMIN WITH MINERALS) TABS Take 1 tablet by mouth  every morning.    Yes Historical Provider, MD  nystatin (MYCOSTATIN) 100000 UNIT/ML suspension Gargle 5 mL PO q6hours and spit out after 20-30 seconds.  Use for 1 week Patient taking differently: Use as directed 5 mLs in the mouth or throat every 6 (six) hours as needed (sore throat/thrush.).  10/18/15  Yes Courtney Forcucci, [name]  ondansetron (ZOFRAN-ODT) 8 MG disintegrating tablet TAKE 1 TABLET BY MOUTH EVERY 8 HOURS AS NEEDED FOR NAUSEA AND VOMITING 12/26/15  Yes Unk Pinto, MD  pantoprazole (PROTONIX) 40 MG tablet TAKE 1 TABLET (40 MG TOTAL) BY MOUTH 2 (TWO) TIMES DAILY. Patient taking differently: Take 40 mg by mouth daily.  01/04/16  Yes Vicie Mutters, [name]  pomalidomide (POMALYST) 2 MG capsule Take 1 capsule (2 mg total) by mouth daily. Take with water on days 1-21. Repeat every 28 days. Patient taking differently: Take 2 mg by mouth daily. Take with water on days 1-21. Repeat every 21 days. 01/01/16  Yes Curt Bears, MD  [place] [facility]  100 MCG tablet Take  Tablet daily on an empty stomach for 30 minutes Patient taking differently: Take 100 mcg by mouth. Take 178mg on an empty stomach once daily on the weekdays and take 533m daily on the weekends 12/17/15  Yes WiUnk PintoMD  benzonatate (TESSALON) 200 MG capsule Take 1 perle 3 x/day to prevent cough Patient not taking: Reported on 01/04/2016 12/08/15   WiUnk PintoMD  predniSONE (DELTASONE) 20 MG tablet 1 tab 3 x day for 3 days, then 1 tab 2 x day for 3 days, then 1 tab 1 x day for 5 days Patient not taking: Reported on 01/04/2016 12/08/15   WiUnk PintoMD    Physical Exam: Filed Vitals:   01/09/16 2014 01/09/16 2130 01/09/16 2130 01/09/16 2300  BP: 108/62 98/62  111/58  Pulse: 79 80  79  Temp:    98.4 F (36.9 C)  TempSrc:    Oral  Resp:  20  18  Height:   5' 4.75" (1.645 m) _0  (1.676 m)  Weight:   95.573 kg (210 lb 11.2 oz) 98.431 kg (217 lb)  SpO2: 99% 97%  100%      Constitutional: NAD, calm, comfortable Filed  Vitals:   01/09/16 2014 01/09/16 2130 01/09/16 2130 01/09/16 2300  BP: 108/62 98/62  111/58  Pulse: 79 80  79  Temp:    98.4 F (36.9 C)  TempSrc:    Oral  Resp:  20  18  Height:   5' 4.75" (1.645 m) _1  (1.676 m)  Weight:   95.573 kg (210 lb 11.2 oz) 98.431 kg (217 lb)  SpO2: 99% 97%  100%   Eyes: PERRL, lids and conjunctivae normal ENMT: Mucous membranes are mildly dry. Posterior pharynx clear of any exudate or lesions.Multiple cavities with filings. Neck: normal, supple, no masses, no thyromegaly Respiratory: Bibasilar crackles, mild bilateral rhonchi, no wheezing, no crackles.                      No accessory muscle use.  Cardiovascular: Regular rate and rhythm, no murmurs / rubs / gallops. 1+ extremity edema. Right Port-A-Cath, 2+ pedal pulses. No carotid bruits.  Abdomen: Obese, Midline and RLQ surgical scars, no tenderness, no masses palpated. No hepatosplenomegaly. Bowel sounds positive.  Musculoskeletal: no clubbing / cyanosis. No joint deformity upper and lower extremities. Good ROM, no contractures. Normal muscle tone.  Skin: no rashes, lesions, ulcers on limited skin exam. Neurologic: CN 2-12 grossly intact. Sensation intact, DTR normal. Strength 5/5 in all 4.  Psychiatric: Normal judgment and insight. Alert and oriented x 4. Normal mood.     Labs on Admission: I have personally reviewed following labs and imaging studies  CBC:  Recent Labs Lab 01/04/16 1830 01/09/16 1630  WBC 3.1* 2.7*  NEUTROABS  --  1.4*  HGB 9.8* 10.3*  HCT 29.4* 32.9*  MCV 102.8* 106.8*  PLT 228 25485 Basic Metabolic Panel:  Recent Labs Lab 01/04/16 1830 01/09/16 1630  NA 139 138  K 2.7* 3.6  CL 106 102  CO2 26 29  GLUCOSE 92 107*  BUN 8 10  CREATININE 0.76 0.77  CALCIUM 8.0* 8.8*  MG 1.3* 1.6*   GFR: Estimated Creatinine Clearance: 84 mL/min (by C-G formula based on Cr of 0.77). Liver Function Tests:  Recent Labs Lab 01/04/16 1830 01/09/16 1630  AST 18 22  ALT 16  19  ALKPHOS 58 78  BILITOT 1.1 0.6  PROT 6.8 6.9  ALBUMIN 3.6 3.5   Urine analysis:    Component Value Date/Time   COLORURINE YELLOW 05/05/2015 1535   APPEARANCEUR CLOUDY* 05/05/2015 1535   LABSPEC 1.018 05/05/2015 1535   PHURINE 6.5 05/05/2015 1535   GLUCOSEU NEGATIVE 05/05/2015 1535   HGBUR NEGATIVE 05/05/2015 1535   BILIRUBINUR NEGATIVE 05/05/2015 1535   KETONESUR NEGATIVE 05/05/2015 1535   PROTEINUR NEGATIVE 05/05/2015 1535   UROBILINOGEN 1.0 05/05/2015 1535   NITRITE NEGATIVE 05/05/2015 1535   LEUKOCYTESUR TRACE* 05/05/2015 1535    Radiological Exams on Admission: Dg Chest 2 View  01/09/2016  CLINICAL DATA:  Dizziness EXAM: CHEST  2 VIEW COMPARISON:  10/31/2015 FINDINGS: Cardiac shadow is within normal limits. A right chest wall port is again seen. The lungs are well aerated bilaterally. Mild increased density is noted in the lingula consistent with early infiltrate. No other focal abnormality is noted. IMPRESSION: Early lingular infiltrate. Electronically Signed   By: Inez Catalina M.D.   On: 01/09/2016 19:42   Ct Head Wo Contrast  01/09/2016  CLINICAL DATA:  65 year old female with history of multiple myeloma presenting with weakness and dizziness. History of brain tumor with radiation treatment. EXAM: CT HEAD WITHOUT CONTRAST TECHNIQUE: Contiguous axial images were obtained from the base of the skull through the vertex without intravenous contrast. COMPARISON:  Head CT dated 08/09/2013 and MRI dated 01/04/2016 FINDINGS: The ventricles and the sulci are appropriate in size for the patient's age. There is no intracranial hemorrhage. No midline shift or mass effect identified. The gray-white matter differentiation is preserved. Multiple bilateral calvarial lytic lesion with the largest in the right parietal calvarium similar to prior study and compatible with known multiple myeloma. No acute calvarial fracture. IMPRESSION: No acute intracranial pathology. Calvarial lytic lesions  compatible with known multiple myeloma. These lesions are stable since 08/09/2013. Electronically Signed   By: Anner Crete M.D.   On: 01/09/2016 20:03   Ct Angio Chest Pe W/cm &/or Wo Cm  01/09/2016  CLINICAL DATA:  Shortness of breath, history of multiple myeloma EXAM: CT ANGIOGRAPHY CHEST WITH CONTRAST TECHNIQUE: Multidetector CT imaging of the chest was performed using the standard protocol during bolus administration of intravenous contrast. Multiplanar CT image reconstructions and MIPs were obtained to evaluate the vascular anatomy. CONTRAST:  100 mL Isovue 370 COMPARISON:  None. FINDINGS: Mediastinum/Lymph Nodes: No pulmonary emboli or thoracic aortic dissection identified. No masses or pathologically enlarged lymph nodes identified. Lungs/Pleura: There is lingular airspace disease. There is mild patchy right middle lobe airspace disease. There is a small area of consolidation in the posterior left lower lobe. Upper abdomen: No acute findings. Musculoskeletal: No chest wall mass or suspicious bone lesions identified. Review of the MIP images confirms the above findings. IMPRESSION: 1. No evidence of pulmonary embolus. 2. Lingular airspace disease. Mild patchy right middle lobe airspace disease. Small area of consolidation in the posterior left lower lobe. Overall findings are concerning for multilobar numb Electronically Signed   By: Kathreen Devoid   On: 01/09/2016 21:13    EKG: Independently reviewed. Vent. rate 93 BPM PR interval * ms QRS duration 75 ms QT/QTc 473/589 ms P-R-T axes 56 -2 37 Sinus rhythm Borderline T abnormalities, anterior leads Prolonged QT interval  Assessment/Plan Principal Problem:   HCAP (healthcare-associated pneumonia) Admit to telemetry/inpatient. Continue supplemental oxygen. Continue broad-spectrum IV antibiotics. Bronchodilators as needed. Follow-up blood cultures and sensitivity. Check a sputum Gram stain, culture and sensitivity. Check Legionella and  strep pneumoniae urine antigens.  Active Problems:   Multiple myeloma  in relapse ([place]) Hold Pomalidomide while the patient is being treated for pneumonia. Resume treatment per oncology team. Continue MS Contin 15 mg by mouth twice a day and when necessary hydrocodone for pain management.    Hypothyroidism Continue levothyroxine 100 g by mouth daily. Monitor TSH periodically.    GERD Pantoprazole 40 mg by mouth daily.    Essential hypertension Resume furosemide    Hyperlipidemia Carbohydrate modified/heart healthy diet.    Hypomagnesemia Replacing.   DVT prophylaxis: Lovenox. Code Status: Full code. Family Communication:  Disposition Plan:  Consults called:  Admission status: Inpatient/telemetry.   Reubin Milan MD [facility] Pager [phone].  If 7PM-7AM, please contact night-coverage [email or web address] Password [facility]  01/09/2016, 11:43 PM

## 2016-01-09 NOTE — Telephone Encounter (Signed)
Pt stated Dr Arloa Koh saw her last wed and recommended  that she needed physical therapy .She was referred to [facility] PT but pt needs local PT . Note to [place]. PT has rx for PT . Pt then tells me about her ED visit last Thursday for rectal bleeding . She states she has not had any bleeding since ED. She does report a little dizzy this am when she first got up and is tired. Onc Tx request sent for labs on wed.

## 2016-01-09 NOTE — ED Notes (Addendum)
Patient is being treated with oral chemo for multiple myeloma.  This morning she started having dizziness and weakness. Patient was seen here last week and discharged home.  Today when started feeling badly, she called her oncologist and they sent her here.  Her K+ on Friday was 2.7.

## 2016-01-09 NOTE — ED Notes (Signed)
Pt transported to XR.  

## 2016-01-09 NOTE — ED Notes (Signed)
Below order not completed by EW. 

## 2016-01-10 ENCOUNTER — Ambulatory Visit (HOSPITAL_COMMUNITY): Payer: Medicare Other

## 2016-01-10 ENCOUNTER — Other Ambulatory Visit: Payer: Self-pay

## 2016-01-10 ENCOUNTER — Telehealth: Payer: Self-pay | Admitting: Medical Oncology

## 2016-01-10 DIAGNOSIS — R0602 Shortness of breath: Secondary | ICD-10-CM

## 2016-01-10 LAB — STREP PNEUMONIAE URINARY ANTIGEN: STREP PNEUMO URINARY ANTIGEN: NEGATIVE

## 2016-01-10 LAB — COMPREHENSIVE METABOLIC PANEL
ALT: 13 U/L — ABNORMAL LOW (ref 14–54)
ANION GAP: 5 (ref 5–15)
AST: 22 U/L (ref 15–41)
Albumin: 2.8 g/dL — ABNORMAL LOW (ref 3.5–5.0)
Alkaline Phosphatase: 61 U/L (ref 38–126)
BILIRUBIN TOTAL: 0.5 mg/dL (ref 0.3–1.2)
BUN: 7 mg/dL (ref 6–20)
CHLORIDE: 105 mmol/L (ref 101–111)
CO2: 27 mmol/L (ref 22–32)
Calcium: 7.7 mg/dL — ABNORMAL LOW (ref 8.9–10.3)
Creatinine, Ser: 0.78 mg/dL (ref 0.44–1.00)
Glucose, Bld: 130 mg/dL — ABNORMAL HIGH (ref 65–99)
POTASSIUM: 3.7 mmol/L (ref 3.5–5.1)
Sodium: 137 mmol/L (ref 135–145)
TOTAL PROTEIN: 5.7 g/dL — AB (ref 6.5–8.1)

## 2016-01-10 LAB — CBC WITH DIFFERENTIAL/PLATELET
BASOS ABS: 0 10*3/uL (ref 0.0–0.1)
Basophils Relative: 1 %
EOS PCT: 4 %
Eosinophils Absolute: 0.1 10*3/uL (ref 0.0–0.7)
HCT: 27 % — ABNORMAL LOW (ref 36.0–46.0)
Hemoglobin: 8.8 g/dL — ABNORMAL LOW (ref 12.0–15.0)
LYMPHS ABS: 0.8 10*3/uL (ref 0.7–4.0)
LYMPHS PCT: 35 %
MCH: 34.6 pg — AB (ref 26.0–34.0)
MCHC: 32.6 g/dL (ref 30.0–36.0)
MCV: 106.3 fL — AB (ref 78.0–100.0)
MONO ABS: 0.1 10*3/uL (ref 0.1–1.0)
MONOS PCT: 6 %
Neutro Abs: 1.3 10*3/uL — ABNORMAL LOW (ref 1.7–7.7)
Neutrophils Relative %: 54 %
PLATELETS: 236 10*3/uL (ref 150–400)
RBC: 2.54 MIL/uL — ABNORMAL LOW (ref 3.87–5.11)
RDW: 16.7 % — AB (ref 11.5–15.5)
WBC: 2.3 10*3/uL — ABNORMAL LOW (ref 4.0–10.5)

## 2016-01-10 MED ORDER — CITALOPRAM HYDROBROMIDE 20 MG PO TABS
40.0000 mg | ORAL_TABLET | Freq: Every day | ORAL | Status: DC
  Administered 2016-01-10 – 2016-01-12 (×3): 40 mg via ORAL
  Filled 2016-01-10 (×3): qty 2

## 2016-01-10 MED ORDER — DEXTROSE 5 % IV SOLN
1.0000 g | Freq: Three times a day (TID) | INTRAVENOUS | Status: DC
  Administered 2016-01-10 – 2016-01-12 (×7): 1 g via INTRAVENOUS
  Filled 2016-01-10 (×9): qty 1

## 2016-01-10 MED ORDER — HYDROCODONE-ACETAMINOPHEN 7.5-325 MG PO TABS
1.0000 | ORAL_TABLET | Freq: Four times a day (QID) | ORAL | Status: DC | PRN
  Administered 2016-01-10 – 2016-01-11 (×2): 1 via ORAL
  Filled 2016-01-10 (×3): qty 1

## 2016-01-10 MED ORDER — LOPERAMIDE HCL 2 MG PO CAPS: 2.0000 mg | ORAL_CAPSULE | ORAL | Status: DC | PRN

## 2016-01-10 MED ORDER — GABAPENTIN 300 MG PO CAPS
600.0000 mg | ORAL_CAPSULE | Freq: Four times a day (QID) | ORAL | Status: DC | PRN
  Administered 2016-01-10 – 2016-01-12 (×5): 600 mg via ORAL
  Filled 2016-01-10 (×5): qty 2

## 2016-01-10 MED ORDER — LEVOTHYROXINE SODIUM 100 MCG PO TABS
100.0000 ug | ORAL_TABLET | Freq: Every day | ORAL | Status: DC
  Administered 2016-01-10 – 2016-01-12 (×3): 100 ug via ORAL
  Filled 2016-01-10 (×3): qty 1

## 2016-01-10 MED ORDER — POMALIDOMIDE 2 MG PO CAPS: 2.0000 mg | ORAL_CAPSULE | Freq: Every day | ORAL | Status: DC

## 2016-01-10 MED ORDER — ALBUTEROL SULFATE (2.5 MG/3ML) 0.083% IN NEBU: 2.5000 mg | INHALATION_SOLUTION | Freq: Four times a day (QID) | RESPIRATORY_TRACT | Status: DC | PRN

## 2016-01-10 MED ORDER — ONDANSETRON HCL 4 MG PO TABS: 4.0000 mg | ORAL_TABLET | Freq: Four times a day (QID) | ORAL | Status: DC | PRN

## 2016-01-10 MED ORDER — MONTELUKAST SODIUM 10 MG PO TABS
10.0000 mg | ORAL_TABLET | Freq: Every day | ORAL | Status: DC
  Administered 2016-01-10 – 2016-01-12 (×3): 10 mg via ORAL
  Filled 2016-01-10 (×3): qty 1

## 2016-01-10 MED ORDER — NYSTATIN 100000 UNIT/ML MT SUSP: 5.0000 mL | Freq: Four times a day (QID) | OROMUCOSAL | Status: DC | PRN

## 2016-01-10 MED ORDER — IPRATROPIUM BROMIDE 0.02 % IN SOLN: 0.5000 mg | Freq: Four times a day (QID) | RESPIRATORY_TRACT | Status: DC | PRN

## 2016-01-10 MED ORDER — ADULT MULTIVITAMIN W/MINERALS CH
1.0000 | ORAL_TABLET | Freq: Every morning | ORAL | Status: DC
  Administered 2016-01-10 – 2016-01-12 (×3): 1 via ORAL
  Filled 2016-01-10 (×3): qty 1

## 2016-01-10 MED ORDER — ASPIRIN EC 81 MG PO TBEC
81.0000 mg | DELAYED_RELEASE_TABLET | ORAL | Status: DC
  Administered 2016-01-10 – 2016-01-12 (×2): 81 mg via ORAL
  Filled 2016-01-10 (×2): qty 1

## 2016-01-10 MED ORDER — CYCLOBENZAPRINE HCL 10 MG PO TABS: 10.0000 mg | ORAL_TABLET | Freq: Three times a day (TID) | ORAL | Status: DC | PRN

## 2016-01-10 MED ORDER — ENOXAPARIN SODIUM 40 MG/0.4ML ~~LOC~~ SOLN
40.0000 mg | SUBCUTANEOUS | Status: DC
  Administered 2016-01-10 – 2016-01-11 (×2): 40 mg via SUBCUTANEOUS
  Filled 2016-01-10 (×2): qty 0.4

## 2016-01-10 MED ORDER — SODIUM CHLORIDE 0.9% FLUSH
10.0000 mL | INTRAVENOUS | Status: DC | PRN
  Administered 2016-01-10 – 2016-01-12 (×2): 10 mL
  Filled 2016-01-10 (×2): qty 40

## 2016-01-10 MED ORDER — PANTOPRAZOLE SODIUM 40 MG PO TBEC
40.0000 mg | DELAYED_RELEASE_TABLET | Freq: Every day | ORAL | Status: DC
  Administered 2016-01-10 – 2016-01-12 (×3): 40 mg via ORAL
  Filled 2016-01-10 (×3): qty 1

## 2016-01-10 MED ORDER — POTASSIUM CHLORIDE CRYS ER 10 MEQ PO TBCR
10.0000 meq | EXTENDED_RELEASE_TABLET | Freq: Two times a day (BID) | ORAL | Status: DC
  Administered 2016-01-10 – 2016-01-12 (×6): 10 meq via ORAL
  Filled 2016-01-10 (×12): qty 1

## 2016-01-10 MED ORDER — ALPRAZOLAM 0.5 MG PO TABS: 0.5000 mg | ORAL_TABLET | Freq: Three times a day (TID) | ORAL | Status: DC | PRN

## 2016-01-10 MED ORDER — ZOLPIDEM TARTRATE 5 MG PO TABS
5.0000 mg | ORAL_TABLET | Freq: Every evening | ORAL | Status: DC | PRN
  Administered 2016-01-11: 5 mg via ORAL
  Filled 2016-01-10: qty 1

## 2016-01-10 MED ORDER — MORPHINE SULFATE ER 15 MG PO TBCR
15.0000 mg | EXTENDED_RELEASE_TABLET | Freq: Two times a day (BID) | ORAL | Status: DC
  Administered 2016-01-10 – 2016-01-12 (×6): 15 mg via ORAL
  Filled 2016-01-10 (×6): qty 1

## 2016-01-10 MED ORDER — ONDANSETRON HCL 4 MG/2ML IJ SOLN: 4.0000 mg | Freq: Four times a day (QID) | INTRAMUSCULAR | Status: DC | PRN

## 2016-01-10 MED ORDER — MAGNESIUM GLUCONATE 500 MG PO TABS
500.0000 mg | ORAL_TABLET | Freq: Two times a day (BID) | ORAL | Status: DC
  Administered 2016-01-10 – 2016-01-12 (×6): 500 mg via ORAL
  Filled 2016-01-10 (×6): qty 1

## 2016-01-10 NOTE — Progress Notes (Signed)
*  PRELIMINARY RESULTS* Vascular Ultrasound Lower extremity venous duplex has been completed.  Preliminary findings: No evidence of DVT or baker's cyst.   Landry Mellow, RDMS, RVT  01/10/2016, 4:09 PM

## 2016-01-10 NOTE — Care Management Note (Signed)
Case Management Note  Patient Details  Name: [name] MRN: [id number] Date of Birth: 1950-07-05  Subjective/Objective: 65 y/o f admitted w/PNA. From home.                   Action/Plan:d/c plan home.   Expected Discharge Date:                  Expected Discharge Plan:  Home/Self Care  In-House Referral:     Discharge planning Services  CM Consult  Post Acute Care Choice:    Choice offered to:     DME Arranged:    DME Agency:     HH Arranged:    HH Agency:     Status of Service:  In process, will continue to follow  If discussed at Long Length of Stay Meetings, dates discussed:    Additional Comments:  Dessa Phi, RN 01/10/2016, 3:08 PM

## 2016-01-10 NOTE — Telephone Encounter (Signed)
Updated on admission.

## 2016-01-10 NOTE — Telephone Encounter (Signed)
Pt not available

## 2016-01-10 NOTE — Progress Notes (Signed)
Pharmacy Brief note:  Pomalyst  By [place] is automatically held when any of the following lab values occurs:  Pomalidomide (Pomalyst) hold criteria:  ANC < 500  ANC < 1000 with fever 38.5C or above  Platelets < 25K  Bilirubin > 2 mg/dL  AST/ALT > 3x ULN  SCr > 3 mg/dL  Acute venous thromboembolic event  Active infection  Patient is admitted with an active infection therefore pharmacy will hold (discontinue from profile) per policy.  Thank Dorrene German 01/10/2016 1:57 AM

## 2016-01-10 NOTE — Progress Notes (Signed)
PROGRESS NOTE                                                                                                                                                                                                             Patient Demographics:    [name], is a 65 y.o. female, DOB - 1951/05/24, WHQ:[id number]  Admit date - 01/09/2016   Admitting Physician Reubin Milan, MD  Outpatient Primary MD for the patient is [name], MD  LOS - 1  Chief Complaint  Patient presents with  . Dizziness       Brief Narrative    [name] is a 65 y.o. female with medical history significant of multiple myeloma on relapse, hyperlipidemia, pathologic compression fracture, hypothyroidism, GERD, hypertension, multiple pneumonia episodes who comes to the emergency department after her oncologist recommended her to seek immediate medical attention due to progressively worse weakness and dizziness.  Per patient, she has been feeling weak for several days. She was seen here a few days ago due to weakness and was found to be hypokalemic with a potassium level of 2.7 mmol per liter. Since then, the patient states that she has continued to have dizziness and weakness, occasional frontal headache, dyspnea with pleuritic chest pain and occasionally productive cough, subjective low-grade fevers and chills. She states that several weeks ago she was treated for cough with prednisone and Levaquin. She denies sick contacts to her knowledge.  She complains of frequent nausea, but no recent vomiting. She gets episodes of diarrhea alternating with occasional constipation. She denies GU symptoms.  ED Course: Workup reveals multilobar pneumonia on CT scan angiogram of the chest. The patient was started on HCAP antibiotic coverage.    Subjective:    [name] today has, No headache, No chest pain, No abdominal pain - No  Nausea, No new weakness tingling or numbness, No Improved cough and SOB.    Assessment  & Plan :     1.Pneumonia. Started on broad-spectrum antibiotics which will be continued for 24 hours, if better we'll transition to Levaquin oral tomorrow, already looks and feels better, no signs of sepsis, follow cultures along with urine legionella and strep pneumo antigen. Continue supportive care for now.  2. History of multiple myeloma. Resume chemotherapy after discharge and follow with Dr. Julien Nordmann.  3. Hypothyroidism. On Synthroid.  4. GERD. On PPI.  5. Chronic grade 1 diastolic dysfunction last EF 55-60% on recent echocardiogram. Compensated. Resume Lasix from 01/11/2016.    Family Communication  :  None  Code Status : Full  Diet : Heart Healthy  Disposition Plan  :  Home in 1-2 days  Consults  :  None  Procedures  :     CT angiogram chest. Pneumonia  CT head nonacute  Lower extremity venous duplex  DVT Prophylaxis  :  Lovenox    Lab Results  Component Value Date   PLT 236 01/10/2016    Inpatient Medications  Scheduled Meds: . aspirin EC  81 mg Oral Q M,W,F  . ceFEPime (MAXIPIME) IV  1 g Intravenous Q8H  . citalopram  40 mg Oral Daily  . enoxaparin (LOVENOX) injection  40 mg Subcutaneous Q24H  . levothyroxine  100 mcg Oral QAC breakfast  . magnesium gluconate  500 mg Oral BID  . montelukast  10 mg Oral Daily  . morphine  15 mg Oral Q12H  . multivitamin with minerals  1 tablet Oral q morning - 10a  . pantoprazole  40 mg Oral Daily  . potassium chloride  10 mEq Oral BID  . vancomycin  1,250 mg Intravenous Q12H   Continuous Infusions:  PRN Meds:.albuterol, ALPRAZolam, cyclobenzaprine, gabapentin, HYDROcodone-acetaminophen, ipratropium, loperamide, nystatin, ondansetron **OR** ondansetron (ZOFRAN) IV, sodium chloride flush, zolpidem  Antibiotics  :    Anti-infectives    Start     Dose/Rate Route Frequency Ordered Stop   01/10/16 0800  vancomycin (VANCOCIN)  1,250 mg in sodium chloride 0.9 % 250 mL IVPB     1,250 mg 166.7 mL/hr over 90 Minutes Intravenous Every 12 hours 01/09/16 2140     01/10/16 0600  ceFEPIme (MAXIPIME) 1 g in dextrose 5 % 50 mL IVPB     1 g 100 mL/hr over 30 Minutes Intravenous Every 8 hours 01/10/16 0128 01/18/16 0559   01/09/16 2200  ceFEPIme (MAXIPIME) 1 g in dextrose 5 % 50 mL IVPB     1 g 100 mL/hr over 30 Minutes Intravenous  Once 01/09/16 2129 01/09/16 2235   01/09/16 2200  vancomycin (VANCOCIN) IVPB 1000 mg/200 mL premix     1,000 mg 200 mL/hr over 60 Minutes Intravenous NOW 01/09/16 2140 01/10/16 0010         Objective:   Filed Vitals:   01/09/16 2130 01/09/16 2130 01/09/16 2300 01/10/16 0535  BP: 98/62  111/58 117/53  Pulse: 80  79 72  Temp:   98.4 F (36.9 C) 97.6 F (36.4 C)  TempSrc:   Oral Oral  Resp: _0 Height:  5' 4.75" (1.645 m) _1  (1.676 m)   Weight:  95.573 kg (210 lb 11.2 oz) 98.431 kg (217 lb)   SpO2: 97%  100% 100%    Wt Readings from Last 3 Encounters:  01/09/16 98.431 kg (217 lb)  12/28/15 95.573 kg (210 lb 11.2 oz)  12/08/15 94.439 kg (208 lb 3.2 oz)     Intake/Output Summary (Last 24 hours) at 01/10/16 1202 Last data filed at 01/10/16 1000  Gross per 24 hour  Intake    600 ml  Output      0 ml  Net    600 ml     Physical Exam  Awake Alert, Oriented X 3, No new F.N deficits, Normal affect [place].AT,PERRAL Supple Neck,No JVD, No cervical lymphadenopathy appriciated.  Symmetrical Chest wall movement, Good air movement bilaterally, few rales  RRR,No Gallops,Rubs or new Murmurs, No Parasternal Heave +ve B.Sounds, Abd Soft, No tenderness, No organomegaly appriciated, No rebound - guarding or rigidity. No Cyanosis, Clubbing or edema, No new Rash or bruise       Data Review:    CBC  Recent Labs Lab 01/04/16 1830 01/09/16 1630 01/10/16 0550  WBC 3.1* 2.7* 2.3*  HGB 9.8* 10.3* 8.8*  HCT 29.4* 32.9* 27.0*  PLT 228 250 236  MCV 102.8* 106.8* 106.3*  MCH  34.3* 33.4 34.6*  MCHC 33.3 31.3 32.6  RDW 15.6* 16.5* 16.7*  LYMPHSABS  --  1.0 0.8  MONOABS  --  0.2 0.1  EOSABS  --  0.1 0.1  BASOSABS  --  0.0 0.0    Chemistries   Recent Labs Lab 01/04/16 1830 01/09/16 1630 01/10/16 0550  NA 139 138 137  K 2.7* 3.6 3.7  CL 106 102 105  CO2 _0 GLUCOSE 92 107* 130*  BUN _1 CREATININE 0.76 0.77 0.78  CALCIUM 8.0* 8.8* 7.7*  MG 1.3* 1.6*  --   AST _2 ALT 16 19 13*  ALKPHOS 58 78 61  BILITOT 1.1 0.6 0.5   ------------------------------------------------------------------------------------------------------------------ No results for input(s): CHOL, HDL, LDLCALC, TRIG, CHOLHDL, LDLDIRECT in the last 72 hours.  Lab Results  Component Value Date   HGBA1C 6.0* 10/03/2015   ------------------------------------------------------------------------------------------------------------------ No results for input(s): TSH, T4TOTAL, T3FREE, THYROIDAB in the last 72 hours.  Invalid input(s): FREET3 ------------------------------------------------------------------------------------------------------------------ No results for input(s): VITAMINB12, FOLATE, FERRITIN, TIBC, IRON, RETICCTPCT in the last 72 hours.  Coagulation profile No results for input(s): INR, PROTIME in the last 168 hours.   Recent Labs  01/09/16 1630  DDIMER 1.45*    Cardiac Enzymes No results for input(s): CKMB, TROPONINI, MYOGLOBIN in the last 168 hours.  Invalid input(s): CK ------------------------------------------------------------------------------------------------------------------    Component Value Date/Time   BNP 45.8 01/09/2016 1630   BNP 33.0 09/09/2014 1130    Micro Results No results found for this or any previous visit (from the past 240 hour(s)).  Radiology Reports Dg Chest 2 View  01/09/2016  CLINICAL DATA:  Dizziness EXAM: CHEST  2 VIEW COMPARISON:  10/31/2015 FINDINGS: Cardiac shadow is within normal limits. A right  chest wall port is again seen. The lungs are well aerated bilaterally. Mild increased density is noted in the lingula consistent with early infiltrate. No other focal abnormality is noted. IMPRESSION: Early lingular infiltrate. Electronically Signed   By: Inez Catalina M.D.   On: 01/09/2016 19:42   Ct Head Wo Contrast  01/09/2016  CLINICAL DATA:  65 year old female with history of multiple myeloma presenting with weakness and dizziness. History of brain tumor with radiation treatment. EXAM: CT HEAD WITHOUT CONTRAST TECHNIQUE: Contiguous axial images were obtained from the base of the skull through the vertex without intravenous contrast. COMPARISON:  Head CT dated 08/09/2013 and MRI dated 01/04/2016 FINDINGS: The ventricles and the sulci are appropriate in size for the patient's age. There is no intracranial hemorrhage. No midline shift or mass effect identified. The gray-white matter differentiation is preserved. Multiple bilateral calvarial lytic lesion with the largest in the right parietal calvarium similar to prior study and compatible with known multiple myeloma. No acute calvarial fracture. IMPRESSION: No acute intracranial pathology. Calvarial lytic lesions compatible with known multiple myeloma. These lesions are stable since 08/09/2013. Electronically Signed   By: Anner Crete M.D.   On: 01/09/2016 20:03   Ct Angio Chest Pe W/cm &/or Wo  Cm  01/09/2016  CLINICAL DATA:  Shortness of breath, history of multiple myeloma EXAM: CT ANGIOGRAPHY CHEST WITH CONTRAST TECHNIQUE: Multidetector CT imaging of the chest was performed using the standard protocol during bolus administration of intravenous contrast. Multiplanar CT image reconstructions and MIPs were obtained to evaluate the vascular anatomy. CONTRAST:  100 mL Isovue 370 COMPARISON:  None. FINDINGS: Mediastinum/Lymph Nodes: No pulmonary emboli or thoracic aortic dissection identified. No masses or pathologically enlarged lymph nodes identified.  Lungs/Pleura: There is lingular airspace disease. There is mild patchy right middle lobe airspace disease. There is a small area of consolidation in the posterior left lower lobe. Upper abdomen: No acute findings. Musculoskeletal: No chest wall mass or suspicious bone lesions identified. Review of the MIP images confirms the above findings. IMPRESSION: 1. No evidence of pulmonary embolus. 2. Lingular airspace disease. Mild patchy right middle lobe airspace disease. Small area of consolidation in the posterior left lower lobe. Overall findings are concerning for multilobar numb Electronically Signed   By: Kathreen Devoid   On: 01/09/2016 21:13   Mr Brain W Wo Contrast  01/04/2016  CLINICAL DATA:  Multiple myeloma.  Skull lesion.  Confusion. EXAM: MRI HEAD WITHOUT AND WITH CONTRAST TECHNIQUE: Multiplanar, multiecho pulse sequences of the brain and surrounding structures were obtained without and with intravenous contrast. CONTRAST:  9m MULTIHANCE GADOBENATE DIMEGLUMINE 529 MG/ML IV SOLN COMPARISON:  08/24/2015. FINDINGS: No evidence for acute infarction, hemorrhage, mass lesion, hydrocephalus, or extra-axial fluid. Normal cerebral volume. Mild subcortical and periventricular T2 and FLAIR hyperintensities, likely chronic microvascular ischemic change. Pituitary, pineal, and cerebellar tonsils unremarkable. No upper cervical lesions. Post infusion, abnormal pachymeningeal enhancement is redemonstrated, greatest associated with the RIGHT parietal lesion representing metastatic melanoma. The lesion is essentially unchanged in size, measuring 10 x 34 x 35 mm (R-L x A-P x C-C). Smaller focal calvarial lesions are seen in the LEFT parietal bone, and in the midline posteriorly. Overall, no significant change from priors. No midline shift or significant adjacent vasogenic edema. No sinus or mastoid disease. Negative orbits. Clival heterogeneity and upper cervical heterogeneity along with a permeative appearance to the diploic  space, representing diffuse osseous disease. IMPRESSION: Stable findings of multiple myeloma. Mild RIGHT parietal pachymeningeal enhancement adjacent to the largest deposit, but no intra-axial edema or parenchymal lesions. No acute intracranial abnormality. Electronically Signed   By: JStaci RighterM.D.   On: 01/04/2016 08:35    Time Spent in minutes  30   Fabien Travelstead K M.D on 01/10/2016 at 12:02 PM  Between 7am to 7pm - Pager - [phone] After 7pm go to [email or web address] - password [email or web address] -  Office  [phone]

## 2016-01-11 LAB — CBC WITH DIFFERENTIAL/PLATELET
Basophils Absolute: 0 10*3/uL (ref 0.0–0.1)
Basophils Relative: 1 %
EOS PCT: 3 %
Eosinophils Absolute: 0.1 10*3/uL (ref 0.0–0.7)
HEMATOCRIT: 27.2 % — AB (ref 36.0–46.0)
Hemoglobin: 8.7 g/dL — ABNORMAL LOW (ref 12.0–15.0)
LYMPHS PCT: 33 %
Lymphs Abs: 1 10*3/uL (ref 0.7–4.0)
MCH: 33.9 pg (ref 26.0–34.0)
MCHC: 32 g/dL (ref 30.0–36.0)
MCV: 105.8 fL — AB (ref 78.0–100.0)
MONO ABS: 0.2 10*3/uL (ref 0.1–1.0)
MONOS PCT: 7 %
NEUTROS ABS: 1.7 10*3/uL (ref 1.7–7.7)
Neutrophils Relative %: 56 %
PLATELETS: 213 10*3/uL (ref 150–400)
RBC: 2.57 MIL/uL — ABNORMAL LOW (ref 3.87–5.11)
RDW: 16.3 % — AB (ref 11.5–15.5)
WBC: 3 10*3/uL — ABNORMAL LOW (ref 4.0–10.5)

## 2016-01-11 LAB — URINALYSIS, ROUTINE W REFLEX MICROSCOPIC
Bilirubin Urine: NEGATIVE
Glucose, UA: NEGATIVE mg/dL
HGB URINE DIPSTICK: NEGATIVE
Ketones, ur: NEGATIVE mg/dL
LEUKOCYTES UA: NEGATIVE
NITRITE: NEGATIVE
PROTEIN: NEGATIVE mg/dL
Specific Gravity, Urine: 1.008 (ref 1.005–1.030)
pH: 5 (ref 5.0–8.0)

## 2016-01-11 LAB — LEGIONELLA PNEUMOPHILA SEROGP 1 UR AG: L. PNEUMOPHILA SEROGP 1 UR AG: NEGATIVE

## 2016-01-11 LAB — COMPREHENSIVE METABOLIC PANEL
ALT: 15 U/L (ref 14–54)
ANION GAP: 3 — AB (ref 5–15)
AST: 20 U/L (ref 15–41)
Albumin: 2.8 g/dL — ABNORMAL LOW (ref 3.5–5.0)
Alkaline Phosphatase: 77 U/L (ref 38–126)
BILIRUBIN TOTAL: 0.3 mg/dL (ref 0.3–1.2)
BUN: 9 mg/dL (ref 6–20)
CHLORIDE: 108 mmol/L (ref 101–111)
CO2: 26 mmol/L (ref 22–32)
Calcium: 8 mg/dL — ABNORMAL LOW (ref 8.9–10.3)
Creatinine, Ser: 0.79 mg/dL (ref 0.44–1.00)
Glucose, Bld: 122 mg/dL — ABNORMAL HIGH (ref 65–99)
POTASSIUM: 3.9 mmol/L (ref 3.5–5.1)
Sodium: 137 mmol/L (ref 135–145)
TOTAL PROTEIN: 5.7 g/dL — AB (ref 6.5–8.1)

## 2016-01-11 MED ORDER — DEXAMETHASONE 4 MG PO TABS
4.0000 mg | ORAL_TABLET | Freq: Once | ORAL | Status: AC
  Administered 2016-01-11: 4 mg via ORAL
  Filled 2016-01-11: qty 1

## 2016-01-11 MED ORDER — FUROSEMIDE 40 MG PO TABS
40.0000 mg | ORAL_TABLET | Freq: Every day | ORAL | Status: DC
  Administered 2016-01-11 – 2016-01-12 (×2): 40 mg via ORAL
  Filled 2016-01-11 (×2): qty 1

## 2016-01-11 MED ORDER — FUROSEMIDE 40 MG PO TABS: 40.0000 mg | ORAL_TABLET | Freq: Every day | ORAL | Status: DC

## 2016-01-11 MED ORDER — FLUCONAZOLE 100 MG PO TABS
100.0000 mg | ORAL_TABLET | Freq: Once | ORAL | Status: AC
  Administered 2016-01-11: 100 mg via ORAL
  Filled 2016-01-11: qty 1

## 2016-01-11 NOTE — Progress Notes (Signed)
PROGRESS NOTE                                                                                                                                                                                                             Patient Demographics:    [name], is a 65 y.o. female, DOB - [birth year], ZRA:[id number]  Admit date - 01/09/2016   Admitting Physician Reubin Milan, MD  Outpatient Primary MD for the patient is [name], MD  LOS - 2  Chief Complaint  Patient presents with  . Dizziness       Brief Narrative    [name] is a 65 y.o. female with medical history significant of multiple myeloma on relapse, hyperlipidemia, pathologic compression fracture, hypothyroidism, GERD, hypertension, multiple pneumonia episodes who comes to the emergency department after her oncologist recommended her to seek immediate medical attention due to progressively worse weakness and dizziness.  Per patient, she has been feeling weak for several days. She was seen here a few days ago due to weakness and was found to be hypokalemic with a potassium level of 2.7 mmol per liter. Since then, the patient states that she has continued to have dizziness and weakness, occasional frontal headache, dyspnea with pleuritic chest pain and occasionally productive cough, subjective low-grade fevers and chills. She states that several weeks ago she was treated for cough with prednisone and Levaquin. She denies sick contacts to her knowledge.  She complains of frequent nausea, but no recent vomiting. She gets episodes of diarrhea alternating with occasional constipation. She denies GU symptoms.  ED Course: Workup reveals multilobar pneumonia on CT scan angiogram of the chest. The patient was started on HCAP antibiotic coverage.    Subjective:    Burlene Montecalvo today has, No headache, No chest pain, No abdominal pain - No  Nausea, No new weakness tingling or numbness, Improved cough and SOB, but still requiring oxygen upon ambulation.    Assessment  & Plan :     1.Pneumonia with acute hypoxic respiratory failure. In a immunocompromised patient who is on active chemotherapy for multiple myeloma, Started on broad-spectrum IV antibiotics which will be continued for another 24 hours, she still has exertional shortness of breath and still requiring oxygen, clinically no signs of sepsis, follow cultures along with urine legionella and strep pneumo antigen. Continue supportive  care for now.  2. History of multiple myeloma. Resume chemotherapy after discharge and follow with Dr. Julien Nordmann.  3. Hypothyroidism. On Synthroid.  4. GERD. On PPI.  5. Chronic grade 1 diastolic dysfunction last EF 55-60% on recent echocardiogram. Compensated. Will resume home dose Lasix.  6. Mildly elevated d-dimer due to pneumonia. Negative CT angiogram chest and lower extremity venous duplex.   Family Communication  :  daughter  Code Status : Full  Diet : Heart Healthy  Disposition Plan  :  Home in 1-2 days  Consults  :  None  Procedures  :     CT angiogram chest. Pneumonia  CT head nonacute  Lower extremity venous duplex -  Preliminary findings: No evidence of DVT or baker's cyst.  DVT Prophylaxis  :  Lovenox    Lab Results  Component Value Date   PLT 213 01/11/2016    Inpatient Medications  Scheduled Meds: . aspirin EC  81 mg Oral Q M,W,F  . ceFEPime (MAXIPIME) IV  1 g Intravenous Q8H  . citalopram  40 mg Oral Daily  . enoxaparin (LOVENOX) injection  40 mg Subcutaneous Q24H  . levothyroxine  100 mcg Oral QAC breakfast  . magnesium gluconate  500 mg Oral BID  . montelukast  10 mg Oral Daily  . morphine  15 mg Oral Q12H  . multivitamin with minerals  1 tablet Oral q morning - 10a  . pantoprazole  40 mg Oral Daily  . potassium chloride  10 mEq Oral BID  . vancomycin  1,250 mg Intravenous Q12H   Continuous  Infusions:  PRN Meds:.albuterol, ALPRAZolam, cyclobenzaprine, gabapentin, HYDROcodone-acetaminophen, ipratropium, loperamide, nystatin, ondansetron **OR** ondansetron (ZOFRAN) IV, sodium chloride flush, zolpidem  Antibiotics  :    Anti-infectives    Start     Dose/Rate Route Frequency Ordered Stop   01/10/16 0800  vancomycin (VANCOCIN) 1,250 mg in sodium chloride 0.9 % 250 mL IVPB     1,250 mg 166.7 mL/hr over 90 Minutes Intravenous Every 12 hours 01/09/16 2140     01/10/16 0600  ceFEPIme (MAXIPIME) 1 g in dextrose 5 % 50 mL IVPB     1 g 100 mL/hr over 30 Minutes Intravenous Every 8 hours 01/10/16 0128 01/18/16 0559   01/09/16 2200  ceFEPIme (MAXIPIME) 1 g in dextrose 5 % 50 mL IVPB     1 g 100 mL/hr over 30 Minutes Intravenous  Once 01/09/16 2129 01/09/16 2235   01/09/16 2200  vancomycin (VANCOCIN) IVPB 1000 mg/200 mL premix     1,000 mg 200 mL/hr over 60 Minutes Intravenous NOW 01/09/16 2140 01/10/16 0010         Objective:   Filed Vitals:   01/10/16 0535 01/10/16 1520 01/10/16 2012 01/11/16 0537  BP: 117/53 122/53 113/62 117/57  Pulse: 72 71 83 80  Temp: 97.6 F (36.4 C) 97.9 F (36.6 C) 98.7 F (37.1 C) 98.4 F (36.9 C)  TempSrc: Oral Oral Oral Oral  Resp: 18 17 18 16   Height:      Weight:      SpO2: 100% 100% 100% 98%    Wt Readings from Last 3 Encounters:  01/09/16 98.431 kg (217 lb)  12/28/15 95.573 kg (210 lb 11.2 oz)  12/08/15 94.439 kg (208 lb 3.2 oz)     Intake/Output Summary (Last 24 hours) at 01/11/16 1022 Last data filed at 01/11/16 0630  Gross per 24 hour  Intake    640 ml  Output      0 ml  Net    640 ml     Physical Exam  Awake Alert, Oriented X 3, No new F.N deficits, Normal affect [place].AT,PERRAL Supple Neck,No JVD, No cervical lymphadenopathy appriciated.  Symmetrical Chest wall movement, Good air movement bilaterally, few rales RRR,No Gallops,Rubs or new Murmurs, No Parasternal Heave +ve B.Sounds, Abd Soft, No tenderness, No  organomegaly appriciated, No rebound - guarding or rigidity. No Cyanosis, Clubbing or edema, No new Rash or bruise       Data Review:    CBC  Recent Labs Lab 01/04/16 1830 01/09/16 1630 01/10/16 0550 01/11/16 0435  WBC 3.1* 2.7* 2.3* 3.0*  HGB 9.8* 10.3* 8.8* 8.7*  HCT 29.4* 32.9* 27.0* 27.2*  PLT 228 250 236 213  MCV 102.8* 106.8* 106.3* 105.8*  MCH 34.3* 33.4 34.6* 33.9  MCHC 33.3 31.3 32.6 32.0  RDW 15.6* 16.5* 16.7* 16.3*  LYMPHSABS  --  1.0 0.8 1.0  MONOABS  --  0.2 0.1 0.2  EOSABS  --  0.1 0.1 0.1  BASOSABS  --  0.0 0.0 0.0    Chemistries   Recent Labs Lab 01/04/16 1830 01/09/16 1630 01/10/16 0550 01/11/16 0435  NA 139 138 137 137  K 2.7* 3.6 3.7 3.9  CL 106 102 105 108  CO2 26 29 27 26   GLUCOSE 92 107* 130* 122*  BUN 8 10 7 9   CREATININE 0.76 0.77 0.78 0.79  CALCIUM 8.0* 8.8* 7.7* 8.0*  MG 1.3* 1.6*  --   --   AST 18 22 22 20   ALT 16 19 13* 15  ALKPHOS 58 78 61 77  BILITOT 1.1 0.6 0.5 0.3   ------------------------------------------------------------------------------------------------------------------ No results for input(s): CHOL, HDL, LDLCALC, TRIG, CHOLHDL, LDLDIRECT in the last 72 hours.  Lab Results  Component Value Date   HGBA1C 6.0* 10/03/2015   ------------------------------------------------------------------------------------------------------------------ No results for input(s): TSH, T4TOTAL, T3FREE, THYROIDAB in the last 72 hours.  Invalid input(s): FREET3 ------------------------------------------------------------------------------------------------------------------ No results for input(s): VITAMINB12, FOLATE, FERRITIN, TIBC, IRON, RETICCTPCT in the last 72 hours.  Coagulation profile No results for input(s): INR, PROTIME in the last 168 hours.   Recent Labs  01/09/16 1630  DDIMER 1.45*    Cardiac Enzymes No results for input(s): CKMB, TROPONINI, MYOGLOBIN in the last 168 hours.  Invalid input(s):  CK ------------------------------------------------------------------------------------------------------------------    Component Value Date/Time   BNP 45.8 01/09/2016 1630   BNP 33.0 09/09/2014 1130    Micro Results Recent Results (from the past 240 hour(s))  Culture, blood (routine x 2)     Status: None (Preliminary result)   Collection Time: 01/09/16  9:47 PM  Result Value Ref Range Status   Specimen Description BLOOD RIGHT PORTA CATH  Final   Special Requests BOTTLES DRAWN AEROBIC AND ANAEROBIC 5CC  Final   Culture   Final    NO GROWTH < 24 HOURS Performed at [facility]    Report Status PENDING  Incomplete  Culture, blood (routine x 2)     Status: None (Preliminary result)   Collection Time: 01/09/16 10:16 PM  Result Value Ref Range Status   Specimen Description BLOOD LEFT ANTECUBITAL  Final   Special Requests BOTTLES DRAWN AEROBIC AND ANAEROBIC 5CC  Final   Culture   Final    NO GROWTH < 24 HOURS Performed at [facility]    Report Status PENDING  Incomplete    Radiology Reports Dg Chest 2 View  01/09/2016  CLINICAL DATA:  Dizziness EXAM: CHEST  2 VIEW COMPARISON:  10/31/2015 FINDINGS:  Cardiac shadow is within normal limits. A right chest wall port is again seen. The lungs are well aerated bilaterally. Mild increased density is noted in the lingula consistent with early infiltrate. No other focal abnormality is noted. IMPRESSION: Early lingular infiltrate. Electronically Signed   By: Inez Catalina M.D.   On: 01/09/2016 19:42   Ct Head Wo Contrast  01/09/2016  CLINICAL DATA:  65 year old female with history of multiple myeloma presenting with weakness and dizziness. History of brain tumor with radiation treatment. EXAM: CT HEAD WITHOUT CONTRAST TECHNIQUE: Contiguous axial images were obtained from the base of the skull through the vertex without intravenous contrast. COMPARISON:  Head CT dated 08/09/2013 and MRI dated 01/04/2016 FINDINGS: The ventricles and  the sulci are appropriate in size for the patient's age. There is no intracranial hemorrhage. No midline shift or mass effect identified. The gray-white matter differentiation is preserved. Multiple bilateral calvarial lytic lesion with the largest in the right parietal calvarium similar to prior study and compatible with known multiple myeloma. No acute calvarial fracture. IMPRESSION: No acute intracranial pathology. Calvarial lytic lesions compatible with known multiple myeloma. These lesions are stable since 08/09/2013. Electronically Signed   By: Anner Crete M.D.   On: 01/09/2016 20:03   Ct Angio Chest Pe W/cm &/or Wo Cm  01/09/2016  CLINICAL DATA:  Shortness of breath, history of multiple myeloma EXAM: CT ANGIOGRAPHY CHEST WITH CONTRAST TECHNIQUE: Multidetector CT imaging of the chest was performed using the standard protocol during bolus administration of intravenous contrast. Multiplanar CT image reconstructions and MIPs were obtained to evaluate the vascular anatomy. CONTRAST:  100 mL Isovue 370 COMPARISON:  None. FINDINGS: Mediastinum/Lymph Nodes: No pulmonary emboli or thoracic aortic dissection identified. No masses or pathologically enlarged lymph nodes identified. Lungs/Pleura: There is lingular airspace disease. There is mild patchy right middle lobe airspace disease. There is a small area of consolidation in the posterior left lower lobe. Upper abdomen: No acute findings. Musculoskeletal: No chest wall mass or suspicious bone lesions identified. Review of the MIP images confirms the above findings. IMPRESSION: 1. No evidence of pulmonary embolus. 2. Lingular airspace disease. Mild patchy right middle lobe airspace disease. Small area of consolidation in the posterior left lower lobe. Overall findings are concerning for multilobar numb Electronically Signed   By: Kathreen Devoid   On: 01/09/2016 21:13   Mr Brain W Wo Contrast  01/04/2016  CLINICAL DATA:  Multiple myeloma.  Skull lesion.   Confusion. EXAM: MRI HEAD WITHOUT AND WITH CONTRAST TECHNIQUE: Multiplanar, multiecho pulse sequences of the brain and surrounding structures were obtained without and with intravenous contrast. CONTRAST:  46m MULTIHANCE GADOBENATE DIMEGLUMINE 529 MG/ML IV SOLN COMPARISON:  08/24/2015. FINDINGS: No evidence for acute infarction, hemorrhage, mass lesion, hydrocephalus, or extra-axial fluid. Normal cerebral volume. Mild subcortical and periventricular T2 and FLAIR hyperintensities, likely chronic microvascular ischemic change. Pituitary, pineal, and cerebellar tonsils unremarkable. No upper cervical lesions. Post infusion, abnormal pachymeningeal enhancement is redemonstrated, greatest associated with the RIGHT parietal lesion representing metastatic melanoma. The lesion is essentially unchanged in size, measuring 10 x 34 x 35 mm (R-L x A-P x C-C). Smaller focal calvarial lesions are seen in the LEFT parietal bone, and in the midline posteriorly. Overall, no significant change from priors. No midline shift or significant adjacent vasogenic edema. No sinus or mastoid disease. Negative orbits. Clival heterogeneity and upper cervical heterogeneity along with a permeative appearance to the diploic space, representing diffuse osseous disease. IMPRESSION: Stable findings of multiple myeloma. Mild RIGHT  parietal pachymeningeal enhancement adjacent to the largest deposit, but no intra-axial edema or parenchymal lesions. No acute intracranial abnormality. Electronically Signed   By: Staci Righter M.D.   On: 01/04/2016 08:35    Time Spent in minutes  30   Navdeep Fessenden K M.D on 01/11/2016 at 10:22 AM  Between 7am to 7pm - Pager - [phone]  After 7pm go to [email or web address] - password [facility]  [facility] -  Office  [phone]

## 2016-01-11 NOTE — Progress Notes (Signed)
Patient complained to RN of burning when urinating and vaginal itching.  Patient stated "this happens whenever I'm on IV antibiotics."  MD paged.  Will continue to monitor.

## 2016-01-12 ENCOUNTER — Telehealth: Payer: Self-pay | Admitting: Medical Oncology

## 2016-01-12 ENCOUNTER — Telehealth: Payer: Self-pay

## 2016-01-12 LAB — COMPREHENSIVE METABOLIC PANEL
ALT: 15 U/L (ref 14–54)
AST: 17 U/L (ref 15–41)
Albumin: 3 g/dL — ABNORMAL LOW (ref 3.5–5.0)
Alkaline Phosphatase: 65 U/L (ref 38–126)
Anion gap: 4 — ABNORMAL LOW (ref 5–15)
BILIRUBIN TOTAL: 0.9 mg/dL (ref 0.3–1.2)
BUN: 9 mg/dL (ref 6–20)
CO2: 23 mmol/L (ref 22–32)
CREATININE: 0.71 mg/dL (ref 0.44–1.00)
Calcium: 8.2 mg/dL — ABNORMAL LOW (ref 8.9–10.3)
Chloride: 108 mmol/L (ref 101–111)
Glucose, Bld: 151 mg/dL — ABNORMAL HIGH (ref 65–99)
POTASSIUM: 4.6 mmol/L (ref 3.5–5.1)
Sodium: 135 mmol/L (ref 135–145)
TOTAL PROTEIN: 6.3 g/dL — AB (ref 6.5–8.1)

## 2016-01-12 LAB — CBC WITH DIFFERENTIAL/PLATELET
BASOS ABS: 0 10*3/uL (ref 0.0–0.1)
Basophils Relative: 0 %
EOS PCT: 0 %
Eosinophils Absolute: 0 10*3/uL (ref 0.0–0.7)
HEMATOCRIT: 28.4 % — AB (ref 36.0–46.0)
Hemoglobin: 9.1 g/dL — ABNORMAL LOW (ref 12.0–15.0)
LYMPHS ABS: 1.2 10*3/uL (ref 0.7–4.0)
LYMPHS PCT: 22 %
MCH: 33.6 pg (ref 26.0–34.0)
MCHC: 32 g/dL (ref 30.0–36.0)
MCV: 104.8 fL — AB (ref 78.0–100.0)
MONO ABS: 0.2 10*3/uL (ref 0.1–1.0)
MONOS PCT: 3 %
NEUTROS ABS: 4 10*3/uL (ref 1.7–7.7)
Neutrophils Relative %: 75 %
PLATELETS: 234 10*3/uL (ref 150–400)
RBC: 2.71 MIL/uL — ABNORMAL LOW (ref 3.87–5.11)
RDW: 15.4 % (ref 11.5–15.5)
WBC: 5.4 10*3/uL (ref 4.0–10.5)

## 2016-01-12 LAB — URINE CULTURE: Culture: NO GROWTH

## 2016-01-12 MED ORDER — HEPARIN SOD (PORK) LOCK FLUSH 100 UNIT/ML IV SOLN
500.0000 [IU] | INTRAVENOUS | Status: AC | PRN
  Administered 2016-01-12: 500 [IU]

## 2016-01-12 MED ORDER — LEVOFLOXACIN 750 MG PO TABS: 750.0000 mg | ORAL_TABLET | Freq: Every day | ORAL | Status: DC

## 2016-01-12 NOTE — Care Management Note (Signed)
Case Management Note  Patient Details  Name: [name] MRN: [id number] Date of Birth: 10-02-1950  Subjective/Objective:                    Action/Plan:d/c home no needs or orders.   Expected Discharge Date:                  Expected Discharge Plan:  Home/Self Care  In-House Referral:     Discharge planning Services  CM Consult  Post Acute Care Choice:    Choice offered to:     DME Arranged:    DME Agency:     HH Arranged:    [place] Agency:     Status of Service:  Completed, signed off  If discussed at [employer] of Stay Meetings, dates discussed:    Additional Comments:  Dessa Phi, RN 01/12/2016, 10:16 AM

## 2016-01-12 NOTE — Telephone Encounter (Signed)
-----   Message from Ardeen Garland, RN sent at 01/12/2016  3:00 PM EDT ----- Regarding: FW: pomalyst held during hospitalization Can you call her  ----- Message -----    From: Curt Bears, MD    Sent: 01/12/2016  11:52 AM      To: Ardeen Garland, RN Subject: RE: pomalyst held during hospitalization       She needs a week off to recover from the pneumonia after leaving the hospital before restarting it. ----- Message -----    From: Ardeen Garland, RN    Sent: 01/12/2016  10:25 AM      To: Curt Bears, MD Subject: pomalyst held during hospitalization           Next ov July 31. When do you want her to restart pomalyst.

## 2016-01-12 NOTE — Discharge Instructions (Signed)
Follow with Primary MD MCKEOWN,WILLIAM DAVID, MD in 3 days   Get CBC, CMP, 2 view Chest X ray checked  by Primary MD or SNF MD in 5-7 days ( we routinely change or add medications that can affect your baseline labs and fluid status, therefore we recommend that you get the mentioned basic workup next visit with your PCP, your PCP may decide not to get them or add new tests based on their clinical decision)   Activity: As tolerated with Full fall precautions use walker/cane & assistance as needed   Disposition Home     Diet:   Heart Healthy  .  For Heart failure patients - Check your Weight same time everyday, if you gain over 2 pounds, or you develop in leg swelling, experience more shortness of breath or chest pain, call your Primary MD immediately. Follow Cardiac Low Salt Diet and 1.5 lit/day fluid restriction.   On your next visit with your primary care physician please Get Medicines reviewed and adjusted.   Please request your Prim.MD to go over all Hospital Tests and Procedure/Radiological results at the follow up, please get all Hospital records sent to your Prim MD by signing hospital release before you go home.   If you experience worsening of your admission symptoms, develop shortness of breath, life threatening emergency, suicidal or homicidal thoughts you must seek medical attention immediately by calling 911 or calling your MD immediately  if symptoms less severe.  You Must read complete instructions/literature along with all the possible adverse reactions/side effects for all the Medicines you take and that have been prescribed to you. Take any new Medicines after you have completely understood and accpet all the possible adverse reactions/side effects.   Do not drive, operate heavy machinery, perform activities at heights, swimming or participation in water activities or provide baby sitting services if your were admitted for syncope or siezures until you have seen by Primary  MD or a Neurologist and advised to do so again.  Do not drive when taking Pain medications.    Do not take more than prescribed Pain, Sleep and Anxiety Medications  Special Instructions: If you have smoked or chewed Tobacco  in the last 2 yrs please stop smoking, stop any regular Alcohol  and or any Recreational drug use.  Wear Seat belts while driving.   Please note  You were cared for by a hospitalist during your hospital stay. If you have any questions about your discharge medications or the care you received while you were in the hospital after you are discharged, you can call the unit and asked to speak with the hospitalist on call if the hospitalist that took care of you is not available. Once you are discharged, your primary care physician will handle any further medical issues. Please note that NO REFILLS for any discharge medications will be authorized once you are discharged, as it is imperative that you return to your primary care physician (or establish a relationship with a primary care physician if you do not have one) for your aftercare needs so that they can reassess your need for medications and monitor your lab values.

## 2016-01-12 NOTE — Telephone Encounter (Signed)
In hodpital -soon to be discahrged. Asking when to restart pomalyst/ note to [facility]

## 2016-01-12 NOTE — Care Management Important Message (Signed)
Important Message  Patient Details  Name: [name] MRN: [id number] Date of Birth: [birth year]   Medicare Important Message Given:  Yes    [name] 01/12/2016, 9:13 AMImportant Message  Patient Details  Name: [name] MRN: [id number] Date of Birth: [birth year]   Medicare Important Message Given:  Yes    [name] 01/12/2016, 9:13 AM

## 2016-01-12 NOTE — Progress Notes (Signed)
Pharmacy Antibiotic Note  [name] is a 65 y.o. female admitted on 01/09/2016 with pneumonia.  Pharmacy has been consulted for vancomycin dosing. Cefepime ordered by EDP x 1 dose.  She has MM and receiving pomalidomide. CTA negative for PE but likely multilobar pneumonia.    Plan:  Vancomcyin & Cefepime discontinued, antibioitics narrowed today 7/14  Levaquin '750mg'$  po daily  Continue to hold PTA Pomalidomide  Height: '5\' 6"'$  (167.6 cm) Weight: 217 lb (98.431 kg) IBW/kg (Calculated) : 59.3  Temp (24hrs), Avg:97.9 F (36.6 C), Min:97.9 F (36.6 C), Max:97.9 F (36.6 C)   Recent Labs Lab 01/09/16 1630 01/09/16 2225 01/10/16 0550 01/11/16 0435 01/12/16 0428  WBC 2.7*  --  2.3* 3.0* 5.4  CREATININE 0.77  --  0.78 0.79 0.71  LATICACIDVEN  --  1.86  --   --   --     Estimated Creatinine Clearance: 84 mL/min (by C-G formula based on Cr of 0.71).    No Known Allergies  Antimicrobials this admission: 7/11 >> vancomycin x 8 days>> 7/14 7/11 >> cefepime >> 7/14 7/14 Levaquin po >>  Microbiology results: 7/11 BCx: NGTD 7/12 Strep pneumo: neg 7/12 Legionella: neg 7/13 UCx: sent  Thank you for allowing pharmacy to be a part of this patient's care  Minda Ditto PharmD Pager [phone] 01/12/2016, 11:28 AM

## 2016-01-12 NOTE — Telephone Encounter (Signed)
Called and spoke with patient states she got out of the hospital today. Informed her Dr. Julien Nordmann would like her to hold the pomalyst for one week. To restart on 7/22. Pt verbalized understanding and denies any questions or concerns at this time.

## 2016-01-12 NOTE — Discharge Summary (Signed)
[name] YQI:[id number] DOB: [birth year] DOA: 01/09/2016  PCP: [name], MD  Admit date: 01/09/2016  Discharge date: 01/12/2016  Admitted From: Home   Disposition:  Home   Recommendations for Outpatient Follow-up:   Follow up with PCP in 1-2 weeks  PCP Please obtain BMP/CBC, 2 view CXR in 1week,  (see Discharge instructions)   PCP Please follow up on the following pending results: None   Home Health: None   Equipment/Devices: None  Discharge Condition: Fair    CODE STATUS: Full   Diet Recommendation: Heart healthy Consultations: None  Chief Complaint  Patient presents with  . Dizziness     Brief history of present illness from the day of admission and additional interim summary    [name] is a 65 y.o. female with medical history significant of multiple myeloma on relapse, hyperlipidemia, pathologic compression fracture, hypothyroidism, GERD, hypertension, multiple pneumonia episodes who comes to the emergency department after her oncologist recommended her to seek immediate medical attention due to progressively worse weakness and dizziness.  Per patient, she has been feeling weak for several days. She was seen here a few days ago due to weakness and was found to be hypokalemic with a potassium level of 2.7 mmol per liter. Since then, the patient states that she has continued to have dizziness and weakness, occasional frontal headache, dyspnea with pleuritic chest pain and occasionally productive cough, subjective low-grade fevers and chills. She states that several weeks ago she was treated for cough with prednisone and Levaquin. She denies sick contacts to her knowledge.  She complains of frequent nausea, but no recent vomiting. She gets episodes of diarrhea alternating with  occasional constipation. She denies GU symptoms.  ED Course: Workup reveals multilobar pneumonia on CT scan angiogram of the chest. The patient was started on HCAP antibiotic coverage.  Hospital issues addressed    1.Pneumonia with acute hypoxic respiratory failure. In a immunocompromised patient who is on active chemotherapy for multiple myeloma - She improved and is now at her baseline and almost completely symptom-free after empiric broad-spectrum IV antibiotics her cultures are negative, she was not septic, she currently has no shortness of breath and no oxygen demand, will be transitioned to 4 more days of oral Levaquin, we will have her follow with PCP in 3-4 days for repeat CBC and a 2 view chest x-ray.  2. History of multiple myeloma. Resume chemotherapy after discharge and follow with Dr. Julien Nordmann.  3. Hypothyroidism. On Synthroid.  4. GERD. On PPI.  5. Chronic grade 1 diastolic dysfunction last EF 55-60% on recent echocardiogram. Compensated. Resume home dose Lasix.  6. Mildly elevated d-dimer due to pneumonia. Negative CT angiogram chest and lower extremity venous duplex.   Discharge diagnosis     Principal Problem:   HCAP (healthcare-associated pneumonia) Active Problems:   Multiple myeloma in relapse [facility])   Hypothyroidism   GERD   Essential hypertension   Hyperlipidemia   Hypomagnesemia    Discharge instructions    Discharge Instructions  Diet - low sodium heart healthy    Complete by:  As directed      Discharge instructions    Complete by:  As directed   Follow with Primary MD MCKEOWN,WILLIAM DAVID, MD in 7 days   Get CBC, CMP, 2 view Chest X ray checked  by Primary MD or SNF MD in 5-7 days ( we routinely change or add medications that can affect your baseline labs and fluid status, therefore we recommend that you get the mentioned basic workup next visit with your PCP, your PCP may decide not to get them or add new tests based on their clinical  decision)   Activity: As tolerated with Full fall precautions use walker/cane & assistance as needed   Disposition Home     Diet:   Heart Healthy  .  For Heart failure patients - Check your Weight same time everyday, if you gain over 2 pounds, or you develop in leg swelling, experience more shortness of breath or chest pain, call your Primary MD immediately. Follow Cardiac Low Salt Diet and 1.5 lit/day fluid restriction.   On your next visit with your primary care physician please Get Medicines reviewed and adjusted.   Please request your Prim.MD to go over all Hospital Tests and Procedure/Radiological results at the follow up, please get all Hospital records sent to your Prim MD by signing hospital release before you go home.   If you experience worsening of your admission symptoms, develop shortness of breath, life threatening emergency, suicidal or homicidal thoughts you must seek medical attention immediately by calling 911 or calling your MD immediately  if symptoms less severe.  You Must read complete instructions/literature along with all the possible adverse reactions/side effects for all the Medicines you take and that have been prescribed to you. Take any new Medicines after you have completely understood and accpet all the possible adverse reactions/side effects.   Do not drive, operate heavy machinery, perform activities at heights, swimming or participation in water activities or provide baby sitting services if your were admitted for syncope or siezures until you have seen by Primary MD or a Neurologist and advised to do so again.  Do not drive when taking Pain medications.    Do not take more than prescribed Pain, Sleep and Anxiety Medications  Special Instructions: If you have smoked or chewed Tobacco  in the last 2 yrs please stop smoking, stop any regular Alcohol  and or any Recreational drug use.  Wear Seat belts while driving.   Please note  You were cared  for by a hospitalist during your hospital stay. If you have any questions about your discharge medications or the care you received while you were in the hospital after you are discharged, you can call the unit and asked to speak with the hospitalist on call if the hospitalist that took care of you is not available. Once you are discharged, your primary care physician will handle any further medical issues. Please note that NO REFILLS for any discharge medications will be authorized once you are discharged, as it is imperative that you return to your primary care physician (or establish a relationship with a primary care physician if you do not have one) for your aftercare needs so that they can reassess your need for medications and monitor your lab values.     Increase activity slowly    Complete by:  As directed            Discharge Medications  Medication List    TAKE these medications        ALPRAZolam 0.5 MG tablet  Commonly known as:  XANAX  TAKE 1/2 TO 1 TABLET 2 TO 3 TIMES DAILY AS NEEDED FOR ANXIETY     aspirin 81 MG tablet  Take 81 mg by mouth as directed. Mondays, Wednesdays, and Fridays     benzonatate 200 MG capsule  Commonly known as:  TESSALON  Take 1 perle 3 x/day to prevent cough     citalopram 40 MG tablet  Commonly known as:  CELEXA  TAKE 1 TABLET (40 MG TOTAL) BY MOUTH DAILY.     cyclobenzaprine 10 MG tablet  Commonly known as:  FLEXERIL  Take 1 tablet (10 mg total) by mouth 3 (three) times daily as needed for muscle spasms.     dexamethasone 4 MG tablet  Commonly known as:  DECADRON  TAKE (10) TABLETS BY MOUTH EVERY FRIDAY     diphenoxylate-atropine 2.5-0.025 MG tablet  Commonly known as:  LOMOTIL  TAKE 2 TABLETS BY MOUTH 4 TIMES A DAY AS NEEDED FOR DIARRHEA     Eszopiclone 3 MG Tabs  TAKE 1 TABLET BY MOUTH AT BEDTIME AS NEEDED FOR SLEEP     furosemide 40 MG tablet  Commonly known as:  LASIX  TAKE 1 TABLET BY MOUTH TWICE A DAY FOR FLUID AND  SWELLING     gabapentin 600 MG tablet  Commonly known as:  NEURONTIN  TAKE 1 TABLET 4 X DAILY AS NEEDED FOR PAIN OR CRAMPS     HYDROcodone-acetaminophen 7.5-325 MG tablet  Commonly known as:  NORCO  Take 1 tablet by mouth every 6 (six) hours as needed for moderate pain.     KLOR-CON 10 10 MEQ tablet  Generic drug:  potassium chloride  TAKE 1 TABLET BY MOUTH TWICE A DAY     levofloxacin 750 MG tablet  Commonly known as:  LEVAQUIN  Take 1 tablet (750 mg total) by mouth daily.     loperamide 2 MG capsule  Commonly known as:  IMODIUM  Take 2 mg by mouth as needed for diarrhea or loose stools. Reported on 10/02/2015     Magnesium 500 MG Tabs  Take 500 mg by mouth 2 (two) times daily.     mometasone 50 MCG/ACT nasal spray  Commonly known as:  NASONEX  PLACE 2 SPRAYS INTO THE NOSE DAILY.     montelukast 10 MG tablet  Commonly known as:  SINGULAIR  TAKE 1 TABLET BY MOUTH EVERY DAY     morphine 15 MG 12 hr tablet  Commonly known as:  MS CONTIN  Take 1 tablet (15 mg total) by mouth every 12 (twelve) hours.     multivitamin with minerals Tabs tablet  Take 1 tablet by mouth every morning.     nystatin 100000 UNIT/ML suspension  Commonly known as:  MYCOSTATIN  Gargle 5 mL PO q6hours and spit out after 20-30 seconds.  Use for 1 week     ondansetron 8 MG disintegrating tablet  Commonly known as:  ZOFRAN-ODT  TAKE 1 TABLET BY MOUTH EVERY 8 HOURS AS NEEDED FOR NAUSEA AND VOMITING     pantoprazole 40 MG tablet  Commonly known as:  PROTONIX  TAKE 1 TABLET (40 MG TOTAL) BY MOUTH 2 (TWO) TIMES DAILY.     pomalidomide 2 MG capsule  Commonly known as:  POMALYST  Take 1 capsule (2 mg total) by mouth daily. Take with water on days 1-21. Repeat  every 28 days.     predniSONE 20 MG tablet  Commonly known as:  DELTASONE  1 tab 3 x day for 3 days, then 1 tab 2 x day for 3 days, then 1 tab 1 x day for 5 days     SYNTHROID 100 MCG tablet  Generic drug:  levothyroxine  Take  Tablet daily  on an empty stomach for 30 minutes        No Known Allergies      Follow-up Information    Follow up with MCKEOWN,WILLIAM DAVID, MD. Schedule an appointment as soon as possible for a visit in 3 days.   Specialty:  Internal Medicine   Contact information:   [address] [place] [address] [phone]       Follow up with Eilleen Kempf., MD. Schedule an appointment as soon as possible for a visit in 1 week.   Specialty:  Oncology   Contact information:   [name] [place] [address] [phone]       Major procedures and Radiology Reports - PLEASE review detailed and final reports thoroughly  -     CT angiogram chest. Pneumonia  CT head nonacute  Lower extremity venous duplex -  Preliminary findings: No evidence of DVT or baker's cyst.     Dg Chest 2 View  01/09/2016  CLINICAL DATA:  Dizziness EXAM: CHEST  2 VIEW COMPARISON:  10/31/2015 FINDINGS: Cardiac shadow is within normal limits. A right chest wall port is again seen. The lungs are well aerated bilaterally. Mild increased density is noted in the lingula consistent with early infiltrate. No other focal abnormality is noted. IMPRESSION: Early lingular infiltrate. Electronically Signed   By: Inez Catalina M.D.   On: 01/09/2016 19:42   Ct Head Wo Contrast  01/09/2016  CLINICAL DATA:  65 year old female with history of multiple myeloma presenting with weakness and dizziness. History of brain tumor with radiation treatment. EXAM: CT HEAD WITHOUT CONTRAST TECHNIQUE: Contiguous axial images were obtained from the base of the skull through the vertex without intravenous contrast. COMPARISON:  Head CT dated 08/09/2013 and MRI dated 01/04/2016 FINDINGS: The ventricles and the sulci are appropriate in size for the patient's age. There is no intracranial hemorrhage. No midline shift or mass effect identified. The gray-white matter differentiation is preserved. Multiple bilateral calvarial lytic  lesion with the largest in the right parietal calvarium similar to prior study and compatible with known multiple myeloma. No acute calvarial fracture. IMPRESSION: No acute intracranial pathology. Calvarial lytic lesions compatible with known multiple myeloma. These lesions are stable since 08/09/2013. Electronically Signed   By: Anner Crete M.D.   On: 01/09/2016 20:03   Ct Angio Chest Pe W/cm &/or Wo Cm  01/09/2016  CLINICAL DATA:  Shortness of breath, history of multiple myeloma EXAM: CT ANGIOGRAPHY CHEST WITH CONTRAST TECHNIQUE: Multidetector CT imaging of the chest was performed using the standard protocol during bolus administration of intravenous contrast. Multiplanar CT image reconstructions and MIPs were obtained to evaluate the vascular anatomy. CONTRAST:  100 mL Isovue 370 COMPARISON:  None. FINDINGS: Mediastinum/Lymph Nodes: No pulmonary emboli or thoracic aortic dissection identified. No masses or pathologically enlarged lymph nodes identified. Lungs/Pleura: There is lingular airspace disease. There is mild patchy right middle lobe airspace disease. There is a small area of consolidation in the posterior left lower lobe. Upper abdomen: No acute findings. Musculoskeletal: No chest wall mass or suspicious bone lesions identified. Review of the MIP images confirms the above  findings. IMPRESSION: 1. No evidence of pulmonary embolus. 2. Lingular airspace disease. Mild patchy right middle lobe airspace disease. Small area of consolidation in the posterior left lower lobe. Overall findings are concerning for multilobar numb Electronically Signed   By: Kathreen Devoid   On: 01/09/2016 21:13   Mr Brain W Wo Contrast  01/04/2016  CLINICAL DATA:  Multiple myeloma.  Skull lesion.  Confusion. EXAM: MRI HEAD WITHOUT AND WITH CONTRAST TECHNIQUE: Multiplanar, multiecho pulse sequences of the brain and surrounding structures were obtained without and with intravenous contrast. CONTRAST:  12m MULTIHANCE GADOBENATE  DIMEGLUMINE 529 MG/ML IV SOLN COMPARISON:  08/24/2015. FINDINGS: No evidence for acute infarction, hemorrhage, mass lesion, hydrocephalus, or extra-axial fluid. Normal cerebral volume. Mild subcortical and periventricular T2 and FLAIR hyperintensities, likely chronic microvascular ischemic change. Pituitary, pineal, and cerebellar tonsils unremarkable. No upper cervical lesions. Post infusion, abnormal pachymeningeal enhancement is redemonstrated, greatest associated with the RIGHT parietal lesion representing metastatic melanoma. The lesion is essentially unchanged in size, measuring 10 x 34 x 35 mm (R-L x A-P x C-C). Smaller focal calvarial lesions are seen in the LEFT parietal bone, and in the midline posteriorly. Overall, no significant change from priors. No midline shift or significant adjacent vasogenic edema. No sinus or mastoid disease. Negative orbits. Clival heterogeneity and upper cervical heterogeneity along with a permeative appearance to the diploic space, representing diffuse osseous disease. IMPRESSION: Stable findings of multiple myeloma. Mild RIGHT parietal pachymeningeal enhancement adjacent to the largest deposit, but no intra-axial edema or parenchymal lesions. No acute intracranial abnormality. Electronically Signed   By: JStaci RighterM.D.   On: 01/04/2016 08:35    Micro Results      Recent Results (from the past 240 hour(s))  Culture, blood (routine x 2)     Status: None (Preliminary result)   Collection Time: 01/09/16  9:47 PM  Result Value Ref Range Status   Specimen Description BLOOD RIGHT PORTA CATH  Final   Special Requests BOTTLES DRAWN AEROBIC AND ANAEROBIC 5CC  Final   Culture   Final    NO GROWTH 2 DAYS [facility]   Report Status PENDING  Incomplete  Culture, blood (routine x 2)     Status: None (Preliminary result)   Collection Time: 01/09/16 10:16 PM  Result Value Ref Range Status   Specimen Description BLOOD LEFT ANTECUBITAL  Final    Special Requests BOTTLES DRAWN AEROBIC AND ANAEROBIC 5CC  Final   Culture   Final    NO GROWTH 2 DAYS [facility]   Report Status PENDING  Incomplete    Today   Subjective    Sonya Nielandtoday has no headache,no chest abdominal pain,no new weakness tingling or numbness, feels much better wants to go home today.     Objective   Blood pressure 118/62, pulse 82, temperature 97.9 F (36.6 C), temperature source Oral, resp. rate 16, height 5' 6"  (1.676 m), weight 98.431 kg (217 lb), SpO2 98 %.   Intake/Output Summary (Last 24 hours) at 01/12/16 1011 Last data filed at 01/12/16 0650  Gross per 24 hour  Intake   1000 ml  Output      0 ml  Net   1000 ml    Exam Awake Alert, Oriented x 3, No new F.N deficits, Normal affect .AT,PERRAL Supple Neck,No JVD, No cervical lymphadenopathy appriciated.  Symmetrical Chest wall movement, Good air movement bilaterally, CTAB RRR,No Gallops,Rubs or new Murmurs, No Parasternal Heave +ve  B.Sounds, Abd Soft, Non tender, No organomegaly appriciated, No rebound -guarding or rigidity. No Cyanosis, Clubbing or edema, No new Rash or bruise   Data Review   CBC w Diff: Lab Results  Component Value Date   WBC 5.4 01/12/2016   WBC 2.2* 12/28/2015   HGB 9.1* 01/12/2016   HGB 10.6* 12/28/2015   HCT 28.4* 01/12/2016   HCT 32.3* 12/28/2015   PLT 234 01/12/2016   PLT 166 12/28/2015   LYMPHOPCT 22 01/12/2016   LYMPHOPCT 47.0 12/28/2015   MONOPCT 3 01/12/2016   MONOPCT 8.7 12/28/2015   EOSPCT 0 01/12/2016   EOSPCT 5.3 12/28/2015   BASOPCT 0 01/12/2016   BASOPCT 1.5 12/28/2015    CMP: Lab Results  Component Value Date   NA 135 01/12/2016   NA 139 12/28/2015   K 4.6 01/12/2016   K 3.6 12/28/2015   CL 108 01/12/2016   CL 105 12/17/2012   CO2 23 01/12/2016   CO2 26 12/28/2015   BUN 9 01/12/2016   BUN 4.9* 12/28/2015   CREATININE 0.71 01/12/2016   CREATININE 0.8 12/28/2015   CREATININE 0.89 10/03/2015   PROT  6.3* 01/12/2016   PROT 6.8 12/28/2015   ALBUMIN 3.0* 01/12/2016   ALBUMIN 3.1* 12/28/2015   BILITOT 0.9 01/12/2016   BILITOT 0.83 12/28/2015   ALKPHOS 65 01/12/2016   ALKPHOS 74 12/28/2015   AST 17 01/12/2016   AST 15 12/28/2015   ALT 15 01/12/2016   ALT 18 12/28/2015  .   Total Time in preparing paper work, data evaluation and todays exam - 35 minutes  Thurnell Lose M.D on 01/12/2016 at 10:11 AM  [facility]   Office  [phone]

## 2016-01-14 LAB — CULTURE, BLOOD (ROUTINE X 2)
CULTURE: NO GROWTH
Culture: NO GROWTH

## 2016-01-16 ENCOUNTER — Ambulatory Visit: Payer: Self-pay | Admitting: Internal Medicine

## 2016-01-16 ENCOUNTER — Telehealth: Payer: Self-pay | Admitting: *Deleted

## 2016-01-16 DIAGNOSIS — C9000 Multiple myeloma not having achieved remission: Secondary | ICD-10-CM

## 2016-01-16 DIAGNOSIS — Z5111 Encounter for antineoplastic chemotherapy: Secondary | ICD-10-CM

## 2016-01-16 MED ORDER — MORPHINE SULFATE ER 15 MG PO TBCR: 15.0000 mg | EXTENDED_RELEASE_TABLET | Freq: Two times a day (BID) | ORAL | Status: DC

## 2016-01-16 MED ORDER — HYDROCODONE-ACETAMINOPHEN 7.5-325 MG PO TABS
1.0000 | ORAL_TABLET | Freq: Four times a day (QID) | ORAL | Status: DC | PRN
Start: 2016-01-16 — End: 2016-02-16

## 2016-01-16 NOTE — Telephone Encounter (Signed)
Pt called states she is going to see her PCP , does she still need a f/u visit with MD after being in hospital for 3 days.. Will review with MD.

## 2016-01-16 NOTE — Telephone Encounter (Signed)
Notified pt to keep schedule appt with MD 7/31. Hold Pomalyst until MD visit.

## 2016-01-17 ENCOUNTER — Encounter: Payer: Self-pay | Admitting: Internal Medicine

## 2016-01-17 ENCOUNTER — Ambulatory Visit (HOSPITAL_COMMUNITY)
Admission: RE | Admit: 2016-01-17 | Discharge: 2016-01-17 | Disposition: A | Discharge status: Home / Self Care | Payer: Medicare Other | Source: Ambulatory Visit | Attending: Internal Medicine | Admitting: Internal Medicine

## 2016-01-17 ENCOUNTER — Telehealth: Payer: Self-pay | Admitting: Medical Oncology

## 2016-01-17 ENCOUNTER — Ambulatory Visit (INDEPENDENT_AMBULATORY_CARE_PROVIDER_SITE_OTHER): Payer: Medicare Other | Admitting: Internal Medicine

## 2016-01-17 VITALS — BP 106/60 | HR 88 | Temp 98.2°F | Resp 16 | Ht 65.0 in | Wt 208.0 lb

## 2016-01-17 DIAGNOSIS — J189 Pneumonia, unspecified organism: Secondary | ICD-10-CM | POA: Insufficient documentation

## 2016-01-17 DIAGNOSIS — Z79899 Other long term (current) drug therapy: Secondary | ICD-10-CM | POA: Diagnosis not present

## 2016-01-17 DIAGNOSIS — C9002 Multiple myeloma in relapse: Secondary | ICD-10-CM | POA: Diagnosis not present

## 2016-01-17 DIAGNOSIS — C9000 Multiple myeloma not having achieved remission: Secondary | ICD-10-CM

## 2016-01-17 DIAGNOSIS — Z5111 Encounter for antineoplastic chemotherapy: Secondary | ICD-10-CM

## 2016-01-17 LAB — CBC WITH DIFFERENTIAL/PLATELET
BASOS ABS: 49 {cells}/uL (ref 0–200)
Basophils Relative: 1 %
EOS ABS: 98 {cells}/uL (ref 15–500)
Eosinophils Relative: 2 %
HEMATOCRIT: 33.9 % — AB (ref 35.0–45.0)
HEMOGLOBIN: 10.9 g/dL — AB (ref 11.7–15.5)
LYMPHS ABS: 1519 {cells}/uL (ref 850–3900)
LYMPHS PCT: 31 %
MCH: 33.4 pg — ABNORMAL HIGH (ref 27.0–33.0)
MCHC: 32.2 g/dL (ref 32.0–36.0)
MCV: 104 fL — AB (ref 80.0–100.0)
MONO ABS: 735 {cells}/uL (ref 200–950)
MPV: 9.6 fL (ref 7.5–12.5)
Monocytes Relative: 15 %
NEUTROS PCT: 51 %
Neutro Abs: 2499 cells/uL (ref 1500–7800)
Platelets: 256 10*3/uL (ref 140–400)
RBC: 3.26 MIL/uL — AB (ref 3.80–5.10)
RDW: 16.3 % — AB (ref 11.0–15.0)
WBC: 4.9 10*3/uL (ref 3.8–10.8)

## 2016-01-17 NOTE — Telephone Encounter (Signed)
I told pt prescriptions are ready for pick up in am.

## 2016-01-17 NOTE — Progress Notes (Signed)
Assessment and Plan: [phone] (all) [phone] (if 7 days high complexity)  hospital visit follow up for    1. HCAP (healthcare-associated pneumonia) -abx completed -symptomatically improved - DG Chest 2 View; Future - Ambulatory referral to [facility]  2. Multiple myeloma in relapse [facility]) -stop polymist until seen by Dr. Earlie Server  3. Medication management  - CBC with Differential/Platelet - BASIC METABOLIC PANEL WITH GFR - Hepatic function panel - Magnesium    Over 40 minutes of exam, counseling, chart review, and complex, high level critical decision making was performed this visit.   HPI 65 y.o.female presents for follow up from the hospital. Admit date to the hospital was 01/09/16, patient was discharged from the hospital on 01/12/16 and our office contacted the office the day after discharge to set up a follow up appointment, patient was admitted for: weakness, acute kidney injury and HCAP.  [name] is immunocompromised secondary to chemotherapy due to her multiple myeloma.  [name] was treated with a full course of levaquin and prednisone.  [name] notes that [name] did have some chest pain prior to going in the hospital and [name] is improved with the chest pain, but it is a little better.  [name] still feels like [name] is really weak and is not feeling well.  [name] reports that [name] was recommended to do some physical therapy.  [name] is trying to walk right now but feels like [name] gets a little short of breath with activity.  [name] is due to see Dr. Earlie Server on 01/29/16.  [name] is not taking polymist until [name] goes back to see him.  [name] is taking a large amount of potassium currently.  [name] is also taking a significant amount of magnesium too.    Images while in the hospital: Dg Chest 2 View  01/09/2016  CLINICAL DATA:  Dizziness EXAM: CHEST  2 VIEW COMPARISON:  10/31/2015 FINDINGS: Cardiac shadow is within normal limits. A right chest wall port is again seen. The lungs are well aerated bilaterally. Mild increased density  is noted in the lingula consistent with early infiltrate. No other focal abnormality is noted. IMPRESSION: Early lingular infiltrate. Electronically Signed   By: Inez Catalina M.D.   On: 01/09/2016 19:42   Ct Head Wo Contrast  01/09/2016  CLINICAL DATA:  65 year old female with history of multiple myeloma presenting with weakness and dizziness. History of brain tumor with radiation treatment. EXAM: CT HEAD WITHOUT CONTRAST TECHNIQUE: Contiguous axial images were obtained from the base of the skull through the vertex without intravenous contrast. COMPARISON:  Head CT dated 08/09/2013 and MRI dated 01/04/2016 FINDINGS: The ventricles and the sulci are appropriate in size for the patient's age. There is no intracranial hemorrhage. No midline shift or mass effect identified. The gray-white matter differentiation is preserved. Multiple bilateral calvarial lytic lesion with the largest in the right parietal calvarium similar to prior study and compatible with known multiple myeloma. No acute calvarial fracture. IMPRESSION: No acute intracranial pathology. Calvarial lytic lesions compatible with known multiple myeloma. These lesions are stable since 08/09/2013. Electronically Signed   By: Anner Crete M.D.   On: 01/09/2016 20:03   Ct Angio Chest Pe W/cm &/or Wo Cm  01/09/2016  CLINICAL DATA:  Shortness of breath, history of multiple myeloma EXAM: CT ANGIOGRAPHY CHEST WITH CONTRAST TECHNIQUE: Multidetector CT imaging of the chest was performed using the standard protocol during bolus administration of intravenous contrast. Multiplanar CT image reconstructions and MIPs were obtained to evaluate the vascular anatomy. CONTRAST:  100  mL Isovue 370 COMPARISON:  None. FINDINGS: Mediastinum/Lymph Nodes: No pulmonary emboli or thoracic aortic dissection identified. No masses or pathologically enlarged lymph nodes identified. Lungs/Pleura: There is lingular airspace disease. There is mild patchy right middle lobe airspace  disease. There is a small area of consolidation in the posterior left lower lobe. Upper abdomen: No acute findings. Musculoskeletal: No chest wall mass or suspicious bone lesions identified. Review of the MIP images confirms the above findings. IMPRESSION: 1. No evidence of pulmonary embolus. 2. Lingular airspace disease. Mild patchy right middle lobe airspace disease. Small area of consolidation in the posterior left lower lobe. Overall findings are concerning for multilobar numb Electronically Signed   By: Kathreen Devoid   On: 01/09/2016 21:13    Past Medical History  Diagnosis Date  . Hypercholesterolemia   . Compression fracture 04/08/2007    pathologic compression fracture  . Hypothyroidism   . FHx: chemotherapy     s/p 5 cycle revlimid/low dose decadron,s/p velcade,doxil,decadron,  . Hx of radiation therapy 05/05/07-05/18/07,& 03/05/11-03/21/11-    l3&l5 in 2008, t2-t6 03/2011  . GERD (gastroesophageal reflux disease)   . Insomnia     associated with steroids  . Constipation     takes oxycontin,vicodin  . Hx of radiation therapy 05/05/2007 to 05/18/2007    palliative, L3-5  . Hx of radiation therapy 03/05/2011 to 03/21/2011    palliative T2-T6, c-spine  . 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 PONV (postoperative nausea and vomiting)   . Multiple myeloma ([place]) dx'd 2009  . Metastasis to bone ([place])   . Family history of anesthesia complication     "daughter gets PONV too"  . Pneumonia     "several times"  . Stroke [facility]) 2014    denies residual on 01/27/2014     No Known Allergies    Current Outpatient Prescriptions on File Prior to Visit  Medication Sig Dispense Refill  . ALPRAZolam (XANAX) 0.5 MG tablet TAKE 1/2 TO 1 TABLET 2 TO 3 TIMES DAILY AS NEEDED FOR ANXIETY 270 tablet 1  . aspirin 81 MG tablet Take 81 mg by mouth as directed. Mondays, Wednesdays, and Fridays    . benzonatate (TESSALON) 200 MG capsule Take 1 perle 3 x/day to prevent  cough 30 capsule 1  . citalopram (CELEXA) 40 MG tablet TAKE 1 TABLET (40 MG TOTAL) BY MOUTH DAILY. 90 tablet 2  . cyclobenzaprine (FLEXERIL) 10 MG tablet Take 1 tablet (10 mg total) by mouth 3 (three) times daily as needed for muscle spasms. 90 tablet 3  . dexamethasone (DECADRON) 4 MG tablet TAKE (10) TABLETS BY MOUTH EVERY FRIDAY (Patient taking differently: TAKE (5) TABLETS BY MOUTH EVERY FRIDAY) 40 tablet 1  . diphenoxylate-atropine (LOMOTIL) 2.5-0.025 MG tablet TAKE 2 TABLETS BY MOUTH 4 TIMES A DAY AS NEEDED FOR DIARRHEA 30 tablet 1  . Eszopiclone 3 MG TABS TAKE 1 TABLET BY MOUTH AT BEDTIME AS NEEDED FOR SLEEP 30 tablet 2  . furosemide (LASIX) 40 MG tablet TAKE 1 TABLET BY MOUTH TWICE A DAY FOR FLUID AND SWELLING 90 tablet 1  . gabapentin (NEURONTIN) 600 MG tablet TAKE 1 TABLET 4 X DAILY AS NEEDED FOR PAIN OR CRAMPS 120 tablet 4  . HYDROcodone-acetaminophen (NORCO) 7.5-325 MG tablet Take 1 tablet by mouth every 6 (six) hours as needed for moderate pain. 30 tablet 0  . KLOR-CON 10 10 MEQ tablet TAKE 1 TABLET BY MOUTH TWICE A DAY (Patient taking  differently: TAKE 1 TABLET BY MOUTH THREE TIMES DAILY X1 WEEK. --USUALLY ONLY TAKES TWO DAILY.) 180 tablet 1  . loperamide (IMODIUM) 2 MG capsule Take 2 mg by mouth as needed for diarrhea or loose stools. Reported on 10/02/2015    . Magnesium 500 MG TABS Take 500 mg by mouth 2 (two) times daily.     . mometasone (NASONEX) 50 MCG/ACT nasal spray PLACE 2 SPRAYS INTO THE NOSE DAILY. (Patient taking differently: PLACE 2 SPRAYS INTO THE NOSE DAILY AS NEEDED FOR ALLERGIES.) 51 g 1  . montelukast (SINGULAIR) 10 MG tablet TAKE 1 TABLET BY MOUTH EVERY DAY 30 tablet 2  . morphine (MS CONTIN) 15 MG 12 hr tablet Take 1 tablet (15 mg total) by mouth every 12 (twelve) hours. 60 tablet 0  . Multiple Vitamin (MULITIVITAMIN WITH MINERALS) TABS Take 1 tablet by mouth every morning.     . nystatin (MYCOSTATIN) 100000 UNIT/ML suspension Gargle 5 mL PO q6hours and spit out after  20-30 seconds.  Use for 1 week (Patient taking differently: Use as directed 5 mLs in the mouth or throat every 6 (six) hours as needed (sore throat/thrush.). ) 180 mL 0  . ondansetron (ZOFRAN-ODT) 8 MG disintegrating tablet TAKE 1 TABLET BY MOUTH EVERY 8 HOURS AS NEEDED FOR NAUSEA AND VOMITING 30 tablet 1  . pantoprazole (PROTONIX) 40 MG tablet TAKE 1 TABLET (40 MG TOTAL) BY MOUTH 2 (TWO) TIMES DAILY. (Patient taking differently: Take 40 mg by mouth daily. ) 90 tablet 1  . pomalidomide (POMALYST) 2 MG capsule Take 1 capsule (2 mg total) by mouth daily. Take with water on days 1-21. Repeat every 28 days. (Patient taking differently: Take 2 mg by mouth daily. Take with water on days 1-21. Repeat every 21 days.) 21 capsule 0  . SYNTHROID 100 MCG tablet Take  Tablet daily on an empty stomach for 30 minutes (Patient taking differently: Take 100 mcg by mouth. Take 171mg on an empty stomach once daily on the weekdays and take 570m daily on the weekends) 90 tablet 1   No current facility-administered medications on file prior to visit.    Review of Systems  Constitutional: Positive for malaise/fatigue. Negative for fever and chills.  HENT: Negative for congestion, ear pain and sore throat.   Respiratory: Positive for shortness of breath (only on exertion). Negative for cough, sputum production and wheezing.   Cardiovascular: Positive for chest pain (occasional) and leg swelling (chronic left leg edema). Negative for palpitations, orthopnea and claudication.  Gastrointestinal: Positive for diarrhea. Negative for heartburn, constipation, blood in stool and melena.  Genitourinary: Negative.   Neurological: Negative for dizziness, sensory change, loss of consciousness and headaches.  Psychiatric/Behavioral: Negative for depression. The patient is not nervous/anxious and does not have insomnia.      Physical Exam: Filed Weights   01/17/16 1141  Weight: 208 lb (94.348 kg)   BP 106/60 mmHg  Pulse 88   Temp(Src) 98.2 F (36.8 C) (Temporal)  Resp 16  Ht 5' 5"  (1.651 m)  Wt 208 lb (94.348 kg)  BMI 34.61 kg/m2  SpO2 98% General Appearance: Well nourished, in no apparent distress. Eyes: PERRLA, EOMs, conjunctiva no swelling or erythema Sinuses: No Frontal/maxillary tenderness ENT/Mouth: Ext aud canals clear, TMs without erythema, bulging. No erythema, swelling, or exudate on post pharynx.  Tonsils not swollen or erythematous. Hearing normal.  Neck: Supple, thyroid normal.  Respiratory: Respiratory effort normal, BS equal bilaterally without rales, rhonchi, wheezing or stridor.  Cardio: RRR with no  MRGs. Brisk peripheral pulses without edema.  Abdomen: Soft, + BS.  Non tender, no guarding, rebound, hernias, masses. Lymphatics: Non tender without lymphadenopathy.  Musculoskeletal: Full ROM, 5/5 strength, normal gait.  Skin: Warm, dry without rashes, lesions, ecchymosis.  Neuro: Cranial nerves intact. Normal muscle tone, no cerebellar symptoms. Sensation intact.  Psych: Awake and oriented X 3, normal affect, Insight and Judgment appropriate.     Starlyn Skeans, [name] 11:49 AM [facility]

## 2016-01-18 ENCOUNTER — Ambulatory Visit
Admission: RE | Admit: 2016-01-18 | Discharge: 2016-01-18 | Disposition: A | Discharge status: Home / Self Care | Payer: Medicare Other | Source: Ambulatory Visit | Attending: Internal Medicine | Admitting: Internal Medicine

## 2016-01-18 ENCOUNTER — Other Ambulatory Visit: Payer: Self-pay | Admitting: Internal Medicine

## 2016-01-18 DIAGNOSIS — Z1231 Encounter for screening mammogram for malignant neoplasm of breast: Secondary | ICD-10-CM | POA: Diagnosis not present

## 2016-01-18 DIAGNOSIS — I110 Hypertensive heart disease with heart failure: Secondary | ICD-10-CM | POA: Diagnosis not present

## 2016-01-18 DIAGNOSIS — N183 Chronic kidney disease, stage 3 (moderate): Secondary | ICD-10-CM | POA: Diagnosis not present

## 2016-01-18 DIAGNOSIS — K219 Gastro-esophageal reflux disease without esophagitis: Secondary | ICD-10-CM | POA: Diagnosis not present

## 2016-01-18 DIAGNOSIS — E039 Hypothyroidism, unspecified: Secondary | ICD-10-CM | POA: Diagnosis not present

## 2016-01-18 DIAGNOSIS — Z9484 Stem cells transplant status: Secondary | ICD-10-CM | POA: Diagnosis not present

## 2016-01-18 DIAGNOSIS — E1122 Type 2 diabetes mellitus with diabetic chronic kidney disease: Secondary | ICD-10-CM | POA: Diagnosis not present

## 2016-01-18 DIAGNOSIS — C9002 Multiple myeloma in relapse: Secondary | ICD-10-CM | POA: Diagnosis not present

## 2016-01-18 DIAGNOSIS — C7951 Secondary malignant neoplasm of bone: Secondary | ICD-10-CM | POA: Diagnosis not present

## 2016-01-18 DIAGNOSIS — Z8673 Personal history of transient ischemic attack (TIA), and cerebral infarction without residual deficits: Secondary | ICD-10-CM | POA: Diagnosis not present

## 2016-01-18 DIAGNOSIS — R269 Unspecified abnormalities of gait and mobility: Secondary | ICD-10-CM | POA: Diagnosis not present

## 2016-01-18 LAB — BASIC METABOLIC PANEL WITH GFR
BUN: 11 mg/dL (ref 7–25)
CALCIUM: 9.3 mg/dL (ref 8.6–10.4)
CO2: 23 mmol/L (ref 20–31)
Chloride: 104 mmol/L (ref 98–110)
Creat: 1 mg/dL — ABNORMAL HIGH (ref 0.50–0.99)
GFR, EST AFRICAN AMERICAN: 69 mL/min (ref >=60)
GFR, EST NON AFRICAN AMERICAN: 60 mL/min (ref >=60)
GLUCOSE: 117 mg/dL — AB (ref 65–99)
Potassium: 3.7 mmol/L (ref 3.5–5.3)
Sodium: 139 mmol/L (ref 135–146)

## 2016-01-18 LAB — HEPATIC FUNCTION PANEL
ALK PHOS: 54 U/L (ref 33–130)
ALT: 14 U/L (ref 6–29)
AST: 15 U/L (ref 10–35)
Albumin: 3.8 g/dL (ref 3.6–5.1)
BILIRUBIN INDIRECT: 0.6 mg/dL (ref 0.2–1.2)
Bilirubin, Direct: 0.2 mg/dL (ref <=0.2)
TOTAL PROTEIN: 7.3 g/dL (ref 6.1–8.1)
Total Bilirubin: 0.8 mg/dL (ref 0.2–1.2)

## 2016-01-18 LAB — MAGNESIUM: Magnesium: 1.7 mg/dL (ref 1.5–2.5)

## 2016-01-19 ENCOUNTER — Ambulatory Visit: Payer: Self-pay

## 2016-01-24 DIAGNOSIS — K219 Gastro-esophageal reflux disease without esophagitis: Secondary | ICD-10-CM | POA: Diagnosis not present

## 2016-01-24 DIAGNOSIS — C7951 Secondary malignant neoplasm of bone: Secondary | ICD-10-CM | POA: Diagnosis not present

## 2016-01-24 DIAGNOSIS — Z8673 Personal history of transient ischemic attack (TIA), and cerebral infarction without residual deficits: Secondary | ICD-10-CM | POA: Diagnosis not present

## 2016-01-24 DIAGNOSIS — Z9484 Stem cells transplant status: Secondary | ICD-10-CM | POA: Diagnosis not present

## 2016-01-24 DIAGNOSIS — N183 Chronic kidney disease, stage 3 (moderate): Secondary | ICD-10-CM | POA: Diagnosis not present

## 2016-01-24 DIAGNOSIS — C9002 Multiple myeloma in relapse: Secondary | ICD-10-CM | POA: Diagnosis not present

## 2016-01-24 DIAGNOSIS — I110 Hypertensive heart disease with heart failure: Secondary | ICD-10-CM | POA: Diagnosis not present

## 2016-01-24 DIAGNOSIS — E039 Hypothyroidism, unspecified: Secondary | ICD-10-CM | POA: Diagnosis not present

## 2016-01-24 DIAGNOSIS — R269 Unspecified abnormalities of gait and mobility: Secondary | ICD-10-CM | POA: Diagnosis not present

## 2016-01-24 DIAGNOSIS — E1122 Type 2 diabetes mellitus with diabetic chronic kidney disease: Secondary | ICD-10-CM | POA: Diagnosis not present

## 2016-01-27 ENCOUNTER — Other Ambulatory Visit: Payer: Self-pay | Admitting: Internal Medicine

## 2016-01-29 ENCOUNTER — Ambulatory Visit (HOSPITAL_BASED_OUTPATIENT_CLINIC_OR_DEPARTMENT_OTHER): Payer: Medicare Other | Admitting: Internal Medicine

## 2016-01-29 ENCOUNTER — Telehealth: Payer: Self-pay | Admitting: Internal Medicine

## 2016-01-29 ENCOUNTER — Encounter: Payer: Self-pay | Admitting: Internal Medicine

## 2016-01-29 ENCOUNTER — Telehealth: Payer: Self-pay | Admitting: Medical Oncology

## 2016-01-29 ENCOUNTER — Other Ambulatory Visit: Payer: Self-pay | Admitting: Medical Oncology

## 2016-01-29 ENCOUNTER — Other Ambulatory Visit (HOSPITAL_BASED_OUTPATIENT_CLINIC_OR_DEPARTMENT_OTHER): Payer: Medicare Other

## 2016-01-29 VITALS — BP 116/65 | HR 84 | Temp 97.6°F | Resp 17 | Ht 65.0 in | Wt 213.6 lb

## 2016-01-29 DIAGNOSIS — Z5111 Encounter for antineoplastic chemotherapy: Secondary | ICD-10-CM

## 2016-01-29 DIAGNOSIS — C9002 Multiple myeloma in relapse: Secondary | ICD-10-CM | POA: Diagnosis not present

## 2016-01-29 DIAGNOSIS — M549 Dorsalgia, unspecified: Secondary | ICD-10-CM

## 2016-01-29 DIAGNOSIS — E876 Hypokalemia: Secondary | ICD-10-CM | POA: Diagnosis not present

## 2016-01-29 LAB — COMPREHENSIVE METABOLIC PANEL
ALT: 17 U/L (ref 0–55)
AST: 25 U/L (ref 5–34)
Albumin: 3.1 g/dL — ABNORMAL LOW (ref 3.5–5.0)
Alkaline Phosphatase: 54 U/L (ref 40–150)
Anion Gap: 9 mEq/L (ref 3–11)
BILIRUBIN TOTAL: 0.9 mg/dL (ref 0.20–1.20)
BUN: 7.3 mg/dL (ref 7.0–26.0)
CALCIUM: 8.9 mg/dL (ref 8.4–10.4)
CHLORIDE: 105 meq/L (ref 98–109)
CO2: 27 meq/L (ref 22–29)
CREATININE: 0.9 mg/dL (ref 0.6–1.1)
EGFR: 84 mL/min/{1.73_m2} — ABNORMAL LOW (ref >90)
Glucose: 97 mg/dl (ref 70–140)
Potassium: 3.2 mEq/L — ABNORMAL LOW (ref 3.5–5.1)
Sodium: 141 mEq/L (ref 136–145)
Total Protein: 6.8 g/dL (ref 6.4–8.3)

## 2016-01-29 LAB — CBC WITH DIFFERENTIAL/PLATELET
BASO%: 1 % (ref 0.0–2.0)
Basophils Absolute: 0 10*3/uL (ref 0.0–0.1)
EOS%: 2.3 % (ref 0.0–7.0)
Eosinophils Absolute: 0.1 10*3/uL (ref 0.0–0.5)
HEMATOCRIT: 30.6 % — AB (ref 34.8–46.6)
HGB: 9.9 g/dL — ABNORMAL LOW (ref 11.6–15.9)
LYMPH#: 1.4 10*3/uL (ref 0.9–3.3)
LYMPH%: 50.8 % — ABNORMAL HIGH (ref 14.0–49.7)
MCH: 33.6 pg (ref 25.1–34.0)
MCHC: 32.5 g/dL (ref 31.5–36.0)
MCV: 103.5 fL — ABNORMAL HIGH (ref 79.5–101.0)
MONO#: 0.4 10*3/uL (ref 0.1–0.9)
MONO%: 13.1 % (ref 0.0–14.0)
NEUT%: 32.8 % — AB (ref 38.4–76.8)
NEUTROS ABS: 0.9 10*3/uL — AB (ref 1.5–6.5)
PLATELETS: 212 10*3/uL (ref 145–400)
RBC: 2.95 10*6/uL — ABNORMAL LOW (ref 3.70–5.45)
RDW: 16.5 % — AB (ref 11.2–14.5)
WBC: 2.8 10*3/uL — AB (ref 3.9–10.3)

## 2016-01-29 MED ORDER — POMALIDOMIDE 2 MG PO CAPS: 2.0000 mg | ORAL_CAPSULE | Freq: Every day | ORAL | 0 refills | Status: DC

## 2016-01-29 NOTE — Telephone Encounter (Signed)
per pof to sch pt appt-gave pt copy of avs °

## 2016-01-29 NOTE — Telephone Encounter (Signed)
She also told me she has a headache that woke her up this afternoon.Hurts all over like a squeezing sensation. Note to [place].

## 2016-01-29 NOTE — Progress Notes (Signed)
[place] [phone]   Fax:([phone]) [place], [place] [address]  DIAGNOSIS: Recurrent multiple myeloma initially diagnosed in October 2008.   PRIOR THERAPY:  1. Status post palliative radiotherapy to the lumbar spine between L3 and L5. The patient received a total dose of 3000 cGy in 10 fractions under the care of Dr. Lisbeth Renshaw between May 05, 2007 through May 18, 2007. 2. Status post 5 cycles of systemic chemotherapy with Revlimid and low-dose Decadron with good response to this treatment. 3. Status post autologous peripheral blood stem cell transplant at [facility] on Nov 20, 2007 under the care of Dr. Valarie Merino. 4. Status post treatment for disease recurrence with Velcade, Doxil and Decadron. Last dose given Nov 09, 2009. Discontinued secondary to intolerance but the patient had a good response to treatment at that time. 5. Status post palliative radiotherapy to the T2-T6 thoracic vertebrae completed 03/21/2011 under the care of Dr. Lisbeth Renshaw. 6. Systemic chemotherapy with Velcade 1.3 mg per meter squared given on days 1, 4, 8 and 11, and Doxil at 30 mg per meter squared given on day 4 in addition to Decadron status post 4 cycles, discontinued secondary to intolerance. 7. Systemic therapy with Velcade 1.3 mg/M2 subcutaneously in addition to Decadron 40 mg by mouth on a weekly basis, status post 20 cycles. The patient had good response with this treatment but it is discontinue today secondary to worsening peripheral neuropathy. 8. Palliative radiotherapy to the skull lesion as well as the left hip area under the care of Dr. Lisbeth Renshaw. 9. Systemic chemotherapy with Carfilzomib 20 mg/M2 on days 1, 2,  8, 9, 13 and 16 every 4 weeks in addition to weekly Decadron 40 mg by mouth. First dose on 04/19/2013. Status post 4 cycles, discontinued recently secondary to cardiac  dysfunction.  CURRENT THERAPY:  1. Pomalyst 3 mg by mouth daily for 21 days every 4 weeks in addition to dexamethasone 40 mg on a weekly basis. First dose started 12/02/2013. Status post 22 cycles. She will start cycle #26 on 01/04/2016. Her dose of Pomalyst will be reduced to 2 mg by mouth daily for 21 days every 4 weeks starting from cycle 23.  2. Zometa 4 mg IV given every 3 months.   INTERVAL HISTORY: [name] 65 y.o. female returns to the clinic today for follow up visit accompanied by her husband. The patient is feeling fine today except for back pain. She is currently on MS Contin and Norco. She has been off treatment with Pomalyst for the last few weeks because of recent admission for pneumonia and persistent diarrhea and electrolyte imbalance. She is feeling much better today. The patient denied having any significant chest pain, shortness of breath, or hemoptysis. She has no nausea or vomiting. She has no weight loss or night sweats. She continues to have mild peripheral neuropathy and currently on gabapentin. She was seen at [facility] by [facility] for her back pain and he recommended only physical therapy. She had repeat CBC and comprehensive metabolic panel earlier today and she is here for evaluation and discussion of her lab results.  MEDICAL HISTORY: Past Medical History:  Diagnosis Date  . Compression fracture 04/08/2007   pathologic compression fracture  . Constipation    takes oxycontin,vicodin  . Family history of anesthesia complication    "daughter gets PONV too"  . FHx: chemotherapy    s/p 5  cycle revlimid/low dose decadron,s/p velcade,doxil,decadron,  . GERD (gastroesophageal reflux disease)   . History of autologous stem cell transplant ([place]) 11/20/2007   [facility], Dr Valarie Merino  . Hx of radiation therapy 05/05/07-05/18/07,& 03/05/11-03/21/11-   l3&l5 in 2008, t2-t6 03/2011  . Hx of radiation therapy 05/05/2007 to 05/18/2007   palliative, L3-5  . Hx of radiation  therapy 03/05/2011 to 03/21/2011   palliative T2-T6, c-spine  . Hypercholesterolemia   . Hypothyroidism   . Insomnia    associated with steroids  . Metastasis to bone ([place])   . Multiple myeloma ([facility]) dx'd 2009  . Pneumonia    "several times"  . PONV (postoperative nausea and vomiting)   . Stroke [facility]) 2014   denies residual on 01/27/2014    ALLERGIES:  has No Known Allergies.  MEDICATIONS:  Current Outpatient Prescriptions  Medication Sig Dispense Refill  . ALPRAZolam (XANAX) 0.5 MG tablet TAKE 1/2 TO 1 TABLET 2 TO 3 TIMES DAILY AS NEEDED FOR ANXIETY 270 tablet 1  . aspirin 81 MG tablet Take 81 mg by mouth as directed. Mondays, Wednesdays, and Fridays    . benzonatate (TESSALON) 200 MG capsule Take 1 perle 3 x/day to prevent cough 30 capsule 1  . citalopram (CELEXA) 40 MG tablet TAKE 1 TABLET (40 MG TOTAL) BY MOUTH DAILY. 90 tablet 2  . cyclobenzaprine (FLEXERIL) 10 MG tablet Take 1 tablet (10 mg total) by mouth 3 (three) times daily as needed for muscle spasms. 90 tablet 3  . dexamethasone (DECADRON) 4 MG tablet TAKE (10) TABLETS BY MOUTH EVERY FRIDAY 40 tablet 1  . diphenoxylate-atropine (LOMOTIL) 2.5-0.025 MG tablet TAKE 2 TABLETS BY MOUTH 4 TIMES A DAY AS NEEDED FOR DIARRHEA 30 tablet 1  . Eszopiclone 3 MG TABS TAKE 1 TABLET BY MOUTH AT BEDTIME AS NEEDED FOR SLEEP 30 tablet 2  . furosemide (LASIX) 40 MG tablet TAKE 1 TABLET BY MOUTH TWICE A DAY FOR FLUID AND SWELLING 90 tablet 1  . gabapentin (NEURONTIN) 600 MG tablet TAKE 1 TABLET 4 X DAILY AS NEEDED FOR PAIN OR CRAMPS 120 tablet 4  . HYDROcodone-acetaminophen (NORCO) 7.5-325 MG tablet Take 1 tablet by mouth every 6 (six) hours as needed for moderate pain. 30 tablet 0  . KLOR-CON 10 10 MEQ tablet TAKE 1 TABLET BY MOUTH TWICE A DAY (Patient taking differently: TAKE 1 TABLET BY MOUTH THREE TIMES DAILY X1 WEEK. --USUALLY ONLY TAKES TWO DAILY.) 180 tablet 1  . loperamide (IMODIUM) 2 MG capsule Take 2 mg by mouth as needed for diarrhea or  loose stools. Reported on 10/02/2015    . Magnesium 500 MG TABS Take 500 mg by mouth 2 (two) times daily.     . mometasone (NASONEX) 50 MCG/ACT nasal spray PLACE 2 SPRAYS INTO THE NOSE DAILY. (Patient taking differently: PLACE 2 SPRAYS INTO THE NOSE DAILY AS NEEDED FOR ALLERGIES.) 51 g 1  . montelukast (SINGULAIR) 10 MG tablet TAKE 1 TABLET BY MOUTH EVERY DAY 30 tablet 2  . morphine (MS CONTIN) 15 MG 12 hr tablet Take 1 tablet (15 mg total) by mouth every 12 (twelve) hours. 60 tablet 0  . Multiple Vitamin (MULITIVITAMIN WITH MINERALS) TABS Take 1 tablet by mouth every morning.     . nystatin (MYCOSTATIN) 100000 UNIT/ML suspension Gargle 5 mL PO q6hours and spit out after 20-30 seconds.  Use for 1 week (Patient taking differently: Use as directed 5 mLs in the mouth or throat every 6 (six) hours as needed (sore throat/thrush.). ) 180  mL 0  . ondansetron (ZOFRAN-ODT) 8 MG disintegrating tablet TAKE 1 TABLET BY MOUTH EVERY 8 HOURS AS NEEDED FOR NAUSEA AND VOMITING 30 tablet 1  . pantoprazole (PROTONIX) 40 MG tablet TAKE 1 TABLET (40 MG TOTAL) BY MOUTH 2 (TWO) TIMES DAILY. (Patient taking differently: Take 40 mg by mouth daily. ) 90 tablet 1  . pomalidomide (POMALYST) 2 MG capsule Take 1 capsule (2 mg total) by mouth daily. Take with water on days 1-21. Repeat every 28 days. 21 capsule 0  . SYNTHROID 100 MCG tablet Take  Tablet daily on an empty stomach for 30 minutes (Patient taking differently: Take 100 mcg by mouth. Take 119mg on an empty stomach once daily on the weekdays and take 553m daily on the weekends) 90 tablet 1   No current facility-administered medications for this visit.     SURGICAL HISTORY:  Past Surgical History:  Procedure Laterality Date  . CHOLECYSTECTOMY  1980's  . KNEE ARTHROSCOPY Right    "put pin in"  . KNEE ARTHROSCOPY Right    "took pin out and corrected what was wrong"  . PORTACATH PLACEMENT Right 2009  . VAGINAL HYSTERECTOMY  1980's  . VIDEO BRONCHOSCOPY  07/30/2011    Procedure: VIDEO BRONCHOSCOPY WITHOUT FLUORO;  Surgeon: KeKathee DeltonMD;  Location: [facility];  Service: Cardiopulmonary;  Laterality: Bilateral;    REVIEW OF SYSTEMS:  A comprehensive review of systems was negative except for: Constitutional: positive for fatigue Musculoskeletal: positive for back pain   PHYSICAL EXAMINATION: General appearance: alert, cooperative, fatigued and no distress Head: Normocephalic, without obvious abnormality, atraumatic Neck: no adenopathy, no JVD, supple, symmetrical, trachea midline and thyroid not enlarged, symmetric, no tenderness/mass/nodules Lymph nodes: Cervical, supraclavicular, and axillary nodes normal. Resp: clear to auscultation bilaterally Back: symmetric, no curvature. ROM normal. No CVA tenderness. Cardio: regular rate and rhythm, S1, S2 normal, no murmur, click, rub or gallop GI: soft, non-tender; bowel sounds normal; no masses,  no organomegaly Extremities: edema 1+ edema of left lower extremity. Neurologic: Alert and oriented X 3, normal strength and tone. Normal symmetric reflexes. Normal coordination and gait  ECOG PERFORMANCE STATUS: 1 - Symptomatic but completely ambulatory  Blood pressure 116/65, pulse 84, temperature 97.6 F (36.4 C), temperature source Oral, resp. rate 17, height 5' 5"  (1.651 m), weight 213 lb 9.6 oz (96.9 kg), SpO2 97 %.  LABORATORY DATA: Lab Results  Component Value Date   WBC 2.8 (L) 01/29/2016   HGB 9.9 (L) 01/29/2016   HCT 30.6 (L) 01/29/2016   MCV 103.5 (H) 01/29/2016   PLT 212 01/29/2016      Chemistry      Component Value Date/Time   NA 141 01/29/2016 0915   K 3.2 (L) 01/29/2016 0915   CL 104 01/17/2016 1219   CL 105 12/17/2012 1015   CO2 27 01/29/2016 0915   BUN 7.3 01/29/2016 0915   CREATININE 0.9 01/29/2016 0915      Component Value Date/Time   CALCIUM 8.9 01/29/2016 0915   ALKPHOS 54 01/29/2016 0915   AST 25 01/29/2016 0915   ALT 17 01/29/2016 0915   BILITOT 0.90 01/29/2016 0915      Myeloma panel Performed 11/30/2015 showed beta 2 microglobulin 2.7, IgG 1136, IgA 105 and IgM 12. Free kappa light chain 26.97, free lambda light chain 137.72 and a kappa/lambda ratio 0.20  RADIOGRAPHIC STUDIES: Dg Chest 2 View  Result Date: 01/17/2016 CLINICAL DATA:  Healthcare associated pneumonia, presenting for follow-up. Multiple myeloma. EXAM: CHEST  2 VIEW  COMPARISON:  01/09/2016 chest radiograph. FINDINGS: Right internal jugular Port-A-Cath terminates at the cavoatrial junction. Stable cardiomediastinal silhouette with normal heart size. No pneumothorax. No pleural effusion. No pulmonary edema. Hazy lingular opacity is not appreciably changed. No new consolidative airspace disease. Cholecystectomy clips are seen in the right upper quadrant of the abdomen. IMPRESSION: No appreciable change in hazy lingular opacity, probably infectious/inflammatory. Recommend follow-up PA and lateral post treatment chest radiographs in 4-6 weeks. Electronically Signed   By: Ilona Sorrel M.D.   On: 01/17/2016 13:24   Dg Chest 2 View  Result Date: 01/09/2016 CLINICAL DATA:  Dizziness EXAM: CHEST  2 VIEW COMPARISON:  10/31/2015 FINDINGS: Cardiac shadow is within normal limits. A right chest wall port is again seen. The lungs are well aerated bilaterally. Mild increased density is noted in the lingula consistent with early infiltrate. No other focal abnormality is noted. IMPRESSION: Early lingular infiltrate. Electronically Signed   By: Inez Catalina M.D.   On: 01/09/2016 19:42   Ct Head Wo Contrast  Result Date: 01/09/2016 CLINICAL DATA:  65 year old female with history of multiple myeloma presenting with weakness and dizziness. History of brain tumor with radiation treatment. EXAM: CT HEAD WITHOUT CONTRAST TECHNIQUE: Contiguous axial images were obtained from the base of the skull through the vertex without intravenous contrast. COMPARISON:  Head CT dated 08/09/2013 and MRI dated 01/04/2016 FINDINGS: The  ventricles and the sulci are appropriate in size for the patient's age. There is no intracranial hemorrhage. No midline shift or mass effect identified. The gray-white matter differentiation is preserved. Multiple bilateral calvarial lytic lesion with the largest in the right parietal calvarium similar to prior study and compatible with known multiple myeloma. No acute calvarial fracture. IMPRESSION: No acute intracranial pathology. Calvarial lytic lesions compatible with known multiple myeloma. These lesions are stable since 08/09/2013. Electronically Signed   By: Anner Crete M.D.   On: 01/09/2016 20:03   Ct Angio Chest Pe W/cm &/or Wo Cm  Result Date: 01/09/2016 CLINICAL DATA:  Shortness of breath, history of multiple myeloma EXAM: CT ANGIOGRAPHY CHEST WITH CONTRAST TECHNIQUE: Multidetector CT imaging of the chest was performed using the standard protocol during bolus administration of intravenous contrast. Multiplanar CT image reconstructions and MIPs were obtained to evaluate the vascular anatomy. CONTRAST:  100 mL Isovue 370 COMPARISON:  None. FINDINGS: Mediastinum/Lymph Nodes: No pulmonary emboli or thoracic aortic dissection identified. No masses or pathologically enlarged lymph nodes identified. Lungs/Pleura: There is lingular airspace disease. There is mild patchy right middle lobe airspace disease. There is a small area of consolidation in the posterior left lower lobe. Upper abdomen: No acute findings. Musculoskeletal: No chest wall mass or suspicious bone lesions identified. Review of the MIP images confirms the above findings. IMPRESSION: 1. No evidence of pulmonary embolus. 2. Lingular airspace disease. Mild patchy right middle lobe airspace disease. Small area of consolidation in the posterior left lower lobe. Overall findings are concerning for multilobar numb Electronically Signed   By: Kathreen Devoid   On: 01/09/2016 21:13   Mr Brain W Wo Contrast  Result Date: 01/04/2016 CLINICAL DATA:   Multiple myeloma.  Skull lesion.  Confusion. EXAM: MRI HEAD WITHOUT AND WITH CONTRAST TECHNIQUE: Multiplanar, multiecho pulse sequences of the brain and surrounding structures were obtained without and with intravenous contrast. CONTRAST:  19m MULTIHANCE GADOBENATE DIMEGLUMINE 529 MG/ML IV SOLN COMPARISON:  08/24/2015. FINDINGS: No evidence for acute infarction, hemorrhage, mass lesion, hydrocephalus, or extra-axial fluid. Normal cerebral volume. Mild subcortical and periventricular T2 and FLAIR  hyperintensities, likely chronic microvascular ischemic change. Pituitary, pineal, and cerebellar tonsils unremarkable. No upper cervical lesions. Post infusion, abnormal pachymeningeal enhancement is redemonstrated, greatest associated with the RIGHT parietal lesion representing metastatic melanoma. The lesion is essentially unchanged in size, measuring 10 x 34 x 35 mm (R-L x A-P x C-C). Smaller focal calvarial lesions are seen in the LEFT parietal bone, and in the midline posteriorly. Overall, no significant change from priors. No midline shift or significant adjacent vasogenic edema. No sinus or mastoid disease. Negative orbits. Clival heterogeneity and upper cervical heterogeneity along with a permeative appearance to the diploic space, representing diffuse osseous disease. IMPRESSION: Stable findings of multiple myeloma. Mild RIGHT parietal pachymeningeal enhancement adjacent to the largest deposit, but no intra-axial edema or parenchymal lesions. No acute intracranial abnormality. Electronically Signed   By: Staci Righter M.D.   On: 01/04/2016 08:35   Mm Screening Breast Tomo Bilateral  Result Date: 01/19/2016 CLINICAL DATA:  Screening. EXAM: 2D DIGITAL SCREENING BILATERAL MAMMOGRAM WITH CAD AND ADJUNCT TOMO COMPARISON:  Previous exam(s). ACR Breast Density Category b: There are scattered areas of fibroglandular density. FINDINGS: There are no findings suspicious for malignancy. Images were processed with CAD.  IMPRESSION: No mammographic evidence of malignancy. A result letter of this screening mammogram will be mailed directly to the patient. RECOMMENDATION: Screening mammogram in one year. (Code:SM-B-01Y) BI-RADS CATEGORY  1: Negative. Electronically Signed   By: Everlean Alstrom M.D.   On: 01/19/2016 16:43   ASSESSMENT AND PLAN:  This is a very pleasant 65 years old [place] American female with recurrent multiple myeloma recently completed a course of treatment with Velcade and Decadron with improvement in her disease but this was discontinued secondary to peripheral neuropathy. The patient tolerated her treatment with Carfilzomib and Decadron fairly well except for the recent shortness breath and cough which was felt to be secondary to congestive heart failure from her treatment with Carfilzomib. This treatment was discontinued. She was started on treatment with Pomalyst and Decadron status post 24 cycles. She tolerated the last cycle of her treatment well. The myeloma panel is still pending. I recommended for the patient to continue her treatment with Pomalyst at reduced dose of 2 mg. The most recent myeloma panel showed mild increase of the free lambda light chain.  I recommended for the patient to continue her resume treatment with Pomalyst and Decadron as a scheduled.  For the back pain she will continue on MS Contin and Norco. For the hypokalemia, I advised the patient to increase her dose of potassium chloride 40 mEq in a.m. and 20 mEq in p.m. I would see the patient back for follow-up visit in 3 weeks for reevaluation with CBC, comprehensive metabolic panel and LDH for evaluation of her disease. She was advised to call immediately if she has any concerning symptoms in the interval. The patient voices understanding of current disease status and treatment options and is in agreement with the current care plan.  All questions were answered. The patient knows to call the clinic with any problems,  questions or concerns. We can certainly see the patient much sooner if necessary.  Disclaimer: This note was dictated with voice recognition software. Similar sounding words can inadvertently be transcribed and may be missed upon review.

## 2016-01-30 DIAGNOSIS — E1122 Type 2 diabetes mellitus with diabetic chronic kidney disease: Secondary | ICD-10-CM | POA: Diagnosis not present

## 2016-01-30 DIAGNOSIS — C7951 Secondary malignant neoplasm of bone: Secondary | ICD-10-CM | POA: Diagnosis not present

## 2016-01-30 DIAGNOSIS — R269 Unspecified abnormalities of gait and mobility: Secondary | ICD-10-CM | POA: Diagnosis not present

## 2016-01-30 DIAGNOSIS — K219 Gastro-esophageal reflux disease without esophagitis: Secondary | ICD-10-CM | POA: Diagnosis not present

## 2016-01-30 DIAGNOSIS — Z8673 Personal history of transient ischemic attack (TIA), and cerebral infarction without residual deficits: Secondary | ICD-10-CM | POA: Diagnosis not present

## 2016-01-30 DIAGNOSIS — I110 Hypertensive heart disease with heart failure: Secondary | ICD-10-CM | POA: Diagnosis not present

## 2016-01-30 DIAGNOSIS — Z9484 Stem cells transplant status: Secondary | ICD-10-CM | POA: Diagnosis not present

## 2016-01-30 DIAGNOSIS — C9002 Multiple myeloma in relapse: Secondary | ICD-10-CM | POA: Diagnosis not present

## 2016-01-30 DIAGNOSIS — N183 Chronic kidney disease, stage 3 (moderate): Secondary | ICD-10-CM | POA: Diagnosis not present

## 2016-01-30 DIAGNOSIS — E039 Hypothyroidism, unspecified: Secondary | ICD-10-CM | POA: Diagnosis not present

## 2016-02-01 ENCOUNTER — Telehealth: Payer: Self-pay | Admitting: Medical Oncology

## 2016-02-01 DIAGNOSIS — C9002 Multiple myeloma in relapse: Secondary | ICD-10-CM | POA: Diagnosis not present

## 2016-02-01 DIAGNOSIS — R269 Unspecified abnormalities of gait and mobility: Secondary | ICD-10-CM | POA: Diagnosis not present

## 2016-02-01 DIAGNOSIS — Z8673 Personal history of transient ischemic attack (TIA), and cerebral infarction without residual deficits: Secondary | ICD-10-CM | POA: Diagnosis not present

## 2016-02-01 DIAGNOSIS — N183 Chronic kidney disease, stage 3 (moderate): Secondary | ICD-10-CM | POA: Diagnosis not present

## 2016-02-01 DIAGNOSIS — I110 Hypertensive heart disease with heart failure: Secondary | ICD-10-CM | POA: Diagnosis not present

## 2016-02-01 DIAGNOSIS — E1122 Type 2 diabetes mellitus with diabetic chronic kidney disease: Secondary | ICD-10-CM | POA: Diagnosis not present

## 2016-02-01 DIAGNOSIS — Z8701 Personal history of pneumonia (recurrent): Secondary | ICD-10-CM | POA: Diagnosis not present

## 2016-02-01 DIAGNOSIS — E039 Hypothyroidism, unspecified: Secondary | ICD-10-CM | POA: Diagnosis not present

## 2016-02-01 DIAGNOSIS — Z9484 Stem cells transplant status: Secondary | ICD-10-CM | POA: Diagnosis not present

## 2016-02-01 DIAGNOSIS — C7951 Secondary malignant neoplasm of bone: Secondary | ICD-10-CM | POA: Diagnosis not present

## 2016-02-01 DIAGNOSIS — K219 Gastro-esophageal reflux disease without esophagitis: Secondary | ICD-10-CM | POA: Diagnosis not present

## 2016-02-01 NOTE — Telephone Encounter (Signed)
I left a message to pt that I got her message about 12 pills left and she can call back tomorrow if she has other questions.

## 2016-02-02 ENCOUNTER — Encounter: Payer: Self-pay | Admitting: Internal Medicine

## 2016-02-02 ENCOUNTER — Ambulatory Visit (INDEPENDENT_AMBULATORY_CARE_PROVIDER_SITE_OTHER): Payer: Medicare Other | Admitting: Internal Medicine

## 2016-02-02 VITALS — BP 120/68 | HR 92 | Temp 97.4°F | Resp 16 | Ht 64.75 in | Wt 215.0 lb

## 2016-02-02 DIAGNOSIS — R609 Edema, unspecified: Secondary | ICD-10-CM | POA: Diagnosis not present

## 2016-02-02 DIAGNOSIS — Z79899 Other long term (current) drug therapy: Secondary | ICD-10-CM | POA: Diagnosis not present

## 2016-02-02 DIAGNOSIS — E876 Hypokalemia: Secondary | ICD-10-CM

## 2016-02-02 DIAGNOSIS — R531 Weakness: Secondary | ICD-10-CM | POA: Diagnosis not present

## 2016-02-02 DIAGNOSIS — R5383 Other fatigue: Secondary | ICD-10-CM | POA: Diagnosis not present

## 2016-02-02 LAB — CBC WITH DIFFERENTIAL/PLATELET
Basophils Absolute: 0 cells/uL (ref 0–200)
Basophils Relative: 0 %
EOS PCT: 3 %
Eosinophils Absolute: 96 cells/uL (ref 15–500)
HCT: 30.4 % — ABNORMAL LOW (ref 35.0–45.0)
HEMOGLOBIN: 9.6 g/dL — AB (ref 11.7–15.5)
LYMPHS ABS: 896 {cells}/uL (ref 850–3900)
Lymphocytes Relative: 28 %
MCH: 33.2 pg — ABNORMAL HIGH (ref 27.0–33.0)
MCHC: 31.6 g/dL — AB (ref 32.0–36.0)
MCV: 105.2 fL — ABNORMAL HIGH (ref 80.0–100.0)
MONOS PCT: 11 %
MPV: 9.6 fL (ref 7.5–12.5)
Monocytes Absolute: 352 cells/uL (ref 200–950)
NEUTROS ABS: 1856 {cells}/uL (ref 1500–7800)
NEUTROS PCT: 58 %
PLATELETS: 227 10*3/uL (ref 140–400)
RBC: 2.89 MIL/uL — AB (ref 3.80–5.10)
RDW: 16.1 % — ABNORMAL HIGH (ref 11.0–15.0)
WBC: 3.2 10*3/uL — AB (ref 3.8–10.8)

## 2016-02-02 LAB — BASIC METABOLIC PANEL WITH GFR
BUN: 8 mg/dL (ref 7–25)
CO2: 26 mmol/L (ref 20–31)
Calcium: 9 mg/dL (ref 8.6–10.4)
Chloride: 104 mmol/L (ref 98–110)
Creat: 0.92 mg/dL (ref 0.50–0.99)
GFR, EST NON AFRICAN AMERICAN: 66 mL/min (ref >=60)
GFR, Est African American: 76 mL/min (ref >=60)
GLUCOSE: 107 mg/dL — AB (ref 65–99)
Potassium: 3.2 mmol/L — ABNORMAL LOW (ref 3.5–5.3)
SODIUM: 135 mmol/L (ref 135–146)

## 2016-02-02 LAB — MAGNESIUM: Magnesium: 1.4 mg/dL — ABNORMAL LOW (ref 1.5–2.5)

## 2016-02-02 NOTE — Patient Instructions (Signed)
Weakness Weakness is a lack of strength. It may be felt all over the body (generalized) or in one specific part of the body (focal). Some causes of weakness can be serious. You may need further medical evaluation, especially if you are elderly or you have a history of immunosuppression (such as chemotherapy or HIV), kidney disease, heart disease, or diabetes. CAUSES  Weakness can be caused by many different things, including:  Infection.  Physical exhaustion.  anemia.  Dehydration. This cause is more common in elderly people.  Side effects or electrolyte abnormalities from medicines, such as pain medicines or sedatives.  Emotional distress, anxiety, or depression.  Circulation problems, especially severe peripheral arterial disease.  Heart disease, such as rapid atrial fibrillation, bradycardia, or heart failure.  Nervous system disorders, such as Guillain-Barr syndrome, multiple sclerosis, or stroke.  TREATMENT  Treatment of weakness depends on the cause of your symptoms and can vary greatly. HOME CARE INSTRUCTIONS   Rest as needed.  Eat a well-balanced diet.  Try to get some exercise every day.  Only take over-the-counter or prescription medicines as directed by your caregiver. SEEK MEDICAL CARE IF:   Your weakness seems to be getting worse or spreads to other parts of your body.  You develop new aches or pains. SEEK IMMEDIATE MEDICAL CARE IF:   You cannot perform your normal daily activities, such as getting dressed and feeding yourself.  You cannot walk up and down stairs, or you feel exhausted when you do so.  You have shortness of breath or chest pain.  You have difficulty moving parts of your body.  You have weakness in only one area of the body or on only one side of the body.  You have a fever.  You have trouble speaking or swallowing.  You cannot control your bladder or bowel movements.  You have black or bloody vomit or stools. MAKE SURE  YOU:  Understand these instructions.  Will watch your condition.  Will get help right away if you are not doing well or get worse.   This information is not intended to replace advice given to you by your health care provider. Make sure you discuss any questions you have with your health care provider.   Document Released: 06/17/2005 Document Revised: 12/17/2011 Document Reviewed: 08/16/2011 Elsevier Interactive Patient Education [employer].

## 2016-02-03 ENCOUNTER — Encounter (HOSPITAL_COMMUNITY): Payer: Self-pay | Admitting: *Deleted

## 2016-02-03 ENCOUNTER — Emergency Department (HOSPITAL_COMMUNITY): Payer: Medicare Other

## 2016-02-03 ENCOUNTER — Emergency Department (HOSPITAL_COMMUNITY)
Admission: EM | Admit: 2016-02-03 | Discharge: 2016-02-03 | Disposition: A | Discharge status: Home / Self Care | Payer: Medicare Other | Attending: Emergency Medicine | Admitting: Emergency Medicine

## 2016-02-03 ENCOUNTER — Other Ambulatory Visit: Payer: Self-pay | Admitting: Internal Medicine

## 2016-02-03 ENCOUNTER — Encounter: Payer: Self-pay | Admitting: Internal Medicine

## 2016-02-03 DIAGNOSIS — E612 Magnesium deficiency: Secondary | ICD-10-CM

## 2016-02-03 DIAGNOSIS — R531 Weakness: Secondary | ICD-10-CM

## 2016-02-03 DIAGNOSIS — R1084 Generalized abdominal pain: Secondary | ICD-10-CM | POA: Diagnosis not present

## 2016-02-03 DIAGNOSIS — Z7982 Long term (current) use of aspirin: Secondary | ICD-10-CM | POA: Diagnosis not present

## 2016-02-03 DIAGNOSIS — E7800 Pure hypercholesterolemia, unspecified: Secondary | ICD-10-CM | POA: Diagnosis not present

## 2016-02-03 DIAGNOSIS — Z79899 Other long term (current) drug therapy: Secondary | ICD-10-CM

## 2016-02-03 DIAGNOSIS — Z87891 Personal history of nicotine dependence: Secondary | ICD-10-CM | POA: Diagnosis not present

## 2016-02-03 DIAGNOSIS — R109 Unspecified abdominal pain: Secondary | ICD-10-CM

## 2016-02-03 DIAGNOSIS — E876 Hypokalemia: Secondary | ICD-10-CM

## 2016-02-03 DIAGNOSIS — R1030 Lower abdominal pain, unspecified: Secondary | ICD-10-CM | POA: Diagnosis not present

## 2016-02-03 LAB — COMPREHENSIVE METABOLIC PANEL
ALBUMIN: 3.3 g/dL — AB (ref 3.5–5.0)
ALT: 19 U/L (ref 14–54)
ANION GAP: 5 (ref 5–15)
AST: 31 U/L (ref 15–41)
Alkaline Phosphatase: 55 U/L (ref 38–126)
BILIRUBIN TOTAL: 0.9 mg/dL (ref 0.3–1.2)
BUN: 10 mg/dL (ref 6–20)
CO2: 23 mmol/L (ref 22–32)
Calcium: 9.1 mg/dL (ref 8.9–10.3)
Chloride: 111 mmol/L (ref 101–111)
Creatinine, Ser: 0.75 mg/dL (ref 0.44–1.00)
GFR calc Af Amer: >60 mL/min (ref >60)
GFR calc non Af Amer: >60 mL/min (ref >60)
GLUCOSE: 137 mg/dL — AB (ref 65–99)
POTASSIUM: 3.5 mmol/L (ref 3.5–5.1)
SODIUM: 139 mmol/L (ref 135–145)
TOTAL PROTEIN: 7.1 g/dL (ref 6.5–8.1)

## 2016-02-03 LAB — I-STAT CG4 LACTIC ACID, ED
LACTIC ACID, VENOUS: 0.92 mmol/L (ref 0.5–1.9)
Lactic Acid, Venous: 2.61 mmol/L (ref 0.5–1.9)

## 2016-02-03 LAB — CBC WITH DIFFERENTIAL/PLATELET
BASOS ABS: 0 10*3/uL (ref 0.0–0.1)
Basophils Relative: 0 %
Eosinophils Absolute: 0 10*3/uL (ref 0.0–0.7)
Eosinophils Relative: 0 %
HEMATOCRIT: 30.8 % — AB (ref 36.0–46.0)
HEMOGLOBIN: 10.1 g/dL — AB (ref 12.0–15.0)
LYMPHS PCT: 38 %
Lymphs Abs: 1.4 10*3/uL (ref 0.7–4.0)
MCH: 33.8 pg (ref 26.0–34.0)
MCHC: 32.8 g/dL (ref 30.0–36.0)
MCV: 103 fL — AB (ref 78.0–100.0)
MONO ABS: 0.1 10*3/uL (ref 0.1–1.0)
Monocytes Relative: 2 %
NEUTROS ABS: 2.2 10*3/uL (ref 1.7–7.7)
Neutrophils Relative %: 60 %
Platelets: 242 10*3/uL (ref 150–400)
RBC: 2.99 MIL/uL — AB (ref 3.87–5.11)
RDW: 15.2 % (ref 11.5–15.5)
WBC: 3.7 10*3/uL — ABNORMAL LOW (ref 4.0–10.5)

## 2016-02-03 LAB — URINALYSIS, ROUTINE W REFLEX MICROSCOPIC
Bilirubin Urine: NEGATIVE
GLUCOSE, UA: NEGATIVE mg/dL
Hgb urine dipstick: NEGATIVE
Ketones, ur: NEGATIVE mg/dL
LEUKOCYTES UA: NEGATIVE
Nitrite: NEGATIVE
PROTEIN: NEGATIVE mg/dL
Specific Gravity, Urine: 1.023 (ref 1.005–1.030)
pH: 6 (ref 5.0–8.0)

## 2016-02-03 LAB — LIPASE, BLOOD: LIPASE: 25 U/L (ref 11–51)

## 2016-02-03 LAB — MAGNESIUM: MAGNESIUM: 1.5 mg/dL — AB (ref 1.7–2.4)

## 2016-02-03 MED ORDER — PROCHLORPERAZINE EDISYLATE 5 MG/ML IJ SOLN
10.0000 mg | Freq: Once | INTRAMUSCULAR | Status: AC
  Administered 2016-02-03: 10 mg via INTRAVENOUS
  Filled 2016-02-03: qty 2

## 2016-02-03 MED ORDER — IOPAMIDOL (ISOVUE-300) INJECTION 61%
100.0000 mL | Freq: Once | INTRAVENOUS | Status: AC | PRN
  Administered 2016-02-03: 100 mL via INTRAVENOUS

## 2016-02-03 MED ORDER — SODIUM CHLORIDE 0.9 % IV BOLUS (SEPSIS)
1000.0000 mL | Freq: Once | INTRAVENOUS | Status: AC
  Administered 2016-02-03: 1000 mL via INTRAVENOUS

## 2016-02-03 MED ORDER — MAGNESIUM SULFATE 2 GM/50ML IV SOLN
2.0000 g | Freq: Once | INTRAVENOUS | Status: AC
  Administered 2016-02-03: 2 g via INTRAVENOUS
  Filled 2016-02-03: qty 50

## 2016-02-03 MED ORDER — DIPHENHYDRAMINE HCL 50 MG/ML IJ SOLN
25.0000 mg | Freq: Once | INTRAMUSCULAR | Status: AC
  Administered 2016-02-03: 25 mg via INTRAVENOUS
  Filled 2016-02-03: qty 1

## 2016-02-03 MED ORDER — HEPARIN SOD (PORK) LOCK FLUSH 100 UNIT/ML IV SOLN
500.0000 [IU] | Freq: Once | INTRAVENOUS | Status: AC
  Administered 2016-02-03: 500 [IU]
  Filled 2016-02-03: qty 5

## 2016-02-03 MED ORDER — ACETAMINOPHEN 500 MG PO TABS
1000.0000 mg | ORAL_TABLET | Freq: Once | ORAL | Status: AC
  Administered 2016-02-03: 1000 mg via ORAL
  Filled 2016-02-03: qty 2

## 2016-02-03 NOTE — ED Notes (Signed)
Patient d/c'd self care with family.  F/U and instructions reviewed.  Patient and family verbalized understanding.

## 2016-02-03 NOTE — ED Notes (Signed)
Unable to collect labs patient wants her port access 

## 2016-02-03 NOTE — Progress Notes (Signed)
Subjective:    Patient ID: [name], female    DOB: [birth year], 65 y.o.   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 Patient has been partially compliant with recommended compression stockings and leg elevation. She also has developed hypokalemia and lown Magnesium from aggressive diuresis. Patient seems to have little insight to her lack of compliance with compression stockings and leg elevation and unrealistic expectations for resolution of her leg swelling. CV systems review is negative.  Medication Sig  . ALPRAZolam (XANAX) 0.5 MG tablet TAKE 1/2 TO 1 TABLET 2 TO 3 TIMES DAILY AS NEEDED FOR ANXIETY  . aspirin 81 MG tablet Take 81 mg by mouth as directed. Mondays, Wednesdays, and Fridays  . citalopram (CELEXA) 40 MG tablet TAKE 1 TABLET (40 MG TOTAL) BY MOUTH DAILY.  . cyclobenzaprine (FLEXERIL) 10 MG tablet Take 1 tablet (10 mg total) by mouth 3 (three) times daily as needed for muscle spasms.  Marland Kitchen dexamethasone (DECADRON) 4 MG tablet TAKE (10) TABLETS BY MOUTH EVERY FRIDAY (Patient taking differently: TAKE (5) TABLETS BY MOUTH EVERY FRIDAY)  . diphenoxylate-atropine (LOMOTIL) 2.5-0.025 MG tablet TAKE 2 TABLETS BY MOUTH 4 TIMES A DAY AS NEEDED FOR DIARRHEA  . Eszopiclone 3 MG TABS TAKE 1 TABLET BY MOUTH AT BEDTIME AS NEEDED FOR SLEEP  . furosemide (LASIX) 40 MG tablet TAKE 1 TABLET BY MOUTH TWICE A DAY FOR FLUID AND SWELLING  . gabapentin (NEURONTIN) 600 MG tablet TAKE 1 TABLET 4 X DAILY AS NEEDED FOR PAIN OR CRAMPS  . HYDROcodone-acetaminophen (NORCO) 7.5-325 MG tablet Take 1 tablet by mouth every 6 (six) hours as needed for moderate pain.  Marland Kitchen KLOR-CON 10 10 MEQ tablet TAKE 1 TABLET  BY MOUTH TWICE A DAY (Patient taking differently: TAKE 1 TABLET BY MOUTH THREE TIMES DAILY X1 WEEK. --USUALLY ONLY TAKES TWO DAILY.)  . loperamide (IMODIUM) 2 MG capsule Take 2 mg by mouth as needed for diarrhea or loose stools. Reported on 10/02/2015  . Magnesium 500 MG TABS Take 500 mg by mouth 2 (two) times daily.   . mometasone (NASONEX) 50 MCG/ACT nasal spray PLACE 2 SPRAYS INTO THE NOSE DAILY. (Patient taking differently: PLACE 2 SPRAYS INTO THE NOSE DAILY AS NEEDED FOR ALLERGIES.)  . montelukast (SINGULAIR) 10 MG tablet TAKE 1 TABLET BY MOUTH EVERY DAY  . morphine (MS CONTIN) 15 MG 12 hr tablet Take 1 tablet (15 mg total) by mouth every 12 (twelve) hours.  . Multiple Vitamin (MULITIVITAMIN WITH MINERALS) TABS Take 1 tablet by mouth every morning.   . ondansetron (ZOFRAN-ODT) 8 MG disintegrating tablet TAKE 1 TABLET BY MOUTH EVERY 8 HOURS AS NEEDED FOR NAUSEA AND VOMITING  . pantoprazole (PROTONIX) 40 MG tablet TAKE 1 TABLET (40 MG TOTAL) BY MOUTH 2 (TWO) TIMES DAILY. (Patient taking differently: Take 40 mg by mouth daily. )  . pomalidomide (POMALYST) 2 MG capsule Take 1 capsule (2 mg total) by mouth daily. Take with water on days 1-21. Repeat every 28 days.  Marland Kitchen SYNTHROID 100 MCG tablet Take  Tablet daily on an empty stomach for 30 minutes (Patient taking differently: Take 100 mcg by mouth. Take 156mg on an empty stomach once  daily on the weekdays and take 70mg daily on the weekends)  . benzonatate (TESSALON) 200 MG capsule Take 1 perle 3 x/day to prevent cough  . nystatin (MYCOSTATIN) 100000 UNIT/ML suspension Gargle 5 mL PO q6hours and spit out after 20-30 seconds.  Use for 1 week (Patient taking differently: Use as directed 5 mLs in the mouth or throat every 6 (six) hours as needed (sore throat/thrush.). )   No Known Allergies   Past Medical History:  Diagnosis Date  . Compression fracture 04/08/2007   pathologic compression fracture  . Constipation    takes oxycontin,vicodin  . Family  history of anesthesia complication    "daughter gets PONV too"  . FHx: chemotherapy    s/p 5 cycle revlimid/low dose decadron,s/p velcade,doxil,decadron,  . GERD (gastroesophageal reflux disease)   . [place]   [facility], Dr GValarie Merino . Hx of radiation therapy    l3&l5 in 2008, t2-t6 03/2011  . Hx of radiation therapy 05/18/2007   palliative, L3-5  . Hx of radiation therapy  03/21/2011   palliative T2-T6, c-spine  . Hypercholesterolemia   . Hypothyroidism   . Insomnia    associated with steroids  . [place]   . [place] dx[place]  . Pneumonia    "several times"  . postoperative nausea and vomiting   . Stroke ([facility] 2014   Past Surgical History:  Procedure Laterality Date  . CHOLECYSTECTOMY  1980's  . KNEE ARTHROSCOPY Right    "put pin in"  . KNEE ARTHROSCOPY Right    "took pin out and corrected what was wrong"  . PORTACATH PLACEMENT Right 2009  . VAGINAL HYSTERECTOMY  1980's  . VIDEO BRONCHOSCOPY  07/30/2011   Procedure: VIDEO BRONCHOSCOPY WITHOUT FLUORO;  Surgeon: KKathee Delton MD;  Location: [facility];  Service: Cardiopulmonary;  Laterality: Bilateral;   Review of Systems  10 point systems review negative except as above.    Objective:   Physical Exam  BP 120/68   Pulse 92   Temp 97.4 F (36.3 C)   Resp 16   Ht 5' 4.75" (1.645 m)   Wt 215 lb (97.5 kg)   BMI 36.05 kg/m   HEENT - Eac's patent. TM's Nl. EOM's full. PERRLA. NasoOroPharynx clear. Neck - supple. Nl Thyroid. Carotids 2+ & No bruits, nodes, JVD Chest - Clear equal BS w/o Rales, rhonchi, wheezes. Cor - Nl HS. RRR w/o sig MGR. PP 1(+). 1(+) ankle /pretibial edema asymmetric to the left. Abd - No palpable organomegaly, masses or tenderness. BS nl. MS- FROM w/o deformities. Muscle power, tone and bulk Nl. Gait Nl. Neuro - No obvious Cr N abnormalities. Sensory, motor and Cerebellar functions appear Nl w/o focal  abnormalities. Psyche - Mental status normal & appropriate.  No delusions, ideations or obvious mood abnormalities.    Assessment & Plan:   1. Weakness generalized  - CBC with Differential/Platelet - BASIC METABOLIC PANEL WITH GFR - Magnesium  2. Edema, unspecified type  3. Hypokalemia  - BASIC METABOLIC PANEL WITH GFR  4. Medication management  - CBC with Differential/Platelet - BASIC METABOLIC PANEL WITH GFR - Magnesium

## 2016-02-03 NOTE — ED Triage Notes (Signed)
Per EMS, pt complains of lower abdominal pain since this morning. Pt states she had diarrhea, which is common for her but is usually not painful. Pt has hx of bone cancer, is on chemo.

## 2016-02-03 NOTE — ED Provider Notes (Signed)
[facility] DEPT Provider Note   CSN: [id number] Arrival date & time: 02/03/16  1400  First Provider Contact:  First MD Initiated Contact with Patient 02/03/16 1601        History   Chief Complaint Chief Complaint  Patient presents with  . Abdominal Pain  . chemo pt    HPI [name] is a 65 y.o. female.  The history is provided by the patient.  Abdominal Pain   This is a new problem. The current episode started 6 to 12 hours ago. The problem occurs constantly. The problem has not changed since onset.The pain is located in the RLQ, LLQ and suprapubic region. The pain is mild. Pertinent negatives include fever, constipation and dysuria. Nothing aggravates the symptoms. Nothing relieves the symptoms.  Migraine  This is a new problem. Episode onset: 1 week. The problem occurs constantly. The problem has not changed since onset.Nothing aggravates the symptoms. Nothing relieves the symptoms. She has tried nothing for the symptoms.   Of note Pt does not describe a migraine and this is a limitation of the EMR. She describes uncomplicated headache.  Past Medical History:  Diagnosis Date  . Compression fracture 04/08/2007   pathologic compression fracture  . Constipation    takes oxycontin,vicodin  . Family history of anesthesia complication    "daughter gets PONV too"  . FHx: chemotherapy    s/p 5 cycle revlimid/low dose decadron,s/p velcade,doxil,decadron,  . GERD (gastroesophageal reflux disease)   . History of autologous stem cell transplant ([place]) 11/20/2007   [facility], Dr Valarie Merino  . Hx of radiation therapy 05/05/07-05/18/07,& 03/05/11-03/21/11-   l3&l5 in 2008, t2-t6 03/2011  . Hx of radiation therapy 05/05/2007 to 05/18/2007   palliative, L3-5  . Hx of radiation therapy 03/05/2011 to 03/21/2011   palliative T2-T6, c-spine  . Hypercholesterolemia   . Hypothyroidism   . Insomnia    associated with steroids  . Metastasis to bone ([place])   . Multiple myeloma ([place]) dx'd 2009    . Pneumonia    "several times"  . PONV (postoperative nausea and vomiting)   . Stroke [facility]) 2014   denies residual on 01/27/2014    Patient Active Problem List   Diagnosis Date Noted  . Weakness generalized 02/02/2016  . Edema 02/02/2016  . HCAP (healthcare-associated pneumonia) 01/09/2016  . Dizzy spells 12/29/2015  . Diarrhea 11/22/2015  . Hypomagnesemia 11/11/2015  . Hypokalemia 09/24/2015  . Bronchitis 09/24/2015  . Peripheral edema 08/29/2015  . Nausea without vomiting 08/29/2015  . Dehydration 07/09/2015  . Lytic bone lesions on xray 06/12/2015  . Encounter for antineoplastic chemotherapy 01/16/2015  . Pain and swelling of left lower extremity 12/15/2014  . Medication management 11/04/2013  . Vitamin D deficiency 11/04/2013  . CKD stage 3 due to type 2 diabetes mellitus ([place]) 11/04/2013  . Hyperlipidemia 02/24/2013  . History of autologous stem cell transplant ([place])   . Multiple myeloma in relapse ([place]) 01/12/2010  . Hypothyroidism 01/12/2010  . GERD 01/12/2010  . Essential hypertension 01/12/2010    Past Surgical History:  Procedure Laterality Date  . CHOLECYSTECTOMY  1980's  . KNEE ARTHROSCOPY Right    "put pin in"  . KNEE ARTHROSCOPY Right    "took pin out and corrected what was wrong"  . PORTACATH PLACEMENT Right 2009  . VAGINAL HYSTERECTOMY  1980's  . VIDEO BRONCHOSCOPY  07/30/2011   Procedure: VIDEO BRONCHOSCOPY WITHOUT FLUORO;  Surgeon: Kathee Delton, MD;  Location: [facility];  Service: Cardiopulmonary;  Laterality: Bilateral;  OB History    No data available       Home Medications    Prior to Admission medications   Medication Sig Start Date End Date Taking? Authorizing Provider  ALPRAZolam Duanne Moron) 0.5 MG tablet TAKE 1/2 TO 1 TABLET 2 TO 3 TIMES DAILY AS NEEDED FOR ANXIETY 11/28/15   Unk Pinto, MD  aspirin 81 MG tablet Take 81 mg by mouth as directed. Mondays, Wednesdays, and Fridays    Historical Provider, MD  citalopram (CELEXA) 40  MG tablet TAKE 1 TABLET (40 MG TOTAL) BY MOUTH DAILY. 12/25/15   Unk Pinto, MD  cyclobenzaprine (FLEXERIL) 10 MG tablet Take 1 tablet (10 mg total) by mouth 3 (three) times daily as needed for muscle spasms. 10/04/15 10/03/16  Courtney Forcucci, [name]  dexamethasone (DECADRON) 4 MG tablet TAKE (10) TABLETS BY MOUTH EVERY FRIDAY Patient taking differently: TAKE (5) TABLETS BY MOUTH EVERY FRIDAY 01/27/16   Curt Bears, MD  diphenoxylate-atropine (LOMOTIL) 2.5-0.025 MG tablet TAKE 2 TABLETS BY MOUTH 4 TIMES A DAY AS NEEDED FOR DIARRHEA 09/21/15   Susanne Borders, NP  Eszopiclone 3 MG TABS TAKE 1 TABLET BY MOUTH AT BEDTIME AS NEEDED FOR SLEEP 12/26/15   Vicie Mutters, [name]  furosemide (LASIX) 40 MG tablet TAKE 1 TABLET BY MOUTH TWICE A DAY FOR FLUID AND SWELLING 01/03/16   Vicie Mutters, [name]  gabapentin (NEURONTIN) 600 MG tablet TAKE 1 TABLET 4 X DAILY AS NEEDED FOR PAIN OR CRAMPS 01/03/16   Loma Sousa Forcucci, [name]  HYDROcodone-acetaminophen (NORCO) 7.5-325 MG tablet Take 1 tablet by mouth every 6 (six) hours as needed for moderate pain. 01/16/16   Curt Bears, MD  KLOR-CON 10 10 MEQ tablet TAKE 1 TABLET BY MOUTH TWICE A DAY Patient taking differently: TAKE 1 TABLET BY MOUTH THREE TIMES DAILY X1 WEEK. --USUALLY ONLY TAKES TWO DAILY. 10/30/15   Unk Pinto, MD  loperamide (IMODIUM) 2 MG capsule Take 2 mg by mouth as needed for diarrhea or loose stools. Reported on 10/02/2015    Historical Provider, MD  Magnesium 500 MG TABS Take 500 mg by mouth 2 (two) times daily.     Historical Provider, MD  mometasone (NASONEX) 50 MCG/ACT nasal spray PLACE 2 SPRAYS INTO THE NOSE DAILY. Patient taking differently: PLACE 2 SPRAYS INTO THE NOSE DAILY AS NEEDED FOR ALLERGIES. 01/08/16   Unk Pinto, MD  montelukast (SINGULAIR) 10 MG tablet TAKE 1 TABLET BY MOUTH EVERY DAY 12/11/15   Unk Pinto, MD  morphine (MS CONTIN) 15 MG 12 hr tablet Take 1 tablet (15 mg total) by mouth every 12 (twelve) hours. 01/16/16    Curt Bears, MD  Multiple Vitamin (MULITIVITAMIN WITH MINERALS) TABS Take 1 tablet by mouth every morning.     Historical Provider, MD  ondansetron (ZOFRAN-ODT) 8 MG disintegrating tablet TAKE 1 TABLET BY MOUTH EVERY 8 HOURS AS NEEDED FOR NAUSEA AND VOMITING 12/26/15   Unk Pinto, MD  pantoprazole (PROTONIX) 40 MG tablet TAKE 1 TABLET (40 MG TOTAL) BY MOUTH 2 (TWO) TIMES DAILY. Patient taking differently: Take 40 mg by mouth daily.  01/04/16   Vicie Mutters, [name]  pomalidomide (POMALYST) 2 MG capsule Take 1 capsule (2 mg total) by mouth daily. Take with water on days 1-21. Repeat every 28 days. 01/29/16   Curt Bears, MD  SYNTHROID 100 MCG tablet Take  Tablet daily on an empty stomach for 30 minutes Patient taking differently: Take 100 mcg by mouth. Take 139mg on an empty stomach once daily on the weekdays and  take 72mg daily on the weekends 12/17/15   WUnk Pinto MD    Family History Family History  Problem Relation Age of Onset  . Lung cancer Brother   . Colon cancer Maternal Uncle   . Breast cancer Maternal Grandmother     Social History Social History  Substance Use Topics  . Smoking status: Former Smoker    Packs/day: 0.25    Years: 6.00    Types: Cigarettes    Quit date: 08/27/1978  . Smokeless tobacco: Never Used  . Alcohol use No     Comment: "quit drinking in the 1980's", previously drank on the weekend     Allergies   Review of patient's allergies indicates no known allergies.   Review of Systems Review of Systems  Constitutional: Negative for fever.  Gastrointestinal: Negative for constipation.  Genitourinary: Negative for dysuria.  All other systems reviewed and are negative.    Physical Exam Updated Vital Signs BP 140/67 (BP Location: Left Arm)   Pulse 84   Temp 98 F (36.7 C) (Oral)   Resp 18   SpO2 97%   Physical Exam  Constitutional: She is oriented to person, place, and time. She appears well-developed and well-nourished. No  distress.  HENT:  Head: Normocephalic.  Eyes: Conjunctivae are normal.  Neck: Neck supple. No tracheal deviation present.  Cardiovascular: Normal rate and regular rhythm.   Pulmonary/Chest: Effort normal. No respiratory distress.  Abdominal: Soft. She exhibits no distension. There is tenderness (mild, diffuse). There is no rebound and no guarding.  Neurological: She is alert and oriented to person, place, and time. No cranial nerve deficit. Coordination normal.  Skin: Skin is warm and dry.  Psychiatric: She has a normal mood and affect.     ED Treatments / Results  Labs (all labs ordered are listed, but only abnormal results are displayed) Labs Reviewed  COMPREHENSIVE METABOLIC PANEL - Abnormal; Notable for the following:       Result Value   Glucose, Bld 137 (*)    Albumin 3.3 (*)    All other components within normal limits  CBC WITH DIFFERENTIAL/PLATELET - Abnormal; Notable for the following:    WBC 3.7 (*)    RBC 2.99 (*)    Hemoglobin 10.1 (*)    HCT 30.8 (*)    MCV 103.0 (*)    All other components within normal limits  MAGNESIUM - Abnormal; Notable for the following:    Magnesium 1.5 (*)    All other components within normal limits  I-STAT CG4 LACTIC ACID, ED - Abnormal; Notable for the following:    Lactic Acid, Venous 2.61 (*)    All other components within normal limits  LIPASE, BLOOD  URINALYSIS, ROUTINE W REFLEX MICROSCOPIC (NOT AT [facility]  I-STAT CG4 LACTIC ACID, ED    EKG  EKG Interpretation None       Radiology Ct Abdomen Pelvis W Contrast  Result Date: 02/03/2016 CLINICAL DATA:  Lower abdominal pain since this morning. History of bone cancer, on chemotherapy. EXAM: CT ABDOMEN AND PELVIS WITH CONTRAST TECHNIQUE: Multidetector CT imaging of the abdomen and pelvis was performed using the standard protocol following bolus administration of intravenous contrast. CONTRAST:  1037mISOVUE-300 IOPAMIDOL (ISOVUE-300) INJECTION 61% COMPARISON:  CT abdomen dated  01/26/2013, MRI abdomen dated 10/27/2014 and CT chest dated 12/18/2013. FINDINGS: Lower chest:  No acute findings. Hepatobiliary: Liver is low in density suggesting fatty infiltration. No focal mass or lesion appreciated in the liver. Patient is status post cholecystectomy. Pancreas:  No mass, inflammatory changes, or other significant abnormality. Spleen: Normal Adrenals/Urinary Tract: Stable small hypodense mass within the left adrenal gland, measuring approximately 10 mm greatest dimension, the stability since 2014 indicating benignity (most likely benign adrenal adenoma). Right adrenal gland appears normal. Kidneys appear normal without mass, stone or hydronephrosis. No ureteral or bladder calculi. Bladder appears normal. Stomach/Bowel: Bowel is normal in caliber. No bowel wall thickening or evidence of bowel wall inflammation. Appendix is normal. Stomach appears normal. Vascular/Lymphatic: Abdominal aorta is normal in caliber. No enlarged lymph nodes seen in the abdomen or pelvis. Reproductive: Status post hysterectomy. No mass or fluid collection within either adnexal region. Other: No free fluid or abscess collection. No free intraperitoneal air. Musculoskeletal: Stable appearing lytic lesion within the left ischium, again measuring approximately 2.4 cm greatest dimension. Smaller lytic lesions within the iliac bones appear stable. Stable compression fracture deformities of the L2 and L4 vertebral bodies, described as pathologic on previous lumbar spine MRI. No new or worsened osseous lesions. Superficial soft tissues are unremarkable. IMPRESSION: 1. No acute intra-abdominal or intrapelvic abnormality. No bowel obstruction or evidence of bowel wall inflammation. No soft tissue mass. No free fluid or abscess. No evidence of acute solid organ abnormality. Appendix is normal. 2. Fatty infiltration of the liver. 3. Stable lytic lesions within the pelvis and spine, compatible with patient's history of multiple  myeloma. No new osseous lesions. 4. Additional chronic/incidental findings detailed above. Electronically Signed   By: Franki Cabot M.D.   On: 02/03/2016 19:58    Procedures Procedures (including critical care time)  Medications Ordered in ED Medications  sodium chloride 0.9 % bolus 1,000 mL (0 mLs Intravenous Stopped 02/03/16 1748)  prochlorperazine (COMPAZINE) injection 10 mg (10 mg Intravenous Given 02/03/16 1627)  diphenhydrAMINE (BENADRYL) injection 25 mg (25 mg Intravenous Given 02/03/16 1627)  acetaminophen (TYLENOL) tablet 1,000 mg (1,000 mg Oral Given 02/03/16 1627)  magnesium sulfate IVPB 2 g 50 mL (0 g Intravenous Stopped 02/03/16 1806)  sodium chloride 0.9 % bolus 1,000 mL (0 mLs Intravenous Stopped 02/03/16 1919)  iopamidol (ISOVUE-300) 61 % injection 100 mL (100 mLs Intravenous Contrast Given 02/03/16 1928)  heparin lock flush 100 unit/mL (500 Units Intracatheter Given 02/03/16 2210)     Initial Impression / Assessment and Plan / ED Course  I have reviewed the triage vital signs and the nursing notes.  Pertinent labs & imaging results that were available during my care of the patient were reviewed by me and considered in my medical decision making (see chart for details).  Clinical Course    65 y.o. female presents with abdominal pain and gaseousness as well as ongoing headache. No neuro deficits. Given supportive care measures and migraine cocktail. CT ordered to evaluate for progression of known recurrent myeloma. Lesions appear stable to prior. Plan to follow up with PCP as needed and return precautions discussed for worsening or new concerning symptoms.   Final Clinical Impressions(s) / ED Diagnoses   Final diagnoses:  Abdominal pain, unspecified abdominal location    New Prescriptions New Prescriptions   No medications on file     Leo Grosser, MD 02/05/16 [phone]

## 2016-02-03 NOTE — ED Notes (Signed)
Patient transported to CT 

## 2016-02-05 ENCOUNTER — Telehealth: Payer: Self-pay | Admitting: Medical Oncology

## 2016-02-05 NOTE — Telephone Encounter (Signed)
Pt reviewed her past ED visit for abd pain.  She said she is now taking  mag Tid, kdur 2 tabs Tid. She feels there are too many doctors telling her different things to do for her potassium.   She is going to see Melford Aase next week and confirmed appt with [facility]. She goes to see Dr Loma Sousa in October . She would prefer to have Dr Julien Nordmann tell her what Dr Loma Sousa thinks instead of Dr Loma Sousa telling her. I told pt to tell Dr Pervis Hocking too.

## 2016-02-13 ENCOUNTER — Ambulatory Visit: Payer: Self-pay

## 2016-02-15 ENCOUNTER — Telehealth: Payer: Self-pay | Admitting: *Deleted

## 2016-02-16 ENCOUNTER — Other Ambulatory Visit: Payer: Self-pay | Admitting: *Deleted

## 2016-02-16 DIAGNOSIS — C9000 Multiple myeloma not having achieved remission: Secondary | ICD-10-CM

## 2016-02-16 DIAGNOSIS — Z5111 Encounter for antineoplastic chemotherapy: Secondary | ICD-10-CM

## 2016-02-16 MED ORDER — HYDROCODONE-ACETAMINOPHEN 7.5-325 MG PO TABS: 1.0000 | ORAL_TABLET | Freq: Four times a day (QID) | ORAL | 0 refills | Status: DC | PRN

## 2016-02-16 MED ORDER — MORPHINE SULFATE ER 15 MG PO TBCR: 15.0000 mg | EXTENDED_RELEASE_TABLET | Freq: Two times a day (BID) | ORAL | 0 refills | Status: DC

## 2016-02-16 NOTE — Telephone Encounter (Signed)
Pt called requesting pain medication refill, MD out of office, reviewed with Dr. Jana Hakim. Rx ready for pick up.

## 2016-02-16 NOTE — Telephone Encounter (Signed)
rx ready for pickup. pt notified and she will pick them up today. I told her after 430 p to go to the infusion room.

## 2016-02-17 ENCOUNTER — Other Ambulatory Visit: Payer: Self-pay | Admitting: Internal Medicine

## 2016-02-18 ENCOUNTER — Encounter: Payer: Self-pay | Admitting: Oncology

## 2016-02-19 ENCOUNTER — Telehealth: Payer: Self-pay | Admitting: Internal Medicine

## 2016-02-19 ENCOUNTER — Encounter: Payer: Self-pay | Admitting: Internal Medicine

## 2016-02-19 ENCOUNTER — Ambulatory Visit (HOSPITAL_BASED_OUTPATIENT_CLINIC_OR_DEPARTMENT_OTHER): Payer: Medicare Other | Admitting: Internal Medicine

## 2016-02-19 ENCOUNTER — Other Ambulatory Visit (HOSPITAL_BASED_OUTPATIENT_CLINIC_OR_DEPARTMENT_OTHER): Payer: Medicare Other

## 2016-02-19 VITALS — BP 110/56 | HR 72 | Temp 98.4°F | Resp 18 | Ht 64.75 in | Wt 207.7 lb

## 2016-02-19 DIAGNOSIS — M549 Dorsalgia, unspecified: Secondary | ICD-10-CM | POA: Diagnosis not present

## 2016-02-19 DIAGNOSIS — E876 Hypokalemia: Secondary | ICD-10-CM

## 2016-02-19 DIAGNOSIS — E1122 Type 2 diabetes mellitus with diabetic chronic kidney disease: Secondary | ICD-10-CM

## 2016-02-19 DIAGNOSIS — C9002 Multiple myeloma in relapse: Secondary | ICD-10-CM | POA: Diagnosis not present

## 2016-02-19 DIAGNOSIS — Z5111 Encounter for antineoplastic chemotherapy: Secondary | ICD-10-CM

## 2016-02-19 DIAGNOSIS — N183 Chronic kidney disease, stage 3 (moderate): Secondary | ICD-10-CM

## 2016-02-19 LAB — COMPREHENSIVE METABOLIC PANEL
ALT: 17 U/L (ref 0–55)
AST: 14 U/L (ref 5–34)
Albumin: 3.1 g/dL — ABNORMAL LOW (ref 3.5–5.0)
Alkaline Phosphatase: 64 U/L (ref 40–150)
Anion Gap: 6 mEq/L (ref 3–11)
BILIRUBIN TOTAL: 0.57 mg/dL (ref 0.20–1.20)
BUN: 13.7 mg/dL (ref 7.0–26.0)
CO2: 24 meq/L (ref 22–29)
Calcium: 9.3 mg/dL (ref 8.4–10.4)
Chloride: 110 mEq/L — ABNORMAL HIGH (ref 98–109)
Creatinine: 1 mg/dL (ref 0.6–1.1)
EGFR: 66 mL/min/{1.73_m2} — AB (ref >90)
GLUCOSE: 77 mg/dL (ref 70–140)
Potassium: 4 mEq/L (ref 3.5–5.1)
SODIUM: 140 meq/L (ref 136–145)
TOTAL PROTEIN: 6.9 g/dL (ref 6.4–8.3)

## 2016-02-19 LAB — CBC WITH DIFFERENTIAL/PLATELET
BASO%: 0.6 % (ref 0.0–2.0)
Basophils Absolute: 0 10*3/uL (ref 0.0–0.1)
EOS ABS: 0.1 10*3/uL (ref 0.0–0.5)
EOS%: 2.5 % (ref 0.0–7.0)
HCT: 32 % — ABNORMAL LOW (ref 34.8–46.6)
HGB: 10.1 g/dL — ABNORMAL LOW (ref 11.6–15.9)
LYMPH%: 55 % — AB (ref 14.0–49.7)
MCH: 32.7 pg (ref 25.1–34.0)
MCHC: 31.6 g/dL (ref 31.5–36.0)
MCV: 103.6 fL — AB (ref 79.5–101.0)
MONO#: 0.5 10*3/uL (ref 0.1–0.9)
MONO%: 15 % — AB (ref 0.0–14.0)
NEUT%: 26.9 % — ABNORMAL LOW (ref 38.4–76.8)
NEUTROS ABS: 0.9 10*3/uL — AB (ref 1.5–6.5)
Platelets: 130 10*3/uL — ABNORMAL LOW (ref 145–400)
RBC: 3.09 10*6/uL — AB (ref 3.70–5.45)
RDW: 15.1 % — ABNORMAL HIGH (ref 11.2–14.5)
WBC: 3.2 10*3/uL — AB (ref 3.9–10.3)
lymph#: 1.8 10*3/uL (ref 0.9–3.3)

## 2016-02-19 LAB — MAGNESIUM: Magnesium: 2 mg/dl (ref 1.5–2.5)

## 2016-02-19 LAB — LACTATE DEHYDROGENASE: LDH: 110 U/L — ABNORMAL LOW (ref 125–245)

## 2016-02-19 NOTE — Progress Notes (Signed)
[place] [phone]   Fax:([phone]) [place], [place] [address]  DIAGNOSIS: Recurrent multiple myeloma initially diagnosed in October 2008.   PRIOR THERAPY:  1. Status post palliative radiotherapy to the lumbar spine between L3 and L5. The patient received a total dose of 3000 cGy in 10 fractions under the care of Dr. Lisbeth Renshaw between May 05, 2007 through May 18, 2007. 2. Status post 5 cycles of systemic chemotherapy with Revlimid and low-dose Decadron with good response to this treatment. 3. Status post autologous peripheral blood stem cell transplant at [facility] on Nov 20, 2007 under the care of Dr. Valarie Merino. 4. Status post treatment for disease recurrence with Velcade, Doxil and Decadron. Last dose given Nov 09, 2009. Discontinued secondary to intolerance but the patient had a good response to treatment at that time. 5. Status post palliative radiotherapy to the T2-T6 thoracic vertebrae completed 03/21/2011 under the care of Dr. Lisbeth Renshaw. 6. Systemic chemotherapy with Velcade 1.3 mg per meter squared given on days 1, 4, 8 and 11, and Doxil at 30 mg per meter squared given on day 4 in addition to Decadron status post 4 cycles, discontinued secondary to intolerance. 7. Systemic therapy with Velcade 1.3 mg/M2 subcutaneously in addition to Decadron 40 mg by mouth on a weekly basis, status post 20 cycles. The patient had good response with this treatment but it is discontinue today secondary to worsening peripheral neuropathy. 8. Palliative radiotherapy to the skull lesion as well as the left hip area under the care of Dr. Lisbeth Renshaw. 9. Systemic chemotherapy with Carfilzomib 20 mg/M2 on days 1, 2,  8, 9, 13 and 16 every 4 weeks in addition to weekly Decadron 40 mg by mouth. First dose on 04/19/2013. Status post 4 cycles, discontinued recently secondary to cardiac  dysfunction.  CURRENT THERAPY:  1. Pomalyst 3 mg by mouth daily for 21 days every 4 weeks in addition to dexamethasone 40 mg on a weekly basis. First dose started 12/02/2013. Status post 22 cycles. She will start cycle #28 on 02/21/2016. Her dose of Pomalyst will be reduced to 2 mg by mouth daily for 21 days every 4 weeks starting from cycle 23.  2. Zometa 4 mg IV given every 3 months.   INTERVAL HISTORY: [name] 65 y.o. female returns to the clinic today for follow up visit accompanied by her daughter. The patient is feeling fine today was no specific complaints except for intermittent headache. She had CT scan of the head and months ago that showed no progressive abnormalities. She denied having any significant chest pain, shortness breath, cough or hemoptysis. She has no current nausea, vomiting, diarrhea or constipation. She has persistent low back pain and she is currently on pain medication with MS Contin and Norco. She denied having any significant weight loss or night sweats. She is tolerating her treatment with Pomalyst fairly well.  MEDICAL HISTORY: Past Medical History:  Diagnosis Date  . Compression fracture 04/08/2007   pathologic compression fracture  . Constipation    takes oxycontin,vicodin  . Family history of anesthesia complication    "daughter gets PONV too"  . FHx: chemotherapy    s/p 5 cycle revlimid/low dose decadron,s/p velcade,doxil,decadron,  . GERD (gastroesophageal reflux disease)   . History of autologous stem cell transplant ([place]) 11/20/2007   [facility], Dr Valarie Merino  . Hx of radiation therapy 05/05/07-05/18/07,& 03/05/11-03/21/11-   l3&l5 in  2008, t2-t6 03/2011  . Hx of radiation therapy 05/05/2007 to 05/18/2007   palliative, L3-5  . Hx of radiation therapy 03/05/2011 to 03/21/2011   palliative T2-T6, c-spine  . Hypercholesterolemia   . Hypothyroidism   . Insomnia    associated with steroids  . Metastasis to bone ([place])   . Multiple myeloma ([place]) dx'd 2009   . Pneumonia    "several times"  . PONV (postoperative nausea and vomiting)   . Stroke [facility]) 2014   denies residual on 01/27/2014    ALLERGIES:  has No Known Allergies.  MEDICATIONS:  Current Outpatient Prescriptions  Medication Sig Dispense Refill  . ALPRAZolam (XANAX) 0.5 MG tablet TAKE 1/2 TO 1 TABLET 2 TO 3 TIMES DAILY AS NEEDED FOR ANXIETY 270 tablet 1  . aspirin 81 MG tablet Take 81 mg by mouth as directed. Mondays, Wednesdays, and Fridays    . citalopram (CELEXA) 40 MG tablet TAKE 1 TABLET (40 MG TOTAL) BY MOUTH DAILY. 90 tablet 2  . cyclobenzaprine (FLEXERIL) 10 MG tablet Take 1 tablet (10 mg total) by mouth 3 (three) times daily as needed for muscle spasms. 90 tablet 3  . dexamethasone (DECADRON) 4 MG tablet TAKE (10) TABLETS BY MOUTH EVERY FRIDAY (Patient taking differently: TAKE (5) TABLETS BY MOUTH EVERY FRIDAY) 40 tablet 1  . diphenoxylate-atropine (LOMOTIL) 2.5-0.025 MG tablet TAKE 2 TABLETS BY MOUTH 4 TIMES A DAY AS NEEDED FOR DIARRHEA 30 tablet 1  . Eszopiclone 3 MG TABS TAKE 1 TABLET BY MOUTH AT BEDTIME AS NEEDED FOR SLEEP 30 tablet 2  . furosemide (LASIX) 40 MG tablet TAKE 1 TABLET BY MOUTH TWICE A DAY FOR FLUID AND SWELLING 90 tablet 1  . gabapentin (NEURONTIN) 600 MG tablet TAKE 1 TABLET 4 X DAILY AS NEEDED FOR PAIN OR CRAMPS 120 tablet 4  . HYDROcodone-acetaminophen (NORCO) 7.5-325 MG tablet Take 1 tablet by mouth every 6 (six) hours as needed for moderate pain. 30 tablet 0  . KLOR-CON 10 10 MEQ tablet TAKE 1 TABLET BY MOUTH TWICE A DAY (Patient taking differently: TAKE 1 TABLET BY MOUTH THREE TIMES DAILY X1 WEEK. --USUALLY ONLY TAKES TWO DAILY.) 180 tablet 1  . loperamide (IMODIUM) 2 MG capsule Take 2 mg by mouth as needed for diarrhea or loose stools. Reported on 10/02/2015    . Magnesium 500 MG TABS Take 500 mg by mouth 2 (two) times daily.     . mometasone (NASONEX) 50 MCG/ACT nasal spray PLACE 2 SPRAYS INTO THE NOSE DAILY. (Patient taking differently: PLACE 2 SPRAYS  INTO THE NOSE DAILY AS NEEDED FOR ALLERGIES.) 51 g 1  . montelukast (SINGULAIR) 10 MG tablet TAKE 1 TABLET BY MOUTH EVERY DAY 30 tablet 2  . morphine (MS CONTIN) 15 MG 12 hr tablet Take 1 tablet (15 mg total) by mouth every 12 (twelve) hours. 60 tablet 0  . Multiple Vitamin (MULITIVITAMIN WITH MINERALS) TABS Take 1 tablet by mouth every morning.     . ondansetron (ZOFRAN-ODT) 8 MG disintegrating tablet TAKE 1 TABLET BY MOUTH EVERY 8 HOURS AS NEEDED FOR NAUSEA AND VOMITING 30 tablet 1  . pantoprazole (PROTONIX) 40 MG tablet TAKE 1 TABLET (40 MG TOTAL) BY MOUTH 2 (TWO) TIMES DAILY. (Patient taking differently: Take 40 mg by mouth daily. ) 90 tablet 1  . pomalidomide (POMALYST) 2 MG capsule Take 1 capsule (2 mg total) by mouth daily. Take with water on days 1-21. Repeat every 28 days. 21 capsule 0  . SYNTHROID 100 MCG tablet TAKE  1 TABLET BY MOUTH DAILY 90 tablet 1   No current facility-administered medications for this visit.     SURGICAL HISTORY:  Past Surgical History:  Procedure Laterality Date  . CHOLECYSTECTOMY  1980's  . KNEE ARTHROSCOPY Right    "put pin in"  . KNEE ARTHROSCOPY Right    "took pin out and corrected what was wrong"  . PORTACATH PLACEMENT Right 2009  . VAGINAL HYSTERECTOMY  1980's  . VIDEO BRONCHOSCOPY  07/30/2011   Procedure: VIDEO BRONCHOSCOPY WITHOUT FLUORO;  Surgeon: Kathee Delton, MD;  Location: [facility];  Service: Cardiopulmonary;  Laterality: Bilateral;    REVIEW OF SYSTEMS:  A comprehensive review of systems was negative except for: Constitutional: positive for fatigue Musculoskeletal: positive for back pain   PHYSICAL EXAMINATION: General appearance: alert, cooperative, fatigued and no distress Head: Normocephalic, without obvious abnormality, atraumatic Neck: no adenopathy, no JVD, supple, symmetrical, trachea midline and thyroid not enlarged, symmetric, no tenderness/mass/nodules Lymph nodes: Cervical, supraclavicular, and axillary nodes  normal. Resp: clear to auscultation bilaterally Back: symmetric, no curvature. ROM normal. No CVA tenderness. Cardio: regular rate and rhythm, S1, S2 normal, no murmur, click, rub or gallop GI: soft, non-tender; bowel sounds normal; no masses,  no organomegaly Extremities: edema 1+ edema of left lower extremity. Neurologic: Alert and oriented X 3, normal strength and tone. Normal symmetric reflexes. Normal coordination and gait  ECOG PERFORMANCE STATUS: 1 - Symptomatic but completely ambulatory  Blood pressure (!) 110/56, pulse 72, temperature 98.4 F (36.9 C), temperature source Oral, resp. rate 18, height 5' 4.75" (1.645 m), weight 207 lb 11.2 oz (94.2 kg), SpO2 100 %.  LABORATORY DATA: Lab Results  Component Value Date   WBC 3.2 (L) 02/19/2016   HGB 10.1 (L) 02/19/2016   HCT 32.0 (L) 02/19/2016   MCV 103.6 (H) 02/19/2016   PLT 130 (L) 02/19/2016      Chemistry      Component Value Date/Time   NA 139 02/03/2016 1614   NA 141 01/29/2016 0915   K 3.5 02/03/2016 1614   K 3.2 (L) 01/29/2016 0915   CL 111 02/03/2016 1614   CL 105 12/17/2012 1015   CO2 23 02/03/2016 1614   CO2 27 01/29/2016 0915   BUN 10 02/03/2016 1614   BUN 7.3 01/29/2016 0915   CREATININE 0.75 02/03/2016 1614   CREATININE 0.92 02/02/2016 1231   CREATININE 0.9 01/29/2016 0915      Component Value Date/Time   CALCIUM 9.1 02/03/2016 1614   CALCIUM 8.9 01/29/2016 0915   ALKPHOS 55 02/03/2016 1614   ALKPHOS 54 01/29/2016 0915   AST 31 02/03/2016 1614   AST 25 01/29/2016 0915   ALT 19 02/03/2016 1614   ALT 17 01/29/2016 0915   BILITOT 0.9 02/03/2016 1614   BILITOT 0.90 01/29/2016 0915     Myeloma panel Performed 11/30/2015 showed beta 2 microglobulin 2.7, IgG 1136, IgA 105 and IgM 12. Free kappa light chain 26.97, free lambda light chain 137.72 and a kappa/lambda ratio 0.20  RADIOGRAPHIC STUDIES: Ct Abdomen Pelvis W Contrast  Result Date: 02/03/2016 CLINICAL DATA:  Lower abdominal pain since this  morning. History of bone cancer, on chemotherapy. EXAM: CT ABDOMEN AND PELVIS WITH CONTRAST TECHNIQUE: Multidetector CT imaging of the abdomen and pelvis was performed using the standard protocol following bolus administration of intravenous contrast. CONTRAST:  162m ISOVUE-300 IOPAMIDOL (ISOVUE-300) INJECTION 61% COMPARISON:  CT abdomen dated 01/26/2013, MRI abdomen dated 10/27/2014 and CT chest dated 12/18/2013. FINDINGS: Lower chest:  No acute findings.  Hepatobiliary: Liver is low in density suggesting fatty infiltration. No focal mass or lesion appreciated in the liver. Patient is status post cholecystectomy. Pancreas: No mass, inflammatory changes, or other significant abnormality. Spleen: Normal Adrenals/Urinary Tract: Stable small hypodense mass within the left adrenal gland, measuring approximately 10 mm greatest dimension, the stability since 2014 indicating benignity (most likely benign adrenal adenoma). Right adrenal gland appears normal. Kidneys appear normal without mass, stone or hydronephrosis. No ureteral or bladder calculi. Bladder appears normal. Stomach/Bowel: Bowel is normal in caliber. No bowel wall thickening or evidence of bowel wall inflammation. Appendix is normal. Stomach appears normal. Vascular/Lymphatic: Abdominal aorta is normal in caliber. No enlarged lymph nodes seen in the abdomen or pelvis. Reproductive: Status post hysterectomy. No mass or fluid collection within either adnexal region. Other: No free fluid or abscess collection. No free intraperitoneal air. Musculoskeletal: Stable appearing lytic lesion within the left ischium, again measuring approximately 2.4 cm greatest dimension. Smaller lytic lesions within the iliac bones appear stable. Stable compression fracture deformities of the L2 and L4 vertebral bodies, described as pathologic on previous lumbar spine MRI. No new or worsened osseous lesions. Superficial soft tissues are unremarkable. IMPRESSION: 1. No acute  intra-abdominal or intrapelvic abnormality. No bowel obstruction or evidence of bowel wall inflammation. No soft tissue mass. No free fluid or abscess. No evidence of acute solid organ abnormality. Appendix is normal. 2. Fatty infiltration of the liver. 3. Stable lytic lesions within the pelvis and spine, compatible with patient's history of multiple myeloma. No new osseous lesions. 4. Additional chronic/incidental findings detailed above. Electronically Signed   By: Franki Cabot M.D.   On: 02/03/2016 19:58   ASSESSMENT AND PLAN:  This is a very pleasant 65 years old [place] American female with recurrent multiple myeloma recently completed a course of treatment with Velcade and Decadron with improvement in her disease but this was discontinued secondary to peripheral neuropathy. The patient tolerated her treatment with Carfilzomib and Decadron fairly well except for the recent shortness breath and cough which was felt to be secondary to congestive heart failure from her treatment with Carfilzomib. This treatment was discontinued. She was started on treatment with Pomalyst and Decadron status post 27 cycles. She tolerated the last cycle of her treatment well. I recommended for the patient to continue her treatment with Pomalyst at reduced dose of 2 mg.  I will see her back for follow-up visit in one month's for reevaluation with repeat myeloma panel. For the back pain she will continue on MS Contin and Norco. For the hypokalemia, I advised the patient to increase her dose of potassium chloride 40 mEq in a.m. and 20 mEq in p.m. I would see the patient back for follow-up visit in 4 weeks for reevaluation with repeat myeloma panel.  She was advised to call immediately if she has any concerning symptoms in the interval. The patient voices understanding of current disease status and treatment options and is in agreement with the current care plan.  All questions were answered. The patient knows to call the  clinic with any problems, questions or concerns. We can certainly see the patient much sooner if necessary.  Disclaimer: This note was dictated with voice recognition software. Similar sounding words can inadvertently be transcribed and may be missed upon review.

## 2016-02-19 NOTE — Telephone Encounter (Signed)
GAVE PATIENT AVS REPORT AND APPOINTMENTS FOR September. PER PATIENT  RESCHEDULED 9/1 APPOINTMENTS FOR LAB/ZOMETA. LAB WILL BE DRAWN 9/11 PER NEW ORDERS GIVEN TODAY (LOS) AND PATIENT WILL HAVE ZOMETA AFTER 9/18 F/U.

## 2016-02-21 ENCOUNTER — Other Ambulatory Visit: Payer: Self-pay

## 2016-02-21 MED ORDER — BLOOD GLUCOSE METER KIT: PACK | 0 refills | Status: DC

## 2016-02-23 ENCOUNTER — Other Ambulatory Visit: Payer: Self-pay | Admitting: Internal Medicine

## 2016-02-26 ENCOUNTER — Ambulatory Visit: Payer: Medicare Other | Admitting: *Deleted

## 2016-02-26 ENCOUNTER — Ambulatory Visit: Payer: Self-pay

## 2016-02-27 NOTE — Progress Notes (Signed)
This encounter was created in error - please disregard.

## 2016-02-27 NOTE — Addendum Note (Signed)
Addended by: [place] Want A on: 02/27/2016 08:08 AM   Modules accepted: SmartSet

## 2016-03-01 ENCOUNTER — Ambulatory Visit: Payer: Self-pay

## 2016-03-01 ENCOUNTER — Other Ambulatory Visit: Payer: Self-pay

## 2016-03-03 ENCOUNTER — Other Ambulatory Visit: Payer: Self-pay | Admitting: Internal Medicine

## 2016-03-11 ENCOUNTER — Other Ambulatory Visit (HOSPITAL_BASED_OUTPATIENT_CLINIC_OR_DEPARTMENT_OTHER): Payer: Medicare Other

## 2016-03-11 DIAGNOSIS — Z5111 Encounter for antineoplastic chemotherapy: Secondary | ICD-10-CM | POA: Diagnosis not present

## 2016-03-11 DIAGNOSIS — C9002 Multiple myeloma in relapse: Secondary | ICD-10-CM | POA: Diagnosis not present

## 2016-03-11 DIAGNOSIS — N183 Chronic kidney disease, stage 3 (moderate): Secondary | ICD-10-CM | POA: Diagnosis not present

## 2016-03-11 DIAGNOSIS — E1122 Type 2 diabetes mellitus with diabetic chronic kidney disease: Secondary | ICD-10-CM

## 2016-03-11 LAB — COMPREHENSIVE METABOLIC PANEL
ALBUMIN: 3.2 g/dL — AB (ref 3.5–5.0)
ALK PHOS: 75 U/L (ref 40–150)
ALT: 19 U/L (ref 0–55)
AST: 13 U/L (ref 5–34)
Anion Gap: 8 mEq/L (ref 3–11)
BUN: 8.8 mg/dL (ref 7.0–26.0)
CALCIUM: 8.7 mg/dL (ref 8.4–10.4)
CO2: 25 mEq/L (ref 22–29)
CREATININE: 0.9 mg/dL (ref 0.6–1.1)
Chloride: 106 mEq/L (ref 98–109)
EGFR: 75 mL/min/{1.73_m2} — ABNORMAL LOW (ref >90)
GLUCOSE: 95 mg/dL (ref 70–140)
POTASSIUM: 3.8 meq/L (ref 3.5–5.1)
SODIUM: 140 meq/L (ref 136–145)
TOTAL PROTEIN: 7.5 g/dL (ref 6.4–8.3)
Total Bilirubin: 0.7 mg/dL (ref 0.20–1.20)

## 2016-03-11 LAB — CBC WITH DIFFERENTIAL/PLATELET
BASO%: 0.8 % (ref 0.0–2.0)
Basophils Absolute: 0 10*3/uL (ref 0.0–0.1)
EOS%: 10.6 % — AB (ref 0.0–7.0)
Eosinophils Absolute: 0.3 10*3/uL (ref 0.0–0.5)
HEMATOCRIT: 32.9 % — AB (ref 34.8–46.6)
HEMOGLOBIN: 10.7 g/dL — AB (ref 11.6–15.9)
LYMPH#: 1 10*3/uL (ref 0.9–3.3)
LYMPH%: 39.4 % (ref 14.0–49.7)
MCH: 33 pg (ref 25.1–34.0)
MCHC: 32.5 g/dL (ref 31.5–36.0)
MCV: 101.5 fL — ABNORMAL HIGH (ref 79.5–101.0)
MONO#: 0.4 10*3/uL (ref 0.1–0.9)
MONO%: 14.6 % — AB (ref 0.0–14.0)
NEUT%: 34.6 % — ABNORMAL LOW (ref 38.4–76.8)
NEUTROS ABS: 0.9 10*3/uL — AB (ref 1.5–6.5)
Platelets: 132 10*3/uL — ABNORMAL LOW (ref 145–400)
RBC: 3.24 10*6/uL — ABNORMAL LOW (ref 3.70–5.45)
RDW: 15.4 % — AB (ref 11.2–14.5)
WBC: 2.5 10*3/uL — AB (ref 3.9–10.3)

## 2016-03-11 LAB — LACTATE DEHYDROGENASE: LDH: 133 U/L (ref 125–245)

## 2016-03-12 LAB — KAPPA/LAMBDA LIGHT CHAINS
IG KAPPA FREE LIGHT CHAIN: 19.5 mg/L — AB (ref 3.3–19.4)
Ig Lambda Free Light Chain: 170.2 mg/L — ABNORMAL HIGH (ref 5.7–26.3)
KAPPA/LAMBDA FLC RATIO: 0.11 — AB (ref 0.26–1.65)

## 2016-03-12 LAB — BETA 2 MICROGLOBULIN, SERUM: Beta-2: 2.3 mg/L (ref 0.6–2.4)

## 2016-03-12 LAB — IGG, IGA, IGM
IGM (IMMUNOGLOBIN M), SRM: 10 mg/dL — AB (ref 26–217)
IgA, Qn, Serum: 93 mg/dL (ref 87–352)
IgG, Qn, Serum: 2105 mg/dL — ABNORMAL HIGH (ref 700–1600)

## 2016-03-13 ENCOUNTER — Encounter: Payer: Self-pay | Admitting: Internal Medicine

## 2016-03-13 ENCOUNTER — Other Ambulatory Visit: Payer: Self-pay | Admitting: Medical Oncology

## 2016-03-13 DIAGNOSIS — Z79899 Other long term (current) drug therapy: Secondary | ICD-10-CM | POA: Diagnosis not present

## 2016-03-13 DIAGNOSIS — Z5111 Encounter for antineoplastic chemotherapy: Secondary | ICD-10-CM

## 2016-03-13 DIAGNOSIS — G893 Neoplasm related pain (acute) (chronic): Secondary | ICD-10-CM | POA: Diagnosis not present

## 2016-03-13 DIAGNOSIS — C9000 Multiple myeloma not having achieved remission: Secondary | ICD-10-CM | POA: Diagnosis not present

## 2016-03-13 DIAGNOSIS — R5383 Other fatigue: Secondary | ICD-10-CM | POA: Diagnosis not present

## 2016-03-13 DIAGNOSIS — E079 Disorder of thyroid, unspecified: Secondary | ICD-10-CM | POA: Diagnosis not present

## 2016-03-13 DIAGNOSIS — K219 Gastro-esophageal reflux disease without esophagitis: Secondary | ICD-10-CM | POA: Diagnosis not present

## 2016-03-13 DIAGNOSIS — G629 Polyneuropathy, unspecified: Secondary | ICD-10-CM | POA: Diagnosis not present

## 2016-03-13 DIAGNOSIS — I10 Essential (primary) hypertension: Secondary | ICD-10-CM | POA: Diagnosis not present

## 2016-03-13 DIAGNOSIS — Z7982 Long term (current) use of aspirin: Secondary | ICD-10-CM | POA: Diagnosis not present

## 2016-03-13 DIAGNOSIS — B370 Candidal stomatitis: Secondary | ICD-10-CM | POA: Diagnosis not present

## 2016-03-13 DIAGNOSIS — M898X9 Other specified disorders of bone, unspecified site: Secondary | ICD-10-CM | POA: Diagnosis not present

## 2016-03-13 MED ORDER — MORPHINE SULFATE ER 15 MG PO TBCR: 15.0000 mg | EXTENDED_RELEASE_TABLET | Freq: Two times a day (BID) | ORAL | 0 refills | Status: DC

## 2016-03-13 MED ORDER — HYDROCODONE-ACETAMINOPHEN 7.5-325 MG PO TABS: 1.0000 | ORAL_TABLET | Freq: Four times a day (QID) | ORAL | 0 refills | Status: DC | PRN

## 2016-03-13 NOTE — Telephone Encounter (Signed)
Refills requested.

## 2016-03-14 ENCOUNTER — Encounter: Payer: Self-pay | Admitting: Internal Medicine

## 2016-03-14 ENCOUNTER — Other Ambulatory Visit: Payer: Self-pay | Admitting: Medical Oncology

## 2016-03-14 DIAGNOSIS — C9002 Multiple myeloma in relapse: Secondary | ICD-10-CM

## 2016-03-14 MED ORDER — POMALIDOMIDE 2 MG PO CAPS: 2.0000 mg | ORAL_CAPSULE | Freq: Every day | ORAL | 0 refills | Status: DC

## 2016-03-18 ENCOUNTER — Telehealth: Payer: Self-pay | Admitting: *Deleted

## 2016-03-18 ENCOUNTER — Encounter: Payer: Self-pay | Admitting: Internal Medicine

## 2016-03-18 ENCOUNTER — Telehealth: Payer: Self-pay | Admitting: Internal Medicine

## 2016-03-18 ENCOUNTER — Ambulatory Visit (HOSPITAL_BASED_OUTPATIENT_CLINIC_OR_DEPARTMENT_OTHER): Payer: Medicare Other

## 2016-03-18 ENCOUNTER — Ambulatory Visit (HOSPITAL_BASED_OUTPATIENT_CLINIC_OR_DEPARTMENT_OTHER): Payer: Medicare Other | Admitting: Internal Medicine

## 2016-03-18 ENCOUNTER — Telehealth: Payer: Self-pay

## 2016-03-18 VITALS — BP 116/53 | HR 81 | Temp 98.0°F | Resp 18 | Ht 64.75 in | Wt 210.5 lb

## 2016-03-18 DIAGNOSIS — C9000 Multiple myeloma not having achieved remission: Secondary | ICD-10-CM

## 2016-03-18 DIAGNOSIS — Z5111 Encounter for antineoplastic chemotherapy: Secondary | ICD-10-CM

## 2016-03-18 DIAGNOSIS — C9002 Multiple myeloma in relapse: Secondary | ICD-10-CM

## 2016-03-18 MED ORDER — HEPARIN SOD (PORK) LOCK FLUSH 100 UNIT/ML IV SOLN
250.0000 [IU] | Freq: Once | INTRAVENOUS | Status: AC | PRN
  Administered 2016-03-18: 250 [IU]
  Filled 2016-03-18: qty 5

## 2016-03-18 MED ORDER — ZOLEDRONIC ACID 4 MG/100ML IV SOLN
4.0000 mg | Freq: Once | INTRAVENOUS | Status: AC
  Administered 2016-03-18: 4 mg via INTRAVENOUS
  Filled 2016-03-18: qty 100

## 2016-03-18 MED ORDER — SODIUM CHLORIDE 0.9 % IJ SOLN
3.0000 mL | Freq: Once | INTRAMUSCULAR | Status: AC | PRN
  Administered 2016-03-18: 3 mL via INTRAVENOUS
  Filled 2016-03-18: qty 10

## 2016-03-18 NOTE — Progress Notes (Signed)
    [name]   [phone]  OFFICE PROGRESS NOTE  MCKEOWN,WILLIAM DAVID, MD [address] [place] [address]  DIAGNOSIS: Recurrent multiple myeloma initially diagnosed in October 2008.   PRIOR THERAPY:  1. Status post palliative radiotherapy to the lumbar spine between L3 and L5. The patient received a total dose of 3000 cGy in 10 fractions under the care of Dr. Moody between May 05, 2007 through May 18, 2007. 2. Status post 5 cycles of systemic chemotherapy with Revlimid and low-dose Decadron with good response to this treatment. 3. Status post autologous peripheral blood stem cell transplant at [facility] on Nov 20, 2007 under the care of Dr. Gabriel. 4. Status post treatment for disease recurrence with Velcade, Doxil and Decadron. Last dose given Nov 09, 2009. Discontinued secondary to intolerance but the patient had a good response to treatment at that time. 5. Status post palliative radiotherapy to the T2-T6 thoracic vertebrae completed 03/21/2011 under the care of Dr. Moody. 6. Systemic chemotherapy with Velcade 1.3 mg per meter squared given on days 1, 4, 8 and 11, and Doxil at 30 mg per meter squared given on day 4 in addition to Decadron status post 4 cycles, discontinued secondary to intolerance. 7. Systemic therapy with Velcade 1.3 mg/M2 subcutaneously in addition to Decadron 40 mg by mouth on a weekly basis, status post 20 cycles. The patient had good response with this treatment but it is discontinue today secondary to worsening peripheral neuropathy. 8. Palliative radiotherapy to the skull lesion as well as the left hip area under the care of Dr. Moody. 9. Systemic chemotherapy with Carfilzomib 20 mg/M2 on days 1, 2,  8, 9, 13 and 16 every 4 weeks in addition to weekly Decadron 40 mg by mouth. First dose on 04/19/2013. Status post 4 cycles, discontinued recently secondary to cardiac  dysfunction. 10. Pomalyst 3 mg by mouth daily for 21 days every 4 weeks in addition to dexamethasone 40 mg on a weekly basis. First dose started 12/02/2013. Status post 28 cycles. Her dose of Pomalyst will be reduced to 2 mg by mouth daily for 21 days every 4 weeks starting from cycle 23, discontinued secondary to disease progression.    CURRENT THERAPY:  1. Systemic chemotherapy with Carfilzomib 27 MG/M2, Cytoxan 300 MG/M2 on days 1, 2, 8, 9, 15 and 16 every 4 weeks in addition to weekly Decadron 20 mg by mouth. First cycle 03/25/2016.  2. Zometa 4 mg IV given every 3 months.   INTERVAL HISTORY: [name] 65 y.o. female returns to the clinic today for follow up visit accompanied by her daughter. The patient is feeling fine today with no specific complaints except for low back pain. She denied having any significant chest pain, shortness breath, cough or hemoptysis. She has no current nausea, vomiting, diarrhea or constipation. She has persistent low back pain and she is currently on pain medication with MS Contin and Norco. She denied having any significant weight loss or night sweats. She is tolerating her treatment with Pomalyst fairly well. She had repeat myeloma panel performed recently and she is here for evaluation and discussion of her lab results and recommendation regarding her condition.  MEDICAL HISTORY: Past Medical History:  Diagnosis Date  . Compression fracture 04/08/2007   pathologic compression fracture  . Constipation    takes oxycontin,vicodin  . Family history of anesthesia complication    "daughter gets PONV too"  . FHx: chemotherapy      s/p 5 cycle revlimid/low dose decadron,s/p velcade,doxil,decadron,  . GERD (gastroesophageal reflux disease)   . History of autologous stem cell transplant (HCC) 11/20/2007   [facility], Dr Gabriel  . Hx of radiation therapy 05/05/07-05/18/07,& 03/05/11-03/21/11-   l3&l5 in 2008, t2-t6 03/2011  . Hx of radiation therapy 05/05/2007 to  05/18/2007   palliative, L3-5  . Hx of radiation therapy 03/05/2011 to 03/21/2011   palliative T2-T6, c-spine  . Hypercholesterolemia   . Hypothyroidism   . Insomnia    associated with steroids  . Metastasis to bone (HCC)   . Multiple myeloma (HCC) dx'd 2009  . Pneumonia    "several times"  . PONV (postoperative nausea and vomiting)   . Stroke (HCC) 2014   denies residual on 01/27/2014    ALLERGIES:  has No Known Allergies.  MEDICATIONS:  Current Outpatient Prescriptions  Medication Sig Dispense Refill  . ALPRAZolam (XANAX) 0.5 MG tablet TAKE 1/2 TO 1 TABLET 2 TO 3 TIMES DAILY AS NEEDED FOR ANXIETY 270 tablet 1  . aspirin 81 MG tablet Take 81 mg by mouth as directed. Mondays, Wednesdays, and Fridays    . blood glucose meter kit and supplies Test blood sugars once a day 1 each 0  . citalopram (CELEXA) 40 MG tablet TAKE 1 TABLET (40 MG TOTAL) BY MOUTH DAILY. 90 tablet 2  . cyclobenzaprine (FLEXERIL) 10 MG tablet Take 1 tablet (10 mg total) by mouth 3 (three) times daily as needed for muscle spasms. 90 tablet 3  . dexamethasone (DECADRON) 4 MG tablet TAKE (10) TABLETS BY MOUTH EVERY FRIDAY (Patient taking differently: TAKE (5) TABLETS BY MOUTH EVERY FRIDAY) 40 tablet 1  . diphenoxylate-atropine (LOMOTIL) 2.5-0.025 MG tablet TAKE 2 TABLETS BY MOUTH 4 TIMES A DAY AS NEEDED FOR DIARRHEA 30 tablet 1  . Eszopiclone 3 MG TABS TAKE 1 TABLET BY MOUTH AT BEDTIME AS NEEDED FOR SLEEP 30 tablet 2  . furosemide (LASIX) 40 MG tablet TAKE 1 TABLET BY MOUTH TWICE A DAY FOR FLUID AND SWELLING 90 tablet 1  . gabapentin (NEURONTIN) 600 MG tablet TAKE 1 TABLET 4 X DAILY AS NEEDED FOR PAIN OR CRAMPS 120 tablet 4  . HYDROcodone-acetaminophen (NORCO) 7.5-325 MG tablet Take 1 tablet by mouth every 6 (six) hours as needed for moderate pain. 30 tablet 0  . KLOR-CON 10 10 MEQ tablet TAKE 1 TABLET BY MOUTH TWICE A DAY (Patient taking differently: TAKE 1 TABLET BY MOUTH THREE TIMES DAILY X1 WEEK. --USUALLY ONLY TAKES  TWO DAILY.) 180 tablet 1  . loperamide (IMODIUM) 2 MG capsule Take 2 mg by mouth as needed for diarrhea or loose stools. Reported on 10/02/2015    . Magnesium 500 MG TABS Take 500 mg by mouth 2 (two) times daily.     . mometasone (NASONEX) 50 MCG/ACT nasal spray PLACE 2 SPRAYS INTO THE NOSE DAILY. (Patient taking differently: PLACE 2 SPRAYS INTO THE NOSE DAILY AS NEEDED FOR ALLERGIES.) 51 g 1  . montelukast (SINGULAIR) 10 MG tablet TAKE 1 TABLET BY MOUTH EVERY DAY 90 tablet 1  . morphine (MS CONTIN) 15 MG 12 hr tablet Take 1 tablet (15 mg total) by mouth every 12 (twelve) hours. 60 tablet 0  . Multiple Vitamin (MULITIVITAMIN WITH MINERALS) TABS Take 1 tablet by mouth every morning.     . ondansetron (ZOFRAN-ODT) 8 MG disintegrating tablet TAKE 1 TABLET BY MOUTH EVERY 8 HOURS AS NEEDED FOR NAUSEA AND VOMITING 30 tablet 1  . pantoprazole (PROTONIX) 40 MG tablet TAKE 1 TABLET (  40 MG TOTAL) BY MOUTH 2 (TWO) TIMES DAILY. (Patient taking differently: Take 40 mg by mouth daily. ) 90 tablet 1  . pomalidomide (POMALYST) 2 MG capsule Take 1 capsule (2 mg total) by mouth daily. Take with water on days 1-21. Repeat every 28 days. 21 capsule 0  . SYNTHROID 100 MCG tablet TAKE 1 TABLET BY MOUTH DAILY 90 tablet 1  . traZODone (DESYREL) 150 MG tablet TAKE 1/2 TO 1 TABLET 1 HOUR BEFORE SLEEP. 90 tablet 3  . metolazone (ZAROXOLYN) 2.5 MG tablet      No current facility-administered medications for this visit.     SURGICAL HISTORY:  Past Surgical History:  Procedure Laterality Date  . CHOLECYSTECTOMY  1980's  . KNEE ARTHROSCOPY Right    "put pin in"  . KNEE ARTHROSCOPY Right    "took pin out and corrected what was wrong"  . PORTACATH PLACEMENT Right 2009  . VAGINAL HYSTERECTOMY  1980's  . VIDEO BRONCHOSCOPY  07/30/2011   Procedure: VIDEO BRONCHOSCOPY WITHOUT FLUORO;  Surgeon: Kathee Delton, MD;  Location: [facility];  Service: Cardiopulmonary;  Laterality: Bilateral;    REVIEW OF SYSTEMS:   Constitutional: positive for fatigue Eyes: negative Ears, nose, mouth, throat, and face: negative Respiratory: negative Cardiovascular: negative Gastrointestinal: negative Genitourinary:negative Integument/breast: negative Hematologic/lymphatic: negative Musculoskeletal:positive for back pain Neurological: negative Behavioral/Psych: negative Endocrine: negative Allergic/Immunologic: negative   PHYSICAL EXAMINATION: General appearance: alert, cooperative, fatigued and no distress Head: Normocephalic, without obvious abnormality, atraumatic Neck: no adenopathy, no JVD, supple, symmetrical, trachea midline and thyroid not enlarged, symmetric, no tenderness/mass/nodules Lymph nodes: Cervical, supraclavicular, and axillary nodes normal. Resp: clear to auscultation bilaterally Back: symmetric, no curvature. ROM normal. No CVA tenderness. Cardio: regular rate and rhythm, S1, S2 normal, no murmur, click, rub or gallop GI: soft, non-tender; bowel sounds normal; no masses,  no organomegaly Extremities: edema 1+ edema of left lower extremity. Neurologic: Alert and oriented X 3, normal strength and tone. Normal symmetric reflexes. Normal coordination and gait  ECOG PERFORMANCE STATUS: 1 - Symptomatic but completely ambulatory  Blood pressure (!) 116/53, pulse 81, temperature 98 F (36.7 C), temperature source Oral, resp. rate 18, height 5' 4.75" (1.645 m), weight 210 lb 8 oz (95.5 kg), SpO2 98 %.  LABORATORY DATA: Lab Results  Component Value Date   WBC 2.5 (L) 03/11/2016   HGB 10.7 (L) 03/11/2016   HCT 32.9 (L) 03/11/2016   MCV 101.5 (H) 03/11/2016   PLT 132 (L) 03/11/2016      Chemistry      Component Value Date/Time   NA 140 03/11/2016 1120   K 3.8 03/11/2016 1120   CL 111 02/03/2016 1614   CL 105 12/17/2012 1015   CO2 25 03/11/2016 1120   BUN 8.8 03/11/2016 1120   CREATININE 0.9 03/11/2016 1120      Component Value Date/Time   CALCIUM 8.7 03/11/2016 1120   ALKPHOS 75  03/11/2016 1120   AST 13 03/11/2016 1120   ALT 19 03/11/2016 1120   BILITOT 0.70 03/11/2016 1120     Myeloma panel Performed 03/11/2016 showed beta 2 microglobulin 2.3, IgG 2105, IgA 93 and IgM 10. Free kappa light chain 19.5, free lambda light chain 170.2 and a kappa/lambda ratio 0.11  RADIOGRAPHIC STUDIES: No results found. ASSESSMENT AND PLAN:  This is a very pleasant 65 years old [place] American female with recurrent multiple myeloma recently completed a course of treatment with Velcade and Decadron with improvement in her disease but this was discontinued secondary to peripheral  neuropathy. The patient tolerated her treatment with Carfilzomib and Decadron fairly well except for the recent shortness breath and cough which was felt to be secondary to congestive heart failure from her treatment with Carfilzomib. This treatment was discontinued. She was started on treatment with Pomalyst and Decadron status post 28 cycles. She tolerated the last cycle of her treatment well. I recommended for the patient to continue her treatment with Pomalyst at reduced dose of 2 mg.  Unfortunately the recent myeloma panel showed significant elevation of the free lambda light chain. I discussed the results with the patient and her daughter. I recommended for her to discontinue her current treatment with Pomalyst and Decadron. I discussed with the patient other option for treatment of her condition including treatment again with Carfilzomib but in addition to Cytoxan and Decadron. I discussed with the patient adverse effect of this treatment including the risk of cardiac dysfunction. She agreement with the current plan and she is expected to start the first dose of her treatment on 03/25/2016. I will continue to monitor her closely during her treatment with repeat myeloma panel in 2 months. She will come back for follow-up visit in one month with the start of cycle #2. For the back pain she will continue on MS  Contin and Norco. She was advised to call immediately if she has any concerning symptoms in the interval. The patient voices understanding of current disease status and treatment options and is in agreement with the current care plan.  All questions were answered. The patient knows to call the clinic with any problems, questions or concerns. We can certainly see the patient much sooner if necessary.  Disclaimer: This note was dictated with voice recognition software. Similar sounding words can inadvertently be transcribed and may be missed upon review.

## 2016-03-18 NOTE — Telephone Encounter (Signed)
Per LOS I have scheduled appts and notified the scheduler. Notifiied schedule patient only needs labs on Monday, and Monday 9/25 labs needs to be moved.

## 2016-03-18 NOTE — Patient Instructions (Signed)

## 2016-03-18 NOTE — Telephone Encounter (Signed)
Pt called to clarify appt times today. Done.

## 2016-03-18 NOTE — Telephone Encounter (Signed)
Message sent to chemo scheduler to add chemo. Avs report and appointment schedule given to patient, per 03/18/16 los.

## 2016-03-20 ENCOUNTER — Ambulatory Visit (HOSPITAL_COMMUNITY)
Admission: RE | Admit: 2016-03-20 | Discharge: 2016-03-20 | Disposition: A | Discharge status: Home / Self Care | Payer: Medicare Other | Source: Ambulatory Visit | Attending: Internal Medicine | Admitting: Internal Medicine

## 2016-03-20 ENCOUNTER — Encounter: Payer: Self-pay | Admitting: Internal Medicine

## 2016-03-20 ENCOUNTER — Ambulatory Visit (INDEPENDENT_AMBULATORY_CARE_PROVIDER_SITE_OTHER): Payer: Medicare Other | Admitting: Internal Medicine

## 2016-03-20 VITALS — BP 118/58 | HR 92 | Temp 98.2°F | Resp 18 | Ht 64.75 in | Wt 200.0 lb

## 2016-03-20 DIAGNOSIS — R05 Cough: Secondary | ICD-10-CM | POA: Insufficient documentation

## 2016-03-20 DIAGNOSIS — N183 Chronic kidney disease, stage 3 unspecified: Secondary | ICD-10-CM

## 2016-03-20 DIAGNOSIS — E782 Mixed hyperlipidemia: Secondary | ICD-10-CM | POA: Diagnosis not present

## 2016-03-20 DIAGNOSIS — R531 Weakness: Secondary | ICD-10-CM

## 2016-03-20 DIAGNOSIS — R600 Localized edema: Secondary | ICD-10-CM

## 2016-03-20 DIAGNOSIS — R609 Edema, unspecified: Secondary | ICD-10-CM

## 2016-03-20 DIAGNOSIS — R0989 Other specified symptoms and signs involving the circulatory and respiratory systems: Secondary | ICD-10-CM | POA: Diagnosis not present

## 2016-03-20 DIAGNOSIS — E1122 Type 2 diabetes mellitus with diabetic chronic kidney disease: Secondary | ICD-10-CM

## 2016-03-20 DIAGNOSIS — Z0001 Encounter for general adult medical examination with abnormal findings: Secondary | ICD-10-CM

## 2016-03-20 DIAGNOSIS — I10 Essential (primary) hypertension: Secondary | ICD-10-CM

## 2016-03-20 DIAGNOSIS — Z1211 Encounter for screening for malignant neoplasm of colon: Secondary | ICD-10-CM

## 2016-03-20 DIAGNOSIS — Z0000 Encounter for general adult medical examination without abnormal findings: Secondary | ICD-10-CM | POA: Diagnosis not present

## 2016-03-20 DIAGNOSIS — R059 Cough, unspecified: Secondary | ICD-10-CM

## 2016-03-20 DIAGNOSIS — C9002 Multiple myeloma in relapse: Secondary | ICD-10-CM

## 2016-03-20 DIAGNOSIS — E039 Hypothyroidism, unspecified: Secondary | ICD-10-CM

## 2016-03-20 DIAGNOSIS — Z79899 Other long term (current) drug therapy: Secondary | ICD-10-CM

## 2016-03-20 DIAGNOSIS — E559 Vitamin D deficiency, unspecified: Secondary | ICD-10-CM

## 2016-03-20 DIAGNOSIS — E876 Hypokalemia: Secondary | ICD-10-CM

## 2016-03-20 LAB — CBC WITH DIFFERENTIAL/PLATELET
BASOS ABS: 37 {cells}/uL (ref 0–200)
Basophils Relative: 1 %
EOS PCT: 1 %
Eosinophils Absolute: 37 cells/uL (ref 15–500)
HCT: 33.1 % — ABNORMAL LOW (ref 35.0–45.0)
HEMOGLOBIN: 10.7 g/dL — AB (ref 11.7–15.5)
LYMPHS ABS: 1221 {cells}/uL (ref 850–3900)
LYMPHS PCT: 33 %
MCH: 33.3 pg — AB (ref 27.0–33.0)
MCHC: 32.3 g/dL (ref 32.0–36.0)
MCV: 103.1 fL — ABNORMAL HIGH (ref 80.0–100.0)
MONOS PCT: 12 %
MPV: 9.8 fL (ref 7.5–12.5)
Monocytes Absolute: 444 cells/uL (ref 200–950)
NEUTROS PCT: 53 %
Neutro Abs: 1961 cells/uL (ref 1500–7800)
Platelets: 173 10*3/uL (ref 140–400)
RBC: 3.21 MIL/uL — ABNORMAL LOW (ref 3.80–5.10)
RDW: 16.4 % — AB (ref 11.0–15.0)
WBC: 3.7 10*3/uL — AB (ref 3.8–10.8)

## 2016-03-20 LAB — IRON AND TIBC
%SAT: 22 % (ref 11–50)
Iron: 71 ug/dL (ref 45–160)
TIBC: 322 ug/dL (ref 250–450)
UIBC: 251 ug/dL (ref 125–400)

## 2016-03-20 LAB — HEPATIC FUNCTION PANEL
ALT: 17 U/L (ref 6–29)
AST: 14 U/L (ref 10–35)
Albumin: 3.6 g/dL (ref 3.6–5.1)
Alkaline Phosphatase: 66 U/L (ref 33–130)
BILIRUBIN DIRECT: 0.2 mg/dL (ref <=0.2)
Indirect Bilirubin: 0.5 mg/dL (ref 0.2–1.2)
TOTAL PROTEIN: 7.1 g/dL (ref 6.1–8.1)
Total Bilirubin: 0.7 mg/dL (ref 0.2–1.2)

## 2016-03-20 LAB — BASIC METABOLIC PANEL WITH GFR
BUN: 9 mg/dL (ref 7–25)
CALCIUM: 8.2 mg/dL — AB (ref 8.6–10.4)
CO2: 23 mmol/L (ref 20–31)
CREATININE: 0.92 mg/dL (ref 0.50–0.99)
Chloride: 107 mmol/L (ref 98–110)
GFR, EST AFRICAN AMERICAN: 76 mL/min (ref >=60)
GFR, Est Non African American: 66 mL/min (ref >=60)
Glucose, Bld: 96 mg/dL (ref 65–99)
Potassium: 3.6 mmol/L (ref 3.5–5.3)
SODIUM: 140 mmol/L (ref 135–146)

## 2016-03-20 LAB — LIPID PANEL
CHOLESTEROL: 168 mg/dL (ref 125–200)
HDL: 41 mg/dL — ABNORMAL LOW (ref >=46)
LDL Cholesterol: 55 mg/dL (ref <130)
Total CHOL/HDL Ratio: 4.1 Ratio (ref <=5.0)
Triglycerides: 361 mg/dL — ABNORMAL HIGH (ref <150)
VLDL: 72 mg/dL — ABNORMAL HIGH (ref <30)

## 2016-03-20 LAB — VITAMIN B12: Vitamin B-12: 315 pg/mL (ref 200–1100)

## 2016-03-20 LAB — MAGNESIUM: MAGNESIUM: 1.8 mg/dL (ref 1.5–2.5)

## 2016-03-20 LAB — TSH: TSH: 6.3 mIU/L — ABNORMAL HIGH

## 2016-03-20 NOTE — Progress Notes (Signed)
Complete Physical  Assessment and Plan:   1. Encounter for general adult medical examination with abnormal findings  - CBC with Differential/Platelet - BASIC METABOLIC PANEL WITH GFR - Hepatic function panel - Magnesium  2. Essential hypertension -well controlled -cont meds -consider stopping lasix if still electrolyte abnormalities -dash diet - Urinalysis, Routine w reflex microscopic (not at [facility]) - Microalbumin / creatinine urine ratio  3. Hypothyroidism, unspecified hypothyroidism type -cont levothyroxine -dose adjust if necessary - TSH  4. CKD stage 3 due to type 2 diabetes mellitus ([place]) -well controlled per reports of patient -cont diet and exercise - Hemoglobin A1c - Insulin, random  5. Multiple myeloma in relapse [facility]) -forward to Dr. Julien Nordmann, followed by oncology -not currently on chemotherapy - Iron and TIBC - Vitamin B12 - DG Chest 2 View; Future  6. Medication management  - CBC with Differential/Platelet - BASIC METABOLIC PANEL WITH GFR - Hepatic function panel - Magnesium  7. Hyperlipidemia -cont diet and exercise - Lipid panel  8. Vitamin D deficiency -cont supplement - VITAMIN D 25 Hydroxy (Vit-D Deficiency, Fractures)  9. Peripheral edema -none on exam today -cont use of compression socks  10. Hypokalemia -BMET  11. Hypomagnesemia -magnesium level -cont supplement  12. Weakness generalized -encouraged continued exercise  13. Screen for colon cancer  - POC Hemoccult Bld/Stl (3-Cd Home Screen); Future  14. Cough -cont allergy medications including singulair, flonase -cont daily antihistamine - DG Chest 2 View; Future   Discussed med's effects and SE's. Screening labs and tests as requested with regular follow-up as recommended.  HPI  65 y.o. female  presents for a complete physical.  Her blood pressure has been controlled at home, today their BP is BP: (!) 118/58.  She does not workout. She denies chest pain, shortness  of breath, dizziness.   She is on cholesterol medication and denies myalgias. Her cholesterol is at goal. The cholesterol last visit was:  Lab Results  Component Value Date   CHOL 178 10/03/2015   HDL 79 10/03/2015   LDLCALC 63 10/03/2015   TRIG 178 (H) 10/03/2015   CHOLHDL 2.3 10/03/2015  .  She has been working on diet and exercise for prediabetes, she is on bASA, she is on ACE/ARB and denies foot ulcerations, hyperglycemia, hypoglycemia , increased appetite, nausea, paresthesia of the feet, polydipsia, polyuria, visual disturbances, vomiting and weight loss. Last A1C in the office was:  Lab Results  Component Value Date   HGBA1C 6.0 (H) 10/03/2015    Patient is on Vitamin D supplement.   Lab Results  Component Value Date   VD25OH 20 03/14/2015     She reports that currently she is off of chemotherapy for her Multiple myeloma.  She reports that she is having a lot of pain.  She reports that she does not even feel her pain medication.  She reports that she is still having a lot of emotional mood swings and a lack of desire to get out of the house.  She has not been able to see the therapists at the cancer center.    She reports that she is still having some cramps and spasms in her feet.  She reports that she only gets them when she holds things for a time.  She does get relief when she takes flexeril.    She feels like her fluid pills are not working well for her any more.   She does not urinate much when she takes them.  Current Medications:  Current Outpatient Prescriptions on File Prior to Visit  Medication Sig Dispense Refill  . ALPRAZolam (XANAX) 0.5 MG tablet TAKE 1/2 TO 1 TABLET 2 TO 3 TIMES DAILY AS NEEDED FOR ANXIETY 270 tablet 1  . aspirin 81 MG tablet Take 81 mg by mouth as directed. Mondays, Wednesdays, and Fridays    . blood glucose meter kit and supplies Test blood sugars once a day 1 each 0  . citalopram (CELEXA) 40 MG tablet TAKE 1 TABLET (40 MG TOTAL) BY  MOUTH DAILY. 90 tablet 2  . cyclobenzaprine (FLEXERIL) 10 MG tablet Take 1 tablet (10 mg total) by mouth 3 (three) times daily as needed for muscle spasms. 90 tablet 3  . dexamethasone (DECADRON) 4 MG tablet TAKE (10) TABLETS BY MOUTH EVERY FRIDAY (Patient taking differently: TAKE (5) TABLETS BY MOUTH EVERY FRIDAY) 40 tablet 1  . diphenoxylate-atropine (LOMOTIL) 2.5-0.025 MG tablet TAKE 2 TABLETS BY MOUTH 4 TIMES A DAY AS NEEDED FOR DIARRHEA 30 tablet 1  . Eszopiclone 3 MG TABS TAKE 1 TABLET BY MOUTH AT BEDTIME AS NEEDED FOR SLEEP 30 tablet 2  . furosemide (LASIX) 40 MG tablet TAKE 1 TABLET BY MOUTH TWICE A DAY FOR FLUID AND SWELLING 90 tablet 1  . gabapentin (NEURONTIN) 600 MG tablet TAKE 1 TABLET 4 X DAILY AS NEEDED FOR PAIN OR CRAMPS 120 tablet 4  . HYDROcodone-acetaminophen (NORCO) 7.5-325 MG tablet Take 1 tablet by mouth every 6 (six) hours as needed for moderate pain. 30 tablet 0  . KLOR-CON 10 10 MEQ tablet TAKE 1 TABLET BY MOUTH TWICE A DAY (Patient taking differently: TAKE 1 TABLET BY MOUTH THREE TIMES DAILY X1 WEEK. --USUALLY ONLY TAKES TWO DAILY.) 180 tablet 1  . loperamide (IMODIUM) 2 MG capsule Take 2 mg by mouth as needed for diarrhea or loose stools. Reported on 10/02/2015    . Magnesium 500 MG TABS Take 500 mg by mouth 2 (two) times daily.     . metolazone (ZAROXOLYN) 2.5 MG tablet     . mometasone (NASONEX) 50 MCG/ACT nasal spray PLACE 2 SPRAYS INTO THE NOSE DAILY. (Patient taking differently: PLACE 2 SPRAYS INTO THE NOSE DAILY AS NEEDED FOR ALLERGIES.) 51 g 1  . montelukast (SINGULAIR) 10 MG tablet TAKE 1 TABLET BY MOUTH EVERY DAY 90 tablet 1  . morphine (MS CONTIN) 15 MG 12 hr tablet Take 1 tablet (15 mg total) by mouth every 12 (twelve) hours. 60 tablet 0  . Multiple Vitamin (MULITIVITAMIN WITH MINERALS) TABS Take 1 tablet by mouth every morning.     . ondansetron (ZOFRAN-ODT) 8 MG disintegrating tablet TAKE 1 TABLET BY MOUTH EVERY 8 HOURS AS NEEDED FOR NAUSEA AND VOMITING 30 tablet  1  . pantoprazole (PROTONIX) 40 MG tablet TAKE 1 TABLET (40 MG TOTAL) BY MOUTH 2 (TWO) TIMES DAILY. (Patient taking differently: Take 40 mg by mouth daily. ) 90 tablet 1  . SYNTHROID 100 MCG tablet TAKE 1 TABLET BY MOUTH DAILY 90 tablet 1  . traZODone (DESYREL) 150 MG tablet TAKE 1/2 TO 1 TABLET 1 HOUR BEFORE SLEEP. 90 tablet 3   No current facility-administered medications on file prior to visit.     Health Maintenance:   Immunization History  Administered Date(s) Administered  . Influenza,inj,Quad PF,36+ Mos 04/01/2014, 03/23/2015  . Influenza-Unspecified 03/31/2014  . Pneumococcal-Unspecified 07/01/2008  . Tdap 07/01/2008    Tetanus: 2010 Flu vaccine: Not in stock Pap: status post hysterectomy MGM: 2017  Colonoscopy: 2013  Patient Care Team:  Unk Pinto, MD as PCP - General (Internal Medicine) Curt Bears, MD as Consulting Physician (Oncology) Lelon Perla, MD as Consulting Physician (Cardiology) Wonda Horner, MD as Consulting Physician (Gastroenterology) Marybelle Killings, MD as Consulting Physician (Orthopedic Surgery) Kyung Rudd, MD as Consulting Physician (Radiation Oncology) Arnoldo Hooker (Hematology and Oncology) Laurence Spates, MD as Consulting Physician (Gastroenterology) Marybelle Killings, MD as Consulting Physician (Orthopedic Surgery) Alda Berthold, DO as Consulting Physician (Neurology) Diane Dalphine Handing (Optometry) Milus Banister, MD as Attending Physician (Gastroenterology)  Allergies: No Known Allergies  Medical History:  Past Medical History:  Diagnosis Date  . Compression fracture 04/08/2007   pathologic compression fracture  . Constipation    takes oxycontin,vicodin  . Family history of anesthesia complication    "daughter gets PONV too"  . FHx: chemotherapy    s/p 5 cycle revlimid/low dose decadron,s/p velcade,doxil,decadron,  . GERD (gastroesophageal reflux disease)   . 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 Hx of radiation therapy 05/05/07-05/18/07,& 03/05/11-03/21/11-   l3&l5 in 2008, t2-t6 03/2011  . Hx of radiation therapy 05/05/2007 to 05/18/2007   palliative, L3-5  . Hx of radiation therapy 03/05/2011 to 03/21/2011   palliative T2-T6, c-spine  . Hypercholesterolemia   . Hypothyroidism   . Insomnia    associated with steroids  . Metastasis to bone ([place])   . Multiple myeloma ([place]) dx'd 2009  . Pneumonia    "several times"  . PONV (postoperative nausea and vomiting)   . Stroke [facility]) 2014   denies residual on 01/27/2014    Surgical History:  Past Surgical History:  Procedure Laterality Date  . CHOLECYSTECTOMY  1980's  . KNEE ARTHROSCOPY Right    "put pin in"  . KNEE ARTHROSCOPY Right    "took pin out and corrected what was wrong"  . PORTACATH PLACEMENT Right 2009  . VAGINAL HYSTERECTOMY  1980's  . VIDEO BRONCHOSCOPY  07/30/2011   Procedure: VIDEO BRONCHOSCOPY WITHOUT FLUORO;  Surgeon: Kathee Delton, MD;  Location: [facility] ENDOSCOPY;  Service: Cardiopulmonary;  Laterality: Bilateral;    Family History:  Family History  Problem Relation Age of Onset  . Lung cancer Brother   . Colon cancer Maternal Uncle   . Breast cancer Maternal Grandmother     Social History:  Social History  Substance Use Topics  . Smoking status: Former Smoker    Packs/day: 0.25    Years: 6.00    Types: Cigarettes    Quit date: 08/27/1978  . Smokeless tobacco: Never Used  . Alcohol use No     Comment: "quit drinking in the 1980's", previously drank on the weekend    Review of Systems: Review of Systems  Constitutional: Positive for malaise/fatigue. Negative for chills and fever.  HENT: Positive for congestion. Negative for ear pain and sore throat.   Respiratory: Positive for shortness of breath. Negative for cough, sputum production and wheezing.   Cardiovascular: Negative for chest pain, palpitations and leg swelling.  Gastrointestinal: Positive for constipation and diarrhea.  Negative for blood in stool, heartburn and melena.  Genitourinary: Negative for dysuria, frequency, hematuria and urgency.  Neurological: Positive for sensory change. Negative for dizziness and headaches.  Psychiatric/Behavioral: Positive for depression. The patient has insomnia. The patient is not nervous/anxious.     Physical Exam: Estimated body mass index is 33.54 kg/m as calculated from the following:   Height as of this encounter: 5' 4.75" (1.645 m).   Weight as  of this encounter: 200 lb (90.7 kg). BP (!) 118/58   Pulse 92   Temp 98.2 F (36.8 C) (Temporal)   Resp 18   Ht 5' 4.75" (1.645 m)   Wt 200 lb (90.7 kg)   BMI 33.54 kg/m   General Appearance: Well nourished well developed, in no apparent distress.  Eyes: PERRLA, EOMs, conjunctiva no swelling or erythema ENT/Mouth: Ear canals normal without obstruction, swelling, erythema, or discharge.  TMs normal bilaterally with no erythema, bulging, retraction, or loss of landmark.  Oropharynx moist and clear with no exudate, erythema, or swelling.   Neck: Supple, thyroid normal. No bruits.  No cervical adenopathy Respiratory: Respiratory effort normal, Breath sounds clear A&P without wheeze, rhonchi, rales.   Cardio: RRR without murmurs, rubs or gallops. Brisk peripheral pulses without edema. Left leg diameter>right lower leg.  No changes from baseline.  No peripheral edema Chest: symmetric, with normal excursions Breasts: Symmetric, without lumps, nipple discharge, retractions.  Abdomen: Soft, generalized mild tenderness, no guarding, rebound, hernias, masses, or organomegaly.  Lymphatics: Non tender without lymphadenopathy.  Musculoskeletal: Full ROM all peripheral extremities,  Limited ROM of the lumbar spine secondary to pain.  4/5 strength, and normal gait.  Skin: Warm, dry without rashes, lesions, ecchymosis. Neuro: Awake and oriented X 3, Cranial nerves intact, reflexes equal bilaterally. Normal muscle tone, no cerebellar  symptoms. Sensation intact.  Psych:  Depressed affect, Insight and Judgment appropriate.    Over 40 minutes of exam, counseling, chart review and critical decision making was performed  Starlyn Skeans 9:26 AM [facility]

## 2016-03-21 LAB — HEMOGLOBIN A1C
HEMOGLOBIN A1C: 5.9 % — AB (ref <5.7)
MEAN PLASMA GLUCOSE: 123 mg/dL

## 2016-03-21 LAB — URINALYSIS, MICROSCOPIC ONLY
Casts: NONE SEEN [LPF]
Crystals: NONE SEEN [HPF]
RBC / HPF: NONE SEEN RBC/HPF (ref <=2)
YEAST: NONE SEEN [HPF]

## 2016-03-21 LAB — URINALYSIS, ROUTINE W REFLEX MICROSCOPIC
BILIRUBIN URINE: NEGATIVE
Glucose, UA: NEGATIVE
Hgb urine dipstick: NEGATIVE
Ketones, ur: NEGATIVE
NITRITE: NEGATIVE
PH: 7 (ref 5.0–8.0)
SPECIFIC GRAVITY, URINE: 1.016 (ref 1.001–1.035)

## 2016-03-21 LAB — INSULIN, RANDOM: INSULIN: 77.6 u[IU]/mL — AB (ref 2.0–19.6)

## 2016-03-21 LAB — MICROALBUMIN / CREATININE URINE RATIO
Creatinine, Urine: 178 mg/dL (ref 20–320)
MICROALB/CREAT RATIO: 28 ug/mg{creat} (ref <30)
Microalb, Ur: 4.9 mg/dL

## 2016-03-21 LAB — VITAMIN D 25 HYDROXY (VIT D DEFICIENCY, FRACTURES): Vit D, 25-Hydroxy: 43 ng/mL (ref 30–100)

## 2016-03-22 ENCOUNTER — Other Ambulatory Visit: Payer: Self-pay | Admitting: Internal Medicine

## 2016-03-25 ENCOUNTER — Other Ambulatory Visit (HOSPITAL_BASED_OUTPATIENT_CLINIC_OR_DEPARTMENT_OTHER): Payer: Medicare Other

## 2016-03-25 ENCOUNTER — Telehealth: Payer: Self-pay | Admitting: Internal Medicine

## 2016-03-25 ENCOUNTER — Ambulatory Visit (HOSPITAL_BASED_OUTPATIENT_CLINIC_OR_DEPARTMENT_OTHER): Payer: Medicare Other

## 2016-03-25 VITALS — BP 124/69 | HR 80 | Temp 97.7°F | Resp 18

## 2016-03-25 DIAGNOSIS — C9002 Multiple myeloma in relapse: Secondary | ICD-10-CM

## 2016-03-25 DIAGNOSIS — Z5112 Encounter for antineoplastic immunotherapy: Secondary | ICD-10-CM | POA: Diagnosis not present

## 2016-03-25 DIAGNOSIS — Z5111 Encounter for antineoplastic chemotherapy: Secondary | ICD-10-CM | POA: Diagnosis not present

## 2016-03-25 DIAGNOSIS — Z23 Encounter for immunization: Secondary | ICD-10-CM | POA: Diagnosis not present

## 2016-03-25 LAB — CBC WITH DIFFERENTIAL/PLATELET
BASO%: 0.8 % (ref 0.0–2.0)
BASOS ABS: 0 10*3/uL (ref 0.0–0.1)
EOS ABS: 0 10*3/uL (ref 0.0–0.5)
EOS%: 1.1 % (ref 0.0–7.0)
HCT: 32.9 % — ABNORMAL LOW (ref 34.8–46.6)
HEMOGLOBIN: 10.9 g/dL — AB (ref 11.6–15.9)
LYMPH%: 36.6 % (ref 14.0–49.7)
MCH: 33.9 pg (ref 25.1–34.0)
MCHC: 33.2 g/dL (ref 31.5–36.0)
MCV: 102.2 fL — AB (ref 79.5–101.0)
MONO#: 0.4 10*3/uL (ref 0.1–0.9)
MONO%: 9.2 % (ref 0.0–14.0)
NEUT%: 52.3 % (ref 38.4–76.8)
NEUTROS ABS: 2 10*3/uL (ref 1.5–6.5)
PLATELETS: 207 10*3/uL (ref 145–400)
RBC: 3.22 10*6/uL — ABNORMAL LOW (ref 3.70–5.45)
RDW: 16.9 % — AB (ref 11.2–14.5)
WBC: 3.9 10*3/uL (ref 3.9–10.3)
lymph#: 1.4 10*3/uL (ref 0.9–3.3)

## 2016-03-25 LAB — COMPREHENSIVE METABOLIC PANEL
ALBUMIN: 3.2 g/dL — AB (ref 3.5–5.0)
ALK PHOS: 99 U/L (ref 40–150)
ALT: 22 U/L (ref 0–55)
ANION GAP: 10 meq/L (ref 3–11)
AST: 16 U/L (ref 5–34)
BILIRUBIN TOTAL: 0.48 mg/dL (ref 0.20–1.20)
BUN: 19.1 mg/dL (ref 7.0–26.0)
CALCIUM: 10.2 mg/dL (ref 8.4–10.4)
CO2: 24 mEq/L (ref 22–29)
Chloride: 109 mEq/L (ref 98–109)
Creatinine: 1.2 mg/dL — ABNORMAL HIGH (ref 0.6–1.1)
EGFR: 58 mL/min/{1.73_m2} — AB (ref >90)
GLUCOSE: 122 mg/dL (ref 70–140)
POTASSIUM: 3.8 meq/L (ref 3.5–5.1)
SODIUM: 143 meq/L (ref 136–145)
TOTAL PROTEIN: 7.6 g/dL (ref 6.4–8.3)

## 2016-03-25 MED ORDER — HEPARIN SOD (PORK) LOCK FLUSH 100 UNIT/ML IV SOLN
500.0000 [IU] | Freq: Once | INTRAVENOUS | Status: AC | PRN
  Administered 2016-03-25: 500 [IU]
  Filled 2016-03-25: qty 5

## 2016-03-25 MED ORDER — INFLUENZA VAC SPLIT QUAD 0.5 ML IM SUSY
0.5000 mL | PREFILLED_SYRINGE | Freq: Once | INTRAMUSCULAR | Status: AC
  Administered 2016-03-25: 0.5 mL via INTRAMUSCULAR
  Filled 2016-03-25: qty 0.5

## 2016-03-25 MED ORDER — PROCHLORPERAZINE MALEATE 10 MG PO TABS: 10.0000 mg | ORAL_TABLET | Freq: Four times a day (QID) | ORAL | 0 refills | Status: AC | PRN

## 2016-03-25 MED ORDER — PALONOSETRON HCL INJECTION 0.25 MG/5ML
0.2500 mg | Freq: Once | INTRAVENOUS | Status: AC
  Administered 2016-03-25: 0.25 mg via INTRAVENOUS

## 2016-03-25 MED ORDER — SODIUM CHLORIDE 0.9 % IV SOLN
300.0000 mg/m2 | Freq: Once | INTRAVENOUS | Status: AC
  Administered 2016-03-25: 620 mg via INTRAVENOUS
  Filled 2016-03-25: qty 31

## 2016-03-25 MED ORDER — SODIUM CHLORIDE 0.9 % IV SOLN
10.0000 mg | Freq: Once | INTRAVENOUS | Status: AC
  Administered 2016-03-25: 10 mg via INTRAVENOUS
  Filled 2016-03-25: qty 1

## 2016-03-25 MED ORDER — DEXTROSE 5 % IV SOLN
27.0000 mg/m2 | Freq: Once | INTRAVENOUS | Status: AC
  Administered 2016-03-25: 56 mg via INTRAVENOUS
  Filled 2016-03-25: qty 28

## 2016-03-25 MED ORDER — PALONOSETRON HCL INJECTION 0.25 MG/5ML
INTRAVENOUS | Status: AC
  Filled 2016-03-25: qty 5

## 2016-03-25 MED ORDER — SODIUM CHLORIDE 0.9% FLUSH
10.0000 mL | INTRAVENOUS | Status: DC | PRN
  Administered 2016-03-25: 10 mL
  Filled 2016-03-25: qty 10

## 2016-03-25 MED ORDER — SODIUM CHLORIDE 0.9 % IV SOLN: Freq: Once | INTRAVENOUS | Status: DC

## 2016-03-25 MED ORDER — SODIUM CHLORIDE 0.9 % IV SOLN
Freq: Once | INTRAVENOUS | Status: AC
  Administered 2016-03-25: 11:00:00 via INTRAVENOUS

## 2016-03-25 NOTE — Patient Instructions (Signed)
[facility] Discharge Instructions for Patients Receiving Chemotherapy  Today you received the following chemotherapy agents Kyprolis & Cytoxan  To help prevent nausea and vomiting after your treatment, we encourage you to take your nausea medication as directed.   If you develop nausea and vomiting that is not controlled by your nausea medication, call the clinic.   BELOW ARE SYMPTOMS THAT SHOULD BE REPORTED IMMEDIATELY:  *FEVER GREATER THAN 100.5 F  *CHILLS WITH OR WITHOUT FEVER  NAUSEA AND VOMITING THAT IS NOT CONTROLLED WITH YOUR NAUSEA MEDICATION  *UNUSUAL SHORTNESS OF BREATH  *UNUSUAL BRUISING OR BLEEDING  TENDERNESS IN MOUTH AND THROAT WITH OR WITHOUT PRESENCE OF ULCERS  *URINARY PROBLEMS  *BOWEL PROBLEMS  UNUSUAL RASH Items with * indicate a potential emergency and should be followed up as soon as possible.  Feel free to call the clinic you have any questions or concerns. The clinic phone number is [phone].  Please show the [place] at check-in to the Emergency Department and triage nurse.  Carfilzomib injection What is this medicine? CARFILZOMIB (kar FILZ oh mib) targets a specific protein within cancer cells and stops the cancer cells from growing. It is used to treat multiple myeloma. This medicine may be used for other purposes; ask your health care provider or pharmacist if you have questions. What should I tell my health care provider before I take this medicine? They need to know if you have any of these conditions: -heart disease -history of blood clots -irregular heartbeat -kidney disease -liver disease -lung or breathing disease -an unusual or allergic reaction to carfilzomib, or other medicines, foods, dyes, or preservatives -pregnant or trying to get pregnant -breast-feeding How should I use this medicine? This medicine is for injection or infusion into a vein. It is given by a health care professional in a hospital or  clinic setting. Talk to your pediatrician regarding the use of this medicine in children. Special care may be needed. Overdosage: If you think you have taken too much of this medicine contact a poison control center or emergency room at once. NOTE: This medicine is only for you. Do not share this medicine with others. What if I miss a dose? It is important not to miss your dose. Call your doctor or health care professional if you are unable to keep an appointment. What may interact with this medicine? Interactions are not expected. Give your health care provider a list of all the medicines, herbs, non-prescription drugs, or dietary supplements you use. Also tell them if you smoke, drink alcohol, or use illegal drugs. Some items may interact with your medicine. This list may not describe all possible interactions. Give your health care provider a list of all the medicines, herbs, non-prescription drugs, or dietary supplements you use. Also tell them if you smoke, drink alcohol, or use illegal drugs. Some items may interact with your medicine. What should I watch for while using this medicine? Your condition will be monitored carefully while you are receiving this medicine. Report any side effects. Continue your course of treatment even though you feel ill unless your doctor tells you to stop. You may need blood work done while you are taking this medicine. Do not become pregnant while taking this medicine or for at least 30 days after stopping it. Women should inform their doctor if they wish to become pregnant or think they might be pregnant. There is a potential for serious side effects to an unborn child. Men should not  father a child while taking this medicine and for 90 days after stopping it. Talk to your health care professional or pharmacist for more information. Do not breast-feed an infant while taking this medicine. Check with your doctor or health care professional if you get an attack of  severe diarrhea, nausea and vomiting, or if you sweat a lot. The loss of too much body fluid can make it dangerous for you to take this medicine. You may get dizzy. Do not drive, use machinery, or do anything that needs mental alertness until you know how this medicine affects you. Do not stand or sit up quickly, especially if you are an older patient. This reduces the risk of dizzy or fainting spells. What side effects may I notice from receiving this medicine? Side effects that you should report to your doctor or health care professional as soon as possible: -allergic reactions like skin rash, itching or hives, swelling of the face, lips, or tongue -confusion -dizziness -feeling faint or lightheaded -fever or chills -palpitations -seizures -signs and symptoms of bleeding such as bloody or black, tarry stools; red or dark-brown urine; spitting up blood or brown material that looks like coffee grounds; red spots on the skin; unusual bruising or bleeding including from the eye, gums, or nose -signs and symptoms of a blood clot such as breathing problems; changes in vision; chest pain; severe, sudden headache; pain, swelling, warmth in the leg; trouble speaking; sudden numbness or weakness of the face, arm or leg -signs and symptoms of kidney injury like trouble passing urine or change in the amount of urine -signs and symptoms of liver injury like dark yellow or brown urine; general ill feeling or flu-like symptoms; light-colored stools; loss of appetite; nausea; right upper belly pain; unusually weak or tired; yellowing of the eyes or skin Side effects that usually do not require medical attention (report to your doctor or health care professional if they continue or are bothersome): -back pain -cough -diarrhea -headache -muscle cramps -vomiting This list may not describe all possible side effects. Call your doctor for medical advice about side effects. You may report side effects to FDA at  1-800-FDA-1088. Where should I keep my medicine? This drug is given in a hospital or clinic and will not be stored at home. NOTE: This sheet is a summary. It may not cover all possible information. If you have questions about this medicine, talk to your doctor, pharmacist, or health care provider.    2016, Elsevier/Gold Standard. (2015-02-07 16:16:00)  Cyclophosphamide injection What is this medicine? CYCLOPHOSPHAMIDE (sye kloe FOSS fa mide) is a chemotherapy drug. It slows the growth of cancer cells. This medicine is used to treat many types of cancer like lymphoma, myeloma, leukemia, breast cancer, and ovarian cancer, to name a few. This medicine may be used for other purposes; ask your health care provider or pharmacist if you have questions. What should I tell my health care provider before I take this medicine? They need to know if you have any of these conditions: -blood disorders -history of other chemotherapy -infection -kidney disease -liver disease -recent or ongoing radiation therapy -tumors in the bone marrow -an unusual or allergic reaction to cyclophosphamide, other chemotherapy, other medicines, foods, dyes, or preservatives -pregnant or trying to get pregnant -breast-feeding How should I use this medicine? This drug is usually given as an injection into a vein or muscle or by infusion into a vein. It is administered in a hospital or clinic by a specially trained  health care professional. Talk to your pediatrician regarding the use of this medicine in children. Special care may be needed. Overdosage: If you think you have taken too much of this medicine contact a poison control center or emergency room at once. NOTE: This medicine is only for you. Do not share this medicine with others. What if I miss a dose? It is important not to miss your dose. Call your doctor or health care professional if you are unable to keep an appointment. What may interact with this  medicine? This medicine may interact with the following medications: -amiodarone -amphotericin B -azathioprine -certain antiviral medicines for HIV or AIDS such as protease inhibitors (e.g., indinavir, ritonavir) and zidovudine -certain blood pressure medications such as benazepril, captopril, enalapril, fosinopril, lisinopril, moexipril, monopril, perindopril, quinapril, ramipril, trandolapril -certain cancer medications such as anthracyclines (e.g., daunorubicin, doxorubicin), busulfan, cytarabine, paclitaxel, pentostatin, tamoxifen, trastuzumab -certain diuretics such as chlorothiazide, chlorthalidone, hydrochlorothiazide, indapamide, metolazone -certain medicines that treat or prevent blood clots like warfarin -certain muscle relaxants such as succinylcholine -cyclosporine -etanercept -indomethacin -medicines to increase blood counts like filgrastim, pegfilgrastim, sargramostim -medicines used as general anesthesia -metronidazole -natalizumab This list may not describe all possible interactions. Give your health care provider a list of all the medicines, herbs, non-prescription drugs, or dietary supplements you use. Also tell them if you smoke, drink alcohol, or use illegal drugs. Some items may interact with your medicine. What should I watch for while using this medicine? Visit your doctor for checks on your progress. This drug may make you feel generally unwell. This is not uncommon, as chemotherapy can affect healthy cells as well as cancer cells. Report any side effects. Continue your course of treatment even though you feel ill unless your doctor tells you to stop. Drink water or other fluids as directed. Urinate often, even at night. In some cases, you may be given additional medicines to help with side effects. Follow all directions for their use. Call your doctor or health care professional for advice if you get a fever, chills or sore throat, or other symptoms of a cold or flu.  Do not treat yourself. This drug decreases your body's ability to fight infections. Try to avoid being around people who are sick. This medicine may increase your risk to bruise or bleed. Call your doctor or health care professional if you notice any unusual bleeding. Be careful brushing and flossing your teeth or using a toothpick because you may get an infection or bleed more easily. If you have any dental work done, tell your dentist you are receiving this medicine. You may get drowsy or dizzy. Do not drive, use machinery, or do anything that needs mental alertness until you know how this medicine affects you. Do not become pregnant while taking this medicine or for 1 year after stopping it. Women should inform their doctor if they wish to become pregnant or think they might be pregnant. Men should not father a child while taking this medicine and for 4 months after stopping it. There is a potential for serious side effects to an unborn child. Talk to your health care professional or pharmacist for more information. Do not breast-feed an infant while taking this medicine. This medicine may interfere with the ability to have a child. This medicine has caused ovarian failure in some women. This medicine has caused reduced sperm counts in some men. You should talk with your doctor or health care professional if you are concerned about your fertility. If you are  going to have surgery, tell your doctor or health care professional that you have taken this medicine. What side effects may I notice from receiving this medicine? Side effects that you should report to your doctor or health care professional as soon as possible: -allergic reactions like skin rash, itching or hives, swelling of the face, lips, or tongue -low blood counts - this medicine may decrease the number of white blood cells, red blood cells and platelets. You may be at increased risk for infections and bleeding. -signs of infection - fever  or chills, cough, sore throat, pain or difficulty passing urine -signs of decreased platelets or bleeding - bruising, pinpoint red spots on the skin, black, tarry stools, blood in the urine -signs of decreased red blood cells - unusually weak or tired, fainting spells, lightheadedness -breathing problems -dark urine -dizziness -palpitations -swelling of the ankles, feet, hands -trouble passing urine or change in the amount of urine -weight gain -yellowing of the eyes or skin Side effects that usually do not require medical attention (report to your doctor or health care professional if they continue or are bothersome): -changes in nail or skin color -hair loss -missed menstrual periods -mouth sores -nausea, vomiting This list may not describe all possible side effects. Call your doctor for medical advice about side effects. You may report side effects to FDA at 1-800-FDA-1088. Where should I keep my medicine? This drug is given in a hospital or clinic and will not be stored at home. NOTE: This sheet is a summary. It may not cover all possible information. If you have questions about this medicine, talk to your doctor, pharmacist, or health care provider.    2016, Elsevier/Gold Standard. (2012-05-01 16:22:58)

## 2016-03-25 NOTE — Progress Notes (Signed)
Patient confirms they took steroid at home prior to visit.

## 2016-03-25 NOTE — Telephone Encounter (Signed)
PER PT DTR ADJUSTED APPOINTMENTS TIME AND GAVE DTR NEW SCHEDULE FOR September AND October

## 2016-03-26 ENCOUNTER — Other Ambulatory Visit: Payer: Self-pay

## 2016-03-26 ENCOUNTER — Ambulatory Visit (HOSPITAL_BASED_OUTPATIENT_CLINIC_OR_DEPARTMENT_OTHER): Payer: Medicare Other

## 2016-03-26 VITALS — BP 136/68 | HR 76 | Temp 98.2°F | Resp 17

## 2016-03-26 DIAGNOSIS — Z5112 Encounter for antineoplastic immunotherapy: Secondary | ICD-10-CM | POA: Diagnosis not present

## 2016-03-26 DIAGNOSIS — C9002 Multiple myeloma in relapse: Secondary | ICD-10-CM | POA: Diagnosis not present

## 2016-03-26 MED ORDER — HEPARIN SOD (PORK) LOCK FLUSH 100 UNIT/ML IV SOLN
500.0000 [IU] | Freq: Once | INTRAVENOUS | Status: AC | PRN
  Administered 2016-03-26: 500 [IU]
  Filled 2016-03-26: qty 5

## 2016-03-26 MED ORDER — SODIUM CHLORIDE 0.9 % IV SOLN
Freq: Once | INTRAVENOUS | Status: AC
  Administered 2016-03-26: 09:00:00 via INTRAVENOUS

## 2016-03-26 MED ORDER — SODIUM CHLORIDE 0.9% FLUSH
10.0000 mL | INTRAVENOUS | Status: DC | PRN
  Administered 2016-03-26: 10 mL
  Filled 2016-03-26: qty 10

## 2016-03-26 MED ORDER — CARFILZOMIB CHEMO INJECTION 60 MG
27.0000 mg/m2 | Freq: Once | INTRAVENOUS | Status: AC
  Administered 2016-03-26: 56 mg via INTRAVENOUS
  Filled 2016-03-26: qty 28

## 2016-03-26 MED ORDER — SODIUM CHLORIDE 0.9 % IV SOLN
10.0000 mg | Freq: Once | INTRAVENOUS | Status: AC
  Administered 2016-03-26: 10 mg via INTRAVENOUS
  Filled 2016-03-26: qty 1

## 2016-03-26 NOTE — Patient Instructions (Signed)
[facility] Discharge Instructions for Patients Receiving Chemotherapy  Today you received the following chemotherapy agents:  Kyprolis (carfilzomib)  To help prevent nausea and vomiting after your treatment, we encourage you to take your nausea medication as prescribed.   If you develop nausea and vomiting that is not controlled by your nausea medication, call the clinic.   BELOW ARE SYMPTOMS THAT SHOULD BE REPORTED IMMEDIATELY:  *FEVER GREATER THAN 100.5 F  *CHILLS WITH OR WITHOUT FEVER  NAUSEA AND VOMITING THAT IS NOT CONTROLLED WITH YOUR NAUSEA MEDICATION  *UNUSUAL SHORTNESS OF BREATH  *UNUSUAL BRUISING OR BLEEDING  TENDERNESS IN MOUTH AND THROAT WITH OR WITHOUT PRESENCE OF ULCERS  *URINARY PROBLEMS  *BOWEL PROBLEMS  UNUSUAL RASH Items with * indicate a potential emergency and should be followed up as soon as possible.  Feel free to call the clinic you have any questions or concerns. The clinic phone number is [phone].  Please show the [place] at check-in to the Emergency Department and triage nurse.

## 2016-03-27 ENCOUNTER — Other Ambulatory Visit: Payer: Self-pay | Admitting: Medical Oncology

## 2016-03-27 ENCOUNTER — Other Ambulatory Visit: Payer: Self-pay | Admitting: Internal Medicine

## 2016-03-27 ENCOUNTER — Telehealth: Payer: Self-pay | Admitting: *Deleted

## 2016-03-27 ENCOUNTER — Other Ambulatory Visit: Payer: Self-pay | Admitting: Physician Assistant

## 2016-03-27 NOTE — Telephone Encounter (Signed)
TC from patient this morning asking about her schedule for decadron. She had been taking it every Friday but her treatment plan has changed and she is back on IV treatment for her multiple myeloma. Should pt take decadron on Friday? Please Advise asap.

## 2016-03-27 NOTE — Telephone Encounter (Signed)
I told pt she still needs to take decadron 20 mg every friday

## 2016-04-01 ENCOUNTER — Other Ambulatory Visit (HOSPITAL_BASED_OUTPATIENT_CLINIC_OR_DEPARTMENT_OTHER): Payer: Medicare Other

## 2016-04-01 ENCOUNTER — Ambulatory Visit (HOSPITAL_BASED_OUTPATIENT_CLINIC_OR_DEPARTMENT_OTHER): Payer: Medicare Other

## 2016-04-01 VITALS — BP 128/63 | HR 73 | Temp 98.3°F | Resp 18

## 2016-04-01 DIAGNOSIS — C9002 Multiple myeloma in relapse: Secondary | ICD-10-CM

## 2016-04-01 DIAGNOSIS — Z5112 Encounter for antineoplastic immunotherapy: Secondary | ICD-10-CM | POA: Diagnosis not present

## 2016-04-01 DIAGNOSIS — Z5111 Encounter for antineoplastic chemotherapy: Secondary | ICD-10-CM | POA: Diagnosis not present

## 2016-04-01 LAB — CBC WITH DIFFERENTIAL/PLATELET
BASO%: 0.2 % (ref 0.0–2.0)
Basophils Absolute: 0 10*3/uL (ref 0.0–0.1)
EOS%: 1.7 % (ref 0.0–7.0)
Eosinophils Absolute: 0.1 10*3/uL (ref 0.0–0.5)
HEMATOCRIT: 30.6 % — AB (ref 34.8–46.6)
HGB: 9.9 g/dL — ABNORMAL LOW (ref 11.6–15.9)
LYMPH#: 1.2 10*3/uL (ref 0.9–3.3)
LYMPH%: 25.9 % (ref 14.0–49.7)
MCH: 33.2 pg (ref 25.1–34.0)
MCHC: 32.4 g/dL (ref 31.5–36.0)
MCV: 102.7 fL — ABNORMAL HIGH (ref 79.5–101.0)
MONO#: 0.7 10*3/uL (ref 0.1–0.9)
MONO%: 14.5 % — ABNORMAL HIGH (ref 0.0–14.0)
NEUT%: 57.7 % (ref 38.4–76.8)
NEUTROS ABS: 2.7 10*3/uL (ref 1.5–6.5)
PLATELETS: 139 10*3/uL — AB (ref 145–400)
RBC: 2.98 10*6/uL — AB (ref 3.70–5.45)
RDW: 16.2 % — ABNORMAL HIGH (ref 11.2–14.5)
WBC: 4.8 10*3/uL (ref 3.9–10.3)

## 2016-04-01 LAB — COMPREHENSIVE METABOLIC PANEL
ALT: 19 U/L (ref 0–55)
ANION GAP: 9 meq/L (ref 3–11)
AST: 11 U/L (ref 5–34)
Albumin: 2.9 g/dL — ABNORMAL LOW (ref 3.5–5.0)
Alkaline Phosphatase: 75 U/L (ref 40–150)
BILIRUBIN TOTAL: 0.73 mg/dL (ref 0.20–1.20)
BUN: 12.2 mg/dL (ref 7.0–26.0)
CALCIUM: 8.8 mg/dL (ref 8.4–10.4)
CO2: 23 meq/L (ref 22–29)
CREATININE: 0.9 mg/dL (ref 0.6–1.1)
Chloride: 109 mEq/L (ref 98–109)
EGFR: 80 mL/min/{1.73_m2} — ABNORMAL LOW (ref >90)
Glucose: 110 mg/dl (ref 70–140)
Potassium: 3.5 mEq/L (ref 3.5–5.1)
Sodium: 141 mEq/L (ref 136–145)
TOTAL PROTEIN: 6.6 g/dL (ref 6.4–8.3)

## 2016-04-01 MED ORDER — DEXTROSE 5 % IV SOLN
27.0000 mg/m2 | Freq: Once | INTRAVENOUS | Status: AC
  Administered 2016-04-01: 56 mg via INTRAVENOUS
  Filled 2016-04-01: qty 28

## 2016-04-01 MED ORDER — SODIUM CHLORIDE 0.9 % IV SOLN
Freq: Once | INTRAVENOUS | Status: AC
  Administered 2016-04-01: 11:00:00 via INTRAVENOUS

## 2016-04-01 MED ORDER — SODIUM CHLORIDE 0.9% FLUSH
10.0000 mL | INTRAVENOUS | Status: DC | PRN
  Administered 2016-04-01: 10 mL
  Filled 2016-04-01: qty 10

## 2016-04-01 MED ORDER — SODIUM CHLORIDE 0.9 % IV SOLN
10.0000 mg | Freq: Once | INTRAVENOUS | Status: AC
  Administered 2016-04-01: 10 mg via INTRAVENOUS
  Filled 2016-04-01: qty 1

## 2016-04-01 MED ORDER — SODIUM CHLORIDE 0.9 % IV SOLN
300.0000 mg/m2 | Freq: Once | INTRAVENOUS | Status: AC
  Administered 2016-04-01: 620 mg via INTRAVENOUS
  Filled 2016-04-01: qty 31

## 2016-04-01 MED ORDER — PALONOSETRON HCL INJECTION 0.25 MG/5ML
INTRAVENOUS | Status: AC
  Filled 2016-04-01: qty 5

## 2016-04-01 MED ORDER — HEPARIN SOD (PORK) LOCK FLUSH 100 UNIT/ML IV SOLN
500.0000 [IU] | Freq: Once | INTRAVENOUS | Status: AC | PRN
  Administered 2016-04-01: 500 [IU]
  Filled 2016-04-01: qty 5

## 2016-04-01 MED ORDER — PALONOSETRON HCL INJECTION 0.25 MG/5ML
0.2500 mg | Freq: Once | INTRAVENOUS | Status: AC
  Administered 2016-04-01: 0.25 mg via INTRAVENOUS

## 2016-04-01 NOTE — Patient Instructions (Addendum)
[facility] Discharge Instructions for Patients Receiving Chemotherapy  Today you received the following chemotherapy agents: Kyprolis & Cytoxan.  To help prevent nausea and vomiting after your treatment, we encourage you to take your nausea medication as directed.    If you develop nausea and vomiting that is not controlled by your nausea medication, call the clinic.   BELOW ARE SYMPTOMS THAT SHOULD BE REPORTED IMMEDIATELY:  *FEVER GREATER THAN 100.5 F  *CHILLS WITH OR WITHOUT FEVER  NAUSEA AND VOMITING THAT IS NOT CONTROLLED WITH YOUR NAUSEA MEDICATION  *UNUSUAL SHORTNESS OF BREATH  *UNUSUAL BRUISING OR BLEEDING  TENDERNESS IN MOUTH AND THROAT WITH OR WITHOUT PRESENCE OF ULCERS  *URINARY PROBLEMS  *BOWEL PROBLEMS  UNUSUAL RASH Items with * indicate a potential emergency and should be followed up as soon as possible.  Feel free to call the clinic you have any questions or concerns. The clinic phone number is [phone].  Please show the CHEMO ALERT CARD at check-in to the Emergency Department and triage nurse.   

## 2016-04-02 ENCOUNTER — Other Ambulatory Visit: Payer: Self-pay

## 2016-04-02 ENCOUNTER — Other Ambulatory Visit: Payer: Self-pay | Admitting: Internal Medicine

## 2016-04-02 ENCOUNTER — Ambulatory Visit (HOSPITAL_BASED_OUTPATIENT_CLINIC_OR_DEPARTMENT_OTHER): Payer: Medicare Other

## 2016-04-02 VITALS — BP 128/61 | HR 86 | Temp 98.2°F | Resp 18

## 2016-04-02 DIAGNOSIS — C9002 Multiple myeloma in relapse: Secondary | ICD-10-CM | POA: Diagnosis not present

## 2016-04-02 DIAGNOSIS — Z5112 Encounter for antineoplastic immunotherapy: Secondary | ICD-10-CM

## 2016-04-02 MED ORDER — DEXAMETHASONE SODIUM PHOSPHATE 100 MG/10ML IJ SOLN
10.0000 mg | Freq: Once | INTRAMUSCULAR | Status: AC
  Administered 2016-04-02: 10 mg via INTRAVENOUS
  Filled 2016-04-02: qty 1

## 2016-04-02 MED ORDER — SODIUM CHLORIDE 0.9% FLUSH
10.0000 mL | INTRAVENOUS | Status: DC | PRN
  Administered 2016-04-02: 10 mL
  Filled 2016-04-02: qty 10

## 2016-04-02 MED ORDER — SODIUM CHLORIDE 0.9 % IV SOLN
Freq: Once | INTRAVENOUS | Status: AC
  Administered 2016-04-02: 09:00:00 via INTRAVENOUS

## 2016-04-02 MED ORDER — HEPARIN SOD (PORK) LOCK FLUSH 100 UNIT/ML IV SOLN
500.0000 [IU] | Freq: Once | INTRAVENOUS | Status: AC | PRN
  Administered 2016-04-02: 500 [IU]
  Filled 2016-04-02: qty 5

## 2016-04-02 MED ORDER — DEXTROSE 5 % IV SOLN
27.0000 mg/m2 | Freq: Once | INTRAVENOUS | Status: AC
  Administered 2016-04-02: 56 mg via INTRAVENOUS
  Filled 2016-04-02: qty 28

## 2016-04-02 NOTE — Patient Instructions (Signed)
[facility] Discharge Instructions for Patients Receiving Chemotherapy  Today you received the following chemotherapy agents: Kyprolis   To help prevent nausea and vomiting after your treatment, we encourage you to take your nausea medication as directed.    If you develop nausea and vomiting that is not controlled by your nausea medication, call the clinic.   BELOW ARE SYMPTOMS THAT SHOULD BE REPORTED IMMEDIATELY:  *FEVER GREATER THAN 100.5 F  *CHILLS WITH OR WITHOUT FEVER  NAUSEA AND VOMITING THAT IS NOT CONTROLLED WITH YOUR NAUSEA MEDICATION  *UNUSUAL SHORTNESS OF BREATH  *UNUSUAL BRUISING OR BLEEDING  TENDERNESS IN MOUTH AND THROAT WITH OR WITHOUT PRESENCE OF ULCERS  *URINARY PROBLEMS  *BOWEL PROBLEMS  UNUSUAL RASH Items with * indicate a potential emergency and should be followed up as soon as possible.  Feel free to call the clinic you have any questions or concerns. The clinic phone number is [phone].  Please show the CHEMO ALERT CARD at check-in to the Emergency Department and triage nurse.   

## 2016-04-03 ENCOUNTER — Other Ambulatory Visit: Payer: Self-pay | Admitting: *Deleted

## 2016-04-03 MED ORDER — POTASSIUM CHLORIDE ER 10 MEQ PO TBCR: EXTENDED_RELEASE_TABLET | ORAL | 1 refills | Status: AC

## 2016-04-08 ENCOUNTER — Encounter: Payer: Self-pay | Admitting: Nurse Practitioner

## 2016-04-08 ENCOUNTER — Ambulatory Visit (HOSPITAL_BASED_OUTPATIENT_CLINIC_OR_DEPARTMENT_OTHER): Payer: Medicare Other

## 2016-04-08 ENCOUNTER — Ambulatory Visit (HOSPITAL_BASED_OUTPATIENT_CLINIC_OR_DEPARTMENT_OTHER): Payer: Medicare Other | Admitting: Nurse Practitioner

## 2016-04-08 ENCOUNTER — Other Ambulatory Visit (HOSPITAL_BASED_OUTPATIENT_CLINIC_OR_DEPARTMENT_OTHER): Payer: Medicare Other

## 2016-04-08 VITALS — BP 124/72 | HR 89 | Temp 98.6°F | Resp 16

## 2016-04-08 DIAGNOSIS — R112 Nausea with vomiting, unspecified: Secondary | ICD-10-CM | POA: Diagnosis not present

## 2016-04-08 DIAGNOSIS — Z5112 Encounter for antineoplastic immunotherapy: Secondary | ICD-10-CM

## 2016-04-08 DIAGNOSIS — E860 Dehydration: Secondary | ICD-10-CM | POA: Diagnosis not present

## 2016-04-08 DIAGNOSIS — C9002 Multiple myeloma in relapse: Secondary | ICD-10-CM

## 2016-04-08 DIAGNOSIS — Z5111 Encounter for antineoplastic chemotherapy: Secondary | ICD-10-CM | POA: Diagnosis not present

## 2016-04-08 DIAGNOSIS — R197 Diarrhea, unspecified: Secondary | ICD-10-CM | POA: Diagnosis not present

## 2016-04-08 LAB — COMPREHENSIVE METABOLIC PANEL
ALBUMIN: 3.1 g/dL — AB (ref 3.5–5.0)
ALK PHOS: 103 U/L (ref 40–150)
ALT: 20 U/L (ref 0–55)
AST: 14 U/L (ref 5–34)
Anion Gap: 8 mEq/L (ref 3–11)
BILIRUBIN TOTAL: 0.56 mg/dL (ref 0.20–1.20)
BUN: 16 mg/dL (ref 7.0–26.0)
CALCIUM: 9.2 mg/dL (ref 8.4–10.4)
CO2: 24 mEq/L (ref 22–29)
Chloride: 109 mEq/L (ref 98–109)
Creatinine: 1.1 mg/dL (ref 0.6–1.1)
EGFR: 60 mL/min/{1.73_m2} — AB (ref >90)
GLUCOSE: 68 mg/dL — AB (ref 70–140)
POTASSIUM: 3.9 meq/L (ref 3.5–5.1)
Sodium: 141 mEq/L (ref 136–145)
TOTAL PROTEIN: 7.1 g/dL (ref 6.4–8.3)

## 2016-04-08 LAB — CBC WITH DIFFERENTIAL/PLATELET
BASO%: 0.3 % (ref 0.0–2.0)
BASOS ABS: 0 10*3/uL (ref 0.0–0.1)
EOS ABS: 0.1 10*3/uL (ref 0.0–0.5)
EOS%: 1.7 % (ref 0.0–7.0)
HEMATOCRIT: 33 % — AB (ref 34.8–46.6)
HEMOGLOBIN: 10.6 g/dL — AB (ref 11.6–15.9)
LYMPH#: 0.5 10*3/uL — AB (ref 0.9–3.3)
LYMPH%: 15.3 % (ref 14.0–49.7)
MCH: 33 pg (ref 25.1–34.0)
MCHC: 32.1 g/dL (ref 31.5–36.0)
MCV: 102.8 fL — AB (ref 79.5–101.0)
MONO#: 0.5 10*3/uL (ref 0.1–0.9)
MONO%: 13.8 % (ref 0.0–14.0)
NEUT%: 68.9 % (ref 38.4–76.8)
NEUTROS ABS: 2.4 10*3/uL (ref 1.5–6.5)
PLATELETS: 130 10*3/uL — AB (ref 145–400)
RBC: 3.21 10*6/uL — ABNORMAL LOW (ref 3.70–5.45)
RDW: 16.9 % — AB (ref 11.2–14.5)
WBC: 3.5 10*3/uL — AB (ref 3.9–10.3)

## 2016-04-08 MED ORDER — PALONOSETRON HCL INJECTION 0.25 MG/5ML
0.2500 mg | Freq: Once | INTRAVENOUS | Status: AC
  Administered 2016-04-08: 0.25 mg via INTRAVENOUS

## 2016-04-08 MED ORDER — SODIUM CHLORIDE 0.9 % IV SOLN
Freq: Once | INTRAVENOUS | Status: AC
  Administered 2016-04-08: 12:00:00 via INTRAVENOUS

## 2016-04-08 MED ORDER — SODIUM CHLORIDE 0.9 % IV SOLN
10.0000 mg | Freq: Once | INTRAVENOUS | Status: AC
  Administered 2016-04-08: 10 mg via INTRAVENOUS
  Filled 2016-04-08: qty 1

## 2016-04-08 MED ORDER — DEXTROSE 5 % IV SOLN
27.0000 mg/m2 | Freq: Once | INTRAVENOUS | Status: AC
  Administered 2016-04-08: 56 mg via INTRAVENOUS
  Filled 2016-04-08: qty 28

## 2016-04-08 MED ORDER — SODIUM CHLORIDE 0.9 % IV SOLN
Freq: Once | INTRAVENOUS | Status: AC
  Administered 2016-04-08: 11:00:00 via INTRAVENOUS

## 2016-04-08 MED ORDER — PALONOSETRON HCL INJECTION 0.25 MG/5ML
INTRAVENOUS | Status: AC
  Filled 2016-04-08: qty 5

## 2016-04-08 MED ORDER — HEPARIN SOD (PORK) LOCK FLUSH 100 UNIT/ML IV SOLN
500.0000 [IU] | Freq: Once | INTRAVENOUS | Status: AC | PRN
  Administered 2016-04-08: 500 [IU]
  Filled 2016-04-08: qty 5

## 2016-04-08 MED ORDER — SODIUM CHLORIDE 0.9 % IV SOLN
300.0000 mg/m2 | Freq: Once | INTRAVENOUS | Status: AC
  Administered 2016-04-08: 620 mg via INTRAVENOUS
  Filled 2016-04-08: qty 31

## 2016-04-08 MED ORDER — SODIUM CHLORIDE 0.9% FLUSH
10.0000 mL | INTRAVENOUS | Status: DC | PRN
  Administered 2016-04-08: 10 mL
  Filled 2016-04-08: qty 10

## 2016-04-08 NOTE — Patient Instructions (Addendum)
[facility] Discharge Instructions for Patients Receiving Chemotherapy  Today you received the following chemotherapy agents:  Kyprolis and Cytoxan.  To help prevent nausea and vomiting after your treatment, we encourage you to take your nausea medication as directed.   If you develop nausea and vomiting that is not controlled by your nausea medication, call the clinic.   BELOW ARE SYMPTOMS THAT SHOULD BE REPORTED IMMEDIATELY:  *FEVER GREATER THAN 100.5 F  *CHILLS WITH OR WITHOUT FEVER  NAUSEA AND VOMITING THAT IS NOT CONTROLLED WITH YOUR NAUSEA MEDICATION  *UNUSUAL SHORTNESS OF BREATH  *UNUSUAL BRUISING OR BLEEDING  TENDERNESS IN MOUTH AND THROAT WITH OR WITHOUT PRESENCE OF ULCERS  *URINARY PROBLEMS  *BOWEL PROBLEMS  UNUSUAL RASH Items with * indicate a potential emergency and should be followed up as soon as possible.  Feel free to call the clinic you have any questions or concerns. The clinic phone number is [phone].  Please show the [place] at check-in to the Emergency Department and triage nurse.   Dehydration, Adult Dehydration is a condition in which you do not have enough fluid or water in your body. It happens when you take in less fluid than you lose. Vital organs such as the kidneys, brain, and heart cannot function without a proper amount of fluids. Any loss of fluids from the body can cause dehydration.  Dehydration can range from mild to severe. This condition should be treated right away to help prevent it from becoming severe. CAUSES  This condition may be caused by:  Vomiting.  Diarrhea.  Excessive sweating, such as when exercising in hot or humid weather.  Not drinking enough fluid during strenuous exercise or during an illness.  Excessive urine output.  Fever.  Certain medicines. RISK FACTORS This condition is more likely to develop in:  People who are taking certain medicines that cause the body to lose excess  fluid (diuretics).   People who have a chronic illness, such as diabetes, that may increase urination.  Older adults.   People who live at high altitudes.   People who participate in endurance sports.  SYMPTOMS  Mild Dehydration  Thirst.  Dry lips.  Slightly dry mouth.  Dry, warm skin. Moderate Dehydration  Very dry mouth.   Muscle cramps.   Dark urine and decreased urine production.   Decreased tear production.   Headache.   Light-headedness, especially when you stand up from a sitting position.  Severe Dehydration  Changes in skin.   Cold and clammy skin.   Skin does not spring back quickly when lightly pinched and released.   Changes in body fluids.   Extreme thirst.   No tears.   Not able to sweat when body temperature is high, such as in hot weather.   Minimal urine production.   Changes in vital signs.   Rapid, weak pulse (more than 100 beats per minute when you are sitting still).   Rapid breathing.   Low blood pressure.   Other changes.   Sunken eyes.   Cold hands and feet.   Confusion.  Lethargy and difficulty being awakened.  Fainting (syncope).   Short-term weight loss.   Unconsciousness. DIAGNOSIS  This condition may be diagnosed based on your symptoms. You may also have tests to determine how severe your dehydration is. These tests may include:   Urine tests.   Blood tests.  TREATMENT  Treatment for this condition depends on the severity. Mild or moderate dehydration can often be  treated at home. Treatment should be started right away. Do not wait until dehydration becomes severe. Severe dehydration needs to be treated at the hospital. Treatment for Mild Dehydration  Drinking plenty of water to replace the fluid you have lost.   Replacing minerals in your blood (electrolytes) that you may have lost.  Treatment for Moderate Dehydration  Consuming oral rehydration solution  (ORS). Treatment for Severe Dehydration  Receiving fluid through an IV tube.   Receiving electrolyte solution through a feeding tube that is passed through your nose and into your stomach (nasogastric tube or NG tube).  Correcting any abnormalities in electrolytes. HOME CARE INSTRUCTIONS   Drink enough fluid to keep your urine clear or pale yellow.   Drink water or fluid slowly by taking small sips. You can also try sucking on ice cubes.  Have food or beverages that contain electrolytes. Examples include bananas and sports drinks.  Take over-the-counter and prescription medicines only as told by your health care provider.   Prepare ORS according to the manufacturer's instructions. Take sips of ORS every 5 minutes until your urine returns to normal.  If you have vomiting or diarrhea, continue to try to drink water, ORS, or both.   If you have diarrhea, avoid:   Beverages that contain caffeine.   Fruit juice.   Milk.   Carbonated soft drinks.  Do not take salt tablets. This can lead to the condition of having too much sodium in your body (hypernatremia).  SEEK MEDICAL CARE IF:  You cannot eat or drink without vomiting.  You have had moderate diarrhea during a period of more than 24 hours.  You have a fever. SEEK IMMEDIATE MEDICAL CARE IF:   You have extreme thirst.  You have severe diarrhea.  You have not urinated in 6-8 hours, or you have urinated only a small amount of very dark urine.  You have shriveled skin.  You are dizzy, confused, or both.   This information is not intended to replace advice given to you by your health care provider. Make sure you discuss any questions you have with your health care provider.   Document Released: 06/17/2005 Document Revised: 03/08/2015 Document Reviewed: 11/02/2014 Elsevier Interactive Patient Education [employer].

## 2016-04-08 NOTE — Progress Notes (Signed)
SYMPTOM MANAGEMENT CLINIC    Chief Complaint: Nausea, vomiting, diarrhea, dehydration  HPI:  [name] 65 y.o. female diagnosed with multiple myeloma with bone metastasis.  Patient is status post stem cell transplant in the past; and is currently undergoing cytoxan/kyprolis chemotherapy regimen.   No history exists.    Review of Systems  Gastrointestinal: Positive for diarrhea, nausea and vomiting.  All other systems reviewed and are negative.   Past Medical History:  Diagnosis Date  . Compression fracture 04/08/2007   pathologic compression fracture  . Constipation    takes oxycontin,vicodin  . Family history of anesthesia complication    "daughter gets PONV too"  . FHx: chemotherapy    s/p 5 cycle revlimid/low dose decadron,s/p velcade,doxil,decadron,  . GERD (gastroesophageal reflux disease)   . History of autologous stem cell transplant ([place]) 11/20/2007   [facility], Dr Valarie Merino  . Hx of radiation therapy 05/05/07-05/18/07,& 03/05/11-03/21/11-   l3&l5 in 2008, t2-t6 03/2011  . Hx of radiation therapy 05/05/2007 to 05/18/2007   palliative, L3-5  . Hx of radiation therapy 03/05/2011 to 03/21/2011   palliative T2-T6, c-spine  . Hypercholesterolemia   . Hypothyroidism   . Insomnia    associated with steroids  . Metastasis to bone ([place])   . Multiple myeloma ([place]) dx'd 2009  . Pneumonia    "several times"  . PONV (postoperative nausea and vomiting)   . Stroke [facility]) 2014   denies residual on 01/27/2014    Past Surgical History:  Procedure Laterality Date  . CHOLECYSTECTOMY  1980's  . KNEE ARTHROSCOPY Right    "put pin in"  . KNEE ARTHROSCOPY Right    "took pin out and corrected what was wrong"  . PORTACATH PLACEMENT Right 2009  . VAGINAL HYSTERECTOMY  1980's  . VIDEO BRONCHOSCOPY  07/30/2011   Procedure: VIDEO BRONCHOSCOPY WITHOUT FLUORO;  Surgeon: Kathee Delton, MD;  Location: [facility];  Service: Cardiopulmonary;  Laterality: Bilateral;    has Multiple  myeloma in relapse ([place]); Hypothyroidism; GERD; Essential hypertension; History of autologous stem cell transplant ([place]); Hyperlipidemia; Medication management; Vitamin D deficiency; CKD stage 3 due to type 2 diabetes mellitus ([place]); Pain and swelling of left lower extremity; Encounter for antineoplastic chemotherapy; Lytic bone lesions on xray; Dehydration; Peripheral edema; Nausea with vomiting; Bronchitis; Hypomagnesemia; Diarrhea; Dizzy spells; HCAP (healthcare-associated pneumonia); Weakness generalized; and Edema on her problem list.    has No Known Allergies.    Medication List       Accurate as of 04/08/16  1:59 PM. Always use your most recent med list.          ALPRAZolam 0.5 MG tablet Commonly known as:  XANAX TAKE 1/2 TO 1 TABLET 2 TO 3 TIMES DAILY AS NEEDED FOR ANXIETY   aspirin 81 MG tablet Take 81 mg by mouth as directed. Mondays, Wednesdays, and Fridays   blood glucose meter kit and supplies Test blood sugars once a day   citalopram 40 MG tablet Commonly known as:  CELEXA TAKE 1 TABLET (40 MG TOTAL) BY MOUTH DAILY.   cyclobenzaprine 10 MG tablet Commonly known as:  FLEXERIL TAKE 1 TABLET 3 TIMES A DAY AS NEEDED FOR MUSCLE SPASMS   dexamethasone 4 MG tablet Commonly known as:  DECADRON TAKE (10) TABLETS BY MOUTH EVERY FRIDAY   diphenoxylate-atropine 2.5-0.025 MG tablet Commonly known as:  LOMOTIL TAKE 2 TABLETS BY MOUTH 4 TIMES A DAY AS NEEDED FOR DIARRHEA   Eszopiclone 3 MG Tabs TAKE 1 TABLET BY MOUTH AT  BEDTIME AS NEEDED FOR SLEEP   furosemide 40 MG tablet Commonly known as:  LASIX TAKE 1 TABLET BY MOUTH TWICE A DAY FOR FLUID AND SWELLING   gabapentin 600 MG tablet Commonly known as:  NEURONTIN TAKE 1 TABLET 4 X DAILY AS NEEDED FOR PAIN OR CRAMPS   HYDROcodone-acetaminophen 7.5-325 MG tablet Commonly known as:  NORCO Take 1 tablet by mouth every 6 (six) hours as needed for moderate pain.   loperamide 2 MG capsule Commonly known as:  IMODIUM Take  2 mg by mouth as needed for diarrhea or loose stools. Reported on 10/02/2015   Magnesium 500 MG Tabs Take 500 mg by mouth 2 (two) times daily.   metolazone 2.5 MG tablet Commonly known as:  ZAROXOLYN   mometasone 50 MCG/ACT nasal spray Commonly known as:  NASONEX PLACE 2 SPRAYS INTO THE NOSE DAILY.   montelukast 10 MG tablet Commonly known as:  SINGULAIR TAKE 1 TABLET BY MOUTH EVERY DAY   morphine 15 MG 12 hr tablet Commonly known as:  MS CONTIN Take 1 tablet (15 mg total) by mouth every 12 (twelve) hours.   multivitamin with minerals Tabs tablet Take 1 tablet by mouth every morning.   ondansetron 8 MG disintegrating tablet Commonly known as:  ZOFRAN-ODT TAKE 1 TABLET BY MOUTH EVERY 8 HOURS AS NEEDED FOR NAUSEA AND VOMITING   pantoprazole 40 MG tablet Commonly known as:  PROTONIX TAKE 1 TABLET (40 MG TOTAL) BY MOUTH 2 (TWO) TIMES DAILY.   pantoprazole 40 MG tablet Commonly known as:  PROTONIX TAKE 1 TABLET (40 MG TOTAL) BY MOUTH 2 (TWO) TIMES DAILY.   potassium chloride 10 MEQ tablet Commonly known as:  KLOR-CON 10 Take 2 tablets by mouth 2 times daily.   prochlorperazine 10 MG tablet Commonly known as:  COMPAZINE Take 1 tablet (10 mg total) by mouth every 6 (six) hours as needed for nausea or vomiting.   SYNTHROID 100 MCG tablet Generic drug:  levothyroxine TAKE 1 TABLET BY MOUTH DAILY   traZODone 150 MG tablet Commonly known as:  DESYREL TAKE 1/2 TO 1 TABLET 1 HOUR BEFORE SLEEP.        PHYSICAL EXAMINATION  Oncology Vitals 04/08/2016 04/02/2016  Height - -  Weight - -  Weight (lbs) - -  BMI (kg/m2) - -  Temp 98.6 98.2  Pulse 89 86  Resp 16 18  Resp (Historical as of 01/30/12) - -  SpO2 100 100  BSA (m2) - -   BP Readings from Last 2 Encounters:  04/08/16 124/72  04/02/16 128/61    Physical Exam  Constitutional: She is oriented to person, place, and time and well-developed, well-nourished, and in no distress.  HENT:  Head: Normocephalic and  atraumatic.  Eyes: EOM are normal. Pupils are equal, round, and reactive to light.  Neck: Normal range of motion.  Pulmonary/Chest: Effort normal. No respiratory distress.  Musculoskeletal: Normal range of motion.  Neurological: She is alert and oriented to person, place, and time.  Skin: Skin is warm and dry.  Psychiatric: Affect normal.  Nursing note and vitals reviewed.   LABORATORY DATA:. Appointment on 04/08/2016  Component Date Value Ref Range Status  . WBC 04/08/2016 3.5* 3.9 - 10.3 10e3/uL Final  . NEUT# 04/08/2016 2.4  1.5 - 6.5 10e3/uL Final  . HGB 04/08/2016 10.6* 11.6 - 15.9 g/dL Final  . HCT 04/08/2016 33.0* 34.8 - 46.6 % Final  . Platelets 04/08/2016 130* 145 - 400 10e3/uL Final  . MCV 04/08/2016 102.8*  79.5 - 101.0 fL Final  . MCH 04/08/2016 33.0  25.1 - 34.0 pg Final  . MCHC 04/08/2016 32.1  31.5 - 36.0 g/dL Final  . RBC 04/08/2016 3.21* 3.70 - 5.45 10e6/uL Final  . RDW 04/08/2016 16.9* 11.2 - 14.5 % Final  . lymph# 04/08/2016 0.5* 0.9 - 3.3 10e3/uL Final  . MONO# 04/08/2016 0.5  0.1 - 0.9 10e3/uL Final  . Eosinophils Absolute 04/08/2016 0.1  0.0 - 0.5 10e3/uL Final  . Basophils Absolute 04/08/2016 0.0  0.0 - 0.1 10e3/uL Final  . NEUT% 04/08/2016 68.9  38.4 - 76.8 % Final  . LYMPH% 04/08/2016 15.3  14.0 - 49.7 % Final  . MONO% 04/08/2016 13.8  0.0 - 14.0 % Final  . EOS% 04/08/2016 1.7  0.0 - 7.0 % Final  . BASO% 04/08/2016 0.3  0.0 - 2.0 % Final  . Sodium 04/08/2016 141  136 - 145 mEq/L Final  . Potassium 04/08/2016 3.9  3.5 - 5.1 mEq/L Final  . Chloride 04/08/2016 109  98 - 109 mEq/L Final  . CO2 04/08/2016 24  22 - 29 mEq/L Final  . Glucose 04/08/2016 68* 70 - 140 mg/dl Final  . BUN 04/08/2016 16.0  7.0 - 26.0 mg/dL Final  . Creatinine 04/08/2016 1.1  0.6 - 1.1 mg/dL Final  . Total Bilirubin 04/08/2016 0.56  0.20 - 1.20 mg/dL Final  . Alkaline Phosphatase 04/08/2016 103  40 - 150 U/L Final  . AST 04/08/2016 14  5 - 34 U/L Final  . ALT 04/08/2016 20  0 - 55  U/L Final  . Total Protein 04/08/2016 7.1  6.4 - 8.3 g/dL Final  . Albumin 04/08/2016 3.1* 3.5 - 5.0 g/dL Final  . Calcium 04/08/2016 9.2  8.4 - 10.4 mg/dL Final  . Anion Gap 04/08/2016 8  3 - 11 mEq/L Final  . EGFR 04/08/2016 60* >90 ml/min/1.73 m2 Final    RADIOGRAPHIC STUDIES: No results found.  ASSESSMENT/PLAN:    Multiple myeloma in relapse [facility]) Patient presented for cycle 1, day 15 of her Cytoxan/Kyprolis chemotherapy regimen.  Patient complained of diarrhea; and some nausea and vomiting this past Friday, 04/05/2016.  See further notes for details of today's visit.  Patient will return tomorrow 04/09/2016 for day 16 of her chemotherapy regimen.  Labs were essentially normal today; with the exception of a low blood sugar at 68.  Confirmed the patient did have a snack today and had no new complaints.  She is scheduled to return for labs, visit, and her next cycle of chemotherapy on 04/22/2016.  Diarrhea Patient states she's been having chronic episodes of diarrhea.  She has been taking Imodium 1 tablet alternating with Lomotil 1 tablet as needed.  She feels a little dehydrated today as well.  Of note-patient has a history of chronic diarrhea and abdominal discomfort in the past.  Patient also states that she had some episodes of nausea/vomiting last week on Friday, 04/05/2016 as well.  Patient was advised that she can increase the Lomotil and the Imodium to 2 tablets each for maximum of 8 tablets of each of these 2 medications in a 24-hour period.  Also, patient will receive approximate 500 ML's normal saline IV fluid rehydration while the cancer center as well.  She was advised to push fluids is much as possible.  She knows to call/return if she continues to feel dehydrated.  Dehydration Patient states she's been having chronic episodes of diarrhea.  She has been taking Imodium 1 tablet alternating with Lomotil  1 tablet as needed.  She feels a little dehydrated today as well.  Of  note-patient has a history of chronic diarrhea and abdominal discomfort in the past.  Patient also states that she had some episodes of nausea/vomiting last week on Friday, 04/05/2016 as well.  Patient was advised that she can increase the Lomotil and the Imodium to 2 tablets each for maximum of 8 tablets of each of these 2 medications in a 24-hour period.  Also, patient will receive approximate 500 ML's normal saline IV fluid rehydration while the cancer center as well.  She was advised to push fluids is much as possible.  She knows to call/return if she continues to feel dehydrated.  Nausea with vomiting Patient states that she had some episodes of nausea and vomiting on Friday, 04/05/2016; but has no further nausea or vomiting.  Since that time.  She does have some anti--nausea medications at home to use on an as-needed basis.   Patient stated understanding of all instructions; and was in agreement with this plan of care. The patient knows to call the clinic with any problems, questions or concerns.   Total time spent with patient was 15 minutes;  with greater than 75 percent of that time spent in face to face counseling regarding patient's symptoms,  and coordination of care and follow up.  Disclaimer:This dictation was prepared with Dragon/digital dictation along with [employer]. Any transcriptional errors that result from this process are unintentional.  Drue Second, NP 04/08/2016

## 2016-04-08 NOTE — Assessment & Plan Note (Addendum)
Patient presented for cycle 1, day 15 of her Cytoxan/Kyprolis chemotherapy regimen.  Patient complained of diarrhea; and some nausea and vomiting this past Friday, 04/05/2016.  See further notes for details of today's visit.  Patient will return tomorrow 04/09/2016 for day 16 of her chemotherapy regimen.  Labs were essentially normal today; with the exception of a low blood sugar at 68.  Confirmed the patient did have a snack today and had no new complaints.  She is scheduled to return for labs, visit, and her next cycle of chemotherapy on 04/22/2016.

## 2016-04-08 NOTE — Assessment & Plan Note (Signed)
Patient states she's been having chronic episodes of diarrhea.  She has been taking Imodium 1 tablet alternating with Lomotil 1 tablet as needed.  She feels a little dehydrated today as well.  Of note-patient has a history of chronic diarrhea and abdominal discomfort in the past.  Patient also states that she had some episodes of nausea/vomiting last week on Friday, 04/05/2016 as well.  Patient was advised that she can increase the Lomotil and the Imodium to 2 tablets each for maximum of 8 tablets of each of these 2 medications in a 24-hour period.  Also, patient will receive approximate 500 ML's normal saline IV fluid rehydration while the cancer center as well.  She was advised to push fluids is much as possible.  She knows to call/return if she continues to feel dehydrated.

## 2016-04-08 NOTE — Assessment & Plan Note (Signed)
Patient states that she had some episodes of nausea and vomiting on Friday, 04/05/2016; but has no further nausea or vomiting.  Since that time.  She does have some anti--nausea medications at home to use on an as-needed basis.

## 2016-04-09 ENCOUNTER — Ambulatory Visit (HOSPITAL_BASED_OUTPATIENT_CLINIC_OR_DEPARTMENT_OTHER): Payer: Medicare Other

## 2016-04-09 ENCOUNTER — Other Ambulatory Visit: Payer: Self-pay

## 2016-04-09 VITALS — BP 145/67 | HR 79 | Temp 99.0°F | Resp 18

## 2016-04-09 DIAGNOSIS — Z5112 Encounter for antineoplastic immunotherapy: Secondary | ICD-10-CM

## 2016-04-09 DIAGNOSIS — R079 Chest pain, unspecified: Secondary | ICD-10-CM

## 2016-04-09 DIAGNOSIS — C9002 Multiple myeloma in relapse: Secondary | ICD-10-CM | POA: Diagnosis not present

## 2016-04-09 MED ORDER — SODIUM CHLORIDE 0.9 % IV SOLN
Freq: Once | INTRAVENOUS | Status: AC
  Administered 2016-04-09: 08:00:00 via INTRAVENOUS

## 2016-04-09 MED ORDER — SODIUM CHLORIDE 0.9 % IV SOLN
10.0000 mg | Freq: Once | INTRAVENOUS | Status: AC
  Administered 2016-04-09: 10 mg via INTRAVENOUS
  Filled 2016-04-09: qty 1

## 2016-04-09 MED ORDER — HEPARIN SOD (PORK) LOCK FLUSH 100 UNIT/ML IV SOLN
500.0000 [IU] | Freq: Once | INTRAVENOUS | Status: AC | PRN
  Administered 2016-04-09: 500 [IU]
  Filled 2016-04-09: qty 5

## 2016-04-09 MED ORDER — DEXTROSE 5 % IV SOLN
27.0000 mg/m2 | Freq: Once | INTRAVENOUS | Status: AC
  Administered 2016-04-09: 56 mg via INTRAVENOUS
  Filled 2016-04-09: qty 28

## 2016-04-09 MED ORDER — SODIUM CHLORIDE 0.9 % IV SOLN
Freq: Once | INTRAVENOUS | Status: AC
  Administered 2016-04-09: 09:00:00 via INTRAVENOUS

## 2016-04-09 MED ORDER — SODIUM CHLORIDE 0.9% FLUSH
10.0000 mL | INTRAVENOUS | Status: DC | PRN
  Administered 2016-04-09: 10 mL
  Filled 2016-04-09: qty 10

## 2016-04-09 NOTE — Patient Instructions (Signed)
[facility] Discharge Instructions for Patients Receiving Chemotherapy  Today you received the following chemotherapy agents: Kyprolis   To help prevent nausea and vomiting after your treatment, we encourage you to take your nausea medication as directed.    If you develop nausea and vomiting that is not controlled by your nausea medication, call the clinic.   BELOW ARE SYMPTOMS THAT SHOULD BE REPORTED IMMEDIATELY:  *FEVER GREATER THAN 100.5 F  *CHILLS WITH OR WITHOUT FEVER  NAUSEA AND VOMITING THAT IS NOT CONTROLLED WITH YOUR NAUSEA MEDICATION  *UNUSUAL SHORTNESS OF BREATH  *UNUSUAL BRUISING OR BLEEDING  TENDERNESS IN MOUTH AND THROAT WITH OR WITHOUT PRESENCE OF ULCERS  *URINARY PROBLEMS  *BOWEL PROBLEMS  UNUSUAL RASH Items with * indicate a potential emergency and should be followed up as soon as possible.  Feel free to call the clinic you have any questions or concerns. The clinic phone number is [phone].  Please show the CHEMO ALERT CARD at check-in to the Emergency Department and triage nurse.   

## 2016-04-09 NOTE — Progress Notes (Signed)
0829: Pt c/o of Nausea, Decadron started 0843: pt requests that her B/P be retaken, B/P 140/66 pt appears slightly anxious of b/p reading.  pt states that she has a sharp pain in the center of chest (sternal region) that lasted approx 30 seconds. Pt states that this pain happens from time to time towards the left side of chest but this is the first time it has happened in the center. Pt states nausea has improved.  Diane RN aware whom will notify Dr. Julien Nordmann.  0853: Per Diane RN per Dr. Julien Nordmann pt to have EKG.  EKG performed by [facility] NT and results given to Dr. Julien Nordmann. No new orders at this time.  0950: Patient and vital signs stable at discharge.

## 2016-04-10 DIAGNOSIS — S32000S Wedge compression fracture of unspecified lumbar vertebra, sequela: Secondary | ICD-10-CM | POA: Diagnosis not present

## 2016-04-10 DIAGNOSIS — M47816 Spondylosis without myelopathy or radiculopathy, lumbar region: Secondary | ICD-10-CM | POA: Diagnosis not present

## 2016-04-10 DIAGNOSIS — C9001 Multiple myeloma in remission: Secondary | ICD-10-CM | POA: Diagnosis not present

## 2016-04-10 DIAGNOSIS — M4856XA Collapsed vertebra, not elsewhere classified, lumbar region, initial encounter for fracture: Secondary | ICD-10-CM | POA: Diagnosis not present

## 2016-04-10 DIAGNOSIS — M542 Cervicalgia: Secondary | ICD-10-CM | POA: Diagnosis not present

## 2016-04-10 DIAGNOSIS — T451X5A Adverse effect of antineoplastic and immunosuppressive drugs, initial encounter: Secondary | ICD-10-CM | POA: Diagnosis not present

## 2016-04-10 DIAGNOSIS — M5136 Other intervertebral disc degeneration, lumbar region: Secondary | ICD-10-CM | POA: Diagnosis not present

## 2016-04-10 DIAGNOSIS — C9000 Multiple myeloma not having achieved remission: Secondary | ICD-10-CM | POA: Diagnosis not present

## 2016-04-10 DIAGNOSIS — M47812 Spondylosis without myelopathy or radiculopathy, cervical region: Secondary | ICD-10-CM | POA: Diagnosis not present

## 2016-04-15 ENCOUNTER — Other Ambulatory Visit: Payer: Self-pay | Admitting: Physician Assistant

## 2016-04-18 ENCOUNTER — Ambulatory Visit (INDEPENDENT_AMBULATORY_CARE_PROVIDER_SITE_OTHER): Payer: Medicare Other | Admitting: Internal Medicine

## 2016-04-18 ENCOUNTER — Encounter: Payer: Self-pay | Admitting: Internal Medicine

## 2016-04-18 ENCOUNTER — Other Ambulatory Visit: Payer: Self-pay | Admitting: *Deleted

## 2016-04-18 VITALS — BP 124/66 | HR 90 | Temp 98.0°F | Resp 16 | Ht 64.75 in

## 2016-04-18 DIAGNOSIS — C9000 Multiple myeloma not having achieved remission: Secondary | ICD-10-CM

## 2016-04-18 DIAGNOSIS — E039 Hypothyroidism, unspecified: Secondary | ICD-10-CM | POA: Diagnosis not present

## 2016-04-18 DIAGNOSIS — Z5111 Encounter for antineoplastic chemotherapy: Secondary | ICD-10-CM

## 2016-04-18 DIAGNOSIS — J069 Acute upper respiratory infection, unspecified: Secondary | ICD-10-CM | POA: Diagnosis not present

## 2016-04-18 LAB — TSH: TSH: 8.41 m[IU]/L — AB

## 2016-04-18 MED ORDER — IPRATROPIUM-ALBUTEROL 0.5-2.5 (3) MG/3ML IN SOLN: 3.0000 mL | Freq: Once | RESPIRATORY_TRACT | Status: AC

## 2016-04-18 MED ORDER — HYDROCODONE-ACETAMINOPHEN 7.5-325 MG PO TABS: 1.0000 | ORAL_TABLET | Freq: Four times a day (QID) | ORAL | 0 refills | Status: DC | PRN

## 2016-04-18 MED ORDER — MORPHINE SULFATE ER 15 MG PO TBCR: 15.0000 mg | EXTENDED_RELEASE_TABLET | Freq: Two times a day (BID) | ORAL | 0 refills | Status: DC

## 2016-04-18 MED ORDER — ALBUTEROL SULFATE HFA 108 (90 BASE) MCG/ACT IN AERS: 2.0000 | INHALATION_SPRAY | RESPIRATORY_TRACT | 0 refills | Status: DC | PRN

## 2016-04-18 MED ORDER — ESZOPICLONE 3 MG PO TABS: ORAL_TABLET | ORAL | 2 refills | Status: AC

## 2016-04-18 MED ORDER — LEVOFLOXACIN 750 MG PO TABS: 750.0000 mg | ORAL_TABLET | Freq: Every day | ORAL | 0 refills | Status: DC

## 2016-04-18 MED ORDER — PREDNISONE 20 MG PO TABS: ORAL_TABLET | ORAL | 0 refills | Status: DC

## 2016-04-18 MED ORDER — PSEUDOEPH-BROMPHEN-DM 30-2-10 MG/5ML PO SYRP: 5.0000 mL | ORAL_SOLUTION | Freq: Four times a day (QID) | ORAL | 3 refills | Status: DC | PRN

## 2016-04-18 NOTE — Progress Notes (Signed)
HPI  Patient presents to the office for evaluation of cough, wheezing and shortness of breath.  Patient has active multiple myeloma which is currently being treated with .  It has been going on for 2 weeks.  Patient reports wet, barky cough.  They also endorse change in voice, chills, postnasal drip, shortness of breath, wheezing and clear rhinorrhea, sinus pressure, headaches.  .  They have tried singulair and nasonex.  They report that nothing has worked.  They denies other sick contacts.  She had her last chemo treatment last Tuesday.  She is currently on a break and is due to have another treatment on next Monday.  She reports that she is tolerating them really well.     Review of Systems  Constitutional: Negative for chills, fever and malaise/fatigue.  HENT: Negative for congestion, ear pain and sore throat.   Eyes: Negative.   Respiratory: Negative for cough, shortness of breath and wheezing.   Cardiovascular: Negative for chest pain, palpitations and leg swelling.  Gastrointestinal: Negative for abdominal pain, blood in stool, constipation, diarrhea, heartburn and melena.  Genitourinary: Negative.   Skin: Negative.   Neurological: Negative for dizziness, sensory change, loss of consciousness and headaches.  Psychiatric/Behavioral: Negative for depression. The patient is not nervous/anxious and does not have insomnia.     PE:  Vitals:   04/18/16 1108  BP: 124/66  Pulse: 90  Resp: 16  Temp: 98 F (36.7 C)    General:  Alert and non-toxic, WDWN, NAD HEENT: NCAT, PERLA, EOM normal, no occular discharge or erythema.  Nasal mucosal edema with sinus tenderness to palpation.  Oropharynx clear with minimal oropharyngeal edema and erythema.  Mucous membranes moist and pink. Neck:  Cervical adenopathy Chest:  RRR no MRGs.  Lungs clear to auscultation A&P with no wheezes rhonchi or rales.   Abdomen: +BS x 4 quadrants, soft, non-tender, no guarding, rigidity, or rebound. Skin: warm and dry  no rash Neuro: A&Ox4, CN II-XII grossly intact  Assessment and Plan:   1. Acute URI -duoneb -albuterol at home -prednisone -levaquin -bromphenermine dm syrup  2. Hypothyroidism, unspecified type -recheck thyroid level today instead of lab only tomorrow.   - TSH

## 2016-04-18 NOTE — Patient Instructions (Addendum)
Please take 1 tablet daily of the levaquin which is the antibiotic until it is gone.  Please stop taking the decadron.  Do not restart it until you finish the prednisone  Please start take the prednisone as prescribed.    Please continue to use the nasonex twice daily.    Please take claritin, zyrtec or allegra.    Please take the cough syrup up to 4 times per day.

## 2016-04-19 ENCOUNTER — Encounter: Payer: Self-pay | Admitting: Pharmacist

## 2016-04-19 ENCOUNTER — Ambulatory Visit: Payer: Self-pay

## 2016-04-19 NOTE — Telephone Encounter (Signed)
Notified pt rx ready for pick up 

## 2016-04-22 ENCOUNTER — Telehealth: Payer: Self-pay | Admitting: Internal Medicine

## 2016-04-22 ENCOUNTER — Ambulatory Visit: Payer: Medicare Other

## 2016-04-22 ENCOUNTER — Other Ambulatory Visit (HOSPITAL_BASED_OUTPATIENT_CLINIC_OR_DEPARTMENT_OTHER): Payer: Medicare Other

## 2016-04-22 ENCOUNTER — Encounter: Payer: Self-pay | Admitting: Internal Medicine

## 2016-04-22 ENCOUNTER — Ambulatory Visit (HOSPITAL_BASED_OUTPATIENT_CLINIC_OR_DEPARTMENT_OTHER): Payer: Medicare Other | Admitting: Internal Medicine

## 2016-04-22 VITALS — BP 144/68 | HR 91 | Temp 99.0°F | Resp 18 | Ht 64.75 in | Wt 214.5 lb

## 2016-04-22 DIAGNOSIS — G620 Drug-induced polyneuropathy: Secondary | ICD-10-CM | POA: Diagnosis not present

## 2016-04-22 DIAGNOSIS — Z5111 Encounter for antineoplastic chemotherapy: Secondary | ICD-10-CM

## 2016-04-22 DIAGNOSIS — C9002 Multiple myeloma in relapse: Secondary | ICD-10-CM

## 2016-04-22 DIAGNOSIS — M549 Dorsalgia, unspecified: Secondary | ICD-10-CM

## 2016-04-22 LAB — CBC WITH DIFFERENTIAL/PLATELET
BASO%: 0.3 % (ref 0.0–2.0)
Basophils Absolute: 0 10*3/uL (ref 0.0–0.1)
EOS%: 0.2 % (ref 0.0–7.0)
Eosinophils Absolute: 0 10*3/uL (ref 0.0–0.5)
HCT: 29.9 % — ABNORMAL LOW (ref 34.8–46.6)
HGB: 9.7 g/dL — ABNORMAL LOW (ref 11.6–15.9)
LYMPH%: 20.9 % (ref 14.0–49.7)
MCH: 32.9 pg (ref 25.1–34.0)
MCHC: 32.4 g/dL (ref 31.5–36.0)
MCV: 101.4 fL — ABNORMAL HIGH (ref 79.5–101.0)
MONO#: 1.1 10*3/uL — ABNORMAL HIGH (ref 0.1–0.9)
MONO%: 18.3 % — AB (ref 0.0–14.0)
NEUT%: 60.3 % (ref 38.4–76.8)
NEUTROS ABS: 3.5 10*3/uL (ref 1.5–6.5)
Platelets: 149 10*3/uL (ref 145–400)
RBC: 2.95 10*6/uL — AB (ref 3.70–5.45)
RDW: 17.4 % — ABNORMAL HIGH (ref 11.2–14.5)
WBC: 5.8 10*3/uL (ref 3.9–10.3)
lymph#: 1.2 10*3/uL (ref 0.9–3.3)

## 2016-04-22 LAB — COMPREHENSIVE METABOLIC PANEL
ALK PHOS: 92 U/L (ref 40–150)
ALT: 25 U/L (ref 0–55)
AST: 17 U/L (ref 5–34)
Albumin: 3.1 g/dL — ABNORMAL LOW (ref 3.5–5.0)
Anion Gap: 9 mEq/L (ref 3–11)
BUN: 18.4 mg/dL (ref 7.0–26.0)
CALCIUM: 9.2 mg/dL (ref 8.4–10.4)
CHLORIDE: 107 meq/L (ref 98–109)
CO2: 25 mEq/L (ref 22–29)
Creatinine: 1.1 mg/dL (ref 0.6–1.1)
EGFR: 62 mL/min/{1.73_m2} — AB (ref >90)
Glucose: 71 mg/dl (ref 70–140)
POTASSIUM: 4 meq/L (ref 3.5–5.1)
Sodium: 141 mEq/L (ref 136–145)
Total Bilirubin: 0.58 mg/dL (ref 0.20–1.20)
Total Protein: 7.3 g/dL (ref 6.4–8.3)

## 2016-04-22 NOTE — Telephone Encounter (Signed)
GAVE PATIENT DTR AVS REPORT AND APPOINTMENTS FOR October THRU January

## 2016-04-22 NOTE — Progress Notes (Signed)
[facility] [phone]   Fax:([phone]) [place], [place] [address]  DIAGNOSIS: Recurrent multiple myeloma initially diagnosed in October 2008.   PRIOR THERAPY:  1. Status post palliative radiotherapy to the lumbar spine between L3 and L5. The patient received a total dose of 3000 cGy in 10 fractions under the care of Dr. Lisbeth Renshaw between May 05, 2007 through May 18, 2007. 2. Status post 5 cycles of systemic chemotherapy with Revlimid and low-dose Decadron with good response to this treatment. 3. Status post autologous peripheral blood stem cell transplant at [facility] on Nov 20, 2007 under the care of Dr. Valarie Merino. 4. Status post treatment for disease recurrence with Velcade, Doxil and Decadron. Last dose given Nov 09, 2009. Discontinued secondary to intolerance but the patient had a good response to treatment at that time. 5. Status post palliative radiotherapy to the T2-T6 thoracic vertebrae completed 03/21/2011 under the care of Dr. Lisbeth Renshaw. 6. Systemic chemotherapy with Velcade 1.3 mg per meter squared given on days 1, 4, 8 and 11, and Doxil at 30 mg per meter squared given on day 4 in addition to Decadron status post 4 cycles, discontinued secondary to intolerance. 7. Systemic therapy with Velcade 1.3 mg/M2 subcutaneously in addition to Decadron 40 mg by mouth on a weekly basis, status post 20 cycles. The patient had good response with this treatment but it is discontinue today secondary to worsening peripheral neuropathy. 8. Palliative radiotherapy to the skull lesion as well as the left hip area under the care of Dr. Lisbeth Renshaw. 9. Systemic chemotherapy with Carfilzomib 20 mg/M2 on days 1, 2,  8, 9, 13 and 16 every 4 weeks in addition to weekly Decadron 40 mg by mouth. First dose on 04/19/2013. Status post 4 cycles, discontinued recently secondary to cardiac  dysfunction. 10. Pomalyst 3 mg by mouth daily for 21 days every 4 weeks in addition to dexamethasone 40 mg on a weekly basis. First dose started 12/02/2013. Status post 28 cycles. Her dose of Pomalyst will be reduced to 2 mg by mouth daily for 21 days every 4 weeks starting from cycle 23, discontinued secondary to disease progression.    CURRENT THERAPY:  1. Systemic chemotherapy with Carfilzomib 27 MG/M2, Cytoxan 300 MG/M2 on days 1, 2, 8, 9, 15 and 16 every 4 weeks in addition to weekly Decadron 20 mg by mouth. First cycle 03/25/2016. Status post one cycle. 2. Zometa 4 mg IV given every 3 months.   INTERVAL HISTORY: [name] 65 y.o. female returns to the clinic today for follow up visit accompanied by her 2 daughters. The patient was started recently on treatment with Carfilzomib, Cytoxan and Decadron status post 1 cycle and tolerated the first cycle of her treatment fairly well. She was seen recently by her primary care physician for questionable bronchitis and wheezing. She was started on Levaquin, Medrol Dosepak and inhalers 3 days ago. She is feeling a little bit better. She denied having any significant chest pain, but continues to have mild shortness breath with cough and no hemoptysis. She has no current nausea, vomiting, diarrhea or constipation. She has persistent low back pain and she is currently on pain medication with MS Contin and Norco. She denied having any significant weight loss or night sweats. She was supposed to start cycle #2 today.  MEDICAL HISTORY: Past Medical History:  Diagnosis Date  . Compression fracture 04/08/2007  pathologic compression fracture  . Constipation    takes oxycontin,vicodin  . Family history of anesthesia complication    "daughter gets PONV too"  . FHx: chemotherapy    s/p 5 cycle revlimid/low dose decadron,s/p velcade,doxil,decadron,  . GERD (gastroesophageal reflux disease)   . History of autologous stem cell transplant ([place])  11/20/2007   [facility], Dr Valarie Merino  . Hx of radiation therapy 05/05/07-05/18/07,& 03/05/11-03/21/11-   l3&l5 in 2008, t2-t6 03/2011  . Hx of radiation therapy 05/05/2007 to 05/18/2007   palliative, L3-5  . Hx of radiation therapy 03/05/2011 to 03/21/2011   palliative T2-T6, c-spine  . Hypercholesterolemia   . Hypothyroidism   . Insomnia    associated with steroids  . Metastasis to bone ([place])   . Multiple myeloma ([place]) dx'd 2009  . Pneumonia    "several times"  . PONV (postoperative nausea and vomiting)   . Stroke [facility]) 2014   denies residual on 01/27/2014    ALLERGIES:  has No Known Allergies.  MEDICATIONS:  Current Outpatient Prescriptions  Medication Sig Dispense Refill  . albuterol (PROVENTIL HFA;VENTOLIN HFA) 108 (90 Base) MCG/ACT inhaler Inhale 2 puffs into the lungs every 2 (two) hours as needed for wheezing or shortness of breath (cough). 1 Inhaler 0  . ALPRAZolam (XANAX) 0.5 MG tablet TAKE 1/2 TO 1 TABLET 2 TO 3 TIMES DAILY AS NEEDED FOR ANXIETY 270 tablet 1  . aspirin 81 MG tablet Take 81 mg by mouth as directed. Mondays, Wednesdays, and Fridays    . blood glucose meter kit and supplies Test blood sugars once a day 1 each 0  . brompheniramine-pseudoephedrine-DM 30-2-10 MG/5ML syrup Take 5 mLs by mouth 4 (four) times daily as needed. 473 mL 3  . citalopram (CELEXA) 40 MG tablet TAKE 1 TABLET (40 MG TOTAL) BY MOUTH DAILY. 90 tablet 2  . cyclobenzaprine (FLEXERIL) 10 MG tablet TAKE 1 TABLET 3 TIMES A DAY AS NEEDED FOR MUSCLE SPASMS 90 tablet 3  . dexamethasone (DECADRON) 4 MG tablet TAKE (10) TABLETS BY MOUTH EVERY FRIDAY 40 tablet 1  . diphenoxylate-atropine (LOMOTIL) 2.5-0.025 MG tablet TAKE 2 TABLETS BY MOUTH 4 TIMES A DAY AS NEEDED FOR DIARRHEA 30 tablet 1  . Eszopiclone 3 MG TABS TAKE 1 TABLET AT BEDTIME AS NEEDED SLEEP 30 tablet 2  . furosemide (LASIX) 40 MG tablet TAKE 1 TABLET BY MOUTH TWICE A DAY FOR FLUID AND SWELLING 90 tablet 1  . gabapentin (NEURONTIN) 600 MG tablet TAKE 1  TABLET 4 X DAILY AS NEEDED FOR PAIN OR CRAMPS 120 tablet 4  . HYDROcodone-acetaminophen (NORCO) 7.5-325 MG tablet Take 1 tablet by mouth every 6 (six) hours as needed for moderate pain. 30 tablet 0  . levofloxacin (LEVAQUIN) 750 MG tablet Take 1 tablet (750 mg total) by mouth daily. X 7 days 7 tablet 0  . loperamide (IMODIUM) 2 MG capsule Take 2 mg by mouth as needed for diarrhea or loose stools. Reported on 10/02/2015    . Magnesium 500 MG TABS Take 500 mg by mouth 2 (two) times daily.     . mometasone (NASONEX) 50 MCG/ACT nasal spray PLACE 2 SPRAYS INTO THE NOSE DAILY. (Patient taking differently: PLACE 2 SPRAYS INTO THE NOSE DAILY AS NEEDED FOR ALLERGIES.) 51 g 1  . montelukast (SINGULAIR) 10 MG tablet TAKE 1 TABLET BY MOUTH EVERY DAY 90 tablet 1  . morphine (MS CONTIN) 15 MG 12 hr tablet Take 1 tablet (15 mg total) by mouth every 12 (twelve) hours. 60 tablet 0  .  Multiple Vitamin (MULITIVITAMIN WITH MINERALS) TABS Take 1 tablet by mouth every morning.     . ondansetron (ZOFRAN-ODT) 8 MG disintegrating tablet TAKE 1 TABLET BY MOUTH EVERY 8 HOURS AS NEEDED FOR NAUSEA AND VOMITING 30 tablet 1  . pantoprazole (PROTONIX) 40 MG tablet TAKE 1 TABLET (40 MG TOTAL) BY MOUTH 2 (TWO) TIMES DAILY. (Patient taking differently: Take 40 mg by mouth daily. ) 90 tablet 1  . potassium chloride (KLOR-CON 10) 10 MEQ tablet Take 2 tablets by mouth 2 times daily. 360 tablet 1  . predniSONE (DELTASONE) 20 MG tablet 3 tabs po daily x 3 days, then 2 tabs x 3 days, then 1.5 tabs x 3 days, then 1 tab x 3 days, then 0.5 tabs x 3 days 27 tablet 0  . prochlorperazine (COMPAZINE) 10 MG tablet Take 1 tablet (10 mg total) by mouth every 6 (six) hours as needed for nausea or vomiting. 30 tablet 0  . SYNTHROID 100 MCG tablet TAKE 1 TABLET BY MOUTH DAILY 90 tablet 1  . traZODone (DESYREL) 150 MG tablet TAKE 1/2 TO 1 TABLET 1 HOUR BEFORE SLEEP. 90 tablet 3   Current Facility-Administered Medications  Medication Dose Route Frequency  Provider Last Rate Last Dose  . ipratropium-albuterol (DUONEB) 0.5-2.5 (3) MG/3ML nebulizer solution 3 mL  3 mL Nebulization Once Starlyn Skeans, [name]        SURGICAL HISTORY:  Past Surgical History:  Procedure Laterality Date  . CHOLECYSTECTOMY  1980's  . KNEE ARTHROSCOPY Right    "put pin in"  . KNEE ARTHROSCOPY Right    "took pin out and corrected what was wrong"  . PORTACATH PLACEMENT Right 2009  . VAGINAL HYSTERECTOMY  1980's  . VIDEO BRONCHOSCOPY  07/30/2011   Procedure: VIDEO BRONCHOSCOPY WITHOUT FLUORO;  Surgeon: Kathee Delton, MD;  Location: [facility];  Service: Cardiopulmonary;  Laterality: Bilateral;    REVIEW OF SYSTEMS:  Constitutional: positive for fatigue Eyes: negative Ears, nose, mouth, throat, and face: negative Respiratory: positive for cough and dyspnea on exertion Cardiovascular: negative Gastrointestinal: negative Genitourinary:negative Integument/breast: negative Hematologic/lymphatic: negative Musculoskeletal:positive for back pain Neurological: negative Behavioral/Psych: negative Endocrine: negative Allergic/Immunologic: negative   PHYSICAL EXAMINATION: General appearance: alert, cooperative, fatigued and no distress Head: Normocephalic, without obvious abnormality, atraumatic Neck: no adenopathy, no JVD, supple, symmetrical, trachea midline and thyroid not enlarged, symmetric, no tenderness/mass/nodules Lymph nodes: Cervical, supraclavicular, and axillary nodes normal. Resp: clear to auscultation bilaterally Back: symmetric, no curvature. ROM normal. No CVA tenderness. Cardio: regular rate and rhythm, S1, S2 normal, no murmur, click, rub or gallop GI: soft, non-tender; bowel sounds normal; no masses,  no organomegaly Extremities: edema 1+ edema of left lower extremity. Neurologic: Alert and oriented X 3, normal strength and tone. Normal symmetric reflexes. Normal coordination and gait  ECOG PERFORMANCE STATUS: 1 - Symptomatic but completely  ambulatory  Blood pressure (!) 144/68, pulse 91, temperature 99 F (37.2 C), temperature source Oral, resp. rate 18, height 5' 4.75" (1.645 m), weight 214 lb 8 oz (97.3 kg), SpO2 98 %.  LABORATORY DATA: Lab Results  Component Value Date   WBC 5.8 04/22/2016   HGB 9.7 (L) 04/22/2016   HCT 29.9 (L) 04/22/2016   MCV 101.4 (H) 04/22/2016   PLT 149 04/22/2016      Chemistry      Component Value Date/Time   NA 141 04/22/2016 1006   K 4.0 04/22/2016 1006   CL 107 03/20/2016 1001   CL 105 12/17/2012 1015   CO2 25 04/22/2016  1006   BUN 18.4 04/22/2016 1006   CREATININE 1.1 04/22/2016 1006      Component Value Date/Time   CALCIUM 9.2 04/22/2016 1006   ALKPHOS 92 04/22/2016 1006   AST 17 04/22/2016 1006   ALT 25 04/22/2016 1006   BILITOT 0.58 04/22/2016 1006     Myeloma panel Performed 03/11/2016 showed beta 2 microglobulin 2.3, IgG 2105, IgA 93 and IgM 10. Free kappa light chain 19.5, free lambda light chain 170.2 and a kappa/lambda ratio 0.11  RADIOGRAPHIC STUDIES: No results found. ASSESSMENT AND PLAN:  This is a very pleasant 65 years old [place] American female with recurrent multiple myeloma recently completed a course of treatment with Velcade and Decadron with improvement in her disease but this was discontinued secondary to peripheral neuropathy. The patient tolerated her treatment with Carfilzomib and Decadron fairly well except for the recent shortness breath and cough which was felt to be secondary to congestive heart failure from her treatment with Carfilzomib. This treatment was discontinued. She was started on treatment with Pomalyst and Decadron status post 28 cycles. She tolerated the last cycle of her treatment well. I recommended for the patient to continue her treatment with Pomalyst at reduced dose of 2 mg.  Unfortunately the recent myeloma panel showed significant elevation of the free lambda light chain. She is currently undergoing treatment with Carfilzomib,  Cytoxan and Decadron status post one cycle. She tolerated the first cycle of her treatment fairly well with no significant adverse effects. She is currently undergoing treatment for acute bronchitis. I recommended for the patient to delay the start of cycle #2 by 1 week until improvement of her condition. She will come back for follow-up visit in 5 weeks with the start of cycle #3 after repeating myeloma panel for reevaluation of her disease. For the back pain she will continue on MS Contin and Norco. She was advised to call immediately if she has any concerning symptoms in the interval. The patient voices understanding of current disease status and treatment options and is in agreement with the current care plan.  All questions were answered. The patient knows to call the clinic with any problems, questions or concerns. We can certainly see the patient much sooner if necessary.  Disclaimer: This note was dictated with voice recognition software. Similar sounding words can inadvertently be transcribed and may be missed upon review.

## 2016-04-23 ENCOUNTER — Ambulatory Visit: Payer: Self-pay

## 2016-04-29 ENCOUNTER — Other Ambulatory Visit (HOSPITAL_BASED_OUTPATIENT_CLINIC_OR_DEPARTMENT_OTHER): Payer: Medicare Other

## 2016-04-29 ENCOUNTER — Ambulatory Visit (HOSPITAL_BASED_OUTPATIENT_CLINIC_OR_DEPARTMENT_OTHER): Payer: Medicare Other

## 2016-04-29 ENCOUNTER — Other Ambulatory Visit: Payer: Self-pay | Admitting: Internal Medicine

## 2016-04-29 VITALS — BP 141/71 | HR 71 | Temp 98.0°F | Resp 17

## 2016-04-29 DIAGNOSIS — C9002 Multiple myeloma in relapse: Secondary | ICD-10-CM | POA: Diagnosis not present

## 2016-04-29 DIAGNOSIS — Z5112 Encounter for antineoplastic immunotherapy: Secondary | ICD-10-CM | POA: Diagnosis not present

## 2016-04-29 DIAGNOSIS — Z5111 Encounter for antineoplastic chemotherapy: Secondary | ICD-10-CM | POA: Diagnosis not present

## 2016-04-29 LAB — CBC WITH DIFFERENTIAL/PLATELET
BASO%: 0.2 % (ref 0.0–2.0)
Basophils Absolute: 0 10*3/uL (ref 0.0–0.1)
EOS ABS: 0 10*3/uL (ref 0.0–0.5)
EOS%: 0.3 % (ref 0.0–7.0)
HEMATOCRIT: 30.2 % — AB (ref 34.8–46.6)
HGB: 9.9 g/dL — ABNORMAL LOW (ref 11.6–15.9)
LYMPH#: 1.2 10*3/uL (ref 0.9–3.3)
LYMPH%: 20.2 % (ref 14.0–49.7)
MCH: 33.8 pg (ref 25.1–34.0)
MCHC: 32.6 g/dL (ref 31.5–36.0)
MCV: 103.7 fL — AB (ref 79.5–101.0)
MONO#: 1 10*3/uL — AB (ref 0.1–0.9)
MONO%: 15.8 % — ABNORMAL HIGH (ref 0.0–14.0)
NEUT#: 3.8 10*3/uL (ref 1.5–6.5)
NEUT%: 63.5 % (ref 38.4–76.8)
PLATELETS: 140 10*3/uL — AB (ref 145–400)
RBC: 2.92 10*6/uL — AB (ref 3.70–5.45)
RDW: 19.3 % — ABNORMAL HIGH (ref 11.2–14.5)
WBC: 6.1 10*3/uL (ref 3.9–10.3)

## 2016-04-29 LAB — COMPREHENSIVE METABOLIC PANEL
ALT: 18 U/L (ref 0–55)
ANION GAP: 9 meq/L (ref 3–11)
AST: 9 U/L (ref 5–34)
Albumin: 3.1 g/dL — ABNORMAL LOW (ref 3.5–5.0)
Alkaline Phosphatase: 107 U/L (ref 40–150)
BILIRUBIN TOTAL: 0.65 mg/dL (ref 0.20–1.20)
BUN: 14.8 mg/dL (ref 7.0–26.0)
CALCIUM: 9 mg/dL (ref 8.4–10.4)
CHLORIDE: 109 meq/L (ref 98–109)
CO2: 25 meq/L (ref 22–29)
Creatinine: 0.9 mg/dL (ref 0.6–1.1)
EGFR: 77 mL/min/{1.73_m2} — AB (ref >90)
Glucose: 71 mg/dl (ref 70–140)
Potassium: 3.6 mEq/L (ref 3.5–5.1)
Sodium: 143 mEq/L (ref 136–145)
TOTAL PROTEIN: 6.8 g/dL (ref 6.4–8.3)

## 2016-04-29 MED ORDER — SODIUM CHLORIDE 0.9 % IV SOLN
Freq: Once | INTRAVENOUS | Status: AC
  Administered 2016-04-29: 10:00:00 via INTRAVENOUS

## 2016-04-29 MED ORDER — DEXAMETHASONE SODIUM PHOSPHATE 10 MG/ML IJ SOLN
INTRAMUSCULAR | Status: AC
  Filled 2016-04-29: qty 1

## 2016-04-29 MED ORDER — HEPARIN SOD (PORK) LOCK FLUSH 100 UNIT/ML IV SOLN
500.0000 [IU] | Freq: Once | INTRAVENOUS | Status: AC | PRN
  Administered 2016-04-29: 500 [IU]
  Filled 2016-04-29: qty 5

## 2016-04-29 MED ORDER — DEXAMETHASONE SODIUM PHOSPHATE 10 MG/ML IJ SOLN
10.0000 mg | Freq: Once | INTRAMUSCULAR | Status: AC
  Administered 2016-04-29: 10 mg via INTRAVENOUS

## 2016-04-29 MED ORDER — CYCLOPHOSPHAMIDE CHEMO INJECTION 1 GM
300.0000 mg/m2 | Freq: Once | INTRAMUSCULAR | Status: AC
  Administered 2016-04-29: 620 mg via INTRAVENOUS
  Filled 2016-04-29: qty 31

## 2016-04-29 MED ORDER — PALONOSETRON HCL INJECTION 0.25 MG/5ML
0.2500 mg | Freq: Once | INTRAVENOUS | Status: AC
Start: 2016-04-29 — End: 2016-04-29
  Administered 2016-04-29: 0.25 mg via INTRAVENOUS

## 2016-04-29 MED ORDER — DEXTROSE 5 % IV SOLN
27.0000 mg/m2 | Freq: Once | INTRAVENOUS | Status: AC
  Administered 2016-04-29: 56 mg via INTRAVENOUS
  Filled 2016-04-29: qty 28

## 2016-04-29 MED ORDER — SODIUM CHLORIDE 0.9% FLUSH
10.0000 mL | INTRAVENOUS | Status: DC | PRN
  Administered 2016-04-29: 10 mL
  Filled 2016-04-29: qty 10

## 2016-04-29 MED ORDER — PALONOSETRON HCL INJECTION 0.25 MG/5ML
INTRAVENOUS | Status: AC
  Filled 2016-04-29: qty 5

## 2016-04-29 MED ORDER — SODIUM CHLORIDE 0.9 % IV SOLN: Freq: Once | INTRAVENOUS | Status: DC

## 2016-04-29 NOTE — Patient Instructions (Signed)
[facility] Discharge Instructions for Patients Receiving Chemotherapy  Today you received the following chemotherapy agents:  Cytoxan, Kyprolis  To help prevent nausea and vomiting after your treatment, we encourage you to take your nausea medication as prescribed.   If you develop nausea and vomiting that is not controlled by your nausea medication, call the clinic.   BELOW ARE SYMPTOMS THAT SHOULD BE REPORTED IMMEDIATELY:  *FEVER GREATER THAN 100.5 F  *CHILLS WITH OR WITHOUT FEVER  NAUSEA AND VOMITING THAT IS NOT CONTROLLED WITH YOUR NAUSEA MEDICATION  *UNUSUAL SHORTNESS OF BREATH  *UNUSUAL BRUISING OR BLEEDING  TENDERNESS IN MOUTH AND THROAT WITH OR WITHOUT PRESENCE OF ULCERS  *URINARY PROBLEMS  *BOWEL PROBLEMS  UNUSUAL RASH Items with * indicate a potential emergency and should be followed up as soon as possible.  Feel free to call the clinic you have any questions or concerns. The clinic phone number is [phone].  Please show the [facility] at check-in to the Emergency Department and triage nurse.

## 2016-04-30 ENCOUNTER — Ambulatory Visit (HOSPITAL_BASED_OUTPATIENT_CLINIC_OR_DEPARTMENT_OTHER): Payer: Medicare Other

## 2016-04-30 VITALS — BP 155/85 | HR 73 | Temp 97.9°F | Resp 18

## 2016-04-30 DIAGNOSIS — C9002 Multiple myeloma in relapse: Secondary | ICD-10-CM | POA: Diagnosis not present

## 2016-04-30 DIAGNOSIS — Z5112 Encounter for antineoplastic immunotherapy: Secondary | ICD-10-CM | POA: Diagnosis not present

## 2016-04-30 MED ORDER — SODIUM CHLORIDE 0.9% FLUSH
10.0000 mL | INTRAVENOUS | Status: DC | PRN
  Administered 2016-04-30: 10 mL
  Filled 2016-04-30: qty 10

## 2016-04-30 MED ORDER — DEXAMETHASONE SODIUM PHOSPHATE 10 MG/ML IJ SOLN
INTRAMUSCULAR | Status: AC
  Filled 2016-04-30: qty 1

## 2016-04-30 MED ORDER — HEPARIN SOD (PORK) LOCK FLUSH 100 UNIT/ML IV SOLN
500.0000 [IU] | Freq: Once | INTRAVENOUS | Status: AC | PRN
  Administered 2016-04-30: 500 [IU]
  Filled 2016-04-30: qty 5

## 2016-04-30 MED ORDER — CARFILZOMIB CHEMO INJECTION 60 MG
27.0000 mg/m2 | Freq: Once | INTRAVENOUS | Status: AC
  Administered 2016-04-30: 56 mg via INTRAVENOUS
  Filled 2016-04-30: qty 28

## 2016-04-30 MED ORDER — SODIUM CHLORIDE 0.9 % IV SOLN
Freq: Once | INTRAVENOUS | Status: AC
  Administered 2016-04-30: 10:00:00 via INTRAVENOUS

## 2016-04-30 MED ORDER — DEXAMETHASONE SODIUM PHOSPHATE 10 MG/ML IJ SOLN
10.0000 mg | Freq: Once | INTRAMUSCULAR | Status: AC
  Administered 2016-04-30: 10 mg via INTRAVENOUS

## 2016-04-30 MED ORDER — SODIUM CHLORIDE 0.9 % IV SOLN
Freq: Once | INTRAVENOUS | Status: AC
  Administered 2016-04-30: 09:00:00 via INTRAVENOUS

## 2016-04-30 NOTE — Patient Instructions (Signed)
[facility] Discharge Instructions for Patients Receiving Chemotherapy  Today you received the following chemotherapy agents kyprolis  To help prevent nausea and vomiting after your treatment, we encourage you to take your nausea medication as directed   If you develop nausea and vomiting that is not controlled by your nausea medication, call the clinic.   BELOW ARE SYMPTOMS THAT SHOULD BE REPORTED IMMEDIATELY:  *FEVER GREATER THAN 100.5 F  *CHILLS WITH OR WITHOUT FEVER  NAUSEA AND VOMITING THAT IS NOT CONTROLLED WITH YOUR NAUSEA MEDICATION  *UNUSUAL SHORTNESS OF BREATH  *UNUSUAL BRUISING OR BLEEDING  TENDERNESS IN MOUTH AND THROAT WITH OR WITHOUT PRESENCE OF ULCERS  *URINARY PROBLEMS  *BOWEL PROBLEMS  UNUSUAL RASH Items with * indicate a potential emergency and should be followed up as soon as possible.  Feel free to call the clinic you have any questions or concerns. The clinic phone number is [phone].  

## 2016-05-06 ENCOUNTER — Ambulatory Visit (HOSPITAL_BASED_OUTPATIENT_CLINIC_OR_DEPARTMENT_OTHER): Payer: Medicare Other

## 2016-05-06 ENCOUNTER — Other Ambulatory Visit (HOSPITAL_BASED_OUTPATIENT_CLINIC_OR_DEPARTMENT_OTHER): Payer: Medicare Other

## 2016-05-06 VITALS — BP 136/66 | HR 97 | Temp 97.6°F | Resp 18

## 2016-05-06 DIAGNOSIS — Z5111 Encounter for antineoplastic chemotherapy: Secondary | ICD-10-CM | POA: Diagnosis not present

## 2016-05-06 DIAGNOSIS — C9002 Multiple myeloma in relapse: Secondary | ICD-10-CM

## 2016-05-06 DIAGNOSIS — Z5112 Encounter for antineoplastic immunotherapy: Secondary | ICD-10-CM

## 2016-05-06 LAB — CBC WITH DIFFERENTIAL/PLATELET
BASO%: 0.1 % (ref 0.0–2.0)
Basophils Absolute: 0 10e3/uL (ref 0.0–0.1)
EOS%: 0 % (ref 0.0–7.0)
Eosinophils Absolute: 0 10e3/uL (ref 0.0–0.5)
HCT: 33.6 % — ABNORMAL LOW (ref 34.8–46.6)
HGB: 11 g/dL — ABNORMAL LOW (ref 11.6–15.9)
LYMPH%: 13.1 % — ABNORMAL LOW (ref 14.0–49.7)
MCH: 33.9 pg (ref 25.1–34.0)
MCHC: 32.7 g/dL (ref 31.5–36.0)
MCV: 103.7 fL — ABNORMAL HIGH (ref 79.5–101.0)
MONO#: 0.2 10e3/uL (ref 0.1–0.9)
MONO%: 4 % (ref 0.0–14.0)
NEUT#: 4.6 10e3/uL (ref 1.5–6.5)
NEUT%: 82.8 % — ABNORMAL HIGH (ref 38.4–76.8)
Platelets: 102 10e3/uL — ABNORMAL LOW (ref 145–400)
RBC: 3.24 10e6/uL — ABNORMAL LOW (ref 3.70–5.45)
RDW: 19.6 % — ABNORMAL HIGH (ref 11.2–14.5)
WBC: 5.6 10e3/uL (ref 3.9–10.3)
lymph#: 0.7 10e3/uL — ABNORMAL LOW (ref 0.9–3.3)

## 2016-05-06 LAB — COMPREHENSIVE METABOLIC PANEL
ALBUMIN: 3.1 g/dL — AB (ref 3.5–5.0)
ALT: 28 U/L (ref 0–55)
AST: 13 U/L (ref 5–34)
Alkaline Phosphatase: 80 U/L (ref 40–150)
Anion Gap: 9 mEq/L (ref 3–11)
BILIRUBIN TOTAL: 1.13 mg/dL (ref 0.20–1.20)
BUN: 14.7 mg/dL (ref 7.0–26.0)
CO2: 24 meq/L (ref 22–29)
Calcium: 9 mg/dL (ref 8.4–10.4)
Chloride: 107 mEq/L (ref 98–109)
Creatinine: 0.9 mg/dL (ref 0.6–1.1)
EGFR: 74 mL/min/{1.73_m2} — AB (ref >90)
GLUCOSE: 156 mg/dL — AB (ref 70–140)
Potassium: 4.7 mEq/L (ref 3.5–5.1)
SODIUM: 139 meq/L (ref 136–145)
TOTAL PROTEIN: 7.2 g/dL (ref 6.4–8.3)

## 2016-05-06 MED ORDER — SODIUM CHLORIDE 0.9 % IV SOLN
Freq: Once | INTRAVENOUS | Status: AC
  Administered 2016-05-06: 10:00:00 via INTRAVENOUS

## 2016-05-06 MED ORDER — SODIUM CHLORIDE 0.9 % IV SOLN
300.0000 mg/m2 | Freq: Once | INTRAVENOUS | Status: AC
  Administered 2016-05-06: 620 mg via INTRAVENOUS
  Filled 2016-05-06: qty 31

## 2016-05-06 MED ORDER — DEXTROSE 5 % IV SOLN
27.0000 mg/m2 | Freq: Once | INTRAVENOUS | Status: AC
  Administered 2016-05-06: 56 mg via INTRAVENOUS
  Filled 2016-05-06: qty 28

## 2016-05-06 MED ORDER — SODIUM CHLORIDE 0.9% FLUSH
10.0000 mL | INTRAVENOUS | Status: DC | PRN
Start: 2016-05-06 — End: 2016-05-06
  Administered 2016-05-06: 10 mL
  Filled 2016-05-06: qty 10

## 2016-05-06 MED ORDER — DEXAMETHASONE SODIUM PHOSPHATE 10 MG/ML IJ SOLN
10.0000 mg | Freq: Once | INTRAMUSCULAR | Status: AC
  Administered 2016-05-06: 10 mg via INTRAVENOUS

## 2016-05-06 MED ORDER — PALONOSETRON HCL INJECTION 0.25 MG/5ML
0.2500 mg | Freq: Once | INTRAVENOUS | Status: AC
  Administered 2016-05-06: 0.25 mg via INTRAVENOUS

## 2016-05-06 MED ORDER — HEPARIN SOD (PORK) LOCK FLUSH 100 UNIT/ML IV SOLN
500.0000 [IU] | Freq: Once | INTRAVENOUS | Status: AC | PRN
  Administered 2016-05-06: 500 [IU]
  Filled 2016-05-06: qty 5

## 2016-05-06 MED ORDER — PALONOSETRON HCL INJECTION 0.25 MG/5ML
INTRAVENOUS | Status: AC
  Filled 2016-05-06: qty 5

## 2016-05-06 MED ORDER — DEXAMETHASONE SODIUM PHOSPHATE 10 MG/ML IJ SOLN
INTRAMUSCULAR | Status: AC
  Filled 2016-05-06: qty 1

## 2016-05-06 NOTE — Patient Instructions (Signed)
[facility] Discharge Instructions for Patients Receiving Chemotherapy  Today you received the following chemotherapy agents Cytoxan and Kyprolis  To help prevent nausea and vomiting after your treatment, we encourage you to take your nausea medication as directed   If you develop nausea and vomiting that is not controlled by your nausea medication, call the clinic.   BELOW ARE SYMPTOMS THAT SHOULD BE REPORTED IMMEDIATELY:  *FEVER GREATER THAN 100.5 F  *CHILLS WITH OR WITHOUT FEVER  NAUSEA AND VOMITING THAT IS NOT CONTROLLED WITH YOUR NAUSEA MEDICATION  *UNUSUAL SHORTNESS OF BREATH  *UNUSUAL BRUISING OR BLEEDING  TENDERNESS IN MOUTH AND THROAT WITH OR WITHOUT PRESENCE OF ULCERS  *URINARY PROBLEMS  *BOWEL PROBLEMS  UNUSUAL RASH Items with * indicate a potential emergency and should be followed up as soon as possible.  Feel free to call the clinic you have any questions or concerns. The clinic phone number is [phone].  Please show the CHEMO ALERT CARD at check-in to the Emergency Department and triage nurse.   

## 2016-05-06 NOTE — Progress Notes (Signed)
Pt reports that she did not take her 0.5 tablet ('10mg'$ ) of prednisone or her 40 Mg of decadron at home today. Dr. Julien Nordmann aware and orders for pt to take the decadron and prednisone when pt gets home today and okay to proceed with treatment. Pt aware and verbalizes understanding.

## 2016-05-06 NOTE — Progress Notes (Signed)
Pt reports intermittent blurred vision, primarily when wearing glasses that started approximately a week and a half ago. Pt states she saw her eye doctor a month ago. Dr. Julien Nordmann aware, okay to continue with treatment and pt to schedule a follow up with her eye doctor. Pt aware and verbalizes understanding.

## 2016-05-07 ENCOUNTER — Ambulatory Visit (HOSPITAL_BASED_OUTPATIENT_CLINIC_OR_DEPARTMENT_OTHER): Payer: Medicare Other

## 2016-05-07 VITALS — BP 141/80 | HR 96 | Temp 98.0°F | Resp 18

## 2016-05-07 DIAGNOSIS — C9002 Multiple myeloma in relapse: Secondary | ICD-10-CM | POA: Diagnosis not present

## 2016-05-07 DIAGNOSIS — Z5112 Encounter for antineoplastic immunotherapy: Secondary | ICD-10-CM

## 2016-05-07 MED ORDER — SODIUM CHLORIDE 0.9% FLUSH
10.0000 mL | INTRAVENOUS | Status: DC | PRN
  Administered 2016-05-07: 10 mL
  Filled 2016-05-07: qty 10

## 2016-05-07 MED ORDER — DEXAMETHASONE SODIUM PHOSPHATE 10 MG/ML IJ SOLN
INTRAMUSCULAR | Status: AC
  Filled 2016-05-07: qty 1

## 2016-05-07 MED ORDER — HEPARIN SOD (PORK) LOCK FLUSH 100 UNIT/ML IV SOLN
500.0000 [IU] | Freq: Once | INTRAVENOUS | Status: AC | PRN
  Administered 2016-05-07: 500 [IU]
  Filled 2016-05-07: qty 5

## 2016-05-07 MED ORDER — SODIUM CHLORIDE 0.9 % IV SOLN
Freq: Once | INTRAVENOUS | Status: AC
  Administered 2016-05-07: 09:00:00 via INTRAVENOUS

## 2016-05-07 MED ORDER — DEXTROSE 5 % IV SOLN
27.0000 mg/m2 | Freq: Once | INTRAVENOUS | Status: AC
  Administered 2016-05-07: 56 mg via INTRAVENOUS
  Filled 2016-05-07: qty 28

## 2016-05-07 MED ORDER — DEXAMETHASONE SODIUM PHOSPHATE 10 MG/ML IJ SOLN
10.0000 mg | Freq: Once | INTRAMUSCULAR | Status: AC
  Administered 2016-05-07: 10 mg via INTRAVENOUS

## 2016-05-07 NOTE — Patient Instructions (Signed)
[facility] Discharge Instructions for Patients Receiving Chemotherapy  Today you received the following chemotherapy agents kyprolis  To help prevent nausea and vomiting after your treatment, we encourage you to take your nausea medication as directed   If you develop nausea and vomiting that is not controlled by your nausea medication, call the clinic.   BELOW ARE SYMPTOMS THAT SHOULD BE REPORTED IMMEDIATELY:  *FEVER GREATER THAN 100.5 F  *CHILLS WITH OR WITHOUT FEVER  NAUSEA AND VOMITING THAT IS NOT CONTROLLED WITH YOUR NAUSEA MEDICATION  *UNUSUAL SHORTNESS OF BREATH  *UNUSUAL BRUISING OR BLEEDING  TENDERNESS IN MOUTH AND THROAT WITH OR WITHOUT PRESENCE OF ULCERS  *URINARY PROBLEMS  *BOWEL PROBLEMS  UNUSUAL RASH Items with * indicate a potential emergency and should be followed up as soon as possible.  Feel free to call the clinic you have any questions or concerns. The clinic phone number is [phone].  

## 2016-05-07 NOTE — Progress Notes (Signed)
Clarified with Abelina Bachelor, RN working with Dr. Julien Nordmann, Patient to take ordered Decadron on Monday's from now on instead of on Fridays.

## 2016-05-08 DIAGNOSIS — C9000 Multiple myeloma not having achieved remission: Secondary | ICD-10-CM | POA: Diagnosis not present

## 2016-05-08 DIAGNOSIS — H40012 Open angle with borderline findings, low risk, left eye: Secondary | ICD-10-CM | POA: Diagnosis not present

## 2016-05-10 ENCOUNTER — Other Ambulatory Visit: Payer: Self-pay | Admitting: Physician Assistant

## 2016-05-13 ENCOUNTER — Telehealth: Payer: Self-pay | Admitting: *Deleted

## 2016-05-13 ENCOUNTER — Encounter: Payer: Medicare Other | Admitting: Nutrition

## 2016-05-13 ENCOUNTER — Other Ambulatory Visit (HOSPITAL_BASED_OUTPATIENT_CLINIC_OR_DEPARTMENT_OTHER): Payer: Medicare Other

## 2016-05-13 ENCOUNTER — Ambulatory Visit (HOSPITAL_BASED_OUTPATIENT_CLINIC_OR_DEPARTMENT_OTHER): Payer: Medicare Other

## 2016-05-13 ENCOUNTER — Other Ambulatory Visit: Payer: Self-pay | Admitting: Medical Oncology

## 2016-05-13 ENCOUNTER — Ambulatory Visit (HOSPITAL_BASED_OUTPATIENT_CLINIC_OR_DEPARTMENT_OTHER): Payer: Medicare Other | Admitting: Nurse Practitioner

## 2016-05-13 VITALS — BP 140/69 | HR 96 | Temp 98.2°F | Resp 18

## 2016-05-13 DIAGNOSIS — G893 Neoplasm related pain (acute) (chronic): Secondary | ICD-10-CM | POA: Diagnosis not present

## 2016-05-13 DIAGNOSIS — Z5111 Encounter for antineoplastic chemotherapy: Secondary | ICD-10-CM

## 2016-05-13 DIAGNOSIS — C9002 Multiple myeloma in relapse: Secondary | ICD-10-CM

## 2016-05-13 DIAGNOSIS — C9000 Multiple myeloma not having achieved remission: Secondary | ICD-10-CM

## 2016-05-13 DIAGNOSIS — Z5112 Encounter for antineoplastic immunotherapy: Secondary | ICD-10-CM | POA: Diagnosis not present

## 2016-05-13 LAB — CBC WITH DIFFERENTIAL/PLATELET
BASO%: 0 % (ref 0.0–2.0)
Basophils Absolute: 0 10*3/uL (ref 0.0–0.1)
EOS%: 0.8 % (ref 0.0–7.0)
Eosinophils Absolute: 0 10*3/uL (ref 0.0–0.5)
HEMATOCRIT: 31 % — AB (ref 34.8–46.6)
HEMOGLOBIN: 9.9 g/dL — AB (ref 11.6–15.9)
LYMPH#: 0.7 10*3/uL — AB (ref 0.9–3.3)
LYMPH%: 27.8 % (ref 14.0–49.7)
MCH: 33.6 pg (ref 25.1–34.0)
MCHC: 31.9 g/dL (ref 31.5–36.0)
MCV: 105.1 fL — ABNORMAL HIGH (ref 79.5–101.0)
MONO#: 0.3 10*3/uL (ref 0.1–0.9)
MONO%: 10 % (ref 0.0–14.0)
NEUT%: 61.4 % (ref 38.4–76.8)
NEUTROS ABS: 1.6 10*3/uL (ref 1.5–6.5)
PLATELETS: 71 10*3/uL — AB (ref 145–400)
RBC: 2.95 10*6/uL — ABNORMAL LOW (ref 3.70–5.45)
RDW: 18.5 % — AB (ref 11.2–14.5)
WBC: 2.6 10*3/uL — AB (ref 3.9–10.3)

## 2016-05-13 LAB — COMPREHENSIVE METABOLIC PANEL
ALT: 23 U/L (ref 0–55)
ANION GAP: 9 meq/L (ref 3–11)
AST: 17 U/L (ref 5–34)
Albumin: 3 g/dL — ABNORMAL LOW (ref 3.5–5.0)
Alkaline Phosphatase: 108 U/L (ref 40–150)
BILIRUBIN TOTAL: 1.04 mg/dL (ref 0.20–1.20)
BUN: 16.4 mg/dL (ref 7.0–26.0)
CALCIUM: 10.1 mg/dL (ref 8.4–10.4)
CHLORIDE: 106 meq/L (ref 98–109)
CO2: 23 mEq/L (ref 22–29)
Creatinine: 1.2 mg/dL — ABNORMAL HIGH (ref 0.6–1.1)
EGFR: 53 mL/min/{1.73_m2} — ABNORMAL LOW (ref >90)
Glucose: 137 mg/dl (ref 70–140)
Potassium: 4.1 mEq/L (ref 3.5–5.1)
Sodium: 139 mEq/L (ref 136–145)
TOTAL PROTEIN: 7 g/dL (ref 6.4–8.3)

## 2016-05-13 LAB — LACTATE DEHYDROGENASE: LDH: 241 U/L (ref 125–245)

## 2016-05-13 MED ORDER — OXYCODONE-ACETAMINOPHEN 5-325 MG PO TABS
ORAL_TABLET | ORAL | Status: AC
  Filled 2016-05-13: qty 2

## 2016-05-13 MED ORDER — PALONOSETRON HCL INJECTION 0.25 MG/5ML
0.2500 mg | Freq: Once | INTRAVENOUS | Status: AC
  Administered 2016-05-13: 0.25 mg via INTRAVENOUS

## 2016-05-13 MED ORDER — DEXAMETHASONE SODIUM PHOSPHATE 10 MG/ML IJ SOLN
INTRAMUSCULAR | Status: AC
  Filled 2016-05-13: qty 1

## 2016-05-13 MED ORDER — SODIUM CHLORIDE 0.9 % IV SOLN
Freq: Once | INTRAVENOUS | Status: AC
  Administered 2016-05-13: 10:00:00 via INTRAVENOUS

## 2016-05-13 MED ORDER — PALONOSETRON HCL INJECTION 0.25 MG/5ML
INTRAVENOUS | Status: AC
  Filled 2016-05-13: qty 5

## 2016-05-13 MED ORDER — HEPARIN SOD (PORK) LOCK FLUSH 100 UNIT/ML IV SOLN
500.0000 [IU] | Freq: Once | INTRAVENOUS | Status: AC | PRN
  Administered 2016-05-13: 500 [IU]
  Filled 2016-05-13: qty 5

## 2016-05-13 MED ORDER — MORPHINE SULFATE ER 15 MG PO TBCR: 15.0000 mg | EXTENDED_RELEASE_TABLET | Freq: Two times a day (BID) | ORAL | 0 refills | Status: DC

## 2016-05-13 MED ORDER — SODIUM CHLORIDE 0.9% FLUSH
10.0000 mL | INTRAVENOUS | Status: DC | PRN
  Administered 2016-05-13: 10 mL
  Filled 2016-05-13: qty 10

## 2016-05-13 MED ORDER — OXYCODONE-ACETAMINOPHEN 5-325 MG PO TABS
2.0000 | ORAL_TABLET | Freq: Once | ORAL | Status: AC
  Administered 2016-05-13: 2 via ORAL

## 2016-05-13 MED ORDER — DEXTROSE 5 % IV SOLN
27.0000 mg/m2 | Freq: Once | INTRAVENOUS | Status: AC
  Administered 2016-05-13: 56 mg via INTRAVENOUS
  Filled 2016-05-13: qty 28

## 2016-05-13 MED ORDER — CYCLOPHOSPHAMIDE CHEMO INJECTION 1 GM
300.0000 mg/m2 | Freq: Once | INTRAMUSCULAR | Status: AC
  Administered 2016-05-13: 620 mg via INTRAVENOUS
  Filled 2016-05-13: qty 31

## 2016-05-13 MED ORDER — HYDROCODONE-ACETAMINOPHEN 7.5-325 MG PO TABS: 1.0000 | ORAL_TABLET | Freq: Four times a day (QID) | ORAL | 0 refills | Status: DC | PRN

## 2016-05-13 MED ORDER — DEXAMETHASONE SODIUM PHOSPHATE 10 MG/ML IJ SOLN
10.0000 mg | Freq: Once | INTRAMUSCULAR | Status: AC
  Administered 2016-05-13: 10 mg via INTRAVENOUS

## 2016-05-13 NOTE — Progress Notes (Signed)
Platelets 71, WBC 2.6 and Crt 1.2; okay to treat per Dr. Julien Nordmann.

## 2016-05-13 NOTE — Telephone Encounter (Signed)
Called at 8:32 am with car trouble.  "I will be there within ten minutes."  Appointment note entered that she is on her way now.

## 2016-05-13 NOTE — Patient Instructions (Signed)
[facility] Discharge Instructions for Patients Receiving Chemotherapy  Today you received the following chemotherapy agents:  Kyprolis and Cytoxan.  To help prevent nausea and vomiting after your treatment, we encourage you to take your nausea medication as directed.   If you develop nausea and vomiting that is not controlled by your nausea medication, call the clinic.   BELOW ARE SYMPTOMS THAT SHOULD BE REPORTED IMMEDIATELY:  *FEVER GREATER THAN 100.5 F  *CHILLS WITH OR WITHOUT FEVER  NAUSEA AND VOMITING THAT IS NOT CONTROLLED WITH YOUR NAUSEA MEDICATION  *UNUSUAL SHORTNESS OF BREATH  *UNUSUAL BRUISING OR BLEEDING  TENDERNESS IN MOUTH AND THROAT WITH OR WITHOUT PRESENCE OF ULCERS  *URINARY PROBLEMS  *BOWEL PROBLEMS  UNUSUAL RASH Items with * indicate a potential emergency and should be followed up as soon as possible.  Feel free to call the clinic you have any questions or concerns. The clinic phone number is [phone].  Please show the CHEMO ALERT CARD at check-in to the Emergency Department and triage nurse.   

## 2016-05-14 ENCOUNTER — Ambulatory Visit (HOSPITAL_BASED_OUTPATIENT_CLINIC_OR_DEPARTMENT_OTHER): Payer: Medicare Other

## 2016-05-14 ENCOUNTER — Encounter: Payer: Self-pay | Admitting: Nurse Practitioner

## 2016-05-14 ENCOUNTER — Encounter: Payer: Medicare Other | Admitting: Nutrition

## 2016-05-14 VITALS — BP 149/78 | HR 90 | Temp 98.7°F | Resp 16

## 2016-05-14 DIAGNOSIS — C9002 Multiple myeloma in relapse: Secondary | ICD-10-CM | POA: Diagnosis not present

## 2016-05-14 DIAGNOSIS — Z5112 Encounter for antineoplastic immunotherapy: Secondary | ICD-10-CM | POA: Diagnosis not present

## 2016-05-14 DIAGNOSIS — G893 Neoplasm related pain (acute) (chronic): Secondary | ICD-10-CM | POA: Insufficient documentation

## 2016-05-14 LAB — BETA 2 MICROGLOBULIN, SERUM: BETA 2: 3.9 mg/L — AB (ref 0.6–2.4)

## 2016-05-14 LAB — IGG, IGA, IGM
IgA, Qn, Serum: 33 mg/dL — ABNORMAL LOW (ref 87–352)
IgM, Qn, Serum: <5 mg/dL — ABNORMAL LOW (ref 26–217)

## 2016-05-14 LAB — KAPPA/LAMBDA LIGHT CHAINS
Ig Kappa Free Light Chain: 6.7 mg/L (ref 3.3–19.4)
Ig Lambda Free Light Chain: 129 mg/L — ABNORMAL HIGH (ref 5.7–26.3)
Kappa/Lambda FluidC Ratio: 0.05 — ABNORMAL LOW (ref 0.26–1.65)

## 2016-05-14 MED ORDER — SODIUM CHLORIDE 0.9% FLUSH
10.0000 mL | INTRAVENOUS | Status: DC | PRN
  Administered 2016-05-14: 10 mL
  Filled 2016-05-14: qty 10

## 2016-05-14 MED ORDER — DEXAMETHASONE SODIUM PHOSPHATE 10 MG/ML IJ SOLN
10.0000 mg | Freq: Once | INTRAMUSCULAR | Status: AC
  Administered 2016-05-14: 10 mg via INTRAVENOUS

## 2016-05-14 MED ORDER — DEXAMETHASONE SODIUM PHOSPHATE 10 MG/ML IJ SOLN
INTRAMUSCULAR | Status: AC
  Filled 2016-05-14: qty 1

## 2016-05-14 MED ORDER — HEPARIN SOD (PORK) LOCK FLUSH 100 UNIT/ML IV SOLN
500.0000 [IU] | Freq: Once | INTRAVENOUS | Status: AC | PRN
  Administered 2016-05-14: 500 [IU]
  Filled 2016-05-14: qty 5

## 2016-05-14 MED ORDER — SODIUM CHLORIDE 0.9 % IV SOLN: Freq: Once | INTRAVENOUS | Status: DC

## 2016-05-14 MED ORDER — SODIUM CHLORIDE 0.9 % IV SOLN
Freq: Once | INTRAVENOUS | Status: AC
  Administered 2016-05-14: 09:00:00 via INTRAVENOUS

## 2016-05-14 MED ORDER — CARFILZOMIB CHEMO INJECTION 60 MG
27.0000 mg/m2 | Freq: Once | INTRAVENOUS | Status: AC
  Administered 2016-05-14: 56 mg via INTRAVENOUS
  Filled 2016-05-14: qty 28

## 2016-05-14 NOTE — Progress Notes (Signed)
SYMPTOM MANAGEMENT CLINIC    Chief Complaint: Pain  HPI:  [name] 65 y.o. female diagnosed with multiple myeloma with bone metastasis.  Currently undergoing Cytoxan/Kyprolis chemotherapy regimen.    No history exists.    Review of Systems  Musculoskeletal: Positive for back pain and myalgias.  All other systems reviewed and are negative.   Past Medical History:  Diagnosis Date  . Compression fracture 04/08/2007   pathologic compression fracture  . Constipation    takes oxycontin,vicodin  . Family history of anesthesia complication    "daughter gets PONV too"  . FHx: chemotherapy    s/p 5 cycle revlimid/low dose decadron,s/p velcade,doxil,decadron,  . GERD (gastroesophageal reflux disease)   . 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 Hx of radiation therapy 05/05/07-05/18/07,& 03/05/11-03/21/11-   l3&l5 in 2008, t2-t6 03/2011  . Hx of radiation therapy 05/05/2007 to 05/18/2007   palliative, L3-5  . Hx of radiation therapy 03/05/2011 to 03/21/2011   palliative T2-T6, c-spine  . Hypercholesterolemia   . Hypothyroidism   . Insomnia    associated with steroids  . Metastasis to bone ([place])   . Multiple myeloma ([place]) dx'd 2009  . Pneumonia    "several times"  . PONV (postoperative nausea and vomiting)   . Stroke [facility]) 2014   denies residual on 01/27/2014    Past Surgical History:  Procedure Laterality Date  . CHOLECYSTECTOMY  1980's  . KNEE ARTHROSCOPY Right    "put pin in"  . KNEE ARTHROSCOPY Right    "took pin out and corrected what was wrong"  . PORTACATH PLACEMENT Right 2009  . VAGINAL HYSTERECTOMY  1980's  . VIDEO BRONCHOSCOPY  07/30/2011   Procedure: VIDEO BRONCHOSCOPY WITHOUT FLUORO;  Surgeon: Kathee Delton, MD;  Location: [facility];  Service: Cardiopulmonary;  Laterality: Bilateral;    has Multiple myeloma in relapse ([place]); Hypothyroidism; GERD; Essential hypertension; History of autologous stem cell  transplant ([place]); Hyperlipidemia; Medication management; Vitamin D deficiency; CKD stage 3 due to type 2 diabetes mellitus ([place]); Pain and swelling of left lower extremity; Encounter for antineoplastic chemotherapy; Lytic bone lesions on xray; Dehydration; Peripheral edema; Nausea with vomiting; Bronchitis; Hypomagnesemia; Diarrhea; Dizzy spells; HCAP (healthcare-associated pneumonia); Weakness generalized; Edema; and Cancer associated pain on her problem list.    has No Known Allergies.    Medication List       Accurate as of 05/13/16 11:59 PM. Always use your most recent med list.          albuterol 108 (90 Base) MCG/ACT inhaler Commonly known as:  PROVENTIL HFA;VENTOLIN HFA Inhale 2 puffs into the lungs every 2 (two) hours as needed for wheezing or shortness of breath (cough).   ALPRAZolam 0.5 MG tablet Commonly known as:  XANAX TAKE 1/2 TO 1 TABLET 2 TO 3 TIMES DAILY AS NEEDED FOR ANXIETY   aspirin 81 MG tablet Take 81 mg by mouth as directed. Mondays, Wednesdays, and Fridays   blood glucose meter kit and supplies Test blood sugars once a day   brompheniramine-pseudoephedrine-DM 30-2-10 MG/5ML syrup Take 5 mLs by mouth 4 (four) times daily as needed.   citalopram 40 MG tablet Commonly known as:  CELEXA TAKE 1 TABLET (40 MG TOTAL) BY MOUTH DAILY.   cyclobenzaprine 10 MG tablet Commonly known as:  FLEXERIL TAKE 1 TABLET 3 TIMES A DAY AS NEEDED FOR MUSCLE SPASMS   dexamethasone 4 MG tablet Commonly known as:  DECADRON TAKE (10) TABLETS BY  MOUTH EVERY FRIDAY   diphenoxylate-atropine 2.5-0.025 MG tablet Commonly known as:  LOMOTIL TAKE 2 TABLETS BY MOUTH 4 TIMES A DAY AS NEEDED FOR DIARRHEA   Eszopiclone 3 MG Tabs TAKE 1 TABLET AT BEDTIME AS NEEDED SLEEP   furosemide 40 MG tablet Commonly known as:  LASIX TAKE 1 TABLET BY MOUTH TWICE A DAY FOR FLUID AND SWELLING   gabapentin 600 MG tablet Commonly known as:  NEURONTIN TAKE 1 TABLET 4 X DAILY AS NEEDED FOR PAIN OR  CRAMPS   HYDROcodone-acetaminophen 7.5-325 MG tablet Commonly known as:  NORCO Take 1 tablet by mouth every 6 (six) hours as needed for moderate pain.   levofloxacin 750 MG tablet Commonly known as:  LEVAQUIN Take 1 tablet (750 mg total) by mouth daily. X 7 days   loperamide 2 MG capsule Commonly known as:  IMODIUM Take 2 mg by mouth as needed for diarrhea or loose stools. Reported on 10/02/2015   Magnesium 500 MG Tabs Take 500 mg by mouth 2 (two) times daily.   mometasone 50 MCG/ACT nasal spray Commonly known as:  NASONEX PLACE 2 SPRAYS INTO THE NOSE DAILY.   montelukast 10 MG tablet Commonly known as:  SINGULAIR TAKE 1 TABLET BY MOUTH EVERY DAY   morphine 15 MG 12 hr tablet Commonly known as:  MS CONTIN Take 1 tablet (15 mg total) by mouth every 12 (twelve) hours.   multivitamin with minerals Tabs tablet Take 1 tablet by mouth every morning.   ondansetron 8 MG disintegrating tablet Commonly known as:  ZOFRAN-ODT TAKE 1 TABLET BY MOUTH EVERY 8 HOURS AS NEEDED FOR NAUSEA AND VOMITING   pantoprazole 40 MG tablet Commonly known as:  PROTONIX TAKE 1 TABLET (40 MG TOTAL) BY MOUTH 2 (TWO) TIMES DAILY.   potassium chloride 10 MEQ tablet Commonly known as:  KLOR-CON 10 Take 2 tablets by mouth 2 times daily.   predniSONE 20 MG tablet Commonly known as:  DELTASONE 3 tabs po daily x 3 days, then 2 tabs x 3 days, then 1.5 tabs x 3 days, then 1 tab x 3 days, then 0.5 tabs x 3 days   prochlorperazine 10 MG tablet Commonly known as:  COMPAZINE Take 1 tablet (10 mg total) by mouth every 6 (six) hours as needed for nausea or vomiting.   SYNTHROID 100 MCG tablet Generic drug:  levothyroxine TAKE 1 TABLET BY MOUTH DAILY   traZODone 150 MG tablet Commonly known as:  DESYREL TAKE 1/2 TO 1 TABLET 1 HOUR BEFORE SLEEP.        PHYSICAL EXAMINATION  Oncology Vitals 05/14/2016 05/14/2016  Height - -  Weight - -  Weight (lbs) - -  BMI (kg/m2) - -  Temp - -  Pulse 90 100    Resp 16 16  Resp (Historical as of 01/30/12) - -  SpO2 100 100  BSA (m2) - -   BP Readings from Last 2 Encounters:  05/14/16 (!) 149/78  05/13/16 140/69    Physical Exam  Constitutional: She is oriented to person, place, and time and well-developed, well-nourished, and in no distress.  HENT:  Head: Normocephalic and atraumatic.  Eyes: Conjunctivae and EOM are normal. Pupils are equal, round, and reactive to light.  Neck: Normal range of motion.  Pulmonary/Chest: Effort normal. No respiratory distress.  Musculoskeletal: Normal range of motion.  Neurological: She is alert and oriented to person, place, and time.  Skin: Skin is warm and dry.  Psychiatric: Affect normal.  Nursing note and vitals  reviewed.   LABORATORY DATA:. Appointment on 05/13/2016  Component Date Value Ref Range Status  . WBC 05/13/2016 2.6* 3.9 - 10.3 10e3/uL Final  . NEUT# 05/13/2016 1.6  1.5 - 6.5 10e3/uL Final  . HGB 05/13/2016 9.9* 11.6 - 15.9 g/dL Final  . HCT 05/13/2016 31.0* 34.8 - 46.6 % Final  . Platelets 05/13/2016 71* 145 - 400 10e3/uL Final  . MCV 05/13/2016 105.1* 79.5 - 101.0 fL Final  . MCH 05/13/2016 33.6  25.1 - 34.0 pg Final  . MCHC 05/13/2016 31.9  31.5 - 36.0 g/dL Final  . RBC 05/13/2016 2.95* 3.70 - 5.45 10e6/uL Final  . RDW 05/13/2016 18.5* 11.2 - 14.5 % Final  . lymph# 05/13/2016 0.7* 0.9 - 3.3 10e3/uL Final  . MONO# 05/13/2016 0.3  0.1 - 0.9 10e3/uL Final  . Eosinophils Absolute 05/13/2016 0.0  0.0 - 0.5 10e3/uL Final  . Basophils Absolute 05/13/2016 0.0  0.0 - 0.1 10e3/uL Final  . NEUT% 05/13/2016 61.4  38.4 - 76.8 % Final  . LYMPH% 05/13/2016 27.8  14.0 - 49.7 % Final  . MONO% 05/13/2016 10.0  0.0 - 14.0 % Final  . EOS% 05/13/2016 0.8  0.0 - 7.0 % Final  . BASO% 05/13/2016 0.0  0.0 - 2.0 % Final  . Sodium 05/13/2016 139  136 - 145 mEq/L Final  . Potassium 05/13/2016 4.1  3.5 - 5.1 mEq/L Final  . Chloride 05/13/2016 106  98 - 109 mEq/L Final  . CO2 05/13/2016 23  22 - 29 mEq/L  Final  . Glucose 05/13/2016 137  70 - 140 mg/dl Final  . BUN 05/13/2016 16.4  7.0 - 26.0 mg/dL Final  . Creatinine 05/13/2016 1.2* 0.6 - 1.1 mg/dL Final  . Total Bilirubin 05/13/2016 1.04  0.20 - 1.20 mg/dL Final  . Alkaline Phosphatase 05/13/2016 108  40 - 150 U/L Final  . AST 05/13/2016 17  5 - 34 U/L Final  . ALT 05/13/2016 23  0 - 55 U/L Final  . Total Protein 05/13/2016 7.0  6.4 - 8.3 g/dL Final  . Albumin 05/13/2016 3.0* 3.5 - 5.0 g/dL Final  . Calcium 05/13/2016 10.1  8.4 - 10.4 mg/dL Final  . Anion Gap 05/13/2016 9  3 - 11 mEq/L Final  . EGFR 05/13/2016 53* >90 ml/min/1.73 m2 Final  . LDH 05/13/2016 241  125 - 245 U/L Final  . Beta-2 05/14/2016 3.9* 0.6 - 2.4 mg/L Final  . IgG, Qn, Serum 05/14/2016 1,305  700 - 1600 mg/dL Final  . IgA, Qn, Serum 05/14/2016 33* 87 - 352 mg/dL Final  . IgM, Qn, Serum 05/14/2016 <5* 26 - 217 mg/dL Final  . Ig Kappa Free Light Chain 05/14/2016 6.7  3.3 - 19.4 mg/L Final  . Ig Lambda Free Light Chain 05/14/2016 129.0* 5.7 - 26.3 mg/L Final  . Kappa/Lambda FluidC Ratio 05/14/2016 0.05* 0.26 - 1.65 Final    RADIOGRAPHIC STUDIES: No results found.  ASSESSMENT/PLAN:    Multiple myeloma in relapse [facility]) Patient presented to the [facility] today to receive cycle 2, day 16 of the Kyprolis portion of her Cytoxan/Kyprolis chemotherapy regimen.  She is scheduled to return on 05/14/2016 for her second day of chemotherapy.  She is also scheduled to return for labs, visit, and her next infusion on 05/27/2016.  Cancer associated pain Patient states that she is experiencing some generalized discomfort; with specific pain to her back into her hands.  She feels that her hands are cramping.  She states that she has run  out of her hydrocodone; he needs a refill.  She denies any other new issues whatsoever.  She denies any recent fevers or chills.  Patient was given Percocet orally while at the [facility] today; which relieved her discomfort.  She was also  given a refill of her hydrocodone pain medication as well.   Patient stated understanding of all instructions; and was in agreement with this plan of care. The patient knows to call the clinic with any problems, questions or concerns.   Total time spent with patient was 15 minutes;  with greater than 75 percent of that time spent in face to face counseling regarding patient's symptoms,  and coordination of care and follow up.  Disclaimer:This dictation was prepared with Dragon/digital dictation along with [employer]. Any transcriptional errors that result from this process are unintentional.  Drue Second, NP 05/14/2016

## 2016-05-14 NOTE — Progress Notes (Signed)
Nutrition Assessment   Reason for Assessment: Patient with questions regarding diet for increased diarrhea. Seen in infusion this am  ASSESSMENT:  65 year old female with recurrent multiple myeloma. First diagnosed in 2008. History includes chemoradiation therapy and stem cell transplant.    Past medical history of hypercholesterolemia, hypothyroidism, GERD, stroke.   Patient reports no nausea, vomiting, good appetite. No diarrhea since Sunday of this week.  Report that she is lactose intolerant but still eats butter pecan ice cream at times, uses lactose free milk.   Medications: decadron weekly, lasix, imodium, MVI  Labs: creatinine 1.2 (11/13)  Anthropometrics:   Height: 64 inches Weight: 214 lb UBW: 175-178 lb BMI: 35.9   Estimated Energy Needs  Kcals: 2100-2400 kcals/d Protein: 97-116 g/d Fluid: >/= 2.4 L/d  NUTRITION DIAGNOSIS: Food and nutrition related knowledge deficit related to cancer related treatment side effect of diarrhea as evidenced by patient lack of knowledge regarding foods to eat and avoid during diarrhea.   INTERVENTION:   Discussed and provided handout on recommended foods to eat and avoid during diarrhea. Encouraged use of lactose free products to decrease chance of diarrhea as well including ice cream.   Patient and daughter verbalized understanding of information.  Teach back used.      MONITORING, EVALUATION, GOAL: Patient will choose nutritious foods that will lessen effects of diarrhea.    NEXT VISIT:  As needed  Jesica Goheen B. Zenia Resides, [place], [place] (pager)

## 2016-05-14 NOTE — Assessment & Plan Note (Signed)
Patient presented to the [facility] today to receive cycle 2, day 16 of the Kyprolis portion of her Cytoxan/Kyprolis chemotherapy regimen.  She is scheduled to return on 05/14/2016 for her second day of chemotherapy.  She is also scheduled to return for labs, visit, and her next infusion on 05/27/2016.

## 2016-05-14 NOTE — Assessment & Plan Note (Signed)
Patient states that she is experiencing some generalized discomfort; with specific pain to her back into her hands.  She feels that her hands are cramping.  She states that she has run out of her hydrocodone; he needs a refill.  She denies any other new issues whatsoever.  She denies any recent fevers or chills.  Patient was given Percocet orally while at the [facility] today; which relieved her discomfort.  She was also given a refill of her hydrocodone pain medication as well.

## 2016-05-14 NOTE — Patient Instructions (Signed)
[facility] Discharge Instructions for Patients Receiving Chemotherapy  Today you received the following chemotherapy agents: Kyprolis   To help prevent nausea and vomiting after your treatment, we encourage you to take your nausea medication as directed.    If you develop nausea and vomiting that is not controlled by your nausea medication, call the clinic.   BELOW ARE SYMPTOMS THAT SHOULD BE REPORTED IMMEDIATELY:  *FEVER GREATER THAN 100.5 F  *CHILLS WITH OR WITHOUT FEVER  NAUSEA AND VOMITING THAT IS NOT CONTROLLED WITH YOUR NAUSEA MEDICATION  *UNUSUAL SHORTNESS OF BREATH  *UNUSUAL BRUISING OR BLEEDING  TENDERNESS IN MOUTH AND THROAT WITH OR WITHOUT PRESENCE OF ULCERS  *URINARY PROBLEMS  *BOWEL PROBLEMS  UNUSUAL RASH Items with * indicate a potential emergency and should be followed up as soon as possible.  Feel free to call the clinic you have any questions or concerns. The clinic phone number is [phone].  Please show the CHEMO ALERT CARD at check-in to the Emergency Department and triage nurse.   

## 2016-05-16 ENCOUNTER — Encounter: Payer: Self-pay | Admitting: Nutrition

## 2016-05-16 NOTE — Progress Notes (Signed)
Patient called me to request a third copy of diarrhea fact sheet. Patient reports she has lost it and would like another one mailed to her. Placed diarrhea fact sheet in the mail to her today.

## 2016-05-18 ENCOUNTER — Other Ambulatory Visit: Payer: Self-pay | Admitting: Internal Medicine

## 2016-05-18 DIAGNOSIS — E119 Type 2 diabetes mellitus without complications: Secondary | ICD-10-CM

## 2016-05-20 ENCOUNTER — Other Ambulatory Visit: Payer: Self-pay | Admitting: Internal Medicine

## 2016-05-20 ENCOUNTER — Other Ambulatory Visit (HOSPITAL_BASED_OUTPATIENT_CLINIC_OR_DEPARTMENT_OTHER): Payer: Medicare Other

## 2016-05-20 DIAGNOSIS — C9002 Multiple myeloma in relapse: Secondary | ICD-10-CM | POA: Diagnosis not present

## 2016-05-20 DIAGNOSIS — F419 Anxiety disorder, unspecified: Secondary | ICD-10-CM

## 2016-05-20 LAB — CBC WITH DIFFERENTIAL/PLATELET
BASO%: 0 % (ref 0.0–2.0)
Basophils Absolute: 0 10*3/uL (ref 0.0–0.1)
EOS%: 0 % (ref 0.0–7.0)
Eosinophils Absolute: 0 10*3/uL (ref 0.0–0.5)
HCT: 29.3 % — ABNORMAL LOW (ref 34.8–46.6)
HGB: 9.5 g/dL — ABNORMAL LOW (ref 11.6–15.9)
LYMPH%: 28.3 % (ref 14.0–49.7)
MCH: 34.1 pg — ABNORMAL HIGH (ref 25.1–34.0)
MCHC: 32.4 g/dL (ref 31.5–36.0)
MCV: 105 fL — ABNORMAL HIGH (ref 79.5–101.0)
MONO#: 0.3 10*3/uL (ref 0.1–0.9)
MONO%: 14.6 % — ABNORMAL HIGH (ref 0.0–14.0)
NEUT#: 1.1 10*3/uL — ABNORMAL LOW (ref 1.5–6.5)
NEUT%: 57.1 % (ref 38.4–76.8)
Platelets: 64 10*3/uL — ABNORMAL LOW (ref 145–400)
RBC: 2.79 10*6/uL — ABNORMAL LOW (ref 3.70–5.45)
RDW: 17.5 % — ABNORMAL HIGH (ref 11.2–14.5)
WBC: 2 10*3/uL — ABNORMAL LOW (ref 3.9–10.3)
lymph#: 0.6 10*3/uL — ABNORMAL LOW (ref 0.9–3.3)
nRBC: 1 % — ABNORMAL HIGH (ref 0–0)

## 2016-05-20 LAB — COMPREHENSIVE METABOLIC PANEL
ALT: 29 U/L (ref 0–55)
AST: 15 U/L (ref 5–34)
Albumin: 2.9 g/dL — ABNORMAL LOW (ref 3.5–5.0)
Alkaline Phosphatase: 92 U/L (ref 40–150)
Anion Gap: 6 mEq/L (ref 3–11)
BUN: 5.8 mg/dL — ABNORMAL LOW (ref 7.0–26.0)
CO2: 23 mEq/L (ref 22–29)
Calcium: 8.3 mg/dL — ABNORMAL LOW (ref 8.4–10.4)
Chloride: 110 mEq/L — ABNORMAL HIGH (ref 98–109)
Creatinine: 0.8 mg/dL (ref 0.6–1.1)
EGFR: >90 mL/min/{1.73_m2} (ref >90)
Glucose: 95 mg/dl (ref 70–140)
Potassium: 4.2 mEq/L (ref 3.5–5.1)
Sodium: 139 mEq/L (ref 136–145)
Total Bilirubin: 1.26 mg/dL — ABNORMAL HIGH (ref 0.20–1.20)
Total Protein: 6.7 g/dL (ref 6.4–8.3)

## 2016-05-20 NOTE — Telephone Encounter (Signed)
Please call Alprazolam 

## 2016-05-22 ENCOUNTER — Other Ambulatory Visit: Payer: Self-pay | Admitting: Internal Medicine

## 2016-05-22 ENCOUNTER — Other Ambulatory Visit: Payer: Self-pay | Admitting: *Deleted

## 2016-05-22 DIAGNOSIS — R609 Edema, unspecified: Secondary | ICD-10-CM

## 2016-05-22 MED ORDER — GABAPENTIN 600 MG PO TABS: ORAL_TABLET | ORAL | 1 refills | Status: DC

## 2016-05-25 ENCOUNTER — Encounter (HOSPITAL_COMMUNITY): Payer: Self-pay | Admitting: Nurse Practitioner

## 2016-05-25 ENCOUNTER — Emergency Department (HOSPITAL_COMMUNITY)
Admission: EM | Admit: 2016-05-25 | Discharge: 2016-05-25 | Disposition: A | Discharge status: Home / Self Care | Payer: Medicare Other | Attending: Emergency Medicine | Admitting: Emergency Medicine

## 2016-05-25 ENCOUNTER — Other Ambulatory Visit: Payer: Self-pay | Admitting: Physician Assistant

## 2016-05-25 ENCOUNTER — Other Ambulatory Visit: Payer: Self-pay | Admitting: Internal Medicine

## 2016-05-25 DIAGNOSIS — Z87891 Personal history of nicotine dependence: Secondary | ICD-10-CM | POA: Diagnosis not present

## 2016-05-25 DIAGNOSIS — Z8673 Personal history of transient ischemic attack (TIA), and cerebral infarction without residual deficits: Secondary | ICD-10-CM | POA: Insufficient documentation

## 2016-05-25 DIAGNOSIS — E039 Hypothyroidism, unspecified: Secondary | ICD-10-CM | POA: Diagnosis not present

## 2016-05-25 DIAGNOSIS — E1122 Type 2 diabetes mellitus with diabetic chronic kidney disease: Secondary | ICD-10-CM | POA: Insufficient documentation

## 2016-05-25 DIAGNOSIS — J069 Acute upper respiratory infection, unspecified: Secondary | ICD-10-CM | POA: Diagnosis not present

## 2016-05-25 DIAGNOSIS — N183 Chronic kidney disease, stage 3 (moderate): Secondary | ICD-10-CM | POA: Diagnosis not present

## 2016-05-25 DIAGNOSIS — I129 Hypertensive chronic kidney disease with stage 1 through stage 4 chronic kidney disease, or unspecified chronic kidney disease: Secondary | ICD-10-CM | POA: Insufficient documentation

## 2016-05-25 DIAGNOSIS — Z7982 Long term (current) use of aspirin: Secondary | ICD-10-CM | POA: Insufficient documentation

## 2016-05-25 DIAGNOSIS — J029 Acute pharyngitis, unspecified: Secondary | ICD-10-CM | POA: Diagnosis present

## 2016-05-25 LAB — RAPID STREP SCREEN (MED CTR MEBANE ONLY): Streptococcus, Group A Screen (Direct): NEGATIVE

## 2016-05-25 NOTE — ED Notes (Signed)
ED Provider at bedside. EDP ALLEN 

## 2016-05-25 NOTE — ED Provider Notes (Signed)
[facility] DEPT Provider Note   CSN: [id number] Arrival date & time: 05/25/16  8341     History   Chief Complaint Chief Complaint  Patient presents with  . Sore Throat    HPI [name] is a 65 y.o. female.  65 year old female presents with 24-hour history of scratchy throat without fever or chills. No cough or congestion but has had URI symptoms and complaints of some sinus pressure. Notes positive sick exposures. Has also complained of some lower back pain but she has no history of a compression fracture at that same region. Denies any headache, photophobia, urinary symptoms. Chest or abdominal discomfort. Symptoms persistent and no treatment use prior to arrival.      Past Medical History:  Diagnosis Date  . Compression fracture 04/08/2007   pathologic compression fracture  . Constipation    takes oxycontin,vicodin  . Family history of anesthesia complication    "daughter gets PONV too"  . FHx: chemotherapy    s/p 5 cycle revlimid/low dose decadron,s/p velcade,doxil,decadron,  . GERD (gastroesophageal reflux disease)   . 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 Hx of radiation therapy 05/05/07-05/18/07,& 03/05/11-03/21/11-   l3&l5 in 2008, t2-t6 03/2011  . Hx of radiation therapy 05/05/2007 to 05/18/2007   palliative, L3-5  . Hx of radiation therapy 03/05/2011 to 03/21/2011   palliative T2-T6, c-spine  . Hypercholesterolemia   . Hypothyroidism   . Insomnia    associated with steroids  . Metastasis to bone ([place])   . Multiple myeloma ([place]) dx'd 2009  . Pneumonia    "several times"  . PONV (postoperative nausea and vomiting)   . Stroke [facility]) 2014   denies residual on 01/27/2014    Patient Active Problem List   Diagnosis Date Noted  . 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 Weakness generalized 02/02/2016  . Edema 02/02/2016  . HCAP (healthcare-associated pneumonia) 01/09/2016  . Dizzy spells 12/29/2015  . Diarrhea  11/22/2015  . Hypomagnesemia 11/11/2015  . Bronchitis 09/24/2015  . Peripheral edema 08/29/2015  . Nausea with vomiting 08/29/2015  . Dehydration 07/09/2015  . Lytic bone lesions on xray 06/12/2015  . Encounter for antineoplastic chemotherapy 01/16/2015  . Pain and swelling of left lower extremity 12/15/2014  . Medication management 11/04/2013  . Vitamin D deficiency 11/04/2013  . 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 Hyperlipidemia 02/24/2013  . History of autologous stem cell transplant ([place])   . Multiple myeloma in relapse ([place]) 01/12/2010  . Hypothyroidism 01/12/2010  . GERD 01/12/2010  . Essential hypertension 01/12/2010    Past Surgical History:  Procedure Laterality Date  . CHOLECYSTECTOMY  1980's  . KNEE ARTHROSCOPY Right    "put pin in"  . KNEE ARTHROSCOPY Right    "took pin out and corrected what was wrong"  . PORTACATH PLACEMENT Right 2009  . VAGINAL HYSTERECTOMY  1980's  . VIDEO BRONCHOSCOPY  07/30/2011   Procedure: VIDEO BRONCHOSCOPY WITHOUT FLUORO;  Surgeon: Kathee Delton, MD;  Location: [facility] ENDOSCOPY;  Service: Cardiopulmonary;  Laterality: Bilateral;    OB History    No data available       Home Medications    Prior to Admission medications   Medication Sig Start Date End Date Taking? Authorizing Provider  albuterol (PROVENTIL HFA;VENTOLIN HFA) 108 (90 Base) MCG/ACT inhaler Inhale 2 puffs into the lungs every 2 (two) hours as needed for wheezing or shortness of breath (cough). 04/18/16  Courtney Forcucci, [name]  ALPRAZolam (XANAX) 0.5 MG tablet TAKE 1/2 TO 1 TABLET 2 TO 3 TIMES DAILY AS NEEDED FOR ANXIETY 05/20/16   Unk Pinto, MD  aspirin 81 MG tablet Take 81 mg by mouth as directed. Mondays, Wednesdays, and Fridays    Historical Provider, MD  blood glucose meter kit and supplies Test blood sugars once a day 02/21/16   Unk Pinto, MD  brompheniramine-pseudoephedrine-DM 30-2-10 MG/5ML syrup Take 5 mLs by mouth 4 (four)  times daily as needed. 04/18/16   Courtney Forcucci, [name]  citalopram (CELEXA) 40 MG tablet TAKE 1 TABLET (40 MG TOTAL) BY MOUTH DAILY. 12/25/15   Unk Pinto, MD  cyclobenzaprine (FLEXERIL) 10 MG tablet TAKE 1 TABLET 3 TIMES A DAY AS NEEDED FOR MUSCLE SPASMS 03/22/16   Vicie Mutters, [name]  dexamethasone (DECADRON) 4 MG tablet TAKE (10) TABLETS BY MOUTH EVERY FRIDAY 03/27/16   Curt Bears, MD  diphenoxylate-atropine (LOMOTIL) 2.5-0.025 MG tablet TAKE 2 TABLETS BY MOUTH 4 TIMES A DAY AS NEEDED FOR DIARRHEA Patient not taking: Reported on 04/22/2016 09/21/15   Susanne Borders, NP  Eszopiclone 3 MG TABS TAKE 1 TABLET AT BEDTIME AS NEEDED SLEEP 04/18/16   Courtney Forcucci, [name]  furosemide (LASIX) 40 MG tablet TAKE 1 TABLET BY MOUTH TWICE A DAY FOR FLUID AND SWELLING 05/10/16   Unk Pinto, MD  gabapentin (NEURONTIN) 600 MG tablet TAKE 1 TABLET BY MOUTH 4 TIMES A DAY AS NEEDED FOR PAIN OR CRAMPS 05/22/16   Unk Pinto, MD  HYDROcodone-acetaminophen (NORCO) 7.5-325 MG tablet Take 1 tablet by mouth every 6 (six) hours as needed for moderate pain. 05/13/16   Susanne Borders, NP  levofloxacin (LEVAQUIN) 750 MG tablet Take 1 tablet (750 mg total) by mouth daily. X 7 days 04/18/16   Starlyn Skeans, [name]  loperamide (IMODIUM) 2 MG capsule Take 2 mg by mouth as needed for diarrhea or loose stools. Reported on 10/02/2015    Historical Provider, MD  Magnesium 500 MG TABS Take 500 mg by mouth 2 (two) times daily.     Historical Provider, MD  metolazone (ZAROXOLYN) 2.5 MG tablet PLEASE TAKE 1 TABLET ON WEDNESDAY AND SUNDAY IN THE MORNING FOR SWELLING. 05/22/16   Unk Pinto, MD  mometasone (NASONEX) 50 MCG/ACT nasal spray PLACE 2 SPRAYS INTO THE NOSE DAILY. Patient taking differently: PLACE 2 SPRAYS INTO THE NOSE DAILY AS NEEDED FOR ALLERGIES. 01/08/16   Unk Pinto, MD  montelukast (SINGULAIR) 10 MG tablet TAKE 1 TABLET BY MOUTH EVERY DAY 02/24/16   Unk Pinto, MD  morphine (MS CONTIN) 15  MG 12 hr tablet Take 1 tablet (15 mg total) by mouth every 12 (twelve) hours. 05/13/16   Susanne Borders, NP  Multiple Vitamin (MULITIVITAMIN WITH MINERALS) TABS Take 1 tablet by mouth every morning.     Historical Provider, MD  ondansetron (ZOFRAN-ODT) 8 MG disintegrating tablet TAKE 1 TABLET BY MOUTH EVERY 8 HOURS AS NEEDED FOR NAUSEA AND VOMITING 12/26/15   Unk Pinto, MD  ONE TOUCH ULTRA TEST test strip TEST BLOOD SUGARS ONCE DAILY 05/18/16 05/18/17  Unk Pinto, MD  pantoprazole (PROTONIX) 40 MG tablet TAKE 1 TABLET (40 MG TOTAL) BY MOUTH 2 (TWO) TIMES DAILY. Patient taking differently: Take 40 mg by mouth daily.  01/04/16   Vicie Mutters, [name]  potassium chloride (KLOR-CON 10) 10 MEQ tablet Take 2 tablets by mouth 2 times daily. 04/03/16   Unk Pinto, MD  predniSONE (DELTASONE) 20 MG tablet 3 tabs po daily x 3 days,  then 2 tabs x 3 days, then 1.5 tabs x 3 days, then 1 tab x 3 days, then 0.5 tabs x 3 days 04/18/16   Starlyn Skeans, [name]  prochlorperazine (COMPAZINE) 10 MG tablet Take 1 tablet (10 mg total) by mouth every 6 (six) hours as needed for nausea or vomiting. 03/25/16   Curt Bears, MD  SYNTHROID 100 MCG tablet TAKE 1 TABLET BY MOUTH DAILY 02/17/16   Unk Pinto, MD  traZODone (DESYREL) 150 MG tablet TAKE 1/2 TO 1 TABLET 1 HOUR BEFORE SLEEP. 03/04/16   Unk Pinto, MD    Family History Family History  Problem Relation Age of Onset  . Lung cancer Brother   . Colon cancer Maternal Uncle   . Breast cancer Maternal Grandmother     Social History Social History  Substance Use Topics  . Smoking status: Former Smoker    Packs/day: 0.25    Years: 6.00    Types: Cigarettes    Quit date: 08/27/1978  . Smokeless tobacco: Never Used  . Alcohol use No     Comment: "quit drinking in the 1980's", previously drank on the weekend     Allergies   Patient has no known allergies.   Review of Systems Review of Systems  All other systems reviewed and are  negative.    Physical Exam Updated Vital Signs BP (!) 141/119 (BP Location: Right Arm)   Pulse 105   Temp 98.7 F (37.1 C) (Oral)   Resp 16   Ht 5' 5"  (1.651 m)   Wt 95.7 kg   SpO2 97%   BMI 35.11 kg/m   Physical Exam  Constitutional: She is oriented to person, place, and time. She appears well-developed and well-nourished.  Non-toxic appearance. No distress.  HENT:  Head: Normocephalic and atraumatic.  Mouth/Throat: No oropharyngeal exudate or posterior oropharyngeal erythema.  Eyes: Conjunctivae, EOM and lids are normal. Pupils are equal, round, and reactive to light.  Neck: Normal range of motion. Neck supple. No tracheal deviation present. No thyroid mass present.  Cardiovascular: Normal rate, regular rhythm and normal heart sounds.  Exam reveals no gallop.   No murmur heard. Pulmonary/Chest: Effort normal and breath sounds normal. No stridor. No respiratory distress. She has no decreased breath sounds. She has no wheezes. She has no rhonchi. She has no rales.  Abdominal: Soft. Normal appearance and bowel sounds are normal. She exhibits no distension. There is no tenderness. There is no rebound and no CVA tenderness.  Musculoskeletal: Normal range of motion. She exhibits no edema or tenderness.       Back:  Neurological: She is alert and oriented to person, place, and time. She has normal strength. She displays no atrophy. No cranial nerve deficit or sensory deficit. GCS eye subscore is 4. GCS verbal subscore is 5. GCS motor subscore is 6.  Skin: Skin is warm and dry. No abrasion and no rash noted.  Psychiatric: She has a normal mood and affect. Her speech is normal and behavior is normal.  Nursing note and vitals reviewed.    ED Treatments / Results  Labs (all labs ordered are listed, but only abnormal results are displayed) Labs Reviewed  RAPID STREP SCREEN (NOT AT [facility])    EKG  EKG Interpretation None       Radiology No results  found.  Procedures Procedures (including critical care time)  Medications Ordered in ED Medications - No data to display   Initial Impression / Assessment and Plan / ED Course  I  have reviewed the triage vital signs and the nursing notes.  Pertinent labs & imaging results that were available during my care of the patient were reviewed by me and considered in my medical decision making (see chart for details).  Clinical Course     Pt With negative strep test. Suspect URI. Return precautions given  Final Clinical Impressions(s) / ED Diagnoses   Final diagnoses:  None    New Prescriptions New Prescriptions   No medications on file     Lacretia Leigh, MD 05/25/16 1100

## 2016-05-25 NOTE — ED Triage Notes (Signed)
Pt presents to [facility]ED for complaints of sore throat that began last night. Associated symptoms include cough, mouth dryness, inflamed mucosal tissues, and dysphagia. She took cough syrup and a cough drop which improved those symptoms only. She also took hydrocodone for pain which she felt only made her sleep. She is taking chemo for multiple myeloma.

## 2016-05-25 NOTE — ED Notes (Signed)
ED Provider at bedside. EDP ALLEN TO SPEAK WITH PT. TEMP> 100.4 RETURN TO ED FOR RE EVALUATION. PT AND FAMILY VERBALIZED UNDERSTANDING

## 2016-05-27 ENCOUNTER — Other Ambulatory Visit: Payer: Self-pay | Admitting: Medical Oncology

## 2016-05-27 ENCOUNTER — Other Ambulatory Visit (HOSPITAL_BASED_OUTPATIENT_CLINIC_OR_DEPARTMENT_OTHER): Payer: Medicare Other | Admitting: *Deleted

## 2016-05-27 ENCOUNTER — Ambulatory Visit (HOSPITAL_BASED_OUTPATIENT_CLINIC_OR_DEPARTMENT_OTHER): Payer: Medicare Other | Admitting: Internal Medicine

## 2016-05-27 ENCOUNTER — Ambulatory Visit (HOSPITAL_BASED_OUTPATIENT_CLINIC_OR_DEPARTMENT_OTHER): Payer: Medicare Other

## 2016-05-27 ENCOUNTER — Ambulatory Visit (HOSPITAL_BASED_OUTPATIENT_CLINIC_OR_DEPARTMENT_OTHER): Payer: Self-pay | Admitting: Nurse Practitioner

## 2016-05-27 ENCOUNTER — Encounter: Payer: Self-pay | Admitting: Internal Medicine

## 2016-05-27 ENCOUNTER — Other Ambulatory Visit: Payer: Self-pay | Admitting: Internal Medicine

## 2016-05-27 ENCOUNTER — Other Ambulatory Visit: Payer: Medicare Other

## 2016-05-27 ENCOUNTER — Ambulatory Visit: Payer: Medicare Other

## 2016-05-27 VITALS — BP 126/67 | HR 104 | Temp 98.6°F | Resp 19 | Wt 213.3 lb

## 2016-05-27 VITALS — HR 96

## 2016-05-27 DIAGNOSIS — R42 Dizziness and giddiness: Secondary | ICD-10-CM

## 2016-05-27 DIAGNOSIS — Z5111 Encounter for antineoplastic chemotherapy: Secondary | ICD-10-CM

## 2016-05-27 DIAGNOSIS — C9000 Multiple myeloma not having achieved remission: Secondary | ICD-10-CM

## 2016-05-27 DIAGNOSIS — G620 Drug-induced polyneuropathy: Secondary | ICD-10-CM

## 2016-05-27 DIAGNOSIS — C9002 Multiple myeloma in relapse: Secondary | ICD-10-CM

## 2016-05-27 DIAGNOSIS — Z5112 Encounter for antineoplastic immunotherapy: Secondary | ICD-10-CM

## 2016-05-27 DIAGNOSIS — G893 Neoplasm related pain (acute) (chronic): Secondary | ICD-10-CM | POA: Diagnosis not present

## 2016-05-27 DIAGNOSIS — J069 Acute upper respiratory infection, unspecified: Secondary | ICD-10-CM

## 2016-05-27 LAB — LACTATE DEHYDROGENASE: LDH: 254 U/L — AB (ref 125–245)

## 2016-05-27 LAB — CBC WITH DIFFERENTIAL/PLATELET
BASO%: 0.3 % (ref 0.0–2.0)
BASOS ABS: 0 10*3/uL (ref 0.0–0.1)
EOS%: 0.3 % (ref 0.0–7.0)
Eosinophils Absolute: 0 10*3/uL (ref 0.0–0.5)
HEMATOCRIT: 28.1 % — AB (ref 34.8–46.6)
HEMOGLOBIN: 8.8 g/dL — AB (ref 11.6–15.9)
LYMPH#: 0.6 10*3/uL — AB (ref 0.9–3.3)
LYMPH%: 15.6 % (ref 14.0–49.7)
MCH: 33.3 pg (ref 25.1–34.0)
MCHC: 31.3 g/dL — ABNORMAL LOW (ref 31.5–36.0)
MCV: 106.4 fL — ABNORMAL HIGH (ref 79.5–101.0)
MONO#: 0.3 10*3/uL (ref 0.1–0.9)
MONO%: 7.9 % (ref 0.0–14.0)
NEUT#: 3 10*3/uL (ref 1.5–6.5)
NEUT%: 75.9 % (ref 38.4–76.8)
Platelets: 116 10*3/uL — ABNORMAL LOW (ref 145–400)
RBC: 2.64 10*6/uL — ABNORMAL LOW (ref 3.70–5.45)
RDW: 17.3 % — AB (ref 11.2–14.5)
WBC: 3.9 10*3/uL (ref 3.9–10.3)

## 2016-05-27 LAB — COMPREHENSIVE METABOLIC PANEL
ALBUMIN: 2.9 g/dL — AB (ref 3.5–5.0)
ALK PHOS: 127 U/L (ref 40–150)
ALT: 19 U/L (ref 0–55)
AST: 14 U/L (ref 5–34)
Anion Gap: 9 mEq/L (ref 3–11)
BILIRUBIN TOTAL: 1.21 mg/dL — AB (ref 0.20–1.20)
BUN: 7.3 mg/dL (ref 7.0–26.0)
CALCIUM: 9.1 mg/dL (ref 8.4–10.4)
CO2: 23 mEq/L (ref 22–29)
CREATININE: 0.9 mg/dL (ref 0.6–1.1)
Chloride: 106 mEq/L (ref 98–109)
EGFR: 82 mL/min/{1.73_m2} — ABNORMAL LOW (ref >90)
Glucose: 123 mg/dl (ref 70–140)
POTASSIUM: 3.8 meq/L (ref 3.5–5.1)
Sodium: 138 mEq/L (ref 136–145)
Total Protein: 6.9 g/dL (ref 6.4–8.3)

## 2016-05-27 LAB — CULTURE, GROUP A STREP (THRC)

## 2016-05-27 MED ORDER — DEXAMETHASONE SODIUM PHOSPHATE 10 MG/ML IJ SOLN
INTRAMUSCULAR | Status: AC
  Filled 2016-05-27: qty 1

## 2016-05-27 MED ORDER — DEXAMETHASONE SODIUM PHOSPHATE 10 MG/ML IJ SOLN
10.0000 mg | Freq: Once | INTRAMUSCULAR | Status: AC
  Administered 2016-05-27: 10 mg via INTRAVENOUS

## 2016-05-27 MED ORDER — PALONOSETRON HCL INJECTION 0.25 MG/5ML
INTRAVENOUS | Status: AC
  Filled 2016-05-27: qty 5

## 2016-05-27 MED ORDER — SODIUM CHLORIDE 0.9 % IV SOLN
Freq: Once | INTRAVENOUS | Status: AC
  Administered 2016-05-27: 12:00:00 via INTRAVENOUS

## 2016-05-27 MED ORDER — PALONOSETRON HCL INJECTION 0.25 MG/5ML
0.2500 mg | Freq: Once | INTRAVENOUS | Status: AC
  Administered 2016-05-27: 0.25 mg via INTRAVENOUS

## 2016-05-27 MED ORDER — SODIUM CHLORIDE 0.9 % IV SOLN
300.0000 mg/m2 | Freq: Once | INTRAVENOUS | Status: AC
  Administered 2016-05-27: 620 mg via INTRAVENOUS
  Filled 2016-05-27: qty 31

## 2016-05-27 MED ORDER — HEPARIN SOD (PORK) LOCK FLUSH 100 UNIT/ML IV SOLN
500.0000 [IU] | Freq: Once | INTRAVENOUS | Status: AC | PRN
  Administered 2016-05-27: 500 [IU]
  Filled 2016-05-27: qty 5

## 2016-05-27 MED ORDER — DEXTROSE 5 % IV SOLN
27.0000 mg/m2 | Freq: Once | INTRAVENOUS | Status: AC
  Administered 2016-05-27: 56 mg via INTRAVENOUS
  Filled 2016-05-27: qty 28

## 2016-05-27 MED ORDER — SODIUM CHLORIDE 0.9% FLUSH
10.0000 mL | INTRAVENOUS | Status: DC | PRN
  Administered 2016-05-27: 10 mL
  Filled 2016-05-27: qty 10

## 2016-05-27 MED ORDER — HYDROCODONE-ACETAMINOPHEN 7.5-325 MG PO TABS: 1.0000 | ORAL_TABLET | Freq: Four times a day (QID) | ORAL | 0 refills | Status: DC | PRN

## 2016-05-27 NOTE — Patient Instructions (Signed)
[facility] Discharge Instructions for Patients Receiving Chemotherapy  Today you received the following chemotherapy agents:  Kyprolis and Cytoxan.  To help prevent nausea and vomiting after your treatment, we encourage you to take your nausea medication as directed.   If you develop nausea and vomiting that is not controlled by your nausea medication, call the clinic.   BELOW ARE SYMPTOMS THAT SHOULD BE REPORTED IMMEDIATELY:  *FEVER GREATER THAN 100.5 F  *CHILLS WITH OR WITHOUT FEVER  NAUSEA AND VOMITING THAT IS NOT CONTROLLED WITH YOUR NAUSEA MEDICATION  *UNUSUAL SHORTNESS OF BREATH  *UNUSUAL BRUISING OR BLEEDING  TENDERNESS IN MOUTH AND THROAT WITH OR WITHOUT PRESENCE OF ULCERS  *URINARY PROBLEMS  *BOWEL PROBLEMS  UNUSUAL RASH Items with * indicate a potential emergency and should be followed up as soon as possible.  Feel free to call the clinic you have any questions or concerns. The clinic phone number is [phone].  Please show the CHEMO ALERT CARD at check-in to the Emergency Department and triage nurse.   

## 2016-05-27 NOTE — Progress Notes (Signed)
[place] Telephone:([phone]) [phone]   Fax:([phone]) [place], [place] [address]  DIAGNOSIS: Recurrent multiple myeloma initially diagnosed in October 2008.   PRIOR THERAPY:  1. Status post palliative radiotherapy to the lumbar spine between L3 and L5. The patient received a total dose of 3000 cGy in 10 fractions under the care of Dr. Lisbeth Renshaw between May 05, 2007 through May 18, 2007. 2. Status post 5 cycles of systemic chemotherapy with Revlimid and low-dose Decadron with good response to this treatment. 3. Status post autologous peripheral blood stem cell transplant at [facility] on Nov 20, 2007 under the care of Dr. Valarie Merino. 4. Status post treatment for disease recurrence with Velcade, Doxil and Decadron. Last dose given Nov 09, 2009. Discontinued secondary to intolerance but the patient had a good response to treatment at that time. 5. Status post palliative radiotherapy to the T2-T6 thoracic vertebrae completed 03/21/2011 under the care of Dr. Lisbeth Renshaw. 6. Systemic chemotherapy with Velcade 1.3 mg per meter squared given on days 1, 4, 8 and 11, and Doxil at 30 mg per meter squared given on day 4 in addition to Decadron status post 4 cycles, discontinued secondary to intolerance. 7. Systemic therapy with Velcade 1.3 mg/M2 subcutaneously in addition to Decadron 40 mg by mouth on a weekly basis, status post 20 cycles. The patient had good response with this treatment but it is discontinue today secondary to worsening peripheral neuropathy. 8. Palliative radiotherapy to the skull lesion as well as the left hip area under the care of Dr. Lisbeth Renshaw. 9. Systemic chemotherapy with Carfilzomib 20 mg/M2 on days 1, 2,  8, 9, 13 and 16 every 4 weeks in addition to weekly Decadron 40 mg by mouth. First dose on 04/19/2013. Status post 4 cycles, discontinued recently secondary to cardiac  dysfunction. 10. Pomalyst 3 mg by mouth daily for 21 days every 4 weeks in addition to dexamethasone 40 mg on a weekly basis. First dose started 12/02/2013. Status post 28 cycles. Her dose of Pomalyst will be reduced to 2 mg by mouth daily for 21 days every 4 weeks starting from cycle 23, discontinued secondary to disease progression.    CURRENT THERAPY:  1. Systemic chemotherapy with Carfilzomib 27 MG/M2, Cytoxan 300 MG/M2 on days 1, 2, 8, 9, 15 and 16 every 4 weeks in addition to weekly Decadron 20 mg by mouth. First cycle 03/25/2016. Status post 2 cycles. 2. Zometa 4 mg IV given every 3 months.   INTERVAL HISTORY: [name] 65 y.o. female returns to the clinic today for follow up visit accompanied by her daughter. The patient was started recently on treatment with Carfilzomib, Cytoxan and Decadron status post 2 cycles and tolerated her treatment fairly well. She feels much better after starting this new regimen. She was seen at the emergency department this weekend with sore throat and cough as well as mild hemoptysis. Her strep test was negative. She denied having any significant chest pain, but continues to have mild shortness breath with cough and no hemoptysis. She has no current nausea, vomiting, diarrhea or constipation. She has persistent low back pain and she is currently on pain medication with MS Contin and Norco. She denied having any significant weight loss or night sweats. She had repeat myeloma panel performed recently and she is here for evaluation and discussion of her lab results before starting cycle #3.  MEDICAL HISTORY: Past Medical  History:  Diagnosis Date  . Compression fracture 04/08/2007   pathologic compression fracture  . Constipation    takes oxycontin,vicodin  . Family history of anesthesia complication    "daughter gets PONV too"  . FHx: chemotherapy    s/p 5 cycle revlimid/low dose decadron,s/p velcade,doxil,decadron,  . GERD (gastroesophageal reflux  disease)   . History of autologous stem cell transplant ([place]) 11/20/2007   [facility], Dr Valarie Merino  . Hx of radiation therapy 05/05/07-05/18/07,& 03/05/11-03/21/11-   l3&l5 in 2008, t2-t6 03/2011  . Hx of radiation therapy 05/05/2007 to 05/18/2007   palliative, L3-5  . Hx of radiation therapy 03/05/2011 to 03/21/2011   palliative T2-T6, c-spine  . Hypercholesterolemia   . Hypothyroidism   . Insomnia    associated with steroids  . Metastasis to bone ([place])   . Multiple myeloma ([place]) dx'd 2009  . Pneumonia    "several times"  . PONV (postoperative nausea and vomiting)   . Stroke [facility]) 2014   denies residual on 01/27/2014    ALLERGIES:  has No Known Allergies.  MEDICATIONS:  Current Outpatient Prescriptions  Medication Sig Dispense Refill  . albuterol (PROVENTIL HFA;VENTOLIN HFA) 108 (90 Base) MCG/ACT inhaler Inhale 2 puffs into the lungs every 2 (two) hours as needed for wheezing or shortness of breath (cough). 1 Inhaler 0  . ALPRAZolam (XANAX) 0.5 MG tablet TAKE 1/2 TO 1 TABLET 2 TO 3 TIMES DAILY AS NEEDED FOR ANXIETY 90 tablet 3  . aspirin 81 MG tablet Take 81 mg by mouth as directed. Mondays, Wednesdays, and Fridays    . blood glucose meter kit and supplies Test blood sugars once a day 1 each 0  . brompheniramine-pseudoephedrine-DM 30-2-10 MG/5ML syrup Take 5 mLs by mouth 4 (four) times daily as needed. 473 mL 3  . citalopram (CELEXA) 40 MG tablet TAKE 1 TABLET (40 MG TOTAL) BY MOUTH DAILY. 90 tablet 2  . cyclobenzaprine (FLEXERIL) 10 MG tablet TAKE 1 TABLET 3 TIMES A DAY AS NEEDED FOR MUSCLE SPASMS 90 tablet 3  . dexamethasone (DECADRON) 4 MG tablet TAKE (10) TABLETS BY MOUTH EVERY FRIDAY 40 tablet 1  . diphenoxylate-atropine (LOMOTIL) 2.5-0.025 MG tablet TAKE 2 TABLETS BY MOUTH 4 TIMES A DAY AS NEEDED FOR DIARRHEA 30 tablet 1  . Eszopiclone 3 MG TABS TAKE 1 TABLET AT BEDTIME AS NEEDED SLEEP 30 tablet 2  . furosemide (LASIX) 40 MG tablet TAKE 1 TABLET BY MOUTH TWICE A DAY FOR FLUID AND SWELLING  180 tablet 1  . gabapentin (NEURONTIN) 600 MG tablet TAKE 1 TABLET BY MOUTH 4 TIMES A DAY AS NEEDED FOR PAIN OR CRAMPS (Patient taking differently: 1bid prn) 360 tablet 1  . HYDROcodone-acetaminophen (NORCO) 7.5-325 MG tablet Take 1 tablet by mouth every 6 (six) hours as needed for moderate pain. 45 tablet 0  . levofloxacin (LEVAQUIN) 750 MG tablet Take 1 tablet (750 mg total) by mouth daily. X 7 days (Patient not taking: Reported on 05/25/2016) 7 tablet 0  . loperamide (IMODIUM) 2 MG capsule Take 2 mg by mouth as needed for diarrhea or loose stools. Reported on 10/02/2015    . Magnesium 500 MG TABS Take 500 mg by mouth 2 (two) times daily.     . metolazone (ZAROXOLYN) 2.5 MG tablet PLEASE TAKE 1 TABLET ON WEDNESDAY AND SUNDAY IN THE MORNING FOR SWELLING. 30 tablet 1  . mometasone (NASONEX) 50 MCG/ACT nasal spray PLACE 2 SPRAYS INTO THE NOSE DAILY. (Patient taking differently: PLACE 2 SPRAYS INTO THE NOSE DAILY AS  NEEDED FOR ALLERGIES.) 51 g 1  . montelukast (SINGULAIR) 10 MG tablet TAKE 1 TABLET BY MOUTH EVERY DAY 90 tablet 1  . morphine (MS CONTIN) 15 MG 12 hr tablet Take 1 tablet (15 mg total) by mouth every 12 (twelve) hours. 60 tablet 0  . ondansetron (ZOFRAN-ODT) 8 MG disintegrating tablet TAKE 1 TABLET BY MOUTH EVERY 8 HOURS AS NEEDED FOR NAUSEA AND VOMITING 30 tablet 1  . ONE TOUCH ULTRA TEST test strip TEST BLOOD SUGARS ONCE DAILY 100 each 3  . pantoprazole (PROTONIX) 40 MG tablet TAKE 1 TABLET (40 MG TOTAL) BY MOUTH 2 (TWO) TIMES DAILY. (Patient taking differently: Take 40 mg by mouth daily. ) 90 tablet 1  . potassium chloride (KLOR-CON 10) 10 MEQ tablet Take 2 tablets by mouth 2 times daily. 360 tablet 1  . predniSONE (DELTASONE) 20 MG tablet 3 tabs po daily x 3 days, then 2 tabs x 3 days, then 1.5 tabs x 3 days, then 1 tab x 3 days, then 0.5 tabs x 3 days (Patient taking differently: Take 20 mg by mouth daily. 3 tabs po daily x 3 days, then 2 tabs x 3 days, then 1.5 tabs x 3 days, then 1 tab  x 3 days, then 0.5 tabs x 3 days) 27 tablet 0  . prochlorperazine (COMPAZINE) 10 MG tablet Take 1 tablet (10 mg total) by mouth every 6 (six) hours as needed for nausea or vomiting. 30 tablet 0  . SYNTHROID 100 MCG tablet TAKE 1 TABLET BY MOUTH DAILY 90 tablet 1  . traZODone (DESYREL) 150 MG tablet TAKE 1/2 TO 1 TABLET 1 HOUR BEFORE SLEEP. (Patient not taking: Reported on 05/25/2016) 90 tablet 3   Current Facility-Administered Medications  Medication Dose Route Frequency Provider Last Rate Last Dose  . ipratropium-albuterol (DUONEB) 0.5-2.5 (3) MG/3ML nebulizer solution 3 mL  3 mL Nebulization Once Starlyn Skeans, [name]        SURGICAL HISTORY:  Past Surgical History:  Procedure Laterality Date  . CHOLECYSTECTOMY  1980's  . KNEE ARTHROSCOPY Right    "put pin in"  . KNEE ARTHROSCOPY Right    "took pin out and corrected what was wrong"  . PORTACATH PLACEMENT Right 2009  . VAGINAL HYSTERECTOMY  1980's  . VIDEO BRONCHOSCOPY  07/30/2011   Procedure: VIDEO BRONCHOSCOPY WITHOUT FLUORO;  Surgeon: Kathee Delton, MD;  Location: [facility];  Service: Cardiopulmonary;  Laterality: Bilateral;    REVIEW OF SYSTEMS:  Constitutional: positive for fatigue Eyes: negative Ears, nose, mouth, throat, and face: negative Respiratory: negative Cardiovascular: negative Gastrointestinal: negative Genitourinary:negative Integument/breast: negative Hematologic/lymphatic: negative Musculoskeletal:positive for back pain Neurological: negative Behavioral/Psych: negative Endocrine: negative Allergic/Immunologic: negative   PHYSICAL EXAMINATION: General appearance: alert, cooperative, fatigued and no distress Head: Normocephalic, without obvious abnormality, atraumatic Neck: no adenopathy, no JVD, supple, symmetrical, trachea midline and thyroid not enlarged, symmetric, no tenderness/mass/nodules Lymph nodes: Cervical, supraclavicular, and axillary nodes normal. Resp: clear to auscultation  bilaterally Back: symmetric, no curvature. ROM normal. No CVA tenderness. Cardio: regular rate and rhythm, S1, S2 normal, no murmur, click, rub or gallop GI: soft, non-tender; bowel sounds normal; no masses,  no organomegaly Extremities: edema 1+ edema of left lower extremity. Neurologic: Alert and oriented X 3, normal strength and tone. Normal symmetric reflexes. Normal coordination and gait  ECOG PERFORMANCE STATUS: 1 - Symptomatic but completely ambulatory  Blood pressure 126/67, pulse (!) 104, temperature 98.6 F (37 C), temperature source Oral, resp. rate 19, weight 213 lb 4.8 oz (96.8 kg),  SpO2 98 %.  LABORATORY DATA: Lab Results  Component Value Date   WBC 2.0 (L) 05/20/2016   HGB 9.5 (L) 05/20/2016   HCT 29.3 (L) 05/20/2016   MCV 105.0 (H) 05/20/2016   PLT 64 (L) 05/20/2016      Chemistry      Component Value Date/Time   NA 139 05/20/2016 0845   K 4.2 05/20/2016 0845   CL 107 03/20/2016 1001   CL 105 12/17/2012 1015   CO2 23 05/20/2016 0845   BUN 5.8 (L) 05/20/2016 0845   CREATININE 0.8 05/20/2016 0845      Component Value Date/Time   CALCIUM 8.3 (L) 05/20/2016 0845   ALKPHOS 92 05/20/2016 0845   AST 15 05/20/2016 0845   ALT 29 05/20/2016 0845   BILITOT 1.26 (H) 05/20/2016 0845     Myeloma panel Performed 03/11/2016 showed beta 2 microglobulin 2.3, IgG 1305, IgA 33 and IgM <5. Free kappa light chain 6.7, free lambda light chain 129 and a kappa/lambda ratio 0.05  RADIOGRAPHIC STUDIES: No results found.  ASSESSMENT AND PLAN:  This is a very pleasant 65 years old [place] American female with recurrent multiple myeloma recently completed a course of treatment with Velcade and Decadron with improvement in her disease but this was discontinued secondary to peripheral neuropathy. The patient tolerated her treatment with Carfilzomib and Decadron fairly well except for the recent shortness breath and cough which was felt to be secondary to congestive heart failure from  her treatment with Carfilzomib. This treatment was discontinued. She was started on treatment with Pomalyst and Decadron status post 28 cycles. She tolerated the last cycle of her treatment well. I recommended for the patient to continue her treatment with Pomalyst at reduced dose of 2 mg.  Unfortunately the recent myeloma panel showed significant elevation of the free lambda light chain. She is currently undergoing treatment with Carfilzomib, Cytoxan and Decadron status post 2 cycles. She tolerated the first 2 cycles of her treatment fairly well with no significant adverse effects.  Her myeloma panel showed significant improvement in her disease. I discussed the lab result with the patient and her daughter today. I recommended for her to proceed with cycle #3 of her lab work is better today with no significant neutropenia or thrombocytopenia. She will come back for follow-up visit in 4 weeks for evaluation before starting cycle #4. For the back pain she will continue on MS Contin and Norco. I gave her refill of Norco. She was advised to call immediately if she has any concerning symptoms in the interval. The patient voices understanding of current disease status and treatment options and is in agreement with the current care plan.  All questions were answered. The patient knows to call the clinic with any problems, questions or concerns. We can certainly see the patient much sooner if necessary.  Disclaimer: This note was dictated with voice recognition software. Similar sounding words can inadvertently be transcribed and may be missed upon review.

## 2016-05-28 ENCOUNTER — Ambulatory Visit (HOSPITAL_BASED_OUTPATIENT_CLINIC_OR_DEPARTMENT_OTHER): Payer: Medicare Other

## 2016-05-28 ENCOUNTER — Encounter: Payer: Self-pay | Admitting: Nurse Practitioner

## 2016-05-28 VITALS — BP 126/70 | HR 99 | Temp 98.8°F | Resp 18

## 2016-05-28 DIAGNOSIS — Z5112 Encounter for antineoplastic immunotherapy: Secondary | ICD-10-CM | POA: Diagnosis not present

## 2016-05-28 DIAGNOSIS — C9002 Multiple myeloma in relapse: Secondary | ICD-10-CM | POA: Diagnosis not present

## 2016-05-28 MED ORDER — SODIUM CHLORIDE 0.9% FLUSH
10.0000 mL | INTRAVENOUS | Status: DC | PRN
  Administered 2016-05-28: 10 mL
  Filled 2016-05-28: qty 10

## 2016-05-28 MED ORDER — DEXAMETHASONE SODIUM PHOSPHATE 10 MG/ML IJ SOLN
INTRAMUSCULAR | Status: AC
  Filled 2016-05-28: qty 1

## 2016-05-28 MED ORDER — HEPARIN SOD (PORK) LOCK FLUSH 100 UNIT/ML IV SOLN
500.0000 [IU] | Freq: Once | INTRAVENOUS | Status: AC | PRN
  Administered 2016-05-28: 500 [IU]
  Filled 2016-05-28: qty 5

## 2016-05-28 MED ORDER — SODIUM CHLORIDE 0.9 % IV SOLN
Freq: Once | INTRAVENOUS | Status: AC
  Administered 2016-05-28: 09:00:00 via INTRAVENOUS

## 2016-05-28 MED ORDER — DEXAMETHASONE SODIUM PHOSPHATE 10 MG/ML IJ SOLN
10.0000 mg | Freq: Once | INTRAMUSCULAR | Status: AC
  Administered 2016-05-28: 10 mg via INTRAVENOUS

## 2016-05-28 MED ORDER — DEXTROSE 5 % IV SOLN
27.0000 mg/m2 | Freq: Once | INTRAVENOUS | Status: AC
  Administered 2016-05-28: 56 mg via INTRAVENOUS
  Filled 2016-05-28: qty 28

## 2016-05-28 NOTE — Assessment & Plan Note (Signed)
Patient states that she began having a sore throat on things.  Given the last week; and coughed up one episode of bright red hemoptysis.  She states that this scared her; and she presented to the emergency department on Friday, 05/24/2016 for further evaluation.  At that time she was fully evaluated and a strep test was negative.  She was informed that this was most likely an upper respiratory infection; and advised to try over-the-counter medications.  Patient states that she now feels some better from her cold; but continues with some laryngitis.  She denies any recent fevers or chills.

## 2016-05-28 NOTE — Progress Notes (Signed)
Give hydration fluid of Normal Saline at 500 ml for 250 ml, per Dr. Julien Nordmann.

## 2016-05-28 NOTE — Patient Instructions (Signed)
[facility] Discharge Instructions for Patients Receiving Chemotherapy  Today you received the following chemotherapy agents: Kyprolis   To help prevent nausea and vomiting after your treatment, we encourage you to take your nausea medication as directed.    If you develop nausea and vomiting that is not controlled by your nausea medication, call the clinic.   BELOW ARE SYMPTOMS THAT SHOULD BE REPORTED IMMEDIATELY:  *FEVER GREATER THAN 100.5 F  *CHILLS WITH OR WITHOUT FEVER  NAUSEA AND VOMITING THAT IS NOT CONTROLLED WITH YOUR NAUSEA MEDICATION  *UNUSUAL SHORTNESS OF BREATH  *UNUSUAL BRUISING OR BLEEDING  TENDERNESS IN MOUTH AND THROAT WITH OR WITHOUT PRESENCE OF ULCERS  *URINARY PROBLEMS  *BOWEL PROBLEMS  UNUSUAL RASH Items with * indicate a potential emergency and should be followed up as soon as possible.  Feel free to call the clinic you have any questions or concerns. The clinic phone number is [phone].  Please show the CHEMO ALERT CARD at check-in to the Emergency Department and triage nurse.   

## 2016-05-28 NOTE — Assessment & Plan Note (Addendum)
Patient states that she becomes dizzy when she changes positions quickly and has fallen several times.  She states that she has a cane at home; but typically never uses it.  Brief review of all of patient's medication; and she is on no blood pressure medications for this provider.  However, patient does take Lasix 40 mg on a daily basis for chronic edema to her lower extremities.  Patient was advised that the Lasix could be causing some hypotension orthostatically.  She was advised to change positions slowly and to use her cane to help prevent any falls.  It is also noted that patient's hemoglobin down to 8.8.  Reviewed all findings with Dr. Julien Nordmann; and he stated that we will continue to monitor patient's hemoglobin carefully.

## 2016-05-28 NOTE — Progress Notes (Signed)
SYMPTOM MANAGEMENT CLINIC    Chief Complaint: URI, dizziness  HPI:  [name] 65 y.o. female diagnosed with multiple myeloma.  Currently undergoing Cytoxan/carfilzomib treatments.    No history exists.    Review of Systems  Constitutional: Positive for malaise/fatigue.  HENT: Positive for congestion and sore throat.   Neurological: Positive for dizziness.  All other systems reviewed and are negative.   Past Medical History:  Diagnosis Date  . Compression fracture 04/08/2007   pathologic compression fracture  . Constipation    takes oxycontin,vicodin  . Family history of anesthesia complication    "daughter gets PONV too"  . FHx: chemotherapy    s/p 5 cycle revlimid/low dose decadron,s/p velcade,doxil,decadron,  . GERD (gastroesophageal reflux disease)   . 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 Hx of radiation therapy 05/05/07-05/18/07,& 03/05/11-03/21/11-   l3&l5 in 2008, t2-t6 03/2011  . Hx of radiation therapy 05/05/2007 to 05/18/2007   palliative, L3-5  . Hx of radiation therapy 03/05/2011 to 03/21/2011   palliative T2-T6, c-spine  . Hypercholesterolemia   . Hypothyroidism   . Insomnia    associated with steroids  . Metastasis to bone ([place])   . Multiple myeloma ([place]) dx'd 2009  . Pneumonia    "several times"  . PONV (postoperative nausea and vomiting)   . Stroke [facility]) 2014   denies residual on 01/27/2014    Past Surgical History:  Procedure Laterality Date  . CHOLECYSTECTOMY  1980's  . KNEE ARTHROSCOPY Right    "put pin in"  . KNEE ARTHROSCOPY Right    "took pin out and corrected what was wrong"  . PORTACATH PLACEMENT Right 2009  . VAGINAL HYSTERECTOMY  1980's  . VIDEO BRONCHOSCOPY  07/30/2011   Procedure: VIDEO BRONCHOSCOPY WITHOUT FLUORO;  Surgeon: Kathee Delton, MD;  Location: [facility];  Service: Cardiopulmonary;  Laterality: Bilateral;    has Multiple myeloma in relapse ([place]); Hypothyroidism; GERD;  Essential hypertension; History of autologous stem cell transplant ([place]); Hyperlipidemia; Medication management; Vitamin D deficiency; CKD stage 3 due to type 2 diabetes mellitus ([place]); Pain and swelling of left lower extremity; Encounter for antineoplastic chemotherapy; Lytic bone lesions on xray; Dehydration; Peripheral edema; Nausea with vomiting; Bronchitis; Hypomagnesemia; Diarrhea; Dizziness; HCAP (healthcare-associated pneumonia); Weakness generalized; Edema; Cancer associated pain; and Upper respiratory infection on her problem list.    has No Known Allergies.    Medication List       Accurate as of 05/27/16 11:59 PM. Always use your most recent med list.          albuterol 108 (90 Base) MCG/ACT inhaler Commonly known as:  PROVENTIL HFA;VENTOLIN HFA Inhale 2 puffs into the lungs every 2 (two) hours as needed for wheezing or shortness of breath (cough).   ALPRAZolam 0.5 MG tablet Commonly known as:  XANAX TAKE 1/2 TO 1 TABLET 2 TO 3 TIMES DAILY AS NEEDED FOR ANXIETY   aspirin 81 MG tablet Take 81 mg by mouth as directed. Mondays, Wednesdays, and Fridays   blood glucose meter kit and supplies Test blood sugars once a day   brompheniramine-pseudoephedrine-DM 30-2-10 MG/5ML syrup Take 5 mLs by mouth 4 (four) times daily as needed.   citalopram 40 MG tablet Commonly known as:  CELEXA TAKE 1 TABLET (40 MG TOTAL) BY MOUTH DAILY.   cyclobenzaprine 10 MG tablet Commonly known as:  FLEXERIL TAKE 1 TABLET 3 TIMES A DAY AS NEEDED FOR MUSCLE SPASMS   dexamethasone 4 MG  tablet Commonly known as:  DECADRON TAKE (10) TABLETS BY MOUTH EVERY FRIDAY   diphenoxylate-atropine 2.5-0.025 MG tablet Commonly known as:  LOMOTIL TAKE 2 TABLETS BY MOUTH 4 TIMES A DAY AS NEEDED FOR DIARRHEA   Eszopiclone 3 MG Tabs TAKE 1 TABLET AT BEDTIME AS NEEDED SLEEP   furosemide 40 MG tablet Commonly known as:  LASIX TAKE 1 TABLET BY MOUTH TWICE A DAY FOR FLUID AND SWELLING   gabapentin 600 MG  tablet Commonly known as:  NEURONTIN TAKE 1 TABLET BY MOUTH 4 TIMES A DAY AS NEEDED FOR PAIN OR CRAMPS   HYDROcodone-acetaminophen 7.5-325 MG tablet Commonly known as:  NORCO Take 1 tablet by mouth every 6 (six) hours as needed for moderate pain.   levofloxacin 750 MG tablet Commonly known as:  LEVAQUIN Take 1 tablet (750 mg total) by mouth daily. X 7 days   loperamide 2 MG capsule Commonly known as:  IMODIUM Take 2 mg by mouth as needed for diarrhea or loose stools. Reported on 10/02/2015   Magnesium 500 MG Tabs Take 500 mg by mouth 2 (two) times daily.   metolazone 2.5 MG tablet Commonly known as:  ZAROXOLYN PLEASE TAKE 1 TABLET ON WEDNESDAY AND SUNDAY IN THE MORNING FOR SWELLING.   mometasone 50 MCG/ACT nasal spray Commonly known as:  NASONEX PLACE 2 SPRAYS INTO THE NOSE DAILY.   montelukast 10 MG tablet Commonly known as:  SINGULAIR TAKE 1 TABLET BY MOUTH EVERY DAY   morphine 15 MG 12 hr tablet Commonly known as:  MS CONTIN Take 1 tablet (15 mg total) by mouth every 12 (twelve) hours.   ondansetron 8 MG disintegrating tablet Commonly known as:  ZOFRAN-ODT TAKE 1 TABLET BY MOUTH EVERY 8 HOURS AS NEEDED FOR NAUSEA AND VOMITING   ONE TOUCH ULTRA TEST test strip Generic drug:  glucose blood TEST BLOOD SUGARS ONCE DAILY   pantoprazole 40 MG tablet Commonly known as:  PROTONIX TAKE 1 TABLET (40 MG TOTAL) BY MOUTH 2 (TWO) TIMES DAILY.   potassium chloride 10 MEQ tablet Commonly known as:  KLOR-CON 10 Take 2 tablets by mouth 2 times daily.   predniSONE 20 MG tablet Commonly known as:  DELTASONE 3 tabs po daily x 3 days, then 2 tabs x 3 days, then 1.5 tabs x 3 days, then 1 tab x 3 days, then 0.5 tabs x 3 days   prochlorperazine 10 MG tablet Commonly known as:  COMPAZINE Take 1 tablet (10 mg total) by mouth every 6 (six) hours as needed for nausea or vomiting.   SYNTHROID 100 MCG tablet Generic drug:  levothyroxine TAKE 1 TABLET BY MOUTH DAILY   traZODone 150 MG  tablet Commonly known as:  DESYREL TAKE 1/2 TO 1 TABLET 1 HOUR BEFORE SLEEP.        PHYSICAL EXAMINATION  Oncology Vitals 05/28/2016 05/27/2016  Height - -  Weight - -  Weight (lbs) - -  BMI (kg/m2) - -  Temp 98.8 -  Pulse 99 96  Resp 18 -  Resp (Historical as of 01/30/12) - -  SpO2 100 -  BSA (m2) - -   BP Readings from Last 2 Encounters:  05/28/16 126/70  05/27/16 126/67    Physical Exam  Constitutional: She is oriented to person, place, and time and well-developed, well-nourished, and in no distress.  HENT:  Head: Normocephalic and atraumatic.  Mouth/Throat: Oropharynx is clear and moist.  Eyes: Conjunctivae and EOM are normal. Pupils are equal, round, and reactive to light.  Neck: Normal range of motion.  Pulmonary/Chest: Effort normal. No respiratory distress.  Mild laryngitis  Musculoskeletal: Normal range of motion.  Neurological: She is alert and oriented to person, place, and time.  Skin: Skin is warm and dry.  Psychiatric: Affect normal.  Nursing note and vitals reviewed.   LABORATORY DATA:. Orders Only on 05/27/2016  Component Date Value Ref Range Status  . WBC 05/27/2016 3.9  3.9 - 10.3 10e3/uL Final  . NEUT# 05/27/2016 3.0  1.5 - 6.5 10e3/uL Final  . HGB 05/27/2016 8.8* 11.6 - 15.9 g/dL Final  . HCT 05/27/2016 28.1* 34.8 - 46.6 % Final  . Platelets 05/27/2016 116* 145 - 400 10e3/uL Final  . MCV 05/27/2016 106.4* 79.5 - 101.0 fL Final  . MCH 05/27/2016 33.3  25.1 - 34.0 pg Final  . MCHC 05/27/2016 31.3* 31.5 - 36.0 g/dL Final  . RBC 05/27/2016 2.64* 3.70 - 5.45 10e6/uL Final  . RDW 05/27/2016 17.3* 11.2 - 14.5 % Final  . lymph# 05/27/2016 0.6* 0.9 - 3.3 10e3/uL Final  . MONO# 05/27/2016 0.3  0.1 - 0.9 10e3/uL Final  . Eosinophils Absolute 05/27/2016 0.0  0.0 - 0.5 10e3/uL Final  . Basophils Absolute 05/27/2016 0.0  0.0 - 0.1 10e3/uL Final  . NEUT% 05/27/2016 75.9  38.4 - 76.8 % Final  . LYMPH% 05/27/2016 15.6  14.0 - 49.7 % Final  . MONO%  05/27/2016 7.9  0.0 - 14.0 % Final  . EOS% 05/27/2016 0.3  0.0 - 7.0 % Final  . BASO% 05/27/2016 0.3  0.0 - 2.0 % Final  . Sodium 05/27/2016 138  136 - 145 mEq/L Final  . Potassium 05/27/2016 3.8  3.5 - 5.1 mEq/L Final  . Chloride 05/27/2016 106  98 - 109 mEq/L Final  . CO2 05/27/2016 23  22 - 29 mEq/L Final  . Glucose 05/27/2016 123  70 - 140 mg/dl Final  . BUN 05/27/2016 7.3  7.0 - 26.0 mg/dL Final  . Creatinine 05/27/2016 0.9  0.6 - 1.1 mg/dL Final  . Total Bilirubin 05/27/2016 1.21* 0.20 - 1.20 mg/dL Final  . Alkaline Phosphatase 05/27/2016 127  40 - 150 U/L Final  . AST 05/27/2016 14  5 - 34 U/L Final  . ALT 05/27/2016 19  0 - 55 U/L Final  . Total Protein 05/27/2016 6.9  6.4 - 8.3 g/dL Final  . Albumin 05/27/2016 2.9* 3.5 - 5.0 g/dL Final  . Calcium 05/27/2016 9.1  8.4 - 10.4 mg/dL Final  . Anion Gap 05/27/2016 9  3 - 11 mEq/L Final  . EGFR 05/27/2016 82* >90 ml/min/1.73 m2 Final  . LDH 05/27/2016 254* 125 - 245 U/L Final  Admission on 05/25/2016, Discharged on 05/25/2016  Component Date Value Ref Range Status  . Streptococcus, Group A Screen (Dir* 05/25/2016 NEGATIVE  NEGATIVE Final   Comment: (NOTE) A Rapid Antigen test may result negative if the antigen level in the sample is below the detection level of this test. The FDA has not cleared this test as a stand-alone test therefore the rapid antigen negative result has reflexed to a Group A Strep culture.   Marland Kitchen Specimen Description 05/27/2016 THROAT   Final  . Special Requests 05/27/2016 NONE Reflexed from [id number]   Final  . Culture 05/27/2016    Final                   Value:NO GROUP A STREP (S.PYOGENES) ISOLATED Performed at [facility]   . Report Status 05/27/2016 05/27/2016 FINAL  Final    RADIOGRAPHIC STUDIES: No results found.  ASSESSMENT/PLAN:    Upper respiratory infection Patient states that she began having a sore throat on things.  Given the last week; and coughed up one episode of bright red  hemoptysis.  She states that this scared her; and she presented to the emergency department on Friday, 05/24/2016 for further evaluation.  At that time she was fully evaluated and a strep test was negative.  She was informed that this was most likely an upper respiratory infection; and advised to try over-the-counter medications.  Patient states that she now feels some better from her cold; but continues with some laryngitis.  She denies any recent fevers or chills.  Multiple myeloma in relapse [facility]) Patient presented to the [facility] today to receive cycle 3, day 1 of her Cytoxan/carfilzomib treatment.  She is scheduled to return tomorrow for day 2 of her chemotherapy.  She is scheduled for labs only in 05/29/2016.  She is scheduled for labs and chemotherapy again on 06/03/2016.  Dizziness Patient states that she becomes dizzy when she changes positions quickly and has fallen several times.  She states that she has a cane at home; but typically never uses it.  Brief review of all of patient's medication; and she is on no blood pressure medications for this provider.  However, patient does take Lasix 40 mg on a daily basis for chronic edema to her lower extremities.  Patient was advised that the Lasix could be causing some hypotension orthostatically.  She was advised to change positions slowly and to use her cane to help prevent any falls.  It is also noted that patient's hemoglobin down to 8.8.  Reviewed all findings with Dr. Julien Nordmann; and he stated that we will continue to monitor patient's hemoglobin carefully.   Patient stated understanding of all instructions; and was in agreement with this plan of care. The patient knows to call the clinic with any problems, questions or concerns.   Total time spent with patient was 15 minutes;  with greater than 75 percent of that time spent in face to face counseling regarding patient's symptoms,  and coordination of care and follow  up.  Disclaimer:This dictation was prepared with Dragon/digital dictation along with [employer]. Any transcriptional errors that result from this process are unintentional.  Drue Second, NP 05/28/2016

## 2016-05-28 NOTE — Assessment & Plan Note (Signed)
Patient presented to the [facility] today to receive cycle 3, day 1 of her Cytoxan/carfilzomib treatment.  She is scheduled to return tomorrow for day 2 of her chemotherapy.  She is scheduled for labs only in 05/29/2016.  She is scheduled for labs and chemotherapy again on 06/03/2016.

## 2016-05-29 ENCOUNTER — Other Ambulatory Visit (INDEPENDENT_AMBULATORY_CARE_PROVIDER_SITE_OTHER): Payer: Medicare Other

## 2016-05-29 DIAGNOSIS — E039 Hypothyroidism, unspecified: Secondary | ICD-10-CM

## 2016-05-29 LAB — TSH: TSH: 0.44 m[IU]/L

## 2016-06-03 ENCOUNTER — Telehealth: Payer: Self-pay | Admitting: *Deleted

## 2016-06-03 ENCOUNTER — Ambulatory Visit (HOSPITAL_BASED_OUTPATIENT_CLINIC_OR_DEPARTMENT_OTHER): Payer: Medicare Other | Admitting: Nurse Practitioner

## 2016-06-03 ENCOUNTER — Other Ambulatory Visit: Payer: Self-pay | Admitting: Nurse Practitioner

## 2016-06-03 ENCOUNTER — Other Ambulatory Visit (HOSPITAL_BASED_OUTPATIENT_CLINIC_OR_DEPARTMENT_OTHER): Payer: Medicare Other

## 2016-06-03 ENCOUNTER — Ambulatory Visit (HOSPITAL_BASED_OUTPATIENT_CLINIC_OR_DEPARTMENT_OTHER): Payer: Medicare Other

## 2016-06-03 ENCOUNTER — Ambulatory Visit (HOSPITAL_COMMUNITY)
Admission: RE | Admit: 2016-06-03 | Discharge: 2016-06-03 | Disposition: A | Discharge status: Home / Self Care | Payer: Medicare Other | Source: Ambulatory Visit | Attending: Nurse Practitioner | Admitting: Nurse Practitioner

## 2016-06-03 VITALS — BP 122/75 | HR 105 | Temp 99.1°F | Resp 18

## 2016-06-03 DIAGNOSIS — C9002 Multiple myeloma in relapse: Secondary | ICD-10-CM

## 2016-06-03 DIAGNOSIS — R0602 Shortness of breath: Secondary | ICD-10-CM | POA: Diagnosis not present

## 2016-06-03 DIAGNOSIS — Z95828 Presence of other vascular implants and grafts: Secondary | ICD-10-CM

## 2016-06-03 DIAGNOSIS — Z452 Encounter for adjustment and management of vascular access device: Secondary | ICD-10-CM

## 2016-06-03 DIAGNOSIS — J40 Bronchitis, not specified as acute or chronic: Secondary | ICD-10-CM | POA: Diagnosis not present

## 2016-06-03 DIAGNOSIS — R05 Cough: Secondary | ICD-10-CM | POA: Diagnosis not present

## 2016-06-03 LAB — COMPREHENSIVE METABOLIC PANEL
ALBUMIN: 2.9 g/dL — AB (ref 3.5–5.0)
ALK PHOS: 96 U/L (ref 40–150)
ALT: 11 U/L (ref 0–55)
AST: 11 U/L (ref 5–34)
Anion Gap: 9 mEq/L (ref 3–11)
BILIRUBIN TOTAL: 0.73 mg/dL (ref 0.20–1.20)
BUN: 12.3 mg/dL (ref 7.0–26.0)
CO2: 22 meq/L (ref 22–29)
CREATININE: 1 mg/dL (ref 0.6–1.1)
Calcium: 9.8 mg/dL (ref 8.4–10.4)
Chloride: 107 mEq/L (ref 98–109)
EGFR: 72 mL/min/{1.73_m2} — AB (ref >90)
GLUCOSE: 123 mg/dL (ref 70–140)
Potassium: 3.9 mEq/L (ref 3.5–5.1)
SODIUM: 139 meq/L (ref 136–145)
TOTAL PROTEIN: 6.9 g/dL (ref 6.4–8.3)

## 2016-06-03 LAB — CBC WITH DIFFERENTIAL/PLATELET
BASO%: 0.3 % (ref 0.0–2.0)
Basophils Absolute: 0 10*3/uL (ref 0.0–0.1)
EOS ABS: 0 10*3/uL (ref 0.0–0.5)
EOS%: 0.8 % (ref 0.0–7.0)
HCT: 30.4 % — ABNORMAL LOW (ref 34.8–46.6)
HEMOGLOBIN: 9.6 g/dL — AB (ref 11.6–15.9)
LYMPH%: 25.1 % (ref 14.0–49.7)
MCH: 34.1 pg — ABNORMAL HIGH (ref 25.1–34.0)
MCHC: 31.7 g/dL (ref 31.5–36.0)
MCV: 107.5 fL — ABNORMAL HIGH (ref 79.5–101.0)
MONO#: 0.5 10*3/uL (ref 0.1–0.9)
MONO%: 13.1 % (ref 0.0–14.0)
NEUT%: 60.7 % (ref 38.4–76.8)
NEUTROS ABS: 2.3 10*3/uL (ref 1.5–6.5)
Platelets: 102 10*3/uL — ABNORMAL LOW (ref 145–400)
RBC: 2.83 10*6/uL — AB (ref 3.70–5.45)
RDW: 17.7 % — AB (ref 11.2–14.5)
WBC: 3.8 10*3/uL — AB (ref 3.9–10.3)
lymph#: 1 10*3/uL (ref 0.9–3.3)

## 2016-06-03 MED ORDER — ALBUTEROL SULFATE HFA 108 (90 BASE) MCG/ACT IN AERS: 1.0000 | INHALATION_SPRAY | RESPIRATORY_TRACT | 1 refills | Status: AC | PRN

## 2016-06-03 MED ORDER — LEVOFLOXACIN 500 MG PO TABS
500.0000 mg | ORAL_TABLET | Freq: Every day | ORAL | 0 refills | Status: DC
Start: 2016-06-03 — End: 2016-06-20

## 2016-06-03 MED ORDER — HEPARIN SOD (PORK) LOCK FLUSH 100 UNIT/ML IV SOLN
500.0000 [IU] | Freq: Once | INTRAVENOUS | Status: AC | PRN
  Administered 2016-06-03: 500 [IU] via INTRAVENOUS
  Filled 2016-06-03: qty 5

## 2016-06-03 MED ORDER — SODIUM CHLORIDE 0.9 % IJ SOLN
10.0000 mL | INTRAMUSCULAR | Status: DC | PRN
  Administered 2016-06-03: 10 mL via INTRAVENOUS
  Filled 2016-06-03: qty 10

## 2016-06-03 NOTE — Progress Notes (Signed)
Pt presented with low grade temp of 99.1.  Pt reports temp as high as 101 at home and coughing up green mucous.  Symptoms reported to Selena Lesser, NP.  Pt given option of holding chemo this week and pt agreed.  Pt sent to radiology for CXR.  Scripts called to patient's pharmacy per [place].  Pt scheduled to return on Dec 11th.

## 2016-06-03 NOTE — Patient Instructions (Signed)
alAlbuterol inhalation aerosol What is this medicine? ALBUTEROL (al Normajean [place]) is a bronchodilator. It helps open up the airways in your lungs to make it easier to breathe. This medicine is used to treat and to prevent bronchospasm. This medicine may be used for other purposes; ask your health care provider or pharmacist if you have questions. COMMON BRAND NAME(S): Proair HFA, Proventil, Proventil HFA, Respirol, Ventolin, Ventolin HFA What should I tell my health care provider before I take this medicine? They need to know if you have any of the following conditions: -diabetes -heart disease or irregular heartbeat -high blood pressure -pheochromocytoma -seizures -thyroid disease -an unusual or allergic reaction to albuterol, levalbuterol, sulfites, other medicines, foods, dyes, or preservatives -pregnant or trying to get pregnant -breast-feeding How should I use this medicine? This medicine is for inhalation through the mouth. Follow the directions on your prescription label. Take your medicine at regular intervals. Do not use more often than directed. Make sure that you are using your inhaler correctly. Ask you doctor or health care provider if you have any questions. Talk to your pediatrician regarding the use of this medicine in children. Special care may be needed. Overdosage: If you think you have taken too much of this medicine contact a poison control center or emergency room at once. NOTE: This medicine is only for you. Do not share this medicine with others. What if I miss a dose? If you miss a dose, use it as soon as you can. If it is almost time for your next dose, use only that dose. Do not use double or extra doses. What may interact with this medicine? -anti-infectives like chloroquine and pentamidine -caffeine -cisapride -diuretics -medicines for colds -medicines for depression or for emotional or psychotic conditions -medicines for weight loss including some herbal  products -methadone -some antibiotics like clarithromycin, erythromycin, levofloxacin, and linezolid -some heart medicines -steroid hormones like dexamethasone, cortisone, hydrocortisone -theophylline -thyroid hormones This list may not describe all possible interactions. Give your health care provider a list of all the medicines, herbs, non-prescription drugs, or dietary supplements you use. Also tell them if you smoke, drink alcohol, or use illegal drugs. Some items may interact with your medicine. What should I watch for while using this medicine? Tell your doctor or health care professional if your symptoms do not improve. Do not use extra albuterol. If your asthma or bronchitis gets worse while you are using this medicine, call your doctor right away. If your mouth gets dry try chewing sugarless gum or sucking hard candy. Drink water as directed. What side effects may I notice from receiving this medicine? Side effects that you should report to your doctor or health care professional as soon as possible: -allergic reactions like skin rash, itching or hives, swelling of the face, lips, or tongue -breathing problems -chest pain -feeling faint or lightheaded, falls -high blood pressure -irregular heartbeat -fever -muscle cramps or weakness -pain, tingling, numbness in the hands or feet -vomiting Side effects that usually do not require medical attention (report to your doctor or health care professional if they continue or are bothersome): -cough -difficulty sleeping -headache -nervousness or trembling -stomach upset -stuffy or runny nose -throat irritation -unusual taste This list may not describe all possible side effects. Call your doctor for medical advice about side effects. You may report side effects to FDA at 1-800-FDA-1088. Where should I keep my medicine? Keep out of the reach of children. Store at room temperature between 15 and 30 degrees  C (59 and 86 degrees F). The  contents are under pressure and may burst when exposed to heat or flame. Do not freeze. This medicine does not work as well if it is too cold. Throw away any unused medicine after the expiration date. Inhalers need to be thrown away after the labeled number of puffs have been used or by the expiration date; whichever comes first. Ventolin HFA should be thrown away 12 months after removing from foil pouch. Check the instructions that come with your medicine. NOTE: This sheet is a summary. It may not cover all possible information. If you have questions about this medicine, talk to your doctor, pharmacist, or health care provider.  2017 Elsevier/Gold Standard (2012-12-03 10:57:17)   Acute Bronchitis, Adult Acute bronchitis is when air tubes (bronchi) in the lungs suddenly get swollen. The condition can make it hard to breathe. It can also cause these symptoms:  A cough.  Coughing up clear, yellow, or green mucus.  Wheezing.  Chest congestion.  Shortness of breath.  A fever.  Body aches.  Chills.  A sore throat. Follow these instructions at home: Medicines  Take over-the-counter and prescription medicines only as told by your doctor.  If you were prescribed an antibiotic medicine, take it as told by your doctor. Do not stop taking the antibiotic even if you start to feel better. General instructions  Rest.  Drink enough fluids to keep your pee (urine) clear or pale yellow.  Avoid smoking and secondhand smoke. If you smoke and you need help quitting, ask your doctor. Quitting will help your lungs heal faster.  Use an inhaler, cool mist vaporizer, or humidifier as told by your doctor.  Keep all follow-up visits as told by your doctor. This is important. How is this prevented? To lower your risk of getting this condition again:  Wash your hands often with soap and water. If you cannot use soap and water, use hand sanitizer.  Avoid contact with people who have cold  symptoms.  Try not to touch your hands to your mouth, nose, or eyes.  Make sure to get the flu shot every year. Contact a doctor if:  Your symptoms do not get better in 2 weeks. Get help right away if:  You cough up blood.  You have chest pain.  You have very bad shortness of breath.  You become dehydrated.  You faint (pass out) or keep feeling like you are going to pass out.  You keep throwing up (vomiting).  You have a very bad headache.  Your fever or chills gets worse. This information is not intended to replace advice given to you by your health care provider. Make sure you discuss any questions you have with your health care provider. Document Released: 12/04/2007 Document Revised: 01/24/2016 Document Reviewed: 12/06/2015 Elsevier Interactive Patient Education  2017 [employer].

## 2016-06-03 NOTE — Telephone Encounter (Signed)
Call from patient reporting she cannot receive antibiotic prescribed today because of a problem with another drug.   Called [facility] who report prolonged QT interval with citalopram and Levaquin.  Verbal order received and read back from Selena Lesser NP for use of Levaquin.  Do not take Levaquin and Citalopram ay the same time.   Pharmacist given these orders and called [name].

## 2016-06-04 ENCOUNTER — Ambulatory Visit: Payer: Medicare Other

## 2016-06-04 ENCOUNTER — Encounter: Payer: Self-pay | Admitting: Nurse Practitioner

## 2016-06-04 NOTE — Assessment & Plan Note (Addendum)
Patient presented to the [facility] today to receive cycle 3, day 8 of her Cytoxan/Kyprolis  chemotherapy regimen.  However, patient continues with worsening bronchitis symptoms today.  She complains of a congested, productive cough.  She denies any recent fevers or chills.  Exam today reveals wheezing bilaterally and some congested cough as well.  Patient was afebrile.  Chest x-ray obtained today was negative for acute findings.  Patient will be prescribed Levaquin antibiotics and albuterol inhaler for treatment of bronchitis symptoms.  Also, patient was advised to call/return or go directly to the emergency department for any worsening symptoms whatsoever.

## 2016-06-04 NOTE — Progress Notes (Signed)
SYMPTOM MANAGEMENT CLINIC    Chief Complaint: Bronchitis  HPI:  [name] 65 y.o. female diagnosed with multiple myeloma.  Currently undergoing Cytoxan/carfilzomib chemotherapy regimen.    No history exists.    Review of Systems  Constitutional: Positive for malaise/fatigue.  Respiratory: Positive for cough, sputum production and wheezing.   All other systems reviewed and are negative.   Past Medical History:  Diagnosis Date  . Compression fracture 04/08/2007   pathologic compression fracture  . Constipation    takes oxycontin,vicodin  . Family history of anesthesia complication    "daughter gets PONV too"  . FHx: chemotherapy    s/p 5 cycle revlimid/low dose decadron,s/p velcade,doxil,decadron,  . GERD (gastroesophageal reflux disease)   . 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 Hx of radiation therapy 05/05/07-05/18/07,& 03/05/11-03/21/11-   l3&l5 in 2008, t2-t6 03/2011  . Hx of radiation therapy 05/05/2007 to 05/18/2007   palliative, L3-5  . Hx of radiation therapy 03/05/2011 to 03/21/2011   palliative T2-T6, c-spine  . Hypercholesterolemia   . Hypothyroidism   . Insomnia    associated with steroids  . Metastasis to bone ([place])   . Multiple myeloma ([place]) dx'd 2009  . Pneumonia    "several times"  . PONV (postoperative nausea and vomiting)   . Stroke [facility]) 2014   denies residual on 01/27/2014    Past Surgical History:  Procedure Laterality Date  . CHOLECYSTECTOMY  1980's  . KNEE ARTHROSCOPY Right    "put pin in"  . KNEE ARTHROSCOPY Right    "took pin out and corrected what was wrong"  . PORTACATH PLACEMENT Right 2009  . VAGINAL HYSTERECTOMY  1980's  . VIDEO BRONCHOSCOPY  07/30/2011   Procedure: VIDEO BRONCHOSCOPY WITHOUT FLUORO;  Surgeon: Kathee Delton, MD;  Location: [facility];  Service: Cardiopulmonary;  Laterality: Bilateral;    has Multiple myeloma in relapse ([place]); Hypothyroidism; GERD; Essential  hypertension; History of autologous stem cell transplant ([place]); Hyperlipidemia; Medication management; Vitamin D deficiency; CKD stage 3 due to type 2 diabetes mellitus ([place]); Pain and swelling of left lower extremity; Encounter for antineoplastic chemotherapy; Lytic bone lesions on xray; Dehydration; Peripheral edema; Nausea with vomiting; Bronchitis; Hypomagnesemia; Diarrhea; Dizziness; HCAP (healthcare-associated pneumonia); Weakness generalized; Edema; and Cancer associated pain on her problem list.    has No Known Allergies.    Medication List       Accurate as of 06/03/16 11:59 PM. Always use your most recent med list.          albuterol 108 (90 Base) MCG/ACT inhaler Commonly known as:  PROVENTIL HFA;VENTOLIN HFA Inhale 1-2 puffs into the lungs every 4 (four) hours as needed for wheezing or shortness of breath (cough).   ALPRAZolam 0.5 MG tablet Commonly known as:  XANAX TAKE 1/2 TO 1 TABLET 2 TO 3 TIMES DAILY AS NEEDED FOR ANXIETY   aspirin 81 MG tablet Take 81 mg by mouth as directed. Mondays, Wednesdays, and Fridays   blood glucose meter kit and supplies Test blood sugars once a day   brompheniramine-pseudoephedrine-DM 30-2-10 MG/5ML syrup Take 5 mLs by mouth 4 (four) times daily as needed.   citalopram 40 MG tablet Commonly known as:  CELEXA TAKE 1 TABLET (40 MG TOTAL) BY MOUTH DAILY.   cyclobenzaprine 10 MG tablet Commonly known as:  FLEXERIL TAKE 1 TABLET 3 TIMES A DAY AS NEEDED FOR MUSCLE SPASMS   dexamethasone 4 MG tablet Commonly known as:  DECADRON TAKE (  10) TABLETS BY MOUTH EVERY FRIDAY   diphenoxylate-atropine 2.5-0.025 MG tablet Commonly known as:  LOMOTIL TAKE 2 TABLETS BY MOUTH 4 TIMES A DAY AS NEEDED FOR DIARRHEA   Eszopiclone 3 MG Tabs TAKE 1 TABLET AT BEDTIME AS NEEDED SLEEP   furosemide 40 MG tablet Commonly known as:  LASIX TAKE 1 TABLET BY MOUTH TWICE A DAY FOR FLUID AND SWELLING   gabapentin 600 MG tablet Commonly known as:   NEURONTIN TAKE 1 TABLET BY MOUTH 4 TIMES A DAY AS NEEDED FOR PAIN OR CRAMPS   HYDROcodone-acetaminophen 7.5-325 MG tablet Commonly known as:  NORCO Take 1 tablet by mouth every 6 (six) hours as needed for moderate pain.   levofloxacin 500 MG tablet Commonly known as:  LEVAQUIN Take 1 tablet (500 mg total) by mouth daily.   loperamide 2 MG capsule Commonly known as:  IMODIUM Take 2 mg by mouth as needed for diarrhea or loose stools. Reported on 10/02/2015   Magnesium 500 MG Tabs Take 500 mg by mouth 2 (two) times daily.   metolazone 2.5 MG tablet Commonly known as:  ZAROXOLYN PLEASE TAKE 1 TABLET ON WEDNESDAY AND SUNDAY IN THE MORNING FOR SWELLING.   mometasone 50 MCG/ACT nasal spray Commonly known as:  NASONEX PLACE 2 SPRAYS INTO THE NOSE DAILY.   montelukast 10 MG tablet Commonly known as:  SINGULAIR TAKE 1 TABLET BY MOUTH EVERY DAY   morphine 15 MG 12 hr tablet Commonly known as:  MS CONTIN Take 1 tablet (15 mg total) by mouth every 12 (twelve) hours.   ondansetron 8 MG disintegrating tablet Commonly known as:  ZOFRAN-ODT TAKE 1 TABLET BY MOUTH EVERY 8 HOURS AS NEEDED FOR NAUSEA AND VOMITING   ONE TOUCH ULTRA TEST test strip Generic drug:  glucose blood TEST BLOOD SUGARS ONCE DAILY   pantoprazole 40 MG tablet Commonly known as:  PROTONIX TAKE 1 TABLET (40 MG TOTAL) BY MOUTH 2 (TWO) TIMES DAILY.   potassium chloride 10 MEQ tablet Commonly known as:  KLOR-CON 10 Take 2 tablets by mouth 2 times daily.   predniSONE 20 MG tablet Commonly known as:  DELTASONE 3 tabs po daily x 3 days, then 2 tabs x 3 days, then 1.5 tabs x 3 days, then 1 tab x 3 days, then 0.5 tabs x 3 days   prochlorperazine 10 MG tablet Commonly known as:  COMPAZINE Take 1 tablet (10 mg total) by mouth every 6 (six) hours as needed for nausea or vomiting.   SYNTHROID 100 MCG tablet Generic drug:  levothyroxine TAKE 1 TABLET BY MOUTH DAILY   traZODone 150 MG tablet Commonly known as:   DESYREL TAKE 1/2 TO 1 TABLET 1 HOUR BEFORE SLEEP.        PHYSICAL EXAMINATION  Oncology Vitals 06/03/2016 05/28/2016  Height - -  Weight - -  Weight (lbs) - -  BMI (kg/m2) - -  Temp 99.1 98.8  Pulse 105 99  Resp 18 18  Resp (Historical as of 01/30/12) - -  SpO2 98 100  BSA (m2) - -   BP Readings from Last 2 Encounters:  06/03/16 122/75  05/28/16 126/70    Physical Exam  Constitutional: She is oriented to person, place, and time and well-developed, well-nourished, and in no distress.  HENT:  Head: Normocephalic and atraumatic.  Mouth/Throat: Oropharynx is clear and moist.  Eyes: Conjunctivae and EOM are normal. Pupils are equal, round, and reactive to light. Right eye exhibits no discharge. Left eye exhibits no discharge. No  scleral icterus.  Neck: Normal range of motion. Neck supple. No JVD present. No tracheal deviation present. No thyromegaly present.  Cardiovascular: Normal rate, regular rhythm, normal heart sounds and intact distal pulses.   Pulmonary/Chest: Effort normal. No respiratory distress. She has wheezes. She has rales. She exhibits no tenderness.  Abdominal: Soft. Bowel sounds are normal. She exhibits no distension and no mass. There is no tenderness. There is no rebound and no guarding.  Musculoskeletal: Normal range of motion. She exhibits no edema or tenderness.  Lymphadenopathy:    She has no cervical adenopathy.  Neurological: She is alert and oriented to person, place, and time. Gait normal.  Skin: Skin is warm and dry. No rash noted. No erythema. No pallor.  Psychiatric: Affect normal.  Nursing note and vitals reviewed.   LABORATORY DATA:. Appointment on 06/03/2016  Component Date Value Ref Range Status  . WBC 06/03/2016 3.8* 3.9 - 10.3 10e3/uL Final  . NEUT# 06/03/2016 2.3  1.5 - 6.5 10e3/uL Final  . HGB 06/03/2016 9.6* 11.6 - 15.9 g/dL Final  . HCT 06/03/2016 30.4* 34.8 - 46.6 % Final  . Platelets 06/03/2016 102* 145 - 400 10e3/uL Final  . MCV  06/03/2016 107.5* 79.5 - 101.0 fL Final  . MCH 06/03/2016 34.1* 25.1 - 34.0 pg Final  . MCHC 06/03/2016 31.7  31.5 - 36.0 g/dL Final  . RBC 06/03/2016 2.83* 3.70 - 5.45 10e6/uL Final  . RDW 06/03/2016 17.7* 11.2 - 14.5 % Final  . lymph# 06/03/2016 1.0  0.9 - 3.3 10e3/uL Final  . MONO# 06/03/2016 0.5  0.1 - 0.9 10e3/uL Final  . Eosinophils Absolute 06/03/2016 0.0  0.0 - 0.5 10e3/uL Final  . Basophils Absolute 06/03/2016 0.0  0.0 - 0.1 10e3/uL Final  . NEUT% 06/03/2016 60.7  38.4 - 76.8 % Final  . LYMPH% 06/03/2016 25.1  14.0 - 49.7 % Final  . MONO% 06/03/2016 13.1  0.0 - 14.0 % Final  . EOS% 06/03/2016 0.8  0.0 - 7.0 % Final  . BASO% 06/03/2016 0.3  0.0 - 2.0 % Final  . Sodium 06/03/2016 139  136 - 145 mEq/L Final  . Potassium 06/03/2016 3.9  3.5 - 5.1 mEq/L Final  . Chloride 06/03/2016 107  98 - 109 mEq/L Final  . CO2 06/03/2016 22  22 - 29 mEq/L Final  . Glucose 06/03/2016 123  70 - 140 mg/dl Final  . BUN 06/03/2016 12.3  7.0 - 26.0 mg/dL Final  . Creatinine 06/03/2016 1.0  0.6 - 1.1 mg/dL Final  . Total Bilirubin 06/03/2016 0.73  0.20 - 1.20 mg/dL Final  . Alkaline Phosphatase 06/03/2016 96  40 - 150 U/L Final  . AST 06/03/2016 11  5 - 34 U/L Final  . ALT 06/03/2016 11  0 - 55 U/L Final  . Total Protein 06/03/2016 6.9  6.4 - 8.3 g/dL Final  . Albumin 06/03/2016 2.9* 3.5 - 5.0 g/dL Final  . Calcium 06/03/2016 9.8  8.4 - 10.4 mg/dL Final  . Anion Gap 06/03/2016 9  3 - 11 mEq/L Final  . EGFR 06/03/2016 72* >90 ml/min/1.73 m2 Final    RADIOGRAPHIC STUDIES: Dg Chest 2 View  Result Date: 06/03/2016 CLINICAL DATA:  Multiple myeloma. Productive cough, congestion, and shortness of breath. Former smoker. EXAM: CHEST  2 VIEW COMPARISON:  03/20/2016 FINDINGS: Right jugular Port-A-Cath terminates over the lower SVC, unchanged. Cardiomediastinal silhouette is within normal limits. The patient has taken a shallower inspiration than on the prior study, particularly on the lateral radiographs. No  confluent airspace opacity, edema, pleural effusion, or pneumothorax is identified. No acute osseous abnormality is seen. IMPRESSION: No active cardiopulmonary disease. Electronically Signed   By: Logan Bores M.D.   On: 06/03/2016 10:40    ASSESSMENT/PLAN:    Multiple myeloma in relapse [facility]) Patient presented to the [place] today to receive cycle 3, day 8 of her Cytoxan/Kyprolis  chemotherapy regimen.  However, patient has worsening bronchitis symptoms today and decision was made to hold her chemotherapy.  Patient is scheduled to return on 06/10/2016 for labs and chemotherapy again.  Bronchitis Patient presented to the [place] today to receive cycle 3, day 8 of her Cytoxan/Kyprolis  chemotherapy regimen.  However, patient continues with worsening bronchitis symptoms today.  She complains of a congested, productive cough.  She denies any recent fevers or chills.  Exam today reveals wheezing bilaterally and some congested cough as well.  Patient was afebrile.  Chest x-ray obtained today was negative for acute findings.  Patient will be prescribed Levaquin antibiotics and albuterol inhaler for treatment of bronchitis symptoms.  Also, patient was advised to call/return or go directly to the emergency department for any worsening symptoms whatsoever.     Patient stated understanding of all instructions; and was in agreement with this plan of care. The patient knows to call the clinic with any problems, questions or concerns.   Total time spent with patient was 15 minutes;  with greater than 75 percent of that time spent in face to face counseling regarding patient's symptoms,  and coordination of care and follow up.  Disclaimer:This dictation was prepared with Dragon/digital dictation along with [employer]. Any transcriptional errors that result from this process are unintentional.  Drue Second, NP 06/04/2016

## 2016-06-04 NOTE — Assessment & Plan Note (Signed)
Patient presented to the [facility] today to receive cycle 3, day 8 of her Cytoxan/Kyprolis  chemotherapy regimen.  However, patient has worsening bronchitis symptoms today and decision was made to hold her chemotherapy.  Patient is scheduled to return on 06/10/2016 for labs and chemotherapy again.

## 2016-06-10 ENCOUNTER — Other Ambulatory Visit: Payer: Self-pay | Admitting: Internal Medicine

## 2016-06-10 ENCOUNTER — Other Ambulatory Visit (HOSPITAL_BASED_OUTPATIENT_CLINIC_OR_DEPARTMENT_OTHER): Payer: Medicare Other

## 2016-06-10 ENCOUNTER — Ambulatory Visit (HOSPITAL_BASED_OUTPATIENT_CLINIC_OR_DEPARTMENT_OTHER): Payer: Medicare Other | Admitting: Oncology

## 2016-06-10 ENCOUNTER — Ambulatory Visit (HOSPITAL_BASED_OUTPATIENT_CLINIC_OR_DEPARTMENT_OTHER): Payer: Medicare Other

## 2016-06-10 ENCOUNTER — Telehealth: Payer: Self-pay | Admitting: Internal Medicine

## 2016-06-10 ENCOUNTER — Encounter: Payer: Self-pay | Admitting: Oncology

## 2016-06-10 VITALS — BP 135/61 | HR 95 | Temp 98.3°F | Resp 18 | Ht 65.0 in | Wt 216.0 lb

## 2016-06-10 DIAGNOSIS — C9002 Multiple myeloma in relapse: Secondary | ICD-10-CM | POA: Diagnosis not present

## 2016-06-10 DIAGNOSIS — G620 Drug-induced polyneuropathy: Secondary | ICD-10-CM | POA: Diagnosis not present

## 2016-06-10 DIAGNOSIS — Z5111 Encounter for antineoplastic chemotherapy: Secondary | ICD-10-CM

## 2016-06-10 DIAGNOSIS — C9000 Multiple myeloma not having achieved remission: Secondary | ICD-10-CM

## 2016-06-10 DIAGNOSIS — Z5112 Encounter for antineoplastic immunotherapy: Secondary | ICD-10-CM

## 2016-06-10 LAB — CBC WITH DIFFERENTIAL/PLATELET
BASO%: 0.2 % (ref 0.0–2.0)
Basophils Absolute: 0 10*3/uL (ref 0.0–0.1)
EOS ABS: 0 10*3/uL (ref 0.0–0.5)
EOS%: 1.2 % (ref 0.0–7.0)
HEMATOCRIT: 29.4 % — AB (ref 34.8–46.6)
HGB: 9.3 g/dL — ABNORMAL LOW (ref 11.6–15.9)
LYMPH%: 26.5 % (ref 14.0–49.7)
MCH: 35 pg — ABNORMAL HIGH (ref 25.1–34.0)
MCHC: 31.6 g/dL (ref 31.5–36.0)
MCV: 110.6 fL — ABNORMAL HIGH (ref 79.5–101.0)
MONO#: 0.5 10*3/uL (ref 0.1–0.9)
MONO%: 13.2 % (ref 0.0–14.0)
NEUT%: 58.9 % (ref 38.4–76.8)
NEUTROS ABS: 2.1 10*3/uL (ref 1.5–6.5)
PLATELETS: 148 10*3/uL (ref 145–400)
RBC: 2.65 10*6/uL — ABNORMAL LOW (ref 3.70–5.45)
RDW: 17.6 % — ABNORMAL HIGH (ref 11.2–14.5)
WBC: 3.5 10*3/uL — ABNORMAL LOW (ref 3.9–10.3)
lymph#: 0.9 10*3/uL (ref 0.9–3.3)

## 2016-06-10 LAB — COMPREHENSIVE METABOLIC PANEL
ALT: 12 U/L (ref 0–55)
ANION GAP: 10 meq/L (ref 3–11)
AST: 10 U/L (ref 5–34)
Albumin: 3 g/dL — ABNORMAL LOW (ref 3.5–5.0)
Alkaline Phosphatase: 96 U/L (ref 40–150)
BILIRUBIN TOTAL: 0.79 mg/dL (ref 0.20–1.20)
BUN: 8.6 mg/dL (ref 7.0–26.0)
CALCIUM: 8.9 mg/dL (ref 8.4–10.4)
CO2: 22 meq/L (ref 22–29)
CREATININE: 1.1 mg/dL (ref 0.6–1.1)
Chloride: 108 mEq/L (ref 98–109)
EGFR: 59 mL/min/{1.73_m2} — ABNORMAL LOW (ref >90)
Glucose: 125 mg/dl (ref 70–140)
Potassium: 3.8 mEq/L (ref 3.5–5.1)
Sodium: 140 mEq/L (ref 136–145)
TOTAL PROTEIN: 6.6 g/dL (ref 6.4–8.3)

## 2016-06-10 MED ORDER — ZOLEDRONIC ACID 4 MG/100ML IV SOLN
4.0000 mg | Freq: Once | INTRAVENOUS | Status: AC
  Administered 2016-06-10: 4 mg via INTRAVENOUS
  Filled 2016-06-10: qty 100

## 2016-06-10 MED ORDER — MORPHINE SULFATE ER 15 MG PO TBCR: 15.0000 mg | EXTENDED_RELEASE_TABLET | Freq: Two times a day (BID) | ORAL | 0 refills | Status: AC

## 2016-06-10 MED ORDER — SODIUM CHLORIDE 0.9 % IV SOLN: Freq: Once | INTRAVENOUS | Status: DC

## 2016-06-10 MED ORDER — SODIUM CHLORIDE 0.9 % IV SOLN
300.0000 mg/m2 | Freq: Once | INTRAVENOUS | Status: AC
  Administered 2016-06-10: 620 mg via INTRAVENOUS
  Filled 2016-06-10: qty 31

## 2016-06-10 MED ORDER — DEXAMETHASONE SODIUM PHOSPHATE 10 MG/ML IJ SOLN: 10.0000 mg | Freq: Once | INTRAMUSCULAR | Status: DC

## 2016-06-10 MED ORDER — PALONOSETRON HCL INJECTION 0.25 MG/5ML
INTRAVENOUS | Status: AC
  Filled 2016-06-10: qty 5

## 2016-06-10 MED ORDER — HEPARIN SOD (PORK) LOCK FLUSH 100 UNIT/ML IV SOLN
500.0000 [IU] | Freq: Once | INTRAVENOUS | Status: AC | PRN
  Administered 2016-06-10: 500 [IU]
  Filled 2016-06-10: qty 5

## 2016-06-10 MED ORDER — DEXTROSE 5 % IV SOLN: 27.0000 mg/m2 | Freq: Once | INTRAVENOUS | Status: DC

## 2016-06-10 MED ORDER — PALONOSETRON HCL INJECTION 0.25 MG/5ML
0.2500 mg | Freq: Once | INTRAVENOUS | Status: AC
  Administered 2016-06-10: 0.25 mg via INTRAVENOUS

## 2016-06-10 MED ORDER — DEXAMETHASONE SODIUM PHOSPHATE 10 MG/ML IJ SOLN
10.0000 mg | Freq: Once | INTRAMUSCULAR | Status: AC
Start: 2016-06-10 — End: 2016-06-10
  Administered 2016-06-10: 10 mg via INTRAVENOUS

## 2016-06-10 MED ORDER — DEXAMETHASONE SODIUM PHOSPHATE 10 MG/ML IJ SOLN
INTRAMUSCULAR | Status: AC
  Filled 2016-06-10: qty 1

## 2016-06-10 MED ORDER — SODIUM CHLORIDE 0.9% FLUSH
10.0000 mL | INTRAVENOUS | Status: DC | PRN
  Filled 2016-06-10: qty 10

## 2016-06-10 MED ORDER — PSEUDOEPH-BROMPHEN-DM 30-2-10 MG/5ML PO SYRP: 5.0000 mL | ORAL_SOLUTION | Freq: Four times a day (QID) | ORAL | 3 refills | Status: AC | PRN

## 2016-06-10 MED ORDER — PALONOSETRON HCL INJECTION 0.25 MG/5ML: 0.2500 mg | Freq: Once | INTRAVENOUS | Status: DC

## 2016-06-10 MED ORDER — HEPARIN SOD (PORK) LOCK FLUSH 100 UNIT/ML IV SOLN
500.0000 [IU] | Freq: Once | INTRAVENOUS | Status: DC | PRN
  Filled 2016-06-10: qty 5

## 2016-06-10 MED ORDER — DEXTROSE 5 % IV SOLN
27.0000 mg/m2 | Freq: Once | INTRAVENOUS | Status: AC
  Administered 2016-06-10: 56 mg via INTRAVENOUS
  Filled 2016-06-10: qty 28

## 2016-06-10 MED ORDER — SODIUM CHLORIDE 0.9 % IV SOLN: 300.0000 mg/m2 | Freq: Once | INTRAVENOUS | Status: DC

## 2016-06-10 MED ORDER — SODIUM CHLORIDE 0.9% FLUSH
10.0000 mL | INTRAVENOUS | Status: DC | PRN
  Administered 2016-06-10: 10 mL
  Filled 2016-06-10: qty 10

## 2016-06-10 MED ORDER — SODIUM CHLORIDE 0.9 % IV SOLN
Freq: Once | INTRAVENOUS | Status: AC
  Administered 2016-06-10: 11:00:00 via INTRAVENOUS

## 2016-06-10 NOTE — Progress Notes (Signed)
[place] [phone]   Fax:([phone] [place], [place] [address]  DIAGNOSIS: Recurrent multiple myeloma initially diagnosed in October 2008.   PRIOR THERAPY:  1. Status post palliative radiotherapy to the lumbar spine between L3 and L5. The patient received a total dose of 3000 cGy in 10 fractions under the care of Dr. Lisbeth Renshaw between May 05, 2007 through May 18, 2007. 2. Status post 5 cycles of systemic chemotherapy with Revlimid and low-dose Decadron with good response to this treatment. 3. Status post autologous peripheral blood stem cell transplant at [facility] on Nov 20, 2007 under the care of Dr. Valarie Merino. 4. Status post treatment for disease recurrence with Velcade, Doxil and Decadron. Last dose given Nov 09, 2009. Discontinued secondary to intolerance but the patient had a good response to treatment at that time. 5. Status post palliative radiotherapy to the T2-T6 thoracic vertebrae completed 03/21/2011 under the care of Dr. Lisbeth Renshaw. 6. Systemic chemotherapy with Velcade 1.3 mg per meter squared given on days 1, 4, 8 and 11, and Doxil at 30 mg per meter squared given on day 4 in addition to Decadron status post 4 cycles, discontinued secondary to intolerance. 7. Systemic therapy with Velcade 1.3 mg/M2 subcutaneously in addition to Decadron 40 mg by mouth on a weekly basis, status post 20 cycles. The patient had good response with this treatment but it is discontinue today secondary to worsening peripheral neuropathy. 8. Palliative radiotherapy to the skull lesion as well as the left hip area under the care of Dr. Lisbeth Renshaw. 9. Systemic chemotherapy with Carfilzomib 20 mg/M2 on days 1, 2,  8, 9, 13 and 16 every 4 weeks in addition to weekly Decadron 40 mg by mouth. First dose on 04/19/2013. Status post 4 cycles, discontinued recently secondary to cardiac  dysfunction. 10. Pomalyst 3 mg by mouth daily for 21 days every 4 weeks in addition to dexamethasone 40 mg on a weekly basis. First dose started 12/02/2013. Status post 28 cycles. Her dose of Pomalyst will be reduced to 2 mg by mouth daily for 21 days every 4 weeks starting from cycle 23, discontinued secondary to disease progression.    CURRENT THERAPY:  1. Systemic chemotherapy with Carfilzomib 27 MG/M2, Cytoxan 300 MG/M2 on days 1, 2, 8, 9, 15 and 16 every 4 weeks in addition to weekly Decadron 20 mg by mouth. First cycle 03/25/2016. Status post 2 cycles. 2. Zometa 4 mg IV given every 3 months.   INTERVAL HISTORY: [name] 65 y.o. female returns to the clinic today for follow up visit accompanied by her husband. The patient is on on treatment with Carfilzomib, Cytoxan and Decadron status post 2 cycles and tolerated her treatment fairly well. She was treated for bronchitis last week with Levaquin and Albuterol. She feels much better, but still has a lingering cough. Would like a refill on her cough syrup today. Chemo was held last week. She denied having any significant chest pain, but continues to have mild shortness breath with cough and no hemoptysis. She has no current nausea, vomiting, diarrhea or constipation. She has persistent low back pain and she is currently on pain medication with MS Contin and Norco. She denied having any significant weight loss or night sweats. She is here for evaluation prior to starting cycle #3 day 15/16 this week.  MEDICAL HISTORY: Past Medical History:  Diagnosis Date  . Compression fracture  04/08/2007   pathologic compression fracture  . Constipation    takes oxycontin,vicodin  . Family history of anesthesia complication    "daughter gets PONV too"  . FHx: chemotherapy    s/p 5 cycle revlimid/low dose decadron,s/p velcade,doxil,decadron,  . GERD (gastroesophageal reflux disease)   . History of autologous stem cell transplant ([place]) 11/20/2007    [facility], Dr Valarie Merino  . Hx of radiation therapy 05/05/07-05/18/07,& 03/05/11-03/21/11-   l3&l5 in 2008, t2-t6 03/2011  . Hx of radiation therapy 05/05/2007 to 05/18/2007   palliative, L3-5  . Hx of radiation therapy 03/05/2011 to 03/21/2011   palliative T2-T6, c-spine  . Hypercholesterolemia   . Hypothyroidism   . Insomnia    associated with steroids  . Metastasis to bone ([place])   . Multiple myeloma ([place]) dx'd 2009  . Pneumonia    "several times"  . PONV (postoperative nausea and vomiting)   . Stroke [facility]) 2014   denies residual on 01/27/2014    ALLERGIES:  has No Known Allergies.  MEDICATIONS:  Current Outpatient Prescriptions  Medication Sig Dispense Refill  . albuterol (PROVENTIL HFA;VENTOLIN HFA) 108 (90 Base) MCG/ACT inhaler Inhale 1-2 puffs into the lungs every 4 (four) hours as needed for wheezing or shortness of breath (cough). 1 Inhaler 1  . ALPRAZolam (XANAX) 0.5 MG tablet TAKE 1/2 TO 1 TABLET 2 TO 3 TIMES DAILY AS NEEDED FOR ANXIETY 90 tablet 3  . aspirin 81 MG tablet Take 81 mg by mouth as directed. Mondays, Wednesdays, and Fridays    . brompheniramine-pseudoephedrine-DM 30-2-10 MG/5ML syrup Take 5 mLs by mouth 4 (four) times daily as needed. 473 mL 3  . citalopram (CELEXA) 40 MG tablet TAKE 1 TABLET (40 MG TOTAL) BY MOUTH DAILY. 90 tablet 2  . cyclobenzaprine (FLEXERIL) 10 MG tablet TAKE 1 TABLET 3 TIMES A DAY AS NEEDED FOR MUSCLE SPASMS 90 tablet 3  . dexamethasone (DECADRON) 4 MG tablet TAKE (10) TABLETS BY MOUTH EVERY FRIDAY 40 tablet 1  . diphenoxylate-atropine (LOMOTIL) 2.5-0.025 MG tablet TAKE 2 TABLETS BY MOUTH 4 TIMES A DAY AS NEEDED FOR DIARRHEA 30 tablet 1  . Eszopiclone 3 MG TABS TAKE 1 TABLET AT BEDTIME AS NEEDED SLEEP 30 tablet 2  . furosemide (LASIX) 40 MG tablet TAKE 1 TABLET BY MOUTH TWICE A DAY FOR FLUID AND SWELLING 180 tablet 1  . gabapentin (NEURONTIN) 600 MG tablet TAKE 1 TABLET BY MOUTH 4 TIMES A DAY AS NEEDED FOR PAIN OR CRAMPS (Patient taking differently: 1bid  prn) 360 tablet 1  . HYDROcodone-acetaminophen (NORCO) 7.5-325 MG tablet Take 1 tablet by mouth every 6 (six) hours as needed for moderate pain. 45 tablet 0  . levofloxacin (LEVAQUIN) 500 MG tablet Take 1 tablet (500 mg total) by mouth daily. 10 tablet 0  . loperamide (IMODIUM) 2 MG capsule Take 2 mg by mouth as needed for diarrhea or loose stools. Reported on 10/02/2015    . Magnesium 500 MG TABS Take 500 mg by mouth 2 (two) times daily.     . metolazone (ZAROXOLYN) 2.5 MG tablet PLEASE TAKE 1 TABLET ON WEDNESDAY AND SUNDAY IN THE MORNING FOR SWELLING. 30 tablet 1  . mometasone (NASONEX) 50 MCG/ACT nasal spray PLACE 2 SPRAYS INTO THE NOSE DAILY. (Patient taking differently: PLACE 2 SPRAYS INTO THE NOSE DAILY AS NEEDED FOR ALLERGIES.) 51 g 1  . montelukast (SINGULAIR) 10 MG tablet TAKE 1 TABLET BY MOUTH EVERY DAY 90 tablet 1  . morphine (MS CONTIN) 15 MG 12 hr tablet Take  1 tablet (15 mg total) by mouth every 12 (twelve) hours. 60 tablet 0  . ondansetron (ZOFRAN-ODT) 8 MG disintegrating tablet TAKE 1 TABLET BY MOUTH EVERY 8 HOURS AS NEEDED FOR NAUSEA AND VOMITING 30 tablet 1  . pantoprazole (PROTONIX) 40 MG tablet TAKE 1 TABLET (40 MG TOTAL) BY MOUTH 2 (TWO) TIMES DAILY. (Patient taking differently: Take 40 mg by mouth daily. ) 90 tablet 1  . potassium chloride (KLOR-CON 10) 10 MEQ tablet Take 2 tablets by mouth 2 times daily. 360 tablet 1  . predniSONE (DELTASONE) 20 MG tablet 3 tabs po daily x 3 days, then 2 tabs x 3 days, then 1.5 tabs x 3 days, then 1 tab x 3 days, then 0.5 tabs x 3 days (Patient taking differently: Take 20 mg by mouth daily. 3 tabs po daily x 3 days, then 2 tabs x 3 days, then 1.5 tabs x 3 days, then 1 tab x 3 days, then 0.5 tabs x 3 days) 27 tablet 0  . prochlorperazine (COMPAZINE) 10 MG tablet Take 1 tablet (10 mg total) by mouth every 6 (six) hours as needed for nausea or vomiting. 30 tablet 0  . SYNTHROID 100 MCG tablet TAKE 1 TABLET BY MOUTH DAILY 90 tablet 1  . traZODone  (DESYREL) 150 MG tablet TAKE 1/2 TO 1 TABLET 1 HOUR BEFORE SLEEP. 90 tablet 3   Current Facility-Administered Medications  Medication Dose Route Frequency Provider Last Rate Last Dose  . ipratropium-albuterol (DUONEB) 0.5-2.5 (3) MG/3ML nebulizer solution 3 mL  3 mL Nebulization Once Starlyn Skeans, [name]        SURGICAL HISTORY:  Past Surgical History:  Procedure Laterality Date  . CHOLECYSTECTOMY  1980's  . KNEE ARTHROSCOPY Right    "put pin in"  . KNEE ARTHROSCOPY Right    "took pin out and corrected what was wrong"  . PORTACATH PLACEMENT Right 2009  . VAGINAL HYSTERECTOMY  1980's  . VIDEO BRONCHOSCOPY  07/30/2011   Procedure: VIDEO BRONCHOSCOPY WITHOUT FLUORO;  Surgeon: Kathee Delton, MD;  Location: [facility];  Service: Cardiopulmonary;  Laterality: Bilateral;    REVIEW OF SYSTEMS:  Constitutional: positive for fatigue Eyes: negative Ears, nose, mouth, throat, and face: negative Respiratory: positive for cough Cardiovascular: negative Gastrointestinal: negative Genitourinary:negative Integument/breast: negative Hematologic/lymphatic: negative Musculoskeletal:positive for back pain Neurological: negative Behavioral/Psych: negative Endocrine: negative Allergic/Immunologic: negative   PHYSICAL EXAMINATION: General appearance: alert, cooperative, fatigued and no distress Head: Normocephalic, without obvious abnormality, atraumatic Neck: no adenopathy, no JVD, supple, symmetrical, trachea midline and thyroid not enlarged, symmetric, no tenderness/mass/nodules Lymph nodes: Cervical, supraclavicular, and axillary nodes normal. Resp: clear to auscultation bilaterally Back: symmetric, no curvature. ROM normal. No CVA tenderness. Cardio: regular rate and rhythm, S1, S2 normal, no murmur, click, rub or gallop GI: soft, non-tender; bowel sounds normal; no masses,  no organomegaly Extremities: edema 1+ edema of left lower extremity. Neurologic: Alert and oriented X 3, normal  strength and tone. Normal symmetric reflexes. Normal coordination and gait  ECOG PERFORMANCE STATUS: 1 - Symptomatic but completely ambulatory  Blood pressure 135/61, pulse 95, temperature 98.3 F (36.8 C), temperature source Oral, resp. rate 18, height 5' 5"  (1.651 m), weight 216 lb (98 kg), SpO2 98 %.  LABORATORY DATA: Lab Results  Component Value Date   WBC 3.5 (L) 06/10/2016   HGB 9.3 (L) 06/10/2016   HCT 29.4 (L) 06/10/2016   MCV 110.6 (H) 06/10/2016   PLT 148 06/10/2016      Chemistry  Component Value Date/Time   NA 140 06/10/2016 0900   K 3.8 06/10/2016 0900   CL 107 03/20/2016 1001   CL 105 12/17/2012 1015   CO2 22 06/10/2016 0900   BUN 8.6 06/10/2016 0900   CREATININE 1.1 06/10/2016 0900      Component Value Date/Time   CALCIUM 8.9 06/10/2016 0900   ALKPHOS 96 06/10/2016 0900   AST 10 06/10/2016 0900   ALT 12 06/10/2016 0900   BILITOT 0.79 06/10/2016 0900     Myeloma panel Performed 03/11/2016 showed beta 2 microglobulin 2.3, IgG 1305, IgA 33 and IgM <5. Free kappa light chain 6.7, free lambda light chain 129 and a kappa/lambda ratio 0.05  RADIOGRAPHIC STUDIES: Dg Chest 2 View  Result Date: 06/03/2016 CLINICAL DATA:  Multiple myeloma. Productive cough, congestion, and shortness of breath. Former smoker. EXAM: CHEST  2 VIEW COMPARISON:  03/20/2016 FINDINGS: Right jugular Port-A-Cath terminates over the lower SVC, unchanged. Cardiomediastinal silhouette is within normal limits. The patient has taken a shallower inspiration than on the prior study, particularly on the lateral radiographs. No confluent airspace opacity, edema, pleural effusion, or pneumothorax is identified. No acute osseous abnormality is seen. IMPRESSION: No active cardiopulmonary disease. Electronically Signed   By: Logan Bores M.D.   On: 06/03/2016 10:40    ASSESSMENT AND PLAN:  This is a very pleasant 65 years old [place] American female with recurrent multiple myeloma recently completed a  course of treatment with Velcade and Decadron with improvement in her disease but this was discontinued secondary to peripheral neuropathy. The patient tolerated her treatment with Carfilzomib and Decadron fairly well except for the recent shortness breath and cough which was felt to be secondary to congestive heart failure from her treatment with Carfilzomib. This treatment was discontinued. She was started on treatment with Pomalyst and Decadron status post 28 cycles. She tolerated the last cycle of her treatment well. I recommended for the patient to continue her treatment with Pomalyst at reduced dose of 2 mg.  Unfortunately the recent myeloma panel showed significant elevation of the free lambda light chain. She is currently undergoing treatment with Carfilzomib, Cytoxan and Decadron status post 2 cycles. She tolerated the first 2 cycles of her treatment fairly well with no significant adverse effects.  Her myeloma panel showed significant improvement in her disease.  Treatment held last week due to bronchitis. She has improved this week.  I recommended for her to proceed with cycle #3 day 15 today and day 16 tomorrow.  She will come back for follow-up visit in 2 weeks for evaluation before starting cycle #4. For the back pain she will continue on MS Contin and Norco. I gave her refill of MS Contin.  Cough syrup was refilled.   She was advised to call immediately if she has any concerning symptoms in the interval. The patient voices understanding of current disease status and treatment options and is in agreement with the current care plan.  All questions were answered. The patient knows to call the clinic with any problems, questions or concerns. We can certainly see the patient much sooner if necessary.  Plan reviewed with Dr. Julien Nordmann.

## 2016-06-10 NOTE — Telephone Encounter (Signed)
No LOS entered for 06/10/16 date of service.

## 2016-06-10 NOTE — Patient Instructions (Signed)
[facility] Discharge Instructions for Patients Receiving Chemotherapy  Today you received the following chemotherapy agents:  Cyclophosphamide, Carfilzomib  To help prevent nausea and vomiting after your treatment, we encourage you to take your nausea medication as prescribed.   If you develop nausea and vomiting that is not controlled by your nausea medication, call the clinic.   BELOW ARE SYMPTOMS THAT SHOULD BE REPORTED IMMEDIATELY:  *FEVER GREATER THAN 100.5 F  *CHILLS WITH OR WITHOUT FEVER  NAUSEA AND VOMITING THAT IS NOT CONTROLLED WITH YOUR NAUSEA MEDICATION  *UNUSUAL SHORTNESS OF BREATH  *UNUSUAL BRUISING OR BLEEDING  TENDERNESS IN MOUTH AND THROAT WITH OR WITHOUT PRESENCE OF ULCERS  *URINARY PROBLEMS  *BOWEL PROBLEMS  UNUSUAL RASH Items with * indicate a potential emergency and should be followed up as soon as possible.  Feel free to call the clinic you have any questions or concerns. The clinic phone number is [phone].  Please show the [facility] at check-in to the Emergency Department and triage nurse.   Zoledronic Acid injection (Hypercalcemia, Oncology) What is this medicine? ZOLEDRONIC ACID (ZOE le dron ik AS id) lowers the amount of calcium loss from bone. It is used to treat too much calcium in your blood from cancer. It is also used to prevent complications of cancer that has spread to the bone. This medicine may be used for other purposes; ask your health care provider or pharmacist if you have questions. COMMON BRAND NAME(S): Zometa What should I tell my health care provider before I take this medicine? They need to know if you have any of these conditions: -aspirin-sensitive asthma -cancer, especially if you are receiving medicines used to treat cancer -dental disease or wear dentures -infection -kidney disease -receiving corticosteroids like dexamethasone or prednisone -an unusual or allergic reaction to zoledronic  acid, other medicines, foods, dyes, or preservatives -pregnant or trying to get pregnant -breast-feeding How should I use this medicine? This medicine is for infusion into a vein. It is given by a health care professional in a hospital or clinic setting. Talk to your pediatrician regarding the use of this medicine in children. Special care may be needed. Overdosage: If you think you have taken too much of this medicine contact a poison control center or emergency room at once. NOTE: This medicine is only for you. Do not share this medicine with others. What if I miss a dose? It is important not to miss your dose. Call your doctor or health care professional if you are unable to keep an appointment. What may interact with this medicine? -certain antibiotics given by injection -NSAIDs, medicines for pain and inflammation, like ibuprofen or naproxen -some diuretics like bumetanide, furosemide -teriparatide -thalidomide This list may not describe all possible interactions. Give your health care provider a list of all the medicines, herbs, non-prescription drugs, or dietary supplements you use. Also tell them if you smoke, drink alcohol, or use illegal drugs. Some items may interact with your medicine. What should I watch for while using this medicine? Visit your doctor or health care professional for regular checkups. It may be some time before you see the benefit from this medicine. Do not stop taking your medicine unless your doctor tells you to. Your doctor may order blood tests or other tests to see how you are doing. Women should inform their doctor if they wish to become pregnant or think they might be pregnant. There is a potential for serious side effects to an unborn child.  Talk to your health care professional or pharmacist for more information. You should make sure that you get enough calcium and vitamin D while you are taking this medicine. Discuss the foods you eat and the vitamins you  take with your health care professional. Some people who take this medicine have severe bone, joint, and/or muscle pain. This medicine may also increase your risk for jaw problems or a broken thigh bone. Tell your doctor right away if you have severe pain in your jaw, bones, joints, or muscles. Tell your doctor if you have any pain that does not go away or that gets worse. Tell your dentist and dental surgeon that you are taking this medicine. You should not have major dental surgery while on this medicine. See your dentist to have a dental exam and fix any dental problems before starting this medicine. Take good care of your teeth while on this medicine. Make sure you see your dentist for regular follow-up appointments. What side effects may I notice from receiving this medicine? Side effects that you should report to your doctor or health care professional as soon as possible: -allergic reactions like skin rash, itching or hives, swelling of the face, lips, or tongue -anxiety, confusion, or depression -breathing problems -changes in vision -eye pain -feeling faint or lightheaded, falls -jaw pain, especially after dental work -mouth sores -muscle cramps, stiffness, or weakness -redness, blistering, peeling or loosening of the skin, including inside the mouth -trouble passing urine or change in the amount of urine Side effects that usually do not require medical attention (report to your doctor or health care professional if they continue or are bothersome): -bone, joint, or muscle pain -constipation -diarrhea -fever -hair loss -irritation at site where injected -loss of appetite -nausea, vomiting -stomach upset -trouble sleeping -trouble swallowing -weak or tired This list may not describe all possible side effects. Call your doctor for medical advice about side effects. You may report side effects to FDA at 1-800-FDA-1088. Where should I keep my medicine? This drug is given in a  hospital or clinic and will not be stored at home. NOTE: This sheet is a summary. It may not cover all possible information. If you have questions about this medicine, talk to your doctor, pharmacist, or health care provider.  2017 Elsevier/Gold Standard (2013-11-13 14:19:39) [facility]

## 2016-06-11 ENCOUNTER — Ambulatory Visit (HOSPITAL_BASED_OUTPATIENT_CLINIC_OR_DEPARTMENT_OTHER): Payer: Medicare Other

## 2016-06-11 VITALS — BP 137/77 | HR 104 | Temp 99.2°F | Resp 18

## 2016-06-11 DIAGNOSIS — Z5112 Encounter for antineoplastic immunotherapy: Secondary | ICD-10-CM

## 2016-06-11 DIAGNOSIS — C9002 Multiple myeloma in relapse: Secondary | ICD-10-CM

## 2016-06-11 MED ORDER — SODIUM CHLORIDE 0.9% FLUSH
10.0000 mL | INTRAVENOUS | Status: DC | PRN
  Administered 2016-06-11: 10 mL
  Filled 2016-06-11: qty 10

## 2016-06-11 MED ORDER — SODIUM CHLORIDE 0.9 % IV SOLN
Freq: Once | INTRAVENOUS | Status: AC
  Administered 2016-06-11: 09:00:00 via INTRAVENOUS

## 2016-06-11 MED ORDER — DEXTROSE 5 % IV SOLN
27.0000 mg/m2 | Freq: Once | INTRAVENOUS | Status: AC
  Administered 2016-06-11: 56 mg via INTRAVENOUS
  Filled 2016-06-11: qty 28

## 2016-06-11 MED ORDER — DEXAMETHASONE SODIUM PHOSPHATE 10 MG/ML IJ SOLN
INTRAMUSCULAR | Status: AC
  Filled 2016-06-11: qty 1

## 2016-06-11 MED ORDER — DEXAMETHASONE SODIUM PHOSPHATE 10 MG/ML IJ SOLN
10.0000 mg | Freq: Once | INTRAMUSCULAR | Status: AC
  Administered 2016-06-11: 10 mg via INTRAVENOUS

## 2016-06-11 MED ORDER — HEPARIN SOD (PORK) LOCK FLUSH 100 UNIT/ML IV SOLN
500.0000 [IU] | Freq: Once | INTRAVENOUS | Status: AC | PRN
  Administered 2016-06-11: 500 [IU]
  Filled 2016-06-11: qty 5

## 2016-06-11 NOTE — Patient Instructions (Signed)
[facility] Discharge Instructions for Patients Receiving Chemotherapy  Today you received the following chemotherapy agents: Kyprolis   To help prevent nausea and vomiting after your treatment, we encourage you to take your nausea medication as directed.    If you develop nausea and vomiting that is not controlled by your nausea medication, call the clinic.   BELOW ARE SYMPTOMS THAT SHOULD BE REPORTED IMMEDIATELY:  *FEVER GREATER THAN 100.5 F  *CHILLS WITH OR WITHOUT FEVER  NAUSEA AND VOMITING THAT IS NOT CONTROLLED WITH YOUR NAUSEA MEDICATION  *UNUSUAL SHORTNESS OF BREATH  *UNUSUAL BRUISING OR BLEEDING  TENDERNESS IN MOUTH AND THROAT WITH OR WITHOUT PRESENCE OF ULCERS  *URINARY PROBLEMS  *BOWEL PROBLEMS  UNUSUAL RASH Items with * indicate a potential emergency and should be followed up as soon as possible.  Feel free to call the clinic you have any questions or concerns. The clinic phone number is [phone].  Please show the CHEMO ALERT CARD at check-in to the Emergency Department and triage nurse.   

## 2016-06-12 ENCOUNTER — Ambulatory Visit: Payer: Self-pay | Admitting: Internal Medicine

## 2016-06-17 ENCOUNTER — Ambulatory Visit: Payer: Self-pay | Admitting: Internal Medicine

## 2016-06-20 ENCOUNTER — Encounter: Payer: Self-pay | Admitting: Physician Assistant

## 2016-06-20 ENCOUNTER — Ambulatory Visit (INDEPENDENT_AMBULATORY_CARE_PROVIDER_SITE_OTHER): Payer: Medicare Other | Admitting: Physician Assistant

## 2016-06-20 VITALS — BP 128/60 | HR 83 | Temp 97.5°F | Resp 16 | Ht 65.0 in | Wt 218.8 lb

## 2016-06-20 DIAGNOSIS — Z9484 Stem cells transplant status: Secondary | ICD-10-CM | POA: Diagnosis not present

## 2016-06-20 DIAGNOSIS — K2100 Gastro-esophageal reflux disease with esophagitis, without bleeding: Secondary | ICD-10-CM

## 2016-06-20 DIAGNOSIS — R112 Nausea with vomiting, unspecified: Secondary | ICD-10-CM

## 2016-06-20 DIAGNOSIS — E1122 Type 2 diabetes mellitus with diabetic chronic kidney disease: Secondary | ICD-10-CM | POA: Diagnosis not present

## 2016-06-20 DIAGNOSIS — M899 Disorder of bone, unspecified: Secondary | ICD-10-CM | POA: Diagnosis not present

## 2016-06-20 DIAGNOSIS — Z79899 Other long term (current) drug therapy: Secondary | ICD-10-CM

## 2016-06-20 DIAGNOSIS — R05 Cough: Secondary | ICD-10-CM

## 2016-06-20 DIAGNOSIS — Z0001 Encounter for general adult medical examination with abnormal findings: Secondary | ICD-10-CM

## 2016-06-20 DIAGNOSIS — E782 Mixed hyperlipidemia: Secondary | ICD-10-CM | POA: Diagnosis not present

## 2016-06-20 DIAGNOSIS — M79605 Pain in left leg: Secondary | ICD-10-CM

## 2016-06-20 DIAGNOSIS — J40 Bronchitis, not specified as acute or chronic: Secondary | ICD-10-CM | POA: Diagnosis not present

## 2016-06-20 DIAGNOSIS — E860 Dehydration: Secondary | ICD-10-CM

## 2016-06-20 DIAGNOSIS — D802 Selective deficiency of immunoglobulin A [IgA]: Secondary | ICD-10-CM | POA: Insufficient documentation

## 2016-06-20 DIAGNOSIS — I10 Essential (primary) hypertension: Secondary | ICD-10-CM | POA: Diagnosis not present

## 2016-06-20 DIAGNOSIS — C9002 Multiple myeloma in relapse: Secondary | ICD-10-CM | POA: Diagnosis not present

## 2016-06-20 DIAGNOSIS — Z0000 Encounter for general adult medical examination without abnormal findings: Secondary | ICD-10-CM

## 2016-06-20 DIAGNOSIS — R531 Weakness: Secondary | ICD-10-CM

## 2016-06-20 DIAGNOSIS — E559 Vitamin D deficiency, unspecified: Secondary | ICD-10-CM | POA: Diagnosis not present

## 2016-06-20 DIAGNOSIS — E039 Hypothyroidism, unspecified: Secondary | ICD-10-CM

## 2016-06-20 DIAGNOSIS — E43 Unspecified severe protein-calorie malnutrition: Secondary | ICD-10-CM

## 2016-06-20 DIAGNOSIS — G893 Neoplasm related pain (acute) (chronic): Secondary | ICD-10-CM

## 2016-06-20 DIAGNOSIS — R42 Dizziness and giddiness: Secondary | ICD-10-CM

## 2016-06-20 DIAGNOSIS — R059 Cough, unspecified: Secondary | ICD-10-CM

## 2016-06-20 DIAGNOSIS — R197 Diarrhea, unspecified: Secondary | ICD-10-CM

## 2016-06-20 DIAGNOSIS — M7989 Other specified soft tissue disorders: Secondary | ICD-10-CM

## 2016-06-20 DIAGNOSIS — R6889 Other general symptoms and signs: Secondary | ICD-10-CM

## 2016-06-20 DIAGNOSIS — N183 Chronic kidney disease, stage 3 (moderate): Secondary | ICD-10-CM

## 2016-06-20 MED ORDER — FLUTICASONE FUROATE-VILANTEROL 100-25 MCG/INH IN AEPB: 1.0000 | INHALATION_SPRAY | Freq: Every day | RESPIRATORY_TRACT | 4 refills | Status: AC

## 2016-06-20 NOTE — Patient Instructions (Addendum)
Will send you to pulmonary Any worsening shortness of breath or any chest pain go to the ER  Can do samples steroid inhaler if do not tolerate oral steroids, do 1 puff twice a day and wash mouth out afterwards to avoid yeast.   Claritin or loratadine cheapest but likely the weakest  Cheapest at [employer], [employer], [employer]  Increase lasix to 2 pills a day for 3 days, monitor weight   Sugar free hard candy and suck on this to try to decrease Take the tessalon pills 3-4 x a day to quiet the cough  Continue the protonix twice a day  Common causes of cough OR hoarseness OR sore throat:   Allergies, Viral Infections, Acid Reflux and Bacterial Infections.  1) Allergies and viral infections cause a cough OR sore throat by post nasal drip and are often worse at night, can also have sneezing, lower grade fevers, clear/yellow mucus. This is best treated with allergy medications or nasal sprays.  Please get on allegra for 1-2 weeks The strongest is allegra or fexafinadine  Cheapest at [employer], [employer], [employer]  2) Bacterial infections are more severe than allergies or viral infections with fever, teeth pain, fatigue. This can be treated with prednisone and the same over the counter medication and after 7 days can be treated with an antibiotic.   3) Silent reflux/GERD can cause a cough OR sore throat OR hoarseness WITHOUT heart burn because the esophagus that goes to the stomach and trachea that goes to the lungs are very close and when you lay down the acid can irritate your throat and lungs. This can cause hoarseness, cough, and wheezing. Please stop any alcohol or anti-inflammatories like aleve/advil/ibuprofen and start an over the counter Prilosec or omeprazole 1-2 times daily 44mns before food for 2 weeks, then switch to over the counter zantac/ratinidine or pepcid/famotadine once at night for 2 weeks.   4) sometimes irritation causes more irritation. Try voice rest, use sugar free cough drops to  prevent coughing, and try to stop clearing your throat.   If you ever have a cough that does not go away after trying these things please make a follow up visit for further evaluation or we can refer you to a specialist. Or if you ever have shortness of breath or chest pain go to the ER.

## 2016-06-20 NOTE — Progress Notes (Signed)
Patient ID: [name], female   DOB: 04-22-1951, 65 y.o.   MRN: [id number]   MEDICARE ANNUAL WELLNESS VISIT AND FU  Assessment:   Essential hypertension - continue medications, DASH diet, exercise and monitor at home. Call if greater than 130/80.   Hypothyroidism, unspecified type Hypothyroidism-check TSH level, continue medications the same, reminded to take on an empty stomach 30-101mns before food.   CKD stage 3 due to type 2 diabetes mellitus ([place] Discussed general issues about diabetes pathophysiology and management., Educational material distributed., Suggested low cholesterol diet., Encouraged aerobic exercise., Discussed foot care., Reminded to get yearly retinal exam.  [place] in relapse ([place] Continue follow up oncology  [place] Continue follow up oncology  Vitamin D deficiency Continue supplement  Hyperlipidemia -continue medications, check lipids, decrease fatty foods, increase activity.   Medication management  Bronchitis ? From IGA def, decreased immune system- ? 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 Contributing to cough/sickness  Edema due to malnutrition, due to unspecified malnutrition type ([place] Continue lasix, increase x 3 days  Dizziness improved  Diarrhea, unspecified type  Dehydration  Pain and swelling of left lower extremity  Weakness generalized  Non-intractable vomiting  with nausea, unspecified vomiting type   Vaccinations to be given by Dr. MJulien Nordmann  Over 40 minutes of exam, counseling, chart review and critical decision making was performed Future Appointments Date Time Provider [place] 06/25/2016 8:15 AM [facility] 1 [facility] None  06/25/2016 8:45 AM MCurt Bears MD [facility] None  06/25/2016 9:45 AM [facility] A2 [facility] None  06/26/2016 8:15 AM [facility] A2 [facility] None  07/02/2016 8:15 AM [facility] LAB 1 [facility] None  07/02/2016 9:00 AM [facility] D13 [facility] None  07/03/2016 8:15 AM [facility] A2 [facility] None  07/08/2016 8:15 AM [facility] LAB 1 [facility] None  07/08/2016 9:00 AM [facility] A3 [facility] None  07/09/2016 8:30 AM [facility] A2 [facility] None  10/18/2016 10:30 AM WUnk Pinto MD [facility] None  03/17/2017 10:00 AM CStarlyn Skeans [name] [facility] None     Plan:   During the course of the visit the patient was educated and counseled about appropriate screening and preventive services including:    Pneumococcal vaccine   Prevnar 13  Influenza vaccine  Td vaccine  Screening electrocardiogram  Bone densitometry screening  Colorectal cancer screening  Diabetes screening  Glaucoma screening  Nutrition counseling   Advanced directives: requested   Subjective:  [name] a 65y.o. AA female who presents for Medicare Annual Wellness Visit and follow up.   She has a history of multiple myeloma which is currently being treated with chemotherapy by Dr. MJulien Nordmann She has had associated fatigue, Diarrhea, and nausea due to the chemotherapy.  History of CVA with some right hand numbness for this and has occasional difficulty with ADLs like cooking and cleaning [name] rarely [place].    She has had cough/SOB since Oct, she was given levaquin and prednisone at that time. She went to  the Er 11/25 for URI as well, negative strep  test, had normal CXR 06/03/2016.  She has had low grade temps, cough, hoarseness. Cough is worse at night with lying down. She will wake up "choking" feeling that she can not breath, she will have nausea and feel like she needs to vomit. She has known IGA def and weakened immune system.   She is on decadron 4 mg, take 5 pills on Monday only. She is on lasix 49m, she is off zaroxyln. Norco once every 2 days and on MS cotin twice daily. She is on protonix.  Wt Readings from Last 3 Encounters:  06/20/16 218 lb 12.8 oz (99.2 kg)  06/10/16 216 lb (98 kg)  05/27/16 213 lb 4.8 oz (96.8 kg)    She has had elevated blood pressure for many years. Her blood pressure has been controlled at home, today their BP is BP: 128/60 She does workout secondary to her cancer and chemotherapy. She denies chest pain, shortness of breath, dizziness.   She is on cholesterol medication and denies myalgias. Her cholesterol is at goal. The cholesterol last visit was:   Lab Results  Component Value Date   CHOL 168 03/20/2016   HDL 41 (L) 03/20/2016   LDLCALC 55 03/20/2016   TRIG 361 (H) 03/20/2016   CHOLHDL 4.1 03/20/2016   She has had diabetes with chronic kidney disease which is controlled with diet and exercise. She has been working on diet and exercise for diabetes, and denies foot ulcerations, hyperglycemia, hypoglycemia , visual disturbances and weight loss. Last A1C in the office was:  Lab Results  Component Value Date   HGBA1C 5.9 (H) 03/20/2016   Patient is on Vitamin D supplement.   Lab Results  Component Value Date   VD25OH 43509/20/2017     She is on thyroid medication. Her medication was not changed last visit.   Lab Results  Component Value Date   TSH 0.44 05/29/2016  .  BMI is Body mass index is 36.41 kg/m., she is working on diet and exercise. Wt Readings from Last 3 Encounters:  06/20/16 218 lb 12.8 oz (99.2 kg)  06/10/16 216 lb (98 kg)  05/27/16 213 lb 4.8 oz (96.8 kg)    Medication  Review: Current Outpatient Prescriptions on File Prior to Visit  Medication Sig Dispense Refill  . albuterol (PROVENTIL HFA;VENTOLIN HFA) 108 (90 Base) MCG/ACT inhaler Inhale 1-2 puffs into the lungs every 4 (four) hours as needed for wheezing or shortness of breath (cough). 1 Inhaler 1  . ALPRAZolam (XANAX) 0.5 MG tablet TAKE 1/2 TO 1 TABLET 2 TO 3 TIMES DAILY AS NEEDED FOR ANXIETY 90 tablet 3  . aspirin 81 MG tablet Take 81 mg by mouth as directed. Mondays, Wednesdays, and Fridays    . brompheniramine-pseudoephedrine-DM 30-2-10 MG/5ML syrup Take 5 mLs by mouth 4 (four) times daily as needed. 473 mL 3  . citalopram (CELEXA) 40 MG tablet TAKE 1 TABLET (40 MG TOTAL) BY MOUTH DAILY. 90 tablet 2  . cyclobenzaprine (FLEXERIL) 10 MG tablet TAKE 1 TABLET 3 TIMES A DAY AS NEEDED FOR MUSCLE SPASMS 90 tablet 3  . dexamethasone (DECADRON) 4 MG tablet TAKE (10) TABLETS BY MOUTH EVERY FRIDAY 40 tablet 1  . diphenoxylate-atropine (LOMOTIL) 2.5-0.025 MG tablet TAKE 2 TABLETS BY MOUTH 4 TIMES A DAY AS NEEDED FOR DIARRHEA 30 tablet 1  . Eszopiclone 3 MG TABS TAKE 1 TABLET AT BEDTIME AS NEEDED SLEEP 30 tablet 2  . furosemide (LASIX) 40  MG tablet TAKE 1 TABLET BY MOUTH TWICE A DAY FOR FLUID AND SWELLING 180 tablet 1  . gabapentin (NEURONTIN) 600 MG tablet TAKE 1 TABLET BY MOUTH 4 TIMES A DAY AS NEEDED FOR PAIN OR CRAMPS (Patient taking differently: 1bid prn) 360 tablet 1  . HYDROcodone-acetaminophen (NORCO) 7.5-325 MG tablet Take 1 tablet by mouth every 6 (six) hours as needed for moderate pain. 45 tablet 0  . loperamide (IMODIUM) 2 MG capsule Take 2 mg by mouth as needed for diarrhea or loose stools. Reported on 10/02/2015    . Magnesium 500 MG TABS Take 500 mg by mouth 2 (two) times daily.     . metolazone (ZAROXOLYN) 2.5 MG tablet PLEASE TAKE 1 TABLET ON WEDNESDAY AND SUNDAY IN THE MORNING FOR SWELLING. 30 tablet 1  . mometasone (NASONEX) 50 MCG/ACT nasal spray PLACE 2 SPRAYS INTO THE NOSE DAILY. (Patient taking  differently: PLACE 2 SPRAYS INTO THE NOSE DAILY AS NEEDED FOR ALLERGIES.) 51 g 1  . montelukast (SINGULAIR) 10 MG tablet TAKE 1 TABLET BY MOUTH EVERY DAY 90 tablet 1  . morphine (MS CONTIN) 15 MG 12 hr tablet Take 1 tablet (15 mg total) by mouth every 12 (twelve) hours. 60 tablet 0  . ondansetron (ZOFRAN-ODT) 8 MG disintegrating tablet TAKE 1 TABLET BY MOUTH EVERY 8 HOURS AS NEEDED FOR NAUSEA AND VOMITING 30 tablet 1  . pantoprazole (PROTONIX) 40 MG tablet TAKE 1 TABLET (40 MG TOTAL) BY MOUTH 2 (TWO) TIMES DAILY. (Patient taking differently: Take 40 mg by mouth daily. ) 90 tablet 1  . potassium chloride (KLOR-CON 10) 10 MEQ tablet Take 2 tablets by mouth 2 times daily. 360 tablet 1  . predniSONE (DELTASONE) 20 MG tablet 3 tabs po daily x 3 days, then 2 tabs x 3 days, then 1.5 tabs x 3 days, then 1 tab x 3 days, then 0.5 tabs x 3 days (Patient taking differently: Take 20 mg by mouth daily. 3 tabs po daily x 3 days, then 2 tabs x 3 days, then 1.5 tabs x 3 days, then 1 tab x 3 days, then 0.5 tabs x 3 days) 27 tablet 0  . prochlorperazine (COMPAZINE) 10 MG tablet Take 1 tablet (10 mg total) by mouth every 6 (six) hours as needed for nausea or vomiting. 30 tablet 0  . SYNTHROID 100 MCG tablet TAKE 1 TABLET BY MOUTH DAILY 90 tablet 1  . traZODone (DESYREL) 150 MG tablet TAKE 1/2 TO 1 TABLET 1 HOUR BEFORE SLEEP. 90 tablet 3   Current Facility-Administered Medications on File Prior to Visit  Medication Dose Route Frequency Provider Last Rate Last Dose  . ipratropium-albuterol (DUONEB) 0.5-2.5 (3) MG/3ML nebulizer solution 3 mL  3 mL Nebulization Once [employer], [name]        Current Problems (verified) Patient Active Problem List   Diagnosis Date Noted  . IgA deficiency ([place]) 06/20/2016  . 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 Weakness generalized 02/02/2016  . Edema 02/02/2016  . Dizziness 12/29/2015  . Diarrhea 11/22/2015  . Hypomagnesemia 11/11/2015  . Bronchitis 09/24/2015  . Nausea with  vomiting 08/29/2015  . Dehydration 07/09/2015  . Lytic bone lesions on xray 06/12/2015  . Encounter for antineoplastic chemotherapy 01/16/2015  . Pain and swelling of left lower extremity 12/15/2014  . Medication management 11/04/2013  . Vitamin D deficiency 11/04/2013  . CKD stage 3 due to type 2 diabetes mellitus ([place]) 11/04/2013  . Hyperlipidemia 02/24/2013  . History of autologous stem cell transplant ([place])   .  Multiple myeloma in relapse ([place]) 01/12/2010  . Hypothyroidism 01/12/2010  . GERD 01/12/2010  . Essential hypertension 01/12/2010    Screening Tests Immunization History  Administered Date(s) Administered  . Influenza,inj,Quad PF,36+ Mos 04/01/2014, 03/23/2015, 03/25/2016  . Influenza-Unspecified 03/31/2014  . Pneumococcal-Unspecified 07/01/2008  . Tdap 07/01/2008   Preventative care: Last colonoscopy: 2013 Last mammogram: 12/2015 [id number]  Names of Other Physician/Practitioners you currently use: 1.  [facility] here for primary care 2. Dr. Venetia Maxon, eye doctor, last visit 2017 3. Dr. Burnett Harry, dentist, last visit 2017 Patient Care Team: Unk Pinto, MD as PCP - General (Internal Medicine) Curt Bears, MD as Consulting Physician (Oncology) Lelon Perla, MD as Consulting Physician (Cardiology) Wonda Horner, MD as Consulting Physician (Gastroenterology) Marybelle Killings, MD as Consulting Physician (Orthopedic Surgery) Kyung Rudd, MD as Consulting Physician (Radiation Oncology) Arnoldo Hooker (Hematology and Oncology) Laurence Spates, MD as Consulting Physician (Gastroenterology) Marybelle Killings, MD as Consulting Physician (Orthopedic Surgery) Alda Berthold, DO as Consulting Physician (Neurology) Diane Dalphine Handing (Optometry) Milus Banister, MD as Attending Physician (Gastroenterology)  Allergies No Known Allergies  SURGICAL HISTORY She  has a past surgical history that includes Knee arthroscopy (Right); Knee  arthroscopy (Right); Video bronchoscopy (07/30/2011); Cholecystectomy (1980's); Portacath placement (Right, 2009); and Vaginal hysterectomy (1980's). FAMILY HISTORY Her family history includes Breast cancer in her maternal grandmother; Colon cancer in her maternal uncle; Lung cancer in her brother. SOCIAL HISTORY She  reports that she quit smoking about 37 years ago. Her smoking use included Cigarettes. She has a 1.50 pack-year smoking history. She has never used smokeless tobacco. She reports that she does not drink alcohol or use drugs.   MEDICARE WELLNESS OBJECTIVES: Physical activity: Current Exercise Habits: The patient does not participate in regular exercise at present, Exercise limited by: Other - see comments Cardiac risk factors: Cardiac Risk Factors include: advanced age (>69mn, >>57women);dyslipidemia;hypertension;sedentary lifestyle;obesity (BMI >30kg/m2) Depression/mood screen:   [facility]  Decreased Interest 0  Down, Depressed, Hopeless 0  PHQ - 2 Score 0  Altered sleeping -  Tired, decreased energy -  Change in appetite -  Feeling bad or failure about yourself  -  Trouble concentrating -  Moving slowly or fidgety/restless -  Suicidal thoughts -  PHQ-9 Score -  Difficult doing work/chores -  Some recent data might be hidden    ADLs:  In your present state of health, do you have any difficulty performing the following activities: 06/20/2016 02/03/2016  Hearing? N N  Vision? N N  Difficulty concentrating or making decisions? N N  Walking or climbing stairs? Y N  Dressing or bathing? N N  Doing errands, shopping? Y N  Preparing Food and eating ? Y -  Using the Toilet? N -  In the past six months, have you accidently leaked urine? N -  Do you have problems with loss of bowel control? N -  Managing your Medications? N -  Managing your Finances? N -  Housekeeping or managing your Housekeeping? Y -  Some recent data might be hidden      Cognitive Testing  Alert? Yes  Normal Appearance?Yes  Oriented to person? Yes  Place? Yes   Time? Yes  Recall of three objects?  Yes  Can perform simple calculations? Yes  Displays appropriate judgment?Yes  Can read the correct time from a watch face?Yes  EOL planning: Does Patient Have a Medical Advance Directive?: No Would patient like information  on creating a medical advance directive?: Yes (ED - Information included in AVS)  ROS   Objective:     Today's Vitals   06/20/16 1141  BP: 128/60  Pulse: 83  Resp: 16  Temp: 97.5 F (36.4 C)  SpO2: 97%  Weight: 218 lb 12.8 oz (99.2 kg)  Height: 5' 5"  (1.651 m)  PainSc: 3   PainLoc: Back   Body mass index is 36.41 kg/m.  General appearance: alert, no distress, WD/WN, female HEENT: normocephalic, sclerae anicteric, TMs pearly, nares patent, no discharge or erythema, pharynx normal Oral cavity: MMM, no lesions Neck: supple, no lymphadenopathy, no thyromegaly, no masses Heart: RRR, normal S1, S2, no murmurs Lungs: CTA bilaterally, no wheezes, rhonchi, or rales Breasts:  Symmetric bilaterally without retractions, skin changes or masses.   Abdomen: +bs, well healed midline surgical incision without hernias soft, + epigastric tenderness, non distended, no masses, no hepatomegaly, no splenomegaly Musculoskeletal: nontender, no swelling, no obvious deformity.  Well healed surgical incisions on the knees Extremities: no edema, no cyanosis, no clubbing Pulses: 2+ symmetric, upper and lower extremities, normal cap refill Neurological: alert, oriented x 3, CN2-12 intact, strength normal upper extremities and lower extremities, sensation normal throughout, DTRs 2+ throughout, no cerebellar signs, gait normal Psychiatric: withdrawn affect, behavior normal, pleasant   Medicare Attestation I have personally reviewed: The patient's medical and social history Their use of alcohol, tobacco or illicit drugs Their current medications and  supplements The patient's functional ability including ADLs,fall risks, home safety risks, cognitive, and hearing and visual impairment Diet and physical activities Evidence for depression or mood disorders  The patient's weight, height, BMI, and visual acuity have been recorded in the chart.  I have made referrals, counseling, and provided education to the patient based on review of the above and I have provided the patient with a written personalized care plan for preventive services.     Vicie Mutters, [name]   06/20/2016

## 2016-06-25 ENCOUNTER — Ambulatory Visit (HOSPITAL_BASED_OUTPATIENT_CLINIC_OR_DEPARTMENT_OTHER): Payer: Medicare Other

## 2016-06-25 ENCOUNTER — Other Ambulatory Visit (HOSPITAL_BASED_OUTPATIENT_CLINIC_OR_DEPARTMENT_OTHER): Payer: Medicare Other

## 2016-06-25 ENCOUNTER — Telehealth: Payer: Self-pay | Admitting: *Deleted

## 2016-06-25 ENCOUNTER — Ambulatory Visit (HOSPITAL_BASED_OUTPATIENT_CLINIC_OR_DEPARTMENT_OTHER): Payer: Medicare Other | Admitting: Internal Medicine

## 2016-06-25 ENCOUNTER — Telehealth: Payer: Self-pay | Admitting: Internal Medicine

## 2016-06-25 ENCOUNTER — Encounter: Payer: Self-pay | Admitting: Internal Medicine

## 2016-06-25 VITALS — BP 149/79 | HR 100 | Temp 98.4°F | Resp 18 | Ht 65.0 in | Wt 215.0 lb

## 2016-06-25 DIAGNOSIS — D6481 Anemia due to antineoplastic chemotherapy: Secondary | ICD-10-CM | POA: Diagnosis not present

## 2016-06-25 DIAGNOSIS — G893 Neoplasm related pain (acute) (chronic): Secondary | ICD-10-CM

## 2016-06-25 DIAGNOSIS — T451X5A Adverse effect of antineoplastic and immunosuppressive drugs, initial encounter: Secondary | ICD-10-CM

## 2016-06-25 DIAGNOSIS — C9002 Multiple myeloma in relapse: Secondary | ICD-10-CM

## 2016-06-25 DIAGNOSIS — I10 Essential (primary) hypertension: Secondary | ICD-10-CM

## 2016-06-25 DIAGNOSIS — Z5111 Encounter for antineoplastic chemotherapy: Secondary | ICD-10-CM

## 2016-06-25 DIAGNOSIS — Z5112 Encounter for antineoplastic immunotherapy: Secondary | ICD-10-CM | POA: Diagnosis not present

## 2016-06-25 HISTORY — DX: Adverse effect of antineoplastic and immunosuppressive drugs, initial encounter: D64.81

## 2016-06-25 LAB — COMPREHENSIVE METABOLIC PANEL
ALBUMIN: 3.4 g/dL — AB (ref 3.5–5.0)
ALK PHOS: 77 U/L (ref 40–150)
ALT: 13 U/L (ref 0–55)
ANION GAP: 10 meq/L (ref 3–11)
AST: 9 U/L (ref 5–34)
BILIRUBIN TOTAL: 0.86 mg/dL (ref 0.20–1.20)
BUN: 15.5 mg/dL (ref 7.0–26.0)
CO2: 18 meq/L — AB (ref 22–29)
Calcium: 9.6 mg/dL (ref 8.4–10.4)
Chloride: 111 mEq/L — ABNORMAL HIGH (ref 98–109)
Creatinine: 0.9 mg/dL (ref 0.6–1.1)
EGFR: 78 mL/min/{1.73_m2} — AB (ref >90)
GLUCOSE: 167 mg/dL — AB (ref 70–140)
POTASSIUM: 4.5 meq/L (ref 3.5–5.1)
SODIUM: 139 meq/L (ref 136–145)
TOTAL PROTEIN: 7 g/dL (ref 6.4–8.3)

## 2016-06-25 LAB — CBC WITH DIFFERENTIAL/PLATELET
BASO%: 0.1 % (ref 0.0–2.0)
BASOS ABS: 0 10*3/uL (ref 0.0–0.1)
EOS ABS: 0 10*3/uL (ref 0.0–0.5)
EOS%: 0 % (ref 0.0–7.0)
HCT: 30 % — ABNORMAL LOW (ref 34.8–46.6)
HGB: 9.6 g/dL — ABNORMAL LOW (ref 11.6–15.9)
LYMPH%: 12.8 % — AB (ref 14.0–49.7)
MCH: 34.4 pg — AB (ref 25.1–34.0)
MCHC: 32 g/dL (ref 31.5–36.0)
MCV: 107.5 fL — ABNORMAL HIGH (ref 79.5–101.0)
MONO#: 0.6 10*3/uL (ref 0.1–0.9)
MONO%: 8.2 % (ref 0.0–14.0)
NEUT%: 78.9 % — ABNORMAL HIGH (ref 38.4–76.8)
NEUTROS ABS: 5.4 10*3/uL (ref 1.5–6.5)
PLATELETS: 133 10*3/uL — AB (ref 145–400)
RBC: 2.79 10*6/uL — AB (ref 3.70–5.45)
RDW: 15.9 % — ABNORMAL HIGH (ref 11.2–14.5)
WBC: 6.8 10*3/uL (ref 3.9–10.3)
lymph#: 0.9 10*3/uL (ref 0.9–3.3)

## 2016-06-25 MED ORDER — SODIUM CHLORIDE 0.9 % IV SOLN
Freq: Once | INTRAVENOUS | Status: AC
  Administered 2016-06-25: 10:00:00 via INTRAVENOUS

## 2016-06-25 MED ORDER — CARFILZOMIB CHEMO INJECTION 60 MG
27.0000 mg/m2 | Freq: Once | INTRAVENOUS | Status: AC
  Administered 2016-06-25: 56 mg via INTRAVENOUS
  Filled 2016-06-25: qty 28

## 2016-06-25 MED ORDER — PALONOSETRON HCL INJECTION 0.25 MG/5ML
0.2500 mg | Freq: Once | INTRAVENOUS | Status: AC
  Administered 2016-06-25: 0.25 mg via INTRAVENOUS

## 2016-06-25 MED ORDER — CYCLOPHOSPHAMIDE CHEMO INJECTION 1 GM
300.0000 mg/m2 | Freq: Once | INTRAMUSCULAR | Status: AC
  Administered 2016-06-25: 620 mg via INTRAVENOUS
  Filled 2016-06-25: qty 31

## 2016-06-25 MED ORDER — HEPARIN SOD (PORK) LOCK FLUSH 100 UNIT/ML IV SOLN
500.0000 [IU] | Freq: Once | INTRAVENOUS | Status: AC | PRN
  Administered 2016-06-25: 500 [IU]
  Filled 2016-06-25: qty 5

## 2016-06-25 MED ORDER — DEXAMETHASONE SODIUM PHOSPHATE 10 MG/ML IJ SOLN
INTRAMUSCULAR | Status: AC
  Filled 2016-06-25: qty 1

## 2016-06-25 MED ORDER — SODIUM CHLORIDE 0.9 % IV SOLN: Freq: Once | INTRAVENOUS | Status: DC

## 2016-06-25 MED ORDER — DEXAMETHASONE SODIUM PHOSPHATE 10 MG/ML IJ SOLN
10.0000 mg | Freq: Once | INTRAMUSCULAR | Status: AC
Start: 2016-06-25 — End: 2016-06-25
  Administered 2016-06-25: 10 mg via INTRAVENOUS

## 2016-06-25 MED ORDER — SODIUM CHLORIDE 0.9% FLUSH
10.0000 mL | INTRAVENOUS | Status: DC | PRN
Start: 2016-06-25 — End: 2016-06-25
  Administered 2016-06-25: 10 mL
  Filled 2016-06-25: qty 10

## 2016-06-25 MED ORDER — PALONOSETRON HCL INJECTION 0.25 MG/5ML
INTRAVENOUS | Status: AC
  Filled 2016-06-25: qty 5

## 2016-06-25 NOTE — Telephone Encounter (Signed)
Appointments scheduled per 12/26 LOS. Patient given AVS report and calendars with future scheduled appointments.

## 2016-06-25 NOTE — Telephone Encounter (Signed)
Per LOS I have scheduled appts and notified the scheduler 

## 2016-06-25 NOTE — Patient Instructions (Signed)
[facility] Discharge Instructions for Patients Receiving Chemotherapy  Today you received the following chemotherapy agents:  Kyprolis and Cytoxan.  To help prevent nausea and vomiting after your treatment, we encourage you to take your nausea medication as directed.   If you develop nausea and vomiting that is not controlled by your nausea medication, call the clinic.   BELOW ARE SYMPTOMS THAT SHOULD BE REPORTED IMMEDIATELY:  *FEVER GREATER THAN 100.5 F  *CHILLS WITH OR WITHOUT FEVER  NAUSEA AND VOMITING THAT IS NOT CONTROLLED WITH YOUR NAUSEA MEDICATION  *UNUSUAL SHORTNESS OF BREATH  *UNUSUAL BRUISING OR BLEEDING  TENDERNESS IN MOUTH AND THROAT WITH OR WITHOUT PRESENCE OF ULCERS  *URINARY PROBLEMS  *BOWEL PROBLEMS  UNUSUAL RASH Items with * indicate a potential emergency and should be followed up as soon as possible.  Feel free to call the clinic you have any questions or concerns. The clinic phone number is [phone].  Please show the CHEMO ALERT CARD at check-in to the Emergency Department and triage nurse.   

## 2016-06-25 NOTE — Addendum Note (Signed)
Addended by: Jaci Carrel A on: 06/25/2016 03:40 PM   Modules accepted: Orders

## 2016-06-25 NOTE — Progress Notes (Signed)
[place] [phone]   [phone] [place], [place] [address]  DIAGNOSIS: Recurrent multiple myeloma initially diagnosed in October 2008.  PRIOR THERAPY: 1. Status post palliative radiotherapy to the lumbar spine between L3 and L5. The patient received a total dose of 3000 cGy in 10 fractions under the care of Dr. Lisbeth Renshaw between May 05, 2007 through May 18, 2007. 2. Status post 5 cycles of systemic chemotherapy with Revlimid and low-dose Decadron with good response to this treatment. 3. Status post autologous peripheral blood stem cell transplant at [facility] on Nov 20, 2007 under the care of Dr. Valarie Merino. 4. Status post treatment for disease recurrence with Velcade, Doxil and Decadron. Last dose given Nov 09, 2009. Discontinued secondary to intolerance but the patient had a good response to treatment at that time. 5. Status post palliative radiotherapy to the T2-T6 thoracic vertebrae completed 03/21/2011 under the care of Dr. Lisbeth Renshaw. 6. Systemic chemotherapy with Velcade 1.3 mg per meter squared given on days 1, 4, 8 and 11, and Doxil at 30 mg per meter squared given on day 4 in addition to Decadron status post 4 cycles, discontinued secondary to intolerance. 7. Systemic therapy with Velcade 1.3 mg/M2 subcutaneously in addition to Decadron 40 mg by mouth on a weekly basis, status post 20 cycles. The patient had good response with this treatment but it is discontinue today secondary to worsening peripheral neuropathy. 8. Palliative radiotherapy to the skull lesion as well as the left hip area under the care of Dr. Lisbeth Renshaw. 9. Systemic chemotherapy with Carfilzomib 20 mg/M2 on days 1, 2,  8, 9, 13 and 16 every 4 weeks in addition to weekly Decadron 40 mg by mouth. First dose on 04/19/2013. Status post 4 cycles, discontinued recently secondary to cardiac  dysfunction. 10. Pomalyst 3 mg by mouth daily for 21 days every 4 weeks in addition to dexamethasone 40 mg on a weekly basis. First dose started 12/02/2013. Status post 28 cycles. Her dose of Pomalyst will be reduced to 2 mg by mouth daily for 21 days every 4 weeks starting from cycle 23, discontinued secondary to disease progression.   CURRENT THERAPY: 1) systemic chemotherapy with Carfilzomib, Cytoxan and Decadron every 4 weeks status post 3 cycles. 2) Zometa 4 mg IV every 3 months.  INTERVAL HISTORY: [name] 65 y.o. female returns to the clinic today for follow-up visit accompanied by her daughter. The patient is currently undergoing systemic chemotherapy with Carfilzomib, Cytoxan and Decadron status post 3 cycles and tolerated her treatment well. She continues to have fatigue and intermittent low back pain more to the left. She was treated recently for acute bronchitis with Levaquin and prednisone. She is currently off this treatment. She denied having any chest pain but has shortness breath with exertion was no cough or hemoptysis. She denied having any significant weight loss or night sweats. She has no nausea or vomiting, no fever or chills. She is here today for evaluation before starting cycle #4.  MEDICAL HISTORY: Past Medical History:  Diagnosis Date  . Compression fracture 04/08/2007   pathologic compression fracture  . Constipation    takes oxycontin,vicodin  . Family history of anesthesia complication    "daughter gets PONV too"  . FHx: chemotherapy    s/p 5 cycle revlimid/low dose decadron,s/p velcade,doxil,decadron,  . GERD (gastroesophageal reflux disease)   . History of autologous stem cell transplant (  [place]) 11/20/2007   [facility], Dr Valarie Merino  . Hx of radiation therapy 05/05/07-05/18/07,& 03/05/11-03/21/11-   l3&l5 in 2008, t2-t6 03/2011  . Hx of radiation therapy 05/05/2007 to 05/18/2007   palliative, L3-5  . Hx of radiation therapy 03/05/2011 to 03/21/2011   palliative  T2-T6, c-spine  . Hypercholesterolemia   . Hypothyroidism   . Insomnia    associated with steroids  . Metastasis to bone ([place])   . Multiple myeloma ([place]) dx'd 2009  . Pneumonia    "several times"  . PONV (postoperative nausea and vomiting)   . Stroke [facility]) 2014   denies residual on 01/27/2014    ALLERGIES:  has No Known Allergies.  MEDICATIONS:  Current Outpatient Prescriptions  Medication Sig Dispense Refill  . albuterol (PROVENTIL HFA;VENTOLIN HFA) 108 (90 Base) MCG/ACT inhaler Inhale 1-2 puffs into the lungs every 4 (four) hours as needed for wheezing or shortness of breath (cough). 1 Inhaler 1  . ALPRAZolam (XANAX) 0.5 MG tablet TAKE 1/2 TO 1 TABLET 2 TO 3 TIMES DAILY AS NEEDED FOR ANXIETY 90 tablet 3  . aspirin 81 MG tablet Take 81 mg by mouth as directed. Mondays, Wednesdays, and Fridays    . brompheniramine-pseudoephedrine-DM 30-2-10 MG/5ML syrup Take 5 mLs by mouth 4 (four) times daily as needed. 473 mL 3  . citalopram (CELEXA) 40 MG tablet TAKE 1 TABLET (40 MG TOTAL) BY MOUTH DAILY. 90 tablet 2  . cyclobenzaprine (FLEXERIL) 10 MG tablet TAKE 1 TABLET 3 TIMES A DAY AS NEEDED FOR MUSCLE SPASMS 90 tablet 3  . dexamethasone (DECADRON) 4 MG tablet TAKE (10) TABLETS BY MOUTH EVERY FRIDAY 40 tablet 1  . diphenoxylate-atropine (LOMOTIL) 2.5-0.025 MG tablet TAKE 2 TABLETS BY MOUTH 4 TIMES A DAY AS NEEDED FOR DIARRHEA 30 tablet 1  . Eszopiclone 3 MG TABS TAKE 1 TABLET AT BEDTIME AS NEEDED SLEEP 30 tablet 2  . fluticasone furoate-vilanterol (BREO ELLIPTA) 100-25 MCG/INH AEPB Inhale 1 puff into the lungs daily. Rinse mouth with water after each use 1 each 4  . furosemide (LASIX) 40 MG tablet TAKE 1 TABLET BY MOUTH TWICE A DAY FOR FLUID AND SWELLING 180 tablet 1  . gabapentin (NEURONTIN) 600 MG tablet TAKE 1 TABLET BY MOUTH 4 TIMES A DAY AS NEEDED FOR PAIN OR CRAMPS (Patient taking differently: 1bid prn) 360 tablet 1  . HYDROcodone-acetaminophen (NORCO) 7.5-325 MG tablet Take 1 tablet by  mouth every 6 (six) hours as needed for moderate pain. 45 tablet 0  . loperamide (IMODIUM) 2 MG capsule Take 2 mg by mouth as needed for diarrhea or loose stools. Reported on 10/02/2015    . Magnesium 500 MG TABS Take 500 mg by mouth 2 (two) times daily.     . metolazone (ZAROXOLYN) 2.5 MG tablet PLEASE TAKE 1 TABLET ON WEDNESDAY AND SUNDAY IN THE MORNING FOR SWELLING. 30 tablet 1  . mometasone (NASONEX) 50 MCG/ACT nasal spray PLACE 2 SPRAYS INTO THE NOSE DAILY. (Patient taking differently: PLACE 2 SPRAYS INTO THE NOSE DAILY AS NEEDED FOR ALLERGIES.) 51 g 1  . montelukast (SINGULAIR) 10 MG tablet TAKE 1 TABLET BY MOUTH EVERY DAY 90 tablet 1  . morphine (MS CONTIN) 15 MG 12 hr tablet Take 1 tablet (15 mg total) by mouth every 12 (twelve) hours. 60 tablet 0  . ondansetron (ZOFRAN-ODT) 8 MG disintegrating tablet TAKE 1 TABLET BY MOUTH EVERY 8 HOURS AS NEEDED FOR NAUSEA AND VOMITING 30 tablet 1  . pantoprazole (PROTONIX) 40 MG tablet TAKE 1 TABLET (40  MG TOTAL) BY MOUTH 2 (TWO) TIMES DAILY. (Patient taking differently: Take 40 mg by mouth daily. ) 90 tablet 1  . potassium chloride (KLOR-CON 10) 10 MEQ tablet Take 2 tablets by mouth 2 times daily. 360 tablet 1  . predniSONE (DELTASONE) 20 MG tablet 3 tabs po daily x 3 days, then 2 tabs x 3 days, then 1.5 tabs x 3 days, then 1 tab x 3 days, then 0.5 tabs x 3 days (Patient taking differently: Take 20 mg by mouth daily. 3 tabs po daily x 3 days, then 2 tabs x 3 days, then 1.5 tabs x 3 days, then 1 tab x 3 days, then 0.5 tabs x 3 days) 27 tablet 0  . prochlorperazine (COMPAZINE) 10 MG tablet Take 1 tablet (10 mg total) by mouth every 6 (six) hours as needed for nausea or vomiting. 30 tablet 0  . SYNTHROID 100 MCG tablet TAKE 1 TABLET BY MOUTH DAILY 90 tablet 1  . traZODone (DESYREL) 150 MG tablet TAKE 1/2 TO 1 TABLET 1 HOUR BEFORE SLEEP. 90 tablet 3   Current Facility-Administered Medications  Medication Dose Route Frequency Provider Last Rate Last Dose  .  ipratropium-albuterol (DUONEB) 0.5-2.5 (3) MG/3ML nebulizer solution 3 mL  3 mL Nebulization Once Starlyn Skeans, [name]        SURGICAL HISTORY:  Past Surgical History:  Procedure Laterality Date  . CHOLECYSTECTOMY  1980's  . KNEE ARTHROSCOPY Right    "put pin in"  . KNEE ARTHROSCOPY Right    "took pin out and corrected what was wrong"  . PORTACATH PLACEMENT Right 2009  . VAGINAL HYSTERECTOMY  1980's  . VIDEO BRONCHOSCOPY  07/30/2011   Procedure: VIDEO BRONCHOSCOPY WITHOUT FLUORO;  Surgeon: Kathee Delton, MD;  Location: [facility];  Service: Cardiopulmonary;  Laterality: Bilateral;    REVIEW OF SYSTEMS:  Constitutional: positive for fatigue Eyes: negative Ears, nose, mouth, throat, and face: negative Respiratory: positive for dyspnea on exertion Cardiovascular: negative Gastrointestinal: negative Genitourinary:negative Integument/breast: negative Hematologic/lymphatic: negative Musculoskeletal:positive for back pain Neurological: negative Behavioral/Psych: negative Endocrine: negative Allergic/Immunologic: negative   PHYSICAL EXAMINATION: General appearance: alert, cooperative, fatigued and no distress Head: Normocephalic, without obvious abnormality, atraumatic Neck: no adenopathy, no JVD, supple, symmetrical, trachea midline and thyroid not enlarged, symmetric, no tenderness/mass/nodules Lymph nodes: Cervical, supraclavicular, and axillary nodes normal. Resp: clear to auscultation bilaterally Back: symmetric, no curvature. ROM normal. No CVA tenderness. Cardio: regular rate and rhythm, S1, S2 normal, no murmur, click, rub or gallop GI: soft, non-tender; bowel sounds normal; no masses,  no organomegaly Extremities: extremities normal, atraumatic, no cyanosis or edema Neurologic: Alert and oriented X 3, normal strength and tone. Normal symmetric reflexes. Normal coordination and gait  ECOG PERFORMANCE STATUS: 1 - Symptomatic but completely ambulatory  Blood pressure  (!) 149/79, pulse 100, temperature 98.4 F (36.9 C), temperature source Oral, resp. rate 18, height 5' 5"  (1.651 m), weight 215 lb (97.5 kg), SpO2 100 %.  LABORATORY DATA: Lab Results  Component Value Date   WBC 6.8 06/25/2016   HGB 9.6 (L) 06/25/2016   HCT 30.0 (L) 06/25/2016   MCV 107.5 (H) 06/25/2016   PLT 133 (L) 06/25/2016      Chemistry      Component Value Date/Time   NA 140 06/10/2016 0900   K 3.8 06/10/2016 0900   CL 107 03/20/2016 1001   CL 105 12/17/2012 1015   CO2 22 06/10/2016 0900   BUN 8.6 06/10/2016 0900   CREATININE 1.1 06/10/2016 0900  Component Value Date/Time   CALCIUM 8.9 06/10/2016 0900   ALKPHOS 96 06/10/2016 0900   AST 10 06/10/2016 0900   ALT 12 06/10/2016 0900   BILITOT 0.79 06/10/2016 0900       RADIOGRAPHIC STUDIES: Dg Chest 2 View  Result Date: 06/03/2016 CLINICAL DATA:  Multiple myeloma. Productive cough, congestion, and shortness of breath. Former smoker. EXAM: CHEST  2 VIEW COMPARISON:  03/20/2016 FINDINGS: Right jugular Port-A-Cath terminates over the lower SVC, unchanged. Cardiomediastinal silhouette is within normal limits. The patient has taken a shallower inspiration than on the prior study, particularly on the lateral radiographs. No confluent airspace opacity, edema, pleural effusion, or pneumothorax is identified. No acute osseous abnormality is seen. IMPRESSION: No active cardiopulmonary disease. Electronically Signed   By: Logan Bores M.D.   On: 06/03/2016 10:40    ASSESSMENT AND PLAN: This is a very pleasant 65 years old African-American female with: 1) relapsed multiple myeloma status post multiple systemic chemotherapy regimens and currently on treatment with Carfilzomib, Cytoxan and Decadron status post 3 cycles. She is tolerating the treatment well. I recommended for the patient to proceed with cycle #4 today as a scheduled. 2) hypertension: The patient was advised to take her blood pressure medication as prescribed. She  will check her blood pressure regularly at home and report to her primary care physician if not well controlled. 3) chemotherapy-induced anemia : Her hemoglobin and hematocrit are low but stable. We'll continue to monitor this closely and consider the patient for transfusion if needed.  The patient would come back for follow-up visit in 4 weeks for evaluation before starting cycle #5. She was advised to call immediately if she has any concerning symptoms in the interval. The patient voices understanding of current disease status and treatment options and is in agreement with the current care plan.  All questions were answered. The patient knows to call the clinic with any problems, questions or concerns. We can certainly see the patient much sooner if necessary.  Disclaimer: This note was dictated with voice recognition software. Similar sounding words can inadvertently be transcribed and may not be corrected upon review.

## 2016-06-26 ENCOUNTER — Other Ambulatory Visit: Payer: Self-pay | Admitting: Internal Medicine

## 2016-06-26 ENCOUNTER — Ambulatory Visit (HOSPITAL_BASED_OUTPATIENT_CLINIC_OR_DEPARTMENT_OTHER): Payer: Medicare Other

## 2016-06-26 VITALS — BP 134/76 | HR 99 | Temp 98.9°F | Resp 20

## 2016-06-26 DIAGNOSIS — Z5112 Encounter for antineoplastic immunotherapy: Secondary | ICD-10-CM

## 2016-06-26 DIAGNOSIS — C9002 Multiple myeloma in relapse: Secondary | ICD-10-CM

## 2016-06-26 MED ORDER — CARFILZOMIB CHEMO INJECTION 60 MG
27.0000 mg/m2 | Freq: Once | INTRAVENOUS | Status: AC
  Administered 2016-06-26: 56 mg via INTRAVENOUS
  Filled 2016-06-26: qty 28

## 2016-06-26 MED ORDER — SODIUM CHLORIDE 0.9% FLUSH
10.0000 mL | INTRAVENOUS | Status: DC | PRN
  Administered 2016-06-26: 10 mL
  Filled 2016-06-26: qty 10

## 2016-06-26 MED ORDER — SODIUM CHLORIDE 0.9 % IV SOLN
Freq: Once | INTRAVENOUS | Status: AC
  Administered 2016-06-26: 10:00:00 via INTRAVENOUS

## 2016-06-26 MED ORDER — DEXAMETHASONE SODIUM PHOSPHATE 10 MG/ML IJ SOLN
10.0000 mg | Freq: Once | INTRAMUSCULAR | Status: AC
  Administered 2016-06-26: 10 mg via INTRAVENOUS

## 2016-06-26 MED ORDER — HEPARIN SOD (PORK) LOCK FLUSH 100 UNIT/ML IV SOLN
500.0000 [IU] | Freq: Once | INTRAVENOUS | Status: AC | PRN
  Administered 2016-06-26: 500 [IU]
  Filled 2016-06-26: qty 5

## 2016-06-26 MED ORDER — DEXAMETHASONE SODIUM PHOSPHATE 10 MG/ML IJ SOLN
INTRAMUSCULAR | Status: AC
  Filled 2016-06-26: qty 1

## 2016-06-26 NOTE — Patient Instructions (Signed)
[facility] Discharge Instructions for Patients Receiving Chemotherapy  Today you received the following chemotherapy agents: Kyprolis   To help prevent nausea and vomiting after your treatment, we encourage you to take your nausea medication as directed.    If you develop nausea and vomiting that is not controlled by your nausea medication, call the clinic.   BELOW ARE SYMPTOMS THAT SHOULD BE REPORTED IMMEDIATELY:  *FEVER GREATER THAN 100.5 F  *CHILLS WITH OR WITHOUT FEVER  NAUSEA AND VOMITING THAT IS NOT CONTROLLED WITH YOUR NAUSEA MEDICATION  *UNUSUAL SHORTNESS OF BREATH  *UNUSUAL BRUISING OR BLEEDING  TENDERNESS IN MOUTH AND THROAT WITH OR WITHOUT PRESENCE OF ULCERS  *URINARY PROBLEMS  *BOWEL PROBLEMS  UNUSUAL RASH Items with * indicate a potential emergency and should be followed up as soon as possible.  Feel free to call the clinic you have any questions or concerns. The clinic phone number is [phone].  Please show the CHEMO ALERT CARD at check-in to the Emergency Department and triage nurse.   

## 2016-06-29 ENCOUNTER — Encounter (HOSPITAL_COMMUNITY): Payer: Self-pay | Admitting: Emergency Medicine

## 2016-06-29 ENCOUNTER — Emergency Department (HOSPITAL_COMMUNITY)
Admission: EM | Admit: 2016-06-29 | Discharge: 2016-06-30 | Disposition: A | Discharge status: Home / Self Care | Payer: Medicare Other | Attending: Emergency Medicine | Admitting: Emergency Medicine

## 2016-06-29 DIAGNOSIS — E039 Hypothyroidism, unspecified: Secondary | ICD-10-CM | POA: Diagnosis not present

## 2016-06-29 DIAGNOSIS — N183 Chronic kidney disease, stage 3 (moderate): Secondary | ICD-10-CM | POA: Insufficient documentation

## 2016-06-29 DIAGNOSIS — Z79899 Other long term (current) drug therapy: Secondary | ICD-10-CM | POA: Diagnosis not present

## 2016-06-29 DIAGNOSIS — E1122 Type 2 diabetes mellitus with diabetic chronic kidney disease: Secondary | ICD-10-CM | POA: Diagnosis not present

## 2016-06-29 DIAGNOSIS — Z87891 Personal history of nicotine dependence: Secondary | ICD-10-CM | POA: Diagnosis not present

## 2016-06-29 DIAGNOSIS — I129 Hypertensive chronic kidney disease with stage 1 through stage 4 chronic kidney disease, or unspecified chronic kidney disease: Secondary | ICD-10-CM | POA: Diagnosis not present

## 2016-06-29 DIAGNOSIS — Z8673 Personal history of transient ischemic attack (TIA), and cerebral infarction without residual deficits: Secondary | ICD-10-CM | POA: Insufficient documentation

## 2016-06-29 DIAGNOSIS — C9000 Multiple myeloma not having achieved remission: Secondary | ICD-10-CM | POA: Diagnosis not present

## 2016-06-29 DIAGNOSIS — R197 Diarrhea, unspecified: Secondary | ICD-10-CM

## 2016-06-29 DIAGNOSIS — Z7982 Long term (current) use of aspirin: Secondary | ICD-10-CM | POA: Insufficient documentation

## 2016-06-29 LAB — URINALYSIS, ROUTINE W REFLEX MICROSCOPIC
Bacteria, UA: NONE SEEN
Bilirubin Urine: NEGATIVE
Glucose, UA: NEGATIVE mg/dL
KETONES UR: NEGATIVE mg/dL
Nitrite: NEGATIVE
PH: 5 (ref 5.0–8.0)
Protein, ur: 30 mg/dL — AB
Specific Gravity, Urine: 1.028 (ref 1.005–1.030)
Squamous Epithelial / LPF: NONE SEEN

## 2016-06-29 LAB — COMPREHENSIVE METABOLIC PANEL
ALBUMIN: 3.8 g/dL (ref 3.5–5.0)
ALK PHOS: 57 U/L (ref 38–126)
ALT: 13 U/L — AB (ref 14–54)
AST: 14 U/L — ABNORMAL LOW (ref 15–41)
Anion gap: 9 (ref 5–15)
BUN: 11 mg/dL (ref 6–20)
CALCIUM: 9.6 mg/dL (ref 8.9–10.3)
CO2: 24 mmol/L (ref 22–32)
CREATININE: 0.76 mg/dL (ref 0.44–1.00)
Chloride: 103 mmol/L (ref 101–111)
GFR calc Af Amer: >60 mL/min (ref >60)
GFR calc non Af Amer: >60 mL/min (ref >60)
GLUCOSE: 106 mg/dL — AB (ref 65–99)
Potassium: 3.9 mmol/L (ref 3.5–5.1)
SODIUM: 136 mmol/L (ref 135–145)
Total Bilirubin: 1 mg/dL (ref 0.3–1.2)
Total Protein: 7 g/dL (ref 6.5–8.1)

## 2016-06-29 LAB — CBC
HCT: 33.5 % — ABNORMAL LOW (ref 36.0–46.0)
HEMOGLOBIN: 11.1 g/dL — AB (ref 12.0–15.0)
MCH: 35.4 pg — AB (ref 26.0–34.0)
MCHC: 33.1 g/dL (ref 30.0–36.0)
MCV: 106.7 fL — ABNORMAL HIGH (ref 78.0–100.0)
PLATELETS: 132 10*3/uL — AB (ref 150–400)
RBC: 3.14 MIL/uL — ABNORMAL LOW (ref 3.87–5.11)
RDW: 15.3 % (ref 11.5–15.5)
WBC: 5.2 10*3/uL (ref 4.0–10.5)

## 2016-06-29 LAB — LIPASE, BLOOD: Lipase: 21 U/L (ref 11–51)

## 2016-06-29 NOTE — ED Notes (Signed)
Pt unable to urinate at this time.  

## 2016-06-29 NOTE — ED Notes (Signed)
Bed: WA07 Expected date:  Expected time:  Means of arrival:  Comments: Tri -3

## 2016-06-29 NOTE — ED Triage Notes (Signed)
Cancer pt here reports chemo treatment on Wednesday and having diarrhea, lack of appetite and faitgue since then.

## 2016-06-29 NOTE — ED Notes (Signed)
Stool sample collection unsuccessful

## 2016-06-29 NOTE — ED Notes (Signed)
MD at bedside. 

## 2016-06-30 NOTE — ED Notes (Signed)
Reviewed instructions on stool specimen with pt prior to d/c

## 2016-06-30 NOTE — ED Provider Notes (Signed)
[place] DEPT Provider Note   CSN: [id number] Arrival date & time: 06/29/16  1935     History   Chief Complaint Chief Complaint  Patient presents with  . Diarrhea  . Fatigue    HPI [name] is a 65 y.o. female.  HPI Patient has history of recurrent multiple myeloma. She is currently taking systemic chemotherapy with Carfilzomid, Cytoxan and decadron. Patient denies frequent antibiotic prescriptions. She started having diarrhea about 3 days ago. No blood in the stool. Sometimes 4-5 bowel movements per day. Associated abdominal pain, fever or vomiting. No history of C. difficile. Past Medical History:  Diagnosis Date  . Antineoplastic chemotherapy induced anemia 06/25/2016  . Compression fracture 04/08/2007   pathologic compression fracture  . Constipation    takes oxycontin,vicodin  . Family history of anesthesia complication    "daughter gets PONV too"  . FHx: chemotherapy    s/p 5 cycle revlimid/low dose decadron,s/p velcade,doxil,decadron,  . GERD (gastroesophageal reflux disease)   . 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 Hx of radiation therapy 05/05/07-05/18/07,& 03/05/11-03/21/11-   l3&l5 in 2008, t2-t6 03/2011  . Hx of radiation therapy 05/05/2007 to 05/18/2007   palliative, L3-5  . Hx of radiation therapy 03/05/2011 to 03/21/2011   palliative T2-T6, c-spine  . Hypercholesterolemia   . Hypothyroidism   . Insomnia    associated with steroids  . Metastasis to bone ([place])   . Multiple myeloma ([place]) dx'd 2009  . Pneumonia    "several times"  . PONV (postoperative nausea and vomiting)   . Stroke [facility]) 2014   denies residual on 01/27/2014    Patient Active Problem List   Diagnosis Date Noted  . Antineoplastic chemotherapy induced anemia 06/25/2016  . 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 Weakness generalized 02/02/2016  . Edema 02/02/2016  . Dizziness 12/29/2015  . Diarrhea  11/22/2015  . Hypomagnesemia 11/11/2015  . Bronchitis 09/24/2015  . Nausea with vomiting 08/29/2015  . Dehydration 07/09/2015  . Lytic bone lesions on xray 06/12/2015  . Encounter for antineoplastic chemotherapy 01/16/2015  . Pain and swelling of left lower extremity 12/15/2014  . Medication management 11/04/2013  . Vitamin D deficiency 11/04/2013  . 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 Hyperlipidemia 02/24/2013  . History of autologous stem cell transplant ([place])   . Multiple myeloma in relapse ([place]) 01/12/2010  . Hypothyroidism 01/12/2010  . GERD 01/12/2010  . Essential hypertension 01/12/2010    Past Surgical History:  Procedure Laterality Date  . CHOLECYSTECTOMY  1980's  . KNEE ARTHROSCOPY Right    "put pin in"  . KNEE ARTHROSCOPY Right    "took pin out and corrected what was wrong"  . PORTACATH PLACEMENT Right 2009  . VAGINAL HYSTERECTOMY  1980's  . VIDEO BRONCHOSCOPY  07/30/2011   Procedure: VIDEO BRONCHOSCOPY WITHOUT FLUORO;  Surgeon: Kathee Delton, MD;  Location: [facility] ENDOSCOPY;  Service: Cardiopulmonary;  Laterality: Bilateral;    OB History    No data available       Home Medications    Prior to Admission medications   Medication Sig Start Date End Date Taking? Authorizing Provider  albuterol (PROVENTIL HFA;VENTOLIN HFA) 108 (90 Base) MCG/ACT inhaler Inhale 1-2 puffs into the lungs every 4 (four) hours as needed for wheezing or shortness of breath (cough). 06/03/16  Yes Susanne Borders, NP  ALPRAZolam Duanne Moron) 0.5 MG tablet TAKE 1/2 TO  1 TABLET 2 TO 3 TIMES DAILY AS NEEDED FOR ANXIETY Patient taking differently: TAKE 0.25-0.5 MG BY MOUTH 2-3 TIMES DAILY AS NEEDED FOR ANXIETY 05/20/16  Yes Unk Pinto, MD  aspirin 81 MG tablet Take 81 mg by mouth 3 (three) times a week. Mondays, Wednesdays, and Fridays   Yes Historical Provider, MD  brompheniramine-pseudoephedrine-DM 30-2-10 MG/5ML syrup Take 5 mLs by mouth 4 (four) times daily as  needed. Patient taking differently: Take 5 mLs by mouth 4 (four) times daily as needed (COUGH, CONGESTION).  06/10/16  Yes Maryanna Shape, NP  citalopram (CELEXA) 40 MG tablet TAKE 1 TABLET (40 MG TOTAL) BY MOUTH DAILY. 12/25/15  Yes Unk Pinto, MD  cyclobenzaprine (FLEXERIL) 10 MG tablet TAKE 1 TABLET 3 TIMES A DAY AS NEEDED FOR MUSCLE SPASMS Patient taking differently: TAKE 10 MG 3 TIMES A DAY AS NEEDED FOR MUSCLE SPASMS 03/22/16  Yes Vicie Mutters, [name]  dexamethasone (DECADRON) 4 MG tablet TAKE (10) TABLETS BY MOUTH EVERY FRIDAY Patient taking differently: TAKE 20 MG BY MOUTH EVERY MONDAY 05/25/16  Yes Curt Bears, MD  diphenoxylate-atropine (LOMOTIL) 2.5-0.025 MG tablet TAKE 2 TABLETS BY MOUTH 4 TIMES A DAY AS NEEDED FOR DIARRHEA Patient taking differently: Take 2 tablets by mouth 4 (four) times daily as needed for diarrhea or loose stools.  09/21/15  Yes Susanne Borders, NP  Eszopiclone 3 MG TABS TAKE 1 TABLET AT BEDTIME AS NEEDED SLEEP Patient taking differently: Take 3 mg by mouth at bedtime as needed (SLEEP).  04/18/16  Yes Courtney Forcucci, [name]  fluticasone furoate-vilanterol (BREO ELLIPTA) 100-25 MCG/INH AEPB Inhale 1 puff into the lungs daily. Rinse mouth with water after each use 06/20/16  Yes Vicie Mutters, [name]  furosemide (LASIX) 40 MG tablet TAKE 1 TABLET BY MOUTH TWICE A DAY FOR FLUID AND SWELLING Patient taking differently: TAKE 1 TABLET BY MOUTH Tdaily 05/26/16  Yes Unk Pinto, MD  gabapentin (NEURONTIN) 600 MG tablet TAKE 1 TABLET BY MOUTH 4 TIMES A DAY AS NEEDED FOR PAIN OR CRAMPS Patient taking differently: 1bid prn 05/22/16  Yes Unk Pinto, MD  HYDROcodone-acetaminophen (NORCO) 7.5-325 MG tablet Take 1 tablet by mouth every 6 (six) hours as needed for moderate pain. 05/27/16  Yes Curt Bears, MD  loperamide (IMODIUM) 2 MG capsule Take 2 mg by mouth as needed for diarrhea or loose stools. Reported on 10/02/2015   Yes Historical Provider, MD  Magnesium  500 MG TABS Take 500 mg by mouth 2 (two) times daily.    Yes Historical Provider, MD  mometasone (NASONEX) 50 MCG/ACT nasal spray PLACE 2 SPRAYS INTO THE NOSE DAILY. Patient taking differently: PLACE 2 SPRAYS INTO THE NOSE DAILY AS NEEDED FOR ALLERGIES. 01/08/16  Yes Unk Pinto, MD  montelukast (SINGULAIR) 10 MG tablet TAKE 1 TABLET BY MOUTH EVERY DAY Patient taking differently: TAKE 10 MG BY MOUTH DAILY 02/24/16  Yes Unk Pinto, MD  morphine (MS CONTIN) 15 MG 12 hr tablet Take 1 tablet (15 mg total) by mouth every 12 (twelve) hours. 06/10/16  Yes Maryanna Shape, NP  ondansetron (ZOFRAN-ODT) 8 MG disintegrating tablet TAKE 1 TABLET BY MOUTH EVERY 8 HOURS AS NEEDED FOR NAUSEA AND VOMITING Patient taking differently: TAKE  8 MG BY MOUTH EVERY 8 HOURS AS NEEDED FOR NAUSEA AND VOMITING 12/26/15  Yes Unk Pinto, MD  pantoprazole (PROTONIX) 40 MG tablet TAKE 1 TABLET (40 MG TOTAL) BY MOUTH 2 (TWO) TIMES DAILY. Patient taking differently: Take 40 mg by mouth 2 (two) times daily.  01/04/16  Yes Vicie Mutters, [name]  potassium chloride (KLOR-CON 10) 10 MEQ tablet Take 2 tablets by mouth 2 times daily. Patient taking differently: Take 20 mEq by mouth 2 (two) times daily. Take 2 tablets by mouth 2 times daily. 04/03/16  Yes Unk Pinto, MD  prochlorperazine (COMPAZINE) 10 MG tablet Take 1 tablet (10 mg total) by mouth every 6 (six) hours as needed for nausea or vomiting. 03/25/16  Yes Curt Bears, MD  SYNTHROID 100 MCG tablet TAKE 1 TABLET BY MOUTH DAILY Patient taking differently: TAKE 100 MCG BY MOUTH DAILY 02/17/16  Yes Unk Pinto, MD  metolazone (ZAROXOLYN) 2.5 MG tablet PLEASE TAKE 1 TABLET ON WEDNESDAY AND SUNDAY IN THE MORNING FOR SWELLING. Patient not taking: Reported on 06/29/2016 05/22/16   Unk Pinto, MD    Family History Family History  Problem Relation Age of Onset  . Lung cancer Brother   . Colon cancer Maternal Uncle   . Breast cancer Maternal Grandmother      Social History Social History  Substance Use Topics  . Smoking status: Former Smoker    Packs/day: 0.25    Years: 6.00    Types: Cigarettes    Quit date: 08/27/1978  . Smokeless tobacco: Never Used  . Alcohol use No     Comment: "quit drinking in the 1980's", previously drank on the weekend     Allergies   Patient has no known allergies.   Review of Systems Review of Systems 10 Systems reviewed and are negative for acute change except as noted in the HPI.   Physical Exam Updated Vital Signs BP 136/77 (BP Location: Left Arm)   Pulse 74   Temp 98.7 F (37.1 C) (Oral)   Resp 18   Ht 5' 5"  (1.651 m)   Wt 215 lb (97.5 kg)   SpO2 98%   BMI 35.78 kg/m   Physical Exam  Constitutional: She appears well-developed and well-nourished. No distress.  HENT:  Head: Normocephalic and atraumatic.  Eyes: Conjunctivae are normal.  Neck: Neck supple.  Cardiovascular: Normal rate and regular rhythm.   No murmur heard. Pulmonary/Chest: Effort normal and breath sounds normal. No respiratory distress.  Abdominal: Soft. There is no tenderness.  Musculoskeletal: She exhibits no edema.  Neurological: She is alert.  Skin: Skin is warm and dry.  Psychiatric: She has a normal mood and affect.  Nursing note and vitals reviewed.    ED Treatments / Results  Labs (all labs ordered are listed, but only abnormal results are displayed) Labs Reviewed  COMPREHENSIVE METABOLIC PANEL - Abnormal; Notable for the following:       Result Value   Glucose, Bld 106 (*)    AST 14 (*)    ALT 13 (*)    All other components within normal limits  CBC - Abnormal; Notable for the following:    RBC 3.14 (*)    Hemoglobin 11.1 (*)    HCT 33.5 (*)    MCV 106.7 (*)    MCH 35.4 (*)    Platelets 132 (*)    All other components within normal limits  URINALYSIS, ROUTINE W REFLEX MICROSCOPIC - Abnormal; Notable for the following:    Hgb urine dipstick SMALL (*)    Protein, ur 30 (*)    Leukocytes, UA  TRACE (*)    All other components within normal limits  GASTROINTESTINAL PANEL BY PCR, STOOL (REPLACES STOOL CULTURE)  C DIFFICILE QUICK SCREEN W PCR REFLEX  LIPASE, BLOOD    EKG  EKG Interpretation None       Radiology No results found.  Procedures Procedures (including critical care time)  Medications Ordered in ED Medications - No data to display   Initial Impression / Assessment and Plan / ED Course  I have reviewed the triage vital signs and the nursing notes.  Pertinent labs & imaging results that were available during my care of the patient were reviewed by me and considered in my medical decision making (see chart for details).  Clinical Course       Final Clinical Impressions(s) / ED Diagnoses   Final diagnoses:  Diarrhea, unspecified type  Multiple myeloma not having achieved remission [facility])   Patient reports several days of diarrheal illness. Clinically patient is well in appearance. She is alert and nontoxic. No abdominal tenderness to palpation. Vital signs are stable. Lab work is normal. No evidence of dehydration by diagnostic study or clinical appearance. No leukocytosis, fever or abdominal pain. At this time, do not have high suspicion for C. difficile. I do however feel stool testing is ordered. Patient was unable to provide a specimen in the emergency department. Orders and specimen collection kit are provided. Patient will have close follow-up with oncology and PCP. Patient is counseled on signs and symptoms for which she would need to return. New Prescriptions New Prescriptions   No medications on file     Charlesetta Shanks, MD 06/30/16 [phone]

## 2016-07-02 ENCOUNTER — Other Ambulatory Visit (HOSPITAL_BASED_OUTPATIENT_CLINIC_OR_DEPARTMENT_OTHER): Payer: Medicare Other

## 2016-07-02 ENCOUNTER — Telehealth: Payer: Self-pay | Admitting: Medical Oncology

## 2016-07-02 ENCOUNTER — Ambulatory Visit (HOSPITAL_BASED_OUTPATIENT_CLINIC_OR_DEPARTMENT_OTHER): Payer: Medicare Other

## 2016-07-02 VITALS — BP 148/79 | HR 93 | Temp 99.0°F | Resp 16

## 2016-07-02 DIAGNOSIS — C9002 Multiple myeloma in relapse: Secondary | ICD-10-CM

## 2016-07-02 DIAGNOSIS — Z5111 Encounter for antineoplastic chemotherapy: Secondary | ICD-10-CM | POA: Diagnosis not present

## 2016-07-02 DIAGNOSIS — Z5112 Encounter for antineoplastic immunotherapy: Secondary | ICD-10-CM

## 2016-07-02 LAB — CBC WITH DIFFERENTIAL/PLATELET
BASO%: 0 % (ref 0.0–2.0)
BASOS ABS: 0 10*3/uL (ref 0.0–0.1)
EOS ABS: 0 10*3/uL (ref 0.0–0.5)
EOS%: 0.6 % (ref 0.0–7.0)
HEMATOCRIT: 32 % — AB (ref 34.8–46.6)
HGB: 9.9 g/dL — ABNORMAL LOW (ref 11.6–15.9)
LYMPH%: 13 % — ABNORMAL LOW (ref 14.0–49.7)
MCH: 33.6 pg (ref 25.1–34.0)
MCHC: 30.9 g/dL — ABNORMAL LOW (ref 31.5–36.0)
MCV: 108.5 fL — AB (ref 79.5–101.0)
MONO#: 0.2 10*3/uL (ref 0.1–0.9)
MONO%: 4.5 % (ref 0.0–14.0)
NEUT%: 81.9 % — ABNORMAL HIGH (ref 38.4–76.8)
NEUTROS ABS: 4.2 10*3/uL (ref 1.5–6.5)
Platelets: 102 10*3/uL — ABNORMAL LOW (ref 145–400)
RBC: 2.95 10*6/uL — ABNORMAL LOW (ref 3.70–5.45)
RDW: 15.6 % — AB (ref 11.2–14.5)
WBC: 5.1 10*3/uL (ref 3.9–10.3)
lymph#: 0.7 10*3/uL — ABNORMAL LOW (ref 0.9–3.3)

## 2016-07-02 LAB — COMPREHENSIVE METABOLIC PANEL
ALBUMIN: 3.3 g/dL — AB (ref 3.5–5.0)
ALK PHOS: 80 U/L (ref 40–150)
ALT: 15 U/L (ref 0–55)
AST: 17 U/L (ref 5–34)
Anion Gap: 11 mEq/L (ref 3–11)
BUN: 13.9 mg/dL (ref 7.0–26.0)
CALCIUM: 8.9 mg/dL (ref 8.4–10.4)
CO2: 21 mEq/L — ABNORMAL LOW (ref 22–29)
Chloride: 106 mEq/L (ref 98–109)
Creatinine: 1 mg/dL (ref 0.6–1.1)
EGFR: 73 mL/min/{1.73_m2} — AB (ref >90)
GLUCOSE: 129 mg/dL (ref 70–140)
Potassium: 4.1 mEq/L (ref 3.5–5.1)
SODIUM: 139 meq/L (ref 136–145)
TOTAL PROTEIN: 7 g/dL (ref 6.4–8.3)
Total Bilirubin: 0.81 mg/dL (ref 0.20–1.20)

## 2016-07-02 MED ORDER — CARFILZOMIB CHEMO INJECTION 60 MG
27.0000 mg/m2 | Freq: Once | INTRAVENOUS | Status: AC
  Administered 2016-07-02: 56 mg via INTRAVENOUS
  Filled 2016-07-02: qty 28

## 2016-07-02 MED ORDER — SODIUM CHLORIDE 0.9% FLUSH
10.0000 mL | INTRAVENOUS | Status: DC | PRN
  Administered 2016-07-02: 10 mL
  Filled 2016-07-02: qty 10

## 2016-07-02 MED ORDER — HEPARIN SOD (PORK) LOCK FLUSH 100 UNIT/ML IV SOLN
500.0000 [IU] | Freq: Once | INTRAVENOUS | Status: AC | PRN
  Administered 2016-07-02: 500 [IU]
  Filled 2016-07-02: qty 5

## 2016-07-02 MED ORDER — DEXAMETHASONE SODIUM PHOSPHATE 10 MG/ML IJ SOLN
10.0000 mg | Freq: Once | INTRAMUSCULAR | Status: AC
  Administered 2016-07-02: 10 mg via INTRAVENOUS

## 2016-07-02 MED ORDER — SODIUM CHLORIDE 0.9 % IV SOLN: Freq: Once | INTRAVENOUS | Status: AC

## 2016-07-02 MED ORDER — SODIUM CHLORIDE 0.9 % IV SOLN
300.0000 mg/m2 | Freq: Once | INTRAVENOUS | Status: AC
  Administered 2016-07-02: 620 mg via INTRAVENOUS
  Filled 2016-07-02: qty 31

## 2016-07-02 MED ORDER — PALONOSETRON HCL INJECTION 0.25 MG/5ML
0.2500 mg | Freq: Once | INTRAVENOUS | Status: AC
  Administered 2016-07-02: 0.25 mg via INTRAVENOUS

## 2016-07-02 MED ORDER — SODIUM CHLORIDE 0.9 % IV SOLN
Freq: Once | INTRAVENOUS | Status: AC
  Administered 2016-07-02: 09:00:00 via INTRAVENOUS

## 2016-07-02 MED ORDER — PALONOSETRON HCL INJECTION 0.25 MG/5ML
INTRAVENOUS | Status: AC
  Filled 2016-07-02: qty 5

## 2016-07-02 MED ORDER — DEXAMETHASONE SODIUM PHOSPHATE 10 MG/ML IJ SOLN
INTRAMUSCULAR | Status: AC
  Filled 2016-07-02: qty 1

## 2016-07-02 NOTE — Telephone Encounter (Signed)
reviewed on call note of 06/29/16-Diarrhea since Thursday,abdominal pain. Instructed to go to ED. I just tried to call pt for f/u and phone rang times 5 then stopped ringing.

## 2016-07-02 NOTE — Patient Instructions (Signed)
[facility] Discharge Instructions for Patients Receiving Chemotherapy  Today you received the following chemotherapy agents:  Kyprolis and Cytoxan.  To help prevent nausea and vomiting after your treatment, we encourage you to take your nausea medication as directed.   If you develop nausea and vomiting that is not controlled by your nausea medication, call the clinic.   BELOW ARE SYMPTOMS THAT SHOULD BE REPORTED IMMEDIATELY:  *FEVER GREATER THAN 100.5 F  *CHILLS WITH OR WITHOUT FEVER  NAUSEA AND VOMITING THAT IS NOT CONTROLLED WITH YOUR NAUSEA MEDICATION  *UNUSUAL SHORTNESS OF BREATH  *UNUSUAL BRUISING OR BLEEDING  TENDERNESS IN MOUTH AND THROAT WITH OR WITHOUT PRESENCE OF ULCERS  *URINARY PROBLEMS  *BOWEL PROBLEMS  UNUSUAL RASH Items with * indicate a potential emergency and should be followed up as soon as possible.  Feel free to call the clinic you have any questions or concerns. The clinic phone number is [phone].  Please show the CHEMO ALERT CARD at check-in to the Emergency Department and triage nurse.   

## 2016-07-03 ENCOUNTER — Ambulatory Visit (HOSPITAL_BASED_OUTPATIENT_CLINIC_OR_DEPARTMENT_OTHER): Payer: Medicare Other

## 2016-07-03 ENCOUNTER — Other Ambulatory Visit: Payer: Self-pay | Admitting: Internal Medicine

## 2016-07-03 VITALS — BP 158/91 | HR 98 | Temp 97.8°F | Resp 16

## 2016-07-03 DIAGNOSIS — Z5112 Encounter for antineoplastic immunotherapy: Secondary | ICD-10-CM | POA: Diagnosis not present

## 2016-07-03 DIAGNOSIS — C9002 Multiple myeloma in relapse: Secondary | ICD-10-CM | POA: Diagnosis not present

## 2016-07-03 MED ORDER — SODIUM CHLORIDE 0.9% FLUSH
10.0000 mL | INTRAVENOUS | Status: DC | PRN
  Administered 2016-07-03: 10 mL
  Filled 2016-07-03: qty 10

## 2016-07-03 MED ORDER — HEPARIN SOD (PORK) LOCK FLUSH 100 UNIT/ML IV SOLN
500.0000 [IU] | Freq: Once | INTRAVENOUS | Status: AC | PRN
  Administered 2016-07-03: 500 [IU]
  Filled 2016-07-03: qty 5

## 2016-07-03 MED ORDER — DEXAMETHASONE SODIUM PHOSPHATE 10 MG/ML IJ SOLN
10.0000 mg | Freq: Once | INTRAMUSCULAR | Status: AC
  Administered 2016-07-03: 10 mg via INTRAVENOUS

## 2016-07-03 MED ORDER — SODIUM CHLORIDE 0.9 % IV SOLN
Freq: Once | INTRAVENOUS | Status: AC
  Administered 2016-07-03: 09:00:00 via INTRAVENOUS

## 2016-07-03 MED ORDER — DEXAMETHASONE SODIUM PHOSPHATE 10 MG/ML IJ SOLN
INTRAMUSCULAR | Status: AC
  Filled 2016-07-03: qty 1

## 2016-07-03 MED ORDER — PALONOSETRON HCL INJECTION 0.25 MG/5ML
INTRAVENOUS | Status: AC
  Filled 2016-07-03: qty 5

## 2016-07-03 MED ORDER — SODIUM CHLORIDE 0.9 % IV SOLN: Freq: Once | INTRAVENOUS | Status: AC

## 2016-07-03 MED ORDER — DEXTROSE 5 % IV SOLN
27.0000 mg/m2 | Freq: Once | INTRAVENOUS | Status: AC
  Administered 2016-07-03: 56 mg via INTRAVENOUS
  Filled 2016-07-03: qty 28

## 2016-07-03 NOTE — Patient Instructions (Signed)
[facility] Discharge Instructions for Patients Receiving Chemotherapy  Today you received the following chemotherapy agents: Kyprolis   To help prevent nausea and vomiting after your treatment, we encourage you to take your nausea medication as directed.    If you develop nausea and vomiting that is not controlled by your nausea medication, call the clinic.   BELOW ARE SYMPTOMS THAT SHOULD BE REPORTED IMMEDIATELY:  *FEVER GREATER THAN 100.5 F  *CHILLS WITH OR WITHOUT FEVER  NAUSEA AND VOMITING THAT IS NOT CONTROLLED WITH YOUR NAUSEA MEDICATION  *UNUSUAL SHORTNESS OF BREATH  *UNUSUAL BRUISING OR BLEEDING  TENDERNESS IN MOUTH AND THROAT WITH OR WITHOUT PRESENCE OF ULCERS  *URINARY PROBLEMS  *BOWEL PROBLEMS  UNUSUAL RASH Items with * indicate a potential emergency and should be followed up as soon as possible.  Feel free to call the clinic you have any questions or concerns. The clinic phone number is [phone].  Please show the CHEMO ALERT CARD at check-in to the Emergency Department and triage nurse.   

## 2016-07-04 ENCOUNTER — Telehealth: Payer: Self-pay | Admitting: *Deleted

## 2016-07-04 DIAGNOSIS — C9000 Multiple myeloma not having achieved remission: Secondary | ICD-10-CM

## 2016-07-04 DIAGNOSIS — Z5111 Encounter for antineoplastic chemotherapy: Secondary | ICD-10-CM

## 2016-07-04 DIAGNOSIS — C9002 Multiple myeloma in relapse: Secondary | ICD-10-CM

## 2016-07-04 MED ORDER — HYDROCODONE-ACETAMINOPHEN 7.5-325 MG PO TABS: 1.0000 | ORAL_TABLET | Freq: Four times a day (QID) | ORAL | 0 refills | Status: AC | PRN

## 2016-07-04 NOTE — Telephone Encounter (Signed)
rx ready 

## 2016-07-04 NOTE — Telephone Encounter (Signed)
I was there yesterday and forgot to ask for a refill on the Hydrocodone 7-325 mg.  Please call me today at [phone] when ready for pick up."

## 2016-07-04 NOTE — Addendum Note (Signed)
Addended by: Ardeen Garland on: 07/04/2016 01:08 PM   Modules accepted: Orders

## 2016-07-05 ENCOUNTER — Other Ambulatory Visit: Payer: Self-pay | Admitting: Internal Medicine

## 2016-07-08 ENCOUNTER — Other Ambulatory Visit (HOSPITAL_BASED_OUTPATIENT_CLINIC_OR_DEPARTMENT_OTHER): Payer: Medicare Other

## 2016-07-08 ENCOUNTER — Ambulatory Visit (HOSPITAL_BASED_OUTPATIENT_CLINIC_OR_DEPARTMENT_OTHER): Payer: Medicare Other

## 2016-07-08 VITALS — BP 136/65 | HR 100 | Temp 98.7°F | Resp 18

## 2016-07-08 DIAGNOSIS — Z5111 Encounter for antineoplastic chemotherapy: Secondary | ICD-10-CM | POA: Diagnosis not present

## 2016-07-08 DIAGNOSIS — Z5112 Encounter for antineoplastic immunotherapy: Secondary | ICD-10-CM | POA: Diagnosis not present

## 2016-07-08 DIAGNOSIS — C9002 Multiple myeloma in relapse: Secondary | ICD-10-CM

## 2016-07-08 LAB — COMPREHENSIVE METABOLIC PANEL
ALT: 15 U/L (ref 0–55)
ANION GAP: 10 meq/L (ref 3–11)
AST: 18 U/L (ref 5–34)
Albumin: 3.6 g/dL (ref 3.5–5.0)
Alkaline Phosphatase: 72 U/L (ref 40–150)
BILIRUBIN TOTAL: 1.08 mg/dL (ref 0.20–1.20)
BUN: 12 mg/dL (ref 7.0–26.0)
CALCIUM: 9.4 mg/dL (ref 8.4–10.4)
CO2: 24 meq/L (ref 22–29)
CREATININE: 1 mg/dL (ref 0.6–1.1)
Chloride: 102 mEq/L (ref 98–109)
EGFR: 65 mL/min/{1.73_m2} — ABNORMAL LOW (ref >90)
Glucose: 105 mg/dl (ref 70–140)
Potassium: 4.1 mEq/L (ref 3.5–5.1)
Sodium: 136 mEq/L (ref 136–145)
TOTAL PROTEIN: 7.2 g/dL (ref 6.4–8.3)

## 2016-07-08 LAB — CBC WITH DIFFERENTIAL/PLATELET
BASO%: 0 % (ref 0.0–2.0)
Basophils Absolute: 0 10*3/uL (ref 0.0–0.1)
EOS%: 0 % (ref 0.0–7.0)
Eosinophils Absolute: 0 10*3/uL (ref 0.0–0.5)
HEMATOCRIT: 32.6 % — AB (ref 34.8–46.6)
HEMOGLOBIN: 10.5 g/dL — AB (ref 11.6–15.9)
LYMPH#: 0.3 10*3/uL — AB (ref 0.9–3.3)
LYMPH%: 5.9 % — ABNORMAL LOW (ref 14.0–49.7)
MCH: 34.4 pg — ABNORMAL HIGH (ref 25.1–34.0)
MCHC: 32.2 g/dL (ref 31.5–36.0)
MCV: 106.9 fL — ABNORMAL HIGH (ref 79.5–101.0)
MONO#: 0.3 10*3/uL (ref 0.1–0.9)
MONO%: 5 % (ref 0.0–14.0)
NEUT%: 89.1 % — ABNORMAL HIGH (ref 38.4–76.8)
NEUTROS ABS: 5 10*3/uL (ref 1.5–6.5)
PLATELETS: 70 10*3/uL — AB (ref 145–400)
RBC: 3.05 10*6/uL — ABNORMAL LOW (ref 3.70–5.45)
RDW: 15.6 % — AB (ref 11.2–14.5)
WBC: 5.6 10*3/uL (ref 3.9–10.3)

## 2016-07-08 MED ORDER — SODIUM CHLORIDE 0.9 % IV SOLN
Freq: Once | INTRAVENOUS | Status: AC
  Administered 2016-07-08: 10:00:00 via INTRAVENOUS

## 2016-07-08 MED ORDER — OXYCODONE-ACETAMINOPHEN 5-325 MG PO TABS
ORAL_TABLET | ORAL | Status: AC
  Filled 2016-07-08: qty 1

## 2016-07-08 MED ORDER — SODIUM CHLORIDE 0.9 % IV SOLN
300.0000 mg/m2 | Freq: Once | INTRAVENOUS | Status: AC
  Administered 2016-07-08: 620 mg via INTRAVENOUS
  Filled 2016-07-08: qty 31

## 2016-07-08 MED ORDER — HEPARIN SOD (PORK) LOCK FLUSH 100 UNIT/ML IV SOLN
500.0000 [IU] | Freq: Once | INTRAVENOUS | Status: AC | PRN
  Administered 2016-07-08: 500 [IU]
  Filled 2016-07-08: qty 5

## 2016-07-08 MED ORDER — OXYCODONE-ACETAMINOPHEN 5-325 MG PO TABS
1.0000 | ORAL_TABLET | Freq: Once | ORAL | Status: AC
  Administered 2016-07-08: 1 via ORAL

## 2016-07-08 MED ORDER — DEXAMETHASONE SODIUM PHOSPHATE 10 MG/ML IJ SOLN
10.0000 mg | Freq: Once | INTRAMUSCULAR | Status: AC
  Administered 2016-07-08: 10 mg via INTRAVENOUS

## 2016-07-08 MED ORDER — PALONOSETRON HCL INJECTION 0.25 MG/5ML
0.2500 mg | Freq: Once | INTRAVENOUS | Status: AC
  Administered 2016-07-08: 0.25 mg via INTRAVENOUS

## 2016-07-08 MED ORDER — PALONOSETRON HCL INJECTION 0.25 MG/5ML
INTRAVENOUS | Status: AC
  Filled 2016-07-08: qty 5

## 2016-07-08 MED ORDER — DEXTROSE 5 % IV SOLN
27.0000 mg/m2 | Freq: Once | INTRAVENOUS | Status: AC
  Administered 2016-07-08: 56 mg via INTRAVENOUS
  Filled 2016-07-08: qty 28

## 2016-07-08 MED ORDER — DEXAMETHASONE SODIUM PHOSPHATE 10 MG/ML IJ SOLN
INTRAMUSCULAR | Status: AC
  Filled 2016-07-08: qty 1

## 2016-07-08 MED ORDER — SODIUM CHLORIDE 0.9% FLUSH
10.0000 mL | INTRAVENOUS | Status: DC | PRN
  Administered 2016-07-08: 10 mL
  Filled 2016-07-08: qty 10

## 2016-07-08 NOTE — Progress Notes (Signed)
Per Dr. Julien Nordmann okay to treat today with platelets of 70. Dr. Julien Nordmann notified of patient's 9/10 neck pain; per Dr. Julien Nordmann patient mat receive 1 percocet today and patient is to take pain medication before coming in for treatment in the future. Patient educated and heat packs given. Patient verbalized understanding.

## 2016-07-08 NOTE — Patient Instructions (Signed)
[facility] Discharge Instructions for Patients Receiving Chemotherapy  Today you received the following chemotherapy agents Cytoxan and Kyprolis  To help prevent nausea and vomiting after your treatment, we encourage you to take your nausea medication as directed   If you develop nausea and vomiting that is not controlled by your nausea medication, call the clinic.   BELOW ARE SYMPTOMS THAT SHOULD BE REPORTED IMMEDIATELY:  *FEVER GREATER THAN 100.5 F  *CHILLS WITH OR WITHOUT FEVER  NAUSEA AND VOMITING THAT IS NOT CONTROLLED WITH YOUR NAUSEA MEDICATION  *UNUSUAL SHORTNESS OF BREATH  *UNUSUAL BRUISING OR BLEEDING  TENDERNESS IN MOUTH AND THROAT WITH OR WITHOUT PRESENCE OF ULCERS  *URINARY PROBLEMS  *BOWEL PROBLEMS  UNUSUAL RASH Items with * indicate a potential emergency and should be followed up as soon as possible.  Feel free to call the clinic you have any questions or concerns. The clinic phone number is [phone].  Please show the CHEMO ALERT CARD at check-in to the Emergency Department and triage nurse.   

## 2016-07-09 ENCOUNTER — Ambulatory Visit (HOSPITAL_BASED_OUTPATIENT_CLINIC_OR_DEPARTMENT_OTHER): Payer: Medicare Other

## 2016-07-09 VITALS — BP 122/64 | HR 98 | Temp 97.7°F | Resp 18

## 2016-07-09 DIAGNOSIS — Z5112 Encounter for antineoplastic immunotherapy: Secondary | ICD-10-CM

## 2016-07-09 DIAGNOSIS — C9002 Multiple myeloma in relapse: Secondary | ICD-10-CM

## 2016-07-09 MED ORDER — SODIUM CHLORIDE 0.9% FLUSH
10.0000 mL | INTRAVENOUS | Status: DC | PRN
  Administered 2016-07-09: 10 mL
  Filled 2016-07-09: qty 10

## 2016-07-09 MED ORDER — DEXAMETHASONE SODIUM PHOSPHATE 10 MG/ML IJ SOLN
10.0000 mg | Freq: Once | INTRAMUSCULAR | Status: AC
  Administered 2016-07-09: 10 mg via INTRAVENOUS

## 2016-07-09 MED ORDER — HEPARIN SOD (PORK) LOCK FLUSH 100 UNIT/ML IV SOLN
500.0000 [IU] | Freq: Once | INTRAVENOUS | Status: AC | PRN
  Administered 2016-07-09: 500 [IU]
  Filled 2016-07-09: qty 5

## 2016-07-09 MED ORDER — DEXAMETHASONE SODIUM PHOSPHATE 10 MG/ML IJ SOLN
INTRAMUSCULAR | Status: AC
  Filled 2016-07-09: qty 1

## 2016-07-09 MED ORDER — SODIUM CHLORIDE 0.9 % IV SOLN
Freq: Once | INTRAVENOUS | Status: AC
  Administered 2016-07-09: 09:00:00 via INTRAVENOUS

## 2016-07-09 MED ORDER — DEXTROSE 5 % IV SOLN
27.0000 mg/m2 | Freq: Once | INTRAVENOUS | Status: AC
  Administered 2016-07-09: 56 mg via INTRAVENOUS
  Filled 2016-07-09: qty 28

## 2016-07-09 NOTE — Patient Instructions (Signed)
[facility] Discharge Instructions for Patients Receiving Chemotherapy  Today you received the following chemotherapy agents: Kyprolis   To help prevent nausea and vomiting after your treatment, we encourage you to take your nausea medication as directed.    If you develop nausea and vomiting that is not controlled by your nausea medication, call the clinic.   BELOW ARE SYMPTOMS THAT SHOULD BE REPORTED IMMEDIATELY:  *FEVER GREATER THAN 100.5 F  *CHILLS WITH OR WITHOUT FEVER  NAUSEA AND VOMITING THAT IS NOT CONTROLLED WITH YOUR NAUSEA MEDICATION  *UNUSUAL SHORTNESS OF BREATH  *UNUSUAL BRUISING OR BLEEDING  TENDERNESS IN MOUTH AND THROAT WITH OR WITHOUT PRESENCE OF ULCERS  *URINARY PROBLEMS  *BOWEL PROBLEMS  UNUSUAL RASH Items with * indicate a potential emergency and should be followed up as soon as possible.  Feel free to call the clinic you have any questions or concerns. The clinic phone number is [phone].  Please show the CHEMO ALERT CARD at check-in to the Emergency Department and triage nurse.   

## 2016-07-10 ENCOUNTER — Other Ambulatory Visit: Payer: Self-pay | Admitting: Internal Medicine

## 2016-07-12 ENCOUNTER — Telehealth: Payer: Self-pay | Admitting: Medical Oncology

## 2016-07-12 NOTE — Telephone Encounter (Signed)
Called dtr to express our condolences . [name] was found dead yesterday at home.

## 2016-07-15 ENCOUNTER — Other Ambulatory Visit: Payer: Medicare Other

## 2016-07-22 ENCOUNTER — Other Ambulatory Visit: Payer: Self-pay | Admitting: Internal Medicine

## 2016-07-22 ENCOUNTER — Other Ambulatory Visit: Payer: Medicare Other

## 2016-07-23 ENCOUNTER — Other Ambulatory Visit: Payer: Medicare Other

## 2016-07-23 ENCOUNTER — Ambulatory Visit: Payer: Medicare Other

## 2016-07-23 ENCOUNTER — Ambulatory Visit: Payer: Medicare Other | Admitting: Internal Medicine

## 2016-07-24 ENCOUNTER — Ambulatory Visit: Payer: Medicare Other

## 2016-07-29 ENCOUNTER — Other Ambulatory Visit: Payer: Medicare Other

## 2016-07-30 ENCOUNTER — Ambulatory Visit: Payer: Medicare Other

## 2016-07-30 ENCOUNTER — Encounter: Payer: Self-pay | Admitting: Internal Medicine

## 2016-07-31 ENCOUNTER — Ambulatory Visit: Payer: Medicare Other

## 2016-08-01 DIAGNOSIS — 419620001 Death: Secondary | SNOMED CT | POA: Diagnosis not present

## 2016-08-01 DEATH — deceased

## 2016-08-05 ENCOUNTER — Other Ambulatory Visit: Payer: Medicare Other

## 2016-08-06 ENCOUNTER — Ambulatory Visit: Payer: Medicare Other

## 2016-08-07 ENCOUNTER — Ambulatory Visit: Payer: Medicare Other

## 2016-08-08 ENCOUNTER — Other Ambulatory Visit: Payer: Self-pay | Admitting: Internal Medicine

## 2016-08-12 ENCOUNTER — Other Ambulatory Visit: Payer: Medicare Other

## 2016-08-19 ENCOUNTER — Other Ambulatory Visit: Payer: Medicare Other

## 2016-08-20 ENCOUNTER — Ambulatory Visit: Payer: Medicare Other | Admitting: Internal Medicine

## 2016-08-20 ENCOUNTER — Other Ambulatory Visit: Payer: Medicare Other

## 2016-08-26 ENCOUNTER — Other Ambulatory Visit: Payer: Medicare Other

## 2016-09-02 ENCOUNTER — Other Ambulatory Visit: Payer: Medicare Other

## 2016-09-09 ENCOUNTER — Other Ambulatory Visit: Payer: Medicare Other

## 2016-09-16 ENCOUNTER — Other Ambulatory Visit: Payer: Medicare Other

## 2016-09-17 ENCOUNTER — Other Ambulatory Visit: Payer: Medicare Other

## 2016-09-17 ENCOUNTER — Ambulatory Visit: Payer: Medicare Other | Admitting: Internal Medicine

## 2016-09-23 ENCOUNTER — Other Ambulatory Visit: Payer: Medicare Other

## 2016-09-30 ENCOUNTER — Other Ambulatory Visit: Payer: Medicare Other

## 2016-10-07 ENCOUNTER — Other Ambulatory Visit: Payer: Medicare Other

## 2016-10-10 ENCOUNTER — Other Ambulatory Visit: Payer: Self-pay | Admitting: Nurse Practitioner

## 2016-10-14 ENCOUNTER — Other Ambulatory Visit: Payer: Medicare Other

## 2016-10-15 ENCOUNTER — Ambulatory Visit: Payer: Medicare Other | Admitting: Internal Medicine

## 2016-10-15 ENCOUNTER — Other Ambulatory Visit: Payer: Medicare Other

## 2016-10-18 ENCOUNTER — Ambulatory Visit: Payer: Self-pay | Admitting: Internal Medicine

## 2017-03-17 ENCOUNTER — Encounter: Payer: Self-pay | Admitting: Internal Medicine

## 2017-07-11 IMAGING — CR DG CHEST 2V
2 series · 2 of 2 positions shown · non-contrast
Comparison: 11/09/2014

CLINICAL DATA: Chest pain. Shortness of breath and dizziness.
Cough.

EXAM:
CHEST  2 VIEW

[w chest pa]
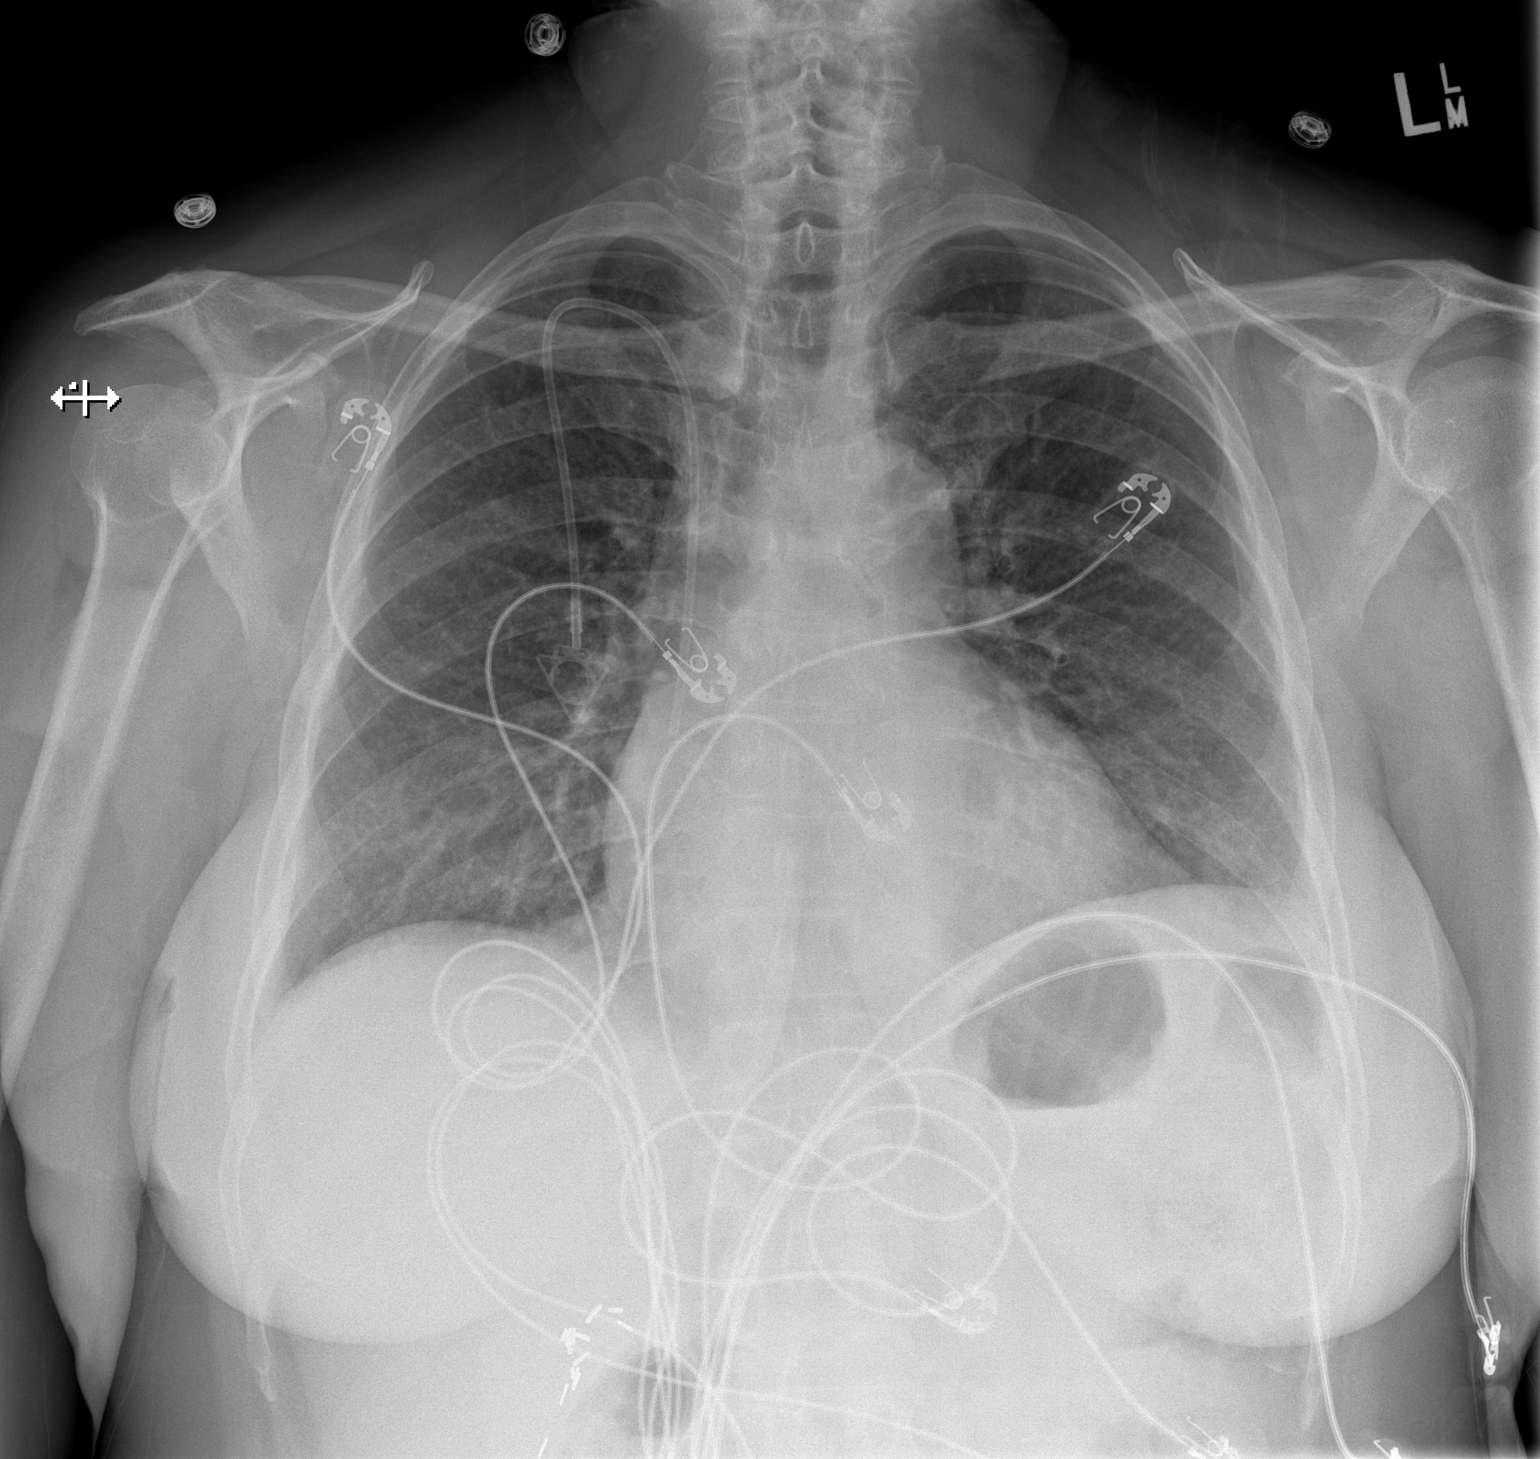

[w chest lat]
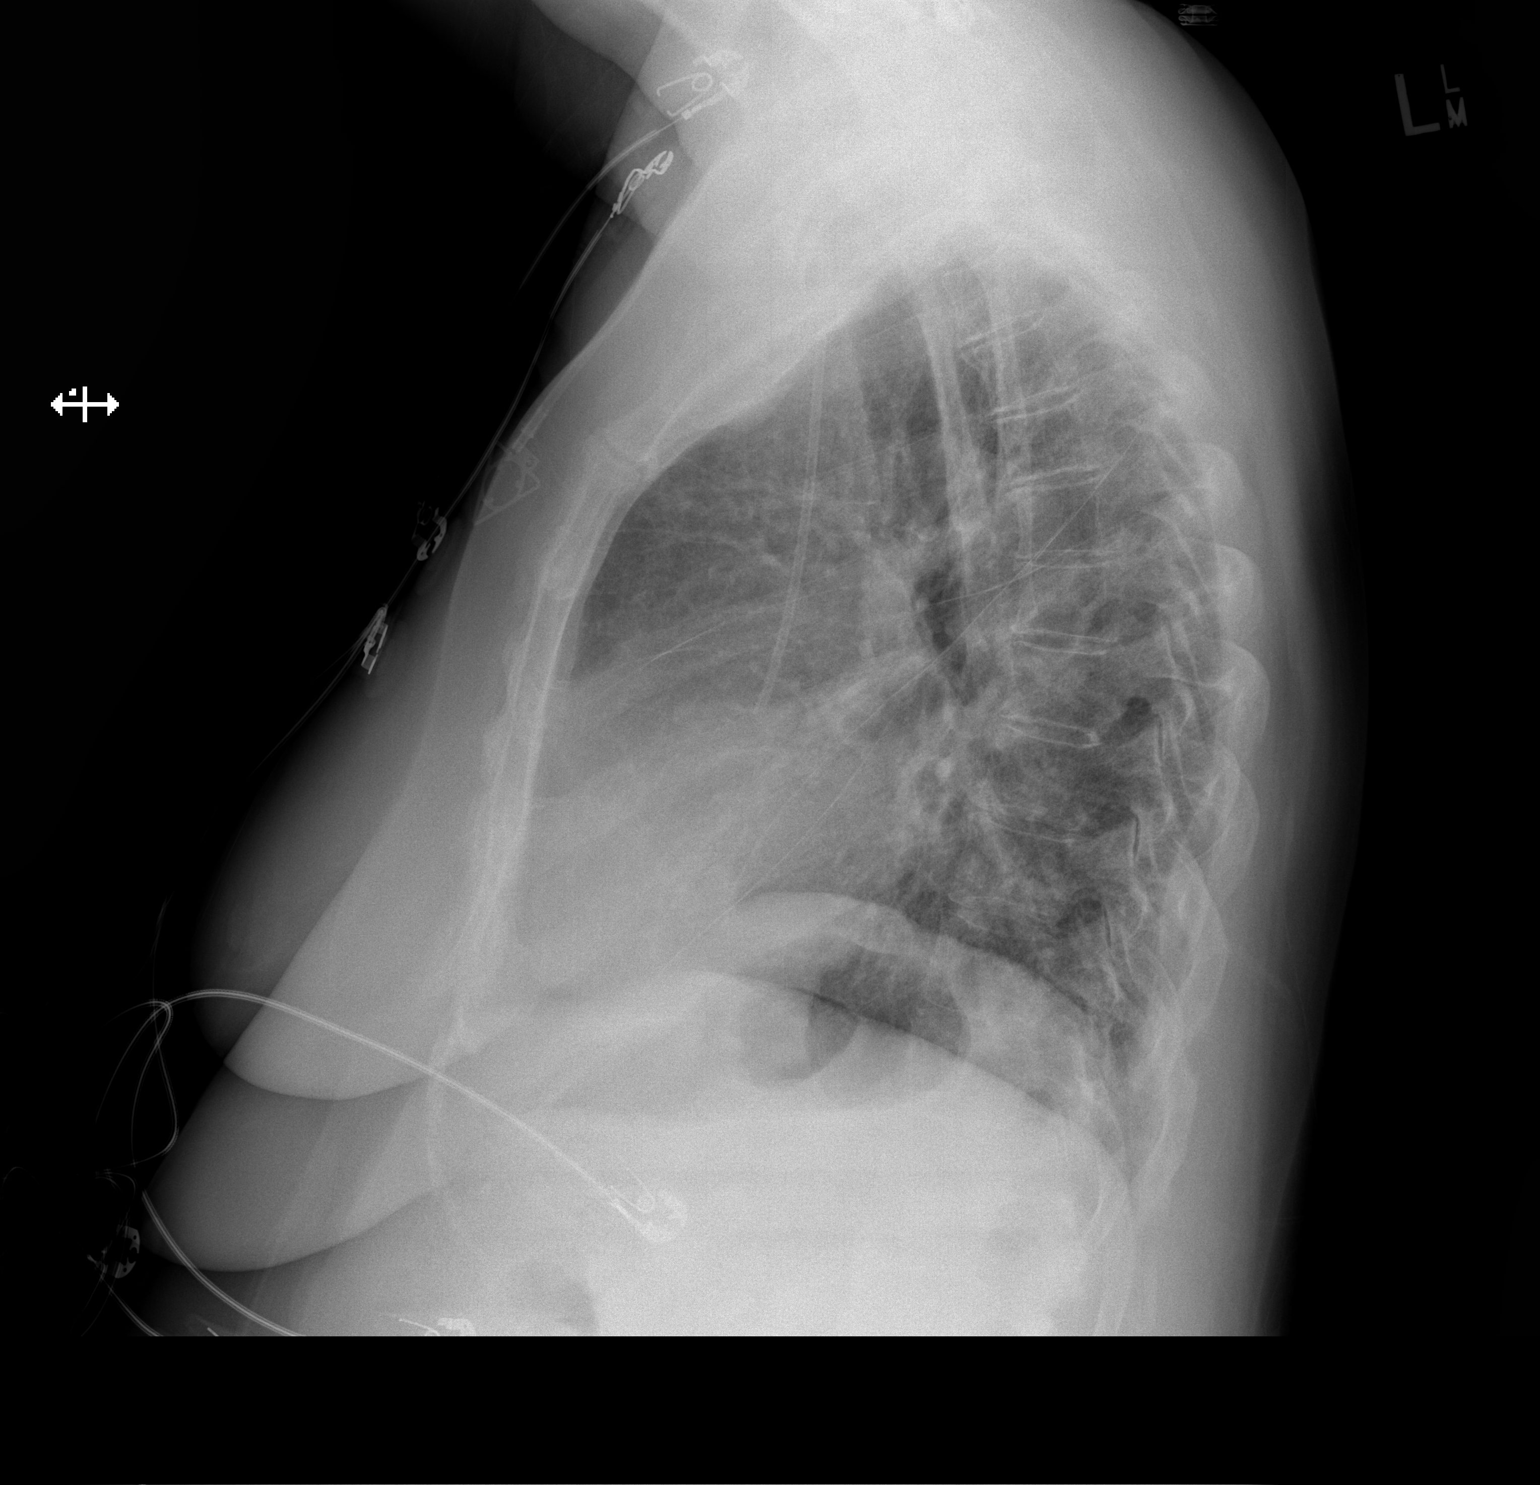

[2 of 2 positions shown; findings below may reference images not displayed]

FINDINGS: There is a right chest wall port a catheter with tip in the
cavoatrial junction. Heart size is normal. No pleural effusion or
edema. No airspace consolidation identified.
IMPRESSION: 1. No acute cardiopulmonary abnormalities.

## 2018-08-10 IMAGING — DX DG CHEST 2V
3 series · 3 of 3 positions shown · non-contrast
Comparison: 03/20/2016

CLINICAL DATA: Multiple myeloma. Productive cough, congestion, and
shortness of breath. Former smoker.

EXAM:
CHEST  2 VIEW

[chest pa]
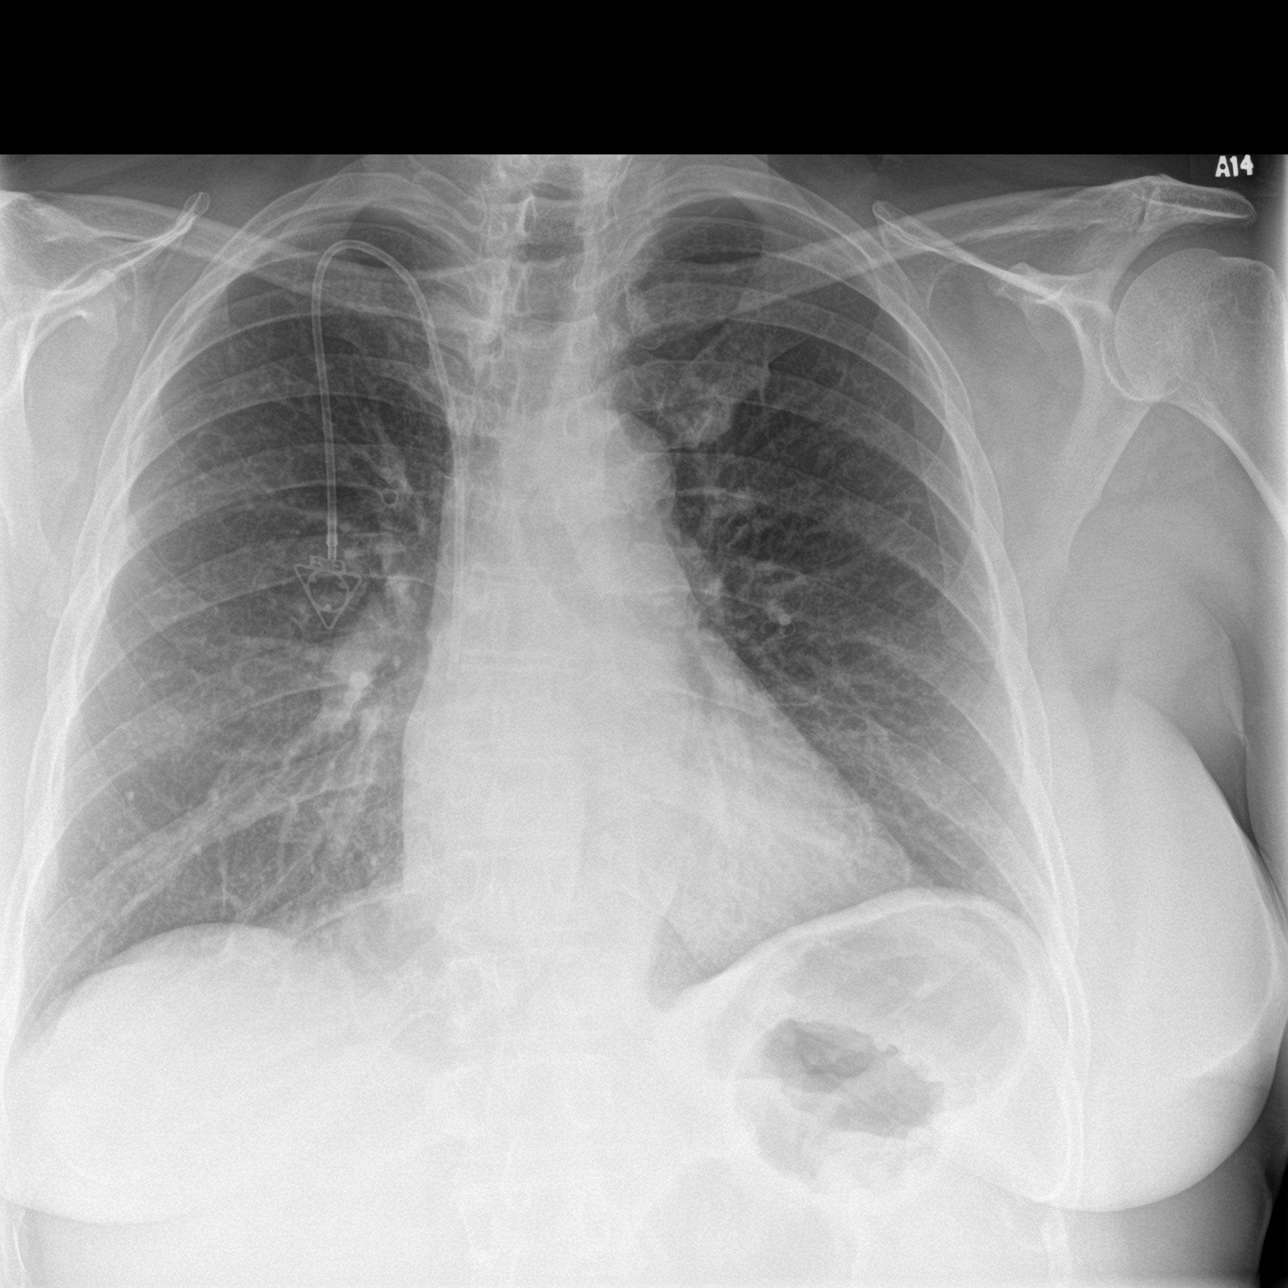

[chest lat (1 of 2)]
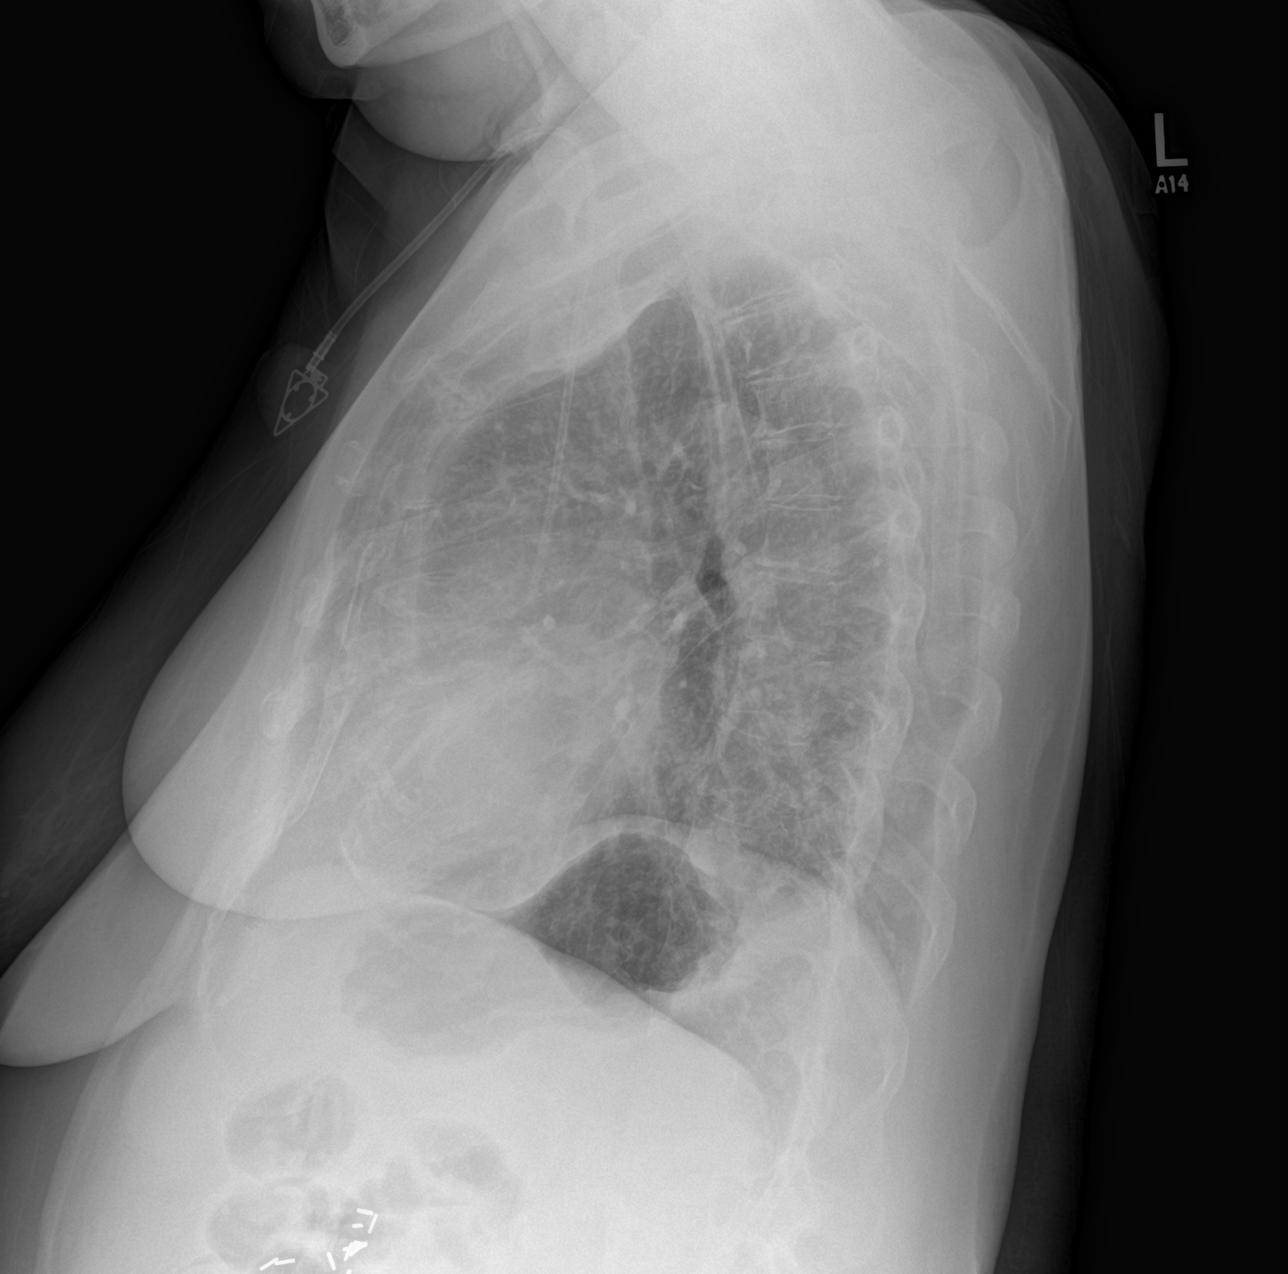

[chest lat (2 of 2)]
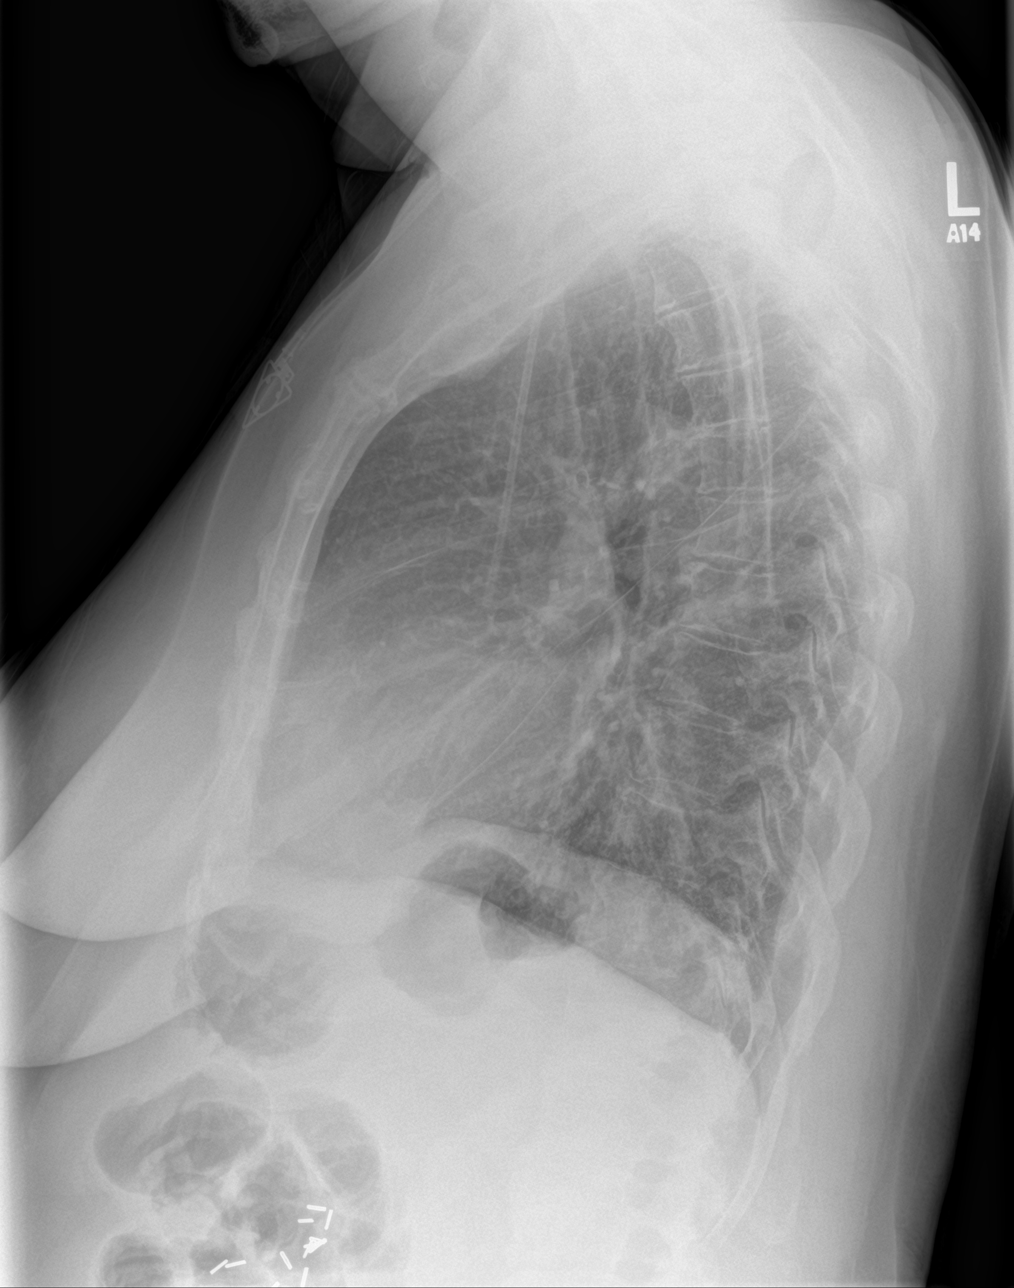

[3 of 3 positions shown; findings below may reference images not displayed]

FINDINGS: Right jugular Port-A-Cath terminates over the lower SVC, unchanged.
Cardiomediastinal silhouette is within normal limits. The patient
has taken a shallower inspiration than on the prior study,
particularly on the lateral radiographs. No confluent airspace
opacity, edema, pleural effusion, or pneumothorax is identified. No
acute osseous abnormality is seen.
IMPRESSION: No active cardiopulmonary disease.
